# Patient Record
Sex: Female | Born: 1960
Health system: Southern US, Community
[De-identification: ages and names within clinical notes are randomized; demographics above are authoritative.]

## PROBLEM LIST (undated history)

## (undated) ENCOUNTER — Emergency Department (HOSPITAL_COMMUNITY): Payer: Self-pay

## (undated) DIAGNOSIS — G8929 Other chronic pain: Secondary | ICD-10-CM

## (undated) DIAGNOSIS — F319 Bipolar disorder, unspecified: Secondary | ICD-10-CM

## (undated) DIAGNOSIS — J449 Chronic obstructive pulmonary disease, unspecified: Secondary | ICD-10-CM

## (undated) DIAGNOSIS — G4730 Sleep apnea, unspecified: Secondary | ICD-10-CM

## (undated) DIAGNOSIS — E039 Hypothyroidism, unspecified: Secondary | ICD-10-CM

## (undated) DIAGNOSIS — M1990 Unspecified osteoarthritis, unspecified site: Secondary | ICD-10-CM

## (undated) DIAGNOSIS — F32A Depression, unspecified: Secondary | ICD-10-CM

## (undated) DIAGNOSIS — Z973 Presence of spectacles and contact lenses: Secondary | ICD-10-CM

## (undated) DIAGNOSIS — N644 Mastodynia: Secondary | ICD-10-CM

## (undated) DIAGNOSIS — A498 Other bacterial infections of unspecified site: Secondary | ICD-10-CM

## (undated) DIAGNOSIS — E079 Disorder of thyroid, unspecified: Secondary | ICD-10-CM

## (undated) DIAGNOSIS — G629 Polyneuropathy, unspecified: Secondary | ICD-10-CM

## (undated) DIAGNOSIS — M5430 Sciatica, unspecified side: Secondary | ICD-10-CM

## (undated) DIAGNOSIS — Z96659 Presence of unspecified artificial knee joint: Secondary | ICD-10-CM

## (undated) DIAGNOSIS — F419 Anxiety disorder, unspecified: Secondary | ICD-10-CM

## (undated) DIAGNOSIS — F329 Major depressive disorder, single episode, unspecified: Secondary | ICD-10-CM

## (undated) DIAGNOSIS — R112 Nausea with vomiting, unspecified: Secondary | ICD-10-CM

## (undated) DIAGNOSIS — N6452 Nipple discharge: Secondary | ICD-10-CM

## (undated) DIAGNOSIS — R509 Fever, unspecified: Secondary | ICD-10-CM

## (undated) DIAGNOSIS — K219 Gastro-esophageal reflux disease without esophagitis: Secondary | ICD-10-CM

## (undated) DIAGNOSIS — I499 Cardiac arrhythmia, unspecified: Secondary | ICD-10-CM

## (undated) DIAGNOSIS — Z87442 Personal history of urinary calculi: Secondary | ICD-10-CM

## (undated) DIAGNOSIS — J189 Pneumonia, unspecified organism: Secondary | ICD-10-CM

## (undated) DIAGNOSIS — I10 Essential (primary) hypertension: Secondary | ICD-10-CM

## (undated) DIAGNOSIS — N63 Unspecified lump in unspecified breast: Secondary | ICD-10-CM

## (undated) DIAGNOSIS — Z87898 Personal history of other specified conditions: Secondary | ICD-10-CM

## (undated) HISTORY — DX: Depression, unspecified: F32.A

## (undated) HISTORY — DX: Disorder of thyroid, unspecified: E07.9

## (undated) HISTORY — DX: Essential (primary) hypertension: I10

## (undated) HISTORY — PX: APPENDECTOMY: SHX54

## (undated) HISTORY — DX: Bipolar disorder, unspecified: F31.9

## (undated) HISTORY — PX: ABDOMINAL HYSTERECTOMY: SHX81

## (undated) HISTORY — PX: BREAST EXCISIONAL BIOPSY: SUR124

## (undated) HISTORY — DX: Unspecified lump in unspecified breast: N63.0

## (undated) HISTORY — PX: JOINT REPLACEMENT: SHX530

## (undated) HISTORY — DX: Fever, unspecified: R50.9

## (undated) HISTORY — PX: KNEE SURGERY: SHX244

## (undated) HISTORY — DX: Nipple discharge: N64.52

## (undated) HISTORY — DX: Major depressive disorder, single episode, unspecified: F32.9

## (undated) HISTORY — PX: CARDIAC CATHETERIZATION: SHX172

## (undated) HISTORY — DX: Mastodynia: N64.4

## (undated) HISTORY — DX: Sleep apnea, unspecified: G47.30

## (undated) HISTORY — DX: Unspecified osteoarthritis, unspecified site: M19.90

## (undated) HISTORY — PX: BREAST LUMPECTOMY: SHX2

## (undated) HISTORY — PX: MOUTH SURGERY: SHX715

## (undated) HISTORY — PX: HERNIA REPAIR: SHX51

---

## 1987-06-15 HISTORY — PX: MYOMECTOMY ABDOMINAL APPROACH: SUR870

## 2005-04-12 ENCOUNTER — Ambulatory Visit: Payer: Self-pay | Admitting: Family Medicine

## 2006-01-24 ENCOUNTER — Encounter: Admission: RE | Admit: 2006-01-24 | Discharge: 2006-01-24 | Payer: Self-pay | Admitting: Obstetrics & Gynecology

## 2006-02-18 ENCOUNTER — Ambulatory Visit: Payer: Self-pay | Admitting: Family Medicine

## 2006-04-10 ENCOUNTER — Emergency Department (HOSPITAL_COMMUNITY): Admission: EM | Admit: 2006-04-10 | Discharge: 2006-04-10 | Payer: Self-pay | Admitting: Emergency Medicine

## 2006-04-15 ENCOUNTER — Ambulatory Visit: Payer: Self-pay | Admitting: Family Medicine

## 2006-05-17 ENCOUNTER — Ambulatory Visit: Payer: Self-pay | Admitting: Family Medicine

## 2006-06-18 ENCOUNTER — Emergency Department (HOSPITAL_COMMUNITY): Admission: EM | Admit: 2006-06-18 | Discharge: 2006-06-19 | Payer: Self-pay | Admitting: Emergency Medicine

## 2006-07-07 ENCOUNTER — Emergency Department (HOSPITAL_COMMUNITY): Admission: EM | Admit: 2006-07-07 | Discharge: 2006-07-07 | Payer: Self-pay | Admitting: Emergency Medicine

## 2006-10-17 ENCOUNTER — Other Ambulatory Visit: Admission: RE | Admit: 2006-10-17 | Discharge: 2006-10-17 | Payer: Self-pay | Admitting: *Deleted

## 2006-11-07 ENCOUNTER — Inpatient Hospital Stay (HOSPITAL_COMMUNITY): Admission: EM | Admit: 2006-11-07 | Discharge: 2006-11-08 | Payer: Self-pay | Admitting: Emergency Medicine

## 2007-09-07 ENCOUNTER — Encounter: Admission: RE | Admit: 2007-09-07 | Discharge: 2007-09-07 | Payer: Self-pay | Admitting: Family Medicine

## 2007-10-31 ENCOUNTER — Emergency Department (HOSPITAL_COMMUNITY): Admission: EM | Admit: 2007-10-31 | Discharge: 2007-10-31 | Payer: Self-pay | Admitting: Emergency Medicine

## 2008-04-28 ENCOUNTER — Inpatient Hospital Stay (HOSPITAL_COMMUNITY): Admission: EM | Admit: 2008-04-28 | Discharge: 2008-04-28 | Payer: Self-pay | Admitting: *Deleted

## 2008-04-28 ENCOUNTER — Ambulatory Visit: Payer: Self-pay | Admitting: Internal Medicine

## 2008-07-04 ENCOUNTER — Emergency Department (HOSPITAL_COMMUNITY): Admission: EM | Admit: 2008-07-04 | Discharge: 2008-07-05 | Payer: Self-pay | Admitting: Emergency Medicine

## 2008-08-13 ENCOUNTER — Ambulatory Visit (HOSPITAL_COMMUNITY): Admission: RE | Admit: 2008-08-13 | Discharge: 2008-08-13 | Payer: Self-pay | Admitting: Orthopedic Surgery

## 2008-09-03 ENCOUNTER — Emergency Department (HOSPITAL_COMMUNITY): Admission: EM | Admit: 2008-09-03 | Discharge: 2008-09-03 | Payer: Self-pay | Admitting: Emergency Medicine

## 2008-09-21 ENCOUNTER — Emergency Department (HOSPITAL_COMMUNITY): Admission: EM | Admit: 2008-09-21 | Discharge: 2008-09-21 | Payer: Self-pay | Admitting: Internal Medicine

## 2008-10-08 ENCOUNTER — Inpatient Hospital Stay (HOSPITAL_COMMUNITY): Admission: RE | Admit: 2008-10-08 | Discharge: 2008-10-11 | Payer: Self-pay | Admitting: Orthopedic Surgery

## 2008-12-05 ENCOUNTER — Encounter: Admission: RE | Admit: 2008-12-05 | Discharge: 2009-02-27 | Payer: Self-pay | Admitting: Orthopedic Surgery

## 2009-08-18 ENCOUNTER — Emergency Department (HOSPITAL_COMMUNITY): Admission: EM | Admit: 2009-08-18 | Discharge: 2009-08-18 | Payer: Self-pay | Admitting: Family Medicine

## 2009-10-30 ENCOUNTER — Ambulatory Visit (HOSPITAL_COMMUNITY)
Admission: RE | Admit: 2009-10-30 | Discharge: 2009-10-30 | Payer: Self-pay | Source: Home / Self Care | Admitting: Gastroenterology

## 2009-11-18 ENCOUNTER — Encounter: Admission: RE | Admit: 2009-11-18 | Discharge: 2009-11-18 | Payer: Self-pay | Admitting: Family Medicine

## 2009-12-16 ENCOUNTER — Emergency Department (HOSPITAL_COMMUNITY): Admission: EM | Admit: 2009-12-16 | Discharge: 2009-12-16 | Payer: Self-pay | Admitting: Family Medicine

## 2010-03-19 ENCOUNTER — Encounter
Admission: RE | Admit: 2010-03-19 | Discharge: 2010-05-27 | Payer: Self-pay | Source: Home / Self Care | Attending: Podiatry | Admitting: Podiatry

## 2010-05-21 ENCOUNTER — Emergency Department (HOSPITAL_COMMUNITY)
Admission: EM | Admit: 2010-05-21 | Discharge: 2010-05-21 | Payer: Self-pay | Source: Home / Self Care | Admitting: Emergency Medicine

## 2010-08-04 ENCOUNTER — Other Ambulatory Visit (HOSPITAL_COMMUNITY): Payer: Self-pay | Admitting: Orthopedic Surgery

## 2010-08-04 DIAGNOSIS — M25561 Pain in right knee: Secondary | ICD-10-CM

## 2010-08-10 ENCOUNTER — Ambulatory Visit (HOSPITAL_COMMUNITY)
Admission: RE | Admit: 2010-08-10 | Discharge: 2010-08-10 | Disposition: A | Discharge status: Home / Self Care | Payer: MEDICARE | Source: Ambulatory Visit | Attending: Orthopedic Surgery | Admitting: Orthopedic Surgery

## 2010-08-10 ENCOUNTER — Encounter (HOSPITAL_COMMUNITY)
Admission: RE | Admit: 2010-08-10 | Discharge: 2010-08-10 | Disposition: A | Discharge status: Home / Self Care | Payer: MEDICARE | Source: Ambulatory Visit | Attending: Orthopedic Surgery | Admitting: Orthopedic Surgery

## 2010-08-10 ENCOUNTER — Encounter (HOSPITAL_COMMUNITY): Payer: Self-pay

## 2010-08-10 DIAGNOSIS — M25561 Pain in right knee: Secondary | ICD-10-CM

## 2010-08-10 DIAGNOSIS — Z96659 Presence of unspecified artificial knee joint: Secondary | ICD-10-CM | POA: Insufficient documentation

## 2010-08-10 HISTORY — DX: Presence of unspecified artificial knee joint: Z96.659

## 2010-08-10 MED ORDER — TECHNETIUM TC 99M MEDRONATE IV KIT
24.0000 | PACK | Freq: Once | INTRAVENOUS | Status: AC | PRN
  Administered 2010-08-10: 24 via INTRAVENOUS

## 2010-08-25 LAB — GLUCOSE, CAPILLARY: Glucose-Capillary: 128 mg/dL — ABNORMAL HIGH (ref 70–99)

## 2010-08-31 LAB — GLUCOSE, CAPILLARY: Glucose-Capillary: 82 mg/dL (ref 70–99)

## 2010-09-23 LAB — CULTURE, ROUTINE-ABSCESS

## 2010-09-23 LAB — GLUCOSE, CAPILLARY
Glucose-Capillary: 113 mg/dL — ABNORMAL HIGH (ref 70–99)
Glucose-Capillary: 121 mg/dL — ABNORMAL HIGH (ref 70–99)
Glucose-Capillary: 127 mg/dL — ABNORMAL HIGH (ref 70–99)
Glucose-Capillary: 133 mg/dL — ABNORMAL HIGH (ref 70–99)
Glucose-Capillary: 134 mg/dL — ABNORMAL HIGH (ref 70–99)
Glucose-Capillary: 134 mg/dL — ABNORMAL HIGH (ref 70–99)
Glucose-Capillary: 145 mg/dL — ABNORMAL HIGH (ref 70–99)

## 2010-09-23 LAB — URINALYSIS, ROUTINE W REFLEX MICROSCOPIC
Bilirubin Urine: NEGATIVE
Glucose, UA: NEGATIVE mg/dL
Hgb urine dipstick: NEGATIVE
Ketones, ur: 15 mg/dL — AB
Nitrite: NEGATIVE
Protein, ur: NEGATIVE mg/dL
Specific Gravity, Urine: 1.028 (ref 1.005–1.030)
Urobilinogen, UA: 1 mg/dL (ref 0.0–1.0)
pH: 5.5 (ref 5.0–8.0)

## 2010-09-23 LAB — BASIC METABOLIC PANEL
BUN: 4 mg/dL — ABNORMAL LOW (ref 6–23)
BUN: 6 mg/dL (ref 6–23)
BUN: 7 mg/dL (ref 6–23)
BUN: 8 mg/dL (ref 6–23)
CO2: 21 mEq/L (ref 19–32)
CO2: 23 mEq/L (ref 19–32)
CO2: 26 mEq/L (ref 19–32)
CO2: 29 mEq/L (ref 19–32)
Calcium: 8 mg/dL — ABNORMAL LOW (ref 8.4–10.5)
Calcium: 8.4 mg/dL (ref 8.4–10.5)
Calcium: 8.6 mg/dL (ref 8.4–10.5)
Calcium: 9.2 mg/dL (ref 8.4–10.5)
Chloride: 100 mEq/L (ref 96–112)
Chloride: 102 mEq/L (ref 96–112)
Chloride: 104 mEq/L (ref 96–112)
Chloride: 106 mEq/L (ref 96–112)
Creatinine, Ser: 0.89 mg/dL (ref 0.4–1.2)
Creatinine, Ser: 1.02 mg/dL (ref 0.4–1.2)
Creatinine, Ser: 1.03 mg/dL (ref 0.4–1.2)
Creatinine, Ser: 1.22 mg/dL — ABNORMAL HIGH (ref 0.4–1.2)
GFR calc Af Amer: 57 mL/min — ABNORMAL LOW (ref >60)
GFR calc Af Amer: >60 mL/min (ref >60)
GFR calc Af Amer: >60 mL/min (ref >60)
GFR calc Af Amer: >60 mL/min (ref >60)
GFR calc non Af Amer: 47 mL/min — ABNORMAL LOW (ref >60)
GFR calc non Af Amer: 57 mL/min — ABNORMAL LOW (ref >60)
GFR calc non Af Amer: 58 mL/min — ABNORMAL LOW (ref >60)
GFR calc non Af Amer: >60 mL/min (ref >60)
Glucose, Bld: 115 mg/dL — ABNORMAL HIGH (ref 70–99)
Glucose, Bld: 123 mg/dL — ABNORMAL HIGH (ref 70–99)
Glucose, Bld: 123 mg/dL — ABNORMAL HIGH (ref 70–99)
Glucose, Bld: 89 mg/dL (ref 70–99)
Potassium: 3.2 mEq/L — ABNORMAL LOW (ref 3.5–5.1)
Potassium: 3.2 mEq/L — ABNORMAL LOW (ref 3.5–5.1)
Potassium: 3.3 mEq/L — ABNORMAL LOW (ref 3.5–5.1)
Potassium: 4.4 mEq/L (ref 3.5–5.1)
Sodium: 132 mEq/L — ABNORMAL LOW (ref 135–145)
Sodium: 135 mEq/L (ref 135–145)
Sodium: 137 mEq/L (ref 135–145)
Sodium: 139 mEq/L (ref 135–145)

## 2010-09-23 LAB — URINE MICROSCOPIC-ADD ON

## 2010-09-23 LAB — CBC
HCT: 30.3 % — ABNORMAL LOW (ref 36.0–46.0)
HCT: 31.9 % — ABNORMAL LOW (ref 36.0–46.0)
HCT: 34.9 % — ABNORMAL LOW (ref 36.0–46.0)
HCT: 41.1 % (ref 36.0–46.0)
Hemoglobin: 10.1 g/dL — ABNORMAL LOW (ref 12.0–15.0)
Hemoglobin: 10.7 g/dL — ABNORMAL LOW (ref 12.0–15.0)
Hemoglobin: 11.3 g/dL — ABNORMAL LOW (ref 12.0–15.0)
Hemoglobin: 13.7 g/dL (ref 12.0–15.0)
MCHC: 32.3 g/dL (ref 30.0–36.0)
MCHC: 33.3 g/dL (ref 30.0–36.0)
MCHC: 33.5 g/dL (ref 30.0–36.0)
MCHC: 33.7 g/dL (ref 30.0–36.0)
MCV: 81.3 fL (ref 78.0–100.0)
MCV: 81.4 fL (ref 78.0–100.0)
MCV: 81.8 fL (ref 78.0–100.0)
MCV: 82.1 fL (ref 78.0–100.0)
Platelets: 250 10*3/uL (ref 150–400)
Platelets: 251 10*3/uL (ref 150–400)
Platelets: 288 10*3/uL (ref 150–400)
Platelets: 329 10*3/uL (ref 150–400)
RBC: 3.72 MIL/uL — ABNORMAL LOW (ref 3.87–5.11)
RBC: 3.92 MIL/uL (ref 3.87–5.11)
RBC: 4.25 MIL/uL (ref 3.87–5.11)
RBC: 5.02 MIL/uL (ref 3.87–5.11)
RDW: 16.4 % — ABNORMAL HIGH (ref 11.5–15.5)
RDW: 16.6 % — ABNORMAL HIGH (ref 11.5–15.5)
RDW: 16.6 % — ABNORMAL HIGH (ref 11.5–15.5)
RDW: 16.9 % — ABNORMAL HIGH (ref 11.5–15.5)
WBC: 6 10*3/uL (ref 4.0–10.5)
WBC: 6.1 10*3/uL (ref 4.0–10.5)
WBC: 6.5 10*3/uL (ref 4.0–10.5)
WBC: 7.1 10*3/uL (ref 4.0–10.5)

## 2010-09-23 LAB — PROTIME-INR
INR: 1 (ref 0.00–1.49)
INR: 1.1 (ref 0.00–1.49)
INR: 1.6 — ABNORMAL HIGH (ref 0.00–1.49)
INR: 1.9 — ABNORMAL HIGH (ref 0.00–1.49)
Prothrombin Time: 13.1 seconds (ref 11.6–15.2)
Prothrombin Time: 14.6 seconds (ref 11.6–15.2)
Prothrombin Time: 19.3 seconds — ABNORMAL HIGH (ref 11.6–15.2)
Prothrombin Time: 23.1 seconds — ABNORMAL HIGH (ref 11.6–15.2)

## 2010-09-23 LAB — NO BLOOD PRODUCTS

## 2010-09-23 LAB — APTT: aPTT: 30 seconds (ref 24–37)

## 2010-09-24 LAB — CBC
HCT: 42 % (ref 36.0–46.0)
Hemoglobin: 14.2 g/dL (ref 12.0–15.0)
MCHC: 33.9 g/dL (ref 30.0–36.0)
MCV: 79.7 fL (ref 78.0–100.0)
Platelets: 344 10*3/uL (ref 150–400)
RBC: 5.26 MIL/uL — ABNORMAL HIGH (ref 3.87–5.11)
RDW: 16.8 % — ABNORMAL HIGH (ref 11.5–15.5)
WBC: 6.6 10*3/uL (ref 4.0–10.5)

## 2010-09-24 LAB — BASIC METABOLIC PANEL
BUN: 4 mg/dL — ABNORMAL LOW (ref 6–23)
CO2: 27 mEq/L (ref 19–32)
Calcium: 9.9 mg/dL (ref 8.4–10.5)
Chloride: 102 mEq/L (ref 96–112)
Creatinine, Ser: 0.94 mg/dL (ref 0.4–1.2)
GFR calc Af Amer: >60 mL/min (ref >60)
GFR calc non Af Amer: >60 mL/min (ref >60)
Glucose, Bld: 116 mg/dL — ABNORMAL HIGH (ref 70–99)
Potassium: 3.8 mEq/L (ref 3.5–5.1)
Sodium: 136 mEq/L (ref 135–145)

## 2010-09-24 LAB — NO BLOOD PRODUCTS

## 2010-09-28 LAB — GLUCOSE, CAPILLARY: Glucose-Capillary: 118 mg/dL — ABNORMAL HIGH (ref 70–99)

## 2010-10-27 NOTE — H&P (Signed)
Caroline Williams, Caroline Williams NO.:  [id number]   MEDICAL RECORD NO.:  [id number]          PATIENT TYPE:  EMS   LOCATION:  MAJO                         FACILITY:  [facility]   PHYSICIAN:  Georgina Quint. Plotnikov, MDDATE OF BIRTH:  06/07/1961   DATE OF ADMISSION:  04/27/2008  DATE OF DISCHARGE:                              HISTORY & PHYSICAL   CHIEF COMPLAINT:  Chest pain.   HISTORY OF PRESENT ILLNESS:  The patient is a 50 year old morbidly obese  female with multiple medical problems who presents with chest pain in  the middle of her chest, it started around 2 p.m. and was rated at 4/10,  pressure-like, later reduced to 1/10.  She took nitroglycerin three  times with some improvement.  She is comfortable in sleep and when I  approached to examine her.  She had a heart catheterization about a year  ago.  It was normal per patient.   PAST MEDICAL HISTORY:  Diabetes, asthma, dyslipidemia, hypertension,  hypothyroid, GERD, mental illness.   SOCIAL HISTORY:  She is a smoker.   FAMILY HISTORY:  Positive for diabetes.   CURRENT MEDICATIONS:  Ibuprofen, simvastatin, Lexapro, BuSpar, Invega,  lisinopril, benztropine, diazepam, Colace, meloxicam, atenolol,  carbidopa/levodopa, levothyroxine, Vicodin, Capadexm, Rozerem,  isosorbide, metformin, sublingual nitroglycerin.   ALLERGIES:  ASPIRIN.   REVIEW OF SYSTEMS:  No angina.  Occasional indigestion, occasional  arthritis.  Snoring, possible sleep apnea.  The rest of the 18-point  review of systems is as above or negative.   PHYSICAL EXAMINATION:  VITAL SIGNS:  Blood pressure 125/73, heart rate  67, respiration 16, temp 98.2, sats 99% on room air.  GENERAL:  She is morbidly obese.  HEENT:  With small oropharynx.  NECK:  Supple.  No thyromegaly.  LUNGS:  Decreased breath sounds.  HEART:  With distant tones.  ABDOMEN:  Obese, soft, nontender.  LOWER EXTREMITIES:  With trace edema.  Pulses nonpalpable.  NEUROLOGIC:  She is  somnolent, alert, cooperative, oriented.  Cranial  nerves II through XII grossly nonfocal.  DTRs and muscle strength within  normal limits.  SKIN:  Without rashes.   LABORATORY DATA:  White count 6.1, hemoglobin 12.4, MCV 82, platelets  304.  Myoglobin, CK-MB, and troponin all negative.  INR 0.9, sodium 134,  potassium 3.4, glucose 110, creatinine 0.89, BNP 47.  Chest x-ray with  cardiomegaly and vascular congestion.  EKG with sinus bradycardia, first-  degree AV block.   ASSESSMENT AND PLAN:  1. Chest pain atypical, rule out myocardial infarction.  ER physician      has discussed the case with Dr. Katrinka Blazing and was advised to admit to      rule out.  2. Possible coronary spasm versus nonobstructive disease.  She is      status post heart catheterization about a year ago.  She has been      on nitrates.  3. Possible sleep apnea, not on therapy.  May need a sleep study.  4. Morbid obesity.  5. Type 2 diabetes.  6. Hypertension.  7. Anxiety.  8. Hypokalemia.  We will replace.  9. Hypothyroidism,  on therapy.      Georgina Quint. Plotnikov, MD  Electronically Signed     AVP/MEDQ  D:  04/28/2008  T:  04/28/2008  Job:  [id number]   cc:   Dr. Katrinka Blazing  Ms. Clair Gulling

## 2010-10-27 NOTE — Discharge Summary (Signed)
[name], Caroline Williams NO.:  [id number]   MEDICAL RECORD NO.:  [id number]          PATIENT TYPE:  INP   LOCATION:  4705                         FACILITY:  [facility]   PHYSICIAN:  Lyn Records, M.D.   DATE OF BIRTH:  1960-12-10   DATE OF ADMISSION:  DATE OF DISCHARGE:  11/08/2006                               DISCHARGE SUMMARY   DISCHARGE DIAGNOSES:  1. Chest pain, felt to be noncardiac in nature.  2. Hypertension.  3. Asthma.  4. Hypothyroidism, treated.  5. Anxiety/depression.  6. Long-term medication use.   Caroline Williams is a 50 year old female who was admitted to [facility] on Nov 07, 2006 with acute onset of left-sided anterior chest  pain.  Pain relieved by sublingual nitroglycerin.  She was then set up  for cardiac catheterization on Nov 08, 2006, but the study revealed  normal coronaries, EF 60%.  We discharged her to home to follow up with  our Group unless she has any problems with her groin/cath site.  She is  to follow up with her primary care physician, Dr. Particia Jasper at [facility].  Our concerns now are focused more in the gastric area.   LABORATORY STUDIES:  Hemoglobin 12.4, hematocrit 37.9, white count 5.9,  platelets 309, sodium 137, potassium 3.6, BUN 12, creatinine 0.94, pro  time 13.0, INR 1.0.  CK-MB x2 negative.  Troponin x2 negative.  BMP less  than 30.   Patient was discharged to home on the same medications she was on when  she came to the hospital:  Invaga 6 mg a day, Synthroid 100 mcg a day,  Norvasc 10 mg a day, Wellbutrin 150 mg a day, Celebrex p.r.n. as prior  to admission, Xanax as needed prior to admission, hydroxyzine as needed  prior to admission.   She is to call her primary physician for an office visit in 1 week for  further followup.  No lifting over 10 pounds for 1 week.  No driving for  2 days.  Increase activity slowly.  Call if she has any concerns of the  cath/wound site.      Guy Franco,  P.A.      Lyn Records, M.D.  Electronically Signed    LB/MEDQ  D:  11/08/2006  T:  11/08/2006  Job:  [id number]   cc:   Dr. Particia Jasper

## 2010-10-27 NOTE — Op Note (Signed)
NAMECATERINA, Caroline Williams NO.:  [id number]   MEDICAL RECORD NO.:  [id number]          PATIENT TYPE:  INP   LOCATION:  5034                         FACILITY:  [facility]   PHYSICIAN:  Eulas Post, MD    DATE OF BIRTH:  1960-11-11   DATE OF PROCEDURE:  10/08/2008  DATE OF DISCHARGE:  09/21/2008                               OPERATIVE REPORT   FIRST ASSISTANT:  Skip Mayer, PA-C   PREOPERATIVE DIAGNOSIS:  Right knee osteoarthritis.   POSTOPERATIVE DIAGNOSIS:  Right knee osteoarthritis.   OPERATIVE PROCEDURE:  Right total knee arthroplasty.   ANESTHESIA:  General with a femoral nerve block.   TOURNIQUET TIME:  2 hours at 350 mmHg.   ANTIBIOTICS:  2 g of intravenous Ancef given preoperatively.   OPERATIVE IMPLANTS:  DePuy PFC Sigma posterior stabilized size 2.5 right  femur with a size 2.5 cemented tibial tray with a size 2.5 x 10 mm  rotating platform polyethylene insert and SmartSet HV bone cement x2  bags with a 38-mm oval dome 3 peg patellar button.   PREOPERATIVE INDICATIONS:  [name] is a 50 year old woman, who  has morbid obesity and right end-stage degenerative knee pain.  She  elected to undergo the above-named procedures.  She had failed  conservative management including bracing, therapy, and injections.  The  risks, benefits, and alternatives were discussed with her preoperatively  including but not limited to risks of infection, bleeding, nerve injury,  mal alignment, the need for revision total knee arthroplasty, blood  clots, stiffness, cardiopulmonary complications, among others, and she  is willing to proceed.   OPERATIVE FINDINGS:  There was advanced osteophyte formation around the  patella.  There was also grade 3 changes on the patella.  There was a  small pitted lesion on the lateral femoral condyle, although the lateral  femoral condyle was reasonably well maintained.  The medial femoral  condyle and the trochlea had grade 4  changes.  These were fairly  extensive.  The tibial plateau on the medial side also had grade 4  changes.  The ACL and PCL were intact.   OPERATIVE PROCEDURE:  The patient was brought to the operating room and  placed in supine position.  Femoral nerve block had been administered in  preop.  Intravenous Ancef 2 g was given.  The patient underwent general  anesthesia.  The right lower extremity was prepped and draped in the  usual sterile fashion.  Anterior incision was made and a mid vastus  muscle splitting approach was performed.  All osteophytes were removed.  The menisci were excised.  We removed the fat pad superior to the  articular cartilage in the knee.  We then opened the distal femur and  placed our jig resecting 11 mm with 5 degrees right.  We then performed  our tibial cut.  Extramedullary tibial cutting guide was utilized.  Care  was taken to protect the medial and lateral collateral ligaments.  We  resected approximately 4 mm off the medial side.  The menisci were  resected posteriorly.  The ACL and PCL were  sacrificed.  The femoral  sizing jig was then applied and the femur sized to size 2.5.  The jig  was applied in the appropriate external rotation.  A 4-in-1 cutting  block was also applied and we resected anteroposteriorly our chamfer  cuts.  We then placed our box cutting jig and centered it on the femur.  We then turned our attention to the patella.   The patella measured 24 mm, and we resected to 15 mm.  The jig was  utilized.  We then drilled our lug holes.  It was sized to a 38.  We  then placed all of our trial components and prepared our proximal tibia.  The components had excellent fit and our flexion and extension gaps were  equal at 10 mm.  We then irrigated the knee copiously and prepared our  cement.   We cemented our tibia and femur and patella into place and placed our  polyethylene.  All excess cement was removed.  The tourniquet was  maintained  through the entirety of the case given her wishes as a  Jehovah's Witness, and we minimized blood loss.  After the cement had  cured, we irrigated the knee once more and closed the parapatellar  tissue with #1 Vicryl followed by 2-0 and 3-0 for subcutaneous tissue,  and a 4-0 Monocryl with Steri-Strips and sterile gauze for the skin.  The patient was awakened and returned to PACU in stable and satisfactory  condition.  She had excellent stability and range of motion was from 0  degrees to 115 degrees on the table.  The patient tolerated the  procedure well.  There were no complications.      Eulas Post, MD  Electronically Signed     JPL/MEDQ  D:  10/08/2008  T:  10/08/2008  Job:  [id number]

## 2010-10-27 NOTE — Discharge Summary (Signed)
Caroline Williams, Caroline Williams NO.:  [id number]   MEDICAL RECORD NO.:  [id number]          PATIENT TYPE:  INP   LOCATION:  5034                         FACILITY:  [facility]   PHYSICIAN:  Eulas Post, MD    DATE OF BIRTH:  11/27/1960   DATE OF ADMISSION:  10/08/2008  DATE OF DISCHARGE:  10/11/2008                               DISCHARGE SUMMARY   ADMISSION DIAGNOSIS:  Right knee osteoarthritis.   DISCHARGE DIAGNOSIS:  Right knee osteoarthritis.   OPERATIVE PROCEDURE:  Right total knee arthroplasty.   DISCHARGE MEDICATIONS:  Coumadin, Phenergan, Valium, Dilaudid, Percocet.   HOSPITAL COURSE:  [name] is a 50 year old woman who is  morbidly obese and had a varus knee with osteoarthritis and elected to  undergo the above-named procedures.  She tolerated the procedure well  and postoperatively did not have any complications.  She is Jehovah's  Witness and did not receive any blood products.  Her  hemoglobin/hematocrit remained stable through her hospital stay.  She  was given a PCA morphine for pain control early and then subsequently  switched to p.o. and IV analgesics.  She was on Percocet for an extended  period of time prior to the surgery, and therefore I am providing her  with Dilaudid just in case.  Her potassium was slightly low at 3.2, but  was stabilizing.  She was given Coumadin as well as sequential  compression devices for DVT prophylaxis.  She was given perioperative  Ancef for antimicrobial prophylaxis.  She was doing well at the time of  discharge and passed physical therapy and discharged home with followup  with me in 2 weeks.  The dressings were changed on postoperative day #3  and her wounds were clean.  There were no complications.  The patient  tolerated procedure well.      Eulas Post, MD  Electronically Signed     JPL/MEDQ  D:  10/11/2008  T:  10/11/2008  Job:  [id number]

## 2010-10-27 NOTE — Assessment & Plan Note (Signed)
[facility]   NAME:  Caroline Williams, Caroline Williams               ACCOUNT NO.:  [id number]   MEDICAL RECORD NO.:  [id number]      DATE OF BIRTH:  11/28/1960   PHYSICIAN:  Corinna L. Lendell Caprice, MD      VISIT DATE:                                   OFFICE VISIT   DISCHARGE DIAGNOSES:  1. Chest pain, suspect gastrointestinal in origin.  2. Morbid obesity.  3. Suspect obstructive sleep apnea, needs outpatient polysomnogram.  4. Diabetes.  5. Dyslipidemia.  6. Hypertension.  7. Hypothyroidism.  8. Reported gastroesophageal reflux disease.   DISCHARGE MEDICATIONS:  I have started her on Prilosec 20 mg a day.  She  may need EGD or other workup if she continues to have pain.  She may  continue the rest of her outpatient medications.  Please see H and P for  details, medicine reconciliation form has not been filled by nursing  staff.  Follow up Christiana Fuchs in 2 weeks.   CONDITION:  Stable.   ACTIVITY:  Ad lib.  She also should have an outpatient polysomnogram as  previously stated.   DIET:  Should be diabetic, low calorie, low salt.  She should be  encouraged to start an exercise program for her morbid obesity and  resulting multiple medical problems.   CONSULTATIONS:  None.   PROCEDURES:  None.   LABS:  Serial cardiac enzymes negative.  Basic metabolic panel  unremarkable.  PT/PTT normal.  CBC unremarkable.  BNP 47.   SPECIAL STUDIES/RADIOLOGY:  EKG is not currently on the chart but  supposedly should sinus bradycardia with 1st-degree AV block according  to H and P.  Chest x-ray showed cardiomegaly and vascular congestion.   HISTORY AND HOSPITAL COURSE:  Briefly, [name] is a 50 year old black  female with multiple medical problems who had a negative cardiac  catheterization last year and presented with sudden onset chest pain at  2 in the morning.  She ruled out for MI.  She had no further chest pain.  I noted that she was on nonsteroidal antiinflammatories and  COX-2  inhibitors per the H and P and has a reported history of  gastroesophageal reflux disease but I do not see that she is on a proton  pump inhibitor.  I have therefore started her on Prilosec and asked her  to follow up with her primary care nurse practitioner/physician for  further workup if she continues to have pain.  I do not think this is  cardiac.   She was noted to be very somnolent, sleepy, difficult to arouse and I  suspect due to her body habitus and these other factors I suspect she  has sleep apnea and will need outpatient followup.      Corinna L. Lendell Caprice, MD  Electronically Signed     CLS/MEDQ  D:  04/28/2008  T:  04/28/2008  Job:  [id number]   cc:   Lyn Records, M.D.  Lavonda Jumbo, M.D.  Christiana Fuchs, N.P.

## 2010-10-27 NOTE — H&P (Signed)
NAMEJERNEE, Caroline Williams NO.:  [id number]   MEDICAL RECORD NO.:  [id number]          PATIENT TYPE:  INP   LOCATION:  1843                         FACILITY:  [facility]   PHYSICIAN:  Georga Hacking, M.D.DATE OF BIRTH:  06-Sep-1960   DATE OF ADMISSION:  11/07/2006  DATE OF DISCHARGE:                              HISTORY & PHYSICAL   REASON FOR ADMISSION:  Chest pain.   HISTORY:  The patient is a 50 year old black female with a history of  schizophrenic disorder, hypertension, obesity, asthma and cigarette  abuse, as well as hyperlipidemia.  She has been somewhat inactive.  She  moved here 1-1/2 years ago with her roommate from the [place], [place]  area and formerly was a native of [place].  She has been on disability  for schizophrenia and depression for 9 years.  She developed some chest  discomfort several weeks ago with left arm numbness and saw Dr. Katrinka Blazing  and had a Cardiolite stress test done with adenosine last week.  She  received a phone call from Dr. Katrinka Blazing on Friday stating that it was  abnormal and that she would need to have a catheterization.  She had the  onset of chest discomfort, described as heaviness or pressure, while  waiting at a bus stop and developed some left arm numbness and lasted  around 40 minutes.  She became somewhat weak and was brought to the  emergency room and was given nitroglycerin with diminution of the pain.  She currently complains of a minimal amount of pressure pain.  She says  that she quit smoking about 4 days ago.  She is sedentary and inactive  due to some plantar fasciitis and has been quite obese.   PAST HISTORY:  Remarkable for hypertension, hypothyroidism, asthma.  She  says she is a borderline diabetic and is obese.  She has known  schizophrenia.   PREVIOUS SURGERY:  Appendectomy, C-section, hernia repair, hysterectomy  and laparoscopic surgery.   ALLERGIES:  ASPIRIN causes difficulty breathing.   CURRENT  MEDICATIONS:  1. Hydroxyzine 25 mg q.i.d.  2. Celebrex 200 mg daily.  3. Wellbutrin XL 150 mg daily.  4. Invega 6 mg daily.  5. Norvasc 10 mg daily.  6. Levothyroxine 100 mcg daily.  7. Meloxicam 7.5 mg b.i.d.   FAMILY HISTORY:  Her parents are living.  Father is 35 with a history of  colon cancer, myocardial infarction, CVAs.  Mother is 36 with a history  of cardiomegaly.  She has 4 sisters and 2 brothers with a history of  hypertension and strokes in them.   SOCIAL HISTORY:  She is currently disabled for the past 9 years due to  schizoaffective disorder.  She has smoked 1/2 pack of cigarettes per  day, but says she has quite about 4 days ago.  She currently lives  alone, but does have a boyfriend.  Does not use alcohol to excess.  She  has 4 children that live in [place].   REVIEW OF SYSTEMS:  She has been obese for many years.  She has no skin  problems.  She  has no eye, ear nose or throat problems.  She has no  difficulty swallowing.  There is no history of ulcers or hematochezia.  She has had some urinary frequency and hesitancy.  She has no history of  stroke or TIA.  She has been treated recently for bone spurs and had  some nausea earlier today.   PHYSICAL EXAMINATION:  GENERAL:  She is a pleasant, obese, black female  weighing 273 pounds.  VITAL SIGNS:  Blood pressure is 140/80, pulse is currently 70.  SKIN:  Warm and dry.  ENT:  EOMI, PERLA.  CNS clear.  Fundi not examined.  Pharynx negative.  NECK:  Supple without masses, JVD, thyromegaly or bruits.  LUNGS:  Clear to A&P.  CARDIAC:  Normal S1 and S2, no S3.  ABDOMEN:  Soft, obese and nontender.  Femoral distal pulses are 2+.   Twelve-lead EKG is normal.   LABORATORY DATA:  Normal PT and PTT.  Chemistry panel showed a BUN of  13, creatinine of 1.1, potassium of 3.6.   IMPRESSION:  1. Chest pain consistent with unstable angina with previous abnormal      Cardiolite study.  2. Obesity.  3. Hypertension.  4.  History of schizophrenia.  5. Hypothyroidism under treatment.  6. Recent history of bone spurs with treatment with Celebrex.   RECOMMENDATIONS:  Admit to hospital, begin IV heparin and nitroglycerin,  check serial enzymes, keep n.p.o. after midnight.  Catheterization  tomorrow per Dr. Katrinka Blazing.      Georga Hacking, M.D.  Electronically Signed     WST/MEDQ  D:  11/07/2006  T:  11/07/2006  Job:  [id number]   cc:   Lyn Records, M.D.  Dr. Merri Brunette

## 2010-10-27 NOTE — Cardiovascular Report (Signed)
[name], Caroline Williams NO.:  [id number]   MEDICAL RECORD NO.:  [id number]          PATIENT TYPE:  INP   LOCATION:  4705                         FACILITY:  [facility]   PHYSICIAN:  Lyn Records, M.D.   DATE OF BIRTH:  13-May-1961   DATE OF PROCEDURE:  11/08/2006  DATE OF DISCHARGE:                            CARDIAC CATHETERIZATION   INDICATIONS FOR PROCEDURE:  Recent episodes of chest discomfort atypical  in that there are nonexertional.  Recent Cardiolite study demonstrated  an inferior reversible defect possibly due to ischemia versus diaphragm  attenuation.  She was admitted to the hospital last evening with  prolonged chest pain relieved by nitro.  The Cardiolite study and this  presentation has prompted coronary angiography to define anatomy and  help guide therapy.   PROCEDURE PERFORMED:  1. Left heart cath.  2. Selective coronary angio.  3. Left ventriculography.  4. Angio-Seal.   DESCRIPTION:  After informed consent a 6-French sheath was placed in the  right femoral artery using modified Seldinger technique.  A 6-French A2  multipurpose catheter was then used for hemodynamic recordings, left  ventriculography by hand injection, and selective right coronary  angiography.  We also attempted left coronary angiography.  This was  unsuccessful.  We performed left coronary angiography using a 6-French  #4 left Judkins catheter.  The patient tolerated the procedure.  The  sheathogram was performed and Angio-Seal was used for closure with good  hemostasis.   RESULTS:  1. Hemodynamic data:      a.     Aortic pressure 120/78.      b.     Left ventricular pressure 118/19.  2. Left ventriculography:  The left ventricle is normal in size and      demonstrates normal contractility.  EF is 60%.  3. Coronary angiography.      a.     Left main coronary:  The left main coronary artery contains       irregularities.  No high-grade obstruction is noted.      b.      Left anterior descending coronary:  The LAD wraps around the       left ventricular apex, gives origin to a large first diagonal.  No       significant obstruction is since.      c.     Circumflex artery:  The circumflex coronary artery is large.       It gives origin to four obtuse marginal branches.  The first and       third obtuse marginals are dominant.  Irregularities are noted in       the first obtuse marginal.      d.     Right coronary:  The right coronary is dominant.  No       obstruction is noted.   CONCLUSION:  1. Widely patent coronary arteries without evidence of significant      atherosclerosis.  2. Normal LV function.  3. Suspected false positive Cardiolite study.   RECOMMENDATIONS:  Further evaluation to include GI workup as an  outpatient  should be considered.  No further cardiac evaluation is felt  indicated.      Lyn Records, M.D.  Electronically Signed     HWS/MEDQ  D:  11/08/2006  T:  11/08/2006  Job:  [id number]   cc:   Elwyn Lade, M.D.

## 2010-11-06 ENCOUNTER — Inpatient Hospital Stay (INDEPENDENT_AMBULATORY_CARE_PROVIDER_SITE_OTHER)
Admission: RE | Admit: 2010-11-06 | Discharge: 2010-11-06 | Disposition: A | Discharge status: Home / Self Care | Payer: Medicaid Other | Source: Ambulatory Visit | Attending: Family Medicine | Admitting: Family Medicine

## 2010-11-06 DIAGNOSIS — S9030XA Contusion of unspecified foot, initial encounter: Secondary | ICD-10-CM

## 2010-12-25 ENCOUNTER — Other Ambulatory Visit: Payer: Self-pay | Admitting: Family Medicine

## 2010-12-28 NOTE — Telephone Encounter (Signed)
Dr. Lynelle Doctor, This was a refill request in the pool. Patient never seen here and no appt scheduled. Appears as if this patient saw you one time 11/18/2009.

## 2011-01-13 ENCOUNTER — Ambulatory Visit: Payer: Medicaid Other | Attending: Specialist | Admitting: Physical Therapy

## 2011-01-13 DIAGNOSIS — M25673 Stiffness of unspecified ankle, not elsewhere classified: Secondary | ICD-10-CM | POA: Insufficient documentation

## 2011-01-13 DIAGNOSIS — M25579 Pain in unspecified ankle and joints of unspecified foot: Secondary | ICD-10-CM | POA: Insufficient documentation

## 2011-01-13 DIAGNOSIS — M25676 Stiffness of unspecified foot, not elsewhere classified: Secondary | ICD-10-CM | POA: Insufficient documentation

## 2011-01-13 DIAGNOSIS — R262 Difficulty in walking, not elsewhere classified: Secondary | ICD-10-CM | POA: Insufficient documentation

## 2011-01-13 DIAGNOSIS — IMO0001 Reserved for inherently not codable concepts without codable children: Secondary | ICD-10-CM | POA: Insufficient documentation

## 2011-01-13 DIAGNOSIS — R5381 Other malaise: Secondary | ICD-10-CM | POA: Insufficient documentation

## 2011-01-18 ENCOUNTER — Ambulatory Visit (INDEPENDENT_AMBULATORY_CARE_PROVIDER_SITE_OTHER): Payer: PRIVATE HEALTH INSURANCE | Admitting: General Surgery

## 2011-01-18 ENCOUNTER — Encounter (INDEPENDENT_AMBULATORY_CARE_PROVIDER_SITE_OTHER): Payer: Self-pay | Admitting: General Surgery

## 2011-01-18 ENCOUNTER — Other Ambulatory Visit (INDEPENDENT_AMBULATORY_CARE_PROVIDER_SITE_OTHER): Payer: Self-pay | Admitting: General Surgery

## 2011-01-18 VITALS — BP 150/96 | HR 70 | Temp 97.8°F | Ht 65.0 in | Wt 306.0 lb

## 2011-01-18 DIAGNOSIS — N61 Mastitis without abscess: Secondary | ICD-10-CM

## 2011-01-18 DIAGNOSIS — N611 Abscess of the breast and nipple: Secondary | ICD-10-CM

## 2011-01-18 NOTE — Progress Notes (Signed)
Subjective:     Patient ID: Caroline Williams, female   DOB: December 30, 1960, 50 y.o.   MRN: [id number]  HPIPatient not previously seen by Dr. Freida Busman in October 2 00 11 for a right breast abscess fluid collection and what comes in today complaining of increasing pain in the right breast to the last week center previous breast biopsy in 25 in that area the [facility] reports that she aspirated a large amount of fluid the culture did not show any bacteria but she had a mammogram and ultrasound and this showed a thickened with what appeared to be inflammatory area. The patient refused to consider surgery at that time and the patient returns nail and on exam she's got a can of an inverted nipple on the right the right breast is tender locally slightly up towards the 3 to 4:00 position looks like as a little small incision in she's got the prominent little subareolar glans around the breast I discussed with her about aspirate and or draining the area she said to drain it and with a Betadine prep 2 cc of Xylocaine I then made a little incision about 2 cm from the central nipple area and was surprised that there is a large amount of purulent appeared drainage which we are sending for culture I will put her on Keflex I think she needs that he had excision of the central duct structure area a later time and she says she'll will get around do it sometime in the near future. I'll see what the culture shows she's got the Keflex and she was continue all her regular medications. She is allergic to Septra since would not on Septra but placed her on Keflex.  Review of Systems     Objective:   Physical ExamC. noted above drainage of subareolar abscess with about a centimeter and half incision culture sent a quarter-inch iodoform gauze placed    Assessment:    Soak in the shower starting tomorrow removing the packing the first second washed the area thoroughly see if any other drainage will come out and let me recheck her in 48 hours   Plan:

## 2011-01-18 NOTE — Patient Instructions (Signed)
Start soaking in the shower tomorrow removing the drain with the bursae washings soak the area to 3 times a day incapable Colles with in her bra to collect the drainage start the Keflex a day and you have Vicodin for pain

## 2011-01-20 ENCOUNTER — Encounter (INDEPENDENT_AMBULATORY_CARE_PROVIDER_SITE_OTHER): Payer: Self-pay | Admitting: General Surgery

## 2011-01-20 ENCOUNTER — Ambulatory Visit (INDEPENDENT_AMBULATORY_CARE_PROVIDER_SITE_OTHER): Payer: PRIVATE HEALTH INSURANCE | Admitting: General Surgery

## 2011-01-20 ENCOUNTER — Other Ambulatory Visit (INDEPENDENT_AMBULATORY_CARE_PROVIDER_SITE_OTHER): Payer: Self-pay | Admitting: General Surgery

## 2011-01-20 VITALS — BP 130/86 | HR 68 | Temp 96.6°F | Wt 303.8 lb

## 2011-01-20 DIAGNOSIS — N61 Mastitis without abscess: Secondary | ICD-10-CM

## 2011-01-20 DIAGNOSIS — N611 Abscess of the breast and nipple: Secondary | ICD-10-CM

## 2011-01-20 NOTE — Progress Notes (Signed)
Subjective:     Patient ID: Caroline Williams, female   DOB: 12-19-1960, 50 y.o.   MRN: [id number]  HPI She's 5 days following drainage of a subareolar abscess right breast drainage look frankly purulent but the cultures so far is not showing any organisms. The patient had had a previous aspiration of this area by Dr. Hessie Diener approximately 6 or 7 months ago and also grew no bacteria and it is questionable whether this is a cyst and not actually subareolar breast abscess. Her last mammogram was approximately a year ago and she will like to proceed with surgery if that will be needed so we don't allow this to recur has since done now on 2 occasions. The patient has had a previous surgery in this area for benign process done years ago. I will plan on seeing her in 2 weeks and at that time we will schedule her for mammogram and then decide whether it's necessary for surgery in the near future or not   Review of Systems     Objective:   Physical ExamOn breast exam today the little abdomen and the areola was is not having any purulent drainage and is no real mass in the subareolar area. She is chronically inverted nipples on the right and osteophyte this is probably a recurrent subareolar abscess.     Assessment:    Clinically patient is much better he will continue with his smoking and complete to Keflex I'll see her in 2 weeks.     Plan:    I will see her in 2 weeks and reexamine her and then schedule her for a mammogram. She is off the first 2 weeks in September and if surgery is needed would like to have it performed in the early days of September .

## 2011-01-20 NOTE — Patient Instructions (Signed)
Continue antibiotics until completed and continued to rewarm soaks twice a day until this no further drainage I will reexamine U. in 2 weeks and we will obtain a mammogram of left breast after that visit we will decide on whether to schedule surgery at the time of her next visit

## 2011-01-22 LAB — WOUND CULTURE
Gram Stain: NONE SEEN
Organism ID, Bacteria: NO GROWTH

## 2011-01-26 ENCOUNTER — Ambulatory Visit: Payer: Medicaid Other | Admitting: Physical Therapy

## 2011-02-02 ENCOUNTER — Encounter: Payer: Medicaid Other | Admitting: Physical Therapy

## 2011-02-09 ENCOUNTER — Encounter: Payer: Medicaid Other | Admitting: Physical Therapy

## 2011-02-11 ENCOUNTER — Ambulatory Visit: Payer: Medicaid Other | Admitting: Physical Therapy

## 2011-02-16 ENCOUNTER — Ambulatory Visit: Payer: Medicaid Other | Admitting: Physical Therapy

## 2011-02-18 ENCOUNTER — Ambulatory Visit: Payer: Medicaid Other | Attending: Specialist | Admitting: Physical Therapy

## 2011-02-18 DIAGNOSIS — M25676 Stiffness of unspecified foot, not elsewhere classified: Secondary | ICD-10-CM | POA: Insufficient documentation

## 2011-02-18 DIAGNOSIS — R262 Difficulty in walking, not elsewhere classified: Secondary | ICD-10-CM | POA: Insufficient documentation

## 2011-02-18 DIAGNOSIS — R5381 Other malaise: Secondary | ICD-10-CM | POA: Insufficient documentation

## 2011-02-18 DIAGNOSIS — M25673 Stiffness of unspecified ankle, not elsewhere classified: Secondary | ICD-10-CM | POA: Insufficient documentation

## 2011-02-18 DIAGNOSIS — M25579 Pain in unspecified ankle and joints of unspecified foot: Secondary | ICD-10-CM | POA: Insufficient documentation

## 2011-02-18 DIAGNOSIS — IMO0001 Reserved for inherently not codable concepts without codable children: Secondary | ICD-10-CM | POA: Insufficient documentation

## 2011-02-22 ENCOUNTER — Encounter (INDEPENDENT_AMBULATORY_CARE_PROVIDER_SITE_OTHER): Payer: Self-pay | Admitting: General Surgery

## 2011-02-23 ENCOUNTER — Ambulatory Visit: Payer: Medicaid Other | Admitting: Rehabilitative and Restorative Service Providers"

## 2011-02-23 ENCOUNTER — Ambulatory Visit (INDEPENDENT_AMBULATORY_CARE_PROVIDER_SITE_OTHER): Payer: PRIVATE HEALTH INSURANCE | Admitting: General Surgery

## 2011-02-23 ENCOUNTER — Encounter (INDEPENDENT_AMBULATORY_CARE_PROVIDER_SITE_OTHER): Payer: Self-pay | Admitting: General Surgery

## 2011-02-23 VITALS — BP 124/88 | HR 92 | Temp 98.8°F | Ht 65.0 in | Wt 297.6 lb

## 2011-02-23 DIAGNOSIS — N6011 Diffuse cystic mastopathy of right breast: Secondary | ICD-10-CM

## 2011-02-23 DIAGNOSIS — N6019 Diffuse cystic mastopathy of unspecified breast: Secondary | ICD-10-CM

## 2011-02-23 NOTE — Patient Instructions (Signed)
Same eighth for repeat exam in 2 months after this visit after you have had your mammogram

## 2011-02-23 NOTE — Progress Notes (Signed)
Subjective:     Patient ID: Caroline Williams, female   DOB: Dec 08, 1960, 50 y.o.   MRN: [id number]  HPIThe patient returns now approximately one month after I last saw her she had a problem of fluid accumulate in the right breast subareolar area that originally thought to be a subareolar abscess urine however there was a large amount of fluid with cultures did not show any bacteria and she's had previous cyst aspirated in that area on 2 previous occasions. One by Dr. Hessie Diener approximate one year ago and also the mammogram people prior to that she's not had a recent mammogram and I have scheduled her for a mammogram this months but the breast center his post the time until October since she's less than a year since her last procedure. She's had a partial hysterectomy and is not get in the breast tenderness off and on like to usually has previously occurred and she is 52 she is beginning to have hot flashes and is probably going and menopause   Review of Systems Current Outpatient Prescriptions  Medication Sig Dispense Refill  . ARIPiprazole (ABILIFY) 5 MG tablet Take 5 mg by mouth daily.        . BuPROPion HCl (WELLBUTRIN PO) Take 450 mg by mouth daily.        . BusPIRone HCl (BUSPAR PO) Take by mouth daily.        . Diazepam (VALIUM PO) Take by mouth daily.        Marland Kitchen levothyroxine (SYNTHROID, LEVOTHROID) 125 MCG tablet Take 125 mcg by mouth daily.        Marland Kitchen LISINOPRIL PO Take by mouth daily.        . metFORMIN (GLUMETZA) 1000 MG (MOD) 24 hr tablet Take 1,000 mg by mouth 2 (two) times daily with a meal.        . SIMVASTATIN PO Take by mouth daily.         Past Surgical History  Procedure Date  . Cesarean section   . Breast lumpectomy     right  . Hernia repair   . Appendectomy   . Abdominal hysterectomy     partial  . Knee surgery     right        Objective:   Physical ExamBP 124/88  Pulse 92  Temp(Src) 98.8 F (37.1 C) (Temporal)  Ht 5\' 5"  (1.651 m)  Wt 297 lb 9.6 oz (134.99 kg)  BMI  49.52 kg/m2  On physical examination today but limited to her breast there is no evidence of any tenderness hot warmth and a little area where I aspirated the cyst or mass previously nothing is palpable. I used the ultrasound and was surprised that there is only about a half centimeter cyst in this area at this time even though I got probably 8 or 9 cc of fluid originally in I do not find any masses or tenderness in the left breast and nothing found on ultrasound of the left breast by me   Assessment:     I think that this is probably recurrent fibrocystic changes in her right breast and not a subareolar abscess. He is scheduled for a mammogram in October and I would like to see her afterwards. She said the last time her breasts were slightly tendon was approximately 1-2 weeks ago but that the tenderness is decreasing in frequency and duration implanted 2 years earlier    Plan:    Return to see me in 2 months after her  mammogram and I doubt that surgery of any kind in the right breast will be needed with what miminal finding on examination today.

## 2011-02-25 ENCOUNTER — Encounter: Payer: Medicaid Other | Admitting: Physical Therapy

## 2011-02-26 ENCOUNTER — Inpatient Hospital Stay (INDEPENDENT_AMBULATORY_CARE_PROVIDER_SITE_OTHER)
Admission: RE | Admit: 2011-02-26 | Discharge: 2011-02-26 | Disposition: A | Discharge status: Home / Self Care | Payer: Medicaid Other | Source: Ambulatory Visit | Attending: Family Medicine | Admitting: Family Medicine

## 2011-02-26 DIAGNOSIS — K089 Disorder of teeth and supporting structures, unspecified: Secondary | ICD-10-CM

## 2011-03-16 ENCOUNTER — Encounter: Payer: Medicaid Other | Admitting: Rehabilitative and Restorative Service Providers"

## 2011-03-16 LAB — PROTIME-INR
INR: 0.9
Prothrombin Time: 12.1

## 2011-03-16 LAB — CBC
HCT: 38.5
Hemoglobin: 12.4
MCHC: 32.2
MCV: 82.9
Platelets: 304
RBC: 4.64
RDW: 16.4 — ABNORMAL HIGH
WBC: 6.1

## 2011-03-16 LAB — BASIC METABOLIC PANEL
BUN: 7
CO2: 27
Calcium: 9
Chloride: 103
Creatinine, Ser: 0.89
GFR calc Af Amer: >60
GFR calc non Af Amer: >60
Glucose, Bld: 110 — ABNORMAL HIGH
Potassium: 3.4 — ABNORMAL LOW
Sodium: 134 — ABNORMAL LOW

## 2011-03-16 LAB — GLUCOSE, CAPILLARY
Glucose-Capillary: 118 — ABNORMAL HIGH
Glucose-Capillary: 142 — ABNORMAL HIGH

## 2011-03-16 LAB — POCT I-STAT, CHEM 8
BUN: 7
Calcium, Ion: 1.25
Chloride: 103
Creatinine, Ser: 1.1
Glucose, Bld: 107 — ABNORMAL HIGH
HCT: 39
Hemoglobin: 13.3
Potassium: 3.7
Sodium: 138
TCO2: 31

## 2011-03-16 LAB — POCT CARDIAC MARKERS
CKMB, poc: <1 — ABNORMAL LOW
Myoglobin, poc: 47.7
Troponin i, poc: <0.05

## 2011-03-16 LAB — MAGNESIUM: Magnesium: 2

## 2011-03-16 LAB — CARDIAC PANEL(CRET KIN+CKTOT+MB+TROPI)
CK, MB: 1.5
Relative Index: INVALID
Total CK: 91
Troponin I: 0.02

## 2011-03-16 LAB — CK TOTAL AND CKMB (NOT AT ARMC)
CK, MB: 1.7
Relative Index: 1.4
Total CK: 121

## 2011-03-16 LAB — TSH: TSH: 4.279

## 2011-03-16 LAB — B-NATRIURETIC PEPTIDE (CONVERTED LAB): Pro B Natriuretic peptide (BNP): 47

## 2011-03-16 LAB — APTT: aPTT: 28

## 2011-03-16 LAB — TROPONIN I: Troponin I: 0.01

## 2011-03-18 ENCOUNTER — Ambulatory Visit: Payer: PRIVATE HEALTH INSURANCE | Attending: Specialist | Admitting: Rehabilitative and Restorative Service Providers"

## 2011-03-18 DIAGNOSIS — R262 Difficulty in walking, not elsewhere classified: Secondary | ICD-10-CM | POA: Insufficient documentation

## 2011-03-18 DIAGNOSIS — M25579 Pain in unspecified ankle and joints of unspecified foot: Secondary | ICD-10-CM | POA: Insufficient documentation

## 2011-03-18 DIAGNOSIS — R5381 Other malaise: Secondary | ICD-10-CM | POA: Insufficient documentation

## 2011-03-18 DIAGNOSIS — M25673 Stiffness of unspecified ankle, not elsewhere classified: Secondary | ICD-10-CM | POA: Insufficient documentation

## 2011-03-18 DIAGNOSIS — IMO0001 Reserved for inherently not codable concepts without codable children: Secondary | ICD-10-CM | POA: Insufficient documentation

## 2011-03-18 DIAGNOSIS — M25676 Stiffness of unspecified foot, not elsewhere classified: Secondary | ICD-10-CM | POA: Insufficient documentation

## 2011-03-22 ENCOUNTER — Ambulatory Visit: Payer: PRIVATE HEALTH INSURANCE

## 2011-03-25 ENCOUNTER — Ambulatory Visit: Payer: PRIVATE HEALTH INSURANCE | Admitting: Rehabilitative and Restorative Service Providers"

## 2011-03-29 ENCOUNTER — Other Ambulatory Visit: Payer: Self-pay

## 2011-03-29 ENCOUNTER — Other Ambulatory Visit (HOSPITAL_COMMUNITY)
Admission: RE | Admit: 2011-03-29 | Discharge: 2011-03-29 | Disposition: A | Discharge status: Home / Self Care | Payer: PRIVATE HEALTH INSURANCE | Source: Ambulatory Visit | Attending: Family Medicine | Admitting: Family Medicine

## 2011-03-29 ENCOUNTER — Encounter: Payer: Medicaid Other | Admitting: Rehabilitative and Restorative Service Providers"

## 2011-03-29 DIAGNOSIS — Z01419 Encounter for gynecological examination (general) (routine) without abnormal findings: Secondary | ICD-10-CM | POA: Insufficient documentation

## 2011-04-01 ENCOUNTER — Encounter: Payer: Medicaid Other | Admitting: Rehabilitative and Restorative Service Providers"

## 2011-05-02 ENCOUNTER — Encounter (HOSPITAL_COMMUNITY): Payer: Self-pay

## 2011-05-02 ENCOUNTER — Emergency Department (INDEPENDENT_AMBULATORY_CARE_PROVIDER_SITE_OTHER)
Admission: EM | Admit: 2011-05-02 | Discharge: 2011-05-02 | Disposition: A | Discharge status: Home / Self Care | Payer: Medicaid Other | Source: Home / Self Care

## 2011-05-02 DIAGNOSIS — G8929 Other chronic pain: Secondary | ICD-10-CM

## 2011-05-02 DIAGNOSIS — M25569 Pain in unspecified knee: Secondary | ICD-10-CM

## 2011-05-02 MED ORDER — HYDROCODONE-ACETAMINOPHEN 5-500 MG PO TABS: 1.0000 | ORAL_TABLET | Freq: Four times a day (QID) | ORAL | Status: AC | PRN

## 2011-05-02 NOTE — ED Provider Notes (Signed)
History     CSN: [id number] Arrival date & time: 05/02/2011  9:14 AM   First MD Initiated Contact with Patient 05/02/11 [phone]      Chief Complaint  Patient presents with  . Knee Pain    rt knee pain, had knee replacement and has pain with cold weather    (Consider location/radiation/quality/duration/timing/severity/associated sxs/prior treatment) Patient is a 50 y.o. female presenting with knee pain. The history is provided by the patient.  Knee Pain This is a chronic (h/o total knee replacement stillpresents with recurrent right knee pain has been referred to pain management clinic only medication that work is narcotics as per pt report) problem. The problem occurs constantly. The problem has not changed since onset.The symptoms are aggravated by walking and standing. The symptoms are relieved by narcotics. 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 The treatment provided significant relief.    Past Medical History  Diagnosis Date  . History of knee replacement, total   . Fever   . Breast lump   . Breast discharge   . Breast pain   . Diabetes mellitus   . Thyroid disease   . Asthma   . Hiatal hernia   . Arthritis   . Depression   . Hypertension   . Grave's disease   . Bipolar 1 disorder     Past Surgical History  Procedure Date  . Cesarean section   . Breast lumpectomy     right  . Hernia repair   . Appendectomy   . Abdominal hysterectomy     partial  . Knee surgery     right    Family History  Problem Relation Age of Onset  . Cancer Father     colon  . Stroke Father   . Heart disease Father   . Depression Sister     History  Substance Use Topics  . Smoking status: Current Everyday Smoker -- 0.5 packs/day for 4 years    Types: Cigarettes  . Smokeless tobacco: Not on file  . Alcohol Use: Yes    OB History    Grav Para Term Preterm Abortions TAB SAB Ect Mult Living                  Review of Systems  Constitutional: Negative.     Musculoskeletal: Positive for arthralgias. Negative for back pain and joint swelling.    Allergies  Aspirin and Sulphur  Home Medications   Current Outpatient Rx  Name Route Sig Dispense Refill  . ARIPIPRAZOLE 5 MG PO TABS Oral Take 5 mg by mouth daily.      . WELLBUTRIN PO Oral Take 450 mg by mouth daily.      . BUSPAR PO Oral Take by mouth daily.      Marland Kitchen VALIUM PO Oral Take by mouth daily.      Marland Kitchen HYDROCODONE-ACETAMINOPHEN 5-500 MG PO TABS Oral Take 1-2 tablets by mouth every 6 (six) hours as needed for pain. 8 tablet 0  . LEVOTHYROXINE SODIUM 125 MCG PO TABS Oral Take 125 mcg by mouth daily.      Marland Kitchen LISINOPRIL PO Oral Take by mouth daily.      Marland Kitchen METFORMIN HCL ER (MOD) 1000 MG PO TB24 Oral Take 1,000 mg by mouth 2 (two) times daily with a meal.      . SIMVASTATIN PO Oral Take by mouth daily.        BP 149/94  Pulse 83  Temp(Src) 98.6 F (37  C) (Oral)  Resp 16  SpO2 99%  Physical Exam  Nursing note and vitals reviewed. Constitutional: She appears well-developed and well-nourished. No distress.       obese  Cardiovascular: Normal rate, regular rhythm and normal heart sounds.   Pulmonary/Chest: Breath sounds normal.  Musculoskeletal:       Right knee: She exhibits decreased range of motion. She exhibits no swelling, no effusion, no ecchymosis, no deformity, no erythema, normal alignment, no LCL laxity and normal patellar mobility.       Legs:   ED Course  Procedures (including critical care time)  Labs Reviewed - No data to display No results found.   1. Chronic knee pain       MDM  Pt. With chronic pain in right knee no new injury or acute findings on exam. Pt. requesting narcotics refill.  Refilled 8 pills of Vicodin to breach until weekday when pt has been instructed to contact primary care provider to discuss chronic pain management options.        Sharin Grave, MD 05/04/11 1250

## 2011-07-01 ENCOUNTER — Emergency Department (INDEPENDENT_AMBULATORY_CARE_PROVIDER_SITE_OTHER)
Admission: EM | Admit: 2011-07-01 | Discharge: 2011-07-01 | Disposition: A | Discharge status: Home / Self Care | Payer: Medicaid Other | Source: Home / Self Care | Attending: Family Medicine | Admitting: Family Medicine

## 2011-07-01 DIAGNOSIS — S86912A Strain of unspecified muscle(s) and tendon(s) at lower leg level, left leg, initial encounter: Secondary | ICD-10-CM

## 2011-07-01 DIAGNOSIS — IMO0002 Reserved for concepts with insufficient information to code with codable children: Secondary | ICD-10-CM

## 2011-07-01 MED ORDER — CYCLOBENZAPRINE HCL 10 MG PO TABS: 10.0000 mg | ORAL_TABLET | Freq: Two times a day (BID) | ORAL | Status: AC | PRN

## 2011-07-01 MED ORDER — IBUPROFEN 400 MG PO TABS: 400.0000 mg | ORAL_TABLET | Freq: Three times a day (TID) | ORAL | Status: AC | PRN

## 2011-07-03 NOTE — ED Provider Notes (Signed)
History     CSN: [id number]  Arrival date & time 07/01/11  4098   First MD Initiated Contact with Patient 07/01/11 [phone]      No chief complaint on file.   (Consider location/radiation/quality/duration/timing/severity/associated sxs/prior treatment) HPI Comments: 51 y/o smoker, obese female with h/o chronic right knee pain for what she is followed at a pain management clinic. Today she comes c/o left knee pain after overstretching in bend position her knee while getting out of the public bus. Able to bear weight denies fall or hitting any of her knees with the floor or any object. Denies swelling. States she "took her Vicodin pill with gabapentin today and will only like a pain killer shot to help her to go back to school" her blood pressure was read elevated on arrival but was improved on second take. Pt. Denies headache, chest pain, dizziness, blurry vision or any other cardiovascular symptoms. States she took her blood pressure medication before leaving her home today.    Past Medical History  Diagnosis Date  . History of knee replacement, total   . Fever   . Breast lump   . Breast discharge   . Breast pain   . Diabetes mellitus   . Thyroid disease   . Asthma   . Hiatal hernia   . Arthritis   . Depression   . Hypertension   . Grave's disease   . Bipolar 1 disorder     Past Surgical History  Procedure Date  . Cesarean section   . Breast lumpectomy     right  . Hernia repair   . Appendectomy   . Abdominal hysterectomy     partial  . Knee surgery     right    Family History  Problem Relation Age of Onset  . Cancer Father     colon  . Stroke Father   . Heart disease Father   . Depression Sister     History  Substance Use Topics  . Smoking status: Current Everyday Smoker -- 0.5 packs/day for 4 years    Types: Cigarettes  . Smokeless tobacco: Not on file  . Alcohol Use: Yes    OB History    Grav Para Term Preterm Abortions TAB SAB Ect Mult Living           Review of Systems  Respiratory: Negative for shortness of breath.   Cardiovascular: Negative for chest pain and palpitations.  Musculoskeletal:       Left knee pain as per HPI.  Neurological: Negative for dizziness, weakness, numbness and headaches.    Allergies  Aspirin and Sulphur  Home Medications   Current Outpatient Rx  Name Route Sig Dispense Refill  . ARIPIPRAZOLE 5 MG PO TABS Oral Take 5 mg by mouth daily.      . WELLBUTRIN PO Oral Take 450 mg by mouth daily.      . BUSPAR PO Oral Take by mouth daily.      . CYCLOBENZAPRINE HCL 10 MG PO TABS Oral Take 1 tablet (10 mg total) by mouth 2 (two) times daily as needed for muscle spasms. 20 tablet 0  . VALIUM PO Oral Take by mouth daily.      . IBUPROFEN 400 MG PO TABS Oral Take 1 tablet (400 mg total) by mouth every 8 (eight) hours as needed for pain. 15 tablet 0  . LEVOTHYROXINE SODIUM 125 MCG PO TABS Oral Take 125 mcg by mouth daily.      Marland Kitchen LISINOPRIL  PO Oral Take by mouth daily.      Marland Kitchen METFORMIN HCL ER (MOD) 1000 MG PO TB24 Oral Take 1,000 mg by mouth 2 (two) times daily with a meal.      . SIMVASTATIN PO Oral Take by mouth daily.        BP 178/98  Pulse 71  Temp(Src) 97.5 F (36.4 C) (Oral)  Resp 20  SpO2 100%  Physical Exam  Nursing note and vitals reviewed. Constitutional: She is oriented to person, place, and time. She appears well-developed and well-nourished. No distress.       Obese. Was sitting in wheelchair able to get up and a get her tight jeans down before examination with no obvious difficulty or discomfort.  HENT:  Head: Normocephalic and atraumatic.  Eyes: EOM are normal. Pupils are equal, round, and reactive to light.  Neck: No JVD present.  Cardiovascular: Normal rate, regular rhythm and normal heart sounds.   Pulmonary/Chest: Breath sounds normal.  Musculoskeletal:       Left knee: no erythema, hematoma, swelling or obvious deformity. Weight bearing. No effusion. No crepitus. FROM.  Limited exam due to body habitus. Reported diffused tenderness.  Neurological: She is alert and oriented to person, place, and time. She has normal reflexes. No cranial nerve deficit. She exhibits normal muscle tone. Coordination normal.  Skin: No rash noted.       No bruising, abrasions or cuts.    ED Course  Procedures (including critical care time)  Labs Reviewed - No data to display No results found.   1. Strain of left knee and leg       MDM  Provided knee sleeve. Added ibuprofen prn  to current regime. Asked to follow up with her PCP.        Sharin Grave, MD 07/03/11 2008

## 2011-09-02 ENCOUNTER — Emergency Department (INDEPENDENT_AMBULATORY_CARE_PROVIDER_SITE_OTHER)
Admission: EM | Admit: 2011-09-02 | Discharge: 2011-09-02 | Disposition: A | Discharge status: Home / Self Care | Payer: PRIVATE HEALTH INSURANCE | Source: Home / Self Care | Attending: Family Medicine | Admitting: Family Medicine

## 2011-09-02 ENCOUNTER — Encounter (HOSPITAL_COMMUNITY): Payer: Self-pay | Admitting: Emergency Medicine

## 2011-09-02 DIAGNOSIS — M25569 Pain in unspecified knee: Secondary | ICD-10-CM

## 2011-09-02 MED ORDER — KETOROLAC TROMETHAMINE 60 MG/2ML IM SOLN
INTRAMUSCULAR | Status: AC
  Filled 2011-09-02: qty 2

## 2011-09-02 MED ORDER — KETOROLAC TROMETHAMINE 60 MG/2ML IM SOLN
60.0000 mg | Freq: Once | INTRAMUSCULAR | Status: AC
  Administered 2011-09-02: 60 mg via INTRAMUSCULAR

## 2011-09-02 NOTE — ED Notes (Signed)
Pt has osteoarthritis. Both knees are hurting since 08/31/2011. She had a right knee replacement 3 years ago and currently wears a brace. Pt has diabetic peripheral neuropathy. No noted wounds on feet bilaterally.

## 2011-09-02 NOTE — Discharge Instructions (Signed)
Continue your chronic medications. Follow up with your orthopedist

## 2011-09-02 NOTE — ED Provider Notes (Signed)
History     CSN: [id number]  Arrival date & time 09/02/11  1221   First MD Initiated Contact with Patient 09/02/11 1304      Chief Complaint  Patient presents with  . Knee Pain    (Consider location/radiation/quality/duration/timing/severity/associated sxs/prior treatment) HPI Comments: The patient reports have had right knee replacement 3 yrs ago. States she has had problems since. She is in pain management. She is requesting and injection for pain that will not void her contract with pain management. She requests a non narcotic. She states she has pain meds and has an appt with her ortho nest wed. She denies new injury.   The history is provided by the patient.    Past Medical History  Diagnosis Date  . History of knee replacement, total   . Fever   . Breast lump   . Breast discharge   . Breast pain   . Diabetes mellitus   . Thyroid disease   . Asthma   . Hiatal hernia   . Arthritis   . Depression   . Hypertension   . Grave's disease   . Bipolar 1 disorder     Past Surgical History  Procedure Date  . Cesarean section   . Breast lumpectomy     right  . Hernia repair   . Appendectomy   . Abdominal hysterectomy     partial  . Knee surgery     right  . Myomectomy abdominal approach 1989  . Myomectomy abdominal approach 1989    Family History  Problem Relation Age of Onset  . Cancer Father     colon  . Stroke Father   . Heart disease Father   . Depression Sister     History  Substance Use Topics  . Smoking status: Former Smoker -- 0.5 packs/day for 4 years    Types: Cigarettes    Quit date: 11/13/2010  . Smokeless tobacco: Never Used  . Alcohol Use: No    OB History    Grav Para Term Preterm Abortions TAB SAB Ect Mult Living                  Review of Systems  Constitutional: Negative.   HENT: Negative.   Respiratory: Negative.   Cardiovascular: Negative.   Gastrointestinal: Negative.   Genitourinary: Negative.     Allergies  Aspirin  and Sulphur  Home Medications   Current Outpatient Rx  Name Route Sig Dispense Refill  . ARIPIPRAZOLE 5 MG PO TABS Oral Take 5 mg by mouth daily.      . WELLBUTRIN PO Oral Take 450 mg by mouth daily.      . BUSPAR PO Oral Take by mouth daily.      Marland Kitchen VALIUM PO Oral Take by mouth daily.      Marland Kitchen GABAPENTIN 300 MG PO CAPS Oral Take 300 mg by mouth 3 (three) times daily - between meals and at bedtime.    Marland Kitchen HYDROCODONE-ACETAMINOPHEN 5-325 MG PO TABS Oral Take 1 tablet by mouth every 8 (eight) hours as needed.    Marland Kitchen LEVOTHYROXINE SODIUM 125 MCG PO TABS Oral Take 125 mcg by mouth daily.      Marland Kitchen LISINOPRIL PO Oral Take by mouth daily.      Marland Kitchen METFORMIN HCL ER (MOD) 1000 MG PO TB24 Oral Take 1,000 mg by mouth 2 (two) times daily with a meal.      . SIMVASTATIN PO Oral Take by mouth daily.      Marland Kitchen  TRAMADOL HCL 50 MG PO TABS Oral Take by mouth every 6 (six) hours as needed.      BP 130/93  Pulse 83  Temp(Src) 97.6 F (36.4 C) (Oral)  Resp 18  SpO2 97%  Physical Exam  Nursing note and vitals reviewed. Constitutional: She appears well-developed and well-nourished. No distress.  Cardiovascular: Normal rate and regular rhythm.   Pulmonary/Chest: Effort normal and breath sounds normal.  Musculoskeletal:       Evaluation of the right knee reveals no swelling or deformity. She is wearing a brace. No increased warmth. She is able to bear weight.   Skin: Skin is warm and dry.    ED Course  Procedures (including critical care time)  Labs Reviewed - No data to display No results found.   1. Knee pain       MDM          Randa Spike, MD 09/02/11 1515

## 2011-09-06 ENCOUNTER — Emergency Department (HOSPITAL_COMMUNITY): Payer: PRIVATE HEALTH INSURANCE

## 2011-09-06 ENCOUNTER — Emergency Department (HOSPITAL_COMMUNITY)
Admission: EM | Admit: 2011-09-06 | Discharge: 2011-09-06 | Disposition: A | Discharge status: Home / Self Care | Payer: PRIVATE HEALTH INSURANCE | Attending: Emergency Medicine | Admitting: Emergency Medicine

## 2011-09-06 ENCOUNTER — Encounter (HOSPITAL_COMMUNITY): Payer: Self-pay | Admitting: Emergency Medicine

## 2011-09-06 DIAGNOSIS — E079 Disorder of thyroid, unspecified: Secondary | ICD-10-CM | POA: Insufficient documentation

## 2011-09-06 DIAGNOSIS — R0602 Shortness of breath: Secondary | ICD-10-CM

## 2011-09-06 DIAGNOSIS — J069 Acute upper respiratory infection, unspecified: Secondary | ICD-10-CM

## 2011-09-06 DIAGNOSIS — I10 Essential (primary) hypertension: Secondary | ICD-10-CM | POA: Insufficient documentation

## 2011-09-06 DIAGNOSIS — E119 Type 2 diabetes mellitus without complications: Secondary | ICD-10-CM | POA: Insufficient documentation

## 2011-09-06 DIAGNOSIS — J45909 Unspecified asthma, uncomplicated: Secondary | ICD-10-CM | POA: Insufficient documentation

## 2011-09-06 DIAGNOSIS — F319 Bipolar disorder, unspecified: Secondary | ICD-10-CM | POA: Insufficient documentation

## 2011-09-06 DIAGNOSIS — Z8739 Personal history of other diseases of the musculoskeletal system and connective tissue: Secondary | ICD-10-CM | POA: Insufficient documentation

## 2011-09-06 DIAGNOSIS — Z79899 Other long term (current) drug therapy: Secondary | ICD-10-CM | POA: Insufficient documentation

## 2011-09-06 DIAGNOSIS — R079 Chest pain, unspecified: Secondary | ICD-10-CM | POA: Insufficient documentation

## 2011-09-06 LAB — COMPREHENSIVE METABOLIC PANEL
ALT: 14 U/L (ref 0–35)
AST: 17 U/L (ref 0–37)
Albumin: 3.7 g/dL (ref 3.5–5.2)
Alkaline Phosphatase: 91 U/L (ref 39–117)
BUN: 8 mg/dL (ref 6–23)
CO2: 25 mEq/L (ref 19–32)
Calcium: 9.1 mg/dL (ref 8.4–10.5)
Chloride: 104 mEq/L (ref 96–112)
Creatinine, Ser: 0.8 mg/dL (ref 0.50–1.10)
GFR calc Af Amer: >90 mL/min (ref >90)
GFR calc non Af Amer: 84 mL/min — ABNORMAL LOW (ref >90)
Glucose, Bld: 105 mg/dL — ABNORMAL HIGH (ref 70–99)
Potassium: 3.4 mEq/L — ABNORMAL LOW (ref 3.5–5.1)
Sodium: 138 mEq/L (ref 135–145)
Total Bilirubin: 0.3 mg/dL (ref 0.3–1.2)
Total Protein: 7.4 g/dL (ref 6.0–8.3)

## 2011-09-06 LAB — CBC
HCT: 40.3 % (ref 36.0–46.0)
Hemoglobin: 13.2 g/dL (ref 12.0–15.0)
MCH: 26 pg (ref 26.0–34.0)
MCHC: 32.8 g/dL (ref 30.0–36.0)
MCV: 79.5 fL (ref 78.0–100.0)
Platelets: 240 10*3/uL (ref 150–400)
RBC: 5.07 MIL/uL (ref 3.87–5.11)
RDW: 17.5 % — ABNORMAL HIGH (ref 11.5–15.5)
WBC: 5.1 10*3/uL (ref 4.0–10.5)

## 2011-09-06 LAB — TROPONIN I
Troponin I: <0.3 ng/mL (ref <0.30)
Troponin I: <0.3 ng/mL (ref <0.30)

## 2011-09-06 MED ORDER — ALBUTEROL SULFATE (5 MG/ML) 0.5% IN NEBU
5.0000 mg | INHALATION_SOLUTION | Freq: Once | RESPIRATORY_TRACT | Status: AC
  Administered 2011-09-06: 5 mg via RESPIRATORY_TRACT
  Filled 2011-09-06: qty 1

## 2011-09-06 MED ORDER — ALBUTEROL SULFATE HFA 108 (90 BASE) MCG/ACT IN AERS
2.0000 | INHALATION_SPRAY | RESPIRATORY_TRACT | Status: DC | PRN
Start: 2011-09-06 — End: 2011-09-06
  Filled 2011-09-06: qty 6.7

## 2011-09-06 MED ORDER — POTASSIUM CHLORIDE CRYS ER 20 MEQ PO TBCR
20.0000 meq | EXTENDED_RELEASE_TABLET | Freq: Once | ORAL | Status: AC
  Administered 2011-09-06: 20 meq via ORAL
  Filled 2011-09-06: qty 1

## 2011-09-06 MED ORDER — AZITHROMYCIN 250 MG PO TABS: 250.0000 mg | ORAL_TABLET | Freq: Every day | ORAL | Status: AC

## 2011-09-06 MED ORDER — PREDNISONE 20 MG PO TABS
60.0000 mg | ORAL_TABLET | Freq: Once | ORAL | Status: AC
  Administered 2011-09-06: 60 mg via ORAL
  Filled 2011-09-06: qty 3

## 2011-09-06 MED ORDER — IPRATROPIUM BROMIDE 0.02 % IN SOLN
RESPIRATORY_TRACT | Status: AC
  Filled 2011-09-06: qty 2.5

## 2011-09-06 MED ORDER — IPRATROPIUM BROMIDE 0.02 % IN SOLN
0.5000 mg | Freq: Once | RESPIRATORY_TRACT | Status: AC
Start: 2011-09-06 — End: 2011-09-06
  Administered 2011-09-06: 0.5 mg via RESPIRATORY_TRACT
  Filled 2011-09-06: qty 2.5

## 2011-09-06 MED ORDER — ALBUTEROL SULFATE (5 MG/ML) 0.5% IN NEBU
INHALATION_SOLUTION | RESPIRATORY_TRACT | Status: AC
  Filled 2011-09-06: qty 2

## 2011-09-06 MED ORDER — ONDANSETRON HCL 4 MG/2ML IJ SOLN
INTRAMUSCULAR | Status: AC
  Filled 2011-09-06: qty 2

## 2011-09-06 NOTE — ED Provider Notes (Signed)
Report received from Dr. Denton Lank.  Patient in CDU awaiting one more troponin at 12:45 today for chest pain.  Scattered wheezing upper lobes.  Albuterol neb ordered.  Diet ordered.   1400 Patient feels better.  Troponin negative x 2. Will get rx for z-pack and inhaler.  Discharge instructions discussed.  Patient is ready for discharge.   Jethro Bastos, NP 09/07/11 1133

## 2011-09-06 NOTE — Discharge Instructions (Signed)
Caroline Williams you have an upper respiratory infection today. The EKG and heart labs today were normal.  Start the antibiotics today.  Use the inhaler no more than 4 times a day.  Follow up with your PCP this week.  Return for severe SOB or chest pain.

## 2011-09-06 NOTE — ED Provider Notes (Signed)
History     CSN: [id number]  Arrival date & time 09/06/11  2130   First MD Initiated Contact with Patient 09/06/11 [phone]      Chief Complaint  Patient presents with  . Chest Pain  . Shortness of Breath    (Consider location/radiation/quality/duration/timing/severity/associated sxs/prior treatment) Patient is a 51 y.o. female presenting with chest pain and shortness of breath. The history is provided by the patient.  Chest Pain Primary symptoms include shortness of breath and wheezing. Pertinent negatives for primary symptoms include no fever and no abdominal pain.    Shortness of Breath  Associated symptoms include chest pain, shortness of breath and wheezing. Pertinent negatives include no fever.  pt states in past couple days had developed non productive cough, wheezing. Denies sore throat, or other uri c/o. No fever or chills. Hx asthma. Uses mdi at home prn. Denies any recent admission or steroid rx. States w coughing spells, midline, mid to lower sternal cp. Sharp. Worse w cough and palpation. No leg pain or swelling. No dvt or pe hx. No hx cad, had normal cath a few years ago. Denies fam hx premature cad. No other recent exertional cp or discomfort. No unusual doe/fatigue.   Past Medical History  Diagnosis Date  . History of knee replacement, total   . Fever   . Breast lump   . Breast discharge   . Breast pain   . Diabetes mellitus   . Thyroid disease   . Asthma   . Hiatal hernia   . Arthritis   . Depression   . Hypertension   . Grave's disease   . Bipolar 1 disorder     Past Surgical History  Procedure Date  . Cesarean section   . Breast lumpectomy     right  . Hernia repair   . Appendectomy   . Abdominal hysterectomy     partial  . Knee surgery     right  . Myomectomy abdominal approach 1989  . Myomectomy abdominal approach 1989    Family History  Problem Relation Age of Onset  . Cancer Father     colon  . Stroke Father   . Heart disease Father     . Depression Sister     History  Substance Use Topics  . Smoking status: Former Smoker -- 0.5 packs/day for 4 years    Types: Cigarettes    Quit date: 11/13/2010  . Smokeless tobacco: Never Used  . Alcohol Use: No    OB History    Grav Para Term Preterm Abortions TAB SAB Ect Mult Living                  Review of Systems  Constitutional: Negative for fever and chills.  HENT: Negative for neck pain and neck stiffness.   Eyes: Negative for redness.  Respiratory: Positive for shortness of breath and wheezing.   Cardiovascular: Positive for chest pain. Negative for leg swelling.  Gastrointestinal: Negative for abdominal pain.  Genitourinary: Negative for flank pain.  Musculoskeletal: Negative for back pain.  Skin: Negative for rash.  Neurological: Negative for headaches.  Hematological: Does not bruise/bleed easily.  Psychiatric/Behavioral: Negative for confusion.    Allergies  Aspirin and Sulphur  Home Medications   Current Outpatient Rx  Name Route Sig Dispense Refill  . ARIPIPRAZOLE 5 MG PO TABS Oral Take 5 mg by mouth daily.      . WELLBUTRIN PO Oral Take 450 mg by mouth daily.      Marland Kitchen  BUSPAR PO Oral Take by mouth daily.      Marland Kitchen VALIUM PO Oral Take by mouth daily.      Marland Kitchen GABAPENTIN 300 MG PO CAPS Oral Take 300 mg by mouth 3 (three) times daily - between meals and at bedtime.    Marland Kitchen HYDROCODONE-ACETAMINOPHEN 5-325 MG PO TABS Oral Take 1 tablet by mouth every 8 (eight) hours as needed.    Marland Kitchen LEVOTHYROXINE SODIUM 125 MCG PO TABS Oral Take 125 mcg by mouth daily.      Marland Kitchen LISINOPRIL PO Oral Take by mouth daily.      Marland Kitchen METFORMIN HCL ER (MOD) 1000 MG PO TB24 Oral Take 1,000 mg by mouth 2 (two) times daily with a meal.      . SIMVASTATIN PO Oral Take by mouth daily.      . TRAMADOL HCL 50 MG PO TABS Oral Take by mouth every 6 (six) hours as needed.      BP 147/78  Pulse 98  Temp 99.2 F (37.3 C)  Resp 22  Ht 5\' 5"  (1.651 m)  Wt 300 lb (136.079 kg)  BMI 49.92 kg/m2   SpO2 100%  Physical Exam  Nursing note and vitals reviewed. Constitutional: She is oriented to person, place, and time. She appears well-developed and well-nourished. No distress.  Eyes: Conjunctivae are normal. No scleral icterus.  Neck: Neck supple. No tracheal deviation present.  Cardiovascular: Normal rate, regular rhythm, normal heart sounds and intact distal pulses.  Exam reveals no gallop and no friction rub.   No murmur heard. Pulmonary/Chest: Effort normal. No respiratory distress. She has wheezes. She exhibits tenderness.  Abdominal: Soft. Normal appearance and bowel sounds are normal. She exhibits no distension. There is no tenderness.  Musculoskeletal: She exhibits no edema and no tenderness.  Neurological: She is alert and oriented to person, place, and time.  Skin: Skin is warm and dry. No rash noted.  Psychiatric: She has a normal mood and affect.    ED Course  Procedures (including critical care time)   Labs Reviewed  TROPONIN I  CBC  COMPREHENSIVE METABOLIC PANEL   Results for orders placed during the hospital encounter of 09/06/11  TROPONIN I      Component Value Range   Troponin I <0.30  <0.30 (ng/mL)  CBC      Component Value Range   WBC 5.1  4.0 - 10.5 (K/uL)   RBC 5.07  3.87 - 5.11 (MIL/uL)   Hemoglobin 13.2  12.0 - 15.0 (g/dL)   HCT 16.1  09.6 - 04.5 (%)   MCV 79.5  78.0 - 100.0 (fL)   MCH 26.0  26.0 - 34.0 (pg)   MCHC 32.8  30.0 - 36.0 (g/dL)   RDW 40.9 (*) 81.1 - 15.5 (%)   Platelets 240  150 - 400 (K/uL)  COMPREHENSIVE METABOLIC PANEL      Component Value Range   Sodium 138  135 - 145 (mEq/L)   Potassium 3.4 (*) 3.5 - 5.1 (mEq/L)   Chloride 104  96 - 112 (mEq/L)   CO2 25  19 - 32 (mEq/L)   Glucose, Bld 105 (*) 70 - 99 (mg/dL)   BUN 8  6 - 23 (mg/dL)   Creatinine, Ser 9.14  0.50 - 1.10 (mg/dL)   Calcium 9.1  8.4 - 78.2 (mg/dL)   Total Protein 7.4  6.0 - 8.3 (g/dL)   Albumin 3.7  3.5 - 5.2 (g/dL)   AST 17  0 - 37 (U/L)   ALT  14  0 - 35  (U/L)   Alkaline Phosphatase 91  39 - 117 (U/L)   Total Bilirubin 0.3  0.3 - 1.2 (mg/dL)   GFR calc non Af Amer 84 (*) >90 (mL/min)   GFR calc Af Amer >90  >90 (mL/min)   Dg Chest 2 View  09/06/2011  *RADIOLOGY REPORT*  Clinical Data: Chest pain.  CHEST - 2 VIEW  Comparison: 08/18/2009.  Findings: The cardiac silhouette, mediastinal and hilar contours are within normal limits and stable.  The lungs are clear.  No pleural effusion.  The bony thorax is intact.  IMPRESSION: No acute cardiopulmonary findings.  No change since prior study.  Original Report Authenticated By: P. Loralie Champagne, M.D.      MDM  Iv ns. Labs. Cxr.    Date: 09/06/2011  Rate: 94  Rhythm: normal sinus rhythm  QRS Axis: normal  Intervals: normal  ST/T Wave abnormalities: normal  Conduction Disutrbances:none  Narrative Interpretation:   Old EKG Reviewed: unchanged   Recheck mild wheezing bil. pred po. Albuterol and atrovent neb.  Given cp, will get 3 hr trop, if neg, and no wheezing/dyspnea anticipate stable for d/c.      Suzi Roots, MD 09/09/11 1721

## 2011-09-06 NOTE — ED Notes (Signed)
Per EMS- pt coming from school where she states she was having trouble breathing and chest pain.  Pt states she began feeling sick last night with flu like symptoms.  Ems gave two breathing treatments and 2 nitro sl, and 4mg  of zofran.   Pt states nitro did help the pain went from an 8/10 to 5/10.

## 2011-09-09 NOTE — ED Provider Notes (Signed)
Medical screening examination/treatment/procedure(s) were conducted as a shared visit with non-physician practitioner(s) and myself.  I personally evaluated the patient during the encounter   Suzi Roots, MD 09/09/11 8119

## 2011-10-07 ENCOUNTER — Ambulatory Visit: Payer: PRIVATE HEALTH INSURANCE | Attending: Orthopedic Surgery | Admitting: Physical Therapy

## 2011-10-07 DIAGNOSIS — M25569 Pain in unspecified knee: Secondary | ICD-10-CM | POA: Insufficient documentation

## 2011-10-07 DIAGNOSIS — R262 Difficulty in walking, not elsewhere classified: Secondary | ICD-10-CM | POA: Insufficient documentation

## 2011-10-07 DIAGNOSIS — IMO0001 Reserved for inherently not codable concepts without codable children: Secondary | ICD-10-CM | POA: Insufficient documentation

## 2011-10-07 DIAGNOSIS — M6281 Muscle weakness (generalized): Secondary | ICD-10-CM | POA: Insufficient documentation

## 2011-10-13 ENCOUNTER — Ambulatory Visit: Payer: PRIVATE HEALTH INSURANCE | Attending: Orthopedic Surgery | Admitting: Physical Therapy

## 2011-10-13 DIAGNOSIS — R5381 Other malaise: Secondary | ICD-10-CM | POA: Insufficient documentation

## 2011-10-13 DIAGNOSIS — M25579 Pain in unspecified ankle and joints of unspecified foot: Secondary | ICD-10-CM | POA: Insufficient documentation

## 2011-10-13 DIAGNOSIS — M25673 Stiffness of unspecified ankle, not elsewhere classified: Secondary | ICD-10-CM | POA: Insufficient documentation

## 2011-10-13 DIAGNOSIS — IMO0001 Reserved for inherently not codable concepts without codable children: Secondary | ICD-10-CM | POA: Insufficient documentation

## 2011-10-13 DIAGNOSIS — R262 Difficulty in walking, not elsewhere classified: Secondary | ICD-10-CM | POA: Insufficient documentation

## 2011-10-13 DIAGNOSIS — M25676 Stiffness of unspecified foot, not elsewhere classified: Secondary | ICD-10-CM | POA: Insufficient documentation

## 2011-10-26 ENCOUNTER — Encounter: Payer: PRIVATE HEALTH INSURANCE | Admitting: Physical Therapy

## 2011-10-27 ENCOUNTER — Ambulatory Visit: Payer: PRIVATE HEALTH INSURANCE | Admitting: Physical Therapy

## 2011-10-28 ENCOUNTER — Ambulatory Visit: Payer: PRIVATE HEALTH INSURANCE | Admitting: Physical Therapy

## 2011-11-02 ENCOUNTER — Ambulatory Visit: Payer: PRIVATE HEALTH INSURANCE | Admitting: Physical Therapy

## 2011-11-02 ENCOUNTER — Encounter: Payer: PRIVATE HEALTH INSURANCE | Admitting: Physical Therapy

## 2011-11-03 ENCOUNTER — Other Ambulatory Visit: Payer: Self-pay | Admitting: Family Medicine

## 2011-11-03 DIAGNOSIS — Z1231 Encounter for screening mammogram for malignant neoplasm of breast: Secondary | ICD-10-CM

## 2011-11-04 ENCOUNTER — Encounter: Payer: PRIVATE HEALTH INSURANCE | Admitting: Physical Therapy

## 2011-11-04 ENCOUNTER — Ambulatory Visit: Payer: PRIVATE HEALTH INSURANCE | Admitting: Physical Therapy

## 2011-11-12 ENCOUNTER — Other Ambulatory Visit (HOSPITAL_COMMUNITY): Payer: Self-pay | Admitting: Orthopedic Surgery

## 2011-11-12 DIAGNOSIS — M25561 Pain in right knee: Secondary | ICD-10-CM

## 2011-11-16 ENCOUNTER — Encounter (HOSPITAL_COMMUNITY): Payer: PRIVATE HEALTH INSURANCE

## 2011-11-16 ENCOUNTER — Other Ambulatory Visit (HOSPITAL_COMMUNITY): Payer: PRIVATE HEALTH INSURANCE

## 2011-11-17 ENCOUNTER — Encounter: Payer: PRIVATE HEALTH INSURANCE | Admitting: Physical Therapy

## 2011-11-18 ENCOUNTER — Encounter: Payer: PRIVATE HEALTH INSURANCE | Admitting: Physical Therapy

## 2011-11-23 ENCOUNTER — Other Ambulatory Visit: Payer: Self-pay | Admitting: Family Medicine

## 2011-11-23 DIAGNOSIS — N61 Mastitis without abscess: Secondary | ICD-10-CM

## 2011-11-23 DIAGNOSIS — N644 Mastodynia: Secondary | ICD-10-CM

## 2011-11-23 DIAGNOSIS — N6459 Other signs and symptoms in breast: Secondary | ICD-10-CM

## 2011-11-29 ENCOUNTER — Other Ambulatory Visit: Payer: Self-pay | Admitting: Family Medicine

## 2011-11-29 ENCOUNTER — Ambulatory Visit
Admission: RE | Admit: 2011-11-29 | Discharge: 2011-11-29 | Disposition: A | Discharge status: Home / Self Care | Payer: PRIVATE HEALTH INSURANCE | Source: Ambulatory Visit | Attending: Family Medicine | Admitting: Family Medicine

## 2011-11-29 DIAGNOSIS — N644 Mastodynia: Secondary | ICD-10-CM

## 2011-11-29 DIAGNOSIS — N61 Mastitis without abscess: Secondary | ICD-10-CM

## 2011-12-10 ENCOUNTER — Other Ambulatory Visit (HOSPITAL_COMMUNITY): Payer: PRIVATE HEALTH INSURANCE

## 2011-12-10 ENCOUNTER — Encounter (HOSPITAL_COMMUNITY): Payer: PRIVATE HEALTH INSURANCE

## 2011-12-24 ENCOUNTER — Other Ambulatory Visit (HOSPITAL_COMMUNITY): Payer: PRIVATE HEALTH INSURANCE

## 2011-12-24 ENCOUNTER — Encounter (HOSPITAL_COMMUNITY): Payer: PRIVATE HEALTH INSURANCE

## 2011-12-24 ENCOUNTER — Other Ambulatory Visit (HOSPITAL_COMMUNITY): Payer: Self-pay | Admitting: Orthopedic Surgery

## 2011-12-24 ENCOUNTER — Ambulatory Visit (HOSPITAL_COMMUNITY)
Admission: RE | Admit: 2011-12-24 | Discharge: 2011-12-24 | Disposition: A | Discharge status: Home / Self Care | Payer: PRIVATE HEALTH INSURANCE | Source: Ambulatory Visit | Attending: Orthopedic Surgery | Admitting: Orthopedic Surgery

## 2011-12-24 DIAGNOSIS — M25561 Pain in right knee: Secondary | ICD-10-CM

## 2011-12-24 DIAGNOSIS — Z96659 Presence of unspecified artificial knee joint: Secondary | ICD-10-CM | POA: Insufficient documentation

## 2011-12-24 DIAGNOSIS — M25569 Pain in unspecified knee: Secondary | ICD-10-CM | POA: Insufficient documentation

## 2011-12-24 MED ORDER — TECHNETIUM TC 99M MEDRONATE IV KIT
24.8000 | PACK | Freq: Once | INTRAVENOUS | Status: AC | PRN
  Administered 2011-12-24: 24.8 via INTRAVENOUS

## 2012-02-15 ENCOUNTER — Emergency Department (HOSPITAL_COMMUNITY): Payer: PRIVATE HEALTH INSURANCE

## 2012-02-15 ENCOUNTER — Encounter (HOSPITAL_COMMUNITY): Payer: Self-pay | Admitting: Emergency Medicine

## 2012-02-15 ENCOUNTER — Emergency Department (HOSPITAL_COMMUNITY)
Admission: EM | Admit: 2012-02-15 | Discharge: 2012-02-15 | Disposition: A | Discharge status: Home Health | Payer: PRIVATE HEALTH INSURANCE | Attending: Emergency Medicine | Admitting: Emergency Medicine

## 2012-02-15 DIAGNOSIS — E079 Disorder of thyroid, unspecified: Secondary | ICD-10-CM | POA: Insufficient documentation

## 2012-02-15 DIAGNOSIS — I10 Essential (primary) hypertension: Secondary | ICD-10-CM | POA: Insufficient documentation

## 2012-02-15 DIAGNOSIS — J189 Pneumonia, unspecified organism: Secondary | ICD-10-CM | POA: Insufficient documentation

## 2012-02-15 DIAGNOSIS — F319 Bipolar disorder, unspecified: Secondary | ICD-10-CM | POA: Insufficient documentation

## 2012-02-15 DIAGNOSIS — E119 Type 2 diabetes mellitus without complications: Secondary | ICD-10-CM | POA: Insufficient documentation

## 2012-02-15 DIAGNOSIS — M129 Arthropathy, unspecified: Secondary | ICD-10-CM | POA: Insufficient documentation

## 2012-02-15 DIAGNOSIS — Z87891 Personal history of nicotine dependence: Secondary | ICD-10-CM | POA: Insufficient documentation

## 2012-02-15 DIAGNOSIS — J45909 Unspecified asthma, uncomplicated: Secondary | ICD-10-CM | POA: Insufficient documentation

## 2012-02-15 DIAGNOSIS — R319 Hematuria, unspecified: Secondary | ICD-10-CM

## 2012-02-15 LAB — CBC WITH DIFFERENTIAL/PLATELET
Basophils Absolute: 0 10*3/uL (ref 0.0–0.1)
Basophils Relative: 1 % (ref 0–1)
Eosinophils Absolute: 0.1 10*3/uL (ref 0.0–0.7)
Eosinophils Relative: 2 % (ref 0–5)
HCT: 42.5 % (ref 36.0–46.0)
Hemoglobin: 14.1 g/dL (ref 12.0–15.0)
Lymphocytes Relative: 25 % (ref 12–46)
Lymphs Abs: 1.5 10*3/uL (ref 0.7–4.0)
MCH: 26.2 pg (ref 26.0–34.0)
MCHC: 33.2 g/dL (ref 30.0–36.0)
MCV: 78.8 fL (ref 78.0–100.0)
Monocytes Absolute: 0.2 10*3/uL (ref 0.1–1.0)
Monocytes Relative: 4 % (ref 3–12)
Neutro Abs: 4.2 10*3/uL (ref 1.7–7.7)
Neutrophils Relative %: 69 % (ref 43–77)
Platelets: 278 10*3/uL (ref 150–400)
RBC: 5.39 MIL/uL — ABNORMAL HIGH (ref 3.87–5.11)
RDW: 17 % — ABNORMAL HIGH (ref 11.5–15.5)
WBC: 6.1 10*3/uL (ref 4.0–10.5)

## 2012-02-15 LAB — COMPREHENSIVE METABOLIC PANEL
ALT: 5 U/L (ref 0–35)
AST: 11 U/L (ref 0–37)
Albumin: 4.1 g/dL (ref 3.5–5.2)
Alkaline Phosphatase: 98 U/L (ref 39–117)
BUN: 10 mg/dL (ref 6–23)
CO2: 26 mEq/L (ref 19–32)
Calcium: 9.9 mg/dL (ref 8.4–10.5)
Chloride: 106 mEq/L (ref 96–112)
Creatinine, Ser: 0.99 mg/dL (ref 0.50–1.10)
GFR calc Af Amer: 75 mL/min — ABNORMAL LOW (ref >90)
GFR calc non Af Amer: 65 mL/min — ABNORMAL LOW (ref >90)
Glucose, Bld: 101 mg/dL — ABNORMAL HIGH (ref 70–99)
Potassium: 3.7 mEq/L (ref 3.5–5.1)
Sodium: 142 mEq/L (ref 135–145)
Total Bilirubin: 0.3 mg/dL (ref 0.3–1.2)
Total Protein: 8 g/dL (ref 6.0–8.3)

## 2012-02-15 LAB — URINALYSIS, ROUTINE W REFLEX MICROSCOPIC
Bilirubin Urine: NEGATIVE
Glucose, UA: NEGATIVE mg/dL
Ketones, ur: 15 mg/dL — AB
Leukocytes, UA: NEGATIVE
Nitrite: NEGATIVE
Protein, ur: NEGATIVE mg/dL
Specific Gravity, Urine: 1.024 (ref 1.005–1.030)
Urobilinogen, UA: 1 mg/dL (ref 0.0–1.0)
pH: 5.5 (ref 5.0–8.0)

## 2012-02-15 LAB — URINE MICROSCOPIC-ADD ON

## 2012-02-15 LAB — GLUCOSE, CAPILLARY
Glucose-Capillary: 103 mg/dL — ABNORMAL HIGH (ref 70–99)
Glucose-Capillary: 94 mg/dL (ref 70–99)

## 2012-02-15 MED ORDER — DEXTROSE 5 % IV SOLN
500.0000 mg | Freq: Once | INTRAVENOUS | Status: AC
  Administered 2012-02-15: 500 mg via INTRAVENOUS
  Filled 2012-02-15: qty 500

## 2012-02-15 MED ORDER — ALBUTEROL SULFATE (5 MG/ML) 0.5% IN NEBU
2.5000 mg | INHALATION_SOLUTION | Freq: Once | RESPIRATORY_TRACT | Status: AC
  Administered 2012-02-15: 2.5 mg via RESPIRATORY_TRACT
  Filled 2012-02-15: qty 0.5

## 2012-02-15 MED ORDER — AZITHROMYCIN 250 MG PO TABS: 250.0000 mg | ORAL_TABLET | Freq: Every day | ORAL | Status: AC

## 2012-02-15 MED ORDER — SODIUM CHLORIDE 0.9 % IV SOLN
Freq: Once | INTRAVENOUS | Status: AC
  Administered 2012-02-15: 08:00:00 via INTRAVENOUS

## 2012-02-15 MED ORDER — IPRATROPIUM BROMIDE 0.02 % IN SOLN
0.5000 mg | Freq: Once | RESPIRATORY_TRACT | Status: AC
  Administered 2012-02-15: 0.5 mg via RESPIRATORY_TRACT
  Filled 2012-02-15: qty 2.5

## 2012-02-15 MED ORDER — FLUCONAZOLE 150 MG PO TABS: 150.0000 mg | ORAL_TABLET | Freq: Once | ORAL | Status: AC

## 2012-02-15 NOTE — ED Notes (Signed)
No blood cx needed per MD prior to abx administration

## 2012-02-15 NOTE — ED Provider Notes (Signed)
History     CSN: [id number]  Arrival date & time 02/15/12  4098   First MD Initiated Contact with Patient 02/15/12 0703      No chief complaint on file.   (Consider location/radiation/quality/duration/timing/severity/associated sxs/prior treatment) HPI Comments: Caroline Williams 51 y.o. female   The chief complaint is: No chief complaint on file.   The patient has medical history significant for:   Past Medical History:   History of knee replacement, total                           Fever                                                        Breast lump                                                  Breast discharge                                             Breast pain                                                  Diabetes mellitus                                            Thyroid disease                                              Asthma                                                       Hiatal hernia                                                Arthritis                                                    Depression  Hypertension                                                 Grave's disease                                              Bipolar 1 disorder                                          Patient presents with a 2 week history of fatigue. She also states that for the past two days her blood glucose has been in the 400-500's. She takes Metformin only and normally has well controlled sugars. Patient also reports a dry cough for a month and a problem with black mold in her house. Denies fever, or diaphoresis, but reports chills. Denies NVD or abdominal pain. Denies CP, SOB or wheezing.      The history is provided by the patient.    Past Medical History  Diagnosis Date  . History of knee replacement, total   . Fever   . Breast lump   . Breast discharge   . Breast pain   . Diabetes mellitus   .  Thyroid disease   . Asthma   . Hiatal hernia   . Arthritis   . Depression   . Hypertension   . Grave's disease   . Bipolar 1 disorder     Past Surgical History  Procedure Date  . Cesarean section   . Breast lumpectomy     right  . Hernia repair   . Appendectomy   . Abdominal hysterectomy     partial  . Knee surgery     right  . Myomectomy abdominal approach 1989  . Myomectomy abdominal approach 1989    Family History  Problem Relation Age of Onset  . Cancer Father     colon  . Stroke Father   . Heart disease Father   . Depression Sister     History  Substance Use Topics  . Smoking status: Former Smoker -- 0.5 packs/day for 4 years    Types: Cigarettes    Quit date: 11/13/2010  . Smokeless tobacco: Never Used  . Alcohol Use: No    OB History    Grav Para Term Preterm Abortions TAB SAB Ect Mult Living                  Review of Systems  Constitutional: Positive for chills. Negative for fever and diaphoresis.  Respiratory: Positive for cough. Negative for shortness of breath and wheezing.   Cardiovascular: Negative for chest pain.  Gastrointestinal: Negative for nausea, vomiting, abdominal pain and diarrhea.    Allergies  Aspirin and Sulphur  Home Medications   Current Outpatient Rx  Name Route Sig Dispense Refill  . ARIPIPRAZOLE 5 MG PO TABS Oral Take 5 mg by mouth daily.      . BUPROPION HCL ER (XL) 300 MG PO TB24 Oral Take 450 mg by mouth daily.     . BUSPIRONE HCL 10 MG PO TABS Oral Take 5-10 mg by mouth 4 (four) times daily.    Marland Kitchen  DIAZEPAM 10 MG PO TABS Oral Take 10 mg by mouth 3 (three) times daily.    Marland Kitchen GABAPENTIN 300 MG PO CAPS Oral Take 300 mg by mouth 3 (three) times daily.     Marland Kitchen HYDROCODONE-ACETAMINOPHEN 5-325 MG PO TABS Oral Take 1 tablet by mouth every 8 (eight) hours as needed. For pain    . LEVOTHYROXINE SODIUM 125 MCG PO TABS Oral Take 125 mcg by mouth daily.      Marland Kitchen LISINOPRIL-HYDROCHLOROTHIAZIDE 20-12.5 MG PO TABS Oral Take 1 tablet  by mouth daily.    Marland Kitchen METFORMIN HCL 1000 MG PO TABS Oral Take 1,000 mg by mouth 2 (two) times daily with a meal.    . SIMVASTATIN 80 MG PO TABS Oral Take 80 mg by mouth at bedtime.    . TRAMADOL HCL 50 MG PO TABS Oral Take 50 mg by mouth every 6 (six) hours as needed. For pain      BP 153/92  Pulse 80  Temp 97.9 F (36.6 C) (Oral)  Resp 20  SpO2 93%  Physical Exam  Nursing note and vitals reviewed. Constitutional: She appears well-developed and well-nourished.  HENT:  Head: Normocephalic and atraumatic.  Mouth/Throat: Oropharynx is clear and moist.  Eyes: Conjunctivae and EOM are normal. No scleral icterus.  Cardiovascular: Normal rate, regular rhythm and normal heart sounds.   Pulmonary/Chest: Effort normal and breath sounds normal.  Abdominal: Soft. Bowel sounds are normal. There is no tenderness.  Neurological: She is alert.  Skin: Skin is dry.    ED Course  Procedures (including critical care time)  Labs Reviewed  GLUCOSE, CAPILLARY - Abnormal; Notable for the following:    Glucose-Capillary 103 (*)     All other components within normal limits  CBC WITH DIFFERENTIAL  COMPREHENSIVE METABOLIC PANEL  URINALYSIS, ROUTINE W REFLEX MICROSCOPIC   Results for orders placed during the hospital encounter of 02/15/12  GLUCOSE, CAPILLARY      Component Value Range   Glucose-Capillary 103 (*) 70 - 99 mg/dL  CBC WITH DIFFERENTIAL      Component Value Range   WBC 6.1  4.0 - 10.5 K/uL   RBC 5.39 (*) 3.87 - 5.11 MIL/uL   Hemoglobin 14.1  12.0 - 15.0 g/dL   HCT 46.9  62.9 - 52.8 %   MCV 78.8  78.0 - 100.0 fL   MCH 26.2  26.0 - 34.0 pg   MCHC 33.2  30.0 - 36.0 g/dL   RDW 41.3 (*) 24.4 - 01.0 %   Platelets 278  150 - 400 K/uL   Neutrophils Relative 69  43 - 77 %   Neutro Abs 4.2  1.7 - 7.7 K/uL   Lymphocytes Relative 25  12 - 46 %   Lymphs Abs 1.5  0.7 - 4.0 K/uL   Monocytes Relative 4  3 - 12 %   Monocytes Absolute 0.2  0.1 - 1.0 K/uL   Eosinophils Relative 2  0 - 5 %    Eosinophils Absolute 0.1  0.0 - 0.7 K/uL   Basophils Relative 1  0 - 1 %   Basophils Absolute 0.0  0.0 - 0.1 K/uL  COMPREHENSIVE METABOLIC PANEL      Component Value Range   Sodium 142  135 - 145 mEq/L   Potassium 3.7  3.5 - 5.1 mEq/L   Chloride 106  96 - 112 mEq/L   CO2 26  19 - 32 mEq/L   Glucose, Bld 101 (*) 70 - 99 mg/dL   BUN  10  6 - 23 mg/dL   Creatinine, Ser 1.61  0.50 - 1.10 mg/dL   Calcium 9.9  8.4 - 09.6 mg/dL   Total Protein 8.0  6.0 - 8.3 g/dL   Albumin 4.1  3.5 - 5.2 g/dL   AST 11  0 - 37 U/L   ALT 5  0 - 35 U/L   Alkaline Phosphatase 98  39 - 117 U/L   Total Bilirubin 0.3  0.3 - 1.2 mg/dL   GFR calc non Af Amer 65 (*) >90 mL/min   GFR calc Af Amer 75 (*) >90 mL/min  URINALYSIS, ROUTINE W REFLEX MICROSCOPIC      Component Value Range   Color, Urine YELLOW  YELLOW   APPearance CLEAR  CLEAR   Specific Gravity, Urine 1.024  1.005 - 1.030   pH 5.5  5.0 - 8.0   Glucose, UA NEGATIVE  NEGATIVE mg/dL   Hgb urine dipstick MODERATE (*) NEGATIVE   Bilirubin Urine NEGATIVE  NEGATIVE   Ketones, ur 15 (*) NEGATIVE mg/dL   Protein, ur NEGATIVE  NEGATIVE mg/dL   Urobilinogen, UA 1.0  0.0 - 1.0 mg/dL   Nitrite NEGATIVE  NEGATIVE   Leukocytes, UA NEGATIVE  NEGATIVE  URINE MICROSCOPIC-ADD ON      Component Value Range   Squamous Epithelial / LPF RARE  RARE   WBC, UA 3-6  <3 WBC/hpf   RBC / HPF 3-6  <3 RBC/hpf   Bacteria, UA FEW (*) RARE   Casts HYALINE CASTS (*) NEGATIVE    Dg Chest 2 View  02/15/2012  *RADIOLOGY REPORT*  Clinical Data: Cough.  Short of breath.  High blood sugar.  CHEST - 2 VIEW  Comparison: 09/06/2011.  08/18/2009.  Findings: Small area of patchy density is present in the right costophrenic angle on the lateral view.  This is anterior to the lower thoracic spine on the lateral view, suspicious for small focus of pneumonia.  Atelectasis is considered less likely.  Negative for effusion.  Cardiopericardial silhouette and mediastinal contours appear within normal  limits.  IMPRESSION: Small area of patchy right lower lobe airspace opacity suspicious for pneumonia.   Original Report Authenticated By: Andreas Newport, M.D.      1. Asthma   2. Diabetes mellitus   3. Community acquired pneumonia   4. Hematuria       MDM  Patient presented with fatigue and cough and blood sugars in the ED. Blood sugars stable after triage. Patient has asthma and states that she has had problems with water damage and black mold in her home. Patient reported 5 episodes of nausea, fluids given. Patient given breathing treatment in ED. CXR: remarkable for pneumonia, first dose of azithromycin given in ED. CBC & CMP: unremarkable. UA: positive for moderate blood. Patient advised to follow-up with primary care for further evaluation. Patient discharged on Azithromycin. Glucose stable and patient able to ambulate without desaturation. Return precautions given. No red flags for bronchospasm or pneumothorax.       Pixie Casino, PA-C 02/15/12 1026

## 2012-02-15 NOTE — ED Notes (Signed)
PT. REPORTS ELEVATED BLOOD SUGAR AT HOME THIS MORNING = 517 , STATES ELEVATED BLOOD SUGAR FOR THE PAST 2 DAYS , TOOK HER METFORMIN 1000 MG THIS PRIOR TO ARRIVAL , SLIGHT HEADACHE WITH GENERALIZED WEAKNESS AND VOMITTING.  ,

## 2012-02-15 NOTE — ED Notes (Signed)
Pt sleeping. Awakens easily. abx infusion competed

## 2012-02-15 NOTE — ED Notes (Signed)
o2 sat at 100% with ambulation

## 2012-02-15 NOTE — ED Notes (Signed)
Meal tray given per pt and MD request. Pt states she is feeling better

## 2012-02-15 NOTE — ED Notes (Signed)
HHN administered. Pt denies pain at present, is worried because her apt has mold in it.

## 2012-02-15 NOTE — ED Notes (Signed)
Patient transported to X-ray 

## 2012-02-15 NOTE — ED Notes (Signed)
CBG CHECKED 103

## 2012-02-15 NOTE — ED Provider Notes (Signed)
Medical screening examination/treatment/procedure(s) were conducted as a shared visit with non-physician practitioner(s) and myself.  I personally evaluated the patient during the encounter  Diabetic on metformin only presenting with hyperglycemia. States compliance with medications. Glucose within normal range here. No chest pain, shortness of breath, fever, nausea or vomiting. Possible CAP on CXR.  No SOB, fever, leukocytosis  Glynn Octave, MD 02/15/12 1724

## 2012-04-03 ENCOUNTER — Emergency Department (INDEPENDENT_AMBULATORY_CARE_PROVIDER_SITE_OTHER)
Admission: EM | Admit: 2012-04-03 | Discharge: 2012-04-03 | Disposition: A | Discharge status: Home / Self Care | Payer: PRIVATE HEALTH INSURANCE | Source: Home / Self Care | Attending: Emergency Medicine | Admitting: Emergency Medicine

## 2012-04-03 ENCOUNTER — Encounter (HOSPITAL_COMMUNITY): Payer: Self-pay | Admitting: *Deleted

## 2012-04-03 DIAGNOSIS — M25569 Pain in unspecified knee: Secondary | ICD-10-CM

## 2012-04-03 DIAGNOSIS — G8929 Other chronic pain: Secondary | ICD-10-CM

## 2012-04-03 MED ORDER — KETOROLAC TROMETHAMINE 30 MG/ML IJ SOLN
60.0000 mg | Freq: Once | INTRAMUSCULAR | Status: AC
  Administered 2012-04-03: 60 mg via INTRAMUSCULAR

## 2012-04-03 MED ORDER — KETOROLAC TROMETHAMINE 60 MG/2ML IM SOLN
INTRAMUSCULAR | Status: AC
  Filled 2012-04-03: qty 2

## 2012-04-03 NOTE — ED Notes (Signed)
Pt  Has  Chronic  Knee  Pain  She  Reports  She  Is  Seeing  An orthopedist     She reports   Pain  X  3 days    She  denys  Any  Recent injury   She  Is  ambulatory     With a  Slow  Steady  Gait

## 2012-04-03 NOTE — ED Provider Notes (Addendum)
History     CSN: [id number]  Arrival date & time 04/03/12  0804   First MD Initiated Contact with Patient 04/03/12 0815      Chief Complaint  Patient presents with  . Knee Pain    (Consider location/radiation/quality/duration/timing/severity/associated sxs/prior treatment) HPI Comments: Patient presents urgent care describing right medial pain exacerbation she is in discussions with her orthopedic provider for surgical intervention, she's been very busy at school and described that over the weekend she was very active physically and has been unable to control her discomfort and pain at home. She presented this morning to urgent care requesting a shot of Toradol and clarified that she does not want any prescriptions. She also described that she needed a doctor's note and she was supposed to appear in court today, given to her pain and discomfort she was unable to.  Patient is a 51 y.o. female presenting with knee pain. The history is provided by the patient.  Knee Pain This is a recurrent problem. The current episode started more than 1 week ago. The problem occurs constantly. The problem has not changed since onset.Pertinent negatives include no chest pain. The symptoms are aggravated by twisting, standing and walking. Nothing relieves the symptoms. Treatments tried: On oxycodone chronically and had pedal for breakthrough. The treatment provided no relief.    Past Medical History  Diagnosis Date  . History of knee replacement, total   . Fever   . Breast lump   . Breast discharge   . Breast pain   . Diabetes mellitus   . Thyroid disease   . Asthma   . Hiatal hernia   . Arthritis   . Depression   . Hypertension   . Grave's disease   . Bipolar 1 disorder     Past Surgical History  Procedure Date  . Cesarean section   . Breast lumpectomy     right  . Hernia repair   . Appendectomy   . Abdominal hysterectomy     partial  . Knee surgery     right  . Myomectomy abdominal  approach 1989  . Myomectomy abdominal approach 1989    Family History  Problem Relation Age of Onset  . Cancer Father     colon  . Stroke Father   . Heart disease Father   . Depression Sister     History  Substance Use Topics  . Smoking status: Former Smoker -- 0.5 packs/day for 4 years    Types: Cigarettes    Quit date: 11/13/2010  . Smokeless tobacco: Never Used  . Alcohol Use: No    OB History    Grav Para Term Preterm Abortions TAB SAB Ect Mult Living                  Review of Systems  Constitutional: Negative for fever.  Cardiovascular: Negative for chest pain.  Musculoskeletal: Positive for joint swelling.  Skin: Negative for color change, pallor, rash and wound.  Neurological: Negative for weakness and numbness.    Allergies  Aspirin and Sulphur  Home Medications   Current Outpatient Rx  Name Route Sig Dispense Refill  . ALBUTEROL SULFATE HFA 108 (90 BASE) MCG/ACT IN AERS Inhalation Inhale 2 puffs into the lungs every 6 (six) hours as needed. Shortness of breath    . ARIPIPRAZOLE 5 MG PO TABS Oral Take 5 mg by mouth at bedtime.     Marland Kitchen SAPHRIS SL Sublingual Place 1 tablet under the tongue at bedtime.    Marland Kitchen  BUPROPION HCL ER (XL) 300 MG PO TB24 Oral Take 450 mg by mouth daily.     . BUSPIRONE HCL 10 MG PO TABS Oral Take 5-10 mg by mouth 4 (four) times daily.    Marland Kitchen DIAZEPAM 10 MG PO TABS Oral Take 10 mg by mouth 3 (three) times daily.    Marland Kitchen DICLOFENAC SODIUM 1 % TD GEL Topical Apply 2 g topically 4 (four) times daily. Right knee    . IBUPROFEN 200 MG PO TABS Oral Take 800 mg by mouth every 6 (six) hours as needed. For pain    . LEVOTHYROXINE SODIUM 125 MCG PO TABS Oral Take 125 mcg by mouth daily.      Marland Kitchen LIDOCAINE 0.5 % EX GEL Apply externally Apply 1 application topically 2 (two) times daily. Right knee    . LISINOPRIL-HYDROCHLOROTHIAZIDE 20-12.5 MG PO TABS Oral Take 1 tablet by mouth 2 (two) times daily.     Marland Kitchen METFORMIN HCL 1000 MG PO TABS Oral Take 1,000 mg by  mouth 2 (two) times daily with a meal.    . NIACIN 500 MG PO TABS Oral Take 500 mg by mouth daily with breakfast.    . OMEPRAZOLE 40 MG PO CPDR Oral Take 40 mg by mouth daily.    . OXYCODONE HCL ER 10 MG PO TB12 Oral Take 10 mg by mouth 3 (three) times daily.    Marland Kitchen ROSUVASTATIN CALCIUM 20 MG PO TABS Oral Take 20 mg by mouth daily.      BP 158/98  Pulse 72  Temp 98.6 F (37 C)  Resp 18  SpO2 100%  Physical Exam  Nursing note and vitals reviewed. Constitutional: Vital signs are normal.  Non-toxic appearance. She does not have a sickly appearance. She does not appear ill. No distress.  Musculoskeletal: She exhibits tenderness.       Legs: Neurological: She is alert.  Skin: No rash noted. She is not diaphoretic. No erythema.    ED Course  Procedures (including critical care time)  Labs Reviewed - No data to display No results found.   No diagnosis found.    MDM   Right knee pain or exacerbation. Patient sees Dr. due to a chronic knee problems post knee replacement. He exam patient has a stable knee, reporting exacerbated physical activity that is causing an exacerbation of her right medial knee pain. Patient requested a Toradol shot as that tends to help her the most when she's going through a period like this. She is currently taking oxycodone and Motrin as well plus a diclofenac gel. I have reviewed with her given history of aspirin allergies but there is always a possibility of cross sensitivity with Toradol which she described that she has had this shot before with no problems.       Jimmie Molly, MD 04/03/12 1610  Jimmie Molly, MD 04/03/12 [phone]

## 2012-06-05 ENCOUNTER — Other Ambulatory Visit (HOSPITAL_COMMUNITY): Payer: Self-pay | Admitting: Orthopedic Surgery

## 2012-06-13 ENCOUNTER — Encounter (HOSPITAL_COMMUNITY): Payer: Self-pay | Admitting: Respiratory Therapy

## 2012-06-20 ENCOUNTER — Encounter (HOSPITAL_COMMUNITY): Payer: Self-pay | Admitting: Vascular Surgery

## 2012-06-20 ENCOUNTER — Encounter (HOSPITAL_COMMUNITY): Payer: Self-pay

## 2012-06-20 ENCOUNTER — Encounter (HOSPITAL_COMMUNITY)
Admission: RE | Admit: 2012-06-20 | Discharge: 2012-06-20 | Disposition: A | Discharge status: Home / Self Care | Payer: PRIVATE HEALTH INSURANCE | Source: Ambulatory Visit | Attending: Orthopedic Surgery | Admitting: Orthopedic Surgery

## 2012-06-20 HISTORY — DX: Hypothyroidism, unspecified: E03.9

## 2012-06-20 LAB — COMPREHENSIVE METABOLIC PANEL
ALT: 13 U/L (ref 0–35)
AST: 13 U/L (ref 0–37)
Albumin: 3.9 g/dL (ref 3.5–5.2)
Alkaline Phosphatase: 97 U/L (ref 39–117)
BUN: 19 mg/dL (ref 6–23)
CO2: 28 mEq/L (ref 19–32)
Calcium: 9.8 mg/dL (ref 8.4–10.5)
Chloride: 101 mEq/L (ref 96–112)
Creatinine, Ser: 0.91 mg/dL (ref 0.50–1.10)
GFR calc Af Amer: 83 mL/min — ABNORMAL LOW (ref >90)
GFR calc non Af Amer: 71 mL/min — ABNORMAL LOW (ref >90)
Glucose, Bld: 88 mg/dL (ref 70–99)
Potassium: 3.1 mEq/L — ABNORMAL LOW (ref 3.5–5.1)
Sodium: 139 mEq/L (ref 135–145)
Total Bilirubin: 0.2 mg/dL — ABNORMAL LOW (ref 0.3–1.2)
Total Protein: 7.9 g/dL (ref 6.0–8.3)

## 2012-06-20 LAB — APTT: aPTT: 33 seconds (ref 24–37)

## 2012-06-20 LAB — CBC
HCT: 39.3 % (ref 36.0–46.0)
Hemoglobin: 12.9 g/dL (ref 12.0–15.0)
MCH: 26.2 pg (ref 26.0–34.0)
MCHC: 32.8 g/dL (ref 30.0–36.0)
MCV: 79.7 fL (ref 78.0–100.0)
Platelets: 240 10*3/uL (ref 150–400)
RBC: 4.93 MIL/uL (ref 3.87–5.11)
RDW: 16.2 % — ABNORMAL HIGH (ref 11.5–15.5)
WBC: 6.8 10*3/uL (ref 4.0–10.5)

## 2012-06-20 LAB — PROTIME-INR
INR: 0.91 (ref 0.00–1.49)
Prothrombin Time: 12.2 seconds (ref 11.6–15.2)

## 2012-06-20 LAB — NO BLOOD PRODUCTS

## 2012-06-20 LAB — SURGICAL PCR SCREEN
MRSA, PCR: NEGATIVE
Staphylococcus aureus: NEGATIVE

## 2012-06-20 MED ORDER — DEXTROSE 5 % IV SOLN
3.0000 g | INTRAVENOUS | Status: AC
  Administered 2012-06-21: 3 g via INTRAVENOUS
  Filled 2012-06-20: qty 3000

## 2012-06-20 NOTE — Progress Notes (Addendum)
Pt here for PAT appt. Family MD:  Cammie Fulp.  Cardiologist: Dr. Verdis Prime w/ [facility].  Reports having stress test over 1 year ago and heart cath over 3 years ago.  Faxed req  Of copies of EKG, Stress test, Heart cath, & last OV note from [facility].  Pt refuses to accept blood due to religious beliefs.  Pt signed refusal of blood consent which was then faxed to blood bank.  Autumn, Dr. Audrie Lia nurse, told that pt refuses blood.  Autumn stated she would let Dr. Lajoyce Corners know.  Pt also req seeing social worker before leaving today.  Social Work and Case Management(Dora) notified.  They stated that pt has to wait till admitted(doctor's orders) until they could see her.  Pt notfied and told to let nursing staff notify case management once she is admitted. Pt stated she would and this was acceptable to her. X-large knee highs ordered.

## 2012-06-20 NOTE — Progress Notes (Signed)
Received copies of last OV note, stress test, EKG, and heart catherization.  Pt had abnormal EKG. Chart given to A. Zelenak, PA, to review.

## 2012-06-20 NOTE — Pre-Procedure Instructions (Signed)
20 Caroline Williams  06/20/2012   Your procedure is scheduled on:  Wednesday, January 8th.  Report to [facility] at 6:30 AM.  Call this number if you have problems the morning of surgery: [phone]   Remember:Nothing to eat or drink after Midnight.     Take these medicines the morning of surgery with A SIP OF WATER: Bupropion (Wellbutrin), Buspirone (Buspar), Diazepam (Valium), Levothyroxine (Synthyroid), Omeprazole (Prilosec).  May use  Albuterol Inhaler and bring to there hospital the day of surgery.  May take Oxycodone if needed.    Do not wear jewelry, make-up or nail polish.  Do not wear lotions, powders, or perfumes. You may wear deodorant.  Do not shave 48 hours prior to surgery. Men may shave face and neck.  Do not bring valuables to the hospital.  Contacts, dentures or bridgework may not be worn into surgery.  Leave suitcase in the car. After surgery it may be brought to your room.  For patients admitted to the hospital, checkout time is 11:00 AM the day of discharge.   Patients discharged the day of surgery will not be allowed to drive home.  Name and phone number of your driver: NA    Special Instructions: Shower with CHG wash (Bactoshield) tonight and again in the am prior to arriving to hospital.    Please read over the following fact sheets that you were given: Pain Booklet, Coughing and Deep Breathing, Blood Transfusion Information and Surgical Site Infection Prevention

## 2012-06-20 NOTE — Consult Note (Signed)
Anesthesia Chart Review:  Patient is a 52 year old female scheduled for revision of right TKA by Dr. Lajoyce Corners on 06/21/12.  PAT visit was on 06/20/12.    History includes morbid obesity (BMI 47.9), former smoker, HTN, DM type 2, asthma, hiatal hernia, depression, Bipolar type 1, hypothyroidism, OSA, right breast lumpectomy.  PCP is Dr. Raford Pitcher.  She has been evaluated by Cardiologist Dr. Verdis Prime [facility]), last in 02/2008.  She had widely patent coronaries without evidence of significant atherosclerosis, normal LV function by cath on 11/08/06 (cath done due to abnormal stress test on 12/25/07).  EKG on 06/20/12 showed SR with first degree AVB, cannot rule out anterior infarct (age undetermined).  Borderline LAD.  When compared to her EKG at the time of her stress test on 12/25/07, the first degree AVB and borderline LAD are new.   CXR on 06/20/12 showed no edema or consolidation.  Mild scarring in the bases bilaterally.  Preoperative labs noted.  H/H 12.9/39.3.  SHE HAS REFUSED ALL BLOOD PRODUCTS.  She will be evaluated by her assigned anesthesiologist on the day of surgery.  If not new/acute cardiopulmonary symptoms then would anticipate she could proceed as planned.  Shonna Chock, PA-C 06/20/12 1438

## 2012-06-21 ENCOUNTER — Ambulatory Visit (HOSPITAL_COMMUNITY): Payer: PRIVATE HEALTH INSURANCE | Admitting: Vascular Surgery

## 2012-06-21 ENCOUNTER — Encounter (HOSPITAL_COMMUNITY): Payer: Self-pay | Admitting: Vascular Surgery

## 2012-06-21 ENCOUNTER — Encounter (HOSPITAL_COMMUNITY)
Admission: RE | Disposition: A | Discharge status: Home Health | Payer: Self-pay | Source: Ambulatory Visit | Attending: Orthopedic Surgery

## 2012-06-21 ENCOUNTER — Inpatient Hospital Stay (HOSPITAL_COMMUNITY)
Admission: RE | Admit: 2012-06-21 | Discharge: 2012-06-23 | DRG: 468 | Disposition: A | Discharge status: Home Health | Payer: PRIVATE HEALTH INSURANCE | Source: Ambulatory Visit | Attending: Orthopedic Surgery | Admitting: Orthopedic Surgery

## 2012-06-21 ENCOUNTER — Encounter (HOSPITAL_COMMUNITY): Payer: Self-pay | Admitting: *Deleted

## 2012-06-21 ENCOUNTER — Ambulatory Visit (HOSPITAL_COMMUNITY): Payer: PRIVATE HEALTH INSURANCE

## 2012-06-21 DIAGNOSIS — Z7901 Long term (current) use of anticoagulants: Secondary | ICD-10-CM

## 2012-06-21 DIAGNOSIS — Z87891 Personal history of nicotine dependence: Secondary | ICD-10-CM

## 2012-06-21 DIAGNOSIS — E039 Hypothyroidism, unspecified: Secondary | ICD-10-CM | POA: Diagnosis present

## 2012-06-21 DIAGNOSIS — Z79899 Other long term (current) drug therapy: Secondary | ICD-10-CM

## 2012-06-21 DIAGNOSIS — Z8249 Family history of ischemic heart disease and other diseases of the circulatory system: Secondary | ICD-10-CM

## 2012-06-21 DIAGNOSIS — Y831 Surgical operation with implant of artificial internal device as the cause of abnormal reaction of the patient, or of later complication, without mention of misadventure at the time of the procedure: Secondary | ICD-10-CM | POA: Diagnosis present

## 2012-06-21 DIAGNOSIS — Z823 Family history of stroke: Secondary | ICD-10-CM

## 2012-06-21 DIAGNOSIS — Z01812 Encounter for preprocedural laboratory examination: Secondary | ICD-10-CM

## 2012-06-21 DIAGNOSIS — E119 Type 2 diabetes mellitus without complications: Secondary | ICD-10-CM | POA: Diagnosis present

## 2012-06-21 DIAGNOSIS — Z96659 Presence of unspecified artificial knee joint: Secondary | ICD-10-CM

## 2012-06-21 DIAGNOSIS — J45909 Unspecified asthma, uncomplicated: Secondary | ICD-10-CM | POA: Diagnosis present

## 2012-06-21 DIAGNOSIS — T84099A Other mechanical complication of unspecified internal joint prosthesis, initial encounter: Principal | ICD-10-CM | POA: Diagnosis present

## 2012-06-21 DIAGNOSIS — T84018A Broken internal joint prosthesis, other site, initial encounter: Secondary | ICD-10-CM

## 2012-06-21 DIAGNOSIS — Y92009 Unspecified place in unspecified non-institutional (private) residence as the place of occurrence of the external cause: Secondary | ICD-10-CM

## 2012-06-21 DIAGNOSIS — F319 Bipolar disorder, unspecified: Secondary | ICD-10-CM | POA: Diagnosis present

## 2012-06-21 DIAGNOSIS — I10 Essential (primary) hypertension: Secondary | ICD-10-CM | POA: Diagnosis present

## 2012-06-21 HISTORY — PX: TOTAL KNEE REVISION: SHX996

## 2012-06-21 LAB — GLUCOSE, CAPILLARY
Glucose-Capillary: 113 mg/dL — ABNORMAL HIGH (ref 70–99)
Glucose-Capillary: 122 mg/dL — ABNORMAL HIGH (ref 70–99)
Glucose-Capillary: 177 mg/dL — ABNORMAL HIGH (ref 70–99)
Glucose-Capillary: 148 mg/dL — ABNORMAL HIGH (ref 70–99)

## 2012-06-21 SURGERY — TOTAL KNEE REVISION
Anesthesia: General | Site: Knee | Laterality: Right | Wound class: Clean

## 2012-06-21 MED ORDER — ONDANSETRON HCL 4 MG PO TABS: 4.0000 mg | ORAL_TABLET | Freq: Four times a day (QID) | ORAL | Status: DC | PRN

## 2012-06-21 MED ORDER — OXYCODONE HCL 10 MG PO TB12: 10.0000 mg | ORAL_TABLET | Freq: Three times a day (TID) | ORAL | Status: DC

## 2012-06-21 MED ORDER — ARIPIPRAZOLE 5 MG PO TABS
5.0000 mg | ORAL_TABLET | Freq: Every day | ORAL | Status: DC
  Administered 2012-06-21 – 2012-06-23 (×2): 5 mg via ORAL
  Filled 2012-06-21 (×4): qty 1

## 2012-06-21 MED ORDER — OXYCODONE HCL 5 MG PO TABS: 5.0000 mg | ORAL_TABLET | Freq: Once | ORAL | Status: DC | PRN

## 2012-06-21 MED ORDER — LIDOCAINE HCL (CARDIAC) 20 MG/ML IV SOLN
INTRAVENOUS | Status: DC | PRN
  Administered 2012-06-21: 40 mg via INTRAVENOUS

## 2012-06-21 MED ORDER — OXYCODONE HCL 5 MG/5ML PO SOLN: 5.0000 mg | Freq: Once | ORAL | Status: DC | PRN

## 2012-06-21 MED ORDER — ONDANSETRON HCL 4 MG/2ML IJ SOLN
INTRAMUSCULAR | Status: DC | PRN
  Administered 2012-06-21: 4 mg via INTRAVENOUS

## 2012-06-21 MED ORDER — METOCLOPRAMIDE HCL 10 MG PO TABS: 5.0000 mg | ORAL_TABLET | Freq: Three times a day (TID) | ORAL | Status: DC | PRN

## 2012-06-21 MED ORDER — WARFARIN - PHARMACIST DOSING INPATIENT: Freq: Every day | Status: DC

## 2012-06-21 MED ORDER — INSULIN ASPART 100 UNIT/ML ~~LOC~~ SOLN
0.0000 [IU] | Freq: Three times a day (TID) | SUBCUTANEOUS | Status: DC
  Administered 2012-06-21: 3 [IU] via SUBCUTANEOUS
  Administered 2012-06-22: 2 [IU] via SUBCUTANEOUS
  Administered 2012-06-22: 3 [IU] via SUBCUTANEOUS

## 2012-06-21 MED ORDER — FENTANYL CITRATE 0.05 MG/ML IJ SOLN
INTRAMUSCULAR | Status: DC | PRN
  Administered 2012-06-21 (×2): 50 ug via INTRAVENOUS
  Administered 2012-06-21 (×3): 100 ug via INTRAVENOUS
  Administered 2012-06-21 (×2): 50 ug via INTRAVENOUS

## 2012-06-21 MED ORDER — ATORVASTATIN CALCIUM 10 MG PO TABS
10.0000 mg | ORAL_TABLET | Freq: Every day | ORAL | Status: DC
  Administered 2012-06-21 – 2012-06-22 (×2): 10 mg via ORAL
  Filled 2012-06-21 (×3): qty 1

## 2012-06-21 MED ORDER — CEFAZOLIN SODIUM-DEXTROSE 2-3 GM-% IV SOLR
2.0000 g | Freq: Four times a day (QID) | INTRAVENOUS | Status: AC
  Administered 2012-06-21 – 2012-06-22 (×3): 2 g via INTRAVENOUS
  Filled 2012-06-21 (×3): qty 50

## 2012-06-21 MED ORDER — OXYCODONE-ACETAMINOPHEN 5-325 MG PO TABS
1.0000 | ORAL_TABLET | ORAL | Status: DC | PRN
  Administered 2012-06-21 – 2012-06-23 (×5): 2 via ORAL
  Filled 2012-06-21 (×6): qty 2

## 2012-06-21 MED ORDER — PROMETHAZINE HCL 25 MG/ML IJ SOLN: 6.2500 mg | INTRAMUSCULAR | Status: DC | PRN

## 2012-06-21 MED ORDER — LISINOPRIL-HYDROCHLOROTHIAZIDE 20-12.5 MG PO TABS: 1.0000 | ORAL_TABLET | Freq: Two times a day (BID) | ORAL | Status: DC

## 2012-06-21 MED ORDER — ALBUTEROL SULFATE HFA 108 (90 BASE) MCG/ACT IN AERS: 2.0000 | INHALATION_SPRAY | Freq: Four times a day (QID) | RESPIRATORY_TRACT | Status: DC | PRN

## 2012-06-21 MED ORDER — MIDAZOLAM HCL 5 MG/5ML IJ SOLN
INTRAMUSCULAR | Status: DC | PRN
  Administered 2012-06-21: 2 mg via INTRAVENOUS

## 2012-06-21 MED ORDER — OXYCODONE HCL ER 10 MG PO T12A
10.0000 mg | EXTENDED_RELEASE_TABLET | Freq: Three times a day (TID) | ORAL | Status: DC
  Administered 2012-06-21 – 2012-06-23 (×7): 10 mg via ORAL
  Filled 2012-06-21 (×9): qty 1

## 2012-06-21 MED ORDER — PANTOPRAZOLE SODIUM 40 MG PO TBEC
40.0000 mg | DELAYED_RELEASE_TABLET | Freq: Every day | ORAL | Status: DC
  Administered 2012-06-21 – 2012-06-23 (×3): 40 mg via ORAL
  Filled 2012-06-21 (×3): qty 1

## 2012-06-21 MED ORDER — HYDROMORPHONE HCL PF 1 MG/ML IJ SOLN
0.5000 mg | INTRAMUSCULAR | Status: DC | PRN
Start: 2012-06-21 — End: 2012-06-23
  Administered 2012-06-21 – 2012-06-23 (×4): 1 mg via INTRAVENOUS
  Filled 2012-06-21 (×4): qty 1

## 2012-06-21 MED ORDER — COUMADIN BOOK
Freq: Once | Status: AC
  Administered 2012-06-21: 17:00:00
  Filled 2012-06-21: qty 1

## 2012-06-21 MED ORDER — HYDROMORPHONE HCL PF 1 MG/ML IJ SOLN
INTRAMUSCULAR | Status: AC
  Filled 2012-06-21: qty 1

## 2012-06-21 MED ORDER — METOCLOPRAMIDE HCL 5 MG/ML IJ SOLN: 5.0000 mg | Freq: Three times a day (TID) | INTRAMUSCULAR | Status: DC | PRN

## 2012-06-21 MED ORDER — SODIUM CHLORIDE 0.9 % IR SOLN
Status: DC | PRN
  Administered 2012-06-21: 3000 mL
  Administered 2012-06-21: 1000 mL

## 2012-06-21 MED ORDER — HYDROCHLOROTHIAZIDE 12.5 MG PO CAPS
12.5000 mg | ORAL_CAPSULE | Freq: Every day | ORAL | Status: DC
  Administered 2012-06-21 – 2012-06-23 (×3): 12.5 mg via ORAL
  Filled 2012-06-21 (×3): qty 1

## 2012-06-21 MED ORDER — SODIUM CHLORIDE 0.9 % IV SOLN
INTRAVENOUS | Status: DC
  Administered 2012-06-21: 20 mL/h via INTRAVENOUS

## 2012-06-21 MED ORDER — DEXAMETHASONE SODIUM PHOSPHATE 4 MG/ML IJ SOLN
INTRAMUSCULAR | Status: DC | PRN
  Administered 2012-06-21: 10 mg

## 2012-06-21 MED ORDER — LABETALOL HCL 5 MG/ML IV SOLN
INTRAVENOUS | Status: DC | PRN
  Administered 2012-06-21 (×2): 2.5 mg via INTRAVENOUS
  Administered 2012-06-21 (×2): 5 mg via INTRAVENOUS

## 2012-06-21 MED ORDER — HYDROMORPHONE HCL PF 1 MG/ML IJ SOLN
0.2500 mg | INTRAMUSCULAR | Status: DC | PRN
  Administered 2012-06-21 (×2): 0.5 mg via INTRAVENOUS

## 2012-06-21 MED ORDER — BUSPIRONE HCL 5 MG PO TABS
5.0000 mg | ORAL_TABLET | Freq: Four times a day (QID) | ORAL | Status: DC
  Administered 2012-06-21 – 2012-06-22 (×4): 10 mg via ORAL
  Administered 2012-06-22: 5 mg via ORAL
  Administered 2012-06-22: 10 mg via ORAL
  Administered 2012-06-23 (×2): 5 mg via ORAL
  Filled 2012-06-21 (×10): qty 2

## 2012-06-21 MED ORDER — ONDANSETRON HCL 4 MG/2ML IJ SOLN
4.0000 mg | Freq: Four times a day (QID) | INTRAMUSCULAR | Status: DC | PRN
  Administered 2012-06-23: 4 mg via INTRAVENOUS
  Filled 2012-06-21: qty 2

## 2012-06-21 MED ORDER — METFORMIN HCL 500 MG PO TABS
1000.0000 mg | ORAL_TABLET | Freq: Two times a day (BID) | ORAL | Status: DC
  Administered 2012-06-21 – 2012-06-23 (×4): 1000 mg via ORAL
  Filled 2012-06-21 (×6): qty 2

## 2012-06-21 MED ORDER — TRAZODONE HCL 100 MG PO TABS
100.0000 mg | ORAL_TABLET | Freq: Every day | ORAL | Status: DC
  Administered 2012-06-21 – 2012-06-22 (×2): 100 mg via ORAL
  Filled 2012-06-21 (×3): qty 1

## 2012-06-21 MED ORDER — ARTIFICIAL TEARS OP OINT
TOPICAL_OINTMENT | OPHTHALMIC | Status: DC | PRN
  Administered 2012-06-21: 1 via OPHTHALMIC

## 2012-06-21 MED ORDER — LACTATED RINGERS IV SOLN
INTRAVENOUS | Status: DC | PRN
  Administered 2012-06-21 (×2): via INTRAVENOUS

## 2012-06-21 MED ORDER — LISINOPRIL 20 MG PO TABS
20.0000 mg | ORAL_TABLET | Freq: Every day | ORAL | Status: DC
  Administered 2012-06-21 – 2012-06-23 (×3): 20 mg via ORAL
  Filled 2012-06-21 (×3): qty 1

## 2012-06-21 MED ORDER — BUPROPION HCL ER (XL) 300 MG PO TB24
450.0000 mg | ORAL_TABLET | Freq: Every day | ORAL | Status: DC
  Administered 2012-06-21 – 2012-06-23 (×3): 450 mg via ORAL
  Filled 2012-06-21 (×3): qty 1

## 2012-06-21 MED ORDER — PROPOFOL 10 MG/ML IV BOLUS
INTRAVENOUS | Status: DC | PRN
  Administered 2012-06-21: 200 mg via INTRAVENOUS

## 2012-06-21 MED ORDER — WARFARIN SODIUM 7.5 MG PO TABS
7.5000 mg | ORAL_TABLET | Freq: Once | ORAL | Status: AC
  Administered 2012-06-21: 7.5 mg via ORAL
  Filled 2012-06-21: qty 1

## 2012-06-21 MED ORDER — NIACIN 500 MG PO TABS
500.0000 mg | ORAL_TABLET | Freq: Every day | ORAL | Status: DC
  Administered 2012-06-22 – 2012-06-23 (×2): 500 mg via ORAL
  Filled 2012-06-21 (×3): qty 1

## 2012-06-21 MED ORDER — ASENAPINE MALEATE 5 MG SL SUBL
5.0000 mg | SUBLINGUAL_TABLET | Freq: Two times a day (BID) | SUBLINGUAL | Status: DC
  Administered 2012-06-22 – 2012-06-23 (×3): 5 mg via SUBLINGUAL
  Filled 2012-06-21 (×5): qty 1

## 2012-06-21 MED ORDER — WARFARIN VIDEO: Freq: Once | Status: DC

## 2012-06-21 MED ORDER — LEVOTHYROXINE SODIUM 125 MCG PO TABS
125.0000 ug | ORAL_TABLET | Freq: Every day | ORAL | Status: DC
  Administered 2012-06-22 – 2012-06-23 (×2): 125 ug via ORAL
  Filled 2012-06-21 (×3): qty 1

## 2012-06-21 MED ORDER — DIAZEPAM 5 MG PO TABS
10.0000 mg | ORAL_TABLET | Freq: Three times a day (TID) | ORAL | Status: DC
  Administered 2012-06-21 – 2012-06-23 (×7): 10 mg via ORAL
  Filled 2012-06-21 (×7): qty 2

## 2012-06-21 MED ORDER — BUPIVACAINE-EPINEPHRINE PF 0.5-1:200000 % IJ SOLN
INTRAMUSCULAR | Status: DC | PRN
  Administered 2012-06-21: 150 mg

## 2012-06-21 SURGICAL SUPPLY — 68 items
ARTI SURFACE ZIMMER (Orthopedic Implant) ×1 IMPLANT
BLADE SAW SAG 90X13X1.27 (BLADE) ×2 IMPLANT
BLADE SAW SGTL 13.0X1.19X90.0M (BLADE) ×2 IMPLANT
BLADE SURG 10 STRL SS (BLADE) ×1 IMPLANT
BLOCK TIBIAL FULL AUG SZ 3 10M (Orthopedic Implant) ×1 IMPLANT
BNDG COHESIVE 6X5 TAN STRL LF (GAUZE/BANDAGES/DRESSINGS) ×4 IMPLANT
BONE CEMENT PALACOSE (Orthopedic Implant) ×4 IMPLANT
BOWL SMART MIX CTS (DISPOSABLE) ×1 IMPLANT
CEMENT BONE PALACOSE (Orthopedic Implant) ×2 IMPLANT
CLOTH BEACON ORANGE TIMEOUT ST (SAFETY) ×2 IMPLANT
CO AXIAL FAN SPRAY TIP SOFT SH (MISCELLANEOUS) ×1 IMPLANT
COVER BACK TABLE 24X17X13 BIG (DRAPES) IMPLANT
COVER SURGICAL LIGHT HANDLE (MISCELLANEOUS) ×2 IMPLANT
CUFF TOURNIQUET SINGLE 34IN LL (TOURNIQUET CUFF) IMPLANT
CUFF TOURNIQUET SINGLE 44IN (TOURNIQUET CUFF) IMPLANT
DISTAL AUG ZIMMER (Orthopedic Implant) ×1 IMPLANT
DRAPE EXTREMITY T 121X128X90 (DRAPE) ×2 IMPLANT
DRAPE PROXIMA HALF (DRAPES) ×2 IMPLANT
DRAPE U-SHAPE 47X51 STRL (DRAPES) ×2 IMPLANT
DRSG ADAPTIC 3X8 NADH LF (GAUZE/BANDAGES/DRESSINGS) ×2 IMPLANT
DRSG PAD ABDOMINAL 8X10 ST (GAUZE/BANDAGES/DRESSINGS) ×2 IMPLANT
DURAPREP 26ML APPLICATOR (WOUND CARE) ×2 IMPLANT
ELECT REM PT RETURN 9FT ADLT (ELECTROSURGICAL) ×2
ELECTRODE REM PT RTRN 9FT ADLT (ELECTROSURGICAL) ×1 IMPLANT
FACESHIELD LNG OPTICON STERILE (SAFETY) ×4 IMPLANT
FEMORAL COMP ZIMMER KNEE (Orthopedic Implant) ×1 IMPLANT
GLOVE BIO SURGEON STRL SZ8.5 (GLOVE) ×2 IMPLANT
GLOVE BIOGEL PI IND STRL 7.5 (GLOVE) IMPLANT
GLOVE BIOGEL PI IND STRL 9 (GLOVE) ×1 IMPLANT
GLOVE BIOGEL PI INDICATOR 7.5 (GLOVE) ×1
GLOVE BIOGEL PI INDICATOR 9 (GLOVE) ×1
GLOVE SURG ORTHO 9.0 STRL STRW (GLOVE) ×3 IMPLANT
GLOVE SURG SS PI 6.5 STRL IVOR (GLOVE) ×1 IMPLANT
GLOVE SURG SS PI 8.5 STRL IVOR (GLOVE) ×2
GLOVE SURG SS PI 8.5 STRL STRW (GLOVE) IMPLANT
GOWN PREVENTION PLUS XLARGE (GOWN DISPOSABLE) ×2 IMPLANT
GOWN PREVENTION PLUS XXLARGE (GOWN DISPOSABLE) ×2 IMPLANT
GOWN SRG XL XLNG 56XLVL 4 (GOWN DISPOSABLE) ×2 IMPLANT
GOWN STRL NON-REIN XL XLG LVL4 (GOWN DISPOSABLE) ×4
GOWN STRL REIN XL XLG (GOWN DISPOSABLE) ×1 IMPLANT
HANDPIECE INTERPULSE COAX TIP (DISPOSABLE) ×2
KIT BASIN OR (CUSTOM PROCEDURE TRAY) ×2 IMPLANT
KIT ROOM TURNOVER OR (KITS) ×2 IMPLANT
MANIFOLD NEPTUNE II (INSTRUMENTS) ×2 IMPLANT
NDL SPNL 18GX3.5 QUINCKE PK (NEEDLE) ×1 IMPLANT
NEEDLE SPNL 18GX3.5 QUINCKE PK (NEEDLE) IMPLANT
NS IRRIG 1000ML POUR BTL (IV SOLUTION) ×2 IMPLANT
PACK TOTAL JOINT (CUSTOM PROCEDURE TRAY) ×2 IMPLANT
PAD ARMBOARD 7.5X6 YLW CONV (MISCELLANEOUS) ×4 IMPLANT
PADDING CAST COTTON 6X4 STRL (CAST SUPPLIES) ×2 IMPLANT
PLATE TIB 3 66X42NT ZIER (Orthopedic Implant) IMPLANT
SET HNDPC FAN SPRY TIP SCT (DISPOSABLE) IMPLANT
SPONGE GAUZE 4X4 12PLY (GAUZE/BANDAGES/DRESSINGS) ×2 IMPLANT
STAPLER VISISTAT 35W (STAPLE) ×2 IMPLANT
STEM EXTENSION 13MMX145MM STRL (Stem) ×1 IMPLANT
STEM ST EXT ZIMMER (Stem) ×1 IMPLANT
SUCTION FRAZIER TIP 10 FR DISP (SUCTIONS) IMPLANT
SUT VIC AB 0 CTB1 27 (SUTURE) ×2 IMPLANT
SUT VIC AB 1 CTX 36 (SUTURE) ×4
SUT VIC AB 1 CTX36XBRD ANBCTR (SUTURE) IMPLANT
SUT VIC AB 2-0 CTB1 (SUTURE) ×2 IMPLANT
TIBIA ZIMMER (Orthopedic Implant) ×2 IMPLANT
TOWEL OR 17X24 6PK STRL BLUE (TOWEL DISPOSABLE) ×2 IMPLANT
TOWEL OR 17X26 10 PK STRL BLUE (TOWEL DISPOSABLE) ×2 IMPLANT
TRAY FOLEY CATH 14FR (SET/KITS/TRAYS/PACK) ×1 IMPLANT
TUBE ANAEROBIC SPECIMEN COL (MISCELLANEOUS) ×1 IMPLANT
WATER STERILE IRR 1000ML POUR (IV SOLUTION) ×2 IMPLANT
WRAP KNEE MAXI GEL POST OP (GAUZE/BANDAGES/DRESSINGS) ×2 IMPLANT

## 2012-06-21 NOTE — Anesthesia Postprocedure Evaluation (Signed)
Anesthesia Post Note  Patient: Caroline Williams  Procedure(s) Performed: Procedure(s) (LRB): TOTAL KNEE REVISION (Right)  Anesthesia type: general  Patient location: PACU  Post pain: Pain level controlled  Post assessment: Patient's Cardiovascular Status Stable  Last Vitals:  Filed Vitals:   06/21/12 1201  BP: 134/86  Pulse: 68  Temp:   Resp:     Post vital signs: Reviewed and stable  Level of consciousness: sedated  Complications: No apparent anesthesia complications

## 2012-06-21 NOTE — Transfer of Care (Signed)
Immediate Anesthesia Transfer of Care Note  Patient: Caroline Williams  Procedure(s) Performed: Procedure(s) (LRB) with comments: TOTAL KNEE REVISION (Right) - Revision Right Total Knee Arthroplasty  Patient Location: PACU  Anesthesia Type:General  Level of Consciousness: awake, alert  and oriented  Airway & Oxygen Therapy: Patient Spontanous Breathing and Patient connected to nasal cannula oxygen  Post-op Assessment: Report given to PACU RN  Post vital signs: Reviewed and stable  Complications: No apparent anesthesia complications

## 2012-06-21 NOTE — Progress Notes (Signed)
ANTICOAGULATION CONSULT NOTE - Initial Consult  Pharmacy Consult for Coumadin Indication: VTE prophylaxis  Allergies  Allergen Reactions  . Aspirin Anaphylaxis  . Sulfa Antibiotics Anaphylaxis    Patient Measurements:   Weight ~ 130 kg Height ~ 65"  Vital Signs: Temp: 97.7 F (36.5 C) (01/08 1452) Temp src: Oral (01/08 0621) BP: 145/90 mmHg (01/08 1452) Pulse Rate: 82  (01/08 1452)  Labs:  Basename 06/20/12 0946  HGB 12.9  HCT 39.3  PLT 240  APTT 33  LABPROT 12.2  INR 0.91  HEPARINUNFRC --  CREATININE 0.91  CKTOTAL --  CKMB --  TROPONINI --    The CrCl is unknown because both a height and weight (above a minimum accepted value) are required for this calculation.   Medical History: Past Medical History  Diagnosis Date  . History of knee replacement, total   . Fever   . Breast lump   . Breast discharge   . Breast pain   . Diabetes mellitus   . Thyroid disease   . Asthma   . Hiatal hernia   . Arthritis   . Depression   . Hypertension   . Bipolar 1 disorder   . Hypothyroidism   . Sleep apnea     Medications:  Prescriptions prior to admission  Medication Sig Dispense Refill  . albuterol (PROVENTIL HFA;VENTOLIN HFA) 108 (90 BASE) MCG/ACT inhaler Inhale 2 puffs into the lungs every 6 (six) hours as needed. Shortness of breath      . ARIPiprazole (ABILIFY) 5 MG tablet Take 5 mg by mouth at bedtime.       . Asenapine Maleate (SAPHRIS SL) Place 2 tablets under the tongue at bedtime.       Marland Kitchen buPROPion (WELLBUTRIN XL) 300 MG 24 hr tablet Take 450 mg by mouth daily.       . busPIRone (BUSPAR) 10 MG tablet Take 5-10 mg by mouth 4 (four) times daily.      . diazepam (VALIUM) 10 MG tablet Take 10 mg by mouth 3 (three) times daily.      . diclofenac sodium (VOLTAREN) 1 % GEL Apply 2 g topically 4 (four) times daily. Right knee      . ibuprofen (ADVIL,MOTRIN) 200 MG tablet Take 800 mg by mouth every 6 (six) hours as needed. For pain      . levothyroxine  (SYNTHROID, LEVOTHROID) 125 MCG tablet Take 125 mcg by mouth daily.        . Lidocaine 0.5 % GEL Apply 1 application topically 2 (two) times daily. Right knee      . lisinopril-hydrochlorothiazide (PRINZIDE,ZESTORETIC) 20-12.5 MG per tablet Take 1 tablet by mouth 2 (two) times daily.       . metFORMIN (GLUCOPHAGE) 1000 MG tablet Take 1,000 mg by mouth 2 (two) times daily with a meal.      . niacin 500 MG tablet Take 500 mg by mouth daily with breakfast.      . omeprazole (PRILOSEC) 40 MG capsule Take 40 mg by mouth daily.      Marland Kitchen oxyCODONE (OXYCONTIN) 10 MG 12 hr tablet Take 10 mg by mouth 3 (three) times daily.      . rosuvastatin (CRESTOR) 20 MG tablet Take 20 mg by mouth daily.      . traZODone (DESYREL) 100 MG tablet Take 100 mg by mouth at bedtime.        Assessment: 52 yo F admitted 06/21/2012 for revision of R TKA.  To begin Coumadin for post-op  VTE prophylaxis.  Baseline INR 0.91.  Goal of Therapy:  INR 2-3 Monitor platelets by anticoagulation protocol: Yes   Plan:  Coumadin 7.5mg  PO x 1 tonight. Daily INR. Coumadin book and video.   [employer], Pharm.D., BCPS Clinical Pharmacist Pager [phone] 06/21/2012 4:29 PM

## 2012-06-21 NOTE — Anesthesia Procedure Notes (Signed)
Anesthesia Regional Block:  Femoral nerve block  Pre-Anesthetic Checklist: ,, timeout performed, Correct Patient, Correct Site, Correct Laterality, Correct Procedure, Correct Position, site marked, Risks and benefits discussed,  Surgical consent,  Pre-op evaluation,  At surgeon's request and post-op pain management  Laterality: Right  Prep: chloraprep       Needles:  Injection technique: Single-shot  Needle Type: Echogenic Stimulator Needle     Needle Length: 5cm 5 cm Needle Gauge: 22 and 22 G    Additional Needles:  Procedures: ultrasound guided (picture in chart) and nerve stimulator Femoral nerve block  Nerve Stimulator or Paresthesia:  Response: quadraceps contraction, 0.45 mA,   Additional Responses:   Narrative:  Start time: 06/21/2012 8:03 AM End time: 06/21/2012 8:13 AM Injection made incrementally with aspirations every 5 mL.  Performed by: Personally  Anesthesiologist: Halford Decamp, MD  Additional Notes: Functioning IV was confirmed and monitors were applied.  A 50mm 22ga Arrow echogenic stimulator needle was used. Sterile prep and drape,hand hygiene and sterile gloves were used. Ultrasound guidance: relevant anatomy identified, needle position confirmed, local anesthetic spread visualized around nerve(s)., vascular puncture avoided.  Image printed for medical record. Negative aspiration and negative test dose prior to incremental administration of local anesthetic. The patient tolerated the procedure well.    Femoral nerve block

## 2012-06-21 NOTE — Preoperative (Signed)
Beta Blockers   Reason not to administer Beta Blockers:Not Applicable 

## 2012-06-21 NOTE — Progress Notes (Signed)
Utilization review completed. Esperanza Madrazo, RN, BSN. 

## 2012-06-21 NOTE — H&P (Signed)
TOTAL KNEE REVISION ADMISSION H&P  Patient is being admitted for right revision total knee arthroplasty.  Subjective:  Chief Complaint:right knee pain.  HPI: Caroline Williams, 52 y.o. female, has a history of pain and functional disability in the right knee(s) due to failed previous arthroplasty and patient has failed non-surgical conservative treatments for greater than 12 weeks to include NSAID's and/or analgesics, use of assistive devices and activity modification. The indications for the revision of the total knee arthroplasty are loosening of one or more components. Onset of symptoms was gradual starting 6 years ago with gradually worsening course since that time.  Prior procedures on the right knee(s) include arthroplasty.  Patient currently rates pain in the right knee(s) at 8 out of 10 with activity. There is worsening of pain with activity and weight bearing, pain that interferes with activities of daily living and pain with passive range of motion.  Patient has evidence of subchondral cysts, joint subluxation and prosthetic loosening by imaging studies. This condition presents safety issues increasing the risk of falls. This patient has had Failure total knee arthroplasty.  There is no current active infection.  There are no active problems to display for this patient.  Past Medical History  Diagnosis Date  . History of knee replacement, total   . Fever   . Breast lump   . Breast discharge   . Breast pain   . Diabetes mellitus   . Thyroid disease   . Asthma   . Hiatal hernia   . Arthritis   . Depression   . Hypertension   . Bipolar 1 disorder   . Hypothyroidism   . Sleep apnea     Past Surgical History  Procedure Date  . Cesarean section   . Breast lumpectomy     right  . Hernia repair   . Appendectomy   . Abdominal hysterectomy     partial  . Knee surgery     right  . Myomectomy abdominal approach 1989  . Myomectomy abdominal approach 1989  . Cardiac catheterization      no significant CAD, nl LV function by 11/08/06 cath    Prescriptions prior to admission  Medication Sig Dispense Refill  . albuterol (PROVENTIL HFA;VENTOLIN HFA) 108 (90 BASE) MCG/ACT inhaler Inhale 2 puffs into the lungs every 6 (six) hours as needed. Shortness of breath      . ARIPiprazole (ABILIFY) 5 MG tablet Take 5 mg by mouth at bedtime.       . Asenapine Maleate (SAPHRIS SL) Place 2 tablets under the tongue at bedtime.       Marland Kitchen buPROPion (WELLBUTRIN XL) 300 MG 24 hr tablet Take 450 mg by mouth daily.       . busPIRone (BUSPAR) 10 MG tablet Take 5-10 mg by mouth 4 (four) times daily.      . diazepam (VALIUM) 10 MG tablet Take 10 mg by mouth 3 (three) times daily.      . diclofenac sodium (VOLTAREN) 1 % GEL Apply 2 g topically 4 (four) times daily. Right knee      . ibuprofen (ADVIL,MOTRIN) 200 MG tablet Take 800 mg by mouth every 6 (six) hours as needed. For pain      . levothyroxine (SYNTHROID, LEVOTHROID) 125 MCG tablet Take 125 mcg by mouth daily.        . Lidocaine 0.5 % GEL Apply 1 application topically 2 (two) times daily. Right knee      . lisinopril-hydrochlorothiazide (PRINZIDE,ZESTORETIC) 20-12.5 MG per tablet  Take 1 tablet by mouth 2 (two) times daily.       . metFORMIN (GLUCOPHAGE) 1000 MG tablet Take 1,000 mg by mouth 2 (two) times daily with a meal.      . niacin 500 MG tablet Take 500 mg by mouth daily with breakfast.      . omeprazole (PRILOSEC) 40 MG capsule Take 40 mg by mouth daily.      Marland Kitchen oxyCODONE (OXYCONTIN) 10 MG 12 hr tablet Take 10 mg by mouth 3 (three) times daily.      . rosuvastatin (CRESTOR) 20 MG tablet Take 20 mg by mouth daily.      . traZODone (DESYREL) 100 MG tablet Take 100 mg by mouth at bedtime.       Allergies  Allergen Reactions  . Aspirin Anaphylaxis  . Sulphur (Sulfur Sublimed) Anaphylaxis    History  Substance Use Topics  . Smoking status: Former Smoker -- 0.5 packs/day for 4 years    Types: Cigarettes    Quit date: 11/13/2010  .  Smokeless tobacco: Never Used  . Alcohol Use: No    Family History  Problem Relation Age of Onset  . Cancer Father     colon  . Stroke Father   . Heart disease Father   . Depression Sister       Review of Systems  All other systems reviewed and are negative.     Objective:  Physical Exam  Vital signs in last 24 hours: Temp:  [97.8 F (36.6 C)-98.1 F (36.7 C)] 98.1 F (36.7 C) (01/08 0621) Pulse Rate:  [69-81] 81  (01/08 0621) Resp:  [20] 20  (01/08 0621) BP: (125-146)/(81-86) 146/86 mmHg (01/08 0621) SpO2:  [97 %-98 %] 98 % (01/08 0621) Weight:  [130.636 kg (288 lb)] 130.636 kg (288 lb) (01/07 0845)  Labs:  Estimated Body mass index is 49.92 kg/(m^2) as calculated from the following:   Height as of 09/06/11: 5\' 5" (1.651 m).   Weight as of 09/06/11: 300 lb(136.079 kg).  Imaging Review Plain radiographs demonstrate severe degenerative joint disease of the right knee(s). The overall alignment is mild varus.There is evidence of loosening of the femoral and tibial components. The bone quality appears to be adequate for age and reported activity level. There is a good chance that revision total knee arthroplasty should restore function..  Assessment/Plan:  End stage arthritis, right knee(s) with failed previous arthroplasty.   The patient history, physical examination, clinical judgment of the provider and imaging studies are consistent with end stage degenerative joint disease of the right knee(s), previous total knee arthroplasty. Revision total knee arthroplasty is deemed medically necessary. The treatment options including medical management, injection therapy, arthroscopy and revision arthroplasty were discussed at length. The risks and benefits of revision total knee arthroplasty were presented and reviewed. The risks due to aseptic loosening, infection, stiffness, patella tracking problems, thromboembolic complications and other imponderables were discussed. The patient  acknowledged the explanation, agreed to proceed with the plan and consent was signed. Patient is being admitted for inpatient treatment for surgery, pain control, PT, OT, prophylactic antibiotics, VTE prophylaxis, progressive ambulation and ADL's and discharge planning.The patient is planning to be discharged home with home health services

## 2012-06-21 NOTE — Anesthesia Preprocedure Evaluation (Addendum)
Anesthesia Evaluation    Reviewed: Allergy & Precautions, H&P , NPO status , Patient's Chart, lab work & pertinent test results  History of Anesthesia Complications Negative for: history of anesthetic complications  Airway Mallampati: III TM Distance: >3 FB Neck ROM: Full    Dental  (+) Teeth Intact and Dental Advisory Given   Pulmonary asthma , sleep apnea ,          Cardiovascular hypertension,     Neuro/Psych PSYCHIATRIC DISORDERS Depression Bipolar Disorder negative neurological ROS     GI/Hepatic Neg liver ROS, hiatal hernia,   Endo/Other  diabetes, Oral Hypoglycemic AgentsHypothyroidism   Renal/GU negative Renal ROS     Musculoskeletal   Abdominal   Peds  Hematology   Anesthesia Other Findings   Reproductive/Obstetrics                          Anesthesia Physical Anesthesia Plan  ASA: III  Anesthesia Plan: General   Post-op Pain Management:    Induction: Intravenous  Airway Management Planned: LMA  Additional Equipment:   Intra-op Plan:   Post-operative Plan: Extubation in OR  Informed Consent:   Plan Discussed with:   Anesthesia Plan Comments:         Anesthesia Quick Evaluation

## 2012-06-21 NOTE — Evaluation (Signed)
Physical Therapy Evaluation Patient Details Name: Caroline Williams MRN: [id number] DOB: December 07, 1960 Today's Date: 06/21/2012 Time: 1732-1800 PT Time Calculation (min): 28 min  PT Assessment / Plan / Recommendation Clinical Impression  Pt is a 52 y/o female s/p R TKA.  Pt limited by pain and dizziness this session.  Acute PT willl follow pt to promote functional mobility for safe d/c to home.      PT Assessment  Patient needs continued PT services    Follow Up Recommendations  Home health PT;Supervision/Assistance - 24 hour    Does the patient have the potential to tolerate intense rehabilitation      Barriers to Discharge        Equipment Recommendations  Rolling walker with 5" wheels (3 in 1, )    Recommendations for Other Services     Frequency 7X/week    Precautions / Restrictions Precautions Precautions: Knee Required Braces or Orthoses: Knee Immobilizer - Right Knee Immobilizer - Right: On when out of bed or walking Restrictions Weight Bearing Restrictions: Yes RLE Weight Bearing: Weight bearing as tolerated   Pertinent Vitals/Pain 3-4/10 pain in knee.  Pt medicated prior to session.         Mobility  Bed Mobility Bed Mobility: Supine to Sit Supine to Sit: 3: Mod assist;HOB flat Details for Bed Mobility Assistance: Assist for R LE, cueing for technique.   Transfers Transfers: Sit to Stand;Stand to [employer] Transfers Sit to Stand: 3: Mod assist;From bed;With upper extremity assist Stand to Sit: 3: Mod assist;To chair/3-in-1;With upper extremity assist Stand Pivot Transfers: 3: Mod assist Details for Transfer Assistance: Verbal cues for technique, Assist to steady pt secondary to R LE weakness and No KI available.  Ambulation/Gait Ambulation/Gait Assistance: Not tested (comment)    Shoulder Instructions     Exercises     PT Diagnosis: Difficulty walking;Generalized weakness;Acute pain  PT Problem List: Decreased strength;Decreased range of  motion;Decreased activity tolerance;Decreased balance;Decreased coordination;Pain;Decreased knowledge of use of DME PT Treatment Interventions: Gait training;DME instruction;Stair training;Functional mobility training;Therapeutic activities;Therapeutic exercise;Patient/family education   PT Goals Acute Rehab PT Goals PT Goal Formulation: With patient Time For Goal Achievement: 06/28/12 Potential to Achieve Goals: Good Pt will go Supine/Side to Sit: with supervision PT Goal: Supine/Side to Sit - Progress: Goal set today Pt will go Sit to Supine/Side: with supervision PT Goal: Sit to Supine/Side - Progress: Goal set today Pt will go Sit to Stand: with supervision PT Goal: Sit to Stand - Progress: Goal set today Pt will go Stand to Sit: with supervision PT Goal: Stand to Sit - Progress: Goal set today Pt will Ambulate: 51 - 150 feet;with supervision;with rolling walker PT Goal: Ambulate - Progress: Goal set today Pt will Perform Home Exercise Program: with supervision, verbal cues required/provided PT Goal: Perform Home Exercise Program - Progress: Goal set today  Visit Information  Last PT Received On: 06/21/12    Subjective Data  Subjective: agree to PT eval. Patient Stated Goal: walk for weight loss.    Prior Functioning  Home Living Lives With: Spouse Available Help at Discharge: Personal care attendant;Family Type of Home: Apartment Home Access: Level entry Home Layout: One level Home Adaptive Equipment: None Prior Function Level of Independence: Independent with assistive device(s) Able to Take Stairs?: Yes Driving: Yes Vocation: On disability Communication Communication: No difficulties Dominant Hand: Right    Cognition  Overall Cognitive Status: Appears within functional limits for tasks assessed/performed Arousal/Alertness: Awake/alert Orientation Level: Appears intact for tasks assessed Behavior During Session:  WFL for tasks performed    Extremity/Trunk  Assessment Right Upper Extremity Assessment RUE ROM/Strength/Tone: Within functional levels Left Upper Extremity Assessment LUE ROM/Strength/Tone: Within functional levels Right Lower Extremity Assessment RLE ROM/Strength/Tone: Unable to fully assess Left Lower Extremity Assessment LLE ROM/Strength/Tone: Within functional levels   Balance Balance Balance Assessed: No  End of Session PT - End of Session Equipment Utilized During Treatment: Gait belt Activity Tolerance: Patient limited by pain;Patient limited by fatigue Patient left: in chair;with call bell/phone within reach;with family/visitor present Nurse Communication: Mobility status;Other (comment) (need for KI)  GP     Caroline Williams 06/21/2012, 7:02 PM  [name] L. Tanyika Barros DPT [phone]

## 2012-06-21 NOTE — Progress Notes (Signed)
Physical Therapy Treatment Patient Details Name: Caroline Williams MRN: [id number] DOB: [birth year] Today's Date: 06/21/2012 Time: 4098-1191 PT Time Calculation (min): 13 min  PT Assessment / Plan / Recommendation Comments on Treatment Session  Pt has KI on.  Pt c/o dizziness in standing at end of session.      Follow Up Recommendations  Home health PT;Supervision/Assistance - 24 hour     Does the patient have the potential to tolerate intense rehabilitation     Barriers to Discharge        Equipment Recommendations  Rolling walker with 5" wheels 3 in1   Recommendations for Other Services  OT  Frequency 7X/week   Plan Discharge plan remains appropriate;Frequency remains appropriate    Precautions / Restrictions Precautions Precautions: Knee Required Braces or Orthoses: Knee Immobilizer - Right Knee Immobilizer - Right: On when out of bed or walking Restrictions Weight Bearing Restrictions: Yes RLE Weight Bearing: Weight bearing as tolerated   Pertinent Vitals/Pain 3/10 pain in knee. No intervention required.     Mobility  Bed Mobility Bed Mobility: Sit to Supine Supine to Sit: 3: Mod assist;HOB flat Sit to Supine: 3: Mod assist Details for Bed Mobility Assistance: Assist for R LE, cueing for technique.   Transfers Transfers: Sit to Stand;Stand to [employer] Transfers Sit to Stand: 3: Mod assist;From bed;With upper extremity assist Stand to Sit: 3: Mod assist;To chair/3-in-1;With upper extremity assist Stand Pivot Transfers: 3: Mod assist Details for Transfer Assistance: Verbal cues for technique, Assist to steady pt secondary to R LE weakness and No KI available.  Ambulation/Gait Ambulation/Gait Assistance: Not tested (comment)    Exercises     PT Diagnosis: Difficulty walking;Generalized weakness;Acute pain  PT Problem List: Decreased strength;Decreased range of motion;Decreased activity tolerance;Decreased balance;Decreased coordination;Pain;Decreased knowledge  of use of DME PT Treatment Interventions: Gait training;DME instruction;Stair training;Functional mobility training;Therapeutic activities;Therapeutic exercise;Patient/family education   PT Goals Acute Rehab PT Goals PT Goal Formulation: With patient Time For Goal Achievement: 06/28/12 Potential to Achieve Goals: Good Pt will go Supine/Side to Sit: with supervision PT Goal: Supine/Side to Sit - Progress: Goal set today Pt will go Sit to Supine/Side: with supervision PT Goal: Sit to Supine/Side - Progress: Goal set today Pt will go Sit to Stand: with supervision PT Goal: Sit to Stand - Progress: Goal set today Pt will go Stand to Sit: with supervision PT Goal: Stand to Sit - Progress: Goal set today Pt will Ambulate: 51 - 150 feet;with supervision;with rolling walker PT Goal: Ambulate - Progress: Goal set today Pt will Perform Home Exercise Program: with supervision, verbal cues required/provided PT Goal: Perform Home Exercise Program - Progress: Goal set today  Visit Information  Last PT Received On: 06/21/12    Subjective Data  Subjective: agree to PT eval. Patient Stated Goal: walk for weight loss.    Cognition  Overall Cognitive Status: Appears within functional limits for tasks assessed/performed Arousal/Alertness: Awake/alert Orientation Level: Appears intact for tasks assessed Behavior During Session: [facility] for tasks performed    Balance  Balance Balance Assessed: No  End of Session PT - End of Session Equipment Utilized During Treatment: Gait belt Activity Tolerance: Patient limited by pain;Patient limited by fatigue Patient left: in bed;with call bell/phone within reach Nurse Communication: Mobility status;Other (comment)   GP     Kaity Pitstick 06/21/2012, 7:14 PM Toula Miyasaki L. Judah Chevere DPT [phone]

## 2012-06-21 NOTE — Op Note (Signed)
OPERATIVE REPORT  DATE OF SURGERY: 06/21/2012  PATIENT:  Caroline Williams,  52 y.o. female  PRE-OPERATIVE DIAGNOSIS:  Failed right Total Knee Arthroplasty  POST-OPERATIVE DIAGNOSIS:  Failed right Total Knee Arthroplasty  PROCEDURE:  Procedure(s): TOTAL KNEE REVISION Zimmer components Size 3 femur with stem and medial 5 mm augment. Size 3 tibia with 10 mm augment and 20 mm poly-tray.  SURGEON:  Surgeon(s): Nadara Mustard, MD  ANESTHESIA:   regional and general  EBL:  Minimal ML  SPECIMEN:  No Specimen  TOURNIQUET:  * No tourniquets in log *  PROCEDURE DETAILS: Patient is a 52 year old woman with a BMI of proximally 52 has had subsidence of her tibial tray from her total knee arthroplasty the right knee. Do to collapse of the components instability pain with activities daily living failure conservative treatment patient presents at this time for revision surgery. Risks and benefits were discussed including infection neurovascular injury rupture the patella tendon failure of the components DVT pulmonary embolus need for additional surgery. Patient states she understands and wished to proceed at this time. Description of procedure patient was brought to the operating room and underwent a general anesthetic. After adequate levels of anesthesia were obtained patient's right lower extremity was prepped using DuraPrep draped into a sterile field an [place] was used to cover all exposed skin. A midline incision was made carried down to a medial parapatellar retinacular incision. A synovectomy is performed. Examination of patella the patella was stable and intact and this was left in place. Using a oscillating saw a saw was used to loosen the cement mantle from the femoral and tibial components. Both components were removed the cement mantle was removed and the tibial and femoral canals was sequentially reamed up to 11 mm for the tibia 13 mm for the femur. A. IM alignment guide was used to resect the  proximal tibia. The bone was debrided of the femur and this had a good fit with the stemmed size 3 femur. A augment was placed on the medial aspect of the femoral component. Trial components were then performed and the knee had good stability with 10 mm augmentation the tibia plus a 20 mm polyethylene liner. The wound was irrigated with normal saline throughout the case. The tibial and femoral components were cemented in place the knee was left in extension until the cement hardened. The screw was used to lock the tibial tray. Patient has stable varus and valgus had full extension and flexion to 120. The patella tracked midline. Loose cement was removed. After the cement hardened the retinaculum was closed using #1 Vicryl subcutaneous is closed using 0 Vicryl the skin was closed using staples. The wound was covered with Adaptic orthopedic sponges ABDs dressing Kerlix and Coban. Patient was extubated taken to the PACU in stable condition.  PLAN OF CARE: Admit to inpatient   PATIENT DISPOSITION:  PACU - hemodynamically stable.   Nadara Mustard, MD 06/21/2012 11:01 AM

## 2012-06-22 LAB — GLUCOSE, CAPILLARY
Glucose-Capillary: 155 mg/dL — ABNORMAL HIGH (ref 70–99)
Glucose-Capillary: 110 mg/dL — ABNORMAL HIGH (ref 70–99)
Glucose-Capillary: 125 mg/dL — ABNORMAL HIGH (ref 70–99)
Glucose-Capillary: 108 mg/dL — ABNORMAL HIGH (ref 70–99)

## 2012-06-22 LAB — BASIC METABOLIC PANEL
BUN: 12 mg/dL (ref 6–23)
CO2: 27 mEq/L (ref 19–32)
Calcium: 9 mg/dL (ref 8.4–10.5)
Chloride: 96 mEq/L (ref 96–112)
Creatinine, Ser: 0.76 mg/dL (ref 0.50–1.10)
GFR calc Af Amer: >90 mL/min (ref >90)
GFR calc non Af Amer: >90 mL/min (ref >90)
Glucose, Bld: 164 mg/dL — ABNORMAL HIGH (ref 70–99)
Potassium: 3.3 mEq/L — ABNORMAL LOW (ref 3.5–5.1)
Sodium: 137 mEq/L (ref 135–145)

## 2012-06-22 LAB — CBC
HCT: 32 % — ABNORMAL LOW (ref 36.0–46.0)
Hemoglobin: 10.4 g/dL — ABNORMAL LOW (ref 12.0–15.0)
MCH: 26 pg (ref 26.0–34.0)
MCHC: 32.5 g/dL (ref 30.0–36.0)
MCV: 80 fL (ref 78.0–100.0)
Platelets: 246 10*3/uL (ref 150–400)
RBC: 4 MIL/uL (ref 3.87–5.11)
RDW: 16.1 % — ABNORMAL HIGH (ref 11.5–15.5)
WBC: 7.4 10*3/uL (ref 4.0–10.5)

## 2012-06-22 LAB — PROTIME-INR
INR: 1.09 (ref 0.00–1.49)
Prothrombin Time: 14 seconds (ref 11.6–15.2)

## 2012-06-22 MED ORDER — NICOTINE 21 MG/24HR TD PT24
21.0000 mg | MEDICATED_PATCH | Freq: Every day | TRANSDERMAL | Status: DC
  Administered 2012-06-22 – 2012-06-23 (×2): 21 mg via TRANSDERMAL
  Filled 2012-06-22 (×2): qty 1

## 2012-06-22 MED ORDER — WARFARIN SODIUM 7.5 MG PO TABS
7.5000 mg | ORAL_TABLET | Freq: Once | ORAL | Status: AC
  Administered 2012-06-22: 7.5 mg via ORAL
  Filled 2012-06-22: qty 1

## 2012-06-22 NOTE — Progress Notes (Signed)
Orthopedic Tech Progress Note Patient Details:  Caroline Williams November 14, 1960 [id number] Knee Immobilizer has already been applied.  Patient ID: Caroline Williams, female   DOB: 05-Jul-1960, 52 y.o.   MRN: [id number]   Caroline Williams 06/22/2012, 2:26 PM

## 2012-06-22 NOTE — Progress Notes (Signed)
Patient ID: Caroline Williams, female   DOB: 1960-10-28, 52 y.o.   MRN: [id number] Addendum to operative note:  Patient's BMI is 52.

## 2012-06-22 NOTE — Progress Notes (Signed)
Physical Therapy Treatment Patient Details Name: Caroline Williams MRN: [id number] DOB: 1961-01-30 Today's Date: 06/22/2012 Time: 1536-1600 PT Time Calculation (min): 24 min  PT Assessment / Plan / Recommendation Comments on Treatment Session  Pt mobility improving.      Follow Up Recommendations  Home health PT;Supervision/Assistance - 24 hour     Does the patient have the potential to tolerate intense rehabilitation     Barriers to Discharge        Equipment Recommendations  Rolling walker with 5" wheels    Recommendations for Other Services    Frequency 7X/week   Plan Discharge plan remains appropriate;Frequency remains appropriate    Precautions / Restrictions Precautions Precautions: Knee Required Braces or Orthoses: Knee Immobilizer - Right Knee Immobilizer - Right: On when out of bed or walking Restrictions Weight Bearing Restrictions: Yes RLE Weight Bearing: Weight bearing as tolerated   Pertinent Vitals/Pain 8/10 pain in knee.  Pt medicated prior to session.      Mobility  Bed Mobility Bed Mobility: Not assessed Transfers Transfers: Sit to Stand;Stand to Sit;Stand Pivot Transfers Sit to Stand: 5: Supervision;From chair/3-in-1;With upper extremity assist Stand to Sit: 5: Supervision;To chair/3-in-1;With upper extremity assist Details for Transfer Assistance: supervision for safety.  Ambulation/Gait Ambulation/Gait Assistance: 5: Supervision Ambulation Distance (Feet): 100 Feet Assistive device: Rolling walker Ambulation/Gait Assistance Details: Cues to increase gait speed and maintain safe distance from walker as pt places walker too far away.  Gait Pattern: Step-to pattern Gait velocity: excessively slow.  56ft/64seconds.    Stairs: No    Exercises     PT Diagnosis:    PT Problem List:   PT Treatment Interventions:     PT Goals Acute Rehab PT Goals PT Goal Formulation: With patient Time For Goal Achievement: 06/28/12 Potential to Achieve Goals:  Good Pt will go Supine/Side to Sit: with supervision Pt will go Sit to Stand: with supervision PT Goal: Sit to Stand - Progress: Progressing toward goal Pt will go Stand to Sit: with supervision PT Goal: Stand to Sit - Progress: Met Pt will Ambulate: 51 - 150 feet;with supervision;with rolling walker PT Goal: Ambulate - Progress: Partly met Pt will Perform Home Exercise Program: with supervision, verbal cues required/provided PT Goal: Perform Home Exercise Program - Progress: Progressing toward goal  Visit Information  Last PT Received On: 06/22/12    Subjective Data  Subjective: no new c/o  Patient Stated Goal: walk for weight loss.    Cognition  Overall Cognitive Status: Appears within functional limits for tasks assessed/performed Arousal/Alertness: Awake/alert Orientation Level: Appears intact for tasks assessed Behavior During Session: [facility] for tasks performed    Balance     End of Session PT - End of Session Equipment Utilized During Treatment: Gait belt Activity Tolerance: Patient tolerated treatment well Patient left: in chair;with call bell/phone within reach Nurse Communication: Mobility status;Other (comment)   GP     Caroline Williams 06/22/2012, 4:50 PM Caroline Williams DPT [phone]

## 2012-06-22 NOTE — Progress Notes (Signed)
Physical Therapy Treatment Patient Details Name: Caroline Williams MRN: [id number] DOB: [birth year] Today's Date: 06/22/2012 Time: 0830-0900 PT Time Calculation (min): 30 min  PT Assessment / Plan / Recommendation Comments on Treatment Session  Pt continues to have bleeding from the knee. All dressings saturated upon PT arrival.  Notified RN Pt's gait is slow and labor intensive.  Pt requesting peanut butter and graham crackers. Reminded pt of her diabetic dietary restictions.   Pt may benefit from a nutitionist consult.      Follow Up Recommendations  Home health PT;Supervision/Assistance - 24 hour     Does the patient have the potential to tolerate intense rehabilitation     Barriers to Discharge        Equipment Recommendations  Rolling walker with 5" wheels    Recommendations for Other Services    Frequency 7X/week   Plan Discharge plan remains appropriate;Frequency remains appropriate    Precautions / Restrictions Precautions Precautions: Knee Restrictions Weight Bearing Restrictions: Yes RLE Weight Bearing: Weight bearing as tolerated   Pertinent Vitals/Pain 7/10 pain in knee.  RN gave pt IV Dilaudid during session.       Mobility  Bed Mobility Bed Mobility: Supine to Sit Supine to Sit: 4: Min guard Transfers Transfers: Sit to Stand;Stand to [employer] Transfers Sit to Stand: 4: Min guard;From bed Stand to Sit: 4: Min guard;To chair/3-in-1 Ambulation/Gait Ambulation/Gait Assistance: 4: Min assist Ambulation Distance (Feet): 50 Feet Assistive device: Rolling walker Ambulation/Gait Assistance Details: cues for gait sequencing and proper distancing from walkeer.  Gait Pattern: Step-to pattern Stairs: No    Exercises     PT Diagnosis:    PT Problem List:   PT Treatment Interventions:     PT Goals Acute Rehab PT Goals PT Goal Formulation: With patient Time For Goal Achievement: 06/28/12 Potential to Achieve Goals: Good Pt will go Supine/Side to Sit: with  supervision PT Goal: Supine/Side to Sit - Progress: Progressing toward goal Pt will go Sit to Supine/Side: with supervision PT Goal: Sit to Supine/Side - Progress: Progressing toward goal Pt will go Sit to Stand: with supervision PT Goal: Sit to Stand - Progress: Progressing toward goal Pt will go Stand to Sit: with supervision PT Goal: Stand to Sit - Progress: Progressing toward goal Pt will Ambulate: 51 - 150 feet;with supervision;with rolling walker PT Goal: Ambulate - Progress: Progressing toward goal Pt will Perform Home Exercise Program: with supervision, verbal cues required/provided PT Goal: Perform Home Exercise Program - Progress: Progressing toward goal  Visit Information  Last PT Received On: 06/22/12 Assistance Needed: +1    Subjective Data  Subjective: no new c/o    Cognition  Overall Cognitive Status: Appears within functional limits for tasks assessed/performed Arousal/Alertness: Awake/alert Orientation Level: Appears intact for tasks assessed Behavior During Session: [facility] for tasks performed    Balance  Balance Balance Assessed: No  End of Session PT - End of Session Equipment Utilized During Treatment: Gait belt Activity Tolerance: Patient tolerated treatment well Patient left: in chair;with call bell/phone within reach Nurse Communication: Mobility status;Other (comment)   GP     Sierra Bissonette 06/22/2012, 10:43 AM Theron Arista L. Benaiah Behan DPT [phone]

## 2012-06-22 NOTE — Progress Notes (Signed)
CARE MANAGEMENT NOTE 06/22/2012  Patient:  Caroline Williams, Caroline Williams   Account Number:  [id number]  Date Initiated:  06/22/2012  Documentation initiated by:  Vance Peper  Subjective/Objective Assessment:   52 yr old female s/p revision of right total knee arthroplasty     Action/Plan:   CM spoke with patient concerning home health and DME needs at discharge. Choice offered.   Anticipated DC Date:  06/24/2012   Anticipated DC Plan:  HOME W HOME HEALTH SERVICES      DC Planning Services  CM consult      [facility] Choice  HOME HEALTH  DURABLE MEDICAL EQUIPMENT   Choice offered to / List presented to:  C-1 Patient   DME arranged  [facility]  3-N-1      DME agency  [facility]     HH arranged  HH-2 PT      [facility] agency  [facility]   Status of service:  Completed, signed off Medicare Important Message given?   (If response is "NO", the following Medicare IM given date fields will be blank) Date Medicare IM given:   Date Additional Medicare IM given:    Discharge Disposition:  HOME W HOME HEALTH SERVICES  Per UR Regulation:    If discussed at Long Length of Stay Meetings, dates discussed:    Comments:

## 2012-06-22 NOTE — Progress Notes (Signed)
Patient ID: Caroline Williams, female   DOB: 08/27/1960, 52 y.o.   MRN: [id number] Postoperative day 1 right total knee arthroplasty revision. Patient is comfortable this morning. Plan for physical therapy progressive ambulation weightbearing as tolerated.  Patient is to use the knee immobilizer for ambulation minimize her risk of knee instability.  Plan for discharge to home once safe with ambulation.  Patient requests a nicotine patch.

## 2012-06-22 NOTE — Progress Notes (Signed)
ANTICOAGULATION CONSULT NOTE - Follow Up Consult  Pharmacy Consult for Coumadin Indication: VTE prophylaxis  Allergies  Allergen Reactions  . Aspirin Anaphylaxis  . Sulfa Antibiotics Anaphylaxis    Patient Measurements: Height: 5\' 5"  (165.1 cm) Weight: 288 lb (130.636 kg) IBW/kg (Calculated) : 57   Vital Signs: Temp: 97.9 F (36.6 C) (01/09 0717) BP: 115/64 mmHg (01/09 0717) Pulse Rate: 102  (01/09 0717)  Labs:  Basename 06/22/12 0610 06/20/12 0946  HGB 10.4* 12.9  HCT 32.0* 39.3  PLT 246 240  APTT -- 33  LABPROT 14.0 12.2  INR 1.09 0.91  HEPARINUNFRC -- --  CREATININE 0.76 0.91  CKTOTAL -- --  CKMB -- --  TROPONINI -- --    Estimated Creatinine Clearance: 112.2 ml/min (by C-G formula based on Cr of 0.76).   Medications:  Scheduled:    . ARIPiprazole  5 mg Oral QHS  . asenapine  5 mg Sublingual BID  . atorvastatin  10 mg Oral q1800  . buPROPion  450 mg Oral Daily  . busPIRone  5-10 mg Oral QID  . [COMPLETED]  ceFAZolin (ANCEF) IV  2 g Intravenous Q6H  . [COMPLETED] coumadin book   Does not apply Once  . diazepam  10 mg Oral TID  . lisinopril  20 mg Oral Daily   And  . hydrochlorothiazide  12.5 mg Oral Daily  . [EXPIRED] HYDROmorphone      . insulin aspart  0-15 Units Subcutaneous TID WC  . levothyroxine  125 mcg Oral QAC breakfast  . metFORMIN  1,000 mg Oral BID WC  . niacin  500 mg Oral Q breakfast  . nicotine  21 mg Transdermal Daily  . OxyCODONE  10 mg Oral TID  . pantoprazole  40 mg Oral Daily  . traZODone  100 mg Oral QHS  . [COMPLETED] warfarin  7.5 mg Oral ONCE-1800  . warfarin   Does not apply Once  . Warfarin - Pharmacist Dosing Inpatient   Does not apply q1800  . [DISCONTINUED] lisinopril-hydrochlorothiazide  1 tablet Oral BID  . [DISCONTINUED] oxyCODONE  10 mg Oral TID    Assessment: 52 yo F admitted 06/21/2012 for revision of R TKA on Coumadin for post-op VTE prophylaxis.  INR trending up as expected.  Goal of Therapy:  INR 2-3     Plan:  Repeat Coumadin 7.5 mg PO x 1 tonight. Continue Daily INR.  [employer], Pharm.D., BCPS Clinical Pharmacist Pager [phone] 06/22/2012 11:45 AM

## 2012-06-22 NOTE — Clinical Documentation Improvement (Signed)
BMI DOCUMENTATION CLARIFICATION QUERY  THIS DOCUMENT IS NOT A PERMANENT PART OF THE MEDICAL RECORD  TO RESPOND TO THE THIS QUERY, FOLLOW THE INSTRUCTIONS BELOW:  1. If needed, update documentation for the patient's encounter via the notes activity.  2. Access this query again and click edit on the In [employer].  3. After updating, or not, click F2 to complete all highlighted (required) fields concerning your review. Select "additional documentation in the medical record" OR "no additional documentation provided".  4. Click Sign note button.  5. The deficiency will fall out of your In Basket *Please let us know if you are not able to complete this workflow by phone or e-mail (listed below).         06/22/12  Dear Dr. Jonathon Bellows Marton Redwood  In an effort to better capture your patient's severity of illness, reflect appropriate length of stay and utilization of resources, a review of the patient medical record has revealed the following indicators.    Based on your clinical judgment, please clarify and document in a progress note and/or discharge summary the clinical condition associated with the following supporting information:  In responding to this query please exercise your independent judgment.  The fact that a query is asked, does not imply that any particular answer is desired or expected.  Documented in operative note BMI 52, in order to capture the co morbid condition a diagnosis must also be documented .   Please document the appropriate diagnosis if known.  Thank you   Possible Clinical conditions  Morbid Obesity   Obesity  Other condition  Cannot Clinically determine    Risk Factors: s/p knee revision, htn, diabetes, sleep apnea   Weight:  288 lbs Height : 5\' 5"  BMI = 48     Reviewed: additional documentation in the medical record  Thank You,  Leonette Most Addison  Clinical Documentation Specialist, BSN: Pager [phone]  Health Information  Management [place]

## 2012-06-23 ENCOUNTER — Encounter (HOSPITAL_COMMUNITY): Payer: Self-pay | Admitting: Orthopedic Surgery

## 2012-06-23 LAB — GLUCOSE, CAPILLARY
Glucose-Capillary: 108 mg/dL — ABNORMAL HIGH (ref 70–99)
Glucose-Capillary: 98 mg/dL (ref 70–99)

## 2012-06-23 LAB — BASIC METABOLIC PANEL
BUN: 9 mg/dL (ref 6–23)
CO2: 30 mEq/L (ref 19–32)
Calcium: 9.3 mg/dL (ref 8.4–10.5)
Chloride: 96 mEq/L (ref 96–112)
Creatinine, Ser: 0.82 mg/dL (ref 0.50–1.10)
GFR calc Af Amer: >90 mL/min (ref >90)
GFR calc non Af Amer: 81 mL/min — ABNORMAL LOW (ref >90)
Glucose, Bld: 114 mg/dL — ABNORMAL HIGH (ref 70–99)
Potassium: 3.1 mEq/L — ABNORMAL LOW (ref 3.5–5.1)
Sodium: 136 mEq/L (ref 135–145)

## 2012-06-23 LAB — CBC
HCT: 30.2 % — ABNORMAL LOW (ref 36.0–46.0)
Hemoglobin: 9.6 g/dL — ABNORMAL LOW (ref 12.0–15.0)
MCH: 25.7 pg — ABNORMAL LOW (ref 26.0–34.0)
MCHC: 31.8 g/dL (ref 30.0–36.0)
MCV: 80.7 fL (ref 78.0–100.0)
Platelets: 225 10*3/uL (ref 150–400)
RBC: 3.74 MIL/uL — ABNORMAL LOW (ref 3.87–5.11)
RDW: 16.4 % — ABNORMAL HIGH (ref 11.5–15.5)
WBC: 8.2 10*3/uL (ref 4.0–10.5)

## 2012-06-23 LAB — PROTIME-INR
INR: 1.16 (ref 0.00–1.49)
Prothrombin Time: 14.6 seconds (ref 11.6–15.2)

## 2012-06-23 MED ORDER — WARFARIN SODIUM 1 MG PO TABS: 1.0000 mg | ORAL_TABLET | Freq: Every day | ORAL | Status: DC

## 2012-06-23 MED ORDER — OXYCODONE-ACETAMINOPHEN 5-325 MG PO TABS: 1.0000 | ORAL_TABLET | ORAL | Status: DC | PRN

## 2012-06-23 NOTE — Progress Notes (Signed)
ANTICOAGULATION CONSULT NOTE - Follow Up Consult  Pharmacy Consult for coumadin Indication: VTE prophylaxis  Allergies  Allergen Reactions  . Aspirin Anaphylaxis  . Sulfa Antibiotics Anaphylaxis    Patient Measurements: Height: 5\' 5"  (165.1 cm) Weight: 288 lb (130.636 kg) IBW/kg (Calculated) : 57  Heparin Dosing Weight:   Vital Signs: Temp: 97.9 F (36.6 C) (01/10 0629) BP: 145/76 mmHg (01/10 0629) Pulse Rate: 82  (01/10 0629)  Labs:  Basename 06/23/12 0625 06/22/12 0610 06/20/12 0946  HGB 9.6* 10.4* --  HCT 30.2* 32.0* 39.3  PLT 225 246 240  APTT -- -- 33  LABPROT 14.6 14.0 12.2  INR 1.16 1.09 0.91  HEPARINUNFRC -- -- --  CREATININE 0.82 0.76 0.91  CKTOTAL -- -- --  CKMB -- -- --  TROPONINI -- -- --    Estimated Creatinine Clearance: 109.5 ml/min (by C-G formula based on Cr of 0.82).   Medications:  Scheduled:    . ARIPiprazole  5 mg Oral QHS  . asenapine  5 mg Sublingual BID  . atorvastatin  10 mg Oral q1800  . buPROPion  450 mg Oral Daily  . busPIRone  5-10 mg Oral QID  . diazepam  10 mg Oral TID  . lisinopril  20 mg Oral Daily   And  . hydrochlorothiazide  12.5 mg Oral Daily  . insulin aspart  0-15 Units Subcutaneous TID WC  . levothyroxine  125 mcg Oral QAC breakfast  . metFORMIN  1,000 mg Oral BID WC  . niacin  500 mg Oral Q breakfast  . nicotine  21 mg Transdermal Daily  . OxyCODONE  10 mg Oral TID  . pantoprazole  40 mg Oral Daily  . traZODone  100 mg Oral QHS  . [COMPLETED] warfarin  7.5 mg Oral ONCE-1800  . warfarin   Does not apply Once  . Warfarin - Pharmacist Dosing Inpatient   Does not apply q1800   Infusions:    . sodium chloride 20 mL/hr at 06/22/12 1900    Assessment: 52 yo female s/p R TKA is currently on subtherapeutic coumadin. Plan to be discharged today. Goal of Therapy:      Plan:  1) Noted MD's decision to discharge patient on coumadin 1mg  po qday. Will sign off  Zeanna Sunde, Tsz-Yin 06/23/2012,8:52 AM

## 2012-06-23 NOTE — Progress Notes (Signed)
Physical Therapy Treatment Patient Details Name: Caroline Williams MRN: [id number] DOB: 1961/02/14 Today's Date: 06/23/2012 Time: 5784-6962 PT Time Calculation (min): 30 min  PT Assessment / Plan / Recommendation Comments on Treatment Session  Pt mobility improving.      Follow Up Recommendations  Home health PT;Supervision/Assistance - 24 hour     Does the patient have the potential to tolerate intense rehabilitation     Barriers to Discharge        Equipment Recommendations  Rolling walker with 5" wheels    Recommendations for Other Services    Frequency 7X/week   Plan Discharge plan remains appropriate;Frequency remains appropriate    Precautions / Restrictions Precautions Precautions: Knee Required Braces or Orthoses: Knee Immobilizer - Right Knee Immobilizer - Right: On when out of bed or walking Restrictions Weight Bearing Restrictions: Yes RLE Weight Bearing: Weight bearing as tolerated   Pertinent Vitals/Pain 5/10 pain in knee. Pt medicated prior to session.      Mobility  Bed Mobility Bed Mobility: Supine to Sit Supine to Sit: 6: Modified independent (Device/Increase time) Transfers Transfers: Sit to Stand;Stand to Sit Sit to Stand: 5: Supervision Stand to Sit: 5: Supervision Stand Pivot Transfers: 5: Supervision Ambulation/Gait Ambulation/Gait Assistance: 5: Supervision Ambulation Distance (Feet): 100 Feet Assistive device: Rolling walker Ambulation/Gait Assistance Details: Cues to increase gait speed and maintain safe distance from walker as pt places walker too far away Gait Pattern: Step-to pattern Stairs: No    Exercises     PT Diagnosis:    PT Problem List:   PT Treatment Interventions:     PT Goals Acute Rehab PT Goals PT Goal Formulation: With patient Time For Goal Achievement: 06/28/12 Potential to Achieve Goals: Good Pt will go Supine/Side to Sit: with supervision PT Goal: Supine/Side to Sit - Progress: Met Pt will go Sit to Supine/Side:  with supervision PT Goal: Sit to Supine/Side - Progress: Met Pt will go Sit to Stand: with supervision PT Goal: Sit to Stand - Progress: Met Pt will go Stand to Sit: with supervision PT Goal: Stand to Sit - Progress: Met Pt will Ambulate: 51 - 150 feet;with supervision;with rolling walker PT Goal: Ambulate - Progress: Met Pt will Perform Home Exercise Program: with supervision, verbal cues required/provided  Visit Information  Last PT Received On: 06/23/12    Subjective Data  Subjective: Pain is pretty rough today.   Patient Stated Goal: walk for weight loss.    Cognition  Overall Cognitive Status: Appears within functional limits for tasks assessed/performed Arousal/Alertness: Awake/alert Orientation Level: Appears intact for tasks assessed Behavior During Session: [facility] for tasks performed    Balance  Balance Balance Assessed: No  End of Session PT - End of Session Equipment Utilized During Treatment: Gait belt Activity Tolerance: Patient tolerated treatment well Patient left: in chair;with call bell/phone within reach Nurse Communication: Mobility status;Other (comment)   GP     Caroline Williams 06/23/2012, 3:21 PM Caroline Williams L. Amayra Kiedrowski DPT [phone]

## 2012-06-23 NOTE — Progress Notes (Signed)
PHYSICAL THERAPY PROGRESS NOTE.    06/23/12 1500  PT Visit Information  Last PT Received On 06/23/12  PT Time Calculation  PT Start Time 1530  PT Stop Time 1546  PT Time Calculation (min) 16 min  Subjective Data  Subjective I am tired  Patient Stated Goal walk for weight loss.   Precautions  Precautions Knee  Required Braces or Orthoses Knee Immobilizer - Right  Knee Immobilizer - Right On when out of bed or walking  Restrictions  Weight Bearing Restrictions Yes  RLE Weight Bearing WBAT  Cognition  Overall Cognitive Status Appears within functional limits for tasks assessed/performed  Arousal/Alertness Awake/alert  Orientation Level Appears intact for tasks assessed  Behavior During Session [facility] for tasks performed  Bed Mobility  Bed Mobility Not assessed  Transfers  Transfers Not assessed  Ambulation/Gait  Ambulation/Gait Assistance Not tested (comment)  Total Joint Exercises  Ankle Circles/Pumps 10 reps;Both  Quad Sets Right;10 reps  Short Arc Quad 10 reps;Right  Heel Slides 10 reps;Right  PT - End of Session  Activity Tolerance Patient tolerated treatment well  Patient left in bed;with call bell/phone within reach  Nurse Communication Mobility status;Other (comment)  PT - Assessment/Plan  PT Plan Discharge plan remains appropriate;Frequency remains appropriate  PT Frequency 7X/week  Follow Up Recommendations Home health PT;Supervision/Assistance - 24 hour  PT equipment Rolling walker with 5" wheels  Acute Rehab PT Goals  PT Goal Formulation With patient  Time For Goal Achievement 06/28/12  Potential to Achieve Goals Good  Pt will go Supine/Side to Sit with supervision  Pt will go Sit to Supine/Side with supervision  Pt will go Sit to Stand with supervision  Pt will Perform Home Exercise Program with supervision, verbal cues required/provided  PT Goal: Perform Home Exercise Program - Progress Met  PT General Charges  $$ ACUTE PT VISIT 1 Procedure  PT Treatments    $Therapeutic Exercise 8-22 mins   Paysen Goza L. Veronia Laprise DPT [phone]

## 2012-06-23 NOTE — Discharge Summary (Signed)
Physician Discharge Summary  Patient ID: Santos Hardwick MRN: [id number] DOB/AGE: Jan 13, 1961 52 y.o.  Admit date: 06/21/2012 Discharge date: 06/23/2012  Admission Diagnoses: Failed right knee total knee arthroplasty.  Discharge Diagnoses: Same Active Problems:  * No active hospital problems. *    Discharged Condition: stable  Hospital Course: Patient's hospital course was essentially unremarkable. She underwent revision right total knee arthroplasty. Postoperatively patient progressed well with therapy and was discharged to home in stable condition with home health physical therapy and home health aid.  Consults: None  Significant Diagnostic Studies: labs: Routine labs  Treatments: surgery: See operative note  Discharge Exam: Blood pressure 145/76, pulse 82, temperature 97.9 F (36.6 C), temperature source Oral, resp. rate 20, height 5\' 5"  (1.651 m), weight 130.636 kg (288 lb), SpO2 99.00%. Incision/Wound: dressing clean dry and intact at time of discharge  Disposition: 01-Home or Self Care  Discharge Orders    Future Orders Please Complete By Expires   Diet - low sodium heart healthy      Call MD / Call 911      Comments:   If you experience chest pain or shortness of breath, CALL 911 and be transported to the hospital emergency room.  If you develope a fever above 101 F, pus (white drainage) or increased drainage or redness at the wound, or calf pain, call your surgeon's office.   Constipation Prevention      Comments:   Drink plenty of fluids.  Prune juice may be helpful.  You may use a stool softener, such as Colace (over the counter) 100 mg twice a day.  Use MiraLax (over the counter) for constipation as needed.   Increase activity slowly as tolerated          Medication List     As of 06/23/2012  7:05 AM    TAKE these medications         albuterol 108 (90 BASE) MCG/ACT inhaler   Commonly known as: PROVENTIL HFA;VENTOLIN HFA   Inhale 2 puffs into the lungs every 6  (six) hours as needed. Shortness of breath      ARIPiprazole 5 MG tablet   Commonly known as: ABILIFY   Take 5 mg by mouth at bedtime.      buPROPion 300 MG 24 hr tablet   Commonly known as: WELLBUTRIN XL   Take 450 mg by mouth daily.      busPIRone 10 MG tablet   Commonly known as: BUSPAR   Take 5-10 mg by mouth 4 (four) times daily.      diazepam 10 MG tablet   Commonly known as: VALIUM   Take 10 mg by mouth 3 (three) times daily.      diclofenac sodium 1 % Gel   Commonly known as: VOLTAREN   Apply 2 g topically 4 (four) times daily. Right knee      ibuprofen 200 MG tablet   Commonly known as: ADVIL,MOTRIN   Take 800 mg by mouth every 6 (six) hours as needed. For pain      levothyroxine 125 MCG tablet   Commonly known as: SYNTHROID, LEVOTHROID   Take 125 mcg by mouth daily.      Lidocaine 0.5 % Gel   Apply 1 application topically 2 (two) times daily. Right knee      lisinopril-hydrochlorothiazide 20-12.5 MG per tablet   Commonly known as: PRINZIDE,ZESTORETIC   Take 1 tablet by mouth 2 (two) times daily.      metFORMIN 1000 MG tablet  Commonly known as: GLUCOPHAGE   Take 1,000 mg by mouth 2 (two) times daily with a meal.      niacin 500 MG tablet   Take 500 mg by mouth daily with breakfast.      omeprazole 40 MG capsule   Commonly known as: PRILOSEC   Take 40 mg by mouth daily.      oxyCODONE 10 MG 12 hr tablet   Commonly known as: OXYCONTIN   Take 10 mg by mouth 3 (three) times daily.      oxyCODONE-acetaminophen 5-325 MG per tablet   Commonly known as: PERCOCET/ROXICET   Take 1 tablet by mouth every 4 (four) hours as needed for pain.      rosuvastatin 20 MG tablet   Commonly known as: CRESTOR   Take 20 mg by mouth daily.      SAPHRIS SL   Place 2 tablets under the tongue at bedtime.      traZODone 100 MG tablet   Commonly known as: DESYREL   Take 100 mg by mouth at bedtime.      warfarin 1 MG tablet   Commonly known as: COUMADIN   Take 1 tablet  (1 mg total) by mouth daily.           Follow-up Information    Follow up with DUDA,MARCUS V, MD. In 1 week.   Contact information:   [address] [place] [address] [phone]          Signed: Nadara Mustard 06/23/2012, 7:05 AM

## 2012-06-30 DIAGNOSIS — E669 Obesity, unspecified: Secondary | ICD-10-CM | POA: Insufficient documentation

## 2012-08-09 ENCOUNTER — Encounter (HOSPITAL_COMMUNITY): Payer: Self-pay | Admitting: Cardiology

## 2012-08-09 ENCOUNTER — Emergency Department (HOSPITAL_COMMUNITY)
Admission: EM | Admit: 2012-08-09 | Discharge: 2012-08-09 | Disposition: A | Discharge status: Home / Self Care | Payer: PRIVATE HEALTH INSURANCE | Attending: Emergency Medicine | Admitting: Emergency Medicine

## 2012-08-09 DIAGNOSIS — J45909 Unspecified asthma, uncomplicated: Secondary | ICD-10-CM | POA: Insufficient documentation

## 2012-08-09 DIAGNOSIS — Y929 Unspecified place or not applicable: Secondary | ICD-10-CM | POA: Insufficient documentation

## 2012-08-09 DIAGNOSIS — Z87898 Personal history of other specified conditions: Secondary | ICD-10-CM | POA: Insufficient documentation

## 2012-08-09 DIAGNOSIS — Z87891 Personal history of nicotine dependence: Secondary | ICD-10-CM | POA: Insufficient documentation

## 2012-08-09 DIAGNOSIS — Z8739 Personal history of other diseases of the musculoskeletal system and connective tissue: Secondary | ICD-10-CM | POA: Insufficient documentation

## 2012-08-09 DIAGNOSIS — R209 Unspecified disturbances of skin sensation: Secondary | ICD-10-CM | POA: Insufficient documentation

## 2012-08-09 DIAGNOSIS — Z8719 Personal history of other diseases of the digestive system: Secondary | ICD-10-CM | POA: Insufficient documentation

## 2012-08-09 DIAGNOSIS — F319 Bipolar disorder, unspecified: Secondary | ICD-10-CM | POA: Insufficient documentation

## 2012-08-09 DIAGNOSIS — E079 Disorder of thyroid, unspecified: Secondary | ICD-10-CM | POA: Insufficient documentation

## 2012-08-09 DIAGNOSIS — E119 Type 2 diabetes mellitus without complications: Secondary | ICD-10-CM | POA: Insufficient documentation

## 2012-08-09 DIAGNOSIS — Z872 Personal history of diseases of the skin and subcutaneous tissue: Secondary | ICD-10-CM | POA: Insufficient documentation

## 2012-08-09 DIAGNOSIS — I10 Essential (primary) hypertension: Secondary | ICD-10-CM | POA: Insufficient documentation

## 2012-08-09 DIAGNOSIS — Y9389 Activity, other specified: Secondary | ICD-10-CM | POA: Insufficient documentation

## 2012-08-09 DIAGNOSIS — E039 Hypothyroidism, unspecified: Secondary | ICD-10-CM | POA: Insufficient documentation

## 2012-08-09 DIAGNOSIS — R202 Paresthesia of skin: Secondary | ICD-10-CM

## 2012-08-09 DIAGNOSIS — Y831 Surgical operation with implant of artificial internal device as the cause of abnormal reaction of the patient, or of later complication, without mention of misadventure at the time of the procedure: Secondary | ICD-10-CM | POA: Insufficient documentation

## 2012-08-09 DIAGNOSIS — G4730 Sleep apnea, unspecified: Secondary | ICD-10-CM | POA: Insufficient documentation

## 2012-08-09 DIAGNOSIS — Z79899 Other long term (current) drug therapy: Secondary | ICD-10-CM | POA: Insufficient documentation

## 2012-08-09 DIAGNOSIS — Z96659 Presence of unspecified artificial knee joint: Secondary | ICD-10-CM | POA: Insufficient documentation

## 2012-08-09 DIAGNOSIS — IMO0002 Reserved for concepts with insufficient information to code with codable children: Secondary | ICD-10-CM | POA: Insufficient documentation

## 2012-08-09 DIAGNOSIS — W19XXXA Unspecified fall, initial encounter: Secondary | ICD-10-CM

## 2012-08-09 DIAGNOSIS — R296 Repeated falls: Secondary | ICD-10-CM | POA: Insufficient documentation

## 2012-08-09 MED ORDER — ONDANSETRON 4 MG PO TBDP
8.0000 mg | ORAL_TABLET | Freq: Once | ORAL | Status: AC
  Administered 2012-08-09: 8 mg via ORAL
  Filled 2012-08-09: qty 2

## 2012-08-09 NOTE — ED Notes (Signed)
Pt reports she had recent knee surgery on the right knee. States she was getting out of bed and slipped. Denies any injury. States she has had decreased sensation on that side and tingling. Cap refill good on that side. CBG-90 Bp-148/98 Hr-94 RR-16

## 2012-08-09 NOTE — ED Provider Notes (Signed)
History     CSN: [id number]  Arrival date & time 08/09/12  1012   First MD Initiated Contact with Patient 08/09/12 1022      Chief Complaint  Patient presents with  . Fall    (Consider location/radiation/quality/duration/timing/severity/associated sxs/prior treatment) HPI Caroline Williams is a 52 y.o. female who presents to ED with complaint of a fall. Pt states she had a right knee replacement a month ago. Since then has had persistent constant numbness in right foot. States numbness is ankle down, both over plantar and dorsal foot. No pain. States it has been persistent and now noticed some weakness in the foot. States has followed up with her surgeon who started her on neurontin. States yesterday called on call doctor who told her to come see them in the office today. States today, however, when was getting ready, fell down due to right foot weakness. States no injuries during the fall. States just had EMS bring her here.    Past Medical History  Diagnosis Date  . History of knee replacement, total   . Fever   . Breast lump   . Breast discharge   . Breast pain   . Diabetes mellitus   . Thyroid disease   . Asthma   . Hiatal hernia   . Arthritis   . Depression   . Hypertension   . Bipolar 1 disorder   . Hypothyroidism   . Sleep apnea     Past Surgical History  Procedure Laterality Date  . Cesarean section    . Breast lumpectomy      right  . Hernia repair    . Appendectomy    . Abdominal hysterectomy      partial  . Knee surgery      right  . Myomectomy abdominal approach  1989  . Myomectomy abdominal approach  1989  . Cardiac catheterization      no significant CAD, nl LV function by 11/08/06 cath  . Total knee revision  06/21/2012    Procedure: TOTAL KNEE REVISION;  Surgeon: Nadara Mustard, MD;  Location: [facility] OR;  Service: Orthopedics;  Laterality: Right;  Revision Right Total Knee Arthroplasty    Family History  Problem Relation Age of Onset  . Cancer Father     colon  . Stroke Father   . Heart disease Father   . Depression Sister     History  Substance Use Topics  . Smoking status: Former Smoker -- 0.50 packs/day for 4 years    Types: Cigarettes    Quit date: 11/13/2010  . Smokeless tobacco: Never Used  . Alcohol Use: No    OB History   Grav Para Term Preterm Abortions TAB SAB Ect Mult Living                  Review of Systems  Constitutional: Negative for fever and chills.  HENT: Negative for neck pain and neck stiffness.   Respiratory: Negative.   Cardiovascular: Negative.   Musculoskeletal: Positive for joint swelling and arthralgias.  Neurological: Positive for weakness and numbness.  All other systems reviewed and are negative.    Allergies  Aspirin and Sulfa antibiotics  Home Medications   Current Outpatient Rx  Name  Route  Sig  Dispense  Refill  . albuterol (PROVENTIL HFA;VENTOLIN HFA) 108 (90 BASE) MCG/ACT inhaler   Inhalation   Inhale 2 puffs into the lungs every 6 (six) hours as needed. Shortness of breath         .  ARIPiprazole (ABILIFY) 5 MG tablet   Oral   Take 5 mg by mouth at bedtime.          . Asenapine Maleate (SAPHRIS SL)   Sublingual   Place 2 tablets under the tongue at bedtime.          Marland Kitchen atenolol (TENORMIN) 25 MG tablet   Oral   Take 12.5 mg by mouth daily.         Marland Kitchen buPROPion (WELLBUTRIN XL) 300 MG 24 hr tablet   Oral   Take 450 mg by mouth daily.          . busPIRone (BUSPAR) 10 MG tablet   Oral   Take 5-10 mg by mouth 4 (four) times daily.         . diazepam (VALIUM) 10 MG tablet   Oral   Take 10 mg by mouth 3 (three) times daily.         . diclofenac sodium (VOLTAREN) 1 % GEL   Topical   Apply 2 g topically 4 (four) times daily. Right knee         . gabapentin (NEURONTIN) 100 MG capsule   Oral   Take 100 mg by mouth 3 (three) times daily.         Marland Kitchen ibuprofen (ADVIL,MOTRIN) 200 MG tablet   Oral   Take 800 mg by mouth every 6 (six) hours as needed. For  pain         . levocetirizine (XYZAL) 5 MG tablet   Oral   Take 5 mg by mouth every evening.         Marland Kitchen levothyroxine (SYNTHROID, LEVOTHROID) 125 MCG tablet   Oral   Take 125 mcg by mouth daily.           . Lidocaine 0.5 % GEL   Apply externally   Apply 1 application topically 2 (two) times daily. Right knee         . lisinopril-hydrochlorothiazide (PRINZIDE,ZESTORETIC) 20-12.5 MG per tablet   Oral   Take 1 tablet by mouth 2 (two) times daily.          . metFORMIN (GLUCOPHAGE) 1000 MG tablet   Oral   Take 1,000 mg by mouth 2 (two) times daily with a meal.         . omeprazole (PRILOSEC) 40 MG capsule   Oral   Take 40 mg by mouth daily.         . Oxycodone HCl 10 MG TABS   Oral   Take 10 mg by mouth every 4 (four) hours as needed (for pain).         . rosuvastatin (CRESTOR) 20 MG tablet   Oral   Take 20 mg by mouth daily.         . Tapentadol HCl (NUCYNTA ER) 200 MG TB12   Oral   Take 200 mg by mouth every 12 (twelve) hours.         . traZODone (DESYREL) 100 MG tablet   Oral   Take 100 mg by mouth at bedtime.           BP 142/84  Pulse 70  Temp(Src) 97.7 F (36.5 C) (Oral)  Resp 18  SpO2 100%  Physical Exam  Nursing note and vitals reviewed. Constitutional: She is oriented to person, place, and time. She appears well-developed and well-nourished. No distress.  Eyes: Conjunctivae are normal.  Neck: Neck supple.  Cardiovascular: Normal rate, regular rhythm and normal heart sounds.  Pulmonary/Chest: Effort normal and breath sounds normal. No respiratory distress. She has no wheezes. She has no rales.  Musculoskeletal:  Right knee with vertical healed incision. Joint appears normal. Stable. Full ROM. No swelling or edema. Dorsal pedal pulses normal and equal bilaterally. Good cap refill in all toes.   Neurological: She is alert and oriented to person, place, and time.  Decreased sensation from proximal dorsal medial foot through entire  dorsal foot and 1 through 3rd metatarsals and phalanges, and over both planter heel and entire foot surfaces. Pt has weakness with right foot dorsiflexion, however, 5/5 strength with holding it in the dorsiflexed position against resistance. 5/5 strength with plantar flexion.     Skin: Skin is warm and dry.  Psychiatric: She has a normal mood and affect.    ED Course  Procedures (including critical care time)  Labs Reviewed - No data to display No results found.   1. Paresthesia of right foot   2. Fall, initial encounter       MDM  Pt post knee replacement last month, pain improved. Here with weakness to right foot that has began after the replacement. No injury during the fall. Pt has no signs of cauda equina. Wonder if numbness is from DDD vs knee replacement. Numbness is not dermatomal. Some weakness present. Pt appears to be in no distress. Dr. Rubin Payor spoke with Dr. Lajoyce Corners, will follow up closely in the office. Return if worsening.           Lottie Mussel, PA 08/09/12 1537  Lottie Mussel, [place] 08/09/12 1538

## 2012-08-09 NOTE — ED Provider Notes (Signed)
Medical screening examination/treatment/procedure(s) were performed by non-physician practitioner and as supervising physician I was immediately available for consultation/collaboration.  Juliet Rude. Rubin Payor, MD 08/09/12 [phone]

## 2012-11-21 ENCOUNTER — Other Ambulatory Visit: Payer: Self-pay | Admitting: Family Medicine

## 2012-11-21 DIAGNOSIS — E1010 Type 1 diabetes mellitus with ketoacidosis without coma: Secondary | ICD-10-CM

## 2012-11-21 DIAGNOSIS — S81809A Unspecified open wound, unspecified lower leg, initial encounter: Secondary | ICD-10-CM

## 2012-12-08 ENCOUNTER — Other Ambulatory Visit: Payer: PRIVATE HEALTH INSURANCE

## 2012-12-26 ENCOUNTER — Other Ambulatory Visit: Payer: Self-pay | Admitting: Family Medicine

## 2012-12-26 DIAGNOSIS — Z1231 Encounter for screening mammogram for malignant neoplasm of breast: Secondary | ICD-10-CM

## 2013-01-17 ENCOUNTER — Emergency Department (HOSPITAL_COMMUNITY): Payer: Medicare HMO

## 2013-01-17 ENCOUNTER — Emergency Department (HOSPITAL_COMMUNITY)
Admission: EM | Admit: 2013-01-17 | Discharge: 2013-01-17 | Disposition: A | Discharge status: Home / Self Care | Payer: Medicare HMO | Attending: Emergency Medicine | Admitting: Emergency Medicine

## 2013-01-17 ENCOUNTER — Encounter (HOSPITAL_COMMUNITY): Payer: Self-pay | Admitting: Emergency Medicine

## 2013-01-17 DIAGNOSIS — Z8719 Personal history of other diseases of the digestive system: Secondary | ICD-10-CM | POA: Insufficient documentation

## 2013-01-17 DIAGNOSIS — Z79899 Other long term (current) drug therapy: Secondary | ICD-10-CM | POA: Insufficient documentation

## 2013-01-17 DIAGNOSIS — I10 Essential (primary) hypertension: Secondary | ICD-10-CM | POA: Insufficient documentation

## 2013-01-17 DIAGNOSIS — J45901 Unspecified asthma with (acute) exacerbation: Secondary | ICD-10-CM | POA: Insufficient documentation

## 2013-01-17 DIAGNOSIS — R509 Fever, unspecified: Secondary | ICD-10-CM | POA: Insufficient documentation

## 2013-01-17 DIAGNOSIS — R0789 Other chest pain: Secondary | ICD-10-CM | POA: Insufficient documentation

## 2013-01-17 DIAGNOSIS — Z8739 Personal history of other diseases of the musculoskeletal system and connective tissue: Secondary | ICD-10-CM | POA: Insufficient documentation

## 2013-01-17 DIAGNOSIS — E039 Hypothyroidism, unspecified: Secondary | ICD-10-CM | POA: Insufficient documentation

## 2013-01-17 DIAGNOSIS — G4730 Sleep apnea, unspecified: Secondary | ICD-10-CM | POA: Insufficient documentation

## 2013-01-17 DIAGNOSIS — M25469 Effusion, unspecified knee: Secondary | ICD-10-CM | POA: Insufficient documentation

## 2013-01-17 DIAGNOSIS — F319 Bipolar disorder, unspecified: Secondary | ICD-10-CM | POA: Insufficient documentation

## 2013-01-17 DIAGNOSIS — Z8742 Personal history of other diseases of the female genital tract: Secondary | ICD-10-CM | POA: Insufficient documentation

## 2013-01-17 DIAGNOSIS — Z9861 Coronary angioplasty status: Secondary | ICD-10-CM | POA: Insufficient documentation

## 2013-01-17 DIAGNOSIS — J069 Acute upper respiratory infection, unspecified: Secondary | ICD-10-CM | POA: Insufficient documentation

## 2013-01-17 DIAGNOSIS — R05 Cough: Secondary | ICD-10-CM | POA: Insufficient documentation

## 2013-01-17 DIAGNOSIS — E119 Type 2 diabetes mellitus without complications: Secondary | ICD-10-CM | POA: Insufficient documentation

## 2013-01-17 DIAGNOSIS — M25569 Pain in unspecified knee: Secondary | ICD-10-CM | POA: Insufficient documentation

## 2013-01-17 DIAGNOSIS — E079 Disorder of thyroid, unspecified: Secondary | ICD-10-CM | POA: Insufficient documentation

## 2013-01-17 DIAGNOSIS — Z9889 Other specified postprocedural states: Secondary | ICD-10-CM | POA: Insufficient documentation

## 2013-01-17 DIAGNOSIS — G8929 Other chronic pain: Secondary | ICD-10-CM | POA: Insufficient documentation

## 2013-01-17 DIAGNOSIS — Z87891 Personal history of nicotine dependence: Secondary | ICD-10-CM | POA: Insufficient documentation

## 2013-01-17 DIAGNOSIS — R059 Cough, unspecified: Secondary | ICD-10-CM | POA: Insufficient documentation

## 2013-01-17 LAB — RAPID STREP SCREEN (MED CTR MEBANE ONLY): Streptococcus, Group A Screen (Direct): NEGATIVE

## 2013-01-17 MED ORDER — GUAIFENESIN 100 MG/5ML PO LIQD: 100.0000 mg | ORAL | Status: DC | PRN

## 2013-01-17 MED ORDER — CYCLOBENZAPRINE HCL 10 MG PO TABS
5.0000 mg | ORAL_TABLET | Freq: Once | ORAL | Status: AC
  Administered 2013-01-17: 5 mg via ORAL
  Filled 2013-01-17: qty 1

## 2013-01-17 NOTE — ED Notes (Signed)
PT ambulated with baseline gait; VSS; A&Ox3; no signs of distress; respirations even and unlabored; skin warm and dry; no questions upon discharge.  

## 2013-01-17 NOTE — ED Notes (Signed)
Pt c/o sore throat and right knee pain; pt sts fatigue

## 2013-01-17 NOTE — ED Provider Notes (Signed)
  Medical screening examination/treatment/procedure(s) were performed by non-physician practitioner and as supervising physician I was immediately available for consultation/collaboration.    Gerhard Munch, MD 01/17/13 [phone]

## 2013-01-17 NOTE — ED Provider Notes (Signed)
CSN: [id number]     Arrival date & time 01/17/13  4098 History     First MD Initiated Contact with Patient 01/17/13 1018     Chief Complaint  Patient presents with  . Sore Throat  . Knee Pain   (Consider location/radiation/quality/duration/timing/severity/associated sxs/prior Treatment) HPI Comments: Patient reports she has had one day of fever, sore throat, cough, mild SOB, chest soreness with coughing.  Has had recent sick contacts.  Also c/o right knee pain.  H/o knee replacement by Dr Lajoyce Corners January of this year.  Reports this is a typical flare up.  No injury.  Ongiong for several days since excessive activity, helping people move, on her feet 12 hours/day.  Has not contacted Dr Lajoyce Corners.  NO weakness or numbness of the leg.   Patient is a 52 y.o. female presenting with pharyngitis and knee pain. The history is provided by the patient.  Sore Throat Associated symptoms include chills, coughing, a fever, joint swelling and a sore throat. Pertinent negatives include no numbness or weakness.  Knee Pain Associated symptoms: fever     Past Medical History  Diagnosis Date  . History of knee replacement, total   . Fever   . Breast lump   . Breast discharge   . Breast pain   . Diabetes mellitus   . Thyroid disease   . Asthma   . Hiatal hernia   . Arthritis   . Depression   . Hypertension   . Bipolar 1 disorder   . Hypothyroidism   . Sleep apnea    Past Surgical History  Procedure Laterality Date  . Cesarean section    . Breast lumpectomy      right  . Hernia repair    . Appendectomy    . Abdominal hysterectomy      partial  . Knee surgery      right  . Myomectomy abdominal approach  1989  . Myomectomy abdominal approach  1989  . Cardiac catheterization      no significant CAD, nl LV function by 11/08/06 cath  . Total knee revision  06/21/2012    Procedure: TOTAL KNEE REVISION;  Surgeon: Nadara Mustard, MD;  Location: [facility] OR;  Service: Orthopedics;  Laterality: Right;   Revision Right Total Knee Arthroplasty   Family History  Problem Relation Age of Onset  . Cancer Father     colon  . Stroke Father   . Heart disease Father   . Depression Sister    History  Substance Use Topics  . Smoking status: Former Smoker -- 0.50 packs/day for 4 years    Types: Cigarettes    Quit date: 11/13/2010  . Smokeless tobacco: Never Used  . Alcohol Use: No   OB History   Grav Para Term Preterm Abortions TAB SAB Ect Mult Living                 Review of Systems  Constitutional: Positive for fever and chills.  HENT: Positive for sore throat. Negative for trouble swallowing, neck stiffness and voice change.   Respiratory: Positive for cough, chest tightness and shortness of breath.   Musculoskeletal: Positive for joint swelling. Negative for gait problem.  Neurological: Negative for weakness and numbness.  Psychiatric/Behavioral: Negative for confusion.    Allergies  Aspirin and Sulfa antibiotics  Home Medications   Current Outpatient Rx  Name  Route  Sig  Dispense  Refill  . albuterol (PROVENTIL HFA;VENTOLIN HFA) 108 (90 BASE) MCG/ACT inhaler  Inhalation   Inhale 2 puffs into the lungs every 6 (six) hours as needed for wheezing or shortness of breath. Shortness of breath         . ARIPiprazole (ABILIFY) 5 MG tablet   Oral   Take 5 mg by mouth at bedtime.          . Asenapine Maleate (SAPHRIS SL)   Sublingual   Place 2 tablets under the tongue at bedtime.          Marland Kitchen atenolol (TENORMIN) 25 MG tablet   Oral   Take 12.5 mg by mouth daily.         . diazepam (VALIUM) 10 MG tablet   Oral   Take 10 mg by mouth 3 (three) times daily.         . diclofenac sodium (VOLTAREN) 1 % GEL   Topical   Apply 2 g topically 3 (three) times daily. Right knee         . fluticasone (FLONASE) 50 MCG/ACT nasal spray   Nasal   Place 2 sprays into the nose daily as needed (congestion).         . gabapentin (NEURONTIN) 100 MG capsule   Oral   Take 100  mg by mouth 2 (two) times daily.          Marland Kitchen ibuprofen (ADVIL,MOTRIN) 200 MG tablet   Oral   Take 400 mg by mouth every 6 (six) hours as needed for pain. For pain         . levothyroxine (SYNTHROID, LEVOTHROID) 125 MCG tablet   Oral   Take 125 mcg by mouth daily.          . Lidocaine 0.5 % GEL   Apply externally   Apply 1 application topically 2 (two) times daily. Right knee         . metFORMIN (GLUCOPHAGE) 1000 MG tablet   Oral   Take 1,000 mg by mouth 2 (two) times daily with a meal.         . omeprazole (PRILOSEC) 40 MG capsule   Oral   Take 40 mg by mouth daily as needed (acid reflex).          . Oxycodone HCl 10 MG TABS   Oral   Take 10 mg by mouth every 4 (four) hours as needed (for pain).         . rosuvastatin (CRESTOR) 20 MG tablet   Oral   Take 20 mg by mouth daily.         . Tapentadol HCl (NUCYNTA ER) 200 MG TB12   Oral   Take 200 mg by mouth every 12 (twelve) hours as needed (pain).          . traZODone (DESYREL) 100 MG tablet   Oral   Take 100 mg by mouth at bedtime.          BP 167/109  Pulse 103  Temp(Src) 98.1 F (36.7 C) (Oral)  Resp 18  SpO2 97% Physical Exam  Nursing note and vitals reviewed. Constitutional: She appears well-developed and well-nourished. No distress.  HENT:  Head: Normocephalic and atraumatic.  Mouth/Throat: Uvula is midline. No edematous. Posterior oropharyngeal erythema present. No oropharyngeal exudate or tonsillar abscesses.  Eyes: Conjunctivae are normal.  Neck: Neck supple.  Cardiovascular: Normal rate and regular rhythm.   Pulmonary/Chest: Effort normal and breath sounds normal. No stridor. No respiratory distress. She has no wheezes. She has no rales.  Musculoskeletal:  Legs: Right knee without erythema.  Small area of tenderness, pt states is chronic.  Area is warm from hot pack patient has ACE bandaged in place.  Area away from heat pack is not warm.  Distal sensation and pulses intact.     Lymphadenopathy:    She has cervical adenopathy.  Neurological: She is alert.  Skin: She is not diaphoretic.    ED Course   Procedures (including critical care time)  Labs Reviewed  RAPID STREP SCREEN  CULTURE, GROUP A STREP   Dg Chest 2 View  01/17/2013   *RADIOLOGY REPORT*  Clinical Data: Cough and congestion; sore throat  CHEST - 2 VIEW  Comparison: June 20, 2012  Findings: There is mild bibasilar atelectatic change.  Lungs are otherwise clear.  Heart size and pulmonary vascularity normal.  No adenopathy.  No bone lesions.  There are staples either in or overlying the lower neck region in the midline.  IMPRESSION: Staples either in or overlying the lower cervical region in the midline.  No edema or consolidation.  Mild bibasilar subsegmental atelectasis.   Original Report Authenticated By: Bretta Bang, M.D.   1. URI (upper respiratory infection)   2. Chronic knee pain, right     MDM  Pt with chronic right knee pain, unchanged, no new injury.  No need for radiographs at this time.  Pt requested flexeril for pain.    One day of sore throat and cough with hx sick contacts.  Pt with hx strep and pneumonia, worried about those things.  Strep screen and CXR negative.  Likely viral illness.  D/C home with symptomatic treatment.  Discussed all results with patient.  Pt given return precautions.  Pt verbalizes understanding and agrees with plan.     I doubt any other EMC precluding discharge at this time including, but not necessarily limited to the following: septic knee  Trixie Dredge, PA-C 01/17/13 1552

## 2013-01-19 LAB — CULTURE, GROUP A STREP

## 2013-02-16 ENCOUNTER — Emergency Department (HOSPITAL_COMMUNITY)
Admission: EM | Admit: 2013-02-16 | Discharge: 2013-02-16 | Disposition: A | Discharge status: Home / Self Care | Payer: Medicare HMO | Attending: Emergency Medicine | Admitting: Emergency Medicine

## 2013-02-16 ENCOUNTER — Emergency Department (HOSPITAL_COMMUNITY): Payer: Medicare HMO

## 2013-02-16 ENCOUNTER — Encounter (HOSPITAL_COMMUNITY): Payer: Self-pay | Admitting: Emergency Medicine

## 2013-02-16 DIAGNOSIS — Z79899 Other long term (current) drug therapy: Secondary | ICD-10-CM | POA: Insufficient documentation

## 2013-02-16 DIAGNOSIS — Z87891 Personal history of nicotine dependence: Secondary | ICD-10-CM | POA: Insufficient documentation

## 2013-02-16 DIAGNOSIS — M25561 Pain in right knee: Secondary | ICD-10-CM

## 2013-02-16 DIAGNOSIS — Z9889 Other specified postprocedural states: Secondary | ICD-10-CM | POA: Insufficient documentation

## 2013-02-16 DIAGNOSIS — S8990XA Unspecified injury of unspecified lower leg, initial encounter: Secondary | ICD-10-CM | POA: Insufficient documentation

## 2013-02-16 DIAGNOSIS — J45909 Unspecified asthma, uncomplicated: Secondary | ICD-10-CM | POA: Insufficient documentation

## 2013-02-16 DIAGNOSIS — E119 Type 2 diabetes mellitus without complications: Secondary | ICD-10-CM | POA: Insufficient documentation

## 2013-02-16 DIAGNOSIS — Z791 Long term (current) use of non-steroidal anti-inflammatories (NSAID): Secondary | ICD-10-CM | POA: Insufficient documentation

## 2013-02-16 DIAGNOSIS — I10 Essential (primary) hypertension: Secondary | ICD-10-CM | POA: Insufficient documentation

## 2013-02-16 DIAGNOSIS — Y9289 Other specified places as the place of occurrence of the external cause: Secondary | ICD-10-CM | POA: Insufficient documentation

## 2013-02-16 DIAGNOSIS — G8929 Other chronic pain: Secondary | ICD-10-CM | POA: Insufficient documentation

## 2013-02-16 DIAGNOSIS — W19XXXA Unspecified fall, initial encounter: Secondary | ICD-10-CM | POA: Insufficient documentation

## 2013-02-16 DIAGNOSIS — Z8742 Personal history of other diseases of the female genital tract: Secondary | ICD-10-CM | POA: Insufficient documentation

## 2013-02-16 DIAGNOSIS — E039 Hypothyroidism, unspecified: Secondary | ICD-10-CM | POA: Insufficient documentation

## 2013-02-16 DIAGNOSIS — Z8719 Personal history of other diseases of the digestive system: Secondary | ICD-10-CM | POA: Insufficient documentation

## 2013-02-16 DIAGNOSIS — M129 Arthropathy, unspecified: Secondary | ICD-10-CM | POA: Insufficient documentation

## 2013-02-16 DIAGNOSIS — Z96659 Presence of unspecified artificial knee joint: Secondary | ICD-10-CM | POA: Insufficient documentation

## 2013-02-16 DIAGNOSIS — F319 Bipolar disorder, unspecified: Secondary | ICD-10-CM | POA: Insufficient documentation

## 2013-02-16 DIAGNOSIS — Y9301 Activity, walking, marching and hiking: Secondary | ICD-10-CM | POA: Insufficient documentation

## 2013-02-16 LAB — GLUCOSE, CAPILLARY: Glucose-Capillary: 88 mg/dL (ref 70–99)

## 2013-02-16 MED ORDER — HYDROMORPHONE HCL PF 1 MG/ML IJ SOLN: 1.0000 mg | Freq: Once | INTRAMUSCULAR | Status: DC

## 2013-02-16 MED ORDER — KETOROLAC TROMETHAMINE 60 MG/2ML IM SOLN
60.0000 mg | Freq: Once | INTRAMUSCULAR | Status: AC
  Administered 2013-02-16: 60 mg via INTRAMUSCULAR
  Filled 2013-02-16: qty 2

## 2013-02-16 NOTE — ED Notes (Signed)
CBG 88 

## 2013-02-16 NOTE — ED Provider Notes (Signed)
CSN: [id number]     Arrival date & time 02/16/13  4098 History   First MD Initiated Contact with Patient 02/16/13 [phone]     Chief Complaint  Patient presents with  . Leg Pain   (Consider location/radiation/quality/duration/timing/severity/associated sxs/prior Treatment) HPI Comments: Patient is a 52 year old female past medical history significant for total right knee replacement with total revision, DM, thyroid disease, depression, HTN, Bipolar 1 disease, osteoarthritis presenting to the emergency department for acute on chronic right knee pain. Patient states she is having increased trouble with her right knee giving out over the last few months since knee replacement at the beginning of the year. Patient states yesterday she was walking and her knee buckled and gave out. Patient denies hitting her head or LOC. She has complained of increased throbbing and intermittent sharp pain to R knee w/o radiation since then. Patient states she has noticed some minimal swelling that has been alleviated by icing it and home pain medications. Patient has not followed up with Dr. Lajoyce Corners since then. Patient denies any new or changing numbness in her right leg since surgery. She states this feels like a typical knee flareup. Patient is requesting Toradol for her pain.   Past Medical History  Diagnosis Date  . History of knee replacement, total   . Fever   . Breast lump   . Breast discharge   . Breast pain   . Diabetes mellitus   . Thyroid disease   . Asthma   . Hiatal hernia   . Arthritis   . Depression   . Hypertension   . Bipolar 1 disorder   . Hypothyroidism   . Sleep apnea    Past Surgical History  Procedure Laterality Date  . Cesarean section    . Breast lumpectomy      right  . Hernia repair    . Appendectomy    . Abdominal hysterectomy      partial  . Knee surgery      right  . Myomectomy abdominal approach  1989  . Myomectomy abdominal approach  1989  . Cardiac catheterization     no significant CAD, nl LV function by 11/08/06 cath  . Total knee revision  06/21/2012    Procedure: TOTAL KNEE REVISION;  Surgeon: Nadara Mustard, MD;  Location: [facility] OR;  Service: Orthopedics;  Laterality: Right;  Revision Right Total Knee Arthroplasty   Family History  Problem Relation Age of Onset  . Cancer Father     colon  . Stroke Father   . Heart disease Father   . Depression Sister    History  Substance Use Topics  . Smoking status: Former Smoker -- 0.50 packs/day for 4 years    Types: Cigarettes    Quit date: 11/13/2010  . Smokeless tobacco: Never Used  . Alcohol Use: No   OB History   Grav Para Term Preterm Abortions TAB SAB Ect Mult Living                 Review of Systems  Constitutional: Negative for fever and chills.  Musculoskeletal: Positive for myalgias, joint swelling and arthralgias.  Neurological: Negative for syncope and headaches.  All other systems reviewed and are negative.    Allergies  Aspirin and Sulfa antibiotics  Home Medications   Current Outpatient Rx  Name  Route  Sig  Dispense  Refill  . albuterol (PROVENTIL HFA;VENTOLIN HFA) 108 (90 BASE) MCG/ACT inhaler   Inhalation   Inhale 2 puffs into the  lungs every 6 (six) hours as needed for wheezing or shortness of breath. Shortness of breath         . ARIPiprazole (ABILIFY) 5 MG tablet   Oral   Take 5 mg by mouth at bedtime.          . Asenapine Maleate (SAPHRIS SL)   Sublingual   Place 2 tablets under the tongue at bedtime.          Marland Kitchen atenolol (TENORMIN) 25 MG tablet   Oral   Take 12.5 mg by mouth daily.         . diazepam (VALIUM) 10 MG tablet   Oral   Take 10 mg by mouth 3 (three) times daily.         . diclofenac sodium (VOLTAREN) 1 % GEL   Topical   Apply 2 g topically 3 (three) times daily. Right knee         . fluticasone (FLONASE) 50 MCG/ACT nasal spray   Nasal   Place 2 sprays into the nose daily as needed (congestion).         . gabapentin (NEURONTIN) 100  MG capsule   Oral   Take 100 mg by mouth 2 (two) times daily.          Marland Kitchen guaiFENesin (ROBITUSSIN) 100 MG/5ML liquid   Oral   Take 5-10 mLs (100-200 mg total) by mouth every 4 (four) hours as needed for cough.   60 mL   0   . ibuprofen (ADVIL,MOTRIN) 200 MG tablet   Oral   Take 400 mg by mouth every 6 (six) hours as needed for pain. For pain         . levothyroxine (SYNTHROID, LEVOTHROID) 125 MCG tablet   Oral   Take 125 mcg by mouth daily.          . Lidocaine 0.5 % GEL   Apply externally   Apply 1 application topically 2 (two) times daily. Right knee         . metFORMIN (GLUCOPHAGE) 1000 MG tablet   Oral   Take 1,000 mg by mouth 2 (two) times daily with a meal.         . omeprazole (PRILOSEC) 40 MG capsule   Oral   Take 40 mg by mouth daily as needed (acid reflex).          . Oxycodone HCl 10 MG TABS   Oral   Take 10 mg by mouth every 4 (four) hours as needed (for pain).         . rosuvastatin (CRESTOR) 20 MG tablet   Oral   Take 20 mg by mouth daily.         . Tapentadol HCl (NUCYNTA ER) 200 MG TB12   Oral   Take 200 mg by mouth every 12 (twelve) hours as needed (pain).          . traZODone (DESYREL) 100 MG tablet   Oral   Take 100 mg by mouth at bedtime.          BP 124/74  Pulse 73  Temp(Src) 97.9 F (36.6 C) (Oral)  Resp 16  SpO2 99% Physical Exam  Constitutional: She is oriented to person, place, and time. She appears well-developed and well-nourished. No distress.  HENT:  Head: Normocephalic and atraumatic.  Right Ear: External ear normal.  Left Ear: External ear normal.  Nose: Nose normal.  Eyes: Conjunctivae are normal.  Neck: Neck supple.  Cardiovascular: Normal rate, regular  rhythm, normal heart sounds and intact distal pulses.   Pulmonary/Chest: Effort normal and breath sounds normal.  Abdominal: Soft.  Musculoskeletal:       Right knee: She exhibits decreased range of motion. She exhibits no swelling, no effusion, no  ecchymosis, no deformity, no laceration and no erythema. Tenderness found.  Neurological: She is alert and oriented to person, place, and time. No sensory deficit.  Able to ambulate in ED.   Skin: Skin is warm and dry. She is not diaphoretic.    ED Course  Procedures (including critical care time)  Medications  ketorolac (TORADOL) injection 60 mg (60 mg Intramuscular Given 02/16/13 0801)    Labs Review Labs Reviewed  GLUCOSE, CAPILLARY   Imaging Review Dg Knee Complete 4 Views Right  02/16/2013   *RADIOLOGY REPORT*  Clinical Data: Fall, knee pain  RIGHT KNEE - COMPLETE 4+ VIEW  Comparison: Prior radiograph from 06/21/2012  Findings: Skin staples are no longer visualized.  The right knee prosthesis is stable in position and alignment.  The hardware appears intact.  No periprosthetic lucency to suggest loosening or infection identified.  There is no fracture.  No joint effusion. No soft tissue abnormality identified.  IMPRESSION: Right total knee arthroplasty in normal anatomic alignment without evidence of hardware complication.  No acute fracture identified about the knee.   Original Report Authenticated By: Rise Mu, M.D.    MDM   1. Right knee pain    Afebrile, NAD, non-toxic appearing, AAOx4. Right knee TTP w/o erythema, warmth, drainage, swelling, obvious deformity, decreased sensation, laceration. Some decreased ROM d/t pain. Pt able to ambulate in ED w/o difficulty. X-ray results were personally reviewed by myself. Pain managed in ED. No concern for acute emergent cause of knee pain including but not limited to septic joint based on history, physical exam, and previous ED visits for similar complaints. Pt again advised to f/u with Dr. Lajoyce Corners regarding continued knee difficulties. Advised to take at home chronic pain medications, ice, rest, elevate, and use knee sleeve provided in ED. Patient is agreeable to plan. Patient d/w with Dr. Elesa Massed, agrees with plan. Patient is stable at  time of discharge        Jeannetta Ellis, PA-C 02/16/13 1445

## 2013-02-16 NOTE — ED Notes (Signed)
EMS called out to house Pt. Was out of town and fell yesterday injuring her right knee causing minor swelling to her knee. She was planning on getting it evaluated to today when she fell again on it and call EMS and had them trans port her to hospital.

## 2013-02-19 NOTE — ED Provider Notes (Signed)
Medical screening examination/treatment/procedure(s) were performed by non-physician practitioner and as supervising physician I was immediately available for consultation/collaboration.  Juan Kissoon N Katrisha Segall, DO 02/19/13 2019 

## 2013-03-26 ENCOUNTER — Emergency Department (HOSPITAL_COMMUNITY): Payer: Medicare HMO

## 2013-03-26 ENCOUNTER — Emergency Department (HOSPITAL_COMMUNITY)
Admission: EM | Admit: 2013-03-26 | Discharge: 2013-03-26 | Disposition: A | Discharge status: Home / Self Care | Payer: Medicare HMO | Attending: Emergency Medicine | Admitting: Emergency Medicine

## 2013-03-26 ENCOUNTER — Encounter: Payer: Self-pay | Admitting: Physical Medicine & Rehabilitation

## 2013-03-26 ENCOUNTER — Encounter (HOSPITAL_COMMUNITY): Payer: Self-pay | Admitting: Emergency Medicine

## 2013-03-26 DIAGNOSIS — S0990XA Unspecified injury of head, initial encounter: Secondary | ICD-10-CM

## 2013-03-26 DIAGNOSIS — S8990XA Unspecified injury of unspecified lower leg, initial encounter: Secondary | ICD-10-CM | POA: Insufficient documentation

## 2013-03-26 DIAGNOSIS — S39012A Strain of muscle, fascia and tendon of lower back, initial encounter: Secondary | ICD-10-CM

## 2013-03-26 DIAGNOSIS — Z87891 Personal history of nicotine dependence: Secondary | ICD-10-CM | POA: Insufficient documentation

## 2013-03-26 DIAGNOSIS — J45909 Unspecified asthma, uncomplicated: Secondary | ICD-10-CM | POA: Insufficient documentation

## 2013-03-26 DIAGNOSIS — S0993XA Unspecified injury of face, initial encounter: Secondary | ICD-10-CM | POA: Insufficient documentation

## 2013-03-26 DIAGNOSIS — F3130 Bipolar disorder, current episode depressed, mild or moderate severity, unspecified: Secondary | ICD-10-CM | POA: Insufficient documentation

## 2013-03-26 DIAGNOSIS — Z8742 Personal history of other diseases of the female genital tract: Secondary | ICD-10-CM | POA: Insufficient documentation

## 2013-03-26 DIAGNOSIS — G8929 Other chronic pain: Secondary | ICD-10-CM

## 2013-03-26 DIAGNOSIS — Y9289 Other specified places as the place of occurrence of the external cause: Secondary | ICD-10-CM | POA: Insufficient documentation

## 2013-03-26 DIAGNOSIS — E119 Type 2 diabetes mellitus without complications: Secondary | ICD-10-CM | POA: Insufficient documentation

## 2013-03-26 DIAGNOSIS — I10 Essential (primary) hypertension: Secondary | ICD-10-CM | POA: Insufficient documentation

## 2013-03-26 DIAGNOSIS — Z96659 Presence of unspecified artificial knee joint: Secondary | ICD-10-CM | POA: Insufficient documentation

## 2013-03-26 DIAGNOSIS — E039 Hypothyroidism, unspecified: Secondary | ICD-10-CM | POA: Insufficient documentation

## 2013-03-26 DIAGNOSIS — Z9889 Other specified postprocedural states: Secondary | ICD-10-CM | POA: Insufficient documentation

## 2013-03-26 DIAGNOSIS — W1809XA Striking against other object with subsequent fall, initial encounter: Secondary | ICD-10-CM | POA: Insufficient documentation

## 2013-03-26 DIAGNOSIS — Z79899 Other long term (current) drug therapy: Secondary | ICD-10-CM | POA: Insufficient documentation

## 2013-03-26 DIAGNOSIS — Y9301 Activity, walking, marching and hiking: Secondary | ICD-10-CM | POA: Insufficient documentation

## 2013-03-26 DIAGNOSIS — S335XXA Sprain of ligaments of lumbar spine, initial encounter: Secondary | ICD-10-CM | POA: Insufficient documentation

## 2013-03-26 DIAGNOSIS — W19XXXA Unspecified fall, initial encounter: Secondary | ICD-10-CM

## 2013-03-26 MED ORDER — TRAMADOL HCL 50 MG PO TABS: 50.0000 mg | ORAL_TABLET | Freq: Four times a day (QID) | ORAL | Status: DC | PRN

## 2013-03-26 MED ORDER — KETOROLAC TROMETHAMINE 60 MG/2ML IM SOLN
60.0000 mg | Freq: Once | INTRAMUSCULAR | Status: AC
  Administered 2013-03-26: 60 mg via INTRAMUSCULAR
  Filled 2013-03-26: qty 2

## 2013-03-26 MED ORDER — CYCLOBENZAPRINE HCL 10 MG PO TABS: 10.0000 mg | ORAL_TABLET | Freq: Two times a day (BID) | ORAL | Status: DC | PRN
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
## 2013-03-26 NOTE — ED Provider Notes (Signed)
CSN: [id number]     Arrival date & time 03/26/13  0719 History   First MD Initiated Contact with Patient 03/26/13 [phone]     Chief Complaint  Patient presents with  . Neck Pain  . Back Pain   (Consider location/radiation/quality/duration/timing/severity/associated sxs/prior Treatment) Patient is a 52 y.o. female presenting with neck pain and back pain. The history is provided by the patient and the EMS personnel.  Neck Pain Associated symptoms: headaches   Associated symptoms: no chest pain and no fever   Back Pain Associated symptoms: headaches   Associated symptoms: no abdominal pain, no chest pain, no dysuria and no fever    Patient status post fall yesterday on wet grass fell backwards striking the back of her head and hitting her tailbone. Patient today with complaint of back of head pain and some neck pain and tailbone pain. Also patient feels that she has reinjured her left knee has a total knee psychotic pain in that area. Patient also has chronic back pain is followed by pain management in the past but is in between pain management providers. Patient's primary care doctor is Dr. Jillyn Williams from [facility]. Patient states that the tailbone pain and knee pain are heat out of 10 back of the head pain is 6/10. Neck pain is 2/10. Denies any chest pain shortness of breath or abdominal pain or nausea or vomiting. Past Medical History  Diagnosis Date  . History of knee replacement, total   . Fever   . Breast lump   . Breast discharge   . Breast pain   . Diabetes mellitus   . Thyroid disease   . Asthma   . Hiatal hernia   . Arthritis   . Depression   . Hypertension   . Bipolar 1 disorder   . Hypothyroidism   . Sleep apnea    Past Surgical History  Procedure Laterality Date  . Cesarean section    . Breast lumpectomy      right  . Hernia repair    . Appendectomy    . Abdominal hysterectomy      partial  . Knee surgery      right  . Myomectomy abdominal  approach  1989  . Myomectomy abdominal approach  1989  . Cardiac catheterization      no significant CAD, nl LV function by 11/08/06 cath  . Total knee revision  06/21/2012    Procedure: TOTAL KNEE REVISION;  Surgeon: Nadara Mustard, MD;  Location: [facility] OR;  Service: Orthopedics;  Laterality: Right;  Revision Right Total Knee Arthroplasty   Family History  Problem Relation Age of Onset  . Cancer Father     colon  . Stroke Father   . Heart disease Father   . Depression Sister    History  Substance Use Topics  . Smoking status: Former Smoker -- 0.50 packs/day for 4 years    Types: Cigarettes    Quit date: 11/13/2010  . Smokeless tobacco: Never Used  . Alcohol Use: No   OB History   Grav Para Term Preterm Abortions TAB SAB Ect Mult Living                 Review of Systems  Constitutional: Negative for fever.  HENT: Negative for congestion.   Eyes: Negative for redness.  Respiratory: Negative for shortness of breath.   Cardiovascular: Negative for chest pain.  Gastrointestinal: Negative for abdominal pain.  Genitourinary: Negative for dysuria.  Musculoskeletal: Positive for  back pain and neck pain.  Skin: Negative for rash and wound.  Neurological: Positive for headaches.  Hematological: Does not bruise/bleed easily.  Psychiatric/Behavioral: Negative for confusion.    Allergies  Aspirin and Sulfa antibiotics  Home Medications   Current Outpatient Rx  Name  Route  Sig  Dispense  Refill  . albuterol (PROVENTIL HFA;VENTOLIN HFA) 108 (90 BASE) MCG/ACT inhaler   Inhalation   Inhale 2 puffs into the lungs every 6 (six) hours as needed for wheezing or shortness of breath. Shortness of breath         . ARIPiprazole (ABILIFY) 5 MG tablet   Oral   Take 5 mg by mouth at bedtime.          . Asenapine Maleate (SAPHRIS SL)   Sublingual   Place 2 tablets under the tongue at bedtime.          Marland Kitchen atenolol (TENORMIN) 25 MG tablet   Oral   Take 12.5 mg by mouth daily.          . diazepam (VALIUM) 10 MG tablet   Oral   Take 10 mg by mouth 3 (three) times daily.         . diclofenac sodium (VOLTAREN) 1 % GEL   Topical   Apply 2 g topically 3 (three) times daily. Right knee         . fluticasone (FLONASE) 50 MCG/ACT nasal spray   Nasal   Place 2 sprays into the nose daily as needed (congestion).         . gabapentin (NEURONTIN) 100 MG capsule   Oral   Take 100 mg by mouth 2 (two) times daily.          Marland Kitchen ibuprofen (ADVIL,MOTRIN) 200 MG tablet   Oral   Take 400 mg by mouth every 6 (six) hours as needed for pain. For pain         . levothyroxine (SYNTHROID, LEVOTHROID) 125 MCG tablet   Oral   Take 125 mcg by mouth daily.          . Lidocaine 0.5 % GEL   Apply externally   Apply 1 application topically 2 (two) times daily. Right knee         . metFORMIN (GLUCOPHAGE) 1000 MG tablet   Oral   Take 1,000 mg by mouth 2 (two) times daily with a meal.         . omeprazole (PRILOSEC) 40 MG capsule   Oral   Take 40 mg by mouth daily as needed (acid reflex).          . Oxycodone HCl 10 MG TABS   Oral   Take 10 mg by mouth every 4 (four) hours as needed (for pain).         . rosuvastatin (CRESTOR) 20 MG tablet   Oral   Take 20 mg by mouth daily.         . cyclobenzaprine (FLEXERIL) 10 MG tablet   Oral   Take 1 tablet (10 mg total) by mouth 2 (two) times daily as needed for muscle spasms.   20 tablet   0   . traMADol (ULTRAM) 50 MG tablet   Oral   Take 1 tablet (50 mg total) by mouth every 6 (six) hours as needed for pain.   15 tablet   0    BP 144/92  Pulse 75  Temp(Src) 97.7 F (36.5 C) (Oral)  Resp 18  SpO2 99% Physical Exam  Nursing note and vitals reviewed. Constitutional: She is oriented to person, place, and time. She appears well-developed and well-nourished. No distress.  HENT:  Head: Normocephalic and atraumatic.  Mouth/Throat: Oropharynx is clear and moist.  Eyes: Conjunctivae and EOM are normal.  Pupils are equal, round, and reactive to light.  Neck: Normal range of motion. Neck supple.  Pulmonary/Chest: Effort normal and breath sounds normal.  Abdominal: Soft. Bowel sounds are normal.  Musculoskeletal: She exhibits tenderness. She exhibits no edema.  Patient with mild tenderness to palpation to the lumbar spine increased tenderness over the tailbone area. Left knee diffuse tenderness to palpation no significant swelling or deformity. Patient with slight redness and swelling to the right forearm no deformity.  Neurological: She is alert and oriented to person, place, and time. No cranial nerve deficit. She exhibits normal muscle tone. Coordination normal.  Skin: Skin is warm. No rash noted.    ED Course  Procedures (including critical care time) Labs Review Labs Reviewed - No data to display Imaging Review Dg Lumbar Spine Complete  03/26/2013   CLINICAL DATA:  Fall, low back pain.  EXAM: LUMBAR SPINE - COMPLETE 4+ VIEW  COMPARISON:  None.  FINDINGS: Normal alignment. No fracture. Degenerative facet disease in the lower lumbar spine. Degenerative spurring scratch head degenerative disc disease changes in the lower thoracic spine. Mild sclerosis and irregularity of the SI joints suggesting sacroiliitis bilaterally.  IMPRESSION: No acute bony abnormality.   Electronically Signed   By: Charlett Nose M.D.   On: 03/26/2013 09:03   Ct Head Wo Contrast  03/26/2013   CLINICAL DATA:  Altercation. Fall, headache, posterior neck pain.  EXAM: CT HEAD WITHOUT CONTRAST  TECHNIQUE: Contiguous axial images were obtained from the base of the skull through the vertex without intravenous contrast.  COMPARISON:  07/07/2006  FINDINGS: No acute intracranial abnormality. Specifically, no hemorrhage, hydrocephalus, mass lesion, acute infarction, or significant intracranial injury. No acute calvarial abnormality. Visualized paranasal sinuses and mastoids clear. Orbital soft tissues unremarkable.  IMPRESSION: No  intracranial abnormality.   Electronically Signed   By: Charlett Nose M.D.   On: 03/26/2013 08:43   Ct Cervical Spine Wo Contrast  03/26/2013   CLINICAL DATA:  Fall a, posterior neck pain.  EXAM: CT CERVICAL SPINE WITHOUT CONTRAST  TECHNIQUE: Multidetector CT imaging of the cervical spine was performed without intravenous contrast. Multiplanar CT image reconstructions were also generated.  COMPARISON:  None.  FINDINGS: Normal alignment. Degenerative disc and facet disease throughout the cervical spine. Prevertebral soft tissues are normal. No epidural or paraspinal hematoma. No fracture.  Incidentally noted is probable dislocation of both temporomandibular joints. Recommend clinical correlation.  IMPRESSION: No acute bony abnormality. Degenerative changes.  Probable dislocation of both temporomandibular joints. Recommend clinical correlation.   Electronically Signed   By: Charlett Nose M.D.   On: 03/26/2013 08:48   Dg Knee Complete 4 Views Right  03/26/2013   CLINICAL DATA:  Fall, right knee pain.  EXAM: RIGHT KNEE - COMPLETE 4+ VIEW  COMPARISON:  02/16/2013  FINDINGS: Changes of right knee replacement, stable. No hardware or bony complicating feature. No fracture, subluxation or dislocation. No joint effusion.  IMPRESSION: Right knee replacement. No complicating or acute findings.   Electronically Signed   By: Charlett Nose M.D.   On: 03/26/2013 09:04    EKG Interpretation   None       MDM   1. Fall, initial encounter   2. Chronic knee pain, right   3. Lumbar strain,  initial encounter   4. Head injury, initial encounter   5. Chronic back pain    X-ray workup without the any evidence of acute injuries. No evidence of coccyx fracture. Will recommend doughnut pillow for patient followup with her primary care Dr. Stann Mainland continue treatment for the pain with Flexeril and tramadol.    Shelda Jakes, MD 03/26/13 [phone]

## 2013-04-10 ENCOUNTER — Encounter: Payer: Medicare HMO | Admitting: Physical Medicine & Rehabilitation

## 2013-04-13 ENCOUNTER — Emergency Department (HOSPITAL_COMMUNITY): Payer: Medicare HMO

## 2013-04-13 ENCOUNTER — Emergency Department (HOSPITAL_COMMUNITY)
Admission: EM | Admit: 2013-04-13 | Discharge: 2013-04-13 | Disposition: A | Discharge status: Home / Self Care | Payer: Medicare HMO | Attending: Emergency Medicine | Admitting: Emergency Medicine

## 2013-04-13 ENCOUNTER — Encounter (HOSPITAL_COMMUNITY): Payer: Self-pay | Admitting: Emergency Medicine

## 2013-04-13 DIAGNOSIS — M1990 Unspecified osteoarthritis, unspecified site: Secondary | ICD-10-CM | POA: Insufficient documentation

## 2013-04-13 DIAGNOSIS — Z8742 Personal history of other diseases of the female genital tract: Secondary | ICD-10-CM | POA: Insufficient documentation

## 2013-04-13 DIAGNOSIS — E039 Hypothyroidism, unspecified: Secondary | ICD-10-CM | POA: Insufficient documentation

## 2013-04-13 DIAGNOSIS — W19XXXA Unspecified fall, initial encounter: Secondary | ICD-10-CM

## 2013-04-13 DIAGNOSIS — M25561 Pain in right knee: Secondary | ICD-10-CM

## 2013-04-13 DIAGNOSIS — Z79899 Other long term (current) drug therapy: Secondary | ICD-10-CM | POA: Insufficient documentation

## 2013-04-13 DIAGNOSIS — J45909 Unspecified asthma, uncomplicated: Secondary | ICD-10-CM | POA: Insufficient documentation

## 2013-04-13 DIAGNOSIS — Z87891 Personal history of nicotine dependence: Secondary | ICD-10-CM | POA: Insufficient documentation

## 2013-04-13 DIAGNOSIS — E119 Type 2 diabetes mellitus without complications: Secondary | ICD-10-CM | POA: Insufficient documentation

## 2013-04-13 DIAGNOSIS — S8990XA Unspecified injury of unspecified lower leg, initial encounter: Secondary | ICD-10-CM | POA: Insufficient documentation

## 2013-04-13 DIAGNOSIS — I10 Essential (primary) hypertension: Secondary | ICD-10-CM | POA: Insufficient documentation

## 2013-04-13 DIAGNOSIS — Y9389 Activity, other specified: Secondary | ICD-10-CM | POA: Insufficient documentation

## 2013-04-13 DIAGNOSIS — W1809XA Striking against other object with subsequent fall, initial encounter: Secondary | ICD-10-CM | POA: Insufficient documentation

## 2013-04-13 DIAGNOSIS — Z8719 Personal history of other diseases of the digestive system: Secondary | ICD-10-CM | POA: Insufficient documentation

## 2013-04-13 DIAGNOSIS — F319 Bipolar disorder, unspecified: Secondary | ICD-10-CM | POA: Insufficient documentation

## 2013-04-13 DIAGNOSIS — Y929 Unspecified place or not applicable: Secondary | ICD-10-CM | POA: Insufficient documentation

## 2013-04-13 MED ORDER — KETOROLAC TROMETHAMINE 30 MG/ML IJ SOLN: 30.0000 mg | Freq: Once | INTRAMUSCULAR | Status: DC

## 2013-04-13 MED ORDER — OXYCODONE-ACETAMINOPHEN 5-325 MG PO TABS
2.0000 | ORAL_TABLET | Freq: Once | ORAL | Status: AC
  Administered 2013-04-13: 2 via ORAL
  Filled 2013-04-13: qty 2

## 2013-04-13 MED ORDER — HYDROMORPHONE HCL PF 1 MG/ML IJ SOLN
1.0000 mg | Freq: Once | INTRAMUSCULAR | Status: AC
  Administered 2013-04-13: 1 mg via INTRAMUSCULAR
  Filled 2013-04-13: qty 1

## 2013-04-13 MED ORDER — KETOROLAC TROMETHAMINE 60 MG/2ML IM SOLN
60.0000 mg | Freq: Once | INTRAMUSCULAR | Status: AC
  Administered 2013-04-13: 60 mg via INTRAMUSCULAR
  Filled 2013-04-13: qty 2

## 2013-04-13 NOTE — ED Provider Notes (Signed)
CSN: [id number]     Arrival date & time 04/13/13  1718 History   First MD Initiated Contact with Patient 04/13/13 1722     Chief Complaint  Patient presents with  . Knee Pain   (Consider location/radiation/quality/duration/timing/severity/associated sxs/prior Treatment) The history is provided by the patient. No language interpreter was used.   Patient is a 52 year old American female with past medical history of a joint replacement to the right knee. She has had complications with the replacement as a result it was replaced a second time several years ago. Since then she's continued to have the right knee give out on her whenever she bears weight. She has to use a cane to walk.  She was boarding a bus today when her cane was broken. When she got off the bus her leg gave way and she fell to the ground impacting her right knee. Since then she has had pain to the knee which he rates at a 9/10. Results comes emergency Department. She denies any other injuries.  Past Medical History  Diagnosis Date  . History of knee replacement, total   . Fever   . Breast lump   . Breast discharge   . Breast pain   . Diabetes mellitus   . Thyroid disease   . Asthma   . Hiatal hernia   . Arthritis   . Depression   . Hypertension   . Bipolar 1 disorder   . Hypothyroidism   . Sleep apnea    Past Surgical History  Procedure Laterality Date  . Cesarean section    . Breast lumpectomy      right  . Hernia repair    . Appendectomy    . Abdominal hysterectomy      partial  . Knee surgery      right  . Myomectomy abdominal approach  1989  . Myomectomy abdominal approach  1989  . Cardiac catheterization      no significant CAD, nl LV function by 11/08/06 cath  . Total knee revision  06/21/2012    Procedure: TOTAL KNEE REVISION;  Surgeon: Nadara Mustard, MD;  Location: [facility] OR;  Service: Orthopedics;  Laterality: Right;  Revision Right Total Knee Arthroplasty   Family History  Problem Relation Age of  Onset  . Cancer Father     colon  . Stroke Father   . Heart disease Father   . Depression Sister    History  Substance Use Topics  . Smoking status: Former Smoker -- 0.50 packs/day for 4 years    Types: Cigarettes    Quit date: 11/13/2010  . Smokeless tobacco: Never Used  . Alcohol Use: No   OB History   Grav Para Term Preterm Abortions TAB SAB Ect Mult Living                 Review of Systems  Constitutional: Negative for fever and chills.  Respiratory: Negative for cough and shortness of breath.   Gastrointestinal: Negative for nausea, vomiting, diarrhea and constipation.  Genitourinary: Negative for dysuria, urgency and frequency.  Musculoskeletal: Positive for arthralgias (right knee).  Skin: Negative for color change and wound.  All other systems reviewed and are negative.    Allergies  Aspirin and Sulfa antibiotics  Home Medications   Current Outpatient Rx  Name  Route  Sig  Dispense  Refill  . albuterol (PROVENTIL HFA;VENTOLIN HFA) 108 (90 BASE) MCG/ACT inhaler   Inhalation   Inhale 2 puffs into the lungs every 6 (  six) hours as needed for wheezing or shortness of breath. Shortness of breath         . ARIPiprazole (ABILIFY) 5 MG tablet   Oral   Take 5 mg by mouth at bedtime.          . Asenapine Maleate (SAPHRIS SL)   Sublingual   Place 2 tablets under the tongue at bedtime.          Marland Kitchen atenolol (TENORMIN) 25 MG tablet   Oral   Take 12.5 mg by mouth daily.         . diazepam (VALIUM) 10 MG tablet   Oral   Take 10 mg by mouth 3 (three) times daily.         . diclofenac sodium (VOLTAREN) 1 % GEL   Topical   Apply 2 g topically 3 (three) times daily. Right knee         . fluticasone (FLONASE) 50 MCG/ACT nasal spray   Nasal   Place 2 sprays into the nose daily as needed (congestion).         . gabapentin (NEURONTIN) 600 MG tablet   Oral   Take 600 mg by mouth 2 (two) times daily.         Marland Kitchen ibuprofen (ADVIL,MOTRIN) 200 MG tablet    Oral   Take 400 mg by mouth every 6 (six) hours as needed for pain. For pain         . levothyroxine (SYNTHROID, LEVOTHROID) 125 MCG tablet   Oral   Take 125 mcg by mouth daily.          . Lidocaine 0.5 % GEL   Apply externally   Apply 1 application topically 2 (two) times daily. Right knee         . metFORMIN (GLUCOPHAGE) 1000 MG tablet   Oral   Take 1,000 mg by mouth 2 (two) times daily with a meal.         . omeprazole (PRILOSEC) 40 MG capsule   Oral   Take 40 mg by mouth daily as needed (acid reflex).          . Oxycodone HCl 10 MG TABS   Oral   Take 10 mg by mouth every 4 (four) hours as needed (for pain).         . rosuvastatin (CRESTOR) 20 MG tablet   Oral   Take 20 mg by mouth daily.         . traMADol (ULTRAM) 50 MG tablet   Oral   Take 1 tablet (50 mg total) by mouth every 6 (six) hours as needed for pain.   15 tablet   0    BP 124/99  Pulse 83  Temp(Src) 98 F (36.7 C) (Oral)  Resp 20  SpO2 98% Physical Exam  Nursing note and vitals reviewed. Constitutional: She is oriented to person, place, and time. She appears well-developed and well-nourished. No distress.  HENT:  Head: Normocephalic and atraumatic.  Eyes: Pupils are equal, round, and reactive to light.  Neck: Normal range of motion.  Cardiovascular: Normal rate, regular rhythm, normal heart sounds and intact distal pulses.   Pulmonary/Chest: Effort normal. No respiratory distress. She has no wheezes. She exhibits no tenderness.  Abdominal: Soft. Bowel sounds are normal. She exhibits no distension. There is no tenderness. There is no rebound and no guarding.  Musculoskeletal:       Right knee: She exhibits no deformity, no laceration, normal alignment, no LCL laxity, no  bony tenderness, normal meniscus and no MCL laxity. Tenderness found.  Scar to the anterior aspect of the right knee.   Neurological: She is alert and oriented to person, place, and time. She has normal strength. No  cranial nerve deficit or sensory deficit. She exhibits normal muscle tone. Coordination and gait normal.  Skin: Skin is warm and dry.    ED Course  Procedures (including critical care time) Labs Review Labs Reviewed - No data to display Imaging Review Dg Knee Complete 4 Views Right  04/13/2013   CLINICAL DATA:  Right knee pain and swelling.  EXAM: RIGHT KNEE - COMPLETE 4+ VIEW  COMPARISON:  03/26/2013  FINDINGS: Right total knee arthroplasty is reidentified. No evidence for hardware failure. No fracture line is visualized. No radiopaque foreign body. No soft tissue abnormality.  IMPRESSION: No change in appearance of right total knee arthroplasty, no acute finding.   Electronically Signed   By: Christiana Pellant M.D.   On: 04/13/2013 19:26    EKG Interpretation   None       MDM  Patient is a 52 year old African American female with past medical history of joint replacement to the right knee and comes emergency department today with pain to the right knee after a fall. Physical exam as above. Initial workup included x-ray of the right knee. X-ray demonstrated no fracture and no change in the appearance of the arthroplasty. Pain was controlled with Toradol, Dilaudid, and Percocet. Doubt septic arthritis with no fevers, and good range of motion on exam.  With no other injury she was felt to require further evaluation at this time. She was felt to be stable for discharge. She was instructed to followup with her primary care physician and orthopedics for her knee. She was instructed to return to the emergency department with any other falls or concerns. Imaging was reviewed by myself and considered and medical decision-making. Imaging was interpreted by radiology. Care was discussed my attending Dr. Romeo Apple.    1. Knee pain, right   2. Fall, initial encounter       Bethann Berkshire, MD 04/13/13 2013

## 2013-04-13 NOTE — ED Notes (Signed)
Caroline Williams, NAD, calm, interactive, talking on phone, lying flat on R side, c/o R knee pain, denies other pains or sx. CBIR. Pain unchanged after toradol.

## 2013-04-13 NOTE — ED Notes (Signed)
Per [facility], pt was getting off the [employer] bus lost her balance fell on her knee onto a metal grate. Pt has a hx of bilateral knee replacement and broke her cane while getting on the [employer] bus. BP 158/84, HR 88, CBG 81, hx of diabetes, pt denies eating all day today

## 2013-04-13 NOTE — ED Notes (Signed)
Pt not in room, pt in xray.  

## 2013-04-13 NOTE — ED Notes (Signed)
Dr. Noreene Filbert into see pt at time of d/c, d/c plan explained, RN present, (team d/c).

## 2013-04-14 NOTE — ED Provider Notes (Signed)
Medical screening examination/treatment/procedure(s) were conducted as a shared visit with resident physician and myself.  I personally evaluated the patient during the encounter.  I interviewed and examined the patient. Lungs are CTAB. Cardiac exam wnl. Abdomen soft.  Imaging neg, pain control given. Will rec f/u w/ pcp/ortho.   Junius Argyle, MD 04/14/13 1106

## 2013-05-15 ENCOUNTER — Encounter: Payer: Medicare HMO | Admitting: Physical Medicine & Rehabilitation

## 2013-06-20 ENCOUNTER — Encounter: Payer: Medicare HMO | Attending: Physical Medicine & Rehabilitation | Admitting: Physical Medicine & Rehabilitation

## 2013-06-20 ENCOUNTER — Encounter: Payer: Self-pay | Admitting: Physical Medicine & Rehabilitation

## 2013-06-20 ENCOUNTER — Encounter (INDEPENDENT_AMBULATORY_CARE_PROVIDER_SITE_OTHER): Payer: Self-pay

## 2013-06-20 VITALS — BP 122/65 | HR 93 | Resp 16 | Ht 65.0 in | Wt 300.0 lb

## 2013-06-20 DIAGNOSIS — E1142 Type 2 diabetes mellitus with diabetic polyneuropathy: Secondary | ICD-10-CM

## 2013-06-20 DIAGNOSIS — M25561 Pain in right knee: Secondary | ICD-10-CM

## 2013-06-20 DIAGNOSIS — J45909 Unspecified asthma, uncomplicated: Secondary | ICD-10-CM | POA: Insufficient documentation

## 2013-06-20 DIAGNOSIS — M25569 Pain in unspecified knee: Secondary | ICD-10-CM

## 2013-06-20 DIAGNOSIS — Z5181 Encounter for therapeutic drug level monitoring: Secondary | ICD-10-CM

## 2013-06-20 DIAGNOSIS — F17200 Nicotine dependence, unspecified, uncomplicated: Secondary | ICD-10-CM | POA: Insufficient documentation

## 2013-06-20 DIAGNOSIS — G8929 Other chronic pain: Secondary | ICD-10-CM

## 2013-06-20 DIAGNOSIS — Z79899 Other long term (current) drug therapy: Secondary | ICD-10-CM

## 2013-06-20 DIAGNOSIS — E1149 Type 2 diabetes mellitus with other diabetic neurological complication: Secondary | ICD-10-CM

## 2013-06-20 DIAGNOSIS — Z96659 Presence of unspecified artificial knee joint: Secondary | ICD-10-CM | POA: Insufficient documentation

## 2013-06-20 DIAGNOSIS — E1140 Type 2 diabetes mellitus with diabetic neuropathy, unspecified: Secondary | ICD-10-CM

## 2013-06-20 DIAGNOSIS — E039 Hypothyroidism, unspecified: Secondary | ICD-10-CM | POA: Insufficient documentation

## 2013-06-20 DIAGNOSIS — M1710 Unilateral primary osteoarthritis, unspecified knee: Secondary | ICD-10-CM | POA: Insufficient documentation

## 2013-06-20 DIAGNOSIS — I10 Essential (primary) hypertension: Secondary | ICD-10-CM | POA: Insufficient documentation

## 2013-06-20 MED ORDER — MELOXICAM 15 MG PO TABS: 15.0000 mg | ORAL_TABLET | Freq: Every day | ORAL | Status: DC

## 2013-06-20 NOTE — Progress Notes (Signed)
Subjective:    Patient ID: Caroline Williams, female    DOB: Jul 11, 1960, 53 y.o.   MRN: [id number]  HPI  This is an initial evaluation for Caroline Williams for chronic right knee pain. She has had a right knee replacement and a revision of the same knee in January of 2014. She fell on a metal grate in October of last year which worsened her problems. She felt that she was doing fairly well after the most recent revision until she fell. She had been exercising, going to school, and had been trying to stay active. She feels now at this point that she's back to square one. She has not followed up with ortho to this point.   She has been in aquatic therapy for about 3 weeks now but hasn't experienced any lasting benefit to this point.   She has been struggling with her weight and diabetes for sometime. She admits to poor habits and combined with her lack of activity, she has continued to gain weight. She has looked into bariatric surgery as potential solution.   She has been used to being independent and the increasing pain and problems have presented quite a struggle from an emotional standpoint.    Pain Inventory Average Pain 9 Pain Right Now 7 My pain is stabbing and aching  In the last 24 hours, has pain interfered with the following? General activity 9 Relation with others 10 Enjoyment of life 8 What TIME of day is your pain at its worst? morning and evening Sleep (in general) Fair  Pain is worse with: walking, standing and some activites Pain improves with: rest, heat/ice, therapy/exercise, medication and TENS Relief from Meds: 8  Mobility use a cane do you drive?  no use a wheelchair Do you have any goals in this area?  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 Cancer Father     colon  . Stroke Father   . Heart disease Father   . Depression Sister    History   Social History  . Marital Status: Single    Spouse Name: N/A    Number of Children: N/A  . Years of Education: N/A   Social History Main Topics  . Smoking status: Current Some Day Smoker -- 0.50 packs/day for 4 years    Types: Cigarettes    Last Attempt to Quit: 11/13/2010  . Smokeless tobacco: Never Used  . Alcohol Use: No  . Drug Use: No  . Sexual Activity: Yes    Birth Control/ Protection: None   Other Topics Concern  . None   Social History Narrative  . None   Past Surgical History  Procedure Laterality Date  . Cesarean section    . Breast lumpectomy      right  . Hernia repair    . Appendectomy    . Abdominal hysterectomy      partial  . Knee surgery      right  . Myomectomy abdominal approach  1989  . Myomectomy abdominal approach  1989  . Cardiac catheterization      no significant CAD, nl LV function by 11/08/06 cath  . Total knee revision  06/21/2012  Procedure: TOTAL KNEE REVISION;  Surgeon: Newt Minion, MD;  Location: [place];  Service: Orthopedics;  Laterality: Right;  Revision Right Total Knee Arthroplasty   Past Medical History  Diagnosis Date  . History of knee replacement, total   . Fever   . Breast lump   . Breast discharge   . Breast pain   . Diabetes mellitus   . Thyroid disease   . Asthma   . Hiatal hernia   . Arthritis   . Depression   . Hypertension   . Bipolar 1 disorder   . Hypothyroidism   . Sleep apnea    BP 122/65  Pulse 93  Resp 16  Ht 5\' 5"  (1.651 m)  Wt 300 lb (136.079 kg)  BMI 49.92 kg/m2  SpO2 99%     Review of Systems  Constitutional: Positive for unexpected weight change.  Respiratory: Positive for apnea.   Gastrointestinal: Positive for constipation.   Genitourinary: Positive for difficulty urinating.  Musculoskeletal: Positive for back pain and gait problem.  Neurological: Positive for weakness and numbness.  Psychiatric/Behavioral: Positive for dysphoric mood. The patient is nervous/anxious.   All other systems reviewed and are negative.       Objective:   Physical Exam   General: Alert and oriented x 3, No apparent distress. Morbidly obese HEENT: Head is normocephalic, atraumatic, PERRLA, EOMI, sclera anicteric, oral mucosa pink and moist, dentition intact, ext ear canals clear,  Neck: Supple without JVD or lymphadenopathy Heart: Reg rate and rhythm. No murmurs rubs or gallops Chest: CTA bilaterally without wheezes, rales, or rhonchi; no distress Abdomen: Soft, non-tender, non-distended, bowel sounds positive. Extremities: No clubbing, cyanosis, or edema. Pulses are 2+ Skin: Clean and intact without signs of breakdown. tka scar noted Neuro: Pt is cognitively appropriate with normal insight, memory, and awareness. Cranial nerves 2-12 are intact. Sensory exam is normal except for impaired light touch over distal legs. Reflexes are 2+ in all 4's. Fine motor coordination is intact. No tremors. Motor function is grossly 5/5.  Musculoskeletal: has 90 degrees flexion right knee but not beyond. Extension is almost full but has difficulty extending against gravity. There is no frank knee instability nor crepitus. She has pain with palpation over the medial tibial component of the prosthesis. Minimal pain over the pain over the patella, lateral knee and femur. She has severe antalgia with weight bearing.  Psych: Pt's affect is appropriate. Pt is cooperative. She is quite pleasant.         Assessment & Plan:  1. Right knee pain, hx of OA and hx of knee replacement with revision 2. Morbid obesity 3. DM2 with neuropathy    Plan: 1. I am willing to introduce a long acting agent for pain control such as fentanyl patch in addition to her  oxycodone.. I reviewed with the patient today. We will first need have a clean,consistent UDS.  2. I recommend that she see her orthopedic surgeon for his opinion as well. There has obvioulsy been a change since her fall in October. 3. Meloxicam 15mg  po daily was added 4. Continue with topical treatments which are appropriate. 5. Continue with aquatic therapy as she is 6. Will make a referral to nutrition as well as she is whole heartedly interested in weight loss and knows that it's a big problem for her. 7. Follow up in 1 month. 45 minutes of face to face patient care time were spent during this visit. All questions were encouraged and answered.

## 2013-06-20 NOTE — Patient Instructions (Signed)
CONTACT DR. DUDA FOR FOLLOW UP OF YOUR RIGHT KNEE.   I WILL BE WILLING TO PRESCRIBE YOUR PAIN MEDICATIONS IF YOUR URINE SAMPLE IS CONSISTENT.

## 2013-06-25 ENCOUNTER — Telehealth: Payer: Self-pay | Admitting: Physical Medicine & Rehabilitation

## 2013-06-25 NOTE — Telephone Encounter (Signed)
UDS + for THC.  Non-narcotic mgt at this time. We can re-check next month---if clean, would consider assuming her narcotic pain rx's. Any further positive testing would mean IMMEDIATE discharge from our clinic.  zts

## 2013-06-26 NOTE — Telephone Encounter (Signed)
FYI placed in patient chart regarding inconsistent UDS.

## 2013-07-24 ENCOUNTER — Ambulatory Visit: Payer: Medicare HMO | Admitting: Physical Medicine & Rehabilitation

## 2013-08-22 ENCOUNTER — Emergency Department (HOSPITAL_COMMUNITY): Payer: Medicare HMO

## 2013-08-22 ENCOUNTER — Emergency Department (HOSPITAL_COMMUNITY)
Admission: EM | Admit: 2013-08-22 | Discharge: 2013-08-22 | Disposition: A | Discharge status: Home / Self Care | Payer: Medicare HMO | Attending: Emergency Medicine | Admitting: Emergency Medicine

## 2013-08-22 ENCOUNTER — Encounter (HOSPITAL_COMMUNITY): Payer: Self-pay | Admitting: Emergency Medicine

## 2013-08-22 DIAGNOSIS — F17200 Nicotine dependence, unspecified, uncomplicated: Secondary | ICD-10-CM | POA: Diagnosis not present

## 2013-08-22 DIAGNOSIS — IMO0002 Reserved for concepts with insufficient information to code with codable children: Secondary | ICD-10-CM | POA: Insufficient documentation

## 2013-08-22 DIAGNOSIS — M25561 Pain in right knee: Secondary | ICD-10-CM

## 2013-08-22 DIAGNOSIS — Z791 Long term (current) use of non-steroidal anti-inflammatories (NSAID): Secondary | ICD-10-CM | POA: Insufficient documentation

## 2013-08-22 DIAGNOSIS — Z79899 Other long term (current) drug therapy: Secondary | ICD-10-CM | POA: Diagnosis not present

## 2013-08-22 DIAGNOSIS — G8929 Other chronic pain: Secondary | ICD-10-CM | POA: Insufficient documentation

## 2013-08-22 DIAGNOSIS — Z9889 Other specified postprocedural states: Secondary | ICD-10-CM | POA: Diagnosis not present

## 2013-08-22 DIAGNOSIS — S8990XA Unspecified injury of unspecified lower leg, initial encounter: Secondary | ICD-10-CM | POA: Diagnosis present

## 2013-08-22 DIAGNOSIS — G4730 Sleep apnea, unspecified: Secondary | ICD-10-CM | POA: Diagnosis not present

## 2013-08-22 DIAGNOSIS — Y929 Unspecified place or not applicable: Secondary | ICD-10-CM | POA: Diagnosis not present

## 2013-08-22 DIAGNOSIS — E119 Type 2 diabetes mellitus without complications: Secondary | ICD-10-CM | POA: Insufficient documentation

## 2013-08-22 DIAGNOSIS — W010XXA Fall on same level from slipping, tripping and stumbling without subsequent striking against object, initial encounter: Secondary | ICD-10-CM | POA: Diagnosis not present

## 2013-08-22 DIAGNOSIS — F319 Bipolar disorder, unspecified: Secondary | ICD-10-CM | POA: Diagnosis not present

## 2013-08-22 DIAGNOSIS — M129 Arthropathy, unspecified: Secondary | ICD-10-CM | POA: Diagnosis not present

## 2013-08-22 DIAGNOSIS — I10 Essential (primary) hypertension: Secondary | ICD-10-CM | POA: Insufficient documentation

## 2013-08-22 DIAGNOSIS — S99929A Unspecified injury of unspecified foot, initial encounter: Principal | ICD-10-CM

## 2013-08-22 DIAGNOSIS — Y939 Activity, unspecified: Secondary | ICD-10-CM | POA: Diagnosis not present

## 2013-08-22 DIAGNOSIS — E669 Obesity, unspecified: Secondary | ICD-10-CM | POA: Insufficient documentation

## 2013-08-22 DIAGNOSIS — E039 Hypothyroidism, unspecified: Secondary | ICD-10-CM | POA: Insufficient documentation

## 2013-08-22 DIAGNOSIS — Z96659 Presence of unspecified artificial knee joint: Secondary | ICD-10-CM | POA: Insufficient documentation

## 2013-08-22 DIAGNOSIS — J45909 Unspecified asthma, uncomplicated: Secondary | ICD-10-CM | POA: Diagnosis not present

## 2013-08-22 DIAGNOSIS — S99919A Unspecified injury of unspecified ankle, initial encounter: Principal | ICD-10-CM

## 2013-08-22 DIAGNOSIS — Z8742 Personal history of other diseases of the female genital tract: Secondary | ICD-10-CM | POA: Diagnosis not present

## 2013-08-22 MED ORDER — OXYCODONE-ACETAMINOPHEN 5-325 MG PO TABS
1.0000 | ORAL_TABLET | Freq: Once | ORAL | Status: AC
  Administered 2013-08-22: 1 via ORAL
  Filled 2013-08-22: qty 1

## 2013-08-22 MED ORDER — OXYCODONE-ACETAMINOPHEN 5-325 MG PO TABS: 2.0000 | ORAL_TABLET | Freq: Once | ORAL | Status: DC

## 2013-08-22 NOTE — ED Provider Notes (Signed)
CSN: [id number]     Arrival date & time 08/22/13  [id number] History  This chart was scribed for non-physician practitioner, Jamse Mead, PA-C,working with Richarda Blade, MD, by Marlowe Kays, ED Scribe.  This patient was seen in room TR09C/TR09C and the patient's care was started at 11:22 AM.  Chief Complaint  Patient presents with  . Knee Pain   The history is provided by the patient. No language interpreter was used.   HPI Comments:  Caroline Williams is a 53 y.o. obese female, brought in by EMS, with h/o chronic right knee pain and right knee replacement who presents to the Emergency Department complaining of severe throbbing right knee pain without radiation that started approximately five months ago secondary to a fall. She states she fell again directly on her right knee one week ago secondary to slipping on ice. Pt reports taking Ibuprofen 800 mg with no relief. She states she took Oxycodone last night with moderate relief. She reports taking Dilaudid normally but is now out. She states touching the knee makes the pain worse. She states she is unable to bear weight on the knee. She denies LOC, head injury, right ankle pain or thigh pain.Pt states she sees Dr. Sharol Given.   Past Medical History  Diagnosis Date  . History of knee replacement, total   . Fever   . Breast lump   . Breast discharge   . Breast pain   . Diabetes mellitus   . Thyroid disease   . Asthma   . Hiatal hernia   . Arthritis   . Depression   . Hypertension   . Bipolar 1 disorder   . Hypothyroidism   . Sleep apnea    Past Surgical History  Procedure Laterality Date  . Cesarean section    . Breast lumpectomy      right  . Hernia repair    . Appendectomy    . Abdominal hysterectomy      partial  . Knee surgery      right  . Myomectomy abdominal approach  1989  . Myomectomy abdominal approach  1989  . Cardiac catheterization      no significant CAD, nl LV function by 11/08/06 cath  . Total knee revision   06/21/2012    Procedure: TOTAL KNEE REVISION;  Surgeon: Newt Minion, MD;  Location: [place];  Service: Orthopedics;  Laterality: Right;  Revision Right Total Knee Arthroplasty   Family History  Problem Relation Age of Onset  . Cancer Father     colon  . Stroke Father   . Heart disease Father   . Depression Sister    History  Substance Use Topics  . Smoking status: Current Some Day Smoker -- 0.50 packs/day for 4 years    Types: Cigarettes    Last Attempt to Quit: 11/13/2010  . Smokeless tobacco: Never Used  . Alcohol Use: No   OB History   Grav Para Term Preterm Abortions TAB SAB Ect Mult Living                 Review of Systems  Musculoskeletal: Positive for arthralgias (right knee pain).  Neurological: Negative for syncope.  All other systems reviewed and are negative.    Allergies  Aspirin and Sulfa antibiotics  Home Medications   Current Outpatient Rx  Name  Route  Sig  Dispense  Refill  . albuterol (PROVENTIL HFA;VENTOLIN HFA) 108 (90 BASE) MCG/ACT inhaler   Inhalation   Inhale 2 puffs into  the lungs every 6 (six) hours as needed for wheezing or shortness of breath. Shortness of breath         . ARIPiprazole (ABILIFY) 5 MG tablet   Oral   Take 5 mg by mouth at bedtime.          . Asenapine Maleate (SAPHRIS SL)   Sublingual   Place 2 tablets under the tongue at bedtime.          Marland Kitchen atenolol (TENORMIN) 25 MG tablet   Oral   Take 12.5 mg by mouth daily.         Marland Kitchen buPROPion (WELLBUTRIN XL) 300 MG 24 hr tablet   Oral   Take 300 mg by mouth daily.          . Cyanocobalamin (B-12 PO)   Oral   Take 1 tablet by mouth daily.          . diazepam (VALIUM) 10 MG tablet   Oral   Take 10 mg by mouth 3 (three) times daily.         . diclofenac sodium (VOLTAREN) 1 % GEL   Topical   Apply 2 g topically 3 (three) times daily. Right knee         . fluticasone (FLONASE) 50 MCG/ACT nasal spray   Nasal   Place 2 sprays into the nose daily as needed  (congestion).         . gabapentin (NEURONTIN) 600 MG tablet   Oral   Take 600 mg by mouth 2 (two) times daily.         Marland Kitchen HYDROmorphone (DILAUDID) 2 MG tablet   Oral   Take 2 mg by mouth every 4 (four) hours as needed for moderate pain.          Marland Kitchen ibuprofen (ADVIL,MOTRIN) 800 MG tablet   Oral   Take 800 mg by mouth every 8 (eight) hours as needed.         Marland Kitchen levothyroxine (SYNTHROID, LEVOTHROID) 125 MCG tablet   Oral   Take 125 mcg by mouth daily.          . Lidocaine 0.5 % GEL   Apply externally   Apply 1 application topically 2 (two) times daily. Right knee         . losartan-hydrochlorothiazide (HYZAAR) 100-25 MG per tablet   Oral   Take 1 tablet by mouth daily.          . meloxicam (MOBIC) 15 MG tablet   Oral   Take 1 tablet (15 mg total) by mouth daily.   30 tablet   3   . metFORMIN (GLUCOPHAGE) 1000 MG tablet   Oral   Take 1,000 mg by mouth 2 (two) times daily with a meal.         . methylphenidate (RITALIN) 20 MG tablet   Oral   Take 20 mg by mouth 2 (two) times daily with breakfast and lunch.          . Multiple Vitamin (MULTIVITAMIN) tablet   Oral   Take 1 tablet by mouth daily.         Marland Kitchen omeprazole (PRILOSEC) 40 MG capsule   Oral   Take 40 mg by mouth daily as needed (acid reflex).          . Oxycodone HCl 10 MG TABS   Oral   Take 10 mg by mouth every 4 (four) hours as needed (for pain).         Marland Kitchen  rosuvastatin (CRESTOR) 20 MG tablet   Oral   Take 20 mg by mouth daily.         Marland Kitchen ZETIA 10 MG tablet   Oral   Take 10 mg by mouth daily.           Triage Vitals: BP 144/71  Pulse 79  Temp(Src) 98.4 F (36.9 C) (Oral)  Resp 18  Ht 5\' 5"  (1.651 m)  Wt 300 lb (136.079 kg)  BMI 49.92 kg/m2  SpO2 98% Physical Exam  Nursing note and vitals reviewed. Constitutional: She is oriented to person, place, and time. She appears well-developed and well-nourished.  HENT:  Head: Normocephalic and atraumatic.  Eyes: Conjunctivae  and EOM are normal. Right eye exhibits no discharge. Left eye exhibits no discharge.  Neck: Normal range of motion. Neck supple.  Cardiovascular: Normal rate, regular rhythm and normal heart sounds.  Exam reveals no friction rub.   No murmur heard. Pulses:      Radial pulses are 2+ on the right side, and 2+ on the left side.       Dorsalis pedis pulses are 2+ on the right side, and 2+ on the left side.  Pulmonary/Chest: Effort normal and breath sounds normal. No respiratory distress. She has no wheezes. She has no rales.  Abdominal:  Obese   Musculoskeletal: She exhibits tenderness.  Swelling identified to the right knee circumferentially. Healed over scars from previous surgery identified. Discomfort upon palpation to the anterior and medial aspect of the right knee. Decreased range of motion to flexion and extension secondary to pain. Full range of motion to right lower extremity.  Neurological: She is alert and oriented to person, place, and time. No cranial nerve deficit. She exhibits normal muscle tone. Coordination normal.  Cranial nerves III-XII grossly intact Strength 5+/5+ to lower extremities bilaterally with resistance applied, equal distribution noted Strength intact to the digits of the feet bilaterally Sensation intact with differentiation to sharp and dull touch to right lower extremity  Skin: Skin is warm and dry. No rash noted. No erythema.  Psychiatric: She has a normal mood and affect. Her behavior is normal.    ED Course  Procedures (including critical care time) DIAGNOSTIC STUDIES: Oxygen Saturation is 98% on RA, normal by my interpretation.   COORDINATION OF CARE: 11:30 AM- Informed pt of the results of her X-Ray. Will administer Oxycodone for pain. Pt verbalizes understanding and agrees to plan.  11:47 AM This provider spoke with Dr. Erlinda Hong, Orthopedic. Discussed case, history, imaging in great detail. As per physician recommended patient to be treated as an acute  fracture and to followup as outpatient with Dr. Sharol Given.  Dg Knee Complete 4 Views Right  08/22/2013   CLINICAL DATA Right knee pain.  EXAM RIGHT KNEE - COMPLETE 4+ VIEW  COMPARISON 04/13/2013  FINDINGS Total knee replacement with revision. There is cement around the tibial stem. There is some cortical lucency in the medial aspect of the proximal tibia below the prosthesis which is similar to the prior study and may be due to healing fracture or postsurgical change. No definite acute fracture. No loosening or displacement of the prosthesis. There is a joint effusion.  IMPRESSION No change from prior studies.  SIGNATURE  Electronically Signed   By: Franchot Gallo M.D.   On: 08/22/2013 10:40    Medications  oxyCODONE-acetaminophen (PERCOCET/ROXICET) 5-325 MG per tablet 1 tablet (1 tablet Oral Given 08/22/13 1150)     Labs Review Labs Reviewed - No data to  display Imaging Review Dg Knee Complete 4 Views Right  08/22/2013   CLINICAL DATA Right knee pain.  EXAM RIGHT KNEE - COMPLETE 4+ VIEW  COMPARISON 04/13/2013  FINDINGS Total knee replacement with revision. There is cement around the tibial stem. There is some cortical lucency in the medial aspect of the proximal tibia below the prosthesis which is similar to the prior study and may be due to healing fracture or postsurgical change. No definite acute fracture. No loosening or displacement of the prosthesis. There is a joint effusion.  IMPRESSION No change from prior studies.  SIGNATURE  Electronically Signed   By: Franchot Gallo M.D.   On: 08/22/2013 10:40     EKG Interpretation None      MDM   Final diagnoses:  Right knee pain   Medications  oxyCODONE-acetaminophen (PERCOCET/ROXICET) 5-325 MG per tablet 1 tablet (1 tablet Oral Given 08/22/13 1150)   Filed Vitals:   08/22/13 0953 08/22/13 1322  BP: 144/71 142/80  Pulse: 79 88  Temp: 98.4 F (36.9 C)   TempSrc: Oral   Resp: 18 18  Height: 5\' 5"  (1.651 m)   Weight: 300 lb (136.079 kg)    SpO2: 98% 98%    I personally performed the services described in this documentation, which was scribed in my presence. The recorded information has been reviewed and is accurate.  Patient presenting to the ED with right knee pain-patient has history of 2 knee replacements in the right knee one in 2010 and the other in January 2014. Patient reported that she fell on ice and landed directly on her right knee last week. Reported that she's been having swelling, throbbing sensations are constant with in the right knee. Stated that she normally takes oxycodone, dilaudid, ibuprofen. Stated that she took ibuprofen this morning with minimal relief-denied taking oxycodone or dilaudid.  This provider reviewed the patient's chart. Patient was seen here in the ED setting on 03/26/2013 regarding right knee pain - patient was given Dilaudid IM and percocets PO and discharged home.  Alert and oriented. GCS 15. Heart rate and rhythm normal. Lungs clear to auscultation to upper lower lobes bilaterally. Radial and DP pulses 2+ bilaterally. Cap refill less than 3 seconds. Swelling identified to the right knee circumferentially with discomfort upon palpation to the anterior and medial aspect. Decreased range of motion to the right knee secondary to pain. Strength intact to lower extremities with equal distribution. Sensation intact with differentiation to sharp and dull touch. Plain film of right knee cannot exclude a possible acute fracture. This provider reviewed previous imaging from October 2014 with negative finding of possible acute fracture. This provider spoke with on-call orthopedic physician - reviewed over plain films were performed today-  who recommended to treat patient as a acute fracture. Discussed to followup with Dr. Sharol Given as outpatient. Cannot exclude a possible acute fracture-patient placed in knee immobilizer. Pain controlled in ED setting. Patient neurovascularly intact. Patient stable, afebrile.  Discharged patient. Referred patient to orthopedics - Dr. Sharol Given. Patient reported that she has pain medications for home. Discussed with patient to rest, ice, elevate. Discussed with patient to keep knee immobilizer on at all times. Discussed with patient to avoid any weightbearing exercises. Discussed with patient to continue to take medications at home as prescribed for pain control. Discussed with patient to closely monitor symptoms and if symptoms are to worsen or change to report back to the ED - strict return instructions given.  Patient agreed to plan of care, understood,  all questions answered.   Jamse Mead, PA-C 08/22/13 1734

## 2013-08-22 NOTE — Discharge Instructions (Signed)
Please call your doctor for a followup appointment within 24-48 hours. When you talk to your doctor please let them know that you were seen in the emergency department and have them acquire all of your records so that they can discuss the findings with you and formulate a treatment plan to fully care for your new and ongoing problems. Please call Dr. Sharol Given and set-up an appointment to be reassessed Please keep knee immobilizer on at all times Please avoid applying weight to the right leg Please rest and stay hydrated Please rest ice and elevate Please continue to monitor symptoms closely and if symptoms are to worsen or change (fever greater than 101, chills, sweating, numbness, tingling, fall, injury, weakness) please report back to the ED immediately    Arthralgia Arthralgia is joint pain. A joint is a place where two bones meet. Joint pain can happen for many reasons. The joint can be bruised, stiff, infected, or weak from aging. Pain usually goes away after resting and taking medicine for soreness.  HOME CARE  Rest the joint as told by your doctor.  Keep the sore joint raised (elevated) for the first 24 hours.  Put ice on the joint area.  Put ice in a plastic bag.  Place a towel between your skin and the bag.  Leave the ice on for 15-20 minutes, 03-04 times a day.  Wear your splint, casting, elastic bandage, or sling as told by your doctor.  Only take medicine as told by your doctor. Do not take aspirin.  Use crutches as told by your doctor. Do not put weight on the joint until told to by your doctor. GET HELP RIGHT AWAY IF:   You have bruising, puffiness (swelling), or more pain.  Your fingers or toes turn blue or start to lose feeling (numb).  Your medicine does not lessen the pain.  Your pain becomes severe.  You have a temperature by mouth above 102 F (38.9 C), not controlled by medicine.  You cannot move or use the joint. MAKE SURE YOU:   Understand these  instructions.  Will watch your condition.  Will get help right away if you are not doing well or get worse. Document Released: 05/19/2009 Document Revised: 08/23/2011 Document Reviewed: 05/19/2009 [facility] Patient Information 2014 [place], [place].   Emergency Department Resource Guide 1) Find a Doctor and Pay Out of Pocket Although you won't have to find out who is covered by your insurance plan, it is a good idea to ask around and get recommendations. You will then need to call the office and see if the doctor you have chosen will accept you as a new patient and what types of options they offer for patients who are self-pay. Some doctors offer discounts or will set up payment plans for their patients who do not have insurance, but you will need to ask so you aren't surprised when you get to your appointment.  2) Contact Your Local Health Department Not all health departments have doctors that can see patients for sick visits, but many do, so it is worth a call to see if yours does. If you don't know where your local health department is, you can check in your phone book. The CDC also has a tool to help you locate your state's health department, and many state websites also have listings of all of their local health departments.  3) Find a [facility] If your illness is not likely to be very severe or complicated, you may  want to try a walk in clinic. These are popping up all over the country in pharmacies, drugstores, and shopping centers. They're usually staffed by nurse practitioners or physician assistants that have been trained to treat common illnesses and complaints. They're usually fairly quick and inexpensive. However, if you have serious medical issues or chronic medical problems, these are probably not your best option.  No Primary Care Doctor: - Call Health Connect at  [phone] - they can help you locate a primary care doctor that  accepts your insurance, provides certain services,  etc. - Physician Referral Service- [phone]  Chronic Pain Problems: Organization         Address  Phone   Notes  [facility]  [phone] Patients need to be referred by their primary care doctor.   Medication Assistance: Organization         Address  Phone   Notes  [facility] Medication [facility] [place] [place], [place] [address] [phone] --Must be a resident of [facility] -- Must have NO insurance coverage whatsoever (no Medicaid/ Medicare, etc.) -- The pt. MUST have a primary care doctor that directs their care regularly and follows them in the community   MedAssist  [phone]   [employer]  [phone]    Agencies that provide inexpensive medical care: Organization         Address  Phone   Notes  [place]  [phone]   [name]    [phone]   [facility] [place], [place] [address] [phone]   [place] [address] [place] [phone]   Planned Parenthood    [phone]   [facility]    [phone]   [place] and [address], [place] Phone:  [phone], Fax:  [phone] Hours of Operation:  9 am - 6 pm, M-F.  Also accepts Medicaid/Medicare and self-pay.  [facility] for [place], [address], [place] Phone: [phone], Fax: [phone]. Hours of Operation:  8:30 am - 5:30 pm, M-F.  Also accepts Medicaid and self-pay.  [facility] [address], [place] Phone: [phone]   [place], [place], [place] [phone], Ext. [phone] Mondays & Thursdays: 7-9 AM.  First 15 patients are seen on a first come, first serve basis.    [place] Providers:  Organization         Address  Phone   Notes  [facility] [address],  [phone] Also accepts self-pay patients.  [facility] [address], [place]  [phone]   [place], [address], [place] [phone]   [facility] [address] [place] [phone]   Lucianne Lei [address], [place]   [phone] Only accepts [place] Access [place] patients after they have their name applied to their card.   Self-Pay (no insurance) in [facility]:  Organization         Address  Phone   Notes  Sickle Cell Patients, [facility] [place] [phone]   [facility] [place] [phone])  [facility]  [address] [place], [address], [place] [phone]   [facility]/Dr. Osei-Bonsu  [address] [place] or [address], [place] [phone] Phone number for both [place] and [place] locations is the same.  Urgent Medical and [facility] [address] [place] [phone]   [facility] [address], [place] or [address] [phone]   [facility] [address] [place] [phone], phone; [phone], fax Sees patients 1st and 3rd Saturday of every month.  Must not qualify for public or private insurance (i.e. Medicaid, Medicare, [place] Health Choice, Veterans' Benefits)  Household income should be no more than 200% of the poverty level The clinic cannot treat you if you are pregnant or think you are pregnant  Sexually transmitted diseases are not treated at the clinic.    Dental Care: Organization         Address  Phone  Notes  [facility] Department of [facility] [phone] Accepts children up to  age 42 who are enrolled in [place] or [place]; pregnant women with a Medicaid card; and children who have applied for Medicaid or [place] Health Choice, but were declined, whose parents can pay a reduced fee at time of service.  [facility] Department of [facility]  [address], [place] [phone] Accepts children up to age 60 who are enrolled in [place] or [place]; pregnant women with a Medicaid card; and children who have applied for Medicaid or  Health Choice, but were declined, whose parents can pay a reduced fee at time of service.  [facility] Access PROGRAM  [place] [phone] Patients are seen by appointment only. Walk-ins are not accepted. [place] will see patients 53 years of age and older. Monday - Tuesday (8am-5pm) Most Wednesdays (8:30-5pm) $30 per visit, cash only  [facility] Adult Dental Access PROGRAM  [address], [facility] [phone] Patients are seen by appointment only. Walk-ins are not accepted. [place] will see patients 43 years of age and older. One Wednesday Evening (Monthly: Volunteer Based).  $30 per visit, cash only  [place]  [phone] for adults; Children under age 80, call Graduate Pediatric Dentistry at [phone]. Children aged 11-14, please call [phone] to request a pediatric application.  Dental services are provided in all areas of dental care including fillings, crowns and bridges, complete and partial dentures, implants, gum treatment, root canals, and extractions. Preventive care is also provided. Treatment is provided to both adults and children. Patients are selected via a lottery and there is often a waiting list.   [facility] [address] [place]  [phone] [email or web address]   [facility] [address] [place], [place] [phone], Ext. [phone] Second and Fourth Thursday of  each month, opens at 6:30 AM; Clinic ends at 9 AM.  Patients are seen on a first-come first-served basis, and a limited number are seen during each clinic.   [facility]  [address] Hillard Danker [place], [place] [phone]   Eligibility Requirements You must have lived in [place], [place], or [place] counties for at least the last three months.   You cannot be eligible for state  or Museum/gallery conservator, including [employer], [place], or [employer].   You generally cannot be eligible for healthcare insurance through your employer.    How to apply: Eligibility screenings are held every Tuesday and Wednesday afternoon from 1:00 pm until 4:00 pm. You do not need an appointment for the interview!  [facility] [address] [place], [place]   [place]  [place] Department  [place]  [phone]    Behavioral Health Resources in the Community: Intensive Outpatient Programs Organization         Address  Phone  Notes  [place]. [address], [place], [place] [phone]   [facility] Outpatient [address], [place], [place]   ADS: Alcohol & Drug Svcs [address] [place], [place]   [place] [address]  [place], [place] or [phone]   Substance Abuse Resources Organization         Address  Phone  Notes  Al[facility] Drug Services  [phone]   [place]  [phone]   [facility] [place]   Chinita Pester  [phone]   Residential & Outpatient Substance Abuse Program  [phone]   Psychological Services Organization         Address  Phone  Notes  [facility] [place]  [place]  [phone]   [place] [address],  [place] or [phone]    Mobile Crisis Teams Organization         Address  Phone  Notes  [facility], Mobile Crisis Care Unit  [phone]   [facility] Psychotherapeutic Services  [address] [place], [place]   [place] [address] [place] [phone]    Self-Help/Support Groups Organization         Address  Phone             Notes  [place]. of [place] - variety of support groups  [place] Call for more information  Narcotics Anonymous (NA), Caring Services [address], [employer] [place]  2 meetings at this location   Special educational needs teacher         Address  Phone  Notes  [facility] [place],    [place]  [phone]   [facility]  [address] [place] [phone], [place], [place]   [place], [place] [phone] Admissions: 8am-3pm M-F  Incentives Substance [place] [address]    [place], [place] [phone]   [facility] [address] Jadene Pierini [place], [place]   [facility] [address]  [place], [place]   [facility] Programs - Intensive Outpatient [address], [place], [place]   [facility] [place].,  [place], [place] [phone] or [phone]   [facility]idential Treatment Services (RTS) [address], [place], [place] Accepts Medicaid  [facility] [place] [address],  [place] [phone] Substance Abuse/Addiction Treatment   [facility] Organization         Address  Phone  Notes  [facility]  [phone]   Domenic Schwab, PhD [place], [address], [place]   [phone]  [phone] or [phone]) [phone]   [facility]   [address] [place], [place] [phone]     [address], [place], [place] [phone] Insurance/Medicaid/sponsorship through [facility] and Families [address], [address] [place]                                    [place], [place] [phone] [place] [address]   [place], [place] [phone]    Dr. Adele Schilder  [phone]   [facility] [place] Dept. 1) [address], [place] 2) [place] 3)  [address], [place] [phone]  [phone]   [place] [phone] or [phone] (After Hours)

## 2013-08-22 NOTE — ED Notes (Signed)
Patient states in physical therapy after 2 x knee replacement.  Patient in physical therapy.   Patient has many different pain medications at home, but claims she only took ibuprofen this morning.   Patient states with no relief.   Patient fell [place] [employer] bus in October and has been doing therapy on knee since.   Patient did have mechanical fall at about a week ago, but claims the pain just recently got really bad and she had to sit down on steps this morning and could not put any weight on the leg.

## 2013-08-22 NOTE — Progress Notes (Signed)
Orthopedic Tech Progress Note Patient Details:  Caroline Williams January 17, 1961 [id number]  Ortho Devices Type of Ortho Device: Knee Immobilizer Ortho Device/Splint Interventions: Application   Cammer, Theodoro Parma 08/22/2013, 1:16 PM

## 2013-08-24 NOTE — ED Provider Notes (Signed)
Medical screening examination/treatment/procedure(s) were performed by non-physician practitioner and as supervising physician I was immediately available for consultation/collaboration.   EKG Interpretation None       Richarda Blade, MD 08/24/13 1059

## 2013-09-03 ENCOUNTER — Telehealth: Payer: Self-pay

## 2013-09-03 NOTE — Telephone Encounter (Signed)
Pt hasn't seen [place] since January. She should follow up with Dr. Sharol Given if she damaged "muscles or tendons"

## 2013-09-03 NOTE — Telephone Encounter (Signed)
Patient called to let us know that she had fallen.  Per Dr Sharol Given she thinks she has damaged a muscle or tendons.

## 2013-09-04 NOTE — Telephone Encounter (Signed)
Left patient a message informing her that Dr. Naaman Plummer recommends following up with Dr. Sharol Given. If she has any further questions to contact the clinic.

## 2013-09-05 ENCOUNTER — Encounter: Payer: Medicare HMO | Attending: Physical Medicine & Rehabilitation | Admitting: Physical Medicine & Rehabilitation

## 2013-09-05 ENCOUNTER — Encounter: Payer: Self-pay | Admitting: Physical Medicine & Rehabilitation

## 2013-09-05 VITALS — BP 176/110 | HR 91 | Resp 14 | Ht 65.0 in | Wt 310.0 lb

## 2013-09-05 DIAGNOSIS — M1711 Unilateral primary osteoarthritis, right knee: Secondary | ICD-10-CM | POA: Insufficient documentation

## 2013-09-05 DIAGNOSIS — IMO0002 Reserved for concepts with insufficient information to code with codable children: Secondary | ICD-10-CM | POA: Insufficient documentation

## 2013-09-05 DIAGNOSIS — M1710 Unilateral primary osteoarthritis, unspecified knee: Secondary | ICD-10-CM | POA: Insufficient documentation

## 2013-09-05 DIAGNOSIS — M25569 Pain in unspecified knee: Secondary | ICD-10-CM

## 2013-09-05 DIAGNOSIS — M25561 Pain in right knee: Secondary | ICD-10-CM

## 2013-09-05 DIAGNOSIS — G8929 Other chronic pain: Secondary | ICD-10-CM | POA: Insufficient documentation

## 2013-09-05 MED ORDER — MORPHINE SULFATE ER 15 MG PO TBCR: 15.0000 mg | EXTENDED_RELEASE_TABLET | Freq: Two times a day (BID) | ORAL | Status: DC

## 2013-09-05 MED ORDER — OXYCODONE HCL 10 MG PO TABS: 10.0000 mg | ORAL_TABLET | Freq: Four times a day (QID) | ORAL | Status: DC | PRN

## 2013-09-05 NOTE — Patient Instructions (Signed)
HOLD OFF ON PT FOR NOW UNTIL WE GET YOUR CT RESULTS

## 2013-09-05 NOTE — Progress Notes (Signed)
Subjective:    Patient ID: Caroline Williams, female    DOB: 1960-08-07, 53 y.o.   MRN: [id number]  HPI  Mrs. Westley is back regarding her chronic right knee pain. She saw dr. Sharol Given about the prosthesis who found nothing structurally wrong with the prosthesis. He said it might be "muscles and tendons". She fell last week after leg gave away. She will see Dr. Sharol Given back next week in follow up. There is a small linerar lucency along the medial tibia noted on xray.   She has seen a nutritionist to help with diet. She may be a candidate for gastric bypass. She will need to lose some weight first. She is in PT to work on leg strengthening and muscle imbalance.   She is still taking oxycodone for breakthrough. She was given some dilaudid for breakthrough pain by Dr. Chapman Fitch.   Pain Inventory Average Pain 7 Pain Right Now 7 My pain is stabbing and aching  In the last 24 hours, has pain interfered with the following? General activity 9 Relation with others 9 Enjoyment of life 9 What TIME of day is your pain at its worst? morning, daytime Sleep (in general) Fair  Pain is worse with: walking, sitting, inactivity and standing Pain improves with: rest, heat/ice, TENS and other Relief from Meds: 5  Mobility walk with assistance use a cane use a walker how many minutes can you walk? 5 ability to climb steps?  no do you drive?  yes use a wheelchair Do you have any goals in this area?  yes  Function disabled: date disabled 02/1995 I need assistance with the following:  dressing, bathing, meal prep, household duties and shopping Do you have any goals in this area?  yes  Neuro/Psych bladder control problems numbness tingling depression anxiety  Prior Studies Any changes since last visit?  yes x-rays  Physicians involved in your care Any changes since last visit?  yes Primary care Dr. Chapman Fitch Orthopedist Dr. Sharol Given Psychologist Dr. Hubbard Hartshorn   Family History  Problem Relation Age of Onset   . Cancer Father     colon  . Stroke Father   . Heart disease Father   . Depression Sister    History   Social History  . Marital Status: Single    Spouse Name: N/A    Number of Children: N/A  . Years of Education: N/A   Social History Main Topics  . Smoking status: Current Some Day Smoker -- 0.50 packs/day for 4 years    Types: Cigarettes    Last Attempt to Quit: 11/13/2010  . Smokeless tobacco: Never Used  . Alcohol Use: No  . Drug Use: No  . Sexual Activity: Yes    Birth Control/ Protection: None   Other Topics Concern  . None   Social History Narrative  . None   Past Surgical History  Procedure Laterality Date  . Cesarean section    . Breast lumpectomy      right  . Hernia repair    . Appendectomy    . Abdominal hysterectomy      partial  . Knee surgery      right  . Myomectomy abdominal approach  1989  . Myomectomy abdominal approach  1989  . Cardiac catheterization      no significant CAD, nl LV function by 11/08/06 cath  . Total knee revision  06/21/2012    Procedure: TOTAL KNEE REVISION;  Surgeon: Newt Minion, MD;  Location: [facility];  Service:  Orthopedics;  Laterality: Right;  Revision Right Total Knee Arthroplasty   Past Medical History  Diagnosis Date  . History of knee replacement, total   . Fever   . Breast lump   . Breast discharge   . Breast pain   . Diabetes mellitus   . Thyroid disease   . Asthma   . Hiatal hernia   . Arthritis   . Depression   . Hypertension   . Bipolar 1 disorder   . Hypothyroidism   . Sleep apnea    BP 176/110  Pulse 91  Resp 14  Ht 5\' 5"  (1.651 m)  Wt 310 lb (140.615 kg)  BMI 51.59 kg/m2  SpO2 96%  Opioid Risk Score:   Fall Risk Score: High Fall Risk (>13 points) (pt educated and given a brochure on fall risk)    Review of Systems  Constitutional: Positive for diaphoresis and unexpected weight change.  Respiratory: Positive for apnea, shortness of breath and wheezing.   Gastrointestinal: Positive for  nausea.  Genitourinary:       Bladder control problems  Musculoskeletal: Positive for back pain.  Neurological: Positive for numbness.       Tingling  Psychiatric/Behavioral: Positive for dysphoric mood. The patient is nervous/anxious.   All other systems reviewed and are negative.       Objective:   Physical Exam  General: Alert and oriented x 3, No apparent distress. Morbidly obese  HEENT: Head is normocephalic, atraumatic, PERRLA, EOMI, sclera anicteric, oral mucosa pink and moist, dentition intact, ext ear canals clear,  Neck: Supple without JVD or lymphadenopathy  Heart: Reg rate and rhythm. No murmurs rubs or gallops  Chest: CTA bilaterally without wheezes, rales, or rhonchi; no distress  Abdomen: Soft, non-tender, non-distended, bowel sounds positive.  Extremities: No clubbing, cyanosis, or edema. Pulses are 2+  Skin: Clean and intact without signs of breakdown. tka scar noted  Neuro: Pt is cognitively appropriate with normal insight, memory, and awareness. Cranial nerves 2-12 are intact. Sensory exam is normal except for impaired light touch over distal legs. Reflexes are 2+ in all 4's. Fine motor coordination is intact. No tremors. Motor function is grossly 5/5.  Musculoskeletal: has 90 degrees flexion right knee but not beyond. Extension is almost full but has difficulty extending against gravity. There is no frank knee instability nor crepitus. She has pain with palpation over the medial tibial component of the prosthesis persists. Minimal pain over the pain over the patella, lateral knee and femur. She has continued severe antalgia with weight bearing.  Psych: Pt's affect is appropriate. Pt is cooperative. She is quite pleasant.   Assessment & Plan:   1. Right knee pain, hx of OA and hx of knee replacement with revision --medial tibial lucency on recent xray 2. Morbid obesity  3. DM2 with neuropathy    Plan:  1. Begin long acting agent ms contin 40mc q12. Oxycodone 10mg   q6 prn for breakthrough pain. 2. Ordered CT of knee to assess lucency..  3. Meloxicam 15mg  po daily was added  4. Continue with topical treatments which are appropriate.  5. HEP for now until we see what CT reveals 6. Continue with nutritional efforts.  7. Follow up in 1 month. 30 minutes of face to face patient care time were spent during this visit. All questions were encouraged and answered.

## 2013-09-12 ENCOUNTER — Telehealth: Payer: Self-pay

## 2013-09-12 NOTE — Telephone Encounter (Signed)
Erroneous encouter

## 2013-09-13 ENCOUNTER — Ambulatory Visit: Payer: Medicare HMO | Admitting: Dietician

## 2013-09-18 ENCOUNTER — Ambulatory Visit (HOSPITAL_COMMUNITY): Payer: Medicare HMO

## 2013-09-21 ENCOUNTER — Telehealth: Payer: Self-pay

## 2013-09-21 NOTE — Telephone Encounter (Signed)
Patient called and said she was unable to have CT.  She is currently immoble.  She would like a call.  She is concerned that Dr Sharol Given and Dr Naaman Plummer notes are not being review by one another.

## 2013-09-21 NOTE — Telephone Encounter (Signed)
She does not have transportation and when they called to schedeule the CT scan they wanted her to come today and she has to have time to set up public transportation.  I have given her the number to [place] [facility] sop that she can address that with them.  She also is concerned bout the difference in Dr Naaman Plummer and Dr Jess Barters opinions about what is going on her.  I explained that Dr Sharol Given should have access to the EPIC since he does surgery and sees patients in the [facility] system but we do not have access to their computerized records.  I suggested she request Dr Jess Barters office notes be sent to Dr Naaman Plummer.  She will request that be done.  Dr Sharol Given has put her back in her wheelchair.

## 2013-09-26 ENCOUNTER — Encounter (HOSPITAL_COMMUNITY): Payer: Self-pay

## 2013-09-26 ENCOUNTER — Other Ambulatory Visit (HOSPITAL_COMMUNITY): Payer: Medicare HMO

## 2013-09-26 ENCOUNTER — Ambulatory Visit (HOSPITAL_COMMUNITY)
Admission: RE | Admit: 2013-09-26 | Discharge: 2013-09-26 | Disposition: A | Discharge status: Home / Self Care | Payer: Medicare HMO | Source: Ambulatory Visit | Attending: Physical Medicine & Rehabilitation | Admitting: Physical Medicine & Rehabilitation

## 2013-09-26 ENCOUNTER — Telehealth: Payer: Self-pay | Admitting: Physical Medicine & Rehabilitation

## 2013-09-26 DIAGNOSIS — M948X9 Other specified disorders of cartilage, unspecified sites: Secondary | ICD-10-CM | POA: Insufficient documentation

## 2013-09-26 DIAGNOSIS — Z96659 Presence of unspecified artificial knee joint: Secondary | ICD-10-CM | POA: Insufficient documentation

## 2013-09-26 DIAGNOSIS — M25569 Pain in unspecified knee: Secondary | ICD-10-CM | POA: Insufficient documentation

## 2013-09-26 DIAGNOSIS — M25561 Pain in right knee: Secondary | ICD-10-CM

## 2013-09-26 DIAGNOSIS — G8929 Other chronic pain: Secondary | ICD-10-CM

## 2013-09-26 DIAGNOSIS — M25469 Effusion, unspecified knee: Secondary | ICD-10-CM | POA: Diagnosis not present

## 2013-09-26 DIAGNOSIS — M1711 Unilateral primary osteoarthritis, right knee: Secondary | ICD-10-CM

## 2013-09-26 NOTE — Telephone Encounter (Signed)
CT of right knee reveals the following:    1. Prominent bone/cement interface surrounding the tibial prosthesis  consistent with chronic loosening. There is medial extension of this  interface into the medial tibial plateau accounting for the recent  radiographic findings. There is no evidence of cortical fracture in  this area.  2. Possible lesser loosening of the femoral prosthesis.  3. Of note, the peripheral aspects of the hardware are not imaged.  4. Moderate-sized joint effusion   I have reviewed the study as well.  She needs to go back to see Dr. Sharol Given to review

## 2013-09-27 NOTE — Telephone Encounter (Signed)
Left message for patient to call office regarding her CT and Dr Naaman Plummer recommendation that she go back to see Dr Sharol Given.

## 2013-10-01 NOTE — Telephone Encounter (Signed)
Left message advising patient to follow up CT with Dr Sharol Given.

## 2013-10-01 NOTE — Telephone Encounter (Signed)
Patient returned call to clinic. Would like someone to return her call.

## 2013-10-18 ENCOUNTER — Ambulatory Visit: Payer: Medicare HMO | Admitting: Registered Nurse

## 2013-10-24 ENCOUNTER — Encounter: Payer: Self-pay | Admitting: Registered Nurse

## 2013-10-24 ENCOUNTER — Encounter: Payer: Medicare HMO | Attending: Physical Medicine & Rehabilitation | Admitting: Registered Nurse

## 2013-10-24 VITALS — BP 141/65 | HR 81 | Resp 14 | Ht 65.0 in | Wt 315.8 lb

## 2013-10-24 DIAGNOSIS — M1710 Unilateral primary osteoarthritis, unspecified knee: Secondary | ICD-10-CM

## 2013-10-24 DIAGNOSIS — IMO0002 Reserved for concepts with insufficient information to code with codable children: Secondary | ICD-10-CM | POA: Diagnosis present

## 2013-10-24 DIAGNOSIS — M25569 Pain in unspecified knee: Secondary | ICD-10-CM | POA: Diagnosis not present

## 2013-10-24 DIAGNOSIS — G8929 Other chronic pain: Secondary | ICD-10-CM

## 2013-10-24 DIAGNOSIS — M25561 Pain in right knee: Secondary | ICD-10-CM

## 2013-10-24 DIAGNOSIS — M1711 Unilateral primary osteoarthritis, right knee: Secondary | ICD-10-CM

## 2013-10-24 DIAGNOSIS — Z79899 Other long term (current) drug therapy: Secondary | ICD-10-CM

## 2013-10-24 DIAGNOSIS — Z5181 Encounter for therapeutic drug level monitoring: Secondary | ICD-10-CM

## 2013-10-24 MED ORDER — MORPHINE SULFATE ER 15 MG PO TBCR: 15.0000 mg | EXTENDED_RELEASE_TABLET | Freq: Two times a day (BID) | ORAL | Status: DC

## 2013-10-24 MED ORDER — OXYCODONE HCL 10 MG PO TABS: 10.0000 mg | ORAL_TABLET | Freq: Four times a day (QID) | ORAL | Status: DC | PRN

## 2013-10-24 NOTE — Progress Notes (Signed)
Subjective:    Patient ID: Caroline Williams, female    DOB: April 28, 1961, 53 y.o.   MRN: [id number]  HPI: [name] is a 53 year old female who returns for follow up for chronic paina nd medication refill. She says her pain is located in her right knee. She rates her pain 6. Her current exercise regime is stretching and resistant exercises using the bands. She walks with a straight cane.  She had a CT scan of  Right knee/w/o contrast: 1. Prominent bone/cement interface surrounding the tibial prosthesis  consistent with chronic loosening. There is medial extension of this  interface into the medial tibial plateau accounting for the recent  radiographic findings. There is no evidence of cortical fracture in  this area.  2. Possible lesser loosening of the femoral prosthesis.  3. Of note, the peripheral aspects of the hardware are not imaged.  4. Moderate-sized joint effusion.   Patient instructed to F/U with Dr. Sharol Williams and verbalized understanding.  Pain Inventory Average Pain 7 Pain Right Now 6 My pain is aching and throbbing  In the last 24 hours, has pain interfered with the following? General activity 9 Relation with others 10 Enjoyment of life 10 What TIME of day is your pain at its worst? morning and daytime, night Sleep (in general) Poor  Pain is worse with: walking, sitting and standing Pain improves with: heat/ice, medication and TENS Relief from Meds: 8  Mobility use a cane use a walker how many minutes can you walk? 5 ability to climb steps?  no do you drive?  no  Function disabled: date disabled . I need assistance with the following:  dressing, bathing, meal prep, household duties and shopping  Neuro/Psych trouble walking depression anxiety  Prior Studies Any changes since last visit?  yes  CT  Physicians involved in your care Any changes since last visit?  no   Family History  Problem Relation Age of Onset  . Cancer Father     colon  .  Stroke Father   . Heart disease Father   . Depression Sister    History   Social History  . Marital Status: Single    Spouse Name: N/A    Number of Children: N/A  . Years of Education: N/A   Social History Main Topics  . Smoking status: Current Some Day Smoker -- 0.50 packs/day for 4 years    Types: Cigarettes    Last Attempt to Quit: 11/13/2010  . Smokeless tobacco: Never Used  . Alcohol Use: No  . Drug Use: No  . Sexual Activity: Yes    Birth Control/ Protection: None   Other Topics Concern  . None   Social History Narrative  . None   Past Surgical History  Procedure Laterality Date  . Cesarean section    . Breast lumpectomy      right  . Hernia repair    . Appendectomy    . Abdominal hysterectomy      partial  . Knee surgery      right  . Myomectomy abdominal approach  1989  . Myomectomy abdominal approach  1989  . Cardiac catheterization      no significant CAD, nl LV function by 11/08/06 cath  . Total knee revision  06/21/2012    Procedure: TOTAL KNEE REVISION;  Surgeon: Newt Minion, MD;  Location: [facility];  Service: Orthopedics;  Laterality: Right;  Revision Right Total Knee Arthroplasty   Past Medical History  Diagnosis  Date  . History of knee replacement, total   . Fever   . Breast lump   . Breast discharge   . Breast pain   . Diabetes mellitus   . Thyroid disease   . Asthma   . Hiatal hernia   . Arthritis   . Depression   . Hypertension   . Bipolar 1 disorder   . Hypothyroidism   . Sleep apnea    BP 141/65  Pulse 81  Resp 14  Ht 5\' 5"  (1.651 m)  Wt 315 lb 12.8 oz (143.246 kg)  BMI 52.55 kg/m2  SpO2 95%  Opioid Risk Score:   Fall Risk Score: Moderate Fall Risk (6-13 points) (educated and handout Williams at previous visit for fall prevention in the home)  Review of Systems  Constitutional: Positive for unexpected weight change.  Respiratory: Positive for apnea and wheezing.   Gastrointestinal: Positive for constipation.    Musculoskeletal: Positive for gait problem.  Psychiatric/Behavioral: Positive for dysphoric mood. The patient is nervous/anxious.   All other systems reviewed and are negative.      Objective:   Physical Exam  Nursing note and vitals reviewed. Constitutional: She is oriented to person, place, and time. She appears well-developed and well-nourished.  Morbid Obesity  HENT:  Head: Normocephalic and atraumatic.  Neck: Normal range of motion. Neck supple.  Cardiovascular: Normal rate, regular rhythm and normal heart sounds.   Pulmonary/Chest: Effort normal and breath sounds normal.  Musculoskeletal:  Normal Muscle Bulk: Muscle Testing Reveals. Upper Extremities: Full ROM and Muscle Strength 5/5. Back without spinal or paraspinal tenderness. Lower Extremities: Right Knee: Decreased ROM with Flexion and Produces Pain into patella./ Tenderness Noted. Knee Brace intact Left Knee Full ROM Arises from Chair with some difficulty Circumduction gait Noted/ Uses a Straight Cane  Neurological: She is alert and oriented to person, place, and time.  Skin: Skin is warm and dry.  Psychiatric: She has a normal mood and affect.          Assessment & Plan:  1. Right Knee Pain, History of OA and History of knee replacement with revision: S/P CT Scan Right Knee: F/U Dr. Sharol Williams. Refilled: MS CONTIN 15 mg one tablet every 12 hours #60 and Oxycodone 10 mg one tablet every 6 hours as needed for pain #120 2. Morbid Obesity:Going to [facility]al and Diabetic Classes: Continue to Work on [employer] and weight loss. 3. DM2 with neuropathy: Continue with Voltaren gel and gabapentin  20 minutes of face to face patient care time was spent during this visit. All questions were encouraged and answered.  F/U in 1 month

## 2013-11-06 ENCOUNTER — Encounter (HOSPITAL_COMMUNITY): Payer: Self-pay | Admitting: Emergency Medicine

## 2013-11-06 ENCOUNTER — Emergency Department (HOSPITAL_COMMUNITY)
Admission: EM | Admit: 2013-11-06 | Discharge: 2013-11-06 | Disposition: A | Discharge status: Home / Self Care | Payer: Medicare HMO | Attending: Emergency Medicine | Admitting: Emergency Medicine

## 2013-11-06 DIAGNOSIS — E039 Hypothyroidism, unspecified: Secondary | ICD-10-CM | POA: Insufficient documentation

## 2013-11-06 DIAGNOSIS — F319 Bipolar disorder, unspecified: Secondary | ICD-10-CM | POA: Insufficient documentation

## 2013-11-06 DIAGNOSIS — Z9889 Other specified postprocedural states: Secondary | ICD-10-CM | POA: Insufficient documentation

## 2013-11-06 DIAGNOSIS — Z8742 Personal history of other diseases of the female genital tract: Secondary | ICD-10-CM | POA: Insufficient documentation

## 2013-11-06 DIAGNOSIS — M25561 Pain in right knee: Secondary | ICD-10-CM

## 2013-11-06 DIAGNOSIS — Z791 Long term (current) use of non-steroidal anti-inflammatories (NSAID): Secondary | ICD-10-CM | POA: Insufficient documentation

## 2013-11-06 DIAGNOSIS — Z79899 Other long term (current) drug therapy: Secondary | ICD-10-CM | POA: Insufficient documentation

## 2013-11-06 DIAGNOSIS — Z96659 Presence of unspecified artificial knee joint: Secondary | ICD-10-CM | POA: Insufficient documentation

## 2013-11-06 DIAGNOSIS — G8929 Other chronic pain: Secondary | ICD-10-CM | POA: Insufficient documentation

## 2013-11-06 DIAGNOSIS — M549 Dorsalgia, unspecified: Secondary | ICD-10-CM | POA: Insufficient documentation

## 2013-11-06 DIAGNOSIS — M129 Arthropathy, unspecified: Secondary | ICD-10-CM | POA: Insufficient documentation

## 2013-11-06 DIAGNOSIS — E119 Type 2 diabetes mellitus without complications: Secondary | ICD-10-CM | POA: Insufficient documentation

## 2013-11-06 DIAGNOSIS — G4730 Sleep apnea, unspecified: Secondary | ICD-10-CM | POA: Insufficient documentation

## 2013-11-06 DIAGNOSIS — J45909 Unspecified asthma, uncomplicated: Secondary | ICD-10-CM | POA: Insufficient documentation

## 2013-11-06 DIAGNOSIS — R609 Edema, unspecified: Secondary | ICD-10-CM

## 2013-11-06 DIAGNOSIS — F17200 Nicotine dependence, unspecified, uncomplicated: Secondary | ICD-10-CM | POA: Insufficient documentation

## 2013-11-06 DIAGNOSIS — Z8719 Personal history of other diseases of the digestive system: Secondary | ICD-10-CM | POA: Insufficient documentation

## 2013-11-06 DIAGNOSIS — M25569 Pain in unspecified knee: Secondary | ICD-10-CM | POA: Insufficient documentation

## 2013-11-06 DIAGNOSIS — M79609 Pain in unspecified limb: Secondary | ICD-10-CM

## 2013-11-06 LAB — BASIC METABOLIC PANEL
BUN: 12 mg/dL (ref 6–23)
CO2: 27 mEq/L (ref 19–32)
Calcium: 9.8 mg/dL (ref 8.4–10.5)
Chloride: 103 mEq/L (ref 96–112)
Creatinine, Ser: 0.84 mg/dL (ref 0.50–1.10)
GFR calc Af Amer: >90 mL/min (ref >90)
GFR calc non Af Amer: 78 mL/min — ABNORMAL LOW (ref >90)
Glucose, Bld: 93 mg/dL (ref 70–99)
Potassium: 3.7 mEq/L (ref 3.7–5.3)
Sodium: 141 mEq/L (ref 137–147)

## 2013-11-06 LAB — CBC WITH DIFFERENTIAL/PLATELET
Basophils Absolute: 0 10*3/uL (ref 0.0–0.1)
Basophils Relative: 0 % (ref 0–1)
Eosinophils Absolute: 0.2 10*3/uL (ref 0.0–0.7)
Eosinophils Relative: 3 % (ref 0–5)
HCT: 37.9 % (ref 36.0–46.0)
Hemoglobin: 12.1 g/dL (ref 12.0–15.0)
Lymphocytes Relative: 28 % (ref 12–46)
Lymphs Abs: 1.8 10*3/uL (ref 0.7–4.0)
MCH: 25.6 pg — ABNORMAL LOW (ref 26.0–34.0)
MCHC: 31.9 g/dL (ref 30.0–36.0)
MCV: 80.3 fL (ref 78.0–100.0)
Monocytes Absolute: 0.3 10*3/uL (ref 0.1–1.0)
Monocytes Relative: 4 % (ref 3–12)
Neutro Abs: 4.1 10*3/uL (ref 1.7–7.7)
Neutrophils Relative %: 65 % (ref 43–77)
Platelets: 246 10*3/uL (ref 150–400)
RBC: 4.72 MIL/uL (ref 3.87–5.11)
RDW: 17.7 % — ABNORMAL HIGH (ref 11.5–15.5)
WBC: 6.4 10*3/uL (ref 4.0–10.5)

## 2013-11-06 MED ORDER — PREDNISONE 20 MG PO TABS: 40.0000 mg | ORAL_TABLET | Freq: Every day | ORAL | Status: DC

## 2013-11-06 MED ORDER — KETOROLAC TROMETHAMINE 60 MG/2ML IM SOLN
60.0000 mg | Freq: Once | INTRAMUSCULAR | Status: AC
  Administered 2013-11-06: 60 mg via INTRAMUSCULAR
  Filled 2013-11-06: qty 2

## 2013-11-06 MED ORDER — HYDROMORPHONE HCL PF 1 MG/ML IJ SOLN
1.0000 mg | Freq: Once | INTRAMUSCULAR | Status: AC
Start: 2013-11-06 — End: 2013-11-06
  Administered 2013-11-06: 1 mg via INTRAMUSCULAR
  Filled 2013-11-06: qty 1

## 2013-11-06 NOTE — ED Provider Notes (Signed)
Medical screening examination/treatment/procedure(s) were performed by non-physician practitioner and as supervising physician I was immediately available for consultation/collaboration.   EKG Interpretation None       Roger Fasnacht F Phil Corti, MD 11/06/13 1920 

## 2013-11-06 NOTE — Progress Notes (Signed)
*  Preliminary Results* Right lower extremity venous duplex completed. Right lower extremity is negative for deep vein thrombosis. There is no evidence of right Baker's cyst.  11/06/2013 3:12 PM  Maudry Mayhew, RVT, RDCS, RDMS

## 2013-11-06 NOTE — ED Notes (Addendum)
Called [phone], Caroline Williams, per patient request to advise patient here.   Voicemail answered, no message left.

## 2013-11-06 NOTE — Discharge Instructions (Signed)
Follow up with primary care doctor and your orthopedics spcialist. Keep your legs elevated. ACE wrap for swelling. Take prednisone for inflammation.   Chronic Pain Chronic pain can be defined as pain that is off and on and lasts for 3 6 months or longer. Many things cause chronic pain, which can make it difficult to make a diagnosis. There are many treatment options available for chronic pain. However, finding a treatment that works well for you may require trying various approaches until the right one is found. Many people benefit from a combination of two or more types of treatment to control their pain. SYMPTOMS  Chronic pain can occur anywhere in the body and can range from mild to very severe. Some types of chronic pain include:  Headache.  Low back pain.  Cancer pain.  Arthritis pain.  Neurogenic pain. This is pain resulting from damage to nerves. People with chronic pain may also have other symptoms such as:  Depression.  Anger.  Insomnia.  Anxiety. DIAGNOSIS  Your health care provider will help diagnose your condition over time. In many cases, the initial focus will be on excluding possible conditions that could be causing the pain. Depending on your symptoms, your health care provider may order tests to diagnose your condition. Some of these tests may include:   Blood tests.   CT scan.   MRI.   X-rays.   Ultrasounds.   Nerve conduction studies.  You may need to see a specialist.  TREATMENT  Finding treatment that works well may take time. You may be referred to a pain specialist. He or she may prescribe medicine or therapies, such as:   Mindful meditation or yoga.  Shots (injections) of numbing or pain-relieving medicines into the spine or area of pain.  Local electrical stimulation.  Acupuncture.   Massage therapy.   Aroma, color, light, or sound therapy.   Biofeedback.   Working with a physical therapist to keep from getting stiff.    Regular, gentle exercise.   Cognitive or behavioral therapy.   Group support.  Sometimes, surgery may be recommended.  HOME CARE INSTRUCTIONS   Take all medicines as directed by your health care provider.   Lessen stress in your life by relaxing and doing things such as listening to calming music.   Exercise or be active as directed by your health care provider.   Eat a healthy diet and include things such as vegetables, fruits, fish, and lean meats in your diet.   Keep all follow-up appointments with your health care provider.   Attend a support group with others suffering from chronic pain. SEEK MEDICAL CARE IF:   Your pain gets worse.   You develop a new pain that was not there before.   You cannot tolerate medicines given to you by your health care provider.   You have new symptoms since your last visit with your health care provider.  SEEK IMMEDIATE MEDICAL CARE IF:   You feel weak.   You have decreased sensation or numbness.   You lose control of bowel or bladder function.   Your pain suddenly gets much worse.   You develop shaking.  You develop chills.  You develop confusion.  You develop chest pain.  You develop shortness of breath.  MAKE SURE YOU:  Understand these instructions.  Will watch your condition.  Will get help right away if you are not doing well or get worse. Document Released: 02/20/2002 Document Revised: 01/31/2013 Document Reviewed: 11/24/2012 [employer] Patient  Information 2014 [place], [place].  Edema Edema is an abnormal build-up of fluids in tissues. Because this is partly dependent on gravity (water flows to the lowest place), it is more common in the legs and thighs (lower extremities). It is also common in the looser tissues, like around the eyes. Painless swelling of the feet and ankles is common and increases as a person ages. It may affect both legs and may include the calves or even thighs. When squeezed,  the fluid may move out of the affected area and may leave a dent for a few moments. CAUSES   Prolonged standing or sitting in one place for extended periods of time. Movement helps pump tissue fluid into the veins, and absence of movement prevents this, resulting in edema.  Varicose veins. The valves in the veins do not work as well as they should. This causes fluid to leak into the tissues.  Fluid and salt overload.  Injury, burn, or surgery to the leg, ankle, or foot, may damage veins and allow fluid to leak out.  Sunburn damages vessels. Leaky vessels allow fluid to go out into the sunburned tissues.  Allergies (from insect bites or stings, medications or chemicals) cause swelling by allowing vessels to become leaky.  Protein in the blood helps keep fluid in your vessels. Low protein, as in malnutrition, allows fluid to leak out.  Hormonal changes, including pregnancy and menstruation, cause fluid retention. This fluid may leak out of vessels and cause edema.  Medications that cause fluid retention. Examples are sex hormones, blood pressure medications, steroid treatment, or anti-depressants.  Some illnesses cause edema, especially heart failure, kidney disease, or liver disease.  Surgery that cuts veins or lymph nodes, such as surgery done for the heart or for breast cancer, may result in edema. DIAGNOSIS  Your caregiver is usually easily able to determine what is causing your swelling (edema) by simply asking what is wrong (getting a history) and examining you (doing a physical). Sometimes x-rays, EKG (electrocardiogram or heart tracing), and blood work may be done to evaluate for underlying medical illness. TREATMENT  General treatment includes:  Leg elevation (or elevation of the affected body part).  Restriction of fluid intake.  Prevention of fluid overload.  Compression of the affected body part. Compression with elastic bandages or support stockings squeezes the tissues,  preventing fluid from entering and forcing it back into the blood vessels.  Diuretics (also called water pills or fluid pills) pull fluid out of your body in the form of increased urination. These are effective in reducing the swelling, but can have side effects and must be used only under your caregiver's supervision. Diuretics are appropriate only for some types of edema. The specific treatment can be directed at any underlying causes discovered. Heart, liver, or kidney disease should be treated appropriately. HOME CARE INSTRUCTIONS   Elevate the legs (or affected body part) above the level of the heart, while lying down.  Avoid sitting or standing still for prolonged periods of time.  Avoid putting anything directly under the knees when lying down, and do not wear constricting clothing or garters on the upper legs.  Exercising the legs causes the fluid to work back into the veins and lymphatic channels. This may help the swelling go down.  The pressure applied by elastic bandages or support stockings can help reduce ankle swelling.  A low-salt diet may help reduce fluid retention and decrease the ankle swelling.  Take any medications exactly as prescribed. [place]  CARE IF:  Your edema is not responding to recommended treatments. SEEK IMMEDIATE MEDICAL CARE IF:   You develop shortness of breath or chest pain.  You cannot breathe when you lay down; or if, while lying down, you have to get up and go to the window to get your breath.  You are having increasing swelling without relief from treatment.  You develop a fever over 102 F (38.9 C).  You develop pain or redness in the areas that are swollen.  Tell your caregiver right away if you have gained 03 lb/1.4 kg in 1 day or 05 lb/2.3 kg in a week. MAKE SURE YOU:   Understand these instructions.  Will watch your condition.  Will get help right away if you are not doing well or get worse. Document Released: 05/31/2005  Document Revised: 11/30/2011 Document Reviewed: 01/17/2008 [facility] Patient Information 2014 [place].

## 2013-11-06 NOTE — ED Notes (Signed)
Per patient request, called [phone], Ladona Horns, to advise that patient wanted him to come to ED to be with her.   Also, [phone], Almyra Free and got no answer.

## 2013-11-06 NOTE — ED Provider Notes (Signed)
CSN: [id number]     Arrival date & time 11/06/13  1100 History  This chart was scribed for non-physician practitioner, Jeannett Senior, PA-C working with Merryl Hacker, MD by Frederich Balding, ED scribe. This patient was seen in room TR08C/TR08C and the patient's care was started at 12:35 PM.   Chief Complaint  Patient presents with  . Back Pain  . Knee Pain   The history is provided by the patient. No language interpreter was used.   HPI Comments: Caroline Williams is a 53 y.o. female with history of herniated disc, neuropathy and two right knee replacements who presents to the Emergency Department complaining of chronic right knee pain with associated leg swelling that worsened 3 days ago. States she retains fluid but her leg is never this swollen. Denies recent injury or fall. Bearing weight worsens the pain. She states she is being treated by Dr. Sharol Given and is also currently in pain management. Pt had a CT of her knee done 3 weeks ago due to possible fracture but states it only showed mild effusion. She has used ice and taken morphine and oxycodone with little relief. Denies history of blood clots.   Past Medical History  Diagnosis Date  . History of knee replacement, total   . Fever   . Breast lump   . Breast discharge   . Breast pain   . Diabetes mellitus   . Thyroid disease   . Asthma   . Hiatal hernia   . Arthritis   . Depression   . Hypertension   . Bipolar 1 disorder   . Hypothyroidism   . Sleep apnea    Past Surgical History  Procedure Laterality Date  . Cesarean section    . Breast lumpectomy      right  . Hernia repair    . Appendectomy    . Abdominal hysterectomy      partial  . Knee surgery      right  . Myomectomy abdominal approach  1989  . Myomectomy abdominal approach  1989  . Cardiac catheterization      no significant CAD, nl LV function by 11/08/06 cath  . Total knee revision  06/21/2012    Procedure: TOTAL KNEE REVISION;  Surgeon: Newt Minion, MD;   Location: [facility];  Service: Orthopedics;  Laterality: Right;  Revision Right Total Knee Arthroplasty   Family History  Problem Relation Age of Onset  . Cancer Father     colon  . Stroke Father   . Heart disease Father   . Depression Sister    History  Substance Use Topics  . Smoking status: Current Every Day Smoker -- 0.50 packs/day for 4 years    Types: Cigarettes  . Smokeless tobacco: Never Used  . Alcohol Use: No   OB History   Grav Para Term Preterm Abortions TAB SAB Ect Mult Living                 Review of Systems  Cardiovascular: Positive for leg swelling.  Musculoskeletal: Positive for arthralgias.  All other systems reviewed and are negative.  Allergies  Aspirin and Sulfa antibiotics  Home Medications   Prior to Admission medications   Medication Sig Start Date End Date Taking? Authorizing Provider  albuterol (PROVENTIL HFA;VENTOLIN HFA) 108 (90 BASE) MCG/ACT inhaler Inhale 2 puffs into the lungs every 6 (six) hours as needed for wheezing or shortness of breath. Shortness of breath    Historical Provider, MD  ARIPiprazole (ABILIFY)  5 MG tablet Take 5 mg by mouth at bedtime.     Historical Provider, MD  Asenapine Maleate (SAPHRIS SL) Place 2 tablets under the tongue at bedtime.     Historical Provider, MD  atenolol (TENORMIN) 25 MG tablet Take 12.5 mg by mouth daily.    Historical Provider, MD  buPROPion (WELLBUTRIN XL) 300 MG 24 hr tablet Take 300 mg by mouth daily.  06/14/13   Historical Provider, MD  Cyanocobalamin (B-12 PO) Take 1 tablet by mouth daily.     Historical Provider, MD  diazepam (VALIUM) 10 MG tablet Take 10 mg by mouth 3 (three) times daily.    Historical Provider, MD  diclofenac sodium (VOLTAREN) 1 % GEL Apply 2 g topically 3 (three) times daily. Right knee    Historical Provider, MD  fluticasone (FLONASE) 50 MCG/ACT nasal spray Place 2 sprays into the nose daily as needed (congestion).    Historical Provider, MD  gabapentin (NEURONTIN) 600 MG tablet  Take 600 mg by mouth 2 (two) times daily.    Historical Provider, MD  HYDROmorphone (DILAUDID) 2 MG tablet Take 2 mg by mouth every 4 (four) hours as needed for moderate pain.  06/01/13   Historical Provider, MD  ibuprofen (ADVIL,MOTRIN) 800 MG tablet Take 800 mg by mouth every 8 (eight) hours as needed.    Historical Provider, MD  levothyroxine (SYNTHROID, LEVOTHROID) 125 MCG tablet Take 125 mcg by mouth daily.     Historical Provider, MD  Lidocaine 0.5 % GEL Apply 1 application topically 2 (two) times daily. Right knee    Historical Provider, MD  losartan-hydrochlorothiazide (HYZAAR) 100-25 MG per tablet Take 1 tablet by mouth daily.  05/04/13   Historical Provider, MD  meloxicam (MOBIC) 15 MG tablet Take 1 tablet (15 mg total) by mouth daily. 06/20/13   Meredith Staggers, MD  metFORMIN (GLUCOPHAGE) 1000 MG tablet Take 1,000 mg by mouth 2 (two) times daily with a meal.    Historical Provider, MD  methylphenidate (RITALIN) 20 MG tablet Take 20 mg by mouth 2 (two) times daily with breakfast and lunch.  06/14/13   Historical Provider, MD  morphine (MS CONTIN) 15 MG 12 hr tablet Take 1 tablet (15 mg total) by mouth every 12 (twelve) hours. 10/24/13   Danella Sensing, NP  Multiple Vitamin (MULTIVITAMIN) tablet Take 1 tablet by mouth daily.    Historical Provider, MD  nabumetone (RELAFEN) 750 MG tablet  10/04/13   Historical Provider, MD  omeprazole (PRILOSEC) 40 MG capsule Take 40 mg by mouth daily as needed (acid reflex).     Historical Provider, MD  Oxycodone HCl 10 MG TABS Take 1 tablet (10 mg total) by mouth every 6 (six) hours as needed (for pain). 10/24/13   Danella Sensing, NP  rosuvastatin (CRESTOR) 20 MG tablet Take 20 mg by mouth daily.    Historical Provider, MD  ZETIA 10 MG tablet Take 10 mg by mouth daily.  05/03/13   Historical Provider, MD   BP 122/72  Pulse 88  Temp(Src) 98.3 F (36.8 C) (Oral)  SpO2 97%  Physical Exam  Nursing note and vitals reviewed. Constitutional: She is oriented to  person, place, and time. She appears well-developed and well-nourished. No distress.  HENT:  Head: Normocephalic and atraumatic.  Eyes: EOM are normal.  Neck: Neck supple. No tracheal deviation present.  Cardiovascular: Normal rate.   Dorsal pedal pulses are intact and equal bilaterally.  Pulmonary/Chest: Effort normal. No respiratory distress.  Musculoskeletal: Normal range of motion.  Normal appearing right knee with prior healed surgical incision. No erythema, no obvious swelling, however hard to determine given pt's body habitus. Bilateral non pitting edema. Tenderness to medial and lateral thigh, no signs of infection. Right calf tenderness. Positive homan's sign.   Neurological: She is alert and oriented to person, place, and time.  Skin: Skin is warm and dry.  Psychiatric: She has a normal mood and affect. Her behavior is normal.    ED Course  Procedures (including critical care time)  DIAGNOSTIC STUDIES: Oxygen Saturation is 97% on RA, normal by my interpretation.    COORDINATION OF CARE: 12:41 PM-Discussed treatment plan which includes blood work and an ultrasound with pt at bedside and pt agreed to plan.   Labs Review Labs Reviewed  CBC WITH DIFFERENTIAL - Abnormal; Notable for the following:    MCH 25.6 (*)    RDW 17.7 (*)    All other components within normal limits  BASIC METABOLIC PANEL - Abnormal; Notable for the following:    GFR calc non Af Amer 78 (*)    All other components within normal limits    Imaging Review No results found.   EKG Interpretation None      MDM   Final diagnoses:  Right knee pain  Peripheral edema    Pt with chronic right leg pain however she states the swelling is getting worse. No signs of infection in the knee. Patient does have tenderness of her thigh, knee, calf, suspicious for possible DVT. Will get venous Dopplers. We'll get labs to evaluate for edema. Patient is afebrile, signs of cauda equina, will treat her pain with  Dilaudid IM and Toradol IM.  Venous Dopplers are negative.  Labs unremarkable. Patient admits to a x-ray and CT of her right knee just 2 weeks ago. No injury since then. I suspect this is due to her chronic pain. Patient is on pain management, and able to give her prescriptions. She is asking for prednisone, will provide her a 5 day prednisone prescription. Instructed to followup with primary care Dr.  Danley Danker Vitals:   11/06/13 1113 11/06/13 1519  BP: 122/72 142/84  Pulse: 88 71  Temp: 98.3 F (36.8 C)   TempSrc: Oral   Resp:  18  SpO2: 97% 100%    I personally performed the services described in this documentation, which was scribed in my presence. The recorded information has been reviewed and is accurate.  Renold Genta, PA-C 11/06/13 1755  Renold Genta, PA-C 11/06/13 1756

## 2013-11-06 NOTE — ED Notes (Addendum)
Patient states chronic lower back problems with herniated discs that she is being treated by Dr. Sharol Given for.  Patient also complains of chronic R knee pain.   Patient advised she was in diabetes and nutritional class today and they brought her to ED. Patient is with pain management clinic.

## 2013-11-08 ENCOUNTER — Encounter: Payer: Self-pay | Admitting: Dietician

## 2013-11-08 ENCOUNTER — Encounter: Payer: Medicare HMO | Attending: Family Medicine | Admitting: Dietician

## 2013-11-08 DIAGNOSIS — Z713 Dietary counseling and surveillance: Secondary | ICD-10-CM | POA: Insufficient documentation

## 2013-11-08 DIAGNOSIS — Z6841 Body Mass Index (BMI) 40.0 and over, adult: Secondary | ICD-10-CM | POA: Diagnosis not present

## 2013-11-08 DIAGNOSIS — F411 Generalized anxiety disorder: Secondary | ICD-10-CM | POA: Insufficient documentation

## 2013-11-08 DIAGNOSIS — F3289 Other specified depressive episodes: Secondary | ICD-10-CM | POA: Diagnosis not present

## 2013-11-08 DIAGNOSIS — Z96659 Presence of unspecified artificial knee joint: Secondary | ICD-10-CM | POA: Diagnosis not present

## 2013-11-08 DIAGNOSIS — F329 Major depressive disorder, single episode, unspecified: Secondary | ICD-10-CM | POA: Diagnosis not present

## 2013-11-08 DIAGNOSIS — E669 Obesity, unspecified: Secondary | ICD-10-CM | POA: Diagnosis not present

## 2013-11-08 NOTE — Patient Instructions (Addendum)
-  Try cooking with extra virgin olive oil -Talk to doctor about getting a new meter -Try not to eat late at night -Start wearing partial dentures to help chew -Practice pre op goals (see handout) -Keep healthy snacks on hand to help avoid desserts (see snack list) -Focus on protein foods for snacks and meals -Fill up on non-starchy vegetables (any veggie except corn, peas, or potatoes) -Watch portions (practice reading food labels) -Try getting back into water aerobics  -Find ways to manage stress without eating

## 2013-11-08 NOTE — Progress Notes (Signed)
  Medical Nutrition Therapy:  Appt start time: 1100 end time:  1200.   Assessment:  Primary concerns today: Caroline Williams is here today reporting that her eating habits are out of control and she knows it is a danger to her health. She states that she is hungry all the time and eats the wrong things. She has [employer] protein powder with almond milk and fruits and vegetables every morning. She has a stressful job working with disabled veterans and suffers from anxiety, depression, and pre-menopause. She sees a psychiatrist regularly but recognizes, "I need to do my part." She is contemplating bariatric surgery, attended seminar and support group. Lost her meter, hasn't been taking blood glucose. Buys a sheet cake once a week and snacks on that in the evenings. Has severe bouts of depression, started after knee replacement. She recognizes that her depression and anxiety trigger emotional eating. 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  Preferred Learning Style:   No preference indicated   Learning Readiness:   Contemplating  Ready   MEDICATIONS: see list   DIETARY INTAKE:  24-hr recall:  B ( AM): GNC whey protein, celery, carrots, strawberries, unsweetened almond milk, spinach AND water  Snk ( AM): snacks at work (pie, cakes, cookies)  L ( PM): salad with fat free salad dressing and dessert Snk ( PM):  D ( PM): meat and potatoes, chef salad, fried fish and french fries  Snk ( PM): cake  Usual physical activity: membership to the [facility] but does not go  Estimated energy needs: 1600 calories 180 g carbohydrates 120 g protein 44 g fat  Progress Towards Goal(s):  No progress.   Nutritional Diagnosis:  [place]-3.3 Overweight/obesity As related to excessive energy intake and physical inactivity.  As evidenced by BMI 54.    Intervention:   Nutrition counseling provided. I answered the patient's questions regarding bariatric surgery. She expressed interest in RYGB  Teaching Method Utilized: Visual Auditory Hands on  Handouts given during visit include:  Pre op goals  15g CHO + protein snacks  Barriers to learning/adherence to lifestyle change: anxiety and depression  Demonstrated degree of understanding via:  Teach Back   Monitoring/Evaluation:  Dietary intake, exercise, and body weight in 1 month(s).

## 2013-11-09 ENCOUNTER — Encounter: Payer: Self-pay | Admitting: Dietician

## 2013-11-22 ENCOUNTER — Encounter: Payer: Medicare HMO | Attending: Physical Medicine & Rehabilitation | Admitting: Registered Nurse

## 2013-11-22 ENCOUNTER — Encounter: Payer: Self-pay | Admitting: Registered Nurse

## 2013-11-22 VITALS — BP 145/66 | HR 94 | Resp 16 | Ht 65.0 in | Wt 324.0 lb

## 2013-11-22 DIAGNOSIS — M1710 Unilateral primary osteoarthritis, unspecified knee: Secondary | ICD-10-CM | POA: Diagnosis present

## 2013-11-22 DIAGNOSIS — Z5181 Encounter for therapeutic drug level monitoring: Secondary | ICD-10-CM

## 2013-11-22 DIAGNOSIS — IMO0002 Reserved for concepts with insufficient information to code with codable children: Secondary | ICD-10-CM | POA: Diagnosis present

## 2013-11-22 DIAGNOSIS — M25569 Pain in unspecified knee: Secondary | ICD-10-CM | POA: Diagnosis not present

## 2013-11-22 DIAGNOSIS — Z79899 Other long term (current) drug therapy: Secondary | ICD-10-CM

## 2013-11-22 DIAGNOSIS — G8929 Other chronic pain: Secondary | ICD-10-CM | POA: Diagnosis present

## 2013-11-22 DIAGNOSIS — M1711 Unilateral primary osteoarthritis, right knee: Secondary | ICD-10-CM

## 2013-11-22 DIAGNOSIS — M25561 Pain in right knee: Secondary | ICD-10-CM

## 2013-11-22 MED ORDER — OXYCODONE HCL 10 MG PO TABS: 10.0000 mg | ORAL_TABLET | Freq: Four times a day (QID) | ORAL | Status: DC | PRN

## 2013-11-22 MED ORDER — MORPHINE SULFATE ER 15 MG PO TBCR: 15.0000 mg | EXTENDED_RELEASE_TABLET | Freq: Two times a day (BID) | ORAL | Status: DC

## 2013-11-22 NOTE — Progress Notes (Signed)
Subjective:    Patient ID: Caroline Williams, female    DOB: 1960-07-05, 53 y.o.   MRN: [id number]  HPI: [name] is a 53 year old female who returns for follow up for chronic pain and medication refill. She says her pain is located in her right knee. She rates her pain #6. Her current exercise regime is going to physical therapy three times a week, and she is attending aquatic aerobics twice a week. She says she has been experiencing excruciating pain in her right knee. She is asking about MRI, will speak to Dr. Naaman Plummer, she verbalizes understanding. She has spoke to Dr. Sharol Given and she feels as though she is not getting any answers. She is in the process of getting a second opinion, she is trying to find another orthopedist.Also states her lawyer is assisting her in this matter and also will be looking into [facility].   Pain Inventory Average Pain 7 Pain Right Now 6 My pain is throbbing  In the last 24 hours, has pain interfered with the following? General activity 9 Relation with others 9 Enjoyment of life 9 What TIME of day is your pain at its worst? morning and night Sleep (in general) Poor  Pain is worse with: walking, bending and standing Pain improves with: heat/ice, therapy/exercise, medication and TENS Relief from Meds: 7  Mobility walk with assistance use a cane use a walker how many minutes can you walk? 10 ability to climb steps?  no do you drive?  no  Function not employed: date last employed volunteer disabled: date disabled . I need assistance with the following:  dressing, bathing, meal prep, household duties and shopping  Neuro/Psych numbness trouble walking depression anxiety  Prior Studies Any changes since last visit?  no  Physicians involved in your care Any changes since last visit?  no   Family History  Problem Relation Age of Onset  . Cancer Father     colon  . Stroke Father   . Heart disease Father   . Depression Sister    History    Social History  . Marital Status: Single    Spouse Name: N/A    Number of Children: N/A  . Years of Education: N/A   Social History Main Topics  . Smoking status: Current Every Day Smoker -- 0.50 packs/day for 4 years    Types: Cigarettes  . Smokeless tobacco: Never Used  . Alcohol Use: No  . Drug Use: No  . Sexual Activity: Yes    Birth Control/ Protection: None   Other Topics Concern  . None   Social History Narrative  . None   Past Surgical History  Procedure Laterality Date  . Cesarean section    . Breast lumpectomy      right  . Hernia repair    . Appendectomy    . Abdominal hysterectomy      partial  . Knee surgery      right  . Myomectomy abdominal approach  1989  . Myomectomy abdominal approach  1989  . Cardiac catheterization      no significant CAD, nl LV function by 11/08/06 cath  . Total knee revision  06/21/2012    Procedure: TOTAL KNEE REVISION;  Surgeon: Newt Minion, MD;  Location: [facility];  Service: Orthopedics;  Laterality: Right;  Revision Right Total Knee Arthroplasty   Past Medical History  Diagnosis Date  . History of knee replacement, total   . Fever   . Breast  lump   . Breast discharge   . Breast pain   . Diabetes mellitus   . Thyroid disease   . Asthma   . Hiatal hernia   . Arthritis   . Depression   . Hypertension   . Bipolar 1 disorder   . Hypothyroidism   . Sleep apnea    BP 145/66  Pulse 94  Resp 16  Ht 5\' 5"  (1.651 m)  Wt 324 lb (146.965 kg)  BMI 53.92 kg/m2  SpO2 96%  Opioid Risk Score:   Fall Risk Score: Moderate Fall Risk (6-13 points) (educated and handout given for fall prevention in the home  at previous visit) Review of Systems  Musculoskeletal: Positive for gait problem.  Neurological: Positive for numbness.  Psychiatric/Behavioral: Positive for dysphoric mood. The patient is nervous/anxious.   All other systems reviewed and are negative.      Objective:   Physical Exam  Nursing note and vitals  reviewed. Constitutional: She is oriented to person, place, and time. She appears well-developed and well-nourished.  Obese  HENT:  Head: Normocephalic and atraumatic.  Neck: Normal range of motion. Neck supple.  Cardiovascular: Normal rate and regular rhythm.   Pulmonary/Chest: Effort normal. She has wheezes.  Musculoskeletal:  Normal Muscle Bulk and Muscle Testing Reveals: Upper Extremities: Full ROM and Muscle Strength 5/5. Lumbar Paraspinal Tenderness: L-3- L-5 Lower Extremities: Lefty Leg: Full ROM and Muscle Strength 5/5. Right Leg: Right Knee Brace On/ Right Knee Flexion Produces Pain into Patella and medial and lateral joint line. Tenderness with palpation with swelling noted. Arises from Chair with slight difficulty. Antalgic Gait Uses straight cane for support.  Neurological: She is alert and oriented to person, place, and time.  Skin: Skin is warm and dry.  Psychiatric: She has a normal mood and affect.          Assessment & Plan:  1. Right Knee Pain, History of OA and History of knee replacement with revision:  Refilled: MS CONTIN 15 mg one tablet every 12 hours #60 and Oxycodone 10 mg one tablet every 6 hours as needed for pain #120  2. Morbid Obesity:Going to [facility] for Nutritional and Diabetic Classes: Continue to Work on [employer] and weight loss.  3. DM2 with neuropathy: Continue with Voltaren gel and gabapentin   30 minutes of face to face patient care time was spent during this visit. All questions were encouraged and answered.   F/U in 1 month

## 2013-12-03 ENCOUNTER — Telehealth: Payer: Self-pay | Admitting: Registered Nurse

## 2013-12-03 NOTE — Telephone Encounter (Signed)
Return Ms. Pousson call. She was informed we won't be ordering a MRI. If her orthopedist feels its necessary they can order. She verbalizes understanding.

## 2013-12-19 ENCOUNTER — Encounter: Payer: Medicare HMO | Attending: Family Medicine | Admitting: Dietician

## 2013-12-19 DIAGNOSIS — Z6841 Body Mass Index (BMI) 40.0 and over, adult: Secondary | ICD-10-CM | POA: Insufficient documentation

## 2013-12-19 DIAGNOSIS — F411 Generalized anxiety disorder: Secondary | ICD-10-CM | POA: Diagnosis not present

## 2013-12-19 DIAGNOSIS — F3289 Other specified depressive episodes: Secondary | ICD-10-CM | POA: Insufficient documentation

## 2013-12-19 DIAGNOSIS — F329 Major depressive disorder, single episode, unspecified: Secondary | ICD-10-CM | POA: Insufficient documentation

## 2013-12-19 DIAGNOSIS — Z713 Dietary counseling and surveillance: Secondary | ICD-10-CM | POA: Diagnosis present

## 2013-12-19 DIAGNOSIS — E669 Obesity, unspecified: Secondary | ICD-10-CM | POA: Diagnosis not present

## 2013-12-19 DIAGNOSIS — Z96659 Presence of unspecified artificial knee joint: Secondary | ICD-10-CM | POA: Diagnosis not present

## 2013-12-19 NOTE — Progress Notes (Signed)
  Medical Nutrition Therapy:  Appt start time: 1200 end time:  1230.  Follow up: Caroline Williams returns today having lost almost 10 pounds in the last 6 weeks. She reports that she has been doing water aerobics and has been using Splenda instead of sugar. She has also been drinking GNC whey protein shakes and eating Dannon Light and Fit yogurt. She reports she has been trying to avoid cake. However, she still has sweets at work sometimes. Trying to manage stress without eating. She received a new meter but reports her HgbA1c has been high (about 7% per patient report).   Wt Readings from Last 3 Encounters:  12/19/13 317 lb 1.6 oz (143.836 kg)  11/22/13 324 lb (146.965 kg)  11/08/13 326 lb 8 oz (148.099 kg)   Ht Readings from Last 3 Encounters:  12/19/13 5\' 5"  (1.651 m)  11/22/13 5\' 5"  (1.651 m)  11/08/13 5\' 5"  (1.651 m)   Body mass index is 52.77 kg/(m^2). @BMIFA @ Normalized weight-for-age data available only for age 61 to 33 years. Normalized stature-for-age data available only for age 61 to 27 years.   Preferred Learning Style:   No preference indicated   Learning Readiness:   Ready   MEDICATIONS: see list   DIETARY INTAKE:  24-hr recall:  B ( AM): water and strawberries  Snk ( AM):  L ( PM): salad with fat free salad dressing and 4 crackers Snk ( PM):  D ( PM): sometimes fried chicken; grilled BBQ chicken  Snk ( PM): sugar free Klondike bars, sugar free popsicles  Beverages: water  Usual physical activity: water aerobics 2x a week; physical therapy 1x a week  Estimated energy needs: 1600 calories 180 g carbohydrates 120 g protein 44 g fat  Progress Towards Goal(s):  No progress.   Nutritional Diagnosis:  [place]-3.3 Overweight/obesity As related to excessive energy intake and physical inactivity.  As evidenced by BMI 54.    Intervention:  Nutrition counseling provided. I answered the patient's questions regarding bariatric surgery. She expressed interest in  RYGB  Teaching Method Utilized: Visual Auditory Hands on  Handouts given during visit include:  Low sodium flavoring tips  15 g CHO + protein snacks  Barriers to learning/adherence to lifestyle change: anxiety and depression  Demonstrated degree of understanding via:  Teach Back   Monitoring/Evaluation:  Dietary intake, exercise, and body weight in 1 month(s).

## 2013-12-19 NOTE — Patient Instructions (Addendum)
-  Try cooking with extra virgin olive oil -Talk to doctor about getting a new meter -Try not to eat late at night -Start wearing partial dentures to help chew -Practice pre op goals (see handout) -Keep healthy snacks on hand to help avoid desserts (see snack list) -Focus on protein foods for snacks and meals -Fill up on non-starchy vegetables (any veggie except corn, peas, or potatoes) -Watch portions (practice reading food labels) -Try getting back into water aerobics -Find ways to manage stress without eating   -Have something with protein at every meal and snack -Keep having broth-based soups for lunch several days a week -Continue doing water aerobics! -Try Smart Balance butter -Try seasoning without salt -Try [place] Kick products  **Goal: To be healthy!!!**

## 2013-12-24 ENCOUNTER — Emergency Department (HOSPITAL_COMMUNITY): Payer: Medicare HMO

## 2013-12-24 ENCOUNTER — Encounter: Payer: Medicare HMO | Attending: Physical Medicine & Rehabilitation | Admitting: Registered Nurse

## 2013-12-24 ENCOUNTER — Encounter (HOSPITAL_COMMUNITY): Payer: Self-pay | Admitting: Emergency Medicine

## 2013-12-24 ENCOUNTER — Emergency Department (HOSPITAL_COMMUNITY)
Admission: EM | Admit: 2013-12-24 | Discharge: 2013-12-24 | Disposition: A | Discharge status: Home / Self Care | Payer: Medicare HMO | Attending: Emergency Medicine | Admitting: Emergency Medicine

## 2013-12-24 ENCOUNTER — Encounter: Payer: Self-pay | Admitting: Registered Nurse

## 2013-12-24 VITALS — BP 133/73 | HR 92 | Resp 16 | Wt 315.0 lb

## 2013-12-24 DIAGNOSIS — M25561 Pain in right knee: Secondary | ICD-10-CM

## 2013-12-24 DIAGNOSIS — T84039A Mechanical loosening of unspecified internal prosthetic joint, initial encounter: Secondary | ICD-10-CM | POA: Insufficient documentation

## 2013-12-24 DIAGNOSIS — IMO0002 Reserved for concepts with insufficient information to code with codable children: Secondary | ICD-10-CM | POA: Insufficient documentation

## 2013-12-24 DIAGNOSIS — G8929 Other chronic pain: Secondary | ICD-10-CM | POA: Insufficient documentation

## 2013-12-24 DIAGNOSIS — M25569 Pain in unspecified knee: Secondary | ICD-10-CM | POA: Diagnosis not present

## 2013-12-24 DIAGNOSIS — F319 Bipolar disorder, unspecified: Secondary | ICD-10-CM | POA: Insufficient documentation

## 2013-12-24 DIAGNOSIS — T84038A Mechanical loosening of other internal prosthetic joint, initial encounter: Secondary | ICD-10-CM

## 2013-12-24 DIAGNOSIS — M1711 Unilateral primary osteoarthritis, right knee: Secondary | ICD-10-CM

## 2013-12-24 DIAGNOSIS — Z8742 Personal history of other diseases of the female genital tract: Secondary | ICD-10-CM | POA: Insufficient documentation

## 2013-12-24 DIAGNOSIS — W1809XA Striking against other object with subsequent fall, initial encounter: Secondary | ICD-10-CM | POA: Insufficient documentation

## 2013-12-24 DIAGNOSIS — Y929 Unspecified place or not applicable: Secondary | ICD-10-CM | POA: Insufficient documentation

## 2013-12-24 DIAGNOSIS — Z79899 Other long term (current) drug therapy: Secondary | ICD-10-CM

## 2013-12-24 DIAGNOSIS — M1710 Unilateral primary osteoarthritis, unspecified knee: Secondary | ICD-10-CM

## 2013-12-24 DIAGNOSIS — Y9389 Activity, other specified: Secondary | ICD-10-CM | POA: Insufficient documentation

## 2013-12-24 DIAGNOSIS — S060X0A Concussion without loss of consciousness, initial encounter: Secondary | ICD-10-CM | POA: Insufficient documentation

## 2013-12-24 DIAGNOSIS — F17200 Nicotine dependence, unspecified, uncomplicated: Secondary | ICD-10-CM | POA: Insufficient documentation

## 2013-12-24 DIAGNOSIS — I10 Essential (primary) hypertension: Secondary | ICD-10-CM | POA: Insufficient documentation

## 2013-12-24 DIAGNOSIS — J45909 Unspecified asthma, uncomplicated: Secondary | ICD-10-CM | POA: Insufficient documentation

## 2013-12-24 DIAGNOSIS — E039 Hypothyroidism, unspecified: Secondary | ICD-10-CM | POA: Insufficient documentation

## 2013-12-24 DIAGNOSIS — R110 Nausea: Secondary | ICD-10-CM | POA: Insufficient documentation

## 2013-12-24 DIAGNOSIS — Z5181 Encounter for therapeutic drug level monitoring: Secondary | ICD-10-CM

## 2013-12-24 DIAGNOSIS — M129 Arthropathy, unspecified: Secondary | ICD-10-CM | POA: Insufficient documentation

## 2013-12-24 DIAGNOSIS — Z96659 Presence of unspecified artificial knee joint: Secondary | ICD-10-CM

## 2013-12-24 DIAGNOSIS — E119 Type 2 diabetes mellitus without complications: Secondary | ICD-10-CM | POA: Insufficient documentation

## 2013-12-24 MED ORDER — HYDROMORPHONE HCL PF 2 MG/ML IJ SOLN
2.0000 mg | Freq: Once | INTRAMUSCULAR | Status: AC
  Administered 2013-12-24: 2 mg via INTRAVENOUS
  Filled 2013-12-24: qty 1

## 2013-12-24 MED ORDER — HYDROMORPHONE HCL PF 1 MG/ML IJ SOLN
1.0000 mg | Freq: Once | INTRAMUSCULAR | Status: AC
  Administered 2013-12-24: 1 mg via INTRAVENOUS
  Filled 2013-12-24: qty 1

## 2013-12-24 MED ORDER — OXYCODONE HCL 10 MG PO TABS: 10.0000 mg | ORAL_TABLET | Freq: Four times a day (QID) | ORAL | Status: DC | PRN

## 2013-12-24 MED ORDER — MORPHINE SULFATE ER 15 MG PO TBCR: 15.0000 mg | EXTENDED_RELEASE_TABLET | Freq: Two times a day (BID) | ORAL | Status: DC

## 2013-12-24 NOTE — ED Notes (Signed)
Bed: VE93 Expected date:  Expected time:  Means of arrival:  Comments: ems- fall, bumped her head, knee pain.

## 2013-12-24 NOTE — ED Provider Notes (Signed)
CSN: [id number]     Arrival date & time 12/24/13  4742 History   First MD Initiated Contact with Patient 12/24/13 1001     Chief Complaint  Patient presents with  . fall on Saturday   . head pain   . Knee Pain     (Consider location/radiation/quality/duration/timing/severity/associated sxs/prior Treatment) HPI 53 year old female presents with right knee pain and a posterior headache after falling 2 days ago. Patient has had chronic right knee problems and had 2 knee replacements. She's been having swelling in her right knee over last 8 months after a fall. The patient's knee worsened significantly over the last 2 days if she's having trouble walking on it. She states the initial fall happened because her right knee "gave out on her". She's been having swelling to the back of her head and having intermittent worsening headaches, photophobia, nausea, and speech difficulty during the headache. Her headache improved she has no speech difficulty or the other symptoms. As a focal weakness or numbness. No neck pain. Pain is currently an 8/10. She's been taking her oxycodone and morphine without any relief.  Past Medical History  Diagnosis Date  . History of knee replacement, total   . Fever   . Breast lump   . Breast discharge   . Breast pain   . Diabetes mellitus   . Thyroid disease   . Asthma   . Hiatal hernia   . Arthritis   . Depression   . Hypertension   . Bipolar 1 disorder   . Hypothyroidism   . Sleep apnea    Past Surgical History  Procedure Laterality Date  . Cesarean section    . Breast lumpectomy      right  . Hernia repair    . Appendectomy    . Abdominal hysterectomy      partial  . Knee surgery      right  . Myomectomy abdominal approach  1989  . Myomectomy abdominal approach  1989  . Cardiac catheterization      no significant CAD, nl LV function by 11/08/06 cath  . Total knee revision  06/21/2012    Procedure: TOTAL KNEE REVISION;  Surgeon: Newt Minion, MD;   Location: [facility];  Service: Orthopedics;  Laterality: Right;  Revision Right Total Knee Arthroplasty   Family History  Problem Relation Age of Onset  . Cancer Father     colon  . Stroke Father   . Heart disease Father   . Depression Sister    History  Substance Use Topics  . Smoking status: Current Every Day Smoker -- 0.50 packs/day for 4 years    Types: Cigarettes  . Smokeless tobacco: Never Used  . Alcohol Use: No   OB History   Grav Para Term Preterm Abortions TAB SAB Ect Mult Living                 Review of Systems  Gastrointestinal: Positive for nausea.  Musculoskeletal: Positive for arthralgias.  Neurological: Positive for speech difficulty and headaches. Negative for weakness and numbness.  All other systems reviewed and are negative.     Allergies  Aspirin and Sulfa antibiotics  Home Medications   Prior to Admission medications   Medication Sig Start Date End Date Taking? Authorizing Provider  ADVAIR DISKUS 500-50 MCG/DOSE AEPB Inhale 1 puff into the lungs 2 (two) times daily.  12/05/13  Yes Historical Provider, MD  albuterol (PROVENTIL HFA;VENTOLIN HFA) 108 (90 BASE) MCG/ACT inhaler Inhale 2 puffs  into the lungs every 6 (six) hours as needed for wheezing or shortness of breath. Shortness of breath   Yes Historical Provider, MD  ARIPiprazole (ABILIFY) 5 MG tablet Take 5 mg by mouth at bedtime.    Yes Historical Provider, MD  Asenapine Maleate (SAPHRIS SL) Place 2 tablets under the tongue at bedtime.    Yes Historical Provider, MD  buPROPion (WELLBUTRIN XL) 300 MG 24 hr tablet Take 300 mg by mouth daily.  06/14/13  Yes Historical Provider, MD  Cyanocobalamin (B-12 PO) Take 1 tablet by mouth daily.    Yes Historical Provider, MD  diazepam (VALIUM) 10 MG tablet Take 10 mg by mouth 3 (three) times daily.   Yes Historical Provider, MD  diclofenac sodium (VOLTAREN) 1 % GEL Apply 2 g topically 3 (three) times daily. Right knee   Yes Historical Provider, MD  fluticasone  (FLONASE) 50 MCG/ACT nasal spray Place 2 sprays into the nose daily as needed (congestion).   Yes Historical Provider, MD  gabapentin (NEURONTIN) 600 MG tablet Take 600 mg by mouth 2 (two) times daily.   Yes Historical Provider, MD  ibuprofen (ADVIL,MOTRIN) 800 MG tablet Take 800 mg by mouth every 8 (eight) hours as needed.   Yes Historical Provider, MD  levothyroxine (SYNTHROID, LEVOTHROID) 125 MCG tablet Take 125 mcg by mouth daily.    Yes Historical Provider, MD  Lidocaine 0.5 % GEL Apply 1 application topically 2 (two) times daily. Right knee   Yes Historical Provider, MD  losartan-hydrochlorothiazide (HYZAAR) 100-25 MG per tablet Take 1 tablet by mouth daily.  05/04/13  Yes Historical Provider, MD  metFORMIN (GLUCOPHAGE) 1000 MG tablet Take 1,000 mg by mouth 2 (two) times daily with a meal.   Yes Historical Provider, MD  methylphenidate (RITALIN) 20 MG tablet Take 20 mg by mouth 2 (two) times daily with breakfast and lunch.  06/14/13  Yes Historical Provider, MD  morphine (MS CONTIN) 15 MG 12 hr tablet Take 1 tablet (15 mg total) by mouth every 12 (twelve) hours. 12/24/13  Yes Danella Sensing, NP  Multiple Vitamin (MULTIVITAMIN) tablet Take 1 tablet by mouth daily.   Yes Historical Provider, MD  nabumetone (RELAFEN) 750 MG tablet Take 750 mg by mouth 2 (two) times daily as needed for mild pain.  10/04/13  Yes Historical Provider, MD  omeprazole (PRILOSEC) 40 MG capsule Take 40 mg by mouth daily as needed (acid reflex).    Yes Historical Provider, MD  Oxycodone HCl 10 MG TABS Take 1 tablet (10 mg total) by mouth every 6 (six) hours as needed (for pain). 12/24/13  Yes Danella Sensing, NP  rosuvastatin (CRESTOR) 20 MG tablet Take 20 mg by mouth daily.   Yes Historical Provider, MD  ZETIA 10 MG tablet Take 10 mg by mouth daily.  05/03/13  Yes Historical Provider, MD   BP 131/77  Pulse 74  Temp(Src) 97.7 F (36.5 C) (Oral)  Resp 18  SpO2 98% Physical Exam  Nursing note and vitals  reviewed. Constitutional: She is oriented to person, place, and time. She appears well-developed and well-nourished.  HENT:  Head: Normocephalic.    Right Ear: External ear normal.  Left Ear: External ear normal.  Nose: Nose normal.  Eyes: EOM are normal. Pupils are equal, round, and reactive to light. Right eye exhibits no discharge. Left eye exhibits no discharge.  Cardiovascular: Normal rate, regular rhythm and normal heart sounds.   Pulmonary/Chest: Effort normal and breath sounds normal.  Abdominal: Soft. She exhibits no distension.  Musculoskeletal:       Right knee: Tenderness found. Medial joint line tenderness noted.       Legs: Neurological: She is alert and oriented to person, place, and time.  CN 2-12 grossly intact. 5/5 strength in all 4 extremities  Skin: Skin is warm and dry.    ED Course  Procedures (including critical care time) Labs Review Labs Reviewed - No data to display  Imaging Review Ct Head Wo Contrast  12/24/2013   CLINICAL DATA:  Slurred speech band dizziness since a fall 2 days ago. Headache and scalp pain.  EXAM: CT HEAD WITHOUT CONTRAST  TECHNIQUE: Contiguous axial images were obtained from the base of the skull through the vertex without intravenous contrast.  COMPARISON:  CT scan dated 03/26/2013  FINDINGS: No mass lesion. No midline shift. No acute hemorrhage or hematoma. No extra-axial fluid collections. No evidence of acute infarction. There is slight cerebral cortical atrophy, more prominent in the right hemisphere than the left. No osseous abnormality. No visible scalp hematoma.  IMPRESSION: No acute intracranial abnormality. Stable appearance of the brain since 03/26/2013.   Electronically Signed   By: Rozetta Nunnery M.D.   On: 12/24/2013 10:54   Dg Knee Complete 4 Views Right  12/24/2013   CLINICAL DATA:  Two prior surgeries most recent 2014. Multiple falls.  EXAM: RIGHT KNEE - COMPLETE 4+ VIEW  COMPARISON:  09/26/2013 CT. 08/22/2013 and 04/13/2013  plain film exam.  FINDINGS: Post total right knee replacement. Lucencies surrounding portions of the prosthesis (femoral and tibial component), a majority of which appear similar to prior examination and may indicate loosening.  When compared to prior plain film examinations, the lucency surrounding the lateral aspect of the tibial component of the prosthesis and subsequent remodeling of the adjacent fibula head appears slightly more prominent which may indicate further loosening.  Given these findings, excluding subtle injury or infection is difficult although no obvious fracture or dislocation is noted.  Mild thickening mid to inferior patellar tendon and and suggestion tiny joint effusion.  IMPRESSION: Post total right knee replacement. Lucencies surrounding portions of the prosthesis (femoral and tibial component), a majority of which appear similar to prior examination and may indicate loosening.  When compared to prior plain film examinations, the lucency surrounding the lateral aspect of the tibial component of the prosthesis and subsequent remodeling of the adjacent fibula head appears slightly more prominent which may indicate further loosening.  Given these findings, excluding subtle injury or infection is difficult although no obvious fracture or dislocation is noted.  Mild thickening mid to inferior patellar tendon and and suggestion tiny joint effusion.   Electronically Signed   By: Chauncey Cruel M.D.   On: 12/24/2013 11:23     EKG Interpretation None      MDM   Final diagnoses:  Concussion, without loss of consciousness, initial encounter  Loosening of knee joint prosthesis, initial encounter    Patient's CT of her head is negative. I feel her symptoms are most likely related to a concussion. Her right knee shows possible early prosthesis loosening versus subtle fracture. I discussed this with her orthopedic surgeon, Dr. Sharol Given, who recommends knee immobilizer and followup in his office  this week. I discussed this with the patient and she feels pain is well controlled and is stable for discharge.    Ephraim Hamburger, MD 12/24/13 [phone]

## 2013-12-24 NOTE — ED Notes (Signed)
Pt escorted to discharge window. Pt verbalized understanding discharge instructions. In no acute distress.  

## 2013-12-24 NOTE — ED Notes (Signed)
Per ems pt reports she fell on Saturday, hit her head, but did not go to the doctor Saturday. Today pt went to pcp reports head is still tender, practitioner stated no mass or swelling noted. pcp thought it best for pt to come to ED to be evaluated. Pt also has right knee pain from the fall that she wants evaluated. Hx of right knee surgery

## 2013-12-24 NOTE — Discharge Instructions (Signed)
Concussion  A concussion, or closed-head injury, is a brain injury caused by a direct blow to the head or by a quick and sudden movement (jolt) of the head or neck. Concussions are usually not life-threatening. Even so, the effects of a concussion can be serious. If you have had a concussion before, you are more likely to experience concussion-like symptoms after a direct blow to the head.   CAUSES  · Direct blow to the head, such as from running into another player during a soccer game, being hit in a fight, or hitting your head on a hard surface.  · A jolt of the head or neck that causes the brain to move back and forth inside the skull, such as in a car crash.  SIGNS AND SYMPTOMS  The signs of a concussion can be hard to notice. Early on, they may be missed by you, family members, and health care providers. You may look fine but act or feel differently.  Symptoms are usually temporary, but they may last for days, weeks, or even longer. Some symptoms may appear right away while others may not show up for hours or days. Every head injury is different. Symptoms include:  · Mild to moderate headaches that will not go away.  · A feeling of pressure inside your head.  · Having more trouble than usual:  ¨ Learning or remembering things you have heard.  ¨ Answering questions.  ¨ Paying attention or concentrating.  ¨ Organizing daily tasks.  ¨ Making decisions and solving problems.  · Slowness in thinking, acting or reacting, speaking, or reading.  · Getting lost or being easily confused.  · Feeling tired all the time or lacking energy (fatigued).  · Feeling drowsy.  · Sleep disturbances.  ¨ Sleeping more than usual.  ¨ Sleeping less than usual.  ¨ Trouble falling asleep.  ¨ Trouble sleeping (insomnia).  · Loss of balance or feeling lightheaded or dizzy.  · Nausea or vomiting.  · Numbness or tingling.  · Increased sensitivity to:  ¨ Sounds.  ¨ Lights.  ¨ Distractions.  · Vision problems or eyes that tire  easily.  · Diminished sense of taste or smell.  · Ringing in the ears.  · Mood changes such as feeling sad or anxious.  · Becoming easily irritated or angry for little or no reason.  · Lack of motivation.  · Seeing or hearing things other people do not see or hear (hallucinations).  DIAGNOSIS  Your health care provider can usually diagnose a concussion based on a description of your injury and symptoms. He or she will ask whether you passed out (lost consciousness) and whether you are having trouble remembering events that happened right before and during your injury.  Your evaluation might include:  · A brain scan to look for signs of injury to the brain. Even if the test shows no injury, you may still have a concussion.  · Blood tests to be sure other problems are not present.  TREATMENT  · Concussions are usually treated in an emergency department, in urgent care, or at a clinic. You may need to stay in the hospital overnight for further treatment.  · Tell your health care provider if you are taking any medicines, including prescription medicines, over-the-counter medicines, and natural remedies. Some medicines, such as blood thinners (anticoagulants) and aspirin, may increase the chance of complications. Also tell your health care provider whether you have had alcohol or are taking illegal drugs. This information   may affect treatment.  · Your health care provider will send you home with important instructions to follow.  · How fast you will recover from a concussion depends on many factors. These factors include how severe your concussion is, what part of your brain was injured, your age, and how healthy you were before the concussion.  · Most people with mild injuries recover fully. Recovery can take time. In general, recovery is slower in older persons. Also, persons who have had a concussion in the past or have other medical problems may find that it takes longer to recover from their current injury.  HOME  CARE INSTRUCTIONS  General Instructions  · Carefully follow the directions your health care provider gave you.  · Only take over-the-counter or prescription medicines for pain, discomfort, or fever as directed by your health care provider.  · Take only those medicines that your health care provider has approved.  · Do not drink alcohol until your health care provider says you are well enough to do so. Alcohol and certain other drugs may slow your recovery and can put you at risk of further injury.  · If it is harder than usual to remember things, write them down.  · If you are easily distracted, try to do one thing at a time. For example, do not try to watch TV while fixing dinner.  · Talk with family members or close friends when making important decisions.  · Keep all follow-up appointments. Repeated evaluation of your symptoms is recommended for your recovery.  · Watch your symptoms and tell others to do the same. Complications sometimes occur after a concussion. Older adults with a brain injury may have a higher risk of serious complications, such as a blood clot on the brain.  · Tell your teachers, school nurse, school counselor, coach, athletic trainer, or work manager about your injury, symptoms, and restrictions. Tell them about what you can or cannot do. They should watch for:  ¨ Increased problems with attention or concentration.  ¨ Increased difficulty remembering or learning new information.  ¨ Increased time needed to complete tasks or assignments.  ¨ Increased irritability or decreased ability to cope with stress.  ¨ Increased symptoms.  · Rest. Rest helps the brain to heal. Make sure you:  ¨ Get plenty of sleep at night. Avoid staying up late at night.  ¨ Keep the same bedtime hours on weekends and weekdays.  ¨ Rest during the day. Take daytime naps or rest breaks when you feel tired.  · Limit activities that require a lot of thought or concentration. These include:  ¨ Doing homework or job-related  work.  ¨ Watching TV.  ¨ Working on the computer.  · Avoid any situation where there is potential for another head injury (football, hockey, soccer, basketball, martial arts, downhill snow sports and horseback riding). Your condition will get worse every time you experience a concussion. You should avoid these activities until you are evaluated by the appropriate follow-up health care providers.  Returning To Your Regular Activities  You will need to return to your normal activities slowly, not all at once. You must give your body and brain enough time for recovery.  · Do not return to sports or other athletic activities until your health care provider tells you it is safe to do so.  · Ask your health care provider when you can drive, ride a bicycle, or operate heavy machinery. Your ability to react may be slower after a   brain injury. Never do these activities if you are dizzy.  · Ask your health care provider about when you can return to work or school.  Preventing Another Concussion  It is very important to avoid another brain injury, especially before you have recovered. In rare cases, another injury can lead to permanent brain damage, brain swelling, or death. The risk of this is greatest during the first 7-10 days after a head injury. Avoid injuries by:  · Wearing a seat belt when riding in a car.  · Drinking alcohol only in moderation.  · Wearing a helmet when biking, skiing, skateboarding, skating, or doing similar activities.  · Avoiding activities that could lead to a second concussion, such as contact or recreational sports, until your health care provider says it is okay.  · Taking safety measures in your home.  ¨ Remove clutter and tripping hazards from floors and stairways.  ¨ Use grab bars in bathrooms and handrails by stairs.  ¨ Place non-slip mats on floors and in bathtubs.  ¨ Improve lighting in dim areas.  SEEK MEDICAL CARE IF:  · You have increased problems paying attention or  concentrating.  · You have increased difficulty remembering or learning new information.  · You need more time to complete tasks or assignments than before.  · You have increased irritability or decreased ability to cope with stress.  · You have more symptoms than before.  Seek medical care if you have any of the following symptoms for more than 2 weeks after your injury:  · Lasting (chronic) headaches.  · Dizziness or balance problems.  · Nausea.  · Vision problems.  · Increased sensitivity to noise or light.  · Depression or mood swings.  · Anxiety or irritability.  · Memory problems.  · Difficulty concentrating or paying attention.  · Sleep problems.  · Feeling tired all the time.  SEEK IMMEDIATE MEDICAL CARE IF:  · You have severe or worsening headaches. These may be a sign of a blood clot in the brain.  · You have weakness (even if only in one hand, leg, or part of the face).  · You have numbness.  · You have decreased coordination.  · You vomit repeatedly.  · You have increased sleepiness.  · One pupil is larger than the other.  · You have convulsions.  · You have slurred speech.  · You have increased confusion. This may be a sign of a blood clot in the brain.  · You have increased restlessness, agitation, or irritability.  · You are unable to recognize people or places.  · You have neck pain.  · It is difficult to wake you up.  · You have unusual behavior changes.  · You lose consciousness.  MAKE SURE YOU:  · Understand these instructions.  · Will watch your condition.  · Will get help right away if you are not doing well or get worse.  Document Released: 08/21/2003 Document Revised: 06/05/2013 Document Reviewed: 12/21/2012  ExitCare® Patient Information ©2015 [employer]. This information is not intended to replace advice given to you by your health care provider. Make sure you discuss any questions you have with your health care provider.

## 2013-12-24 NOTE — ED Notes (Signed)
Ortho still work on a conscious sedation and then will get to pt and knee immobilizer

## 2013-12-24 NOTE — Progress Notes (Signed)
Subjective:    Patient ID: Caroline Williams, female    DOB: 1961/05/05, 53 y.o.   MRN: [id number]  HPI: [name] is a 53 year old female who returns for follow up for chronic pain and medication refill. She says her pain is located in her head, lower back and bilateral knees. She rates her pain 10. Her usual exercise regime prior to her fall last Saturday on 12/22/13 was attending water aerobics twice a week. She goes to physical therapy three times a week. On December 22 2013 around 7:15 in am. She was headed out of her front dorr when her right knee gave out. She lost her balance and fell backwards. Her head hit the door. Her personal care aide was with her and the SCAT driver helped her up. She refused to go to the ED in spite of her aide persuasion. 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 When she came home, she had to have her boyfriend assist her out of the cab. She has stayed in the bed since Saturday evening. She was using ice therapy on her head and knee. Appetite was poor also, she admits she was checking on her blood sugars. Also states" when her head begins to hurt, her speech becomes slurred. With palpating the medial plane she says very painful. She has agreed to go to [facility] to be evaluated. EMS called. She also mention she called [facility] for second opinion regarding her right knee, she is awaiting a call back. Her PCP also assisting her with this matter.  EMS checked her blood sugar today BS 94. Transferred to [facility].   Pain Inventory Average Pain 10 Pain Right Now 10 My pain is constant, stabbing and aching  In the last 24 hours, has pain interfered with the following? General activity 10 Relation with others 10 Enjoyment of life 10 What TIME of day is your pain at its worst? all Sleep (in general) Poor  Pain is worse with: walking, bending, inactivity and standing Pain improves with: rest and medication Relief from Meds:  9  Mobility use a cane use a walker use a wheelchair  Function employed # of hrs/week . I need assistance with the following:  dressing, bathing, meal prep, household duties and shopping  Neuro/Psych weakness trouble walking dizziness  Prior Studies Any changes since last visit?  no  Physicians involved in your care Any changes since last visit?  no   Family History  Problem Relation Age of Onset  . Cancer Father     colon  . Stroke Father   . Heart disease Father   . Depression Sister    History   Social History  . Marital Status: Single    Spouse Name: N/A    Number of Children: N/A  . Years of Education: N/A   Social History Main Topics  . Smoking status: Current Every Day Smoker -- 0.50 packs/day for 4 years    Types: Cigarettes  . Smokeless tobacco: Never Used  . Alcohol Use: No  . Drug Use: No  . Sexual Activity: Yes    Birth Control/ Protection: None   Other Topics Concern  . None   Social History Narrative  . None   Past Surgical History  Procedure Laterality Date  . Cesarean section    . Breast lumpectomy      right  . Hernia repair    . Appendectomy    .  Abdominal hysterectomy      partial  . Knee surgery      right  . Myomectomy abdominal approach  1989  . Myomectomy abdominal approach  1989  . Cardiac catheterization      no significant CAD, nl LV function by 11/08/06 cath  . Total knee revision  06/21/2012    Procedure: TOTAL KNEE REVISION;  Surgeon: Newt Minion, MD;  Location: [place];  Service: Orthopedics;  Laterality: Right;  Revision Right Total Knee Arthroplasty   Past Medical History  Diagnosis Date  . History of knee replacement, total   . Fever   . Breast lump   . Breast discharge   . Breast pain   . Diabetes mellitus   . Thyroid disease   . Asthma   . Hiatal hernia   . Arthritis   . Depression   . Hypertension   . Bipolar 1 disorder   . Hypothyroidism   . Sleep apnea    BP 133/73  Pulse 92  Resp 16  Wt  315 lb (142.883 kg)  SpO2 98%  Opioid Risk Score:   Fall Risk Score: High Fall Risk (>13 points) (previously educated and given an handout on fall prevention in the home) Review of Systems  Musculoskeletal: Positive for gait problem.  Neurological: Positive for dizziness and weakness.  All other systems reviewed and are negative.      Objective:   Physical Exam  Nursing note and vitals reviewed. Constitutional: She is oriented to person, place, and time. She appears well-developed and well-nourished.  HENT:  Head: Normocephalic and atraumatic.  Neck: Normal range of motion. Neck supple.  Cardiovascular: Normal rate and regular rhythm.   Pulmonary/Chest: Effort normal and breath sounds normal.  Musculoskeletal:  Normal Muscle Bulk and Muscle Testing Reveals: Upper Extremities: Full ROM and Muscle Strength on the Right 4/5 and Left 5/5. Lumbar Paraspinal Tenderness: L-3- L-5 Lower Extremities: Left Full ROM and Muscle Strength 5/5. Flexion Produces Pain into Patella Right Leg Flexion Produces Pain into Patella. Leg wrapped via Therapist wearing her knee brace. Facial Grimacing Noted    Neurological: She is alert and oriented to person, place, and time.  Skin: Skin is warm and dry.  Psychiatric: She has a normal mood and affect.          Assessment & Plan:  1. Right Knee Pain, History of OA and History of knee replacement with revision:  Refilled: MS CONTIN 15 mg one tablet every 12 hours #60 and Oxycodone 10 mg one tablet every 6 hours as needed for pain #120  2. Morbid Obesity:Going to [facility] for Nutritional and Diabetic Classes: Continue to Work on [employer] and weight loss.  3. DM2 with neuropathy: Continue with Voltaren gel and gabapentin  4. ? Concussion: Sent to [facility] ED for evaluation.  30 minutes of face to face patient care time was spent during this visit. All questions were encouraged and answered.   F/U in 1 month

## 2014-01-22 ENCOUNTER — Other Ambulatory Visit: Payer: Self-pay

## 2014-01-23 ENCOUNTER — Encounter: Payer: Medicare HMO | Attending: Family Medicine | Admitting: Dietician

## 2014-01-23 ENCOUNTER — Encounter: Payer: Medicare HMO | Attending: Physical Medicine & Rehabilitation | Admitting: Registered Nurse

## 2014-01-23 ENCOUNTER — Encounter: Payer: Self-pay | Admitting: Registered Nurse

## 2014-01-23 VITALS — BP 133/52 | HR 88 | Resp 16 | Wt 309.0 lb

## 2014-01-23 DIAGNOSIS — M25569 Pain in unspecified knee: Secondary | ICD-10-CM | POA: Diagnosis not present

## 2014-01-23 DIAGNOSIS — F3289 Other specified depressive episodes: Secondary | ICD-10-CM | POA: Insufficient documentation

## 2014-01-23 DIAGNOSIS — M1710 Unilateral primary osteoarthritis, unspecified knee: Secondary | ICD-10-CM | POA: Diagnosis present

## 2014-01-23 DIAGNOSIS — Z713 Dietary counseling and surveillance: Secondary | ICD-10-CM | POA: Insufficient documentation

## 2014-01-23 DIAGNOSIS — Z96659 Presence of unspecified artificial knee joint: Secondary | ICD-10-CM | POA: Diagnosis not present

## 2014-01-23 DIAGNOSIS — E669 Obesity, unspecified: Secondary | ICD-10-CM | POA: Diagnosis not present

## 2014-01-23 DIAGNOSIS — G8929 Other chronic pain: Secondary | ICD-10-CM | POA: Insufficient documentation

## 2014-01-23 DIAGNOSIS — Z5181 Encounter for therapeutic drug level monitoring: Secondary | ICD-10-CM

## 2014-01-23 DIAGNOSIS — Z6841 Body Mass Index (BMI) 40.0 and over, adult: Secondary | ICD-10-CM | POA: Diagnosis not present

## 2014-01-23 DIAGNOSIS — F329 Major depressive disorder, single episode, unspecified: Secondary | ICD-10-CM | POA: Diagnosis not present

## 2014-01-23 DIAGNOSIS — M1711 Unilateral primary osteoarthritis, right knee: Secondary | ICD-10-CM

## 2014-01-23 DIAGNOSIS — F411 Generalized anxiety disorder: Secondary | ICD-10-CM | POA: Insufficient documentation

## 2014-01-23 DIAGNOSIS — Z79899 Other long term (current) drug therapy: Secondary | ICD-10-CM

## 2014-01-23 DIAGNOSIS — M25561 Pain in right knee: Secondary | ICD-10-CM

## 2014-01-23 MED ORDER — MORPHINE SULFATE ER 15 MG PO TBCR: 15.0000 mg | EXTENDED_RELEASE_TABLET | Freq: Two times a day (BID) | ORAL | Status: DC

## 2014-01-23 MED ORDER — OXYCODONE HCL 10 MG PO TABS: 10.0000 mg | ORAL_TABLET | Freq: Four times a day (QID) | ORAL | Status: DC | PRN

## 2014-01-23 NOTE — Progress Notes (Signed)
Subjective:    Patient ID: Caroline Williams, female    DOB: [birth year], 53 y.o.   MRN: [id number]  HPI: Caroline Williams is a 53 year old female who returns for follow up for chronic pain and medication refill. She says her pain is located in her right leg and lower back. She rates her pain 4. She hasn't followed a exercise routine lately. She states she will be going to water aerobics soon. She says her PCP Dr. Chapman Fitch told her she had hematuria and she will be setting up an appointment with a urologist. She is very happy about having an appointment with an orthopedist at [facility] Dr. Halina Maidens for a second opinion. She has been following a healthy diet and been working with a nutritionist she has lost 8 lbs in a month.   Pain Inventory Average Pain 5 Pain Right Now 4 My pain is aching and throbbing  In the last 24 hours, has pain interfered with the following? General activity 6 Relation with others 6 Enjoyment of life 6 What TIME of day is your pain at its worst? morning and night Sleep (in general) varies  Pain is worse with: walking and standing Pain improves with: rest, heat/ice and medication Relief from Meds: 7  Mobility use a cane ability to climb steps?  yes do you drive?  no  Function disabled: date disabled . I need assistance with the following:  dressing, meal prep, household duties and shopping  Neuro/Psych bladder control problems numbness trouble walking  Prior Studies Any changes since last visit?  yes CT/MRI  Physicians involved in your care Any changes since last visit?  no   Family History  Problem Relation Age of Onset  . Cancer Father     colon  . Stroke Father   . Heart disease Father   . Depression Sister    History   Social History  . Marital Status: Single    Spouse Name: N/A    Number of Children: N/A  . Years of Education: N/A   Social History Main Topics  . Smoking status: Current Every Day Smoker -- 0.50 packs/day for 4 years   Types: Cigarettes  . Smokeless tobacco: Never Used  . Alcohol Use: No  . Drug Use: No  . Sexual Activity: Yes    Birth Control/ Protection: None   Other Topics Concern  . None   Social History Narrative  . None   Past Surgical History  Procedure Laterality Date  . Cesarean section    . Breast lumpectomy      right  . Hernia repair    . Appendectomy    . Abdominal hysterectomy      partial  . Knee surgery      right  . Myomectomy abdominal approach  1989  . Myomectomy abdominal approach  1989  . Cardiac catheterization      no significant CAD, nl LV function by 11/08/06 cath  . Total knee revision  06/21/2012    Procedure: TOTAL KNEE REVISION;  Surgeon: Newt Minion, MD;  Location: [place];  Service: Orthopedics;  Laterality: Right;  Revision Right Total Knee Arthroplasty   Past Medical History  Diagnosis Date  . History of knee replacement, total   . Fever   . Breast lump   . Breast discharge   . Breast pain   . Diabetes mellitus   . Thyroid disease   . Asthma   . Hiatal hernia   . Arthritis   .  Depression   . Hypertension   . Bipolar 1 disorder   . Hypothyroidism   . Sleep apnea    BP 133/52  Pulse 88  Resp 16  Wt 309 lb (140.161 kg)  SpO2 99%  Opioid Risk Score:   Fall Risk Score: High Fall Risk (>13 points) (previiously educated and given handout)  Review of Systems  Genitourinary:       Bladder control problems  Musculoskeletal: Positive for gait problem.  Neurological: Positive for numbness.  All other systems reviewed and are negative.      Objective:   Physical Exam  Nursing note and vitals reviewed. Constitutional: She is oriented to person, place, and time. She appears well-developed and well-nourished.  HENT:  Head: Normocephalic and atraumatic.  Neck: Normal range of motion. Neck supple.  Cardiovascular: Normal rate and regular rhythm.   Pulmonary/Chest: Effort normal and breath sounds normal.  Musculoskeletal:  Normal Muscle Bulk  and Muscle testing Reveals: Upper Extremities: Full ROM and Muscle Strength 5/5 Back without Spinal or Paraspinal Tenderness Lower Extremities: Full ROM and Muscle strength 5/5 Right Leg Flexion Produces Pain into Patella/  Right Knee Brace Intact Arises from the Chair with ease Narrow Based Gait   Neurological: She is alert and oriented to person, place, and time.  Skin: Skin is warm and dry.  Psychiatric: She has a normal mood and affect.          Assessment & Plan:  1. Right Knee Pain, History of OA and History of knee replacement with revision:  Refilled: MS CONTIN 15 mg one tablet every 12 hours #60 and Oxycodone 10 mg one tablet every 6 hours as needed for pain #120  2. Morbid Obesity: Has lost 8llbs. Going to [facility] for Nutritional and Diabetic Classes: Continue to Work on [employer] and weight loss.  3. DM2 with neuropathy: Continue with Voltaren gel and gabapentin    20 minutes of face to face patient care time was spent during this visit. All questions were encouraged and answered.   F/U in 1 month

## 2014-01-23 NOTE — Patient Instructions (Addendum)
-  Try cooking with extra virgin olive oil -Try not to eat late at night -Start wearing partial dentures to help chew -Practice pre op goals (see handout) -Keep healthy snacks on hand to help avoid desserts (see snack list) -Focus on protein foods for snacks and meals -Fill up on non-starchy vegetables (any veggie except corn, peas, or potatoes) -Watch portions (practice reading food labels) -Try getting back into water aerobics -Find ways to manage stress without eating   -Have something with protein at every meal and snack -Keep having broth-based soups for lunch several days a week -Continue doing water aerobics! -Try Smart Balance butter -Try seasoning without salt -Try Ort[employer] Kick products  **Goal: To be healthy!!!**

## 2014-01-23 NOTE — Progress Notes (Signed)
  Medical Nutrition Therapy:  Appt start time: 1150 end time:  1220  Follow up:  Ms. Gautney returns today having lost 7 pounds since last visit. She is still considering bariatric surgery through [facility]. She reports that she is able to say no to sweets. Snacking on fruit. Working on portion control. Got a new meter but not checking blood sugars. Has stopped taking blood pressure medicine. Having baked fish and cooking with herbs; having less fried foods.   Wt Readings from Last 3 Encounters:  01/23/14 309 lb (140.161 kg)  12/24/13 315 lb (142.883 kg)  12/19/13 317 lb 1.6 oz (143.836 kg)   Ht Readings from Last 3 Encounters:  12/19/13 5\' 5"  (1.651 m)  11/22/13 5\' 5"  (1.651 m)  11/08/13 5\' 5"  (1.651 m)   There is no weight on file to calculate BMI. @BMIFA @ Normalized weight-for-age data available only for age 70 to 15 years. Normalized stature-for-age data available only for age 70 to 47 years.   Preferred Learning Style:   No preference indicated   Learning Readiness:   Ready   MEDICATIONS: see list (recommended that patient see PCP regarding medication management)   DIETARY INTAKE:  24-hr recall:  B ( AM): Premier protein shake and boiled egg Snk ( AM):  L ( PM): tuna on 6in sub (no chips) Snk ( PM):  D ( PM): sometimes fried chicken; grilled BBQ chicken  Snk ( PM): none  Beverages: water, unsweet tea  Usual physical activity: water aerobics 2x a week; physical therapy 1x a week  Estimated energy needs: 1600 calories 180 g carbohydrates 120 g protein 44 g fat  Progress Towards Goal(s):  No progress.   Nutritional Diagnosis:  [place]-3.3 Overweight/obesity As related to excessive energy intake and physical inactivity.  As evidenced by BMI 54.    Intervention:  Nutrition counseling provided. I answered the patient's questions regarding bariatric surgery. She expressed interest in [facility]  Teaching Method Utilized: Visual Auditory Hands on  Handouts given during  visit include:  Phase 3A lean protein foods  Barriers to learning/adherence to lifestyle change: anxiety and depression  Demonstrated degree of understanding via:  Teach Back   Monitoring/Evaluation:  Dietary intake, exercise, and body weight in 1 month(s).

## 2014-01-28 ENCOUNTER — Encounter (HOSPITAL_COMMUNITY): Payer: Self-pay | Admitting: Emergency Medicine

## 2014-01-28 ENCOUNTER — Emergency Department (HOSPITAL_COMMUNITY): Payer: Medicare HMO

## 2014-01-28 ENCOUNTER — Emergency Department (HOSPITAL_COMMUNITY)
Admission: EM | Admit: 2014-01-28 | Discharge: 2014-01-29 | Disposition: A | Discharge status: Home / Self Care | Payer: Medicare HMO | Attending: Emergency Medicine | Admitting: Emergency Medicine

## 2014-01-28 DIAGNOSIS — I10 Essential (primary) hypertension: Secondary | ICD-10-CM | POA: Insufficient documentation

## 2014-01-28 DIAGNOSIS — Z791 Long term (current) use of non-steroidal anti-inflammatories (NSAID): Secondary | ICD-10-CM | POA: Insufficient documentation

## 2014-01-28 DIAGNOSIS — Z8742 Personal history of other diseases of the female genital tract: Secondary | ICD-10-CM | POA: Diagnosis not present

## 2014-01-28 DIAGNOSIS — E119 Type 2 diabetes mellitus without complications: Secondary | ICD-10-CM | POA: Insufficient documentation

## 2014-01-28 DIAGNOSIS — Z79899 Other long term (current) drug therapy: Secondary | ICD-10-CM | POA: Insufficient documentation

## 2014-01-28 DIAGNOSIS — M129 Arthropathy, unspecified: Secondary | ICD-10-CM | POA: Insufficient documentation

## 2014-01-28 DIAGNOSIS — G8929 Other chronic pain: Secondary | ICD-10-CM | POA: Insufficient documentation

## 2014-01-28 DIAGNOSIS — M25569 Pain in unspecified knee: Secondary | ICD-10-CM | POA: Diagnosis not present

## 2014-01-28 DIAGNOSIS — F319 Bipolar disorder, unspecified: Secondary | ICD-10-CM | POA: Insufficient documentation

## 2014-01-28 DIAGNOSIS — J45909 Unspecified asthma, uncomplicated: Secondary | ICD-10-CM | POA: Diagnosis not present

## 2014-01-28 DIAGNOSIS — M25469 Effusion, unspecified knee: Secondary | ICD-10-CM | POA: Insufficient documentation

## 2014-01-28 DIAGNOSIS — E039 Hypothyroidism, unspecified: Secondary | ICD-10-CM | POA: Insufficient documentation

## 2014-01-28 DIAGNOSIS — F17200 Nicotine dependence, unspecified, uncomplicated: Secondary | ICD-10-CM | POA: Insufficient documentation

## 2014-01-28 DIAGNOSIS — M25561 Pain in right knee: Secondary | ICD-10-CM

## 2014-01-28 LAB — CBG MONITORING, ED: Glucose-Capillary: 87 mg/dL (ref 70–99)

## 2014-01-28 NOTE — ED Notes (Signed)
Per ems-- pt with hx 2 previous knee surgeries last one jan. 2014. On Thursday she started having R knee pain. Painful ROM and weight baring. On Saturday she noticed discoloration to knee. Denies fevers. cbg 119. bp-142/100 hr-86 r-16.

## 2014-01-28 NOTE — ED Notes (Signed)
Pt given ice pack and warm blanket.

## 2014-01-29 LAB — BASIC METABOLIC PANEL
Anion gap: 13 (ref 5–15)
BUN: 12 mg/dL (ref 6–23)
CO2: 26 mEq/L (ref 19–32)
Calcium: 9.4 mg/dL (ref 8.4–10.5)
Chloride: 102 mEq/L (ref 96–112)
Creatinine, Ser: 0.75 mg/dL (ref 0.50–1.10)
GFR calc Af Amer: >90 mL/min (ref >90)
GFR calc non Af Amer: >90 mL/min (ref >90)
Glucose, Bld: 124 mg/dL — ABNORMAL HIGH (ref 70–99)
Potassium: 3.1 mEq/L — ABNORMAL LOW (ref 3.7–5.3)
Sodium: 141 mEq/L (ref 137–147)

## 2014-01-29 LAB — CBC WITH DIFFERENTIAL/PLATELET
Basophils Absolute: 0 10*3/uL (ref 0.0–0.1)
Basophils Relative: 1 % (ref 0–1)
Eosinophils Absolute: 0.2 10*3/uL (ref 0.0–0.7)
Eosinophils Relative: 3 % (ref 0–5)
HCT: 39.8 % (ref 36.0–46.0)
Hemoglobin: 12.9 g/dL (ref 12.0–15.0)
Lymphocytes Relative: 31 % (ref 12–46)
Lymphs Abs: 1.7 10*3/uL (ref 0.7–4.0)
MCH: 25.2 pg — ABNORMAL LOW (ref 26.0–34.0)
MCHC: 32.4 g/dL (ref 30.0–36.0)
MCV: 77.7 fL — ABNORMAL LOW (ref 78.0–100.0)
Monocytes Absolute: 0.3 10*3/uL (ref 0.1–1.0)
Monocytes Relative: 4 % (ref 3–12)
Neutro Abs: 3.5 10*3/uL (ref 1.7–7.7)
Neutrophils Relative %: 61 % (ref 43–77)
Platelets: 302 10*3/uL (ref 150–400)
RBC: 5.12 MIL/uL — ABNORMAL HIGH (ref 3.87–5.11)
RDW: 18 % — ABNORMAL HIGH (ref 11.5–15.5)
WBC: 5.6 10*3/uL (ref 4.0–10.5)

## 2014-01-29 MED ORDER — HYDROMORPHONE HCL PF 1 MG/ML IJ SOLN
1.0000 mg | Freq: Once | INTRAMUSCULAR | Status: AC
  Administered 2014-01-29: 1 mg via INTRAMUSCULAR
  Filled 2014-01-29: qty 1

## 2014-01-29 MED ORDER — POTASSIUM CHLORIDE CRYS ER 20 MEQ PO TBCR
40.0000 meq | EXTENDED_RELEASE_TABLET | Freq: Once | ORAL | Status: AC
  Administered 2014-01-29: 40 meq via ORAL
  Filled 2014-01-29: qty 2

## 2014-01-29 MED ORDER — KETOROLAC TROMETHAMINE 60 MG/2ML IM SOLN
60.0000 mg | Freq: Once | INTRAMUSCULAR | Status: AC
  Administered 2014-01-29: 60 mg via INTRAMUSCULAR
  Filled 2014-01-29: qty 2

## 2014-01-29 NOTE — ED Provider Notes (Signed)
CSN: [id number]     Arrival date & time 01/28/14  1942 History   First MD Initiated Contact with Patient 01/28/14 2356     Chief Complaint  Patient presents with  . Joint Swelling     (Consider location/radiation/quality/duration/timing/severity/associated sxs/prior Treatment) HPI Comments: Patient reports worsening pain and swelling to her right knee. She has chronic right knee pain and takes morphine at home. As well as oxycodone. She says is been replaced twice by Dr. due to. Last surgery was in January 2014. She denies any falls or trauma. Pain is in the medial side of the right knee associated with some hyperpigmented discoloration. She denies any fevers, chills or vomiting. No focal weakness, numbness or tingling. Her pain is severe and uncontrolled by her home medications. She reports being unable to walk for the past 3 days. Denies any pain to her other joints.  The history is provided by the patient and the EMS personnel.    Past Medical History  Diagnosis Date  . History of knee replacement, total   . Fever   . Breast lump   . Breast discharge   . Breast pain   . Diabetes mellitus   . Thyroid disease   . Asthma   . Hiatal hernia   . Arthritis   . Depression   . Hypertension   . Bipolar 1 disorder   . Hypothyroidism   . Sleep apnea    Past Surgical History  Procedure Laterality Date  . Cesarean section    . Breast lumpectomy      right  . Hernia repair    . Appendectomy    . Abdominal hysterectomy      partial  . Knee surgery      right  . Myomectomy abdominal approach  1989  . Myomectomy abdominal approach  1989  . Cardiac catheterization      no significant CAD, nl LV function by 11/08/06 cath  . Total knee revision  06/21/2012    Procedure: TOTAL KNEE REVISION;  Surgeon: Newt Minion, MD;  Location: [place];  Service: Orthopedics;  Laterality: Right;  Revision Right Total Knee Arthroplasty   Family History  Problem Relation Age of Onset  . Cancer Father      colon  . Stroke Father   . Heart disease Father   . Depression Sister    History  Substance Use Topics  . Smoking status: Current Every Day Smoker -- 0.50 packs/day for 4 years    Types: Cigarettes  . Smokeless tobacco: Never Used  . Alcohol Use: No   OB History   Grav Para Term Preterm Abortions TAB SAB Ect Mult Living                 Review of Systems  Constitutional: Negative for fever and activity change.  Respiratory: Negative for cough, chest tightness and shortness of breath.   Cardiovascular: Negative for chest pain and leg swelling.  Gastrointestinal: Negative for nausea and abdominal pain.  Genitourinary: Negative for dysuria and hematuria.  Musculoskeletal: Positive for arthralgias, joint swelling and myalgias.  Skin: Negative for rash.  Neurological: Negative for dizziness, light-headedness and headaches.  A complete 10 system review of systems was obtained and all systems are negative except as noted in the HPI and PMH.      Allergies  Aspirin and Sulfa antibiotics  Home Medications   Prior to Admission medications   Medication Sig Start Date End Date Taking? Authorizing Provider  ADVAIR Randalyn Rhea  500-50 MCG/DOSE AEPB Inhale 1 puff into the lungs 2 (two) times daily.  12/05/13  Yes Historical Provider, MD  albuterol (PROVENTIL HFA;VENTOLIN HFA) 108 (90 BASE) MCG/ACT inhaler Inhale 2 puffs into the lungs every 6 (six) hours as needed for wheezing or shortness of breath. Shortness of breath   Yes Historical Provider, MD  ARIPiprazole (ABILIFY) 5 MG tablet Take 5 mg by mouth at bedtime.    Yes Historical Provider, MD  Asenapine Maleate (SAPHRIS SL) Place 2 tablets under the tongue at bedtime.    Yes Historical Provider, MD  buPROPion (WELLBUTRIN XL) 300 MG 24 hr tablet Take 300 mg by mouth daily.  06/14/13  Yes Historical Provider, MD  Cyanocobalamin (B-12 PO) Take 1 tablet by mouth daily.    Yes Historical Provider, MD  diazepam (VALIUM) 10 MG tablet Take 10 mg by  mouth 3 (three) times daily.   Yes Historical Provider, MD  diclofenac sodium (VOLTAREN) 1 % GEL Apply 2 g topically 3 (three) times daily. Right knee   Yes Historical Provider, MD  fluticasone (FLONASE) 50 MCG/ACT nasal spray Place 2 sprays into the nose daily as needed (congestion).   Yes Historical Provider, MD  gabapentin (NEURONTIN) 600 MG tablet Take 600 mg by mouth 2 (two) times daily.   Yes Historical Provider, MD  ibuprofen (ADVIL,MOTRIN) 800 MG tablet Take 800 mg by mouth every 8 (eight) hours as needed for mild pain.    Yes Historical Provider, MD  levothyroxine (SYNTHROID, LEVOTHROID) 125 MCG tablet Take 125 mcg by mouth daily.    Yes Historical Provider, MD  Lidocaine 0.5 % GEL Apply 1 application topically 2 (two) times daily. Right knee   Yes Historical Provider, MD  losartan-hydrochlorothiazide (HYZAAR) 100-25 MG per tablet Take 1 tablet by mouth daily.  05/04/13  Yes Historical Provider, MD  metFORMIN (GLUCOPHAGE) 1000 MG tablet Take 1,000 mg by mouth 2 (two) times daily with a meal.   Yes Historical Provider, MD  methylphenidate (RITALIN) 20 MG tablet Take 20 mg by mouth 2 (two) times daily with breakfast and lunch.  06/14/13  Yes Historical Provider, MD  morphine (MS CONTIN) 15 MG 12 hr tablet Take 1 tablet (15 mg total) by mouth every 12 (twelve) hours. 01/23/14  Yes Danella Sensing, NP  Multiple Vitamin (MULTIVITAMIN) tablet Take 1 tablet by mouth daily.   Yes Historical Provider, MD  nabumetone (RELAFEN) 750 MG tablet Take 750 mg by mouth 2 (two) times daily as needed for mild pain.  10/04/13  Yes Historical Provider, MD  omeprazole (PRILOSEC) 40 MG capsule Take 40 mg by mouth daily as needed (acid reflex).    Yes Historical Provider, MD  Oxycodone HCl 10 MG TABS Take 1 tablet (10 mg total) by mouth every 6 (six) hours as needed (for pain). 01/23/14  Yes Danella Sensing, NP  rosuvastatin (CRESTOR) 20 MG tablet Take 20 mg by mouth daily.   Yes Historical Provider, MD  ZETIA 10 MG tablet  Take 10 mg by mouth daily.  05/03/13  Yes Historical Provider, MD   BP 166/99  Pulse 66  Temp(Src) 98.9 F (37.2 C) (Oral)  Resp 18  Ht 5\' 5"  (1.651 m)  Wt 308 lb (139.708 kg)  BMI 51.25 kg/m2  SpO2 95% Physical Exam  Nursing note and vitals reviewed. Constitutional: She is oriented to person, place, and time. She appears well-developed and well-nourished. No distress.  HENT:  Head: Normocephalic and atraumatic.  Mouth/Throat: Oropharynx is clear and moist. No oropharyngeal exudate.  Eyes: Conjunctivae and EOM are normal. Pupils are equal, round, and reactive to light.  Neck: Normal range of motion. Neck supple.  No meningismus.  Cardiovascular: Normal rate, regular rhythm, normal heart sounds and intact distal pulses.   No murmur heard. Pulmonary/Chest: Effort normal and breath sounds normal. No respiratory distress.  Abdominal: Soft. There is no tenderness. There is no rebound and no guarding.  Musculoskeletal: Normal range of motion. She exhibits tenderness. She exhibits no edema.  Well healed incision.  TTP medial aspect with small effusion.  No warmth, or erythema.  FROM of flexion and extension, no ligament laxity. Hyperpigmented discoloration to medial side of right knee  In irregular distribution  Neurological: She is alert and oriented to person, place, and time. No cranial nerve deficit. She exhibits normal muscle tone. Coordination normal.  No ataxia on finger to nose bilaterally. No pronator drift. 5/5 strength throughout. CN 2-12 intact. Negative Romberg. Equal grip strength. Sensation intact. Gait is normal.   Skin: Skin is warm.  Psychiatric: She has a normal mood and affect. Her behavior is normal.    ED Course  Procedures (including critical care time) Labs Review Labs Reviewed  CBC WITH DIFFERENTIAL - Abnormal; Notable for the following:    RBC 5.12 (*)    MCV 77.7 (*)    MCH 25.2 (*)    RDW 18.0 (*)    All other components within normal limits  BASIC  METABOLIC PANEL - Abnormal; Notable for the following:    Potassium 3.1 (*)    Glucose, Bld 124 (*)    All other components within normal limits  CBG MONITORING, ED    Imaging Review Dg Knee Complete 4 Views Right  01/28/2014   CLINICAL DATA:  Soreness and discoloration of the knee 4 days ago. Pain 3 days ago. No known injury.  EXAM: RIGHT KNEE - COMPLETE 4+ VIEW  COMPARISON:  12/24/2013, 08/22/2013, 04/13/2013  FINDINGS: Patient has had prior total knee arthroplasty. At the interface of the tibia with the prosthesis, there continues to be some lucency, suggesting loosening or possible infection but the appearance is stable. Linear lucencies along the medial aspect of the proximal tibia may be postsurgical or indicate old fractures. Small effusion is present.  IMPRESSION: 1. Knee arthroplasty status post revision. 2. Joint effusion. 3. Stable appearance of the knee compare with multiple prior studies.   Electronically Signed   By: Shon Hale M.D.   On: 01/28/2014 20:48     EKG Interpretation None      MDM   Final diagnoses:  Knee pain, chronic, right   Acute on chronic knee pain without trauma. No fever. Full range of motion. No warmth or erythema. Doubt septic joint.  X-ray shows chronic changes. Stable lucency of prosthesis.  Pain controlled with IV Dilaudid. Patient able to ambulate with assistance which is her baseline. States she is feeling better. She has followup with orthopedics at [place] next week. Return precautions discussed.  Ezequiel Essex, MD 01/29/14 [phone]

## 2014-01-29 NOTE — Discharge Instructions (Signed)
Arthralgia Followup with your knee specialist at [facility] next week as scheduled. Return to the ED if you develop fever, redness to the knee or any other concerns. Your caregiver has diagnosed you as suffering from an arthralgia. Arthralgia means there is pain in a joint. This can come from many reasons including:  Bruising the joint which causes soreness (inflammation) in the joint.  Wear and tear on the joints which occur as we grow older (osteoarthritis).  Overusing the joint.  Various forms of arthritis.  Infections of the joint. Regardless of the cause of pain in your joint, most of these different pains respond to anti-inflammatory drugs and rest. The exception to this is when a joint is infected, and these cases are treated with antibiotics, if it is a bacterial infection. HOME CARE INSTRUCTIONS   Rest the injured area for as long as directed by your caregiver. Then slowly start using the joint as directed by your caregiver and as the pain allows. Crutches as directed may be useful if the ankles, knees or hips are involved. If the knee was splinted or casted, continue use and care as directed. If an stretchy or elastic wrapping bandage has been applied today, it should be removed and re-applied every 3 to 4 hours. It should not be applied tightly, but firmly enough to keep swelling down. Watch toes and feet for swelling, bluish discoloration, coldness, numbness or excessive pain. If any of these problems (symptoms) occur, remove the ace bandage and re-apply more loosely. If these symptoms persist, contact your caregiver or return to this location.  For the first 24 hours, keep the injured extremity elevated on pillows while lying down.  Apply ice for 15-20 minutes to the sore joint every couple hours while awake for the first half day. Then 03-04 times per day for the first 48 hours. Put the ice in a plastic bag and place a towel between the bag of ice and your skin.  Wear any  splinting, casting, elastic bandage applications, or slings as instructed.  Only take over-the-counter or prescription medicines for pain, discomfort, or fever as directed by your caregiver. Do not use aspirin immediately after the injury unless instructed by your physician. Aspirin can cause increased bleeding and bruising of the tissues.  If you were given crutches, continue to use them as instructed and do not resume weight bearing on the sore joint until instructed. Persistent pain and inability to use the sore joint as directed for more than 2 to 3 days are warning signs indicating that you should see a caregiver for a follow-up visit as soon as possible. Initially, a hairline fracture (break in bone) may not be evident on X-rays. Persistent pain and swelling indicate that further evaluation, non-weight bearing or use of the joint (use of crutches or slings as instructed), or further X-rays are indicated. X-rays may sometimes not show a small fracture until a week or 10 days later. Make a follow-up appointment with your own caregiver or one to whom we have referred you. A radiologist (specialist in reading X-rays) may read your X-rays. Make sure you know how you are to obtain your X-ray results. Do not assume everything is normal if you do not hear from [place]. SEEK MEDICAL CARE IF: Bruising, swelling, or pain increases. SEEK IMMEDIATE MEDICAL CARE IF:   Your fingers or toes are numb or blue.  The pain is not responding to medications and continues to stay the same or get worse.  The pain in  your joint becomes severe.  You develop a fever over 102 F (38.9 C).  It becomes impossible to move or use the joint. MAKE SURE YOU:   Understand these instructions.  Will watch your condition.  Will get help right away if you are not doing well or get worse. Document Released: 05/31/2005 Document Revised: 08/23/2011 Document Reviewed: 01/17/2008 [facility] Patient Information 2015 [place], [place].  This information is not intended to replace advice given to you by your health care provider. Make sure you discuss any questions you have with your health care provider.

## 2014-02-21 ENCOUNTER — Encounter: Payer: Medicare HMO | Attending: Family Medicine | Admitting: Dietician

## 2014-02-21 DIAGNOSIS — Z6841 Body Mass Index (BMI) 40.0 and over, adult: Secondary | ICD-10-CM | POA: Diagnosis not present

## 2014-02-21 DIAGNOSIS — F3289 Other specified depressive episodes: Secondary | ICD-10-CM | POA: Insufficient documentation

## 2014-02-21 DIAGNOSIS — E669 Obesity, unspecified: Secondary | ICD-10-CM | POA: Insufficient documentation

## 2014-02-21 DIAGNOSIS — Z713 Dietary counseling and surveillance: Secondary | ICD-10-CM | POA: Insufficient documentation

## 2014-02-21 DIAGNOSIS — F411 Generalized anxiety disorder: Secondary | ICD-10-CM | POA: Insufficient documentation

## 2014-02-21 DIAGNOSIS — F329 Major depressive disorder, single episode, unspecified: Secondary | ICD-10-CM | POA: Diagnosis not present

## 2014-02-21 DIAGNOSIS — Z96659 Presence of unspecified artificial knee joint: Secondary | ICD-10-CM | POA: Diagnosis not present

## 2014-02-21 NOTE — Patient Instructions (Addendum)
-  Try cooking with extra virgin olive oil -Try not to eat late at night -Start wearing partial dentures to help chew -Practice pre op goals (see handout) -Keep healthy snacks on hand to help avoid desserts (see snack list) -Focus on protein foods for snacks and meals -Fill up on non-starchy vegetables (any veggie except corn, peas, or potatoes) -Watch portions (practice reading food labels) -Try getting back into water aerobics -Find ways to manage stress without eating   -Have something with protein at every meal and snack -Keep having broth-based soups for lunch several days a week -Continue doing water aerobics! -Try Smart Balance butter -Try seasoning without salt -Try [employer] Kick products  **Goal: To be healthy!!!**  New goal: 290-295 lbs

## 2014-02-21 NOTE — Progress Notes (Signed)
  Medical Nutrition Therapy:  Appt start time: 410 end time: 425  Follow up: Caroline Williams has lost 10 pounds since her last visit 1 month ago. She is excited about her weight loss and motivated to continue. She states that she is still weighing herself every day. She is continuing to pursue gastric bypass. Drinking Premier protein shakes and practicing reading labels. She has seen a psychiatrist in preparation for surgery. Shandon reports that she is not skipping meals and drinking a lot of water. She has lost a total of 27 pounds since May 2015.    Wt Readings from Last 3 Encounters:  02/21/14 299 lb 9.6 oz (135.898 kg)  01/28/14 308 lb (139.708 kg)  01/23/14 308 lb 1.6 oz (139.753 kg)   Ht Readings from Last 3 Encounters:  01/28/14 5\' 5"  (1.651 m)  12/19/13 5\' 5"  (1.651 m)  11/22/13 5\' 5"  (1.651 m)   There is no weight on file to calculate BMI. @BMIFA @ Normalized weight-for-age data available only for age 76 to 17 years. Normalized stature-for-age data available only for age 76 to 7 years.   Preferred Learning Style:   No preference indicated   Learning Readiness:   Ready   MEDICATIONS: see list (recommended that patient see PCP regarding medication management)   DIETARY INTAKE:  24-hr recall:  B ( AM): Premier protein shake and boiled egg Snk ( AM):  L ( PM): tuna on 6in sub (no chips) Snk ( PM):  D ( PM): sometimes fried chicken; grilled BBQ chicken  Snk ( PM): none  Beverages: water, unsweet tea  Usual physical activity: water aerobics 2x a week; physical therapy 1x a week  Estimated energy needs: 1600 calories 180 g carbohydrates 120 g protein 44 g fat  Progress Towards Goal(s):  No progress.   Nutritional Diagnosis:  [place]-3.3 Overweight/obesity As related to excessive energy intake and physical inactivity.  As evidenced by BMI 54.    Intervention:  Nutrition counseling provided. I answered the patient's questions regarding bariatric surgery. She expressed  interest in RYGB  Teaching Method Utilized: Visual Auditory Hands on   Barriers to learning/adherence to lifestyle change: knee pain  Demonstrated degree of understanding via:  Teach Back   Monitoring/Evaluation:  Dietary intake, exercise, and body weight in 1 month(s).

## 2014-02-22 ENCOUNTER — Other Ambulatory Visit: Payer: Self-pay

## 2014-02-22 ENCOUNTER — Encounter: Payer: Medicare HMO | Attending: Physical Medicine & Rehabilitation | Admitting: Registered Nurse

## 2014-02-22 ENCOUNTER — Encounter: Payer: Self-pay | Admitting: Registered Nurse

## 2014-02-22 ENCOUNTER — Encounter: Payer: Medicare HMO | Admitting: Registered Nurse

## 2014-02-22 ENCOUNTER — Ambulatory Visit: Payer: Medicaid Other | Admitting: Dietician

## 2014-02-22 VITALS — BP 154/69 | HR 95 | Resp 16 | Ht 65.0 in | Wt 301.0 lb

## 2014-02-22 DIAGNOSIS — G8929 Other chronic pain: Secondary | ICD-10-CM | POA: Diagnosis present

## 2014-02-22 DIAGNOSIS — M1710 Unilateral primary osteoarthritis, unspecified knee: Secondary | ICD-10-CM | POA: Diagnosis present

## 2014-02-22 DIAGNOSIS — M1711 Unilateral primary osteoarthritis, right knee: Secondary | ICD-10-CM

## 2014-02-22 DIAGNOSIS — M25569 Pain in unspecified knee: Secondary | ICD-10-CM | POA: Diagnosis not present

## 2014-02-22 DIAGNOSIS — Z79899 Other long term (current) drug therapy: Secondary | ICD-10-CM

## 2014-02-22 DIAGNOSIS — Z5181 Encounter for therapeutic drug level monitoring: Secondary | ICD-10-CM

## 2014-02-22 DIAGNOSIS — M25561 Pain in right knee: Secondary | ICD-10-CM

## 2014-02-22 MED ORDER — MORPHINE SULFATE ER 15 MG PO TBCR: 15.0000 mg | EXTENDED_RELEASE_TABLET | Freq: Two times a day (BID) | ORAL | Status: DC

## 2014-02-22 MED ORDER — OXYCODONE HCL 10 MG PO TABS: 10.0000 mg | ORAL_TABLET | Freq: Four times a day (QID) | ORAL | Status: DC | PRN

## 2014-02-22 NOTE — Progress Notes (Signed)
Subjective:    Patient ID: Caroline Williams, female    DOB: 09/09/1960, 53 y.o.   MRN: [id number]  HPI: [name] is a 53 year old female who returns for follow up for chronic pain and medication refill. She says her pain is located in her right knee and lower back. She rates her pain 7. Her current exercise regime is using her stationary bicycle twice a week for 20 minutes. She went to [facility] on 01/28/14 for Pian in her Right Knee, she received dilaudid. She stayed overnight and was discharged home. On 02/28/2014 she will be going to [facility] for second  opinion about her knee and  on 03/05/14 for her back.  Pain Inventory Average Pain 7 Pain Right Now 7 My pain is na  In the last 24 hours, has pain interfered with the following? General activity 10 Relation with others 10 Enjoyment of life 10 What TIME of day is your pain at its worst? morning,datime, night Sleep (in general) Poor  Pain is worse with: walking, inactivity, standing and some activites Pain improves with: rest, heat/ice, therapy/exercise, medication and TENS Relief from Meds: 6  Mobility walk with assistance use a cane use a walker how many minutes can you walk? 5-10 ability to climb steps?  no do you drive?  no use a wheelchair Do you have any goals in this area?  yes  Function Do you have any goals in this area?  no  Neuro/Psych bladder control problems numbness trouble walking depression  Prior Studies Any changes since last visit?  no  Physicians involved in your care Any changes since last visit?  no   Family History  Problem Relation Age of Onset  . Cancer Father     colon  . Stroke Father   . Heart disease Father   . Depression Sister    History   Social History  . Marital Status: Single    Spouse Name: N/A    Number of Children: N/A  . Years of Education: N/A   Social History Main Topics  . Smoking status: Current Every Day Smoker -- 0.50 packs/day for 4 years   Types: Cigarettes  . Smokeless tobacco: Never Used  . Alcohol Use: No  . Drug Use: No  . Sexual Activity: Yes    Birth Control/ Protection: None   Other Topics Concern  . None   Social History Narrative  . None   Past Surgical History  Procedure Laterality Date  . Cesarean section    . Breast lumpectomy      right  . Hernia repair    . Appendectomy    . Abdominal hysterectomy      partial  . Knee surgery      right  . Myomectomy abdominal approach  1989  . Myomectomy abdominal approach  1989  . Cardiac catheterization      no significant CAD, nl LV function by 11/08/06 cath  . Total knee revision  06/21/2012    Procedure: TOTAL KNEE REVISION;  Surgeon: Newt Minion, MD;  Location: [place];  Service: Orthopedics;  Laterality: Right;  Revision Right Total Knee Arthroplasty   Past Medical History  Diagnosis Date  . History of knee replacement, total   . Fever   . Breast lump   . Breast discharge   . Breast pain   . Diabetes mellitus   . Thyroid disease   . Asthma   . Hiatal hernia   . Arthritis   .  Depression   . Hypertension   . Bipolar 1 disorder   . Hypothyroidism   . Sleep apnea    BP 154/69  Pulse 95  Resp 16  Ht 5\' 5"  (1.651 m)  Wt 301 lb (136.533 kg)  BMI 50.09 kg/m2  SpO2 97%  Opioid Risk Score:   Fall Risk Score:      Review of Systems  Constitutional: Positive for unexpected weight change.  Respiratory: Positive for apnea and wheezing.   Cardiovascular: Positive for leg swelling.  Endocrine:       High blood sugar  Genitourinary:       Bladder control problems  Musculoskeletal: Positive for gait problem.  Neurological: Positive for numbness.  Psychiatric/Behavioral:       Depression  All other systems reviewed and are negative.      Objective:   Physical Exam  Nursing note and vitals reviewed. Constitutional: She is oriented to person, place, and time. She appears well-developed and well-nourished.  HENT:  Head: Normocephalic and  atraumatic.  Neck: Normal range of motion. Neck supple.  Cardiovascular: Normal rate and regular rhythm.   Pulmonary/Chest: Effort normal and breath sounds normal.  Musculoskeletal:  Normal Muscle Bulk and Muscle Testing Reveals: Upper Extremities: Full ROM and Muscle Strength 5/5 Thoracic Paraspinal Tenderness: T-10- T-12 Lower Extremities: Full ROM and Muscle Strength 5/5 Right Knee Brace intact Arises from chair with ease/ Using straight cane/ Wide Based Gait    Neurological: She is alert and oriented to person, place, and time.  Skin: Skin is warm and dry.  Psychiatric: She has a normal mood and affect.          Assessment & Plan:  1. Right Knee Pain, History of OA and History of knee replacement with revision:  Refilled: MS CONTIN 15 mg one tablet every 12 hours #60 and Oxycodone 10 mg one tablet every 6 hours as needed for pain #120  2. Morbid Obesity: Has lost 30llbs. Going to [facility] for Nutritional and Diabetic Classes: Continue to Work on [employer] and weight loss.  3. DM2 with neuropathy: Continue with Voltaren gel and gabapentin   20 minutes of face to face patient care time was spent during this visit. All questions were encouraged and answered.   F/U in 1 month

## 2014-02-26 ENCOUNTER — Ambulatory Visit: Payer: Commercial Managed Care - HMO | Admitting: Dietician

## 2014-03-21 ENCOUNTER — Encounter: Payer: Commercial Managed Care - HMO | Attending: Family Medicine | Admitting: Dietician

## 2014-03-21 DIAGNOSIS — Z713 Dietary counseling and surveillance: Secondary | ICD-10-CM | POA: Diagnosis not present

## 2014-03-21 DIAGNOSIS — Z6841 Body Mass Index (BMI) 40.0 and over, adult: Secondary | ICD-10-CM | POA: Diagnosis not present

## 2014-03-21 NOTE — Progress Notes (Signed)
  Medical Nutrition Therapy:  Appt start time: 1000 end time: 1015  SWL: Caroline Williams returns for supervised weight loss having lost another 1.5 lbs. She states that her blood sugars have been good. She continues to be motivated to continue losing weight. When her boyfriend cooks fried chicken she will bake her own chicken or just have spinach.    Nutrition assessment letter was completed and faxed to bariatric coordinator.   Wt Readings from Last 3 Encounters:  03/21/14 298 lb 1.6 oz (135.217 kg)  02/22/14 301 lb (136.533 kg)  02/21/14 299 lb 9.6 oz (135.898 kg)   Ht Readings from Last 3 Encounters:  02/22/14 5\' 5"  (1.651 m)  01/28/14 5\' 5"  (1.651 m)  12/19/13 5\' 5"  (1.651 m)   There is no weight on file to calculate BMI. @BMIFA @ Normalized weight-for-age data available only for age 21 to 53 years. Normalized stature-for-age data available only for age 21 to 2 years.   Preferred Learning Style:   No preference indicated   Learning Readiness:   Ready   MEDICATIONS: see list (recommended that patient see PCP regarding medication management)   DIETARY INTAKE:  24-hr recall:  B ( AM): Premier protein shake and boiled egg Snk ( AM):  L ( PM): tuna on 6in sub (no chips) Snk ( PM):  D ( PM): sometimes fried chicken; grilled BBQ chicken  Snk ( PM): none  Beverages: water, unsweet tea  Usual physical activity: water aerobics 2x a week; physical therapy 1x a week  Estimated energy needs: 1600 calories 180 g carbohydrates 120 g protein 44 g fat  Progress Towards Goal(s):  In progress.   Nutritional Diagnosis:  [place]-3.3 Overweight/obesity As related to excessive energy intake and physical inactivity.  As evidenced by BMI 54.    Intervention:  Nutrition counseling provided. I answered the patient's questions regarding bariatric surgery. She expressed interest in RYGB  Handout provided: low sodium flavoring tips  Teaching Method Utilized: Visual Auditory  Barriers to  learning/adherence to lifestyle change: knee pain  Demonstrated degree of understanding via:  Teach Back   Monitoring/Evaluation:  Dietary intake, exercise, and body weight in 1 month(s).

## 2014-03-21 NOTE — Patient Instructions (Signed)
-  Try cooking with extra virgin olive oil -Try not to eat late at night -Start wearing partial dentures to help chew -Practice pre op goals (see handout) -Keep healthy snacks on hand to help avoid desserts (see snack list) -Focus on protein foods for snacks and meals -Fill up on non-starchy vegetables (any veggie except corn, peas, or potatoes) -Watch portions (practice reading food labels) -Try getting back into water aerobics -Find ways to manage stress without eating   -Have something with protein at every meal and snack -Keep having broth-based soups for lunch several days a week -Continue doing water aerobics! -Try Smart Balance butter -Try seasoning without salt -Try [employer] Kick products  **Goal: To be healthy!!!**  New goal: 290-295 lbs

## 2014-03-22 ENCOUNTER — Encounter: Payer: Self-pay | Admitting: Registered Nurse

## 2014-03-22 ENCOUNTER — Encounter: Payer: Medicare HMO | Attending: Physical Medicine & Rehabilitation | Admitting: Registered Nurse

## 2014-03-22 VITALS — BP 135/66 | HR 86 | Resp 14 | Ht 65.0 in | Wt 297.0 lb

## 2014-03-22 DIAGNOSIS — M1711 Unilateral primary osteoarthritis, right knee: Secondary | ICD-10-CM | POA: Insufficient documentation

## 2014-03-22 DIAGNOSIS — Z79899 Other long term (current) drug therapy: Secondary | ICD-10-CM

## 2014-03-22 DIAGNOSIS — M25561 Pain in right knee: Secondary | ICD-10-CM

## 2014-03-22 DIAGNOSIS — G8929 Other chronic pain: Secondary | ICD-10-CM | POA: Insufficient documentation

## 2014-03-22 DIAGNOSIS — Z5181 Encounter for therapeutic drug level monitoring: Secondary | ICD-10-CM

## 2014-03-22 MED ORDER — METHYLPREDNISOLONE 4 MG PO KIT: PACK | ORAL | Status: DC

## 2014-03-22 MED ORDER — OXYCODONE HCL 10 MG PO TABS: 10.0000 mg | ORAL_TABLET | Freq: Four times a day (QID) | ORAL | Status: DC | PRN

## 2014-03-22 MED ORDER — MORPHINE SULFATE ER 15 MG PO TBCR: 15.0000 mg | EXTENDED_RELEASE_TABLET | Freq: Two times a day (BID) | ORAL | Status: DC

## 2014-03-22 NOTE — Progress Notes (Signed)
Subjective:    Patient ID: Caroline Williams, female    DOB: 04-29-1961, 53 y.o.   MRN: [id number] HPI: [name] is a 53 year old female who returns for follow up for chronic pain and medication refill. She says her pain is located in her right knee and lower back. She rates her pain 6. Her current exercise regime is using her stationary bicycle twice a week for 20 minutes and walking.  She went to [facility] for second opinion about her knee and back. She will be following up with spine specialist on October 22nd.  Pain Inventory Average Pain 6 Pain Right Now 6 My pain is constant, dull and aching  In the last 24 hours, has pain interfered with the following? General activity 7 Relation with others 7 Enjoyment of life 9 What TIME of day is your pain at its worst? morning, daytime and evening Sleep (in general) Poor  Pain is worse with: walking, inactivity and standing Pain improves with: rest, heat/ice, medication and TENS Relief from Meds: 7  Mobility use a cane  Function disabled: date disabled .  Neuro/Psych numbness trouble walking  Prior Studies Any changes since last visit?  no  Physicians involved in your care Any changes since last visit?  no   Family History  Problem Relation Age of Onset  . Cancer Father     colon  . Stroke Father   . Heart disease Father   . Depression Sister    History   Social History  . Marital Status: Single    Spouse Name: N/A    Number of Children: N/A  . Years of Education: N/A   Social History Main Topics  . Smoking status: Current Every Day Smoker -- 0.50 packs/day for 4 years    Types: Cigarettes  . Smokeless tobacco: Never Used  . Alcohol Use: No  . Drug Use: No  . Sexual Activity: Yes    Birth Control/ Protection: None   Other Topics Concern  . None   Social History Narrative  . None   Past Surgical History  Procedure Laterality Date  . Cesarean section    . Breast lumpectomy      right  . Hernia  repair    . Appendectomy    . Abdominal hysterectomy      partial  . Knee surgery      right  . Myomectomy abdominal approach  1989  . Myomectomy abdominal approach  1989  . Cardiac catheterization      no significant CAD, nl LV function by 11/08/06 cath  . Total knee revision  06/21/2012    Procedure: TOTAL KNEE REVISION;  Surgeon: Newt Minion, MD;  Location: [place];  Service: Orthopedics;  Laterality: Right;  Revision Right Total Knee Arthroplasty   Past Medical History  Diagnosis Date  . History of knee replacement, total   . Fever   . Breast lump   . Breast discharge   . Breast pain   . Diabetes mellitus   . Thyroid disease   . Asthma   . Hiatal hernia   . Arthritis   . Depression   . Hypertension   . Bipolar 1 disorder   . Hypothyroidism   . Sleep apnea    BP 135/66  Pulse 86  Resp 14  Ht 5\' 5"  (1.651 m)  Wt 297 lb (134.718 kg)  BMI 49.42 kg/m2  SpO2 98%  Opioid Risk Score:   Fall Risk Score: Moderate Fall  Risk (6-13 points)   Review of Systems     Objective:   Physical Exam  Nursing note and vitals reviewed. Constitutional: She is oriented to person, place, and time. She appears well-developed and well-nourished.  HENT:  Head: Normocephalic and atraumatic.  Neck: Normal range of motion. Neck supple.  Cardiovascular: Normal rate and regular rhythm.   Pulmonary/Chest: Effort normal and breath sounds normal.  Musculoskeletal:  Normal Muscle Bulk and Muscle Testing Reveals: Upper Extremities: Full ROM and Muscle Strength 5/5 Lumbar Paraspinal Tenderness: L-3- L-5 Lower Extremities: Full ROM and Muscle Strength 5/5 Right Leg Flexion Produces Pain into Patella Knee Brace Intact Arises from Chair with Ease/ Using Straight Cane for Support Wide Based gait  Neurological: She is alert and oriented to person, place, and time.  Skin: Skin is warm and dry.  Psychiatric: She has a normal mood and affect.          Assessment & Plan:  1. Right Knee Pain,  History of OA and History of knee replacement with revision:  Refilled: MS CONTIN 15 mg one tablet every 12 hours #60 and Oxycodone 10 mg one tablet every 6 hours as needed for pain #120  2. Morbid Obesity: Has lost 34llbs. Going to [employer] for Nutritional and Diabetic Classes: Continue to Work on [employer] and weight loss.  3. DM2 with neuropathy: Continue with Voltaren gel and gabapentin  20 minutes of face to face patient care time was spent during this visit. All questions were encouraged and answered.  F/U in 1 month

## 2014-04-19 ENCOUNTER — Encounter: Payer: Commercial Managed Care - HMO | Attending: Physical Medicine & Rehabilitation | Admitting: Registered Nurse

## 2014-04-19 ENCOUNTER — Encounter: Payer: Self-pay | Admitting: Registered Nurse

## 2014-04-19 ENCOUNTER — Other Ambulatory Visit: Payer: Self-pay | Admitting: Physical Medicine & Rehabilitation

## 2014-04-19 VITALS — BP 135/59 | HR 89 | Resp 14 | Ht 65.0 in | Wt 297.0 lb

## 2014-04-19 DIAGNOSIS — G894 Chronic pain syndrome: Secondary | ICD-10-CM

## 2014-04-19 DIAGNOSIS — G8929 Other chronic pain: Secondary | ICD-10-CM | POA: Diagnosis present

## 2014-04-19 DIAGNOSIS — Z5181 Encounter for therapeutic drug level monitoring: Secondary | ICD-10-CM

## 2014-04-19 DIAGNOSIS — Z79899 Other long term (current) drug therapy: Secondary | ICD-10-CM

## 2014-04-19 DIAGNOSIS — M25561 Pain in right knee: Secondary | ICD-10-CM | POA: Insufficient documentation

## 2014-04-19 DIAGNOSIS — M1711 Unilateral primary osteoarthritis, right knee: Secondary | ICD-10-CM

## 2014-04-19 MED ORDER — OXYCODONE HCL 10 MG PO TABS: 10.0000 mg | ORAL_TABLET | Freq: Four times a day (QID) | ORAL | Status: DC | PRN

## 2014-04-19 MED ORDER — MORPHINE SULFATE ER 15 MG PO TBCR: 15.0000 mg | EXTENDED_RELEASE_TABLET | Freq: Two times a day (BID) | ORAL | Status: DC

## 2014-04-19 NOTE — Progress Notes (Signed)
Subjective:    Patient ID: Caroline Williams, female    DOB: 01/06/1961, 53 y.o.   MRN: [id number]  HPI: [name] is a 53 year old female who returns for follow up for chronic pain and medication refill. She says her pain is located in her right knee. She rates her pain 7. Her current exercise regime is using her stationary bicycle three times a week, walking and attending [employer] three times a week for an hour. She had  MRI on her Lumbar at [facility].  Pain Inventory Average Pain 7 Pain Right Now 7 My pain is aching  In the last 24 hours, has pain interfered with the following? General activity 7 Relation with others 7 Enjoyment of life 7 What TIME of day is your pain at its worst? morning,daytime, evening Sleep (in general) Fair  Pain is worse with: walking, inactivity and some activites Pain improves with: heat/ice, medication and TENS Relief from Meds: 8  Mobility use a cane use a walker how many minutes can you walk? 10 ability to climb steps?  no do you drive?  no Do you have any goals in this area?  yes  Function disabled: date disabled 71  Neuro/Psych numbness depression anxiety  Prior Studies Any changes since last visit?  no  Physicians involved in your care Any changes since last visit?  no   Family History  Problem Relation Age of Onset  . Cancer Father     colon  . Stroke Father   . Heart disease Father   . Depression Sister    History   Social History  . Marital Status: Single    Spouse Name: N/A    Number of Children: N/A  . Years of Education: N/A   Social History Main Topics  . Smoking status: Current Every Day Smoker -- 0.50 packs/day for 4 years    Types: Cigarettes  . Smokeless tobacco: Never Used  . Alcohol Use: No  . Drug Use: No  . Sexual Activity: Yes    Birth Control/ Protection: None   Other Topics Concern  . None   Social History Narrative   Past Surgical History  Procedure Laterality Date  . Cesarean section    .  Breast lumpectomy      right  . Hernia repair    . Appendectomy    . Abdominal hysterectomy      partial  . Knee surgery      right  . Myomectomy abdominal approach  1989  . Myomectomy abdominal approach  1989  . Cardiac catheterization      no significant CAD, nl LV function by 11/08/06 cath  . Total knee revision  06/21/2012    Procedure: TOTAL KNEE REVISION;  Surgeon: Newt Minion, MD;  Location: [place];  Service: Orthopedics;  Laterality: Right;  Revision Right Total Knee Arthroplasty   Past Medical History  Diagnosis Date  . History of knee replacement, total   . Fever   . Breast lump   . Breast discharge   . Breast pain   . Diabetes mellitus   . Thyroid disease   . Asthma   . Hiatal hernia   . Arthritis   . Depression   . Hypertension   . Bipolar 1 disorder   . Hypothyroidism   . Sleep apnea    BP 135/59 mmHg  Pulse 89  Resp 14  Ht 5\' 5"  (1.651 m)  Wt 297 lb (134.718 kg)  BMI 49.42 kg/m2  SpO2 100%  Opioid Risk Score:   Fall Risk Score: Moderate Fall Risk (6-13 points)  Review of Systems     Objective:   Physical Exam  Constitutional: She is oriented to person, place, and time. She appears well-developed and well-nourished.  HENT:  Head: Normocephalic and atraumatic.  Neck: Normal range of motion. Neck supple.  Cardiovascular: Normal rate and regular rhythm.   Pulmonary/Chest: Effort normal and breath sounds normal.  Musculoskeletal:  Normal Muscle Bulk and Muscle testing reveals:  Upper extremities: Full ROM and Muscle strength 5/5 Back without spinal or paraspinal tenderness Lower Extremities: Full ROM and Muscle Strength 5/5 Right Knee Flexion Produces Pain into Patella Right Knee sleeve intact Arises from chair with ease/ Using Straight cane for support  Neurological: She is alert and oriented to person, place, and time.  Skin: Skin is warm and dry.  Psychiatric: She has a normal mood and affect.  Nursing note and vitals reviewed.           Assessment & Plan:  1. Right Knee Pain, History of OA and History of knee replacement with revision:  Refilled: MS CONTIN 15 mg one tablet every 12 hours #60 and Oxycodone 10 mg one tablet every 6 hours as needed for pain #120  2. Morbid Obesity: Continue to Work on [employer] and weight loss.  3. DM2 with neuropathy: Continue with Voltaren gel and gabapentin   20 minutes of face to face patient care time was spent during this visit. All questions were encouraged and answered.  F/U in 1 month

## 2014-04-20 LAB — PMP ALCOHOL METABOLITE (ETG)

## 2014-04-23 LAB — BENZODIAZEPINES (GC/LC/MS), URINE
Alprazolam metabolite (GC/LC/MS), ur confirm: 151 ng/mL (ref <25)
Clonazepam metabolite (GC/LC/MS), ur confirm: NEGATIVE ng/mL (ref <25)
Flurazepam metabolite (GC/LC/MS), ur confirm: NEGATIVE ng/mL (ref <50)
Lorazepam (GC/LC/MS), ur confirm: NEGATIVE ng/mL (ref <50)
Midazolam (GC/LC/MS), ur confirm: NEGATIVE ng/mL (ref <50)
Nordiazepam (GC/LC/MS), ur confirm: 92 ng/mL (ref <50)
Oxazepam (GC/LC/MS), ur confirm: 831 ng/mL (ref <50)
Temazepam (GC/LC/MS), ur confirm: 1317 ng/mL (ref <50)
Triazolam metabolite (GC/LC/MS), ur confirm: NEGATIVE ng/mL (ref <50)

## 2014-04-23 LAB — ETHYL GLUCURONIDE, URINE
Ethyl Glucuronide (EtG): 607 ng/mL — ABNORMAL HIGH (ref <500)
Ethyl Sulfate (ETS): 208 ng/mL — ABNORMAL HIGH (ref <100)

## 2014-04-23 LAB — OPIATES/OPIOIDS (LC/MS-MS)
Codeine Urine: NEGATIVE ng/mL (ref <50)
Hydrocodone: NEGATIVE ng/mL (ref <50)
Hydromorphone: NEGATIVE ng/mL — AB (ref <50)
Morphine Urine: 21202 ng/mL (ref <50)
Norhydrocodone, Ur: NEGATIVE ng/mL (ref <50)
Noroxycodone, Ur: 9006 ng/mL (ref <50)
Oxycodone, ur: 4079 ng/mL (ref <50)
Oxymorphone: 894 ng/mL (ref <50)

## 2014-04-23 LAB — CANNABANOIDS (GC/LC/MS), URINE: THC-COOH (GC/LC/MS), ur confirm: 27 ng/mL — AB (ref <5)

## 2014-04-23 LAB — OXYCODONE, URINE (LC/MS-MS)
Noroxycodone, Ur: 9006 ng/mL (ref <50)
Oxycodone, ur: 4079 ng/mL (ref <50)
Oxymorphone: 894 ng/mL (ref <50)

## 2014-04-24 LAB — PRESCRIPTION MONITORING PROFILE (SOLSTAS)
Amphetamine/Meth: NEGATIVE ng/mL
Barbiturate Screen, Urine: NEGATIVE ng/mL
Buprenorphine, Urine: NEGATIVE ng/mL
Carisoprodol, Urine: NEGATIVE ng/mL
Cocaine Metabolites: NEGATIVE ng/mL
Creatinine, Urine: 251.21 mg/dL (ref >20.0)
Fentanyl, Ur: NEGATIVE ng/mL
MDMA URINE: NEGATIVE ng/mL
Meperidine, Ur: NEGATIVE ng/mL
Methadone Screen, Urine: NEGATIVE ng/mL
Nitrites, Initial: NEGATIVE ug/mL
Propoxyphene: NEGATIVE ng/mL
Tapentadol, urine: NEGATIVE ng/mL
Tramadol Scrn, Ur: NEGATIVE ng/mL
Zolpidem, Urine: NEGATIVE ng/mL
pH, Initial: 6 pH (ref 4.5–8.9)

## 2014-05-02 ENCOUNTER — Encounter: Payer: Medicare HMO | Attending: Family Medicine | Admitting: Dietician

## 2014-05-02 DIAGNOSIS — Z6841 Body Mass Index (BMI) 40.0 and over, adult: Secondary | ICD-10-CM | POA: Insufficient documentation

## 2014-05-02 DIAGNOSIS — Z713 Dietary counseling and surveillance: Secondary | ICD-10-CM | POA: Diagnosis not present

## 2014-05-02 NOTE — Progress Notes (Signed)
  Medical Nutrition Therapy:  Appt start time: 4332 end time: 1030  Caroline Williams: Ms. Forge returns for supervised weight loss having maintained her weight since last visit. She plans to have surgery at the end of January or early February. She reports she has been very mindful of what and when she eats. Has already started taking vitamins and still having protein drinks. She will eat Skinny popcorn at night if she wants a snack. She also reports that she feels prepared for holiday temptations.     Wt Readings from Last 3 Encounters:  05/02/14 298 lb 3.2 oz (135.263 kg)  04/19/14 297 lb (134.718 kg)  03/22/14 297 lb (134.718 kg)   Ht Readings from Last 3 Encounters:  04/19/14 5\' 5"  (1.651 m)  03/22/14 5\' 5"  (1.651 m)  03/21/14 5\' 5"  (1.651 m)   There is no weight on file to calculate BMI. @BMIFA @ Normalized weight-for-age data available only for age 41 to 54 years. Normalized stature-for-age data available only for age 41 to 9 years.   Preferred Learning Style:   No preference indicated   Learning Readiness:   Ready   MEDICATIONS: see list (recommended that patient see PCP regarding medication management)   DIETARY INTAKE:  24-hr recall:  B ( AM): Premier protein shake and boiled egg Snk ( AM):  L ( PM): tuna on 6in sub (no chips) Snk ( PM):  D ( PM): sometimes fried chicken; grilled BBQ chicken  Snk ( PM): none  Beverages: water, unsweet tea  Usual physical activity: water aerobics 2x a week; physical therapy 1x a week  Estimated energy needs: 1600 calories 180 g carbohydrates 120 g protein 44 g fat  Progress Towards Goal(s):  In progress.   Nutritional Diagnosis:  [place]-3.3 Overweight/obesity As related to excessive energy intake and physical inactivity.  As evidenced by BMI 54.    Intervention:  Nutrition counseling provided.  Samples provided and patient educated on proper use:  Unjury protein powder (chocolate - qty 1) Lot#: [id number] Exp: 03/2015  Unjury  protein powder (strawberry - qty 1) Lot#: [id number] Exp: 01/2015  Unjury protein powder (vanilla - qty 1) Lot#: [id number] Exp: 03/2015  Unjury protein powder (chicken soup - qty 1) Lot#: [id number] Exp: 08/2014  PB2 (qty 2) Lot#: [id number] Exp: 01/2015  Teaching Method Utilized: Visual Auditory  Barriers to learning/adherence to lifestyle change: knee pain  Demonstrated degree of understanding via:  Teach Back   Monitoring/Evaluation:  Dietary intake, exercise, and body weight in 1 month(s).

## 2014-05-02 NOTE — Patient Instructions (Signed)
-  Try cooking with extra virgin olive oil -Try not to eat late at night -Start wearing partial dentures to help chew -Practice pre op goals (see handout) -Keep healthy snacks on hand to help avoid desserts (see snack list) -Focus on protein foods for snacks and meals -Fill up on non-starchy vegetables (any veggie except corn, peas, or potatoes) -Watch portions (practice reading food labels) -Try getting back into water aerobics -Find ways to manage stress without eating   -Have something with protein at every meal and snack -Keep having broth-based soups for lunch several days a week -Continue doing water aerobics! -Try Smart Balance butter -Try seasoning without salt -Try [employer] Kick products  **Goal: To be healthy!!!**  New goal: 290-295 lbs

## 2014-05-17 ENCOUNTER — Encounter: Payer: Medicare HMO | Attending: Physical Medicine & Rehabilitation | Admitting: Registered Nurse

## 2014-05-17 ENCOUNTER — Encounter: Payer: Self-pay | Admitting: Registered Nurse

## 2014-05-17 VITALS — BP 126/71 | HR 111 | Resp 14 | Ht 65.0 in | Wt 305.0 lb

## 2014-05-17 DIAGNOSIS — M1711 Unilateral primary osteoarthritis, right knee: Secondary | ICD-10-CM | POA: Insufficient documentation

## 2014-05-17 DIAGNOSIS — Z5181 Encounter for therapeutic drug level monitoring: Secondary | ICD-10-CM

## 2014-05-17 DIAGNOSIS — M25561 Pain in right knee: Secondary | ICD-10-CM

## 2014-05-17 DIAGNOSIS — G8929 Other chronic pain: Secondary | ICD-10-CM | POA: Diagnosis present

## 2014-05-17 DIAGNOSIS — G894 Chronic pain syndrome: Secondary | ICD-10-CM

## 2014-05-17 MED ORDER — MORPHINE SULFATE ER 15 MG PO TBCR: 15.0000 mg | EXTENDED_RELEASE_TABLET | Freq: Two times a day (BID) | ORAL | Status: DC

## 2014-05-17 MED ORDER — OXYCODONE HCL 10 MG PO TABS: 10.0000 mg | ORAL_TABLET | Freq: Four times a day (QID) | ORAL | Status: DC | PRN

## 2014-05-17 NOTE — Progress Notes (Addendum)
Subjective:    Patient ID: Caroline Williams, female    DOB: 1960-11-13, 53 y.o.   MRN: [id number]  HPI: [name] is a 53 year old female who returns for follow up for chronic pain and medication refill. She says her pain is located in her right knee. She rates her pain 8. Her current exercise regime is performing stretching exercises, walking and attending physical therapy twice a week. She admits to feeling depressed regarding what happened to her daughter at [place]. Emotional support given, all questions answered. Arrived to office tachycardic 111 apical pulse checked 100. She missed her atenolol dose this morning educated on compliance she verbalizes understanding.   Pain Inventory Average Pain 8 Pain Right Now 8 My pain is aching  In the last 24 hours, has pain interfered with the following? General activity 9 Relation with others 10 Enjoyment of life 9 What TIME of day is your pain at its worst? morning, evening, night Sleep (in general) Poor  Pain is worse with: walking and standing Pain improves with: heat/ice, medication and TENS Relief from Meds: 7  Mobility use a cane how many minutes can you walk? 10 use a wheelchair  Function disabled: date disabled .  Neuro/Psych trouble walking depression anxiety  Prior Studies Any changes since last visit?  no  Physicians involved in your care Any changes since last visit?  no   Family History  Problem Relation Age of Onset  . Cancer Father     colon  . Stroke Father   . Heart disease Father   . Depression Sister    History   Social History  . Marital Status: Single    Spouse Name: N/A    Number of Children: N/A  . Years of Education: N/A   Social History Main Topics  . Smoking status: Current Every Day Smoker -- 0.50 packs/day for 4 years    Types: Cigarettes  . Smokeless tobacco: Never Used  . Alcohol Use: No  . Drug Use: No  . Sexual Activity: Yes    Birth Control/ Protection: None   Other  Topics Concern  . None   Social History Narrative   Past Surgical History  Procedure Laterality Date  . Cesarean section    . Breast lumpectomy      right  . Hernia repair    . Appendectomy    . Abdominal hysterectomy      partial  . Knee surgery      right  . Myomectomy abdominal approach  1989  . Myomectomy abdominal approach  1989  . Cardiac catheterization      no significant CAD, nl LV function by 11/08/06 cath  . Total knee revision  06/21/2012    Procedure: TOTAL KNEE REVISION;  Surgeon: Newt Minion, MD;  Location: [place];  Service: Orthopedics;  Laterality: Right;  Revision Right Total Knee Arthroplasty   Past Medical History  Diagnosis Date  . History of knee replacement, total   . Fever   . Breast lump   . Breast discharge   . Breast pain   . Diabetes mellitus   . Thyroid disease   . Asthma   . Hiatal hernia   . Arthritis   . Depression   . Hypertension   . Bipolar 1 disorder   . Hypothyroidism   . Sleep apnea    BP 126/71 mmHg  Pulse 111  Resp 14  Ht 5\' 5"  (1.651 m)  Wt 305 lb (138.347 kg)  BMI 50.75 kg/m2  SpO2 100%  Opioid Risk Score:   Fall Risk Score: Moderate Fall Risk (6-13 points) (pt given pamphlet today) Review of Systems  Musculoskeletal: Positive for gait problem.  Psychiatric/Behavioral: Positive for dysphoric mood. The patient is nervous/anxious.   All other systems reviewed and are negative.      Objective:   Physical Exam  Constitutional: She is oriented to person, place, and time. She appears well-developed and well-nourished.  HENT:  Head: Normocephalic and atraumatic.  Neck: Normal range of motion. Neck supple.  Cardiovascular: Normal rate and regular rhythm.   Pulmonary/Chest: Effort normal and breath sounds normal.  Musculoskeletal:  Normal Muscle Bulk and Muscle Testing Reveals: Upper Extremities: Full ROM and Muscle Strength 5/5 Back without spinal or paraspinal tenderness noted Lower extremities: Full ROM and Muscle  Strength 5/5 Right Flexion Produces pain into Patella Right Knee Brace on Arises from chair with ease Narrow Based gait  Neurological: She is alert and oriented to person, place, and time.  Skin: Skin is warm and dry.  Psychiatric: She has a normal mood and affect.  Nursing note and vitals reviewed.         Assessment & Plan:  1. Right Knee Pain, History of OA and History of knee replacement with revision:  Refilled: MS CONTIN 15 mg one tablet every 12 hours #60 and Oxycodone 10 mg one tablet every 6 hours as needed for pain #120  2. Morbid Obesity: Continue to Work on [employer] and weight loss.  3. DM2 with neuropathy: Continue with Voltaren gel and gabapentin   20 minutes of face to face patient care time was spent during this visit. All questions were encouraged and answered.  F/U in 1 month

## 2014-05-31 ENCOUNTER — Encounter: Payer: Medicare HMO | Attending: Family Medicine | Admitting: Dietician

## 2014-05-31 DIAGNOSIS — Z713 Dietary counseling and surveillance: Secondary | ICD-10-CM | POA: Insufficient documentation

## 2014-05-31 DIAGNOSIS — Z6841 Body Mass Index (BMI) 40.0 and over, adult: Secondary | ICD-10-CM | POA: Insufficient documentation

## 2014-05-31 NOTE — Progress Notes (Signed)
  Medical Nutrition Therapy:  Appt start time: 2549 end time: 1055  SWL: Caroline Williams is here for her final SWL visit. She has gained 6 pounds since last month, reports she is having a lot of swelling. Has an appointment with Dr. Hassell Done on 12/30. I answered the patient's questions regarding pending surgery. She reports that she feels prepared for bariatric surgery    Wt Readings from Last 3 Encounters:  05/17/14 305 lb (138.347 kg)  05/02/14 298 lb 3.2 oz (135.263 kg)  04/19/14 297 lb (134.718 kg)   Ht Readings from Last 3 Encounters:  05/17/14 5\' 5"  (1.651 m)  04/19/14 5\' 5"  (1.651 m)  03/22/14 5\' 5"  (1.651 m)   There is no weight on file to calculate BMI. @BMIFA @ Normalized weight-for-age data available only for age 10 to 47 years. Normalized stature-for-age data available only for age 10 to 40 years.   Preferred Learning Style:   No preference indicated   Learning Readiness:   Ready   MEDICATIONS: see list (recommended that patient see PCP regarding medication management)   DIETARY INTAKE:  24-hr recall:  B ( AM): Premier protein shake and boiled egg Snk ( AM):  L ( PM): tuna on 6in sub (no chips) Snk ( PM):  D ( PM): sometimes fried chicken; grilled BBQ chicken  Snk ( PM): none  Beverages: water, unsweet tea  Usual physical activity: water aerobics 2x a week; physical therapy 1x a week  Estimated energy needs: 1600 calories 180 g carbohydrates 120 g protein 44 g fat  Progress Towards Goal(s):  In progress.   Nutritional Diagnosis:  [place]-3.3 Overweight/obesity As related to excessive energy intake and physical inactivity.  As evidenced by BMI 54.    Intervention:  Nutrition counseling provided.  Samples provided and patient educated on proper use:   Samples provided and patient educated on proper use:  Unjury protein powder (chocolate - qty 2) Lot#: [id number] Exp: 05/2015  Unjury protein powder (unflavored - qty 2) Lot#: [id number] Exp: 04/2015  PB2 (qty  2) Lot#: [id number] Exp: 01/2015   Teaching Method Utilized: Visual Auditory  Barriers to learning/adherence to lifestyle change: knee pain  Demonstrated degree of understanding via:  Teach Back   Monitoring/Evaluation:  Dietary intake, exercise, and body weight in 1 month(s).

## 2014-05-31 NOTE — Patient Instructions (Signed)
2 doses of a "complete" multivitamin (Flintstones complete) - not gummy 1500 mg Calcium citrate (500 mg 3x a day) Vitamin B12 - sublingual

## 2014-06-17 ENCOUNTER — Encounter: Payer: Commercial Managed Care - HMO | Attending: Physical Medicine & Rehabilitation | Admitting: Registered Nurse

## 2014-06-17 ENCOUNTER — Encounter: Payer: Self-pay | Admitting: Registered Nurse

## 2014-06-17 VITALS — BP 145/65 | HR 88 | Resp 14

## 2014-06-17 DIAGNOSIS — M545 Low back pain, unspecified: Secondary | ICD-10-CM

## 2014-06-17 DIAGNOSIS — Z5181 Encounter for therapeutic drug level monitoring: Secondary | ICD-10-CM | POA: Diagnosis not present

## 2014-06-17 DIAGNOSIS — G8929 Other chronic pain: Secondary | ICD-10-CM | POA: Diagnosis not present

## 2014-06-17 DIAGNOSIS — Z79899 Other long term (current) drug therapy: Secondary | ICD-10-CM | POA: Diagnosis not present

## 2014-06-17 DIAGNOSIS — M25561 Pain in right knee: Secondary | ICD-10-CM | POA: Diagnosis not present

## 2014-06-17 DIAGNOSIS — M1711 Unilateral primary osteoarthritis, right knee: Secondary | ICD-10-CM | POA: Insufficient documentation

## 2014-06-17 DIAGNOSIS — G894 Chronic pain syndrome: Secondary | ICD-10-CM | POA: Diagnosis not present

## 2014-06-17 MED ORDER — OXYCODONE HCL 10 MG PO TABS: 10.0000 mg | ORAL_TABLET | Freq: Four times a day (QID) | ORAL | Status: DC | PRN

## 2014-06-17 MED ORDER — MORPHINE SULFATE ER 15 MG PO TBCR: 15.0000 mg | EXTENDED_RELEASE_TABLET | Freq: Two times a day (BID) | ORAL | Status: DC

## 2014-06-17 NOTE — Progress Notes (Signed)
Subjective:    Patient ID: Caroline Williams, female    DOB: [birth year], 54 y.o.   MRN: [id number]  HPI: Caroline Williams is a 54 year old female who returns for follow up for chronic pain and medication refill. She says her pain is located in her lower back and bilateral knee's. She rates her pain 7. Her current exercise regime is performing stretching exercises, walking and attending physical therapy twice a week. She says she has a boil located on her left inner thigh, she will be following with her PCP today. She called her PCP on Saturday they weren't able to see her. She will follow up this morning or go to urgent care.  Pain Inventory Average Pain 8 Pain Right Now 7 My pain is aching and other  In the last 24 hours, has pain interfered with the following? General activity 9 Relation with others 10 Enjoyment of life 10 What TIME of day is your pain at its worst? morning, evening, night Sleep (in general) Poor  Pain is worse with: inactivity Pain improves with: heat/ice, therapy/exercise, medication and TENS Relief from Meds: 8  Mobility walk with assistance use a cane use a walker how many minutes can you walk? 8-10 ability to climb steps?  yes do you drive?  yes Do you have any goals in this area?  yes  Function I need assistance with the following:  dressing, bathing, meal prep, household duties and shopping  Neuro/Psych bladder control problems weakness numbness tingling depression anxiety  Prior Studies Any changes since last visit?  no  Physicians involved in your care Any changes since last visit?  no   Family History  Problem Relation Age of Onset  . Cancer Father     colon  . Stroke Father   . Heart disease Father   . Depression Sister    History   Social History  . Marital Status: Single    Spouse Name: N/A    Number of Children: N/A  . Years of Education: N/A   Social History Main Topics  . Smoking status: Current Every Day Smoker -- 0.50  packs/day for 4 years    Types: Cigarettes  . Smokeless tobacco: Never Used  . Alcohol Use: No  . Drug Use: No  . Sexual Activity: Yes    Birth Control/ Protection: None   Other Topics Concern  . None   Social History Narrative   Past Surgical History  Procedure Laterality Date  . Cesarean section    . Breast lumpectomy      right  . Hernia repair    . Appendectomy    . Abdominal hysterectomy      partial  . Knee surgery      right  . Myomectomy abdominal approach  1989  . Myomectomy abdominal approach  1989  . Cardiac catheterization      no significant CAD, nl LV function by 11/08/06 cath  . Total knee revision  06/21/2012    Procedure: TOTAL KNEE REVISION;  Surgeon: Newt Minion, MD;  Location: [place];  Service: Orthopedics;  Laterality: Right;  Revision Right Total Knee Arthroplasty   Past Medical History  Diagnosis Date  . History of knee replacement, total   . Fever   . Breast lump   . Breast discharge   . Breast pain   . Diabetes mellitus   . Thyroid disease   . Asthma   . Hiatal hernia   . Arthritis   . Depression   .  Hypertension   . Bipolar 1 disorder   . Hypothyroidism   . Sleep apnea    BP 145/65 mmHg  Pulse 88  Resp 14  SpO2 96%  Opioid Risk Score:   Fall Risk Score: Moderate Fall Risk (6-13 points) Review of Systems  Genitourinary:       Incontinence  Neurological: Positive for weakness and numbness.       Tingling  Psychiatric/Behavioral: Positive for dysphoric mood. The patient is nervous/anxious.   All other systems reviewed and are negative.      Objective:   Physical Exam  Constitutional: She is oriented to person, place, and time. She appears well-developed and well-nourished.  HENT:  Head: Normocephalic and atraumatic.  Neck: Normal range of motion. Neck supple.  Cardiovascular: Normal rate and regular rhythm.   Pulmonary/Chest: Effort normal and breath sounds normal.  Musculoskeletal:  Normal Muscle Bulk and Muscle Testing  Reveals: Upper extremities: Full ROM and Muscle strength 5/5 Back without spinal or paraspinal tenderness Lower Extremities: Full ROM and Muscle strength 5/5 Right Lower extremity Flexion Produces Pain into Patella Arises from chair with slight difficulty Antalgic /Wide based gait Using straight cane for support Right Brace Intact  Neurological: She is alert and oriented to person, place, and time.  Skin: Skin is warm and dry.  Psychiatric: She has a normal mood and affect.  Nursing note and vitals reviewed.         Assessment & Plan:  1. Right Knee Pain, History of OA and History of knee replacement with revision: Has a F/U appointment with Dr. Sharol Given 06/20/14. Refilled: MS CONTIN 15 mg one tablet every 12 hours #60 and Oxycodone 10 mg one tablet every 6 hours as needed for pain #120  2. Morbid Obesity: Continue to Work on [employer] and weight loss.  3. DM2 with neuropathy: Continue with Voltaren gel and gabapentin   20 minutes of face to face patient care time was spent during this visit. All questions were encouraged and answered.

## 2014-06-18 DIAGNOSIS — Z23 Encounter for immunization: Secondary | ICD-10-CM | POA: Diagnosis not present

## 2014-06-18 DIAGNOSIS — I10 Essential (primary) hypertension: Secondary | ICD-10-CM | POA: Diagnosis not present

## 2014-06-18 DIAGNOSIS — E039 Hypothyroidism, unspecified: Secondary | ICD-10-CM | POA: Diagnosis not present

## 2014-06-18 DIAGNOSIS — L02439 Carbuncle of limb, unspecified: Secondary | ICD-10-CM | POA: Diagnosis not present

## 2014-06-18 DIAGNOSIS — Z79899 Other long term (current) drug therapy: Secondary | ICD-10-CM | POA: Diagnosis not present

## 2014-06-18 DIAGNOSIS — E782 Mixed hyperlipidemia: Secondary | ICD-10-CM | POA: Diagnosis not present

## 2014-06-18 DIAGNOSIS — E1140 Type 2 diabetes mellitus with diabetic neuropathy, unspecified: Secondary | ICD-10-CM | POA: Diagnosis not present

## 2014-06-20 DIAGNOSIS — M25561 Pain in right knee: Secondary | ICD-10-CM | POA: Diagnosis not present

## 2014-06-20 DIAGNOSIS — M170 Bilateral primary osteoarthritis of knee: Secondary | ICD-10-CM | POA: Diagnosis not present

## 2014-06-20 DIAGNOSIS — M25562 Pain in left knee: Secondary | ICD-10-CM | POA: Diagnosis not present

## 2014-06-20 DIAGNOSIS — M545 Low back pain: Secondary | ICD-10-CM | POA: Diagnosis not present

## 2014-06-24 DIAGNOSIS — W108XXD Fall (on) (from) other stairs and steps, subsequent encounter: Secondary | ICD-10-CM | POA: Diagnosis not present

## 2014-06-24 DIAGNOSIS — E671 Hypercarotinemia: Secondary | ICD-10-CM | POA: Diagnosis not present

## 2014-06-24 DIAGNOSIS — R262 Difficulty in walking, not elsewhere classified: Secondary | ICD-10-CM | POA: Diagnosis not present

## 2014-06-24 DIAGNOSIS — M25461 Effusion, right knee: Secondary | ICD-10-CM | POA: Diagnosis not present

## 2014-06-24 DIAGNOSIS — M25661 Stiffness of right knee, not elsewhere classified: Secondary | ICD-10-CM | POA: Diagnosis not present

## 2014-06-24 DIAGNOSIS — I10 Essential (primary) hypertension: Secondary | ICD-10-CM | POA: Diagnosis not present

## 2014-06-24 DIAGNOSIS — M25561 Pain in right knee: Secondary | ICD-10-CM | POA: Diagnosis not present

## 2014-06-25 DIAGNOSIS — E039 Hypothyroidism, unspecified: Secondary | ICD-10-CM | POA: Diagnosis not present

## 2014-06-25 DIAGNOSIS — E782 Mixed hyperlipidemia: Secondary | ICD-10-CM | POA: Diagnosis not present

## 2014-06-25 DIAGNOSIS — E1140 Type 2 diabetes mellitus with diabetic neuropathy, unspecified: Secondary | ICD-10-CM | POA: Diagnosis not present

## 2014-06-25 DIAGNOSIS — Z79899 Other long term (current) drug therapy: Secondary | ICD-10-CM | POA: Diagnosis not present

## 2014-06-25 DIAGNOSIS — I10 Essential (primary) hypertension: Secondary | ICD-10-CM | POA: Diagnosis not present

## 2014-06-28 DIAGNOSIS — M25461 Effusion, right knee: Secondary | ICD-10-CM | POA: Diagnosis not present

## 2014-06-28 DIAGNOSIS — M25661 Stiffness of right knee, not elsewhere classified: Secondary | ICD-10-CM | POA: Diagnosis not present

## 2014-06-28 DIAGNOSIS — M25561 Pain in right knee: Secondary | ICD-10-CM | POA: Diagnosis not present

## 2014-06-28 DIAGNOSIS — E671 Hypercarotinemia: Secondary | ICD-10-CM | POA: Diagnosis not present

## 2014-06-28 DIAGNOSIS — I10 Essential (primary) hypertension: Secondary | ICD-10-CM | POA: Diagnosis not present

## 2014-06-28 DIAGNOSIS — R262 Difficulty in walking, not elsewhere classified: Secondary | ICD-10-CM | POA: Diagnosis not present

## 2014-06-28 DIAGNOSIS — W108XXD Fall (on) (from) other stairs and steps, subsequent encounter: Secondary | ICD-10-CM | POA: Diagnosis not present

## 2014-07-03 DIAGNOSIS — R262 Difficulty in walking, not elsewhere classified: Secondary | ICD-10-CM | POA: Diagnosis not present

## 2014-07-03 DIAGNOSIS — M25561 Pain in right knee: Secondary | ICD-10-CM | POA: Diagnosis not present

## 2014-07-03 DIAGNOSIS — W108XXD Fall (on) (from) other stairs and steps, subsequent encounter: Secondary | ICD-10-CM | POA: Diagnosis not present

## 2014-07-03 DIAGNOSIS — M25461 Effusion, right knee: Secondary | ICD-10-CM | POA: Diagnosis not present

## 2014-07-03 DIAGNOSIS — I10 Essential (primary) hypertension: Secondary | ICD-10-CM | POA: Diagnosis not present

## 2014-07-03 DIAGNOSIS — E671 Hypercarotinemia: Secondary | ICD-10-CM | POA: Diagnosis not present

## 2014-07-03 DIAGNOSIS — M25661 Stiffness of right knee, not elsewhere classified: Secondary | ICD-10-CM | POA: Diagnosis not present

## 2014-07-04 ENCOUNTER — Other Ambulatory Visit (INDEPENDENT_AMBULATORY_CARE_PROVIDER_SITE_OTHER): Payer: Self-pay

## 2014-07-04 DIAGNOSIS — Z01818 Encounter for other preprocedural examination: Secondary | ICD-10-CM

## 2014-07-09 ENCOUNTER — Other Ambulatory Visit (INDEPENDENT_AMBULATORY_CARE_PROVIDER_SITE_OTHER): Payer: Self-pay

## 2014-07-09 NOTE — Addendum Note (Signed)
Addended by: Johnathan Hausen B on: 07/09/2014 07:42 PM   Modules accepted: Orders

## 2014-07-10 DIAGNOSIS — J22 Unspecified acute lower respiratory infection: Secondary | ICD-10-CM | POA: Diagnosis not present

## 2014-07-10 DIAGNOSIS — R350 Frequency of micturition: Secondary | ICD-10-CM | POA: Diagnosis not present

## 2014-07-10 DIAGNOSIS — J45901 Unspecified asthma with (acute) exacerbation: Secondary | ICD-10-CM | POA: Diagnosis not present

## 2014-07-10 DIAGNOSIS — R0689 Other abnormalities of breathing: Secondary | ICD-10-CM | POA: Diagnosis not present

## 2014-07-16 ENCOUNTER — Encounter: Payer: Self-pay | Admitting: Registered Nurse

## 2014-07-16 ENCOUNTER — Encounter: Payer: Commercial Managed Care - HMO | Attending: Physical Medicine & Rehabilitation | Admitting: Registered Nurse

## 2014-07-16 VITALS — BP 155/71 | HR 102 | Resp 14

## 2014-07-16 DIAGNOSIS — Z5181 Encounter for therapeutic drug level monitoring: Secondary | ICD-10-CM

## 2014-07-16 DIAGNOSIS — M1711 Unilateral primary osteoarthritis, right knee: Secondary | ICD-10-CM | POA: Insufficient documentation

## 2014-07-16 DIAGNOSIS — G8929 Other chronic pain: Secondary | ICD-10-CM | POA: Diagnosis not present

## 2014-07-16 DIAGNOSIS — G894 Chronic pain syndrome: Secondary | ICD-10-CM

## 2014-07-16 DIAGNOSIS — Z79899 Other long term (current) drug therapy: Secondary | ICD-10-CM | POA: Diagnosis not present

## 2014-07-16 DIAGNOSIS — M25561 Pain in right knee: Secondary | ICD-10-CM | POA: Diagnosis not present

## 2014-07-16 MED ORDER — MORPHINE SULFATE ER 15 MG PO TBCR: 15.0000 mg | EXTENDED_RELEASE_TABLET | Freq: Two times a day (BID) | ORAL | Status: DC

## 2014-07-16 MED ORDER — OXYCODONE HCL 10 MG PO TABS: 10.0000 mg | ORAL_TABLET | Freq: Four times a day (QID) | ORAL | Status: DC | PRN
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
## 2014-07-19 ENCOUNTER — Telehealth: Payer: Self-pay | Admitting: Physical Medicine & Rehabilitation

## 2014-07-19 ENCOUNTER — Ambulatory Visit
Admission: RE | Admit: 2014-07-19 | Discharge: 2014-07-19 | Disposition: A | Discharge status: Home / Self Care | Payer: Commercial Managed Care - HMO | Source: Ambulatory Visit | Attending: Family | Admitting: Family

## 2014-07-19 ENCOUNTER — Other Ambulatory Visit: Payer: Self-pay | Admitting: Family

## 2014-07-19 DIAGNOSIS — R0602 Shortness of breath: Secondary | ICD-10-CM | POA: Diagnosis not present

## 2014-07-19 DIAGNOSIS — R05 Cough: Secondary | ICD-10-CM | POA: Diagnosis not present

## 2014-07-19 DIAGNOSIS — J45909 Unspecified asthma, uncomplicated: Secondary | ICD-10-CM

## 2014-07-19 NOTE — Telephone Encounter (Signed)
Called Caroline Williams. She took RX to pharmacy and they told her they could process it. She does not need a replacement at this time.

## 2014-07-19 NOTE — Telephone Encounter (Signed)
Patient was just given a script and accidentally washed it.  Needs a new prescription.  Please call

## 2014-07-23 DIAGNOSIS — M25561 Pain in right knee: Secondary | ICD-10-CM | POA: Diagnosis not present

## 2014-07-23 DIAGNOSIS — R262 Difficulty in walking, not elsewhere classified: Secondary | ICD-10-CM | POA: Diagnosis not present

## 2014-07-23 DIAGNOSIS — M25461 Effusion, right knee: Secondary | ICD-10-CM | POA: Diagnosis not present

## 2014-07-23 DIAGNOSIS — M25661 Stiffness of right knee, not elsewhere classified: Secondary | ICD-10-CM | POA: Diagnosis not present

## 2014-07-26 ENCOUNTER — Telehealth: Payer: Self-pay | Admitting: *Deleted

## 2014-07-26 ENCOUNTER — Telehealth: Payer: Self-pay | Admitting: Registered Nurse

## 2014-07-26 DIAGNOSIS — M25561 Pain in right knee: Secondary | ICD-10-CM | POA: Diagnosis not present

## 2014-07-26 DIAGNOSIS — M25661 Stiffness of right knee, not elsewhere classified: Secondary | ICD-10-CM | POA: Diagnosis not present

## 2014-07-26 DIAGNOSIS — R262 Difficulty in walking, not elsewhere classified: Secondary | ICD-10-CM | POA: Diagnosis not present

## 2014-07-26 DIAGNOSIS — M25461 Effusion, right knee: Secondary | ICD-10-CM | POA: Diagnosis not present

## 2014-07-26 NOTE — Telephone Encounter (Signed)
Caroline Williams, Caroline Williams the office complaining of excruciating pain.  I spoke to Caroline Williams she states" she was walking in her home yesterday and her left leg gave out and she fell onto the floor.  She called me while at  physical therapy, I spoke to her therapist Caroline Williams. Caroline Williams states" her knee has inflammation and she feels it's soft tissue related. Also stated she has frequent fluctuations with inflammation.  Asked Caroline Williams to call Dr. Sharol Given her orthopedist and to return my call after speaking to Dr. Sharol Given, she verbalizes understanding. Will order X-Rays. She verbalizes understanding.

## 2014-07-26 NOTE — Telephone Encounter (Signed)
I called Caroline Williams after she left the message about her knees.  She says that [place] called her and told her to talk with Dr Sharol Given.  He has told her to stop the relafen and take the mobic.  She is going to try that.  If she worsens over the weekend she knows she is to go to the ED.

## 2014-07-30 ENCOUNTER — Other Ambulatory Visit (INDEPENDENT_AMBULATORY_CARE_PROVIDER_SITE_OTHER): Payer: Self-pay

## 2014-07-30 DIAGNOSIS — Z01818 Encounter for other preprocedural examination: Secondary | ICD-10-CM

## 2014-07-30 DIAGNOSIS — M25461 Effusion, right knee: Secondary | ICD-10-CM | POA: Diagnosis not present

## 2014-07-30 DIAGNOSIS — Z1231 Encounter for screening mammogram for malignant neoplasm of breast: Secondary | ICD-10-CM

## 2014-07-30 DIAGNOSIS — M25561 Pain in right knee: Secondary | ICD-10-CM | POA: Diagnosis not present

## 2014-07-30 DIAGNOSIS — F250 Schizoaffective disorder, bipolar type: Secondary | ICD-10-CM | POA: Diagnosis not present

## 2014-07-30 DIAGNOSIS — M25661 Stiffness of right knee, not elsewhere classified: Secondary | ICD-10-CM | POA: Diagnosis not present

## 2014-07-30 DIAGNOSIS — R262 Difficulty in walking, not elsewhere classified: Secondary | ICD-10-CM | POA: Diagnosis not present

## 2014-08-02 DIAGNOSIS — M25561 Pain in right knee: Secondary | ICD-10-CM | POA: Diagnosis not present

## 2014-08-02 DIAGNOSIS — R262 Difficulty in walking, not elsewhere classified: Secondary | ICD-10-CM | POA: Diagnosis not present

## 2014-08-02 DIAGNOSIS — M25661 Stiffness of right knee, not elsewhere classified: Secondary | ICD-10-CM | POA: Diagnosis not present

## 2014-08-02 DIAGNOSIS — M25461 Effusion, right knee: Secondary | ICD-10-CM | POA: Diagnosis not present

## 2014-08-08 DIAGNOSIS — R262 Difficulty in walking, not elsewhere classified: Secondary | ICD-10-CM | POA: Diagnosis not present

## 2014-08-08 DIAGNOSIS — M25461 Effusion, right knee: Secondary | ICD-10-CM | POA: Diagnosis not present

## 2014-08-08 DIAGNOSIS — M25661 Stiffness of right knee, not elsewhere classified: Secondary | ICD-10-CM | POA: Diagnosis not present

## 2014-08-08 DIAGNOSIS — M25561 Pain in right knee: Secondary | ICD-10-CM | POA: Diagnosis not present

## 2014-08-09 DIAGNOSIS — M25561 Pain in right knee: Secondary | ICD-10-CM | POA: Diagnosis not present

## 2014-08-09 DIAGNOSIS — M25661 Stiffness of right knee, not elsewhere classified: Secondary | ICD-10-CM | POA: Diagnosis not present

## 2014-08-09 DIAGNOSIS — R262 Difficulty in walking, not elsewhere classified: Secondary | ICD-10-CM | POA: Diagnosis not present

## 2014-08-09 DIAGNOSIS — M25461 Effusion, right knee: Secondary | ICD-10-CM | POA: Diagnosis not present

## 2014-08-12 DIAGNOSIS — M1712 Unilateral primary osteoarthritis, left knee: Secondary | ICD-10-CM | POA: Diagnosis not present

## 2014-08-12 DIAGNOSIS — M545 Low back pain: Secondary | ICD-10-CM | POA: Diagnosis not present

## 2014-08-13 DIAGNOSIS — R262 Difficulty in walking, not elsewhere classified: Secondary | ICD-10-CM | POA: Diagnosis not present

## 2014-08-13 DIAGNOSIS — M25461 Effusion, right knee: Secondary | ICD-10-CM | POA: Diagnosis not present

## 2014-08-13 DIAGNOSIS — M25561 Pain in right knee: Secondary | ICD-10-CM | POA: Diagnosis not present

## 2014-08-13 DIAGNOSIS — M25661 Stiffness of right knee, not elsewhere classified: Secondary | ICD-10-CM | POA: Diagnosis not present

## 2014-08-14 ENCOUNTER — Encounter: Payer: Self-pay | Admitting: Registered Nurse

## 2014-08-14 ENCOUNTER — Encounter: Payer: Commercial Managed Care - HMO | Attending: Physical Medicine & Rehabilitation | Admitting: Registered Nurse

## 2014-08-14 VITALS — BP 177/95 | HR 86 | Resp 14

## 2014-08-14 DIAGNOSIS — Z79899 Other long term (current) drug therapy: Secondary | ICD-10-CM | POA: Diagnosis not present

## 2014-08-14 DIAGNOSIS — M1711 Unilateral primary osteoarthritis, right knee: Secondary | ICD-10-CM | POA: Diagnosis not present

## 2014-08-14 DIAGNOSIS — M25561 Pain in right knee: Secondary | ICD-10-CM | POA: Insufficient documentation

## 2014-08-14 DIAGNOSIS — Z5181 Encounter for therapeutic drug level monitoring: Secondary | ICD-10-CM

## 2014-08-14 DIAGNOSIS — G894 Chronic pain syndrome: Secondary | ICD-10-CM

## 2014-08-14 DIAGNOSIS — I10 Essential (primary) hypertension: Secondary | ICD-10-CM | POA: Diagnosis not present

## 2014-08-14 DIAGNOSIS — G8929 Other chronic pain: Secondary | ICD-10-CM | POA: Diagnosis not present

## 2014-08-14 MED ORDER — OXYCODONE HCL 10 MG PO TABS: 10.0000 mg | ORAL_TABLET | Freq: Four times a day (QID) | ORAL | Status: DC | PRN

## 2014-08-14 MED ORDER — MORPHINE SULFATE ER 15 MG PO TBCR: 15.0000 mg | EXTENDED_RELEASE_TABLET | Freq: Two times a day (BID) | ORAL | Status: DC
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
## 2014-08-15 DIAGNOSIS — M25461 Effusion, right knee: Secondary | ICD-10-CM | POA: Diagnosis not present

## 2014-08-15 DIAGNOSIS — I10 Essential (primary) hypertension: Secondary | ICD-10-CM | POA: Diagnosis not present

## 2014-08-15 DIAGNOSIS — R262 Difficulty in walking, not elsewhere classified: Secondary | ICD-10-CM | POA: Diagnosis not present

## 2014-08-15 DIAGNOSIS — M25561 Pain in right knee: Secondary | ICD-10-CM | POA: Diagnosis not present

## 2014-08-15 DIAGNOSIS — M25661 Stiffness of right knee, not elsewhere classified: Secondary | ICD-10-CM | POA: Diagnosis not present

## 2014-08-16 DIAGNOSIS — I10 Essential (primary) hypertension: Secondary | ICD-10-CM | POA: Diagnosis not present

## 2014-08-17 DIAGNOSIS — I10 Essential (primary) hypertension: Secondary | ICD-10-CM | POA: Diagnosis not present

## 2014-08-18 DIAGNOSIS — I10 Essential (primary) hypertension: Secondary | ICD-10-CM | POA: Diagnosis not present

## 2014-08-20 ENCOUNTER — Ambulatory Visit (HOSPITAL_COMMUNITY)
Admission: RE | Admit: 2014-08-20 | Discharge: 2014-08-20 | Disposition: A | Discharge status: Home / Self Care | Payer: Commercial Managed Care - HMO | Source: Ambulatory Visit | Attending: Surgery | Admitting: Surgery

## 2014-08-20 ENCOUNTER — Other Ambulatory Visit: Payer: Self-pay

## 2014-08-20 DIAGNOSIS — F17210 Nicotine dependence, cigarettes, uncomplicated: Secondary | ICD-10-CM | POA: Insufficient documentation

## 2014-08-20 DIAGNOSIS — G894 Chronic pain syndrome: Secondary | ICD-10-CM | POA: Insufficient documentation

## 2014-08-20 DIAGNOSIS — Z6841 Body Mass Index (BMI) 40.0 and over, adult: Secondary | ICD-10-CM | POA: Insufficient documentation

## 2014-08-20 DIAGNOSIS — I10 Essential (primary) hypertension: Secondary | ICD-10-CM | POA: Diagnosis not present

## 2014-08-20 DIAGNOSIS — E119 Type 2 diabetes mellitus without complications: Secondary | ICD-10-CM | POA: Insufficient documentation

## 2014-08-20 DIAGNOSIS — G4730 Sleep apnea, unspecified: Secondary | ICD-10-CM | POA: Diagnosis not present

## 2014-08-20 DIAGNOSIS — K219 Gastro-esophageal reflux disease without esophagitis: Secondary | ICD-10-CM | POA: Diagnosis not present

## 2014-08-20 DIAGNOSIS — M545 Low back pain: Secondary | ICD-10-CM | POA: Diagnosis not present

## 2014-08-20 DIAGNOSIS — M1711 Unilateral primary osteoarthritis, right knee: Secondary | ICD-10-CM | POA: Diagnosis not present

## 2014-08-20 DIAGNOSIS — Z13820 Encounter for screening for osteoporosis: Secondary | ICD-10-CM | POA: Insufficient documentation

## 2014-08-20 DIAGNOSIS — Z1231 Encounter for screening mammogram for malignant neoplasm of breast: Secondary | ICD-10-CM | POA: Diagnosis not present

## 2014-08-22 DIAGNOSIS — N3281 Overactive bladder: Secondary | ICD-10-CM | POA: Diagnosis not present

## 2014-08-22 DIAGNOSIS — R312 Other microscopic hematuria: Secondary | ICD-10-CM | POA: Diagnosis not present

## 2014-08-26 DIAGNOSIS — I10 Essential (primary) hypertension: Secondary | ICD-10-CM | POA: Diagnosis not present

## 2014-08-27 DIAGNOSIS — I10 Essential (primary) hypertension: Secondary | ICD-10-CM | POA: Diagnosis not present

## 2014-08-28 DIAGNOSIS — I10 Essential (primary) hypertension: Secondary | ICD-10-CM | POA: Diagnosis not present

## 2014-08-29 DIAGNOSIS — R262 Difficulty in walking, not elsewhere classified: Secondary | ICD-10-CM | POA: Diagnosis not present

## 2014-08-29 DIAGNOSIS — M25461 Effusion, right knee: Secondary | ICD-10-CM | POA: Diagnosis not present

## 2014-08-29 DIAGNOSIS — M25561 Pain in right knee: Secondary | ICD-10-CM | POA: Diagnosis not present

## 2014-08-29 DIAGNOSIS — M25661 Stiffness of right knee, not elsewhere classified: Secondary | ICD-10-CM | POA: Diagnosis not present

## 2014-08-29 DIAGNOSIS — I10 Essential (primary) hypertension: Secondary | ICD-10-CM | POA: Diagnosis not present

## 2014-08-30 DIAGNOSIS — M25461 Effusion, right knee: Secondary | ICD-10-CM | POA: Diagnosis not present

## 2014-08-30 DIAGNOSIS — M25561 Pain in right knee: Secondary | ICD-10-CM | POA: Diagnosis not present

## 2014-08-30 DIAGNOSIS — I10 Essential (primary) hypertension: Secondary | ICD-10-CM | POA: Diagnosis not present

## 2014-08-30 DIAGNOSIS — R262 Difficulty in walking, not elsewhere classified: Secondary | ICD-10-CM | POA: Diagnosis not present

## 2014-08-30 DIAGNOSIS — M25661 Stiffness of right knee, not elsewhere classified: Secondary | ICD-10-CM | POA: Diagnosis not present

## 2014-08-31 DIAGNOSIS — I10 Essential (primary) hypertension: Secondary | ICD-10-CM | POA: Diagnosis not present

## 2014-09-01 DIAGNOSIS — I10 Essential (primary) hypertension: Secondary | ICD-10-CM | POA: Diagnosis not present

## 2014-09-02 DIAGNOSIS — I10 Essential (primary) hypertension: Secondary | ICD-10-CM | POA: Diagnosis not present

## 2014-09-03 DIAGNOSIS — F250 Schizoaffective disorder, bipolar type: Secondary | ICD-10-CM | POA: Diagnosis not present

## 2014-09-03 DIAGNOSIS — M25461 Effusion, right knee: Secondary | ICD-10-CM | POA: Diagnosis not present

## 2014-09-03 DIAGNOSIS — R262 Difficulty in walking, not elsewhere classified: Secondary | ICD-10-CM | POA: Diagnosis not present

## 2014-09-03 DIAGNOSIS — M25561 Pain in right knee: Secondary | ICD-10-CM | POA: Diagnosis not present

## 2014-09-03 DIAGNOSIS — I10 Essential (primary) hypertension: Secondary | ICD-10-CM | POA: Diagnosis not present

## 2014-09-03 DIAGNOSIS — M25661 Stiffness of right knee, not elsewhere classified: Secondary | ICD-10-CM | POA: Diagnosis not present

## 2014-09-04 DIAGNOSIS — I10 Essential (primary) hypertension: Secondary | ICD-10-CM | POA: Diagnosis not present

## 2014-09-05 DIAGNOSIS — I10 Essential (primary) hypertension: Secondary | ICD-10-CM | POA: Diagnosis not present

## 2014-09-06 DIAGNOSIS — I10 Essential (primary) hypertension: Secondary | ICD-10-CM | POA: Diagnosis not present

## 2014-09-07 DIAGNOSIS — I10 Essential (primary) hypertension: Secondary | ICD-10-CM | POA: Diagnosis not present

## 2014-09-07 DIAGNOSIS — M25569 Pain in unspecified knee: Secondary | ICD-10-CM | POA: Diagnosis not present

## 2014-09-08 DIAGNOSIS — I10 Essential (primary) hypertension: Secondary | ICD-10-CM | POA: Diagnosis not present

## 2014-09-09 DIAGNOSIS — I10 Essential (primary) hypertension: Secondary | ICD-10-CM | POA: Diagnosis not present

## 2014-09-10 DIAGNOSIS — M25661 Stiffness of right knee, not elsewhere classified: Secondary | ICD-10-CM | POA: Diagnosis not present

## 2014-09-10 DIAGNOSIS — M25561 Pain in right knee: Secondary | ICD-10-CM | POA: Diagnosis not present

## 2014-09-10 DIAGNOSIS — R262 Difficulty in walking, not elsewhere classified: Secondary | ICD-10-CM | POA: Diagnosis not present

## 2014-09-10 DIAGNOSIS — I10 Essential (primary) hypertension: Secondary | ICD-10-CM | POA: Diagnosis not present

## 2014-09-10 DIAGNOSIS — M25461 Effusion, right knee: Secondary | ICD-10-CM | POA: Diagnosis not present

## 2014-09-11 DIAGNOSIS — I10 Essential (primary) hypertension: Secondary | ICD-10-CM | POA: Diagnosis not present

## 2014-09-12 DIAGNOSIS — I10 Essential (primary) hypertension: Secondary | ICD-10-CM | POA: Diagnosis not present

## 2014-09-12 DIAGNOSIS — M25561 Pain in right knee: Secondary | ICD-10-CM | POA: Diagnosis not present

## 2014-09-12 DIAGNOSIS — M25461 Effusion, right knee: Secondary | ICD-10-CM | POA: Diagnosis not present

## 2014-09-12 DIAGNOSIS — R262 Difficulty in walking, not elsewhere classified: Secondary | ICD-10-CM | POA: Diagnosis not present

## 2014-09-12 DIAGNOSIS — M25661 Stiffness of right knee, not elsewhere classified: Secondary | ICD-10-CM | POA: Diagnosis not present

## 2014-09-13 ENCOUNTER — Other Ambulatory Visit: Payer: Self-pay | Admitting: Registered Nurse

## 2014-09-13 ENCOUNTER — Encounter: Payer: Medicare Other | Attending: Physical Medicine & Rehabilitation | Admitting: Registered Nurse

## 2014-09-13 ENCOUNTER — Encounter: Payer: Self-pay | Admitting: Registered Nurse

## 2014-09-13 VITALS — BP 152/88 | HR 99 | Resp 14

## 2014-09-13 DIAGNOSIS — Z79899 Other long term (current) drug therapy: Secondary | ICD-10-CM | POA: Diagnosis not present

## 2014-09-13 DIAGNOSIS — G8929 Other chronic pain: Secondary | ICD-10-CM

## 2014-09-13 DIAGNOSIS — M25561 Pain in right knee: Secondary | ICD-10-CM | POA: Insufficient documentation

## 2014-09-13 DIAGNOSIS — Z5181 Encounter for therapeutic drug level monitoring: Secondary | ICD-10-CM

## 2014-09-13 DIAGNOSIS — M1711 Unilateral primary osteoarthritis, right knee: Secondary | ICD-10-CM | POA: Diagnosis not present

## 2014-09-13 DIAGNOSIS — G894 Chronic pain syndrome: Secondary | ICD-10-CM

## 2014-09-13 MED ORDER — MORPHINE SULFATE ER 15 MG PO TBCR: 15.0000 mg | EXTENDED_RELEASE_TABLET | Freq: Two times a day (BID) | ORAL | Status: DC

## 2014-09-13 MED ORDER — OXYCODONE HCL 10 MG PO TABS
10.0000 mg | ORAL_TABLET | Freq: Four times a day (QID) | ORAL | Status: DC | PRN
Start: 2014-09-13 — End: 2014-10-14

## 2014-09-13 NOTE — Progress Notes (Signed)
Subjective:    Patient ID: Caroline Williams, female    DOB: Jul 14, 1960, 54 y.o.   MRN: [id number]  HPI: Caroline Williams is a 54 year old female who returns for follow up for chronic pain and medication refill. She says her pain is located in her bilateral knee's. She rates her pain 5. She hasn't been following her current exercise regime due to increase intensity of pain in her bilateral knees. Facial grimacing noted with flexion of bilateral knees.  She has noticed problems with  concentration she stopped taking her Ritalin. She was encouraged to get an appointment with her psychiatrist she verbalizes understanding.  She fell on 09/06/14 was walking in her home and lost her footing she landed on her buttocks. She was able to get herself up. [facility] and received a Toradol shot she sates. She's vert tearful today her best friend was found deceased in her home yesterday. Emotional support was given.  Pain Inventory Average Pain 8 Pain Right Now 5 My pain is aching  In the last 24 hours, has pain interfered with the following? General activity 10 Relation with others 10 Enjoyment of life 9 What TIME of day is your pain at its worst? all Sleep (in general) Fair  Pain is worse with: walking, sitting, inactivity and standing Pain improves with: rest, heat/ice, medication and TENS Relief from Meds: 7  Mobility walk with assistance use a cane use a walker how many minutes can you walk? 5-7 ability to climb steps?  no do you drive?  yes Do you have any goals in this area?  yes  Function disabled: date disabled .  Neuro/Psych bladder control problems numbness trouble walking  Prior Studies Any changes since last visit?  no  Physicians involved in your care Any changes since last visit?  no   Family History  Problem Relation Age of Onset  . Cancer Father     colon  . Stroke Father   . Heart disease Father   . Depression Sister    History    Social History  . Marital Status: Single    Spouse Name: N/A  . Number of Children: N/A  . Years of Education: N/A   Social History Main Topics  . Smoking status: Current Every Day Smoker -- 0.50 packs/day for 4 years    Types: Cigarettes  . Smokeless tobacco: Never Used  . Alcohol Use: No  . Drug Use: No  . Sexual Activity: Yes    Birth Control/ Protection: None   Other Topics Concern  . None   Social History Narrative   Past Surgical History  Procedure Laterality Date  . Cesarean section    . Breast lumpectomy      right  . Hernia repair    . Appendectomy    . Abdominal hysterectomy      partial  . Knee surgery      right  . Myomectomy abdominal approach  1989  . Myomectomy abdominal approach  1989  . Cardiac catheterization      no significant CAD, nl LV function by 11/08/06 cath  . Total knee revision  06/21/2012    Procedure: TOTAL KNEE REVISION;  Surgeon: Newt Minion, MD;  Location: [facility];  Service: Orthopedics;  Laterality: Right;  Revision Right Total Knee Arthroplasty   Past Medical History  Diagnosis Date  . History of knee replacement, total   . Fever   . Breast lump   .  Breast discharge   . Breast pain   . Diabetes mellitus   . Thyroid disease   . Asthma   . Hiatal hernia   . Arthritis   . Depression   . Hypertension   . Bipolar 1 disorder   . Hypothyroidism   . Sleep apnea    BP 152/88 mmHg  Pulse 99  Resp 14  SpO2 94%  Opioid Risk Score:   Fall Risk Score: High Fall Risk (>13 points) (patient declined pamphlet during todays visit)`1  Depression screen PHQ 2/9  Depression screen PHQ 2/9 09/13/2014  Decreased Interest 2  Down, Depressed, Hopeless 0  PHQ - 2 Score 2  Altered sleeping 2  Tired, decreased energy 2  Change in appetite 2  Feeling bad or failure about yourself  0  Trouble concentrating 3  Moving slowly or fidgety/restless 2  Suicidal thoughts 0  PHQ-9 Score 13     Review of Systems  Constitutional:       Night  sweats  Respiratory: Positive for apnea.   Cardiovascular: Positive for leg swelling.  Genitourinary: Positive for urgency and frequency.       Incontinence  Musculoskeletal: Positive for gait problem.  Neurological: Positive for numbness.  All other systems reviewed and are negative.      Objective:   Physical Exam  Constitutional: She is oriented to person, place, and time. She appears well-developed and well-nourished.  HENT:  Head: Normocephalic and atraumatic.  Neck: Normal range of motion. Neck supple.  Cardiovascular: Normal rate and regular rhythm.   Pulmonary/Chest: Effort normal and breath sounds normal.  Musculoskeletal:  Normal Muscle Bulk and Muscle Testing Reveals: Upper Extremities: Full ROM and Muscle strength 5/5 Lower Extremities: Full ROM and Muscle strength 5/5 Bilateral Lower Extremities Flexion Produces pain into Patella's. Bilateral Knee sleeves intact. Arises from chair with ease/ Using straight cane for support Antalgic gait  Neurological: She is alert and oriented to person, place, and time.  Skin: Skin is warm and dry.  Psychiatric: She has a normal mood and affect.  Nursing note and vitals reviewed.         Assessment & Plan:  1. Right Knee Pain, History of OA and History of knee replacement with revision:  Refilled: MS CONTIN 15 mg one tablet every 12 hours #60 and Oxycodone 10 mg one tablet every 6 hours as needed for pain #120  2. Morbid Obesity: Continue to Work on [employer] and weight loss.  3. DM2 with neuropathy: Continue with Voltaren gel and gabapentin   20 minutes of face to face patient care time was spent during this visit. All questions were encouraged and answered.

## 2014-09-14 LAB — PMP ALCOHOL METABOLITE (ETG): Ethyl Glucuronide (EtG): NEGATIVE ng/mL

## 2014-09-15 LAB — METHYLPHENIDATE METAB, QUANT, U: Ritalinic Acid: NEGATIVE ng/mL (ref <100)

## 2014-09-17 DIAGNOSIS — R262 Difficulty in walking, not elsewhere classified: Secondary | ICD-10-CM | POA: Diagnosis not present

## 2014-09-17 DIAGNOSIS — M25561 Pain in right knee: Secondary | ICD-10-CM | POA: Diagnosis not present

## 2014-09-17 DIAGNOSIS — M25461 Effusion, right knee: Secondary | ICD-10-CM | POA: Diagnosis not present

## 2014-09-17 DIAGNOSIS — M25661 Stiffness of right knee, not elsewhere classified: Secondary | ICD-10-CM | POA: Diagnosis not present

## 2014-09-19 DIAGNOSIS — R262 Difficulty in walking, not elsewhere classified: Secondary | ICD-10-CM | POA: Diagnosis not present

## 2014-09-19 DIAGNOSIS — M25561 Pain in right knee: Secondary | ICD-10-CM | POA: Diagnosis not present

## 2014-09-19 DIAGNOSIS — M25461 Effusion, right knee: Secondary | ICD-10-CM | POA: Diagnosis not present

## 2014-09-19 DIAGNOSIS — M25661 Stiffness of right knee, not elsewhere classified: Secondary | ICD-10-CM | POA: Diagnosis not present

## 2014-09-19 LAB — BENZODIAZEPINES (GC/LC/MS), URINE
Alprazolam metabolite (GC/LC/MS), ur confirm: NEGATIVE ng/mL — AB (ref <25)
Clonazepam metabolite (GC/LC/MS), ur confirm: NEGATIVE ng/mL (ref <25)
Flurazepam metabolite (GC/LC/MS), ur confirm: NEGATIVE ng/mL (ref <50)
Lorazepam (GC/LC/MS), ur confirm: NEGATIVE ng/mL (ref <50)
Midazolam (GC/LC/MS), ur confirm: NEGATIVE ng/mL (ref <50)
Nordiazepam (GC/LC/MS), ur confirm: NEGATIVE ng/mL (ref <50)
Oxazepam (GC/LC/MS), ur confirm: 99 ng/mL — AB (ref <50)
Temazepam (GC/LC/MS), ur confirm: NEGATIVE ng/mL (ref <50)
Triazolam metabolite (GC/LC/MS), ur confirm: NEGATIVE ng/mL (ref <50)

## 2014-09-19 LAB — OPIATES/OPIOIDS (LC/MS-MS)
Codeine Urine: NEGATIVE ng/mL (ref <50)
Hydrocodone: NEGATIVE ng/mL (ref <50)
Hydromorphone: NEGATIVE ng/mL — AB (ref <50)
Morphine Urine: 5262 ng/mL (ref <50)
Norhydrocodone, Ur: NEGATIVE ng/mL (ref <50)
Noroxycodone, Ur: 2066 ng/mL (ref <50)
Oxycodone, ur: 865 ng/mL (ref <50)
Oxymorphone: 122 ng/mL (ref <50)

## 2014-09-19 LAB — OXYCODONE, URINE (LC/MS-MS)
Noroxycodone, Ur: 2066 ng/mL (ref <50)
Oxycodone, ur: 865 ng/mL (ref <50)
Oxymorphone: 122 ng/mL (ref <50)

## 2014-09-19 LAB — CANNABANOIDS (GC/LC/MS), URINE: THC-COOH (GC/LC/MS), ur confirm: 45 ng/mL — AB (ref <5)

## 2014-09-19 LAB — FENTANYL (GC/LC/MS), URINE
Fentanyl, confirm: NEGATIVE ng/mL (ref <0.5)
Norfentanyl, confirm: NEGATIVE ng/mL (ref <0.5)

## 2014-09-20 LAB — PRESCRIPTION MONITORING PROFILE (SOLSTAS)
Amphetamine/Meth: NEGATIVE ng/mL
Barbiturate Screen, Urine: NEGATIVE ng/mL
Buprenorphine, Urine: NEGATIVE ng/mL
Carisoprodol, Urine: NEGATIVE ng/mL
Cocaine Metabolites: NEGATIVE ng/mL
Creatinine, Urine: 84.71 mg/dL (ref >20.0)
MDMA URINE: NEGATIVE ng/mL
Meperidine, Ur: NEGATIVE ng/mL
Methadone Screen, Urine: NEGATIVE ng/mL
Nitrites, Initial: NEGATIVE ug/mL
Propoxyphene: NEGATIVE ng/mL
Tapentadol, urine: NEGATIVE ng/mL
Tramadol Scrn, Ur: NEGATIVE ng/mL
Zolpidem, Urine: NEGATIVE ng/mL
pH, Initial: 5.7 pH (ref 4.5–8.9)

## 2014-09-24 DIAGNOSIS — M5440 Lumbago with sciatica, unspecified side: Secondary | ICD-10-CM | POA: Diagnosis not present

## 2014-09-26 DIAGNOSIS — M25561 Pain in right knee: Secondary | ICD-10-CM | POA: Diagnosis not present

## 2014-09-26 DIAGNOSIS — R262 Difficulty in walking, not elsewhere classified: Secondary | ICD-10-CM | POA: Diagnosis not present

## 2014-09-26 DIAGNOSIS — M25661 Stiffness of right knee, not elsewhere classified: Secondary | ICD-10-CM | POA: Diagnosis not present

## 2014-09-26 DIAGNOSIS — M25461 Effusion, right knee: Secondary | ICD-10-CM | POA: Diagnosis not present

## 2014-09-26 NOTE — Progress Notes (Signed)
Urine drug screen for this encounter is inconsistent . Positive for prescribed medication but also positive for THC and oxazepam which is metabolite of benzodiazepine she is not prescribed.

## 2014-10-07 ENCOUNTER — Encounter: Payer: Medicare Other | Attending: Surgery

## 2014-10-07 DIAGNOSIS — Z6841 Body Mass Index (BMI) 40.0 and over, adult: Secondary | ICD-10-CM | POA: Insufficient documentation

## 2014-10-07 DIAGNOSIS — Z01818 Encounter for other preprocedural examination: Secondary | ICD-10-CM | POA: Diagnosis not present

## 2014-10-07 DIAGNOSIS — E119 Type 2 diabetes mellitus without complications: Secondary | ICD-10-CM | POA: Diagnosis not present

## 2014-10-07 NOTE — Progress Notes (Signed)
Dr Hassell Done  NEED PRE OP ORDERS PLEASE  THANKS

## 2014-10-09 NOTE — Progress Notes (Signed)
  Pre-Operative Nutrition Class:  Appt start time: 7416   End time:  1830.  Patient was seen on 10/07/14 for Pre-Operative Bariatric Surgery Education at [facility].   Surgery date: 10/22/14 Surgery type: RYGB Start weight at [facility]: 326.5 on 11/08/13 Weight today: 325 lbs  TANITA  BODY COMP RESULTS  10/07/14   BMI (kg/m^2) 54.1   Fat Mass (lbs) 189   Fat Free Mass (lbs) 136   Total Body Water (lbs) 99.5   Samples given per MNT protocol. Patient educated on appropriate usage: Premier protein shake (strawberry - qty 1) Lot #: [id number] Exp: 01/2015  Unjury protein powder (chocolate - qty 1) Lot #: [id number] Exp: 03/2015  PB2 (chocolate - qty 1) Lot #: none Exp: 04/2015  Bariatric Advantage Calcium Citrate chew (caramel - qty 1) Lot #: [id number] Exp: 10/2014   The following the learning objectives were met by the patient during this course:  Identify Pre-Op Dietary Goals and will begin 2 weeks pre-operatively  Identify appropriate sources of fluids and proteins   State protein recommendations and appropriate sources pre and post-operatively  Identify Post-Operative Dietary Goals and will follow for 2 weeks post-operatively  Identify appropriate multivitamin and calcium sources  Describe the need for physical activity post-operatively and will follow MD recommendations  State when to call healthcare provider regarding medication questions or post-operative complications  Handouts given during class include:  Pre-Op Bariatric Surgery Diet Handout  Protein Shake Handout  Post-Op Bariatric Surgery Nutrition Handout  [facility] Program Information Flyer  Support Group Information Flyer  [facility] Bariatric Supplements Price List  Follow-Up Plan: Patient will follow-up at [facility] 2 weeks post operatively for diet advancement per MD.

## 2014-10-11 ENCOUNTER — Ambulatory Visit: Payer: Self-pay | Admitting: Surgery

## 2014-10-11 DIAGNOSIS — N3281 Overactive bladder: Secondary | ICD-10-CM | POA: Diagnosis not present

## 2014-10-11 NOTE — H&P (Signed)
Caroline Williams. Caroline Williams 06/12/2014 2:23 PM Location: [facility] Patient #: [id number] DOB: November 25, 1960 Single / Language: Caroline Williams / Race: Undefined Female  History of Present Illness Caroline Key B. Hassell Done MD; 06/12/2014 3:28 PM) Patient words: Interested in gastric bypass.  The patient is a 54 year old female who presents for a bariatric surgery evaluation. Has been one of our information seminars. She is followed by Dr. Chapman Fitch. She has been over the procedures and is interested in a Roux-en-Y gastric bypass. Her medical conditions include osteoarthritis, diabetes, GERD, high cholesterol, hypertension, thyroid problems, and obstructive sleep apnea for which she wears a mask. She has tried numerous weight loss attempts including Optifast, inpatient weight program, hCG injections, Slim fast, liquid protein and many others. She is been working with Ponciano Ort at [facility] with good results. She denies any history of DVT. She denies any problems with her heart or lungs. She has had multiple abdominal surgeries including 4 C-sections, an appendectomy and a hernia repair. I gave her the informed consent for Roux-en-Y gastric bypass and went over the procedure again in detail with her using the flip chart. We'll go ahead and begin the workup for Roux-en-Y gastric bypass.   Allergies Briant Cedar, CMA; 06/12/2014 3:03 PM) Sulfa Drugs Aspircaf *ANALGESICS - NonNarcotic*  Medication History Briant Cedar, CMA; 06/12/2014 3:04 PM) Methylphenidate HCl (20MG Tablet, Oral) Active. Morphine Sulfate ER (15MG Tablet ER, Oral) Active. OxyCODONE HCl (10MG Tablet, Oral) Active. ALPRAZolam (1MG Tablet, Oral) Active. Diazepam (10MG Tablet, Oral) Active. Accu-Chek Aviva Plus (w/Device Kit,) Active. Accu-Chek Aviva Plus (In Vitro) Active. Accu-Chek Aviva (In Vitro) Active. Albuterol Sulfate ((2.5 MG/3ML)0.083% Nebulized Soln, Inhalation) Active. Alcohol Prep (70% Pad,) Active. Assure  Comfort Lancets 30G Active. BuPROPion HCl ER (XL) (300MG Tablet ER 24HR, Oral) Active. Crestor (20MG Tablet, Oral) Active. EpiPen 2-Pak (0.3MG/0.3ML Soln Auto-inj, Injection) Active. Fluconazole (150MG Tablet, Oral) Active. Gabapentin (600MG Tablet, Oral) Active. Ibuprofen (800MG Tablet, Oral) Active. Adjustable Lancing Device Active. Levothyroxine Sodium (125MCG Tablet, Oral) Active. Lidocaine (5% Ointment, External) Active. Losartan Potassium-HCTZ (100-25MG Tablet, Oral) Active. MethylPREDNISolone (Pak) (4MG Tablet, Oral) Active. Nabumetone (750MG Tablet, Oral) Active. QUEtiapine Fumarate (100MG Tablet, Oral) Active. Voltaren (1% Gel, Transdermal) Active. Zetia (10MG Tablet, Oral) Active.  Vitals Briant Cedar CMA; 06/12/2014 3:05 PM) 06/12/2014 3:04 PM Weight: 306.25 lb Height: 65in Body Surface Area: 2.52 m Body Mass Index: 50.96 kg/m Temp.: 98.32F  Pulse: 90 (Regular)  BP: 126/90 (Sitting, Left Arm, Standard)    Physical Exam (Eileene Kisling B. Hassell Done MD; 06/12/2014 3:33 PM) General Note: Obese well kept AA female   Integumentary Note: no skin lesions   Head and Neck Note: normocephalic eyes -PERRL   Eye Note: PERRL   Chest and Lung Exam Chest and lung exam reveals -normal excursion with symmetric chest walls, quiet, even and easy respiratory effort with no use of accessory muscles, normal resonance, no flatness or dullness, non-tender and normal tactile fremitus and on auscultation, normal breath sounds, no adventitious sounds and normal vocal resonance.  Breast - Did not examine.  Cardiovascular Note: SR without murmurs   Abdomen Note: Obese and nontender   Neuropsychiatric Thought Processes/Cognitive Function-aware of current events, recalls past history and the patient demonstrates an appropriate vocabulary level.    Assessment & Plan Caroline Key B. Hassell Done MD; 06/12/2014 3:34 PM) MORBID OBESITY (278.01   E66.01)  Plan: Roux en Y gastric bypass on May 23rd.

## 2014-10-14 ENCOUNTER — Encounter: Payer: Medicare Other | Attending: Physical Medicine & Rehabilitation | Admitting: Physical Medicine & Rehabilitation

## 2014-10-14 ENCOUNTER — Encounter: Payer: Self-pay | Admitting: Physical Medicine & Rehabilitation

## 2014-10-14 VITALS — BP 163/97 | HR 87 | Resp 16

## 2014-10-14 DIAGNOSIS — M1711 Unilateral primary osteoarthritis, right knee: Secondary | ICD-10-CM | POA: Diagnosis present

## 2014-10-14 DIAGNOSIS — E1140 Type 2 diabetes mellitus with diabetic neuropathy, unspecified: Secondary | ICD-10-CM | POA: Diagnosis not present

## 2014-10-14 DIAGNOSIS — G8929 Other chronic pain: Secondary | ICD-10-CM | POA: Insufficient documentation

## 2014-10-14 DIAGNOSIS — M25561 Pain in right knee: Secondary | ICD-10-CM | POA: Diagnosis present

## 2014-10-14 MED ORDER — MORPHINE SULFATE ER 15 MG PO TBCR: 15.0000 mg | EXTENDED_RELEASE_TABLET | Freq: Two times a day (BID) | ORAL | Status: DC

## 2014-10-14 MED ORDER — OXYCODONE HCL 10 MG PO TABS: 10.0000 mg | ORAL_TABLET | Freq: Four times a day (QID) | ORAL | Status: DC | PRN

## 2014-10-14 NOTE — Patient Instructions (Signed)
LAYANI FORONDA  10/14/2014   Your procedure is scheduled on:    10/22/2014    Report to [facility] Main  Entrance and follow signs to               [place] at      0800 AM.  Call this number if you have problems the morning of surgery [phone]   Remember:  Do not eat food or drink liquids :After Midnight.     Take these medicines the morning of surgery with A SIP OF WATER:                                You may not have any metal on your body including hair pins and              piercings  Do not wear jewelry, make-up, lotions, powders or perfumes., deodorant.               Do not wear nail polish.  Do not shave  48 hours prior to surgery.     Do not bring valuables to the hospital. [facility].  Contacts, dentures or bridgework may not be worn into surgery.  Leave suitcase in the car. After surgery it may be brought to your room.         Special Instructions:coughing and deep breathing exercises, leg exercises               Please read over the following fact sheets you were given: _____________________________________________________________________             [facility] - Preparing for Surgery Before surgery, you can play an important role.  Because skin is not sterile, your skin needs to be as free of germs as possible.  You can reduce the number of germs on your skin by washing with CHG (chlorahexidine gluconate) soap before surgery.  CHG is an antiseptic cleaner which kills germs and bonds with the skin to continue killing germs even after washing. Please DO NOT use if you have an allergy to CHG or antibacterial soaps.  If your skin becomes reddened/irritated stop using the CHG and inform your nurse when you arrive at Short Stay. Do not shave (including legs and underarms) for at least 48 hours prior to the first CHG shower.  You may shave your face/neck. Please follow these instructions  carefully:  1.  Shower with CHG Soap the night before surgery and the  morning of Surgery.  2.  If you choose to wash your hair, wash your hair first as usual with your  normal  shampoo.  3.  After you shampoo, rinse your hair and body thoroughly to remove the  shampoo.                           4.  Use CHG as you would any other liquid soap.  You can apply chg directly  to the skin and wash                       Gently with a scrungie or clean washcloth.  5.  Apply the CHG Soap to your body ONLY  FROM THE NECK DOWN.   Do not use on face/ open                           Wound or open sores. Avoid contact with eyes, ears mouth and genitals (private parts).                       Wash face,  Genitals (private parts) with your normal soap.             6.  Wash thoroughly, paying special attention to the area where your surgery  will be performed.  7.  Thoroughly rinse your body with warm water from the neck down.  8.  DO NOT shower/wash with your normal soap after using and rinsing off  the CHG Soap.                9.  Pat yourself dry with a clean towel.            10.  Wear clean pajamas.            11.  Place clean sheets on your bed the night of your first shower and do not  sleep with pets. Day of Surgery : Do not apply any lotions/deodorants the morning of surgery.  Please wear clean clothes to the hospital/surgery center.  FAILURE TO FOLLOW THESE INSTRUCTIONS MAY RESULT IN THE CANCELLATION OF YOUR SURGERY PATIENT SIGNATURE_________________________________  NURSE SIGNATURE__________________________________  ________________________________________________________________________

## 2014-10-14 NOTE — Progress Notes (Signed)
Subjective:    Patient ID: Caroline Williams, female    DOB: Jan 09, 1961, 54 y.o.   MRN: [id number]  HPI  Caroline Williams is here in follow of her chronic knee pain. She has gastric bypass surgery planned at the end of the month by Dr. Hassell Done.   She has had some swelling along her medial right knee. She is wearing a right knee sleeve now for support  She is using the ms contin and oxycodone for pain control.     Pain Inventory Average Pain 9 Pain Right Now 6 My pain is aching  In the last 24 hours, has pain interfered with the following? General activity 8 Relation with others 8 Enjoyment of life 8 What TIME of day is your pain at its worst? all Sleep (in general) Poor  Pain is worse with: walking, inactivity and standing Pain improves with: rest, heat/ice, medication, TENS and injections Relief from Meds: no answer  Mobility use a cane use a walker how many minutes can you walk? 5 ability to climb steps?  no do you drive?  no  Function I need assistance with the following:  no answer  Neuro/Psych bladder control problems numbness  Prior Studies Any changes since last visit?  no  Physicians involved in your care Any changes since last visit?  no   Family History  Problem Relation Age of Onset  . Cancer Father     colon  . Stroke Father   . Heart disease Father   . Depression Sister    History   Social History  . Marital Status: Single    Spouse Name: N/A  . Number of Children: N/A  . Years of Education: N/A   Social History Main Topics  . Smoking status: Current Every Day Smoker -- 0.50 packs/day for 4 years    Types: Cigarettes  . Smokeless tobacco: Never Used  . Alcohol Use: No  . Drug Use: No  . Sexual Activity: Yes    Birth Control/ Protection: None   Other Topics Concern  . None   Social History Narrative   Past Surgical History  Procedure Laterality Date  . Cesarean section    . Breast lumpectomy      right  . Hernia repair    .  Appendectomy    . Abdominal hysterectomy      partial  . Knee surgery      right  . Myomectomy abdominal approach  1989  . Myomectomy abdominal approach  1989  . Cardiac catheterization      no significant CAD, nl LV function by 11/08/06 cath  . Total knee revision  06/21/2012    Procedure: TOTAL KNEE REVISION;  Surgeon: Newt Minion, MD;  Location: [facility];  Service: Orthopedics;  Laterality: Right;  Revision Right Total Knee Arthroplasty   Past Medical History  Diagnosis Date  . History of knee replacement, total   . Fever   . Breast lump   . Breast discharge   . Breast pain   . Diabetes mellitus   . Thyroid disease   . Asthma   . Hiatal hernia   . Arthritis   . Depression   . Hypertension   . Bipolar 1 disorder   . Hypothyroidism   . Sleep apnea    BP 163/97 mmHg  Pulse 87  Resp 16  SpO2 97%  Opioid Risk Score:   Fall Risk Score: High Fall Risk (>13 points) (reeducated and givne handout on fall  prevention since a fall has occurred)`1  Depression screen PHQ 2/9  Depression screen PHQ 2/9 09/13/2014  Decreased Interest 2  Down, Depressed, Hopeless 0  PHQ - 2 Score 2  Altered sleeping 2  Tired, decreased energy 2  Change in appetite 2  Feeling bad or failure about yourself  0  Trouble concentrating 3  Moving slowly or fidgety/restless 2  Suicidal thoughts 0  PHQ-9 Score 13     Review of Systems  Respiratory: Positive for apnea.   Genitourinary:       Bladder control problems  Neurological: Positive for numbness.  All other systems reviewed and are negative.      Objective:   Physical Exam  General: Alert and oriented x 3, No apparent distress. Morbidly obese  HEENT: Head is normocephalic, atraumatic, PERRLA, EOMI, sclera anicteric, oral mucosa pink and moist, dentition intact, ext ear canals clear,  Neck: Supple without JVD or lymphadenopathy  Heart: Reg rate and rhythm. No murmurs rubs or gallops  Chest: CTA bilaterally without wheezes, rales, or  rhonchi; no distress  Abdomen: Soft, non-tender, non-distended, bowel sounds positive.  Extremities: No clubbing, cyanosis, or edema. Pulses are 2+  Skin: Clean and intact without signs of breakdown. tka scar noted  Neuro: Pt is cognitively appropriate with normal insight, memory, and awareness. Cranial nerves 2-12 are intact. Sensory exam is normal except for impaired light touch over distal legs. Reflexes are 2+ in all 4's. Fine motor coordination is intact. No tremors. Motor function is grossly 5/5.  Musculoskeletal: has 90 degrees flexion right knee but not beyond. Extension is almost full but has difficulty extending against gravity. . She has pain with palpation over the medial tibial component of the prosthesis persists. Pes anserine tenderness/swelling? Minimal pain over the pain over the patella, lateral knee and femur. She has continued severe antalgia with weight bearing.  Psych: Pt's affect is appropriate. Pt is cooperative. She is quite pleasant.    Assessment & Plan:   1. Right knee pain, hx of OA and hx of knee replacement with revision --medial tibial lucency on recent xray. Potential bursitis as well 2. Morbid obesity  3. DM2 with neuropathy  4. Recent +THC on UDS   Plan:  1. Continue long acting agent ms contin 38mc q12 #60 and Oxycodone 10mg  q6 prn for breakthrough pain.  #120  -I reviewed her CSA. If there is another inconsistent results, it will mean discharge from our clinic. Pt understands and will comply going forward.  2. Ice to right knee/knee sleeve. 4. Continue with topical treatments which are appropriate.  5. HEP 6. Gastric bypass on 4/23  7. Follow up in 1 month---will tentatively schedule followup assuming she'll be out of the hospital. 30 minutes of face to face patient care time were spent during this visit. All questions were encouraged and answered.

## 2014-10-14 NOTE — Patient Instructions (Signed)
ICE TO YOUR AREAS AFFECTED WITH BURSITIS

## 2014-10-15 ENCOUNTER — Inpatient Hospital Stay (HOSPITAL_COMMUNITY)
Admission: RE | Admit: 2014-10-15 | Discharge: 2014-10-15 | Disposition: A | Discharge status: Home / Self Care | Payer: Medicare Other | Source: Ambulatory Visit

## 2014-10-17 DIAGNOSIS — Z6841 Body Mass Index (BMI) 40.0 and over, adult: Secondary | ICD-10-CM | POA: Diagnosis not present

## 2014-10-17 DIAGNOSIS — I10 Essential (primary) hypertension: Secondary | ICD-10-CM | POA: Diagnosis not present

## 2014-10-17 DIAGNOSIS — Z79899 Other long term (current) drug therapy: Secondary | ICD-10-CM | POA: Diagnosis not present

## 2014-10-17 DIAGNOSIS — G629 Polyneuropathy, unspecified: Secondary | ICD-10-CM | POA: Diagnosis not present

## 2014-10-17 DIAGNOSIS — E119 Type 2 diabetes mellitus without complications: Secondary | ICD-10-CM | POA: Diagnosis not present

## 2014-10-17 DIAGNOSIS — R269 Unspecified abnormalities of gait and mobility: Secondary | ICD-10-CM | POA: Diagnosis not present

## 2014-10-17 DIAGNOSIS — E039 Hypothyroidism, unspecified: Secondary | ICD-10-CM | POA: Diagnosis not present

## 2014-10-17 DIAGNOSIS — E785 Hyperlipidemia, unspecified: Secondary | ICD-10-CM | POA: Diagnosis not present

## 2014-10-31 ENCOUNTER — Inpatient Hospital Stay (HOSPITAL_COMMUNITY): Admission: RE | Admit: 2014-10-31 | Payer: Medicare Other | Source: Ambulatory Visit

## 2014-10-31 DIAGNOSIS — N3281 Overactive bladder: Secondary | ICD-10-CM | POA: Diagnosis not present

## 2014-11-01 DIAGNOSIS — R312 Other microscopic hematuria: Secondary | ICD-10-CM | POA: Diagnosis not present

## 2014-11-01 DIAGNOSIS — N3281 Overactive bladder: Secondary | ICD-10-CM | POA: Diagnosis not present

## 2014-11-01 DIAGNOSIS — N3644 Muscular disorders of urethra: Secondary | ICD-10-CM | POA: Diagnosis not present

## 2014-11-08 ENCOUNTER — Ambulatory Visit (INDEPENDENT_AMBULATORY_CARE_PROVIDER_SITE_OTHER): Payer: Medicare Other | Admitting: Podiatry

## 2014-11-08 ENCOUNTER — Telehealth: Payer: Self-pay | Admitting: *Deleted

## 2014-11-08 ENCOUNTER — Encounter: Payer: Self-pay | Admitting: Podiatry

## 2014-11-08 VITALS — BP 171/90 | HR 94 | Resp 16 | Ht 65.0 in | Wt 325.0 lb

## 2014-11-08 DIAGNOSIS — B351 Tinea unguium: Secondary | ICD-10-CM

## 2014-11-08 DIAGNOSIS — M79606 Pain in leg, unspecified: Secondary | ICD-10-CM

## 2014-11-08 DIAGNOSIS — L600 Ingrowing nail: Secondary | ICD-10-CM

## 2014-11-08 NOTE — Progress Notes (Signed)
   Subjective:    Patient ID: Caroline Williams, female    DOB: 1960/08/26, 54 y.o.   MRN: [id number]  HPI    Review of Systems  All other systems reviewed and are negative.      Objective:   Physical Exam        Assessment & Plan:

## 2014-11-08 NOTE — Telephone Encounter (Signed)
Pt states she sees Dr. Antony Blackbird for the management of her diabetes.  I told pt we would begin the process of ordering her diabetic shoes and call once confirmation had been received to schedule for her to be molded and measured for the shoes.  Pt states understanding.

## 2014-11-08 NOTE — Patient Instructions (Signed)

## 2014-11-08 NOTE — Telephone Encounter (Signed)
-----   Message from Clarene Reamer sent at 11/08/2014  1:11 PM EDT ----- Regarding: DIABETIC SHOES ORDERING PATIENT WOULD LIKE TO GET DIABETIC SHOES AND YOU PLEASE CONTACT HER

## 2014-11-14 ENCOUNTER — Encounter: Payer: Medicare Other | Attending: Physical Medicine & Rehabilitation | Admitting: Registered Nurse

## 2014-11-14 ENCOUNTER — Encounter: Payer: Self-pay | Admitting: Registered Nurse

## 2014-11-14 VITALS — BP 140/90 | HR 89 | Resp 14

## 2014-11-14 DIAGNOSIS — M545 Low back pain, unspecified: Secondary | ICD-10-CM

## 2014-11-14 DIAGNOSIS — M1711 Unilateral primary osteoarthritis, right knee: Secondary | ICD-10-CM | POA: Diagnosis present

## 2014-11-14 DIAGNOSIS — G8929 Other chronic pain: Secondary | ICD-10-CM | POA: Diagnosis not present

## 2014-11-14 DIAGNOSIS — M1712 Unilateral primary osteoarthritis, left knee: Secondary | ICD-10-CM | POA: Diagnosis not present

## 2014-11-14 DIAGNOSIS — M25561 Pain in right knee: Secondary | ICD-10-CM | POA: Diagnosis not present

## 2014-11-14 DIAGNOSIS — Z5181 Encounter for therapeutic drug level monitoring: Secondary | ICD-10-CM

## 2014-11-14 DIAGNOSIS — Z79899 Other long term (current) drug therapy: Secondary | ICD-10-CM

## 2014-11-14 MED ORDER — MORPHINE SULFATE ER 15 MG PO TBCR: 15.0000 mg | EXTENDED_RELEASE_TABLET | Freq: Two times a day (BID) | ORAL | Status: DC

## 2014-11-14 MED ORDER — OXYCODONE HCL 10 MG PO TABS: 10.0000 mg | ORAL_TABLET | Freq: Four times a day (QID) | ORAL | Status: DC | PRN

## 2014-11-14 NOTE — Progress Notes (Signed)
Subjective:    Patient ID: Caroline Williams, female    DOB: 05/09/1961, 54 y.o.   MRN: [id number]  HPI: Ms.Munyon is a 54 year old female who returns for follow up for chronic pain and medication refill. She says her pain is located in her lower back and bilateral knee's. She rates her pain 6. Her current exercise regime is walking.  She's attending [employer] full time majoring in [employer]. She's schedule for Gastric Bypass on 11/25/2014.  Pain Inventory Average Pain 7 Pain Right Now 6 My pain is stabbing and aching  In the last 24 hours, has pain interfered with the following? General activity 9 Relation with others 8 Enjoyment of life 9 What TIME of day is your pain at its worst? morning,daytime, evening Sleep (in general) Poor  Pain is worse with: walking, inactivity and standing Pain improves with: medication, TENS and injections Relief from Meds: 9  Mobility walk with assistance use a cane use a walker how many minutes can you walk? 5-10 use a wheelchair Do you have any goals in this area?  yes  Function disabled: date disabled .  Neuro/Psych bladder control problems trouble walking anxiety  Prior Studies Any changes since last visit?  no  Physicians involved in your care Any changes since last visit?  no   Family History  Problem Relation Age of Onset  . Cancer Father     colon  . Stroke Father   . Heart disease Father   . Depression Sister    History   Social History  . Marital Status: Single    Spouse Name: N/A  . Number of Children: N/A  . Years of Education: N/A   Social History Main Topics  . Smoking status: Current Every Day Smoker -- 0.50 packs/day for 4 years    Types: Cigarettes  . Smokeless tobacco: Never Used  . Alcohol Use: No  . Drug Use: No  . Sexual Activity: Yes    Birth Control/ Protection: None   Other Topics Concern  . None   Social History Narrative   Past Surgical History  Procedure Laterality Date  . Cesarean  section    . Breast lumpectomy      right  . Hernia repair    . Appendectomy    . Abdominal hysterectomy      partial  . Knee surgery      right  . Myomectomy abdominal approach  1989  . Myomectomy abdominal approach  1989  . Cardiac catheterization      no significant CAD, nl LV function by 11/08/06 cath  . Total knee revision  06/21/2012    Procedure: TOTAL KNEE REVISION;  Surgeon: Newt Minion, MD;  Location: [place];  Service: Orthopedics;  Laterality: Right;  Revision Right Total Knee Arthroplasty   Past Medical History  Diagnosis Date  . History of knee replacement, total   . Fever   . Breast lump   . Breast discharge   . Breast pain   . Diabetes mellitus   . Thyroid disease   . Asthma   . Hiatal hernia   . Arthritis   . Depression   . Hypertension   . Bipolar 1 disorder   . Hypothyroidism   . Sleep apnea   . Graves disease   . Graves disease    BP 140/90 mmHg  Pulse 89  Resp 14  SpO2 95%  Opioid Risk Score:   Fall Risk Score: High Fall Risk (>13  points)`1  Depression screen PHQ 2/9  Depression screen PHQ 2/9 09/13/2014  Decreased Interest 2  Down, Depressed, Hopeless 0  PHQ - 2 Score 2  Altered sleeping 2  Tired, decreased energy 2  Change in appetite 2  Feeling bad or failure about yourself  0  Trouble concentrating 3  Moving slowly or fidgety/restless 2  Suicidal thoughts 0  PHQ-9 Score 13     Review of Systems  Endocrine:       High blood sugar  Genitourinary:       Overactive bladder  Musculoskeletal: Positive for gait problem.  Psychiatric/Behavioral: The patient is nervous/anxious.   All other systems reviewed and are negative.      Objective:   Physical Exam  Constitutional: She is oriented to person, place, and time. She appears well-developed and well-nourished.  HENT:  Head: Normocephalic and atraumatic.  Neck: Normal range of motion. Neck supple.  Cardiovascular: Normal rate and regular rhythm.   Pulmonary/Chest: Effort normal  and breath sounds normal.  Musculoskeletal:  Normal Muscle Bulk and Muscle Testing Reveals: Upper Extremities: Decreased ROM 90 Degrees and Muscle strength 5/5 Bilateral AC Joint Tenderness Lumbar Paraspinal Tenderness: L-3- L-5 Lower Extremities: Full ROM and Muscle Strength 5/5 Bilateral Lower Extremities Flexion Produces pain into Patella's Bilateral Knee Sleeves Intact Arises from chair slowly/ using straight cane for support Antalgic Gait   Neurological: She is alert and oriented to person, place, and time.  Skin: Skin is warm and dry.  Psychiatric: She has a normal mood and affect.  Nursing note and vitals reviewed.         Assessment & Plan:  1. Right Knee Pain, History of OA and History of knee replacement with revision:  Refilled: MS CONTIN 15 mg one tablet every 12 hours #60 and Oxycodone 10 mg one tablet every 6 hours as needed for pain #120. Second script given to accommodate scheduled appointment. 2. Morbid Obesity: Continue to Work on [employer] and weight loss. Scheduled for Bariatric Surgery 11/25/14 3. DM2 with neuropathy: Continue with Voltaren gel and gabapentin   20 minutes of face to face patient care time was spent during this visit. All questions were encouraged and answered

## 2014-11-19 ENCOUNTER — Ambulatory Visit: Payer: Medicare Other

## 2014-11-19 NOTE — Patient Instructions (Addendum)
THI KLICH  11/19/2014   Your procedure is scheduled on:   11/25/2014    Report to [facility] Main  Entrance and follow signs to               [place] at    [place].  Call this number if you have problems the morning of surgery [phone]   Remember: ONLY 1 PERSON MAY GO WITH YOU TO SHORT STAY TO GET  READY MORNING OF [place].  Do not eat food or drink liquids :After Midnight.     Take these medicines the morning of surgery with A SIP OF WATER:    Advair diskus adn bring, Albuterol Inhaler and bring, Xanax, Flonase if needed, Xyzal if needed, Synthroid, Prilosec if needed,                                You may not have any metal on your body including hair pins and              piercings  Do not wear jewelry, make-up, lotions, powders or perfumes, deodorant             Do not wear nail polish.  Do not shave  48 hours prior to surgery.                Do not bring valuables to the hospital. [place].  Contacts, dentures or bridgework may not be worn into surgery.  Leave suitcase in the car. After surgery it may be brought to your room.     Bring C-Pap mask and tubing    Special Instructions: coughing and deep breathing exercises, leg exercises               Please read over the following fact sheets you were given: _____________________________________________________________________             [facility] - Preparing for Surgery Before surgery, you can play an important role.  Because skin is not sterile, your skin needs to be as free of germs as possible.  You can reduce the number of germs on your skin by washing with CHG (chlorahexidine gluconate) soap before surgery.  CHG is an antiseptic cleaner which kills germs and bonds with the skin to continue killing germs even after washing. Please DO NOT use if you have an allergy to CHG or antibacterial soaps.  If your skin becomes  reddened/irritated stop using the CHG and inform your nurse when you arrive at Short Stay. Do not shave (including legs and underarms) for at least 48 hours prior to the first CHG shower.  You may shave your face/neck. Please follow these instructions carefully:  1.  Shower with CHG Soap the night before surgery and the  morning of Surgery.  2.  If you choose to wash your hair, wash your hair first as usual with your  normal  shampoo.  3.  After you shampoo, rinse your hair and body thoroughly to remove the  shampoo.                           4.  Use CHG as you would any other liquid soap.  You can apply chg directly  to the skin and wash                       Gently with a scrungie or clean washcloth.  5.  Apply the CHG Soap to your body ONLY FROM THE NECK DOWN.   Do not use on face/ open                           Wound or open sores. Avoid contact with eyes, ears mouth and genitals (private parts).                       Wash face,  Genitals (private parts) with your normal soap.             6.  Wash thoroughly, paying special attention to the area where your surgery  will be performed.  7.  Thoroughly rinse your body with warm water from the neck down.  8.  DO NOT shower/wash with your normal soap after using and rinsing off  the CHG Soap.                9.  Pat yourself dry with a clean towel.            10.  Wear clean pajamas.            11.  Place clean sheets on your bed the night of your first shower and do not  sleep with pets. Day of Surgery : Do not apply any lotions/deodorants the morning of surgery.  Please wear clean clothes to the hospital/surgery center.  FAILURE TO FOLLOW THESE INSTRUCTIONS MAY RESULT IN THE CANCELLATION OF YOUR SURGERY PATIENT SIGNATURE_________________________________  NURSE SIGNATURE__________________________________  ________________________________________________________________________

## 2014-11-21 ENCOUNTER — Encounter (HOSPITAL_COMMUNITY)
Admission: RE | Admit: 2014-11-21 | Discharge: 2014-11-21 | Disposition: A | Discharge status: Home / Self Care | Payer: Medicare Other | Source: Ambulatory Visit | Attending: Surgery | Admitting: Surgery

## 2014-11-21 ENCOUNTER — Encounter (HOSPITAL_COMMUNITY): Payer: Self-pay

## 2014-11-21 DIAGNOSIS — Z01818 Encounter for other preprocedural examination: Secondary | ICD-10-CM | POA: Insufficient documentation

## 2014-11-21 HISTORY — DX: Anxiety disorder, unspecified: F41.9

## 2014-11-21 HISTORY — DX: Pneumonia, unspecified organism: J18.9

## 2014-11-21 HISTORY — DX: Gastro-esophageal reflux disease without esophagitis: K21.9

## 2014-11-21 LAB — CBC WITH DIFFERENTIAL/PLATELET
Basophils Absolute: 0 10*3/uL (ref 0.0–0.1)
Basophils Relative: 1 % (ref 0–1)
Eosinophils Absolute: 0.3 10*3/uL (ref 0.0–0.7)
Eosinophils Relative: 4 % (ref 0–5)
HCT: 40.8 % (ref 36.0–46.0)
Hemoglobin: 12.9 g/dL (ref 12.0–15.0)
Lymphocytes Relative: 31 % (ref 12–46)
Lymphs Abs: 2.3 10*3/uL (ref 0.7–4.0)
MCH: 25.1 pg — ABNORMAL LOW (ref 26.0–34.0)
MCHC: 31.6 g/dL (ref 30.0–36.0)
MCV: 79.5 fL (ref 78.0–100.0)
Monocytes Absolute: 0.4 10*3/uL (ref 0.1–1.0)
Monocytes Relative: 6 % (ref 3–12)
Neutro Abs: 4.3 10*3/uL (ref 1.7–7.7)
Neutrophils Relative %: 58 % (ref 43–77)
Platelets: 306 10*3/uL (ref 150–400)
RBC: 5.13 MIL/uL — ABNORMAL HIGH (ref 3.87–5.11)
RDW: 17.5 % — ABNORMAL HIGH (ref 11.5–15.5)
WBC: 7.3 10*3/uL (ref 4.0–10.5)

## 2014-11-21 LAB — COMPREHENSIVE METABOLIC PANEL
ALT: 17 U/L (ref 14–54)
AST: 20 U/L (ref 15–41)
Albumin: 3.8 g/dL (ref 3.5–5.0)
Alkaline Phosphatase: 97 U/L (ref 38–126)
Anion gap: 10 (ref 5–15)
BUN: 15 mg/dL (ref 6–20)
CO2: 30 mmol/L (ref 22–32)
Calcium: 9.5 mg/dL (ref 8.9–10.3)
Chloride: 98 mmol/L — ABNORMAL LOW (ref 101–111)
Creatinine, Ser: 0.8 mg/dL (ref 0.44–1.00)
GFR calc Af Amer: >60 mL/min (ref >60)
GFR calc non Af Amer: >60 mL/min (ref >60)
Glucose, Bld: 83 mg/dL (ref 65–99)
Potassium: 3.5 mmol/L (ref 3.5–5.1)
Sodium: 138 mmol/L (ref 135–145)
Total Bilirubin: 0.2 mg/dL — ABNORMAL LOW (ref 0.3–1.2)
Total Protein: 7.5 g/dL (ref 6.5–8.1)

## 2014-11-21 NOTE — Progress Notes (Addendum)
11-21-14 preop appointment, pt. Advised she identifies with Jehovah's Witness and refuses blood transfusion.  Refusal of all blood and/or blood products consent signed.  Called Dr. Rodman Key Martin's office and talked to Conni Slipper to inform Dr. Hassell Done about patients blood refusal.  Patients surgery is on 11-25-14. 11-21-14 Blood Refusal Consent faxed to [place]. Documented in MD Orders and under FYI 11-21-14 Will need new blood refusal consent form signed because  Need to RN'S to witness patients signature.  In chart for Pt. To sign on 11-25-14 morning of surgery.  All other tasks have been completed.

## 2014-11-21 NOTE — Progress Notes (Addendum)
LOV 10-14-14 Dr. Naaman Plummer (chronic pain) New pain management physician - Dr. Maryjean Ka - to be seen on 12-11-14 PCP - Tacey Ruiz, MD

## 2014-11-21 NOTE — Progress Notes (Signed)
EKG 08-20-14 EPIC CXR 07-19-14 EPIC C-Pap - advised pt. To bring mask and tubing

## 2014-11-22 DIAGNOSIS — F250 Schizoaffective disorder, bipolar type: Secondary | ICD-10-CM | POA: Diagnosis not present

## 2014-11-22 LAB — NO BLOOD PRODUCTS

## 2014-11-24 NOTE — H&P (Signed)
Caroline Williams. Eden Lathe Location: [facility] Patient #: [id number] DOB: Apr 16, 1961 Single / Language: Cleophus Molt / Race: Undefined Female  History of Present Illness  Patient words: Interested in gastric bypass.  The patient is a 54 year old female who presents for a bariatric surgery evaluation. Has been one of our information seminars. She is followed by Dr. Chapman Fitch. She has been over the procedures and is interested in a Roux-en-Y gastric bypass. Her medical conditions include osteoarthritis, diabetes, GERD, high cholesterol, hypertension, thyroid problems, and obstructive sleep apnea for which she wears a mask. She has tried numerous weight loss attempts including Optifast, inpatient weight program, hCG injections, Slim fast, liquid protein and many others. She is been working with Ponciano Ort at [facility] with good results. She denies any history of DVT. She denies any problems with her heart or lungs. She has had multiple abdominal surgeries including 4 C-sections, an appendectomy and a hernia repair. I gave her the informed consent for Roux-en-Y gastric bypass and went over the procedure again in detail with her using the flip chart. We'll go ahead and begin the workup for Roux-en-Y gastric bypass.  She was seen back in the office on April 29th and questions regarding procedure explained.  She is about to go on her first cruise.    She presents today for roux en Y gastric bypass.     Allergies Briant Cedar, CMA; 06/12/2014 3:03 PM) Sulfa Drugs Aspircaf *ANALGESICS - NonNarcotic*  Medication History Briant Cedar, CMA; 06/12/2014 3:04 PM) Methylphenidate HCl (20MG Tablet, Oral) Active. Morphine Sulfate ER (15MG Tablet ER, Oral) Active. OxyCODONE HCl (10MG Tablet, Oral) Active. ALPRAZolam (1MG Tablet, Oral) Active. Diazepam (10MG Tablet, Oral) Active. Accu-Chek Aviva Plus (w/Device Kit,) Active. Accu-Chek Aviva Plus (In Vitro) Active. Accu-Chek Aviva (In  Vitro) Active. Albuterol Sulfate ((2.5 MG/3ML)0.083% Nebulized Soln, Inhalation) Active. Alcohol Prep (70% Pad,) Active. Assure Comfort Lancets 30G Active. BuPROPion HCl ER (XL) (300MG Tablet ER 24HR, Oral) Active. Crestor (20MG Tablet, Oral) Active. EpiPen 2-Pak (0.3MG/0.3ML Soln Auto-inj, Injection) Active. Fluconazole (150MG Tablet, Oral) Active. Gabapentin (600MG Tablet, Oral) Active. Ibuprofen (800MG Tablet, Oral) Active. Adjustable Lancing Device Active. Levothyroxine Sodium (125MCG Tablet, Oral) Active. Lidocaine (5% Ointment, External) Active. Losartan Potassium-HCTZ (100-25MG Tablet, Oral) Active. MethylPREDNISolone (Pak) (4MG Tablet, Oral) Active. Nabumetone (750MG Tablet, Oral) Active. QUEtiapine Fumarate (100MG Tablet, Oral) Active. Voltaren (1% Gel, Transdermal) Active. Zetia (10MG Tablet, Oral) Active.  Vitals Briant Cedar CMA; 06/12/2014 3:05 PM) 06/12/2014 3:04 PM Weight: 306.25 lb Height: 65in Body Surface Area: 2.52 m Body Mass Index: 50.96 kg/m Temp.: 98.51F  Pulse: 90 (Regular)  BP: 126/90 (Sitting, Left Arm, Standard)    Physical Exam (Americus Perkey B. Hassell Done MD;  General Note: Obese well kept AA female   Integumentary Note: no skin lesions   Head and Neck Note: normocephalic eyes -PERRL   Eye Note: PERRL   Chest and Lung Exam Chest and lung exam reveals -normal excursion with symmetric chest walls, quiet, even and easy respiratory effort with no use of accessory muscles, normal resonance, no flatness or dullness, non-tender and normal tactile fremitus and on auscultation, normal breath sounds, no adventitious sounds and normal vocal resonance.  Breast - Did not examine.  Cardiovascular Note: SR without murmurs   Abdomen Note: Obese and nontender   Neuropsychiatric Thought Processes/Cognitive Function-aware of current events, recalls past history and the patient demonstrates an appropriate  vocabulary level.    Assessment & Plan  MORBID OBESITY (278.01  E66.01) Plan:  Roux en Y gastric bypass  Matt B. Hassell Done, MD, [facility] [phone] beeper [phone]  11/24/2014 9:38 AM

## 2014-11-25 ENCOUNTER — Encounter (HOSPITAL_COMMUNITY)
Admission: RE | Disposition: A | Discharge status: Home / Self Care | Payer: Self-pay | Source: Ambulatory Visit | Attending: Surgery

## 2014-11-25 ENCOUNTER — Inpatient Hospital Stay (HOSPITAL_COMMUNITY): Payer: Medicare Other | Admitting: Certified Registered Nurse Anesthetist

## 2014-11-25 ENCOUNTER — Encounter (HOSPITAL_COMMUNITY): Payer: Self-pay | Admitting: *Deleted

## 2014-11-25 ENCOUNTER — Inpatient Hospital Stay (HOSPITAL_COMMUNITY)
Admission: RE | Admit: 2014-11-25 | Discharge: 2014-11-28 | DRG: 621 | Disposition: A | Discharge status: Home / Self Care | Payer: Medicare Other | Source: Ambulatory Visit | Attending: Surgery | Admitting: Surgery

## 2014-11-25 ENCOUNTER — Inpatient Hospital Stay (HOSPITAL_COMMUNITY): Payer: Medicare Other

## 2014-11-25 DIAGNOSIS — G4733 Obstructive sleep apnea (adult) (pediatric): Secondary | ICD-10-CM | POA: Diagnosis present

## 2014-11-25 DIAGNOSIS — Z6841 Body Mass Index (BMI) 40.0 and over, adult: Secondary | ICD-10-CM

## 2014-11-25 DIAGNOSIS — Z9884 Bariatric surgery status: Secondary | ICD-10-CM | POA: Diagnosis not present

## 2014-11-25 DIAGNOSIS — E780 Pure hypercholesterolemia: Secondary | ICD-10-CM | POA: Diagnosis present

## 2014-11-25 DIAGNOSIS — I10 Essential (primary) hypertension: Secondary | ICD-10-CM | POA: Diagnosis present

## 2014-11-25 DIAGNOSIS — F319 Bipolar disorder, unspecified: Secondary | ICD-10-CM | POA: Diagnosis present

## 2014-11-25 DIAGNOSIS — Z01812 Encounter for preprocedural laboratory examination: Secondary | ICD-10-CM

## 2014-11-25 DIAGNOSIS — J9811 Atelectasis: Secondary | ICD-10-CM | POA: Diagnosis not present

## 2014-11-25 DIAGNOSIS — M1990 Unspecified osteoarthritis, unspecified site: Secondary | ICD-10-CM | POA: Diagnosis present

## 2014-11-25 DIAGNOSIS — K219 Gastro-esophageal reflux disease without esophagitis: Secondary | ICD-10-CM | POA: Diagnosis present

## 2014-11-25 DIAGNOSIS — Z87891 Personal history of nicotine dependence: Secondary | ICD-10-CM | POA: Diagnosis not present

## 2014-11-25 DIAGNOSIS — R0989 Other specified symptoms and signs involving the circulatory and respiratory systems: Secondary | ICD-10-CM

## 2014-11-25 DIAGNOSIS — J45909 Unspecified asthma, uncomplicated: Secondary | ICD-10-CM | POA: Diagnosis present

## 2014-11-25 DIAGNOSIS — Z48815 Encounter for surgical aftercare following surgery on the digestive system: Secondary | ICD-10-CM | POA: Diagnosis not present

## 2014-11-25 DIAGNOSIS — E119 Type 2 diabetes mellitus without complications: Secondary | ICD-10-CM | POA: Diagnosis present

## 2014-11-25 HISTORY — PX: GASTRIC ROUX-EN-Y: SHX5262

## 2014-11-25 LAB — CBC
HCT: 41.8 % (ref 36.0–46.0)
Hemoglobin: 13.6 g/dL (ref 12.0–15.0)
MCH: 25.8 pg — ABNORMAL LOW (ref 26.0–34.0)
MCHC: 32.5 g/dL (ref 30.0–36.0)
MCV: 79.2 fL (ref 78.0–100.0)
Platelets: 267 10*3/uL (ref 150–400)
RBC: 5.28 MIL/uL — ABNORMAL HIGH (ref 3.87–5.11)
RDW: 17.7 % — ABNORMAL HIGH (ref 11.5–15.5)
WBC: 13.4 10*3/uL — ABNORMAL HIGH (ref 4.0–10.5)

## 2014-11-25 LAB — HEMOGLOBIN AND HEMATOCRIT, BLOOD
HCT: 46.3 % — ABNORMAL HIGH (ref 36.0–46.0)
Hemoglobin: 14.9 g/dL (ref 12.0–15.0)

## 2014-11-25 LAB — GLUCOSE, CAPILLARY
Glucose-Capillary: 109 mg/dL — ABNORMAL HIGH (ref 65–99)
Glucose-Capillary: 164 mg/dL — ABNORMAL HIGH (ref 65–99)
Glucose-Capillary: 152 mg/dL — ABNORMAL HIGH (ref 65–99)
Glucose-Capillary: 174 mg/dL — ABNORMAL HIGH (ref 65–99)

## 2014-11-25 LAB — MRSA PCR SCREENING: MRSA by PCR: NEGATIVE

## 2014-11-25 LAB — NO BLOOD PRODUCTS

## 2014-11-25 SURGERY — LAPAROSCOPIC ROUX-EN-Y GASTRIC BYPASS WITH UPPER ENDOSCOPY
Anesthesia: General

## 2014-11-25 MED ORDER — HYDROMORPHONE HCL 1 MG/ML IJ SOLN
0.2500 mg | INTRAMUSCULAR | Status: DC | PRN
  Administered 2014-11-25 (×4): 0.25 mg via INTRAVENOUS

## 2014-11-25 MED ORDER — ONDANSETRON HCL 4 MG/2ML IJ SOLN
INTRAMUSCULAR | Status: AC
  Filled 2014-11-25: qty 2

## 2014-11-25 MED ORDER — LACTATED RINGERS IV SOLN
INTRAVENOUS | Status: DC | PRN
  Administered 2014-11-25 (×2): via INTRAVENOUS

## 2014-11-25 MED ORDER — ONDANSETRON HCL 4 MG/2ML IJ SOLN
INTRAMUSCULAR | Status: DC | PRN
  Administered 2014-11-25: 4 mg via INTRAVENOUS

## 2014-11-25 MED ORDER — ACETAMINOPHEN 160 MG/5ML PO SOLN: 325.0000 mg | ORAL | Status: DC | PRN

## 2014-11-25 MED ORDER — MORPHINE SULFATE 2 MG/ML IJ SOLN
INTRAMUSCULAR | Status: AC
  Administered 2014-11-25: 2 mg via INTRAVENOUS
  Filled 2014-11-25: qty 1

## 2014-11-25 MED ORDER — ALBUTEROL SULFATE (2.5 MG/3ML) 0.083% IN NEBU: 3.0000 mL | INHALATION_SOLUTION | Freq: Four times a day (QID) | RESPIRATORY_TRACT | Status: DC | PRN

## 2014-11-25 MED ORDER — UNJURY CHOCOLATE CLASSIC POWDER
2.0000 [oz_av] | Freq: Four times a day (QID) | ORAL | Status: DC
  Administered 2014-11-27 – 2014-11-28 (×3): 2 [oz_av] via ORAL

## 2014-11-25 MED ORDER — TISSEEL VH 10 ML EX KIT
PACK | CUTANEOUS | Status: DC | PRN
  Administered 2014-11-25: 10 mL

## 2014-11-25 MED ORDER — HYDRALAZINE HCL 20 MG/ML IJ SOLN
2.5000 mg | INTRAMUSCULAR | Status: AC | PRN
  Administered 2014-11-25 (×2): 3 mg via INTRAVENOUS

## 2014-11-25 MED ORDER — CHLORHEXIDINE GLUCONATE CLOTH 2 % EX PADS: 6.0000 | MEDICATED_PAD | Freq: Once | CUTANEOUS | Status: DC

## 2014-11-25 MED ORDER — PROPOFOL 10 MG/ML IV BOLUS
INTRAVENOUS | Status: DC | PRN
  Administered 2014-11-25: 20 mg via INTRAVENOUS
  Administered 2014-11-25: 200 mg via INTRAVENOUS

## 2014-11-25 MED ORDER — DEXTROSE 5 % IV SOLN
2.0000 g | INTRAVENOUS | Status: AC
  Administered 2014-11-25 (×2): 2 g via INTRAVENOUS

## 2014-11-25 MED ORDER — 0.9 % SODIUM CHLORIDE (POUR BTL) OPTIME
TOPICAL | Status: DC | PRN
  Administered 2014-11-25: 1000 mL

## 2014-11-25 MED ORDER — ENALAPRILAT 1.25 MG/ML IV SOLN
1.2500 mg | Freq: Four times a day (QID) | INTRAVENOUS | Status: DC | PRN
  Filled 2014-11-25: qty 1

## 2014-11-25 MED ORDER — PANTOPRAZOLE SODIUM 40 MG IV SOLR
40.0000 mg | Freq: Every day | INTRAVENOUS | Status: DC
  Administered 2014-11-25 – 2014-11-27 (×3): 40 mg via INTRAVENOUS
  Filled 2014-11-25 (×4): qty 40

## 2014-11-25 MED ORDER — DEXAMETHASONE SODIUM PHOSPHATE 10 MG/ML IJ SOLN
INTRAMUSCULAR | Status: AC
  Filled 2014-11-25: qty 1

## 2014-11-25 MED ORDER — FUROSEMIDE 10 MG/ML IJ SOLN
10.0000 mg | INTRAMUSCULAR | Status: AC | PRN
  Administered 2014-11-25 (×2): 10 mg via INTRAVENOUS

## 2014-11-25 MED ORDER — DEXAMETHASONE SODIUM PHOSPHATE 10 MG/ML IJ SOLN
INTRAMUSCULAR | Status: DC | PRN
  Administered 2014-11-25: 10 mg via INTRAVENOUS

## 2014-11-25 MED ORDER — SODIUM CHLORIDE 0.9 % IJ SOLN
INTRAMUSCULAR | Status: DC | PRN
  Administered 2014-11-25: 10 mL

## 2014-11-25 MED ORDER — METOPROLOL TARTRATE 1 MG/ML IV SOLN
10.0000 mg | Freq: Four times a day (QID) | INTRAVENOUS | Status: DC | PRN
  Administered 2014-11-25 – 2014-11-27 (×2): 10 mg via INTRAVENOUS
  Filled 2014-11-25 (×4): qty 10

## 2014-11-25 MED ORDER — UNJURY CHICKEN SOUP POWDER
2.0000 [oz_av] | Freq: Four times a day (QID) | ORAL | Status: DC
  Administered 2014-11-27 (×2): 2 [oz_av] via ORAL

## 2014-11-25 MED ORDER — ACETAMINOPHEN 160 MG/5ML PO SOLN: 650.0000 mg | ORAL | Status: DC | PRN

## 2014-11-25 MED ORDER — ONDANSETRON HCL 4 MG/2ML IJ SOLN
4.0000 mg | INTRAMUSCULAR | Status: DC | PRN
  Administered 2014-11-27 (×2): 4 mg via INTRAVENOUS
  Filled 2014-11-25 (×3): qty 2

## 2014-11-25 MED ORDER — LEVALBUTEROL HCL 0.63 MG/3ML IN NEBU
INHALATION_SOLUTION | RESPIRATORY_TRACT | Status: AC
  Filled 2014-11-25: qty 3

## 2014-11-25 MED ORDER — HEPARIN SODIUM (PORCINE) 5000 UNIT/ML IJ SOLN
5000.0000 [IU] | Freq: Three times a day (TID) | INTRAMUSCULAR | Status: DC
  Administered 2014-11-25 – 2014-11-28 (×8): 5000 [IU] via SUBCUTANEOUS
  Filled 2014-11-25 (×11): qty 1

## 2014-11-25 MED ORDER — PROMETHAZINE HCL 25 MG/ML IJ SOLN: 6.2500 mg | INTRAMUSCULAR | Status: DC | PRN

## 2014-11-25 MED ORDER — LIDOCAINE HCL (CARDIAC) 20 MG/ML IV SOLN
INTRAVENOUS | Status: AC
  Filled 2014-11-25: qty 5

## 2014-11-25 MED ORDER — MIDAZOLAM HCL 2 MG/2ML IJ SOLN
INTRAMUSCULAR | Status: AC
  Filled 2014-11-25: qty 2

## 2014-11-25 MED ORDER — KCL IN DEXTROSE-NACL 20-5-0.45 MEQ/L-%-% IV SOLN
INTRAVENOUS | Status: DC
  Administered 2014-11-25 – 2014-11-26 (×2): via INTRAVENOUS
  Administered 2014-11-27: 100 mL/h via INTRAVENOUS
  Administered 2014-11-27: 20:00:00 via INTRAVENOUS
  Filled 2014-11-25 (×9): qty 1000

## 2014-11-25 MED ORDER — NEOSTIGMINE METHYLSULFATE 10 MG/10ML IV SOLN
INTRAVENOUS | Status: DC | PRN
Start: 2014-11-25 — End: 2014-11-25
  Administered 2014-11-25: 5 mg via INTRAVENOUS

## 2014-11-25 MED ORDER — MIDAZOLAM HCL 5 MG/5ML IJ SOLN
INTRAMUSCULAR | Status: DC | PRN
  Administered 2014-11-25: 2 mg via INTRAVENOUS

## 2014-11-25 MED ORDER — ALBUTEROL SULFATE (2.5 MG/3ML) 0.083% IN NEBU: 2.5000 mg | INHALATION_SOLUTION | Freq: Four times a day (QID) | RESPIRATORY_TRACT | Status: DC | PRN

## 2014-11-25 MED ORDER — SODIUM CHLORIDE 3 % IN NEBU
INHALATION_SOLUTION | RESPIRATORY_TRACT | Status: AC
  Filled 2014-11-25: qty 15

## 2014-11-25 MED ORDER — ROCURONIUM BROMIDE 100 MG/10ML IV SOLN
INTRAVENOUS | Status: AC
  Filled 2014-11-25: qty 1

## 2014-11-25 MED ORDER — FENTANYL CITRATE (PF) 100 MCG/2ML IJ SOLN
INTRAMUSCULAR | Status: DC | PRN
  Administered 2014-11-25 (×3): 50 ug via INTRAVENOUS
  Administered 2014-11-25 (×2): 100 ug via INTRAVENOUS
  Administered 2014-11-25: 50 ug via INTRAVENOUS
  Administered 2014-11-25: 100 ug via INTRAVENOUS

## 2014-11-25 MED ORDER — ESMOLOL HCL 10 MG/ML IV SOLN
INTRAVENOUS | Status: AC
  Filled 2014-11-25: qty 10

## 2014-11-25 MED ORDER — LEVALBUTEROL HCL 0.63 MG/3ML IN NEBU
0.6300 mg | INHALATION_SOLUTION | Freq: Three times a day (TID) | RESPIRATORY_TRACT | Status: DC
  Administered 2014-11-25: 0.63 mg via RESPIRATORY_TRACT

## 2014-11-25 MED ORDER — METOPROLOL TARTRATE 1 MG/ML IV SOLN
5.0000 mg | Freq: Four times a day (QID) | INTRAVENOUS | Status: DC | PRN
  Administered 2014-11-25: 5 mg via INTRAVENOUS
  Filled 2014-11-25: qty 5

## 2014-11-25 MED ORDER — DEXTROSE 5 % IV SOLN
INTRAVENOUS | Status: AC
  Filled 2014-11-25: qty 2

## 2014-11-25 MED ORDER — GLYCOPYRROLATE 0.2 MG/ML IJ SOLN
INTRAMUSCULAR | Status: DC | PRN
  Administered 2014-11-25: 0.6 mg via INTRAVENOUS

## 2014-11-25 MED ORDER — PROPOFOL 10 MG/ML IV BOLUS
INTRAVENOUS | Status: AC
  Filled 2014-11-25: qty 20

## 2014-11-25 MED ORDER — MORPHINE SULFATE 2 MG/ML IJ SOLN
2.0000 mg | INTRAMUSCULAR | Status: DC | PRN
  Administered 2014-11-25 (×2): 2 mg via INTRAVENOUS
  Filled 2014-11-25: qty 1

## 2014-11-25 MED ORDER — ROCURONIUM BROMIDE 100 MG/10ML IV SOLN
INTRAVENOUS | Status: DC | PRN
  Administered 2014-11-25: 50 mg via INTRAVENOUS
  Administered 2014-11-25 (×2): 10 mg via INTRAVENOUS

## 2014-11-25 MED ORDER — BUPIVACAINE LIPOSOME 1.3 % IJ SUSP
20.0000 mL | Freq: Once | INTRAMUSCULAR | Status: AC
  Administered 2014-11-25: 20 mL
  Filled 2014-11-25: qty 20

## 2014-11-25 MED ORDER — HYDROMORPHONE HCL 1 MG/ML IJ SOLN
INTRAMUSCULAR | Status: AC
  Filled 2014-11-25: qty 1

## 2014-11-25 MED ORDER — ESMOLOL HCL 10 MG/ML IV SOLN
INTRAVENOUS | Status: DC | PRN
  Administered 2014-11-25 (×4): 20 mg via INTRAVENOUS

## 2014-11-25 MED ORDER — HYDRALAZINE HCL 20 MG/ML IJ SOLN
INTRAMUSCULAR | Status: AC
  Administered 2014-11-25: 3 mg via INTRAVENOUS
  Filled 2014-11-25: qty 1

## 2014-11-25 MED ORDER — OXYCODONE HCL 5 MG/5ML PO SOLN
5.0000 mg | ORAL | Status: DC | PRN
  Administered 2014-11-26: 10 mg via ORAL
  Administered 2014-11-26: 5 mg via ORAL
  Administered 2014-11-27 – 2014-11-28 (×4): 10 mg via ORAL
  Filled 2014-11-25: qty 10
  Filled 2014-11-25: qty 5
  Filled 2014-11-25 (×4): qty 10
  Filled 2014-11-25: qty 50
  Filled 2014-11-25: qty 5

## 2014-11-25 MED ORDER — LACTATED RINGERS IR SOLN
Status: DC | PRN
  Administered 2014-11-25: 3000 mL

## 2014-11-25 MED ORDER — ALBUTEROL SULFATE (2.5 MG/3ML) 0.083% IN NEBU
INHALATION_SOLUTION | RESPIRATORY_TRACT | Status: AC
  Filled 2014-11-25: qty 3

## 2014-11-25 MED ORDER — LACTATED RINGERS IV SOLN
INTRAVENOUS | Status: DC | PRN
  Administered 2014-11-25 (×2): via INTRAVENOUS

## 2014-11-25 MED ORDER — SODIUM CHLORIDE 0.9 % IJ SOLN
INTRAMUSCULAR | Status: AC
  Filled 2014-11-25: qty 10

## 2014-11-25 MED ORDER — FENTANYL CITRATE (PF) 250 MCG/5ML IJ SOLN
INTRAMUSCULAR | Status: AC
  Filled 2014-11-25: qty 5

## 2014-11-25 MED ORDER — HYDRALAZINE HCL 20 MG/ML IJ SOLN
4.0000 mg | Freq: Once | INTRAMUSCULAR | Status: AC
Start: 2014-11-25 — End: 2014-11-25
  Administered 2014-11-25: 4 mg via INTRAVENOUS

## 2014-11-25 MED ORDER — TISSEEL VH 10 ML EX KIT
PACK | CUTANEOUS | Status: AC
  Filled 2014-11-25: qty 1

## 2014-11-25 MED ORDER — GLYCOPYRROLATE 0.2 MG/ML IJ SOLN
INTRAMUSCULAR | Status: AC
  Filled 2014-11-25: qty 3

## 2014-11-25 MED ORDER — INSULIN ASPART 100 UNIT/ML ~~LOC~~ SOLN
0.0000 [IU] | SUBCUTANEOUS | Status: DC
  Administered 2014-11-25 – 2014-11-26 (×3): 4 [IU] via SUBCUTANEOUS
  Administered 2014-11-26 – 2014-11-27 (×5): 3 [IU] via SUBCUTANEOUS

## 2014-11-25 MED ORDER — SUCCINYLCHOLINE CHLORIDE 20 MG/ML IJ SOLN
INTRAMUSCULAR | Status: DC | PRN
  Administered 2014-11-25: 100 mg via INTRAVENOUS
  Administered 2014-11-25: 20 mg via INTRAVENOUS
  Administered 2014-11-25: 100 mg via INTRAVENOUS

## 2014-11-25 MED ORDER — FUROSEMIDE 10 MG/ML IJ SOLN
INTRAMUSCULAR | Status: AC
  Administered 2014-11-25: 10 mg via INTRAVENOUS
  Filled 2014-11-25: qty 2

## 2014-11-25 MED ORDER — HYDROMORPHONE HCL 1 MG/ML IJ SOLN
1.0000 mg | INTRAMUSCULAR | Status: DC | PRN
  Administered 2014-11-25 (×3): 1 mg via INTRAVENOUS
  Administered 2014-11-26 – 2014-11-27 (×5): 2 mg via INTRAVENOUS
  Filled 2014-11-25 (×4): qty 2
  Filled 2014-11-25 (×2): qty 1
  Filled 2014-11-25 (×3): qty 2

## 2014-11-25 MED ORDER — LIDOCAINE HCL (CARDIAC) 20 MG/ML IV SOLN
INTRAVENOUS | Status: DC | PRN
  Administered 2014-11-25: 50 mg via INTRAVENOUS

## 2014-11-25 MED ORDER — LABETALOL HCL 5 MG/ML IV SOLN
INTRAVENOUS | Status: DC | PRN
  Administered 2014-11-25 (×6): 2.5 mg via INTRAVENOUS

## 2014-11-25 MED ORDER — LABETALOL HCL 5 MG/ML IV SOLN
INTRAVENOUS | Status: AC
  Filled 2014-11-25: qty 4

## 2014-11-25 MED ORDER — HEPARIN SODIUM (PORCINE) 5000 UNIT/ML IJ SOLN
5000.0000 [IU] | INTRAMUSCULAR | Status: AC
  Administered 2014-11-25: 5000 [IU] via SUBCUTANEOUS
  Filled 2014-11-25: qty 1

## 2014-11-25 MED ORDER — UNJURY VANILLA POWDER: 2.0000 [oz_av] | Freq: Four times a day (QID) | ORAL | Status: DC

## 2014-11-25 MED ORDER — NEOSTIGMINE METHYLSULFATE 10 MG/10ML IV SOLN
INTRAVENOUS | Status: AC
  Filled 2014-11-25: qty 1

## 2014-11-25 SURGICAL SUPPLY — 70 items
APL SKNCLS STERI-STRIP NONHPOA (GAUZE/BANDAGES/DRESSINGS)
APL SRG 32X5 SNPLK LF DISP (MISCELLANEOUS) ×1
APPLICATOR COTTON TIP 6IN STRL (MISCELLANEOUS) ×6 IMPLANT
BENZOIN TINCTURE PRP APPL 2/3 (GAUZE/BANDAGES/DRESSINGS) IMPLANT
BLADE SURG 15 STRL LF DISP TIS (BLADE) ×1 IMPLANT
BLADE SURG 15 STRL SS (BLADE) ×3
CABLE HIGH FREQUENCY MONO STRZ (ELECTRODE) IMPLANT
CLIP SUT LAPRA TY ABSORB (SUTURE) ×7 IMPLANT
CLOSURE WOUND 1/2 X4 (GAUZE/BANDAGES/DRESSINGS)
DEVICE SUT QUICK LOAD TK 5 (STAPLE) IMPLANT
DEVICE SUT TI-KNOT TK 5X26 (MISCELLANEOUS) IMPLANT
DEVICE SUTURE ENDOST 10MM (ENDOMECHANICALS) ×3 IMPLANT
DEVICE TI KNOT TK5 (MISCELLANEOUS)
DISSECTOR BLUNT TIP ENDO 5MM (MISCELLANEOUS) IMPLANT
DRAIN PENROSE 18X1/4 LTX STRL (WOUND CARE) ×3 IMPLANT
DRAPE CAMERA CLOSED 9X96 (DRAPES) ×3 IMPLANT
GAUZE SPONGE 4X4 12PLY STRL (GAUZE/BANDAGES/DRESSINGS) ×3 IMPLANT
GAUZE SPONGE 4X4 16PLY XRAY LF (GAUZE/BANDAGES/DRESSINGS) ×3 IMPLANT
GLOVE BIOGEL M 8.0 STRL (GLOVE) ×3 IMPLANT
GOWN STRL REUS W/TWL XL LVL3 (GOWN DISPOSABLE) ×12 IMPLANT
HANDLE STAPLE EGIA 4 XL (STAPLE) ×3 IMPLANT
HOVERMATT SINGLE USE (MISCELLANEOUS) ×3 IMPLANT
KIT BASIN OR (CUSTOM PROCEDURE TRAY) ×3 IMPLANT
KIT GASTRIC LAVAGE 34FR ADT (SET/KITS/TRAYS/PACK) ×3 IMPLANT
NDL SPNL 22GX3.5 QUINCKE BK (NEEDLE) ×1 IMPLANT
NEEDLE SPNL 22GX3.5 QUINCKE BK (NEEDLE) ×3 IMPLANT
PACK CARDIOVASCULAR III (CUSTOM PROCEDURE TRAY) ×3 IMPLANT
PEN SKIN MARKING BROAD (MISCELLANEOUS) ×3 IMPLANT
QUICK LOAD TK 5 (STAPLE)
RELOAD EGIA 45 MED/THCK PURPLE (STAPLE) ×8 IMPLANT
RELOAD EGIA 45 TAN VASC (STAPLE) ×6 IMPLANT
RELOAD EGIA 60 MED/THCK PURPLE (STAPLE) ×6 IMPLANT
RELOAD EGIA 60 TAN VASC (STAPLE) ×6 IMPLANT
RELOAD ENDO STITCH 2.0 (ENDOMECHANICALS) ×27
RELOAD STAPLE 45 PURP MED/THCK (STAPLE) IMPLANT
RELOAD STAPLE 60 MED/THCK ART (STAPLE) ×2 IMPLANT
RELOAD SUT SNGL STCH ABSRB 2-0 (ENDOMECHANICALS) ×5 IMPLANT
RELOAD SUT SNGL STCH BLK 2-0 (ENDOMECHANICALS) ×4 IMPLANT
RELOAD TRI 45 ART MED THCK PUR (STAPLE) IMPLANT
RELOAD TRI 60 ART MED THCK PUR (STAPLE) ×4 IMPLANT
SCISSORS LAP 5X45 EPIX DISP (ENDOMECHANICALS) ×3 IMPLANT
SEALANT SURGICAL APPL DUAL CAN (MISCELLANEOUS) ×3 IMPLANT
SET IRRIG TUBING LAPAROSCOPIC (IRRIGATION / IRRIGATOR) ×3 IMPLANT
SHEARS CURVED HARMONIC AC 45CM (MISCELLANEOUS) ×3 IMPLANT
SLEEVE ADV FIXATION 12X100MM (TROCAR) ×6 IMPLANT
SLEEVE ADV FIXATION 5X100MM (TROCAR) IMPLANT
SOLUTION ANTI FOG 6CC (MISCELLANEOUS) ×3 IMPLANT
STAPLER VISISTAT 35W (STAPLE) ×3 IMPLANT
STRIP CLOSURE SKIN 1/2X4 (GAUZE/BANDAGES/DRESSINGS) IMPLANT
SUT RELOAD ENDO STITCH 2 48X1 (ENDOMECHANICALS) ×5
SUT RELOAD ENDO STITCH 2.0 (ENDOMECHANICALS) ×4
SUT SURGIDAC NAB ES-9 0 48 120 (SUTURE) IMPLANT
SUT VIC AB 2-0 SH 27 (SUTURE) ×3
SUT VIC AB 2-0 SH 27X BRD (SUTURE) ×1 IMPLANT
SUT VIC AB 4-0 SH 18 (SUTURE) ×3 IMPLANT
SUTURE RELOAD END STTCH 2 48X1 (ENDOMECHANICALS) ×5 IMPLANT
SUTURE RELOAD ENDO STITCH 2.0 (ENDOMECHANICALS) ×4 IMPLANT
SYR 20CC LL (SYRINGE) ×6 IMPLANT
SYR 50ML LL SCALE MARK (SYRINGE) ×3 IMPLANT
TOWEL OR 17X26 10 PK STRL BLUE (TOWEL DISPOSABLE) ×6 IMPLANT
TOWEL OR NON WOVEN STRL DISP B (DISPOSABLE) ×3 IMPLANT
TRAY FOLEY W/METER SILVER 14FR (SET/KITS/TRAYS/PACK) ×3 IMPLANT
TROCAR ADV FIXATION 12X100MM (TROCAR) ×3 IMPLANT
TROCAR ADV FIXATION 5X100MM (TROCAR) ×3 IMPLANT
TROCAR BLADELESS OPT 5 100 (ENDOMECHANICALS) ×3 IMPLANT
TROCAR XCEL 12X100 BLDLESS (ENDOMECHANICALS) ×3 IMPLANT
TUBING CONNECTING 10 (TUBING) ×2 IMPLANT
TUBING CONNECTING 10' (TUBING) ×1
TUBING ENDO SMARTCAP PENTAX (MISCELLANEOUS) ×3 IMPLANT
TUBING FILTER THERMOFLATOR (ELECTROSURGICAL) ×3 IMPLANT

## 2014-11-25 NOTE — Anesthesia Postprocedure Evaluation (Signed)
  Anesthesia Post-op Note  Patient: Caroline Williams  Procedure(s) Performed: Procedure(s): LAPAROSCOPIC ROUX-EN-Y GASTRIC BYPASS WITH UPPER ENDOSCOPY (N/A)  Patient Location: PACU  Anesthesia Type:General  Level of Consciousness: awake  Airway and Oxygen Therapy: Patient Spontanous Breathing  Post-op Pain: mild  Post-op Assessment: Post-op Vital signs reviewed              Post-op Vital Signs: Reviewed  Last Vitals:  Filed Vitals:   11/25/14 1720  BP: 195/90  Pulse: 92  Temp:   Resp: 19    Complications: No apparent anesthesia complications

## 2014-11-25 NOTE — Anesthesia Procedure Notes (Signed)
Procedure Name: Intubation Date/Time: 11/25/2014 7:55 AM Performed by: Dimas Millin, Roselle Norton F Pre-anesthesia Checklist: Patient identified, Emergency Drugs available, Suction available, Patient being monitored and Timeout performed Patient Re-evaluated:Patient Re-evaluated prior to inductionOxygen Delivery Method: Circle system utilized Preoxygenation: Pre-oxygenation with 100% oxygen Intubation Type: IV induction Ventilation: Mask ventilation without difficulty Laryngoscope Size: Glidescope and 4 Grade View: Grade I Tube type: Oral Tube size: 7.0 mm Number of attempts: 2 Airway Equipment and Method: Patient positioned with wedge pillow and Stylet

## 2014-11-25 NOTE — Transfer of Care (Signed)
Immediate Anesthesia Transfer of Care Note  Patient: Caroline Williams  Procedure(s) Performed: Procedure(s): LAPAROSCOPIC ROUX-EN-Y GASTRIC BYPASS WITH UPPER ENDOSCOPY (N/A)  Patient Location: PACU  Anesthesia Type:General  Level of Consciousness:  sedated, patient cooperative and responds to stimulation  Airway & Oxygen Therapy:Patient Spontanous Breathing and Patient connected to face mask oxgen  Post-op Assessment:  Report given to PACU RN and Post -op Vital signs reviewed and stable  Post vital signs:  Reviewed and stable  Last Vitals:  Filed Vitals:   11/25/14 0529  BP: 145/87  Pulse: 88  Temp: 36.5 C  Resp: 16    Complications: No apparent anesthesia complications

## 2014-11-25 NOTE — Op Note (Signed)
Name:  Caroline Williams MRN: [id number] Date of Surgery: 11/25/2014  Preop Diagnosis:  Morbid Obesity, S/P RYGB  Postop Diagnosis:  Morbid Obesity (Weight - 306, BMI - 50.96), S/P RYGB  Procedure:  Upper endoscopy  (Intraoperative)  Surgeon:  Alphonsa Overall, M.D.  Anesthesia:  GET  Indications for procedure: Caroline Williams is a 54 y.o. female whose primary care physician is FULP, CAMMIE, MD and has completed a Roux-en-Y gastric bypass today by Dr. Hassell Done.  I am doing an intraoperative upper endoscopy to evaluate the gastric pouch and the gastro-jejunal anastomosis.  Operative Note: The patient is under general anesthesia.  Dr. Hassell Done is laparoscoping the patient while I do an upper endoscopy to evaluate the stomach pouch and gastrojejunal anastomosis.  With the patient intubated, I passed the Pentax endoscope without difficulty down the esophagus.  The esophago-gastric junction was at 39 cm.  The gastro-jejunal anastomosis was at 44 cm.  The mucosa of the stomach looked viable and the staple line was intact without bleeding.  The gastro-jejunal anastomosis looked okay.  While I insufflated the stomach pouch with air, Dr. Hassell Done clamped off the efferent limb of the jejunum.  He then flooded the upper abdomen with saline to put the gastric pouch and gastro-jejunal anastomosis under saline.  There was no bubbling or evidence of a leak.  Photos were taken of the anastomosis.  The scope was then withdrawn.  The esophagus was unremarkable and the patient tolerated the endoscopy without difficulty.  Alphonsa Overall, MD, [facility] Pager: [phone] Office phone:  [phone]

## 2014-11-25 NOTE — Progress Notes (Signed)
Performed Arterial Blood draw from Right Radial artery for labs per Lab request & MD order. Pt tolerated well.

## 2014-11-25 NOTE — Progress Notes (Signed)
Placed pt on CPAP. Auto-titrate, MIN..5, MAX.Marland Kitchen15 with 3L bled in via FFM per home regimen. Tolerating well with no complications.

## 2014-11-25 NOTE — Anesthesia Preprocedure Evaluation (Addendum)
Anesthesia Evaluation  Patient identified by MRN, date of birth, ID band Patient awake    Reviewed: Allergy & Precautions, NPO status , Patient's Chart, lab work & pertinent test results  Airway Mallampati: II  TM Distance: >3 FB Neck ROM: Full    Dental   Pulmonary asthma , sleep apnea , former smoker,  breath sounds clear to auscultation        Cardiovascular hypertension, Rhythm:Regular Rate:Normal     Neuro/Psych Anxiety Depression Bipolar Disorder    GI/Hepatic GERD-  ,  Endo/Other  diabetesHypothyroidism Hyperthyroidism   Renal/GU      Musculoskeletal   Abdominal   Peds  Hematology   Anesthesia Other Findings   Reproductive/Obstetrics                            Anesthesia Physical Anesthesia Plan  ASA: III  Anesthesia Plan: General   Post-op Pain Management:    Induction: Intravenous  Airway Management Planned: Oral ETT  Additional Equipment:   Intra-op Plan:   Post-operative Plan: Possible Post-op intubation/ventilation  Informed Consent: I have reviewed the patients History and Physical, chart, labs and discussed the procedure including the risks, benefits and alternatives for the proposed anesthesia with the patient or authorized representative who has indicated his/her understanding and acceptance.   Dental advisory given  Plan Discussed with: CRNA and Anesthesiologist  Anesthesia Plan Comments:         Anesthesia Quick Evaluation

## 2014-11-25 NOTE — Interval H&P Note (Signed)
History and Physical Interval Note:  11/25/2014 7:06 AM  Caroline Williams  has presented today for surgery, with the diagnosis of MORBID OBESITY  The various methods of treatment have been discussed with the patient and family. After consideration of risks, benefits and other options for treatment, the patient has consented to  Procedure(s): LAPAROSCOPIC ROUX-EN-Y GASTRIC BYPASS WITH UPPER ENDOSCOPY (N/A) as a surgical intervention .  The patient's history has been reviewed, patient examined, no change in status, stable for surgery.  I have reviewed the patient's chart and labs.  Questions were answered to the patient's satisfaction.     [name]

## 2014-11-25 NOTE — Op Note (Signed)
Surgeon: Althea Grimmer. Hassell Done, MD, FACS Asst:  Alphonsa Overall, MD, FACS Anesthesia: General endotracheal Drains: None  Procedure: Laparoscopic Roux en Y gastric bypass with 40 cm BP limb and 100 cm Roux limb, antecolic, antegastric, candy cane to the left.  Closure of Peterson's defect. Upper endoscopy.   Description of Procedure:  The patient was taken to OR 1 at [facility] and given general anesthesia.  The abdomen was prepped with PCMX and draped sterilely.  A time out was performed.    The operation began by identifying the ligament of Treitz. I measured 40 cm downstream and divided the bowel with a 6 cm Covidian stapler.  I sutured a Penrose drain along the Roux limb end.  I measured a 1 meter (100 cm) Roux limb and then placed the distal bowels to the BP limb side by side and performed a stapled jejunojejunostomy. The common defect was closed from either end with 4-0 Vicryl using the Endo Stitch. The mesenteric defect was closed with a running 2-0 silk using the Endo Stitch. Tisseel was applied to the suture line.  The omentum was divided with the harmonic scalpel.  The Nathanson retractor was inserted in the left lateral segment of liver was retracted. The foregut dissection ensued.  A 5 cm pouch was created first measuring down along the lessor curvature and then dissecting into the lessor sac.  The pouch was created using multiple firings of the Coviden tristaple with the first two purple cartridges plain and the remaining with TRS.    The Roux limb was then brought up with the candycane pointed left and a back row of sutures of 2-0 Vicryl were placed. I opened along the right side of each structure and inserted the 4.5 cm stapler to create the gastrojejunostomy. The common defect was closed from either end with 2-0 Vicryl and a second row was placed anterior to that the Ewald tube acting as a stent across the anastomosis. The Penrose drain was removed. Peterson's defect was closed with 2-0 silk.    Endoscopy was performed by Dr. Lucia Gaskins.    The incisions were injected with Exparel and were closed with 4-0 Vicryl and Liquiban  The patient was taken to the recovery room in satisfactory condition.  Matt B. Hassell Done, MD, FACS

## 2014-11-26 ENCOUNTER — Inpatient Hospital Stay (HOSPITAL_COMMUNITY): Payer: Medicare Other

## 2014-11-26 ENCOUNTER — Encounter (HOSPITAL_COMMUNITY): Payer: Self-pay | Admitting: Surgery

## 2014-11-26 LAB — HEMOGLOBIN AND HEMATOCRIT, BLOOD
HCT: 37.6 % (ref 36.0–46.0)
Hemoglobin: 11.9 g/dL — ABNORMAL LOW (ref 12.0–15.0)

## 2014-11-26 LAB — CBC WITH DIFFERENTIAL/PLATELET
Basophils Absolute: 0 10*3/uL (ref 0.0–0.1)
Basophils Relative: 0 % (ref 0–1)
Eosinophils Absolute: 0 10*3/uL (ref 0.0–0.7)
Eosinophils Relative: 0 % (ref 0–5)
HCT: 38.7 % (ref 36.0–46.0)
Hemoglobin: 12.2 g/dL (ref 12.0–15.0)
Lymphocytes Relative: 10 % — ABNORMAL LOW (ref 12–46)
Lymphs Abs: 1.1 10*3/uL (ref 0.7–4.0)
MCH: 25.2 pg — ABNORMAL LOW (ref 26.0–34.0)
MCHC: 31.5 g/dL (ref 30.0–36.0)
MCV: 79.8 fL (ref 78.0–100.0)
Monocytes Absolute: 0.7 10*3/uL (ref 0.1–1.0)
Monocytes Relative: 6 % (ref 3–12)
Neutro Abs: 9.1 10*3/uL — ABNORMAL HIGH (ref 1.7–7.7)
Neutrophils Relative %: 84 % — ABNORMAL HIGH (ref 43–77)
Platelets: 286 10*3/uL (ref 150–400)
RBC: 4.85 MIL/uL (ref 3.87–5.11)
RDW: 18 % — ABNORMAL HIGH (ref 11.5–15.5)
WBC: 10.9 10*3/uL — ABNORMAL HIGH (ref 4.0–10.5)

## 2014-11-26 LAB — GLUCOSE, CAPILLARY
Glucose-Capillary: 160 mg/dL — ABNORMAL HIGH (ref 65–99)
Glucose-Capillary: 143 mg/dL — ABNORMAL HIGH (ref 65–99)
Glucose-Capillary: 109 mg/dL — ABNORMAL HIGH (ref 65–99)
Glucose-Capillary: 110 mg/dL — ABNORMAL HIGH (ref 65–99)
Glucose-Capillary: 111 mg/dL — ABNORMAL HIGH (ref 65–99)

## 2014-11-26 MED ORDER — IOHEXOL 300 MG/ML  SOLN
50.0000 mL | Freq: Once | INTRAMUSCULAR | Status: AC | PRN
  Administered 2014-11-26: 50 mL via ORAL

## 2014-11-26 MED ORDER — FLUTICASONE PROPIONATE 50 MCG/ACT NA SUSP
2.0000 | Freq: Every day | NASAL | Status: DC
  Administered 2014-11-26 – 2014-11-28 (×3): 2 via NASAL
  Filled 2014-11-26 (×2): qty 16

## 2014-11-26 MED ORDER — ONDANSETRON HCL 4 MG PO TABS
4.0000 mg | ORAL_TABLET | Freq: Once | ORAL | Status: AC
  Administered 2014-11-26: 4 mg via ORAL
  Filled 2014-11-26: qty 1

## 2014-11-26 NOTE — Progress Notes (Signed)
Patient ID: Caroline Williams, female   DOB: 21-Jun-1960, 54 y.o.   MRN: [id number] [facility] Progress Note:   1 Day Post-Op  Subjective: Mental status is alert and clear Objective: Vital signs in last 24 hours: Temp:  [98.1 F (36.7 C)-100.2 F (37.9 C)] 98.3 F (36.8 C) (06/14 0800) Pulse Rate:  [57-105] 79 (06/14 0821) Resp:  [10-25] 17 (06/14 0600) BP: (128-220)/(10-126) 136/90 mmHg (06/14 0821) SpO2:  [91 %-100 %] 94 % (06/14 0821)  Intake/Output from previous day: 06/13 0701 - 06/14 0700 In: 3993.3 [I.V.:3863.3; IV Piggyback:100] Out: 2605 [Urine:2580; Blood:25] Intake/Output this shift: Total I/O In: 400 [I.V.:400] Out: -   Physical Exam: Work of breathing is normal.  No complaints.    Lab Results:  Results for orders placed or performed during the hospital encounter of 11/25/14 (from the past 48 hour(s))  Glucose, capillary     Status: Abnormal   Collection Time: 11/25/14  5:36 AM  Result Value Ref Range   Glucose-Capillary 109 (H) 65 - 99 mg/dL   Comment 1 Notify RN   No blood products     Status: None   Collection Time: 11/25/14  6:30 AM  Result Value Ref Range   Transfuse no blood products      TRANSFUSE NO BLOOD PRODUCTS, VERIFIED BY PAM GORDON,RN  Glucose, capillary     Status: Abnormal   Collection Time: 11/25/14 12:26 PM  Result Value Ref Range   Glucose-Capillary 164 (H) 65 - 99 mg/dL  Hemoglobin and hematocrit, blood     Status: Abnormal   Collection Time: 11/25/14  1:25 PM  Result Value Ref Range   Hemoglobin 14.9 12.0 - 15.0 g/dL   HCT 46.3 (H) 36.0 - 46.0 %  Glucose, capillary     Status: Abnormal   Collection Time: 11/25/14  5:41 PM  Result Value Ref Range   Glucose-Capillary 152 (H) 65 - 99 mg/dL   Comment 1 Notify RN    Comment 2 Document in Chart   CBC     Status: Abnormal   Collection Time: 11/25/14  6:30 PM  Result Value Ref Range   WBC 13.4 (H) 4.0 - 10.5 K/uL   RBC 5.28 (H) 3.87 - 5.11 MIL/uL   Hemoglobin 13.6 12.0 - 15.0  g/dL   HCT 41.8 36.0 - 46.0 %   MCV 79.2 78.0 - 100.0 fL   MCH 25.8 (L) 26.0 - 34.0 pg   MCHC 32.5 30.0 - 36.0 g/dL   RDW 17.7 (H) 11.5 - 15.5 %   Platelets 267 150 - 400 K/uL  Glucose, capillary     Status: Abnormal   Collection Time: 11/25/14  7:12 PM  Result Value Ref Range   Glucose-Capillary 174 (H) 65 - 99 mg/dL  MRSA PCR Screening     Status: None   Collection Time: 11/25/14  8:47 PM  Result Value Ref Range   MRSA by PCR NEGATIVE NEGATIVE    Comment:        The GeneXpert MRSA Assay (FDA approved for NASAL specimens only), is one component of a comprehensive MRSA colonization surveillance program. It is not intended to diagnose MRSA infection nor to guide or monitor treatment for MRSA infections.   Glucose, capillary     Status: Abnormal   Collection Time: 11/26/14 12:25 AM  Result Value Ref Range   Glucose-Capillary 160 (H) 65 - 99 mg/dL  CBC WITH DIFFERENTIAL     Status: Abnormal   Collection Time: 11/26/14  3:45  AM  Result Value Ref Range   WBC 10.9 (H) 4.0 - 10.5 K/uL   RBC 4.85 3.87 - 5.11 MIL/uL   Hemoglobin 12.2 12.0 - 15.0 g/dL   HCT 38.7 36.0 - 46.0 %   MCV 79.8 78.0 - 100.0 fL   MCH 25.2 (L) 26.0 - 34.0 pg   MCHC 31.5 30.0 - 36.0 g/dL   RDW 18.0 (H) 11.5 - 15.5 %   Platelets 286 150 - 400 K/uL   Neutrophils Relative % 84 (H) 43 - 77 %   Neutro Abs 9.1 (H) 1.7 - 7.7 K/uL   Lymphocytes Relative 10 (L) 12 - 46 %   Lymphs Abs 1.1 0.7 - 4.0 K/uL   Monocytes Relative 6 3 - 12 %   Monocytes Absolute 0.7 0.1 - 1.0 K/uL   Eosinophils Relative 0 0 - 5 %   Eosinophils Absolute 0.0 0.0 - 0.7 K/uL   Basophils Relative 0 0 - 1 %   Basophils Absolute 0.0 0.0 - 0.1 K/uL  Glucose, capillary     Status: Abnormal   Collection Time: 11/26/14  5:11 AM  Result Value Ref Range   Glucose-Capillary 143 (H) 65 - 99 mg/dL    Radiology/Results: Dg Chest Portable 1 View  11/25/2014   CLINICAL DATA:  Aspiration versus pulmonary edema. Post intubation/preop.  EXAM:  PORTABLE CHEST - 1 VIEW  COMPARISON:  07/19/2014  FINDINGS: The endotracheal tube tip partly overlaps the OG tube, but appears near the clavicular heads. The orogastric tube side port is 9 cm above the GE junction, tip near the junction.  Cardiopericardial enlargement, accentuated by hypoventilation and portable technique.  There is cephalized blood flow, likely from supine state. There is no overt negative pressure pulmonary edema. Bibasilar atelectasis, most notable in the retrocardiac lung where it is likely segmental. Case discussed with Dr. Oletta Lamas via telephone. Due to secretions, the main concern was for white out from lung collapse or definite large volume aspiration -both absent.  IMPRESSION: 1. Low volume chest with atelectasis, greatest in the left lower lobe. 2. Orogastric tube tip is at the GE junction.   Electronically Signed   By: Monte Fantasia M.D.   On: 11/25/2014 08:31   Dg Duanne Limerick W/water Sol Cm  11/26/2014   CLINICAL DATA:  Gastric bypass, postop day 1  EXAM: WATER SOLUBLE UPPER GI SERIES  TECHNIQUE: Single-column upper GI series was performed using water soluble contrast.  CONTRAST:  50 cc Omnipaque 300  COMPARISON:  08/20/2014  FLUOROSCOPY TIME:  Radiation Exposure Index (as provided by the fluoroscopic device):  If the device does not provide the exposure index:  Fluoroscopy Time (in minutes and seconds):  1 minutes, 59 seconds  Number of Acquired Images:  8  FINDINGS: Contrast filled the gastric pouch but with slow to empty through the gastrojejunostomy. This slow emptying caused initially indistinct appearance of the jejunum which improved using angulated views and delayed imaging.  No leak or complicating feature of gastric bypass is identified. Jejunal loops distal to the gastrojejunostomy patent and unremarkable.  IMPRESSION: 1. No leak or complicating feature related to gastrojejunostomy identified.   Electronically Signed   By: Van Clines M.D.   On: 11/26/2014 10:03     Anti-infectives: Anti-infectives    Start     Dose/Rate Route Frequency Ordered Stop   11/25/14 0606  cefOXitin (MEFOXIN) 2 g in dextrose 5 % 50 mL IVPB     2 g 100 mL/hr over 30 Minutes Intravenous On call  to O.R. 11/25/14 0606 11/25/14 1000      Assessment/Plan: Problem List: Patient Active Problem List   Diagnosis Date Noted  . S/P gastric bypass 11/25/2014  . Osteoarthritis of right knee 09/05/2013  . Diabetes mellitus with neuropathy 06/20/2013  . Chronic pain of right knee 06/20/2013  . Morbid obesity 07/05/2012    PD 1 swallow OK.  Will advance to PD 1 bariatric diet.  Transfer to [place]    LOS: 1 day   Matt B. Hassell Done, MD, [facility] [phone] beeper [phone]  11/26/2014 10:45 AM

## 2014-11-26 NOTE — Plan of Care (Signed)
Problem: Phase II Progression Outcomes Goal: Progress activity as tolerated unless otherwise ordered Outcome: Completed/Met Date Met:  11/26/14 Walked in room with only walker assistance VSS throughout. Pt stating no pain, just soreness while ambulating.

## 2014-11-26 NOTE — Plan of Care (Signed)
Problem: Food- and Nutrition-Related Knowledge Deficit (NB-1.1) Goal: Nutrition education Formal process to instruct or train a patient/client in a skill or to impart knowledge to help patients/clients voluntarily manage or modify food choices and eating behavior to maintain or improve health. Outcome: Completed/Met Date Met:  11/26/14 Nutrition Education Note  Received consult for diet education per DROP protocol.   Discussed 2 week post op diet with pt. Emphasized that liquids must be non carbonated, non caffeinated, and sugar free. Fluid goals discussed. Pt to follow up with outpatient bariatric RD for further diet progression after 2 weeks. Multivitamins and minerals also reviewed. Teach back method used, pt expressed understanding, expect good compliance.  Diet: First 2 Weeks  You will see the nutritionist about two (2) weeks after your surgery. The nutritionist will increase the types of foods you can eat if you are handling liquids well:  If you have severe vomiting or nausea and cannot handle clear liquids lasting longer than 1 day, call your surgeon  Protein Shake  Drink at least 2 ounces of shake 5-6 times per day  Each serving of protein shakes (usually 8 - 12 ounces) should have a minimum of:  15 grams of protein  And no more than 5 grams of carbohydrate  Goal for protein each day:  Men = 80 grams per day  Women = 60 grams per day  Protein powder may be added to fluids such as non-fat milk or Lactaid milk or Soy milk (limit to 35 grams added protein powder per serving)   Hydration  Slowly increase the amount of water and other clear liquids as tolerated (See Acceptable Fluids)  Slowly increase the amount of protein shake as tolerated  Sip fluids slowly and throughout the day  May use sugar substitutes in small amounts (no more than 6 - 8 packets per day; i.e. Splenda)   Fluid Goal  The first goal is to drink at least 8 ounces of protein shake/drink per day (or as directed by  the nutritionist); some examples of protein shakes are [employer], [employer], EAS Edge HP, and Unjury. See handout from pre-op Bariatric Education Class:  Slowly increase the amount of protein shake you drink as tolerated  You may find it easier to slowly sip shakes throughout the day  It is important to get your proteins in first  Your fluid goal is to drink 64 - 100 ounces of fluid daily  It may take a few weeks to build up to this  32 oz (or more) should be clear liquids  And  32 oz (or more) should be full liquids (see below for examples)  Liquids should not contain sugar, caffeine, or carbonation   Clear Liquids:  Water or Sugar-free flavored water (i.e. Fruit H2O, Propel)  Decaffeinated coffee or tea (sugar-free)  Crystal Lite, Wyler's Lite, Minute Maid Lite  Sugar-free Jell-O  Bouillon or broth  Sugar-free Popsicle: *Less than 20 calories each; Limit 1 per day   Full Liquids:  Protein Shakes/Drinks + 2 choices per day of other full liquids  Full liquids must be:  No More Than 12 grams of Carbs per serving  No More Than 3 grams of Fat per serving  Strained low-fat cream soup  Non-Fat milk  Fat-free Lactaid Milk  Sugar-free yogurt (Dannon Lite & Fit, [place] yogurt)   [place], [place], [place]

## 2014-11-26 NOTE — Progress Notes (Signed)
Date:  November 26, 2014 U.R. performed for needs and level of care. Will continue to follow for Case Management needs.  Velva Harman, RN, BSN, [place]   [phone]

## 2014-11-26 NOTE — Care Management Note (Signed)
Case Management Note  Patient Details  Name: Caroline Williams MRN: [id number] Date of Birth: [birth year]  Subjective/Objective:                 Gastric bypass and risk risk with cardiac involovement   Action/Plan: home   Expected Discharge Date:       [id number]           Expected Discharge Plan:  Home/Self Care  In-House Referral:  NA  Discharge planning Services  CM Consult  Post Acute Care Choice:  NA Choice offered to:  NA  DME Arranged:  N/A DME Agency:  NA  HH Arranged:  NA HH Agency:  NA  Status of Service:  In process, will continue to follow  Medicare Important Message Given:    Date Medicare IM Given:    Medicare IM give by:    Date Additional Medicare IM Given:    Additional Medicare Important Message give by:     If discussed at [place] of Stay Meetings, dates discussed:    Additional Comments:  Leeroy Cha, RN 11/26/2014, 1:36 PM

## 2014-11-27 LAB — HEMOGLOBIN A1C
Hgb A1c MFr Bld: 6.9 % — ABNORMAL HIGH (ref 4.8–5.6)
Mean Plasma Glucose: 151 mg/dL

## 2014-11-27 LAB — CBC WITH DIFFERENTIAL/PLATELET
Basophils Absolute: 0 10*3/uL (ref 0.0–0.1)
Basophils Relative: 0 % (ref 0–1)
Eosinophils Absolute: 0 10*3/uL (ref 0.0–0.7)
Eosinophils Relative: 0 % (ref 0–5)
HCT: 39.3 % (ref 36.0–46.0)
Hemoglobin: 12.6 g/dL (ref 12.0–15.0)
Lymphocytes Relative: 16 % (ref 12–46)
Lymphs Abs: 1.4 10*3/uL (ref 0.7–4.0)
MCH: 25.3 pg — ABNORMAL LOW (ref 26.0–34.0)
MCHC: 32.1 g/dL (ref 30.0–36.0)
MCV: 78.8 fL (ref 78.0–100.0)
Monocytes Absolute: 0.5 10*3/uL (ref 0.1–1.0)
Monocytes Relative: 6 % (ref 3–12)
Neutro Abs: 7 10*3/uL (ref 1.7–7.7)
Neutrophils Relative %: 78 % — ABNORMAL HIGH (ref 43–77)
Platelets: 258 10*3/uL (ref 150–400)
RBC: 4.99 MIL/uL (ref 3.87–5.11)
RDW: 18 % — ABNORMAL HIGH (ref 11.5–15.5)
WBC: 9 10*3/uL (ref 4.0–10.5)

## 2014-11-27 LAB — GLUCOSE, CAPILLARY
Glucose-Capillary: 112 mg/dL — ABNORMAL HIGH (ref 65–99)
Glucose-Capillary: 116 mg/dL — ABNORMAL HIGH (ref 65–99)
Glucose-Capillary: 118 mg/dL — ABNORMAL HIGH (ref 65–99)
Glucose-Capillary: 131 mg/dL — ABNORMAL HIGH (ref 65–99)
Glucose-Capillary: 131 mg/dL — ABNORMAL HIGH (ref 65–99)
Glucose-Capillary: 134 mg/dL — ABNORMAL HIGH (ref 65–99)
Glucose-Capillary: 145 mg/dL — ABNORMAL HIGH (ref 65–99)

## 2014-11-27 MED ORDER — LOSARTAN POTASSIUM 50 MG PO TABS
100.0000 mg | ORAL_TABLET | Freq: Every morning | ORAL | Status: DC
  Administered 2014-11-27 – 2014-11-28 (×2): 100 mg via ORAL
  Filled 2014-11-27 (×2): qty 2

## 2014-11-27 MED ORDER — HYDROCHLOROTHIAZIDE 25 MG PO TABS
25.0000 mg | ORAL_TABLET | Freq: Every day | ORAL | Status: DC
  Administered 2014-11-27 – 2014-11-28 (×2): 25 mg via ORAL
  Filled 2014-11-27 (×2): qty 1

## 2014-11-27 NOTE — Progress Notes (Signed)
Patient ID: Caroline Williams, female   DOB: 1961-06-10, 54 y.o.   MRN: [id number] [facility] Surgery Progress Note:   2 Days Post-Op  Subjective: Mental status is clear.  Nauseated this morning Objective: Vital signs in last 24 hours: Temp:  [97.9 F (36.6 C)-99.3 F (37.4 C)] 98.9 F (37.2 C) (06/15 1845) Pulse Rate:  [78-93] 89 (06/15 1845) Resp:  [12-18] 18 (06/15 1845) BP: (156-205)/(80-105) 156/81 mmHg (06/15 1717) SpO2:  [93 %-99 %] 96 % (06/15 1845)  Intake/Output from previous day: 06/14 0701 - 06/15 0700 In: 2693.3 [P.O.:200; I.V.:2493.3] Out: 2200 [Urine:2200] Intake/Output this shift:    Physical Exam: Work of breathing is not labored.  More upper abdominal incisional pain  Lab Results:  Results for orders placed or performed during the hospital encounter of 11/25/14 (from the past 48 hour(s))  Glucose, capillary     Status: Abnormal   Collection Time: 11/26/14 12:25 AM  Result Value Ref Range   Glucose-Capillary 160 (H) 65 - 99 mg/dL  CBC WITH DIFFERENTIAL     Status: Abnormal   Collection Time: 11/26/14  3:45 AM  Result Value Ref Range   WBC 10.9 (H) 4.0 - 10.5 K/uL   RBC 4.85 3.87 - 5.11 MIL/uL   Hemoglobin 12.2 12.0 - 15.0 g/dL   HCT 38.7 36.0 - 46.0 %   MCV 79.8 78.0 - 100.0 fL   MCH 25.2 (L) 26.0 - 34.0 pg   MCHC 31.5 30.0 - 36.0 g/dL   RDW 18.0 (H) 11.5 - 15.5 %   Platelets 286 150 - 400 K/uL   Neutrophils Relative % 84 (H) 43 - 77 %   Neutro Abs 9.1 (H) 1.7 - 7.7 K/uL   Lymphocytes Relative 10 (L) 12 - 46 %   Lymphs Abs 1.1 0.7 - 4.0 K/uL   Monocytes Relative 6 3 - 12 %   Monocytes Absolute 0.7 0.1 - 1.0 K/uL   Eosinophils Relative 0 0 - 5 %   Eosinophils Absolute 0.0 0.0 - 0.7 K/uL   Basophils Relative 0 0 - 1 %   Basophils Absolute 0.0 0.0 - 0.1 K/uL  Glucose, capillary     Status: Abnormal   Collection Time: 11/26/14  5:11 AM  Result Value Ref Range   Glucose-Capillary 143 (H) 65 - 99 mg/dL  Glucose, capillary     Status: Abnormal    Collection Time: 11/26/14  7:50 AM  Result Value Ref Range   Glucose-Capillary 109 (H) 65 - 99 mg/dL  Glucose, capillary     Status: Abnormal   Collection Time: 11/26/14  4:23 PM  Result Value Ref Range   Glucose-Capillary 110 (H) 65 - 99 mg/dL  Hemoglobin and hematocrit, blood     Status: Abnormal   Collection Time: 11/26/14  5:00 PM  Result Value Ref Range   Hemoglobin 11.9 (L) 12.0 - 15.0 g/dL   HCT 37.6 36.0 - 46.0 %  Glucose, capillary     Status: Abnormal   Collection Time: 11/26/14  7:51 PM  Result Value Ref Range   Glucose-Capillary 111 (H) 65 - 99 mg/dL  Glucose, capillary     Status: Abnormal   Collection Time: 11/27/14 12:16 AM  Result Value Ref Range   Glucose-Capillary 131 (H) 65 - 99 mg/dL  Glucose, capillary     Status: Abnormal   Collection Time: 11/27/14  4:10 AM  Result Value Ref Range   Glucose-Capillary 134 (H) 65 - 99 mg/dL  CBC with Differential  Status: Abnormal   Collection Time: 11/27/14  4:39 AM  Result Value Ref Range   WBC 9.0 4.0 - 10.5 K/uL   RBC 4.99 3.87 - 5.11 MIL/uL   Hemoglobin 12.6 12.0 - 15.0 g/dL   HCT 39.3 36.0 - 46.0 %   MCV 78.8 78.0 - 100.0 fL   MCH 25.3 (L) 26.0 - 34.0 pg   MCHC 32.1 30.0 - 36.0 g/dL   RDW 18.0 (H) 11.5 - 15.5 %   Platelets 258 150 - 400 K/uL   Neutrophils Relative % 78 (H) 43 - 77 %   Neutro Abs 7.0 1.7 - 7.7 K/uL   Lymphocytes Relative 16 12 - 46 %   Lymphs Abs 1.4 0.7 - 4.0 K/uL   Monocytes Relative 6 3 - 12 %   Monocytes Absolute 0.5 0.1 - 1.0 K/uL   Eosinophils Relative 0 0 - 5 %   Eosinophils Absolute 0.0 0.0 - 0.7 K/uL   Basophils Relative 0 0 - 1 %   Basophils Absolute 0.0 0.0 - 0.1 K/uL  Glucose, capillary     Status: Abnormal   Collection Time: 11/27/14  8:26 AM  Result Value Ref Range   Glucose-Capillary 131 (H) 65 - 99 mg/dL  Glucose, capillary     Status: Abnormal   Collection Time: 11/27/14 11:52 AM  Result Value Ref Range   Glucose-Capillary 112 (H) 65 - 99 mg/dL  Glucose, capillary      Status: Abnormal   Collection Time: 11/27/14  3:56 PM  Result Value Ref Range   Glucose-Capillary 118 (H) 65 - 99 mg/dL  Glucose, capillary     Status: Abnormal   Collection Time: 11/27/14  7:28 PM  Result Value Ref Range   Glucose-Capillary 116 (H) 65 - 99 mg/dL    Radiology/Results: Dg Ugi W/water Sol Cm  11/26/2014   CLINICAL DATA:  Gastric bypass, postop day 1  EXAM: WATER SOLUBLE UPPER GI SERIES  TECHNIQUE: Single-column upper GI series was performed using water soluble contrast.  CONTRAST:  50 cc Omnipaque 300  COMPARISON:  08/20/2014  FLUOROSCOPY TIME:  Radiation Exposure Index (as provided by the fluoroscopic device):  If the device does not provide the exposure index:  Fluoroscopy Time (in minutes and seconds):  1 minutes, 59 seconds  Number of Acquired Images:  8  FINDINGS: Contrast filled the gastric pouch but with slow to empty through the gastrojejunostomy. This slow emptying caused initially indistinct appearance of the jejunum which improved using angulated views and delayed imaging.  No leak or complicating feature of gastric bypass is identified. Jejunal loops distal to the gastrojejunostomy patent and unremarkable.  IMPRESSION: 1. No leak or complicating feature related to gastrojejunostomy identified.   Electronically Signed   By: Van Clines M.D.   On: 11/26/2014 10:03    Anti-infectives: Anti-infectives    Start     Dose/Rate Route Frequency Ordered Stop   11/25/14 0606  cefOXitin (MEFOXIN) 2 g in dextrose 5 % 50 mL IVPB     2 g 100 mL/hr over 30 Minutes Intravenous On call to O.R. 11/25/14 0606 11/25/14 1000      Assessment/Plan: Problem List: Patient Active Problem List   Diagnosis Date Noted  . S/P gastric bypass 11/25/2014  . Osteoarthritis of right knee 09/05/2013  . Diabetes mellitus with neuropathy 06/20/2013  . Chronic pain of right knee 06/20/2013  . Morbid obesity 07/05/2012    Not ready for discharge.   2 Days Post-Op    LOS: 2  days    Matt B. Hassell Done, MD, [facility] [phone] beeper [phone]  11/27/2014 10:02 PM

## 2014-11-27 NOTE — Progress Notes (Signed)
Patient alert and oriented, Post op day 2.  Provided support and encouragement.  Encouraged pulmonary toilet, ambulation and small sips of liquids.  Attempted to start protein shake but patient had nausea and was unable to tolerate, at this point not trying ice due to nausea. All questions answered.  Will continue to monitor.

## 2014-11-27 NOTE — Progress Notes (Signed)
Spoke with Dr. Hassell Done re: patient's reluctance to ambulate, as well as poor PO intake.  Also discussed V/S -- BP 160s-170s/89-95 and Temp 99.5.  Orders received to restart home BP medications, will encourage ambulation and pulmonary toilet.  Will continue to monitor

## 2014-11-28 LAB — CBC WITH DIFFERENTIAL/PLATELET
Basophils Absolute: 0 10*3/uL (ref 0.0–0.1)
Basophils Relative: 0 % (ref 0–1)
Eosinophils Absolute: 0.1 10*3/uL (ref 0.0–0.7)
Eosinophils Relative: 1 % (ref 0–5)
HCT: 43.1 % (ref 36.0–46.0)
Hemoglobin: 13.8 g/dL (ref 12.0–15.0)
Lymphocytes Relative: 17 % (ref 12–46)
Lymphs Abs: 1.5 10*3/uL (ref 0.7–4.0)
MCH: 25.4 pg — ABNORMAL LOW (ref 26.0–34.0)
MCHC: 32 g/dL (ref 30.0–36.0)
MCV: 79.4 fL (ref 78.0–100.0)
Monocytes Absolute: 0.5 10*3/uL (ref 0.1–1.0)
Monocytes Relative: 6 % (ref 3–12)
Neutro Abs: 6.6 10*3/uL (ref 1.7–7.7)
Neutrophils Relative %: 76 % (ref 43–77)
Platelets: 300 10*3/uL (ref 150–400)
RBC: 5.43 MIL/uL — ABNORMAL HIGH (ref 3.87–5.11)
RDW: 17.9 % — ABNORMAL HIGH (ref 11.5–15.5)
WBC: 8.7 10*3/uL (ref 4.0–10.5)

## 2014-11-28 LAB — GLUCOSE, CAPILLARY
Glucose-Capillary: 103 mg/dL — ABNORMAL HIGH (ref 65–99)
Glucose-Capillary: 107 mg/dL — ABNORMAL HIGH (ref 65–99)
Glucose-Capillary: 113 mg/dL — ABNORMAL HIGH (ref 65–99)

## 2014-11-28 NOTE — Discharge Summary (Signed)
Physician Discharge Summary  Patient ID: Caroline Williams MRN: [id number] DOB/AGE: 1961-02-22 54 y.o.  Admit date: 11/25/2014 Discharge date: 11/28/2014  Admission Diagnoses:  Morbid obesity  Discharge Diagnoses:  same  Principal Problem:   Lap gastric bypass June 2016   Surgery:  Laparoscopic roux en Y gastric bypass  Discharged Condition: improved  Hospital Course:   Had bypass and endoscopy;  PD 1 swallow looked ok.  Begun on water but nausea precluded advancement until PD 2.  Ready for discharge on PD 3-"I'm ready to go home-I had a bowel movement"    Consults: none  Significant Diagnostic Studies: UGI    Discharge Exam: Blood pressure 140/85, pulse 79, temperature 98.8 F (37.1 C), temperature source Oral, resp. rate 18, height 5\' 5"  (1.651 m), weight 149.687 kg (330 lb), SpO2 97 %. Incisions OK  Disposition: 01-Home or Self Care  Discharge Instructions    Ambulate hourly while awake    Complete by:  As directed      Call MD for:  difficulty breathing, headache or visual disturbances    Complete by:  As directed      Call MD for:  persistant dizziness or light-headedness    Complete by:  As directed      Call MD for:  persistant nausea and vomiting    Complete by:  As directed      Call MD for:  redness, tenderness, or signs of infection (pain, swelling, redness, odor or green/yellow discharge around incision site)    Complete by:  As directed      Call MD for:  severe uncontrolled pain    Complete by:  As directed      Call MD for:  temperature >101 F    Complete by:  As directed      Diet bariatric full liquid    Complete by:  As directed      Discharge instructions    Complete by:  As directed   Follow bariatric dietary guidelines Avoid aspirin and NSAID products Followup with your primary care doctor about your blood pressure management     Discharge wound care:    Complete by:  As directed   May shower     Incentive spirometry    Complete by:  As  directed   Perform hourly while awake            Medication List    STOP taking these medications        diclofenac sodium 1 % Gel  Commonly known as:  VOLTAREN      TAKE these medications        ADVAIR DISKUS 500-50 MCG/DOSE Aepb  Generic drug:  Fluticasone-Salmeterol  Inhale 1 puff into the lungs 2 (two) times daily.     albuterol 108 (90 BASE) MCG/ACT inhaler  Commonly known as:  PROVENTIL HFA;VENTOLIN HFA  Inhale 2 puffs into the lungs every 6 (six) hours as needed for wheezing or shortness of breath. Shortness of breath     ALPRAZolam 1 MG tablet  Commonly known as:  XANAX  Take 1 mg by mouth 4 (four) times daily.     B-12 PO  Take 1 tablet by mouth every morning.     EPINEPHrine 0.3 mg/0.3 mL Soaj injection  Commonly known as:  EPI-PEN     fluticasone 50 MCG/ACT nasal spray  Commonly known as:  FLONASE  Place 2 sprays into the nose daily as needed (congestion).     gabapentin 600  MG tablet  Commonly known as:  NEURONTIN  Take 600 mg by mouth 2 (two) times daily.     levocetirizine 5 MG tablet  Commonly known as:  XYZAL  Take 5 mg by mouth daily as needed for allergies.     levothyroxine 125 MCG tablet  Commonly known as:  SYNTHROID, LEVOTHROID  Take 125 mcg by mouth every morning.     Lidocaine 0.5 % Gel  Apply 1 application topically 2 (two) times daily as needed (knee pain.).     Lidocaine-Aloe Vera 0.5 % Gel  Apply 1 application topically daily as needed (chornic pain in legs.).     losartan-hydrochlorothiazide 100-25 MG per tablet  Commonly known as:  HYZAAR  Take 1 tablet by mouth every morning.     magnesium hydroxide 400 MG/5ML suspension  Commonly known as:  MILK OF MAGNESIA  Take 15 mLs by mouth daily as needed for mild constipation.     metFORMIN 1000 MG tablet  Commonly known as:  GLUCOPHAGE  Take 1,000 mg by mouth 2 (two) times daily with a meal.     methylphenidate 20 MG tablet  Commonly known as:  RITALIN  Take 20 mg by mouth  2 (two) times daily with breakfast and lunch.     morphine 15 MG 12 hr tablet  Commonly known as:  MS CONTIN  Take 1 tablet (15 mg total) by mouth every 12 (twelve) hours.     multivitamin tablet  Take 1 tablet by mouth every morning.     nabumetone 750 MG tablet  Commonly known as:  RELAFEN  Take 750 mg by mouth 2 (two) times daily as needed for mild pain.     omeprazole 40 MG capsule  Commonly known as:  PRILOSEC  Take 40 mg by mouth daily as needed (acid reflex).     Oxycodone HCl 10 MG Tabs  Take 1 tablet (10 mg total) by mouth every 6 (six) hours as needed (for pain).     QUEtiapine 100 MG tablet  Commonly known as:  SEROQUEL  Take 100 mg by mouth at bedtime.     REXULTI 2 MG Tabs  Generic drug:  Brexpiprazole  Take 1 tablet by mouth at bedtime.     ZETIA 10 MG tablet  Generic drug:  ezetimibe  Take 10 mg by mouth every morning.     zolpidem 10 MG tablet  Commonly known as:  AMBIEN  Take 1 tablet by mouth at bedtime.           Follow-up Information    Follow up with Pedro Earls, MD.   Specialty:  General Surgery   Contact information:   [place] [address] [phone]       Signed: Pedro Earls 11/28/2014, 7:57 AM

## 2014-11-28 NOTE — Care Management Note (Signed)
Case Management Note  Patient Details  Name: Caroline Williams MRN: [id number] Date of Birth: 14-Aug-1960  Subjective/Objective:        S/p lap gastric bypass            Action/Plan: Discharge planning, spoke with patient at bedside. Feels good about d/c today. Has Aide at home 80h/wk 7d/wk. No d/c needs identified.   Expected Discharge Date:                  Expected Discharge Plan:  Home/Self Care  In-House Referral:  NA  Discharge planning Services  CM Consult  Post Acute Care Choice:  NA Choice offered to:  NA  DME Arranged:  N/A DME Agency:  NA  HH Arranged:  NA HH Agency:  NA  Status of Service:  Completed, signed off  Medicare Important Message Given:  Yes Date Medicare IM Given:  11/28/14 Medicare IM give by:  Sunday Spillers RN CM  Date Additional Medicare IM Given:    Additional Medicare Important Message give by:     If discussed at [place] of Stay Meetings, dates discussed:    Additional Comments:  Guadalupe Maple, RN 11/28/2014, 11:14 AM

## 2014-11-28 NOTE — Discharge Instructions (Signed)

## 2014-11-28 NOTE — Progress Notes (Signed)
Patient alert and oriented, pain is controlled. Patient is tolerating fluids,  advance to protein shake yesterday, patient didn't tolerated, but was able to tolerate today. Reviewed Gastric Bypass discharge instructions with patient and patient is able to articulate understanding. Provided information on BELT program, Support Group and [facility] outpatient pharmacy. All questions answered, will continue to monitor.

## 2014-12-02 ENCOUNTER — Telehealth (HOSPITAL_COMMUNITY): Payer: Self-pay

## 2014-12-02 NOTE — Telephone Encounter (Signed)
Made discharge phone call to patient per DROP protocol. Asking the following questions.    1. Do you have someone to care for you now that you are home?  yes 2. Are you having pain now that is not relieved by your pain medication?  no 3. Are you able to drink the recommended daily amount of fluids (48 ounces minimum/day) and protein (60-80 grams/day) as prescribed by the dietitian or nutritional counselor?  yes 4. Are you taking the vitamins and minerals as prescribed?  yes 5. Do you have the "on call" number to contact your surgeon if you have a problem or question?  yes 6. Are your incisions free of redness, swelling or drainage? (If steri strips, address that these can fall off, shower as tolerated) yes 7. Have your bowels moved since your surgery?  If not, are you passing gas?  Yes, yes 8. Are you up and walking 3-4 times per day?  yes    1. Do you have an appointment made to see your surgeon in the next month?  yes 2. Were you provided your discharge medications before your surgery or before you were discharged from the hospital and are you taking them without problem?  yes 3. Were you provided phone numbers to the clinic/surgeon's office?  yes 4. Did you watch the patient education video module in the (clinic, surgeon's office, etc.) before your surgery? yes 5. Do you have a discharge checklist that was provided to you in the hospital to reference with instructions on how to take care of yourself after surgery?  yes 6. Did you see a dietitian or nutritional counselor while you were in the hospital?  yes 7. Do you have an appointment to see a dietitian or nutritional counselor in the next month?  yes

## 2014-12-07 DIAGNOSIS — F250 Schizoaffective disorder, bipolar type: Secondary | ICD-10-CM | POA: Diagnosis not present

## 2014-12-10 ENCOUNTER — Ambulatory Visit: Payer: Medicare Other

## 2014-12-19 ENCOUNTER — Telehealth: Payer: Self-pay | Admitting: *Deleted

## 2014-12-19 NOTE — Telephone Encounter (Signed)
She is discharged from this practice given that she was given a warning in May and obviously knew what she was doing when she filled both RX's on the same date.

## 2014-12-19 NOTE — Telephone Encounter (Signed)
Received a request from pharmacy for prior auth for Oxycodone 10 mg IR tablets.Will initiate. In checking when last fill was received on [place] I found that on 10/14/14 she filled #120 pills given by Dr Naaman Plummer at her usual pharmacy, but received 233ml oxycodone 5mg /ml from a separate pharmacy on the same date. (red flag) This Rx was given to her by Dr Kaylyn Lim presumably in preparation for her surgery which ultimately was done 11/25/14 ,though no mention in his note the Rx was given. We do allow for surgeons to prescribe in post op period but expect this to be revealed to [place] by patient and no medication from this office to be taken in conjunction with the surgical Rx.  She received Rx from our office 11/14/14 by Danella Sensing NP and was given additional Rx for meds to cover July and her return appt is 01/13/15.  She was covered by our office for all months but got the additional liquid from Dr Hassell Done.  Therefore she should have medication (pills) when she returns.  The problem is that she did not reveal this medication was given to her and took the Rx from [place] for both months.  She will need to account for the extra medication upon her return to the office, especially in light of her warning she received 10/14/14 from Dr Naaman Plummer due to having [facility] in her last UDS and any further discrepancies would lead to discharge.

## 2014-12-19 NOTE — Telephone Encounter (Signed)
Discharge letter printed and will be mailed.

## 2014-12-20 DIAGNOSIS — F250 Schizoaffective disorder, bipolar type: Secondary | ICD-10-CM | POA: Diagnosis not present

## 2014-12-24 ENCOUNTER — Encounter: Payer: Medicare Other | Attending: Surgery

## 2014-12-24 DIAGNOSIS — E119 Type 2 diabetes mellitus without complications: Secondary | ICD-10-CM | POA: Insufficient documentation

## 2014-12-24 DIAGNOSIS — Z6841 Body Mass Index (BMI) 40.0 and over, adult: Secondary | ICD-10-CM | POA: Insufficient documentation

## 2014-12-24 DIAGNOSIS — Z01818 Encounter for other preprocedural examination: Secondary | ICD-10-CM | POA: Diagnosis not present

## 2014-12-24 NOTE — Progress Notes (Signed)
Bariatric Class:  Appt start time: 1530 end time:  1630.  2 Week Post-Operative Nutrition Class  Patient was seen on 12/24/2014 for Post-[facility]ative [facility]   Surgery date: 11/25/2014 Surgery type: RYGB Start weight at [facility]: 326.5 on 11/08/13 Weight today: 307 lbs  Weight change: 18 lbs  TANITA  BODY COMP RESULTS  10/07/14 12/24/14   BMI (kg/m^2) 54.1 51.1   Fat Mass (lbs) 189 171.5   Fat Free Mass (lbs) 136 135.5   Total Body Water (lbs) 99.5 99.0     The following the learning objectives were met by the patient during this course:  Identifies Phase 3A (Soft, High Proteins) Dietary Goals and will begin from 2 weeks post-operatively to 2 months post-operatively  Identifies appropriate sources of fluids and proteins   States protein recommendations and appropriate sources post-operatively  Identifies the need for appropriate texture modifications, mastication, and bite sizes when consuming solids  Identifies appropriate multivitamin and calcium sources post-operatively  Describes the need for physical activity post-operatively and will follow MD recommendations  States when to call healthcare provider regarding medication questions or post-operative complications  Handouts given during class include:  Phase 3A: Soft, High Protein Diet Handout  Follow-Up Plan: Patient will follow-up at [facility] in 6 weeks for 2 month post-op nutrition visit for diet advancement per MD.

## 2014-12-26 ENCOUNTER — Ambulatory Visit: Payer: Medicare Other | Admitting: Registered Nurse

## 2015-01-03 DIAGNOSIS — F250 Schizoaffective disorder, bipolar type: Secondary | ICD-10-CM | POA: Diagnosis not present

## 2015-01-13 ENCOUNTER — Ambulatory Visit: Payer: Medicare Other | Admitting: Registered Nurse

## 2015-01-21 DIAGNOSIS — F4321 Adjustment disorder with depressed mood: Secondary | ICD-10-CM | POA: Diagnosis not present

## 2015-01-21 DIAGNOSIS — F250 Schizoaffective disorder, bipolar type: Secondary | ICD-10-CM | POA: Diagnosis not present

## 2015-01-29 DIAGNOSIS — G8929 Other chronic pain: Secondary | ICD-10-CM | POA: Insufficient documentation

## 2015-01-29 DIAGNOSIS — M5442 Lumbago with sciatica, left side: Secondary | ICD-10-CM | POA: Diagnosis not present

## 2015-01-29 DIAGNOSIS — M4726 Other spondylosis with radiculopathy, lumbar region: Secondary | ICD-10-CM | POA: Diagnosis not present

## 2015-01-29 DIAGNOSIS — M5441 Lumbago with sciatica, right side: Secondary | ICD-10-CM | POA: Diagnosis not present

## 2015-01-29 DIAGNOSIS — M47816 Spondylosis without myelopathy or radiculopathy, lumbar region: Secondary | ICD-10-CM | POA: Insufficient documentation

## 2015-01-29 DIAGNOSIS — Z6841 Body Mass Index (BMI) 40.0 and over, adult: Secondary | ICD-10-CM | POA: Diagnosis not present

## 2015-02-03 ENCOUNTER — Encounter: Payer: Medicare Other | Attending: Surgery | Admitting: Dietician

## 2015-02-03 DIAGNOSIS — E119 Type 2 diabetes mellitus without complications: Secondary | ICD-10-CM | POA: Insufficient documentation

## 2015-02-03 DIAGNOSIS — Z6841 Body Mass Index (BMI) 40.0 and over, adult: Secondary | ICD-10-CM | POA: Insufficient documentation

## 2015-02-03 DIAGNOSIS — Z01818 Encounter for other preprocedural examination: Secondary | ICD-10-CM | POA: Diagnosis not present

## 2015-02-03 NOTE — Patient Instructions (Addendum)
Goals:  Follow Phase 3B: High Protein + Non-Starchy Vegetables  Eat 3-6 small meals/snacks, every 3-5 hrs  Increase lean protein foods to meet 60g goal  Increase fluid intake to 64oz +  Avoid drinking 15 minutes before, during and 30 minutes after eating  Aim for >30 min of physical activity daily  Remember that you need to eat to lose weight  Start phasing out protein shakes and add real food  Surgery date: 11/25/2014 Surgery type: RYGB Start weight at [facility]: 326.5 on 11/08/13 Weight today: 292 lbs Weight change: 15 lbs Total weight lost: 34.5 lbs  TANITA  BODY COMP RESULTS  10/07/14 12/24/14 02/03/15   BMI (kg/m^2) 54.1 51.1 48.6   Fat Mass (lbs) 189 171.5 154.5   Fat Free Mass (lbs) 136 135.5 137.5   Total Body Water (lbs) 99.5 99.0 100.5

## 2015-02-03 NOTE — Progress Notes (Signed)
  Follow-up visit:  8 Weeks Post-Operative RYGB Surgery  Medical Nutrition Therapy:  Appt start time: 415 end time:  500.  Primary concerns today: Post-operative Bariatric Surgery Nutrition Management. Caroline Williams returns having lost another 15 pounds since post op class. She reports not feeling hungry and drinking 1-2 protein shakes per day. Having some fruit and crackers with chicken salad.  Surgery date: 11/25/2014 Surgery type: RYGB Start weight at [facility]: 326.5 on 11/08/13 Weight today: 292 lbs Weight change: 15 lbs Total weight lost: 34.5 lbs  TANITA  BODY COMP RESULTS  10/07/14 12/24/14 02/03/15   BMI (kg/m^2) 54.1 51.1 48.6   Fat Mass (lbs) 189 171.5 154.5   Fat Free Mass (lbs) 136 135.5 137.5   Total Body Water (lbs) 99.5 99.0 100.5    Preferred Learning Style:   No preference indicated   Learning Readiness:   Ready  24-hr recall: B (AM): Premier protein shake (30g) Snk (AM):   L (PM): chicken salad with sliced tomato (39Q) Snk (PM):   D (PM): Premier protein shake OR 2 oz grilled pork chop, green beans and squash (14-30g) Snk (PM):   Fluid intake: 48 oz water, unsweet tea with Stevia, 11-22 oz protein shake (about 64 oz) Estimated total protein intake: 58-74g  Medications: no longer taking Metformin, blood pressure, or cholesterol meds Supplementation: taking  CBG monitoring: morning and evening Average CBG per patient: 119-130 mg/dL Last patient reported A1c: none since surgery  Using straws: no Drinking while eating: yes, patient states she knows this is not recommended Hair loss: none Carbonated beverages: none N/V/D/C: constipation, "nothing out of the ordinary" Dumping syndrome: none  Recent physical activity:  Physical therapy; getting ready to start at [facility]):  In progress.  Handouts given during visit include:  Phase 3B lean protein + non starchy vegetables   Nutritional Diagnosis:  [place]-3.3 Overweight/obesity  related to past poor dietary habits and physical inactivity as evidenced by patient w/ recent RYGB surgery following dietary guidelines for continued weight loss.     Intervention:  Nutrition counseling provided.  Teaching Method Utilized:  Visual Auditory Hands on  Barriers to learning/adherence to lifestyle change: none  Demonstrated degree of understanding via:  Teach Back   Monitoring/Evaluation:  Dietary intake, exercise, and body weight. Follow up in 2 months for 4 month post-op visit.

## 2015-02-04 ENCOUNTER — Encounter: Payer: Self-pay | Admitting: Dietician

## 2015-02-12 ENCOUNTER — Emergency Department (HOSPITAL_COMMUNITY): Payer: Medicare Other

## 2015-02-12 ENCOUNTER — Encounter (HOSPITAL_COMMUNITY): Payer: Self-pay

## 2015-02-12 ENCOUNTER — Emergency Department (HOSPITAL_COMMUNITY)
Admission: EM | Admit: 2015-02-12 | Discharge: 2015-02-12 | Disposition: A | Discharge status: Home / Self Care | Payer: Medicare Other | Attending: Emergency Medicine | Admitting: Emergency Medicine

## 2015-02-12 DIAGNOSIS — Z8701 Personal history of pneumonia (recurrent): Secondary | ICD-10-CM | POA: Diagnosis not present

## 2015-02-12 DIAGNOSIS — E039 Hypothyroidism, unspecified: Secondary | ICD-10-CM | POA: Insufficient documentation

## 2015-02-12 DIAGNOSIS — E119 Type 2 diabetes mellitus without complications: Secondary | ICD-10-CM | POA: Insufficient documentation

## 2015-02-12 DIAGNOSIS — Y998 Other external cause status: Secondary | ICD-10-CM | POA: Insufficient documentation

## 2015-02-12 DIAGNOSIS — J45909 Unspecified asthma, uncomplicated: Secondary | ICD-10-CM | POA: Insufficient documentation

## 2015-02-12 DIAGNOSIS — Z87891 Personal history of nicotine dependence: Secondary | ICD-10-CM | POA: Insufficient documentation

## 2015-02-12 DIAGNOSIS — Z7951 Long term (current) use of inhaled steroids: Secondary | ICD-10-CM | POA: Diagnosis not present

## 2015-02-12 DIAGNOSIS — S8001XA Contusion of right knee, initial encounter: Secondary | ICD-10-CM | POA: Diagnosis not present

## 2015-02-12 DIAGNOSIS — Y9289 Other specified places as the place of occurrence of the external cause: Secondary | ICD-10-CM | POA: Insufficient documentation

## 2015-02-12 DIAGNOSIS — Z8719 Personal history of other diseases of the digestive system: Secondary | ICD-10-CM | POA: Insufficient documentation

## 2015-02-12 DIAGNOSIS — M1990 Unspecified osteoarthritis, unspecified site: Secondary | ICD-10-CM | POA: Insufficient documentation

## 2015-02-12 DIAGNOSIS — Z9889 Other specified postprocedural states: Secondary | ICD-10-CM | POA: Insufficient documentation

## 2015-02-12 DIAGNOSIS — S8991XA Unspecified injury of right lower leg, initial encounter: Secondary | ICD-10-CM | POA: Diagnosis present

## 2015-02-12 DIAGNOSIS — F319 Bipolar disorder, unspecified: Secondary | ICD-10-CM | POA: Insufficient documentation

## 2015-02-12 DIAGNOSIS — I10 Essential (primary) hypertension: Secondary | ICD-10-CM | POA: Insufficient documentation

## 2015-02-12 DIAGNOSIS — F419 Anxiety disorder, unspecified: Secondary | ICD-10-CM | POA: Insufficient documentation

## 2015-02-12 DIAGNOSIS — Z79899 Other long term (current) drug therapy: Secondary | ICD-10-CM | POA: Diagnosis not present

## 2015-02-12 DIAGNOSIS — M25561 Pain in right knee: Secondary | ICD-10-CM | POA: Diagnosis not present

## 2015-02-12 DIAGNOSIS — W500XXA Accidental hit or strike by another person, initial encounter: Secondary | ICD-10-CM | POA: Insufficient documentation

## 2015-02-12 DIAGNOSIS — Z8669 Personal history of other diseases of the nervous system and sense organs: Secondary | ICD-10-CM | POA: Diagnosis not present

## 2015-02-12 DIAGNOSIS — Y9389 Activity, other specified: Secondary | ICD-10-CM | POA: Insufficient documentation

## 2015-02-12 DIAGNOSIS — E079 Disorder of thyroid, unspecified: Secondary | ICD-10-CM | POA: Insufficient documentation

## 2015-02-12 MED ORDER — OXYCODONE-ACETAMINOPHEN 5-325 MG PO TABS
2.0000 | ORAL_TABLET | Freq: Once | ORAL | Status: AC
  Administered 2015-02-12: 2 via ORAL
  Filled 2015-02-12: qty 2

## 2015-02-12 NOTE — Discharge Instructions (Signed)
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
## 2015-02-12 NOTE — ED Notes (Signed)
Will ambulate pt when she gets off the phone

## 2015-02-12 NOTE — ED Notes (Signed)
MD at bedside. 

## 2015-02-12 NOTE — ED Notes (Signed)
Pt ambulated to restroom with walker without assistance

## 2015-02-12 NOTE — ED Notes (Signed)
Pt discharged to home.  Pt verbalized understanding of discharge instructions and follow up care.  All belongings sent home with pt.
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
## 2015-02-12 NOTE — ED Notes (Signed)
Patient transported to X-ray 

## 2015-02-20 ENCOUNTER — Encounter: Payer: Self-pay | Admitting: Podiatry

## 2015-02-20 ENCOUNTER — Ambulatory Visit (INDEPENDENT_AMBULATORY_CARE_PROVIDER_SITE_OTHER): Payer: Medicare Other | Admitting: Podiatry

## 2015-02-20 VITALS — BP 138/91 | HR 85 | Resp 16

## 2015-02-20 DIAGNOSIS — E119 Type 2 diabetes mellitus without complications: Secondary | ICD-10-CM

## 2015-02-20 DIAGNOSIS — M79606 Pain in leg, unspecified: Secondary | ICD-10-CM | POA: Diagnosis not present

## 2015-02-20 DIAGNOSIS — B351 Tinea unguium: Secondary | ICD-10-CM | POA: Diagnosis not present

## 2015-02-20 DIAGNOSIS — L600 Ingrowing nail: Secondary | ICD-10-CM | POA: Diagnosis not present

## 2015-02-20 NOTE — Patient Instructions (Signed)

## 2015-02-21 ENCOUNTER — Telehealth: Payer: Self-pay | Admitting: *Deleted

## 2015-02-21 NOTE — Telephone Encounter (Signed)
Called patient at [phone] (Home #) to check to see how they were feeling from their ingrown toenail procedure that was performed on Thursday, February 20, 2015. Pt stated, "Toe is still very sore". Pt is not taking an ibuprofen for pain. Pt has soaked toe with some relief.

## 2015-02-21 NOTE — Progress Notes (Signed)
Subjective:     Patient ID: [name], female   DOB: Nov 17, 1960, 54 y.o.   MRN: [id number]  HPI patient states that I have a painful ingrown toenail my left big toe and my nails in general I cannot cut myself they're thick and they can become painful and I'm a diabetic. States her control has been good recently   Review of Systems     Objective:   Physical Exam Neurovascular status found to be intact muscle strength was noted to be adequate with range of motion moderately reduced subtalar joint. Patient's found to have an incurvated left hallux medial border that's painful when pressed and makes shoe gear difficult and does have thickness of nailbeds 1-5 both feet that are painful with dorsal direction and she cannot cut herself with long-term diabetes as risk factor and obesity    Assessment:     Toenail deformity left hallux medial border with pain and mycotic painful nailbeds 1-5 both feet with diabetes and obesity is complicating factors    Plan:     H&P and conditions reviewed viewed. I've recommended removal of the corner the nail and I explained the risk of procedure and she wants to have this performed. I infiltrated 60 mg I can Marcaine mixture remove the medial border exposed matrix and applied phenol 3 applications 30 seconds followed by alcohol lavage and sterile dressing. Gave instructions on soaks and reappoint and also debrided nailbeds 1-5 both feet with no iatrogenic bleeding noted

## 2015-02-26 DIAGNOSIS — M4726 Other spondylosis with radiculopathy, lumbar region: Secondary | ICD-10-CM | POA: Diagnosis not present

## 2015-02-26 DIAGNOSIS — M5441 Lumbago with sciatica, right side: Secondary | ICD-10-CM | POA: Diagnosis not present

## 2015-02-26 DIAGNOSIS — M5442 Lumbago with sciatica, left side: Secondary | ICD-10-CM | POA: Diagnosis not present

## 2015-02-26 DIAGNOSIS — G8929 Other chronic pain: Secondary | ICD-10-CM | POA: Diagnosis not present

## 2015-03-10 ENCOUNTER — Emergency Department (HOSPITAL_COMMUNITY)
Admission: EM | Admit: 2015-03-10 | Discharge: 2015-03-10 | Disposition: A | Discharge status: Home / Self Care | Payer: Medicare Other | Source: Home / Self Care | Attending: Emergency Medicine | Admitting: Emergency Medicine

## 2015-03-10 ENCOUNTER — Emergency Department (HOSPITAL_COMMUNITY): Payer: Medicare Other

## 2015-03-10 ENCOUNTER — Encounter (HOSPITAL_COMMUNITY): Payer: Self-pay | Admitting: Emergency Medicine

## 2015-03-10 ENCOUNTER — Emergency Department: Payer: Medicare Other

## 2015-03-10 DIAGNOSIS — R109 Unspecified abdominal pain: Secondary | ICD-10-CM | POA: Diagnosis not present

## 2015-03-10 DIAGNOSIS — R410 Disorientation, unspecified: Secondary | ICD-10-CM

## 2015-03-10 DIAGNOSIS — R7881 Bacteremia: Secondary | ICD-10-CM | POA: Diagnosis not present

## 2015-03-10 DIAGNOSIS — I10 Essential (primary) hypertension: Secondary | ICD-10-CM | POA: Diagnosis not present

## 2015-03-10 DIAGNOSIS — R509 Fever, unspecified: Secondary | ICD-10-CM | POA: Diagnosis not present

## 2015-03-10 DIAGNOSIS — R51 Headache: Secondary | ICD-10-CM | POA: Diagnosis not present

## 2015-03-10 DIAGNOSIS — A419 Sepsis, unspecified organism: Secondary | ICD-10-CM | POA: Diagnosis not present

## 2015-03-10 DIAGNOSIS — A4151 Sepsis due to Escherichia coli [E. coli]: Secondary | ICD-10-CM | POA: Diagnosis not present

## 2015-03-10 DIAGNOSIS — R52 Pain, unspecified: Secondary | ICD-10-CM

## 2015-03-10 DIAGNOSIS — N12 Tubulo-interstitial nephritis, not specified as acute or chronic: Secondary | ICD-10-CM

## 2015-03-10 DIAGNOSIS — R112 Nausea with vomiting, unspecified: Secondary | ICD-10-CM | POA: Diagnosis not present

## 2015-03-10 LAB — CBC
HCT: 43.3 % (ref 36.0–46.0)
Hemoglobin: 14.4 g/dL (ref 12.0–15.0)
MCH: 26.1 pg (ref 26.0–34.0)
MCHC: 33.3 g/dL (ref 30.0–36.0)
MCV: 78.6 fL (ref 78.0–100.0)
Platelets: 216 10*3/uL (ref 150–400)
RBC: 5.51 MIL/uL — ABNORMAL HIGH (ref 3.87–5.11)
RDW: 17.7 % — ABNORMAL HIGH (ref 11.5–15.5)
WBC: 16 10*3/uL — ABNORMAL HIGH (ref 4.0–10.5)

## 2015-03-10 LAB — URINALYSIS, ROUTINE W REFLEX MICROSCOPIC
Glucose, UA: NEGATIVE mg/dL
Ketones, ur: 15 mg/dL — AB
Nitrite: NEGATIVE
Protein, ur: >300 mg/dL — AB
Specific Gravity, Urine: 1.027 (ref 1.005–1.030)
Urobilinogen, UA: 4 mg/dL — ABNORMAL HIGH (ref 0.0–1.0)
pH: 6 (ref 5.0–8.0)

## 2015-03-10 LAB — URINE MICROSCOPIC-ADD ON

## 2015-03-10 LAB — COMPREHENSIVE METABOLIC PANEL
ALT: 28 U/L (ref 14–54)
AST: 29 U/L (ref 15–41)
Albumin: 4 g/dL (ref 3.5–5.0)
Alkaline Phosphatase: 102 U/L (ref 38–126)
Anion gap: 9 (ref 5–15)
BUN: 7 mg/dL (ref 6–20)
CO2: 24 mmol/L (ref 22–32)
Calcium: 9.4 mg/dL (ref 8.9–10.3)
Chloride: 104 mmol/L (ref 101–111)
Creatinine, Ser: 0.84 mg/dL (ref 0.44–1.00)
GFR calc Af Amer: >60 mL/min (ref >60)
GFR calc non Af Amer: >60 mL/min (ref >60)
Glucose, Bld: 142 mg/dL — ABNORMAL HIGH (ref 65–99)
Potassium: 2.8 mmol/L — ABNORMAL LOW (ref 3.5–5.1)
Sodium: 137 mmol/L (ref 135–145)
Total Bilirubin: 1.1 mg/dL (ref 0.3–1.2)
Total Protein: 8 g/dL (ref 6.5–8.1)

## 2015-03-10 LAB — I-STAT TROPONIN, ED: Troponin i, poc: 0.01 ng/mL (ref 0.00–0.08)

## 2015-03-10 LAB — I-STAT CG4 LACTIC ACID, ED
Lactic Acid, Venous: 1.51 mmol/L (ref 0.5–2.0)
Lactic Acid, Venous: 0.65 mmol/L (ref 0.5–2.0)

## 2015-03-10 LAB — LIPASE, BLOOD: Lipase: 11 U/L — ABNORMAL LOW (ref 22–51)

## 2015-03-10 LAB — CBG MONITORING, ED: Glucose-Capillary: 141 mg/dL — ABNORMAL HIGH (ref 65–99)

## 2015-03-10 MED ORDER — ONDANSETRON 4 MG PO TBDP: 4.0000 mg | ORAL_TABLET | Freq: Three times a day (TID) | ORAL | Status: DC | PRN

## 2015-03-10 MED ORDER — DEXTROSE 5 % IV SOLN
2.0000 g | Freq: Once | INTRAVENOUS | Status: AC
  Administered 2015-03-10: 2 g via INTRAVENOUS
  Filled 2015-03-10: qty 2

## 2015-03-10 MED ORDER — ONDANSETRON HCL 4 MG/2ML IJ SOLN
4.0000 mg | INTRAMUSCULAR | Status: AC
  Administered 2015-03-10: 4 mg via INTRAVENOUS
  Filled 2015-03-10: qty 2

## 2015-03-10 MED ORDER — CEPHALEXIN 500 MG PO CAPS: 500.0000 mg | ORAL_CAPSULE | Freq: Two times a day (BID) | ORAL | Status: DC

## 2015-03-10 MED ORDER — ACETAMINOPHEN 325 MG PO TABS
650.0000 mg | ORAL_TABLET | Freq: Once | ORAL | Status: AC
  Administered 2015-03-10: 650 mg via ORAL
  Filled 2015-03-10: qty 2

## 2015-03-10 MED ORDER — POTASSIUM CHLORIDE 10 MEQ/100ML IV SOLN
10.0000 meq | Freq: Once | INTRAVENOUS | Status: AC
  Administered 2015-03-10: 10 meq via INTRAVENOUS
  Filled 2015-03-10: qty 100

## 2015-03-10 MED ORDER — POTASSIUM CHLORIDE CRYS ER 20 MEQ PO TBCR
40.0000 meq | EXTENDED_RELEASE_TABLET | Freq: Once | ORAL | Status: AC
  Administered 2015-03-10: 40 meq via ORAL
  Filled 2015-03-10: qty 2

## 2015-03-10 MED ORDER — SODIUM CHLORIDE 0.9 % IV BOLUS (SEPSIS)
1000.0000 mL | Freq: Once | INTRAVENOUS | Status: AC
  Administered 2015-03-10: 1000 mL via INTRAVENOUS
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
## 2015-03-10 NOTE — Discharge Instructions (Signed)
1. Medications: keflex, usual home medications 2. Treatment: rest, drink plenty of fluids 3. Follow Up: please followup with your primary doctor for discussion of your diagnoses and further evaluation after today's visit; please return to the ER for high fever, loss of consciousness, severe abdominal pain, persistent vomiting   Abdominal Pain Many things can cause abdominal pain. Usually, abdominal pain is not caused by a disease and will improve without treatment. It can often be observed and treated at home. Your health care provider will do a physical exam and possibly order blood tests and X-rays to help determine the seriousness of your pain. However, in many cases, more time must pass before a clear cause of the pain can be found. Before that point, your health care provider may not know if you need more testing or further treatment. HOME CARE INSTRUCTIONS  Monitor your abdominal pain for any changes. The following actions may help to alleviate any discomfort you are experiencing:  Only take over-the-counter or prescription medicines as directed by your health care provider.  Do not take laxatives unless directed to do so by your health care provider.  Try a clear liquid diet (broth, tea, or water) as directed by your health care provider. Slowly move to a bland diet as tolerated. SEEK MEDICAL CARE IF:  You have unexplained abdominal pain.  You have abdominal pain associated with nausea or diarrhea.  You have pain when you urinate or have a bowel movement.  You experience abdominal pain that wakes you in the night.  You have abdominal pain that is worsened or improved by eating food.  You have abdominal pain that is worsened with eating fatty foods.  You have a fever. SEEK IMMEDIATE MEDICAL CARE IF:   Your pain does not go away within 2 hours.  You keep throwing up (vomiting).  Your pain is felt only in portions of the abdomen, such as the right side or the left lower  portion of the abdomen.  You pass bloody or black tarry stools. MAKE SURE YOU:  Understand these instructions.   Will watch your condition.   Will get help right away if you are not doing well or get worse.  Document Released: 03/10/2005 Document Revised: 06/05/2013 Document Reviewed: 02/07/2013 [facility] Patient Information 2015 [place], [place]. This information is not intended to replace advice given to you by your health care provider. Make sure you discuss any questions you have with your health care provider.  Pyelonephritis, Adult Pyelonephritis is a kidney infection. A kidney infection can happen quickly, or it can last for a long time. HOME CARE   Take your medicine (antibiotics) as told. Finish it even if you start to feel better.  Keep all doctor visits as told.  Drink enough fluids to keep your pee (urine) clear or pale yellow.  Only take medicine as told by your doctor. GET HELP RIGHT AWAY IF:   You have a fever or lasting symptoms for more than 2-3 days.  You have a fever and your symptoms suddenly get worse.  You cannot take your medicine or drink fluids as told.  You have chills and shaking.  You feel very weak or pass out (faint).  You do not feel better after 2 days. MAKE SURE YOU:  Understand these instructions.  Will watch your condition.  Will get help right away if you are not doing well or get worse. Document Released: 07/08/2004 Document Revised: 11/30/2011 Document Reviewed: 11/18/2010 [facility] Patient Information 2015 [employer], [place]. This information  is not intended to replace advice given to you by your health care provider. Make sure you discuss any questions you have with your health care provider.   Emergency Department Resource Guide 1) Find a Doctor and Pay Out of Pocket Although you won't have to find out who is covered by your insurance plan, it is a good idea to ask around and get recommendations. You will then need to call the  office and see if the doctor you have chosen will accept you as a new patient and what types of options they offer for patients who are self-pay. Some doctors offer discounts or will set up payment plans for their patients who do not have insurance, but you will need to ask so you aren't surprised when you get to your appointment.  2) Contact Your Local Health Department Not all health departments have doctors that can see patients for sick visits, but many do, so it is worth a call to see if yours does. If you don't know where your local health department is, you can check in your phone book. The CDC also has a tool to help you locate your state's health department, and many state websites also have listings of all of their local health departments.  3) Find a [facility] If your illness is not likely to be very severe or complicated, you may want to try a walk in clinic. These are popping up all over the country in pharmacies, drugstores, and shopping centers. They're usually staffed by nurse practitioners or physician assistants that have been trained to treat common illnesses and complaints. They're usually fairly quick and inexpensive. However, if you have serious medical issues or chronic medical problems, these are probably not your best option.  No Primary Care Doctor: - Call Health Connect at  [phone] - they can help you locate a primary care doctor that  accepts your insurance, provides certain services, etc. - Physician Referral Service- [phone]  Chronic Pain Problems: Organization         Address  Phone   Notes  [facility]  [phone] Patients need to be referred by their primary care doctor.   Medication Assistance: Organization         Address  Phone   Notes  [facility] Medication [facility] [address] [place], [place] [address] [phone] --Must be a resident of [facility] -- Must have NO insurance coverage  whatsoever (no Medicaid/ Medicare, etc.) -- The pt. MUST have a primary care doctor that directs their care regularly and follows them in the community   [facility]ist  [phone]   [employer]  [phone]    Agencies that provide inexpensive medical care: Organization         Address  Phone   Notes  [place]  [phone]   [name]    [phone]   [facility] [place], [place] [address] [phone]   [place] [address], [place] [phone]   Planned Parenthood    [phone]   [facility]    [phone]   [place] and [place] [address], [place] Phone:  [phone], Fax:  [phone] Hours of Operation:  9 am - 6 pm, M-F.  Also accepts Medicaid/Medicare and self-pay.  [facility] for [place] [address] [place],  [address], [place] Phone: [phone], Fax: [phone]. Hours of Operation:  8:30 am - 5:30 pm, M-F.  Also accepts Medicaid and self-pay.  [facility] [place] [address], [place] Phone: [phone]   [place], [place], [place] [phone], Ext. [phone] Mondays & Thursdays: 7-9 AM.  First 15 patients are seen on a first come, first serve basis.    [place] Providers:  Organization         Address  Phone   Notes  [facility] [address], [place] [phone] Also accepts self-pay patients.  [facility] [address], [place]  [phone]   [place], [address], [place] [phone]   [facility] [address], [place] [phone]   Lucianne Lei [address], [place]   [phone] Only accepts [place] Access [place] patients after they have their name applied  to their card.   Self-Pay (no insurance) in [facility]:  Organization         Address  Phone   Notes  Sickle Cell Patients, [facility] Internal Medicine [place] [phone]   [facility] Urgent Care [place] [phone]   [name] [place]  [place], [place], [place] [phone]   [facility]/Dr. Osei-Bonsu  [address] [place] or [address], [place] [phone] Phone number for both [employer] and [place] locations is the same.  Urgent Medical and [facility] [address] [place] [phone]   [facility] [address] [place] or [address] [phone]   [facility] [address], [place] [phone], phone; [phone], fax Sees patients 1st and 3rd Saturday of every month.  Must not qualify for public or private insurance (i.e. Medicaid, Medicare, [place] Health Choice, Veterans' Benefits)  Household income should be no more than 200% of the poverty level The clinic cannot treat you if you are pregnant or think you are pregnant  Sexually transmitted diseases are not treated at the clinic.    Dental Care: Organization         Address  Phone  Notes  [facility] Department of [facility] [place] [phone] Accepts children up to age 50 who are enrolled in [place] or [place]; pregnant women with a Medicaid card; and children who have applied for Medicaid or [place] Health Choice, but were declined, whose parents can pay a reduced fee at time of service.  [facility] Department of [facility]  [address], [place] [phone] Accepts children up to age 62 who are enrolled in [place] or [place]; pregnant women with a Medicaid card; and children who have applied for Medicaid or   Health Choice, but were declined, whose parents can pay a reduced fee at time of service.  [place] Adult Dental Access PROGRAM  [facility] [phone] Patients are seen by appointment only. Walk-ins are not accepted. [place] will see patients 51 years of age and older. Monday - Tuesday (8am-5pm) Most Wednesdays (8:30-5pm) $30 per visit, cash only  [place]  [address], [place] [phone] Patients are seen by appointment only. Walk-ins are not accepted. [place] will see patients 3 years of age and older. One Wednesday Evening (Monthly: Volunteer Based).  $30 per visit, cash only  [place]  [phone] for adults; Children under age 8, call Graduate Pediatric Dentistry at [phone]. Children aged 18-14, please call [phone] to request a pediatric application.  Dental services are provided in all areas of dental care including fillings, crowns and bridges, complete and partial dentures, implants, gum treatment, root canals, and extractions. Preventive care is also provided. Treatment is provided to both adults and children. Patients are selected via a lottery and there is often a waiting list.   [facility] [address], [place]  [phone] [email or web address]   [facility] [address] [place], [place] [phone], Ext. [phone] Second and Fourth Thursday of each month, opens at 6:30 AM; Clinic ends at 9 AM.  Patients are seen on a first-come first-served basis, and a limited number are seen during each clinic.   [facility]  [address] [place] D[place]er [place], [place] [phone]   Eligibility Requirements You must have lived in [place], [place], or [place] counties for at least the last three months.   You cannot be eligible for state or federal sponsored [employer], including [employer], [place], or [employer].    You generally cannot be eligible for healthcare insurance through your employer.    How to apply: Eligibility screenings are held every Tuesday and Wednesday afternoon from 1:00 pm until 4:00 pm. You do not need an appointment for the interview!  [facility] [address], [place], [place]   [place]  [place]lias[place]Fela Solis Department  [place]  [phone]    Behavioral Health Resources in the Community: Intensive Outpatient Programs Organization         Address  Phone  Notes  [place]. [address] [place], [place] [phone]   [facility] Outpatient [address], [place], [employer]   ADS: Alcohol & Drug Svcs [address], [place], [place]   [place] [address],  [place], [place] or [phone]   Substance Abuse Resources Organization         Address  Phone  Notes  Alcohol and Drug Services  5867999529   [place]  [phone]   [facility] [place]   Chinita Pester  [phone]   Residential & Outpatient Substance Abuse Program  [phone]   Psychological Services Organization         Address  Phone  Notes  [facility]  [place]  [phone]   [place] [address], [place] or [phone]    Mobile Crisis Teams Organization         Address  Phone  Notes  [facility], Mobile Crisis Care Unit  [phone]   [facility]  [address] [place], [place]   [place] [address] [place] [phone]    Self-Help/Support Groups Organization         Address  Phone             Notes  [place]. of [place] -  variety of support groups  [phone] Call  for more information  Narcotics Anonymous (NA), [facility] [address], [employer] [place]  2 meetings at this location   Residential Facilities manager         Address  Phone  Notes  [facility] [place],    [place]  [phone]   [facility]  [address] [place] [phone], [place], [place]   [place], [place] [phone] Admissions: 8am-3pm M-F  Incentives Substance [place] [address]    [place], [place] [phone]   [facility] [address] [place], [place], [place]   [facility] [address],  [place], [place]   Insight Programs - Intensive Outpatient [address], [place], [place]   [facility] ([place].,  [place], [place] [phone] or [phone]   [facility] Services (RTS) [address] [place], [place] Accepts Medicaid  [facility] [address]  [place] [phone] Substance Abuse/Addiction Treatment   [facility] [address] Organization         Address  Phone  Notes  [facility]  [phone]   Domenic Schwab, PhD [address] [place], [place]   [phone] or [phone]   [place], [place] [phone]   [facility] [address] [place], [place] [phone] Insurance/Medicaid/sponsorship through [facility] and Families [address], [address] [place]                                    [place], [place] [phone] [place] [address], [place] [phone]    Dr. Adele Schilder  [phone]   [facility] [place] Dept. 1) [address] [place] 2) [place] 3)  [place] [address], [place]  [phone]  [phone]   [place] [phone] or [phone] (After Hours)

## 2015-03-10 NOTE — ED Provider Notes (Signed)
CSN: [id number]     Arrival date & time 03/10/15  1411 History   First MD Initiated Contact with Patient 03/10/15 1656     Chief Complaint  Patient presents with  . Back Pain  . Emesis    HPI   Caroline Williams is a 54 y.o. female with a PMH of DM, HTN, depression who presents to the ED with back pain, lower extremity pain, abdominal pain, N/V, headache, and confusion. She reports her symptoms started yesterday and have been constant since that time. She denies exacerbating or alleviating factors. She reports low back pain with radiation to her bilateral lower extremities. She denies bowel or bladder incontinence, though states she has had episodes where she had to use the bathroom but could not make it in time. She denies saddle anesthesia, IVDU, anticoagulant use, history of malignancy. She reports left sided abdominal pain and N/V. She denies hematemesis. She reports intermittent constipation and diarrhea. She denies hematochezia or melena. She reports headache over her forehead with associated lightheadedness and dizziness. She denies vision changes. She states she has had difficulty with speech since yesterday and feels more confused than usual. She states this has never happened to her before. She reports subjective fever and chills. She denies chest pain, shortness of breath, dysuria, urgency, frequency.   Past Medical History  Diagnosis Date  . History of knee replacement, total   . Fever   . Breast lump   . Breast discharge   . Breast pain   . Diabetes mellitus   . Thyroid disease   . Asthma   . Arthritis   . Depression   . Hypertension   . Bipolar 1 disorder   . Hypothyroidism   . Sleep apnea   . Graves disease   . Graves disease   . Pneumonia     2015,2014  . Anxiety   . GERD (gastroesophageal reflux disease)    Past Surgical History  Procedure Laterality Date  . Cesarean section    . Breast lumpectomy      right  . Hernia repair    . Appendectomy    . Abdominal  hysterectomy      partial  . Knee surgery      right  . Myomectomy abdominal approach  1989  . Myomectomy abdominal approach  1989  . Cardiac catheterization      no significant CAD, nl LV function by 11/08/06 cath  . Total knee revision  06/21/2012    Procedure: TOTAL KNEE REVISION;  Surgeon: Newt Minion, MD;  Location: [facility];  Service: Orthopedics;  Laterality: Right;  Revision Right Total Knee Arthroplasty  . Gastric roux-en-y N/A 11/25/2014    Procedure: LAPAROSCOPIC ROUX-EN-Y GASTRIC BYPASS WITH UPPER ENDOSCOPY;  Surgeon: Johnathan Hausen, MD;  Location: [facility];  Service: General;  Laterality: N/A;   Family History  Problem Relation Age of Onset  . Cancer Father     colon  . Stroke Father   . Heart disease Father   . Depression Sister    Social History  Substance Use Topics  . Smoking status: Former Smoker -- 0.50 packs/day for 4 years    Types: Cigarettes    Quit date: 09/13/2014  . Smokeless tobacco: Never Used  . Alcohol Use: No   OB History    No data available     Review of Systems  Constitutional: Positive for fever, chills and fatigue.  Eyes: Negative for visual disturbance.  Respiratory: Negative for shortness of  breath.   Cardiovascular: Negative for chest pain.  Gastrointestinal: Positive for nausea, vomiting, abdominal pain, diarrhea and constipation.  Genitourinary: Negative for dysuria, urgency and frequency.  Musculoskeletal: Positive for back pain.  Neurological: Positive for dizziness, speech difficulty, light-headedness and headaches. Negative for syncope, facial asymmetry, weakness and numbness.  Psychiatric/Behavioral: Positive for confusion.  All other systems reviewed and are negative.     Allergies  Aspirin; Sulfa antibiotics; and Other  Home Medications   Prior to Admission medications   Medication Sig Start Date End Date Taking? Authorizing Provider  ADVAIR DISKUS 500-50 MCG/DOSE AEPB Inhale 1 puff into the lungs 2 (two) times daily as  needed (shortness of breath, wheezing).  12/05/13   Historical Provider, MD  albuterol (PROVENTIL HFA;VENTOLIN HFA) 108 (90 BASE) MCG/ACT inhaler Inhale 2 puffs into the lungs every 6 (six) hours as needed for wheezing or shortness of breath. Shortness of breath    Historical Provider, MD  ALPRAZolam Duanne Moron) 1 MG tablet Take 1 mg by mouth 4 (four) times daily.  07/02/14   Historical Provider, MD  citalopram (CELEXA) 20 MG tablet Take 20 mg by mouth daily. 01/21/15   Historical Provider, MD  Cyanocobalamin (B-12 PO) Take 1 tablet by mouth every morning.     Historical Provider, MD  EPINEPHrine 0.3 mg/0.3 mL IJ SOAJ injection Inject 0.3 mg as directed as needed (severe allergic reaction).  01/11/14   Historical Provider, MD  fluticasone (FLONASE) 50 MCG/ACT nasal spray Place 2 sprays into the nose daily as needed (congestion).    Historical Provider, MD  gabapentin (NEURONTIN) 600 MG tablet Take 600 mg by mouth 2 (two) times daily.    Historical Provider, MD  levocetirizine (XYZAL) 5 MG tablet Take 5 mg by mouth daily as needed for allergies.  06/29/14   Historical Provider, MD  levothyroxine (SYNTHROID, LEVOTHROID) 125 MCG tablet Take 125 mcg by mouth every morning.     Historical Provider, MD  Lidocaine 0.5 % GEL Apply 1 application topically 2 (two) times daily as needed (knee pain.).     Historical Provider, MD  magnesium hydroxide (MILK OF MAGNESIA) 400 MG/5ML suspension Take 15 mLs by mouth daily as needed for mild constipation.    Historical Provider, MD  methylphenidate (RITALIN) 20 MG tablet Take 20 mg by mouth 2 (two) times daily with breakfast and lunch.  06/14/13   Historical Provider, MD  morphine (MS CONTIN) 15 MG 12 hr tablet Take 1 tablet (15 mg total) by mouth every 12 (twelve) hours. 11/14/14   Bayard Hugger, NP  Multiple Vitamin (MULTIVITAMIN) tablet Take 1 tablet by mouth every morning.     Historical Provider, MD  nabumetone (RELAFEN) 750 MG tablet Take 750 mg by mouth 2 (two) times daily  as needed for mild pain.  10/04/13   Historical Provider, MD  Oxycodone HCl 10 MG TABS Take 1 tablet (10 mg total) by mouth every 6 (six) hours as needed (for pain). 11/14/14   Bayard Hugger, NP  QUEtiapine (SEROQUEL) 100 MG tablet Take 100 mg by mouth at bedtime.  04/09/14   Historical Provider, MD  REXULTI 2 MG TABS Take 2 mg by mouth at bedtime.  06/18/14   Historical Provider, MD  VOLTAREN 1 % GEL Apply 1 application topically 4 (four) times daily as needed (pain).  01/23/15   Historical Provider, MD  zolpidem (AMBIEN) 10 MG tablet Take 10 mg by mouth at bedtime.  09/07/14   Historical Provider, MD    BP 164/92  mmHg  Pulse 120  Temp(Src) 101.6 F (38.7 C) (Oral)  Resp 18  SpO2 99% Physical Exam  Constitutional: No distress.  Obese female in no acute distress.  HENT:  Head: Normocephalic and atraumatic.  Right Ear: External ear normal.  Left Ear: External ear normal.  Nose: Nose normal.  Mouth/Throat: Oropharynx is clear and moist.  Eyes: Conjunctivae and EOM are normal. Pupils are equal, round, and reactive to light. Right eye exhibits no discharge. Left eye exhibits no discharge. No scleral icterus.  Neck: Normal range of motion. Neck supple. No spinous process tenderness and no muscular tenderness present.  Cardiovascular: Regular rhythm.  Tachycardia present.   Pulmonary/Chest: Effort normal and breath sounds normal. No respiratory distress. She has no wheezes. She has no rales.  Abdominal: Soft. Bowel sounds are normal. She exhibits no distension and no mass. There is tenderness. There is no rebound and no guarding.  Diffuse TTP. No rebound, guarding, or masses.  Musculoskeletal:  Diffuse TTP of thoracic and lumbar spine and paraspinal muscles. No palpable step-off or deformity.  Neurological: She is alert. She has normal strength. No cranial nerve deficit or sensory deficit.  Oriented to person and place. Patient with intermittent speech difficulty.  Skin: Skin is warm and dry.  She is not diaphoretic.  Vitals reviewed.   ED Course  Procedures (including critical care time)  Labs Review Labs Reviewed  LIPASE, BLOOD - Abnormal; Notable for the following:    Lipase 11 (*)    All other components within normal limits  COMPREHENSIVE METABOLIC PANEL - Abnormal; Notable for the following:    Potassium 2.8 (*)    Glucose, Bld 142 (*)    All other components within normal limits  CBC - Abnormal; Notable for the following:    WBC 16.0 (*)    RBC 5.51 (*)    RDW 17.7 (*)    All other components within normal limits  URINALYSIS, ROUTINE W REFLEX MICROSCOPIC (NOT AT [facility]) - Abnormal; Notable for the following:    Color, Urine AMBER (*)    APPearance TURBID (*)    Hgb urine dipstick LARGE (*)    Bilirubin Urine SMALL (*)    Ketones, ur 15 (*)    Protein, ur >300 (*)    Urobilinogen, UA 4.0 (*)    Leukocytes, UA SMALL (*)    All other components within normal limits  URINE MICROSCOPIC-ADD ON - Abnormal; Notable for the following:    Bacteria, UA MANY (*)    All other components within normal limits  CBG MONITORING, ED - Abnormal; Notable for the following:    Glucose-Capillary 141 (*)    All other components within normal limits  CULTURE, BLOOD (ROUTINE X 2)  CULTURE, BLOOD (ROUTINE X 2)  URINE CULTURE  I-STAT TROPOININ, ED  I-STAT CG4 LACTIC ACID, ED  I-STAT CG4 LACTIC ACID, ED    Imaging Review Dg Chest 2 View  03/10/2015   CLINICAL DATA:  Fever today.  EXAM: CHEST  2 VIEW  COMPARISON:  11/25/2014, T5 16  FINDINGS: The cardiomediastinal contours are normal. Pulmonary vasculature is normal. No consolidation, pleural effusion, or pneumothorax. No acute osseous abnormalities are seen.  IMPRESSION: No acute pulmonary process.   Electronically Signed   By: Jeb Levering M.D.   On: 03/10/2015 19:12   Ct Head Wo Contrast  03/10/2015   CLINICAL DATA:  Headache, vomiting, and confusion.  EXAM: CT HEAD WITHOUT CONTRAST  TECHNIQUE: Contiguous axial images were  obtained from the  base of the skull through the vertex without intravenous contrast.  COMPARISON:  12/24/2013  FINDINGS: No mass lesion. No midline shift. No acute hemorrhage or hematoma. No extra-axial fluid collections. No evidence of acute infarction. Brain parenchyma is normal. No significant osseous abnormality.  IMPRESSION: Normal exam.   Electronically Signed   By: Lorriane Shire M.D.   On: 03/10/2015 17:38   US Abdomen Limited  03/10/2015   CLINICAL DATA:  Back pain. Abdominal pain. Onset of symptoms 02/12/2015.  EXAM: US ABDOMEN LIMITED - RIGHT UPPER QUADRANT  COMPARISON:  None.  FINDINGS: Gallbladder:  No gallstones or wall thickening visualized. No sonographic Murphy sign noted.  Common bile duct:  Diameter: 2 mm.  Liver:  No focal lesion identified. Heterogeneous echotexture. Liver is isoechoic when compared to the RIGHT kidney.  IMPRESSION: Normal RIGHT upper quadrant ultrasound.   Electronically Signed   By: Dereck Ligas M.D.   On: 03/10/2015 20:01     I have personally reviewed and evaluated these images and lab results as part of my medical decision-making.   EKG Interpretation   Date/Time:  Monday March 10 2015 17:53:29 EDT Ventricular Rate:  119 PR Interval:  166 QRS Duration: 83 QT Interval:  325 QTC Calculation: 457 R Axis:   -2 Text Interpretation:  Sinus tachycardia Biatrial enlargement Low voltage,  precordial leads Confirmed by [facility] MD, Corene Cornea [phone]) on 03/10/2015  6:30:26 PM      MDM   Final diagnoses:  Confusion  Pyelonephritis    54 year old female presents with back pain, lower extremity pain, abdominal pain, N/V, headache, and confusion, which started yesterday. Denies bowel or bladder incontinence, saddle anesthesia, IVDU, anticoagulant use, history of malignancy. Reports intermittent constipation and diarrhea, denies hematochezia or melena. Reports headache over her forehead with associated lightheadedness and dizziness. Denies vision changes.  States she has had difficulty with speech since yesterday and feels more confused than usual. Reports subjective fever and chills. Denies chest pain, shortness of breath, dysuria, urgency, frequency.  Patient febrile to 101.6. Given tylenol with subsequent improvement. Patient tachycardic in the ED. Given 1L NS bolus x 2. No hypotension. Heart regular rhythm. Lungs clear to auscultation bilaterally. Abdomen with diffuse TTP. No rebound, guarding, or masses. Mild TTP of thoracic and lumbar spine and paraspinal muscles. No palpable step-off or deformity. No lower extremity edema. Patient appears to have intermittent difficulty with speech, and while speaking with her, she is occasionally unable to produce certain words. Strength and sensation intact. CNs III-XII grossly intact. No nuchal rigidity.  CBC with leukocytosis of 16, no anemia. CMP with potassium 2.8, repleted in the ED with IV and oral potassium. Lipase unremarkable. Lactic acid within normal limits. EKG no acute ischemia. Troponin negative x 1.   Head CT obtained given patient's N/V, headache, and confusion. Demonstrates no mass lesion, no midline shift, no hemorrhage or hematoma, no extra-axial fluid collection, no acute infarction, normal brain parenchyma. Chest x-ray negative for acute pulmonary process. RUQ  [place] with no gallstones or wall thickening, no focal lesion.  UA with leukocytes, 11-20 WBC and many bacteria on microscopic, most likely consistent with pyelonephritis. Urine culture obtained. Patient given ceftriaxone in the ED.  On repeat exam, patient is well appearing and non-toxic. She reports significant improvement in back pain, lower extremity pain, abdominal pain, and headache. She states she no longer feels confused, and seems to be back to her baseline. She is alert and oriented x 4 and has a normal neuro exam with  no focal deficit. No nuchal rigidity. Speech difficulty resolved, speech fluent. Feel patient is stable for  discharge at this time with close PCP follow-up. Will give keflex for pyelonephritis and zofran for nausea. Return precautions discussed.  Patient discussed with and seen by Dr. Dayna Barker.    Marella Chimes, PA-C 03/11/15 1218  Merrily Pew, MD 03/13/15 [phone]

## 2015-03-10 NOTE — ED Notes (Signed)
Pt currently in a hall bed, will have to wait for private room to complete EKG

## 2015-03-10 NOTE — ED Notes (Signed)
Pt reminded of the need for urine, however pt states she is still unable to provide one at this time.

## 2015-03-11 ENCOUNTER — Encounter (HOSPITAL_COMMUNITY): Payer: Self-pay | Admitting: Emergency Medicine

## 2015-03-11 ENCOUNTER — Telehealth (HOSPITAL_BASED_OUTPATIENT_CLINIC_OR_DEPARTMENT_OTHER): Payer: Self-pay | Admitting: Emergency Medicine

## 2015-03-11 ENCOUNTER — Inpatient Hospital Stay (HOSPITAL_COMMUNITY)
Admission: EM | Admit: 2015-03-11 | Discharge: 2015-03-13 | DRG: 872 | Disposition: A | Discharge status: Home / Self Care | Payer: Medicare Other | Attending: Internal Medicine | Admitting: Internal Medicine

## 2015-03-11 DIAGNOSIS — G4730 Sleep apnea, unspecified: Secondary | ICD-10-CM | POA: Diagnosis present

## 2015-03-11 DIAGNOSIS — M25561 Pain in right knee: Secondary | ICD-10-CM | POA: Diagnosis present

## 2015-03-11 DIAGNOSIS — E871 Hypo-osmolality and hyponatremia: Secondary | ICD-10-CM | POA: Diagnosis present

## 2015-03-11 DIAGNOSIS — F17210 Nicotine dependence, cigarettes, uncomplicated: Secondary | ICD-10-CM | POA: Diagnosis present

## 2015-03-11 DIAGNOSIS — Z6841 Body Mass Index (BMI) 40.0 and over, adult: Secondary | ICD-10-CM

## 2015-03-11 DIAGNOSIS — F319 Bipolar disorder, unspecified: Secondary | ICD-10-CM | POA: Diagnosis present

## 2015-03-11 DIAGNOSIS — A4150 Gram-negative sepsis, unspecified: Secondary | ICD-10-CM | POA: Diagnosis present

## 2015-03-11 DIAGNOSIS — K219 Gastro-esophageal reflux disease without esophagitis: Secondary | ICD-10-CM | POA: Diagnosis present

## 2015-03-11 DIAGNOSIS — E861 Hypovolemia: Secondary | ICD-10-CM | POA: Diagnosis present

## 2015-03-11 DIAGNOSIS — D72829 Elevated white blood cell count, unspecified: Secondary | ICD-10-CM | POA: Diagnosis present

## 2015-03-11 DIAGNOSIS — G8929 Other chronic pain: Secondary | ICD-10-CM | POA: Diagnosis present

## 2015-03-11 DIAGNOSIS — N390 Urinary tract infection, site not specified: Secondary | ICD-10-CM | POA: Diagnosis present

## 2015-03-11 DIAGNOSIS — Z888 Allergy status to other drugs, medicaments and biological substances status: Secondary | ICD-10-CM | POA: Diagnosis not present

## 2015-03-11 DIAGNOSIS — Z9884 Bariatric surgery status: Secondary | ICD-10-CM

## 2015-03-11 DIAGNOSIS — R441 Visual hallucinations: Secondary | ICD-10-CM | POA: Diagnosis present

## 2015-03-11 DIAGNOSIS — Z79899 Other long term (current) drug therapy: Secondary | ICD-10-CM

## 2015-03-11 DIAGNOSIS — N12 Tubulo-interstitial nephritis, not specified as acute or chronic: Secondary | ICD-10-CM | POA: Diagnosis present

## 2015-03-11 DIAGNOSIS — Z886 Allergy status to analgesic agent status: Secondary | ICD-10-CM | POA: Diagnosis not present

## 2015-03-11 DIAGNOSIS — R7881 Bacteremia: Secondary | ICD-10-CM | POA: Diagnosis not present

## 2015-03-11 DIAGNOSIS — E876 Hypokalemia: Secondary | ICD-10-CM | POA: Diagnosis present

## 2015-03-11 DIAGNOSIS — J45909 Unspecified asthma, uncomplicated: Secondary | ICD-10-CM | POA: Diagnosis present

## 2015-03-11 DIAGNOSIS — E1140 Type 2 diabetes mellitus with diabetic neuropathy, unspecified: Secondary | ICD-10-CM | POA: Diagnosis present

## 2015-03-11 DIAGNOSIS — F410 Panic disorder [episodic paroxysmal anxiety] without agoraphobia: Secondary | ICD-10-CM | POA: Diagnosis present

## 2015-03-11 DIAGNOSIS — M545 Low back pain: Secondary | ICD-10-CM | POA: Diagnosis present

## 2015-03-11 DIAGNOSIS — D696 Thrombocytopenia, unspecified: Secondary | ICD-10-CM | POA: Diagnosis present

## 2015-03-11 DIAGNOSIS — B9620 Unspecified Escherichia coli [E. coli] as the cause of diseases classified elsewhere: Secondary | ICD-10-CM | POA: Insufficient documentation

## 2015-03-11 DIAGNOSIS — Z96651 Presence of right artificial knee joint: Secondary | ICD-10-CM | POA: Diagnosis present

## 2015-03-11 DIAGNOSIS — F259 Schizoaffective disorder, unspecified: Secondary | ICD-10-CM | POA: Diagnosis present

## 2015-03-11 DIAGNOSIS — A4151 Sepsis due to Escherichia coli [E. coli]: Secondary | ICD-10-CM | POA: Diagnosis present

## 2015-03-11 DIAGNOSIS — E039 Hypothyroidism, unspecified: Secondary | ICD-10-CM | POA: Diagnosis present

## 2015-03-11 DIAGNOSIS — M1990 Unspecified osteoarthritis, unspecified site: Secondary | ICD-10-CM | POA: Diagnosis present

## 2015-03-11 DIAGNOSIS — A419 Sepsis, unspecified organism: Secondary | ICD-10-CM | POA: Diagnosis not present

## 2015-03-11 DIAGNOSIS — Z882 Allergy status to sulfonamides status: Secondary | ICD-10-CM

## 2015-03-11 DIAGNOSIS — I10 Essential (primary) hypertension: Secondary | ICD-10-CM | POA: Diagnosis present

## 2015-03-11 DIAGNOSIS — F19951 Other psychoactive substance use, unspecified with psychoactive substance-induced psychotic disorder with hallucinations: Secondary | ICD-10-CM | POA: Diagnosis present

## 2015-03-11 DIAGNOSIS — F909 Attention-deficit hyperactivity disorder, unspecified type: Secondary | ICD-10-CM | POA: Diagnosis present

## 2015-03-11 DIAGNOSIS — Z79891 Long term (current) use of opiate analgesic: Secondary | ICD-10-CM

## 2015-03-11 DIAGNOSIS — J449 Chronic obstructive pulmonary disease, unspecified: Secondary | ICD-10-CM | POA: Diagnosis present

## 2015-03-11 LAB — CBC WITH DIFFERENTIAL/PLATELET
Basophils Absolute: 0 10*3/uL (ref 0.0–0.1)
Basophils Relative: 0 %
Eosinophils Absolute: 0 10*3/uL (ref 0.0–0.7)
Eosinophils Relative: 0 %
HCT: 39.9 % (ref 36.0–46.0)
Hemoglobin: 13.2 g/dL (ref 12.0–15.0)
Lymphocytes Relative: 11 %
Lymphs Abs: 0.9 10*3/uL (ref 0.7–4.0)
MCH: 26.1 pg (ref 26.0–34.0)
MCHC: 33.1 g/dL (ref 30.0–36.0)
MCV: 78.9 fL (ref 78.0–100.0)
Monocytes Absolute: 0.5 10*3/uL (ref 0.1–1.0)
Monocytes Relative: 6 %
Neutro Abs: 6.9 10*3/uL (ref 1.7–7.7)
Neutrophils Relative %: 83 %
Platelets: 140 10*3/uL — ABNORMAL LOW (ref 150–400)
RBC: 5.06 MIL/uL (ref 3.87–5.11)
RDW: 18.1 % — ABNORMAL HIGH (ref 11.5–15.5)
WBC: 8.3 10*3/uL (ref 4.0–10.5)

## 2015-03-11 LAB — BASIC METABOLIC PANEL
Anion gap: 6 (ref 5–15)
BUN: 9 mg/dL (ref 6–20)
CO2: 24 mmol/L (ref 22–32)
Calcium: 8.6 mg/dL — ABNORMAL LOW (ref 8.9–10.3)
Chloride: 104 mmol/L (ref 101–111)
Creatinine, Ser: 0.97 mg/dL (ref 0.44–1.00)
GFR calc Af Amer: >60 mL/min (ref >60)
GFR calc non Af Amer: >60 mL/min (ref >60)
Glucose, Bld: 108 mg/dL — ABNORMAL HIGH (ref 65–99)
Potassium: 3.6 mmol/L (ref 3.5–5.1)
Sodium: 134 mmol/L — ABNORMAL LOW (ref 135–145)

## 2015-03-11 MED ORDER — SODIUM CHLORIDE 0.9 % IV BOLUS (SEPSIS)
1000.0000 mL | Freq: Once | INTRAVENOUS | Status: AC
  Administered 2015-03-11: 1000 mL via INTRAVENOUS

## 2015-03-11 MED ORDER — DEXTROSE 5 % IV SOLN
1.0000 g | Freq: Once | INTRAVENOUS | Status: AC
  Administered 2015-03-11: 1 g via INTRAVENOUS
  Filled 2015-03-11: qty 10

## 2015-03-11 MED ORDER — ACETAMINOPHEN 325 MG PO TABS
650.0000 mg | ORAL_TABLET | Freq: Four times a day (QID) | ORAL | Status: DC | PRN
  Administered 2015-03-11 – 2015-03-12 (×2): 650 mg via ORAL
  Filled 2015-03-11 (×2): qty 2

## 2015-03-11 NOTE — ED Notes (Signed)
Food given to pt.  

## 2015-03-11 NOTE — Telephone Encounter (Signed)
Post ED Visit - Positive Culture Follow-up: Successful Patient Follow-Up  Culture assessed and recommendations reviewed by: []  Heide Guile, Pharm.D., BCPS-AQ ID []  Alycia Rossetti, Pharm.D., BCPS []  [place], Pharm.D., BCPS, AAHIVP []  Legrand Como, Pharm.D., BCPS, AAHIVP []  [place], Pharm.D. []  Cassie Nicole Kindred, [place].D.  Positive blood culture gram negative rods anaerobic bottle x 1, Irena Cords PA notified and gave T. O. To call patient to return to ED for reeval and possible admission  []  Patient discharged without antimicrobial prescription and treatment is now indicated []  Organism is resistant to prescribed ED discharge antimicrobial [x]  Patient with positive blood cultures  Changes discussed with ED provider: Dalia Heading PA New antibiotic prescription n/a Patient needs to return to ED for reeval and possible admit for IV antibiotics, expressed to pt possible severity of not returning  Contacted patient,03/11/15 [place], [place] 03/11/2015, 1:07 PM

## 2015-03-11 NOTE — ED Notes (Signed)
Pt states she was here yesterday and dx with a kidney infection but was called back today because she had positive blood cultures. States she has started taking the oral antibiotics. Febrile. Alert and oriented.

## 2015-03-11 NOTE — H&P (Signed)
History and Physical  Caroline Williams  KJZ:[id number]  DOB: 07/24/1960  DOA: 03/11/2015  Referring physician: Leo Grosser, MD PCP: Antony Blackbird, MD   Chief Complaint: "I got called back because my blood culture was positive."  HPI: Caroline Williams is a 53 y.o. female with a past medical history significant for hypothyroidism, morbid obesity s/p gastric bypass, chronic pain from osteoarthritis on opioids, resolved who presents with positive blood culture and UTI.  The patient was in her usual state of health until 4 days ago when she started to develop vomiting, lower back pain, and urinary frequency. She felt so bad that she presented to the emergency room last nightwhen she was tachycardic, febrile and had a leukocytosis 16K/ul and evidence of urinary tract infection. Blood cultures were drawn, a lactic acid level was normal, and the patient was discharged home with Keflex.  Today she was called because gram-negative rods were grown and one of 2 blood cultures. Since yesterday she reports that her emesis is slightly better, but her malaise and pain are the same.  In the ED blood cultures were redrawn, and she was started on intravenous ceftriaxone and admitted to [facility].     Review of Systems:  Patient seen 11:24 PM on 03/11/2015. Pt complains of urinary frequency, nausea, lower back pain, headache, rib pain from vomiting. She notes she had some confusion yesterday but that this is resolved. Pt denies any neck pain, chest pain, flank pain, dysuria.  Twelve systems were reviewed and were negative except as noted above in the history of present illness.   Past medical history: 1. Type 2 diabetes, resolved after gastric bypass  2. Hypertension, resolved after gastric bypass 3. History of bipolar disorder on Seroquel 4. History of gastric bypass 5. Hypothyroidism status post ablation for Graves' 6. Chronic pain from osteoarthritis, on chronic opioids  Past Surgical History  Procedure  Laterality Date  . Cesarean section    . Breast lumpectomy      right  . Hernia repair    . Appendectomy    . Abdominal hysterectomy      partial  . Knee surgery      right  . Myomectomy abdominal approach  1989  . Myomectomy abdominal approach  1989  . Cardiac catheterization      no significant CAD, nl LV function by 11/08/06 cath  . Total knee revision  06/21/2012    Procedure: TOTAL KNEE REVISION;  Surgeon: Newt Minion, MD;  Location: [place];  Service: Orthopedics;  Laterality: Right;  Revision Right Total Knee Arthroplasty  . Gastric roux-en-y N/A 11/25/2014    Procedure: LAPAROSCOPIC ROUX-EN-Y GASTRIC BYPASS WITH UPPER ENDOSCOPY;  Surgeon: Johnathan Hausen, MD;  Location: [facility];  Service: General;  Laterality: N/A;  The above surgical history was reviewed.  Social History: Patient lives with her boyfriend in [place]. 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 She is an active smoker.   Allergies  Allergen Reactions  . Aspirin Anaphylaxis  . Sulfa Antibiotics Anaphylaxis  . Other     No blood products.  Patient did  Request that only albumin or albumin-containing products may be administered    Family History  Problem Relation Age of Onset  . Cancer Father     colon  . Stroke Father   . Heart disease Father   . Diabetes Father   . Hypertension Father   . Depression Sister   . Hypertension Mother     Prior  to Admission medications   Medication Sig Start Date End Date Taking? Authorizing Provider  ADVAIR DISKUS 500-50 MCG/DOSE AEPB Inhale 1 puff into the lungs 2 (two) times daily as needed (shortness of breath, wheezing).  12/05/13  Yes Historical Provider, MD  albuterol (PROVENTIL HFA;VENTOLIN HFA) 108 (90 BASE) MCG/ACT inhaler Inhale 2 puffs into the lungs every 6 (six) hours as needed for wheezing or shortness of breath. Shortness of breath   Yes Historical Provider, MD  ALPRAZolam (XANAX) 1 MG tablet Take 1 mg by mouth 4 (four) times daily.  07/02/14  Yes  Historical Provider, MD  citalopram (CELEXA) 20 MG tablet Take 20 mg by mouth daily. 01/21/15  Yes Historical Provider, MD  Cyanocobalamin (B-12 PO) Take 1 tablet by mouth every morning.    Yes Historical Provider, MD  EPINEPHrine 0.3 mg/0.3 mL IJ SOAJ injection Inject 0.3 mg as directed as needed (severe allergic reaction).  01/11/14  Yes Historical Provider, MD  fluticasone (FLONASE) 50 MCG/ACT nasal spray Place 2 sprays into the nose daily as needed (congestion).   Yes Historical Provider, MD  levocetirizine (XYZAL) 5 MG tablet Take 5 mg by mouth daily as needed for allergies.  06/29/14  Yes Historical Provider, MD  levothyroxine (SYNTHROID, LEVOTHROID) 125 MCG tablet Take 125 mcg by mouth every morning.    Yes Historical Provider, MD  Lidocaine 0.5 % GEL Apply 1 application topically 2 (two) times daily as needed (knee pain.).    Yes Historical Provider, MD  magnesium hydroxide (MILK OF MAGNESIA) 400 MG/5ML suspension Take 15 mLs by mouth daily as needed for mild constipation.   Yes Historical Provider, MD  methylphenidate (RITALIN) 20 MG tablet Take 20 mg by mouth 2 (two) times daily with breakfast and lunch.  06/14/13  Yes Historical Provider, MD  morphine (MS CONTIN) 15 MG 12 hr tablet Take 1 tablet (15 mg total) by mouth every 12 (twelve) hours. 11/14/14  Yes Bayard Hugger, NP  Multiple Vitamin (MULTIVITAMIN) tablet Take 1 tablet by mouth every morning.    Yes Historical Provider, MD  nabumetone (RELAFEN) 750 MG tablet Take 750 mg by mouth 2 (two) times daily as needed for mild pain.  10/04/13  Yes Historical Provider, MD  Oxycodone HCl 10 MG TABS Take 1 tablet (10 mg total) by mouth every 6 (six) hours as needed (for pain). 11/14/14  Yes Bayard Hugger, NP  QUEtiapine (SEROQUEL) 100 MG tablet Take 100 mg by mouth at bedtime.  04/09/14  Yes Historical Provider, MD  REXULTI 2 MG TABS Take 2 mg by mouth at bedtime.  06/18/14  Yes Historical Provider, MD  VOLTAREN 1 % GEL Apply 1 application topically 4  (four) times daily as needed (pain).  01/23/15  Yes Historical Provider, MD  zolpidem (AMBIEN) 10 MG tablet Take 10 mg by mouth at bedtime.  09/07/14  Yes Historical Provider, MD  gabapentin (NEURONTIN) 600 MG tablet Take 600 mg by mouth 2 (two) times daily.    Historical Provider, MD    Physical Exam: BP 131/73 mmHg  Pulse 89  Temp(Src) 100.1 F (37.8 C) (Oral)  Resp 20  Ht 5\' 5"  (1.651 m)  Wt 123.832 kg (273 lb)  BMI 45.43 kg/m2  SpO2 97% General: Adult female, alert and in no obvious distress.  Responds appropriately to questions.  Eye contact, dress and hygiene appropriate. HEENT: Head normal.  Corneas clear, conjunctivae and sclerae normal without injection or icterus, lids and lashes normal.  Pupils miotic but ERRL and EOMI.  Nose normal.  OP moist without erythema, exudates, cobblestoning, or ulcers.  No airway deformities.  Neck supple. No thyromegaly.  Lymph: No cervical lymphadenopathy. Skin: Warm and moist.   Cardiac: tachycardic, regular, nl S1-S2, no murmurs appreciated.  Capillary refill is less than 2 seconds.  JVP not appreciated.  No LE edema.  Radial and DP pulses 3+ and symmetric. Respiratory: Normal respiratory rate and rhythm.  Diffuse bilateral inspiratory and expiratory wheezes. Abdomen: BS present.  tenderness to palpation bilaterally along the costal muscles.  No guarding. No rebound. Back:No vertebral tenderness, no CVA tenderness. Neuro: Sensorium intact.  Cranial nerves grossly intact.  Speech is fluent.  Naming is grossly intact, and the patient's recall, recent and remote, as well as general fund of knowledge seem within normal limits.  Muscle bulk normal.    Moves all extremities equally and with normal coordination.    Psych: Appropriate affect.  Normal rate and rhythm of speech.  Thought content appropriate, and thought process linear.  No evidence of aural or visual hallucinations or delusions. Attention and concentration are normal.           Labs on  Admission:  The metabolic panel is notable for mild hyponatremia, normal potassium, normal bicarbonate, normal serum sodium. Urinalysis shows proteinuria, moderate white blood cells and red blood cells. The complete blood count is notable for resolution of leukocytosis with a white blood cell count [email or web address] thrombocytopenia. No evidence of anemia.  Blood cultures 03/10/2015:1 of 2 blood cultures positive with gram-negative rods Urine culture 03/10/2015: Pending  Blood cultures 03/11/2015: Pending Urine culture 03/11/2015: Pending   Radiological Exams on Admission: Personally reviewed: Dg Chest 2 View 03/10/2015  No parenchymal opacities.    Ct Head Wo Contrast 03/10/2015   CLINICAL DATA:  Headache, vomiting, and confusion.  IMPRESSION: Normal exam.    US Abdomen Limited 03/10/2015    FINDINGS: Gallbladder:  No gallstones or wall thickening visualized. No sonographic Murphy sign noted.  Common bile duct:  Diameter: 2 mm.  Liver:  No focal lesion identified. Heterogeneous echotexture. Liver is isoechoic when compared to the RIGHT kidney.   IMPRESSION: Normal RIGHT upper quadrant ultrasound.       EKG: Independently reviewed. From yesterday, sinus tachycardia, rate 119 bpm.  Assessment/Plan Principal Problem:   Sepsis secondary to UTI Active Problems:   Morbid obesity   Diabetes mellitus with neuropathy   Chronic pain of right knee   Gram-negative bacteremia    1. Sepsis secondary to UTI The patient was septic yesterday when she presented, and blood cultures have subsequently grown a gram-negative rod in 1 out of 2 anaerobic bottles. Repeat blood cultures have been drawn. She has been on one day of oral cephalexin therapy, and reports that she is somewhat better. Since she seems to be improving I will not give a full 30 ml/kg bolus. -Additional 1L bolus now, then normal saline at 100 cc per hour -Ceftriaxone 2 g daily -Follow-up blood cultures from yesterday,  -Follow  up urine cultures -Check lactic acid once and follow-up per protocol  2. Hyponatremia: Likely hypovolemic, in setting of sepsis. -Rehydration, and repeat BMP  3. Thrombocytopenia: Mild.  New.  Suspect related to sepsis.  4. Chronic pain:  Stable. -Continue home gabapentin -Continue home citalopram -Continue home diclofenac gel -Continue home morphine 15 mg twice daily -Continue home oxycodone when necessary  5. Other chronic medical conditions:  -hypothyroidism: Stable. Continue home levothyroxine -COPD: Stable. Continue home inhaler -ADD and bipolar disorder: Stable. Continue home Ritalin  and quetiapine and alprazolam.   DVT PPx: Lovenox Diet: regular Code Status: Full  Disposition Plan:  The appropriate admission status for this patient is INPATIENT. Inpatient status is judged to be reasonable and necessary in order to provide the required intensity of service to ensure the patient's safety. The patient's presenting symptoms, physical exam findings, and initial radiographic and laboratory data in the context of their chronic comorbidities is felt to place them at high risk for further clinical deterioration. Furthermore, it is not anticipated that the patient will be medically stable for discharge from the hospital within 2 midnights of admission. The following factors support the admission status of inpatient.   A. The patient's presenting symptoms include fever, tachycardia, confusion yesterday, weakness. B. The worrisome physical exam findings include tachycardia, fever, back pain. C. The initial radiographic and laboratory data are worrisome because of thrombocytopenia, hyponatremia, bacteremia. D. The chronic co-morbidities include history of type 2 diabetes. E. Patient requires inpatient status due to high intensity of service, high risk for further deterioration and high frequency of surveillance required. F. I certify that at the point of admission it is my clinical  judgment that the patient will require inpatient hospital care spanning beyond 2 midnights from the point of admission.     Edwin Dada [facility] Pager [phone]

## 2015-03-11 NOTE — ED Provider Notes (Signed)
CSN: [id number]     Arrival date & time 03/11/15  1837 History   First MD Initiated Contact with Patient 03/11/15 2047     Chief Complaint  Patient presents with  . Abnormal Lab     (Consider location/radiation/quality/duration/timing/severity/associated sxs/prior Treatment) Patient is a 54 y.o. female presenting with flank pain. The history is provided by the patient.  Flank Pain This is a new problem. The current episode started 2 days ago. The problem occurs constantly. The problem has not changed since onset.Pertinent negatives include no chest pain. Nothing aggravates the symptoms. Nothing relieves the symptoms. Treatments tried: keflex. The treatment provided no relief.    Past Medical History  Diagnosis Date  . History of knee replacement, total   . Fever   . Breast lump   . Breast discharge   . Breast pain   . Diabetes mellitus   . Thyroid disease   . Asthma   . Arthritis   . Depression   . Hypertension   . Bipolar 1 disorder   . Hypothyroidism   . Sleep apnea   . Graves disease   . Graves disease   . Pneumonia     2015,2014  . Anxiety   . GERD (gastroesophageal reflux disease)    Past Surgical History  Procedure Laterality Date  . Cesarean section    . Breast lumpectomy      right  . Hernia repair    . Appendectomy    . Abdominal hysterectomy      partial  . Knee surgery      right  . Myomectomy abdominal approach  1989  . Myomectomy abdominal approach  1989  . Cardiac catheterization      no significant CAD, nl LV function by 11/08/06 cath  . Total knee revision  06/21/2012    Procedure: TOTAL KNEE REVISION;  Surgeon: Newt Minion, MD;  Location: [place];  Service: Orthopedics;  Laterality: Right;  Revision Right Total Knee Arthroplasty  . Gastric roux-en-y N/A 11/25/2014    Procedure: LAPAROSCOPIC ROUX-EN-Y GASTRIC BYPASS WITH UPPER ENDOSCOPY;  Surgeon: Johnathan Hausen, MD;  Location: [facility];  Service: General;  Laterality: N/A;   Family History   Problem Relation Age of Onset  . Cancer Father     colon  . Stroke Father   . Heart disease Father   . Diabetes Father   . Hypertension Father   . Depression Sister   . Hypertension Mother    Social History  Substance Use Topics  . Smoking status: Former Smoker -- 0.50 packs/day for 4 years    Types: Cigarettes    Quit date: 09/13/2014  . Smokeless tobacco: Never Used  . Alcohol Use: No   OB History    No data available     Review of Systems  Constitutional: Positive for fever and fatigue.  Cardiovascular: Negative for chest pain.  Gastrointestinal: Positive for vomiting.  Genitourinary: Positive for flank pain.  All other systems reviewed and are negative.     Allergies  Aspirin; Sulfa antibiotics; and Other  Home Medications   Prior to Admission medications   Medication Sig Start Date End Date Taking? Authorizing Provider  ADVAIR DISKUS 500-50 MCG/DOSE AEPB Inhale 1 puff into the lungs 2 (two) times daily as needed (shortness of breath, wheezing).  12/05/13  Yes Historical Provider, MD  albuterol (PROVENTIL HFA;VENTOLIN HFA) 108 (90 BASE) MCG/ACT inhaler Inhale 2 puffs into the lungs every 6 (six) hours as needed for wheezing or shortness  of breath. Shortness of breath   Yes Historical Provider, MD  ALPRAZolam (XANAX) 1 MG tablet Take 1 mg by mouth 4 (four) times daily.  07/02/14  Yes Historical Provider, MD  citalopram (CELEXA) 20 MG tablet Take 20 mg by mouth daily. 01/21/15  Yes Historical Provider, MD  Cyanocobalamin (B-12 PO) Take 1 tablet by mouth every morning.    Yes Historical Provider, MD  EPINEPHrine 0.3 mg/0.3 mL IJ SOAJ injection Inject 0.3 mg as directed as needed (severe allergic reaction).  01/11/14  Yes Historical Provider, MD  fluticasone (FLONASE) 50 MCG/ACT nasal spray Place 2 sprays into the nose daily as needed (congestion).   Yes Historical Provider, MD  levocetirizine (XYZAL) 5 MG tablet Take 5 mg by mouth daily as needed for allergies.  06/29/14   Yes Historical Provider, MD  levothyroxine (SYNTHROID, LEVOTHROID) 125 MCG tablet Take 125 mcg by mouth every morning.    Yes Historical Provider, MD  Lidocaine 0.5 % GEL Apply 1 application topically 2 (two) times daily as needed (knee pain.).    Yes Historical Provider, MD  magnesium hydroxide (MILK OF MAGNESIA) 400 MG/5ML suspension Take 15 mLs by mouth daily as needed for mild constipation.   Yes Historical Provider, MD  methylphenidate (RITALIN) 20 MG tablet Take 20 mg by mouth 2 (two) times daily with breakfast and lunch.  06/14/13  Yes Historical Provider, MD  morphine (MS CONTIN) 15 MG 12 hr tablet Take 1 tablet (15 mg total) by mouth every 12 (twelve) hours. 11/14/14  Yes Bayard Hugger, NP  Multiple Vitamin (MULTIVITAMIN) tablet Take 1 tablet by mouth every morning.    Yes Historical Provider, MD  nabumetone (RELAFEN) 750 MG tablet Take 750 mg by mouth 2 (two) times daily as needed for mild pain.  10/04/13  Yes Historical Provider, MD  Oxycodone HCl 10 MG TABS Take 1 tablet (10 mg total) by mouth every 6 (six) hours as needed (for pain). 11/14/14  Yes Bayard Hugger, NP  QUEtiapine (SEROQUEL) 100 MG tablet Take 100 mg by mouth at bedtime.  04/09/14  Yes Historical Provider, MD  REXULTI 2 MG TABS Take 2 mg by mouth at bedtime.  06/18/14  Yes Historical Provider, MD  VOLTAREN 1 % GEL Apply 1 application topically 4 (four) times daily as needed (pain).  01/23/15  Yes Historical Provider, MD  zolpidem (AMBIEN) 10 MG tablet Take 10 mg by mouth at bedtime.  09/07/14  Yes Historical Provider, MD  gabapentin (NEURONTIN) 600 MG tablet Take 600 mg by mouth 2 (two) times daily.    Historical Provider, MD   BP 140/86 mmHg  Pulse 99  Temp(Src) 98.7 F (37.1 C) (Oral)  Resp 20  Ht 5\' 5"  (1.651 m)  Wt 273 lb (123.832 kg)  BMI 45.43 kg/m2  SpO2 99% Physical Exam  Constitutional: She is oriented to person, place, and time. She appears well-developed and well-nourished. No distress.  HENT:  Head:  Normocephalic.  Eyes: Conjunctivae are normal.  Neck: Neck supple. No tracheal deviation present.  Cardiovascular: Normal rate, regular rhythm and normal heart sounds.   Pulmonary/Chest: Effort normal and breath sounds normal. No respiratory distress.  Abdominal: Soft. She exhibits no distension. There is no tenderness. There is CVA tenderness (right).  Neurological: She is alert and oriented to person, place, and time.  Skin: Skin is warm and dry.  Psychiatric: She has a normal mood and affect.    ED Course  Procedures (including critical care time) Labs Review Labs Reviewed  CBC WITH DIFFERENTIAL/PLATELET - Abnormal; Notable for the following:    RDW 18.1 (*)    Platelets 140 (*)    All other components within normal limits  BASIC METABOLIC PANEL - Abnormal; Notable for the following:    Sodium 134 (*)    Glucose, Bld 108 (*)    Calcium 8.6 (*)    All other components within normal limits  URINALYSIS, ROUTINE W REFLEX MICROSCOPIC (NOT AT [facility]) - Abnormal; Notable for the following:    Color, Urine ORANGE (*)    APPearance CLOUDY (*)    Hgb urine dipstick LARGE (*)    Bilirubin Urine MODERATE (*)    Ketones, ur 15 (*)    Protein, ur 100 (*)    Urobilinogen, UA 4.0 (*)    Leukocytes, UA SMALL (*)    All other components within normal limits  BASIC METABOLIC PANEL - Abnormal; Notable for the following:    Potassium 2.4 (*)    Glucose, Bld 158 (*)    Calcium 8.1 (*)    All other components within normal limits  CBC - Abnormal; Notable for the following:    Hemoglobin 11.3 (*)    HCT 34.3 (*)    RDW 18.1 (*)    Platelets 143 (*)    All other components within normal limits  URINE MICROSCOPIC-ADD ON - Abnormal; Notable for the following:    Squamous Epithelial / LPF MANY (*)    Bacteria, UA FEW (*)    Casts WBC CAST (*)    All other components within normal limits  BASIC METABOLIC PANEL - Abnormal; Notable for the following:    Potassium 2.9 (*)    Glucose, Bld 116 (*)     Calcium 8.3 (*)    All other components within normal limits  CULTURE, BLOOD (ROUTINE X 2)  CULTURE, BLOOD (ROUTINE X 2)  URINE CULTURE  LACTIC ACID, PLASMA  CREATININE, SERUM  MAGNESIUM  HEMOGLOBIN A1C    Imaging Review Dg Chest 2 View  03/10/2015   CLINICAL DATA:  Fever today.  EXAM: CHEST  2 VIEW  COMPARISON:  11/25/2014, T5 16  FINDINGS: The cardiomediastinal contours are normal. Pulmonary vasculature is normal. No consolidation, pleural effusion, or pneumothorax. No acute osseous abnormalities are seen.  IMPRESSION: No acute pulmonary process.   Electronically Signed   By: Jeb Levering M.D.   On: 03/10/2015 19:12   Ct Head Wo Contrast  03/10/2015   CLINICAL DATA:  Headache, vomiting, and confusion.  EXAM: CT HEAD WITHOUT CONTRAST  TECHNIQUE: Contiguous axial images were obtained from the base of the skull through the vertex without intravenous contrast.  COMPARISON:  12/24/2013  FINDINGS: No mass lesion. No midline shift. No acute hemorrhage or hematoma. No extra-axial fluid collections. No evidence of acute infarction. Brain parenchyma is normal. No significant osseous abnormality.  IMPRESSION: Normal exam.   Electronically Signed   By: Lorriane Shire M.D.   On: 03/10/2015 17:38   US Abdomen Limited  03/10/2015   CLINICAL DATA:  Back pain. Abdominal pain. Onset of symptoms 02/12/2015.  EXAM: US ABDOMEN LIMITED - RIGHT UPPER QUADRANT  COMPARISON:  None.  FINDINGS: Gallbladder:  No gallstones or wall thickening visualized. No sonographic Murphy sign noted.  Common bile duct:  Diameter: 2 mm.  Liver:  No focal lesion identified. Heterogeneous echotexture. Liver is isoechoic when compared to the RIGHT kidney.  IMPRESSION: Normal RIGHT upper quadrant ultrasound.   Electronically Signed   By: Arvilla Market.D.  On: 03/10/2015 20:01   I have personally reviewed and evaluated these images and lab results as part of my medical decision-making.   EKG Interpretation None      MDM    Final diagnoses:  Bacteremia due to Gram-negative bacteria  Pyelonephritis  Sepsis, due to unspecified organism    54 y.o. female presents with positive blood culture and GNR bacteremia in setting of pyelonephritis diagnosed yesterday. Persistently tachycardic and low grade fever. WBC improving. rocepin administered, will admit for IV antibiotics and monitoring.    Leo Grosser, MD 03/12/15 [phone]

## 2015-03-12 DIAGNOSIS — F259 Schizoaffective disorder, unspecified: Secondary | ICD-10-CM

## 2015-03-12 DIAGNOSIS — E039 Hypothyroidism, unspecified: Secondary | ICD-10-CM | POA: Diagnosis present

## 2015-03-12 DIAGNOSIS — N390 Urinary tract infection, site not specified: Secondary | ICD-10-CM | POA: Diagnosis present

## 2015-03-12 DIAGNOSIS — A4150 Gram-negative sepsis, unspecified: Secondary | ICD-10-CM | POA: Diagnosis present

## 2015-03-12 DIAGNOSIS — G8929 Other chronic pain: Secondary | ICD-10-CM

## 2015-03-12 DIAGNOSIS — R7881 Bacteremia: Secondary | ICD-10-CM | POA: Diagnosis present

## 2015-03-12 DIAGNOSIS — F19951 Other psychoactive substance use, unspecified with psychoactive substance-induced psychotic disorder with hallucinations: Secondary | ICD-10-CM | POA: Diagnosis present

## 2015-03-12 DIAGNOSIS — M25561 Pain in right knee: Secondary | ICD-10-CM

## 2015-03-12 DIAGNOSIS — D72829 Elevated white blood cell count, unspecified: Secondary | ICD-10-CM | POA: Diagnosis present

## 2015-03-12 DIAGNOSIS — E876 Hypokalemia: Secondary | ICD-10-CM | POA: Diagnosis present

## 2015-03-12 DIAGNOSIS — E038 Other specified hypothyroidism: Secondary | ICD-10-CM

## 2015-03-12 DIAGNOSIS — E1140 Type 2 diabetes mellitus with diabetic neuropathy, unspecified: Secondary | ICD-10-CM

## 2015-03-12 DIAGNOSIS — Z9884 Bariatric surgery status: Secondary | ICD-10-CM

## 2015-03-12 LAB — BASIC METABOLIC PANEL
Anion gap: 8 (ref 5–15)
Anion gap: 8 (ref 5–15)
BUN: 10 mg/dL (ref 6–20)
BUN: 9 mg/dL (ref 6–20)
CO2: 24 mmol/L (ref 22–32)
CO2: 23 mmol/L (ref 22–32)
Calcium: 8.1 mg/dL — ABNORMAL LOW (ref 8.9–10.3)
Calcium: 8.3 mg/dL — ABNORMAL LOW (ref 8.9–10.3)
Chloride: 104 mmol/L (ref 101–111)
Chloride: 108 mmol/L (ref 101–111)
Creatinine, Ser: 0.88 mg/dL (ref 0.44–1.00)
Creatinine, Ser: 0.71 mg/dL (ref 0.44–1.00)
GFR calc Af Amer: >60 mL/min (ref >60)
GFR calc Af Amer: >60 mL/min (ref >60)
GFR calc non Af Amer: >60 mL/min (ref >60)
GFR calc non Af Amer: >60 mL/min (ref >60)
Glucose, Bld: 158 mg/dL — ABNORMAL HIGH (ref 65–99)
Glucose, Bld: 116 mg/dL — ABNORMAL HIGH (ref 65–99)
Potassium: 2.4 mmol/L — CL (ref 3.5–5.1)
Potassium: 2.9 mmol/L — ABNORMAL LOW (ref 3.5–5.1)
Sodium: 136 mmol/L (ref 135–145)
Sodium: 139 mmol/L (ref 135–145)

## 2015-03-12 LAB — CBC
HCT: 34.3 % — ABNORMAL LOW (ref 36.0–46.0)
Hemoglobin: 11.3 g/dL — ABNORMAL LOW (ref 12.0–15.0)
MCH: 26.1 pg (ref 26.0–34.0)
MCHC: 32.9 g/dL (ref 30.0–36.0)
MCV: 79.2 fL (ref 78.0–100.0)
Platelets: 143 10*3/uL — ABNORMAL LOW (ref 150–400)
RBC: 4.33 MIL/uL (ref 3.87–5.11)
RDW: 18.1 % — ABNORMAL HIGH (ref 11.5–15.5)
WBC: 6.9 10*3/uL (ref 4.0–10.5)

## 2015-03-12 LAB — URINALYSIS, ROUTINE W REFLEX MICROSCOPIC
Glucose, UA: NEGATIVE mg/dL
Ketones, ur: 15 mg/dL — AB
Nitrite: NEGATIVE
Protein, ur: 100 mg/dL — AB
Specific Gravity, Urine: 1.024 (ref 1.005–1.030)
Urobilinogen, UA: 4 mg/dL — ABNORMAL HIGH (ref 0.0–1.0)
pH: 6 (ref 5.0–8.0)

## 2015-03-12 LAB — CREATININE, SERUM
Creatinine, Ser: 0.65 mg/dL (ref 0.44–1.00)
GFR calc Af Amer: >60 mL/min (ref >60)
GFR calc non Af Amer: >60 mL/min (ref >60)

## 2015-03-12 LAB — MAGNESIUM: Magnesium: 1.9 mg/dL (ref 1.7–2.4)

## 2015-03-12 LAB — URINE MICROSCOPIC-ADD ON

## 2015-03-12 LAB — LACTIC ACID, PLASMA: Lactic Acid, Venous: 1.1 mmol/L (ref 0.5–2.0)

## 2015-03-12 MED ORDER — FLUTICASONE PROPIONATE 50 MCG/ACT NA SUSP
2.0000 | Freq: Every day | NASAL | Status: DC | PRN
  Administered 2015-03-12: 2 via NASAL
  Filled 2015-03-12 (×2): qty 16

## 2015-03-12 MED ORDER — ALPRAZOLAM 1 MG PO TABS
1.0000 mg | ORAL_TABLET | Freq: Three times a day (TID) | ORAL | Status: DC | PRN
  Administered 2015-03-13: 1 mg via ORAL
  Filled 2015-03-12: qty 1

## 2015-03-12 MED ORDER — CITALOPRAM HYDROBROMIDE 20 MG PO TABS
20.0000 mg | ORAL_TABLET | Freq: Every day | ORAL | Status: DC
Start: 2015-03-12 — End: 2015-03-12
  Filled 2015-03-12: qty 1

## 2015-03-12 MED ORDER — ALPRAZOLAM 1 MG PO TABS
1.0000 mg | ORAL_TABLET | Freq: Four times a day (QID) | ORAL | Status: DC
  Administered 2015-03-12: 1 mg via ORAL
  Filled 2015-03-12 (×2): qty 1

## 2015-03-12 MED ORDER — SODIUM CHLORIDE 0.9 % IV BOLUS (SEPSIS)
1000.0000 mL | Freq: Once | INTRAVENOUS | Status: AC
  Administered 2015-03-12: 1000 mL via INTRAVENOUS

## 2015-03-12 MED ORDER — INFLUENZA VAC SPLIT QUAD 0.5 ML IM SUSY
0.5000 mL | PREFILLED_SYRINGE | INTRAMUSCULAR | Status: AC
  Administered 2015-03-13: 0.5 mL via INTRAMUSCULAR
  Filled 2015-03-12 (×2): qty 0.5

## 2015-03-12 MED ORDER — VITAMIN B-12 1000 MCG PO TABS
1000.0000 ug | ORAL_TABLET | Freq: Every morning | ORAL | Status: DC
  Administered 2015-03-12 – 2015-03-13 (×2): 1000 ug via ORAL
  Filled 2015-03-12 (×2): qty 1

## 2015-03-12 MED ORDER — QUETIAPINE FUMARATE 100 MG PO TABS
100.0000 mg | ORAL_TABLET | Freq: Every day | ORAL | Status: DC
  Administered 2015-03-12 (×2): 100 mg via ORAL
  Filled 2015-03-12 (×3): qty 1

## 2015-03-12 MED ORDER — POTASSIUM CHLORIDE CRYS ER 20 MEQ PO TBCR
40.0000 meq | EXTENDED_RELEASE_TABLET | Freq: Three times a day (TID) | ORAL | Status: AC
  Administered 2015-03-12 (×4): 40 meq via ORAL
  Filled 2015-03-12 (×4): qty 2

## 2015-03-12 MED ORDER — ONDANSETRON HCL 4 MG PO TABS: 4.0000 mg | ORAL_TABLET | Freq: Four times a day (QID) | ORAL | Status: DC | PRN

## 2015-03-12 MED ORDER — ZOLPIDEM TARTRATE 5 MG PO TABS
5.0000 mg | ORAL_TABLET | Freq: Every day | ORAL | Status: DC
  Administered 2015-03-12 (×2): 5 mg via ORAL
  Filled 2015-03-12 (×2): qty 1

## 2015-03-12 MED ORDER — DICLOFENAC SODIUM 1 % TD GEL
1.0000 "application " | Freq: Four times a day (QID) | TRANSDERMAL | Status: DC | PRN
  Filled 2015-03-12: qty 100

## 2015-03-12 MED ORDER — MOMETASONE FURO-FORMOTEROL FUM 200-5 MCG/ACT IN AERO
2.0000 | INHALATION_SPRAY | Freq: Two times a day (BID) | RESPIRATORY_TRACT | Status: DC
  Administered 2015-03-12 – 2015-03-13 (×3): 2 via RESPIRATORY_TRACT
  Filled 2015-03-12: qty 8.8

## 2015-03-12 MED ORDER — ENOXAPARIN SODIUM 60 MG/0.6ML ~~LOC~~ SOLN
60.0000 mg | SUBCUTANEOUS | Status: DC
  Administered 2015-03-12 – 2015-03-13 (×2): 60 mg via SUBCUTANEOUS
  Filled 2015-03-12 (×2): qty 0.6

## 2015-03-12 MED ORDER — POTASSIUM CHLORIDE IN NACL 40-0.9 MEQ/L-% IV SOLN
INTRAVENOUS | Status: DC
Start: 2015-03-12 — End: 2015-03-13
  Administered 2015-03-12: 75 mL/h via INTRAVENOUS
  Administered 2015-03-12: 125 mL/h via INTRAVENOUS
  Filled 2015-03-12 (×4): qty 1000

## 2015-03-12 MED ORDER — POTASSIUM CHLORIDE CRYS ER 20 MEQ PO TBCR: 40.0000 meq | EXTENDED_RELEASE_TABLET | Freq: Three times a day (TID) | ORAL | Status: DC

## 2015-03-12 MED ORDER — MORPHINE SULFATE ER 15 MG PO TBCR
15.0000 mg | EXTENDED_RELEASE_TABLET | Freq: Two times a day (BID) | ORAL | Status: DC
  Administered 2015-03-12: 15 mg via ORAL
  Filled 2015-03-12 (×2): qty 1

## 2015-03-12 MED ORDER — DEXTROSE 5 % IV SOLN
2.0000 g | Freq: Every day | INTRAVENOUS | Status: DC
  Administered 2015-03-12 – 2015-03-13 (×2): 2 g via INTRAVENOUS
  Filled 2015-03-12 (×2): qty 2

## 2015-03-12 MED ORDER — SODIUM CHLORIDE 0.9 % IV BOLUS (SEPSIS): 1000.0000 mL | Freq: Once | INTRAVENOUS | Status: DC

## 2015-03-12 MED ORDER — OXYCODONE HCL 5 MG PO TABS: 10.0000 mg | ORAL_TABLET | Freq: Four times a day (QID) | ORAL | Status: DC | PRN

## 2015-03-12 MED ORDER — METHYLPHENIDATE HCL 5 MG PO TABS
20.0000 mg | ORAL_TABLET | Freq: Two times a day (BID) | ORAL | Status: DC
  Administered 2015-03-12: 20 mg via ORAL
  Filled 2015-03-12: qty 4

## 2015-03-12 MED ORDER — ALPRAZOLAM 1 MG PO TABS
1.0000 mg | ORAL_TABLET | Freq: Once | ORAL | Status: AC
  Administered 2015-03-12: 1 mg via ORAL
  Filled 2015-03-12: qty 1

## 2015-03-12 MED ORDER — ONDANSETRON HCL 4 MG/2ML IJ SOLN
4.0000 mg | Freq: Four times a day (QID) | INTRAMUSCULAR | Status: DC | PRN
  Administered 2015-03-12 (×2): 4 mg via INTRAVENOUS
  Filled 2015-03-12 (×2): qty 2

## 2015-03-12 MED ORDER — SODIUM CHLORIDE 0.9 % IV SOLN
INTRAVENOUS | Status: DC
  Administered 2015-03-12: 02:00:00 via INTRAVENOUS

## 2015-03-12 MED ORDER — LEVOTHYROXINE SODIUM 125 MCG PO TABS
125.0000 ug | ORAL_TABLET | Freq: Every day | ORAL | Status: DC
  Administered 2015-03-12 – 2015-03-13 (×2): 125 ug via ORAL
  Filled 2015-03-12 (×3): qty 1

## 2015-03-12 MED ORDER — GABAPENTIN 300 MG PO CAPS
600.0000 mg | ORAL_CAPSULE | Freq: Two times a day (BID) | ORAL | Status: DC
  Administered 2015-03-12 – 2015-03-13 (×4): 600 mg via ORAL
  Filled 2015-03-12 (×5): qty 2

## 2015-03-12 NOTE — Progress Notes (Signed)
ANTIBIOTIC CONSULT NOTE - INITIAL  Pharmacy Consult for Ceftriaxone Indication: UTI  Allergies  Allergen Reactions  . Aspirin Anaphylaxis  . Sulfa Antibiotics Anaphylaxis  . Other     No blood products.  Patient did  Request that only albumin or albumin-containing products may be administered    Patient Measurements: Height: 5\' 5"  (165.1 cm) Weight: 273 lb (123.832 kg) IBW/kg (Calculated) : 57 Adjusted Body Weight:   Vital Signs: Temp: 98.5 F (36.9 C) (09/28 0200) Temp Source: Axillary (09/28 0200) BP: 108/58 mmHg (09/28 0200) Pulse Rate: 87 (09/28 0200) Intake/Output from previous day: 09/27 0701 - 09/28 0700 In: 1290 [P.O.:240; IV Piggyback:1050] Out: -  Intake/Output from this shift: Total I/O In: 1290 [P.O.:240; IV Piggyback:1050] Out: -   Labs:  Recent Labs  03/10/15 1506 03/11/15 2127  WBC 16.0* 8.3  HGB 14.4 13.2  PLT 216 140*  CREATININE 0.84 0.97   Estimated Creatinine Clearance: 87.6 mL/min (by C-G formula based on Cr of 0.97). No results for input(s): VANCOTROUGH, VANCOPEAK, VANCORANDOM, GENTTROUGH, GENTPEAK, GENTRANDOM, TOBRATROUGH, TOBRAPEAK, TOBRARND, AMIKACINPEAK, AMIKACINTROU, AMIKACIN in the last 72 hours.   Microbiology: Recent Results (from the past 720 hour(s))  Blood culture (routine x 2)     Status: None (Preliminary result)   Collection Time: 03/10/15  6:05 PM  Result Value Ref Range Status   Specimen Description BLOOD RIGHT ANTECUBITAL  Final   Special Requests BOTTLES DRAWN AEROBIC AND ANAEROBIC 3ML  Final   Culture   Final    NO GROWTH < 24 HOURS Performed at [facility]    Report Status PENDING  Incomplete  Blood culture (routine x 2)     Status: None (Preliminary result)   Collection Time: 03/10/15  6:05 PM  Result Value Ref Range Status   Specimen Description BLOOD LEFT ANTECUBITAL  Final   Special Requests BOTTLES DRAWN AEROBIC AND ANAEROBIC 5ML  Final   Culture  Setup Time   Final    GRAM NEGATIVE  RODS ANAEROBIC BOTTLE ONLY CRITICAL RESULT CALLED TO, READ BACK BY AND VERIFIED WITH: L. MILLER,RN AT 1107 ON [id number] BY Rhea Bleacher    Culture   Final    NO GROWTH < 24 HOURS Performed at [facility]    Report Status PENDING  Incomplete    Medical History: Past Medical History  Diagnosis Date  . History of knee replacement, total   . Fever   . Breast lump   . Breast discharge   . Breast pain   . Diabetes mellitus   . Thyroid disease   . Asthma   . Arthritis   . Depression   . Hypertension   . Bipolar 1 disorder   . Hypothyroidism   . Sleep apnea   . Graves disease   . Graves disease   . Pneumonia     2015,2014  . Anxiety   . GERD (gastroesophageal reflux disease)     Medications:  Anti-infectives    Start     Dose/Rate Route Frequency Ordered Stop   03/12/15 1000  cefTRIAXone (ROCEPHIN) 2 g in dextrose 5 % 50 mL IVPB     2 g 100 mL/hr over 30 Minutes Intravenous Daily 03/12/15 0018     03/11/15 2130  cefTRIAXone (ROCEPHIN) 1 g in dextrose 5 % 50 mL IVPB     1 g 100 mL/hr over 30 Minutes Intravenous  Once 03/11/15 2127 03/11/15 2228     Assessment: Patient with UTI and called back to hospital due  to GNR (+) cultures.  Goal of Therapy:  Rocephin based on manufacturer dosing recommendations.  Ceftriaxone 2gm iv q24hr    Plan: Ceftriaxone 2gm iv q24hr Follow up culture results  Nani Skillern Crowford 03/12/2015,2:05 AM

## 2015-03-12 NOTE — Progress Notes (Addendum)
Patient ID: Caroline Williams, female   DOB: October 02, 1960, 54 y.o.   MRN: [id number] [facility] PROGRESS NOTE  Caroline Williams CHE:[id number] DOB: 09-19-1960 DOA: 03/11/2015 PCP: Antony Blackbird, MD  Brief narrative:    54 y.o. female with a past medical history significant for hypothyroidism, morbid obesity s/p gastric bypass, chronic pain from osteoarthritis, anxiety, depression , ADHD. She was recently seen in emergency room because of reports of lower back pain and urinary frequency. Her blood cultures were taken at that time but patient was subsequently discharged home with Keflex. She was called back because blood cultures during that ED visit demonstrated gram-negative rods.  Anticipated discharge: Blood cultures repeated to ensure clearance of bacteremia. Because of reports of hallucinations we consulted psych today.  Assessment/Plan:    Principal Problem:   Sepsis secondary to Gram-negative bacteremia / Leukocytosis / UTI - Sepsis criteria met on the admission with fever, tachycardia, slight hypotension and source of infection gram negative rod bacteremia based on blood cultures obtained during recent ED visit. She did have small leukocytes on UA on admission so possible UTI source as well.  - Patient started on IV Rocephin - Blood cultures repeated. All blood cultures are pending at this time. Urine culture is pending as well. - Leukocytosis has since resolved. - Patient is hemodynamically stable and has not required pressor support.  Active Problems:   Morbid obesity - Counseled on nutrition    Diabetes mellitus with neuropathy - Check A1c. - On sliding-scale insulin. - Continue gabapentin for neuropathy.    Chronic pain of right knee - Would minimize pain medications because of reports of hallucinations. We will leave only oxycodone as needed for pain control and we will stop long-acting pain meds    Lap gastric bypass - In June 2016, stable.    Hallucination,  drug-induced - Psychiatry consulted - Minimize opioids  - Continue Zoloft 100 mg at bedtime    Hypokalemia - Likely from sepsis, bacteremia - Supplemented    Hypothyroidism - Synthroid 125 g daily   DVT Prophylaxis  -Lovenox subQ in hospital    Code Status: Full.  Family Communication:  plan of care discussed with the patient Disposition Plan: needs psych eval today for hallucinations, also needs repeat blood cultures to make sure bacteremia cleared up   IV access:  Peripheral IV  Procedures and diagnostic studies:    Dg Chest 2 View 03/10/2015  No acute pulmonary process.      Ct Head Wo Contrast 03/10/2015   Normal exam.     US Abdomen Limited 03/10/2015   Normal RIGHT upper quadrant ultrasound.     Medical Consultants:  Psychiatry   Other Consultants:  None   IAnti-Infectives:   Rocephin 03/11/2015 -->    Leisa Lenz, MD  [facility] Pager [phone]  Time spent in minutes: 25 minutes  If 7PM-7AM, please contact night-coverage [email or web address] Password [facility] 03/12/2015, 11:39 AM   LOS: 1 day    HPI/Subjective: No acute overnight events. Patient was ok this am but later reported by RN pt has hallucinations.   Objective: Filed Vitals:   03/12/15 0025 03/12/15 0200 03/12/15 0500 03/12/15 0750  BP: 126/70 108/58 124/70   Pulse: 90 87 80 93  Temp: 99.3 F (37.4 C) 98.5 F (36.9 C) 97.8 F (36.6 C)   TempSrc: Oral Axillary Axillary   Resp: 18 18 18 16   Height:      Weight:      SpO2: 100% 92% 94% 93%  Intake/Output Summary (Last 24 hours) at 03/12/15 1139 Last data filed at 03/12/15 0600  Gross per 24 hour  Intake 2729.16 ml  Output      0 ml  Net 2729.16 ml    Exam:   General:  Pt is alert, not in acute distress  Cardiovascular: Regular rate and rhythm, S1/S2, no murmurs  Respiratory: Clear to auscultation bilaterally, no wheezing, no crackles, no rhonchi  Abdomen: Soft, non tender, non distended, bowel sounds  present  Extremities: No edema, pulses DP and PT palpable bilaterally  Neuro: Grossly nonfocal  Data Reviewed: Basic Metabolic Panel:  Recent Labs Lab 03/10/15 1506 03/11/15 2127 03/12/15 0115 03/12/15 0500 03/12/15 0520  NA 137 134* 136  --  139  K 2.8* 3.6 2.4*  --  2.9*  CL 104 104 104  --  108  CO2 24 24 24   --  23  GLUCOSE 142* 108* 158*  --  116*  BUN 7 9 10   --  9  CREATININE 0.84 0.97 0.88 0.65 0.71  CALCIUM 9.4 8.6* 8.1*  --  8.3*  MG  --   --   --   --  1.9   Liver Function Tests:  Recent Labs Lab 03/10/15 1506  AST 29  ALT 28  ALKPHOS 102  BILITOT 1.1  PROT 8.0  ALBUMIN 4.0    Recent Labs Lab 03/10/15 1506  LIPASE 11*   No results for input(s): AMMONIA in the last 168 hours. CBC:  Recent Labs Lab 03/10/15 1506 03/11/15 2127 03/12/15 0115  WBC 16.0* 8.3 6.9  NEUTROABS  --  6.9  --   HGB 14.4 13.2 11.3*  HCT 43.3 39.9 34.3*  MCV 78.6 78.9 79.2  PLT 216 140* 143*   Cardiac Enzymes: No results for input(s): CKTOTAL, CKMB, CKMBINDEX, TROPONINI in the last 168 hours. BNP: Invalid input(s): POCBNP CBG:  Recent Labs Lab 03/10/15 1810  GLUCAP 141*    Recent Results (from the past 240 hour(s))  Blood culture (routine x 2)     Status: None (Preliminary result)   Collection Time: 03/10/15  6:05 PM  Result Value Ref Range Status   Specimen Description BLOOD RIGHT ANTECUBITAL  Final   Special Requests BOTTLES DRAWN AEROBIC AND ANAEROBIC 3ML  Final   Culture   Final    NO GROWTH < 24 HOURS Performed at [facility]    Report Status PENDING  Incomplete  Blood culture (routine x 2)     Status: None (Preliminary result)   Collection Time: 03/10/15  6:05 PM  Result Value Ref Range Status   Specimen Description BLOOD LEFT ANTECUBITAL  Final   Special Requests BOTTLES DRAWN AEROBIC AND ANAEROBIC 5ML  Final   Culture  Setup Time   Final    GRAM NEGATIVE RODS ANAEROBIC BOTTLE ONLY CRITICAL RESULT CALLED TO, READ BACK BY AND VERIFIED  WITH: L. MILLER,RN AT 1107 ON [id number] BY Rhea Bleacher    Culture   Final    GRAM NEGATIVE RODS Performed at [facility]    Report Status PENDING  Incomplete     Scheduled Meds: . cefTRIAXone (ROCEPHIN)  IV  2 g Intravenous Daily  . enoxaparin (LOVENOX) injection  60 mg Subcutaneous Q24H  . gabapentin  600 mg Oral BID  . levothyroxine  125 mcg Oral QAC breakfast  . mometasone-formoterol  2 puff Inhalation BID  . potassium chloride  40 mEq Oral TID  . QUEtiapine  100 mg Oral QHS  . vitamin  B-12  1,000 mcg Oral q morning - 10a  . zolpidem  5 mg Oral QHS   Continuous Infusions: . 0.9 % NaCl with KCl 40 mEq / L 75 mL/hr (03/12/15 1127)

## 2015-03-12 NOTE — Progress Notes (Signed)
Patient having hallucinations, of children playing in hall.  Acknowledges that hallucinations are not real.  Also, having short term memory problems.  Dr. Charlies Silvers notified, changed medicines.

## 2015-03-12 NOTE — Progress Notes (Signed)
CRITICAL VALUE ALERT  Critical value received:  K+ 2.4  Date of notification:  03/12/15  Time of notification:  8003  Critical value read back:Yes.    Nurse who received alert:  Cindi Carbon  MD notified (1st page):  [place]   Time of first page:  0255  MD notified (2nd page):  Time of second page:  Responding MD:  Loleta Books  Time MD responded:  [phone]

## 2015-03-12 NOTE — Progress Notes (Signed)
Lovenox per Pharmacy for DVT Prophylaxis    Pharmacy has been consulted from dosing enoxaparin (lovenox) in this patient for DVT prophylaxis.  The pharmacist has reviewed pertinent labs (Hgb _13.2__; PLT_140__), patient weight (_123.8__kg) and renal function (CrCl_87__mL/min) and decided that enoxaparin _60_mg SQ Q_24_Hrs is appropriate for this patient.  The pharmacy department will sign off at this time.  Please reconsult pharmacy if status changes or for further issues.  Thank you  Cyndia Diver PharmD, BCPS  03/12/2015, 2:13 AM

## 2015-03-12 NOTE — Consult Note (Signed)
[facility] Face-to-Face Psychiatry Consult   Reason for Consult:  Visual hallucinations and history of schizoaffective disorder Referring Physician:  Dr. Charlies Silvers Patient Identification: Caroline Williams MRN:  [id number] Principal Diagnosis: Sepsis due to Gram negative bacteria Diagnosis:   Patient Active Problem List   Diagnosis Date Noted  . Hallucination, drug-induced [F19.951] 03/12/2015  . Leukocytosis [D72.829] 03/12/2015  . Hypokalemia [E87.6] 03/12/2015  . Hypothyroidism [E03.9] 03/12/2015  . UTI (urinary tract infection) [N39.0] 03/12/2015  . Sepsis due to Gram negative bacteria [A41.50]   . Gram-negative bacteremia [R78.81, A41.50] 03/11/2015  . Lap gastric bypass June 2016 [Z98.84] 11/25/2014  . Diabetes mellitus with neuropathy [E11.40] 06/20/2013  . Chronic pain of right knee [M25.561, G89.29] 06/20/2013  . Morbid obesity [E66.01] 07/05/2012    Total Time spent with patient: 45 minutes  Subjective:   Caroline Williams is a 54 y.o. female patient admitted with sepsis and UTI and now visual hallucinations.  HPI:  Caroline Williams is a 54 y.o. female seen, chart reviewed for psychiatric consultation and evaluation of recent onset visual hallucinations. Patient was admitted to [facility] for urinary tract infections, nausea, vomiting, low back pain and urinary frequency. Patient was initially prescribed antiemetics and sent her home and then called back to the hospital when she had urine cultures came back with the gram-negative rods.  with a past medical history significant for hypothyroidism, morbid obesity s/p gastric bypass, chronic pain from osteoarthritis on opioids, resolved who presents with positive blood culture and UTI. Patient reported visual hallucinations of seeing children  running in the hallway at least 3 times since admission and at the same time she knows that part of her psychosis and trying to ignore. Patient reported the past she was stressed out she has seen  things running in the corner of her eyes and also reportedly talks herself. Patient denied, and auditory hallucinations. Patient denied current suicidal/homicidal ideation, intention or plans. Patient does not appear to be responding to internal stimuli during my evaluation. Patient requested to keep her medication without any changes. Patient denied distance of sleep and appetite. Patient has been compliant with her medication management. Patient has intact cognitions, orientation, memory, language functions and has no signs and symptoms of delirium.   Past Psychiatric History: Patient has multiple acute psychiatric hospitalization while living in [place] both in [place] for schizoaffective disorder. Patient reportedly suffering with schizoaffective disorder, Maj. depression, panic disorder, ADHD and has been receiving outpatient medication management from head psychiatry.  Risk to Self: Is patient at risk for suicide?: No Risk to Others:   Prior Inpatient Therapy:   Prior Outpatient Therapy:    Past Medical History:  Past Medical History  Diagnosis Date  . History of knee replacement, total   . Fever   . Breast lump   . Breast discharge   . Breast pain   . Diabetes mellitus   . Thyroid disease   . Asthma   . Arthritis   . Depression   . Hypertension   . Bipolar 1 disorder   . Hypothyroidism   . Sleep apnea   . Graves disease   . Graves disease   . Pneumonia     2015,2014  . Anxiety   . GERD (gastroesophageal reflux disease)     Past Surgical History  Procedure Laterality Date  . Cesarean section    . Breast lumpectomy      right  . Hernia repair    . Appendectomy    .  Abdominal hysterectomy      partial  . Knee surgery      right  . Myomectomy abdominal approach  1989  . Myomectomy abdominal approach  1989  . Cardiac catheterization      no significant CAD, nl LV function by 11/08/06 cath  . Total knee revision  06/21/2012    Procedure: TOTAL KNEE  REVISION;  Surgeon: Newt Minion, MD;  Location: [facility];  Service: Orthopedics;  Laterality: Right;  Revision Right Total Knee Arthroplasty  . Gastric roux-en-y N/A 11/25/2014    Procedure: LAPAROSCOPIC ROUX-EN-Y GASTRIC BYPASS WITH UPPER ENDOSCOPY;  Surgeon: Johnathan Hausen, MD;  Location: [facility];  Service: General;  Laterality: N/A;   Family History:  Family History  Problem Relation Age of Onset  . Cancer Father     colon  . Stroke Father   . Heart disease Father   . Diabetes Father   . Hypertension Father   . Depression Sister   . Hypertension Mother    Family Psychiatric  History: Patient mother has unknown mental health history but noncompliant with the medication and her daughter diagnosed with ADHD and bipolar disorder but refusing medication management. Social History:  History  Alcohol Use No     History  Drug Use No    Social History   Social History  . Marital Status: Single    Spouse Name: N/A  . Number of Children: N/A  . Years of Education: N/A   Social History Main Topics  . Smoking status: Former Smoker -- 0.50 packs/day for 4 years    Types: Cigarettes    Quit date: 09/13/2014  . Smokeless tobacco: Never Used  . Alcohol Use: No  . Drug Use: No  . Sexual Activity: Yes    Birth Control/ Protection: None   Other Topics Concern  . None   Social History Narrative   Additional Social History: Patient lives with her boyfriend and a history also relationship problems.      seen, chart reviewed for psychiatric consultation and evaluation  Allergies:   Allergies  Allergen Reactions  . Aspirin Anaphylaxis  . Sulfa Antibiotics Anaphylaxis  . Other     No blood products.  Patient did  Request that only albumin or albumin-containing products may be administered    Labs:  Results for orders placed or performed during the hospital encounter of 03/11/15 (from the past 48 hour(s))  CBC with Differential/Platelet     Status: Abnormal   Collection Time:  03/11/15  9:27 PM  Result Value Ref Range   WBC 8.3 4.0 - 10.5 K/uL   RBC 5.06 3.87 - 5.11 MIL/uL   Hemoglobin 13.2 12.0 - 15.0 g/dL   HCT 39.9 36.0 - 46.0 %   MCV 78.9 78.0 - 100.0 fL   MCH 26.1 26.0 - 34.0 pg   MCHC 33.1 30.0 - 36.0 g/dL   RDW 18.1 (H) 11.5 - 15.5 %   Platelets 140 (L) 150 - 400 K/uL   Neutrophils Relative % 83 %   Neutro Abs 6.9 1.7 - 7.7 K/uL   Lymphocytes Relative 11 %   Lymphs Abs 0.9 0.7 - 4.0 K/uL   Monocytes Relative 6 %   Monocytes Absolute 0.5 0.1 - 1.0 K/uL   Eosinophils Relative 0 %   Eosinophils Absolute 0.0 0.0 - 0.7 K/uL   Basophils Relative 0 %   Basophils Absolute 0.0 0.0 - 0.1 K/uL  Basic metabolic panel     Status: Abnormal  Collection Time: 03/11/15  9:27 PM  Result Value Ref Range   Sodium 134 (L) 135 - 145 mmol/L   Potassium 3.6 3.5 - 5.1 mmol/L    Comment: DELTA CHECK NOTED SLIGHT HEMOLYSIS    Chloride 104 101 - 111 mmol/L   CO2 24 22 - 32 mmol/L   Glucose, Bld 108 (H) 65 - 99 mg/dL   BUN 9 6 - 20 mg/dL   Creatinine, Ser 0.97 0.44 - 1.00 mg/dL   Calcium 8.6 (L) 8.9 - 10.3 mg/dL   GFR calc non Af Amer >60 >60 mL/min   GFR calc Af Amer >60 >60 mL/min    Comment: (NOTE) The eGFR has been calculated using the CKD EPI equation. This calculation has not been validated in all clinical situations. eGFR's persistently <60 mL/min signify possible Chronic Kidney Disease.    Anion gap 6 5 - 15  Urinalysis, Routine w reflex microscopic (not at [facility])     Status: Abnormal   Collection Time: 03/11/15 11:46 PM  Result Value Ref Range   Color, Urine ORANGE (A) YELLOW    Comment: BIOCHEMICALS MAY BE AFFECTED BY COLOR   APPearance CLOUDY (A) CLEAR   Specific Gravity, Urine 1.024 1.005 - 1.030   pH 6.0 5.0 - 8.0   Glucose, UA NEGATIVE NEGATIVE mg/dL   Hgb urine dipstick LARGE (A) NEGATIVE   Bilirubin Urine MODERATE (A) NEGATIVE   Ketones, ur 15 (A) NEGATIVE mg/dL   Protein, ur 100 (A) NEGATIVE mg/dL   Urobilinogen, UA 4.0 (H) 0.0 - 1.0  mg/dL   Nitrite NEGATIVE NEGATIVE   Leukocytes, UA SMALL (A) NEGATIVE  Urine microscopic-add on     Status: Abnormal   Collection Time: 03/11/15 11:46 PM  Result Value Ref Range   Squamous Epithelial / LPF MANY (A) RARE   WBC, UA 11-20 <3 WBC/hpf   RBC / HPF 7-10 <3 RBC/hpf   Bacteria, UA FEW (A) RARE   Casts WBC CAST (A) NEGATIVE  Lactic acid, plasma     Status: None   Collection Time: 03/12/15  1:15 AM  Result Value Ref Range   Lactic Acid, Venous 1.1 0.5 - 2.0 mmol/L  Basic metabolic panel     Status: Abnormal   Collection Time: 03/12/15  1:15 AM  Result Value Ref Range   Sodium 136 135 - 145 mmol/L   Potassium 2.4 (LL) 3.5 - 5.1 mmol/L    Comment: DELTA CHECK NOTED REPEATED TO VERIFY CRITICAL RESULT CALLED TO, READ BACK BY AND VERIFIED WITH: REID,R RN [phone] [id number] [place],N    Chloride 104 101 - 111 mmol/L   CO2 24 22 - 32 mmol/L   Glucose, Bld 158 (H) 65 - 99 mg/dL   BUN 10 6 - 20 mg/dL   Creatinine, Ser 0.88 0.44 - 1.00 mg/dL   Calcium 8.1 (L) 8.9 - 10.3 mg/dL   GFR calc non Af Amer >60 >60 mL/min   GFR calc Af Amer >60 >60 mL/min    Comment: (NOTE) The eGFR has been calculated using the CKD EPI equation. This calculation has not been validated in all clinical situations. eGFR's persistently <60 mL/min signify possible Chronic Kidney Disease.    Anion gap 8 5 - 15  CBC     Status: Abnormal   Collection Time: 03/12/15  1:15 AM  Result Value Ref Range   WBC 6.9 4.0 - 10.5 K/uL   RBC 4.33 3.87 - 5.11 MIL/uL   Hemoglobin 11.3 (L) 12.0 - 15.0 g/dL  HCT 34.3 (L) 36.0 - 46.0 %   MCV 79.2 78.0 - 100.0 fL   MCH 26.1 26.0 - 34.0 pg   MCHC 32.9 30.0 - 36.0 g/dL   RDW 18.1 (H) 11.5 - 15.5 %   Platelets 143 (L) 150 - 400 K/uL  Creatinine, serum     Status: None   Collection Time: 03/12/15  5:00 AM  Result Value Ref Range   Creatinine, Ser 0.65 0.44 - 1.00 mg/dL   GFR calc non Af Amer >60 >60 mL/min   GFR calc Af Amer >60 >60 mL/min    Comment: (NOTE) The eGFR has  been calculated using the CKD EPI equation. This calculation has not been validated in all clinical situations. eGFR's persistently <60 mL/min signify possible Chronic Kidney Disease.   Basic metabolic panel     Status: Abnormal   Collection Time: 03/12/15  5:20 AM  Result Value Ref Range   Sodium 139 135 - 145 mmol/L   Potassium 2.9 (L) 3.5 - 5.1 mmol/L    Comment: REPEATED TO VERIFY DELTA CHECK NOTED NO VISIBLE HEMOLYSIS    Chloride 108 101 - 111 mmol/L   CO2 23 22 - 32 mmol/L   Glucose, Bld 116 (H) 65 - 99 mg/dL   BUN 9 6 - 20 mg/dL   Creatinine, Ser 0.71 0.44 - 1.00 mg/dL   Calcium 8.3 (L) 8.9 - 10.3 mg/dL   GFR calc non Af Amer >60 >60 mL/min   GFR calc Af Amer >60 >60 mL/min    Comment: (NOTE) The eGFR has been calculated using the CKD EPI equation. This calculation has not been validated in all clinical situations. eGFR's persistently <60 mL/min signify possible Chronic Kidney Disease.    Anion gap 8 5 - 15  Magnesium     Status: None   Collection Time: 03/12/15  5:20 AM  Result Value Ref Range   Magnesium 1.9 1.7 - 2.4 mg/dL    Current Facility-Administered Medications  Medication Dose Route Frequency Provider Last Rate Last Dose  . 0.9 % NaCl with KCl 40 mEq / L  infusion   Intravenous Continuous Robbie Lis, MD 75 mL/hr at 03/12/15 1127 75 mL/hr at 03/12/15 1127  . acetaminophen (TYLENOL) tablet 650 mg  650 mg Oral Q6H PRN Edwin Dada, MD   650 mg at 03/11/15 2347  . cefTRIAXone (ROCEPHIN) 2 g in dextrose 5 % 50 mL IVPB  2 g Intravenous Daily Edwin Dada, MD   2 g at 03/12/15 1106  . diclofenac sodium (VOLTAREN) 1 % transdermal gel 1 application  1 application Topical QID PRN Edwin Dada, MD      . enoxaparin (LOVENOX) injection 60 mg  60 mg Subcutaneous Q24H Edwin Dada, MD   60 mg at 03/12/15 1105  . fluticasone (FLONASE) 50 MCG/ACT nasal spray 2 spray  2 spray Each Nare Daily PRN Edwin Dada, MD      .  gabapentin (NEURONTIN) capsule 600 mg  600 mg Oral BID Edwin Dada, MD   600 mg at 03/12/15 1104  . [START ON 03/13/2015] Influenza vac split quadrivalent PF (FLUARIX) injection 0.5 mL  0.5 mL Intramuscular Tomorrow-1000 Robbie Lis, MD      . levothyroxine (SYNTHROID, LEVOTHROID) tablet 125 mcg  125 mcg Oral QAC breakfast Edwin Dada, MD   125 mcg at 03/12/15 [phone]  . mometasone-formoterol (DULERA) 200-5 MCG/ACT inhaler 2 puff  2 puff Inhalation BID Edwin Dada, MD  2 puff at 03/12/15 0749  . ondansetron (ZOFRAN) tablet 4 mg  4 mg Oral Q6H PRN Edwin Dada, MD       Or  . ondansetron (ZOFRAN) injection 4 mg  4 mg Intravenous Q6H PRN Edwin Dada, MD   4 mg at 03/12/15 0932  . oxyCODONE (Oxy IR/ROXICODONE) immediate release tablet 10 mg  10 mg Oral Q6H PRN Edwin Dada, MD      . potassium chloride SA (K-DUR,KLOR-CON) CR tablet 40 mEq  40 mEq Oral TID Edwin Dada, MD   40 mEq at 03/12/15 1103  . QUEtiapine (SEROQUEL) tablet 100 mg  100 mg Oral QHS Edwin Dada, MD   100 mg at 03/12/15 0035  . vitamin B-12 (CYANOCOBALAMIN) tablet 1,000 mcg  1,000 mcg Oral q morning - 10a Edwin Dada, MD   1,000 mcg at 03/12/15 1104  . zolpidem (AMBIEN) tablet 5 mg  5 mg Oral QHS Edwin Dada, MD   5 mg at 03/12/15 0035    Musculoskeletal: Strength & Muscle Tone: within normal limits Gait & Station: unable to stand Patient leans: N/A  Psychiatric Specialty Exam: ROS  No Fever-chills, No Headache, No changes with Vision or hearing, reports vertigo No problems swallowing food or Liquids, No Chest pain, Cough or Shortness of Breath, No Abdominal pain, No Nausea or Vommitting, Bowel movements are regular, No Blood in stool or Urine, No new skin rashes or bruises, No new joints pains-aches,  No new weakness, tingling, numbness in any extremity, No recent weight gain or loss, No polyuria, polydypsia or  polyphagia,  A full 10 point Review of Systems was done, except as stated above, all other Review of Systems were negative.  Blood pressure 124/70, pulse 93, temperature 97.8 F (36.6 C), temperature source Axillary, resp. rate 16, height 5' 5"  (1.651 m), weight 123.832 kg (273 lb), SpO2 93 %.Body mass index is 45.43 kg/(m^2).  General Appearance: Casual  Eye Contact::  Good  Speech:  Clear and Coherent  Volume:  Normal  Mood:  Anxious and Depressed  Affect:  Appropriate and Congruent  Thought Process:  Coherent and Goal Directed  Orientation:  Full (Time, Place, and Person)  Thought Content:  NA, WDL and Hallucinations: Visual  Suicidal Thoughts:  No  Homicidal Thoughts:  No  Memory:  Immediate;   Good Recent;   Good Remote;   Good  Judgement:  Intact  Insight:  Good  Psychomotor Activity:  Normal  Concentration:  Good  Recall:  Good  Fund of Knowledge:Good  Language: Good  Akathisia:  Negative  Handed:  Right  AIMS (if indicated):     Assets:  Communication Skills Desire for Improvement Financial Resources/Insurance Housing Intimacy Leisure Time Physical Health Resilience Social Support Talents/Skills Transportation Vocational/Educational  ADL's:  Intact  Cognition: WNL  Sleep:      Treatment Plan Summary: Daily contact with patient to assess and evaluate symptoms and progress in treatment and Medication management  Disposition: Continue Seroquel 100 mg at bedtime for psychosis, Ambien 5 mg at bedtime for insomnia, gabapentin 600 mg 2 times daily for anxiety Continue to hold methylphenidate secondary to visual hallucinations at this time Monitor for the benzodiazepine withdrawal or seizures Patient does not meet criteria for psychiatric inpatient admission. Supportive therapy provided about ongoing stressors.  Appreciate psychiatric consultation and will sign off Please contact [phone] or [phone] if needs further assistance  Patient will be referred to the  outpatient medication management and  medically stable.   Myah Guynes,JANARDHAHA R. 03/12/2015 12:29 PM

## 2015-03-13 DIAGNOSIS — A4151 Sepsis due to Escherichia coli [E. coli]: Secondary | ICD-10-CM | POA: Insufficient documentation

## 2015-03-13 DIAGNOSIS — N390 Urinary tract infection, site not specified: Secondary | ICD-10-CM

## 2015-03-13 DIAGNOSIS — R7881 Bacteremia: Secondary | ICD-10-CM | POA: Insufficient documentation

## 2015-03-13 DIAGNOSIS — B9620 Unspecified Escherichia coli [E. coli] as the cause of diseases classified elsewhere: Secondary | ICD-10-CM | POA: Insufficient documentation

## 2015-03-13 DIAGNOSIS — D72829 Elevated white blood cell count, unspecified: Secondary | ICD-10-CM

## 2015-03-13 LAB — BASIC METABOLIC PANEL
Anion gap: 8 (ref 5–15)
BUN: <5 mg/dL — ABNORMAL LOW (ref 6–20)
CO2: 21 mmol/L — ABNORMAL LOW (ref 22–32)
Calcium: 8.5 mg/dL — ABNORMAL LOW (ref 8.9–10.3)
Chloride: 111 mmol/L (ref 101–111)
Creatinine, Ser: 0.72 mg/dL (ref 0.44–1.00)
GFR calc Af Amer: >60 mL/min (ref >60)
GFR calc non Af Amer: >60 mL/min (ref >60)
Glucose, Bld: 170 mg/dL — ABNORMAL HIGH (ref 65–99)
Potassium: 3.2 mmol/L — ABNORMAL LOW (ref 3.5–5.1)
Sodium: 140 mmol/L (ref 135–145)

## 2015-03-13 LAB — URINE CULTURE
Culture: >=100000
Culture: NO GROWTH

## 2015-03-13 LAB — CULTURE, BLOOD (ROUTINE X 2)

## 2015-03-13 LAB — HEMOGLOBIN A1C
Hgb A1c MFr Bld: 6.4 % — ABNORMAL HIGH (ref 4.8–5.6)
Mean Plasma Glucose: 137 mg/dL

## 2015-03-13 MED ORDER — POTASSIUM CHLORIDE 10 MEQ/100ML IV SOLN
10.0000 meq | INTRAVENOUS | Status: DC
  Filled 2015-03-13 (×3): qty 100

## 2015-03-13 MED ORDER — POTASSIUM CHLORIDE 20 MEQ/15ML (10%) PO SOLN: 10.0000 meq | Freq: Every day | ORAL | Status: DC

## 2015-03-13 MED ORDER — CIPROFLOXACIN HCL 500 MG PO TABS: 500.0000 mg | ORAL_TABLET | Freq: Two times a day (BID) | ORAL | Status: DC

## 2015-03-13 MED ORDER — PROMETHAZINE HCL 25 MG PO TABS: 25.0000 mg | ORAL_TABLET | Freq: Four times a day (QID) | ORAL | Status: DC | PRN

## 2015-03-13 MED ORDER — POTASSIUM CHLORIDE 20 MEQ/15ML (10%) PO SOLN
40.0000 meq | Freq: Every day | ORAL | Status: DC
  Administered 2015-03-13: 40 meq via ORAL
  Filled 2015-03-13: qty 30

## 2015-03-13 MED ORDER — LEVALBUTEROL HCL 1.25 MG/0.5ML IN NEBU
1.2500 mg | INHALATION_SOLUTION | Freq: Once | RESPIRATORY_TRACT | Status: AC
  Administered 2015-03-13: 1.25 mg via RESPIRATORY_TRACT
  Filled 2015-03-13: qty 0.5

## 2015-03-13 MED ORDER — MORPHINE SULFATE ER 15 MG PO TBCR
15.0000 mg | EXTENDED_RELEASE_TABLET | Freq: Two times a day (BID) | ORAL | Status: DC
  Administered 2015-03-13: 15 mg via ORAL
  Filled 2015-03-13: qty 1

## 2015-03-13 MED ORDER — ALPRAZOLAM 1 MG PO TABS: 1.0000 mg | ORAL_TABLET | Freq: Three times a day (TID) | ORAL | Status: DC

## 2015-03-13 NOTE — Care Management Important Message (Signed)
Important Message  Patient Details  Name: Caroline Williams MRN: [id number] Date of Birth: [birth year]   Medicare Important Message Given:  Yes-second notification given    Shelda Altes 03/13/2015, [place] Message  Patient Details  Name: Caroline Williams MRN: [id number] Date of Birth: 1961-02-21   Medicare Important Message Given:  Yes-second notification given    Shelda Altes 03/13/2015, 3:23 PM

## 2015-03-13 NOTE — Care Management Note (Signed)
Case Management Note  Patient Details  Name: Caroline Williams MRN: [id number] Date of Birth: [birth year]  Subjective/Objective:                    Action/Plan:d/c home no needs or orders.   Expected Discharge Date:                  Expected Discharge Plan:  Home/Self Care  In-House Referral:     Discharge planning Services  CM Consult  Post Acute Care Choice:    Choice offered to:     DME Arranged:    DME Agency:     HH Arranged:    [place] Agency:     Status of Service:  Completed, signed off  Medicare Important Message Given:    Date Medicare IM Given:    Medicare IM give by:    Date Additional Medicare IM Given:    Additional Medicare Important Message give by:     If discussed at [place] of Stay Meetings, dates discussed:    Additional Comments:  Dessa Phi, RN 03/13/2015, 12:31 PM
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
## 2015-03-13 NOTE — Discharge Instructions (Signed)
Bacteremia Bacteremia occurs when bacteria get in your blood. Normal blood does not usually have bacteria. Bacteremia is one way infections can spread from one part of the body to another. CAUSES   Causes may include anything that allows bacteria to get into the body. Examples are:  Catheters.  Intravenous (IV) access tubes.  Cuts or scrapes of the skin.  Temporary bacteremia may occur during dental procedures, while brushing your teeth, or during a bowel movement. This rarely causes any symptoms or medical problems.  Bacteria may also get in the bloodstream as a complication of a bacterial infection elsewhere. This includes infected wounds and bacterial infections of the:  Lungs (pneumonia).  Kidneys (pyelonephritis).  Intestines (enteritis, colitis).  Organs in the abdomen (appendicitis, cholecystitis, diverticulitis). SYMPTOMS  The body is usually able to clear small numbers of bacteria out of the blood quickly. Brief bacteremia usually does not cause problems.   Problems can occur if the bacteria start to grow in number or spread to other parts of the body. If the bacteria start growing, you may develop:  Chills.  Fever.  Nausea.  Vomiting.  Sweating.  Lightheadedness and low blood pressure.  Pain.  If bacteria start to grow in the linings around the brain, it is called meningitis. This can cause severe headaches, many other problems, and even death.  If bacteria start to grow in a joint, it causes arthritis with painful joints. If bacteria start to grow in a bone, it is called osteomyelitis.  Bacteria from the blood can also cause sores (abscesses) in many organs, such as the muscle, liver, spleen, lungs, brain, and kidneys. DIAGNOSIS   This condition is diagnosed by cultures of the blood.  Cultures may also be taken from other parts of the body that are thought to be causing the bacteremia. A small piece of tissue, fluid, or other product of the body is  sampled. The sample is then put on a growth plate to see if any bacteria grows.  Other lab tests may be done and the results may be abnormal. TREATMENT  Treatment requires a stay in the hospital. You will be given antibiotic medicine through an IV access tube. PREVENTION  People with an increased risk of developing bacteremia or complications may be given antibiotics before certain procedures. Examples are:  A person with a heart murmur or artificial heart valve, before having his or her teeth cleaned.  Before having a surgical or other invasive procedure.  Before having a bowel procedure. Document Released: 03/14/2006 Document Revised: 08/23/2011 Document Reviewed: 12/24/2010 [facility] Patient Information 2015 [place], [place]. This information is not intended to replace advice given to you by your health care provider. Make sure you discuss any questions you have with your health care provider. Urinary Tract Infection Urinary tract infections (UTIs) can develop anywhere along your urinary tract. Your urinary tract is your body's drainage system for removing wastes and extra water. Your urinary tract includes two kidneys, two ureters, a bladder, and a urethra. Your kidneys are a pair of bean-shaped organs. Each kidney is about the size of your fist. They are located below your ribs, one on each side of your spine. CAUSES Infections are caused by microbes, which are microscopic organisms, including fungi, viruses, and bacteria. These organisms are so small that they can only be seen through a microscope. Bacteria are the microbes that most commonly cause UTIs. SYMPTOMS  Symptoms of UTIs may vary by age and gender of the patient and by the location of  the infection. Symptoms in young women typically include a frequent and intense urge to urinate and a painful, burning feeling in the bladder or urethra during urination. Older women and men are more likely to be tired, shaky, and weak and have muscle  aches and abdominal pain. A fever may mean the infection is in your kidneys. Other symptoms of a kidney infection include pain in your back or sides below the ribs, nausea, and vomiting. DIAGNOSIS To diagnose a UTI, your caregiver will ask you about your symptoms. Your caregiver also will ask to provide a urine sample. The urine sample will be tested for bacteria and white blood cells. White blood cells are made by your body to help fight infection. TREATMENT  Typically, UTIs can be treated with medication. Because most UTIs are caused by a bacterial infection, they usually can be treated with the use of antibiotics. The choice of antibiotic and length of treatment depend on your symptoms and the type of bacteria causing your infection. HOME CARE INSTRUCTIONS  If you were prescribed antibiotics, take them exactly as your caregiver instructs you. Finish the medication even if you feel better after you have only taken some of the medication.  Drink enough water and fluids to keep your urine clear or pale yellow.  Avoid caffeine, tea, and carbonated beverages. They tend to irritate your bladder.  Empty your bladder often. Avoid holding urine for long periods of time.  Empty your bladder before and after sexual intercourse.  After a bowel movement, women should cleanse from front to back. Use each tissue only once. SEEK MEDICAL CARE IF:   You have back pain.  You develop a fever.  Your symptoms do not begin to resolve within 3 days. SEEK IMMEDIATE MEDICAL CARE IF:   You have severe back pain or lower abdominal pain.  You develop chills.  You have nausea or vomiting.  You have continued burning or discomfort with urination. MAKE SURE YOU:   Understand these instructions.  Will watch your condition.  Will get help right away if you are not doing well or get worse. Document Released: 03/10/2005 Document Revised: 11/30/2011 Document Reviewed: 07/09/2011 [facility] Patient Information  2015 [place], [place]. This information is not intended to replace advice given to you by your health care provider. Make sure you discuss any questions you have with your health care provider. Ciprofloxacin tablets What is this medicine? CIPROFLOXACIN (sip roe FLOX a sin) is a quinolone antibiotic. It is used to treat certain kinds of bacterial infections. It will not work for colds, flu, or other viral infections. This medicine may be used for other purposes; ask your health care provider or pharmacist if you have questions. COMMON BRAND NAME(S): Cipro What should I tell my health care provider before I take this medicine? They need to know if you have any of these conditions: -bone problems -cerebral disease -joint problems -irregular heartbeat -kidney disease -liver disease -myasthenia gravis -seizure disorder -tendon problems -an unusual or allergic reaction to ciprofloxacin, other antibiotics or medicines, foods, dyes, or preservatives -pregnant or trying to get pregnant -breast-feeding How should I use this medicine? Take this medicine by mouth with a glass of water. Follow the directions on the prescription label. Take your medicine at regular intervals. Do not take your medicine more often than directed. Take all of your medicine as directed even if you think your are better. Do not skip doses or stop your medicine early. You can take this medicine with food or on  an empty stomach. It can be taken with a meal that contains dairy or calcium, but do not take it alone with a dairy product, like milk or yogurt or calcium-fortified juice. A special MedGuide will be given to you by the pharmacist with each prescription and refill. Be sure to read this information carefully each time. Talk to your pediatrician regarding the use of this medicine in children. Special care may be needed. Overdosage: If you think you have taken too much of this medicine contact a poison control center or  emergency room at once. NOTE: This medicine is only for you. Do not share this medicine with others. What if I miss a dose? If you miss a dose, take it as soon as you can. If it is almost time for your next dose, take only that dose. Do not take double or extra doses. What may interact with this medicine? Do not take this medicine with any of the following medications: -cisapride -droperidol -terfenadine -tizanidine This medicine may also interact with the following medications: -antacids -birth control pills -caffeine -cyclosporin -didanosine (ddI) buffered tablets or powder -medicines for diabetes -medicines for inflammation like ibuprofen, naproxen -methotrexate -multivitamins -omeprazole -phenytoin -probenecid -sucralfate -theophylline -warfarin This list may not describe all possible interactions. Give your health care provider a list of all the medicines, herbs, non-prescription drugs, or dietary supplements you use. Also tell them if you smoke, drink alcohol, or use illegal drugs. Some items may interact with your medicine. What should I watch for while using this medicine? Tell your doctor or health care professional if your symptoms do not improve. Do not treat diarrhea with over the counter products. Contact your doctor if you have diarrhea that lasts more than 2 days or if it is severe and watery. You may get drowsy or dizzy. Do not drive, use machinery, or do anything that needs mental alertness until you know how this medicine affects you. Do not stand or sit up quickly, especially if you are an older patient. This reduces the risk of dizzy or fainting spells. This medicine can make you more sensitive to the sun. Keep out of the sun. If you cannot avoid being in the sun, wear protective clothing and use sunscreen. Do not use sun lamps or tanning beds/booths. Avoid antacids, aluminum, calcium, iron, magnesium, and zinc products for 6 hours before and 2 hours after taking a  dose of this medicine. What side effects may I notice from receiving this medicine? Side effects that you should report to your doctor or health care professional as soon as possible: - allergic reactions like skin rash, itching or hives, swelling of the face, lips, or tongue - breathing problems - confusion, nightmares or hallucinations - feeling faint or lightheaded, falls - irregular heartbeat - joint, muscle or tendon pain or swelling - pain or trouble passing urine -persistent headache with or without blurred vision - redness, blistering, peeling or loosening of the skin, including inside the mouth - seizure - unusual pain, numbness, tingling, or weakness Side effects that usually do not require medical attention (report to your doctor or health care professional if they continue or are bothersome): - diarrhea - nausea or stomach upset - white patches or sores in the mouth This list may not describe all possible side effects. Call your doctor for medical advice about side effects. You may report side effects to FDA at 1-800-FDA-1088. Where should I keep my medicine? Keep out of the reach of children. Store at room temperature  below 30 degrees C (86 degrees F). Keep container tightly closed. Throw away any unused medicine after the expiration date. NOTE: This sheet is a summary. It may not cover all possible information. If you have questions about this medicine, talk to your doctor, pharmacist, or health care provider.  2015, [employer]. (2013-01-04 16:10:46)

## 2015-03-13 NOTE — Progress Notes (Signed)
PHARMACY NOTE -  Ceftriaxone   Pharmacy has been assisting with dosing of ceftriaxone for UTI.  Dosage remains stable at 2 g IV q24 hr and need for further dosage adjustment appears unlikely at present.    Will sign off at this time.  Please reconsult if a change in clinical status warrants re-evaluation of dosage.  Pharmacy receives notification for all new positive blood cultures.  Reuel Boom, PharmD, BCPS Pager: [phone] 03/13/2015, 1:11 PM

## 2015-03-13 NOTE — Progress Notes (Addendum)
CSW attempted to complete psych assessment. CSW knocked on the door, pt akcnowledged csw presence. CSW entered the room and asked if this was a good time to speak with patient. Patient had phone to her ear and proceeded with her phone conversation and did not acknowledge csw any further. CSW excused self from room.   CSW will attempt to complete assessment at later time. Per psychiatrist notes, pt is psychiatrically stable for discharge with follow up at established provider, [facility]  Belia Heman, [place] Work  [facility] [phone]

## 2015-03-14 ENCOUNTER — Telehealth (HOSPITAL_COMMUNITY): Payer: Self-pay

## 2015-03-14 NOTE — Telephone Encounter (Signed)
Post ED Visit - Positive Culture Follow-up  Culture report reviewed by antimicrobial stewardship pharmacist:  []  Heide Guile, Pharm.D., BCPS []  Alycia Rossetti, Pharm.D., BCPS []  Froid, Pharm.D., BCPS, AAHIVP []  Legrand Como, Pharm.D., BCPS, AAHIVP [x]  [employer], Pharm.D. []  Cassie Nicole Kindred, Pharm.D.  Positive Urine culture, >/= 100,000 colonies -> E Coli & Bld Cx -> E Coli Pt was called 9/27 and asked to return was admitted 9/27 and dcd 9/29 on ciprofloxacin -> organism sensitive to the same and no further patient follow-up is required at this time.  Dortha Kern 03/14/2015, 11:16 AM

## 2015-03-15 LAB — CULTURE, BLOOD (ROUTINE X 2): Culture: NO GROWTH

## 2015-03-17 DIAGNOSIS — E876 Hypokalemia: Secondary | ICD-10-CM | POA: Diagnosis not present

## 2015-03-17 DIAGNOSIS — E119 Type 2 diabetes mellitus without complications: Secondary | ICD-10-CM | POA: Diagnosis not present

## 2015-03-17 DIAGNOSIS — N390 Urinary tract infection, site not specified: Secondary | ICD-10-CM | POA: Diagnosis not present

## 2015-03-17 DIAGNOSIS — D473 Essential (hemorrhagic) thrombocythemia: Secondary | ICD-10-CM | POA: Diagnosis not present

## 2015-03-17 DIAGNOSIS — R110 Nausea: Secondary | ICD-10-CM | POA: Diagnosis not present

## 2015-03-17 DIAGNOSIS — R7881 Bacteremia: Secondary | ICD-10-CM | POA: Diagnosis not present

## 2015-03-17 LAB — CULTURE, BLOOD (ROUTINE X 2)
Culture: NO GROWTH
Culture: NO GROWTH

## 2015-03-19 DIAGNOSIS — F250 Schizoaffective disorder, bipolar type: Secondary | ICD-10-CM | POA: Diagnosis not present

## 2015-03-19 DIAGNOSIS — F4321 Adjustment disorder with depressed mood: Secondary | ICD-10-CM | POA: Diagnosis not present

## 2015-03-31 ENCOUNTER — Encounter: Payer: Self-pay | Admitting: Dietician

## 2015-03-31 ENCOUNTER — Encounter: Payer: Medicare Other | Attending: Surgery | Admitting: Dietician

## 2015-03-31 DIAGNOSIS — E119 Type 2 diabetes mellitus without complications: Secondary | ICD-10-CM | POA: Diagnosis not present

## 2015-03-31 DIAGNOSIS — Z6841 Body Mass Index (BMI) 40.0 and over, adult: Secondary | ICD-10-CM | POA: Diagnosis not present

## 2015-03-31 DIAGNOSIS — Z01818 Encounter for other preprocedural examination: Secondary | ICD-10-CM | POA: Diagnosis not present

## 2015-03-31 NOTE — Patient Instructions (Addendum)
Goals:  Follow Phase 3B: High Protein + Non-Starchy Vegetables  Eat 3-6 small meals/snacks, every 3-5 hrs  Increase lean protein foods to meet 60g goal  Increase fluid intake to 64oz +  Avoid drinking 15 minutes before, during and 30 minutes after eating  Aim for >30 min of physical activity daily  Remember that you need to eat to lose weight  Start phasing out protein shakes and add real food  Try raw veggies with hummus instead of pita chips   Try cheese chips: Pour a heaping tablespoon of Parmesan onto a silicone or parchment lined baking sheet and lightly pat down. A silicone baking sheet is highly recommended. Repeat with the remaining cheese, spacing the spoonfuls about a 1/2 inch apart. Bake for 3 to 5 minutes or until golden and crisp.  Try [employer] protein chips for a crunch  Try mixing fat free Greek yogurt with [employer]  Try egg/chicken salad with plain greek yogurt, mustard, and pickle relish   Surgery date: 11/25/2014 Surgery type: RYGB Start weight at [facility]: 326.5 on 11/08/13 Weight today: 269 lbs Weight change: 23 lbs Total weight lost: 57.5 lbs  TANITA  BODY COMP RESULTS  10/07/14 12/24/14 02/03/15 03/31/15   BMI (kg/m^2) 54.1 51.1 48.6 44.8   Fat Mass (lbs) 189 171.5 154.5 137.5   Fat Free Mass (lbs) 136 135.5 137.5 131.5   Total Body Water (lbs) 99.5 99.0 100.5 96.5

## 2015-03-31 NOTE — Progress Notes (Signed)
  Follow-up visit:  4 months Post-Operative RYGB Surgery  Medical Nutrition Therapy:  Appt start time: 200 end time: 245  Primary concerns today: Post-operative Bariatric Surgery Nutrition Management. Shavelle returns having lost another 23 pounds. She has been in the hospital for E. Coli and sepsis due to a kidney infection. Feeling much better since discharge. She feels like she goes overboard with her foods during lunch.   Surgery date: 11/25/2014 Surgery type: RYGB Start weight at [facility]: 326.5 on 11/08/13 Weight today: 269 lbs Weight change: 23 lbs Total weight lost: 57.5 lbs  TANITA  BODY COMP RESULTS  10/07/14 12/24/14 02/03/15 03/31/15   BMI (kg/m^2) 54.1 51.1 48.6 44.8   Fat Mass (lbs) 189 171.5 154.5 137.5   Fat Free Mass (lbs) 136 135.5 137.5 131.5   Total Body Water (lbs) 99.5 99.0 100.5 96.5    Preferred Learning Style:   No preference indicated   Learning Readiness:   Ready  24-hr recall: B (AM): Premier protein shake (30g) Snk (AM): yogurt and water (12g)  L (PM): Premier protein shake (30g) Snk (PM):  Babybel cheese with 4-5 pita chips or wheat thins, sometimes hummus D (PM): 2 oz meat and a vegetable (14g) Snk (PM):   Fluid intake: 48 oz water, unsweet tea with Stevia, 11-22 oz protein shake (about 64 oz) Estimated total protein intake: 86g  Medications: no longer taking Metformin, blood pressure, or cholesterol meds Supplementation: taking  CBG monitoring: not testing Average CBG per patient:  Last patient reported A1c: 6.4%  Using straws: no Drinking while eating: yes, patient states she knows this is not recommended Hair loss: none Carbonated beverages: none N/V/D/C: some constipation, using glycerin suppositories about 1x every 3 weeks Dumping syndrome: 1x at a [employer] when she ate too much (vomiting and diarrhea)  Recent physical activity:  Physical therapy; getting ready to start at [facility]):  In  progress.  Handouts given during visit include:  Bariatric snack ideas   Nutritional Diagnosis:  [place]-3.3 Overweight/obesity related to past poor dietary habits and physical inactivity as evidenced by patient w/ recent RYGB surgery following dietary guidelines for continued weight loss.     Intervention:  Nutrition counseling provided.  Teaching Method Utilized:  Visual Auditory Hands on  Barriers to learning/adherence to lifestyle change: none  Demonstrated degree of understanding via:  Teach Back   Monitoring/Evaluation:  Dietary intake, exercise, and body weight. Follow up in 2 months for 6 month post-op visit.

## 2015-05-27 ENCOUNTER — Ambulatory Visit: Payer: Medicare Other | Admitting: Podiatry

## 2015-05-30 DIAGNOSIS — Z6841 Body Mass Index (BMI) 40.0 and over, adult: Secondary | ICD-10-CM | POA: Diagnosis not present

## 2015-05-30 DIAGNOSIS — M5442 Lumbago with sciatica, left side: Secondary | ICD-10-CM | POA: Diagnosis not present

## 2015-05-30 DIAGNOSIS — M5441 Lumbago with sciatica, right side: Secondary | ICD-10-CM | POA: Diagnosis not present

## 2015-05-30 DIAGNOSIS — I10 Essential (primary) hypertension: Secondary | ICD-10-CM | POA: Diagnosis not present

## 2015-06-02 ENCOUNTER — Ambulatory Visit: Payer: Self-pay | Admitting: Dietician

## 2015-06-11 DIAGNOSIS — M5126 Other intervertebral disc displacement, lumbar region: Secondary | ICD-10-CM | POA: Diagnosis not present

## 2015-06-11 DIAGNOSIS — M5441 Lumbago with sciatica, right side: Secondary | ICD-10-CM | POA: Diagnosis not present

## 2015-06-17 DIAGNOSIS — F250 Schizoaffective disorder, bipolar type: Secondary | ICD-10-CM | POA: Diagnosis not present

## 2015-07-10 ENCOUNTER — Encounter (HOSPITAL_COMMUNITY): Payer: Self-pay | Admitting: *Deleted

## 2015-07-10 ENCOUNTER — Emergency Department (HOSPITAL_COMMUNITY)
Admission: EM | Admit: 2015-07-10 | Discharge: 2015-07-10 | Disposition: A | Discharge status: Home / Self Care | Payer: Medicare Other | Attending: Emergency Medicine | Admitting: Emergency Medicine

## 2015-07-10 DIAGNOSIS — Z96651 Presence of right artificial knee joint: Secondary | ICD-10-CM | POA: Insufficient documentation

## 2015-07-10 DIAGNOSIS — N390 Urinary tract infection, site not specified: Secondary | ICD-10-CM | POA: Diagnosis not present

## 2015-07-10 DIAGNOSIS — E119 Type 2 diabetes mellitus without complications: Secondary | ICD-10-CM | POA: Insufficient documentation

## 2015-07-10 DIAGNOSIS — Z8669 Personal history of other diseases of the nervous system and sense organs: Secondary | ICD-10-CM | POA: Diagnosis not present

## 2015-07-10 DIAGNOSIS — Z8719 Personal history of other diseases of the digestive system: Secondary | ICD-10-CM | POA: Insufficient documentation

## 2015-07-10 DIAGNOSIS — J45909 Unspecified asthma, uncomplicated: Secondary | ICD-10-CM | POA: Insufficient documentation

## 2015-07-10 DIAGNOSIS — Z79899 Other long term (current) drug therapy: Secondary | ICD-10-CM | POA: Diagnosis not present

## 2015-07-10 DIAGNOSIS — E039 Hypothyroidism, unspecified: Secondary | ICD-10-CM | POA: Diagnosis not present

## 2015-07-10 DIAGNOSIS — I10 Essential (primary) hypertension: Secondary | ICD-10-CM | POA: Diagnosis not present

## 2015-07-10 DIAGNOSIS — M1990 Unspecified osteoarthritis, unspecified site: Secondary | ICD-10-CM | POA: Insufficient documentation

## 2015-07-10 DIAGNOSIS — Z8701 Personal history of pneumonia (recurrent): Secondary | ICD-10-CM | POA: Insufficient documentation

## 2015-07-10 DIAGNOSIS — Z87891 Personal history of nicotine dependence: Secondary | ICD-10-CM | POA: Insufficient documentation

## 2015-07-10 DIAGNOSIS — F419 Anxiety disorder, unspecified: Secondary | ICD-10-CM | POA: Insufficient documentation

## 2015-07-10 DIAGNOSIS — F319 Bipolar disorder, unspecified: Secondary | ICD-10-CM | POA: Insufficient documentation

## 2015-07-10 DIAGNOSIS — R350 Frequency of micturition: Secondary | ICD-10-CM | POA: Diagnosis present

## 2015-07-10 LAB — URINALYSIS, ROUTINE W REFLEX MICROSCOPIC
Bilirubin Urine: NEGATIVE
Glucose, UA: NEGATIVE mg/dL
Ketones, ur: 15 mg/dL — AB
Leukocytes, UA: NEGATIVE
Nitrite: NEGATIVE
Protein, ur: NEGATIVE mg/dL
Specific Gravity, Urine: 1.014 (ref 1.005–1.030)
pH: 8.5 — ABNORMAL HIGH (ref 5.0–8.0)

## 2015-07-10 LAB — URINE MICROSCOPIC-ADD ON

## 2015-07-10 MED ORDER — PHENAZOPYRIDINE HCL 100 MG PO TABS
100.0000 mg | ORAL_TABLET | Freq: Once | ORAL | Status: AC
  Administered 2015-07-10: 100 mg via ORAL
  Filled 2015-07-10: qty 1

## 2015-07-10 MED ORDER — ONDANSETRON HCL 4 MG/2ML IJ SOLN
4.0000 mg | Freq: Once | INTRAMUSCULAR | Status: AC
  Administered 2015-07-10: 4 mg via INTRAVENOUS
  Filled 2015-07-10: qty 2

## 2015-07-10 MED ORDER — CIPROFLOXACIN HCL 500 MG PO TABS
500.0000 mg | ORAL_TABLET | Freq: Two times a day (BID) | ORAL | Status: DC
Start: 2015-07-10 — End: 2016-01-26

## 2015-07-10 MED ORDER — ACETAMINOPHEN 325 MG PO TABS
650.0000 mg | ORAL_TABLET | Freq: Once | ORAL | Status: AC
Start: 2015-07-10 — End: 2015-07-10
  Administered 2015-07-10: 650 mg via ORAL
  Filled 2015-07-10: qty 2

## 2015-07-10 MED ORDER — CIPROFLOXACIN HCL 500 MG PO TABS
500.0000 mg | ORAL_TABLET | Freq: Once | ORAL | Status: AC
  Administered 2015-07-10: 500 mg via ORAL
  Filled 2015-07-10: qty 1

## 2015-07-10 MED ORDER — PHENAZOPYRIDINE HCL 200 MG PO TABS: 200.0000 mg | ORAL_TABLET | Freq: Three times a day (TID) | ORAL | Status: DC

## 2015-07-10 NOTE — ED Provider Notes (Signed)
CSN: [id number]     Arrival date & time 07/10/15  0705 History   First MD Initiated Contact with Patient 07/10/15 [phone]     Chief Complaint  Patient presents with  . Urinary Frequency      HPI  She presents for evaluation of dysuria. Urinary frequency or gross hematuria and suprapubic pain for the last 24 hours. Had UTIs in September of last year. Patient does report some back pain and nausea. No fever shakes chills.  Symptoms onset yesterday.  Past Medical History  Diagnosis Date  . History of knee replacement, total   . Fever   . Breast lump   . Breast discharge   . Breast pain   . Diabetes mellitus   . Thyroid disease   . Asthma   . Arthritis   . Depression   . Hypertension   . Bipolar 1 disorder ([place])   . Hypothyroidism   . Sleep apnea   . Graves disease   . Graves disease   . Pneumonia     2015,2014  . Anxiety   . GERD (gastroesophageal reflux disease)    Past Surgical History  Procedure Laterality Date  . Cesarean section    . Breast lumpectomy      right  . Hernia repair    . Appendectomy    . Abdominal hysterectomy      partial  . Knee surgery      right  . Myomectomy abdominal approach  1989  . Myomectomy abdominal approach  1989  . Cardiac catheterization      no significant CAD, nl LV function by 11/08/06 cath  . Total knee revision  06/21/2012    Procedure: TOTAL KNEE REVISION;  Surgeon: Newt Minion, MD;  Location: [facility];  Service: Orthopedics;  Laterality: Right;  Revision Right Total Knee Arthroplasty  . Gastric roux-en-y N/A 11/25/2014    Procedure: LAPAROSCOPIC ROUX-EN-Y GASTRIC BYPASS WITH UPPER ENDOSCOPY;  Surgeon: Johnathan Hausen, MD;  Location: [facility];  Service: General;  Laterality: N/A;   Family History  Problem Relation Age of Onset  . Cancer Father     colon  . Stroke Father   . Heart disease Father   . Diabetes Father   . Hypertension Father   . Depression Sister   . Hypertension Mother    Social History  Substance Use Topics    . Smoking status: Former Smoker -- 0.50 packs/day for 4 years    Types: Cigarettes    Quit date: 09/13/2014  . Smokeless tobacco: Never Used  . Alcohol Use: No   OB History    No data available     Review of Systems  Constitutional: Negative for fever, chills, diaphoresis, appetite change and fatigue.  HENT: Negative for mouth sores, sore throat and trouble swallowing.   Eyes: Negative for visual disturbance.  Respiratory: Negative for cough, chest tightness, shortness of breath and wheezing.   Cardiovascular: Negative for chest pain.  Gastrointestinal: Negative for nausea, vomiting, abdominal pain, diarrhea and abdominal distention.  Endocrine: Negative for polydipsia, polyphagia and polyuria.  Genitourinary: Positive for dysuria, frequency and hematuria.  Musculoskeletal: Negative for gait problem.  Skin: Negative for color change, pallor and rash.  Neurological: Negative for dizziness, syncope, light-headedness and headaches.  Hematological: Does not bruise/bleed easily.  Psychiatric/Behavioral: Negative for behavioral problems and confusion.      Allergies  Aspirin; Sulfa antibiotics; and Other  Home Medications   Prior to Admission medications   Medication Sig Start Date  End Date Taking? Authorizing Provider  ADVAIR DISKUS 500-50 MCG/DOSE AEPB Inhale 1 puff into the lungs 2 (two) times daily as needed (shortness of breath, wheezing).  12/05/13   Historical Provider, MD  albuterol (PROVENTIL HFA;VENTOLIN HFA) 108 (90 BASE) MCG/ACT inhaler Inhale 2 puffs into the lungs every 6 (six) hours as needed for wheezing or shortness of breath. Shortness of breath    Historical Provider, MD  ALPRAZolam Duanne Moron) 1 MG tablet Take 1 mg by mouth 4 (four) times daily.  07/02/14   Historical Provider, MD  ciprofloxacin (CIPRO) 500 MG tablet Take 1 tablet (500 mg total) by mouth every 12 (twelve) hours. 07/10/15   Tanna Furry, MD  citalopram (CELEXA) 20 MG tablet Take 20 mg by mouth daily.  01/21/15   Historical Provider, MD  Cyanocobalamin (B-12 PO) Take 1 tablet by mouth every morning.     Historical Provider, MD  EPINEPHrine 0.3 mg/0.3 mL IJ SOAJ injection Inject 0.3 mg as directed as needed (severe allergic reaction).  01/11/14   Historical Provider, MD  fluticasone (FLONASE) 50 MCG/ACT nasal spray Place 2 sprays into the nose daily as needed (congestion).    Historical Provider, MD  gabapentin (NEURONTIN) 600 MG tablet Take 600 mg by mouth 2 (two) times daily.    Historical Provider, MD  levocetirizine (XYZAL) 5 MG tablet Take 5 mg by mouth daily as needed for allergies.  06/29/14   Historical Provider, MD  levothyroxine (SYNTHROID, LEVOTHROID) 125 MCG tablet Take 125 mcg by mouth every morning.     Historical Provider, MD  Lidocaine 0.5 % GEL Apply 1 application topically 2 (two) times daily as needed (knee pain.).     Historical Provider, MD  magnesium hydroxide (MILK OF MAGNESIA) 400 MG/5ML suspension Take 15 mLs by mouth daily as needed for mild constipation.    Historical Provider, MD  methylphenidate (RITALIN) 20 MG tablet Take 20 mg by mouth 2 (two) times daily with breakfast and lunch.  06/14/13   Historical Provider, MD  morphine (MS CONTIN) 15 MG 12 hr tablet Take 1 tablet (15 mg total) by mouth every 12 (twelve) hours. 11/14/14   Bayard Hugger, NP  Multiple Vitamin (MULTIVITAMIN) tablet Take 1 tablet by mouth every morning.     Historical Provider, MD  nabumetone (RELAFEN) 750 MG tablet Take 750 mg by mouth 2 (two) times daily as needed for mild pain.  10/04/13   Historical Provider, MD  Oxycodone HCl 10 MG TABS Take 1 tablet (10 mg total) by mouth every 6 (six) hours as needed (for pain). 11/14/14   Bayard Hugger, NP  phenazopyridine (PYRIDIUM) 200 MG tablet Take 1 tablet (200 mg total) by mouth 3 (three) times daily. 07/10/15   Tanna Furry, MD  potassium chloride 20 MEQ/15ML (10%) SOLN Take 7.5 mLs (10 mEq total) by mouth daily. 03/13/15 03/17/15  Robbie Lis, MD  promethazine  (PHENERGAN) 25 MG tablet Take 1 tablet (25 mg total) by mouth every 6 (six) hours as needed for nausea or vomiting. 03/13/15   Robbie Lis, MD  QUEtiapine (SEROQUEL) 100 MG tablet Take 100 mg by mouth at bedtime.  04/09/14   Historical Provider, MD  REXULTI 2 MG TABS Take 2 mg by mouth at bedtime.  06/18/14   Historical Provider, MD  VOLTAREN 1 % GEL Apply 1 application topically 4 (four) times daily as needed (pain).  01/23/15   Historical Provider, MD  zolpidem (AMBIEN) 10 MG tablet Take 10 mg by mouth at bedtime.  09/07/14   Historical Provider, MD   BP 168/105 mmHg  Pulse 63  Temp(Src) 97.8 F (36.6 C) (Oral)  Resp 18  SpO2 98% Physical Exam  Constitutional: She is oriented to person, place, and time. She appears well-developed and well-nourished. No distress.  HENT:  Head: Normocephalic.  Eyes: Conjunctivae are normal. Pupils are equal, round, and reactive to light. No scleral icterus.  Neck: Normal range of motion. Neck supple. No thyromegaly present.  Cardiovascular: Normal rate and regular rhythm.  Exam reveals no gallop and no friction rub.   No murmur heard. Pulmonary/Chest: Effort normal and breath sounds normal. No respiratory distress. She has no wheezes. She has no rales.  Abdominal: Soft. Bowel sounds are normal. She exhibits no distension. There is no tenderness. There is no rebound.    Musculoskeletal: Normal range of motion.       Back:  Neurological: She is alert and oriented to person, place, and time.  Skin: Skin is warm and dry. No rash noted.  Psychiatric: She has a normal mood and affect. Her behavior is normal.    ED Course  Procedures (including critical care time) Labs Review Labs Reviewed  URINALYSIS, ROUTINE W REFLEX MICROSCOPIC (NOT AT [facility]) - Abnormal; Notable for the following:    APPearance TURBID (*)    pH 8.5 (*)    Hgb urine dipstick SMALL (*)    Ketones, ur 15 (*)    All other components within normal limits  URINE MICROSCOPIC-ADD ON -  Abnormal; Notable for the following:    Squamous Epithelial / LPF 6-30 (*)    Bacteria, UA MANY (*)    All other components within normal limits    Imaging Review No results found. I have personally reviewed and evaluated these images and lab results as part of my medical decision-making.   EKG Interpretation None      MDM   Final diagnoses:  UTI (lower urinary tract infection)   Patient with uncomplicated UTI. Although, does report back pain. No fever. 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 Definitely UTI. Given Cipro. Review of her most recent UTIs September 2016 showed pan sensitive Escherichia coli. Plan Cipro, Pyridium. Primary care follow-up.  Tanna Furry, MD 07/10/15 [phone]

## 2015-07-10 NOTE — ED Notes (Signed)
Pt here with c/o frequency and burning with urination.  Pt also having some low back pain that started yesterday.

## 2015-07-10 NOTE — Discharge Instructions (Signed)

## 2015-07-10 NOTE — ED Notes (Signed)
Pt reports bladder, back pain with urinary frequency and burning since yesterday. Pt also reports some blood in her urine as well.

## 2015-07-21 DIAGNOSIS — Z6841 Body Mass Index (BMI) 40.0 and over, adult: Secondary | ICD-10-CM | POA: Diagnosis not present

## 2015-07-21 DIAGNOSIS — M5441 Lumbago with sciatica, right side: Secondary | ICD-10-CM | POA: Diagnosis not present

## 2015-07-21 DIAGNOSIS — I10 Essential (primary) hypertension: Secondary | ICD-10-CM | POA: Diagnosis not present

## 2015-07-21 DIAGNOSIS — G8929 Other chronic pain: Secondary | ICD-10-CM | POA: Diagnosis not present

## 2015-07-21 DIAGNOSIS — M5442 Lumbago with sciatica, left side: Secondary | ICD-10-CM | POA: Diagnosis not present

## 2015-08-25 DIAGNOSIS — M25561 Pain in right knee: Secondary | ICD-10-CM | POA: Diagnosis not present

## 2015-08-25 DIAGNOSIS — M5442 Lumbago with sciatica, left side: Secondary | ICD-10-CM | POA: Diagnosis not present

## 2015-08-25 DIAGNOSIS — Z6839 Body mass index (BMI) 39.0-39.9, adult: Secondary | ICD-10-CM | POA: Diagnosis not present

## 2015-08-25 DIAGNOSIS — G8929 Other chronic pain: Secondary | ICD-10-CM | POA: Diagnosis not present

## 2015-08-25 DIAGNOSIS — I10 Essential (primary) hypertension: Secondary | ICD-10-CM | POA: Diagnosis not present

## 2015-08-25 DIAGNOSIS — M5441 Lumbago with sciatica, right side: Secondary | ICD-10-CM | POA: Diagnosis not present

## 2015-09-01 DIAGNOSIS — F250 Schizoaffective disorder, bipolar type: Secondary | ICD-10-CM | POA: Diagnosis not present

## 2015-09-02 DIAGNOSIS — E039 Hypothyroidism, unspecified: Secondary | ICD-10-CM | POA: Diagnosis not present

## 2015-09-02 DIAGNOSIS — E785 Hyperlipidemia, unspecified: Secondary | ICD-10-CM | POA: Diagnosis not present

## 2015-09-02 DIAGNOSIS — Z79899 Other long term (current) drug therapy: Secondary | ICD-10-CM | POA: Diagnosis not present

## 2015-09-02 DIAGNOSIS — I10 Essential (primary) hypertension: Secondary | ICD-10-CM | POA: Diagnosis not present

## 2015-09-02 DIAGNOSIS — D473 Essential (hemorrhagic) thrombocythemia: Secondary | ICD-10-CM | POA: Diagnosis not present

## 2015-09-02 DIAGNOSIS — E119 Type 2 diabetes mellitus without complications: Secondary | ICD-10-CM | POA: Diagnosis not present

## 2015-09-05 DIAGNOSIS — B354 Tinea corporis: Secondary | ICD-10-CM | POA: Diagnosis not present

## 2015-09-07 ENCOUNTER — Encounter (HOSPITAL_COMMUNITY): Payer: Self-pay | Admitting: Emergency Medicine

## 2015-09-07 ENCOUNTER — Emergency Department (INDEPENDENT_AMBULATORY_CARE_PROVIDER_SITE_OTHER)
Admission: EM | Admit: 2015-09-07 | Discharge: 2015-09-07 | Disposition: A | Discharge status: Home / Self Care | Payer: Medicare Other | Source: Home / Self Care | Attending: Emergency Medicine | Admitting: Emergency Medicine

## 2015-09-07 DIAGNOSIS — M25561 Pain in right knee: Secondary | ICD-10-CM | POA: Diagnosis not present

## 2015-09-07 MED ORDER — LIDOCAINE 5 % EX PTCH: 1.0000 | MEDICATED_PATCH | CUTANEOUS | Status: DC

## 2015-09-07 MED ORDER — KETOROLAC TROMETHAMINE 60 MG/2ML IM SOLN
INTRAMUSCULAR | Status: AC
  Filled 2015-09-07: qty 2

## 2015-09-07 MED ORDER — KETOROLAC TROMETHAMINE 60 MG/2ML IM SOLN
60.0000 mg | Freq: Once | INTRAMUSCULAR | Status: AC
  Administered 2015-09-07: 60 mg via INTRAMUSCULAR

## 2015-09-07 NOTE — ED Notes (Signed)
The patient presented to the [facility] with a complaint of right knee pain secondary to a fall that occurred 2 years prior. The patient has a hx of a knee replacement and 1 revision on her right knee.
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
## 2015-09-07 NOTE — Discharge Instructions (Signed)
I'm sorry you've been having so much trouble with your knee. Hopefully, Dr. Alvan Dame will have some new ideas. We gave you a Toradol shot today to help. I also sent a prescription for Lidoderm patches to use daily on your knee. Follow-up as needed.

## 2015-09-09 ENCOUNTER — Encounter: Payer: Self-pay | Admitting: Dietician

## 2015-09-09 ENCOUNTER — Encounter: Payer: Medicare Other | Attending: Surgery | Admitting: Dietician

## 2015-09-09 DIAGNOSIS — Z029 Encounter for administrative examinations, unspecified: Secondary | ICD-10-CM | POA: Diagnosis present

## 2015-09-09 NOTE — Patient Instructions (Addendum)
Goals:  Follow Phase 3B: High Protein + Non-Starchy Vegetables  Eat 3-6 small meals/snacks, every 3-5 hrs  Increase lean protein foods to meet 60g goal  Increase fluid intake to 64oz +  Avoid drinking 15 minutes before, during and 30 minutes after eating  Aim for >30 min of physical activity daily  Remember that you need to eat to lose weight  Start phasing out protein shakes and add real food  Try raw veggies with hummus instead of pita chips   Try cheese chips: Pour a heaping tablespoon of Parmesan onto a silicone or parchment lined baking sheet and lightly pat down. A silicone baking sheet is highly recommended. Repeat with the remaining cheese, spacing the spoonfuls about a 1/2 inch apart. Bake for 3 to 5 minutes or until golden and crisp.  Try [employer] protein chips for a crunch  Try mixing fat free Greek yogurt with [employer]  Try egg/chicken salad with plain greek yogurt, mustard, and pickle relish  -Continue to maintain a BALANCE! Most of the time, eat lean protein and then vegetables and then starch if you have room -Always have a protein food every time you eat!   Surgery date: 11/25/2014 Surgery type: RYGB Start weight at [facility]: 326.5 on 11/08/13 (330 lbs on day of surgery per patient) Weight today: 241.5 lbs Weight change: 27.5 lbs Total weight lost: 88.5 lbs  TANITA  BODY COMP RESULTS  10/07/14 12/24/14 02/03/15 03/31/15 09/09/15   BMI (kg/m^2) 54.1 51.1 48.6 44.8 40.2   Fat Mass (lbs) 189 171.5 154.5 137.5 115   Fat Free Mass (lbs) 136 135.5 137.5 131.5 126.5   Total Body Water (lbs) 99.5 99.0 100.5 96.5 92.5

## 2015-09-09 NOTE — Progress Notes (Signed)
  Follow-up visit:  9 months Post-Operative RYGB Surgery  Medical Nutrition Therapy:  Appt start time: 1010 end time: 1050  Primary concerns today: Post-operative Bariatric Surgery Nutrition Management. Caroline Williams returns having lost another 27.5 pounds. Still sees psychiatrist as needed. Notices that she is more compliant with medications overall. "My body is changing so fast that it's hard to keep up mentally." However, she notes that her self esteem has improved and she thinks more positively. Having yeast infections under breasts and still having a lot of knee pain. Sometimes has "fallen off the wagon" nutritionally but "always gets back on." Notices that she is less tired and more active overall. Sleeping better and does not snore anymore; has not had another sleep study. Having some fruit and tolerates but has dumping with candy. Does not tolerate beef. Still has high blood pressure and high cholesterol. Still using protein shakes to supplement protein intake.   Surgery date: 11/25/2014 Surgery type: RYGB Start weight at [facility]: 326.5 on 11/08/13 (330 lbs on day of surgery per patient) Weight today: 241.5 lbs Weight change: 27.5 lbs Total weight lost: 88.5 lbs  Goal weight: 190 lbs   TANITA  BODY COMP RESULTS  10/07/14 12/24/14 02/03/15 03/31/15 09/09/15   BMI (kg/m^2) 54.1 51.1 48.6 44.8 40.2   Fat Mass (lbs) 189 171.5 154.5 137.5 115   Fat Free Mass (lbs) 136 135.5 137.5 131.5 126.5   Total Body Water (lbs) 99.5 99.0 100.5 96.5 92.5    Preferred Learning Style:   No preference indicated   Learning Readiness:   Ready  24-hr recall:  Still has 1-2 protein shakes per day  B (6 AM): banana or Light and Fit [place] yogurt (0-12g) Snk (10:30 AM): 3-4 oz baked chicken or pork chop with vegetables  L (1-2 PM): Laughing Cow cheese with 4 triscuits Snk (PM):   D (6PM): 3-4 oz chicken or fish with vegetables Snk (PM):   Fluid intake: 48 oz water, unsweet tea with Stevia, 11-22 oz protein  shake (about 64 oz) Estimated total protein intake: 86g  Medications: no longer taking Metformin, blood pressure, or cholesterol meds Supplementation: taking  CBG monitoring: not testing Average CBG per patient:  Last patient reported A1c: 6.1%  Using straws: no Drinking while eating: yes, patient states she knows this is not recommended Hair loss: none Carbonated beverages: none N/V/D/C: some constipation, using glycerin suppositories about 1x every 3 weeks Dumping syndrome: yes if she eats something "that does not belong in my body"  Recent physical activity:  [employer], inconsistent; 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  Handouts given during visit include:  none   Nutritional Diagnosis:  Central-3.3 Overweight/obesity related to past poor dietary habits and physical inactivity as evidenced by patient w/ recent RYGB surgery following dietary guidelines for continued weight loss.     Intervention:  Nutrition counseling provided.  Teaching Method Utilized:  Visual Auditory Hands on  Barriers to learning/adherence to lifestyle change: none  Demonstrated degree of understanding via:  Teach Back   Monitoring/Evaluation:  Dietary intake, exercise, and body weight. Follow up in 3 months for 12 month post-op visit.

## 2015-09-17 DIAGNOSIS — F250 Schizoaffective disorder, bipolar type: Secondary | ICD-10-CM | POA: Diagnosis not present

## 2015-09-25 DIAGNOSIS — M25561 Pain in right knee: Secondary | ICD-10-CM | POA: Diagnosis not present

## 2015-09-25 DIAGNOSIS — I10 Essential (primary) hypertension: Secondary | ICD-10-CM | POA: Diagnosis not present

## 2015-09-25 DIAGNOSIS — M5441 Lumbago with sciatica, right side: Secondary | ICD-10-CM | POA: Diagnosis not present

## 2015-09-25 DIAGNOSIS — Z6841 Body Mass Index (BMI) 40.0 and over, adult: Secondary | ICD-10-CM | POA: Diagnosis not present

## 2015-09-25 DIAGNOSIS — M5442 Lumbago with sciatica, left side: Secondary | ICD-10-CM | POA: Diagnosis not present

## 2015-09-25 DIAGNOSIS — G8929 Other chronic pain: Secondary | ICD-10-CM | POA: Diagnosis not present

## 2015-09-29 DIAGNOSIS — F250 Schizoaffective disorder, bipolar type: Secondary | ICD-10-CM | POA: Diagnosis not present

## 2015-10-08 DIAGNOSIS — F250 Schizoaffective disorder, bipolar type: Secondary | ICD-10-CM | POA: Diagnosis not present

## 2015-11-01 ENCOUNTER — Ambulatory Visit (HOSPITAL_COMMUNITY)
Admission: EM | Admit: 2015-11-01 | Discharge: 2015-11-01 | Disposition: A | Discharge status: Home / Self Care | Payer: Medicare Other | Attending: Emergency Medicine | Admitting: Emergency Medicine

## 2015-11-01 ENCOUNTER — Encounter (HOSPITAL_COMMUNITY): Payer: Self-pay | Admitting: Emergency Medicine

## 2015-11-01 DIAGNOSIS — H539 Unspecified visual disturbance: Secondary | ICD-10-CM

## 2015-11-01 DIAGNOSIS — R51 Headache: Secondary | ICD-10-CM

## 2015-11-01 DIAGNOSIS — E669 Obesity, unspecified: Secondary | ICD-10-CM | POA: Diagnosis not present

## 2015-11-01 DIAGNOSIS — Z8639 Personal history of other endocrine, nutritional and metabolic disease: Secondary | ICD-10-CM

## 2015-11-01 DIAGNOSIS — R519 Headache, unspecified: Secondary | ICD-10-CM

## 2015-11-01 NOTE — Discharge Instructions (Signed)
General Headache Without Cause A headache is pain or discomfort felt around the head or neck area. There are many causes and types of headaches. In some cases, the cause may not be found.  HOME CARE  Managing Pain  Take over-the-counter and prescription medicines only as told by your doctor.  Lie down in a dark, quiet room when you have a headache.  If directed, apply ice to the head and neck area:  Put ice in a plastic bag.  Place a towel between your skin and the bag.  Leave the ice on for 20 minutes, 2-3 times per day.  Use a heating pad or hot shower to apply heat to the head and neck area as told by your doctor.  Keep lights dim if bright lights bother you or make your headaches worse. Eating and Drinking  Eat meals on a regular schedule.  Lessen how much alcohol you drink.  Lessen how much caffeine you drink, or stop drinking caffeine. General Instructions  Keep all follow-up visits as told by your doctor. This is important.  Keep a journal to find out if certain things bring on headaches. For example, write down:  What you eat and drink.  How much sleep you get.  Any change to your diet or medicines.  Relax by getting a massage or doing other relaxing activities.  Lessen stress.  Sit up straight. Do not tighten (tense) your muscles.  Do not use tobacco products. This includes cigarettes, chewing tobacco, or e-cigarettes. If you need help quitting, ask your doctor.  Exercise regularly as told by your doctor.  Get enough sleep. This often means 7-9 hours of sleep. GET HELP IF: Your headaches get worse, you have problems with vision, speech, hearing, swallowing, additional weakness or numbness, vomiting, unusually sleepy, lethargic, loss of function of any part of your body, urinary incontinence or loss of control of your bowel or bladder you should call 911.  Your symptoms are not helped by medicine.  You have a headache that feels different than the other  headaches.  You feel sick to your stomach (nauseous) or you throw up (vomit).  You have a fever. GET HELP RIGHT AWAY IF:   Your headache becomes really bad.  You keep throwing up.  You have a stiff neck.  You have trouble seeing.  You have trouble speaking.  You have pain in the eye or ear.  Your muscles are weak or you lose muscle control.  You lose your balance or have trouble walking.  You feel like you will pass out (faint) or you pass out.  You have confusion.   This information is not intended to replace advice given to you by your health care provider. Make sure you discuss any questions you have with your health care provider.   Document Released: 03/09/2008 Document Revised: 02/19/2015 Document Reviewed: 09/23/2014 Elsevier Interactive Patient Education [employer].

## 2015-11-01 NOTE — ED Notes (Addendum)
Patient reports headache started approx 4:00 pm- "something upset her".  Approximately 15 minutes ago, patient said she had vision changes.  Patient reports black spots interfere with vision, continues to have spots in field of vision.  Patient will say she is weak on right side, but this has been for several months.  Patient continues to complain about headache, but not the worst headache she has had.

## 2015-11-01 NOTE — ED Provider Notes (Signed)
CSN: [id number]     Arrival date & time 11/01/15  1742 History   First MD Initiated Contact with Patient 11/01/15 1755     Chief Complaint  Patient presents with  . Headache  . Visual Field Change   (Consider location/radiation/quality/duration/timing/severity/associated sxs/prior Treatment) HPI Comments: PT 55-year-old female states that around 4:00 this afternoon she had a very sudden attack of a frontal headache. She does not have a history of headaches and this is new for her. She rates the intensity of pain a 7 out of 10 in the urgent care. Several minutes prior to arrival she developed what she describes as black spots and a partial visual field decrease and/or walls from the right eye. She continues to have difficulty in focusing her vision. She also endorses right upper extremity weakness that has been occurring and getting a little worse over the past 2 weeks. Denies problems with speech, hearing or swallowing. Denies focal weakness. Denies chest pain or shortness of breath. Denies photophobia, nausea or vomiting. She walks with a cane primarily due to history of right knee surgery and subsequent pain and leg weakness.  She has a history of morbid obesity and gastric bypass surgery. Prior to the surgery she had a long history of diabetes mellitus, hypertension, bipolar 1 disorder and thyroid disease. She had a right total knee revision and 2014.   Past Medical History  Diagnosis Date  . History of knee replacement, total   . Fever   . Breast lump   . Breast discharge   . Breast pain   . Diabetes mellitus   . Thyroid disease   . Asthma   . Arthritis   . Depression   . Hypertension   . Bipolar 1 disorder ([place])   . Hypothyroidism   . Sleep apnea   . Graves disease   . Graves disease   . Pneumonia     2015,2014  . Anxiety   . GERD (gastroesophageal reflux disease)    Past Surgical History  Procedure Laterality Date  . Cesarean section    . Breast lumpectomy      right   . Hernia repair    . Appendectomy    . Abdominal hysterectomy      partial  . Knee surgery      right  . Myomectomy abdominal approach  1989  . Myomectomy abdominal approach  1989  . Cardiac catheterization      no significant CAD, nl LV function by 11/08/06 cath  . Total knee revision  06/21/2012    Procedure: TOTAL KNEE REVISION;  Surgeon: Newt Minion, MD;  Location: [facility];  Service: Orthopedics;  Laterality: Right;  Revision Right Total Knee Arthroplasty  . Gastric roux-en-y N/A 11/25/2014    Procedure: LAPAROSCOPIC ROUX-EN-Y GASTRIC BYPASS WITH UPPER ENDOSCOPY;  Surgeon: Johnathan Hausen, MD;  Location: [facility];  Service: General;  Laterality: N/A;   Family History  Problem Relation Age of Onset  . Cancer Father     colon  . Stroke Father   . Heart disease Father   . Diabetes Father   . Hypertension Father   . Depression Sister   . Hypertension Mother    Social History  Substance Use Topics  . Smoking status: Former Smoker -- 0.50 packs/day for 4 years    Types: Cigarettes    Quit date: 09/13/2014  . Smokeless tobacco: Never Used  . Alcohol Use: No   OB History    No data available  Review of Systems  Constitutional: Negative for fever, diaphoresis, activity change and fatigue.  HENT: Negative for congestion, ear pain, hearing loss, nosebleeds, sinus pressure, sore throat and trouble swallowing.   Eyes: Positive for visual disturbance. Negative for photophobia, pain and redness.       Peripheral vision in both eyes appear to be normal. She still is complaining of black spots of the right in the right peripheral field of vision.  Respiratory: Negative for cough, chest tightness and shortness of breath.   Cardiovascular: Negative for chest pain and leg swelling.  Gastrointestinal: Negative.  Negative for nausea and vomiting.  Genitourinary: Negative.   Musculoskeletal:       Right upper extremity strength 5 over 5 in the left upper extremity 4/5 on the right upper  extremity. Lower extremity strength is left is 4 out of 5 right difficult to test due to previous knee surgery and pre-existing pain and weakness.  Skin: Negative.   Neurological: Positive for weakness and headaches. Negative for dizziness, syncope, facial asymmetry and speech difficulty.    Allergies  Aspirin; Sulfa antibiotics; and Other  Home Medications   Prior to Admission medications   Medication Sig Start Date End Date Taking? Authorizing Provider  ADVAIR DISKUS 500-50 MCG/DOSE AEPB Inhale 1 puff into the lungs 2 (two) times daily as needed (shortness of breath, wheezing).  12/05/13   Historical Provider, MD  albuterol (PROVENTIL HFA;VENTOLIN HFA) 108 (90 BASE) MCG/ACT inhaler Inhale 2 puffs into the lungs every 6 (six) hours as needed for wheezing or shortness of breath. Shortness of breath    Historical Provider, MD  ALPRAZolam Duanne Moron) 1 MG tablet Take 1 mg by mouth 4 (four) times daily.  07/02/14   Historical Provider, MD  ciprofloxacin (CIPRO) 500 MG tablet Take 1 tablet (500 mg total) by mouth every 12 (twelve) hours. Patient not taking: Reported on 11/01/2015 07/10/15   Tanna Furry, MD  citalopram (CELEXA) 20 MG tablet Take 20 mg by mouth daily. 01/21/15   Historical Provider, MD  Cyanocobalamin (B-12 PO) Take 1 tablet by mouth every morning.     Historical Provider, MD  EPINEPHrine 0.3 mg/0.3 mL IJ SOAJ injection Inject 0.3 mg as directed as needed (severe allergic reaction).  01/11/14   Historical Provider, MD  fluticasone (FLONASE) 50 MCG/ACT nasal spray Place 2 sprays into the nose daily as needed (congestion).    Historical Provider, MD  gabapentin (NEURONTIN) 600 MG tablet Take 600 mg by mouth 2 (two) times daily.    Historical Provider, MD  levocetirizine (XYZAL) 5 MG tablet Take 5 mg by mouth daily as needed for allergies.  06/29/14   Historical Provider, MD  levothyroxine (SYNTHROID, LEVOTHROID) 125 MCG tablet Take 125 mcg by mouth every morning.     Historical Provider, MD   lidocaine (LIDODERM) 5 % Place 1 patch onto the skin daily. Remove & Discard patch within 12 hours or as directed by MD 09/07/15   Melony Overly, MD  magnesium hydroxide (MILK OF MAGNESIA) 400 MG/5ML suspension Take 15 mLs by mouth daily as needed for mild constipation.    Historical Provider, MD  methylphenidate (RITALIN) 20 MG tablet Take 20 mg by mouth 2 (two) times daily with breakfast and lunch.  06/14/13   Historical Provider, MD  morphine (MS CONTIN) 15 MG 12 hr tablet Take 1 tablet (15 mg total) by mouth every 12 (twelve) hours. 11/14/14   Bayard Hugger, NP  Multiple Vitamin (MULTIVITAMIN) tablet Take 1 tablet by mouth every  morning.     Historical Provider, MD  nabumetone (RELAFEN) 750 MG tablet Take 750 mg by mouth 2 (two) times daily as needed for mild pain.  10/04/13   Historical Provider, MD  Oxycodone HCl 10 MG TABS Take 1 tablet (10 mg total) by mouth every 6 (six) hours as needed (for pain). 11/14/14   Bayard Hugger, NP  phenazopyridine (PYRIDIUM) 200 MG tablet Take 1 tablet (200 mg total) by mouth 3 (three) times daily. 07/10/15   Tanna Furry, MD  potassium chloride 20 MEQ/15ML (10%) SOLN Take 7.5 mLs (10 mEq total) by mouth daily. 03/13/15 03/17/15  Robbie Lis, MD  promethazine (PHENERGAN) 25 MG tablet Take 1 tablet (25 mg total) by mouth every 6 (six) hours as needed for nausea or vomiting. 03/13/15   Robbie Lis, MD  QUEtiapine (SEROQUEL) 100 MG tablet Take 100 mg by mouth at bedtime.  04/09/14   Historical Provider, MD  REXULTI 2 MG TABS Take 2 mg by mouth at bedtime.  06/18/14   Historical Provider, MD  VOLTAREN 1 % GEL Apply 1 application topically 4 (four) times daily as needed (pain).  01/23/15   Historical Provider, MD  zolpidem (AMBIEN) 10 MG tablet Take 10 mg by mouth at bedtime.  09/07/14   Historical Provider, MD   Meds Ordered and Administered this Visit  Medications - No data to display  BP 175/103 mmHg  Pulse 96  Temp(Src) 98.1 F (36.7 C) (Oral)  Resp 24  SpO2  97% No data found.   Physical Exam  Constitutional: She is oriented to person, place, and time. She appears well-developed and well-nourished. No distress.  Obese  HENT:  Head: Normocephalic and atraumatic.  Mouth/Throat: No oropharyngeal exudate.  Bilateral TMs are normal. Soft palate rises symmetrically. Tongue and uvula midline. Swallowing reflex normal. Normal motor function and control of the tongue  Eyes: Conjunctivae and EOM are normal. Pupils are equal, round, and reactive to light.  Peripheral vision in both eyes appear to be normal. She still is complaining of black spots of the right in the right peripheral field of vision.   Neck: Normal range of motion. Neck supple.  Cardiovascular: Normal rate, regular rhythm and normal heart sounds.   Pulmonary/Chest: Effort normal and breath sounds normal. No respiratory distress. She has no wheezes.  Abdominal: Soft. There is no tenderness.  Musculoskeletal: She exhibits no edema or tenderness.  Right upper extremity strength 5 over 5 in the left upper extremity 4/5 on the right upper extremity. Lower extremity strength  left is 4 out of 5; right difficult to test due to previous knee surgery and pre-existing pain and weakness.   Lymphadenopathy:    She has no cervical adenopathy.  Neurological: She is alert and oriented to person, place, and time. She exhibits normal muscle tone.  Skin: Skin is warm and dry.  Psychiatric: She has a normal mood and affect.  Nursing note and vitals reviewed.   ED Course  Procedures (including critical care time)  Labs Review Labs Reviewed - No data to display  Imaging Review No results found.   Visual Acuity Review  Right Eye Distance:   Left Eye Distance:   Bilateral Distance:    Right Eye Near:   Left Eye Near:    Bilateral Near:         MDM   1. Acute nonintractable headache, unspecified headache type   2. Visual changes   3. Obesity   4. History of diabetes  mellitus,  type II   Due to patient's sudden onset of headaches without a past history of headaches associated with visual changes and accommodation with risk factors for vascular disease it is recommended that she be evaluated in the emergency department with possible CT based on the examining providers findings an opinion. The patient states that she does not want to go to the ED and also to go home. She states that if she feels any worse she will call 911 or go to the emergency department. She is advised that during that time it is possible that her headache may get worse, she may have an intracranial lesion that could be serious such as a blockage, of bleeding or a tumor.    GET HELP IF: Your headaches get worse, you have problems with vision, speech, hearing, swallowing, additional weakness or numbness, vomiting, unusually sleepy, lethargic, loss of function of any part of your body, urinary incontinence or loss of control of your bowel or bladder you should call 911.  Your symptoms are not helped by medicine.  You have a headache that feels different than the other headaches.  You feel sick to your stomach (nauseous) or you throw up (vomit).  You have a fever. GET HELP RIGHT AWAY IF:   Your headache becomes really bad.  You keep throwing up.  You have a stiff neck.  You have trouble seeing.  You have trouble speaking.  You have pain in the eye or ear.  Your muscles are weak or you lose muscle control.  You lose your balance or have trouble walking.  You feel like you will pass out (faint) or you pass out.  You have confusion.       Janne Napoleon, NP 11/01/15 [phone]

## 2015-11-08 ENCOUNTER — Telehealth (HOSPITAL_COMMUNITY): Payer: Self-pay | Admitting: *Deleted

## 2015-11-08 ENCOUNTER — Ambulatory Visit (INDEPENDENT_AMBULATORY_CARE_PROVIDER_SITE_OTHER): Payer: Medicare Other

## 2015-11-08 ENCOUNTER — Encounter (HOSPITAL_COMMUNITY): Payer: Self-pay | Admitting: *Deleted

## 2015-11-08 ENCOUNTER — Ambulatory Visit (HOSPITAL_COMMUNITY)
Admission: EM | Admit: 2015-11-08 | Discharge: 2015-11-08 | Disposition: A | Discharge status: Home / Self Care | Payer: Medicare Other | Attending: Emergency Medicine | Admitting: Emergency Medicine

## 2015-11-08 DIAGNOSIS — M25562 Pain in left knee: Secondary | ICD-10-CM

## 2015-11-08 DIAGNOSIS — M25462 Effusion, left knee: Secondary | ICD-10-CM | POA: Diagnosis not present

## 2015-11-08 MED ORDER — KETOROLAC TROMETHAMINE 60 MG/2ML IM SOLN
60.0000 mg | Freq: Once | INTRAMUSCULAR | Status: AC
  Administered 2015-11-08: 60 mg via INTRAMUSCULAR

## 2015-11-08 MED ORDER — KETOROLAC TROMETHAMINE 60 MG/2ML IM SOLN
INTRAMUSCULAR | Status: AC
  Filled 2015-11-08: qty 2

## 2015-11-08 NOTE — ED Notes (Signed)
Pt states she slipped on water yesterday, landing on her buttocks while her LLE "twisted".  C/O severe pain in anterior and posterior left knee.  Pt normally ambulates with cane due to right knee problems.  States has been taking her normal morphine and oxycodone without relief of left leg pain.  "Normally they give me something and a shot of Toradol, but Toradol is not going to work".  Has been wearing knee sleeve, applying ice & heat.

## 2015-11-08 NOTE — ED Notes (Signed)
Updated on wait; comfort measures offered.

## 2015-11-08 NOTE — ED Provider Notes (Signed)
CSN: [id number]     Arrival date & time 11/08/15  1306 History   None    Chief Complaint  Patient presents with  . Fall   (Consider location/radiation/quality/duration/timing/severity/associated sxs/prior Treatment)  HPI   She is a 55 year old female presenting today with complaints of left knee pain where she "twisted the knee" yesterday when she slipped and fell on some water. The states she has tried ice, heat, and some old-fashioned peppermint oil.  Patient states she had a right knee replacement 2011 which "never took"; she is on chronic pain management for right knee pain for which she takes oxycodone and morphine daily. Denies significant medical history otherwise. States she is allergic to sulfa.  Past Medical History  Diagnosis Date  . History of knee replacement, total   . Fever   . Breast lump   . Breast discharge   . Breast pain   . Diabetes mellitus   . Thyroid disease   . Asthma   . Arthritis   . Depression   . Hypertension   . Bipolar 1 disorder ([place])   . Hypothyroidism   . Sleep apnea   . Graves disease   . Graves disease   . Pneumonia     2015,2014  . Anxiety   . GERD (gastroesophageal reflux disease)    Past Surgical History  Procedure Laterality Date  . Cesarean section    . Breast lumpectomy      right  . Hernia repair    . Appendectomy    . Abdominal hysterectomy      partial  . Knee surgery      right  . Myomectomy abdominal approach  1989  . Myomectomy abdominal approach  1989  . Cardiac catheterization      no significant CAD, nl LV function by 11/08/06 cath  . Total knee revision  06/21/2012    Procedure: TOTAL KNEE REVISION;  Surgeon: Newt Minion, MD;  Location: [facility];  Service: Orthopedics;  Laterality: Right;  Revision Right Total Knee Arthroplasty  . Gastric roux-en-y N/A 11/25/2014    Procedure: LAPAROSCOPIC ROUX-EN-Y GASTRIC BYPASS WITH UPPER ENDOSCOPY;  Surgeon: Johnathan Hausen, MD;  Location: [facility];  Service: General;  Laterality:  N/A;  . Joint replacement     Family History  Problem Relation Age of Onset  . Cancer Father     colon  . Stroke Father   . Heart disease Father   . Diabetes Father   . Hypertension Father   . Depression Sister   . Hypertension Mother    Social History  Substance Use Topics  . Smoking status: Former Smoker -- 0.50 packs/day for 4 years    Types: Cigarettes    Quit date: 09/13/2014  . Smokeless tobacco: Never Used  . Alcohol Use: No   OB History    No data available     Review of Systems  Constitutional: Negative.  Negative for fever and fatigue.  HENT: Negative.   Eyes: Negative.  Negative for visual disturbance.  Respiratory: Negative.  Negative for cough and shortness of breath.   Cardiovascular: Negative.  Negative for chest pain and leg swelling.  Gastrointestinal: Negative.   Endocrine: Negative.   Genitourinary: Negative.   Musculoskeletal: Positive for gait problem. Negative for neck stiffness.       R knee pain. The patient states she can only walk by "dragging her leg".   Skin: Negative.  Negative for wound.  Allergic/Immunologic: Negative.   Neurological: Negative.  Negative  for dizziness and headaches.  Hematological: Negative.   Psychiatric/Behavioral: Negative.     Allergies  Aspirin; Sulfa antibiotics; and Other  Home Medications   Prior to Admission medications   Medication Sig Start Date End Date Taking? Authorizing Provider  ADVAIR DISKUS 500-50 MCG/DOSE AEPB Inhale 1 puff into the lungs 2 (two) times daily as needed (shortness of breath, wheezing).  12/05/13  Yes Historical Provider, MD  albuterol (PROVENTIL HFA;VENTOLIN HFA) 108 (90 BASE) MCG/ACT inhaler Inhale 2 puffs into the lungs every 6 (six) hours as needed for wheezing or shortness of breath. Shortness of breath   Yes Historical Provider, MD  ALPRAZolam (XANAX) 1 MG tablet Take 1 mg by mouth 4 (four) times daily.  07/02/14  Yes Historical Provider, MD  citalopram (CELEXA) 20 MG tablet Take  20 mg by mouth daily. 01/21/15  Yes Historical Provider, MD  Cyanocobalamin (B-12 PO) Take 1 tablet by mouth every morning.    Yes Historical Provider, MD  fluticasone (FLONASE) 50 MCG/ACT nasal spray Place 2 sprays into the nose daily as needed (congestion).   Yes Historical Provider, MD  gabapentin (NEURONTIN) 600 MG tablet Take 600 mg by mouth 2 (two) times daily.   Yes Historical Provider, MD  levocetirizine (XYZAL) 5 MG tablet Take 5 mg by mouth daily as needed for allergies.  06/29/14  Yes Historical Provider, MD  levothyroxine (SYNTHROID, LEVOTHROID) 125 MCG tablet Take 125 mcg by mouth every morning.    Yes Historical Provider, MD  lidocaine (LIDODERM) 5 % Place 1 patch onto the skin daily. Remove & Discard patch within 12 hours or as directed by MD 09/07/15  Yes Melony Overly, MD  magnesium hydroxide (MILK OF MAGNESIA) 400 MG/5ML suspension Take 15 mLs by mouth daily as needed for mild constipation.   Yes Historical Provider, MD  methylphenidate (RITALIN) 20 MG tablet Take 20 mg by mouth 2 (two) times daily with breakfast and lunch.  06/14/13  Yes Historical Provider, MD  morphine (MS CONTIN) 15 MG 12 hr tablet Take 1 tablet (15 mg total) by mouth every 12 (twelve) hours. 11/14/14  Yes Bayard Hugger, NP  Multiple Vitamin (MULTIVITAMIN) tablet Take 1 tablet by mouth every morning.    Yes Historical Provider, MD  nabumetone (RELAFEN) 750 MG tablet Take 750 mg by mouth 2 (two) times daily as needed for mild pain.  10/04/13  Yes Historical Provider, MD  Oxycodone HCl 10 MG TABS Take 1 tablet (10 mg total) by mouth every 6 (six) hours as needed (for pain). 11/14/14  Yes Bayard Hugger, NP  REXULTI 2 MG TABS Take 2 mg by mouth at bedtime.  06/18/14  Yes Historical Provider, MD  VOLTAREN 1 % GEL Apply 1 application topically 4 (four) times daily as needed (pain).  01/23/15  Yes Historical Provider, MD  zolpidem (AMBIEN) 10 MG tablet Take 10 mg by mouth at bedtime.  09/07/14  Yes Historical Provider, MD   ciprofloxacin (CIPRO) 500 MG tablet Take 1 tablet (500 mg total) by mouth every 12 (twelve) hours. Patient not taking: Reported on 11/01/2015 07/10/15   Tanna Furry, MD  EPINEPHrine 0.3 mg/0.3 mL IJ SOAJ injection Inject 0.3 mg as directed as needed (severe allergic reaction).  01/11/14   Historical Provider, MD  phenazopyridine (PYRIDIUM) 200 MG tablet Take 1 tablet (200 mg total) by mouth 3 (three) times daily. 07/10/15   Tanna Furry, MD  potassium chloride 20 MEQ/15ML (10%) SOLN Take 7.5 mLs (10 mEq total) by mouth daily. 03/13/15  03/17/15  Robbie Lis, MD  promethazine (PHENERGAN) 25 MG tablet Take 1 tablet (25 mg total) by mouth every 6 (six) hours as needed for nausea or vomiting. 03/13/15   Robbie Lis, MD  QUEtiapine (SEROQUEL) 100 MG tablet Take 100 mg by mouth at bedtime.  04/09/14   Historical Provider, MD   Meds Ordered and Administered this Visit   Medications  ketorolac (TORADOL) injection 60 mg (60 mg Intramuscular Given 11/08/15 1503)    BP 119/76 mmHg  Pulse 65  Temp(Src) 98.7 F (37.1 C) (Oral)  Resp 18  SpO2 98% No data found.   Physical Exam  Constitutional: She is oriented to person, place, and time. She appears well-developed and well-nourished. No distress.  Neck: No thyromegaly present.  Cardiovascular: Normal rate, regular rhythm, normal heart sounds and intact distal pulses.  Exam reveals no gallop and no friction rub.   No murmur heard. Pulmonary/Chest: Effort normal and breath sounds normal. No respiratory distress. She has no wheezes. She has no rales. She exhibits no tenderness.  Musculoskeletal: She exhibits tenderness.       Left knee: She exhibits bony tenderness. She exhibits no ecchymosis, no deformity, no laceration, no erythema, normal alignment, no LCL laxity and normal patellar mobility. Tenderness found.  Patient will not allow me to passively extend knee due to discomfort.   Neurological: She is alert and oriented to person, place, and time.   Skin: Skin is warm and dry. No rash noted. She is not diaphoretic.  No breaks in skin or wounds noted.   Nursing note and vitals reviewed.   ED Course  Procedures (including critical care time)  Labs Review Labs Reviewed - No data to display  Imaging Review No results found.  Difficulty with getting results and xrays read.  The patient does not wish to wait any longer for xray results.  Plan of care discussed with Dr. Bridgett Larsson and patient given a knee immobilizer and told we will call her with results.   MDM   1. Knee pain, acute, left    The patient to follow up with PCP or orthopedist of her choice if symptoms worsen or fail to improve. The patient verbalizes understanding and agrees to plan of care.       Nehemiah Settle, NP 11/08/15 1723

## 2015-11-08 NOTE — Discharge Instructions (Signed)
Cryotherapy °Cryotherapy means treatment with cold. Ice or gel packs can be used to reduce both pain and swelling. Ice is the most helpful within the first 24 to 48 hours after an injury or flare-up from overusing a muscle or joint. Sprains, strains, spasms, burning pain, shooting pain, and aches can all be eased with ice. Ice can also be used when recovering from surgery. Ice is effective, has very few side effects, and is safe for most people to use. °PRECAUTIONS  °Ice is not a safe treatment option for people with: °· Raynaud phenomenon. This is a condition affecting small blood vessels in the extremities. Exposure to cold may cause your problems to return. °· Cold hypersensitivity. There are many forms of cold hypersensitivity, including: °· Cold urticaria. Red, itchy hives appear on the skin when the tissues begin to warm after being iced. °· Cold erythema. This is a red, itchy rash caused by exposure to cold. °· Cold hemoglobinuria. Red blood cells break down when the tissues begin to warm after being iced. The hemoglobin that carry oxygen are passed into the urine because they cannot combine with blood proteins fast enough. °· Numbness or altered sensitivity in the area being iced. °If you have any of the following conditions, do not use ice until you have discussed cryotherapy with your caregiver: °· Heart conditions, such as arrhythmia, angina, or chronic heart disease. °· High blood pressure. °· Healing wounds or open skin in the area being iced. °· Current infections. °· Rheumatoid arthritis. °· Poor circulation. °· Diabetes. °Ice slows the blood flow in the region it is applied. This is beneficial when trying to stop inflamed tissues from spreading irritating chemicals to surrounding tissues. However, if you expose your skin to cold temperatures for too long or without the proper protection, you can damage your skin or nerves. Watch for signs of skin damage due to cold. °HOME CARE INSTRUCTIONS °Follow  these tips to use ice and cold packs safely. °· Place a dry or damp towel between the ice and skin. A damp towel will cool the skin more quickly, so you may need to shorten the time that the ice is used. °· For a more rapid response, add gentle compression to the ice. °· Ice for no more than 10 to 20 minutes at a time. The bonier the area you are icing, the less time it will take to get the benefits of ice. °· Check your skin after 5 minutes to make sure there are no signs of a poor response to cold or skin damage. °· Rest 20 minutes or more between uses. °· Once your skin is numb, you can end your treatment. You can test numbness by very lightly touching your skin. The touch should be so light that you do not see the skin dimple from the pressure of your fingertip. When using ice, most people will feel these normal sensations in this order: cold, burning, aching, and numbness. °· Do not use ice on someone who cannot communicate their responses to pain, such as small children or people with dementia. °HOW TO MAKE AN ICE PACK °Ice packs are the most common way to use ice therapy. Other methods include ice massage, ice baths, and cryosprays. Muscle creams that cause a cold, tingly feeling do not offer the same benefits that ice offers and should not be used as a substitute unless recommended by your caregiver. °To make an ice pack, do one of the following: °· Place crushed ice or a   bag of frozen vegetables in a sealable plastic bag. Squeeze out the excess air. Place this bag inside another plastic bag. Slide the bag into a pillowcase or place a damp towel between your skin and the bag.  Mix 3 parts water with 1 part rubbing alcohol. Freeze the mixture in a sealable plastic bag. When you remove the mixture from the freezer, it will be slushy. Squeeze out the excess air. Place this bag inside another plastic bag. Slide the bag into a pillowcase or place a damp towel between your skin and the bag. SEEK MEDICAL CARE  IF:  You develop white spots on your skin. This may give the skin a blotchy (mottled) appearance.  Your skin turns blue or pale.  Your skin becomes waxy or hard.  Your swelling gets worse. MAKE SURE YOU:   Understand these instructions.  Will watch your condition.  Will get help right away if you are not doing well or get worse.   This information is not intended to replace advice given to you by your health care provider. Make sure you discuss any questions you have with your health care provider.   Document Released: 01/25/2011 Document Revised: 06/21/2014 Document Reviewed: 01/25/2011 Elsevier Interactive Patient Education 2016 [employer].  How to Use a Knee Brace A knee brace is a device that you wear to support your knee, especially if the knee is healing after an injury or surgery. There are several types of knee braces. Some are designed to prevent an injury (prophylactic brace). These are often worn during sports. Others support an injured knee (functional brace) or keep it still while it heals (rehabilitative brace). People with severe arthritis of the knee may benefit from a brace that takes some pressure off the knee (unloader brace). Most knee braces are made from a combination of cloth and metal or plastic.  You may need to wear a knee brace to:  Relieve knee pain.  Help your knee support your weight (improve stability).  Help you walk farther (improve mobility).  Prevent injury.  Support your knee while it heals from surgery or from an injury. RISKS AND COMPLICATIONS Generally, knee braces are very safe to wear. However, problems may occur, including:  Skin irritation that may lead to infection.  Making your condition worse if you wear the brace in the wrong way. HOW TO USE A KNEE BRACE Different braces will have different instructions for use. Your health care provider will tell you or show you:  How to put on your brace.  How to adjust the  brace.  When and how often to wear the brace.  How to remove the brace.  If you will need any assistive devices in addition to the brace, such as crutches or a cane. In general, your brace should:  Have the hinge of the brace line up with the bend of your knee.  Have straps, hooks, or tapes that fasten snugly around your leg.  Not feel too tight or too loose. HOW TO CARE FOR A KNEE BRACE  Check your brace often for signs of damage, such as loose connections or attachments. Your knee brace may get damaged or wear out during normal use.  Wash the fabric parts of your brace with soap and water.  Read the insert that comes with your brace for other specific care instructions. SEEK MEDICAL CARE IF:  Your knee brace is too loose or too tight and you cannot adjust it.  Your knee brace causes skin  redness, swelling, bruising, or irritation.  Your knee brace is not helping.  Your knee brace is making your knee pain worse.   This information is not intended to replace advice given to you by your health care provider. Make sure you discuss any questions you have with your health care provider.   Document Released: 08/21/2003 Document Revised: 02/19/2015 Document Reviewed: 09/23/2014 Elsevier Interactive Patient Education [employer].

## 2015-11-12 DIAGNOSIS — M7051 Other bursitis of knee, right knee: Secondary | ICD-10-CM | POA: Diagnosis not present

## 2015-11-12 DIAGNOSIS — Z96651 Presence of right artificial knee joint: Secondary | ICD-10-CM | POA: Diagnosis not present

## 2015-11-12 DIAGNOSIS — M1712 Unilateral primary osteoarthritis, left knee: Secondary | ICD-10-CM | POA: Diagnosis not present

## 2015-11-13 DIAGNOSIS — Z79899 Other long term (current) drug therapy: Secondary | ICD-10-CM | POA: Diagnosis not present

## 2015-11-13 DIAGNOSIS — M179 Osteoarthritis of knee, unspecified: Secondary | ICD-10-CM | POA: Diagnosis not present

## 2015-11-13 DIAGNOSIS — E119 Type 2 diabetes mellitus without complications: Secondary | ICD-10-CM | POA: Diagnosis not present

## 2015-11-13 DIAGNOSIS — R202 Paresthesia of skin: Secondary | ICD-10-CM | POA: Diagnosis not present

## 2015-11-13 DIAGNOSIS — Z9884 Bariatric surgery status: Secondary | ICD-10-CM | POA: Diagnosis not present

## 2015-11-13 DIAGNOSIS — R1013 Epigastric pain: Secondary | ICD-10-CM | POA: Diagnosis not present

## 2015-11-13 DIAGNOSIS — E039 Hypothyroidism, unspecified: Secondary | ICD-10-CM | POA: Diagnosis not present

## 2015-11-13 DIAGNOSIS — G4730 Sleep apnea, unspecified: Secondary | ICD-10-CM | POA: Diagnosis not present

## 2015-11-13 DIAGNOSIS — I10 Essential (primary) hypertension: Secondary | ICD-10-CM | POA: Diagnosis not present

## 2015-11-13 DIAGNOSIS — K219 Gastro-esophageal reflux disease without esophagitis: Secondary | ICD-10-CM | POA: Diagnosis not present

## 2015-11-13 DIAGNOSIS — E782 Mixed hyperlipidemia: Secondary | ICD-10-CM | POA: Diagnosis not present

## 2015-11-13 DIAGNOSIS — G8929 Other chronic pain: Secondary | ICD-10-CM | POA: Diagnosis not present

## 2015-11-26 DIAGNOSIS — Z111 Encounter for screening for respiratory tuberculosis: Secondary | ICD-10-CM | POA: Diagnosis not present

## 2015-12-10 ENCOUNTER — Ambulatory Visit: Payer: Self-pay | Admitting: Dietician

## 2015-12-17 DIAGNOSIS — M5442 Lumbago with sciatica, left side: Secondary | ICD-10-CM | POA: Diagnosis not present

## 2015-12-17 DIAGNOSIS — M25561 Pain in right knee: Secondary | ICD-10-CM | POA: Diagnosis not present

## 2015-12-17 DIAGNOSIS — Z6841 Body Mass Index (BMI) 40.0 and over, adult: Secondary | ICD-10-CM | POA: Diagnosis not present

## 2015-12-17 DIAGNOSIS — M5441 Lumbago with sciatica, right side: Secondary | ICD-10-CM | POA: Diagnosis not present

## 2015-12-17 DIAGNOSIS — I10 Essential (primary) hypertension: Secondary | ICD-10-CM | POA: Diagnosis not present

## 2015-12-21 ENCOUNTER — Ambulatory Visit (HOSPITAL_COMMUNITY): Payer: Medicare Other

## 2015-12-21 ENCOUNTER — Ambulatory Visit (HOSPITAL_COMMUNITY)
Admission: EM | Admit: 2015-12-21 | Discharge: 2015-12-21 | Disposition: A | Discharge status: Home / Self Care | Payer: Medicare Other | Attending: Family Medicine | Admitting: Family Medicine

## 2015-12-21 ENCOUNTER — Encounter (HOSPITAL_COMMUNITY): Payer: Self-pay | Admitting: *Deleted

## 2015-12-21 DIAGNOSIS — K219 Gastro-esophageal reflux disease without esophagitis: Secondary | ICD-10-CM | POA: Insufficient documentation

## 2015-12-21 DIAGNOSIS — W19XXXA Unspecified fall, initial encounter: Secondary | ICD-10-CM | POA: Insufficient documentation

## 2015-12-21 DIAGNOSIS — Z882 Allergy status to sulfonamides status: Secondary | ICD-10-CM | POA: Insufficient documentation

## 2015-12-21 DIAGNOSIS — Z87891 Personal history of nicotine dependence: Secondary | ICD-10-CM | POA: Insufficient documentation

## 2015-12-21 DIAGNOSIS — E039 Hypothyroidism, unspecified: Secondary | ICD-10-CM | POA: Insufficient documentation

## 2015-12-21 DIAGNOSIS — Y92009 Unspecified place in unspecified non-institutional (private) residence as the place of occurrence of the external cause: Secondary | ICD-10-CM

## 2015-12-21 DIAGNOSIS — Y92099 Unspecified place in other non-institutional residence as the place of occurrence of the external cause: Secondary | ICD-10-CM

## 2015-12-21 DIAGNOSIS — E119 Type 2 diabetes mellitus without complications: Secondary | ICD-10-CM | POA: Insufficient documentation

## 2015-12-21 DIAGNOSIS — S39012A Strain of muscle, fascia and tendon of lower back, initial encounter: Secondary | ICD-10-CM

## 2015-12-21 DIAGNOSIS — M1990 Unspecified osteoarthritis, unspecified site: Secondary | ICD-10-CM | POA: Diagnosis not present

## 2015-12-21 DIAGNOSIS — M25561 Pain in right knee: Secondary | ICD-10-CM | POA: Diagnosis not present

## 2015-12-21 DIAGNOSIS — Z888 Allergy status to other drugs, medicaments and biological substances status: Secondary | ICD-10-CM | POA: Diagnosis not present

## 2015-12-21 DIAGNOSIS — G4730 Sleep apnea, unspecified: Secondary | ICD-10-CM | POA: Diagnosis not present

## 2015-12-21 DIAGNOSIS — F319 Bipolar disorder, unspecified: Secondary | ICD-10-CM | POA: Diagnosis not present

## 2015-12-21 DIAGNOSIS — M25461 Effusion, right knee: Secondary | ICD-10-CM

## 2015-12-21 DIAGNOSIS — I10 Essential (primary) hypertension: Secondary | ICD-10-CM | POA: Diagnosis not present

## 2015-12-21 DIAGNOSIS — Z79899 Other long term (current) drug therapy: Secondary | ICD-10-CM | POA: Insufficient documentation

## 2015-12-21 DIAGNOSIS — F419 Anxiety disorder, unspecified: Secondary | ICD-10-CM | POA: Diagnosis not present

## 2015-12-21 DIAGNOSIS — J45909 Unspecified asthma, uncomplicated: Secondary | ICD-10-CM | POA: Diagnosis not present

## 2015-12-21 DIAGNOSIS — S3992XA Unspecified injury of lower back, initial encounter: Secondary | ICD-10-CM | POA: Diagnosis not present

## 2015-12-21 DIAGNOSIS — S8991XA Unspecified injury of right lower leg, initial encounter: Secondary | ICD-10-CM | POA: Diagnosis not present

## 2015-12-21 DIAGNOSIS — M545 Low back pain: Secondary | ICD-10-CM | POA: Diagnosis not present

## 2015-12-21 MED ORDER — KETOROLAC TROMETHAMINE 30 MG/ML IJ SOLN
30.0000 mg | Freq: Once | INTRAMUSCULAR | Status: AC
  Administered 2015-12-21: 30 mg via INTRAMUSCULAR

## 2015-12-21 MED ORDER — KETOROLAC TROMETHAMINE 30 MG/ML IJ SOLN
INTRAMUSCULAR | Status: AC
  Filled 2015-12-21: qty 1

## 2015-12-21 MED ORDER — CYCLOBENZAPRINE HCL 10 MG PO TABS: 10.0000 mg | ORAL_TABLET | Freq: Two times a day (BID) | ORAL | Status: DC | PRN

## 2015-12-21 NOTE — Discharge Instructions (Signed)
Back Injury Prevention Back injuries can be very painful. They can also be difficult to heal. After having one back injury, you are more likely to injure your back again. It is important to learn how to avoid injuring or re-injuring your back. The following tips can help you to prevent a back injury. WHAT SHOULD I KNOW ABOUT PHYSICAL FITNESS?  Exercise for 30 minutes per day on most days of the week or as told by your doctor. Make sure to:  Do aerobic exercises, such as walking, jogging, biking, or swimming.  Do exercises that increase balance and strength, such as tai chi and yoga.  Do stretching exercises. This helps with flexibility.  Try to develop strong belly (abdominal) muscles. Your belly muscles help to support your back.  Stay at a healthy weight. This helps to decrease your risk of a back injury. WHAT SHOULD I KNOW ABOUT MY DIET?  Talk with your doctor about your overall diet. Take supplements and vitamins only as told by your doctor.  Talk with your doctor about how much calcium and vitamin D you need each day. These nutrients help to prevent weakening of the bones (osteoporosis).  Include good sources of calcium in your diet, such as:  Dairy products.  Green leafy vegetables.  Products that have had calcium added to them (fortified).  Include good sources of vitamin D in your diet, such as:  Milk.  Foods that have had vitamin D added to them. WHAT SHOULD I KNOW ABOUT MY POSTURE?  Sit up straight and stand up straight. Avoid leaning forward when you sit or hunching over when you stand.  Choose chairs that have good low-back (lumbar) support.  If you work at a desk, sit close to it so you do not need to lean over. Keep your chin tucked in. Keep your neck drawn back. Keep your elbows bent so your arms look like the letter "L" (right angle).  Sit high and close to the steering wheel when you drive. Add a low-back support to your car seat, if needed.  Avoid sitting  or standing in one position for very long. Take breaks to get up, stretch, and walk around at least one time every hour. Take breaks every hour if you are driving for long periods of time.  Sleep on your side with your knees slightly bent, or sleep on your back with a pillow under your knees. Do not lie on the front of your body to sleep. WHAT SHOULD I KNOW ABOUT LIFTING, TWISTING, AND REACHING Lifting and Heavy Lifting  Avoid heavy lifting, especially lifting over and over again. If you must do heavy lifting:  Stretch before lifting.  Work slowly.  Rest between lifts.  Use a tool such as a cart or a dolly to move objects if one is available.  Make several small trips instead of carrying one heavy load.  Ask for help when you need it, especially when moving big objects.  Follow these steps when lifting:  Stand with your feet shoulder-width apart.  Get as close to the object as you can. Do not pick up a heavy object that is far from your body.  Use handles or lifting straps if they are available.  Bend at your knees. Squat down, but keep your heels off the floor.  Keep your shoulders back. Keep your chin tucked in. Keep your back straight.  Lift the object slowly while you tighten the muscles in your legs, belly, and butt. Keep the object  as close to the center of your body as possible.  Follow these steps when putting down a heavy load:  Stand with your feet shoulder-width apart.  Lower the object slowly while you tighten the muscles in your legs, belly, and butt. Keep the object as close to the center of your body as possible.  Keep your shoulders back. Keep your chin tucked in. Keep your back straight.  Bend at your knees. Squat down, but keep your heels off the floor.  Use handles or lifting straps if they are available. Twisting and Reaching  Avoid lifting heavy objects above your waist.  Do not twist at your waist while you are lifting or carrying a load. If  you need to turn, move your feet.  Do not bend over without bending at your knees.  Avoid reaching over your head, across a table, or for an object on a high surface.  WHAT ARE SOME OTHER TIPS?  Avoid wet floors and icy ground. Keep sidewalks clear of ice to prevent falls.   Do not sleep on a mattress that is too soft or too hard.   Keep items that you use often within easy reach.   Put heavier objects on shelves at waist level, and put lighter objects on lower or higher shelves.  Find ways to lower your stress, such as:  Exercise.  Massage.  Relaxation techniques.  Talk with your doctor if you feel anxious or depressed. These conditions can make back pain worse.  Wear flat heel shoes with cushioned soles.  Avoid making quick (sudden) movements.  Use both shoulder straps when carrying a backpack.  Do not use any tobacco products, including cigarettes, chewing tobacco, or electronic cigarettes. If you need help quitting, ask your doctor.   This information is not intended to replace advice given to you by your health care provider. Make sure you discuss any questions you have with your health care provider.   Document Released: 11/17/2007 Document Revised: 10/15/2014 Document Reviewed: 06/04/2014 Elsevier Interactive Patient Education 2016 [place] [facility], Adult As a hospital patient, your condition and the treatments you receive can increase your risk for falls. Some additional risk factors for falls in a hospital include:  Being in an unfamiliar environment.  Being on bed rest.  Your surgery.  Taking certain medicines.  Your tubing requirements, such as intravenous (IV) therapy or catheters. It is important that you learn how to decrease fall risks while at the hospital. Below are important tips that can help prevent falls. SAFETY TIPS FOR PREVENTING FALLS Talk about your risk of falling.  Ask your health care provider why you  are at risk for falling. Is it your medicine, illness, tubing placement, or something else?  Make a plan with your health care provider to keep you safe from falls.  Ask your health care provider or pharmacist about side effects of your medicines. Some medicines can make you dizzy or affect your coordination. Ask for help.  Ask for help before getting out of bed. You may need to press your call button.  Ask for assistance in getting safely to the toilet.  Ask for a walker or cane to be put at your bedside. Ask that most of the side rails on your bed be placed up before your health care provider leaves the room.  Ask family or friends to sit with you.  Ask for things that are out of your reach, such as your glasses, hearing aids, telephone,  bedside table, or call button. Follow these tips to avoid falling:  Stay lying or seated, rather than standing, while waiting for help.  Wear rubber-soled slippers or shoes whenever you walk in the hospital.  Avoid quick, sudden movements.  Change positions slowly.  Sit on the side of your bed before standing.  Stand up slowly and wait before you start to walk.  Let your health care provider know if there is a spill on the floor.  Pay careful attention to the medical equipment, electrical cords, and tubes around you.  When you need help, use your call button by your bed or in the bathroom. Wait for one of your health care providers to help you.  If you feel dizzy or unsure of your footing, return to bed and wait for assistance.  Avoid being distracted by the TV, telephone, or another person in your room.  Do not lean or support yourself on rolling objects, such as IV poles or bedside tables.   This information is not intended to replace advice given to you by your health care provider. Make sure you discuss any questions you have with your health care provider.   Document Released: 05/28/2000 Document Revised: 06/21/2014 Document  Reviewed: 02/06/2012 Elsevier Interactive Patient Education 2016 [place] therapy can help ease sore, stiff, injured, and tight muscles and joints. Heat relaxes your muscles, which may help ease your pain. Heat therapy should only be used on old, pre-existing, or long-lasting (chronic) injuries. Do not use heat therapy unless told by your doctor. HOW TO USE HEAT THERAPY There are several different kinds of heat therapy, including:  Moist heat pack.  Warm water bath.  Hot water bottle.  Electric heating pad.  Heated gel pack.  Heated wrap.  Electric heating pad. GENERAL HEAT THERAPY RECOMMENDATIONS   Do not sleep while using heat therapy. Only use heat therapy while you are awake.  Your skin may turn pink while using heat therapy. Do not use heat therapy if your skin turns red.  Do not use heat therapy if you have new pain.  High heat or long exposure to heat can cause burns. Be careful when using heat therapy to avoid burning your skin.  Do not use heat therapy on areas of your skin that are already irritated, such as with a rash or sunburn. GET HELP IF:   You have blisters, redness, swelling (puffiness), or numbness.  You have new pain.  Your pain is worse. MAKE SURE YOU:  Understand these instructions.  Will watch your condition.  Will get help right away if you are not doing well or get worse.   This information is not intended to replace advice given to you by your health care provider. Make sure you discuss any questions you have with your health care provider.   Document Released: 08/23/2011 Document Revised: 06/21/2014 Document Reviewed: 07/24/2013 Elsevier Interactive Patient Education 2016 [employer]  Knee Effusion Knee effusion means that you have extra fluid in your knee. This can cause pain. Your knee may be more difficult to bend and move. HOME CARE  Use crutches as told by your doctor.  Wear a knee brace as told by  your doctor.  Apply ice to the swollen area:  Put ice in a plastic bag.  Place a towel between your skin and the bag.  Leave the ice on for 20 minutes, 2-3 times per day.  Keep your knee raised (elevated) when you are sitting or  lying down.  Take medicines only as told by your doctor.  Do any rehabilitation or strengthening exercises as told by your doctor.  Rest your knee as told by your doctor. You may start doing your normal activities again when your doctor says it is okay.  Keep all follow-up visits as told by your doctor. This is important. GET HELP IF:   You continue to have pain in your knee. GET HELP RIGHT AWAY IF:  You have increased swelling or redness of your knee.  You have severe pain in your knee.  You have a fever.   This information is not intended to replace advice given to you by your health care provider. Make sure you discuss any questions you have with your health care provider.   Document Released: 07/03/2010 Document Revised: 06/21/2014 Document Reviewed: 01/14/2014 Elsevier Interactive Patient Education [employer].

## 2015-12-21 NOTE — ED Notes (Signed)
Patient was returned from xray

## 2015-12-21 NOTE — ED Provider Notes (Signed)
CSN: [id number]     Arrival date & time 12/21/15  1417 History   First MD Initiated Contact with Patient 12/21/15 1621     Chief Complaint  Patient presents with  . Fall  . Back Pain  . Knee Pain   Patient is a 55 y.o. female presenting with fall, back pain, and knee pain. The history is provided by the patient.  Fall This is a new problem. The current episode started more than 2 days ago. The problem occurs constantly. The problem has not changed since onset.Pertinent negatives include no chest pain, no abdominal pain, no headaches and no shortness of breath. The symptoms are aggravated by standing. The symptoms are relieved by rest and ice. She has tried a cold compress for the symptoms. The treatment provided mild relief.  Back Pain Associated symptoms: no abdominal pain, no chest pain and no headaches   Knee Pain Associated symptoms: back pain     Past Medical History  Diagnosis Date  . History of knee replacement, total   . Fever   . Breast lump   . Breast discharge   . Breast pain   . Diabetes mellitus   . Thyroid disease   . Asthma   . Arthritis   . Depression   . Hypertension   . Bipolar 1 disorder ([place])   . Hypothyroidism   . Sleep apnea   . Graves disease   . Graves disease   . Pneumonia     2015,2014  . Anxiety   . GERD (gastroesophageal reflux disease)    Past Surgical History  Procedure Laterality Date  . Cesarean section    . Breast lumpectomy      right  . Hernia repair    . Appendectomy    . Abdominal hysterectomy      partial  . Knee surgery      right  . Myomectomy abdominal approach  1989  . Myomectomy abdominal approach  1989  . Cardiac catheterization      no significant CAD, nl LV function by 11/08/06 cath  . Total knee revision  06/21/2012    Procedure: TOTAL KNEE REVISION;  Surgeon: Newt Minion, MD;  Location: [facility];  Service: Orthopedics;  Laterality: Right;  Revision Right Total Knee Arthroplasty  . Gastric roux-en-y N/A 11/25/2014   Procedure: LAPAROSCOPIC ROUX-EN-Y GASTRIC BYPASS WITH UPPER ENDOSCOPY;  Surgeon: Johnathan Hausen, MD;  Location: [facility];  Service: General;  Laterality: N/A;  . Joint replacement     Family History  Problem Relation Age of Onset  . Cancer Father     colon  . Stroke Father   . Heart disease Father   . Diabetes Father   . Hypertension Father   . Depression Sister   . Hypertension Mother    Social History  Substance Use Topics  . Smoking status: Former Smoker -- 0.50 packs/day for 4 years    Types: Cigarettes    Quit date: 09/13/2014  . Smokeless tobacco: Never Used  . Alcohol Use: No   OB History    No data available     Review of Systems  Respiratory: Negative for shortness of breath.   Cardiovascular: Negative for chest pain.  Gastrointestinal: Negative for abdominal pain.  Musculoskeletal: Positive for back pain.  Neurological: Negative for headaches.  All other systems reviewed and are negative.   Allergies  Aspirin; Sulfa antibiotics; and Other  Home Medications   Prior to Admission medications   Medication Sig Start  Date End Date Taking? Authorizing Provider  ADVAIR DISKUS 500-50 MCG/DOSE AEPB Inhale 1 puff into the lungs 2 (two) times daily as needed (shortness of breath, wheezing).  12/05/13   Historical Provider, MD  albuterol (PROVENTIL HFA;VENTOLIN HFA) 108 (90 BASE) MCG/ACT inhaler Inhale 2 puffs into the lungs every 6 (six) hours as needed for wheezing or shortness of breath. Shortness of breath    Historical Provider, MD  ALPRAZolam Duanne Moron) 1 MG tablet Take 1 mg by mouth 4 (four) times daily.  07/02/14   Historical Provider, MD  ciprofloxacin (CIPRO) 500 MG tablet Take 1 tablet (500 mg total) by mouth every 12 (twelve) hours. Patient not taking: Reported on 11/01/2015 07/10/15   Tanna Furry, MD  citalopram (CELEXA) 20 MG tablet Take 20 mg by mouth daily. 01/21/15   Historical Provider, MD  Cyanocobalamin (B-12 PO) Take 1 tablet by mouth every morning.      Historical Provider, MD  cyclobenzaprine (FLEXERIL) 10 MG tablet Take 1 tablet (10 mg total) by mouth 2 (two) times daily as needed for muscle spasms (back pain). 12/21/15   Rhetta Mura Kaylean Tupou, NP  EPINEPHrine 0.3 mg/0.3 mL IJ SOAJ injection Inject 0.3 mg as directed as needed (severe allergic reaction).  01/11/14   Historical Provider, MD  fluticasone (FLONASE) 50 MCG/ACT nasal spray Place 2 sprays into the nose daily as needed (congestion).    Historical Provider, MD  gabapentin (NEURONTIN) 600 MG tablet Take 600 mg by mouth 2 (two) times daily.    Historical Provider, MD  levocetirizine (XYZAL) 5 MG tablet Take 5 mg by mouth daily as needed for allergies.  06/29/14   Historical Provider, MD  levothyroxine (SYNTHROID, LEVOTHROID) 125 MCG tablet Take 125 mcg by mouth every morning.     Historical Provider, MD  lidocaine (LIDODERM) 5 % Place 1 patch onto the skin daily. Remove & Discard patch within 12 hours or as directed by MD 09/07/15   Melony Overly, MD  magnesium hydroxide (MILK OF MAGNESIA) 400 MG/5ML suspension Take 15 mLs by mouth daily as needed for mild constipation.    Historical Provider, MD  methylphenidate (RITALIN) 20 MG tablet Take 20 mg by mouth 2 (two) times daily with breakfast and lunch.  06/14/13   Historical Provider, MD  morphine (MS CONTIN) 15 MG 12 hr tablet Take 1 tablet (15 mg total) by mouth every 12 (twelve) hours. 11/14/14   Bayard Hugger, NP  Multiple Vitamin (MULTIVITAMIN) tablet Take 1 tablet by mouth every morning.     Historical Provider, MD  nabumetone (RELAFEN) 750 MG tablet Take 750 mg by mouth 2 (two) times daily as needed for mild pain.  10/04/13   Historical Provider, MD  Oxycodone HCl 10 MG TABS Take 1 tablet (10 mg total) by mouth every 6 (six) hours as needed (for pain). 11/14/14   Bayard Hugger, NP  phenazopyridine (PYRIDIUM) 200 MG tablet Take 1 tablet (200 mg total) by mouth 3 (three) times daily. 07/10/15   Tanna Furry, MD  potassium chloride 20 MEQ/15ML (10%) SOLN  Take 7.5 mLs (10 mEq total) by mouth daily. 03/13/15 03/17/15  Robbie Lis, MD  promethazine (PHENERGAN) 25 MG tablet Take 1 tablet (25 mg total) by mouth every 6 (six) hours as needed for nausea or vomiting. 03/13/15   Robbie Lis, MD  QUEtiapine (SEROQUEL) 100 MG tablet Take 100 mg by mouth at bedtime.  04/09/14   Historical Provider, MD  REXULTI 2 MG TABS Take 2 mg by  mouth at bedtime.  06/18/14   Historical Provider, MD  VOLTAREN 1 % GEL Apply 1 application topically 4 (four) times daily as needed (pain).  01/23/15   Historical Provider, MD  zolpidem (AMBIEN) 10 MG tablet Take 10 mg by mouth at bedtime.  09/07/14   Historical Provider, MD   Meds Ordered and Administered this Visit   Medications  ketorolac (TORADOL) 30 MG/ML injection 30 mg (30 mg Intramuscular Given 12/21/15 1552)    BP 130/75 mmHg  Pulse 62  Temp(Src) 98 F (36.7 C) (Oral)  Resp 12  SpO2 97% No data found.   Physical Exam  Constitutional: She appears well-developed and well-nourished.  HENT:  Head: Normocephalic and atraumatic.  Eyes: Conjunctivae are normal.  Neck: Normal range of motion. Neck supple. Spinous process tenderness and muscular tenderness present.  LS-spine bony TTP. No neck or t-spine TTP.  Cardiovascular: Normal rate.   Pulmonary/Chest: Effort normal.  Musculoskeletal:       Right knee: She exhibits effusion.       Lumbar back: She exhibits bony tenderness.    ED Course  Procedures (including critical care time)  Labs Review Labs Reviewed - No data to display  Imaging Review Dg Lumbar Spine 2-3 Views  12/21/2015  CLINICAL DATA:  55 year old female who fell 2 days ago at home. Continued lumbar back pain. Initial encounter. EXAM: LUMBAR SPINE - 2-3 VIEW COMPARISON:  Lumbar MRI 06/11/2015.  Lumbar radiographs 05/30/2015. FINDINGS: Normal lumbar segmentation again noted. Bone mineralization remains normal. Lumbar vertebral height and alignment appears stable. Visible lower thoracic levels appear  stable. Sacral ala and SI joints appear stable. Stable disc spaces. IMPRESSION: No acute fracture or listhesis identified in the lumbar spine. Electronically Signed   By: Genevie Ann M.D.   On: 12/21/2015 16:24   Dg Knee Complete 4 Views Right  12/21/2015  CLINICAL DATA:  Patient reports falling Friday night after slipping on some water at the house, states fell backwards hitting head on ground, no LOC, and injuring lower back and right knee. Patient has tried rest at home but has had continued discomfort. EXAM: RIGHT KNEE - COMPLETE 4+ VIEW COMPARISON:  02/12/15 FINDINGS: RIGHT total knee arthroplasty. No evidence of fracture or dislocation associated with the prosthetic. Small joint effusion is present superior to the patella. There is a lucency outlining the cement seating the tibial prosthetic. This finding is similar to comparison exam IMPRESSION: 1. No evidence of fracture or dislocation. Electronically Signed   By: Suzy Bouchard M.D.   On: 12/21/2015 16:27     Visual Acuity Review  Right Eye Distance:   Left Eye Distance:   Bilateral Distance:    Right Eye Near:   Left Eye Near:    Bilateral Near:         MDM   1. Fall at home, initial encounter   2. Lumbar strain, initial encounter   3. Knee effusion, right   Rx: Flexeril Continue home pain med regimen as previously directed. Follow up as needed w/ PCP.    Jeryl Columbia, NP 12/21/15 [id number]

## 2015-12-21 NOTE — ED Notes (Signed)
Patient reports falling Friday night after slipping on some water at the house, states fell backwards hitting head on ground, no LOC, and injuring lower back and right knee. Patient has tried rest at home but has had continued discomfort. Patient purchased knee sleeve but reports no relief.

## 2016-01-02 DIAGNOSIS — L309 Dermatitis, unspecified: Secondary | ICD-10-CM | POA: Diagnosis not present

## 2016-01-02 DIAGNOSIS — M25561 Pain in right knee: Secondary | ICD-10-CM | POA: Diagnosis not present

## 2016-01-02 DIAGNOSIS — R296 Repeated falls: Secondary | ICD-10-CM | POA: Diagnosis not present

## 2016-01-06 ENCOUNTER — Encounter: Payer: Self-pay | Admitting: Interventional Cardiology

## 2016-01-15 DIAGNOSIS — Z471 Aftercare following joint replacement surgery: Secondary | ICD-10-CM | POA: Diagnosis not present

## 2016-01-15 DIAGNOSIS — M1712 Unilateral primary osteoarthritis, left knee: Secondary | ICD-10-CM | POA: Diagnosis not present

## 2016-01-15 DIAGNOSIS — Z96651 Presence of right artificial knee joint: Secondary | ICD-10-CM | POA: Diagnosis not present

## 2016-01-19 DIAGNOSIS — F250 Schizoaffective disorder, bipolar type: Secondary | ICD-10-CM | POA: Diagnosis not present

## 2016-01-21 ENCOUNTER — Other Ambulatory Visit: Payer: Self-pay | Admitting: Orthopedic Surgery

## 2016-01-21 ENCOUNTER — Other Ambulatory Visit (HOSPITAL_COMMUNITY): Payer: Self-pay | Admitting: Orthopedic Surgery

## 2016-01-21 DIAGNOSIS — Z96651 Presence of right artificial knee joint: Secondary | ICD-10-CM

## 2016-01-26 ENCOUNTER — Encounter (INDEPENDENT_AMBULATORY_CARE_PROVIDER_SITE_OTHER): Payer: Self-pay

## 2016-01-26 ENCOUNTER — Other Ambulatory Visit: Payer: Self-pay | Admitting: Interventional Cardiology

## 2016-01-26 ENCOUNTER — Ambulatory Visit (INDEPENDENT_AMBULATORY_CARE_PROVIDER_SITE_OTHER): Payer: Medicare Other | Admitting: Interventional Cardiology

## 2016-01-26 ENCOUNTER — Encounter: Payer: Self-pay | Admitting: Interventional Cardiology

## 2016-01-26 VITALS — BP 120/76 | HR 71 | Ht 65.0 in | Wt 242.1 lb

## 2016-01-26 DIAGNOSIS — E785 Hyperlipidemia, unspecified: Secondary | ICD-10-CM | POA: Diagnosis not present

## 2016-01-26 DIAGNOSIS — G4733 Obstructive sleep apnea (adult) (pediatric): Secondary | ICD-10-CM

## 2016-01-26 DIAGNOSIS — E118 Type 2 diabetes mellitus with unspecified complications: Secondary | ICD-10-CM

## 2016-01-26 DIAGNOSIS — I10 Essential (primary) hypertension: Secondary | ICD-10-CM | POA: Diagnosis not present

## 2016-01-26 DIAGNOSIS — R079 Chest pain, unspecified: Secondary | ICD-10-CM | POA: Diagnosis not present

## 2016-01-26 MED ORDER — NITROGLYCERIN 0.4 MG SL SUBL: 0.4000 mg | SUBLINGUAL_TABLET | SUBLINGUAL | 3 refills | Status: DC | PRN

## 2016-01-26 MED ORDER — HYDROCHLOROTHIAZIDE 12.5 MG PO CAPS: 12.5000 mg | ORAL_CAPSULE | Freq: Every day | ORAL | 11 refills | Status: DC

## 2016-01-26 NOTE — Patient Instructions (Addendum)
Medication Instructions:  Your physician has recommended you make the following change in your medication:  1)START HCTZ 12.5mg  daily 2)START Nitro-glycerin as needed as directed for chest pain lasting >5 minutes   Labwork: None ordered  Testing/Procedures: Your physician has requested that you have a lexiscan myoview. For further information please visit [email or web address]. Please follow instruction sheet, as given.   Follow-Up: You have a follow up appointment on 03/10/16 @ 10:45am  Any Other Special Instructions Will Be Listed Below (If Applicable).     If you need a refill on your cardiac medications before your next appointment, please call your pharmacy.

## 2016-01-26 NOTE — Progress Notes (Signed)
Cardiology Office Note    Date:  01/26/2016   ID:  Caroline Williams, DOB 1961-04-20, MRN [id number]  PCP:  Antony Blackbird, MD  Cardiologist: Sinclair Grooms, MD   Chief Complaint  Patient presents with  . Follow-up    New  . Chest Pain    History of Present Illness:  Caroline Williams is a 55 y.o. female who presents for consultation concerning chest discomfort. Her background medical history includes multiple risk factors for vascular disease including morbid obesity (recent loss of 100 pounds), tobacco use, hypertension, hyperlipidemia, obstructive sleep apnea, and family history (father) of vascular disease. Her biggest problem is chronic pain syndrome related to osteoarthritis in her right knee and degenerative disc disease.  Caroline Williams is 55 years of age and has above clinical profile. She is referred by Dr. Chapman Fitch for cardiac evaluation due to multiple risk factors for vascular disease. This also been some recent difficulty with blood pressure control. Valsartan and an additional medication has been started for blood pressure control. Simvastatin has been started for lipid management.  The patient expresses episodes over the past 4 months of a pressure-like sensation in the midsternal area associated with radiation of discomfort to the neck and into the left shoulder. Episodes last up to 10-15 minutes and are associated with diaphoresis and dyspnea. Episodes occur spontaneously. They are not reproducible. She has never sought medical attention for that episodes. She characterizes the episodes as "scary".  Past Medical History:  Diagnosis Date  . Anxiety   . Arthritis   . Asthma   . Bipolar 1 disorder ([place])   . Breast discharge   . Breast lump   . Breast pain   . Depression   . Diabetes mellitus   . Fever   . GERD (gastroesophageal reflux disease)   . Graves disease   . Graves disease   . History of knee replacement, total   . Hypertension   . Hypothyroidism   . Pneumonia     2015,2014  . Sleep apnea   . Thyroid disease     Past Surgical History:  Procedure Laterality Date  . ABDOMINAL HYSTERECTOMY     partial  . APPENDECTOMY    . BREAST LUMPECTOMY     right  . CARDIAC CATHETERIZATION     no significant CAD, nl LV function by 11/08/06 cath  . CESAREAN SECTION    . GASTRIC ROUX-EN-Y N/A 11/25/2014   Procedure: LAPAROSCOPIC ROUX-EN-Y GASTRIC BYPASS WITH UPPER ENDOSCOPY;  Surgeon: Johnathan Hausen, MD;  Location: [facility];  Service: General;  Laterality: N/A;  . HERNIA REPAIR    . JOINT REPLACEMENT    . KNEE SURGERY     right  . [place]  . [place]  . TOTAL KNEE REVISION  06/21/2012   Procedure: TOTAL KNEE REVISION;  Surgeon: Newt Minion, MD;  Location: [facility];  Service: Orthopedics;  Laterality: Right;  Revision Right Total Knee Arthroplasty    Current Medications: Outpatient Medications Prior to Visit  Medication Sig Dispense Refill  . ADVAIR DISKUS 500-50 MCG/DOSE AEPB Inhale 1 puff into the lungs 2 (two) times daily as needed (shortness of breath, wheezing).     Marland Kitchen albuterol (PROVENTIL HFA;VENTOLIN HFA) 108 (90 BASE) MCG/ACT inhaler Inhale 2 puffs into the lungs every 6 (six) hours as needed for wheezing or shortness of breath. Shortness of breath    . ALPRAZolam (XANAX) 1 MG tablet Take 1 mg by  mouth 4 (four) times daily.   0  . citalopram (CELEXA) 20 MG tablet Take 20 mg by mouth daily.  1  . Cyanocobalamin (B-12 PO) Take 1 tablet by mouth every morning.     . cyclobenzaprine (FLEXERIL) 10 MG tablet Take 1 tablet (10 mg total) by mouth 2 (two) times daily as needed for muscle spasms (back pain). 20 tablet 0  . EPINEPHrine 0.3 mg/0.3 mL IJ SOAJ injection Inject 0.3 mg as directed as needed (severe allergic reaction).     . fluticasone (FLONASE) 50 MCG/ACT nasal spray Place 2 sprays into the nose daily as needed (congestion).    . gabapentin (NEURONTIN) 600 MG tablet Take 600 mg by mouth 2 (two)  times daily.    Marland Kitchen levocetirizine (XYZAL) 5 MG tablet Take 5 mg by mouth daily as needed for allergies.   2  . levothyroxine (SYNTHROID, LEVOTHROID) 125 MCG tablet Take 125 mcg by mouth every morning.     . lidocaine (LIDODERM) 5 % Place 1 patch onto the skin daily. Remove & Discard patch within 12 hours or as directed by MD 30 patch 0  . magnesium hydroxide (MILK OF MAGNESIA) 400 MG/5ML suspension Take 15 mLs by mouth daily as needed for mild constipation.    . methylphenidate (RITALIN) 20 MG tablet Take 20 mg by mouth 2 (two) times daily with breakfast and lunch.     . morphine (MS CONTIN) 15 MG 12 hr tablet Take 1 tablet (15 mg total) by mouth every 12 (twelve) hours. 60 tablet 0  . Multiple Vitamin (MULTIVITAMIN) tablet Take 1 tablet by mouth every morning.     . nabumetone (RELAFEN) 750 MG tablet Take 750 mg by mouth 2 (two) times daily as needed for mild pain.     . Oxycodone HCl 10 MG TABS Take 1 tablet (10 mg total) by mouth every 6 (six) hours as needed (for pain). 120 tablet 0  . promethazine (PHENERGAN) 25 MG tablet Take 1 tablet (25 mg total) by mouth every 6 (six) hours as needed for nausea or vomiting. 30 tablet 0  . REXULTI 2 MG TABS Take 2 mg by mouth at bedtime.   2  . VOLTAREN 1 % GEL Apply 1 application topically 4 (four) times daily as needed (pain).   11  . zolpidem (AMBIEN) 10 MG tablet Take 10 mg by mouth at bedtime.   5  . ciprofloxacin (CIPRO) 500 MG tablet Take 1 tablet (500 mg total) by mouth every 12 (twelve) hours. (Patient not taking: Reported on 11/01/2015) 14 tablet 0  . phenazopyridine (PYRIDIUM) 200 MG tablet Take 1 tablet (200 mg total) by mouth 3 (three) times daily. (Patient not taking: Reported on 01/26/2016) 6 tablet 0  . potassium chloride 20 MEQ/15ML (10%) SOLN Take 7.5 mLs (10 mEq total) by mouth daily. 40 mL 0  . QUEtiapine (SEROQUEL) 100 MG tablet Take 100 mg by mouth at bedtime.   2   No facility-administered medications prior to visit.      Allergies:    Aspirin; Sulfa antibiotics; and Other   Social History   Social History  . Marital status: Single    Spouse name: N/A  . Number of children: N/A  . Years of education: N/A   Social History Main Topics  . Smoking status: Current Every Day Smoker    Packs/day: 0.50    Years: 4.00    Types: Cigarettes    Last attempt to quit: 09/13/2014  . Smokeless tobacco: Never  Used  . Alcohol use No  . Drug use: No  . Sexual activity: Not Asked   Other Topics Concern  . None   Social History Narrative  . None     Family History:  The patient's family history includes Cancer in her father; Depression in her sister; Diabetes in her father; Heart disease in her father; Hypertension in her father and mother; Stroke in her father.   ROS:   Please see the history of present illness.    Occasional chest pains. Bilateral lower extremity swelling. Depression, back pain, muscle pain, dizziness, joint swelling, difficulty with balance and walking, headaches, irregular heartbeat, leg pain, excessive fatigue.  All other systems reviewed and are negative.   PHYSICAL EXAM:   VS:  BP 120/76   Pulse 71   Ht 5\' 5"  (1.651 m)   Wt 242 lb 1.9 oz (109.8 kg)   BMI 40.29 kg/m   marked obesity. GEN: Well nourished, well developed, in no acute distress  HEENT: normal  Neck: no JVD, carotid bruits, or masses Cardiac: RRR; no murmurs, rubs, or gallops,no edema  Respiratory:  clear to auscultation bilaterally, normal work of breathing GI: soft, nontender, nondistended, + BS MS: no deformity or atrophy  Skin: warm and dry, no rash Neuro:  Alert and Oriented x 3, Strength and sensation are intact Psych: euthymic mood, full affect  Wt Readings from Last 3 Encounters:  01/26/16 242 lb 1.9 oz (109.8 kg)  09/09/15 241 lb 8 oz (109.5 kg)  03/31/15 269 lb (122 kg)      Studies/Labs Reviewed:   EKG:  EKG  Normal sinus rhythm, left atrial abnormality, poor R-wave progression, leftward axis, relatively low  voltage.  Recent Labs: 03/10/2015: ALT 28 03/12/2015: Hemoglobin 11.3; Magnesium 1.9; Platelets 143 03/13/2015: BUN <5; Creatinine, Ser 0.72; Potassium 3.2; Sodium 140   Lipid Panel No results found for: CHOL, TRIG, HDL, CHOLHDL, VLDL, LDLCALC, LDLDIRECT  Additional studies/ records that were reviewed today include:  Recent laboratory data at [facility] reveal a normal hemoglobin of 13.4 hemoglobin A1c 5.7 LDL cholesterol 127 4 which simvastatin was started.    ASSESSMENT:    1. Chest pain, unspecified chest pain type   2. Essential hypertension   3. Morbid obesity due to excess calories ([place])   4. Hyperlipidemia   5. Controlled type 2 diabetes mellitus with complication, without long-term current use of insulin ([place])   6. Obstructive sleep apnea      PLAN:  In order of problems listed above:  1. Lexiscan myocardial perfusion imaging to rule out high risk subset/myocardial ischemia back head account for the patient's somewhat atypical but recurrent episodes of chest pressure. 2. Current regimen has not been fully delineated. She is on valsartan 160 mg daily. Low salt diet and add hydrochlorothiazide 12.5 mg per day. 3. Decrease calories and diet have 5 someway to get in aerobic activity to help with continued weight loss following LAP-BAND surgery. 4. Continue statin therapy with LDL target of 70 mg/dL. 5. Diet and weight loss interspersed with aerobic activity 6. Discussed the importance of management of sleep apnea to decrease cardiovascular risk.    Medication Adjustments/Labs and Tests Ordered: Current medicines are reviewed at length with the patient today.  Concerns regarding medicines are outlined above.  Medication changes, Labs and Tests ordered today are listed in the Patient Instructions below. Patient Instructions  Medication Instructions:  Your physician has recommended you make the following change in your medication:  1)START HCTZ 12.5mg  daily  2)START Nitro-glycerin  as needed as directed for chest pain lasting >5 minutes   Labwork: None ordered  Testing/Procedures: Your physician has requested that you have a lexiscan myoview. For further information please visit [email or web address]. Please follow instruction sheet, as given.   Follow-Up: You have a follow up appointment on 03/10/16 @ 10:45am  Any Other Special Instructions Will Be Listed Below (If Applicable).     If you need a refill on your cardiac medications before your next appointment, please call your pharmacy.      Signed, Sinclair Grooms, MD  01/26/2016 10:09 AM    [facility] [address], [place], [place]  [address] Phone: [phone]; Fax: [phone]

## 2016-01-28 DIAGNOSIS — M25461 Effusion, right knee: Secondary | ICD-10-CM | POA: Diagnosis not present

## 2016-01-28 DIAGNOSIS — M25561 Pain in right knee: Secondary | ICD-10-CM | POA: Diagnosis not present

## 2016-01-28 DIAGNOSIS — R262 Difficulty in walking, not elsewhere classified: Secondary | ICD-10-CM | POA: Diagnosis not present

## 2016-01-28 DIAGNOSIS — M62451 Contracture of muscle, right thigh: Secondary | ICD-10-CM | POA: Diagnosis not present

## 2016-01-30 DIAGNOSIS — M25561 Pain in right knee: Secondary | ICD-10-CM | POA: Diagnosis not present

## 2016-01-30 DIAGNOSIS — R262 Difficulty in walking, not elsewhere classified: Secondary | ICD-10-CM | POA: Diagnosis not present

## 2016-01-30 DIAGNOSIS — M25461 Effusion, right knee: Secondary | ICD-10-CM | POA: Diagnosis not present

## 2016-01-30 DIAGNOSIS — M62451 Contracture of muscle, right thigh: Secondary | ICD-10-CM | POA: Diagnosis not present

## 2016-02-02 DIAGNOSIS — F250 Schizoaffective disorder, bipolar type: Secondary | ICD-10-CM | POA: Diagnosis not present

## 2016-02-04 DIAGNOSIS — M25561 Pain in right knee: Secondary | ICD-10-CM | POA: Diagnosis not present

## 2016-02-04 DIAGNOSIS — R262 Difficulty in walking, not elsewhere classified: Secondary | ICD-10-CM | POA: Diagnosis not present

## 2016-02-04 DIAGNOSIS — M62451 Contracture of muscle, right thigh: Secondary | ICD-10-CM | POA: Diagnosis not present

## 2016-02-04 DIAGNOSIS — M25461 Effusion, right knee: Secondary | ICD-10-CM | POA: Diagnosis not present

## 2016-02-06 DIAGNOSIS — M1712 Unilateral primary osteoarthritis, left knee: Secondary | ICD-10-CM | POA: Diagnosis not present

## 2016-02-06 DIAGNOSIS — Z471 Aftercare following joint replacement surgery: Secondary | ICD-10-CM | POA: Diagnosis not present

## 2016-02-06 DIAGNOSIS — M7051 Other bursitis of knee, right knee: Secondary | ICD-10-CM | POA: Diagnosis not present

## 2016-02-06 DIAGNOSIS — Z96651 Presence of right artificial knee joint: Secondary | ICD-10-CM | POA: Diagnosis not present

## 2016-02-11 DIAGNOSIS — R262 Difficulty in walking, not elsewhere classified: Secondary | ICD-10-CM | POA: Diagnosis not present

## 2016-02-11 DIAGNOSIS — M25561 Pain in right knee: Secondary | ICD-10-CM | POA: Diagnosis not present

## 2016-02-11 DIAGNOSIS — M25461 Effusion, right knee: Secondary | ICD-10-CM | POA: Diagnosis not present

## 2016-02-11 DIAGNOSIS — M62451 Contracture of muscle, right thigh: Secondary | ICD-10-CM | POA: Diagnosis not present

## 2016-02-12 DIAGNOSIS — R262 Difficulty in walking, not elsewhere classified: Secondary | ICD-10-CM | POA: Diagnosis not present

## 2016-02-12 DIAGNOSIS — M25561 Pain in right knee: Secondary | ICD-10-CM | POA: Diagnosis not present

## 2016-02-12 DIAGNOSIS — M25461 Effusion, right knee: Secondary | ICD-10-CM | POA: Diagnosis not present

## 2016-02-12 DIAGNOSIS — M62451 Contracture of muscle, right thigh: Secondary | ICD-10-CM | POA: Diagnosis not present

## 2016-02-17 ENCOUNTER — Encounter (HOSPITAL_COMMUNITY): Admission: RE | Admit: 2016-02-17 | Payer: Medicare Other | Source: Ambulatory Visit

## 2016-02-17 ENCOUNTER — Encounter (HOSPITAL_COMMUNITY)
Admission: RE | Admit: 2016-02-17 | Discharge: 2016-02-17 | Disposition: A | Discharge status: Home / Self Care | Payer: Medicare Other | Source: Ambulatory Visit | Attending: Orthopedic Surgery | Admitting: Orthopedic Surgery

## 2016-02-17 DIAGNOSIS — M25561 Pain in right knee: Secondary | ICD-10-CM | POA: Diagnosis not present

## 2016-02-17 DIAGNOSIS — Z96651 Presence of right artificial knee joint: Secondary | ICD-10-CM | POA: Diagnosis not present

## 2016-02-17 DIAGNOSIS — M25562 Pain in left knee: Secondary | ICD-10-CM | POA: Diagnosis not present

## 2016-02-18 DIAGNOSIS — M25561 Pain in right knee: Secondary | ICD-10-CM | POA: Diagnosis not present

## 2016-02-18 DIAGNOSIS — M62451 Contracture of muscle, right thigh: Secondary | ICD-10-CM | POA: Diagnosis not present

## 2016-02-18 DIAGNOSIS — M25461 Effusion, right knee: Secondary | ICD-10-CM | POA: Diagnosis not present

## 2016-02-18 DIAGNOSIS — R262 Difficulty in walking, not elsewhere classified: Secondary | ICD-10-CM | POA: Diagnosis not present

## 2016-02-20 DIAGNOSIS — M25561 Pain in right knee: Secondary | ICD-10-CM | POA: Diagnosis not present

## 2016-02-20 DIAGNOSIS — R262 Difficulty in walking, not elsewhere classified: Secondary | ICD-10-CM | POA: Diagnosis not present

## 2016-02-20 DIAGNOSIS — M62451 Contracture of muscle, right thigh: Secondary | ICD-10-CM | POA: Diagnosis not present

## 2016-02-20 DIAGNOSIS — M25461 Effusion, right knee: Secondary | ICD-10-CM | POA: Diagnosis not present

## 2016-02-23 DIAGNOSIS — F250 Schizoaffective disorder, bipolar type: Secondary | ICD-10-CM | POA: Diagnosis not present

## 2016-02-25 DIAGNOSIS — M62451 Contracture of muscle, right thigh: Secondary | ICD-10-CM | POA: Diagnosis not present

## 2016-02-25 DIAGNOSIS — M25461 Effusion, right knee: Secondary | ICD-10-CM | POA: Diagnosis not present

## 2016-02-25 DIAGNOSIS — R262 Difficulty in walking, not elsewhere classified: Secondary | ICD-10-CM | POA: Diagnosis not present

## 2016-02-25 DIAGNOSIS — M25561 Pain in right knee: Secondary | ICD-10-CM | POA: Diagnosis not present

## 2016-02-27 DIAGNOSIS — R262 Difficulty in walking, not elsewhere classified: Secondary | ICD-10-CM | POA: Diagnosis not present

## 2016-02-27 DIAGNOSIS — M25461 Effusion, right knee: Secondary | ICD-10-CM | POA: Diagnosis not present

## 2016-02-27 DIAGNOSIS — M25561 Pain in right knee: Secondary | ICD-10-CM | POA: Diagnosis not present

## 2016-02-27 DIAGNOSIS — M62451 Contracture of muscle, right thigh: Secondary | ICD-10-CM | POA: Diagnosis not present

## 2016-03-01 ENCOUNTER — Ambulatory Visit (HOSPITAL_COMMUNITY): Payer: Self-pay

## 2016-03-02 ENCOUNTER — Ambulatory Visit (HOSPITAL_COMMUNITY): Payer: Self-pay

## 2016-03-03 DIAGNOSIS — M25461 Effusion, right knee: Secondary | ICD-10-CM | POA: Diagnosis not present

## 2016-03-03 DIAGNOSIS — R262 Difficulty in walking, not elsewhere classified: Secondary | ICD-10-CM | POA: Diagnosis not present

## 2016-03-03 DIAGNOSIS — M62451 Contracture of muscle, right thigh: Secondary | ICD-10-CM | POA: Diagnosis not present

## 2016-03-03 DIAGNOSIS — M25561 Pain in right knee: Secondary | ICD-10-CM | POA: Diagnosis not present

## 2016-03-05 DIAGNOSIS — M25561 Pain in right knee: Secondary | ICD-10-CM | POA: Diagnosis not present

## 2016-03-05 DIAGNOSIS — M62451 Contracture of muscle, right thigh: Secondary | ICD-10-CM | POA: Diagnosis not present

## 2016-03-05 DIAGNOSIS — R262 Difficulty in walking, not elsewhere classified: Secondary | ICD-10-CM | POA: Diagnosis not present

## 2016-03-05 DIAGNOSIS — M25461 Effusion, right knee: Secondary | ICD-10-CM | POA: Diagnosis not present

## 2016-03-10 ENCOUNTER — Ambulatory Visit: Payer: Self-pay | Admitting: Interventional Cardiology

## 2016-03-10 DIAGNOSIS — M25461 Effusion, right knee: Secondary | ICD-10-CM | POA: Diagnosis not present

## 2016-03-10 DIAGNOSIS — R262 Difficulty in walking, not elsewhere classified: Secondary | ICD-10-CM | POA: Diagnosis not present

## 2016-03-10 DIAGNOSIS — M62451 Contracture of muscle, right thigh: Secondary | ICD-10-CM | POA: Diagnosis not present

## 2016-03-10 DIAGNOSIS — M25561 Pain in right knee: Secondary | ICD-10-CM | POA: Diagnosis not present

## 2016-03-15 DIAGNOSIS — Z6841 Body Mass Index (BMI) 40.0 and over, adult: Secondary | ICD-10-CM | POA: Diagnosis not present

## 2016-03-15 DIAGNOSIS — I10 Essential (primary) hypertension: Secondary | ICD-10-CM | POA: Diagnosis not present

## 2016-03-15 DIAGNOSIS — M25561 Pain in right knee: Secondary | ICD-10-CM | POA: Diagnosis not present

## 2016-03-15 DIAGNOSIS — M5442 Lumbago with sciatica, left side: Secondary | ICD-10-CM | POA: Diagnosis not present

## 2016-03-15 DIAGNOSIS — M5441 Lumbago with sciatica, right side: Secondary | ICD-10-CM | POA: Diagnosis not present

## 2016-03-17 DIAGNOSIS — Z96651 Presence of right artificial knee joint: Secondary | ICD-10-CM | POA: Diagnosis not present

## 2016-03-17 DIAGNOSIS — M1712 Unilateral primary osteoarthritis, left knee: Secondary | ICD-10-CM | POA: Diagnosis not present

## 2016-03-17 DIAGNOSIS — M7051 Other bursitis of knee, right knee: Secondary | ICD-10-CM | POA: Diagnosis not present

## 2016-03-19 DIAGNOSIS — R262 Difficulty in walking, not elsewhere classified: Secondary | ICD-10-CM | POA: Diagnosis not present

## 2016-03-19 DIAGNOSIS — M25561 Pain in right knee: Secondary | ICD-10-CM | POA: Diagnosis not present

## 2016-03-19 DIAGNOSIS — M62451 Contracture of muscle, right thigh: Secondary | ICD-10-CM | POA: Diagnosis not present

## 2016-03-19 DIAGNOSIS — M25461 Effusion, right knee: Secondary | ICD-10-CM | POA: Diagnosis not present

## 2016-03-24 DIAGNOSIS — M25461 Effusion, right knee: Secondary | ICD-10-CM | POA: Diagnosis not present

## 2016-03-24 DIAGNOSIS — M25561 Pain in right knee: Secondary | ICD-10-CM | POA: Diagnosis not present

## 2016-03-24 DIAGNOSIS — R262 Difficulty in walking, not elsewhere classified: Secondary | ICD-10-CM | POA: Diagnosis not present

## 2016-03-24 DIAGNOSIS — M62451 Contracture of muscle, right thigh: Secondary | ICD-10-CM | POA: Diagnosis not present

## 2016-03-25 DIAGNOSIS — F250 Schizoaffective disorder, bipolar type: Secondary | ICD-10-CM | POA: Diagnosis not present

## 2016-03-25 DIAGNOSIS — F423 Hoarding disorder: Secondary | ICD-10-CM | POA: Diagnosis not present

## 2016-03-29 ENCOUNTER — Ambulatory Visit (HOSPITAL_COMMUNITY)
Admission: EM | Admit: 2016-03-29 | Discharge: 2016-03-29 | Disposition: A | Discharge status: Home / Self Care | Payer: Medicare Other | Attending: Emergency Medicine | Admitting: Emergency Medicine

## 2016-03-29 ENCOUNTER — Encounter (HOSPITAL_COMMUNITY): Payer: Self-pay | Admitting: *Deleted

## 2016-03-29 DIAGNOSIS — G8929 Other chronic pain: Secondary | ICD-10-CM | POA: Diagnosis not present

## 2016-03-29 DIAGNOSIS — M25562 Pain in left knee: Secondary | ICD-10-CM

## 2016-03-29 DIAGNOSIS — M25561 Pain in right knee: Secondary | ICD-10-CM

## 2016-03-29 MED ORDER — KETOROLAC TROMETHAMINE 60 MG/2ML IM SOLN
60.0000 mg | Freq: Once | INTRAMUSCULAR | Status: AC
  Administered 2016-03-29: 60 mg via INTRAMUSCULAR

## 2016-03-29 MED ORDER — KETOROLAC TROMETHAMINE 60 MG/2ML IM SOLN
INTRAMUSCULAR | Status: AC
  Filled 2016-03-29: qty 2

## 2016-03-29 MED ORDER — PREDNISONE 10 MG (21) PO TBPK: ORAL_TABLET | ORAL | 0 refills | Status: DC

## 2016-03-29 NOTE — ED Provider Notes (Signed)
HPI  SUBJECTIVE:  Caroline Williams is a 55 y.o. female who presents with an acute flare of her chronic bilateral knee pain that she has had "for years". This is lasted for 3 days. Reports the pain as dull, achy, constant, and identical to previous knee pain. She states that this normally happens when the "weather changes". Symptoms are worse with movement, no alleviating factors. She has been to PT, tried ice, heat, OxyContin, Neurontin, morphine, nabumetone and Voltaren gel as prescribed to her by her orthopedic surgeon. She denies trauma, change in physical activity, swelling, redness, fevers, bruising, numbness, tingling, weakness in her legs. She states that usually Toradol works well for this pain. She has a past medical history including right total knee replacement status post revision in 2014, arthritis, hypothyroidism, diabetes, states that she does not need any medications anymore. It is not controlled through weight loss diet. However still checks her glucose on a regular basis. She has a roux en Y  Gastric bypass and is unable to take oral NSAIDs. No history of kidney disease. Orthopedics: Dr. Paralee Cancel.     Past Medical History:  Diagnosis Date  . Anxiety   . Arthritis   . Asthma   . Bipolar 1 disorder ([place])   . Breast discharge   . Breast lump   . Breast pain   . Depression   . Diabetes mellitus   . Fever   . GERD (gastroesophageal reflux disease)   . Graves disease   . Graves disease   . History of knee replacement, total   . Hypertension   . Hypothyroidism   . Pneumonia    2015,2014  . Sleep apnea   . Thyroid disease     Past Surgical History:  Procedure Laterality Date  . ABDOMINAL HYSTERECTOMY     partial  . APPENDECTOMY    . BREAST LUMPECTOMY     right  . CARDIAC CATHETERIZATION     no significant CAD, nl LV function by 11/08/06 cath  . CESAREAN SECTION    . GASTRIC ROUX-EN-Y N/A 11/25/2014   Procedure: LAPAROSCOPIC ROUX-EN-Y GASTRIC BYPASS WITH UPPER  ENDOSCOPY;  Surgeon: Johnathan Hausen, MD;  Location: [facility];  Service: General;  Laterality: N/A;  . HERNIA REPAIR    . JOINT REPLACEMENT    . KNEE SURGERY     right  . [place]  . [place]  . TOTAL KNEE REVISION  06/21/2012   Procedure: TOTAL KNEE REVISION;  Surgeon: Newt Minion, MD;  Location: [facility];  Service: Orthopedics;  Laterality: Right;  Revision Right Total Knee Arthroplasty    Family History  Problem Relation Age of Onset  . Cancer Father     colon  . Stroke Father   . Heart disease Father   . Diabetes Father   . Hypertension Father   . Depression Sister   . Hypertension Mother     Social History  Substance Use Topics  . Smoking status: Current Every Day Smoker    Packs/day: 0.50    Years: 4.00    Types: Cigarettes    Last attempt to quit: 09/13/2014  . Smokeless tobacco: Never Used  . Alcohol use No    No current facility-administered medications for this encounter.   Current Outpatient Prescriptions:  .  ADVAIR DISKUS 500-50 MCG/DOSE AEPB, Inhale 1 puff into the lungs 2 (two) times daily as needed (shortness of breath, wheezing). , Disp: , Rfl:  .  albuterol (PROVENTIL HFA;VENTOLIN HFA) 108 (90 BASE) MCG/ACT inhaler, Inhale 2 puffs into the lungs every 6 (six) hours as needed for wheezing or shortness of breath. Shortness of breath, Disp: , Rfl:  .  ALPRAZolam (XANAX) 1 MG tablet, Take 1 mg by mouth 4 (four) times daily. , Disp: , Rfl: 0 .  citalopram (CELEXA) 20 MG tablet, Take 20 mg by mouth daily., Disp: , Rfl: 1 .  Cyanocobalamin (B-12 PO), Take 1 tablet by mouth every morning. , Disp: , Rfl:  .  cyclobenzaprine (FLEXERIL) 10 MG tablet, Take 1 tablet (10 mg total) by mouth 2 (two) times daily as needed for muscle spasms (back pain)., Disp: 20 tablet, Rfl: 0 .  EPINEPHrine 0.3 mg/0.3 mL IJ SOAJ injection, Inject 0.3 mg as directed as needed (severe allergic reaction). , Disp: , Rfl:  .  fluticasone (FLONASE)  50 MCG/ACT nasal spray, Place 2 sprays into the nose daily as needed (congestion)., Disp: , Rfl:  .  gabapentin (NEURONTIN) 600 MG tablet, Take 600 mg by mouth 2 (two) times daily., Disp: , Rfl:  .  hydrochlorothiazide (MICROZIDE) 12.5 MG capsule, Take 1 capsule (12.5 mg total) by mouth daily., Disp: 30 capsule, Rfl: 11 .  levocetirizine (XYZAL) 5 MG tablet, Take 5 mg by mouth daily as needed for allergies. , Disp: , Rfl: 2 .  levothyroxine (SYNTHROID, LEVOTHROID) 125 MCG tablet, Take 125 mcg by mouth every morning. , Disp: , Rfl:  .  lidocaine (LIDODERM) 5 %, Place 1 patch onto the skin daily. Remove & Discard patch within 12 hours or as directed by MD, Disp: 30 patch, Rfl: 0 .  magnesium hydroxide (MILK OF MAGNESIA) 400 MG/5ML suspension, Take 15 mLs by mouth daily as needed for mild constipation., Disp: , Rfl:  .  methylphenidate (RITALIN) 20 MG tablet, Take 20 mg by mouth 2 (two) times daily with breakfast and lunch. , Disp: , Rfl:  .  morphine (MS CONTIN) 15 MG 12 hr tablet, Take 1 tablet (15 mg total) by mouth every 12 (twelve) hours., Disp: 60 tablet, Rfl: 0 .  Multiple Vitamin (MULTIVITAMIN) tablet, Take 1 tablet by mouth every morning. , Disp: , Rfl:  .  nabumetone (RELAFEN) 750 MG tablet, Take 750 mg by mouth 2 (two) times daily as needed for mild pain. , Disp: , Rfl:  .  nitroGLYCERIN (NITROSTAT) 0.4 MG SL tablet, DISSOLVE 1 TABLET UNDER THE TONGUE EVERY 5 MINUTES AS NEEDED, Disp: 300 tablet, Rfl: 3 .  Oxycodone HCl 10 MG TABS, Take 1 tablet (10 mg total) by mouth every 6 (six) hours as needed (for pain)., Disp: 120 tablet, Rfl: 0 .  predniSONE (STERAPRED UNI-PAK 21 TAB) 10 MG (21) TBPK tablet, Dispense one 6 day pack. Take as directed with food., Disp: 21 tablet, Rfl: 0 .  promethazine (PHENERGAN) 25 MG tablet, Take 1 tablet (25 mg total) by mouth every 6 (six) hours as needed for nausea or vomiting., Disp: 30 tablet, Rfl: 0 .  REXULTI 2 MG TABS, Take 2 mg by mouth at bedtime. , Disp: ,  Rfl: 2 .  VOLTAREN 1 % GEL, Apply 1 application topically 4 (four) times daily as needed (pain). , Disp: , Rfl: 11 .  zolpidem (AMBIEN) 10 MG tablet, Take 10 mg by mouth at bedtime. , Disp: , Rfl: 5  Allergies  Allergen Reactions  . Aspirin Anaphylaxis    Pt states she has had Toradol several times without any reactions  . Sulfa Antibiotics Anaphylaxis  .  Other     No blood products.  Patient did  Request that only albumin or albumin-containing products may be administered     ROS  As noted in HPI.   Physical Exam  BP 172/83 (BP Location: Right Arm)   Pulse 73   Temp 98.3 F (36.8 C) (Oral)   Resp 14   SpO2 100%   Constitutional: Well developed, well nourished, no acute distress Eyes:  EOMI, conjunctiva normal bilaterally HENT: Normocephalic, atraumatic,mucus membranes moist Respiratory: Normal inspiratory effort Cardiovascular: Normal rate GI: nondistended skin: No rash, skin intact Musculoskeletal:  L Knee ROM baseline for PT, Flexion/extension  intact, tenderness along the medial joint and along the patellar retinaculum,  Patella NT, Patellar tendon NT,  Lateral joint NT, Popliteal region NT, Lachman's stable, Varus LCL stress testing stable, Valgus MCL stress testing stable but painful, McMurray's testing normal, distal NVI with intact baseline sensation / motor / pulse distal to knee affected extremity. No effusion. No increased temperature.   R Knee ROM baseline for PT, Flexion/extension  intact , Tenderness medial joint and along the patellar retinaculum, Patella NT, Patellar tendon NT, Lateral joint NT, Popliteal region NT, Lachman's stable, Varus LCL stress testing stable but painful, Valgus MCL stress testing stable but painful, McMurray's testing abnormal, distal NVI with intact baseline sensation / motor / pulse distal to knee affected extremity. No effusion. No increased temperature.   Neurologic: Alert & oriented x 3, no focal neuro deficits Psychiatric: Speech  and behavior appropriate   ED Course   Medications  ketorolac (TORADOL) injection 60 mg (60 mg Intramuscular Given 03/29/16 2104)    No orders of the defined types were placed in this encounter.   No results found for this or any previous visit (from the past 24 hour(s)). No results found.  ED Clinical Impression  Bilateral chronic knee pain   ED Assessment/Plan  In the absence of any trauma, do not think that repeat imaging is needed today. Patient denies any signs of infection. Doubt gout she has no history of this and she states this pain is identical to previous pain that she has had in her knees. Gave Toradol here without improvement. Patient states that this normally works for her. She also states that prednisone sometimes helps. She is diabetic, but states that it is now controlled with diet and through weight loss. States that she does check her sugars on ongoing basis. Discussed with her that the prednisone will elevate her sugars while she is taking it. She is already on multiple opiates, Neurontin and Voltaren gel, discussed with her that I do not have anything else to offer her other than the steroids.  She will follow up with Dr. Adriana Mccallum if no better in 6 days. She agrees to go to the ER for any signs of infection, pain not controlled with medications, fevers above 100.4, redness or swelling of her joints, or other concerns.   Meds ordered this encounter  Medications  . ketorolac (TORADOL) injection 60 mg  . predniSONE (STERAPRED UNI-PAK 21 TAB) 10 MG (21) TBPK tablet    Sig: Dispense one 6 day pack. Take as directed with food.    Dispense:  21 tablet    Refill:  0    *This clinic note was created using [employer]. Therefore, there may be occasional mistakes despite careful proofreading.  ?   Melynda Ripple, MD 03/30/16 [phone]

## 2016-03-29 NOTE — ED Triage Notes (Signed)
Pt has   Arthritis    She  Reports  Pains   In   Both knee        She   denys  Any injury     She  Reports  Bone  On  Bone  Pain in her joints           She  Ambulated  Slowly to  The  Exam  Room       With  The  Aid  Of a  [place]

## 2016-04-07 DIAGNOSIS — N390 Urinary tract infection, site not specified: Secondary | ICD-10-CM | POA: Diagnosis not present

## 2016-04-07 DIAGNOSIS — M170 Bilateral primary osteoarthritis of knee: Secondary | ICD-10-CM | POA: Diagnosis not present

## 2016-04-07 DIAGNOSIS — Z23 Encounter for immunization: Secondary | ICD-10-CM | POA: Diagnosis not present

## 2016-04-08 DIAGNOSIS — M62451 Contracture of muscle, right thigh: Secondary | ICD-10-CM | POA: Diagnosis not present

## 2016-04-08 DIAGNOSIS — R262 Difficulty in walking, not elsewhere classified: Secondary | ICD-10-CM | POA: Diagnosis not present

## 2016-04-08 DIAGNOSIS — M25461 Effusion, right knee: Secondary | ICD-10-CM | POA: Diagnosis not present

## 2016-04-08 DIAGNOSIS — M25561 Pain in right knee: Secondary | ICD-10-CM | POA: Diagnosis not present

## 2016-04-12 ENCOUNTER — Ambulatory Visit (HOSPITAL_COMMUNITY): Payer: Self-pay

## 2016-04-13 DIAGNOSIS — M25461 Effusion, right knee: Secondary | ICD-10-CM | POA: Diagnosis not present

## 2016-04-13 DIAGNOSIS — M62451 Contracture of muscle, right thigh: Secondary | ICD-10-CM | POA: Diagnosis not present

## 2016-04-13 DIAGNOSIS — M25561 Pain in right knee: Secondary | ICD-10-CM | POA: Diagnosis not present

## 2016-04-13 DIAGNOSIS — R262 Difficulty in walking, not elsewhere classified: Secondary | ICD-10-CM | POA: Diagnosis not present

## 2016-04-14 ENCOUNTER — Ambulatory Visit (HOSPITAL_COMMUNITY): Payer: Self-pay

## 2016-04-22 DIAGNOSIS — M25561 Pain in right knee: Secondary | ICD-10-CM | POA: Diagnosis not present

## 2016-04-22 DIAGNOSIS — M62451 Contracture of muscle, right thigh: Secondary | ICD-10-CM | POA: Diagnosis not present

## 2016-04-22 DIAGNOSIS — M25461 Effusion, right knee: Secondary | ICD-10-CM | POA: Diagnosis not present

## 2016-04-22 DIAGNOSIS — R262 Difficulty in walking, not elsewhere classified: Secondary | ICD-10-CM | POA: Diagnosis not present

## 2016-04-23 DIAGNOSIS — M25561 Pain in right knee: Secondary | ICD-10-CM | POA: Diagnosis not present

## 2016-04-23 DIAGNOSIS — M25461 Effusion, right knee: Secondary | ICD-10-CM | POA: Diagnosis not present

## 2016-04-23 DIAGNOSIS — M62451 Contracture of muscle, right thigh: Secondary | ICD-10-CM | POA: Diagnosis not present

## 2016-04-23 DIAGNOSIS — R262 Difficulty in walking, not elsewhere classified: Secondary | ICD-10-CM | POA: Diagnosis not present

## 2016-04-27 DIAGNOSIS — M25561 Pain in right knee: Secondary | ICD-10-CM | POA: Diagnosis not present

## 2016-04-27 DIAGNOSIS — R262 Difficulty in walking, not elsewhere classified: Secondary | ICD-10-CM | POA: Diagnosis not present

## 2016-04-27 DIAGNOSIS — M62451 Contracture of muscle, right thigh: Secondary | ICD-10-CM | POA: Diagnosis not present

## 2016-04-27 DIAGNOSIS — M25461 Effusion, right knee: Secondary | ICD-10-CM | POA: Diagnosis not present

## 2016-04-30 DIAGNOSIS — M25561 Pain in right knee: Secondary | ICD-10-CM | POA: Diagnosis not present

## 2016-04-30 DIAGNOSIS — M62451 Contracture of muscle, right thigh: Secondary | ICD-10-CM | POA: Diagnosis not present

## 2016-04-30 DIAGNOSIS — M25461 Effusion, right knee: Secondary | ICD-10-CM | POA: Diagnosis not present

## 2016-04-30 DIAGNOSIS — R262 Difficulty in walking, not elsewhere classified: Secondary | ICD-10-CM | POA: Diagnosis not present

## 2016-05-11 DIAGNOSIS — E119 Type 2 diabetes mellitus without complications: Secondary | ICD-10-CM | POA: Diagnosis not present

## 2016-05-11 DIAGNOSIS — I10 Essential (primary) hypertension: Secondary | ICD-10-CM | POA: Diagnosis not present

## 2016-05-11 DIAGNOSIS — F3170 Bipolar disorder, currently in remission, most recent episode unspecified: Secondary | ICD-10-CM | POA: Diagnosis not present

## 2016-05-11 DIAGNOSIS — N63 Unspecified lump in unspecified breast: Secondary | ICD-10-CM | POA: Diagnosis not present

## 2016-05-11 DIAGNOSIS — G4730 Sleep apnea, unspecified: Secondary | ICD-10-CM | POA: Diagnosis not present

## 2016-05-11 DIAGNOSIS — N3090 Cystitis, unspecified without hematuria: Secondary | ICD-10-CM | POA: Diagnosis not present

## 2016-05-11 DIAGNOSIS — M150 Primary generalized (osteo)arthritis: Secondary | ICD-10-CM | POA: Diagnosis not present

## 2016-05-13 ENCOUNTER — Ambulatory Visit: Payer: Self-pay | Admitting: Interventional Cardiology

## 2016-05-14 DIAGNOSIS — M179 Osteoarthritis of knee, unspecified: Secondary | ICD-10-CM | POA: Diagnosis not present

## 2016-05-14 DIAGNOSIS — G894 Chronic pain syndrome: Secondary | ICD-10-CM | POA: Diagnosis not present

## 2016-05-15 DIAGNOSIS — M179 Osteoarthritis of knee, unspecified: Secondary | ICD-10-CM | POA: Diagnosis not present

## 2016-05-15 DIAGNOSIS — G894 Chronic pain syndrome: Secondary | ICD-10-CM | POA: Diagnosis not present

## 2016-05-16 DIAGNOSIS — M179 Osteoarthritis of knee, unspecified: Secondary | ICD-10-CM | POA: Diagnosis not present

## 2016-05-16 DIAGNOSIS — G894 Chronic pain syndrome: Secondary | ICD-10-CM | POA: Diagnosis not present

## 2016-05-17 DIAGNOSIS — M179 Osteoarthritis of knee, unspecified: Secondary | ICD-10-CM | POA: Diagnosis not present

## 2016-05-17 DIAGNOSIS — G894 Chronic pain syndrome: Secondary | ICD-10-CM | POA: Diagnosis not present

## 2016-05-18 DIAGNOSIS — M179 Osteoarthritis of knee, unspecified: Secondary | ICD-10-CM | POA: Diagnosis not present

## 2016-05-18 DIAGNOSIS — G894 Chronic pain syndrome: Secondary | ICD-10-CM | POA: Diagnosis not present

## 2016-05-19 DIAGNOSIS — M179 Osteoarthritis of knee, unspecified: Secondary | ICD-10-CM | POA: Diagnosis not present

## 2016-05-19 DIAGNOSIS — G894 Chronic pain syndrome: Secondary | ICD-10-CM | POA: Diagnosis not present

## 2016-05-20 DIAGNOSIS — M179 Osteoarthritis of knee, unspecified: Secondary | ICD-10-CM | POA: Diagnosis not present

## 2016-05-20 DIAGNOSIS — G894 Chronic pain syndrome: Secondary | ICD-10-CM | POA: Diagnosis not present

## 2016-05-21 DIAGNOSIS — M25461 Effusion, right knee: Secondary | ICD-10-CM | POA: Diagnosis not present

## 2016-05-21 DIAGNOSIS — G894 Chronic pain syndrome: Secondary | ICD-10-CM | POA: Diagnosis not present

## 2016-05-21 DIAGNOSIS — M62451 Contracture of muscle, right thigh: Secondary | ICD-10-CM | POA: Diagnosis not present

## 2016-05-21 DIAGNOSIS — M179 Osteoarthritis of knee, unspecified: Secondary | ICD-10-CM | POA: Diagnosis not present

## 2016-05-21 DIAGNOSIS — R262 Difficulty in walking, not elsewhere classified: Secondary | ICD-10-CM | POA: Diagnosis not present

## 2016-05-21 DIAGNOSIS — M25561 Pain in right knee: Secondary | ICD-10-CM | POA: Diagnosis not present

## 2016-05-24 DIAGNOSIS — M179 Osteoarthritis of knee, unspecified: Secondary | ICD-10-CM | POA: Diagnosis not present

## 2016-05-24 DIAGNOSIS — G894 Chronic pain syndrome: Secondary | ICD-10-CM | POA: Diagnosis not present

## 2016-05-25 DIAGNOSIS — M179 Osteoarthritis of knee, unspecified: Secondary | ICD-10-CM | POA: Diagnosis not present

## 2016-05-25 DIAGNOSIS — G894 Chronic pain syndrome: Secondary | ICD-10-CM | POA: Diagnosis not present

## 2016-05-26 DIAGNOSIS — G894 Chronic pain syndrome: Secondary | ICD-10-CM | POA: Diagnosis not present

## 2016-05-26 DIAGNOSIS — M179 Osteoarthritis of knee, unspecified: Secondary | ICD-10-CM | POA: Diagnosis not present

## 2016-05-27 DIAGNOSIS — G894 Chronic pain syndrome: Secondary | ICD-10-CM | POA: Diagnosis not present

## 2016-05-27 DIAGNOSIS — M1712 Unilateral primary osteoarthritis, left knee: Secondary | ICD-10-CM | POA: Diagnosis not present

## 2016-05-27 DIAGNOSIS — G8929 Other chronic pain: Secondary | ICD-10-CM | POA: Diagnosis not present

## 2016-05-27 DIAGNOSIS — M7051 Other bursitis of knee, right knee: Secondary | ICD-10-CM | POA: Diagnosis not present

## 2016-05-27 DIAGNOSIS — M179 Osteoarthritis of knee, unspecified: Secondary | ICD-10-CM | POA: Diagnosis not present

## 2016-05-27 DIAGNOSIS — M25562 Pain in left knee: Secondary | ICD-10-CM | POA: Diagnosis not present

## 2016-05-28 DIAGNOSIS — M179 Osteoarthritis of knee, unspecified: Secondary | ICD-10-CM | POA: Diagnosis not present

## 2016-05-28 DIAGNOSIS — G894 Chronic pain syndrome: Secondary | ICD-10-CM | POA: Diagnosis not present

## 2016-05-31 DIAGNOSIS — M179 Osteoarthritis of knee, unspecified: Secondary | ICD-10-CM | POA: Diagnosis not present

## 2016-05-31 DIAGNOSIS — G894 Chronic pain syndrome: Secondary | ICD-10-CM | POA: Diagnosis not present

## 2016-06-01 ENCOUNTER — Other Ambulatory Visit: Payer: Self-pay | Admitting: Family Medicine

## 2016-06-01 DIAGNOSIS — G894 Chronic pain syndrome: Secondary | ICD-10-CM | POA: Diagnosis not present

## 2016-06-01 DIAGNOSIS — M25461 Effusion, right knee: Secondary | ICD-10-CM | POA: Diagnosis not present

## 2016-06-01 DIAGNOSIS — M179 Osteoarthritis of knee, unspecified: Secondary | ICD-10-CM | POA: Diagnosis not present

## 2016-06-01 DIAGNOSIS — N63 Unspecified lump in unspecified breast: Secondary | ICD-10-CM

## 2016-06-01 DIAGNOSIS — R262 Difficulty in walking, not elsewhere classified: Secondary | ICD-10-CM | POA: Diagnosis not present

## 2016-06-01 DIAGNOSIS — M62451 Contracture of muscle, right thigh: Secondary | ICD-10-CM | POA: Diagnosis not present

## 2016-06-01 DIAGNOSIS — M25561 Pain in right knee: Secondary | ICD-10-CM | POA: Diagnosis not present

## 2016-06-02 DIAGNOSIS — M179 Osteoarthritis of knee, unspecified: Secondary | ICD-10-CM | POA: Diagnosis not present

## 2016-06-02 DIAGNOSIS — G894 Chronic pain syndrome: Secondary | ICD-10-CM | POA: Diagnosis not present

## 2016-06-03 DIAGNOSIS — G894 Chronic pain syndrome: Secondary | ICD-10-CM | POA: Diagnosis not present

## 2016-06-03 DIAGNOSIS — M179 Osteoarthritis of knee, unspecified: Secondary | ICD-10-CM | POA: Diagnosis not present

## 2016-06-03 DIAGNOSIS — F250 Schizoaffective disorder, bipolar type: Secondary | ICD-10-CM | POA: Diagnosis not present

## 2016-06-03 DIAGNOSIS — F900 Attention-deficit hyperactivity disorder, predominantly inattentive type: Secondary | ICD-10-CM | POA: Diagnosis not present

## 2016-06-03 DIAGNOSIS — F423 Hoarding disorder: Secondary | ICD-10-CM | POA: Diagnosis not present

## 2016-06-04 DIAGNOSIS — M62451 Contracture of muscle, right thigh: Secondary | ICD-10-CM | POA: Diagnosis not present

## 2016-06-04 DIAGNOSIS — M25561 Pain in right knee: Secondary | ICD-10-CM | POA: Diagnosis not present

## 2016-06-04 DIAGNOSIS — R262 Difficulty in walking, not elsewhere classified: Secondary | ICD-10-CM | POA: Diagnosis not present

## 2016-06-04 DIAGNOSIS — M179 Osteoarthritis of knee, unspecified: Secondary | ICD-10-CM | POA: Diagnosis not present

## 2016-06-04 DIAGNOSIS — G894 Chronic pain syndrome: Secondary | ICD-10-CM | POA: Diagnosis not present

## 2016-06-04 DIAGNOSIS — M25461 Effusion, right knee: Secondary | ICD-10-CM | POA: Diagnosis not present

## 2016-06-07 DIAGNOSIS — M179 Osteoarthritis of knee, unspecified: Secondary | ICD-10-CM | POA: Diagnosis not present

## 2016-06-07 DIAGNOSIS — G894 Chronic pain syndrome: Secondary | ICD-10-CM | POA: Diagnosis not present

## 2016-06-08 DIAGNOSIS — M179 Osteoarthritis of knee, unspecified: Secondary | ICD-10-CM | POA: Diagnosis not present

## 2016-06-08 DIAGNOSIS — G894 Chronic pain syndrome: Secondary | ICD-10-CM | POA: Diagnosis not present

## 2016-06-09 DIAGNOSIS — G894 Chronic pain syndrome: Secondary | ICD-10-CM | POA: Diagnosis not present

## 2016-06-09 DIAGNOSIS — M179 Osteoarthritis of knee, unspecified: Secondary | ICD-10-CM | POA: Diagnosis not present

## 2016-06-10 DIAGNOSIS — G894 Chronic pain syndrome: Secondary | ICD-10-CM | POA: Diagnosis not present

## 2016-06-10 DIAGNOSIS — M179 Osteoarthritis of knee, unspecified: Secondary | ICD-10-CM | POA: Diagnosis not present

## 2016-06-11 DIAGNOSIS — M25561 Pain in right knee: Secondary | ICD-10-CM | POA: Diagnosis not present

## 2016-06-11 DIAGNOSIS — M25461 Effusion, right knee: Secondary | ICD-10-CM | POA: Diagnosis not present

## 2016-06-11 DIAGNOSIS — R262 Difficulty in walking, not elsewhere classified: Secondary | ICD-10-CM | POA: Diagnosis not present

## 2016-06-11 DIAGNOSIS — M62451 Contracture of muscle, right thigh: Secondary | ICD-10-CM | POA: Diagnosis not present

## 2016-06-11 DIAGNOSIS — G894 Chronic pain syndrome: Secondary | ICD-10-CM | POA: Diagnosis not present

## 2016-06-11 DIAGNOSIS — M179 Osteoarthritis of knee, unspecified: Secondary | ICD-10-CM | POA: Diagnosis not present

## 2016-06-14 DIAGNOSIS — M179 Osteoarthritis of knee, unspecified: Secondary | ICD-10-CM | POA: Diagnosis not present

## 2016-06-14 DIAGNOSIS — G894 Chronic pain syndrome: Secondary | ICD-10-CM | POA: Diagnosis not present

## 2016-06-15 DIAGNOSIS — G894 Chronic pain syndrome: Secondary | ICD-10-CM | POA: Diagnosis not present

## 2016-06-15 DIAGNOSIS — M179 Osteoarthritis of knee, unspecified: Secondary | ICD-10-CM | POA: Diagnosis not present

## 2016-06-16 DIAGNOSIS — G894 Chronic pain syndrome: Secondary | ICD-10-CM | POA: Diagnosis not present

## 2016-06-16 DIAGNOSIS — M179 Osteoarthritis of knee, unspecified: Secondary | ICD-10-CM | POA: Diagnosis not present

## 2016-06-17 DIAGNOSIS — R262 Difficulty in walking, not elsewhere classified: Secondary | ICD-10-CM | POA: Diagnosis not present

## 2016-06-17 DIAGNOSIS — M25461 Effusion, right knee: Secondary | ICD-10-CM | POA: Diagnosis not present

## 2016-06-17 DIAGNOSIS — G894 Chronic pain syndrome: Secondary | ICD-10-CM | POA: Diagnosis not present

## 2016-06-17 DIAGNOSIS — I10 Essential (primary) hypertension: Secondary | ICD-10-CM | POA: Diagnosis not present

## 2016-06-17 DIAGNOSIS — M62451 Contracture of muscle, right thigh: Secondary | ICD-10-CM | POA: Diagnosis not present

## 2016-06-17 DIAGNOSIS — M5442 Lumbago with sciatica, left side: Secondary | ICD-10-CM | POA: Diagnosis not present

## 2016-06-17 DIAGNOSIS — M25561 Pain in right knee: Secondary | ICD-10-CM | POA: Diagnosis not present

## 2016-06-17 DIAGNOSIS — M179 Osteoarthritis of knee, unspecified: Secondary | ICD-10-CM | POA: Diagnosis not present

## 2016-06-17 DIAGNOSIS — Z6841 Body Mass Index (BMI) 40.0 and over, adult: Secondary | ICD-10-CM | POA: Diagnosis not present

## 2016-06-18 DIAGNOSIS — M179 Osteoarthritis of knee, unspecified: Secondary | ICD-10-CM | POA: Diagnosis not present

## 2016-06-18 DIAGNOSIS — G894 Chronic pain syndrome: Secondary | ICD-10-CM | POA: Diagnosis not present

## 2016-06-19 DIAGNOSIS — G894 Chronic pain syndrome: Secondary | ICD-10-CM | POA: Diagnosis not present

## 2016-06-19 DIAGNOSIS — M179 Osteoarthritis of knee, unspecified: Secondary | ICD-10-CM | POA: Diagnosis not present

## 2016-06-20 DIAGNOSIS — G894 Chronic pain syndrome: Secondary | ICD-10-CM | POA: Diagnosis not present

## 2016-06-20 DIAGNOSIS — M179 Osteoarthritis of knee, unspecified: Secondary | ICD-10-CM | POA: Diagnosis not present

## 2016-06-21 DIAGNOSIS — G894 Chronic pain syndrome: Secondary | ICD-10-CM | POA: Diagnosis not present

## 2016-06-21 DIAGNOSIS — M179 Osteoarthritis of knee, unspecified: Secondary | ICD-10-CM | POA: Diagnosis not present

## 2016-06-22 DIAGNOSIS — M179 Osteoarthritis of knee, unspecified: Secondary | ICD-10-CM | POA: Diagnosis not present

## 2016-06-22 DIAGNOSIS — M25561 Pain in right knee: Secondary | ICD-10-CM | POA: Diagnosis not present

## 2016-06-22 DIAGNOSIS — M62451 Contracture of muscle, right thigh: Secondary | ICD-10-CM | POA: Diagnosis not present

## 2016-06-22 DIAGNOSIS — G894 Chronic pain syndrome: Secondary | ICD-10-CM | POA: Diagnosis not present

## 2016-06-22 DIAGNOSIS — R262 Difficulty in walking, not elsewhere classified: Secondary | ICD-10-CM | POA: Diagnosis not present

## 2016-06-22 DIAGNOSIS — M25461 Effusion, right knee: Secondary | ICD-10-CM | POA: Diagnosis not present

## 2016-06-23 DIAGNOSIS — M179 Osteoarthritis of knee, unspecified: Secondary | ICD-10-CM | POA: Diagnosis not present

## 2016-06-23 DIAGNOSIS — G894 Chronic pain syndrome: Secondary | ICD-10-CM | POA: Diagnosis not present

## 2016-06-24 DIAGNOSIS — G894 Chronic pain syndrome: Secondary | ICD-10-CM | POA: Diagnosis not present

## 2016-06-24 DIAGNOSIS — F250 Schizoaffective disorder, bipolar type: Secondary | ICD-10-CM | POA: Diagnosis not present

## 2016-06-24 DIAGNOSIS — F900 Attention-deficit hyperactivity disorder, predominantly inattentive type: Secondary | ICD-10-CM | POA: Diagnosis not present

## 2016-06-24 DIAGNOSIS — M179 Osteoarthritis of knee, unspecified: Secondary | ICD-10-CM | POA: Diagnosis not present

## 2016-06-25 DIAGNOSIS — M179 Osteoarthritis of knee, unspecified: Secondary | ICD-10-CM | POA: Diagnosis not present

## 2016-06-25 DIAGNOSIS — G894 Chronic pain syndrome: Secondary | ICD-10-CM | POA: Diagnosis not present

## 2016-06-28 DIAGNOSIS — M179 Osteoarthritis of knee, unspecified: Secondary | ICD-10-CM | POA: Diagnosis not present

## 2016-06-28 DIAGNOSIS — G894 Chronic pain syndrome: Secondary | ICD-10-CM | POA: Diagnosis not present

## 2016-06-29 ENCOUNTER — Encounter (HOSPITAL_COMMUNITY): Payer: Self-pay

## 2016-06-29 DIAGNOSIS — M179 Osteoarthritis of knee, unspecified: Secondary | ICD-10-CM | POA: Diagnosis not present

## 2016-06-29 DIAGNOSIS — G894 Chronic pain syndrome: Secondary | ICD-10-CM | POA: Diagnosis not present

## 2016-06-30 DIAGNOSIS — M179 Osteoarthritis of knee, unspecified: Secondary | ICD-10-CM | POA: Diagnosis not present

## 2016-06-30 DIAGNOSIS — G894 Chronic pain syndrome: Secondary | ICD-10-CM | POA: Diagnosis not present

## 2016-07-01 DIAGNOSIS — M179 Osteoarthritis of knee, unspecified: Secondary | ICD-10-CM | POA: Diagnosis not present

## 2016-07-01 DIAGNOSIS — G894 Chronic pain syndrome: Secondary | ICD-10-CM | POA: Diagnosis not present

## 2016-07-02 DIAGNOSIS — M179 Osteoarthritis of knee, unspecified: Secondary | ICD-10-CM | POA: Diagnosis not present

## 2016-07-02 DIAGNOSIS — G894 Chronic pain syndrome: Secondary | ICD-10-CM | POA: Diagnosis not present

## 2016-07-05 DIAGNOSIS — G894 Chronic pain syndrome: Secondary | ICD-10-CM | POA: Diagnosis not present

## 2016-07-05 DIAGNOSIS — M179 Osteoarthritis of knee, unspecified: Secondary | ICD-10-CM | POA: Diagnosis not present

## 2016-07-06 DIAGNOSIS — G894 Chronic pain syndrome: Secondary | ICD-10-CM | POA: Diagnosis not present

## 2016-07-06 DIAGNOSIS — M179 Osteoarthritis of knee, unspecified: Secondary | ICD-10-CM | POA: Diagnosis not present

## 2016-07-07 DIAGNOSIS — M179 Osteoarthritis of knee, unspecified: Secondary | ICD-10-CM | POA: Diagnosis not present

## 2016-07-07 DIAGNOSIS — G894 Chronic pain syndrome: Secondary | ICD-10-CM | POA: Diagnosis not present

## 2016-07-08 DIAGNOSIS — M179 Osteoarthritis of knee, unspecified: Secondary | ICD-10-CM | POA: Diagnosis not present

## 2016-07-08 DIAGNOSIS — M62451 Contracture of muscle, right thigh: Secondary | ICD-10-CM | POA: Diagnosis not present

## 2016-07-08 DIAGNOSIS — M25561 Pain in right knee: Secondary | ICD-10-CM | POA: Diagnosis not present

## 2016-07-08 DIAGNOSIS — R262 Difficulty in walking, not elsewhere classified: Secondary | ICD-10-CM | POA: Diagnosis not present

## 2016-07-08 DIAGNOSIS — G894 Chronic pain syndrome: Secondary | ICD-10-CM | POA: Diagnosis not present

## 2016-07-08 DIAGNOSIS — M25461 Effusion, right knee: Secondary | ICD-10-CM | POA: Diagnosis not present

## 2016-07-09 DIAGNOSIS — M179 Osteoarthritis of knee, unspecified: Secondary | ICD-10-CM | POA: Diagnosis not present

## 2016-07-09 DIAGNOSIS — M6258 Muscle wasting and atrophy, not elsewhere classified, other site: Secondary | ICD-10-CM | POA: Diagnosis not present

## 2016-07-09 DIAGNOSIS — G894 Chronic pain syndrome: Secondary | ICD-10-CM | POA: Diagnosis not present

## 2016-07-12 DIAGNOSIS — G894 Chronic pain syndrome: Secondary | ICD-10-CM | POA: Diagnosis not present

## 2016-07-12 DIAGNOSIS — M179 Osteoarthritis of knee, unspecified: Secondary | ICD-10-CM | POA: Diagnosis not present

## 2016-07-13 DIAGNOSIS — G894 Chronic pain syndrome: Secondary | ICD-10-CM | POA: Diagnosis not present

## 2016-07-13 DIAGNOSIS — M179 Osteoarthritis of knee, unspecified: Secondary | ICD-10-CM | POA: Diagnosis not present

## 2016-07-14 DIAGNOSIS — M179 Osteoarthritis of knee, unspecified: Secondary | ICD-10-CM | POA: Diagnosis not present

## 2016-07-14 DIAGNOSIS — G894 Chronic pain syndrome: Secondary | ICD-10-CM | POA: Diagnosis not present

## 2016-07-15 ENCOUNTER — Other Ambulatory Visit: Payer: Self-pay | Admitting: Family Medicine

## 2016-07-15 DIAGNOSIS — N63 Unspecified lump in unspecified breast: Secondary | ICD-10-CM

## 2016-07-15 DIAGNOSIS — M179 Osteoarthritis of knee, unspecified: Secondary | ICD-10-CM | POA: Diagnosis not present

## 2016-07-15 DIAGNOSIS — G894 Chronic pain syndrome: Secondary | ICD-10-CM | POA: Diagnosis not present

## 2016-07-16 DIAGNOSIS — M179 Osteoarthritis of knee, unspecified: Secondary | ICD-10-CM | POA: Diagnosis not present

## 2016-07-16 DIAGNOSIS — G894 Chronic pain syndrome: Secondary | ICD-10-CM | POA: Diagnosis not present

## 2016-07-17 DIAGNOSIS — G894 Chronic pain syndrome: Secondary | ICD-10-CM | POA: Diagnosis not present

## 2016-07-17 DIAGNOSIS — M179 Osteoarthritis of knee, unspecified: Secondary | ICD-10-CM | POA: Diagnosis not present

## 2016-07-18 DIAGNOSIS — M179 Osteoarthritis of knee, unspecified: Secondary | ICD-10-CM | POA: Diagnosis not present

## 2016-07-18 DIAGNOSIS — G894 Chronic pain syndrome: Secondary | ICD-10-CM | POA: Diagnosis not present

## 2016-07-19 ENCOUNTER — Encounter (HOSPITAL_COMMUNITY): Payer: Self-pay | Admitting: Emergency Medicine

## 2016-07-19 ENCOUNTER — Emergency Department (HOSPITAL_COMMUNITY)
Admission: EM | Admit: 2016-07-19 | Discharge: 2016-07-19 | Disposition: A | Discharge status: Home / Self Care | Payer: Medicare HMO | Attending: Emergency Medicine | Admitting: Emergency Medicine

## 2016-07-19 DIAGNOSIS — M179 Osteoarthritis of knee, unspecified: Secondary | ICD-10-CM | POA: Diagnosis not present

## 2016-07-19 DIAGNOSIS — M791 Myalgia: Secondary | ICD-10-CM | POA: Diagnosis not present

## 2016-07-19 DIAGNOSIS — E039 Hypothyroidism, unspecified: Secondary | ICD-10-CM | POA: Insufficient documentation

## 2016-07-19 DIAGNOSIS — R197 Diarrhea, unspecified: Secondary | ICD-10-CM | POA: Insufficient documentation

## 2016-07-19 DIAGNOSIS — K2970 Gastritis, unspecified, without bleeding: Secondary | ICD-10-CM | POA: Diagnosis not present

## 2016-07-19 DIAGNOSIS — B349 Viral infection, unspecified: Secondary | ICD-10-CM

## 2016-07-19 DIAGNOSIS — Z96651 Presence of right artificial knee joint: Secondary | ICD-10-CM | POA: Diagnosis not present

## 2016-07-19 DIAGNOSIS — R112 Nausea with vomiting, unspecified: Secondary | ICD-10-CM | POA: Insufficient documentation

## 2016-07-19 DIAGNOSIS — E119 Type 2 diabetes mellitus without complications: Secondary | ICD-10-CM | POA: Insufficient documentation

## 2016-07-19 DIAGNOSIS — I10 Essential (primary) hypertension: Secondary | ICD-10-CM | POA: Diagnosis not present

## 2016-07-19 DIAGNOSIS — F17210 Nicotine dependence, cigarettes, uncomplicated: Secondary | ICD-10-CM | POA: Diagnosis not present

## 2016-07-19 DIAGNOSIS — G894 Chronic pain syndrome: Secondary | ICD-10-CM | POA: Diagnosis not present

## 2016-07-19 DIAGNOSIS — R109 Unspecified abdominal pain: Secondary | ICD-10-CM | POA: Diagnosis present

## 2016-07-19 LAB — URINALYSIS, ROUTINE W REFLEX MICROSCOPIC
Bilirubin Urine: NEGATIVE
Glucose, UA: NEGATIVE mg/dL
Ketones, ur: 5 mg/dL — AB
Leukocytes, UA: NEGATIVE
Nitrite: NEGATIVE
Protein, ur: NEGATIVE mg/dL
Specific Gravity, Urine: 1.009 (ref 1.005–1.030)
pH: 7 (ref 5.0–8.0)

## 2016-07-19 LAB — INFLUENZA PANEL BY PCR (TYPE A & B)
Influenza A By PCR: NEGATIVE
Influenza B By PCR: NEGATIVE

## 2016-07-19 MED ORDER — ONDANSETRON HCL 4 MG/2ML IJ SOLN
4.0000 mg | Freq: Once | INTRAMUSCULAR | Status: AC
  Administered 2016-07-19: 4 mg via INTRAVENOUS
  Filled 2016-07-19: qty 2

## 2016-07-19 MED ORDER — BISMUTH SUBSALICYLATE 262 MG/15ML PO SUSP
30.0000 mL | Freq: Once | ORAL | Status: AC
  Administered 2016-07-19: 30 mL via ORAL
  Filled 2016-07-19: qty 118

## 2016-07-19 MED ORDER — ACETAMINOPHEN 500 MG PO TABS
1000.0000 mg | ORAL_TABLET | Freq: Once | ORAL | Status: AC
  Administered 2016-07-19: 1000 mg via ORAL
  Filled 2016-07-19: qty 2

## 2016-07-19 MED ORDER — IBUPROFEN 800 MG PO TABS: 800.0000 mg | ORAL_TABLET | Freq: Once | ORAL | Status: DC

## 2016-07-19 MED ORDER — SODIUM CHLORIDE 0.9 % IV BOLUS (SEPSIS)
1000.0000 mL | Freq: Once | INTRAVENOUS | Status: AC
  Administered 2016-07-19: 1000 mL via INTRAVENOUS

## 2016-07-19 NOTE — ED Triage Notes (Signed)
Per EMS, pt from home.  Pt with abdominal pain, n/v/d x 2 days.  Pt took 1500mg  tylenol at 2 am for fever of "103".  Vitals: 182/118 (has not had htn meds since Friday). Hr 82, resp 18, cbg 123

## 2016-07-19 NOTE — ED Notes (Addendum)
Pt was walking to restroom with RN. Tech met pt in bathroom to help assist. There were not hats in the restroom to catch the urine sample so I asked RN to assist in getting one for me while I watch pt so she would not fall. I asked pt to hold for just a second while RN went to retrieve hat. Pt stated that she could not hold it and pulled her pants down while door was still open so I shut door for pt's privacy. I did inform pt that the MD did request for a urine sample so that we could figure out what was making her sick. Pt was not receptive and decided to call me a Bitch and raise her voice. No longer allowed to assist pt.

## 2016-07-19 NOTE — ED Notes (Signed)
DELAY IN DISCHARGE. PT REQUESTED TO USE BATHROOM BEFORE BEING DISCHARGE.

## 2016-07-19 NOTE — ED Notes (Signed)
Pt would not wait for RN to get hat for toilet to get urine sample. Will try again after fluids.

## 2016-07-19 NOTE — ED Notes (Signed)
Pt reports she took 975mg  acetaminophen this am, not 1500mg .

## 2016-07-19 NOTE — ED Provider Notes (Signed)
[facility] DEPT Provider Note   CSN: [id number] Arrival date & time: 07/19/16  0819     History   Chief Complaint Chief Complaint  Patient presents with  . Abdominal Pain  . Emesis    HPI Caroline Williams is a 56 y.o. female.  The history is provided by the patient.  Emesis   This is a new problem. The current episode started 2 days ago. The problem occurs 2 to 4 times per day. The problem has not changed since onset.The emesis has an appearance of stomach contents. The maximum temperature recorded prior to her arrival was 103 to 49 F (earlier today). Fever duration: took tylenol at 2 AM without recurrence of fever. Associated symptoms include chills, cough, diarrhea (2 days) and myalgias.    Past Medical History:  Diagnosis Date  . Anxiety   . Arthritis   . Asthma   . Bipolar 1 disorder ([place])   . Breast discharge   . Breast lump   . Breast pain   . Depression   . Diabetes mellitus   . Fever   . GERD (gastroesophageal reflux disease)   . Graves disease   . Graves disease   . History of knee replacement, total   . Hypertension   . Hypothyroidism   . Pneumonia    2015,2014  . Sleep apnea   . Thyroid disease     Patient Active Problem List   Diagnosis Date Noted  . Chest pain 01/26/2016  . Sepsis due to Escherichia coli ([place])   . E coli bacteremia   . E. coli UTI   . Hallucination, drug-induced ([place]) 03/12/2015  . Leukocytosis 03/12/2015  . Hypokalemia 03/12/2015  . Hypothyroidism 03/12/2015  . UTI (urinary tract infection) 03/12/2015  . Sepsis due to Gram negative bacteria ([place])   . Gram-negative bacteremia 03/11/2015  . Lap gastric bypass June 2016 11/25/2014  . Diabetes mellitus with neuropathy ([place]) 06/20/2013  . Chronic pain of right knee 06/20/2013  . Morbid obesity ([place]) 07/05/2012    Past Surgical History:  Procedure Laterality Date  . ABDOMINAL HYSTERECTOMY     partial  . APPENDECTOMY    . BREAST LUMPECTOMY     right  . CARDIAC  CATHETERIZATION     no significant CAD, nl LV function by 11/08/06 cath  . CESAREAN SECTION    . GASTRIC ROUX-EN-Y N/A 11/25/2014   Procedure: LAPAROSCOPIC ROUX-EN-Y GASTRIC BYPASS WITH UPPER ENDOSCOPY;  Surgeon: Johnathan Hausen, MD;  Location: [facility];  Service: General;  Laterality: N/A;  . HERNIA REPAIR    . JOINT REPLACEMENT    . KNEE SURGERY     right  . [place]  . [place]  . TOTAL KNEE REVISION  06/21/2012   Procedure: TOTAL KNEE REVISION;  Surgeon: Newt Minion, MD;  Location: [facility];  Service: Orthopedics;  Laterality: Right;  Revision Right Total Knee Arthroplasty    OB History    No data available       Home Medications    Prior to Admission medications   Medication Sig Start Date End Date Taking? Authorizing Provider  ADVAIR DISKUS 500-50 MCG/DOSE AEPB Inhale 1 puff into the lungs 2 (two) times daily as needed (shortness of breath, wheezing).  12/05/13  Yes Historical Provider, MD  albuterol (PROVENTIL HFA;VENTOLIN HFA) 108 (90 BASE) MCG/ACT inhaler Inhale 2 puffs into the lungs every 6 (six) hours as needed for wheezing or shortness of breath. Shortness of  breath   Yes Historical Provider, MD  ALPRAZolam (XANAX) 1 MG tablet Take 1 mg by mouth 4 (four) times daily.  07/02/14  Yes Historical Provider, MD  amLODipine (NORVASC) 5 MG tablet Take 5 mg by mouth daily. 06/15/16  Yes Historical Provider, MD  amphetamine-dextroamphetamine (ADDERALL XR) 30 MG 24 hr capsule Take 30 mg by mouth daily. 06/25/16  Yes Historical Provider, MD  citalopram (CELEXA) 40 MG tablet Take 40 mg by mouth at bedtime. 06/24/16  Yes Historical Provider, MD  cyclobenzaprine (FLEXERIL) 10 MG tablet Take 1 tablet (10 mg total) by mouth 2 (two) times daily as needed for muscle spasms (back pain). 12/21/15  Yes Rhetta Mura Schorr, NP  EPINEPHrine 0.3 mg/0.3 mL IJ SOAJ injection Inject 0.3 mg as directed as needed (severe allergic reaction).  01/11/14  Yes Historical  Provider, MD  fluticasone (FLONASE) 50 MCG/ACT nasal spray Place 2 sprays into the nose daily as needed (congestion).   Yes Historical Provider, MD  gabapentin (NEURONTIN) 600 MG tablet Take 600 mg by mouth 2 (two) times daily.   Yes Historical Provider, MD  hydrochlorothiazide (MICROZIDE) 12.5 MG capsule Take 1 capsule (12.5 mg total) by mouth daily. 01/26/16 07/19/16 Yes Belva Crome, MD  levocetirizine (XYZAL) 5 MG tablet Take 5 mg by mouth daily as needed for allergies.  06/29/14  Yes Historical Provider, MD  levothyroxine (SYNTHROID, LEVOTHROID) 125 MCG tablet Take 125 mcg by mouth every morning.    Yes Historical Provider, MD  morphine (MS CONTIN) 15 MG 12 hr tablet Take 1 tablet (15 mg total) by mouth every 12 (twelve) hours. 11/14/14  Yes Bayard Hugger, NP  Multiple Vitamin (MULTIVITAMIN) tablet Take 1 tablet by mouth every morning.    Yes Historical Provider, MD  nabumetone (RELAFEN) 750 MG tablet Take 750 mg by mouth 2 (two) times daily as needed for mild pain.  10/04/13  Yes Historical Provider, MD  nitroGLYCERIN (NITROSTAT) 0.4 MG SL tablet DISSOLVE 1 TABLET UNDER THE TONGUE EVERY 5 MINUTES AS NEEDED Patient taking differently: DISSOLVE 1 TABLET UNDER THE TONGUE EVERY 5 MINUTES AS NEEDED FOR CHEST PAIN 01/27/16  Yes Belva Crome, MD  Oxycodone HCl 10 MG TABS Take 1 tablet (10 mg total) by mouth every 6 (six) hours as needed (for pain). 11/14/14  Yes Bayard Hugger, NP  REXULTI 2 MG TABS Take 2 mg by mouth at bedtime.  06/18/14  Yes Historical Provider, MD  simvastatin (ZOCOR) 40 MG tablet Take 40 mg by mouth every evening. 06/17/16  Yes Historical Provider, MD  valsartan (DIOVAN) 160 MG tablet Take 160 mg by mouth daily. 06/17/16  Yes Historical Provider, MD  VOLTAREN 1 % GEL Apply 1 application topically 4 (four) times daily as needed (pain).  01/23/15  Yes Historical Provider, MD  zolpidem (AMBIEN) 10 MG tablet Take 10 mg by mouth at bedtime.  09/07/14  Yes Historical Provider, MD  lidocaine  (LIDODERM) 5 % Place 1 patch onto the skin daily. Remove & Discard patch within 12 hours or as directed by MD Patient not taking: Reported on 07/19/2016 09/07/15   Melony Overly, MD    Family History Family History  Problem Relation Age of Onset  . Cancer Father     colon  . Stroke Father   . Heart disease Father   . Diabetes Father   . Hypertension Father   . Depression Sister   . Hypertension Mother     Social History Social History  Substance Use  Topics  . Smoking status: Current Every Day Smoker    Packs/day: 0.50    Years: 4.00    Types: Cigarettes    Last attempt to quit: 09/13/2014  . Smokeless tobacco: Never Used  . Alcohol use No     Allergies   Aspirin; Sulfa antibiotics; and Other   Review of Systems Review of Systems  Constitutional: Positive for chills.  Respiratory: Positive for cough.   Gastrointestinal: Positive for diarrhea (2 days) and vomiting.  Musculoskeletal: Positive for myalgias.  All other systems reviewed and are negative.    Physical Exam Updated Vital Signs BP 160/95   Pulse 67   Temp 98.8 F (37.1 C) (Oral)   Resp 15   SpO2 97%   Physical Exam  Constitutional: She is oriented to person, place, and time. She appears well-developed and well-nourished. No distress.  HENT:  Head: Normocephalic.  Nose: Nose normal.  Eyes: Conjunctivae are normal.  Neck: Neck supple. No tracheal deviation present.  Cardiovascular: Normal rate, regular rhythm and normal heart sounds.   Pulmonary/Chest: Effort normal and breath sounds normal. No respiratory distress. She has no wheezes. She has no rales.  Abdominal: Soft. She exhibits no distension. There is no tenderness. There is no rebound and no guarding.  Neurological: She is alert and oriented to person, place, and time.  Skin: Skin is warm and dry.  Psychiatric: She has a normal mood and affect.  Vitals reviewed.    ED Treatments / Results  Labs (all labs ordered are listed, but only  abnormal results are displayed) Labs Reviewed  URINALYSIS, ROUTINE W REFLEX MICROSCOPIC - Abnormal; Notable for the following:       Result Value   Hgb urine dipstick MODERATE (*)    Ketones, ur 5 (*)    Bacteria, UA FEW (*)    Squamous Epithelial / LPF 0-5 (*)    All other components within normal limits  INFLUENZA PANEL BY PCR (TYPE A & B)    EKG  EKG Interpretation None       Radiology No results found.  Procedures Procedures (including critical care time)  Medications Ordered in ED Medications  acetaminophen (TYLENOL) tablet 1,000 mg (not administered)  bismuth subsalicylate (PEPTO BISMOL) 262 MG/15ML suspension 30 mL (not administered)  sodium chloride 0.9 % bolus 1,000 mL (not administered)  ondansetron (ZOFRAN) injection 4 mg (not administered)     Initial Impression / Assessment and Plan / ED Course  I have reviewed the triage vital signs and the nursing notes.  Pertinent labs & imaging results that were available during my care of the patient were reviewed by me and considered in my medical decision making (see chart for details).     56 year old female presents with nausea, vomiting, diarrhea, cough and body aches over the last 48 hours. She states she had a fever overnight 8 hours ago that was responsive to Tylenol. She is afebrile with stable vital signs here. She is requesting IV fluids. I explained that this was not medically necessary but I was happy to do this if she thought it would make her feel better. Given Tylenol for supportive care. Discussed influenza testing given her presentation within the window for Tamiflu treatment. Suspect viral illness which will be self-limited in nature. Has history of urinary tract infection with Escherichia coli bacteremia sepsis so urinalysis ordered for further evaluation with tertiary complaint of urinary frequency.  Improved with supportive care, AFVSS throughout visit. Plan to follow up with PCP as needed  and return  precautions discussed for worsening or new concerning symptoms.   Final Clinical Impressions(s) / ED Diagnoses   Final diagnoses:  Nausea vomiting and diarrhea  Viral illness    New Prescriptions New Prescriptions   No medications on file     Leo Grosser, MD 07/19/16 2000

## 2016-07-19 NOTE — ED Notes (Signed)
Bed: [id number] Expected date:  Expected time:  Means of arrival:  Comments: EMS n/v

## 2016-07-20 DIAGNOSIS — G894 Chronic pain syndrome: Secondary | ICD-10-CM | POA: Diagnosis not present

## 2016-07-20 DIAGNOSIS — M179 Osteoarthritis of knee, unspecified: Secondary | ICD-10-CM | POA: Diagnosis not present

## 2016-07-21 DIAGNOSIS — M179 Osteoarthritis of knee, unspecified: Secondary | ICD-10-CM | POA: Diagnosis not present

## 2016-07-21 DIAGNOSIS — G894 Chronic pain syndrome: Secondary | ICD-10-CM | POA: Diagnosis not present

## 2016-07-22 DIAGNOSIS — M25561 Pain in right knee: Secondary | ICD-10-CM | POA: Diagnosis not present

## 2016-07-22 DIAGNOSIS — G894 Chronic pain syndrome: Secondary | ICD-10-CM | POA: Diagnosis not present

## 2016-07-22 DIAGNOSIS — M25461 Effusion, right knee: Secondary | ICD-10-CM | POA: Diagnosis not present

## 2016-07-22 DIAGNOSIS — M179 Osteoarthritis of knee, unspecified: Secondary | ICD-10-CM | POA: Diagnosis not present

## 2016-07-22 DIAGNOSIS — M62451 Contracture of muscle, right thigh: Secondary | ICD-10-CM | POA: Diagnosis not present

## 2016-07-22 DIAGNOSIS — R262 Difficulty in walking, not elsewhere classified: Secondary | ICD-10-CM | POA: Diagnosis not present

## 2016-07-23 DIAGNOSIS — G894 Chronic pain syndrome: Secondary | ICD-10-CM | POA: Diagnosis not present

## 2016-07-23 DIAGNOSIS — M179 Osteoarthritis of knee, unspecified: Secondary | ICD-10-CM | POA: Diagnosis not present

## 2016-07-24 DIAGNOSIS — M179 Osteoarthritis of knee, unspecified: Secondary | ICD-10-CM | POA: Diagnosis not present

## 2016-07-24 DIAGNOSIS — G894 Chronic pain syndrome: Secondary | ICD-10-CM | POA: Diagnosis not present

## 2016-07-25 DIAGNOSIS — G894 Chronic pain syndrome: Secondary | ICD-10-CM | POA: Diagnosis not present

## 2016-07-25 DIAGNOSIS — M179 Osteoarthritis of knee, unspecified: Secondary | ICD-10-CM | POA: Diagnosis not present

## 2016-07-26 DIAGNOSIS — M179 Osteoarthritis of knee, unspecified: Secondary | ICD-10-CM | POA: Diagnosis not present

## 2016-07-26 DIAGNOSIS — G894 Chronic pain syndrome: Secondary | ICD-10-CM | POA: Diagnosis not present

## 2016-07-27 DIAGNOSIS — G894 Chronic pain syndrome: Secondary | ICD-10-CM | POA: Diagnosis not present

## 2016-07-27 DIAGNOSIS — M179 Osteoarthritis of knee, unspecified: Secondary | ICD-10-CM | POA: Diagnosis not present

## 2016-07-28 DIAGNOSIS — M179 Osteoarthritis of knee, unspecified: Secondary | ICD-10-CM | POA: Diagnosis not present

## 2016-07-28 DIAGNOSIS — G894 Chronic pain syndrome: Secondary | ICD-10-CM | POA: Diagnosis not present

## 2016-07-29 DIAGNOSIS — M179 Osteoarthritis of knee, unspecified: Secondary | ICD-10-CM | POA: Diagnosis not present

## 2016-07-29 DIAGNOSIS — G894 Chronic pain syndrome: Secondary | ICD-10-CM | POA: Diagnosis not present

## 2016-07-30 DIAGNOSIS — G894 Chronic pain syndrome: Secondary | ICD-10-CM | POA: Diagnosis not present

## 2016-07-30 DIAGNOSIS — M179 Osteoarthritis of knee, unspecified: Secondary | ICD-10-CM | POA: Diagnosis not present

## 2016-07-31 DIAGNOSIS — G894 Chronic pain syndrome: Secondary | ICD-10-CM | POA: Diagnosis not present

## 2016-07-31 DIAGNOSIS — M179 Osteoarthritis of knee, unspecified: Secondary | ICD-10-CM | POA: Diagnosis not present

## 2016-08-01 DIAGNOSIS — M179 Osteoarthritis of knee, unspecified: Secondary | ICD-10-CM | POA: Diagnosis not present

## 2016-08-01 DIAGNOSIS — G894 Chronic pain syndrome: Secondary | ICD-10-CM | POA: Diagnosis not present

## 2016-08-02 DIAGNOSIS — M179 Osteoarthritis of knee, unspecified: Secondary | ICD-10-CM | POA: Diagnosis not present

## 2016-08-02 DIAGNOSIS — G894 Chronic pain syndrome: Secondary | ICD-10-CM | POA: Diagnosis not present

## 2016-08-03 DIAGNOSIS — I10 Essential (primary) hypertension: Secondary | ICD-10-CM | POA: Diagnosis not present

## 2016-08-03 DIAGNOSIS — E119 Type 2 diabetes mellitus without complications: Secondary | ICD-10-CM | POA: Diagnosis not present

## 2016-08-03 DIAGNOSIS — E785 Hyperlipidemia, unspecified: Secondary | ICD-10-CM | POA: Diagnosis not present

## 2016-08-03 DIAGNOSIS — G894 Chronic pain syndrome: Secondary | ICD-10-CM | POA: Diagnosis not present

## 2016-08-03 DIAGNOSIS — M179 Osteoarthritis of knee, unspecified: Secondary | ICD-10-CM | POA: Diagnosis not present

## 2016-08-03 DIAGNOSIS — G8929 Other chronic pain: Secondary | ICD-10-CM | POA: Diagnosis not present

## 2016-08-03 DIAGNOSIS — E039 Hypothyroidism, unspecified: Secondary | ICD-10-CM | POA: Diagnosis not present

## 2016-08-04 DIAGNOSIS — M179 Osteoarthritis of knee, unspecified: Secondary | ICD-10-CM | POA: Diagnosis not present

## 2016-08-04 DIAGNOSIS — G894 Chronic pain syndrome: Secondary | ICD-10-CM | POA: Diagnosis not present

## 2016-08-05 DIAGNOSIS — G894 Chronic pain syndrome: Secondary | ICD-10-CM | POA: Diagnosis not present

## 2016-08-05 DIAGNOSIS — M179 Osteoarthritis of knee, unspecified: Secondary | ICD-10-CM | POA: Diagnosis not present

## 2016-08-06 DIAGNOSIS — M179 Osteoarthritis of knee, unspecified: Secondary | ICD-10-CM | POA: Diagnosis not present

## 2016-08-06 DIAGNOSIS — G894 Chronic pain syndrome: Secondary | ICD-10-CM | POA: Diagnosis not present

## 2016-08-07 DIAGNOSIS — G894 Chronic pain syndrome: Secondary | ICD-10-CM | POA: Diagnosis not present

## 2016-08-07 DIAGNOSIS — M179 Osteoarthritis of knee, unspecified: Secondary | ICD-10-CM | POA: Diagnosis not present

## 2016-08-08 DIAGNOSIS — G894 Chronic pain syndrome: Secondary | ICD-10-CM | POA: Diagnosis not present

## 2016-08-08 DIAGNOSIS — M179 Osteoarthritis of knee, unspecified: Secondary | ICD-10-CM | POA: Diagnosis not present

## 2016-08-09 DIAGNOSIS — G894 Chronic pain syndrome: Secondary | ICD-10-CM | POA: Diagnosis not present

## 2016-08-09 DIAGNOSIS — M179 Osteoarthritis of knee, unspecified: Secondary | ICD-10-CM | POA: Diagnosis not present

## 2016-08-10 DIAGNOSIS — G894 Chronic pain syndrome: Secondary | ICD-10-CM | POA: Diagnosis not present

## 2016-08-10 DIAGNOSIS — M179 Osteoarthritis of knee, unspecified: Secondary | ICD-10-CM | POA: Diagnosis not present

## 2016-08-11 DIAGNOSIS — G894 Chronic pain syndrome: Secondary | ICD-10-CM | POA: Diagnosis not present

## 2016-08-11 DIAGNOSIS — M179 Osteoarthritis of knee, unspecified: Secondary | ICD-10-CM | POA: Diagnosis not present

## 2016-08-12 DIAGNOSIS — G894 Chronic pain syndrome: Secondary | ICD-10-CM | POA: Diagnosis not present

## 2016-08-12 DIAGNOSIS — M179 Osteoarthritis of knee, unspecified: Secondary | ICD-10-CM | POA: Diagnosis not present

## 2016-08-13 DIAGNOSIS — M179 Osteoarthritis of knee, unspecified: Secondary | ICD-10-CM | POA: Diagnosis not present

## 2016-08-13 DIAGNOSIS — G894 Chronic pain syndrome: Secondary | ICD-10-CM | POA: Diagnosis not present

## 2016-08-14 DIAGNOSIS — M179 Osteoarthritis of knee, unspecified: Secondary | ICD-10-CM | POA: Diagnosis not present

## 2016-08-14 DIAGNOSIS — G894 Chronic pain syndrome: Secondary | ICD-10-CM | POA: Diagnosis not present

## 2016-08-15 DIAGNOSIS — G894 Chronic pain syndrome: Secondary | ICD-10-CM | POA: Diagnosis not present

## 2016-08-15 DIAGNOSIS — M179 Osteoarthritis of knee, unspecified: Secondary | ICD-10-CM | POA: Diagnosis not present

## 2016-08-16 DIAGNOSIS — M179 Osteoarthritis of knee, unspecified: Secondary | ICD-10-CM | POA: Diagnosis not present

## 2016-08-16 DIAGNOSIS — G894 Chronic pain syndrome: Secondary | ICD-10-CM | POA: Diagnosis not present

## 2016-08-17 DIAGNOSIS — M179 Osteoarthritis of knee, unspecified: Secondary | ICD-10-CM | POA: Diagnosis not present

## 2016-08-17 DIAGNOSIS — G894 Chronic pain syndrome: Secondary | ICD-10-CM | POA: Diagnosis not present

## 2016-08-18 DIAGNOSIS — G894 Chronic pain syndrome: Secondary | ICD-10-CM | POA: Diagnosis not present

## 2016-08-18 DIAGNOSIS — M179 Osteoarthritis of knee, unspecified: Secondary | ICD-10-CM | POA: Diagnosis not present

## 2016-08-19 DIAGNOSIS — M179 Osteoarthritis of knee, unspecified: Secondary | ICD-10-CM | POA: Diagnosis not present

## 2016-08-19 DIAGNOSIS — G894 Chronic pain syndrome: Secondary | ICD-10-CM | POA: Diagnosis not present

## 2016-08-20 DIAGNOSIS — M179 Osteoarthritis of knee, unspecified: Secondary | ICD-10-CM | POA: Diagnosis not present

## 2016-08-20 DIAGNOSIS — G894 Chronic pain syndrome: Secondary | ICD-10-CM | POA: Diagnosis not present

## 2016-08-21 DIAGNOSIS — G894 Chronic pain syndrome: Secondary | ICD-10-CM | POA: Diagnosis not present

## 2016-08-21 DIAGNOSIS — M179 Osteoarthritis of knee, unspecified: Secondary | ICD-10-CM | POA: Diagnosis not present

## 2016-08-22 DIAGNOSIS — G894 Chronic pain syndrome: Secondary | ICD-10-CM | POA: Diagnosis not present

## 2016-08-22 DIAGNOSIS — M179 Osteoarthritis of knee, unspecified: Secondary | ICD-10-CM | POA: Diagnosis not present

## 2016-08-23 DIAGNOSIS — G894 Chronic pain syndrome: Secondary | ICD-10-CM | POA: Diagnosis not present

## 2016-08-23 DIAGNOSIS — M179 Osteoarthritis of knee, unspecified: Secondary | ICD-10-CM | POA: Diagnosis not present

## 2016-08-24 DIAGNOSIS — M179 Osteoarthritis of knee, unspecified: Secondary | ICD-10-CM | POA: Diagnosis not present

## 2016-08-24 DIAGNOSIS — G894 Chronic pain syndrome: Secondary | ICD-10-CM | POA: Diagnosis not present

## 2016-08-25 DIAGNOSIS — M179 Osteoarthritis of knee, unspecified: Secondary | ICD-10-CM | POA: Diagnosis not present

## 2016-08-25 DIAGNOSIS — G894 Chronic pain syndrome: Secondary | ICD-10-CM | POA: Diagnosis not present

## 2016-08-26 DIAGNOSIS — G894 Chronic pain syndrome: Secondary | ICD-10-CM | POA: Diagnosis not present

## 2016-08-26 DIAGNOSIS — M179 Osteoarthritis of knee, unspecified: Secondary | ICD-10-CM | POA: Diagnosis not present

## 2016-08-27 DIAGNOSIS — M179 Osteoarthritis of knee, unspecified: Secondary | ICD-10-CM | POA: Diagnosis not present

## 2016-08-27 DIAGNOSIS — G894 Chronic pain syndrome: Secondary | ICD-10-CM | POA: Diagnosis not present

## 2016-08-28 DIAGNOSIS — M179 Osteoarthritis of knee, unspecified: Secondary | ICD-10-CM | POA: Diagnosis not present

## 2016-08-28 DIAGNOSIS — G894 Chronic pain syndrome: Secondary | ICD-10-CM | POA: Diagnosis not present

## 2016-08-29 DIAGNOSIS — G894 Chronic pain syndrome: Secondary | ICD-10-CM | POA: Diagnosis not present

## 2016-08-29 DIAGNOSIS — M179 Osteoarthritis of knee, unspecified: Secondary | ICD-10-CM | POA: Diagnosis not present

## 2016-08-30 DIAGNOSIS — M179 Osteoarthritis of knee, unspecified: Secondary | ICD-10-CM | POA: Diagnosis not present

## 2016-08-30 DIAGNOSIS — G894 Chronic pain syndrome: Secondary | ICD-10-CM | POA: Diagnosis not present

## 2016-08-31 DIAGNOSIS — G894 Chronic pain syndrome: Secondary | ICD-10-CM | POA: Diagnosis not present

## 2016-08-31 DIAGNOSIS — M179 Osteoarthritis of knee, unspecified: Secondary | ICD-10-CM | POA: Diagnosis not present

## 2016-09-01 DIAGNOSIS — M179 Osteoarthritis of knee, unspecified: Secondary | ICD-10-CM | POA: Diagnosis not present

## 2016-09-01 DIAGNOSIS — G894 Chronic pain syndrome: Secondary | ICD-10-CM | POA: Diagnosis not present

## 2016-09-02 DIAGNOSIS — M179 Osteoarthritis of knee, unspecified: Secondary | ICD-10-CM | POA: Diagnosis not present

## 2016-09-02 DIAGNOSIS — G894 Chronic pain syndrome: Secondary | ICD-10-CM | POA: Diagnosis not present

## 2016-09-03 DIAGNOSIS — M179 Osteoarthritis of knee, unspecified: Secondary | ICD-10-CM | POA: Diagnosis not present

## 2016-09-03 DIAGNOSIS — G894 Chronic pain syndrome: Secondary | ICD-10-CM | POA: Diagnosis not present

## 2016-09-04 DIAGNOSIS — G894 Chronic pain syndrome: Secondary | ICD-10-CM | POA: Diagnosis not present

## 2016-09-04 DIAGNOSIS — M179 Osteoarthritis of knee, unspecified: Secondary | ICD-10-CM | POA: Diagnosis not present

## 2016-09-05 DIAGNOSIS — M179 Osteoarthritis of knee, unspecified: Secondary | ICD-10-CM | POA: Diagnosis not present

## 2016-09-05 DIAGNOSIS — G894 Chronic pain syndrome: Secondary | ICD-10-CM | POA: Diagnosis not present

## 2016-09-06 DIAGNOSIS — G894 Chronic pain syndrome: Secondary | ICD-10-CM | POA: Diagnosis not present

## 2016-09-06 DIAGNOSIS — M179 Osteoarthritis of knee, unspecified: Secondary | ICD-10-CM | POA: Diagnosis not present

## 2016-09-07 DIAGNOSIS — M179 Osteoarthritis of knee, unspecified: Secondary | ICD-10-CM | POA: Diagnosis not present

## 2016-09-07 DIAGNOSIS — G894 Chronic pain syndrome: Secondary | ICD-10-CM | POA: Diagnosis not present

## 2016-09-08 DIAGNOSIS — M5442 Lumbago with sciatica, left side: Secondary | ICD-10-CM | POA: Diagnosis not present

## 2016-09-08 DIAGNOSIS — G894 Chronic pain syndrome: Secondary | ICD-10-CM | POA: Diagnosis not present

## 2016-09-08 DIAGNOSIS — M25561 Pain in right knee: Secondary | ICD-10-CM | POA: Diagnosis not present

## 2016-09-08 DIAGNOSIS — M179 Osteoarthritis of knee, unspecified: Secondary | ICD-10-CM | POA: Diagnosis not present

## 2016-09-09 DIAGNOSIS — G894 Chronic pain syndrome: Secondary | ICD-10-CM | POA: Diagnosis not present

## 2016-09-09 DIAGNOSIS — M179 Osteoarthritis of knee, unspecified: Secondary | ICD-10-CM | POA: Diagnosis not present

## 2016-09-10 ENCOUNTER — Other Ambulatory Visit (INDEPENDENT_AMBULATORY_CARE_PROVIDER_SITE_OTHER): Payer: Self-pay | Admitting: Orthopedic Surgery

## 2016-09-10 DIAGNOSIS — M179 Osteoarthritis of knee, unspecified: Secondary | ICD-10-CM | POA: Diagnosis not present

## 2016-09-10 DIAGNOSIS — G894 Chronic pain syndrome: Secondary | ICD-10-CM | POA: Diagnosis not present

## 2016-09-20 DIAGNOSIS — G894 Chronic pain syndrome: Secondary | ICD-10-CM | POA: Diagnosis not present

## 2016-09-20 DIAGNOSIS — M179 Osteoarthritis of knee, unspecified: Secondary | ICD-10-CM | POA: Diagnosis not present

## 2016-09-21 ENCOUNTER — Encounter (HOSPITAL_COMMUNITY): Payer: Self-pay

## 2016-09-21 ENCOUNTER — Emergency Department (HOSPITAL_COMMUNITY)
Admission: EM | Admit: 2016-09-21 | Discharge: 2016-09-21 | Disposition: A | Discharge status: Home / Self Care | Payer: Medicare HMO | Attending: Emergency Medicine | Admitting: Emergency Medicine

## 2016-09-21 DIAGNOSIS — M79604 Pain in right leg: Secondary | ICD-10-CM | POA: Insufficient documentation

## 2016-09-21 DIAGNOSIS — M79661 Pain in right lower leg: Secondary | ICD-10-CM | POA: Diagnosis not present

## 2016-09-21 DIAGNOSIS — J45909 Unspecified asthma, uncomplicated: Secondary | ICD-10-CM | POA: Diagnosis not present

## 2016-09-21 DIAGNOSIS — G894 Chronic pain syndrome: Secondary | ICD-10-CM | POA: Diagnosis not present

## 2016-09-21 DIAGNOSIS — G8929 Other chronic pain: Secondary | ICD-10-CM | POA: Diagnosis not present

## 2016-09-21 DIAGNOSIS — J45901 Unspecified asthma with (acute) exacerbation: Secondary | ICD-10-CM | POA: Diagnosis not present

## 2016-09-21 DIAGNOSIS — F17210 Nicotine dependence, cigarettes, uncomplicated: Secondary | ICD-10-CM | POA: Diagnosis not present

## 2016-09-21 DIAGNOSIS — E039 Hypothyroidism, unspecified: Secondary | ICD-10-CM | POA: Diagnosis not present

## 2016-09-21 DIAGNOSIS — R062 Wheezing: Secondary | ICD-10-CM | POA: Diagnosis present

## 2016-09-21 DIAGNOSIS — I10 Essential (primary) hypertension: Secondary | ICD-10-CM | POA: Insufficient documentation

## 2016-09-21 DIAGNOSIS — M179 Osteoarthritis of knee, unspecified: Secondary | ICD-10-CM | POA: Diagnosis not present

## 2016-09-21 DIAGNOSIS — E1140 Type 2 diabetes mellitus with diabetic neuropathy, unspecified: Secondary | ICD-10-CM | POA: Insufficient documentation

## 2016-09-21 DIAGNOSIS — R069 Unspecified abnormalities of breathing: Secondary | ICD-10-CM | POA: Diagnosis not present

## 2016-09-21 MED ORDER — HYDROMORPHONE HCL 1 MG/ML IJ SOLN
1.0000 mg | Freq: Once | INTRAMUSCULAR | Status: AC
  Administered 2016-09-21: 1 mg via INTRAMUSCULAR
  Filled 2016-09-21: qty 1

## 2016-09-21 MED ORDER — KETOROLAC TROMETHAMINE 15 MG/ML IJ SOLN
30.0000 mg | Freq: Once | INTRAMUSCULAR | Status: AC
  Administered 2016-09-21: 30 mg via INTRAMUSCULAR
  Filled 2016-09-21: qty 2

## 2016-09-21 MED ORDER — LORAZEPAM 2 MG/ML IJ SOLN
1.0000 mg | Freq: Once | INTRAMUSCULAR | Status: AC
  Administered 2016-09-21: 1 mg via INTRAMUSCULAR
  Filled 2016-09-21: qty 1

## 2016-09-21 MED ORDER — PREDNISONE 20 MG PO TABS: 40.0000 mg | ORAL_TABLET | Freq: Every day | ORAL | 0 refills | Status: DC

## 2016-09-21 NOTE — ED Notes (Signed)
Pt calling for ride due to pain medication

## 2016-09-21 NOTE — ED Triage Notes (Signed)
Pt presents via [facility] for evaluation of chronic persistent R knee and leg pain. Pt reports takes multiple pain meds with no improvement. Pt also noted to have hx of asthma. Wheezing noted bilaterally per EMS. Given 5 mg albuterol and 0.5 mg atrovent PTA. Pt O2 sats 98% on RA. Speaking in full sentences in triage, no distress.

## 2016-09-21 NOTE — ED Provider Notes (Signed)
[facility] DEPT Provider Note   CSN: [id number] Arrival date & time: 09/21/16  1318  By signing my name below, I, Caroline Williams, attest that this documentation has been prepared under the direction and in the presence of [name], MD. Electronically Signed: Sonum Williams, Education administrator. 09/21/16. 1:41 PM.  History   Chief Complaint Chief Complaint  Patient presents with  . Wheezing  . Knee Pain    The history is provided by the patient. No language interpreter was used.    HPI Comments: Caroline Williams is a 56 y.o. female brought in by ambulance, who presents to the Emergency Department complaining of chronic right knee pain that worsened 1 week ago. She reports a history of 2 knee replacements to the affected knee. She is currently prescribed gabapentin, oxycodone, and morphine which sometimes provides relief. She notes having a fall in August 2017 and reports persistent lower back and worsened right knee pain since then. She is followed by [facility].   She also complains of an asthma exacerbation which she attributes to her anxiety. She was given a breathing treatment by EMS which has provided her relief.   Past Medical History:  Diagnosis Date  . Anxiety   . Arthritis   . Asthma   . Bipolar 1 disorder ([place])   . Breast discharge   . Breast lump   . Breast pain   . Depression   . Diabetes mellitus   . Fever   . GERD (gastroesophageal reflux disease)   . Graves disease   . Graves disease   . History of knee replacement, total   . Hypertension   . Hypothyroidism   . Pneumonia    2015,2014  . Sleep apnea   . Thyroid disease     Patient Active Problem List   Diagnosis Date Noted  . Chest pain 01/26/2016  . Sepsis due to Escherichia coli ([place])   . E coli bacteremia   . E. coli UTI   . Hallucination, drug-induced ([place]) 03/12/2015  . Leukocytosis 03/12/2015  . Hypokalemia 03/12/2015  . Hypothyroidism 03/12/2015  . UTI (urinary tract infection)  03/12/2015  . Sepsis due to Gram negative bacteria ([place])   . Gram-negative bacteremia 03/11/2015  . Lap gastric bypass June 2016 11/25/2014  . Diabetes mellitus with neuropathy ([place]) 06/20/2013  . Chronic pain of right knee 06/20/2013  . Morbid obesity ([place]) 07/05/2012    Past Surgical History:  Procedure Laterality Date  . ABDOMINAL HYSTERECTOMY     partial  . APPENDECTOMY    . BREAST LUMPECTOMY     right  . CARDIAC CATHETERIZATION     no significant CAD, nl LV function by 11/08/06 cath  . CESAREAN SECTION    . GASTRIC ROUX-EN-Y N/A 11/25/2014   Procedure: LAPAROSCOPIC ROUX-EN-Y GASTRIC BYPASS WITH UPPER ENDOSCOPY;  Surgeon: Johnathan Hausen, MD;  Location: [facility];  Service: General;  Laterality: N/A;  . HERNIA REPAIR    . JOINT REPLACEMENT    . KNEE SURGERY     right  . [place]  . [place]  . TOTAL KNEE REVISION  06/21/2012   Procedure: TOTAL KNEE REVISION;  Surgeon: Newt Minion, MD;  Location: [facility];  Service: Orthopedics;  Laterality: Right;  Revision Right Total Knee Arthroplasty    OB History    No data available       Home Medications    Prior to Admission medications   Medication Sig Start  Date End Date Taking? Authorizing Provider  ADVAIR DISKUS 500-50 MCG/DOSE AEPB Inhale 1 puff into the lungs 2 (two) times daily as needed (shortness of breath, wheezing).  12/05/13  Yes Historical Provider, MD  albuterol (PROVENTIL HFA;VENTOLIN HFA) 108 (90 BASE) MCG/ACT inhaler Inhale 2 puffs into the lungs every 6 (six) hours as needed for wheezing or shortness of breath. Shortness of breath   Yes Historical Provider, MD  ALPRAZolam (XANAX) 1 MG tablet Take 1 mg by mouth 4 (four) times daily.  07/02/14  Yes Historical Provider, MD  amLODipine (NORVASC) 5 MG tablet Take 5 mg by mouth daily. 06/15/16  Yes Historical Provider, MD  amphetamine-dextroamphetamine (ADDERALL XR) 30 MG 24 hr capsule Take 30 mg by mouth daily. 06/25/16  Yes  Historical Provider, MD  citalopram (CELEXA) 40 MG tablet Take 40 mg by mouth at bedtime. 06/24/16  Yes Historical Provider, MD  cyclobenzaprine (FLEXERIL) 10 MG tablet Take 1 tablet (10 mg total) by mouth 2 (two) times daily as needed for muscle spasms (back pain). 12/21/15  Yes Rhetta Mura Schorr, NP  fluticasone (FLONASE) 50 MCG/ACT nasal spray Place 2 sprays into the nose daily as needed (congestion).   Yes Historical Provider, MD  gabapentin (NEURONTIN) 600 MG tablet Take 600 mg by mouth 2 (two) times daily.   Yes Historical Provider, MD  levocetirizine (XYZAL) 5 MG tablet Take 5 mg by mouth daily as needed for allergies.  06/29/14  Yes Historical Provider, MD  levothyroxine (SYNTHROID, LEVOTHROID) 112 MCG tablet Take 112 mcg by mouth daily. 08/09/16  Yes Historical Provider, MD  morphine (MS CONTIN) 15 MG 12 hr tablet Take 1 tablet (15 mg total) by mouth every 12 (twelve) hours. 11/14/14  Yes Bayard Hugger, NP  Multiple Vitamin (MULTIVITAMIN) tablet Take 1 tablet by mouth every morning.    Yes Historical Provider, MD  nabumetone (RELAFEN) 750 MG tablet TAKE 1 TABLET BY MOUTH TWICE DAILY AS NEEDED FOR INFLAMMATION/PAIN 09/14/16  Yes Newt Minion, MD  Oxycodone HCl 10 MG TABS Take 1 tablet (10 mg total) by mouth every 6 (six) hours as needed (for pain). 11/14/14  Yes Bayard Hugger, NP  REXULTI 2 MG TABS Take 2 mg by mouth at bedtime.  06/18/14  Yes Historical Provider, MD  simvastatin (ZOCOR) 40 MG tablet Take 40 mg by mouth every evening. 06/17/16  Yes Historical Provider, MD  valsartan (DIOVAN) 160 MG tablet Take 160 mg by mouth daily. 06/17/16  Yes Historical Provider, MD  VOLTAREN 1 % GEL Apply 1 application topically 4 (four) times daily as needed (pain).  01/23/15  Yes Historical Provider, MD  zolpidem (AMBIEN) 10 MG tablet Take 10 mg by mouth at bedtime.  09/07/14  Yes Historical Provider, MD  amphetamine-dextroamphetamine (ADDERALL) 10 MG tablet Take 10 mg by mouth 2 (two) times daily. 06/25/16    Historical Provider, MD  EPINEPHrine 0.3 mg/0.3 mL IJ SOAJ injection Inject 0.3 mg as directed as needed (severe allergic reaction).  01/11/14   Historical Provider, MD  hydrochlorothiazide (MICROZIDE) 12.5 MG capsule Take 1 capsule (12.5 mg total) by mouth daily. 01/26/16 07/19/16  Belva Crome, MD  lidocaine (LIDODERM) 5 % Place 1 patch onto the skin daily. Remove & Discard patch within 12 hours or as directed by MD Patient not taking: Reported on 07/19/2016 09/07/15   Melony Overly, MD  nitroGLYCERIN (NITROSTAT) 0.4 MG SL tablet DISSOLVE 1 TABLET UNDER THE TONGUE EVERY 5 MINUTES AS NEEDED Patient taking differently: DISSOLVE 1 TABLET  UNDER THE TONGUE EVERY 5 MINUTES AS NEEDED FOR CHEST PAIN 01/27/16   Belva Crome, MD    Family History Family History  Problem Relation Age of Onset  . Cancer Father     colon  . Stroke Father   . Heart disease Father   . Diabetes Father   . Hypertension Father   . Depression Sister   . Hypertension Mother     Social History Social History  Substance Use Topics  . Smoking status: Current Every Day Smoker    Packs/day: 0.50    Years: 4.00    Types: Cigarettes    Last attempt to quit: 09/13/2014  . Smokeless tobacco: Never Used  . Alcohol use No     Allergies   Aspirin; Sulfa antibiotics; and Other   Review of Systems Review of Systems A complete review of systems was obtained and all systems are negative except as noted in the HPI and PMH.    Physical Exam Updated Vital Signs BP 131/79 (BP Location: Left Arm)   Pulse 65   Temp 97.9 F (36.6 C) (Oral)   Resp 18   SpO2 99%   Physical Exam  Constitutional: She is oriented to person, place, and time. She appears well-developed and well-nourished. No distress.  HENT:  Head: Normocephalic and atraumatic.  Eyes: EOM are normal.  Neck: Normal range of motion.  Cardiovascular: Normal rate, regular rhythm, normal heart sounds and intact distal pulses.   Palpable DP pulses   Pulmonary/Chest:  Effort normal. She has wheezes.  Speaks in complete sentences. Bilateral expiratory wheezes.   Abdominal: Soft. She exhibits no distension. There is no tenderness.  Musculoskeletal: Normal range of motion.  R knee symmetric as compared to L. No effusion. No point tenderness. Can actively range although with increased pain.   Neurological: She is alert and oriented to person, place, and time.  Strong and equal plantar flexion bilaterally   Skin: Skin is warm and dry.  Psychiatric: She has a normal mood and affect. Judgment normal.  Nursing note and vitals reviewed.    ED Treatments / Results  DIAGNOSTIC STUDIES: Oxygen Saturation is 98% on RA, normal by my interpretation.    COORDINATION OF CARE: 1:22 PM Discussed treatment plan with pt at bedside and pt agreed to plan.   Labs (all labs ordered are listed, but only abnormal results are displayed) Labs Reviewed - No data to display  EKG  EKG Interpretation  Date/Time:  Tuesday September 21 2016 13:33:17 EDT Ventricular Rate:  66 PR Interval:    QRS Duration: 100 QT Interval:  412 QTC Calculation: 432 R Axis:   0 Text Interpretation:  Sinus rhythm Confirmed by Wilson Singer  MD, Annie Main ([id number]) on 09/21/2016 2:49:00 PM       Radiology No results found.  Procedures Procedures (including critical care time)  Medications Ordered in ED Medications  HYDROmorphone (DILAUDID) injection 1 mg (1 mg Intramuscular Given 09/21/16 1350)  LORazepam (ATIVAN) injection 1 mg (1 mg Intramuscular Given 09/21/16 1349)  ketorolac (TORADOL) 15 MG/ML injection 30 mg (30 mg Intramuscular Given 09/21/16 1349)     Initial Impression / Assessment and Plan / ED Course  I have reviewed the triage vital signs and the nursing notes.  Pertinent labs & imaging results that were available during my care of the patient were reviewed by me and considered in my medical decision making (see chart for details).     Final Clinical Impressions(s) / ED Diagnoses    Final  diagnoses:  Chronic pain of right lower extremity  Mild asthma with exacerbation, unspecified whether persistent    New Prescriptions New Prescriptions   No medications on file   I personally preformed the services scribed in my presence. The recorded information has been reviewed is accurate. [name], MD.    [name], MD 09/30/16 [phone]

## 2016-09-22 DIAGNOSIS — M1712 Unilateral primary osteoarthritis, left knee: Secondary | ICD-10-CM | POA: Diagnosis not present

## 2016-09-22 DIAGNOSIS — G894 Chronic pain syndrome: Secondary | ICD-10-CM | POA: Diagnosis not present

## 2016-09-22 DIAGNOSIS — Z96651 Presence of right artificial knee joint: Secondary | ICD-10-CM | POA: Diagnosis not present

## 2016-09-22 DIAGNOSIS — M25562 Pain in left knee: Secondary | ICD-10-CM | POA: Diagnosis not present

## 2016-09-22 DIAGNOSIS — M179 Osteoarthritis of knee, unspecified: Secondary | ICD-10-CM | POA: Diagnosis not present

## 2016-09-22 DIAGNOSIS — G8929 Other chronic pain: Secondary | ICD-10-CM | POA: Diagnosis not present

## 2016-09-22 DIAGNOSIS — M7051 Other bursitis of knee, right knee: Secondary | ICD-10-CM | POA: Diagnosis not present

## 2016-09-23 DIAGNOSIS — M179 Osteoarthritis of knee, unspecified: Secondary | ICD-10-CM | POA: Diagnosis not present

## 2016-09-23 DIAGNOSIS — G894 Chronic pain syndrome: Secondary | ICD-10-CM | POA: Diagnosis not present

## 2016-09-24 DIAGNOSIS — G894 Chronic pain syndrome: Secondary | ICD-10-CM | POA: Diagnosis not present

## 2016-09-24 DIAGNOSIS — M179 Osteoarthritis of knee, unspecified: Secondary | ICD-10-CM | POA: Diagnosis not present

## 2016-09-25 DIAGNOSIS — M179 Osteoarthritis of knee, unspecified: Secondary | ICD-10-CM | POA: Diagnosis not present

## 2016-09-25 DIAGNOSIS — G894 Chronic pain syndrome: Secondary | ICD-10-CM | POA: Diagnosis not present

## 2016-09-26 DIAGNOSIS — G894 Chronic pain syndrome: Secondary | ICD-10-CM | POA: Diagnosis not present

## 2016-09-26 DIAGNOSIS — M179 Osteoarthritis of knee, unspecified: Secondary | ICD-10-CM | POA: Diagnosis not present

## 2016-09-27 DIAGNOSIS — G894 Chronic pain syndrome: Secondary | ICD-10-CM | POA: Diagnosis not present

## 2016-09-27 DIAGNOSIS — M179 Osteoarthritis of knee, unspecified: Secondary | ICD-10-CM | POA: Diagnosis not present

## 2016-09-28 DIAGNOSIS — G894 Chronic pain syndrome: Secondary | ICD-10-CM | POA: Diagnosis not present

## 2016-09-28 DIAGNOSIS — M179 Osteoarthritis of knee, unspecified: Secondary | ICD-10-CM | POA: Diagnosis not present

## 2016-09-29 DIAGNOSIS — G894 Chronic pain syndrome: Secondary | ICD-10-CM | POA: Diagnosis not present

## 2016-09-29 DIAGNOSIS — M179 Osteoarthritis of knee, unspecified: Secondary | ICD-10-CM | POA: Diagnosis not present

## 2016-09-30 DIAGNOSIS — M179 Osteoarthritis of knee, unspecified: Secondary | ICD-10-CM | POA: Diagnosis not present

## 2016-09-30 DIAGNOSIS — G894 Chronic pain syndrome: Secondary | ICD-10-CM | POA: Diagnosis not present

## 2016-10-01 DIAGNOSIS — G894 Chronic pain syndrome: Secondary | ICD-10-CM | POA: Diagnosis not present

## 2016-10-01 DIAGNOSIS — M179 Osteoarthritis of knee, unspecified: Secondary | ICD-10-CM | POA: Diagnosis not present

## 2016-10-02 DIAGNOSIS — M179 Osteoarthritis of knee, unspecified: Secondary | ICD-10-CM | POA: Diagnosis not present

## 2016-10-02 DIAGNOSIS — G894 Chronic pain syndrome: Secondary | ICD-10-CM | POA: Diagnosis not present

## 2016-10-03 DIAGNOSIS — G894 Chronic pain syndrome: Secondary | ICD-10-CM | POA: Diagnosis not present

## 2016-10-03 DIAGNOSIS — M179 Osteoarthritis of knee, unspecified: Secondary | ICD-10-CM | POA: Diagnosis not present

## 2016-10-04 ENCOUNTER — Telehealth: Payer: Self-pay | Admitting: Interventional Cardiology

## 2016-10-04 DIAGNOSIS — M179 Osteoarthritis of knee, unspecified: Secondary | ICD-10-CM | POA: Diagnosis not present

## 2016-10-04 DIAGNOSIS — G894 Chronic pain syndrome: Secondary | ICD-10-CM | POA: Diagnosis not present

## 2016-10-04 NOTE — Telephone Encounter (Signed)
Spoke with pt and informed her that order for Lexi is still in the system and to just call back and schedule. Pt verbalized understanding and was in agreement with this plan.

## 2016-10-04 NOTE — Telephone Encounter (Signed)
New message    Pt is calling about stress test appt she had scheduled in November 2017. She is calling to reschedule it. Does she need a new order for this?

## 2016-10-05 DIAGNOSIS — M179 Osteoarthritis of knee, unspecified: Secondary | ICD-10-CM | POA: Diagnosis not present

## 2016-10-05 DIAGNOSIS — G894 Chronic pain syndrome: Secondary | ICD-10-CM | POA: Diagnosis not present

## 2016-10-06 DIAGNOSIS — G894 Chronic pain syndrome: Secondary | ICD-10-CM | POA: Diagnosis not present

## 2016-10-06 DIAGNOSIS — M179 Osteoarthritis of knee, unspecified: Secondary | ICD-10-CM | POA: Diagnosis not present

## 2016-10-07 DIAGNOSIS — M179 Osteoarthritis of knee, unspecified: Secondary | ICD-10-CM | POA: Diagnosis not present

## 2016-10-07 DIAGNOSIS — G894 Chronic pain syndrome: Secondary | ICD-10-CM | POA: Diagnosis not present

## 2016-10-08 DIAGNOSIS — M179 Osteoarthritis of knee, unspecified: Secondary | ICD-10-CM | POA: Diagnosis not present

## 2016-10-08 DIAGNOSIS — G894 Chronic pain syndrome: Secondary | ICD-10-CM | POA: Diagnosis not present

## 2016-10-09 DIAGNOSIS — M179 Osteoarthritis of knee, unspecified: Secondary | ICD-10-CM | POA: Diagnosis not present

## 2016-10-09 DIAGNOSIS — G894 Chronic pain syndrome: Secondary | ICD-10-CM | POA: Diagnosis not present

## 2016-10-10 DIAGNOSIS — G894 Chronic pain syndrome: Secondary | ICD-10-CM | POA: Diagnosis not present

## 2016-10-10 DIAGNOSIS — M179 Osteoarthritis of knee, unspecified: Secondary | ICD-10-CM | POA: Diagnosis not present

## 2016-10-11 DIAGNOSIS — M179 Osteoarthritis of knee, unspecified: Secondary | ICD-10-CM | POA: Diagnosis not present

## 2016-10-11 DIAGNOSIS — G894 Chronic pain syndrome: Secondary | ICD-10-CM | POA: Diagnosis not present

## 2016-10-12 DIAGNOSIS — M179 Osteoarthritis of knee, unspecified: Secondary | ICD-10-CM | POA: Diagnosis not present

## 2016-10-12 DIAGNOSIS — G894 Chronic pain syndrome: Secondary | ICD-10-CM | POA: Diagnosis not present

## 2016-10-13 DIAGNOSIS — G894 Chronic pain syndrome: Secondary | ICD-10-CM | POA: Diagnosis not present

## 2016-10-13 DIAGNOSIS — M179 Osteoarthritis of knee, unspecified: Secondary | ICD-10-CM | POA: Diagnosis not present

## 2016-10-14 DIAGNOSIS — G894 Chronic pain syndrome: Secondary | ICD-10-CM | POA: Diagnosis not present

## 2016-10-14 DIAGNOSIS — M179 Osteoarthritis of knee, unspecified: Secondary | ICD-10-CM | POA: Diagnosis not present

## 2016-10-15 DIAGNOSIS — G894 Chronic pain syndrome: Secondary | ICD-10-CM | POA: Diagnosis not present

## 2016-10-15 DIAGNOSIS — M179 Osteoarthritis of knee, unspecified: Secondary | ICD-10-CM | POA: Diagnosis not present

## 2016-10-16 DIAGNOSIS — M179 Osteoarthritis of knee, unspecified: Secondary | ICD-10-CM | POA: Diagnosis not present

## 2016-10-16 DIAGNOSIS — G894 Chronic pain syndrome: Secondary | ICD-10-CM | POA: Diagnosis not present

## 2016-10-17 DIAGNOSIS — G894 Chronic pain syndrome: Secondary | ICD-10-CM | POA: Diagnosis not present

## 2016-10-17 DIAGNOSIS — M179 Osteoarthritis of knee, unspecified: Secondary | ICD-10-CM | POA: Diagnosis not present

## 2016-10-18 DIAGNOSIS — M179 Osteoarthritis of knee, unspecified: Secondary | ICD-10-CM | POA: Diagnosis not present

## 2016-10-18 DIAGNOSIS — G894 Chronic pain syndrome: Secondary | ICD-10-CM | POA: Diagnosis not present

## 2016-10-19 ENCOUNTER — Other Ambulatory Visit: Payer: Self-pay | Admitting: Interventional Cardiology

## 2016-10-19 DIAGNOSIS — G894 Chronic pain syndrome: Secondary | ICD-10-CM | POA: Diagnosis not present

## 2016-10-19 DIAGNOSIS — R079 Chest pain, unspecified: Secondary | ICD-10-CM

## 2016-10-19 DIAGNOSIS — M179 Osteoarthritis of knee, unspecified: Secondary | ICD-10-CM | POA: Diagnosis not present

## 2016-10-20 DIAGNOSIS — M179 Osteoarthritis of knee, unspecified: Secondary | ICD-10-CM | POA: Diagnosis not present

## 2016-10-20 DIAGNOSIS — G894 Chronic pain syndrome: Secondary | ICD-10-CM | POA: Diagnosis not present

## 2016-10-21 DIAGNOSIS — M179 Osteoarthritis of knee, unspecified: Secondary | ICD-10-CM | POA: Diagnosis not present

## 2016-10-21 DIAGNOSIS — G894 Chronic pain syndrome: Secondary | ICD-10-CM | POA: Diagnosis not present

## 2016-10-22 DIAGNOSIS — M179 Osteoarthritis of knee, unspecified: Secondary | ICD-10-CM | POA: Diagnosis not present

## 2016-10-22 DIAGNOSIS — G894 Chronic pain syndrome: Secondary | ICD-10-CM | POA: Diagnosis not present

## 2016-10-23 DIAGNOSIS — M179 Osteoarthritis of knee, unspecified: Secondary | ICD-10-CM | POA: Diagnosis not present

## 2016-10-23 DIAGNOSIS — G894 Chronic pain syndrome: Secondary | ICD-10-CM | POA: Diagnosis not present

## 2016-10-24 DIAGNOSIS — G894 Chronic pain syndrome: Secondary | ICD-10-CM | POA: Diagnosis not present

## 2016-10-24 DIAGNOSIS — M179 Osteoarthritis of knee, unspecified: Secondary | ICD-10-CM | POA: Diagnosis not present

## 2016-10-25 DIAGNOSIS — M179 Osteoarthritis of knee, unspecified: Secondary | ICD-10-CM | POA: Diagnosis not present

## 2016-10-25 DIAGNOSIS — G894 Chronic pain syndrome: Secondary | ICD-10-CM | POA: Diagnosis not present

## 2016-10-26 DIAGNOSIS — F423 Hoarding disorder: Secondary | ICD-10-CM | POA: Diagnosis not present

## 2016-10-26 DIAGNOSIS — F900 Attention-deficit hyperactivity disorder, predominantly inattentive type: Secondary | ICD-10-CM | POA: Diagnosis not present

## 2016-10-26 DIAGNOSIS — G894 Chronic pain syndrome: Secondary | ICD-10-CM | POA: Diagnosis not present

## 2016-10-26 DIAGNOSIS — F250 Schizoaffective disorder, bipolar type: Secondary | ICD-10-CM | POA: Diagnosis not present

## 2016-10-26 DIAGNOSIS — M179 Osteoarthritis of knee, unspecified: Secondary | ICD-10-CM | POA: Diagnosis not present

## 2016-10-27 DIAGNOSIS — G894 Chronic pain syndrome: Secondary | ICD-10-CM | POA: Diagnosis not present

## 2016-10-27 DIAGNOSIS — M179 Osteoarthritis of knee, unspecified: Secondary | ICD-10-CM | POA: Diagnosis not present

## 2016-10-28 DIAGNOSIS — M179 Osteoarthritis of knee, unspecified: Secondary | ICD-10-CM | POA: Diagnosis not present

## 2016-10-28 DIAGNOSIS — G894 Chronic pain syndrome: Secondary | ICD-10-CM | POA: Diagnosis not present

## 2016-10-29 DIAGNOSIS — M179 Osteoarthritis of knee, unspecified: Secondary | ICD-10-CM | POA: Diagnosis not present

## 2016-10-29 DIAGNOSIS — G894 Chronic pain syndrome: Secondary | ICD-10-CM | POA: Diagnosis not present

## 2016-10-30 DIAGNOSIS — M179 Osteoarthritis of knee, unspecified: Secondary | ICD-10-CM | POA: Diagnosis not present

## 2016-10-30 DIAGNOSIS — G894 Chronic pain syndrome: Secondary | ICD-10-CM | POA: Diagnosis not present

## 2016-10-31 DIAGNOSIS — M179 Osteoarthritis of knee, unspecified: Secondary | ICD-10-CM | POA: Diagnosis not present

## 2016-10-31 DIAGNOSIS — G894 Chronic pain syndrome: Secondary | ICD-10-CM | POA: Diagnosis not present

## 2016-11-01 DIAGNOSIS — M179 Osteoarthritis of knee, unspecified: Secondary | ICD-10-CM | POA: Diagnosis not present

## 2016-11-01 DIAGNOSIS — G894 Chronic pain syndrome: Secondary | ICD-10-CM | POA: Diagnosis not present

## 2016-11-02 DIAGNOSIS — M179 Osteoarthritis of knee, unspecified: Secondary | ICD-10-CM | POA: Diagnosis not present

## 2016-11-02 DIAGNOSIS — G894 Chronic pain syndrome: Secondary | ICD-10-CM | POA: Diagnosis not present

## 2016-11-03 DIAGNOSIS — M179 Osteoarthritis of knee, unspecified: Secondary | ICD-10-CM | POA: Diagnosis not present

## 2016-11-03 DIAGNOSIS — G894 Chronic pain syndrome: Secondary | ICD-10-CM | POA: Diagnosis not present

## 2016-11-04 DIAGNOSIS — G894 Chronic pain syndrome: Secondary | ICD-10-CM | POA: Diagnosis not present

## 2016-11-04 DIAGNOSIS — M179 Osteoarthritis of knee, unspecified: Secondary | ICD-10-CM | POA: Diagnosis not present

## 2016-11-05 DIAGNOSIS — E782 Mixed hyperlipidemia: Secondary | ICD-10-CM | POA: Diagnosis not present

## 2016-11-05 DIAGNOSIS — G8929 Other chronic pain: Secondary | ICD-10-CM | POA: Diagnosis not present

## 2016-11-05 DIAGNOSIS — M179 Osteoarthritis of knee, unspecified: Secondary | ICD-10-CM | POA: Diagnosis not present

## 2016-11-05 DIAGNOSIS — E039 Hypothyroidism, unspecified: Secondary | ICD-10-CM | POA: Diagnosis not present

## 2016-11-05 DIAGNOSIS — E119 Type 2 diabetes mellitus without complications: Secondary | ICD-10-CM | POA: Diagnosis not present

## 2016-11-05 DIAGNOSIS — R399 Unspecified symptoms and signs involving the genitourinary system: Secondary | ICD-10-CM | POA: Diagnosis not present

## 2016-11-05 DIAGNOSIS — Z79899 Other long term (current) drug therapy: Secondary | ICD-10-CM | POA: Diagnosis not present

## 2016-11-05 DIAGNOSIS — G894 Chronic pain syndrome: Secondary | ICD-10-CM | POA: Diagnosis not present

## 2016-11-05 DIAGNOSIS — I10 Essential (primary) hypertension: Secondary | ICD-10-CM | POA: Diagnosis not present

## 2016-11-06 DIAGNOSIS — M6258 Muscle wasting and atrophy, not elsewhere classified, other site: Secondary | ICD-10-CM | POA: Diagnosis not present

## 2016-11-06 DIAGNOSIS — M179 Osteoarthritis of knee, unspecified: Secondary | ICD-10-CM | POA: Diagnosis not present

## 2016-11-06 DIAGNOSIS — G894 Chronic pain syndrome: Secondary | ICD-10-CM | POA: Diagnosis not present

## 2016-11-07 DIAGNOSIS — M179 Osteoarthritis of knee, unspecified: Secondary | ICD-10-CM | POA: Diagnosis not present

## 2016-11-07 DIAGNOSIS — G894 Chronic pain syndrome: Secondary | ICD-10-CM | POA: Diagnosis not present

## 2016-11-08 DIAGNOSIS — G894 Chronic pain syndrome: Secondary | ICD-10-CM | POA: Diagnosis not present

## 2016-11-08 DIAGNOSIS — M179 Osteoarthritis of knee, unspecified: Secondary | ICD-10-CM | POA: Diagnosis not present

## 2016-11-09 DIAGNOSIS — G894 Chronic pain syndrome: Secondary | ICD-10-CM | POA: Diagnosis not present

## 2016-11-09 DIAGNOSIS — M179 Osteoarthritis of knee, unspecified: Secondary | ICD-10-CM | POA: Diagnosis not present

## 2016-11-10 DIAGNOSIS — M179 Osteoarthritis of knee, unspecified: Secondary | ICD-10-CM | POA: Diagnosis not present

## 2016-11-10 DIAGNOSIS — G894 Chronic pain syndrome: Secondary | ICD-10-CM | POA: Diagnosis not present

## 2016-11-11 ENCOUNTER — Other Ambulatory Visit: Payer: Self-pay | Admitting: Family Medicine

## 2016-11-11 DIAGNOSIS — Z1231 Encounter for screening mammogram for malignant neoplasm of breast: Secondary | ICD-10-CM

## 2016-11-11 DIAGNOSIS — G894 Chronic pain syndrome: Secondary | ICD-10-CM | POA: Diagnosis not present

## 2016-11-11 DIAGNOSIS — M179 Osteoarthritis of knee, unspecified: Secondary | ICD-10-CM | POA: Diagnosis not present

## 2016-11-12 DIAGNOSIS — G894 Chronic pain syndrome: Secondary | ICD-10-CM | POA: Diagnosis not present

## 2016-11-12 DIAGNOSIS — M179 Osteoarthritis of knee, unspecified: Secondary | ICD-10-CM | POA: Diagnosis not present

## 2016-11-13 DIAGNOSIS — G894 Chronic pain syndrome: Secondary | ICD-10-CM | POA: Diagnosis not present

## 2016-11-13 DIAGNOSIS — M179 Osteoarthritis of knee, unspecified: Secondary | ICD-10-CM | POA: Diagnosis not present

## 2016-11-14 DIAGNOSIS — M179 Osteoarthritis of knee, unspecified: Secondary | ICD-10-CM | POA: Diagnosis not present

## 2016-11-14 DIAGNOSIS — G894 Chronic pain syndrome: Secondary | ICD-10-CM | POA: Diagnosis not present

## 2016-11-15 DIAGNOSIS — G894 Chronic pain syndrome: Secondary | ICD-10-CM | POA: Diagnosis not present

## 2016-11-15 DIAGNOSIS — M179 Osteoarthritis of knee, unspecified: Secondary | ICD-10-CM | POA: Diagnosis not present

## 2016-11-16 DIAGNOSIS — G894 Chronic pain syndrome: Secondary | ICD-10-CM | POA: Diagnosis not present

## 2016-11-16 DIAGNOSIS — M179 Osteoarthritis of knee, unspecified: Secondary | ICD-10-CM | POA: Diagnosis not present

## 2016-11-17 DIAGNOSIS — M179 Osteoarthritis of knee, unspecified: Secondary | ICD-10-CM | POA: Diagnosis not present

## 2016-11-17 DIAGNOSIS — G894 Chronic pain syndrome: Secondary | ICD-10-CM | POA: Diagnosis not present

## 2016-11-18 DIAGNOSIS — G894 Chronic pain syndrome: Secondary | ICD-10-CM | POA: Diagnosis not present

## 2016-11-18 DIAGNOSIS — M179 Osteoarthritis of knee, unspecified: Secondary | ICD-10-CM | POA: Diagnosis not present

## 2016-11-19 DIAGNOSIS — G894 Chronic pain syndrome: Secondary | ICD-10-CM | POA: Diagnosis not present

## 2016-11-19 DIAGNOSIS — M179 Osteoarthritis of knee, unspecified: Secondary | ICD-10-CM | POA: Diagnosis not present

## 2016-11-20 DIAGNOSIS — M179 Osteoarthritis of knee, unspecified: Secondary | ICD-10-CM | POA: Diagnosis not present

## 2016-11-20 DIAGNOSIS — G894 Chronic pain syndrome: Secondary | ICD-10-CM | POA: Diagnosis not present

## 2016-11-21 DIAGNOSIS — M179 Osteoarthritis of knee, unspecified: Secondary | ICD-10-CM | POA: Diagnosis not present

## 2016-11-21 DIAGNOSIS — G894 Chronic pain syndrome: Secondary | ICD-10-CM | POA: Diagnosis not present

## 2016-11-22 DIAGNOSIS — G894 Chronic pain syndrome: Secondary | ICD-10-CM | POA: Diagnosis not present

## 2016-11-22 DIAGNOSIS — M179 Osteoarthritis of knee, unspecified: Secondary | ICD-10-CM | POA: Diagnosis not present

## 2016-11-23 DIAGNOSIS — G894 Chronic pain syndrome: Secondary | ICD-10-CM | POA: Diagnosis not present

## 2016-11-23 DIAGNOSIS — M179 Osteoarthritis of knee, unspecified: Secondary | ICD-10-CM | POA: Diagnosis not present

## 2016-11-24 DIAGNOSIS — F250 Schizoaffective disorder, bipolar type: Secondary | ICD-10-CM | POA: Diagnosis not present

## 2016-11-24 DIAGNOSIS — M179 Osteoarthritis of knee, unspecified: Secondary | ICD-10-CM | POA: Diagnosis not present

## 2016-11-24 DIAGNOSIS — G894 Chronic pain syndrome: Secondary | ICD-10-CM | POA: Diagnosis not present

## 2016-11-25 DIAGNOSIS — M179 Osteoarthritis of knee, unspecified: Secondary | ICD-10-CM | POA: Diagnosis not present

## 2016-11-25 DIAGNOSIS — G894 Chronic pain syndrome: Secondary | ICD-10-CM | POA: Diagnosis not present

## 2016-11-26 DIAGNOSIS — G894 Chronic pain syndrome: Secondary | ICD-10-CM | POA: Diagnosis not present

## 2016-11-26 DIAGNOSIS — M179 Osteoarthritis of knee, unspecified: Secondary | ICD-10-CM | POA: Diagnosis not present

## 2016-11-27 DIAGNOSIS — M179 Osteoarthritis of knee, unspecified: Secondary | ICD-10-CM | POA: Diagnosis not present

## 2016-11-27 DIAGNOSIS — G894 Chronic pain syndrome: Secondary | ICD-10-CM | POA: Diagnosis not present

## 2016-11-28 DIAGNOSIS — G894 Chronic pain syndrome: Secondary | ICD-10-CM | POA: Diagnosis not present

## 2016-11-28 DIAGNOSIS — M179 Osteoarthritis of knee, unspecified: Secondary | ICD-10-CM | POA: Diagnosis not present

## 2016-11-29 DIAGNOSIS — G894 Chronic pain syndrome: Secondary | ICD-10-CM | POA: Diagnosis not present

## 2016-11-29 DIAGNOSIS — M179 Osteoarthritis of knee, unspecified: Secondary | ICD-10-CM | POA: Diagnosis not present

## 2016-11-30 DIAGNOSIS — M179 Osteoarthritis of knee, unspecified: Secondary | ICD-10-CM | POA: Diagnosis not present

## 2016-11-30 DIAGNOSIS — G894 Chronic pain syndrome: Secondary | ICD-10-CM | POA: Diagnosis not present

## 2016-12-01 ENCOUNTER — Telehealth (HOSPITAL_COMMUNITY): Payer: Self-pay | Admitting: *Deleted

## 2016-12-01 DIAGNOSIS — G894 Chronic pain syndrome: Secondary | ICD-10-CM | POA: Diagnosis not present

## 2016-12-01 DIAGNOSIS — M179 Osteoarthritis of knee, unspecified: Secondary | ICD-10-CM | POA: Diagnosis not present

## 2016-12-01 NOTE — Telephone Encounter (Signed)
Patient given detailed instructions per Myocardial Perfusion Study Information Sheet for the test on 12/07/16 Patient notified to arrive 15 minutes early and that it is imperative to arrive on time for appointment to keep from having the test rescheduled.  If you need to cancel or reschedule your appointment, please call the office within 24 hours of your appointment. . Patient verbalized understanding. Kirstie Peri

## 2016-12-02 DIAGNOSIS — M179 Osteoarthritis of knee, unspecified: Secondary | ICD-10-CM | POA: Diagnosis not present

## 2016-12-02 DIAGNOSIS — G894 Chronic pain syndrome: Secondary | ICD-10-CM | POA: Diagnosis not present

## 2016-12-03 DIAGNOSIS — M179 Osteoarthritis of knee, unspecified: Secondary | ICD-10-CM | POA: Diagnosis not present

## 2016-12-03 DIAGNOSIS — G894 Chronic pain syndrome: Secondary | ICD-10-CM | POA: Diagnosis not present

## 2016-12-04 DIAGNOSIS — G894 Chronic pain syndrome: Secondary | ICD-10-CM | POA: Diagnosis not present

## 2016-12-04 DIAGNOSIS — M179 Osteoarthritis of knee, unspecified: Secondary | ICD-10-CM | POA: Diagnosis not present

## 2016-12-05 DIAGNOSIS — M179 Osteoarthritis of knee, unspecified: Secondary | ICD-10-CM | POA: Diagnosis not present

## 2016-12-05 DIAGNOSIS — G894 Chronic pain syndrome: Secondary | ICD-10-CM | POA: Diagnosis not present

## 2016-12-06 DIAGNOSIS — M179 Osteoarthritis of knee, unspecified: Secondary | ICD-10-CM | POA: Diagnosis not present

## 2016-12-06 DIAGNOSIS — G894 Chronic pain syndrome: Secondary | ICD-10-CM | POA: Diagnosis not present

## 2016-12-07 ENCOUNTER — Ambulatory Visit (HOSPITAL_COMMUNITY): Payer: Medicare HMO | Attending: Cardiology

## 2016-12-07 DIAGNOSIS — E119 Type 2 diabetes mellitus without complications: Secondary | ICD-10-CM | POA: Insufficient documentation

## 2016-12-07 DIAGNOSIS — R079 Chest pain, unspecified: Secondary | ICD-10-CM

## 2016-12-07 DIAGNOSIS — R0609 Other forms of dyspnea: Secondary | ICD-10-CM | POA: Diagnosis not present

## 2016-12-07 DIAGNOSIS — R0789 Other chest pain: Secondary | ICD-10-CM | POA: Diagnosis not present

## 2016-12-07 DIAGNOSIS — G894 Chronic pain syndrome: Secondary | ICD-10-CM | POA: Diagnosis not present

## 2016-12-07 DIAGNOSIS — F17200 Nicotine dependence, unspecified, uncomplicated: Secondary | ICD-10-CM | POA: Diagnosis not present

## 2016-12-07 DIAGNOSIS — M179 Osteoarthritis of knee, unspecified: Secondary | ICD-10-CM | POA: Diagnosis not present

## 2016-12-07 DIAGNOSIS — I10 Essential (primary) hypertension: Secondary | ICD-10-CM | POA: Insufficient documentation

## 2016-12-07 DIAGNOSIS — M6258 Muscle wasting and atrophy, not elsewhere classified, other site: Secondary | ICD-10-CM | POA: Diagnosis not present

## 2016-12-07 MED ORDER — REGADENOSON 0.4 MG/5ML IV SOLN
0.4000 mg | Freq: Once | INTRAVENOUS | Status: AC
  Administered 2016-12-07: 0.4 mg via INTRAVENOUS

## 2016-12-07 MED ORDER — TECHNETIUM TC 99M TETROFOSMIN IV KIT
32.9000 | PACK | Freq: Once | INTRAVENOUS | Status: AC | PRN
  Administered 2016-12-07: 32.9 via INTRAVENOUS
  Filled 2016-12-07: qty 33

## 2016-12-08 ENCOUNTER — Ambulatory Visit (HOSPITAL_COMMUNITY): Payer: Medicare HMO | Attending: Cardiovascular Disease

## 2016-12-08 DIAGNOSIS — M25561 Pain in right knee: Secondary | ICD-10-CM | POA: Diagnosis not present

## 2016-12-08 DIAGNOSIS — M179 Osteoarthritis of knee, unspecified: Secondary | ICD-10-CM | POA: Diagnosis not present

## 2016-12-08 DIAGNOSIS — M47816 Spondylosis without myelopathy or radiculopathy, lumbar region: Secondary | ICD-10-CM | POA: Diagnosis not present

## 2016-12-08 DIAGNOSIS — G894 Chronic pain syndrome: Secondary | ICD-10-CM | POA: Diagnosis not present

## 2016-12-08 LAB — MYOCARDIAL PERFUSION IMAGING
LV dias vol: 112 mL (ref 46–106)
LV sys vol: 45 mL
Peak HR: 87 {beats}/min
RATE: 0.21
Rest HR: 73 {beats}/min
SDS: 4
SRS: 0
SSS: 4
TID: 0.86

## 2016-12-08 MED ORDER — TECHNETIUM TC 99M TETROFOSMIN IV KIT
32.5000 | PACK | Freq: Once | INTRAVENOUS | Status: AC | PRN
  Administered 2016-12-08: 32.5 via INTRAVENOUS
  Filled 2016-12-08: qty 33

## 2016-12-09 ENCOUNTER — Telehealth: Payer: Self-pay | Admitting: Interventional Cardiology

## 2016-12-09 DIAGNOSIS — M179 Osteoarthritis of knee, unspecified: Secondary | ICD-10-CM | POA: Diagnosis not present

## 2016-12-09 DIAGNOSIS — G894 Chronic pain syndrome: Secondary | ICD-10-CM | POA: Diagnosis not present

## 2016-12-09 NOTE — Telephone Encounter (Signed)
New message     Pt is returning your call about test results leave complete message on vm if you do not get her she is in classes

## 2016-12-09 NOTE — Telephone Encounter (Signed)
I last saw her 10 months ago. I'm not inclined to do further testing without sitting with her and having further discussion. It will be okay to set her up for an office visit to follow-up and rediscuss her complaints in the next 2-4 weeks where there is an open slot

## 2016-12-09 NOTE — Telephone Encounter (Signed)
Spoke with pt and went over myoview results.  Pt verbalized understanding.  Pt states that she is still having intermittent CP.  States it radiates into her right arm and it tingles/goes numb.  Pt then takes a nitro and sx resolve.  Asked pt if she has seen PCP about this and she said yes and PCP sent her to [place] and said Cardiology would take over these issues.  Advised I would send message to Dr. Tamala Julian for review.

## 2016-12-10 DIAGNOSIS — G894 Chronic pain syndrome: Secondary | ICD-10-CM | POA: Diagnosis not present

## 2016-12-10 DIAGNOSIS — M179 Osteoarthritis of knee, unspecified: Secondary | ICD-10-CM | POA: Diagnosis not present

## 2016-12-10 NOTE — Telephone Encounter (Signed)
Spoke with pt and made her aware of recommendations per Dr. Tamala Julian.  Pt already had appt on 12/21/16 with Dr. Tamala Julian. Pt agreeable to this appt.  Pt appreciative for call.

## 2016-12-11 DIAGNOSIS — M179 Osteoarthritis of knee, unspecified: Secondary | ICD-10-CM | POA: Diagnosis not present

## 2016-12-11 DIAGNOSIS — G894 Chronic pain syndrome: Secondary | ICD-10-CM | POA: Diagnosis not present

## 2016-12-12 DIAGNOSIS — G894 Chronic pain syndrome: Secondary | ICD-10-CM | POA: Diagnosis not present

## 2016-12-12 DIAGNOSIS — M179 Osteoarthritis of knee, unspecified: Secondary | ICD-10-CM | POA: Diagnosis not present

## 2016-12-13 DIAGNOSIS — G894 Chronic pain syndrome: Secondary | ICD-10-CM | POA: Diagnosis not present

## 2016-12-13 DIAGNOSIS — M179 Osteoarthritis of knee, unspecified: Secondary | ICD-10-CM | POA: Diagnosis not present

## 2016-12-14 DIAGNOSIS — M179 Osteoarthritis of knee, unspecified: Secondary | ICD-10-CM | POA: Diagnosis not present

## 2016-12-14 DIAGNOSIS — G894 Chronic pain syndrome: Secondary | ICD-10-CM | POA: Diagnosis not present

## 2016-12-15 DIAGNOSIS — G894 Chronic pain syndrome: Secondary | ICD-10-CM | POA: Diagnosis not present

## 2016-12-15 DIAGNOSIS — M179 Osteoarthritis of knee, unspecified: Secondary | ICD-10-CM | POA: Diagnosis not present

## 2016-12-16 DIAGNOSIS — G894 Chronic pain syndrome: Secondary | ICD-10-CM | POA: Diagnosis not present

## 2016-12-16 DIAGNOSIS — M179 Osteoarthritis of knee, unspecified: Secondary | ICD-10-CM | POA: Diagnosis not present

## 2016-12-17 DIAGNOSIS — M179 Osteoarthritis of knee, unspecified: Secondary | ICD-10-CM | POA: Diagnosis not present

## 2016-12-17 DIAGNOSIS — G894 Chronic pain syndrome: Secondary | ICD-10-CM | POA: Diagnosis not present

## 2016-12-18 DIAGNOSIS — M179 Osteoarthritis of knee, unspecified: Secondary | ICD-10-CM | POA: Diagnosis not present

## 2016-12-18 DIAGNOSIS — G894 Chronic pain syndrome: Secondary | ICD-10-CM | POA: Diagnosis not present

## 2016-12-19 DIAGNOSIS — M179 Osteoarthritis of knee, unspecified: Secondary | ICD-10-CM | POA: Diagnosis not present

## 2016-12-19 DIAGNOSIS — G894 Chronic pain syndrome: Secondary | ICD-10-CM | POA: Diagnosis not present

## 2016-12-20 ENCOUNTER — Other Ambulatory Visit: Payer: Self-pay | Admitting: Family Medicine

## 2016-12-20 ENCOUNTER — Ambulatory Visit
Admission: RE | Admit: 2016-12-20 | Discharge: 2016-12-20 | Disposition: A | Discharge status: Home / Self Care | Payer: Medicare HMO | Source: Ambulatory Visit | Attending: Family Medicine | Admitting: Family Medicine

## 2016-12-20 DIAGNOSIS — N611 Abscess of the breast and nipple: Secondary | ICD-10-CM | POA: Diagnosis not present

## 2016-12-20 DIAGNOSIS — N63 Unspecified lump in unspecified breast: Secondary | ICD-10-CM

## 2016-12-20 DIAGNOSIS — M179 Osteoarthritis of knee, unspecified: Secondary | ICD-10-CM | POA: Diagnosis not present

## 2016-12-20 DIAGNOSIS — N6310 Unspecified lump in the right breast, unspecified quadrant: Secondary | ICD-10-CM | POA: Diagnosis not present

## 2016-12-20 DIAGNOSIS — G894 Chronic pain syndrome: Secondary | ICD-10-CM | POA: Diagnosis not present

## 2016-12-20 DIAGNOSIS — R928 Other abnormal and inconclusive findings on diagnostic imaging of breast: Secondary | ICD-10-CM | POA: Diagnosis not present

## 2016-12-21 ENCOUNTER — Ambulatory Visit: Payer: Self-pay | Admitting: Interventional Cardiology

## 2016-12-21 DIAGNOSIS — M179 Osteoarthritis of knee, unspecified: Secondary | ICD-10-CM | POA: Diagnosis not present

## 2016-12-21 DIAGNOSIS — G894 Chronic pain syndrome: Secondary | ICD-10-CM | POA: Diagnosis not present

## 2016-12-22 DIAGNOSIS — G894 Chronic pain syndrome: Secondary | ICD-10-CM | POA: Diagnosis not present

## 2016-12-22 DIAGNOSIS — M179 Osteoarthritis of knee, unspecified: Secondary | ICD-10-CM | POA: Diagnosis not present

## 2016-12-23 ENCOUNTER — Other Ambulatory Visit: Payer: Self-pay

## 2016-12-23 DIAGNOSIS — G894 Chronic pain syndrome: Secondary | ICD-10-CM | POA: Diagnosis not present

## 2016-12-23 DIAGNOSIS — M179 Osteoarthritis of knee, unspecified: Secondary | ICD-10-CM | POA: Diagnosis not present

## 2016-12-24 ENCOUNTER — Other Ambulatory Visit: Payer: Self-pay

## 2016-12-24 DIAGNOSIS — M179 Osteoarthritis of knee, unspecified: Secondary | ICD-10-CM | POA: Diagnosis not present

## 2016-12-24 DIAGNOSIS — G894 Chronic pain syndrome: Secondary | ICD-10-CM | POA: Diagnosis not present

## 2016-12-25 DIAGNOSIS — G894 Chronic pain syndrome: Secondary | ICD-10-CM | POA: Diagnosis not present

## 2016-12-25 DIAGNOSIS — M179 Osteoarthritis of knee, unspecified: Secondary | ICD-10-CM | POA: Diagnosis not present

## 2016-12-25 LAB — AEROBIC/ANAEROBIC CULTURE (SURGICAL/DEEP WOUND)

## 2016-12-25 LAB — AEROBIC/ANAEROBIC CULTURE W GRAM STAIN (SURGICAL/DEEP WOUND)

## 2016-12-26 DIAGNOSIS — G894 Chronic pain syndrome: Secondary | ICD-10-CM | POA: Diagnosis not present

## 2016-12-26 DIAGNOSIS — M179 Osteoarthritis of knee, unspecified: Secondary | ICD-10-CM | POA: Diagnosis not present

## 2016-12-27 DIAGNOSIS — G894 Chronic pain syndrome: Secondary | ICD-10-CM | POA: Diagnosis not present

## 2016-12-27 DIAGNOSIS — M1712 Unilateral primary osteoarthritis, left knee: Secondary | ICD-10-CM | POA: Diagnosis not present

## 2016-12-27 DIAGNOSIS — G8929 Other chronic pain: Secondary | ICD-10-CM | POA: Diagnosis not present

## 2016-12-27 DIAGNOSIS — M179 Osteoarthritis of knee, unspecified: Secondary | ICD-10-CM | POA: Diagnosis not present

## 2016-12-27 DIAGNOSIS — Z96651 Presence of right artificial knee joint: Secondary | ICD-10-CM | POA: Diagnosis not present

## 2016-12-27 DIAGNOSIS — M25561 Pain in right knee: Secondary | ICD-10-CM | POA: Diagnosis not present

## 2016-12-27 DIAGNOSIS — M7051 Other bursitis of knee, right knee: Secondary | ICD-10-CM | POA: Diagnosis not present

## 2016-12-27 DIAGNOSIS — M25562 Pain in left knee: Secondary | ICD-10-CM | POA: Diagnosis not present

## 2016-12-27 DIAGNOSIS — M47816 Spondylosis without myelopathy or radiculopathy, lumbar region: Secondary | ICD-10-CM | POA: Diagnosis not present

## 2016-12-28 DIAGNOSIS — G894 Chronic pain syndrome: Secondary | ICD-10-CM | POA: Diagnosis not present

## 2016-12-28 DIAGNOSIS — M179 Osteoarthritis of knee, unspecified: Secondary | ICD-10-CM | POA: Diagnosis not present

## 2016-12-29 ENCOUNTER — Other Ambulatory Visit: Payer: Self-pay | Admitting: Family Medicine

## 2016-12-29 ENCOUNTER — Ambulatory Visit
Admission: RE | Admit: 2016-12-29 | Discharge: 2016-12-29 | Disposition: A | Discharge status: Home / Self Care | Payer: Medicare HMO | Source: Ambulatory Visit | Attending: Family Medicine | Admitting: Family Medicine

## 2016-12-29 DIAGNOSIS — M179 Osteoarthritis of knee, unspecified: Secondary | ICD-10-CM | POA: Diagnosis not present

## 2016-12-29 DIAGNOSIS — N611 Abscess of the breast and nipple: Secondary | ICD-10-CM

## 2016-12-29 DIAGNOSIS — G894 Chronic pain syndrome: Secondary | ICD-10-CM | POA: Diagnosis not present

## 2016-12-30 DIAGNOSIS — M179 Osteoarthritis of knee, unspecified: Secondary | ICD-10-CM | POA: Diagnosis not present

## 2016-12-30 DIAGNOSIS — G894 Chronic pain syndrome: Secondary | ICD-10-CM | POA: Diagnosis not present

## 2016-12-31 ENCOUNTER — Ambulatory Visit
Admission: RE | Admit: 2016-12-31 | Discharge: 2016-12-31 | Disposition: A | Discharge status: Home / Self Care | Payer: Medicare HMO | Source: Ambulatory Visit | Attending: Family Medicine | Admitting: Family Medicine

## 2016-12-31 ENCOUNTER — Other Ambulatory Visit: Payer: Self-pay | Admitting: Family Medicine

## 2016-12-31 DIAGNOSIS — N611 Abscess of the breast and nipple: Secondary | ICD-10-CM

## 2016-12-31 DIAGNOSIS — M179 Osteoarthritis of knee, unspecified: Secondary | ICD-10-CM | POA: Diagnosis not present

## 2016-12-31 DIAGNOSIS — G894 Chronic pain syndrome: Secondary | ICD-10-CM | POA: Diagnosis not present

## 2017-01-01 DIAGNOSIS — G894 Chronic pain syndrome: Secondary | ICD-10-CM | POA: Diagnosis not present

## 2017-01-01 DIAGNOSIS — M179 Osteoarthritis of knee, unspecified: Secondary | ICD-10-CM | POA: Diagnosis not present

## 2017-01-02 DIAGNOSIS — G894 Chronic pain syndrome: Secondary | ICD-10-CM | POA: Diagnosis not present

## 2017-01-02 DIAGNOSIS — M179 Osteoarthritis of knee, unspecified: Secondary | ICD-10-CM | POA: Diagnosis not present

## 2017-01-03 DIAGNOSIS — M179 Osteoarthritis of knee, unspecified: Secondary | ICD-10-CM | POA: Diagnosis not present

## 2017-01-03 DIAGNOSIS — G894 Chronic pain syndrome: Secondary | ICD-10-CM | POA: Diagnosis not present

## 2017-01-04 DIAGNOSIS — G894 Chronic pain syndrome: Secondary | ICD-10-CM | POA: Diagnosis not present

## 2017-01-04 DIAGNOSIS — M179 Osteoarthritis of knee, unspecified: Secondary | ICD-10-CM | POA: Diagnosis not present

## 2017-01-05 DIAGNOSIS — M179 Osteoarthritis of knee, unspecified: Secondary | ICD-10-CM | POA: Diagnosis not present

## 2017-01-05 DIAGNOSIS — G894 Chronic pain syndrome: Secondary | ICD-10-CM | POA: Diagnosis not present

## 2017-01-06 DIAGNOSIS — G894 Chronic pain syndrome: Secondary | ICD-10-CM | POA: Diagnosis not present

## 2017-01-06 DIAGNOSIS — M179 Osteoarthritis of knee, unspecified: Secondary | ICD-10-CM | POA: Diagnosis not present

## 2017-01-06 DIAGNOSIS — M6258 Muscle wasting and atrophy, not elsewhere classified, other site: Secondary | ICD-10-CM | POA: Diagnosis not present

## 2017-01-07 DIAGNOSIS — G894 Chronic pain syndrome: Secondary | ICD-10-CM | POA: Diagnosis not present

## 2017-01-07 DIAGNOSIS — M179 Osteoarthritis of knee, unspecified: Secondary | ICD-10-CM | POA: Diagnosis not present

## 2017-01-08 DIAGNOSIS — M179 Osteoarthritis of knee, unspecified: Secondary | ICD-10-CM | POA: Diagnosis not present

## 2017-01-08 DIAGNOSIS — G894 Chronic pain syndrome: Secondary | ICD-10-CM | POA: Diagnosis not present

## 2017-01-09 DIAGNOSIS — M179 Osteoarthritis of knee, unspecified: Secondary | ICD-10-CM | POA: Diagnosis not present

## 2017-01-09 DIAGNOSIS — G894 Chronic pain syndrome: Secondary | ICD-10-CM | POA: Diagnosis not present

## 2017-01-10 ENCOUNTER — Inpatient Hospital Stay
Admission: RE | Admit: 2017-01-10 | Discharge: 2017-01-10 | Disposition: A | Discharge status: Home / Self Care | Payer: Medicare HMO | Source: Ambulatory Visit | Attending: Family Medicine | Admitting: Family Medicine

## 2017-01-10 DIAGNOSIS — M179 Osteoarthritis of knee, unspecified: Secondary | ICD-10-CM | POA: Diagnosis not present

## 2017-01-10 DIAGNOSIS — G894 Chronic pain syndrome: Secondary | ICD-10-CM | POA: Diagnosis not present

## 2017-01-11 DIAGNOSIS — M179 Osteoarthritis of knee, unspecified: Secondary | ICD-10-CM | POA: Diagnosis not present

## 2017-01-11 DIAGNOSIS — G894 Chronic pain syndrome: Secondary | ICD-10-CM | POA: Diagnosis not present

## 2017-01-12 DIAGNOSIS — F423 Hoarding disorder: Secondary | ICD-10-CM | POA: Diagnosis not present

## 2017-01-12 DIAGNOSIS — G894 Chronic pain syndrome: Secondary | ICD-10-CM | POA: Diagnosis not present

## 2017-01-12 DIAGNOSIS — M179 Osteoarthritis of knee, unspecified: Secondary | ICD-10-CM | POA: Diagnosis not present

## 2017-01-12 DIAGNOSIS — F250 Schizoaffective disorder, bipolar type: Secondary | ICD-10-CM | POA: Diagnosis not present

## 2017-01-13 DIAGNOSIS — M179 Osteoarthritis of knee, unspecified: Secondary | ICD-10-CM | POA: Diagnosis not present

## 2017-01-13 DIAGNOSIS — M47816 Spondylosis without myelopathy or radiculopathy, lumbar region: Secondary | ICD-10-CM | POA: Diagnosis not present

## 2017-01-13 DIAGNOSIS — G894 Chronic pain syndrome: Secondary | ICD-10-CM | POA: Diagnosis not present

## 2017-01-14 DIAGNOSIS — M179 Osteoarthritis of knee, unspecified: Secondary | ICD-10-CM | POA: Diagnosis not present

## 2017-01-14 DIAGNOSIS — G894 Chronic pain syndrome: Secondary | ICD-10-CM | POA: Diagnosis not present

## 2017-01-15 DIAGNOSIS — G894 Chronic pain syndrome: Secondary | ICD-10-CM | POA: Diagnosis not present

## 2017-01-15 DIAGNOSIS — M179 Osteoarthritis of knee, unspecified: Secondary | ICD-10-CM | POA: Diagnosis not present

## 2017-01-16 DIAGNOSIS — G894 Chronic pain syndrome: Secondary | ICD-10-CM | POA: Diagnosis not present

## 2017-01-16 DIAGNOSIS — M179 Osteoarthritis of knee, unspecified: Secondary | ICD-10-CM | POA: Diagnosis not present

## 2017-01-17 DIAGNOSIS — F250 Schizoaffective disorder, bipolar type: Secondary | ICD-10-CM | POA: Diagnosis not present

## 2017-01-17 DIAGNOSIS — G894 Chronic pain syndrome: Secondary | ICD-10-CM | POA: Diagnosis not present

## 2017-01-17 DIAGNOSIS — M179 Osteoarthritis of knee, unspecified: Secondary | ICD-10-CM | POA: Diagnosis not present

## 2017-01-18 DIAGNOSIS — G894 Chronic pain syndrome: Secondary | ICD-10-CM | POA: Diagnosis not present

## 2017-01-18 DIAGNOSIS — M179 Osteoarthritis of knee, unspecified: Secondary | ICD-10-CM | POA: Diagnosis not present

## 2017-01-19 DIAGNOSIS — M179 Osteoarthritis of knee, unspecified: Secondary | ICD-10-CM | POA: Diagnosis not present

## 2017-01-19 DIAGNOSIS — G894 Chronic pain syndrome: Secondary | ICD-10-CM | POA: Diagnosis not present

## 2017-01-20 DIAGNOSIS — M179 Osteoarthritis of knee, unspecified: Secondary | ICD-10-CM | POA: Diagnosis not present

## 2017-01-20 DIAGNOSIS — G894 Chronic pain syndrome: Secondary | ICD-10-CM | POA: Diagnosis not present

## 2017-01-21 DIAGNOSIS — G894 Chronic pain syndrome: Secondary | ICD-10-CM | POA: Diagnosis not present

## 2017-01-21 DIAGNOSIS — M179 Osteoarthritis of knee, unspecified: Secondary | ICD-10-CM | POA: Diagnosis not present

## 2017-01-22 DIAGNOSIS — G894 Chronic pain syndrome: Secondary | ICD-10-CM | POA: Diagnosis not present

## 2017-01-22 DIAGNOSIS — M179 Osteoarthritis of knee, unspecified: Secondary | ICD-10-CM | POA: Diagnosis not present

## 2017-01-23 DIAGNOSIS — M179 Osteoarthritis of knee, unspecified: Secondary | ICD-10-CM | POA: Diagnosis not present

## 2017-01-23 DIAGNOSIS — G894 Chronic pain syndrome: Secondary | ICD-10-CM | POA: Diagnosis not present

## 2017-01-24 DIAGNOSIS — M179 Osteoarthritis of knee, unspecified: Secondary | ICD-10-CM | POA: Diagnosis not present

## 2017-01-24 DIAGNOSIS — F250 Schizoaffective disorder, bipolar type: Secondary | ICD-10-CM | POA: Diagnosis not present

## 2017-01-24 DIAGNOSIS — G894 Chronic pain syndrome: Secondary | ICD-10-CM | POA: Diagnosis not present

## 2017-01-25 DIAGNOSIS — M179 Osteoarthritis of knee, unspecified: Secondary | ICD-10-CM | POA: Diagnosis not present

## 2017-01-25 DIAGNOSIS — G894 Chronic pain syndrome: Secondary | ICD-10-CM | POA: Diagnosis not present

## 2017-01-26 DIAGNOSIS — G894 Chronic pain syndrome: Secondary | ICD-10-CM | POA: Diagnosis not present

## 2017-01-26 DIAGNOSIS — M179 Osteoarthritis of knee, unspecified: Secondary | ICD-10-CM | POA: Diagnosis not present

## 2017-01-27 DIAGNOSIS — M179 Osteoarthritis of knee, unspecified: Secondary | ICD-10-CM | POA: Diagnosis not present

## 2017-01-27 DIAGNOSIS — G894 Chronic pain syndrome: Secondary | ICD-10-CM | POA: Diagnosis not present

## 2017-01-28 DIAGNOSIS — G894 Chronic pain syndrome: Secondary | ICD-10-CM | POA: Diagnosis not present

## 2017-01-28 DIAGNOSIS — M179 Osteoarthritis of knee, unspecified: Secondary | ICD-10-CM | POA: Diagnosis not present

## 2017-01-29 DIAGNOSIS — M179 Osteoarthritis of knee, unspecified: Secondary | ICD-10-CM | POA: Diagnosis not present

## 2017-01-29 DIAGNOSIS — G894 Chronic pain syndrome: Secondary | ICD-10-CM | POA: Diagnosis not present

## 2017-01-30 DIAGNOSIS — M179 Osteoarthritis of knee, unspecified: Secondary | ICD-10-CM | POA: Diagnosis not present

## 2017-01-30 DIAGNOSIS — G894 Chronic pain syndrome: Secondary | ICD-10-CM | POA: Diagnosis not present

## 2017-01-31 DIAGNOSIS — M179 Osteoarthritis of knee, unspecified: Secondary | ICD-10-CM | POA: Diagnosis not present

## 2017-01-31 DIAGNOSIS — G894 Chronic pain syndrome: Secondary | ICD-10-CM | POA: Diagnosis not present

## 2017-01-31 DIAGNOSIS — F250 Schizoaffective disorder, bipolar type: Secondary | ICD-10-CM | POA: Diagnosis not present

## 2017-02-01 DIAGNOSIS — G894 Chronic pain syndrome: Secondary | ICD-10-CM | POA: Diagnosis not present

## 2017-02-01 DIAGNOSIS — M179 Osteoarthritis of knee, unspecified: Secondary | ICD-10-CM | POA: Diagnosis not present

## 2017-02-02 DIAGNOSIS — M179 Osteoarthritis of knee, unspecified: Secondary | ICD-10-CM | POA: Diagnosis not present

## 2017-02-02 DIAGNOSIS — G894 Chronic pain syndrome: Secondary | ICD-10-CM | POA: Diagnosis not present

## 2017-02-03 DIAGNOSIS — G894 Chronic pain syndrome: Secondary | ICD-10-CM | POA: Diagnosis not present

## 2017-02-03 DIAGNOSIS — M47816 Spondylosis without myelopathy or radiculopathy, lumbar region: Secondary | ICD-10-CM | POA: Diagnosis not present

## 2017-02-03 DIAGNOSIS — M179 Osteoarthritis of knee, unspecified: Secondary | ICD-10-CM | POA: Diagnosis not present

## 2017-02-04 DIAGNOSIS — G894 Chronic pain syndrome: Secondary | ICD-10-CM | POA: Diagnosis not present

## 2017-02-04 DIAGNOSIS — M179 Osteoarthritis of knee, unspecified: Secondary | ICD-10-CM | POA: Diagnosis not present

## 2017-02-05 DIAGNOSIS — G894 Chronic pain syndrome: Secondary | ICD-10-CM | POA: Diagnosis not present

## 2017-02-05 DIAGNOSIS — M179 Osteoarthritis of knee, unspecified: Secondary | ICD-10-CM | POA: Diagnosis not present

## 2017-02-06 DIAGNOSIS — M6258 Muscle wasting and atrophy, not elsewhere classified, other site: Secondary | ICD-10-CM | POA: Diagnosis not present

## 2017-02-06 DIAGNOSIS — M179 Osteoarthritis of knee, unspecified: Secondary | ICD-10-CM | POA: Diagnosis not present

## 2017-02-06 DIAGNOSIS — G894 Chronic pain syndrome: Secondary | ICD-10-CM | POA: Diagnosis not present

## 2017-02-07 DIAGNOSIS — M179 Osteoarthritis of knee, unspecified: Secondary | ICD-10-CM | POA: Diagnosis not present

## 2017-02-07 DIAGNOSIS — G894 Chronic pain syndrome: Secondary | ICD-10-CM | POA: Diagnosis not present

## 2017-02-08 DIAGNOSIS — M179 Osteoarthritis of knee, unspecified: Secondary | ICD-10-CM | POA: Diagnosis not present

## 2017-02-08 DIAGNOSIS — G894 Chronic pain syndrome: Secondary | ICD-10-CM | POA: Diagnosis not present

## 2017-02-09 DIAGNOSIS — G894 Chronic pain syndrome: Secondary | ICD-10-CM | POA: Diagnosis not present

## 2017-02-09 DIAGNOSIS — M179 Osteoarthritis of knee, unspecified: Secondary | ICD-10-CM | POA: Diagnosis not present

## 2017-02-10 DIAGNOSIS — Z96651 Presence of right artificial knee joint: Secondary | ICD-10-CM | POA: Diagnosis not present

## 2017-02-10 DIAGNOSIS — M7051 Other bursitis of knee, right knee: Secondary | ICD-10-CM | POA: Diagnosis not present

## 2017-02-10 DIAGNOSIS — G8929 Other chronic pain: Secondary | ICD-10-CM | POA: Diagnosis not present

## 2017-02-10 DIAGNOSIS — G894 Chronic pain syndrome: Secondary | ICD-10-CM | POA: Diagnosis not present

## 2017-02-10 DIAGNOSIS — M25561 Pain in right knee: Secondary | ICD-10-CM | POA: Diagnosis not present

## 2017-02-10 DIAGNOSIS — M179 Osteoarthritis of knee, unspecified: Secondary | ICD-10-CM | POA: Diagnosis not present

## 2017-02-11 ENCOUNTER — Encounter (HOSPITAL_COMMUNITY): Payer: Self-pay | Admitting: *Deleted

## 2017-02-11 ENCOUNTER — Ambulatory Visit (HOSPITAL_COMMUNITY)
Admission: EM | Admit: 2017-02-11 | Discharge: 2017-02-11 | Disposition: A | Discharge status: Home / Self Care | Payer: Medicare HMO

## 2017-02-11 DIAGNOSIS — G8929 Other chronic pain: Secondary | ICD-10-CM

## 2017-02-11 DIAGNOSIS — G894 Chronic pain syndrome: Secondary | ICD-10-CM | POA: Diagnosis not present

## 2017-02-11 DIAGNOSIS — M5441 Lumbago with sciatica, right side: Secondary | ICD-10-CM

## 2017-02-11 DIAGNOSIS — M179 Osteoarthritis of knee, unspecified: Secondary | ICD-10-CM | POA: Diagnosis not present

## 2017-02-11 MED ORDER — KETOROLAC TROMETHAMINE 60 MG/2ML IM SOLN
60.0000 mg | Freq: Once | INTRAMUSCULAR | Status: AC
  Administered 2017-02-11: 60 mg via INTRAMUSCULAR

## 2017-02-11 MED ORDER — KETOROLAC TROMETHAMINE 60 MG/2ML IM SOLN
INTRAMUSCULAR | Status: AC
  Filled 2017-02-11: qty 2

## 2017-02-11 MED ORDER — PREDNISONE 10 MG PO TABS: ORAL_TABLET | ORAL | 0 refills | Status: DC

## 2017-02-11 NOTE — ED Provider Notes (Signed)
[employer] [facility]    CSN: [id number] Arrival date & time: 02/11/17  1704     History   Chief Complaint Chief Complaint  Patient presents with  . Fall  . Back Pain    HPI Caroline Williams is a 56 y.o. female.   The history is provided by the patient. No language interpreter was used.  Fall  This is a new problem. The current episode started more than 1 week ago. The problem occurs constantly. The problem has been gradually worsening. Nothing aggravates the symptoms. Nothing relieves the symptoms. She has tried nothing for the symptoms. The treatment provided no relief.  Back Pain   Pt is on morphine and oxycodone.  Pt is requesting a shot of torodol and prednisone.  Pt reports this helps when she has severe pain Past Medical History:  Diagnosis Date  . Anxiety   . Arthritis   . Asthma   . Bipolar 1 disorder ([place])   . Breast discharge   . Breast lump   . Breast pain   . Depression   . Diabetes mellitus   . Fever   . GERD (gastroesophageal reflux disease)   . Graves disease   . Graves disease   . History of knee replacement, total   . Hypertension   . Hypothyroidism   . Pneumonia    2015,2014  . Sleep apnea   . Thyroid disease     Patient Active Problem List   Diagnosis Date Noted  . Chest pain 01/26/2016  . Sepsis due to Escherichia coli ([place])   . E coli bacteremia   . E. coli UTI   . Hallucination, drug-induced ([place]) 03/12/2015  . Leukocytosis 03/12/2015  . Hypokalemia 03/12/2015  . Hypothyroidism 03/12/2015  . UTI (urinary tract infection) 03/12/2015  . Sepsis due to Gram negative bacteria ([place])   . Gram-negative bacteremia 03/11/2015  . Lap gastric bypass June 2016 11/25/2014  . Diabetes mellitus with neuropathy ([place]) 06/20/2013  . Chronic pain of right knee 06/20/2013  . Morbid obesity ([place]) 07/05/2012    Past Surgical History:  Procedure Laterality Date  . ABDOMINAL HYSTERECTOMY     partial  . APPENDECTOMY    . BREAST EXCISIONAL BIOPSY  Right   . BREAST LUMPECTOMY     right  . CARDIAC CATHETERIZATION     no significant CAD, nl LV function by 11/08/06 cath  . CESAREAN SECTION    . GASTRIC ROUX-EN-Y N/A 11/25/2014   Procedure: LAPAROSCOPIC ROUX-EN-Y GASTRIC BYPASS WITH UPPER ENDOSCOPY;  Surgeon: Johnathan Hausen, MD;  Location: [facility];  Service: General;  Laterality: N/A;  . HERNIA REPAIR    . JOINT REPLACEMENT    . KNEE SURGERY     right  . [place]  . [place]  . TOTAL KNEE REVISION  06/21/2012   Procedure: TOTAL KNEE REVISION;  Surgeon: Newt Minion, MD;  Location: [facility];  Service: Orthopedics;  Laterality: Right;  Revision Right Total Knee Arthroplasty    OB History    No data available       Home Medications    Prior to Admission medications   Medication Sig Start Date End Date Taking? Authorizing Provider  lurasidone (LATUDA) 40 MG TABS tablet Take 40 mg by mouth daily with breakfast.   Yes [provider]  ADVAIR DISKUS 500-50 MCG/DOSE AEPB Inhale 1 puff into the lungs 2 (two) times daily as needed (shortness of breath, wheezing).  12/05/13  [provider]  albuterol (PROVENTIL HFA;VENTOLIN HFA) 108 (90 BASE) MCG/ACT inhaler Inhale 2 puffs into the lungs every 6 (six) hours as needed for wheezing or shortness of breath. Shortness of breath    [provider]  ALPRAZolam (XANAX) 1 MG tablet Take 1 mg by mouth 4 (four) times daily.  07/02/14   [provider]  amLODipine (NORVASC) 5 MG tablet Take 5 mg by mouth daily. 06/15/16   [provider]  amphetamine-dextroamphetamine (ADDERALL XR) 30 MG 24 hr capsule Take 30 mg by mouth daily. 06/25/16   [provider]  amphetamine-dextroamphetamine (ADDERALL) 10 MG tablet Take 10 mg by mouth 2 (two) times daily. 06/25/16   [provider]  citalopram (CELEXA) 40 MG tablet Take 40 mg by mouth at bedtime. 06/24/16   [provider]  cyclobenzaprine  (FLEXERIL) 10 MG tablet Take 1 tablet (10 mg total) by mouth 2 (two) times daily as needed for muscle spasms (back pain). 12/21/15   Schorr, Rhetta Mura, NP  EPINEPHrine 0.3 mg/0.3 mL IJ SOAJ injection Inject 0.3 mg as directed as needed (severe allergic reaction).  01/11/14   [provider]  fluticasone (FLONASE) 50 MCG/ACT nasal spray Place 2 sprays into the nose daily as needed (congestion).    [provider]  gabapentin (NEURONTIN) 600 MG tablet Take 600 mg by mouth 2 (two) times daily.    [provider]  hydrochlorothiazide (MICROZIDE) 12.5 MG capsule Take 1 capsule (12.5 mg total) by mouth daily. 01/26/16 07/19/16  Belva Crome, MD  levocetirizine (XYZAL) 5 MG tablet Take 5 mg by mouth daily as needed for allergies.  06/29/14   [provider]  levothyroxine (SYNTHROID, LEVOTHROID) 112 MCG tablet Take 112 mcg by mouth daily. 08/09/16   [provider]  lidocaine (LIDODERM) 5 % Place 1 patch onto the skin daily. Remove & Discard patch within 12 hours or as directed by MD Patient not taking: Reported on 09/21/2016 09/07/15   Melony Overly, MD  morphine (MS CONTIN) 15 MG 12 hr tablet Take 1 tablet (15 mg total) by mouth every 12 (twelve) hours. 11/14/14   Bayard Hugger, NP  Multiple Vitamin (MULTIVITAMIN) tablet Take 1 tablet by mouth every morning.     [provider]  nabumetone (RELAFEN) 750 MG tablet TAKE 1 TABLET BY MOUTH TWICE DAILY AS NEEDED FOR INFLAMMATION/PAIN 09/14/16   Newt Minion, MD  nitroGLYCERIN (NITROSTAT) 0.4 MG SL tablet DISSOLVE 1 TABLET UNDER THE TONGUE EVERY 5 MINUTES AS NEEDED Patient taking differently: DISSOLVE 1 TABLET UNDER THE TONGUE EVERY 5 MINUTES AS NEEDED FOR CHEST PAIN 01/27/16   Belva Crome, MD  nitroGLYCERIN (NITROSTAT) 0.4 MG SL tablet Place 1 tablet (0.4 mg total) under the tongue every 5 (five) minutes as needed for chest pain. 10/21/16   Belva Crome, MD  Oxycodone HCl 10 MG TABS Take 1 tablet (10 mg total)  by mouth every 6 (six) hours as needed (for pain). 11/14/14   Bayard Hugger, NP  predniSONE (DELTASONE) 20 MG tablet Take 2 tablets (40 mg total) by mouth daily. 09/21/16   [name], MD  REXULTI 2 MG TABS Take 2 mg by mouth at bedtime.  06/18/14   [provider]  simvastatin (ZOCOR) 40 MG tablet Take 40 mg by mouth every evening. 06/17/16   [provider]  valsartan (DIOVAN) 160 MG tablet Take 160 mg by mouth daily. 06/17/16   [provider]  VOLTAREN 1 %  GEL Apply 1 application topically 4 (four) times daily as needed (pain).  01/23/15   [provider]  zolpidem (AMBIEN) 10 MG tablet Take 10 mg by mouth at bedtime.  09/07/14   [provider]    Family History Family History  Problem Relation Age of Onset  . Cancer Father        colon  . Stroke Father   . Heart disease Father   . Diabetes Father   . Hypertension Father   . Depression Sister   . Hypertension Mother     Social History Social History  Substance Use Topics  . Smoking status: Current Every Day Smoker    Packs/day: 0.50    Years: 4.00    Types: Cigarettes    Last attempt to quit: 09/13/2014  . Smokeless tobacco: Never Used  . Alcohol use No     Allergies   Aspirin; Sulfa antibiotics; and Other   Review of Systems Review of Systems  Musculoskeletal: Positive for back pain.  All other systems reviewed and are negative.    Physical Exam Triage Vital Signs ED Triage Vitals  Enc Vitals Group     BP 02/11/17 1734 (!) 148/74     Pulse Rate 02/11/17 1734 82     Resp 02/11/17 1734 19     Temp 02/11/17 1734 98.9 F (37.2 C)     Temp Source 02/11/17 1734 Oral     SpO2 02/11/17 1734 98 %     Weight --      Height --      Head Circumference --      Peak Flow --      Pain Score 02/11/17 1730 8     Pain Loc --      Pain Edu? --      Excl. in [place]? --    No data found.   Updated Vital Signs BP (!) 148/74 (BP Location: Left Arm)   Pulse 82   Temp 98.9 F  (37.2 C) (Oral)   Resp 19   SpO2 98%   Visual Acuity Right Eye Distance:   Left Eye Distance:   Bilateral Distance:    Right Eye Near:   Left Eye Near:    Bilateral Near:     Physical Exam  Constitutional: She appears well-developed and well-nourished.  HENT:  Head: Normocephalic.  Cardiovascular: Normal rate.   Pulmonary/Chest: Effort normal.  Abdominal: Soft.  Musculoskeletal: Normal range of motion.  Diffusely tender lower back  Neurological: She is alert.  Skin: Skin is warm.  Psychiatric: She has a normal mood and affect.  Nursing note and vitals reviewed.    UC Treatments / Results  Labs (all labs ordered are listed, but only abnormal results are displayed) Labs Reviewed - No data to display  EKG  EKG Interpretation None       Radiology No results found.  Procedures Procedures (including critical care time)  Medications Ordered in UC Medications  ketorolac (TORADOL) injection 60 mg (not administered)     Initial Impression / Assessment and Plan / UC Course  I have reviewed the triage vital signs and the nursing notes.  Pertinent labs & imaging results that were available during my care of the patient were reviewed by me and considered in my medical decision making (see chart for details).        Final Clinical Impressions(s) / UC Diagnoses   Final diagnoses:  Chronic right-sided low back pain with right-sided sciatica  New Prescriptions New Prescriptions   No medications on file    Meds ordered this encounter  Medications  . lurasidone (LATUDA) 40 MG TABS tablet    Sig: Take 40 mg by mouth daily with breakfast.  . ketorolac (TORADOL) injection 60 mg  . predniSONE (DELTASONE) 10 MG tablet    Sig: 6,5,4,3,2,1, taper    Dispense:  21 tablet    Refill:  0    Order Specific Question:   Supervising Provider    Answer:   Sherlene Shams [[id number]]   Controlled Substance Prescriptions [place] Controlled Substance Registry consulted? Not  Applicable   An After Visit Summary was printed and given to the patient.   Fransico Meadow, [place] 02/11/17 1759

## 2017-02-11 NOTE — ED Triage Notes (Signed)
Patient reports falling last august and had procedure last Thursday for back (states had nerves were burnt out). States that she is continuing to have pain despite the procedure and pain medication.

## 2017-02-14 DIAGNOSIS — G894 Chronic pain syndrome: Secondary | ICD-10-CM | POA: Diagnosis not present

## 2017-02-14 DIAGNOSIS — M179 Osteoarthritis of knee, unspecified: Secondary | ICD-10-CM | POA: Diagnosis not present

## 2017-02-15 DIAGNOSIS — G5791 Unspecified mononeuropathy of right lower limb: Secondary | ICD-10-CM | POA: Diagnosis not present

## 2017-02-15 DIAGNOSIS — Z720 Tobacco use: Secondary | ICD-10-CM | POA: Diagnosis not present

## 2017-02-15 DIAGNOSIS — Z23 Encounter for immunization: Secondary | ICD-10-CM | POA: Diagnosis not present

## 2017-02-15 DIAGNOSIS — M179 Osteoarthritis of knee, unspecified: Secondary | ICD-10-CM | POA: Diagnosis not present

## 2017-02-15 DIAGNOSIS — Z6839 Body mass index (BMI) 39.0-39.9, adult: Secondary | ICD-10-CM | POA: Diagnosis not present

## 2017-02-15 DIAGNOSIS — Z79899 Other long term (current) drug therapy: Secondary | ICD-10-CM | POA: Diagnosis not present

## 2017-02-15 DIAGNOSIS — Z5181 Encounter for therapeutic drug level monitoring: Secondary | ICD-10-CM | POA: Diagnosis not present

## 2017-02-15 DIAGNOSIS — E119 Type 2 diabetes mellitus without complications: Secondary | ICD-10-CM | POA: Diagnosis not present

## 2017-02-15 DIAGNOSIS — G894 Chronic pain syndrome: Secondary | ICD-10-CM | POA: Diagnosis not present

## 2017-02-15 DIAGNOSIS — E039 Hypothyroidism, unspecified: Secondary | ICD-10-CM | POA: Diagnosis not present

## 2017-02-15 DIAGNOSIS — Z9884 Bariatric surgery status: Secondary | ICD-10-CM | POA: Diagnosis not present

## 2017-02-15 DIAGNOSIS — Z8639 Personal history of other endocrine, nutritional and metabolic disease: Secondary | ICD-10-CM | POA: Diagnosis not present

## 2017-02-16 DIAGNOSIS — G894 Chronic pain syndrome: Secondary | ICD-10-CM | POA: Diagnosis not present

## 2017-02-16 DIAGNOSIS — M179 Osteoarthritis of knee, unspecified: Secondary | ICD-10-CM | POA: Diagnosis not present

## 2017-02-17 DIAGNOSIS — M179 Osteoarthritis of knee, unspecified: Secondary | ICD-10-CM | POA: Diagnosis not present

## 2017-02-17 DIAGNOSIS — G894 Chronic pain syndrome: Secondary | ICD-10-CM | POA: Diagnosis not present

## 2017-02-18 DIAGNOSIS — G894 Chronic pain syndrome: Secondary | ICD-10-CM | POA: Diagnosis not present

## 2017-02-18 DIAGNOSIS — M179 Osteoarthritis of knee, unspecified: Secondary | ICD-10-CM | POA: Diagnosis not present

## 2017-02-19 DIAGNOSIS — G894 Chronic pain syndrome: Secondary | ICD-10-CM | POA: Diagnosis not present

## 2017-02-19 DIAGNOSIS — M179 Osteoarthritis of knee, unspecified: Secondary | ICD-10-CM | POA: Diagnosis not present

## 2017-02-20 DIAGNOSIS — M179 Osteoarthritis of knee, unspecified: Secondary | ICD-10-CM | POA: Diagnosis not present

## 2017-02-20 DIAGNOSIS — G894 Chronic pain syndrome: Secondary | ICD-10-CM | POA: Diagnosis not present

## 2017-02-21 DIAGNOSIS — G894 Chronic pain syndrome: Secondary | ICD-10-CM | POA: Diagnosis not present

## 2017-02-21 DIAGNOSIS — M179 Osteoarthritis of knee, unspecified: Secondary | ICD-10-CM | POA: Diagnosis not present

## 2017-02-22 DIAGNOSIS — M179 Osteoarthritis of knee, unspecified: Secondary | ICD-10-CM | POA: Diagnosis not present

## 2017-02-22 DIAGNOSIS — G894 Chronic pain syndrome: Secondary | ICD-10-CM | POA: Diagnosis not present

## 2017-02-23 DIAGNOSIS — M179 Osteoarthritis of knee, unspecified: Secondary | ICD-10-CM | POA: Diagnosis not present

## 2017-02-23 DIAGNOSIS — F423 Hoarding disorder: Secondary | ICD-10-CM | POA: Diagnosis not present

## 2017-02-23 DIAGNOSIS — F250 Schizoaffective disorder, bipolar type: Secondary | ICD-10-CM | POA: Diagnosis not present

## 2017-02-23 DIAGNOSIS — G894 Chronic pain syndrome: Secondary | ICD-10-CM | POA: Diagnosis not present

## 2017-02-24 DIAGNOSIS — G894 Chronic pain syndrome: Secondary | ICD-10-CM | POA: Diagnosis not present

## 2017-02-24 DIAGNOSIS — M179 Osteoarthritis of knee, unspecified: Secondary | ICD-10-CM | POA: Diagnosis not present

## 2017-02-25 DIAGNOSIS — G894 Chronic pain syndrome: Secondary | ICD-10-CM | POA: Diagnosis not present

## 2017-02-25 DIAGNOSIS — M179 Osteoarthritis of knee, unspecified: Secondary | ICD-10-CM | POA: Diagnosis not present

## 2017-02-26 DIAGNOSIS — M179 Osteoarthritis of knee, unspecified: Secondary | ICD-10-CM | POA: Diagnosis not present

## 2017-02-26 DIAGNOSIS — G894 Chronic pain syndrome: Secondary | ICD-10-CM | POA: Diagnosis not present

## 2017-02-27 DIAGNOSIS — M179 Osteoarthritis of knee, unspecified: Secondary | ICD-10-CM | POA: Diagnosis not present

## 2017-02-27 DIAGNOSIS — G894 Chronic pain syndrome: Secondary | ICD-10-CM | POA: Diagnosis not present

## 2017-02-28 DIAGNOSIS — M179 Osteoarthritis of knee, unspecified: Secondary | ICD-10-CM | POA: Diagnosis not present

## 2017-02-28 DIAGNOSIS — G894 Chronic pain syndrome: Secondary | ICD-10-CM | POA: Diagnosis not present

## 2017-03-01 DIAGNOSIS — G894 Chronic pain syndrome: Secondary | ICD-10-CM | POA: Diagnosis not present

## 2017-03-01 DIAGNOSIS — M179 Osteoarthritis of knee, unspecified: Secondary | ICD-10-CM | POA: Diagnosis not present

## 2017-03-02 DIAGNOSIS — G894 Chronic pain syndrome: Secondary | ICD-10-CM | POA: Diagnosis not present

## 2017-03-02 DIAGNOSIS — M179 Osteoarthritis of knee, unspecified: Secondary | ICD-10-CM | POA: Diagnosis not present

## 2017-03-03 DIAGNOSIS — G894 Chronic pain syndrome: Secondary | ICD-10-CM | POA: Diagnosis not present

## 2017-03-03 DIAGNOSIS — M179 Osteoarthritis of knee, unspecified: Secondary | ICD-10-CM | POA: Diagnosis not present

## 2017-03-04 DIAGNOSIS — G894 Chronic pain syndrome: Secondary | ICD-10-CM | POA: Diagnosis not present

## 2017-03-04 DIAGNOSIS — M179 Osteoarthritis of knee, unspecified: Secondary | ICD-10-CM | POA: Diagnosis not present

## 2017-03-05 DIAGNOSIS — G894 Chronic pain syndrome: Secondary | ICD-10-CM | POA: Diagnosis not present

## 2017-03-05 DIAGNOSIS — M179 Osteoarthritis of knee, unspecified: Secondary | ICD-10-CM | POA: Diagnosis not present

## 2017-03-06 DIAGNOSIS — G894 Chronic pain syndrome: Secondary | ICD-10-CM | POA: Diagnosis not present

## 2017-03-06 DIAGNOSIS — M179 Osteoarthritis of knee, unspecified: Secondary | ICD-10-CM | POA: Diagnosis not present

## 2017-03-07 DIAGNOSIS — M179 Osteoarthritis of knee, unspecified: Secondary | ICD-10-CM | POA: Diagnosis not present

## 2017-03-07 DIAGNOSIS — G894 Chronic pain syndrome: Secondary | ICD-10-CM | POA: Diagnosis not present

## 2017-03-08 ENCOUNTER — Other Ambulatory Visit: Payer: Self-pay | Admitting: Interventional Cardiology

## 2017-03-08 DIAGNOSIS — I10 Essential (primary) hypertension: Secondary | ICD-10-CM

## 2017-03-08 DIAGNOSIS — M179 Osteoarthritis of knee, unspecified: Secondary | ICD-10-CM | POA: Diagnosis not present

## 2017-03-08 DIAGNOSIS — G894 Chronic pain syndrome: Secondary | ICD-10-CM | POA: Diagnosis not present

## 2017-03-09 ENCOUNTER — Other Ambulatory Visit: Payer: Self-pay | Admitting: Interventional Cardiology

## 2017-03-09 DIAGNOSIS — I10 Essential (primary) hypertension: Secondary | ICD-10-CM

## 2017-03-09 DIAGNOSIS — Z111 Encounter for screening for respiratory tuberculosis: Secondary | ICD-10-CM | POA: Diagnosis not present

## 2017-03-09 DIAGNOSIS — G4730 Sleep apnea, unspecified: Secondary | ICD-10-CM | POA: Diagnosis not present

## 2017-03-09 DIAGNOSIS — E785 Hyperlipidemia, unspecified: Secondary | ICD-10-CM | POA: Diagnosis not present

## 2017-03-09 DIAGNOSIS — E119 Type 2 diabetes mellitus without complications: Secondary | ICD-10-CM | POA: Diagnosis not present

## 2017-03-09 DIAGNOSIS — E039 Hypothyroidism, unspecified: Secondary | ICD-10-CM | POA: Diagnosis not present

## 2017-03-09 DIAGNOSIS — M179 Osteoarthritis of knee, unspecified: Secondary | ICD-10-CM | POA: Diagnosis not present

## 2017-03-09 DIAGNOSIS — Z23 Encounter for immunization: Secondary | ICD-10-CM | POA: Diagnosis not present

## 2017-03-09 DIAGNOSIS — G629 Polyneuropathy, unspecified: Secondary | ICD-10-CM | POA: Diagnosis not present

## 2017-03-09 DIAGNOSIS — Z0184 Encounter for antibody response examination: Secondary | ICD-10-CM | POA: Diagnosis not present

## 2017-03-09 DIAGNOSIS — F250 Schizoaffective disorder, bipolar type: Secondary | ICD-10-CM | POA: Diagnosis not present

## 2017-03-09 DIAGNOSIS — M6258 Muscle wasting and atrophy, not elsewhere classified, other site: Secondary | ICD-10-CM | POA: Diagnosis not present

## 2017-03-09 DIAGNOSIS — G894 Chronic pain syndrome: Secondary | ICD-10-CM | POA: Diagnosis not present

## 2017-03-09 NOTE — Telephone Encounter (Signed)
Please call office and schedule appointment for further refills. [phone] Thank you 2nd attempt

## 2017-03-10 DIAGNOSIS — G894 Chronic pain syndrome: Secondary | ICD-10-CM | POA: Diagnosis not present

## 2017-03-10 DIAGNOSIS — M179 Osteoarthritis of knee, unspecified: Secondary | ICD-10-CM | POA: Diagnosis not present

## 2017-03-11 DIAGNOSIS — M179 Osteoarthritis of knee, unspecified: Secondary | ICD-10-CM | POA: Diagnosis not present

## 2017-03-11 DIAGNOSIS — G894 Chronic pain syndrome: Secondary | ICD-10-CM | POA: Diagnosis not present

## 2017-03-12 DIAGNOSIS — G894 Chronic pain syndrome: Secondary | ICD-10-CM | POA: Diagnosis not present

## 2017-03-12 DIAGNOSIS — M179 Osteoarthritis of knee, unspecified: Secondary | ICD-10-CM | POA: Diagnosis not present

## 2017-03-13 DIAGNOSIS — M179 Osteoarthritis of knee, unspecified: Secondary | ICD-10-CM | POA: Diagnosis not present

## 2017-03-13 DIAGNOSIS — G894 Chronic pain syndrome: Secondary | ICD-10-CM | POA: Diagnosis not present

## 2017-03-14 DIAGNOSIS — M179 Osteoarthritis of knee, unspecified: Secondary | ICD-10-CM | POA: Diagnosis not present

## 2017-03-14 DIAGNOSIS — G894 Chronic pain syndrome: Secondary | ICD-10-CM | POA: Diagnosis not present

## 2017-03-14 DIAGNOSIS — F250 Schizoaffective disorder, bipolar type: Secondary | ICD-10-CM | POA: Diagnosis not present

## 2017-03-15 DIAGNOSIS — M25561 Pain in right knee: Secondary | ICD-10-CM | POA: Diagnosis not present

## 2017-03-15 DIAGNOSIS — G894 Chronic pain syndrome: Secondary | ICD-10-CM | POA: Diagnosis not present

## 2017-03-15 DIAGNOSIS — M179 Osteoarthritis of knee, unspecified: Secondary | ICD-10-CM | POA: Diagnosis not present

## 2017-03-16 DIAGNOSIS — G894 Chronic pain syndrome: Secondary | ICD-10-CM | POA: Diagnosis not present

## 2017-03-16 DIAGNOSIS — M179 Osteoarthritis of knee, unspecified: Secondary | ICD-10-CM | POA: Diagnosis not present

## 2017-03-17 DIAGNOSIS — E119 Type 2 diabetes mellitus without complications: Secondary | ICD-10-CM | POA: Diagnosis not present

## 2017-03-17 DIAGNOSIS — Z23 Encounter for immunization: Secondary | ICD-10-CM | POA: Diagnosis not present

## 2017-03-17 DIAGNOSIS — G894 Chronic pain syndrome: Secondary | ICD-10-CM | POA: Diagnosis not present

## 2017-03-17 DIAGNOSIS — M179 Osteoarthritis of knee, unspecified: Secondary | ICD-10-CM | POA: Diagnosis not present

## 2017-03-18 DIAGNOSIS — G894 Chronic pain syndrome: Secondary | ICD-10-CM | POA: Diagnosis not present

## 2017-03-18 DIAGNOSIS — M179 Osteoarthritis of knee, unspecified: Secondary | ICD-10-CM | POA: Diagnosis not present

## 2017-03-19 DIAGNOSIS — M179 Osteoarthritis of knee, unspecified: Secondary | ICD-10-CM | POA: Diagnosis not present

## 2017-03-19 DIAGNOSIS — G894 Chronic pain syndrome: Secondary | ICD-10-CM | POA: Diagnosis not present

## 2017-03-20 DIAGNOSIS — M179 Osteoarthritis of knee, unspecified: Secondary | ICD-10-CM | POA: Diagnosis not present

## 2017-03-20 DIAGNOSIS — G894 Chronic pain syndrome: Secondary | ICD-10-CM | POA: Diagnosis not present

## 2017-03-21 DIAGNOSIS — G894 Chronic pain syndrome: Secondary | ICD-10-CM | POA: Diagnosis not present

## 2017-03-21 DIAGNOSIS — M179 Osteoarthritis of knee, unspecified: Secondary | ICD-10-CM | POA: Diagnosis not present

## 2017-03-22 DIAGNOSIS — G894 Chronic pain syndrome: Secondary | ICD-10-CM | POA: Diagnosis not present

## 2017-03-22 DIAGNOSIS — M179 Osteoarthritis of knee, unspecified: Secondary | ICD-10-CM | POA: Diagnosis not present

## 2017-03-23 DIAGNOSIS — Z79899 Other long term (current) drug therapy: Secondary | ICD-10-CM | POA: Diagnosis not present

## 2017-03-23 DIAGNOSIS — G894 Chronic pain syndrome: Secondary | ICD-10-CM | POA: Diagnosis not present

## 2017-03-23 DIAGNOSIS — M179 Osteoarthritis of knee, unspecified: Secondary | ICD-10-CM | POA: Diagnosis not present

## 2017-03-24 DIAGNOSIS — G894 Chronic pain syndrome: Secondary | ICD-10-CM | POA: Diagnosis not present

## 2017-03-24 DIAGNOSIS — M179 Osteoarthritis of knee, unspecified: Secondary | ICD-10-CM | POA: Diagnosis not present

## 2017-03-25 DIAGNOSIS — G894 Chronic pain syndrome: Secondary | ICD-10-CM | POA: Diagnosis not present

## 2017-03-25 DIAGNOSIS — M179 Osteoarthritis of knee, unspecified: Secondary | ICD-10-CM | POA: Diagnosis not present

## 2017-03-26 DIAGNOSIS — G894 Chronic pain syndrome: Secondary | ICD-10-CM | POA: Diagnosis not present

## 2017-03-26 DIAGNOSIS — M179 Osteoarthritis of knee, unspecified: Secondary | ICD-10-CM | POA: Diagnosis not present

## 2017-03-27 DIAGNOSIS — G894 Chronic pain syndrome: Secondary | ICD-10-CM | POA: Diagnosis not present

## 2017-03-27 DIAGNOSIS — M179 Osteoarthritis of knee, unspecified: Secondary | ICD-10-CM | POA: Diagnosis not present

## 2017-03-28 DIAGNOSIS — G894 Chronic pain syndrome: Secondary | ICD-10-CM | POA: Diagnosis not present

## 2017-03-28 DIAGNOSIS — F250 Schizoaffective disorder, bipolar type: Secondary | ICD-10-CM | POA: Diagnosis not present

## 2017-03-28 DIAGNOSIS — F900 Attention-deficit hyperactivity disorder, predominantly inattentive type: Secondary | ICD-10-CM | POA: Diagnosis not present

## 2017-03-28 DIAGNOSIS — M179 Osteoarthritis of knee, unspecified: Secondary | ICD-10-CM | POA: Diagnosis not present

## 2017-03-28 DIAGNOSIS — F423 Hoarding disorder: Secondary | ICD-10-CM | POA: Diagnosis not present

## 2017-03-28 DIAGNOSIS — F4001 Agoraphobia with panic disorder: Secondary | ICD-10-CM | POA: Diagnosis not present

## 2017-03-29 DIAGNOSIS — M179 Osteoarthritis of knee, unspecified: Secondary | ICD-10-CM | POA: Diagnosis not present

## 2017-03-29 DIAGNOSIS — G894 Chronic pain syndrome: Secondary | ICD-10-CM | POA: Diagnosis not present

## 2017-03-30 DIAGNOSIS — M179 Osteoarthritis of knee, unspecified: Secondary | ICD-10-CM | POA: Diagnosis not present

## 2017-03-30 DIAGNOSIS — G894 Chronic pain syndrome: Secondary | ICD-10-CM | POA: Diagnosis not present

## 2017-03-31 DIAGNOSIS — M179 Osteoarthritis of knee, unspecified: Secondary | ICD-10-CM | POA: Diagnosis not present

## 2017-03-31 DIAGNOSIS — F250 Schizoaffective disorder, bipolar type: Secondary | ICD-10-CM | POA: Diagnosis not present

## 2017-03-31 DIAGNOSIS — G894 Chronic pain syndrome: Secondary | ICD-10-CM | POA: Diagnosis not present

## 2017-03-31 DIAGNOSIS — F4001 Agoraphobia with panic disorder: Secondary | ICD-10-CM | POA: Diagnosis not present

## 2017-04-01 DIAGNOSIS — M179 Osteoarthritis of knee, unspecified: Secondary | ICD-10-CM | POA: Diagnosis not present

## 2017-04-01 DIAGNOSIS — G894 Chronic pain syndrome: Secondary | ICD-10-CM | POA: Diagnosis not present

## 2017-04-02 DIAGNOSIS — M179 Osteoarthritis of knee, unspecified: Secondary | ICD-10-CM | POA: Diagnosis not present

## 2017-04-02 DIAGNOSIS — G894 Chronic pain syndrome: Secondary | ICD-10-CM | POA: Diagnosis not present

## 2017-04-03 DIAGNOSIS — G894 Chronic pain syndrome: Secondary | ICD-10-CM | POA: Diagnosis not present

## 2017-04-03 DIAGNOSIS — M179 Osteoarthritis of knee, unspecified: Secondary | ICD-10-CM | POA: Diagnosis not present

## 2017-04-04 DIAGNOSIS — M179 Osteoarthritis of knee, unspecified: Secondary | ICD-10-CM | POA: Diagnosis not present

## 2017-04-04 DIAGNOSIS — G894 Chronic pain syndrome: Secondary | ICD-10-CM | POA: Diagnosis not present

## 2017-04-05 DIAGNOSIS — M179 Osteoarthritis of knee, unspecified: Secondary | ICD-10-CM | POA: Diagnosis not present

## 2017-04-05 DIAGNOSIS — G894 Chronic pain syndrome: Secondary | ICD-10-CM | POA: Diagnosis not present

## 2017-04-06 DIAGNOSIS — F4001 Agoraphobia with panic disorder: Secondary | ICD-10-CM | POA: Diagnosis not present

## 2017-04-06 DIAGNOSIS — F250 Schizoaffective disorder, bipolar type: Secondary | ICD-10-CM | POA: Diagnosis not present

## 2017-04-06 DIAGNOSIS — G894 Chronic pain syndrome: Secondary | ICD-10-CM | POA: Diagnosis not present

## 2017-04-06 DIAGNOSIS — M179 Osteoarthritis of knee, unspecified: Secondary | ICD-10-CM | POA: Diagnosis not present

## 2017-04-07 DIAGNOSIS — G894 Chronic pain syndrome: Secondary | ICD-10-CM | POA: Diagnosis not present

## 2017-04-07 DIAGNOSIS — M179 Osteoarthritis of knee, unspecified: Secondary | ICD-10-CM | POA: Diagnosis not present

## 2017-04-08 DIAGNOSIS — M179 Osteoarthritis of knee, unspecified: Secondary | ICD-10-CM | POA: Diagnosis not present

## 2017-04-08 DIAGNOSIS — G894 Chronic pain syndrome: Secondary | ICD-10-CM | POA: Diagnosis not present

## 2017-04-09 DIAGNOSIS — M179 Osteoarthritis of knee, unspecified: Secondary | ICD-10-CM | POA: Diagnosis not present

## 2017-04-09 DIAGNOSIS — G894 Chronic pain syndrome: Secondary | ICD-10-CM | POA: Diagnosis not present

## 2017-04-10 DIAGNOSIS — M179 Osteoarthritis of knee, unspecified: Secondary | ICD-10-CM | POA: Diagnosis not present

## 2017-04-10 DIAGNOSIS — G894 Chronic pain syndrome: Secondary | ICD-10-CM | POA: Diagnosis not present

## 2017-04-12 DIAGNOSIS — F4001 Agoraphobia with panic disorder: Secondary | ICD-10-CM | POA: Diagnosis not present

## 2017-04-12 DIAGNOSIS — F250 Schizoaffective disorder, bipolar type: Secondary | ICD-10-CM | POA: Diagnosis not present

## 2017-04-14 DIAGNOSIS — G894 Chronic pain syndrome: Secondary | ICD-10-CM | POA: Diagnosis not present

## 2017-04-14 DIAGNOSIS — M25561 Pain in right knee: Secondary | ICD-10-CM | POA: Diagnosis not present

## 2017-04-14 DIAGNOSIS — M179 Osteoarthritis of knee, unspecified: Secondary | ICD-10-CM | POA: Diagnosis not present

## 2017-04-15 DIAGNOSIS — M179 Osteoarthritis of knee, unspecified: Secondary | ICD-10-CM | POA: Diagnosis not present

## 2017-04-15 DIAGNOSIS — G894 Chronic pain syndrome: Secondary | ICD-10-CM | POA: Diagnosis not present

## 2017-04-16 DIAGNOSIS — G894 Chronic pain syndrome: Secondary | ICD-10-CM | POA: Diagnosis not present

## 2017-04-16 DIAGNOSIS — M179 Osteoarthritis of knee, unspecified: Secondary | ICD-10-CM | POA: Diagnosis not present

## 2017-04-17 DIAGNOSIS — G894 Chronic pain syndrome: Secondary | ICD-10-CM | POA: Diagnosis not present

## 2017-04-17 DIAGNOSIS — M179 Osteoarthritis of knee, unspecified: Secondary | ICD-10-CM | POA: Diagnosis not present

## 2017-04-18 DIAGNOSIS — M179 Osteoarthritis of knee, unspecified: Secondary | ICD-10-CM | POA: Diagnosis not present

## 2017-04-18 DIAGNOSIS — G894 Chronic pain syndrome: Secondary | ICD-10-CM | POA: Diagnosis not present

## 2017-04-19 DIAGNOSIS — M179 Osteoarthritis of knee, unspecified: Secondary | ICD-10-CM | POA: Diagnosis not present

## 2017-04-19 DIAGNOSIS — G894 Chronic pain syndrome: Secondary | ICD-10-CM | POA: Diagnosis not present

## 2017-04-20 DIAGNOSIS — M179 Osteoarthritis of knee, unspecified: Secondary | ICD-10-CM | POA: Diagnosis not present

## 2017-04-20 DIAGNOSIS — G894 Chronic pain syndrome: Secondary | ICD-10-CM | POA: Diagnosis not present

## 2017-04-20 DIAGNOSIS — F4001 Agoraphobia with panic disorder: Secondary | ICD-10-CM | POA: Diagnosis not present

## 2017-04-20 DIAGNOSIS — F250 Schizoaffective disorder, bipolar type: Secondary | ICD-10-CM | POA: Diagnosis not present

## 2017-04-21 DIAGNOSIS — M179 Osteoarthritis of knee, unspecified: Secondary | ICD-10-CM | POA: Diagnosis not present

## 2017-04-21 DIAGNOSIS — G894 Chronic pain syndrome: Secondary | ICD-10-CM | POA: Diagnosis not present

## 2017-04-22 DIAGNOSIS — M179 Osteoarthritis of knee, unspecified: Secondary | ICD-10-CM | POA: Diagnosis not present

## 2017-04-22 DIAGNOSIS — G894 Chronic pain syndrome: Secondary | ICD-10-CM | POA: Diagnosis not present

## 2017-04-23 DIAGNOSIS — G894 Chronic pain syndrome: Secondary | ICD-10-CM | POA: Diagnosis not present

## 2017-04-23 DIAGNOSIS — M179 Osteoarthritis of knee, unspecified: Secondary | ICD-10-CM | POA: Diagnosis not present

## 2017-04-24 DIAGNOSIS — G894 Chronic pain syndrome: Secondary | ICD-10-CM | POA: Diagnosis not present

## 2017-04-24 DIAGNOSIS — M179 Osteoarthritis of knee, unspecified: Secondary | ICD-10-CM | POA: Diagnosis not present

## 2017-04-25 DIAGNOSIS — G894 Chronic pain syndrome: Secondary | ICD-10-CM | POA: Diagnosis not present

## 2017-04-25 DIAGNOSIS — M179 Osteoarthritis of knee, unspecified: Secondary | ICD-10-CM | POA: Diagnosis not present

## 2017-04-26 DIAGNOSIS — G894 Chronic pain syndrome: Secondary | ICD-10-CM | POA: Diagnosis not present

## 2017-04-26 DIAGNOSIS — M179 Osteoarthritis of knee, unspecified: Secondary | ICD-10-CM | POA: Diagnosis not present

## 2017-04-27 DIAGNOSIS — G894 Chronic pain syndrome: Secondary | ICD-10-CM | POA: Diagnosis not present

## 2017-04-27 DIAGNOSIS — M179 Osteoarthritis of knee, unspecified: Secondary | ICD-10-CM | POA: Diagnosis not present

## 2017-04-28 DIAGNOSIS — M179 Osteoarthritis of knee, unspecified: Secondary | ICD-10-CM | POA: Diagnosis not present

## 2017-04-28 DIAGNOSIS — G894 Chronic pain syndrome: Secondary | ICD-10-CM | POA: Diagnosis not present

## 2017-04-29 DIAGNOSIS — G894 Chronic pain syndrome: Secondary | ICD-10-CM | POA: Diagnosis not present

## 2017-04-29 DIAGNOSIS — M179 Osteoarthritis of knee, unspecified: Secondary | ICD-10-CM | POA: Diagnosis not present

## 2017-04-30 DIAGNOSIS — M179 Osteoarthritis of knee, unspecified: Secondary | ICD-10-CM | POA: Diagnosis not present

## 2017-04-30 DIAGNOSIS — G894 Chronic pain syndrome: Secondary | ICD-10-CM | POA: Diagnosis not present

## 2017-05-01 DIAGNOSIS — G894 Chronic pain syndrome: Secondary | ICD-10-CM | POA: Diagnosis not present

## 2017-05-01 DIAGNOSIS — M179 Osteoarthritis of knee, unspecified: Secondary | ICD-10-CM | POA: Diagnosis not present

## 2017-05-02 DIAGNOSIS — M179 Osteoarthritis of knee, unspecified: Secondary | ICD-10-CM | POA: Diagnosis not present

## 2017-05-02 DIAGNOSIS — G894 Chronic pain syndrome: Secondary | ICD-10-CM | POA: Diagnosis not present

## 2017-05-03 DIAGNOSIS — G894 Chronic pain syndrome: Secondary | ICD-10-CM | POA: Diagnosis not present

## 2017-05-03 DIAGNOSIS — M179 Osteoarthritis of knee, unspecified: Secondary | ICD-10-CM | POA: Diagnosis not present

## 2017-05-04 DIAGNOSIS — G894 Chronic pain syndrome: Secondary | ICD-10-CM | POA: Diagnosis not present

## 2017-05-04 DIAGNOSIS — M179 Osteoarthritis of knee, unspecified: Secondary | ICD-10-CM | POA: Diagnosis not present

## 2017-05-05 DIAGNOSIS — M179 Osteoarthritis of knee, unspecified: Secondary | ICD-10-CM | POA: Diagnosis not present

## 2017-05-05 DIAGNOSIS — G894 Chronic pain syndrome: Secondary | ICD-10-CM | POA: Diagnosis not present

## 2017-05-06 DIAGNOSIS — G894 Chronic pain syndrome: Secondary | ICD-10-CM | POA: Diagnosis not present

## 2017-05-06 DIAGNOSIS — M179 Osteoarthritis of knee, unspecified: Secondary | ICD-10-CM | POA: Diagnosis not present

## 2017-05-07 DIAGNOSIS — G894 Chronic pain syndrome: Secondary | ICD-10-CM | POA: Diagnosis not present

## 2017-05-07 DIAGNOSIS — M179 Osteoarthritis of knee, unspecified: Secondary | ICD-10-CM | POA: Diagnosis not present

## 2017-05-08 DIAGNOSIS — M179 Osteoarthritis of knee, unspecified: Secondary | ICD-10-CM | POA: Diagnosis not present

## 2017-05-08 DIAGNOSIS — G894 Chronic pain syndrome: Secondary | ICD-10-CM | POA: Diagnosis not present

## 2017-05-09 DIAGNOSIS — G894 Chronic pain syndrome: Secondary | ICD-10-CM | POA: Diagnosis not present

## 2017-05-09 DIAGNOSIS — M179 Osteoarthritis of knee, unspecified: Secondary | ICD-10-CM | POA: Diagnosis not present

## 2017-05-10 DIAGNOSIS — M179 Osteoarthritis of knee, unspecified: Secondary | ICD-10-CM | POA: Diagnosis not present

## 2017-05-10 DIAGNOSIS — G894 Chronic pain syndrome: Secondary | ICD-10-CM | POA: Diagnosis not present

## 2017-05-11 ENCOUNTER — Other Ambulatory Visit: Payer: Medicare HMO

## 2017-05-11 ENCOUNTER — Other Ambulatory Visit: Payer: Self-pay | Admitting: Diagnostic Radiology

## 2017-05-11 ENCOUNTER — Ambulatory Visit
Admission: RE | Admit: 2017-05-11 | Discharge: 2017-05-11 | Disposition: A | Discharge status: Home / Self Care | Payer: Medicare HMO | Source: Ambulatory Visit | Attending: Family Medicine | Admitting: Family Medicine

## 2017-05-11 ENCOUNTER — Other Ambulatory Visit: Payer: Self-pay | Admitting: Family Medicine

## 2017-05-11 DIAGNOSIS — N611 Abscess of the breast and nipple: Secondary | ICD-10-CM

## 2017-05-11 DIAGNOSIS — M179 Osteoarthritis of knee, unspecified: Secondary | ICD-10-CM | POA: Diagnosis not present

## 2017-05-11 DIAGNOSIS — G894 Chronic pain syndrome: Secondary | ICD-10-CM | POA: Diagnosis not present

## 2017-05-12 DIAGNOSIS — G894 Chronic pain syndrome: Secondary | ICD-10-CM | POA: Diagnosis not present

## 2017-05-12 DIAGNOSIS — M179 Osteoarthritis of knee, unspecified: Secondary | ICD-10-CM | POA: Diagnosis not present

## 2017-05-13 DIAGNOSIS — G894 Chronic pain syndrome: Secondary | ICD-10-CM | POA: Diagnosis not present

## 2017-05-13 DIAGNOSIS — M179 Osteoarthritis of knee, unspecified: Secondary | ICD-10-CM | POA: Diagnosis not present

## 2017-05-13 DIAGNOSIS — F250 Schizoaffective disorder, bipolar type: Secondary | ICD-10-CM | POA: Diagnosis not present

## 2017-05-13 DIAGNOSIS — F4001 Agoraphobia with panic disorder: Secondary | ICD-10-CM | POA: Diagnosis not present

## 2017-05-14 DIAGNOSIS — M179 Osteoarthritis of knee, unspecified: Secondary | ICD-10-CM | POA: Diagnosis not present

## 2017-05-14 DIAGNOSIS — G894 Chronic pain syndrome: Secondary | ICD-10-CM | POA: Diagnosis not present

## 2017-05-15 DIAGNOSIS — G894 Chronic pain syndrome: Secondary | ICD-10-CM | POA: Diagnosis not present

## 2017-05-15 DIAGNOSIS — M179 Osteoarthritis of knee, unspecified: Secondary | ICD-10-CM | POA: Diagnosis not present

## 2017-05-15 LAB — AEROBIC/ANAEROBIC CULTURE W GRAM STAIN (SURGICAL/DEEP WOUND)

## 2017-05-15 LAB — AEROBIC/ANAEROBIC CULTURE (SURGICAL/DEEP WOUND): Special Requests: NORMAL

## 2017-05-16 ENCOUNTER — Other Ambulatory Visit: Payer: Self-pay | Admitting: Nurse Practitioner

## 2017-05-16 DIAGNOSIS — M179 Osteoarthritis of knee, unspecified: Secondary | ICD-10-CM | POA: Diagnosis not present

## 2017-05-16 DIAGNOSIS — G894 Chronic pain syndrome: Secondary | ICD-10-CM | POA: Diagnosis not present

## 2017-05-16 DIAGNOSIS — N611 Abscess of the breast and nipple: Secondary | ICD-10-CM

## 2017-05-17 ENCOUNTER — Ambulatory Visit
Admission: RE | Admit: 2017-05-17 | Discharge: 2017-05-17 | Disposition: A | Discharge status: Home / Self Care | Payer: Medicare HMO | Source: Ambulatory Visit | Attending: Nurse Practitioner | Admitting: Nurse Practitioner

## 2017-05-17 ENCOUNTER — Other Ambulatory Visit: Payer: Medicare HMO

## 2017-05-17 DIAGNOSIS — F250 Schizoaffective disorder, bipolar type: Secondary | ICD-10-CM | POA: Diagnosis not present

## 2017-05-17 DIAGNOSIS — F4001 Agoraphobia with panic disorder: Secondary | ICD-10-CM | POA: Diagnosis not present

## 2017-05-17 DIAGNOSIS — N611 Abscess of the breast and nipple: Secondary | ICD-10-CM | POA: Diagnosis not present

## 2017-05-17 DIAGNOSIS — N6489 Other specified disorders of breast: Secondary | ICD-10-CM | POA: Diagnosis not present

## 2017-05-17 DIAGNOSIS — G894 Chronic pain syndrome: Secondary | ICD-10-CM | POA: Diagnosis not present

## 2017-05-17 DIAGNOSIS — M179 Osteoarthritis of knee, unspecified: Secondary | ICD-10-CM | POA: Diagnosis not present

## 2017-05-18 DIAGNOSIS — G894 Chronic pain syndrome: Secondary | ICD-10-CM | POA: Diagnosis not present

## 2017-05-18 DIAGNOSIS — M179 Osteoarthritis of knee, unspecified: Secondary | ICD-10-CM | POA: Diagnosis not present

## 2017-05-18 DIAGNOSIS — M7051 Other bursitis of knee, right knee: Secondary | ICD-10-CM | POA: Diagnosis not present

## 2017-05-18 DIAGNOSIS — Z96651 Presence of right artificial knee joint: Secondary | ICD-10-CM | POA: Diagnosis not present

## 2017-05-18 DIAGNOSIS — M1712 Unilateral primary osteoarthritis, left knee: Secondary | ICD-10-CM | POA: Diagnosis not present

## 2017-05-19 DIAGNOSIS — E119 Type 2 diabetes mellitus without complications: Secondary | ICD-10-CM | POA: Diagnosis not present

## 2017-05-19 DIAGNOSIS — Z9884 Bariatric surgery status: Secondary | ICD-10-CM | POA: Diagnosis not present

## 2017-05-19 DIAGNOSIS — G894 Chronic pain syndrome: Secondary | ICD-10-CM | POA: Diagnosis not present

## 2017-05-19 DIAGNOSIS — M179 Osteoarthritis of knee, unspecified: Secondary | ICD-10-CM | POA: Diagnosis not present

## 2017-05-19 DIAGNOSIS — Z6841 Body Mass Index (BMI) 40.0 and over, adult: Secondary | ICD-10-CM | POA: Diagnosis not present

## 2017-05-19 DIAGNOSIS — Z5181 Encounter for therapeutic drug level monitoring: Secondary | ICD-10-CM | POA: Diagnosis not present

## 2017-05-19 DIAGNOSIS — E039 Hypothyroidism, unspecified: Secondary | ICD-10-CM | POA: Diagnosis not present

## 2017-05-20 DIAGNOSIS — G894 Chronic pain syndrome: Secondary | ICD-10-CM | POA: Diagnosis not present

## 2017-05-20 DIAGNOSIS — M179 Osteoarthritis of knee, unspecified: Secondary | ICD-10-CM | POA: Diagnosis not present

## 2017-05-21 DIAGNOSIS — G894 Chronic pain syndrome: Secondary | ICD-10-CM | POA: Diagnosis not present

## 2017-05-21 DIAGNOSIS — M179 Osteoarthritis of knee, unspecified: Secondary | ICD-10-CM | POA: Diagnosis not present

## 2017-05-22 DIAGNOSIS — G894 Chronic pain syndrome: Secondary | ICD-10-CM | POA: Diagnosis not present

## 2017-05-22 DIAGNOSIS — M179 Osteoarthritis of knee, unspecified: Secondary | ICD-10-CM | POA: Diagnosis not present

## 2017-05-23 DIAGNOSIS — G894 Chronic pain syndrome: Secondary | ICD-10-CM | POA: Diagnosis not present

## 2017-05-23 DIAGNOSIS — M179 Osteoarthritis of knee, unspecified: Secondary | ICD-10-CM | POA: Diagnosis not present

## 2017-05-24 DIAGNOSIS — M179 Osteoarthritis of knee, unspecified: Secondary | ICD-10-CM | POA: Diagnosis not present

## 2017-05-24 DIAGNOSIS — G894 Chronic pain syndrome: Secondary | ICD-10-CM | POA: Diagnosis not present

## 2017-05-25 DIAGNOSIS — M179 Osteoarthritis of knee, unspecified: Secondary | ICD-10-CM | POA: Diagnosis not present

## 2017-05-25 DIAGNOSIS — M25561 Pain in right knee: Secondary | ICD-10-CM | POA: Diagnosis not present

## 2017-05-25 DIAGNOSIS — G894 Chronic pain syndrome: Secondary | ICD-10-CM | POA: Diagnosis not present

## 2017-05-26 ENCOUNTER — Other Ambulatory Visit (HOSPITAL_COMMUNITY): Payer: Self-pay | Admitting: Orthopedic Surgery

## 2017-05-26 DIAGNOSIS — M179 Osteoarthritis of knee, unspecified: Secondary | ICD-10-CM | POA: Diagnosis not present

## 2017-05-26 DIAGNOSIS — G894 Chronic pain syndrome: Secondary | ICD-10-CM | POA: Diagnosis not present

## 2017-05-26 DIAGNOSIS — M7051 Other bursitis of knee, right knee: Secondary | ICD-10-CM

## 2017-05-26 DIAGNOSIS — M7052 Other bursitis of knee, left knee: Secondary | ICD-10-CM

## 2017-05-27 DIAGNOSIS — G894 Chronic pain syndrome: Secondary | ICD-10-CM | POA: Diagnosis not present

## 2017-05-27 DIAGNOSIS — M179 Osteoarthritis of knee, unspecified: Secondary | ICD-10-CM | POA: Diagnosis not present

## 2017-05-28 DIAGNOSIS — M179 Osteoarthritis of knee, unspecified: Secondary | ICD-10-CM | POA: Diagnosis not present

## 2017-05-28 DIAGNOSIS — G894 Chronic pain syndrome: Secondary | ICD-10-CM | POA: Diagnosis not present

## 2017-05-29 DIAGNOSIS — G894 Chronic pain syndrome: Secondary | ICD-10-CM | POA: Diagnosis not present

## 2017-05-29 DIAGNOSIS — M179 Osteoarthritis of knee, unspecified: Secondary | ICD-10-CM | POA: Diagnosis not present

## 2017-05-30 DIAGNOSIS — G894 Chronic pain syndrome: Secondary | ICD-10-CM | POA: Diagnosis not present

## 2017-05-30 DIAGNOSIS — M179 Osteoarthritis of knee, unspecified: Secondary | ICD-10-CM | POA: Diagnosis not present

## 2017-05-31 DIAGNOSIS — M179 Osteoarthritis of knee, unspecified: Secondary | ICD-10-CM | POA: Diagnosis not present

## 2017-05-31 DIAGNOSIS — G894 Chronic pain syndrome: Secondary | ICD-10-CM | POA: Diagnosis not present

## 2017-06-01 DIAGNOSIS — G894 Chronic pain syndrome: Secondary | ICD-10-CM | POA: Diagnosis not present

## 2017-06-01 DIAGNOSIS — M179 Osteoarthritis of knee, unspecified: Secondary | ICD-10-CM | POA: Diagnosis not present

## 2017-06-02 DIAGNOSIS — F4001 Agoraphobia with panic disorder: Secondary | ICD-10-CM | POA: Diagnosis not present

## 2017-06-02 DIAGNOSIS — M179 Osteoarthritis of knee, unspecified: Secondary | ICD-10-CM | POA: Diagnosis not present

## 2017-06-02 DIAGNOSIS — F250 Schizoaffective disorder, bipolar type: Secondary | ICD-10-CM | POA: Diagnosis not present

## 2017-06-02 DIAGNOSIS — G894 Chronic pain syndrome: Secondary | ICD-10-CM | POA: Diagnosis not present

## 2017-06-03 ENCOUNTER — Encounter (HOSPITAL_COMMUNITY): Payer: Self-pay

## 2017-06-03 ENCOUNTER — Ambulatory Visit (HOSPITAL_COMMUNITY): Payer: Medicare HMO

## 2017-06-03 ENCOUNTER — Ambulatory Visit (HOSPITAL_COMMUNITY): Payer: Medicare HMO | Attending: Orthopedic Surgery

## 2017-06-03 DIAGNOSIS — M179 Osteoarthritis of knee, unspecified: Secondary | ICD-10-CM | POA: Diagnosis not present

## 2017-06-03 DIAGNOSIS — G894 Chronic pain syndrome: Secondary | ICD-10-CM | POA: Diagnosis not present

## 2017-06-04 DIAGNOSIS — M179 Osteoarthritis of knee, unspecified: Secondary | ICD-10-CM | POA: Diagnosis not present

## 2017-06-04 DIAGNOSIS — G894 Chronic pain syndrome: Secondary | ICD-10-CM | POA: Diagnosis not present

## 2017-06-05 DIAGNOSIS — G894 Chronic pain syndrome: Secondary | ICD-10-CM | POA: Diagnosis not present

## 2017-06-05 DIAGNOSIS — M179 Osteoarthritis of knee, unspecified: Secondary | ICD-10-CM | POA: Diagnosis not present

## 2017-06-06 DIAGNOSIS — M179 Osteoarthritis of knee, unspecified: Secondary | ICD-10-CM | POA: Diagnosis not present

## 2017-06-06 DIAGNOSIS — G894 Chronic pain syndrome: Secondary | ICD-10-CM | POA: Diagnosis not present

## 2017-06-07 DIAGNOSIS — M179 Osteoarthritis of knee, unspecified: Secondary | ICD-10-CM | POA: Diagnosis not present

## 2017-06-07 DIAGNOSIS — G894 Chronic pain syndrome: Secondary | ICD-10-CM | POA: Diagnosis not present

## 2017-06-08 ENCOUNTER — Other Ambulatory Visit: Payer: Self-pay | Admitting: Interventional Cardiology

## 2017-06-08 DIAGNOSIS — R079 Chest pain, unspecified: Secondary | ICD-10-CM

## 2017-06-08 DIAGNOSIS — I10 Essential (primary) hypertension: Secondary | ICD-10-CM

## 2017-06-08 DIAGNOSIS — G894 Chronic pain syndrome: Secondary | ICD-10-CM | POA: Diagnosis not present

## 2017-06-08 DIAGNOSIS — M179 Osteoarthritis of knee, unspecified: Secondary | ICD-10-CM | POA: Diagnosis not present

## 2017-06-09 DIAGNOSIS — M179 Osteoarthritis of knee, unspecified: Secondary | ICD-10-CM | POA: Diagnosis not present

## 2017-06-09 DIAGNOSIS — G894 Chronic pain syndrome: Secondary | ICD-10-CM | POA: Diagnosis not present

## 2017-06-10 DIAGNOSIS — G894 Chronic pain syndrome: Secondary | ICD-10-CM | POA: Diagnosis not present

## 2017-06-10 DIAGNOSIS — M179 Osteoarthritis of knee, unspecified: Secondary | ICD-10-CM | POA: Diagnosis not present

## 2017-06-11 DIAGNOSIS — G894 Chronic pain syndrome: Secondary | ICD-10-CM | POA: Diagnosis not present

## 2017-06-11 DIAGNOSIS — M179 Osteoarthritis of knee, unspecified: Secondary | ICD-10-CM | POA: Diagnosis not present

## 2017-06-12 DIAGNOSIS — G894 Chronic pain syndrome: Secondary | ICD-10-CM | POA: Diagnosis not present

## 2017-06-12 DIAGNOSIS — M179 Osteoarthritis of knee, unspecified: Secondary | ICD-10-CM | POA: Diagnosis not present

## 2017-06-13 DIAGNOSIS — M179 Osteoarthritis of knee, unspecified: Secondary | ICD-10-CM | POA: Diagnosis not present

## 2017-06-13 DIAGNOSIS — G894 Chronic pain syndrome: Secondary | ICD-10-CM | POA: Diagnosis not present

## 2017-06-14 DIAGNOSIS — G894 Chronic pain syndrome: Secondary | ICD-10-CM | POA: Diagnosis not present

## 2017-06-14 DIAGNOSIS — M179 Osteoarthritis of knee, unspecified: Secondary | ICD-10-CM | POA: Diagnosis not present

## 2017-06-15 DIAGNOSIS — G894 Chronic pain syndrome: Secondary | ICD-10-CM | POA: Diagnosis not present

## 2017-06-15 DIAGNOSIS — M179 Osteoarthritis of knee, unspecified: Secondary | ICD-10-CM | POA: Diagnosis not present

## 2017-06-15 DIAGNOSIS — F4001 Agoraphobia with panic disorder: Secondary | ICD-10-CM | POA: Diagnosis not present

## 2017-06-15 DIAGNOSIS — F250 Schizoaffective disorder, bipolar type: Secondary | ICD-10-CM | POA: Diagnosis not present

## 2017-06-16 DIAGNOSIS — M179 Osteoarthritis of knee, unspecified: Secondary | ICD-10-CM | POA: Diagnosis not present

## 2017-06-16 DIAGNOSIS — G894 Chronic pain syndrome: Secondary | ICD-10-CM | POA: Diagnosis not present

## 2017-06-17 DIAGNOSIS — G894 Chronic pain syndrome: Secondary | ICD-10-CM | POA: Diagnosis not present

## 2017-06-17 DIAGNOSIS — M179 Osteoarthritis of knee, unspecified: Secondary | ICD-10-CM | POA: Diagnosis not present

## 2017-06-18 DIAGNOSIS — M179 Osteoarthritis of knee, unspecified: Secondary | ICD-10-CM | POA: Diagnosis not present

## 2017-06-18 DIAGNOSIS — G894 Chronic pain syndrome: Secondary | ICD-10-CM | POA: Diagnosis not present

## 2017-06-19 DIAGNOSIS — G894 Chronic pain syndrome: Secondary | ICD-10-CM | POA: Diagnosis not present

## 2017-06-19 DIAGNOSIS — M179 Osteoarthritis of knee, unspecified: Secondary | ICD-10-CM | POA: Diagnosis not present

## 2017-06-20 DIAGNOSIS — G894 Chronic pain syndrome: Secondary | ICD-10-CM | POA: Diagnosis not present

## 2017-06-20 DIAGNOSIS — M179 Osteoarthritis of knee, unspecified: Secondary | ICD-10-CM | POA: Diagnosis not present

## 2017-06-21 DIAGNOSIS — M179 Osteoarthritis of knee, unspecified: Secondary | ICD-10-CM | POA: Diagnosis not present

## 2017-06-21 DIAGNOSIS — G894 Chronic pain syndrome: Secondary | ICD-10-CM | POA: Diagnosis not present

## 2017-06-22 DIAGNOSIS — G894 Chronic pain syndrome: Secondary | ICD-10-CM | POA: Diagnosis not present

## 2017-06-22 DIAGNOSIS — M179 Osteoarthritis of knee, unspecified: Secondary | ICD-10-CM | POA: Diagnosis not present

## 2017-06-23 DIAGNOSIS — G894 Chronic pain syndrome: Secondary | ICD-10-CM | POA: Diagnosis not present

## 2017-06-23 DIAGNOSIS — M179 Osteoarthritis of knee, unspecified: Secondary | ICD-10-CM | POA: Diagnosis not present

## 2017-06-24 DIAGNOSIS — G894 Chronic pain syndrome: Secondary | ICD-10-CM | POA: Diagnosis not present

## 2017-06-24 DIAGNOSIS — M179 Osteoarthritis of knee, unspecified: Secondary | ICD-10-CM | POA: Diagnosis not present

## 2017-06-25 DIAGNOSIS — M179 Osteoarthritis of knee, unspecified: Secondary | ICD-10-CM | POA: Diagnosis not present

## 2017-06-25 DIAGNOSIS — G894 Chronic pain syndrome: Secondary | ICD-10-CM | POA: Diagnosis not present

## 2017-06-26 DIAGNOSIS — M179 Osteoarthritis of knee, unspecified: Secondary | ICD-10-CM | POA: Diagnosis not present

## 2017-06-26 DIAGNOSIS — G894 Chronic pain syndrome: Secondary | ICD-10-CM | POA: Diagnosis not present

## 2017-06-27 DIAGNOSIS — M179 Osteoarthritis of knee, unspecified: Secondary | ICD-10-CM | POA: Diagnosis not present

## 2017-06-27 DIAGNOSIS — G894 Chronic pain syndrome: Secondary | ICD-10-CM | POA: Diagnosis not present

## 2017-06-28 ENCOUNTER — Encounter (HOSPITAL_COMMUNITY): Payer: Self-pay

## 2017-06-28 DIAGNOSIS — M179 Osteoarthritis of knee, unspecified: Secondary | ICD-10-CM | POA: Diagnosis not present

## 2017-06-28 DIAGNOSIS — M25561 Pain in right knee: Secondary | ICD-10-CM | POA: Diagnosis not present

## 2017-06-28 DIAGNOSIS — G894 Chronic pain syndrome: Secondary | ICD-10-CM | POA: Diagnosis not present

## 2017-06-29 DIAGNOSIS — G894 Chronic pain syndrome: Secondary | ICD-10-CM | POA: Diagnosis not present

## 2017-06-29 DIAGNOSIS — M179 Osteoarthritis of knee, unspecified: Secondary | ICD-10-CM | POA: Diagnosis not present

## 2017-06-30 DIAGNOSIS — G894 Chronic pain syndrome: Secondary | ICD-10-CM | POA: Diagnosis not present

## 2017-06-30 DIAGNOSIS — M179 Osteoarthritis of knee, unspecified: Secondary | ICD-10-CM | POA: Diagnosis not present

## 2017-07-01 DIAGNOSIS — G894 Chronic pain syndrome: Secondary | ICD-10-CM | POA: Diagnosis not present

## 2017-07-01 DIAGNOSIS — M179 Osteoarthritis of knee, unspecified: Secondary | ICD-10-CM | POA: Diagnosis not present

## 2017-07-02 DIAGNOSIS — M179 Osteoarthritis of knee, unspecified: Secondary | ICD-10-CM | POA: Diagnosis not present

## 2017-07-02 DIAGNOSIS — G894 Chronic pain syndrome: Secondary | ICD-10-CM | POA: Diagnosis not present

## 2017-07-03 DIAGNOSIS — M179 Osteoarthritis of knee, unspecified: Secondary | ICD-10-CM | POA: Diagnosis not present

## 2017-07-03 DIAGNOSIS — G894 Chronic pain syndrome: Secondary | ICD-10-CM | POA: Diagnosis not present

## 2017-07-04 DIAGNOSIS — M179 Osteoarthritis of knee, unspecified: Secondary | ICD-10-CM | POA: Diagnosis not present

## 2017-07-04 DIAGNOSIS — G894 Chronic pain syndrome: Secondary | ICD-10-CM | POA: Diagnosis not present

## 2017-07-05 DIAGNOSIS — M179 Osteoarthritis of knee, unspecified: Secondary | ICD-10-CM | POA: Diagnosis not present

## 2017-07-05 DIAGNOSIS — G894 Chronic pain syndrome: Secondary | ICD-10-CM | POA: Diagnosis not present

## 2017-07-06 DIAGNOSIS — F900 Attention-deficit hyperactivity disorder, predominantly inattentive type: Secondary | ICD-10-CM | POA: Diagnosis not present

## 2017-07-06 DIAGNOSIS — M179 Osteoarthritis of knee, unspecified: Secondary | ICD-10-CM | POA: Diagnosis not present

## 2017-07-06 DIAGNOSIS — F4001 Agoraphobia with panic disorder: Secondary | ICD-10-CM | POA: Diagnosis not present

## 2017-07-06 DIAGNOSIS — F250 Schizoaffective disorder, bipolar type: Secondary | ICD-10-CM | POA: Diagnosis not present

## 2017-07-06 DIAGNOSIS — G894 Chronic pain syndrome: Secondary | ICD-10-CM | POA: Diagnosis not present

## 2017-07-06 DIAGNOSIS — F423 Hoarding disorder: Secondary | ICD-10-CM | POA: Diagnosis not present

## 2017-07-07 DIAGNOSIS — M179 Osteoarthritis of knee, unspecified: Secondary | ICD-10-CM | POA: Diagnosis not present

## 2017-07-07 DIAGNOSIS — G894 Chronic pain syndrome: Secondary | ICD-10-CM | POA: Diagnosis not present

## 2017-07-08 DIAGNOSIS — G894 Chronic pain syndrome: Secondary | ICD-10-CM | POA: Diagnosis not present

## 2017-07-08 DIAGNOSIS — M179 Osteoarthritis of knee, unspecified: Secondary | ICD-10-CM | POA: Diagnosis not present

## 2017-07-09 DIAGNOSIS — M179 Osteoarthritis of knee, unspecified: Secondary | ICD-10-CM | POA: Diagnosis not present

## 2017-07-09 DIAGNOSIS — G894 Chronic pain syndrome: Secondary | ICD-10-CM | POA: Diagnosis not present

## 2017-07-10 DIAGNOSIS — M179 Osteoarthritis of knee, unspecified: Secondary | ICD-10-CM | POA: Diagnosis not present

## 2017-07-10 DIAGNOSIS — G894 Chronic pain syndrome: Secondary | ICD-10-CM | POA: Diagnosis not present

## 2017-07-11 DIAGNOSIS — M179 Osteoarthritis of knee, unspecified: Secondary | ICD-10-CM | POA: Diagnosis not present

## 2017-07-11 DIAGNOSIS — G894 Chronic pain syndrome: Secondary | ICD-10-CM | POA: Diagnosis not present

## 2017-07-12 DIAGNOSIS — M179 Osteoarthritis of knee, unspecified: Secondary | ICD-10-CM | POA: Diagnosis not present

## 2017-07-12 DIAGNOSIS — G894 Chronic pain syndrome: Secondary | ICD-10-CM | POA: Diagnosis not present

## 2017-07-13 DIAGNOSIS — M179 Osteoarthritis of knee, unspecified: Secondary | ICD-10-CM | POA: Diagnosis not present

## 2017-07-13 DIAGNOSIS — G894 Chronic pain syndrome: Secondary | ICD-10-CM | POA: Diagnosis not present

## 2017-07-14 DIAGNOSIS — G894 Chronic pain syndrome: Secondary | ICD-10-CM | POA: Diagnosis not present

## 2017-07-14 DIAGNOSIS — M179 Osteoarthritis of knee, unspecified: Secondary | ICD-10-CM | POA: Diagnosis not present

## 2017-07-15 DIAGNOSIS — G894 Chronic pain syndrome: Secondary | ICD-10-CM | POA: Diagnosis not present

## 2017-07-15 DIAGNOSIS — M179 Osteoarthritis of knee, unspecified: Secondary | ICD-10-CM | POA: Diagnosis not present

## 2017-07-16 DIAGNOSIS — M179 Osteoarthritis of knee, unspecified: Secondary | ICD-10-CM | POA: Diagnosis not present

## 2017-07-16 DIAGNOSIS — G894 Chronic pain syndrome: Secondary | ICD-10-CM | POA: Diagnosis not present

## 2017-07-17 DIAGNOSIS — M179 Osteoarthritis of knee, unspecified: Secondary | ICD-10-CM | POA: Diagnosis not present

## 2017-07-17 DIAGNOSIS — G894 Chronic pain syndrome: Secondary | ICD-10-CM | POA: Diagnosis not present

## 2017-07-18 DIAGNOSIS — M179 Osteoarthritis of knee, unspecified: Secondary | ICD-10-CM | POA: Diagnosis not present

## 2017-07-18 DIAGNOSIS — G894 Chronic pain syndrome: Secondary | ICD-10-CM | POA: Diagnosis not present

## 2017-07-19 DIAGNOSIS — G894 Chronic pain syndrome: Secondary | ICD-10-CM | POA: Diagnosis not present

## 2017-07-19 DIAGNOSIS — M179 Osteoarthritis of knee, unspecified: Secondary | ICD-10-CM | POA: Diagnosis not present

## 2017-07-20 DIAGNOSIS — G894 Chronic pain syndrome: Secondary | ICD-10-CM | POA: Diagnosis not present

## 2017-07-20 DIAGNOSIS — M179 Osteoarthritis of knee, unspecified: Secondary | ICD-10-CM | POA: Diagnosis not present

## 2017-07-21 DIAGNOSIS — G894 Chronic pain syndrome: Secondary | ICD-10-CM | POA: Diagnosis not present

## 2017-07-21 DIAGNOSIS — F250 Schizoaffective disorder, bipolar type: Secondary | ICD-10-CM | POA: Diagnosis not present

## 2017-07-21 DIAGNOSIS — M179 Osteoarthritis of knee, unspecified: Secondary | ICD-10-CM | POA: Diagnosis not present

## 2017-07-21 DIAGNOSIS — F4001 Agoraphobia with panic disorder: Secondary | ICD-10-CM | POA: Diagnosis not present

## 2017-07-21 DIAGNOSIS — F423 Hoarding disorder: Secondary | ICD-10-CM | POA: Diagnosis not present

## 2017-07-22 DIAGNOSIS — G894 Chronic pain syndrome: Secondary | ICD-10-CM | POA: Diagnosis not present

## 2017-07-22 DIAGNOSIS — M179 Osteoarthritis of knee, unspecified: Secondary | ICD-10-CM | POA: Diagnosis not present

## 2017-07-23 DIAGNOSIS — G894 Chronic pain syndrome: Secondary | ICD-10-CM | POA: Diagnosis not present

## 2017-07-23 DIAGNOSIS — M179 Osteoarthritis of knee, unspecified: Secondary | ICD-10-CM | POA: Diagnosis not present

## 2017-07-24 DIAGNOSIS — G894 Chronic pain syndrome: Secondary | ICD-10-CM | POA: Diagnosis not present

## 2017-07-24 DIAGNOSIS — M179 Osteoarthritis of knee, unspecified: Secondary | ICD-10-CM | POA: Diagnosis not present

## 2017-07-26 DIAGNOSIS — F250 Schizoaffective disorder, bipolar type: Secondary | ICD-10-CM | POA: Diagnosis not present

## 2017-07-26 DIAGNOSIS — F4001 Agoraphobia with panic disorder: Secondary | ICD-10-CM | POA: Diagnosis not present

## 2017-07-28 DIAGNOSIS — N611 Abscess of the breast and nipple: Secondary | ICD-10-CM | POA: Diagnosis not present

## 2017-07-29 ENCOUNTER — Other Ambulatory Visit: Payer: Self-pay | Admitting: Student

## 2017-07-29 DIAGNOSIS — N611 Abscess of the breast and nipple: Secondary | ICD-10-CM

## 2017-08-01 ENCOUNTER — Ambulatory Visit
Admission: RE | Admit: 2017-08-01 | Discharge: 2017-08-01 | Disposition: A | Discharge status: Home / Self Care | Payer: Medicare HMO | Source: Ambulatory Visit | Attending: Student | Admitting: Student

## 2017-08-01 ENCOUNTER — Ambulatory Visit: Payer: Self-pay | Admitting: Surgery

## 2017-08-01 DIAGNOSIS — N611 Abscess of the breast and nipple: Secondary | ICD-10-CM

## 2017-08-01 DIAGNOSIS — N6312 Unspecified lump in the right breast, upper inner quadrant: Secondary | ICD-10-CM | POA: Diagnosis not present

## 2017-08-01 NOTE — H&P (View-Only) (Signed)
Haileigh M Ziska Documented: 08/01/2017 8:53 AM Location: [facility] Patient #: [id number] DOB: 10/26/1960 Single / Language: English / Race: Black or African American Female  History of Present Illness (Ivi Griffith A. Jayten Gabbard MD; 08/01/2017 9:39 AM) Patient words: Patient returns to office after being seen last week for a recurrent right breast abscess. She was seen by the PA in the office and placed on antibiotics. Attempt was made to aspirate the area but there is no material within it. She was unable to be seen Friday at the [facility] [place] and is going to run this morning for ultrasound to reevaluate the area. She is at 7 breast abscesses last year. The locations of same under the right nipple. They been treated with aspiration antibiotics. She comes today for evaluation for possible more definitive treatment. She is a history of a gastric bypass 2 years ago but has not been consistent with her follow-up. She does have history of diabetes, obesity and smoking. She smokes and she was 13. Area is red and tender. She's been placed on doxycycline with some improvement.  The patient is a 57 year old female.   Allergies (Chemira Jones, CMA; 08/01/2017 8:54 AM) Sulfa Drugs Aspircaf *ANALGESICS - NonNarcotic*  Medication History (Chemira Jones, CMA; 08/01/2017 8:54 AM) Doxycycline Hyclate (100MG Tablet, 1 (one) Oral two times daily, Taken starting 07/28/2017) Active. Diflucan (150MG Tablet, one Oral ONE DAILY, Taken starting 07/28/2017) Active. Amphetamine-Dextroamphet ER (30MG Capsule ER 24HR, Oral) Active. Amphetamine-Dextroamphetamine (10MG Tablet, Oral) Active. Fluticasone Propionate (50MCG/ACT Suspension, Nasal) Active. Lyrica (75MG Capsule, Oral) Active. Latuda (60MG Tablet, Oral) Active. Doxepin HCl (75MG Capsule, Oral) Active. Ventolin HFA (108 (90 Base)MCG/ACT Aerosol Soln, Inhalation) Active. Methylphenidate HCl (20MG Tablet, Oral) Active. Morphine  Sulfate ER (15MG Tablet ER, Oral) Active. OxyCODONE HCl (10MG Tablet, Oral) Active. ALPRAZolam (1MG Tablet, Oral) Active. Diazepam (10MG Tablet, Oral) Active. Accu-Chek Aviva Plus (w/Device Kit,) Active. Accu-Chek Aviva Plus (In Vitro) Active. Accu-Chek Aviva (In Vitro) Active. Albuterol Sulfate ((2.5 MG/3ML)0.083% Nebulized Soln, Inhalation) Active. Alcohol Prep (70% Pad,) Active. Assure Comfort Lancets 30G Active. BuPROPion HCl ER (XL) (300MG Tablet ER 24HR, Oral) Active. Crestor (20MG Tablet, Oral) Active. EpiPen 2-Pak (0.3MG/0.3ML Soln Auto-inj, Injection) Active. Fluconazole (150MG Tablet, Oral) Active. Gabapentin (600MG Tablet, Oral) Active. Ibuprofen (800MG Tablet, Oral) Active. Adjustable Lancing Device Active. Levothyroxine Sodium (125MCG Tablet, Oral) Active. Lidocaine (5% Ointment, External) Active. Losartan Potassium-HCTZ (100-25MG Tablet, Oral) Active. MethylPREDNISolone (Pak) (4MG Tablet, Oral) Active. Nabumetone (750MG Tablet, Oral) Active. QUEtiapine Fumarate (100MG Tablet, Oral) Active. Voltaren (1% Gel, Transdermal) Active. Zetia (10MG Tablet, Oral) Active. Medications Reconciled    Vitals (Chemira Jones CMA; 08/01/2017 8:54 AM) 08/01/2017 8:53 AM Weight: 261.2 lb Height: 65in Body Surface Area: 2.22 m Body Mass Index: 43.47 kg/m  Temp.: 98.6F(Oral)  Pulse: 93 (Regular)  BP: 160/98 (Sitting, Left Arm, Standard)      Physical Exam (Darsh Vandevoort A. Barry Faircloth MD; 08/01/2017 9:40 AM)  General Mental Status-Alert. General Appearance-Consistent with stated age. Hydration-Well hydrated. Voice-Normal.  Head and Neck Head-normocephalic, atraumatic with no lesions or palpable masses. Trachea-midline. Thyroid Gland Characteristics - normal size and consistency.  Chest and Lung Exam Chest and lung exam reveals -quiet, even and easy respiratory effort with no use of accessory muscles and on auscultation, normal  breath sounds, no adventitious sounds and normal vocal resonance. Inspection Chest Wall - Normal. Back - normal.  Breast Note: Mild swelling of her right nipple. Soft. Bowel redness without significant fluctuance on exam. Remainder of right breast is normal. left breast   normal  Cardiovascular Cardiovascular examination reveals -normal heart sounds, regular rate and rhythm with no murmurs and normal pedal pulses bilaterally.  Neurologic Note: chronic pain  Musculoskeletal Normal Exam - Left-Upper Extremity Strength Normal and Lower Extremity Strength Normal. Normal Exam - Right-Upper Extremity Strength Normal and Lower Extremity Strength Normal.    Assessment & Plan (Jahking Lesser A. Zekiah Caruth MD; 08/01/2017 9:42 AM)  ABSCESS OF BREAST, RIGHT (N61.1) Impression: PMHx of diabetes and smoker hig risk of recurrence if she does not stop smoking need right central duct excision Risk of surgery include bleeding, infection, seroma, more surgery, use of seed/wire, wound care, cosmetic deformity and the need for other treatments, death , blood clots, death. Pt agrees to proceed.  Current Plans You are being scheduled for surgery- Our schedulers will call you.  You should hear from our office's scheduling department within 5 working days about the location, date, and time of surgery. We try to make accommodations for patient's preferences in scheduling surgery, but sometimes the OR schedule or the surgeon's schedule prevents [place] from making those accommodations.  If you have not heard from our office [phone]) in 5 working days, call the office and ask for your surgeon's nurse.  If you have other questions about your diagnosis, plan, or surgery, call the office and ask for your surgeon's nurse.  Pt Education - [facility] Breast Biopsy HCI: discussed with patient and provided information. Pt Education - [facility] Free Text Education/Instructions: discussed with patient and provided  information.  MORBID OBESITY (E66.01) Impression: needs to follow up with bariatric clinic  Current Plans Pt Education - [facility] STOP SMOKING!

## 2017-08-01 NOTE — H&P (Signed)
Caroline Williams Documented: 08/01/2017 8:53 AM Location: [facility] Patient #: [id number] DOB: 07/28/1960 Single / Language: Cleophus Molt / Race: Black or African American Female  History of Present Illness Caroline Moores A. Caroline Yarbough MD; 08/01/2017 9:39 AM) Patient words: Patient returns to office after being seen last week for a recurrent right breast abscess. She was seen by the PA in the office and placed on antibiotics. Attempt was made to aspirate the area but there is no material within it. She was unable to be seen Friday at the [facility] and is going to run this morning for ultrasound to reevaluate the area. She is at 7 breast abscesses last year. The locations of same under the right nipple. They been treated with aspiration antibiotics. She comes today for evaluation for possible more definitive treatment. She is a history of a gastric bypass 2 years ago but has not been consistent with her follow-up. She does have history of diabetes, obesity and smoking. She smokes and she was 1. Area is red and tender. She's been placed on doxycycline with some improvement.  The patient is a 57 year old female.   Allergies Malachi Bonds, CMA; 08/01/2017 8:54 AM) Sulfa Drugs Aspircaf *ANALGESICS - NonNarcotic*  Medication History (Chemira Jones, CMA; 08/01/2017 8:54 AM) Doxycycline Hyclate (100MG Tablet, 1 (one) Oral two times daily, Taken starting 07/28/2017) Active. Diflucan (150MG Tablet, one Oral ONE DAILY, Taken starting 07/28/2017) Active. Amphetamine-Dextroamphet ER (30MG Capsule ER 24HR, Oral) Active. Amphetamine-Dextroamphetamine (10MG Tablet, Oral) Active. Fluticasone Propionate (50MCG/ACT Suspension, Nasal) Active. Lyrica (75MG Capsule, Oral) Active. Latuda (60MG Tablet, Oral) Active. Doxepin HCl (75MG Capsule, Oral) Active. Ventolin HFA (108 (90 Base)MCG/ACT Aerosol Soln, Inhalation) Active. Methylphenidate HCl (20MG Tablet, Oral) Active. Morphine  Sulfate ER (15MG Tablet ER, Oral) Active. OxyCODONE HCl (10MG Tablet, Oral) Active. ALPRAZolam (1MG Tablet, Oral) Active. Diazepam (10MG Tablet, Oral) Active. Accu-Chek Aviva Plus (w/Device Kit,) Active. Accu-Chek Aviva Plus (In Vitro) Active. Accu-Chek Aviva (In Vitro) Active. Albuterol Sulfate ((2.5 MG/3ML)0.083% Nebulized Soln, Inhalation) Active. Alcohol Prep (70% Pad,) Active. Assure Comfort Lancets 30G Active. BuPROPion HCl ER (XL) (300MG Tablet ER 24HR, Oral) Active. Crestor (20MG Tablet, Oral) Active. EpiPen 2-Pak (0.3MG/0.3ML Soln Auto-inj, Injection) Active. Fluconazole (150MG Tablet, Oral) Active. Gabapentin (600MG Tablet, Oral) Active. Ibuprofen (800MG Tablet, Oral) Active. Adjustable Lancing Device Active. Levothyroxine Sodium (125MCG Tablet, Oral) Active. Lidocaine (5% Ointment, External) Active. Losartan Potassium-HCTZ (100-25MG Tablet, Oral) Active. MethylPREDNISolone (Pak) (4MG Tablet, Oral) Active. Nabumetone (750MG Tablet, Oral) Active. QUEtiapine Fumarate (100MG Tablet, Oral) Active. Voltaren (1% Gel, Transdermal) Active. Zetia (10MG Tablet, Oral) Active. Medications Reconciled    Vitals (Chemira Jones CMA; 08/01/2017 8:54 AM) 08/01/2017 8:53 AM Weight: 261.2 lb Height: 65in Body Surface Area: 2.22 m Body Mass Index: 43.47 kg/m  Temp.: 98.86F(Oral)  Pulse: 93 (Regular)  BP: 160/98 (Sitting, Left Arm, Standard)      Physical Exam (Marlee Trentman A. Eveleen Mcnear MD; 08/01/2017 9:40 AM)  General Mental Status-Alert. General Appearance-Consistent with stated age. Hydration-Well hydrated. Voice-Normal.  Head and Neck Head-normocephalic, atraumatic with no lesions or palpable masses. Trachea-midline. Thyroid Gland Characteristics - normal size and consistency.  Chest and Lung Exam Chest and lung exam reveals -quiet, even and easy respiratory effort with no use of accessory muscles and on auscultation, normal  breath sounds, no adventitious sounds and normal vocal resonance. Inspection Chest Wall - Normal. Back - normal.  Breast Note: Mild swelling of her right nipple. Soft. Bowel redness without significant fluctuance on exam. Remainder of right breast is normal. left breast  normal  Cardiovascular Cardiovascular examination reveals -normal heart sounds, regular rate and rhythm with no murmurs and normal pedal pulses bilaterally.  Neurologic Note: chronic pain  Musculoskeletal Normal Exam - Left-Upper Extremity Strength Normal and Lower Extremity Strength Normal. Normal Exam - Right-Upper Extremity Strength Normal and Lower Extremity Strength Normal.    Assessment & Plan (Maurene Hollin A. Zsofia Prout MD; 08/01/2017 9:42 AM)  ABSCESS OF BREAST, RIGHT (N61.1) Impression: PMHx of diabetes and smoker hig risk of recurrence if she does not stop smoking need right central duct excision Risk of surgery include bleeding, infection, seroma, more surgery, use of seed/wire, wound care, cosmetic deformity and the need for other treatments, death , blood clots, death. Pt agrees to proceed.  Current Plans You are being scheduled for surgery- Our schedulers will call you.  You should hear from our office's scheduling department within 5 working days about the location, date, and time of surgery. We try to make accommodations for patient's preferences in scheduling surgery, but sometimes the OR schedule or the surgeon's schedule prevents [place] from making those accommodations.  If you have not heard from our office [phone]) in 5 working days, call the office and ask for your surgeon's nurse.  If you have other questions about your diagnosis, plan, or surgery, call the office and ask for your surgeon's nurse.  Pt Education - [facility] Breast Biopsy HCI: discussed with patient and provided information. Pt Education - [facility] Free Text Education/Instructions: discussed with patient and provided  information.  MORBID OBESITY (E66.01) Impression: needs to follow up with bariatric clinic  Current Plans Pt Education - [facility] STOP SMOKING!

## 2017-08-02 DIAGNOSIS — M25561 Pain in right knee: Secondary | ICD-10-CM | POA: Diagnosis not present

## 2017-08-02 DIAGNOSIS — M47816 Spondylosis without myelopathy or radiculopathy, lumbar region: Secondary | ICD-10-CM | POA: Diagnosis not present

## 2017-08-07 LAB — AEROBIC/ANAEROBIC CULTURE (SURGICAL/DEEP WOUND)

## 2017-08-07 LAB — AEROBIC/ANAEROBIC CULTURE W GRAM STAIN (SURGICAL/DEEP WOUND): Special Requests: NORMAL

## 2017-08-29 ENCOUNTER — Other Ambulatory Visit: Payer: Self-pay

## 2017-08-29 ENCOUNTER — Encounter (HOSPITAL_BASED_OUTPATIENT_CLINIC_OR_DEPARTMENT_OTHER): Payer: Self-pay | Admitting: *Deleted

## 2017-08-29 NOTE — Progress Notes (Signed)
   08/29/17 1202  OBSTRUCTIVE SLEEP APNEA  Have you ever been diagnosed with sleep apnea through a sleep study? Yes  If yes, do you have and use a CPAP or BPAP machine every night? 0 (stes machine broke 3 yrs ago and did not replace)  Do you snore loudly (loud enough to be heard through closed doors)?  0  Do you often feel tired, fatigued, or sleepy during the daytime (such as falling asleep during driving or talking to someone)? 1  Has anyone observed you stop breathing during your sleep? 0  Do you have, or are you being treated for high blood pressure? 1  BMI more than 35 kg/m2? 1  Age > 50 (1-yes) 1  Neck circumference greater than:Female 16 inches or larger, Female 17inches or larger? 0  Female Gender (Yes=1) 0  Obstructive Sleep Apnea Score 4

## 2017-08-30 ENCOUNTER — Ambulatory Visit (HOSPITAL_BASED_OUTPATIENT_CLINIC_OR_DEPARTMENT_OTHER)
Admission: RE | Admit: 2017-08-30 | Discharge: 2017-08-31 | Disposition: A | Discharge status: Home / Self Care | Payer: Medicare HMO | Source: Ambulatory Visit | Attending: Surgery | Admitting: Surgery

## 2017-08-30 ENCOUNTER — Ambulatory Visit (HOSPITAL_BASED_OUTPATIENT_CLINIC_OR_DEPARTMENT_OTHER): Payer: Medicare HMO

## 2017-08-30 DIAGNOSIS — N611 Abscess of the breast and nipple: Secondary | ICD-10-CM | POA: Insufficient documentation

## 2017-08-30 DIAGNOSIS — F319 Bipolar disorder, unspecified: Secondary | ICD-10-CM | POA: Insufficient documentation

## 2017-08-30 DIAGNOSIS — Z79890 Hormone replacement therapy (postmenopausal): Secondary | ICD-10-CM | POA: Insufficient documentation

## 2017-08-30 DIAGNOSIS — Z7951 Long term (current) use of inhaled steroids: Secondary | ICD-10-CM | POA: Diagnosis not present

## 2017-08-30 DIAGNOSIS — G4730 Sleep apnea, unspecified: Secondary | ICD-10-CM | POA: Diagnosis not present

## 2017-08-30 DIAGNOSIS — E119 Type 2 diabetes mellitus without complications: Secondary | ICD-10-CM | POA: Diagnosis not present

## 2017-08-30 DIAGNOSIS — Z6841 Body Mass Index (BMI) 40.0 and over, adult: Secondary | ICD-10-CM | POA: Insufficient documentation

## 2017-08-30 DIAGNOSIS — Z79899 Other long term (current) drug therapy: Secondary | ICD-10-CM | POA: Diagnosis not present

## 2017-08-30 DIAGNOSIS — Z87891 Personal history of nicotine dependence: Secondary | ICD-10-CM | POA: Diagnosis not present

## 2017-08-30 DIAGNOSIS — J45909 Unspecified asthma, uncomplicated: Secondary | ICD-10-CM | POA: Insufficient documentation

## 2017-08-30 DIAGNOSIS — F209 Schizophrenia, unspecified: Secondary | ICD-10-CM | POA: Diagnosis not present

## 2017-08-30 DIAGNOSIS — I10 Essential (primary) hypertension: Secondary | ICD-10-CM | POA: Insufficient documentation

## 2017-08-30 DIAGNOSIS — Z9884 Bariatric surgery status: Secondary | ICD-10-CM | POA: Insufficient documentation

## 2017-08-30 DIAGNOSIS — N6452 Nipple discharge: Secondary | ICD-10-CM | POA: Insufficient documentation

## 2017-08-30 DIAGNOSIS — K219 Gastro-esophageal reflux disease without esophagitis: Secondary | ICD-10-CM | POA: Diagnosis not present

## 2017-08-30 HISTORY — DX: Other chronic pain: G89.29

## 2017-08-30 LAB — BASIC METABOLIC PANEL
Anion gap: 8 (ref 5–15)
BUN: 8 mg/dL (ref 6–20)
CO2: 26 mmol/L (ref 22–32)
Calcium: 9.1 mg/dL (ref 8.9–10.3)
Chloride: 108 mmol/L (ref 101–111)
Creatinine, Ser: 0.97 mg/dL (ref 0.44–1.00)
GFR calc Af Amer: >60 mL/min (ref >60)
GFR calc non Af Amer: >60 mL/min (ref >60)
Glucose, Bld: 103 mg/dL — ABNORMAL HIGH (ref 65–99)
Potassium: 3.6 mmol/L (ref 3.5–5.1)
Sodium: 142 mmol/L (ref 135–145)

## 2017-08-30 NOTE — Progress Notes (Signed)
Ensure pre surgery drink given with instructions to complete by 0515 dos, pt verbalized understanding. 

## 2017-08-31 ENCOUNTER — Encounter (HOSPITAL_BASED_OUTPATIENT_CLINIC_OR_DEPARTMENT_OTHER): Payer: Self-pay | Admitting: Certified Registered Nurse Anesthetist

## 2017-08-31 ENCOUNTER — Ambulatory Visit (HOSPITAL_BASED_OUTPATIENT_CLINIC_OR_DEPARTMENT_OTHER): Payer: Medicare HMO | Admitting: Anesthesiology

## 2017-08-31 ENCOUNTER — Other Ambulatory Visit: Payer: Self-pay

## 2017-08-31 ENCOUNTER — Encounter (HOSPITAL_BASED_OUTPATIENT_CLINIC_OR_DEPARTMENT_OTHER)
Admission: RE | Disposition: A | Discharge status: Home / Self Care | Payer: Self-pay | Source: Ambulatory Visit | Attending: Surgery

## 2017-08-31 DIAGNOSIS — N6081 Other benign mammary dysplasias of right breast: Secondary | ICD-10-CM | POA: Diagnosis not present

## 2017-08-31 DIAGNOSIS — G4730 Sleep apnea, unspecified: Secondary | ICD-10-CM | POA: Diagnosis not present

## 2017-08-31 DIAGNOSIS — K219 Gastro-esophageal reflux disease without esophagitis: Secondary | ICD-10-CM | POA: Diagnosis not present

## 2017-08-31 DIAGNOSIS — J45909 Unspecified asthma, uncomplicated: Secondary | ICD-10-CM | POA: Diagnosis not present

## 2017-08-31 DIAGNOSIS — N611 Abscess of the breast and nipple: Secondary | ICD-10-CM | POA: Diagnosis not present

## 2017-08-31 DIAGNOSIS — I10 Essential (primary) hypertension: Secondary | ICD-10-CM | POA: Diagnosis not present

## 2017-08-31 DIAGNOSIS — Z6841 Body Mass Index (BMI) 40.0 and over, adult: Secondary | ICD-10-CM | POA: Diagnosis not present

## 2017-08-31 DIAGNOSIS — E119 Type 2 diabetes mellitus without complications: Secondary | ICD-10-CM | POA: Diagnosis not present

## 2017-08-31 DIAGNOSIS — N6452 Nipple discharge: Secondary | ICD-10-CM | POA: Diagnosis not present

## 2017-08-31 DIAGNOSIS — N6011 Diffuse cystic mastopathy of right breast: Secondary | ICD-10-CM | POA: Diagnosis not present

## 2017-08-31 HISTORY — PX: BREAST DUCTAL SYSTEM EXCISION: SHX5242

## 2017-08-31 LAB — GLUCOSE, CAPILLARY: Glucose-Capillary: 93 mg/dL (ref 65–99)

## 2017-08-31 SURGERY — EXCISION DUCTAL SYSTEM BREAST
Anesthesia: Regional | Site: Breast | Laterality: Right

## 2017-08-31 MED ORDER — KETOROLAC TROMETHAMINE 30 MG/ML IJ SOLN
INTRAMUSCULAR | Status: DC | PRN
  Administered 2017-08-31: 30 mg via INTRAVENOUS

## 2017-08-31 MED ORDER — PROPOFOL 10 MG/ML IV BOLUS
INTRAVENOUS | Status: DC | PRN
  Administered 2017-08-31: 170 mg via INTRAVENOUS

## 2017-08-31 MED ORDER — ALBUTEROL SULFATE (2.5 MG/3ML) 0.083% IN NEBU
INHALATION_SOLUTION | RESPIRATORY_TRACT | Status: AC
  Filled 2017-08-31: qty 3

## 2017-08-31 MED ORDER — DEXAMETHASONE SODIUM PHOSPHATE 10 MG/ML IJ SOLN
INTRAMUSCULAR | Status: AC
  Filled 2017-08-31: qty 1

## 2017-08-31 MED ORDER — ONDANSETRON HCL 4 MG/2ML IJ SOLN
INTRAMUSCULAR | Status: DC | PRN
  Administered 2017-08-31: 4 mg via INTRAVENOUS

## 2017-08-31 MED ORDER — GLYCOPYRROLATE 0.2 MG/ML IJ SOLN
INTRAMUSCULAR | Status: DC | PRN
  Administered 2017-08-31 (×2): 0.1 mg via INTRAVENOUS

## 2017-08-31 MED ORDER — ACETAMINOPHEN 10 MG/ML IV SOLN: 1000.0000 mg | Freq: Once | INTRAVENOUS | Status: DC | PRN

## 2017-08-31 MED ORDER — DEXAMETHASONE SODIUM PHOSPHATE 10 MG/ML IJ SOLN
INTRAMUSCULAR | Status: DC | PRN
  Administered 2017-08-31: 10 mg via INTRAVENOUS

## 2017-08-31 MED ORDER — SCOPOLAMINE 1 MG/3DAYS TD PT72: 1.0000 | MEDICATED_PATCH | Freq: Once | TRANSDERMAL | Status: DC | PRN

## 2017-08-31 MED ORDER — HYDROMORPHONE HCL 1 MG/ML IJ SOLN
0.2500 mg | INTRAMUSCULAR | Status: DC | PRN
  Administered 2017-08-31: 0.25 mg via INTRAVENOUS

## 2017-08-31 MED ORDER — PROPOFOL 10 MG/ML IV BOLUS
INTRAVENOUS | Status: AC
  Filled 2017-08-31: qty 40

## 2017-08-31 MED ORDER — HYDROCODONE-ACETAMINOPHEN 5-325 MG PO TABS: 1.0000 | ORAL_TABLET | Freq: Four times a day (QID) | ORAL | 0 refills | Status: DC | PRN

## 2017-08-31 MED ORDER — HYDROCODONE-ACETAMINOPHEN 7.5-325 MG PO TABS: 1.0000 | ORAL_TABLET | Freq: Once | ORAL | Status: DC | PRN

## 2017-08-31 MED ORDER — ACETAMINOPHEN 500 MG PO TABS
ORAL_TABLET | ORAL | Status: AC
  Filled 2017-08-31: qty 2

## 2017-08-31 MED ORDER — CHLORHEXIDINE GLUCONATE CLOTH 2 % EX PADS: 6.0000 | MEDICATED_PAD | Freq: Once | CUTANEOUS | Status: DC

## 2017-08-31 MED ORDER — DEXTROSE 5 % IV SOLN
3.0000 g | INTRAVENOUS | Status: AC
  Administered 2017-08-31: 1 g via INTRAVENOUS
  Administered 2017-08-31: 2 g via INTRAVENOUS

## 2017-08-31 MED ORDER — KETOROLAC TROMETHAMINE 30 MG/ML IJ SOLN
INTRAMUSCULAR | Status: AC
  Filled 2017-08-31: qty 1

## 2017-08-31 MED ORDER — LIDOCAINE HCL (CARDIAC) 20 MG/ML IV SOLN
INTRAVENOUS | Status: DC | PRN
  Administered 2017-08-31: 80 mg via INTRAVENOUS

## 2017-08-31 MED ORDER — FENTANYL CITRATE (PF) 100 MCG/2ML IJ SOLN
INTRAMUSCULAR | Status: AC
  Filled 2017-08-31: qty 2

## 2017-08-31 MED ORDER — ACETAMINOPHEN 500 MG PO TABS
1000.0000 mg | ORAL_TABLET | ORAL | Status: AC
  Administered 2017-08-31: 1000 mg via ORAL

## 2017-08-31 MED ORDER — BUPIVACAINE-EPINEPHRINE (PF) 0.25% -1:200000 IJ SOLN
INTRAMUSCULAR | Status: AC
  Filled 2017-08-31: qty 90

## 2017-08-31 MED ORDER — HYDROMORPHONE HCL 1 MG/ML IJ SOLN
INTRAMUSCULAR | Status: AC
  Filled 2017-08-31: qty 0.5

## 2017-08-31 MED ORDER — FENTANYL CITRATE (PF) 100 MCG/2ML IJ SOLN
INTRAMUSCULAR | Status: DC | PRN
  Administered 2017-08-31 (×2): 25 ug via INTRAVENOUS

## 2017-08-31 MED ORDER — ALBUTEROL SULFATE (2.5 MG/3ML) 0.083% IN NEBU
2.5000 mg | INHALATION_SOLUTION | Freq: Four times a day (QID) | RESPIRATORY_TRACT | Status: DC | PRN
  Administered 2017-08-31: 2.5 mg via RESPIRATORY_TRACT

## 2017-08-31 MED ORDER — GABAPENTIN 300 MG PO CAPS: 300.0000 mg | ORAL_CAPSULE | ORAL | Status: DC

## 2017-08-31 MED ORDER — FENTANYL CITRATE (PF) 100 MCG/2ML IJ SOLN: 50.0000 ug | INTRAMUSCULAR | Status: DC | PRN

## 2017-08-31 MED ORDER — 0.9 % SODIUM CHLORIDE (POUR BTL) OPTIME
TOPICAL | Status: DC | PRN
  Administered 2017-08-31: 1000 mL

## 2017-08-31 MED ORDER — MIDAZOLAM HCL 2 MG/2ML IJ SOLN
INTRAMUSCULAR | Status: DC | PRN
  Administered 2017-08-31: 1 mg via INTRAVENOUS

## 2017-08-31 MED ORDER — MIDAZOLAM HCL 2 MG/2ML IJ SOLN
INTRAMUSCULAR | Status: AC
  Filled 2017-08-31: qty 2

## 2017-08-31 MED ORDER — MEPERIDINE HCL 25 MG/ML IJ SOLN: 6.2500 mg | INTRAMUSCULAR | Status: DC | PRN

## 2017-08-31 MED ORDER — PROMETHAZINE HCL 25 MG/ML IJ SOLN: 6.2500 mg | INTRAMUSCULAR | Status: DC | PRN

## 2017-08-31 MED ORDER — CEFAZOLIN SODIUM-DEXTROSE 2-4 GM/100ML-% IV SOLN
INTRAVENOUS | Status: AC
  Filled 2017-08-31: qty 100

## 2017-08-31 MED ORDER — ONDANSETRON HCL 4 MG/2ML IJ SOLN
INTRAMUSCULAR | Status: AC
  Filled 2017-08-31: qty 2

## 2017-08-31 MED ORDER — BUPIVACAINE-EPINEPHRINE 0.25% -1:200000 IJ SOLN
INTRAMUSCULAR | Status: DC | PRN
  Administered 2017-08-31: 20 mL

## 2017-08-31 MED ORDER — LACTATED RINGERS IV SOLN
INTRAVENOUS | Status: DC
  Administered 2017-08-31: 09:00:00 via INTRAVENOUS

## 2017-08-31 MED ORDER — LIDOCAINE HCL (CARDIAC) 20 MG/ML IV SOLN
INTRAVENOUS | Status: AC
  Filled 2017-08-31: qty 5

## 2017-08-31 MED ORDER — CEFAZOLIN SODIUM-DEXTROSE 1-4 GM/50ML-% IV SOLN
INTRAVENOUS | Status: AC
  Filled 2017-08-31: qty 50

## 2017-08-31 MED ORDER — MIDAZOLAM HCL 2 MG/2ML IJ SOLN: 1.0000 mg | INTRAMUSCULAR | Status: DC | PRN

## 2017-08-31 SURGICAL SUPPLY — 35 items
ADH SKN CLS APL DERMABOND .7 (GAUZE/BANDAGES/DRESSINGS) ×1
BINDER BREAST XXLRG (GAUZE/BANDAGES/DRESSINGS) ×1 IMPLANT
BLADE SURG 15 STRL LF DISP TIS (BLADE) ×1 IMPLANT
BLADE SURG 15 STRL SS (BLADE) ×2
CANISTER SUCT 1200ML W/VALVE (MISCELLANEOUS) ×2 IMPLANT
CHLORAPREP W/TINT 26ML (MISCELLANEOUS) ×2 IMPLANT
COVER BACK TABLE 60X90IN (DRAPES) ×2 IMPLANT
COVER MAYO STAND STRL (DRAPES) ×2 IMPLANT
DERMABOND ADVANCED (GAUZE/BANDAGES/DRESSINGS) ×1
DERMABOND ADVANCED .7 DNX12 (GAUZE/BANDAGES/DRESSINGS) ×1 IMPLANT
DRAPE LAPAROTOMY 100X72 PEDS (DRAPES) ×2 IMPLANT
DRAPE UTILITY XL STRL (DRAPES) ×2 IMPLANT
ELECT COATED BLADE 2.86 ST (ELECTRODE) ×2 IMPLANT
ELECT REM PT RETURN 9FT ADLT (ELECTROSURGICAL) ×2
ELECTRODE REM PT RTRN 9FT ADLT (ELECTROSURGICAL) ×1 IMPLANT
GLOVE BIO SURGEON STRL SZ7 (GLOVE) ×2 IMPLANT
GLOVE BIOGEL PI IND STRL 8 (GLOVE) ×1 IMPLANT
GLOVE BIOGEL PI INDICATOR 8 (GLOVE) ×1
GLOVE ECLIPSE 8.0 STRL XLNG CF (GLOVE) ×2 IMPLANT
GOWN STRL REUS W/ TWL LRG LVL3 (GOWN DISPOSABLE) ×2 IMPLANT
GOWN STRL REUS W/TWL LRG LVL3 (GOWN DISPOSABLE) ×4
NDL HYPO 25X1 1.5 SAFETY (NEEDLE) ×1 IMPLANT
NEEDLE HYPO 25X1 1.5 SAFETY (NEEDLE) ×2 IMPLANT
NS IRRIG 1000ML POUR BTL (IV SOLUTION) ×2 IMPLANT
PACK BASIN DAY SURGERY FS (CUSTOM PROCEDURE TRAY) ×2 IMPLANT
PENCIL BUTTON HOLSTER BLD 10FT (ELECTRODE) ×2 IMPLANT
SLEEVE SCD COMPRESS KNEE MED (MISCELLANEOUS) ×2 IMPLANT
SPONGE LAP 4X18 X RAY DECT (DISPOSABLE) ×2 IMPLANT
SUT MON AB 4-0 PC3 18 (SUTURE) ×2 IMPLANT
SUT SILK 2 0 SH (SUTURE) IMPLANT
SUT VICRYL 3-0 CR8 SH (SUTURE) ×2 IMPLANT
SYR CONTROL 10ML LL (SYRINGE) ×2 IMPLANT
TOWEL OR 17X24 6PK STRL BLUE (TOWEL DISPOSABLE) ×4 IMPLANT
TUBE CONNECTING 20X1/4 (TUBING) ×2 IMPLANT
YANKAUER SUCT BULB TIP NO VENT (SUCTIONS) ×2 IMPLANT

## 2017-08-31 NOTE — Op Note (Signed)
Preoperative diagnosis: Recurrent chronic right breast abscess with nipple discharge  Postoperative diagnosis: Same  Procedure: Right breast central duct excision  Surgeon: Erroll Luna MD  Anesthesia: General with local  EBL: 10 cc  IV fluids: Per anesthesia record  Specimens: Right breast tissue from subareolar abscess region  Drains: None  Indications for procedure: The patient is a 57 year old female with recurrent right breast abscesses.  She also has chronic nipple discharge in the right side.  She has had multiple aspirations and courses of antibiotics for a recurrent right breast abscess in the upper inner quadrant.  I discussed options of debridement of this area since these areas are quite recurrent.  They do get better with medical management but then recur she had multiple abscesses in the last few years.  She is a smoker and I cautioned her about surgery and smoking.  I also discussed recurrence rates are much higher in smokers.  She would like to proceed due to recurrent infections.  Currently, she has no active infection in the breast.The procedure has been discussed with the patient. Alternatives to surgery have been discussed with the patient.  Risks of surgery include bleeding,  Infection,  Seroma formation, death,  and the need for further surgery.   The patient understands and wishes to proceed.     Description of procedure: The patient was met in the holding area.  The right breast was marked as the correct side.  Questions were answered.  She was taken back to the operating room and placed upon the OR table.  After induction of general anesthesia, the right breast was prepped and draped in sterile fashion.  Timeout was done.  She received preoperative antibiotics.  Local anesthetic was infiltrated along the medial border of the nipple areolar complex.  A probe was used in a central duct was identified which is draining some clear fluid.  The probe then was placed into  the duct and this extended to the right upper inner quadrant approximately 2 cm from the edge of the nipple areolar complex consistent with her previous abscesses were.  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  After incision was made along the medial edge of the nipple areolar complex, the probe was identified.  The entire tract from the nipple to the area where the old abscess cavity was located was excised.  The tissue was healthy otherwise.  This was irrigated.  Cautery was used for hemostasis.  I saw no evidence of any other signs of infection.  The wound was then closed with 3-0 Vicryl and 4-0 Monocryl.  Tissue sent to pathology.  Dermabond applied.  All final counts were found to be correct.  Patient was awoke extubated taken to recovery in satisfactory condition.

## 2017-08-31 NOTE — Discharge Instructions (Signed)
[place] Office Phone Number [phone]  BREAST BIOPSY/ PARTIAL MASTECTOMY: POST OP INSTRUCTIONS  Always review your discharge instruction sheet given to you by the facility where your surgery was performed.  IF YOU HAVE DISABILITY OR FAMILY LEAVE FORMS, YOU MUST BRING THEM TO THE OFFICE FOR PROCESSING.  DO NOT GIVE THEM TO YOUR DOCTOR.  1. A prescription for pain medication may be given to you upon discharge.  Take your pain medication as prescribed, if needed.  If narcotic pain medicine is not needed, then you may take acetaminophen (Tylenol) or ibuprofen (Advil) as needed. No Tylenol until after 1:30pm if needed. No Ibuprofen until after 3:15pm if needed. 2. Take your usually prescribed medications unless otherwise directed 3. If you need a refill on your pain medication, please contact your pharmacy.  They will contact our office to request authorization.  Prescriptions will not be filled after 5pm or on week-ends. 4. You should eat very light the first 24 hours after surgery, such as soup, crackers, pudding, etc.  Resume your normal diet the day after surgery. 5. Most patients will experience some swelling and bruising in the breast.  Ice packs and a good support bra will help.  Swelling and bruising can take several days to resolve.  6. It is common to experience some constipation if taking pain medication after surgery.  Increasing fluid intake and taking a stool softener will usually help or prevent this problem from occurring.  A mild laxative (Milk of Magnesia or Miralax) should be taken according to package directions if there are no bowel movements after 48 hours. 7. Unless discharge instructions indicate otherwise, you may remove your bandages 24-48 hours after surgery, and you may shower at that time.  You may have steri-strips (small skin tapes) in place directly over the incision.  These strips should be left on the skin for 7-10 days.  If your surgeon used skin  glue on the incision, you may shower in 24 hours.  The glue will flake off over the next 2-3 weeks.  Any sutures or staples will be removed at the office during your follow-up visit. 8. ACTIVITIES:  You may resume regular daily activities (gradually increasing) beginning the next day.  Wearing a good support bra or sports bra minimizes pain and swelling.  You may have sexual intercourse when it is comfortable. a. You may drive when you no longer are taking prescription pain medication, you can comfortably wear a seatbelt, and you can safely maneuver your car and apply brakes. b. RETURN TO WORK:  ______________________________________________________________________________________ 9. You should see your doctor in the office for a follow-up appointment approximately two weeks after your surgery.  Your doctors nurse will typically make your follow-up appointment when she calls you with your pathology report.  Expect your pathology report 2-3 business days after your surgery.  You may call to check if you do not hear from [place] after three days. 10. OTHER INSTRUCTIONS: _______________________________________________________________________________________________ _____________________________________________________________________________________________________________________________________ _____________________________________________________________________________________________________________________________________ _____________________________________________________________________________________________________________________________________  WHEN TO CALL YOUR DOCTOR: 1. Fever over 101.0 2. Nausea and/or vomiting. 3. Extreme swelling or bruising. 4. Continued bleeding from incision. 5. Increased pain, redness, or drainage from the incision.  The clinic staff is available to answer your questions during regular business hours.  Please dont hesitate to call and ask to speak to one of the  nurses for clinical concerns.  If you have a medical emergency, go to the nearest emergency room or call 911.  A surgeon from [facility] is  always on call at the hospital.  For further questions, please visit [email or web address]    Post Anesthesia Home Care Instructions  Activity: Get plenty of rest for the remainder of the day. A responsible individual must stay with you for 24 hours following the procedure.  For the next 24 hours, DO NOT: -Drive a car -Paediatric nurse -Drink alcoholic beverages -Take any medication unless instructed by your physician -Make any legal decisions or sign important papers.  Meals: Start with liquid foods such as gelatin or soup. Progress to regular foods as tolerated. Avoid greasy, spicy, heavy foods. If nausea and/or vomiting occur, drink only clear liquids until the nausea and/or vomiting subsides. Call your physician if vomiting continues.  Special Instructions/Symptoms: Your throat may feel dry or sore from the anesthesia or the breathing tube placed in your throat during surgery. If this causes discomfort, gargle with warm salt water. The discomfort should disappear within 24 hours.  If you had a scopolamine patch placed behind your ear for the management of post- operative nausea and/or vomiting:  1. The medication in the patch is effective for 72 hours, after which it should be removed.  Wrap patch in a tissue and discard in the trash. Wash hands thoroughly with soap and water. 2. You may remove the patch earlier than 72 hours if you experience unpleasant side effects which may include dry mouth, dizziness or visual disturbances. 3. Avoid touching the patch. Wash your hands with soap and water after contact with the patch.

## 2017-08-31 NOTE — Interval H&P Note (Signed)
History and Physical Interval Note:  08/31/2017 8:21 AM  Caroline Williams  has presented today for surgery, with the diagnosis of RECURRENT RIGHT BREAST ABSCESS  The various methods of treatment have been discussed with the patient and family. After consideration of risks, benefits and other options for treatment, the patient has consented to  Procedure(s): RIGHT BREAST CENTRAL DUCT EXCISION (Right) as a surgical intervention .  The patient's history has been reviewed, patient examined, no change in status, stable for surgery.  I have reviewed the patient's chart and labs.  Questions were answered to the patient's satisfaction.     [place]

## 2017-08-31 NOTE — Anesthesia Preprocedure Evaluation (Addendum)
Anesthesia Evaluation  Patient identified by MRN, date of birth, ID band Patient awake    Reviewed: Allergy & Precautions, NPO status , Patient's Chart, lab work & pertinent test results  Airway Mallampati: II  TM Distance: >3 FB Neck ROM: Full    Dental no notable dental hx.    Pulmonary asthma , sleep apnea , former smoker,    Pulmonary exam normal breath sounds clear to auscultation       Cardiovascular hypertension, Normal cardiovascular exam Rhythm:Regular Rate:Normal     Neuro/Psych Bipolar Disorder Schizophrenia    GI/Hepatic GERD  ,  Endo/Other  diabetes  Renal/GU      Musculoskeletal   Abdominal (+) + obese,   Peds  Hematology   Anesthesia Other Findings   Reproductive/Obstetrics                            Lab Results  Component Value Date   CREATININE 0.97 08/30/2017   BUN 8 08/30/2017   NA 142 08/30/2017   K 3.6 08/30/2017   CL 108 08/30/2017   CO2 26 08/30/2017   Lab Results  Component Value Date   WBC 6.9 03/12/2015   HGB 11.3 (L) 03/12/2015   HCT 34.3 (L) 03/12/2015   MCV 79.2 03/12/2015   PLT 143 (L) 03/12/2015     Anesthesia Physical Anesthesia Plan  ASA: III  Anesthesia Plan: Regional and General   Post-op Pain Management:    Induction:   PONV Risk Score and Plan: 3 and Treatment may vary due to age or medical condition, Ondansetron, Midazolam and Dexamethasone  Airway Management Planned: LMA and Oral ETT  Additional Equipment:   Intra-op Plan:   Post-operative Plan:   Informed Consent: I have reviewed the patients History and Physical, chart, labs and discussed the procedure including the risks, benefits and alternatives for the proposed anesthesia with the patient or authorized representative who has indicated his/her understanding and acceptance.     Plan Discussed with: CRNA  Anesthesia Plan Comments:         Anesthesia Quick  Evaluation

## 2017-08-31 NOTE — Anesthesia Postprocedure Evaluation (Signed)
Anesthesia Post Note  Patient: Caroline Williams  Procedure(s) Performed: RIGHT BREAST CENTRAL DUCT EXCISION (Right Breast)     Patient location during evaluation: PACU Anesthesia Type: Regional Level of consciousness: awake and alert Pain management: pain level controlled Vital Signs Assessment: post-procedure vital signs reviewed and stable Respiratory status: spontaneous breathing, nonlabored ventilation, respiratory function stable and patient connected to nasal cannula oxygen Cardiovascular status: blood pressure returned to baseline and stable Postop Assessment: no apparent nausea or vomiting Anesthetic complications: no    Last Vitals:  Vitals:   08/31/17 1030 08/31/17 1047  BP: (!) 192/114 (!) 171/108  Pulse: 70 74  Resp: 10 12  Temp:    SpO2: 95% 94%    Last Pain:  Vitals:   08/31/17 1047  TempSrc:   PainSc: Harper A Houser

## 2017-08-31 NOTE — Anesthesia Procedure Notes (Signed)
Procedure Name: LMA Insertion Date/Time: 08/31/2017 8:57 AM Performed by: Raenette Rover, CRNA Pre-anesthesia Checklist: Patient identified, Emergency Drugs available, Suction available and Patient being monitored Patient Re-evaluated:Patient Re-evaluated prior to induction Oxygen Delivery Method: Circle system utilized Preoxygenation: Pre-oxygenation with 100% oxygen Induction Type: IV induction LMA: LMA inserted LMA Size: 4.0 Number of attempts: 1 Placement Confirmation: positive ETCO2,  CO2 detector and breath sounds checked- equal and bilateral Tube secured with: Tape Dental Injury: Teeth and Oropharynx as per pre-operative assessment

## 2017-08-31 NOTE — Transfer of Care (Signed)
Immediate Anesthesia Transfer of Care Note  Patient: Caroline Williams  Procedure(s) Performed: RIGHT BREAST CENTRAL DUCT EXCISION (Right Breast)  Patient Location: PACU  Anesthesia Type:General  Level of Consciousness: drowsy  Airway & Oxygen Therapy: Patient Spontanous Breathing and Patient connected to face mask oxygen  Post-op Assessment: Report given to RN and Post -op Vital signs reviewed and stable  Post vital signs: Reviewed and stable  Last Vitals:  Vitals:   08/31/17 0713 08/31/17 0941  BP: 134/74 (!) 185/108  Pulse: 72 99  Resp: 18 12  Temp: 36.4 C   SpO2: 97% 100%    Last Pain:  Vitals:   08/31/17 0713  TempSrc: Oral  PainSc: 7          Complications: No apparent anesthesia complications

## 2017-09-01 ENCOUNTER — Encounter (HOSPITAL_BASED_OUTPATIENT_CLINIC_OR_DEPARTMENT_OTHER): Payer: Self-pay | Admitting: Surgery

## 2017-09-06 ENCOUNTER — Ambulatory Visit: Payer: Medicare HMO | Admitting: *Deleted

## 2017-09-07 ENCOUNTER — Encounter: Payer: Medicare HMO | Attending: Internal Medicine | Admitting: Skilled Nursing Facility1

## 2017-09-07 ENCOUNTER — Encounter: Payer: Self-pay | Admitting: Skilled Nursing Facility1

## 2017-09-07 DIAGNOSIS — E119 Type 2 diabetes mellitus without complications: Secondary | ICD-10-CM

## 2017-09-07 DIAGNOSIS — E785 Hyperlipidemia, unspecified: Secondary | ICD-10-CM | POA: Insufficient documentation

## 2017-09-07 DIAGNOSIS — E669 Obesity, unspecified: Secondary | ICD-10-CM | POA: Insufficient documentation

## 2017-09-07 DIAGNOSIS — E1165 Type 2 diabetes mellitus with hyperglycemia: Secondary | ICD-10-CM | POA: Insufficient documentation

## 2017-09-07 DIAGNOSIS — Z713 Dietary counseling and surveillance: Secondary | ICD-10-CM | POA: Diagnosis not present
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
## 2017-09-07 NOTE — Patient Instructions (Addendum)
-  Take the appropriate multivitamin and calcium   -15 grams CHO and double digits of protein for yogurt  -Work on hunger verses appetite  -Eat within an hour to hour and a half of waking: eat every 3-5 hours after that  -Create complete meals  -Take your multivitamin 2 hours from the calcium and each calcium 2 hours apart

## 2017-09-13 ENCOUNTER — Ambulatory Visit (HOSPITAL_COMMUNITY)
Admission: EM | Admit: 2017-09-13 | Discharge: 2017-09-13 | Disposition: A | Discharge status: Home / Self Care | Payer: Medicare HMO | Attending: Family Medicine | Admitting: Family Medicine

## 2017-09-13 ENCOUNTER — Other Ambulatory Visit: Payer: Self-pay

## 2017-09-13 ENCOUNTER — Ambulatory Visit (INDEPENDENT_AMBULATORY_CARE_PROVIDER_SITE_OTHER): Payer: Medicare HMO

## 2017-09-13 ENCOUNTER — Encounter (HOSPITAL_COMMUNITY): Payer: Self-pay | Admitting: Emergency Medicine

## 2017-09-13 DIAGNOSIS — G8929 Other chronic pain: Secondary | ICD-10-CM | POA: Diagnosis not present

## 2017-09-13 DIAGNOSIS — M25561 Pain in right knee: Secondary | ICD-10-CM

## 2017-09-13 DIAGNOSIS — W108XXA Fall (on) (from) other stairs and steps, initial encounter: Secondary | ICD-10-CM

## 2017-09-13 MED ORDER — KETOROLAC TROMETHAMINE 60 MG/2ML IM SOLN
60.0000 mg | Freq: Once | INTRAMUSCULAR | Status: AC
  Administered 2017-09-13: 60 mg via INTRAMUSCULAR

## 2017-09-13 MED ORDER — PREDNISONE 10 MG (21) PO TBPK: ORAL_TABLET | ORAL | 0 refills | Status: DC

## 2017-09-13 MED ORDER — KETOROLAC TROMETHAMINE 60 MG/2ML IM SOLN
INTRAMUSCULAR | Status: AC
  Filled 2017-09-13: qty 2

## 2017-09-13 NOTE — Discharge Instructions (Addendum)
I have send the prednisone to your pharmacy.  Please refill your pain medicine tomorrow and take as directed.  Please follow up with your orthopedic specialist as scheduled for your knee.

## 2017-09-13 NOTE — ED Triage Notes (Signed)
Pt states she has had two right knee replacements and has chronic knee pain.  She states she gets an injection in the knee and it helps the pain.  She states she fell down some stairs on Friday and now has pain in the knee.

## 2017-09-13 NOTE — ED Provider Notes (Signed)
[email or web address]  [place]    CSN: [id number] Arrival date & time: 09/13/17  1305     History   Chief Complaint Chief Complaint  Patient presents with  . Knee Pain    right    HPI Caroline Williams is a 57 y.o. female.   Presents today for acute flare up of her chronic knee pain after she fell down 8 concrete steps 5 days ago. She has hx of osteoarthritis s/p total right knee replacement in 2011 and revision in 2011. She is current under pain management taking morphine and oxycodone. She is worry about her knee due to the fall and patient is out of her pain medicine but will be able to get refills tomorrow. She would like a shot of Toradol and some prednisone to help her today. Pain is 10/10. Endorses difficulty walking.      Past Medical History:  Diagnosis Date  . Anxiety   . Arthritis   . Asthma   . Bipolar 1 disorder ([place])   . Breast discharge   . Breast lump   . Breast pain   . Chronic pain    on MS Contin and oxycodone  . Depression   . Diabetes mellitus   . Fever   . GERD (gastroesophageal reflux disease)   . History of knee replacement, total   . Hypertension   . Hypothyroidism   . Pneumonia    2015,2014  . Sleep apnea    does not use CPAP  . Thyroid disease     Patient Active Problem List   Diagnosis Date Noted  . Chest pain 01/26/2016  . Sepsis due to Escherichia coli ([place])   . E coli bacteremia   . E. coli UTI   . Hallucination, drug-induced ([place]) 03/12/2015  . Leukocytosis 03/12/2015  . Hypokalemia 03/12/2015  . Hypothyroidism 03/12/2015  . UTI (urinary tract infection) 03/12/2015  . Sepsis due to Gram negative bacteria ([place])   . Gram-negative bacteremia 03/11/2015  . Lap gastric bypass June 2016 11/25/2014  . Diabetes mellitus with neuropathy ([place]) 06/20/2013  . Chronic pain of right knee 06/20/2013  . Morbid obesity ([place]) 07/05/2012    Past Surgical History:  Procedure Laterality Date  . ABDOMINAL HYSTERECTOMY     partial  .  APPENDECTOMY    . BREAST DUCTAL SYSTEM EXCISION Right 08/31/2017   Procedure: RIGHT BREAST CENTRAL DUCT EXCISION;  Surgeon: Erroll Luna, MD;  Location: [facility];  Service: General;  Laterality: Right;  . BREAST EXCISIONAL BIOPSY Right   . BREAST LUMPECTOMY     right  . CARDIAC CATHETERIZATION     no significant CAD, nl LV function by 11/08/06 cath  . CESAREAN SECTION    . GASTRIC ROUX-EN-Y N/A 11/25/2014   Procedure: LAPAROSCOPIC ROUX-EN-Y GASTRIC BYPASS WITH UPPER ENDOSCOPY;  Surgeon: Johnathan Hausen, MD;  Location: [facility];  Service: General;  Laterality: N/A;  . HERNIA REPAIR    . JOINT REPLACEMENT    . KNEE SURGERY     right  . [place]  . [place]  . TOTAL KNEE REVISION  06/21/2012   Procedure: TOTAL KNEE REVISION;  Surgeon: Newt Minion, MD;  Location: [facility];  Service: Orthopedics;  Laterality: Right;  Revision Right Total Knee Arthroplasty    OB History   None      Home Medications    Prior to Admission medications   Medication Sig Start Date End Date Taking?  Authorizing Provider  ADVAIR DISKUS 500-50 MCG/DOSE AEPB Inhale 1 puff into the lungs 2 (two) times daily as needed (shortness of breath, wheezing).  12/05/13  Yes [provider]  albuterol (PROVENTIL HFA;VENTOLIN HFA) 108 (90 BASE) MCG/ACT inhaler Inhale 2 puffs into the lungs every 6 (six) hours as needed for wheezing or shortness of breath. Shortness of breath   Yes [provider]  ALPRAZolam (XANAX) 1 MG tablet Take 1 mg by mouth 4 (four) times daily.  07/02/14  Yes [provider]  amLODipine (NORVASC) 5 MG tablet Take 5 mg by mouth daily. 06/15/16  Yes [provider]  amphetamine-dextroamphetamine (ADDERALL XR) 30 MG 24 hr capsule Take 30 mg by mouth daily. 06/25/16  Yes [provider]  amphetamine-dextroamphetamine (ADDERALL) 10 MG tablet Take 10 mg by mouth 2 (two) times daily. 06/25/16  Yes [provider]  cyclobenzaprine (FLEXERIL) 10 MG tablet Take 1 tablet (10 mg total) by mouth 2 (two) times daily as needed for muscle spasms (back pain). 12/21/15  Yes Schorr, Rhetta Mura, NP  esomeprazole (NEXIUM) 40 MG capsule Take 40 mg by mouth daily at 12 noon.   Yes [provider]  fluticasone (FLONASE) 50 MCG/ACT nasal spray Place 2 sprays into the nose daily as needed (congestion).   Yes [provider]  gabapentin (NEURONTIN) 600 MG tablet Take 600 mg by mouth 2 (two) times daily.   Yes [provider]  hydrochlorothiazide (MICROZIDE) 12.5 MG capsule TAKE ONE CAPSULE BY MOUTH DAILY 06/08/17  Yes Belva Crome, MD  HYDROcodone-acetaminophen (NORCO/VICODIN) 5-325 MG tablet Take 1 tablet by mouth every 6 (six) hours as needed for moderate pain. 08/31/17  Yes Cornett, Marcello Moores, MD  levocetirizine (XYZAL) 5 MG tablet Take 5 mg by mouth daily as needed for allergies.  06/29/14  Yes [provider]  levothyroxine (SYNTHROID, LEVOTHROID) 112 MCG tablet Take 112 mcg by mouth daily. 08/09/16  Yes [provider]  lurasidone (LATUDA) 40 MG TABS tablet Take 40 mg by mouth daily with breakfast.   Yes [provider]  morphine (MS CONTIN) 15 MG 12 hr tablet Take 1 tablet (15 mg total) by mouth every 12 (twelve) hours. 11/14/14  Yes Bayard Hugger, NP  Multiple Vitamin (MULTIVITAMIN) tablet Take 1 tablet by mouth every morning.    Yes [provider]  nabumetone (RELAFEN) 750 MG tablet TAKE 1 TABLET BY MOUTH TWICE DAILY AS NEEDED FOR INFLAMMATION/PAIN 09/14/16  Yes Newt Minion, MD  Oxycodone HCl 10 MG TABS Take 1 tablet (10 mg total) by mouth every 6 (six) hours as needed (for pain). 11/14/14  Yes Bayard Hugger, NP  VOLTAREN 1 % GEL Apply 1 application topically 4 (four) times daily as needed (pain).  01/23/15  Yes [provider]  EPINEPHrine 0.3 mg/0.3 mL IJ SOAJ injection Inject 0.3 mg as directed as needed (severe allergic reaction).   01/11/14   [provider]  nitroGLYCERIN (NITROSTAT) 0.4 MG SL tablet DISSOLVE 1 TABLET UNDER THE TONGUE EVERY 5 MINUTES AS NEEDED Patient taking differently: DISSOLVE 1 TABLET UNDER THE TONGUE EVERY 5 MINUTES AS NEEDED FOR CHEST PAIN 01/27/16   Belva Crome, MD  predniSONE (STERAPRED UNI-PAK 21 TAB) 10 MG (21) TBPK tablet Take as directed 09/13/17   Barry Dienes, NP  LISINOPRIL PO Take by mouth daily.    09/06/11  [provider]    Family History Family History  Problem Relation Age of Onset  . Cancer Father  colon  . Stroke Father   . Heart disease Father   . Diabetes Father   . Hypertension Father   . Depression Sister   . Hypertension Mother     Social History Social History   Tobacco Use  . Smoking status: Former Smoker    Packs/day: 0.50    Years: 4.00    Pack years: 2.00    Types: Cigarettes    Last attempt to quit: 08/15/2017    Years since quitting: 0.0  . Smokeless tobacco: Never Used  Substance Use Topics  . Alcohol use: Yes    Comment: social  . Drug use: No     Allergies   Aspirin; Sulfa antibiotics; and Other   Review of Systems Review of Systems  Constitutional:       See HPI     Physical Exam Triage Vital Signs ED Triage Vitals  Enc Vitals Group     BP 09/13/17 1321 (!) 152/87     Pulse Rate 09/13/17 1321 89     Resp --      Temp 09/13/17 1321 97.8 F (36.6 C)     Temp Source 09/13/17 1321 Oral     SpO2 09/13/17 1321 99 %     Weight --      Height --      Head Circumference --      Peak Flow --      Pain Score 09/13/17 1319 8     Pain Loc --      Pain Edu? --      Excl. in [place]? --    No data found.  Updated Vital Signs BP (!) 152/87 (BP Location: Right Arm)   Pulse 89   Temp 97.8 F (36.6 C) (Oral)   SpO2 99%     Physical Exam  Constitutional: She is oriented to person, place, and time. She appears well-developed and well-nourished.  Cardiovascular: Normal rate, regular rhythm and normal heart sounds.    Pulmonary/Chest: Effort normal and breath sounds normal. She has no wheezes.  Musculoskeletal:  Crepitus noted on left knee.  Patient refused exam on right knee due to pain  Neurological: She is alert and oriented to person, place, and time.  Nursing note and vitals reviewed.    UC Treatments / Results  Labs (all labs ordered are listed, but only abnormal results are displayed) Labs Reviewed - No data to display  EKG None Radiology Dg Knee Complete 4 Views Right  Result Date: 09/13/2017 CLINICAL DATA:  Recent fall several days ago with persistent right knee pain, initial encounter EXAM: RIGHT KNEE - COMPLETE 4+ VIEW COMPARISON:  12/21/15 FINDINGS: Right knee prosthesis is again identified. Some dystrophic bony changes are noted about the knee joint stable from the prior exam. No definitive acute abnormality is noted. Persistent lucency surrounding the fixation cement is noted. IMPRESSION: Stable appearance from 2017.  No acute abnormality noted. Electronically Signed   By: Inez Catalina M.D.   On: 09/13/2017 14:01    Procedures Procedures (including critical care time)  Medications Ordered in UC Medications  ketorolac (TORADOL) injection 60 mg (60 mg Intramuscular Given 09/13/17 1339)     Initial Impression / Assessment and Plan / UC Course  I have reviewed the triage vital signs and the nursing notes.  Pertinent labs & imaging results that were available during my care of the patient were reviewed by me and considered in my medical decision making (see chart for details).  Final Clinical Impressions(s) /  UC Diagnoses   Final diagnoses:  Chronic pain of right knee   I have send the prednisone to your pharmacy. Please take that as directed. Please refill your pain medicine tomorrow. Please follow up with your orthopedic specialist as scheduled for your knee.   ED Discharge Orders        Ordered    predniSONE (STERAPRED UNI-PAK 21 TAB) 10 MG (21) TBPK tablet     09/13/17  1357     Controlled Substance Prescriptions [place] Controlled Substance Registry consulted? Yes   Barry Dienes, NP 09/13/17 1408

## 2017-09-14 DIAGNOSIS — F4001 Agoraphobia with panic disorder: Secondary | ICD-10-CM | POA: Diagnosis not present

## 2017-09-14 DIAGNOSIS — F250 Schizoaffective disorder, bipolar type: Secondary | ICD-10-CM | POA: Diagnosis not present

## 2017-09-20 DIAGNOSIS — M47816 Spondylosis without myelopathy or radiculopathy, lumbar region: Secondary | ICD-10-CM | POA: Diagnosis not present

## 2017-09-20 DIAGNOSIS — M25561 Pain in right knee: Secondary | ICD-10-CM | POA: Diagnosis not present

## 2017-09-22 DIAGNOSIS — M25531 Pain in right wrist: Secondary | ICD-10-CM | POA: Diagnosis not present

## 2017-09-22 DIAGNOSIS — M1712 Unilateral primary osteoarthritis, left knee: Secondary | ICD-10-CM | POA: Diagnosis not present

## 2017-09-22 DIAGNOSIS — Z96651 Presence of right artificial knee joint: Secondary | ICD-10-CM | POA: Diagnosis not present

## 2017-10-10 DIAGNOSIS — M47816 Spondylosis without myelopathy or radiculopathy, lumbar region: Secondary | ICD-10-CM | POA: Diagnosis not present

## 2017-10-11 DIAGNOSIS — F900 Attention-deficit hyperactivity disorder, predominantly inattentive type: Secondary | ICD-10-CM | POA: Diagnosis not present

## 2017-10-11 DIAGNOSIS — F250 Schizoaffective disorder, bipolar type: Secondary | ICD-10-CM | POA: Diagnosis not present

## 2017-10-11 DIAGNOSIS — F4001 Agoraphobia with panic disorder: Secondary | ICD-10-CM | POA: Diagnosis not present

## 2017-10-13 ENCOUNTER — Ambulatory Visit: Payer: Medicare HMO | Admitting: Skilled Nursing Facility1

## 2017-10-13 DIAGNOSIS — Z23 Encounter for immunization: Secondary | ICD-10-CM | POA: Diagnosis not present

## 2017-10-13 DIAGNOSIS — E119 Type 2 diabetes mellitus without complications: Secondary | ICD-10-CM | POA: Diagnosis not present

## 2017-10-17 DIAGNOSIS — M47816 Spondylosis without myelopathy or radiculopathy, lumbar region: Secondary | ICD-10-CM | POA: Diagnosis not present

## 2017-10-27 DIAGNOSIS — F250 Schizoaffective disorder, bipolar type: Secondary | ICD-10-CM | POA: Diagnosis not present

## 2017-10-27 DIAGNOSIS — F423 Hoarding disorder: Secondary | ICD-10-CM | POA: Diagnosis not present

## 2017-11-03 ENCOUNTER — Ambulatory Visit: Payer: Medicare HMO | Admitting: Skilled Nursing Facility1

## 2017-11-04 DIAGNOSIS — I10 Essential (primary) hypertension: Secondary | ICD-10-CM | POA: Diagnosis not present

## 2017-11-04 DIAGNOSIS — R509 Fever, unspecified: Secondary | ICD-10-CM | POA: Diagnosis not present

## 2017-11-04 DIAGNOSIS — R112 Nausea with vomiting, unspecified: Secondary | ICD-10-CM | POA: Diagnosis not present

## 2017-11-04 DIAGNOSIS — R630 Anorexia: Secondary | ICD-10-CM | POA: Diagnosis not present

## 2017-11-08 ENCOUNTER — Encounter (HOSPITAL_COMMUNITY): Payer: Self-pay | Admitting: Emergency Medicine

## 2017-11-08 ENCOUNTER — Other Ambulatory Visit: Payer: Self-pay

## 2017-11-08 ENCOUNTER — Emergency Department (HOSPITAL_COMMUNITY)
Admission: EM | Admit: 2017-11-08 | Discharge: 2017-11-09 | Disposition: A | Discharge status: Home / Self Care | Payer: Medicare HMO | Attending: Emergency Medicine | Admitting: Emergency Medicine

## 2017-11-08 DIAGNOSIS — Z96651 Presence of right artificial knee joint: Secondary | ICD-10-CM | POA: Diagnosis not present

## 2017-11-08 DIAGNOSIS — Z79899 Other long term (current) drug therapy: Secondary | ICD-10-CM | POA: Insufficient documentation

## 2017-11-08 DIAGNOSIS — E039 Hypothyroidism, unspecified: Secondary | ICD-10-CM | POA: Diagnosis not present

## 2017-11-08 DIAGNOSIS — J45909 Unspecified asthma, uncomplicated: Secondary | ICD-10-CM | POA: Diagnosis not present

## 2017-11-08 DIAGNOSIS — E876 Hypokalemia: Secondary | ICD-10-CM | POA: Insufficient documentation

## 2017-11-08 DIAGNOSIS — R000 Tachycardia, unspecified: Secondary | ICD-10-CM | POA: Diagnosis not present

## 2017-11-08 DIAGNOSIS — R112 Nausea with vomiting, unspecified: Secondary | ICD-10-CM | POA: Diagnosis not present

## 2017-11-08 DIAGNOSIS — E119 Type 2 diabetes mellitus without complications: Secondary | ICD-10-CM | POA: Insufficient documentation

## 2017-11-08 DIAGNOSIS — R197 Diarrhea, unspecified: Secondary | ICD-10-CM | POA: Insufficient documentation

## 2017-11-08 DIAGNOSIS — R1110 Vomiting, unspecified: Secondary | ICD-10-CM | POA: Insufficient documentation

## 2017-11-08 DIAGNOSIS — Z87891 Personal history of nicotine dependence: Secondary | ICD-10-CM | POA: Diagnosis not present

## 2017-11-08 DIAGNOSIS — R1111 Vomiting without nausea: Secondary | ICD-10-CM | POA: Diagnosis not present

## 2017-11-08 DIAGNOSIS — E860 Dehydration: Secondary | ICD-10-CM

## 2017-11-08 DIAGNOSIS — I10 Essential (primary) hypertension: Secondary | ICD-10-CM | POA: Diagnosis not present

## 2017-11-08 LAB — CBC
HCT: 48.5 % — ABNORMAL HIGH (ref 36.0–46.0)
Hemoglobin: 16.4 g/dL — ABNORMAL HIGH (ref 12.0–15.0)
MCH: 28.2 pg (ref 26.0–34.0)
MCHC: 33.8 g/dL (ref 30.0–36.0)
MCV: 83.3 fL (ref 78.0–100.0)
Platelets: 248 10*3/uL (ref 150–400)
RBC: 5.82 MIL/uL — ABNORMAL HIGH (ref 3.87–5.11)
RDW: 16.2 % — ABNORMAL HIGH (ref 11.5–15.5)
WBC: 5.4 10*3/uL (ref 4.0–10.5)

## 2017-11-08 LAB — LIPASE, BLOOD: Lipase: 29 U/L (ref 11–51)

## 2017-11-08 LAB — COMPREHENSIVE METABOLIC PANEL
ALT: 22 U/L (ref 14–54)
AST: 20 U/L (ref 15–41)
Albumin: 4.7 g/dL (ref 3.5–5.0)
Alkaline Phosphatase: 137 U/L — ABNORMAL HIGH (ref 38–126)
Anion gap: 11 (ref 5–15)
BUN: 12 mg/dL (ref 6–20)
CO2: 24 mmol/L (ref 22–32)
Calcium: 9.6 mg/dL (ref 8.9–10.3)
Chloride: 104 mmol/L (ref 101–111)
Creatinine, Ser: 1.05 mg/dL — ABNORMAL HIGH (ref 0.44–1.00)
GFR calc Af Amer: >60 mL/min (ref >60)
GFR calc non Af Amer: 58 mL/min — ABNORMAL LOW (ref >60)
Glucose, Bld: 110 mg/dL — ABNORMAL HIGH (ref 65–99)
Potassium: 2.8 mmol/L — ABNORMAL LOW (ref 3.5–5.1)
Sodium: 139 mmol/L (ref 135–145)
Total Bilirubin: 0.7 mg/dL (ref 0.3–1.2)
Total Protein: 8.5 g/dL — ABNORMAL HIGH (ref 6.5–8.1)

## 2017-11-08 LAB — CBG MONITORING, ED: Glucose-Capillary: 105 mg/dL — ABNORMAL HIGH (ref 65–99)

## 2017-11-08 MED ORDER — POTASSIUM CHLORIDE CRYS ER 20 MEQ PO TBCR
40.0000 meq | EXTENDED_RELEASE_TABLET | Freq: Once | ORAL | Status: AC
  Administered 2017-11-08: 40 meq via ORAL
  Filled 2017-11-08: qty 2

## 2017-11-08 MED ORDER — POTASSIUM CHLORIDE 10 MEQ/100ML IV SOLN
10.0000 meq | INTRAVENOUS | Status: AC
  Administered 2017-11-08: 10 meq via INTRAVENOUS
  Filled 2017-11-08: qty 100

## 2017-11-08 MED ORDER — POTASSIUM CHLORIDE CRYS ER 20 MEQ PO TBCR: 20.0000 meq | EXTENDED_RELEASE_TABLET | Freq: Every day | ORAL | 0 refills | Status: DC

## 2017-11-08 MED ORDER — ONDANSETRON HCL 4 MG/2ML IJ SOLN
4.0000 mg | Freq: Once | INTRAMUSCULAR | Status: AC
  Administered 2017-11-08: 4 mg via INTRAVENOUS
  Filled 2017-11-08: qty 2

## 2017-11-08 MED ORDER — SODIUM CHLORIDE 0.9 % IV BOLUS
1000.0000 mL | Freq: Once | INTRAVENOUS | Status: AC
  Administered 2017-11-08: 1000 mL via INTRAVENOUS

## 2017-11-08 MED ORDER — ONDANSETRON 4 MG PO TBDP: ORAL_TABLET | ORAL | 0 refills | Status: DC

## 2017-11-08 NOTE — ED Notes (Signed)
Pt d/c home with family members, teach back completed I will continue to monitor.

## 2017-11-08 NOTE — ED Notes (Signed)
Lab notified new bld was sent down to run previous orders.

## 2017-11-08 NOTE — ED Notes (Signed)
Pt states she is still unable to provide urine specimen.

## 2017-11-08 NOTE — ED Notes (Signed)
Pt refuses to have vitals rechecked. Says she'll wait until she's in a room

## 2017-11-08 NOTE — ED Notes (Signed)
Patient denies pain and is resting comfortably.  

## 2017-11-08 NOTE — ED Notes (Addendum)
Came to pt's room to complete EKG and orthostatic vital signs; pt on phone and is not interested in ending phone call. Will complete once pt has finished phone call.

## 2017-11-08 NOTE — ED Notes (Signed)
Collected CBG, per RN.

## 2017-11-08 NOTE — ED Notes (Signed)
Dr. Lita Mains made aware of pt's orthostatic vital signs.

## 2017-11-08 NOTE — ED Triage Notes (Addendum)
Per EMS-states N/V/D for 5 days-states last time she vomited was at 1200-no abdominal pain

## 2017-11-08 NOTE — ED Provider Notes (Signed)
[facility] DEPT Provider Note   CSN: [id number] Arrival date & time: 11/08/17  1319     History   Chief Complaint Chief Complaint  Patient presents with  . Nausea  . Emesis    HPI Caroline Williams is a 57 y.o. female.  HPI Patient presents with vomiting and diarrhea for the past 6 days.  She had a hamburger the day before symptoms started thinks this may be the cause of her symptoms.  Was seen by her primary doctor 4 days ago and started on Cipro.  Also given prescription for Phenergan.  Patient states she is had persistent symptoms though they have improved.  One episode of vomiting today and 2 episodes of loose stool.  No blood in either.  No fever or chills.  Patient had near syncopal episode today while at work which prompted her visit to the emergency department.  She thinks she may be dehydrated.  Denies any pain. Past Medical History:  Diagnosis Date  . Anxiety   . Arthritis   . Asthma   . Bipolar 1 disorder ([place])   . Breast discharge   . Breast lump   . Breast pain   . Chronic pain    on MS Contin and oxycodone  . Depression   . Diabetes mellitus   . Fever   . GERD (gastroesophageal reflux disease)   . History of knee replacement, total   . Hypertension   . Hypothyroidism   . Pneumonia    2015,2014  . Sleep apnea    does not use CPAP  . Thyroid disease     Patient Active Problem List   Diagnosis Date Noted  . Chest pain 01/26/2016  . Sepsis due to Escherichia coli ([place])   . E coli bacteremia   . E. coli UTI   . Hallucination, drug-induced ([place]) 03/12/2015  . Leukocytosis 03/12/2015  . Hypokalemia 03/12/2015  . Hypothyroidism 03/12/2015  . UTI (urinary tract infection) 03/12/2015  . Sepsis due to Gram negative bacteria ([place])   . Gram-negative bacteremia 03/11/2015  . Lap gastric bypass June 2016 11/25/2014  . Diabetes mellitus with neuropathy ([place]) 06/20/2013  . Chronic pain of right knee 06/20/2013  . Morbid obesity  ([place]) 07/05/2012    Past Surgical History:  Procedure Laterality Date  . ABDOMINAL HYSTERECTOMY     partial  . APPENDECTOMY    . BREAST DUCTAL SYSTEM EXCISION Right 08/31/2017   Procedure: RIGHT BREAST CENTRAL DUCT EXCISION;  Surgeon: Erroll Luna, MD;  Location: [facility];  Service: General;  Laterality: Right;  . BREAST EXCISIONAL BIOPSY Right   . BREAST LUMPECTOMY     right  . CARDIAC CATHETERIZATION     no significant CAD, nl LV function by 11/08/06 cath  . CESAREAN SECTION    . GASTRIC ROUX-EN-Y N/A 11/25/2014   Procedure: LAPAROSCOPIC ROUX-EN-Y GASTRIC BYPASS WITH UPPER ENDOSCOPY;  Surgeon: Johnathan Hausen, MD;  Location[facility];  Service: General;  Laterality: N/A;  . HERNIA REPAIR    . JOINT REPLACEMENT    . KNEE SURGERY     right  . [place]  . [place]  . TOTAL KNEE REVISION  06/21/2012   Procedure: TOTAL KNEE REVISION;  Surgeon: Newt Minion, MD;  Location: [facility];  Service: Orthopedics;  Laterality: Right;  Revision Right Total Knee Arthroplasty     OB History   None      Home Medications  Prior to Admission medications   Medication Sig Start Date End Date Taking? Authorizing Provider  albuterol (PROVENTIL HFA;VENTOLIN HFA) 108 (90 BASE) MCG/ACT inhaler Inhale 2 puffs into the lungs every 6 (six) hours as needed for wheezing or shortness of breath. Shortness of breath   Yes [provider]  ALPRAZolam (XANAX) 1 MG tablet Take 1 mg by mouth 4 (four) times daily.  07/02/14  Yes [provider]  amLODipine (NORVASC) 5 MG tablet Take 5 mg by mouth daily. 06/15/16  Yes [provider]  amphetamine-dextroamphetamine (ADDERALL) 20 MG tablet Take 20 mg by mouth 2 (two) times daily.  10/13/17  Yes [provider]  buPROPion (WELLBUTRIN XL) 300 MG 24 hr tablet Take 300 mg by mouth daily.   Yes [provider]  ciprofloxacin (CIPRO) 500 MG tablet TK 1 T PO QD FOR 7  DAYS 11/04/17  Yes [provider]  fluticasone (FLONASE) 50 MCG/ACT nasal spray Place 2 sprays into the nose daily as needed (congestion).   Yes [provider]  levothyroxine (SYNTHROID, LEVOTHROID) 112 MCG tablet Take 112 mcg by mouth daily. 08/09/16  Yes [provider]  lurasidone (LATUDA) 40 MG TABS tablet Take 40 mg by mouth at bedtime.    Yes [provider]  LYRICA 75 MG capsule TK ONE C PO BID 08/18/17  Yes [provider]  morphine (MS CONTIN) 15 MG 12 hr tablet Take 1 tablet (15 mg total) by mouth every 12 (twelve) hours. 11/14/14  Yes Bayard Hugger, NP  Multiple Vitamin (MULTIVITAMIN) tablet Take 1 tablet by mouth every morning.    Yes [provider]  nabumetone (RELAFEN) 750 MG tablet TAKE 1 TABLET BY MOUTH TWICE DAILY AS NEEDED FOR INFLAMMATION/PAIN Patient taking differently: TAKE 1 TABLET BY MOUTH TWICE DAILY FOR PAIN AND INFLAMMATION 09/14/16  Yes Newt Minion, MD  omeprazole (PRILOSEC) 40 MG capsule TK ONE C PO  QD TO REDUCE STOMACH ACID 08/14/17  Yes [provider]  Oxycodone HCl 10 MG TABS Take 1 tablet (10 mg total) by mouth every 6 (six) hours as needed (for pain). 11/14/14  Yes Bayard Hugger, NP  promethazine (PHENERGAN) 12.5 MG tablet TK 1 TO 2 TS PO Q 6 H FOR 3 DAYS PRN FOR NAUSEA AND VOMITING 11/04/17  Yes [provider]  VOLTAREN 1 % GEL Apply 1 application topically 4 (four) times daily.  01/23/15  Yes [provider]  cyclobenzaprine (FLEXERIL) 10 MG tablet Take 1 tablet (10 mg total) by mouth 2 (two) times daily as needed for muscle spasms (back pain). Patient not taking: Reported on 11/08/2017 12/21/15   Schorr, Rhetta Mura, NP  EPINEPHrine 0.3 mg/0.3 mL IJ SOAJ injection Inject 0.3 mg as directed as needed (severe allergic reaction).  01/11/14   [provider]  hydrochlorothiazide (MICROZIDE) 12.5 MG capsule TAKE ONE CAPSULE BY MOUTH DAILY Patient not taking: Reported on 11/08/2017  06/08/17   Belva Crome, MD  HYDROcodone-acetaminophen (NORCO/VICODIN) 5-325 MG tablet Take 1 tablet by mouth every 6 (six) hours as needed for moderate pain. Patient not taking: Reported on 11/08/2017 08/31/17   Erroll Luna, MD  nitroGLYCERIN (NITROSTAT) 0.4 MG SL tablet DISSOLVE 1 TABLET UNDER THE TONGUE EVERY 5 MINUTES AS NEEDED Patient taking differently: DISSOLVE 1 TABLET UNDER THE TONGUE EVERY 5 MINUTES AS NEEDED FOR CHEST PAIN 01/27/16   Belva Crome, MD  ondansetron (ZOFRAN ODT) 4 MG disintegrating tablet 4mg  ODT q4 hours prn nausea/vomit 11/08/17  Julianne Rice, MD  potassium chloride SA (K-DUR,KLOR-CON) 20 MEQ tablet Take 1 tablet (20 mEq total) by mouth daily. 11/09/17   Julianne Rice, MD  predniSONE (STERAPRED UNI-PAK 21 TAB) 10 MG (21) TBPK tablet Take as directed Patient not taking: Reported on 11/08/2017 09/13/17   Barry Dienes, NP  LISINOPRIL PO Take by mouth daily.    09/06/11  [provider]    Family History Family History  Problem Relation Age of Onset  . Cancer Father        colon  . Stroke Father   . Heart disease Father   . Diabetes Father   . Hypertension Father   . Depression Sister   . Hypertension Mother     Social History Social History   Tobacco Use  . Smoking status: Former Smoker    Packs/day: 0.50    Years: 4.00    Pack years: 2.00    Types: Cigarettes    Last attempt to quit: 08/15/2017    Years since quitting: 0.2  . Smokeless tobacco: Never Used  Substance Use Topics  . Alcohol use: Yes    Comment: social  . Drug use: No     Allergies   Aspirin; Sulfa antibiotics; and Other   Review of Systems Review of Systems  Constitutional: Negative for chills, fatigue and fever.  HENT: Negative for sore throat and trouble swallowing.   Eyes: Negative for visual disturbance.  Respiratory: Negative for cough, shortness of breath and wheezing.   Cardiovascular: Negative for chest pain, palpitations and leg swelling.    Gastrointestinal: Positive for diarrhea, nausea and vomiting. Negative for abdominal distention, abdominal pain and constipation.  Genitourinary: Negative for dysuria, flank pain and frequency.  Musculoskeletal: Negative for arthralgias, back pain, joint swelling, myalgias and neck pain.  Skin: Negative for rash and wound.  Neurological: Positive for syncope and light-headedness. Negative for dizziness, weakness, numbness and headaches.  All other systems reviewed and are negative.    Physical Exam Updated Vital Signs BP 132/78 (BP Location: Right Arm)   Pulse 68   Temp 98 F (36.7 C) (Oral)   Resp 13   Ht 5\' 5"  (1.651 m)   Wt 107.1 kg (236 lb 1 oz)   SpO2 97%   BMI 39.28 kg/m   Physical Exam  Constitutional: She is oriented to person, place, and time. She appears well-developed and well-nourished. No distress.  HENT:  Head: Normocephalic and atraumatic.  Mouth/Throat: Oropharynx is clear and moist. No oropharyngeal exudate.  Eyes: Pupils are equal, round, and reactive to light. EOM are normal.  Neck: Normal range of motion. Neck supple. No JVD present.  Cardiovascular: Normal rate and regular rhythm. Exam reveals no gallop and no friction rub.  No murmur heard. Pulmonary/Chest: Effort normal and breath sounds normal. No stridor. No respiratory distress. She has no wheezes. She has no rales. She exhibits no tenderness.  Abdominal: Soft. Bowel sounds are normal. There is no tenderness. There is no rebound and no guarding.  Musculoskeletal: Normal range of motion. She exhibits no edema or tenderness.  Lymphadenopathy:    She has no cervical adenopathy.  Neurological: She is alert and oriented to person, place, and time.  Moves all extremities without focal deficit.  Sensation fully intact.  Skin: Skin is warm. Capillary refill takes less than 2 seconds. No rash noted. She is not diaphoretic. No erythema.  Psychiatric: She has a normal mood and affect. Her behavior is normal.   Nursing note and vitals reviewed.  ED Treatments / Results  Labs (all labs ordered are listed, but only abnormal results are displayed) Labs Reviewed  COMPREHENSIVE METABOLIC PANEL - Abnormal; Notable for the following components:      Result Value   Potassium 2.8 (*)    Glucose, Bld 110 (*)    Creatinine, Ser 1.05 (*)    Total Protein 8.5 (*)    Alkaline Phosphatase 137 (*)    GFR calc non Af Amer 58 (*)    All other components within normal limits  CBC - Abnormal; Notable for the following components:   RBC 5.82 (*)    Hemoglobin 16.4 (*)    HCT 48.5 (*)    RDW 16.2 (*)    All other components within normal limits  CBG MONITORING, ED - Abnormal; Notable for the following components:   Glucose-Capillary 105 (*)    All other components within normal limits  LIPASE, BLOOD    EKG EKG Interpretation  Date/Time:  Tuesday Nov 08 2017 19:31:05 EDT Ventricular Rate:  68 PR Interval:    QRS Duration: 102 QT Interval:  423 QTC Calculation: 450 R Axis:   -15 Text Interpretation:  Sinus rhythm Consider left atrial enlargement Borderline left axis deviation Low voltage, precordial leads Confirmed by Julianne Rice [phone]) on 11/08/2017 10:10:34 PM   Radiology No results found.  Procedures Procedures (including critical care time)  Medications Ordered in ED Medications  potassium chloride 10 mEq in 100 mL IVPB (0 mEq Intravenous Stopped 11/08/17 2150)  sodium chloride 0.9 % bolus 1,000 mL (0 mLs Intravenous Stopped 11/08/17 2014)  ondansetron (ZOFRAN) injection 4 mg (4 mg Intravenous Given 11/08/17 2006)  potassium chloride SA (K-DUR,KLOR-CON) CR tablet 40 mEq (40 mEq Oral Given 11/08/17 2006)     Initial Impression / Assessment and Plan / ED Course  I have reviewed the triage vital signs and the nursing notes.  Pertinent labs & imaging results that were available during my care of the patient were reviewed by me and considered in my medical decision making (see chart  for details).    Patient is feeling better after IV fluids and potassium replacement.  No vomiting in the emergency department.  Tolerating oral intake.  Abdominal exam is benign.  Likely foodborne illness versus viral gastroenteritis.  Return precautions given.   Final Clinical Impressions(s) / ED Diagnoses   Final diagnoses:  Vomiting and diarrhea  Hypokalemia  Dehydration    ED Discharge Orders        Ordered    potassium chloride SA (K-DUR,KLOR-CON) 20 MEQ tablet  Daily     11/08/17 2148    ondansetron (ZOFRAN ODT) 4 MG disintegrating tablet     11/08/17 2148       Julianne Rice, MD 11/08/17 2211

## 2017-11-08 NOTE — ED Notes (Signed)
Pt alert x 4, n/v/d x 1 week after eating a hamburger at home. Pt states she went to her PCP and was diagnosed with Ecoli and placed on po Cipro. Bolus started po/iv potassium given I will continue to monitor.

## 2017-11-08 NOTE — ED Triage Notes (Signed)
Pt c/o N/V/D since Wednesday after eating a hamburger Tuesday night. Denies abdominal pain and says that she had a 103.0 fever on Wednesday.

## 2017-11-09 NOTE — ED Notes (Signed)
Patient denies pain and is resting comfortably.  

## 2017-12-02 ENCOUNTER — Encounter (HOSPITAL_COMMUNITY): Payer: Self-pay | Admitting: *Deleted

## 2017-12-02 ENCOUNTER — Other Ambulatory Visit: Payer: Self-pay

## 2017-12-02 ENCOUNTER — Ambulatory Visit (HOSPITAL_COMMUNITY)
Admission: EM | Admit: 2017-12-02 | Discharge: 2017-12-02 | Disposition: A | Discharge status: Home / Self Care | Payer: Medicare HMO | Attending: Family Medicine | Admitting: Family Medicine

## 2017-12-02 DIAGNOSIS — R110 Nausea: Secondary | ICD-10-CM | POA: Diagnosis not present

## 2017-12-02 HISTORY — DX: Other bacterial infections of unspecified site: A49.8

## 2017-12-02 MED ORDER — ONDANSETRON HCL 8 MG PO TABS: 8.0000 mg | ORAL_TABLET | Freq: Three times a day (TID) | ORAL | 0 refills | Status: DC | PRN

## 2017-12-02 NOTE — ED Triage Notes (Addendum)
States was treated for E Coli approx 3 wks ago, but "I still cannot keep everything down and having nausea" ever since, therefore not eating.  Reports 22 lb weight loss.  No f/u since hospital visit.  Has hx gastric bypass.  Denies any diarrhea.

## 2017-12-02 NOTE — ED Provider Notes (Signed)
[place]    CSN: [id number] Arrival date & time: 12/02/17  1537     History   Chief Complaint Chief Complaint  Patient presents with  . Nausea    HPI Caroline Williams is a 57 y.o. female.   HPI  Patient developed nausea vomiting and diarrhea after eating a hamburger.  Her primary care doctor thought she might have a toxic E. coli.  He gave her a course of Cipro.  Because of persistent symptoms she went to the emergency department 3 weeks ago.  She was given IV fluids, potassium repletion, and a prescription for Zofran.  She felt better briefly but is here because she continues to have nausea and vomiting.  She is concerned because she is unable to keep down much solid food.  She can  drink liquids.  She feels like she is keeping down most of her medication.  She is feeling somewhat weak and lack of energy.  No fever or chills.  No more diarrhea.  No abdominal cramping or pain.  She is concerned because she had a gastric bypass years ago.  She thinks that the E. coli may have "damaged" her intestine so that she cannot eat anymore.  She wants to know whether additional testing is indicated.  She is scheduled to see her primary care doctor on Monday.  No dizziness.  No syncope.  Past Medical History:  Diagnosis Date  . Anxiety   . Arthritis   . Asthma   . Bipolar 1 disorder ([place])   . Breast discharge   . Breast lump   . Breast pain   . Chronic pain    on MS Contin and oxycodone  . Depression   . Diabetes mellitus   . Escherichia coli (E. coli) infection   . Fever   . GERD (gastroesophageal reflux disease)   . History of knee replacement, total   . Hypertension   . Hypothyroidism   . Pneumonia    2015,2014  . Sleep apnea    does not use CPAP  . Thyroid disease     Patient Active Problem List   Diagnosis Date Noted  . Chest pain 01/26/2016  . Sepsis due to Escherichia coli ([place])   . E coli bacteremia   . E. coli UTI   . Hallucination, drug-induced ([place])  03/12/2015  . Leukocytosis 03/12/2015  . Hypokalemia 03/12/2015  . Hypothyroidism 03/12/2015  . UTI (urinary tract infection) 03/12/2015  . Sepsis due to Gram negative bacteria ([place])   . Gram-negative bacteremia 03/11/2015  . Lap gastric bypass June 2016 11/25/2014  . Diabetes mellitus with neuropathy ([place]) 06/20/2013  . Chronic pain of right knee 06/20/2013  . Morbid obesity ([place]) 07/05/2012    Past Surgical History:  Procedure Laterality Date  . ABDOMINAL HYSTERECTOMY     partial  . APPENDECTOMY    . BREAST DUCTAL SYSTEM EXCISION Right 08/31/2017   Procedure: RIGHT BREAST CENTRAL DUCT EXCISION;  Surgeon: Erroll Luna, MD;  Location: [facility];  Service: General;  Laterality: Right;  . BREAST EXCISIONAL BIOPSY Right   . BREAST LUMPECTOMY     right  . CARDIAC CATHETERIZATION     no significant CAD, nl LV function by 11/08/06 cath  . CESAREAN SECTION    . GASTRIC ROUX-EN-Y N/A 11/25/2014   Procedure: LAPAROSCOPIC ROUX-EN-Y GASTRIC BYPASS WITH UPPER ENDOSCOPY;  Surgeon: Johnathan Hausen, MD;  [facility];  Service: General;  Laterality: N/A;  . HERNIA REPAIR    .  JOINT REPLACEMENT    . KNEE SURGERY     right  . [place]  . [place]  . TOTAL KNEE REVISION  06/21/2012   Procedure: TOTAL KNEE REVISION;  Surgeon: Newt Minion, MD;  Location: [facility];  Service: Orthopedics;  Laterality: Right;  Revision Right Total Knee Arthroplasty    OB History   None      Home Medications    Prior to Admission medications   Medication Sig Start Date End Date Taking? Authorizing Provider  ALPRAZolam Duanne Moron) 1 MG tablet Take 1 mg by mouth 4 (four) times daily.  07/02/14  Yes [provider]  amLODipine (NORVASC) 5 MG tablet Take 5 mg by mouth daily. 06/15/16  Yes [provider]  amphetamine-dextroamphetamine (ADDERALL) 20 MG tablet Take 20 mg by mouth 2 (two) times daily.  10/13/17  Yes [provider]  buPROPion (WELLBUTRIN XL) 300 MG 24 hr tablet Take 300 mg by mouth daily.   Yes [provider]  levothyroxine (SYNTHROID, LEVOTHROID) 112 MCG tablet Take 112 mcg by mouth daily. 08/09/16  Yes [provider]  lurasidone (LATUDA) 40 MG TABS tablet Take 40 mg by mouth at bedtime.    Yes [provider]  LYRICA 75 MG capsule TK ONE C PO BID 08/18/17  Yes [provider]  morphine (MS CONTIN) 15 MG 12 hr tablet Take 1 tablet (15 mg total) by mouth every 12 (twelve) hours. 11/14/14  Yes Bayard Hugger, NP  Multiple Vitamin (MULTIVITAMIN) tablet Take 1 tablet by mouth every morning.    Yes [provider]  nabumetone (RELAFEN) 750 MG tablet TAKE 1 TABLET BY MOUTH TWICE DAILY AS NEEDED FOR INFLAMMATION/PAIN Patient taking differently: TAKE 1 TABLET BY MOUTH TWICE DAILY FOR PAIN AND INFLAMMATION 09/14/16  Yes Newt Minion, MD  omeprazole (PRILOSEC) 40 MG capsule TK ONE C PO  QD TO REDUCE STOMACH ACID 08/14/17  Yes [provider]  Oxycodone HCl 10 MG TABS Take 1 tablet (10 mg total) by mouth every 6 (six) hours as needed (for pain). 11/14/14  Yes Bayard Hugger, NP  albuterol (PROVENTIL HFA;VENTOLIN HFA) 108 (90 BASE) MCG/ACT inhaler Inhale 2 puffs into the lungs every 6 (six) hours as needed for wheezing or shortness of breath. Shortness of breath    [provider]  EPINEPHrine 0.3 mg/0.3 mL IJ SOAJ injection Inject 0.3 mg as directed as needed (severe allergic reaction).  01/11/14   [provider]  nitroGLYCERIN (NITROSTAT) 0.4 MG SL tablet DISSOLVE 1 TABLET UNDER THE TONGUE EVERY 5 MINUTES AS NEEDED Patient taking differently: DISSOLVE 1 TABLET UNDER THE TONGUE EVERY 5 MINUTES AS NEEDED FOR CHEST PAIN 01/27/16   Belva Crome, MD  ondansetron (ZOFRAN) 8 MG tablet Take 1 tablet (8 mg total) by mouth every 8 (eight) hours as needed for nausea or vomiting. 12/02/17   Raylene Everts, MD  VOLTAREN 1 % GEL Apply 1  application topically 4 (four) times daily.  01/23/15   [provider]  LISINOPRIL PO Take by mouth daily.    09/06/11  [provider]    Family History Family History  Problem Relation Age of Onset  . Cancer Father        colon  . Stroke Father   . Heart disease Father   . Diabetes Father   . Hypertension Father   . Depression Sister   . Hypertension Mother  Social History Social History   Tobacco Use  . Smoking status: Current Every Day Smoker    Packs/day: 0.50    Years: 4.00    Pack years: 2.00    Types: Cigarettes    Last attempt to quit: 08/15/2017    Years since quitting: 0.2  . Smokeless tobacco: Never Used  Substance Use Topics  . Alcohol use: Yes    Comment: social  . Drug use: No     Allergies   Aspirin; Sulfa antibiotics; and Other   Review of Systems Review of Systems  Constitutional: Negative for chills and fever.  HENT: Negative for congestion, ear pain and sore throat.   Eyes: Negative for pain and visual disturbance.  Respiratory: Negative for cough and shortness of breath.   Cardiovascular: Negative for chest pain and palpitations.  Gastrointestinal: Positive for nausea and vomiting. Negative for abdominal pain, blood in stool, constipation and diarrhea.  Genitourinary: Negative for dysuria and hematuria.  Musculoskeletal: Negative for arthralgias and back pain.  Skin: Negative for color change and rash.  Neurological: Negative for dizziness, seizures, syncope and light-headedness.  Psychiatric/Behavioral: Negative for dysphoric mood. The patient is not nervous/anxious.   All other systems reviewed and are negative.    Physical Exam Triage Vital Signs ED Triage Vitals  Enc Vitals Group     BP 12/02/17 1559 (!) 154/93     Pulse Rate 12/02/17 1559 84     Resp 12/02/17 1559 18     Temp 12/02/17 1559 98.2 F (36.8 C)     Temp Source 12/02/17 1559 Oral     SpO2 12/02/17 1559 98 %     Weight --      Height --       Head Circumference --      Peak Flow --      Pain Score 12/02/17 1601 0     Pain Loc --      Pain Edu? --      Excl. in [place]? --    No data found.  Updated Vital Signs BP (!) 154/93   Pulse 84   Temp 98.2 F (36.8 C) (Oral)   Resp 18   SpO2 98%   Physical Exam  Constitutional: She appears well-developed and well-nourished. No distress.  Pleasant.  Conversant.  No acute distress  HENT:  Head: Normocephalic and atraumatic.  Right Ear: External ear normal.  Left Ear: External ear normal.  Mouth/Throat: Oropharynx is clear and moist.  Appears well-hydrated  Eyes: Pupils are equal, round, and reactive to light. Conjunctivae are normal.  Neck: Normal range of motion.  Cardiovascular: Normal rate, regular rhythm and normal heart sounds.  Pulmonary/Chest: Effort normal and breath sounds normal. No respiratory distress. She has no wheezes.  Abdominal: Soft. Bowel sounds are normal. She exhibits no distension. There is tenderness.  Moderate midepigastric tenderness.  No organomegaly.  No guarding or rebound.  Musculoskeletal: Normal range of motion. She exhibits no edema.  Lymphadenopathy:    She has no cervical adenopathy.  Neurological: She is alert.  Skin: Skin is warm and dry.  Psychiatric: She has a normal mood and affect. Her behavior is normal.     UC Treatments / Results  Labs (all labs ordered are listed, but only abnormal results are displayed) Labs Reviewed - No data to display  EKG None  Radiology No results found.  Procedures Procedures (including critical care time)  Medications Ordered in UC Medications - No data to display  Initial Impression /  Assessment and Plan / UC Course  I have reviewed the triage vital signs and the nursing notes.  Pertinent labs & imaging results that were available during my care of the patient were reviewed by me and considered in my medical decision making (see chart for details).     Discussed that symptoms from a  gastroenteritis, especially bacteria can be prolonged.  Since she is able to keep down food and fluids when she is taking Zofran, I think the likelihood she has any "damage" her intestine is low.  I discussed with her that her potassium was low at her last visit and this needs to be repeated.  She plans to see her primary care doctor on Monday.  She states that they do blood work with her visits. I discussed with her bland diet.  Taking Zofran.  Trial of probiotics.  I do want her to hold her NSAID medication for a week until her stomach feels better.  Continue omeprazole.  Return as needed. Final Clinical Impressions(s) / UC Diagnoses   Final diagnoses:  Nausea     Discharge Instructions     Hold the nabumetone for a week Take your usual medicines Take the zofran as needed [place] diet Take a probiotic for a week Follow up with your [facility] doctor On Monday    ED Prescriptions    Medication Sig Dispense Auth. Provider   ondansetron (ZOFRAN) 8 MG tablet Take 1 tablet (8 mg total) by mouth every 8 (eight) hours as needed for nausea or vomiting. 20 tablet Raylene Everts, MD     Controlled Substance Prescriptions [place] Controlled Substance Registry consulted? Not Applicable   Raylene Everts, MD 12/02/17 1714

## 2017-12-02 NOTE — Discharge Instructions (Signed)
Hold the nabumetone for a week Take your usual medicines Take the zofran as needed [place] diet Take a probiotic for a week Follow up with your [facility] doctor On Monday

## 2017-12-05 DIAGNOSIS — R112 Nausea with vomiting, unspecified: Secondary | ICD-10-CM | POA: Diagnosis not present

## 2017-12-05 DIAGNOSIS — E876 Hypokalemia: Secondary | ICD-10-CM | POA: Diagnosis not present

## 2017-12-05 DIAGNOSIS — E119 Type 2 diabetes mellitus without complications: Secondary | ICD-10-CM | POA: Diagnosis not present

## 2017-12-05 DIAGNOSIS — M179 Osteoarthritis of knee, unspecified: Secondary | ICD-10-CM | POA: Diagnosis not present

## 2017-12-08 DIAGNOSIS — F900 Attention-deficit hyperactivity disorder, predominantly inattentive type: Secondary | ICD-10-CM | POA: Diagnosis not present

## 2017-12-08 DIAGNOSIS — F4001 Agoraphobia with panic disorder: Secondary | ICD-10-CM | POA: Diagnosis not present

## 2017-12-08 DIAGNOSIS — F250 Schizoaffective disorder, bipolar type: Secondary | ICD-10-CM | POA: Diagnosis not present

## 2017-12-13 DIAGNOSIS — F4001 Agoraphobia with panic disorder: Secondary | ICD-10-CM | POA: Diagnosis not present

## 2017-12-13 DIAGNOSIS — F250 Schizoaffective disorder, bipolar type: Secondary | ICD-10-CM | POA: Diagnosis not present

## 2017-12-22 DIAGNOSIS — K5900 Constipation, unspecified: Secondary | ICD-10-CM | POA: Diagnosis not present

## 2017-12-22 DIAGNOSIS — R194 Change in bowel habit: Secondary | ICD-10-CM | POA: Diagnosis not present

## 2017-12-25 ENCOUNTER — Ambulatory Visit (HOSPITAL_COMMUNITY)
Admission: EM | Admit: 2017-12-25 | Discharge: 2017-12-25 | Disposition: A | Discharge status: Home / Self Care | Payer: Medicare HMO

## 2017-12-25 ENCOUNTER — Encounter (HOSPITAL_COMMUNITY): Payer: Self-pay | Admitting: *Deleted

## 2017-12-25 DIAGNOSIS — M25561 Pain in right knee: Secondary | ICD-10-CM | POA: Diagnosis not present

## 2017-12-25 DIAGNOSIS — M25562 Pain in left knee: Secondary | ICD-10-CM

## 2017-12-25 DIAGNOSIS — G8929 Other chronic pain: Secondary | ICD-10-CM | POA: Diagnosis not present

## 2017-12-25 MED ORDER — KETOROLAC TROMETHAMINE 60 MG/2ML IM SOLN
60.0000 mg | Freq: Once | INTRAMUSCULAR | Status: AC
  Administered 2017-12-25: 60 mg via INTRAMUSCULAR

## 2017-12-25 MED ORDER — KETOROLAC TROMETHAMINE 60 MG/2ML IM SOLN
INTRAMUSCULAR | Status: AC
  Filled 2017-12-25: qty 2

## 2017-12-25 MED ORDER — PREDNISONE 20 MG PO TABS: ORAL_TABLET | ORAL | 0 refills | Status: AC

## 2017-12-25 MED ORDER — VOLTAREN 1 % TD GEL: 4.0000 g | Freq: Four times a day (QID) | TRANSDERMAL | 11 refills | Status: DC

## 2017-12-25 NOTE — ED Provider Notes (Signed)
12/25/2017 2:01 PM   DOB: 1961/06/08 / MRN: [id number]  SUBJECTIVE:  Caroline Williams is a 57 y.o. female presenting for bilataral chronic knee pain worse over the last few days.  Under pain contract and tells me pain is pushing through narcotics.  Cant walk at this time despite narcotic.  Requesting a shot of toradol and refill of diclofenac topical.  Hx of well controlled diabetes.   She is allergic to aspirin; sulfa antibiotics; and other.   She  has a past medical history of Anxiety, Arthritis, Asthma, Bipolar 1 disorder ([place]), Breast discharge, Breast lump, Breast pain, Chronic pain, Depression, Diabetes mellitus, Escherichia coli (E. coli) infection, Fever, GERD (gastroesophageal reflux disease), History of knee replacement, total, Hypertension, Hypothyroidism, Pneumonia, Sleep apnea, and Thyroid disease.    She  reports that she has been smoking cigarettes.  She has a 2.00 pack-year smoking history. She has never used smokeless tobacco. She reports that she drinks alcohol. She reports that she does not use drugs. She  has no sexual activity history on file. The patient  has a past surgical history that includes Cesarean section; Breast lumpectomy; Hernia repair; Appendectomy; Abdominal hysterectomy; Knee surgery; Myomectomy abdominal approach (1989); Myomectomy abdominal approach (1989); Cardiac catheterization; Total knee revision (06/21/2012); Gastric Roux-En-Y (N/A, 11/25/2014); Joint replacement; Breast excisional biopsy (Right); and Breast ductal system excision (Right, 08/31/2017).  Her family history includes Cancer in her father; Depression in her sister; Diabetes in her father; Heart disease in her father; Hypertension in her father and mother; Stroke in her father.  Review of Systems  Respiratory: Negative for cough and shortness of breath.   Cardiovascular: Negative for chest pain.  Genitourinary: Negative for dysuria, frequency and urgency.  Musculoskeletal: Positive for joint pain.  Negative for back pain, falls, myalgias and neck pain.    OBJECTIVE:  BP 135/79   Pulse 80   Temp 98 F (36.7 C) (Oral)   Resp 16   SpO2 99%   Wt Readings from Last 3 Encounters:  11/08/17 236 lb 1 oz (107.1 kg)  09/07/17 254 lb 12.8 oz (115.6 kg)  08/31/17 264 lb (119.7 kg)   Temp Readings from Last 3 Encounters:  12/25/17 98 F (36.7 C) (Oral)  12/02/17 98.2 F (36.8 C) (Oral)  11/08/17 98 F (36.7 C) (Oral)   BP Readings from Last 3 Encounters:  12/25/17 135/79  12/02/17 (!) 154/93  11/08/17 132/78   Pulse Readings from Last 3 Encounters:  12/25/17 80  12/02/17 84  11/08/17 81    Physical Exam  Constitutional: She is oriented to person, place, and time. She appears well-nourished. No distress.  Eyes: Pupils are equal, round, and reactive to light. EOM are normal.  Cardiovascular: Normal rate, regular rhythm, S1 normal, S2 normal, normal heart sounds and intact distal pulses. Exam reveals no gallop, no friction rub and no decreased pulses.  No murmur heard. Pulmonary/Chest: Effort normal. No stridor. No respiratory distress. She has no wheezes. She has no rales.  Abdominal: She exhibits no distension.  Musculoskeletal: She exhibits no edema.  Neurological: She is alert and oriented to person, place, and time. No cranial nerve deficit. Gait normal.  Skin: Skin is dry. She is not diaphoretic.  Psychiatric: She has a normal mood and affect.  Vitals reviewed.   Lab Results  Component Value Date   HGBA1C 6.4 (H) 03/12/2015   Lab Results  Component Value Date   CREATININE 1.05 (H) 11/08/2017      No results found for this  or any previous visit (from the past 72 hour(s)).  No results found.  ASSESSMENT AND PLAN:   Chronic pain of both knees - Pain pushing through chronic pain meds.  Patient requested treatment for arthritis flare here.  Hx of well controlled diabetes and medication compliant. Toradol, diclofenac gel, PO pred.  Follow up with Dr.  Alvan Dame.  Discharge Instructions   None        The patient is advised to call or return to clinic if she does not see an improvement in symptoms, or to seek the care of the closest emergency department if she worsens with the above plan.   Philis Fendt, MHS, PA-C 12/25/2017 2:01 PM   Tereasa Coop, PA-C 12/25/17 1401

## 2017-12-25 NOTE — Discharge Instructions (Signed)
I hope you today's plan gets you some relief.

## 2017-12-25 NOTE — ED Triage Notes (Signed)
C/O bilat knee pain x 3 days without injury.

## 2017-12-27 DIAGNOSIS — E1140 Type 2 diabetes mellitus with diabetic neuropathy, unspecified: Secondary | ICD-10-CM | POA: Diagnosis not present

## 2017-12-27 DIAGNOSIS — G5791 Unspecified mononeuropathy of right lower limb: Secondary | ICD-10-CM | POA: Diagnosis not present

## 2017-12-27 DIAGNOSIS — I10 Essential (primary) hypertension: Secondary | ICD-10-CM | POA: Diagnosis not present

## 2017-12-27 DIAGNOSIS — Z9884 Bariatric surgery status: Secondary | ICD-10-CM | POA: Diagnosis not present

## 2017-12-27 DIAGNOSIS — M545 Low back pain: Secondary | ICD-10-CM | POA: Diagnosis not present

## 2017-12-27 DIAGNOSIS — N951 Menopausal and female climacteric states: Secondary | ICD-10-CM | POA: Diagnosis not present

## 2018-01-09 ENCOUNTER — Encounter (HOSPITAL_COMMUNITY): Payer: Self-pay | Admitting: Emergency Medicine

## 2018-01-09 ENCOUNTER — Ambulatory Visit (HOSPITAL_COMMUNITY)
Admission: EM | Admit: 2018-01-09 | Discharge: 2018-01-09 | Disposition: A | Discharge status: Home / Self Care | Payer: Medicare HMO | Attending: Family Medicine | Admitting: Family Medicine

## 2018-01-09 DIAGNOSIS — R350 Frequency of micturition: Secondary | ICD-10-CM

## 2018-01-09 DIAGNOSIS — M25561 Pain in right knee: Secondary | ICD-10-CM

## 2018-01-09 DIAGNOSIS — G8929 Other chronic pain: Secondary | ICD-10-CM

## 2018-01-09 DIAGNOSIS — R300 Dysuria: Secondary | ICD-10-CM | POA: Diagnosis not present

## 2018-01-09 DIAGNOSIS — N390 Urinary tract infection, site not specified: Secondary | ICD-10-CM | POA: Diagnosis not present

## 2018-01-09 DIAGNOSIS — M25562 Pain in left knee: Secondary | ICD-10-CM

## 2018-01-09 LAB — POCT URINALYSIS DIP (DEVICE)
Bilirubin Urine: NEGATIVE
Glucose, UA: NEGATIVE mg/dL
Nitrite: NEGATIVE
Protein, ur: NEGATIVE mg/dL
Specific Gravity, Urine: 1.02 (ref 1.005–1.030)
Urobilinogen, UA: 0.2 mg/dL (ref 0.0–1.0)
pH: 6.5 (ref 5.0–8.0)

## 2018-01-09 MED ORDER — KETOROLAC TROMETHAMINE 60 MG/2ML IM SOLN
60.0000 mg | Freq: Once | INTRAMUSCULAR | Status: AC
  Administered 2018-01-09: 60 mg via INTRAMUSCULAR

## 2018-01-09 MED ORDER — PREDNISONE 10 MG (21) PO TBPK: ORAL_TABLET | ORAL | 0 refills | Status: DC

## 2018-01-09 MED ORDER — CEPHALEXIN 500 MG PO CAPS: 500.0000 mg | ORAL_CAPSULE | Freq: Four times a day (QID) | ORAL | 0 refills | Status: DC

## 2018-01-09 MED ORDER — FLUCONAZOLE 150 MG PO TABS: 150.0000 mg | ORAL_TABLET | Freq: Every day | ORAL | 0 refills | Status: DC

## 2018-01-09 MED ORDER — KETOROLAC TROMETHAMINE 60 MG/2ML IM SOLN
INTRAMUSCULAR | Status: AC
  Filled 2018-01-09: qty 2

## 2018-01-09 NOTE — ED Provider Notes (Signed)
[facility]    CSN: [id number] Arrival date & time: 01/09/18  1230     History   Chief Complaint Chief Complaint  Patient presents with  . Knee Pain  . Urinary Tract Infection    HPI Caroline Williams is a 57 y.o. female.   Patient is a 57 year old female with past medical history of anxiety, arthritis, asthma, bipolar, chronic knee pain, diabetes, GERD, hypertension, hypothyroid.  She presents with chronic bilateral knee pain but worse on the right.  This is been ongoing for months.  She was seen here on 12/30/2017 and given a shot of Toradol which she reports helps her pain but it does not last very long.  She felt better and the pain returned.  She has had some tenderness, swelling to the right medial knee.  She has been taking her morphine and oxycodone with minimal relief of the pain.  She is here today requesting a Toradol shot.  She is unable to take NSAIDs due to history of gastric bypass and irritation to stomach. She reports that she had a steroid injection about 4 months ago.   Patient is also here with 4 days of foul-smelling urine, frequency, dysuria, suprapubic pressure with urination.  She denies any fever, chills.  She denies any abdominal pain, back pain, flank pain.  She denies any vaginal discharge, itching, rashes, irritation, bleeding.  ROS per HPI      Past Medical History:  Diagnosis Date  . Anxiety   . Arthritis   . Asthma   . Bipolar 1 disorder ([place])   . Breast discharge   . Breast lump   . Breast pain   . Chronic pain    on MS Contin and oxycodone  . Depression   . Diabetes mellitus   . Escherichia coli (E. coli) infection   . Fever   . GERD (gastroesophageal reflux disease)   . History of knee replacement, total   . Hypertension   . Hypothyroidism   . Pneumonia    2015,2014  . Sleep apnea    does not use CPAP  . Thyroid disease     Patient Active Problem List   Diagnosis Date Noted  . Chest pain 01/26/2016  . Sepsis due to  Escherichia coli ([place])   . E coli bacteremia   . E. coli UTI   . Hallucination, drug-induced ([place]) 03/12/2015  . Leukocytosis 03/12/2015  . Hypokalemia 03/12/2015  . Hypothyroidism 03/12/2015  . UTI (urinary tract infection) 03/12/2015  . Sepsis due to Gram negative bacteria ([place])   . Gram-negative bacteremia 03/11/2015  . Lap gastric bypass June 2016 11/25/2014  . Diabetes mellitus with neuropathy ([place]) 06/20/2013  . Chronic pain of right knee 06/20/2013  . Morbid obesity ([place]) 07/05/2012    Past Surgical History:  Procedure Laterality Date  . ABDOMINAL HYSTERECTOMY     partial  . APPENDECTOMY    . BREAST DUCTAL SYSTEM EXCISION Right 08/31/2017   Procedure: RIGHT BREAST CENTRAL DUCT EXCISION;  Surgeon: Erroll Luna, MD;  Location: [facility];  Service: General;  Laterality: Right;  . BREAST EXCISIONAL BIOPSY Right   . BREAST LUMPECTOMY     right  . CARDIAC CATHETERIZATION     no significant CAD, nl LV function by 11/08/06 cath  . CESAREAN SECTION    . GASTRIC ROUX-EN-Y N/A 11/25/2014   Procedure: LAPAROSCOPIC ROUX-EN-Y GASTRIC BYPASS WITH UPPER ENDOSCOPY;  Surgeon: Johnathan Hausen, MD;  Location: [facility];  Service: General;  Laterality:  N/A;  . HERNIA REPAIR    . JOINT REPLACEMENT    . KNEE SURGERY     right  . [place]  . [place]  . TOTAL KNEE REVISION  06/21/2012   Procedure: TOTAL KNEE REVISION;  Surgeon: Newt Minion, MD;  Location: [facility];  Service: Orthopedics;  Laterality: Right;  Revision Right Total Knee Arthroplasty    OB History   None      Home Medications    Prior to Admission medications   Medication Sig Start Date End Date Taking? Authorizing Provider  albuterol (PROVENTIL HFA;VENTOLIN HFA) 108 (90 BASE) MCG/ACT inhaler Inhale 2 puffs into the lungs every 6 (six) hours as needed for wheezing or shortness of breath. Shortness of breath    [provider]  ALPRAZolam (XANAX) 1  MG tablet Take 1 mg by mouth 4 (four) times daily.  07/02/14   [provider]  amLODipine (NORVASC) 5 MG tablet Take 5 mg by mouth daily. 06/15/16   [provider]  amphetamine-dextroamphetamine (ADDERALL) 20 MG tablet Take 20 mg by mouth 2 (two) times daily.  10/13/17   [provider]  buPROPion (WELLBUTRIN XL) 300 MG 24 hr tablet Take 300 mg by mouth daily.    [provider]  cephALEXin (KEFLEX) 500 MG capsule Take 1 capsule (500 mg total) by mouth 4 (four) times daily. 01/09/18   Loura Halt A, NP  EPINEPHrine 0.3 mg/0.3 mL IJ SOAJ injection Inject 0.3 mg as directed as needed (severe allergic reaction).  01/11/14   [provider]  levothyroxine (SYNTHROID, LEVOTHROID) 112 MCG tablet Take 112 mcg by mouth daily. 08/09/16   [provider]  linaCLOtide (LINZESS PO) Take by mouth.    [provider]  lurasidone (LATUDA) 40 MG TABS tablet Take 40 mg by mouth at bedtime.     [provider]  LYRICA 75 MG capsule TK ONE C PO BID 08/18/17   [provider]  morphine (MS CONTIN) 15 MG 12 hr tablet Take 1 tablet (15 mg total) by mouth every 12 (twelve) hours. 11/14/14   Bayard Hugger, NP  Multiple Vitamin (MULTIVITAMIN) tablet Take 1 tablet by mouth every morning.     [provider]  nabumetone (RELAFEN) 750 MG tablet TAKE 1 TABLET BY MOUTH TWICE DAILY AS NEEDED FOR INFLAMMATION/PAIN Patient taking differently: TAKE 1 TABLET BY MOUTH TWICE DAILY FOR PAIN AND INFLAMMATION 09/14/16   Newt Minion, MD  nitroGLYCERIN (NITROSTAT) 0.4 MG SL tablet DISSOLVE 1 TABLET UNDER THE TONGUE EVERY 5 MINUTES AS NEEDED Patient taking differently: DISSOLVE 1 TABLET UNDER THE TONGUE EVERY 5 MINUTES AS NEEDED FOR CHEST PAIN 01/27/16   Belva Crome, MD  omeprazole (PRILOSEC) 40 MG capsule TK ONE C PO  QD TO REDUCE STOMACH ACID 08/14/17   [provider]  ondansetron (ZOFRAN) 8 MG tablet Take 1 tablet (8 mg total) by mouth every 8  (eight) hours as needed for nausea or vomiting. 12/02/17   Raylene Everts, MD  Oxycodone HCl 10 MG TABS Take 1 tablet (10 mg total) by mouth every 6 (six) hours as needed (for pain). 11/14/14   Bayard Hugger, NP  predniSONE (STERAPRED UNI-PAK 21 TAB) 10 MG (21) TBPK tablet 6 tabs for 1 day, then 5 tabs for 1 das, then 4 tabs for 1 day, then 3 tabs for 1 day, 2 tabs for 1 day, then 1 tab for 1 day 01/09/18  Everleigh Colclasure A, NP  VOLTAREN 1 % GEL Apply 4 g topically 4 (four) times daily. 12/25/17   Tereasa Coop, PA-C  LISINOPRIL PO Take by mouth daily.    09/06/11  [provider]    Family History Family History  Problem Relation Age of Onset  . Cancer Father        colon  . Stroke Father   . Heart disease Father   . Diabetes Father   . Hypertension Father   . Depression Sister   . Hypertension Mother     Social History Social History   Tobacco Use  . Smoking status: Current Every Day Smoker    Packs/day: 0.50    Years: 4.00    Pack years: 2.00    Types: Cigarettes    Last attempt to quit: 08/15/2017    Years since quitting: 0.4  . Smokeless tobacco: Never Used  Substance Use Topics  . Alcohol use: Yes    Comment: social  . Drug use: No     Allergies   Aspirin; Sulfa antibiotics; and Other   Review of Systems Review of Systems   Physical Exam Triage Vital Signs ED Triage Vitals  Enc Vitals Group     BP 01/09/18 1249 (!) 123/93     Pulse Rate 01/09/18 1249 96     Resp 01/09/18 1249 16     Temp 01/09/18 1249 98.5 F (36.9 C)     Temp Source 01/09/18 1249 Oral     SpO2 01/09/18 1249 96 %     Weight 01/09/18 1250 230 lb (104.3 kg)     Height --      Head Circumference --      Peak Flow --      Pain Score 01/09/18 1249 7     Pain Loc --      Pain Edu? --      Excl. in [place]? --    No data found.  Updated Vital Signs BP (!) 123/93   Pulse 96   Temp 98.5 F (36.9 C) (Oral)   Resp 16   Wt 230 lb (104.3 kg)   SpO2 96%   BMI 38.27 kg/m    Visual Acuity Right Eye Distance:   Left Eye Distance:   Bilateral Distance:    Right Eye Near:   Left Eye Near:    Bilateral Near:     Physical Exam  Constitutional: She is oriented to person, place, and time. She appears well-developed and well-nourished.  HENT:  Head: Normocephalic and atraumatic.  Pulmonary/Chest: Effort normal.  Abdominal: Soft.  Negative CVA tenderness.  Mild suprapubic tenderness.  No rebound.   Musculoskeletal:  Tenderness to palpation of the right medial knee with swelling.  No erythema, ecchymosis, deformity.  Limited range of motion especially with flexion of the knee due to pain.   Left knee non tender. Good ROM.   Neurological: She is alert and oriented to person, place, and time.  Skin: Skin is warm and dry. Capillary refill takes less than 2 seconds.  Psychiatric: She has a normal mood and affect.  Nursing note and vitals reviewed.    UC Treatments / Results  Labs (all labs ordered are listed, but only abnormal results are displayed) Labs Reviewed  POCT URINALYSIS DIP (DEVICE) - Abnormal; Notable for the following components:      Result Value   Ketones, ur TRACE (*)    Hgb urine dipstick TRACE (*)    Leukocytes, UA LARGE (*)  All other components within normal limits    EKG None  Radiology No results found.  Procedures Procedures (including critical care time)  Medications Ordered in UC Medications  ketorolac (TORADOL) injection 60 mg (has no administration in time range)    Initial Impression / Assessment and Plan / UC Course  I have reviewed the triage vital signs and the nursing notes.  Pertinent labs & imaging results that were available during my care of the patient were reviewed by me and considered in my medical decision making (see chart for details).     Urine positive for infection will send for culture.  Will treat with Keflex.  Toradol injection in clinic for knee pain and short burst of steroids for  inflammation.  Instructed to rest, ice, elevate and use knee sleeve she has at home.  Patient stated that she has taken steroids in the past and did not have any issues with her stomach.  Instructed patient that she needs to follow-up with her orthopedic for further management and possible steroid injection of the knee.  Patient agreeable to plan.  Final Clinical Impressions(s) / UC Diagnoses   Final diagnoses:  Lower urinary tract infectious disease  Chronic pain of both knees     Discharge Instructions     It was nice meeting you!!  Your urine was positive for infection we will treat that with antibiotics. Please you continue to drink plenty of water and stay hydrated We gave you a shot of Toradol in clinic today as requested for your knee pain.  We will give you a short burst of steroids to hopefully help with inflammation in your right knee.  Please follow-up with orthopedic for further management of knee pain.  You may warrant a knee injection in the next few months. Rest, ice, elevate and use the knee sleeve when up and moving around.   ED Prescriptions    Medication Sig Dispense Auth. Provider   predniSONE (STERAPRED UNI-PAK 21 TAB) 10 MG (21) TBPK tablet 6 tabs for 1 day, then 5 tabs for 1 das, then 4 tabs for 1 day, then 3 tabs for 1 day, 2 tabs for 1 day, then 1 tab for 1 day 21 tablet Elleni Mozingo A, NP   cephALEXin (KEFLEX) 500 MG capsule Take 1 capsule (500 mg total) by mouth 4 (four) times daily. 28 capsule Loura Halt A, NP     Controlled Substance Prescriptions [place] Controlled Substance Registry consulted? Not Applicable   Orvan July, NP 01/09/18 1447

## 2018-01-09 NOTE — Discharge Instructions (Addendum)
It was nice meeting you!!  Your urine was positive for infection we will treat that with antibiotics. Please you continue to drink plenty of water and stay hydrated We gave you a shot of Toradol in clinic today as requested for your knee pain.  We will give you a short burst of steroids to hopefully help with inflammation in your right knee.  Please follow-up with orthopedic for further management of knee pain.  You may warrant a knee injection in the next few months. Rest, ice, elevate and use the knee sleeve when up and moving around.

## 2018-01-09 NOTE — ED Triage Notes (Signed)
Chronic right knee pain, 2 replacements  Frequency, burning, for 4 days

## 2018-01-13 ENCOUNTER — Other Ambulatory Visit: Payer: Self-pay | Admitting: Gastroenterology

## 2018-01-24 ENCOUNTER — Other Ambulatory Visit: Payer: Self-pay | Admitting: Family Medicine

## 2018-01-24 DIAGNOSIS — N6001 Solitary cyst of right breast: Secondary | ICD-10-CM

## 2018-01-24 DIAGNOSIS — N611 Abscess of the breast and nipple: Secondary | ICD-10-CM

## 2018-01-26 ENCOUNTER — Ambulatory Visit
Admission: RE | Admit: 2018-01-26 | Discharge: 2018-01-26 | Disposition: A | Discharge status: Home / Self Care | Payer: Medicare HMO | Source: Ambulatory Visit | Attending: Family Medicine | Admitting: Family Medicine

## 2018-01-26 ENCOUNTER — Other Ambulatory Visit: Payer: Self-pay | Admitting: Family Medicine

## 2018-01-26 DIAGNOSIS — N611 Abscess of the breast and nipple: Secondary | ICD-10-CM

## 2018-01-27 ENCOUNTER — Other Ambulatory Visit: Payer: Self-pay | Admitting: Student

## 2018-01-27 DIAGNOSIS — N611 Abscess of the breast and nipple: Secondary | ICD-10-CM

## 2018-01-30 ENCOUNTER — Other Ambulatory Visit: Payer: Self-pay | Admitting: Student

## 2018-01-30 ENCOUNTER — Ambulatory Visit
Admission: RE | Admit: 2018-01-30 | Discharge: 2018-01-30 | Disposition: A | Discharge status: Home / Self Care | Payer: Medicare HMO | Source: Ambulatory Visit | Attending: Family Medicine | Admitting: Family Medicine

## 2018-01-30 DIAGNOSIS — N611 Abscess of the breast and nipple: Secondary | ICD-10-CM

## 2018-01-31 ENCOUNTER — Encounter: Payer: Medicare HMO | Attending: Internal Medicine | Admitting: Skilled Nursing Facility1

## 2018-01-31 ENCOUNTER — Encounter: Payer: Self-pay | Admitting: Skilled Nursing Facility1

## 2018-01-31 DIAGNOSIS — Z713 Dietary counseling and surveillance: Secondary | ICD-10-CM | POA: Diagnosis present

## 2018-01-31 DIAGNOSIS — Z9884 Bariatric surgery status: Secondary | ICD-10-CM | POA: Diagnosis present

## 2018-01-31 DIAGNOSIS — E1140 Type 2 diabetes mellitus with diabetic neuropathy, unspecified: Secondary | ICD-10-CM

## 2018-01-31 NOTE — Progress Notes (Signed)
Post-Operative RYGB Surgery  Medical Nutrition Therapy:  Appt start time: 1010 end time: 1050  Primary concerns today: Post-operative Bariatric Surgery Nutrition Management.  Pt states he has chronic knee pain. Pt states she had ecoli. Pt states she has had ecoli 3 separate times. Pt states he found she was only eating to sustain herself;f not eating out of comfort. Fasting readings: 155. Pt states she is still on antibiotics. Pt states she does a weight loss blog and had the dietitian take a picture of her on the scale. Pt state she works 2 jobs. Pt states she cannot eat 3 meals a day because she works 2 jobs. 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     Surgery date: 11/25/2014 Surgery type: RYGB Start weight at [facility]: 326.5 on 11/08/13 (330 lbs on day of surgery per patient) Weight today: 226.6  Goal weight: 190 lbs   TANITA  BODY COMP RESULTS  09/07/2017 01/31/2018   BMI (kg/m^2) 42.4 37.7   Fat Mass (lbs) 129.2 107.2   Fat Free Mass (lbs) 125.6 119.4   Total Body Water (lbs) 92 86.4    24-hr recall: skipping breakfast or dinner  Still has 2 protein shakes per day  B (6-6:30AM):half bagel with reduced fat cream cheese and herbal tea or cereal Snk (10:30 AM): nabs L (12 PM): 2 chicken wings and hash brown patty Snk (PM):  Honey bun D (6PM): salad and 1 drum stick or  Snk (PM):   Fluid intake: 48 oz water with flavorings, unsweet tea with Stevia, 11-22 oz protein shake (about 67 oz) Estimated total protein intake: 86g  Medications: no longer taking Metformin, blood pressure, or cholesterol meds Supplementation: taking liquid multivitamin: Pt was educate don this and advised to take the appropriate multivitamin   CBG monitoring: 2-3 times a day Average CBG per patient: 120-180 Last patient reported A1c: 6.1%  Using straws: no Drinking while eating: yes, patient states she knows this is not recommended Hair loss: none Carbonated beverages: none N/V/D/C: using  linzess Dumping syndrome: yes if she eats something "that does not belong in my body" like sweets   Recent physical activity:  Water aerobics and riding the bike: inconsistent   Progress Towards Goal(s):  In progress.  Handouts given during visit include:  Meal ideas  Should I eat  Low sodium seasoning options    Nutritional Diagnosis:  [place]-3.3 Overweight/obesity related to past poor dietary habits and physical inactivity as evidenced by patient w/ recent RYGB surgery following dietary guidelines for continued weight loss.  Intervention:  Nutrition counseling provided. Goals: -Take the appropriate multivitamin and calcium  -15 grams CHO and double digits of protein for yogurt -Work on hunger verses appetite -Eat within an hour to hour and a half of waking: eat every 3-5 hours after that -Create complete meals -Take your multivitamin 2 hours from the calcium and each calcium 2 hours apart  -Make an appt with your surgeon  -Aim to eat at least 3 meals a day -Try kefir  Teaching Method Utilized:  Visual Auditory Hands on  Barriers to learning/adherence to lifestyle change: none  Demonstrated degree of understanding via:  Teach Back   Monitoring/Evaluation:  Dietary intake, exercise, and body weight.

## 2018-02-02 LAB — AEROBIC/ANAEROBIC CULTURE (SURGICAL/DEEP WOUND)

## 2018-02-02 LAB — AEROBIC/ANAEROBIC CULTURE W GRAM STAIN (SURGICAL/DEEP WOUND): Special Requests: NORMAL

## 2018-02-03 ENCOUNTER — Ambulatory Visit
Admission: RE | Admit: 2018-02-03 | Discharge: 2018-02-03 | Disposition: A | Discharge status: Home / Self Care | Payer: Medicare HMO | Source: Ambulatory Visit | Attending: Student | Admitting: Student

## 2018-02-03 DIAGNOSIS — N611 Abscess of the breast and nipple: Secondary | ICD-10-CM

## 2018-03-07 ENCOUNTER — Other Ambulatory Visit: Payer: Self-pay | Admitting: Gastroenterology

## 2018-03-09 ENCOUNTER — Other Ambulatory Visit: Payer: Self-pay

## 2018-03-09 ENCOUNTER — Encounter (HOSPITAL_COMMUNITY): Payer: Self-pay

## 2018-03-14 ENCOUNTER — Ambulatory Visit (HOSPITAL_COMMUNITY)
Admission: RE | Admit: 2018-03-14 | Discharge: 2018-03-14 | Disposition: A | Discharge status: Home / Self Care | Payer: Medicare HMO | Source: Ambulatory Visit | Attending: Gastroenterology | Admitting: Gastroenterology

## 2018-03-14 ENCOUNTER — Ambulatory Visit (HOSPITAL_COMMUNITY): Payer: Medicare HMO | Admitting: Anesthesiology

## 2018-03-14 ENCOUNTER — Other Ambulatory Visit: Payer: Self-pay

## 2018-03-14 ENCOUNTER — Encounter (HOSPITAL_COMMUNITY)
Admission: RE | Disposition: A | Discharge status: Home / Self Care | Payer: Self-pay | Source: Ambulatory Visit | Attending: Gastroenterology

## 2018-03-14 ENCOUNTER — Encounter (HOSPITAL_COMMUNITY): Payer: Self-pay | Admitting: Emergency Medicine

## 2018-03-14 DIAGNOSIS — K640 First degree hemorrhoids: Secondary | ICD-10-CM | POA: Insufficient documentation

## 2018-03-14 DIAGNOSIS — K219 Gastro-esophageal reflux disease without esophagitis: Secondary | ICD-10-CM | POA: Diagnosis not present

## 2018-03-14 DIAGNOSIS — G8929 Other chronic pain: Secondary | ICD-10-CM | POA: Insufficient documentation

## 2018-03-14 DIAGNOSIS — E119 Type 2 diabetes mellitus without complications: Secondary | ICD-10-CM | POA: Diagnosis not present

## 2018-03-14 DIAGNOSIS — D1779 Benign lipomatous neoplasm of other sites: Secondary | ICD-10-CM | POA: Insufficient documentation

## 2018-03-14 DIAGNOSIS — D122 Benign neoplasm of ascending colon: Secondary | ICD-10-CM | POA: Insufficient documentation

## 2018-03-14 DIAGNOSIS — Z6837 Body mass index (BMI) 37.0-37.9, adult: Secondary | ICD-10-CM | POA: Insufficient documentation

## 2018-03-14 DIAGNOSIS — D123 Benign neoplasm of transverse colon: Secondary | ICD-10-CM | POA: Insufficient documentation

## 2018-03-14 DIAGNOSIS — F319 Bipolar disorder, unspecified: Secondary | ICD-10-CM | POA: Diagnosis not present

## 2018-03-14 DIAGNOSIS — K5900 Constipation, unspecified: Secondary | ICD-10-CM | POA: Insufficient documentation

## 2018-03-14 DIAGNOSIS — I10 Essential (primary) hypertension: Secondary | ICD-10-CM | POA: Insufficient documentation

## 2018-03-14 DIAGNOSIS — M1990 Unspecified osteoarthritis, unspecified site: Secondary | ICD-10-CM | POA: Diagnosis not present

## 2018-03-14 DIAGNOSIS — R194 Change in bowel habit: Secondary | ICD-10-CM | POA: Diagnosis present

## 2018-03-14 DIAGNOSIS — F17210 Nicotine dependence, cigarettes, uncomplicated: Secondary | ICD-10-CM | POA: Diagnosis not present

## 2018-03-14 DIAGNOSIS — F209 Schizophrenia, unspecified: Secondary | ICD-10-CM | POA: Insufficient documentation

## 2018-03-14 DIAGNOSIS — J449 Chronic obstructive pulmonary disease, unspecified: Secondary | ICD-10-CM | POA: Insufficient documentation

## 2018-03-14 HISTORY — PX: BIOPSY: SHX5522

## 2018-03-14 HISTORY — PX: COLONOSCOPY WITH PROPOFOL: SHX5780

## 2018-03-14 HISTORY — DX: Polyneuropathy, unspecified: G62.9

## 2018-03-14 HISTORY — DX: Personal history of urinary calculi: Z87.442

## 2018-03-14 HISTORY — DX: Nausea with vomiting, unspecified: R11.2

## 2018-03-14 HISTORY — DX: Presence of spectacles and contact lenses: Z97.3

## 2018-03-14 HISTORY — DX: Personal history of other specified conditions: Z87.898

## 2018-03-14 HISTORY — PX: POLYPECTOMY: SHX5525

## 2018-03-14 LAB — GLUCOSE, CAPILLARY: Glucose-Capillary: 77 mg/dL (ref 70–99)

## 2018-03-14 SURGERY — COLONOSCOPY WITH PROPOFOL
Anesthesia: Monitor Anesthesia Care

## 2018-03-14 MED ORDER — ONDANSETRON HCL 4 MG/2ML IJ SOLN
INTRAMUSCULAR | Status: DC | PRN
  Administered 2018-03-14: 4 mg via INTRAVENOUS

## 2018-03-14 MED ORDER — SODIUM CHLORIDE 0.9 % IV SOLN: INTRAVENOUS | Status: DC

## 2018-03-14 MED ORDER — PROPOFOL 10 MG/ML IV BOLUS
INTRAVENOUS | Status: AC
  Filled 2018-03-14: qty 60

## 2018-03-14 MED ORDER — PROPOFOL 500 MG/50ML IV EMUL
INTRAVENOUS | Status: DC | PRN
  Administered 2018-03-14: 120 ug/kg/min via INTRAVENOUS

## 2018-03-14 MED ORDER — PROPOFOL 10 MG/ML IV BOLUS
INTRAVENOUS | Status: DC | PRN
  Administered 2018-03-14 (×5): 20 mg via INTRAVENOUS

## 2018-03-14 MED ORDER — LIDOCAINE 2% (20 MG/ML) 5 ML SYRINGE
INTRAMUSCULAR | Status: DC | PRN
  Administered 2018-03-14: 100 mg via INTRAVENOUS

## 2018-03-14 MED ORDER — LACTATED RINGERS IV SOLN
INTRAVENOUS | Status: DC
  Administered 2018-03-14: 08:00:00 via INTRAVENOUS

## 2018-03-14 SURGICAL SUPPLY — 22 items

## 2018-03-14 NOTE — Interval H&P Note (Signed)
History and Physical Interval Note:  03/14/2018 8:20 AM  Caroline Williams  has presented today for surgery, with the diagnosis of constipation/change in bowel habits  The various methods of treatment have been discussed with the patient and family. After consideration of risks, benefits and other options for treatment, the patient has consented to  Procedure(s): COLONOSCOPY WITH PROPOFOL (N/A) as a surgical intervention .  The patient's history has been reviewed, patient examined, no change in status, stable for surgery.  I have reviewed the patient's chart and labs.  Questions were answered to the patient's satisfaction.     [place] C.

## 2018-03-14 NOTE — Transfer of Care (Signed)
Immediate Anesthesia Transfer of Care Note  Patient: Caroline Williams  Procedure(s) Performed: COLONOSCOPY WITH PROPOFOL (N/A ) BIOPSY POLYPECTOMY  Patient Location: Endoscopy Unit  Anesthesia Type:MAC  Level of Consciousness: awake  Airway & Oxygen Therapy: Patient Spontanous Breathing  Post-op Assessment: Report given to RN and Post -op Vital signs reviewed and stable  Post vital signs: Reviewed and stable  Last Vitals:  Vitals Value Taken Time  BP    Temp    Pulse    Resp    SpO2      Last Pain:  Vitals:   03/14/18 0726  TempSrc: Oral  PainSc: 0-No pain         Complications: No apparent anesthesia complications

## 2018-03-14 NOTE — Anesthesia Preprocedure Evaluation (Addendum)
Anesthesia Evaluation  Patient identified by MRN, date of birth, ID band Patient awake    Reviewed: Allergy & Precautions, NPO status , Patient's Chart, lab work & pertinent test results  History of Anesthesia Complications Negative for: history of anesthetic complications  Airway Mallampati: II  TM Distance: >3 FB Neck ROM: Full    Dental  (+) Dental Advisory Given, Missing   Pulmonary COPD,  COPD inhaler, Current Smoker,    breath sounds clear to auscultation       Cardiovascular hypertension, Pt. on medications (-) angina Rhythm:Regular Rate:Normal  '18 STRESS: EF 60%, normal wall motion. No evidence for ischemia or infarction by perfusion images   Neuro/Psych Anxiety Depression Bipolar Disorder Schizophrenia Chronic pain: narcotics    GI/Hepatic Neg liver ROS, GERD  Medicated and Controlled,  Endo/Other  diabetes (diet controlled), Oral Hypoglycemic AgentsHypothyroidism Morbid obesity  Renal/GU negative Renal ROS     Musculoskeletal  (+) Arthritis ,   Abdominal (+) + obese,   Peds  Hematology negative hematology ROS (+)   Anesthesia Other Findings   Reproductive/Obstetrics                            Anesthesia Physical Anesthesia Plan  ASA: III  Anesthesia Plan: MAC   Post-op Pain Management:    Induction:   PONV Risk Score and Plan: 1 and Treatment may vary due to age or medical condition and Ondansetron  Airway Management Planned: Natural Airway and Nasal Cannula  Additional Equipment:   Intra-op Plan:   Post-operative Plan:   Informed Consent: I have reviewed the patients History and Physical, chart, labs and discussed the procedure including the risks, benefits and alternatives for the proposed anesthesia with the patient or authorized representative who has indicated his/her understanding and acceptance.   Dental advisory given  Plan Discussed with: CRNA and  Surgeon  Anesthesia Plan Comments: (Plan routine monitors, MAC)        Anesthesia Quick Evaluation

## 2018-03-14 NOTE — Discharge Instructions (Signed)

## 2018-03-14 NOTE — Op Note (Signed)
[facility] Patient Name: Caroline Williams Procedure Date: 03/14/2018 MRN: [id number] Attending MD: Lear Ng , MD Date of Birth: 02-11-1961 CSN: [id number] Age: 57 Admit Type: Outpatient Procedure:                Colonoscopy Indications:              Last colonoscopy: May 2011, Change in bowel habits,                            Constipation Providers:                Lear Ng, MD, Vista Lawman, RN, Charolette Child, Technician, Danley Danker, CRNA Referring MD:              Medicines:                Propofol per Anesthesia, Monitored Anesthesia Care Complications:            No immediate complications. Estimated Blood Loss:     Estimated blood loss was minimal. Procedure:                Pre-Anesthesia Assessment:                           - Prior to the procedure, a History and Physical                            was performed, and patient medications and                            allergies were reviewed. The patient's tolerance of                            previous anesthesia was also reviewed. The risks                            and benefits of the procedure and the sedation                            options and risks were discussed with the patient.                            All questions were answered, and informed consent                            was obtained. Prior Anticoagulants: The patient has                            taken no previous anticoagulant or antiplatelet                            agents. ASA Grade Assessment: III - A patient with  severe systemic disease. After reviewing the risks                            and benefits, the patient was deemed in                            satisfactory condition to undergo the procedure.                           After obtaining informed consent, the colonoscope                            was passed under direct vision. Throughout the                         procedure, the patient's blood pressure, pulse, and                            oxygen saturations were monitored continuously. The                            PCF-H190DL ([id number] Olympus peds colonoscope was                            introduced through the anus and advanced to the the                            cecum, identified by appendiceal orifice and                            ileocecal valve. The colonoscopy was performed with                            difficulty due to a tortuous colon and fair prep.                            Successful completion of the procedure was aided by                            straightening and shortening the scope to obtain                            bowel loop reduction and lavage. The patient                            tolerated the procedure well. The quality of the                            bowel preparation was fair. The terminal ileum,                            ileocecal valve, appendiceal orifice, and rectum  were photographed. Scope In: 8:34:27 AM Scope Out: 8:55:16 AM Scope Withdrawal Time: 0 hours 17 minutes 39 seconds  Total Procedure Duration: 0 hours 20 minutes 49 seconds  Findings:      The perianal and digital rectal examinations were normal.      A 10 mm polyp was found in the ascending colon. The polyp was       semi-pedunculated. The polyp was removed with a hot snare. Resection and       retrieval were complete. Estimated blood loss: none.      A 5 mm polyp was found in the ascending colon. The polyp was       semi-sessile. The polyp was removed with a hot snare. Resection and       retrieval were complete. Estimated blood loss: none.      Three flat and semi-sessile polyps were found in the transverse colon       and ascending colon. The polyps were 1 to 3 mm in size. These polyps       were removed with a cold biopsy forceps. Resection and retrieval were       complete. Estimated  blood loss was minimal.      There was a medium-sized lipoma, in the ascending colon.      Internal hemorrhoids were found during retroflexion. The hemorrhoids       were medium-sized and Grade I (internal hemorrhoids that do not       prolapse).      The terminal ileum appeared normal. Impression:               - Preparation of the colon was fair.                           - One 10 mm polyp in the ascending colon, removed                            with a hot snare. Resected and retrieved.                           - One 5 mm polyp in the ascending colon, removed                            with a hot snare. Resected and retrieved.                           - Three 1 to 3 mm polyps in the transverse colon                            and in the ascending colon, removed with a cold                            biopsy forceps. Resected and retrieved.                           - Medium-sized lipoma in the ascending colon.                           - Internal hemorrhoids.                           -  The examined portion of the ileum was normal. Moderate Sedation:      N/A- Per Anesthesia Care Recommendation:           - Patient has a contact number available for                            emergencies. The signs and symptoms of potential                            delayed complications were discussed with the                            patient. Return to normal activities tomorrow.                            Written discharge instructions were provided to the                            patient.                           - High fiber diet.                           - Await pathology results.                           - Repeat colonoscopy for surveillance based on                            pathology results.                           - No aspirin, ibuprofen, naproxen, or other                            non-steroidal anti-inflammatory drugs for 2 weeks. Procedure Code(s):        --- Professional  ---                           [phone], Colonoscopy, flexible; with removal of                            tumor(s), polyp(s), or other lesion(s) by snare                            technique                           45380, 59, Colonoscopy, flexible; with biopsy,                            single or multiple Diagnosis Code(s):        --- Professional ---                           R19.4, Change in bowel habit  K59.00, Constipation, unspecified                           D12.3, Benign neoplasm of transverse colon (hepatic                            flexure or splenic flexure)                           D12.2, Benign neoplasm of ascending colon                           K64.0, First degree hemorrhoids                           D17.5, Benign lipomatous neoplasm of                            intra-abdominal organs CPT copyright 2017 American [employer] Association. All rights reserved. The codes documented in this report are preliminary and upon coder review may  be revised to meet current compliance requirements. Lear Ng, MD 03/14/2018 9:09:18 AM This report has been signed electronically. Number of Addenda: 0

## 2018-03-14 NOTE — Anesthesia Postprocedure Evaluation (Signed)
Anesthesia Post Note  Patient: Caroline Williams  Procedure(s) Performed: COLONOSCOPY WITH PROPOFOL (N/A ) BIOPSY POLYPECTOMY     Patient location during evaluation: Endoscopy Anesthesia Type: MAC Level of consciousness: awake and alert, oriented and patient cooperative Pain management: pain level controlled Vital Signs Assessment: post-procedure vital signs reviewed and stable Respiratory status: spontaneous breathing, nonlabored ventilation and respiratory function stable Cardiovascular status: blood pressure returned to baseline and stable Postop Assessment: no apparent nausea or vomiting Anesthetic complications: no    Last Vitals:  Vitals:   03/14/18 0901 03/14/18 0910  BP: (!) 145/85 (!) 164/89  Pulse: 62 64  Resp: 15 11  Temp: 36.4 C   SpO2: 98% 98%    Last Pain:  Vitals:   03/14/18 0910  TempSrc:   PainSc: 0-No pain                 Alicja Everitt,E. Mateo Overbeck

## 2018-03-14 NOTE — H&P (Signed)
Date of Initial H&P: 03/07/18  History reviewed, patient examined, no change in status, stable for surgery.

## 2018-03-16 ENCOUNTER — Encounter (HOSPITAL_COMMUNITY): Payer: Self-pay | Admitting: Gastroenterology

## 2018-03-16 IMAGING — MG 2D DIGITAL DIAGNOSTIC BILATERAL MAMMOGRAM WITH CAD AND ADJUNCT T
8 of 15 series · 8 of 35 positions shown · non-contrast
Comparison: Previous exam(s).

CLINICAL DATA: 55-year-old female complaining of a painful
superficial right breast mass.

EXAM:
2D DIGITAL DIAGNOSTIC BILATERAL MAMMOGRAM WITH CAD AND ADJUNCT TOMO
ULTRASOUND RIGHT BREAST

[L MLO]
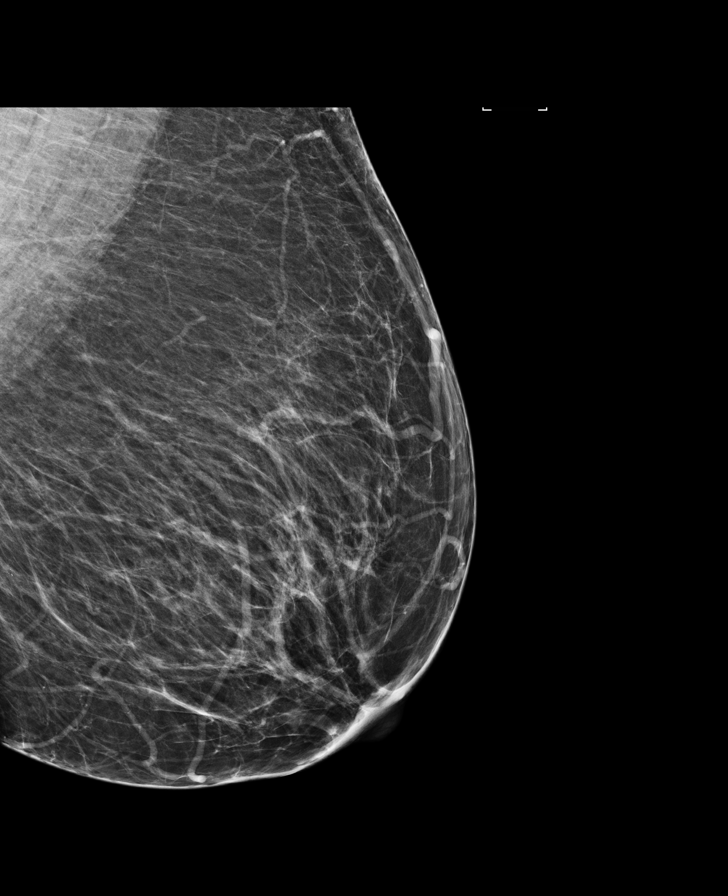

[L MLO synth-2D]
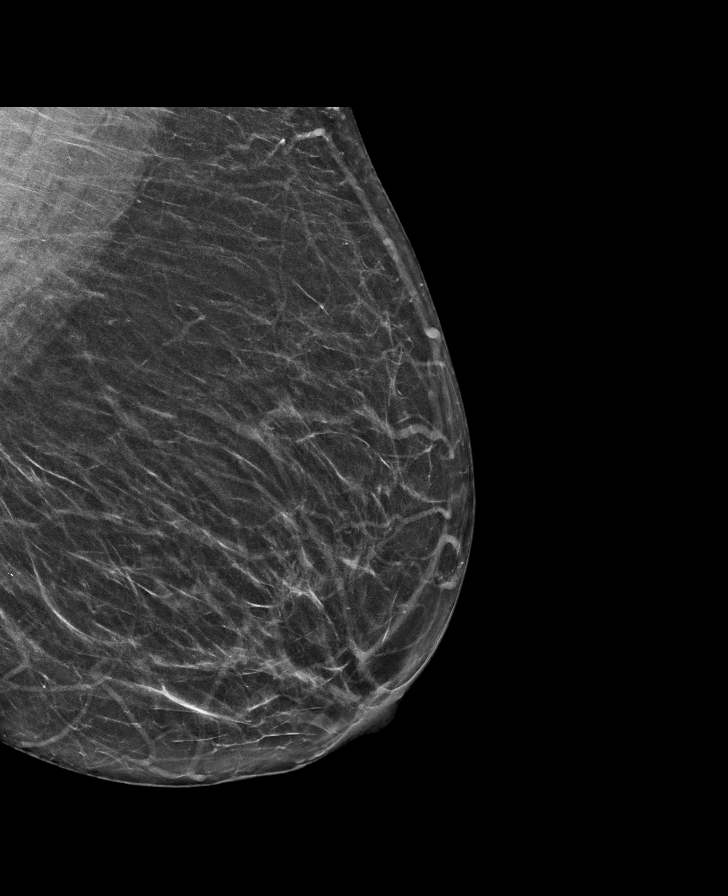

[L CC synth-2D]
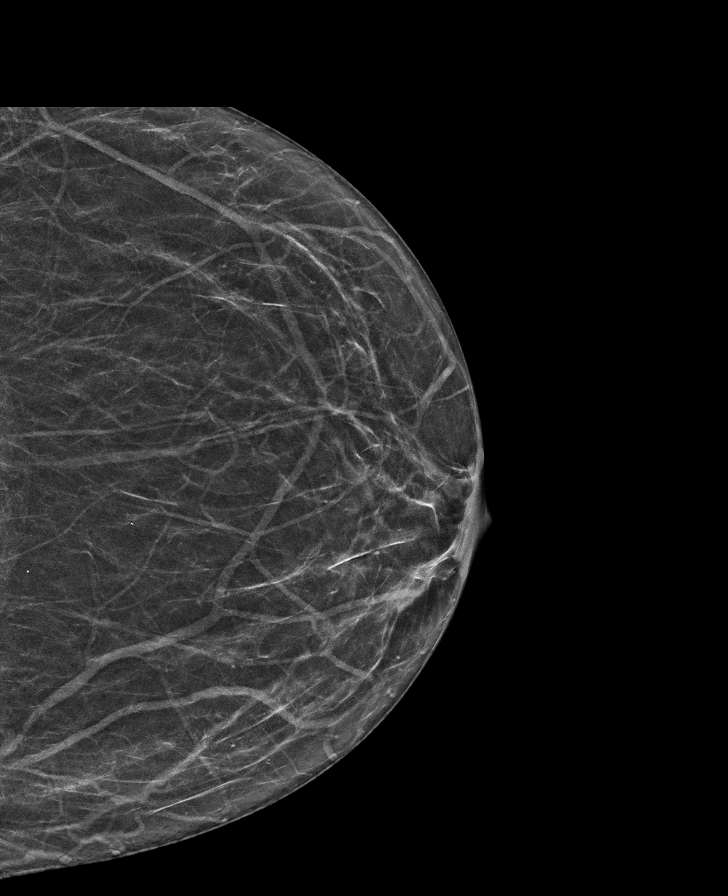

[R MLO]
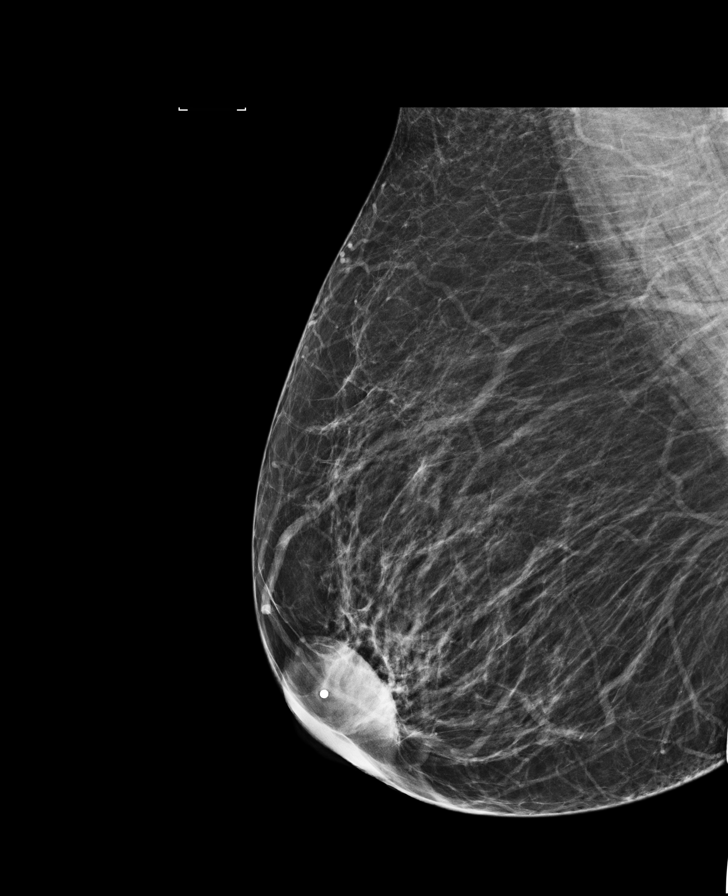

[R CC synth-2D]
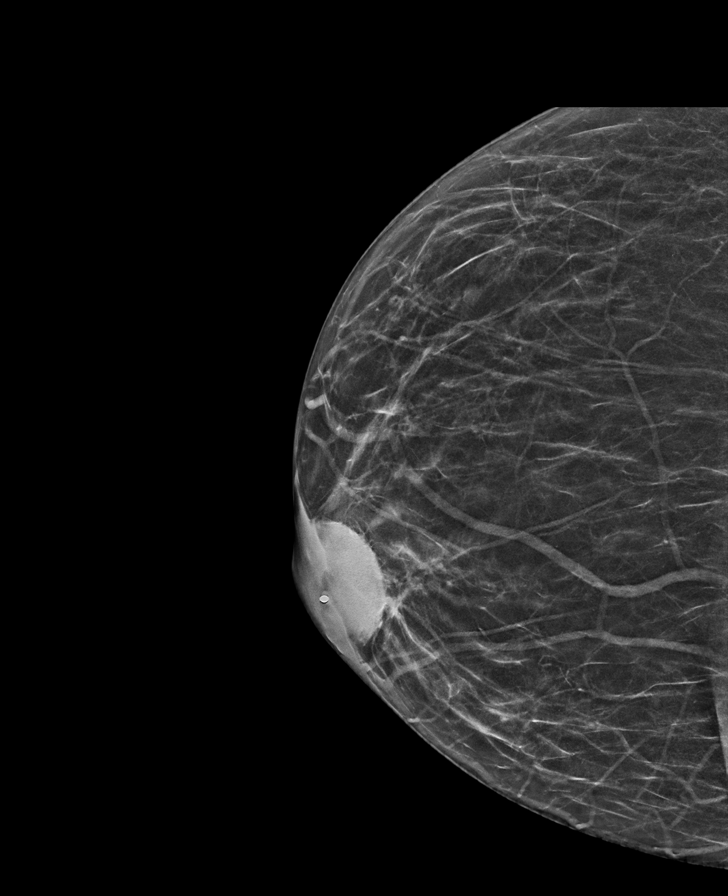

[R TAN]
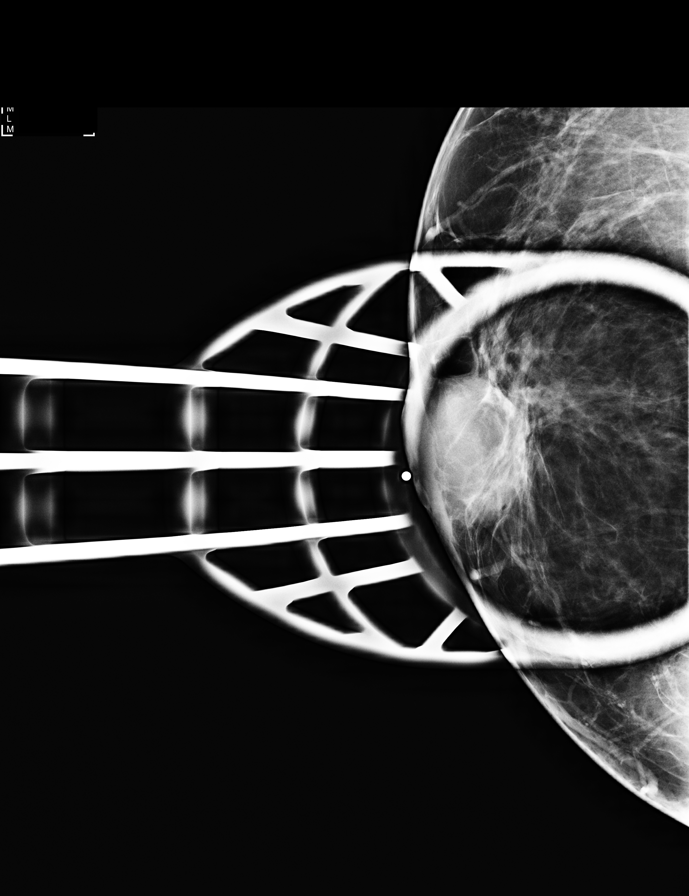

[R CC]
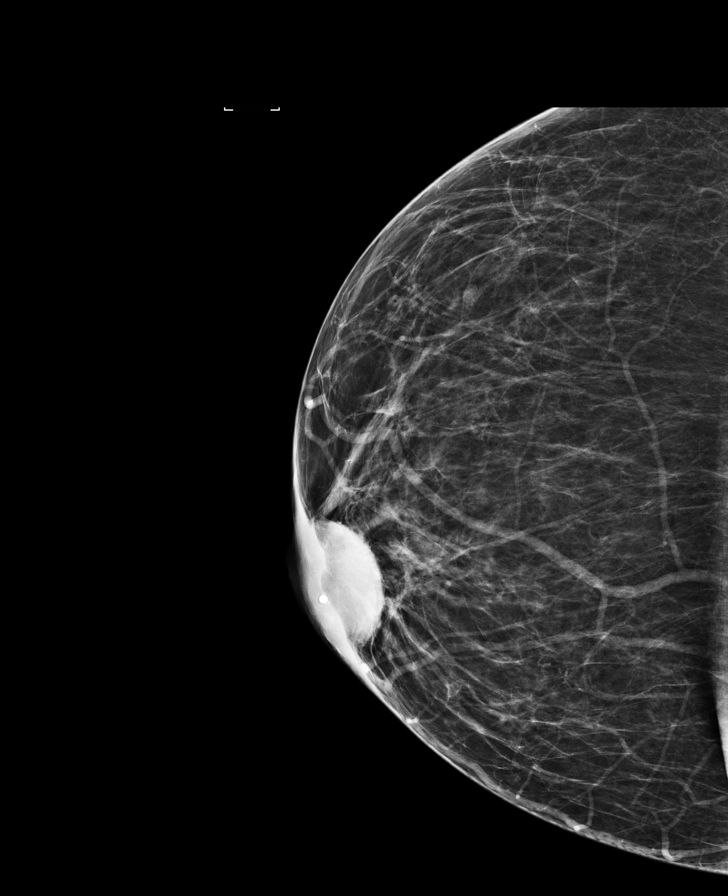

[L CC]
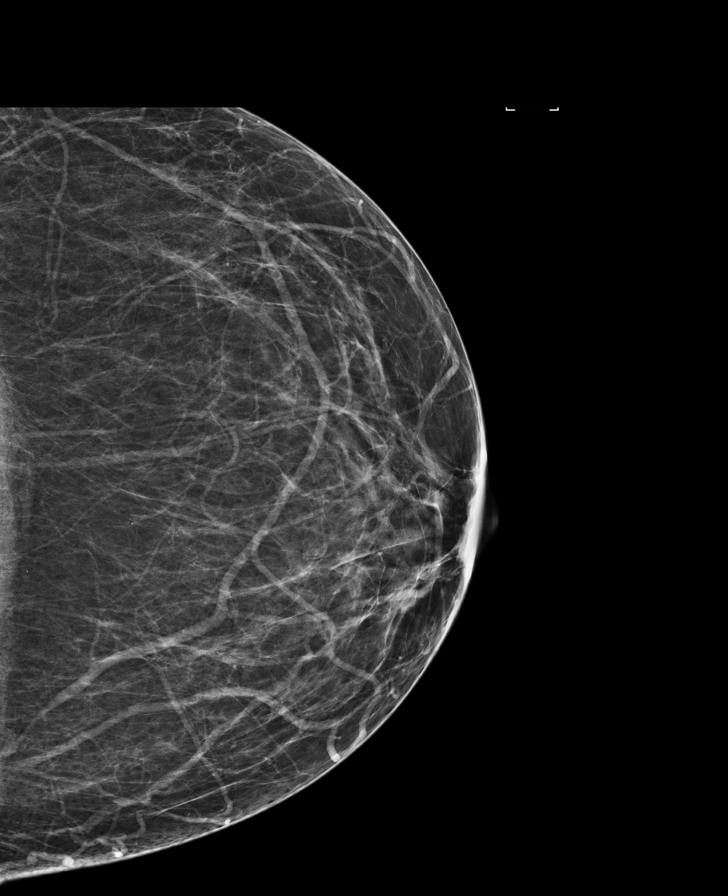

[8 of 35 positions shown; findings below may reference images not displayed]

ACR Breast Density Category b: There are scattered areas of
fibroglandular density.
FINDINGS: There is an ovoid 4.2 cm mass in the subareolar region of the right
breast associated with skin thickening. There are no malignant type
microcalcifications. No suspicious mass or malignant type
microcalcifications identified in the left breast.

Mammographic images were processed with CAD.

On physical exam, there is a superficial erythematous mass in the 12
o'clock subareolar region of the right breast.

Targeted ultrasound is performed, showing a superficial complex
fluid collection in the right breast at 12 o'clock 1 cm from the
nipple measuring 3.9 x 1.8 x 3.6 cm. There is overlying skin
thickening. Sonographic evaluation the right axilla did not show any
enlarged adenopathy.
IMPRESSION: Right breast abscess.

RECOMMENDATION:
Ultrasound-guided aspiration of the right breast abscess is
recommended. The aspiration will be performed and dictated
separately. The patient will be treated with doxycycline 100 mg
b.i.d. for 10 days. She will return for clinical and sonographic
follow-up in 3 days. Dr. Zeeman Johan was
called and made aware of the aspiration and antibiotic therapy.

I have discussed the findings and recommendations with the patient.
Results were also provided in writing at the conclusion of the
visit. If applicable, a reminder letter will be sent to the patient
regarding the next appointment.

BI-RADS CATEGORY  3: Probably benign.

## 2018-04-05 ENCOUNTER — Ambulatory Visit: Payer: Medicare HMO | Admitting: Skilled Nursing Facility1

## 2018-05-17 ENCOUNTER — Encounter (HOSPITAL_COMMUNITY): Payer: Self-pay

## 2018-05-17 ENCOUNTER — Ambulatory Visit (HOSPITAL_COMMUNITY)
Admission: EM | Admit: 2018-05-17 | Discharge: 2018-05-17 | Disposition: A | Discharge status: Home / Self Care | Payer: Medicare HMO | Attending: Family Medicine | Admitting: Family Medicine

## 2018-05-17 DIAGNOSIS — G8929 Other chronic pain: Secondary | ICD-10-CM | POA: Diagnosis not present

## 2018-05-17 DIAGNOSIS — M25561 Pain in right knee: Secondary | ICD-10-CM

## 2018-05-17 MED ORDER — CYCLOBENZAPRINE HCL 10 MG PO TABS: 10.0000 mg | ORAL_TABLET | Freq: Every day | ORAL | 0 refills | Status: DC

## 2018-05-17 MED ORDER — KETOROLAC TROMETHAMINE 60 MG/2ML IM SOLN
60.0000 mg | Freq: Once | INTRAMUSCULAR | Status: AC
  Administered 2018-05-17: 60 mg via INTRAMUSCULAR

## 2018-05-17 MED ORDER — PREDNISONE 50 MG PO TABS: 50.0000 mg | ORAL_TABLET | Freq: Every day | ORAL | 0 refills | Status: DC

## 2018-05-17 MED ORDER — KETOROLAC TROMETHAMINE 60 MG/2ML IM SOLN
INTRAMUSCULAR | Status: AC
  Filled 2018-05-17: qty 2

## 2018-05-17 NOTE — ED Provider Notes (Signed)
[facility]    CSN: [id number] Arrival date & time: 05/17/18  1452     History   Chief Complaint No chief complaint on file.   HPI Caroline Williams is a 57 y.o. female.   This is an established Caroline Williams urgent care patient who comes in today complaining about right knee.  She has been seen in the past for chronic right knee pain.  Patient had a total knee done by Dr. Mardelle Matte in 2010 and then a revision that was done 2014 Dr. Sharol Given.  She is currently under the care of Dr. Alvan Dame.  She has been in physical therapy and is on chronic narcotic pain medicine.  There is some consideration for doing a total knee on the other side, but patient can hardly walk and requires a cane because of the right knee pain.  The pain is located just below the joint line on the medial side.  It has been acting up now for about a week.     Past Medical History:  Diagnosis Date  . Anxiety   . Arthritis   . Asthma   . Bipolar 1 disorder ([place])   . Breast discharge   . Breast lump   . Breast pain   . Chronic pain    on MS Contin and oxycodone  . Depression   . Diabetes mellitus    diet and exercise controlled  . Escherichia coli (E. coli) infection   . Fever   . GERD (gastroesophageal reflux disease)   . History of chest pain   . History of kidney stones   . History of knee replacement, total   . Hypertension   . Hypothyroidism   . N&V (nausea and vomiting)   . Peripheral neuropathy    Right foot no sensation  . Pneumonia    2015,2014  . Sleep apnea    does not use CPAP  . Thyroid disease   . Wears glasses     Patient Active Problem List   Diagnosis Date Noted  . Change in bowel habits 03/14/2018  . Chest pain 01/26/2016  . Sepsis due to Escherichia coli ([place])   . E coli bacteremia   . E. coli UTI   . Hallucination, drug-induced ([place]) 03/12/2015  . Leukocytosis 03/12/2015  . Hypokalemia 03/12/2015  . Hypothyroidism 03/12/2015  . UTI (urinary tract infection) 03/12/2015    . Sepsis due to Gram negative bacteria ([place])   . Gram-negative bacteremia 03/11/2015  . Lap gastric bypass June 2016 11/25/2014  . Diabetes mellitus with neuropathy ([place]) 06/20/2013  . Chronic pain of right knee 06/20/2013  . Morbid obesity ([place]) 07/05/2012    Past Surgical History:  Procedure Laterality Date  . ABDOMINAL HYSTERECTOMY     partial  . APPENDECTOMY    . BIOPSY  03/14/2018   Procedure: BIOPSY;  Surgeon: Wilford Corner, MD;  Location: [facility];  Service: Endoscopy;;  . BREAST DUCTAL SYSTEM EXCISION Right 08/31/2017   Procedure: RIGHT BREAST CENTRAL DUCT EXCISION;  Surgeon: Erroll Luna, MD;  Location: [facility];  Service: General;  Laterality: Right;  . BREAST EXCISIONAL BIOPSY Right   . BREAST LUMPECTOMY     right  . CARDIAC CATHETERIZATION     no significant CAD, nl LV function by 11/08/06 cath  . CESAREAN SECTION     x4  . COLONOSCOPY WITH PROPOFOL N/A 03/14/2018   Procedure: COLONOSCOPY WITH PROPOFOL;  Surgeon: Wilford Corner, MD;  Location: [facility];  Service: Endoscopy;  Laterality: N/A;  . GASTRIC ROUX-EN-Y N/A 11/25/2014   Procedure: LAPAROSCOPIC ROUX-EN-Y GASTRIC BYPASS WITH UPPER ENDOSCOPY;  Surgeon: Johnathan Hausen, MD;  Location: [facility];  Service: General;  Laterality: N/A;  . HERNIA REPAIR    . JOINT REPLACEMENT    . KNEE SURGERY     right  . MOUTH SURGERY    . [place]  . POLYPECTOMY  03/14/2018   Procedure: POLYPECTOMY;  Surgeon: Wilford Corner, MD;  Location: [facility];  Service: Endoscopy;;  . TOTAL KNEE REVISION  06/21/2012   Procedure: TOTAL KNEE REVISION;  Surgeon: Newt Minion, MD;  Location: [facility];  Service: Orthopedics;  Laterality: Right;  Revision Right Total Knee Arthroplasty    OB History   None      Home Medications    Prior to Admission medications   Medication Sig Start Date End Date Taking? Authorizing Provider  albuterol (PROVENTIL HFA;VENTOLIN HFA) 108 (90 BASE)  MCG/ACT inhaler Inhale 2 puffs into the lungs every 6 (six) hours as needed for wheezing or shortness of breath. Shortness of breath    [provider]  ALPRAZolam (XANAX) 1 MG tablet Take 1 mg by mouth 4 (four) times daily.  07/02/14   [provider]  amLODipine (NORVASC) 5 MG tablet Take 5 mg by mouth daily. 06/15/16   [provider]  amphetamine-dextroamphetamine (ADDERALL XR) 30 MG 24 hr capsule Take 30 mg by mouth daily.  03/10/18   [provider]  amphetamine-dextroamphetamine (ADDERALL) 20 MG tablet Take 20 mg by mouth every evening.  10/13/17   [provider]  buPROPion (WELLBUTRIN XL) 300 MG 24 hr tablet Take 300 mg by mouth daily.    [provider]  Cyanocobalamin (VITAMIN B 12 PO) Take 1 tablet by mouth daily.     [provider]  cyclobenzaprine (FLEXERIL) 10 MG tablet Take 1 tablet (10 mg total) by mouth at bedtime. 05/17/18   Robyn Haber, MD  doxepin (SINEQUAN) 75 MG capsule Take 75 mg by mouth at bedtime. 03/02/18   [provider]  EPINEPHrine 0.3 mg/0.3 mL IJ SOAJ injection Inject 0.3 mg as directed as needed (severe allergic reaction).  01/11/14   [provider]  escitalopram (LEXAPRO) 20 MG tablet Take 20 mg by mouth daily.    [provider]  LATUDA 120 MG TABS Take 120 mg by mouth at bedtime.  03/04/18   [provider]  levothyroxine (SYNTHROID, LEVOTHROID) 112 MCG tablet Take 112 mcg by mouth daily. 08/09/16   [provider]  linaclotide (LINZESS) 290 MCG CAPS capsule Take 290 mcg by mouth at bedtime.    [provider]  LYRICA 75 MG capsule Take 75 mg by mouth 2 (two) times daily.  08/18/17   [provider]  morphine (MS CONTIN) 15 MG 12 hr tablet Take 1 tablet (15 mg total) by mouth every 12 (twelve) hours. 11/14/14   Bayard Hugger, NP  Multiple Vitamin (MULTIVITAMIN) tablet Take 1 tablet by mouth every morning.     [provider]    nitroGLYCERIN (NITROSTAT) 0.4 MG SL tablet DISSOLVE 1 TABLET UNDER THE TONGUE EVERY 5 MINUTES AS NEEDED Patient taking differently: Place 0.4 mg under the tongue every 5 (five) minutes as needed for chest pain.  01/27/16   Belva Crome, MD  olmesartan-hydrochlorothiazide (BENICAR HCT) 20-12.5 MG tablet Take 1 tablet by mouth daily.     [provider]  omeprazole (PRILOSEC) 40 MG capsule Take 40  mg by mouth daily.  08/14/17   [provider]  Oxycodone HCl 10 MG TABS Take 1 tablet (10 mg total) by mouth every 6 (six) hours as needed (for pain). 11/14/14   Bayard Hugger, NP  predniSONE (DELTASONE) 50 MG tablet Take 1 tablet (50 mg total) by mouth daily with breakfast. 05/17/18   Robyn Haber, MD  VOLTAREN 1 % GEL Apply 4 g topically 4 (four) times daily. 12/25/17   Tereasa Coop, PA-C  LISINOPRIL PO Take by mouth daily.    09/06/11  [provider]    Family History Family History  Problem Relation Age of Onset  . Cancer Father        colon  . Stroke Father   . Heart disease Father   . Diabetes Father   . Hypertension Father   . Depression Sister   . Hypertension Mother   . Breast cancer Neg Hx     Social History Social History   Tobacco Use  . Smoking status: Current Every Day Smoker    Packs/day: 0.50    Years: 43.00    Pack years: 21.50    Types: Cigarettes  . Smokeless tobacco: Never Used  Substance Use Topics  . Alcohol use: Yes    Comment: social  . Drug use: No     Allergies   Aspirin; Sulfa antibiotics; and Other   Review of Systems Review of Systems   Physical Exam Triage Vital Signs ED Triage Vitals  Enc Vitals Group     BP 05/17/18 1531 (!) 149/79     Pulse Rate 05/17/18 1531 73     Resp 05/17/18 1531 18     Temp 05/17/18 1531 97.8 F (36.6 C)     Temp Source 05/17/18 1531 Oral     SpO2 05/17/18 1531 99 %     Weight --      Height --      Head Circumference --      Peak Flow --      Pain Score 05/17/18 1532 7      Pain Loc --      Pain Edu? --      Excl. in [place]? --    No data found.  Updated Vital Signs BP (!) 149/79 (BP Location: Right Arm)   Pulse 73   Temp 97.8 F (36.6 C) (Oral)   Resp 18   SpO2 99%    Physical Exam  Constitutional: She is oriented to person, place, and time. She appears well-developed and well-nourished.  Morbidly obese  HENT:  Right Ear: External ear normal.  Left Ear: External ear normal.  Mouth/Throat: Oropharynx is clear and moist.  Eyes: Conjunctivae are normal.  Neck: Normal range of motion. Neck supple.  Pulmonary/Chest: Effort normal.  Musculoskeletal:  Tender medial aspect of right knee with mild swelling just below the joint line.  There is a well-healed surgical scar down the middle of the knee  Neurological: She is alert and oriented to person, place, and time.  Skin: Skin is warm and dry. No erythema.  Psychiatric: She has a normal mood and affect.  Nursing note and vitals reviewed.    UC Treatments / Results  Labs (all labs ordered are listed, but only abnormal results are displayed) Labs Reviewed - No data to display  EKG None  Radiology No results found.  Procedures Procedures (including critical care time)  Medications Ordered in UC Medications  ketorolac (TORADOL) injection 60 mg (has no administration  in time range)    Initial Impression / Assessment and Plan / UC Course  I have reviewed the triage vital signs and the nursing notes.  Pertinent labs & imaging results that were available during my care of the patient were reviewed by me and considered in my medical decision making (see chart for details).     Final Clinical Impressions(s) / UC Diagnoses   Final diagnoses:  Chronic pain of right knee     Discharge Instructions     Follow-up with Dr. Alvan Dame.    ED Prescriptions    Medication Sig Dispense Auth. Provider   cyclobenzaprine (FLEXERIL) 10 MG tablet Take 1 tablet (10 mg total) by mouth at bedtime. 20  tablet Robyn Haber, MD   predniSONE (DELTASONE) 50 MG tablet Take 1 tablet (50 mg total) by mouth daily with breakfast. 7 tablet Robyn Haber, MD     Controlled Substance Prescriptions [place] Controlled Substance Registry consulted? Not Applicable   Robyn Haber, MD 05/17/18 [phone]

## 2018-05-17 NOTE — Discharge Instructions (Signed)
Follow-up with Dr. Alvan Dame.

## 2018-06-29 ENCOUNTER — Ambulatory Visit (INDEPENDENT_AMBULATORY_CARE_PROVIDER_SITE_OTHER)
Admission: EM | Admit: 2018-06-29 | Discharge: 2018-06-29 | Disposition: A | Discharge status: Nursing Home (Includes ICF) | Payer: Medicare HMO | Source: Home / Self Care | Attending: Emergency Medicine | Admitting: Emergency Medicine

## 2018-06-29 ENCOUNTER — Encounter (HOSPITAL_COMMUNITY): Payer: Self-pay | Admitting: Emergency Medicine

## 2018-06-29 ENCOUNTER — Emergency Department (HOSPITAL_COMMUNITY)
Admission: EM | Admit: 2018-06-29 | Discharge: 2018-06-30 | Disposition: A | Discharge status: Home / Self Care | Payer: Medicare HMO | Attending: Emergency Medicine | Admitting: Emergency Medicine

## 2018-06-29 ENCOUNTER — Emergency Department (HOSPITAL_COMMUNITY): Payer: Medicare HMO

## 2018-06-29 DIAGNOSIS — R197 Diarrhea, unspecified: Secondary | ICD-10-CM

## 2018-06-29 DIAGNOSIS — R1011 Right upper quadrant pain: Secondary | ICD-10-CM | POA: Insufficient documentation

## 2018-06-29 DIAGNOSIS — F17210 Nicotine dependence, cigarettes, uncomplicated: Secondary | ICD-10-CM | POA: Diagnosis not present

## 2018-06-29 DIAGNOSIS — E039 Hypothyroidism, unspecified: Secondary | ICD-10-CM | POA: Diagnosis not present

## 2018-06-29 DIAGNOSIS — I10 Essential (primary) hypertension: Secondary | ICD-10-CM | POA: Diagnosis not present

## 2018-06-29 DIAGNOSIS — E119 Type 2 diabetes mellitus without complications: Secondary | ICD-10-CM | POA: Diagnosis not present

## 2018-06-29 DIAGNOSIS — R1084 Generalized abdominal pain: Secondary | ICD-10-CM

## 2018-06-29 DIAGNOSIS — R1110 Vomiting, unspecified: Secondary | ICD-10-CM | POA: Diagnosis present

## 2018-06-29 DIAGNOSIS — R112 Nausea with vomiting, unspecified: Secondary | ICD-10-CM

## 2018-06-29 LAB — COMPREHENSIVE METABOLIC PANEL
ALT: 22 U/L (ref 0–44)
AST: 21 U/L (ref 15–41)
Albumin: 4.3 g/dL (ref 3.5–5.0)
Alkaline Phosphatase: 88 U/L (ref 38–126)
Anion gap: 10 (ref 5–15)
BUN: 5 mg/dL — ABNORMAL LOW (ref 6–20)
CO2: 25 mmol/L (ref 22–32)
Calcium: 9.5 mg/dL (ref 8.9–10.3)
Chloride: 106 mmol/L (ref 98–111)
Creatinine, Ser: 1.11 mg/dL — ABNORMAL HIGH (ref 0.44–1.00)
GFR calc Af Amer: >60 mL/min (ref >60)
GFR calc non Af Amer: 55 mL/min — ABNORMAL LOW (ref >60)
Glucose, Bld: 99 mg/dL (ref 70–99)
Potassium: 3.4 mmol/L — ABNORMAL LOW (ref 3.5–5.1)
Sodium: 141 mmol/L (ref 135–145)
Total Bilirubin: 0.6 mg/dL (ref 0.3–1.2)
Total Protein: 7.4 g/dL (ref 6.5–8.1)

## 2018-06-29 LAB — CBC
HCT: 48.1 % — ABNORMAL HIGH (ref 36.0–46.0)
Hemoglobin: 15.2 g/dL — ABNORMAL HIGH (ref 12.0–15.0)
MCH: 26.8 pg (ref 26.0–34.0)
MCHC: 31.6 g/dL (ref 30.0–36.0)
MCV: 84.7 fL (ref 80.0–100.0)
Platelets: 277 10*3/uL (ref 150–400)
RBC: 5.68 MIL/uL — ABNORMAL HIGH (ref 3.87–5.11)
RDW: 16.3 % — ABNORMAL HIGH (ref 11.5–15.5)
WBC: 5 10*3/uL (ref 4.0–10.5)
nRBC: 0 % (ref 0.0–0.2)

## 2018-06-29 LAB — POCT I-STAT, CHEM 8
BUN: 5 mg/dL — ABNORMAL LOW (ref 6–20)
Calcium, Ion: 1.15 mmol/L (ref 1.15–1.40)
Chloride: 104 mmol/L (ref 98–111)
Creatinine, Ser: 0.9 mg/dL (ref 0.44–1.00)
Glucose, Bld: 105 mg/dL — ABNORMAL HIGH (ref 70–99)
HCT: 49 % — ABNORMAL HIGH (ref 36.0–46.0)
Hemoglobin: 16.7 g/dL — ABNORMAL HIGH (ref 12.0–15.0)
Potassium: 3.2 mmol/L — ABNORMAL LOW (ref 3.5–5.1)
Sodium: 140 mmol/L (ref 135–145)
TCO2: 26 mmol/L (ref 22–32)

## 2018-06-29 LAB — LIPASE, BLOOD: Lipase: 27 U/L (ref 11–51)

## 2018-06-29 LAB — I-STAT BETA HCG BLOOD, ED (MC, WL, AP ONLY): I-stat hCG, quantitative: <5 m[IU]/mL (ref <5)

## 2018-06-29 LAB — TROPONIN I: Troponin I: <0.03 ng/mL (ref <0.03)

## 2018-06-29 MED ORDER — ONDANSETRON 4 MG PO TBDP
8.0000 mg | ORAL_TABLET | Freq: Once | ORAL | Status: AC
  Administered 2018-06-29: 8 mg via ORAL

## 2018-06-29 MED ORDER — ONDANSETRON HCL 4 MG PO TABS
4.0000 mg | ORAL_TABLET | Freq: Once | ORAL | Status: AC
  Administered 2018-06-29: 4 mg via ORAL
  Filled 2018-06-29: qty 1

## 2018-06-29 MED ORDER — SODIUM CHLORIDE 0.9% FLUSH
3.0000 mL | Freq: Once | INTRAVENOUS | Status: AC
  Administered 2018-06-30: 3 mL via INTRAVENOUS

## 2018-06-29 MED ORDER — ONDANSETRON 4 MG PO TBDP
ORAL_TABLET | ORAL | Status: AC
  Filled 2018-06-29: qty 2

## 2018-06-29 NOTE — ED Provider Notes (Signed)
HPI  SUBJECTIVE:  Caroline Williams is a 58 y.o. female who presents with acute onset of multiple episodes of nonbilious, nonbloody vomiting, "countless" episodes of watery, nonbloody diarrhea starting at 0200 last night.  States it woke her up from sleep.  She reports upper/periumbilical pain described as stabbing, constant.  Is nonmigratory, nonradiating.  She states that she is unable to tolerate any p.o. whatsoever.   She tried small sips of water and taking her medications but she states that she vomited this up.  She states that her last urine output was at 0300 last night. She reports lightheadedness dizziness with standing or sitting up, anorexia.  She also reports right-sided back pain.  No chest pain, shortness of breath, abdominal distention.  No raw or undercooked foods, questionable leftovers, contacts with similar symptoms.  Car ride over here was not painful.  She tried Tylenol at 830 this morning, but states that she vomited up.  No alleviating factors.  Symptoms are worse with trying to eat or drink.  She has a past medical history of diabetes, E. coli UTI, sepsis.  States that this feels similar to the time that she was septic.  Also hypothyroidism, hypertension, hypocalcemia, nephrolithiasis.  She is status post gastric bypass, 4 C-sections, hysterectomy.  PNT:[email or web address], Eulas Post, MD   Past Medical History:  Diagnosis Date  . Anxiety   . Arthritis   . Asthma   . Bipolar 1 disorder ([place])   . Breast discharge   . Breast lump   . Breast pain   . Chronic pain    on MS Contin and oxycodone  . Depression   . Diabetes mellitus    diet and exercise controlled  . Escherichia coli (E. coli) infection   . Fever   . GERD (gastroesophageal reflux disease)   . History of chest pain   . History of kidney stones   . History of knee replacement, total   . Hypertension   . Hypothyroidism   . N&V (nausea and vomiting)   . Peripheral neuropathy    Right foot no sensation  . Pneumonia     2015,2014  . Sleep apnea    does not use CPAP  . Thyroid disease   . Wears glasses     Past Surgical History:  Procedure Laterality Date  . ABDOMINAL HYSTERECTOMY     partial  . APPENDECTOMY    . BIOPSY  03/14/2018   Procedure: BIOPSY;  Surgeon: Wilford Corner, MD;  [facility];  Service: Endoscopy;;  . BREAST DUCTAL SYSTEM EXCISION Right 08/31/2017   Procedure: RIGHT BREAST CENTRAL DUCT EXCISION;  Surgeon: Erroll Luna, MD;  Location: [place];  Service: General;  Laterality: Right;  . BREAST EXCISIONAL BIOPSY Right   . BREAST LUMPECTOMY     right  . CARDIAC CATHETERIZATION     no significant CAD, nl LV function by 11/08/06 cath  . CESAREAN SECTION     x4  . COLONOSCOPY WITH PROPOFOL N/A 03/14/2018   Procedure: COLONOSCOPY WITH PROPOFOL;  Surgeon: Wilford Corner, MD;  [facility];  Service: Endoscopy;  Laterality: N/A;  . GASTRIC ROUX-EN-Y N/A 11/25/2014   Procedure: LAPAROSCOPIC ROUX-EN-Y GASTRIC BYPASS WITH UPPER ENDOSCOPY;  Surgeon: Johnathan Hausen, MD;  Location: [facility];  Service: General;  Laterality: N/A;  . HERNIA REPAIR    . JOINT REPLACEMENT    . KNEE SURGERY     right  . MOUTH SURGERY    . [place]  .  POLYPECTOMY  03/14/2018   Procedure: POLYPECTOMY;  Surgeon: Wilford Corner, MD;  [facility];  Service: Endoscopy;;  . TOTAL KNEE REVISION  06/21/2012   Procedure: TOTAL KNEE REVISION;  Surgeon: Newt Minion, MD;  Location: [place];  Service: Orthopedics;  Laterality: Right;  Revision Right Total Knee Arthroplasty    Family History  Problem Relation Age of Onset  . Cancer Father        colon  . Stroke Father   . Heart disease Father   . Diabetes Father   . Hypertension Father   . Depression Sister   . Hypertension Mother   . Breast cancer Neg Hx     Social History   Tobacco Use  . Smoking status: Current Every Day Smoker    Packs/day: 0.50    Years: 43.00    Pack years:  21.50    Types: Cigarettes  . Smokeless tobacco: Never Used  Substance Use Topics  . Alcohol use: Yes    Comment: social  . Drug use: No    No current facility-administered medications for this encounter.   Current Outpatient Medications:  .  albuterol (PROVENTIL HFA;VENTOLIN HFA) 108 (90 BASE) MCG/ACT inhaler, Inhale 2 puffs into the lungs every 6 (six) hours as needed for wheezing or shortness of breath. Shortness of breath, Disp: , Rfl:  .  ALPRAZolam (XANAX) 1 MG tablet, Take 1 mg by mouth 4 (four) times daily. , Disp: , Rfl: 0 .  amLODipine (NORVASC) 5 MG tablet, Take 5 mg by mouth daily., Disp: , Rfl: 3 .  amphetamine-dextroamphetamine (ADDERALL XR) 30 MG 24 hr capsule, Take 30 mg by mouth daily. , Disp: , Rfl:  .  amphetamine-dextroamphetamine (ADDERALL) 20 MG tablet, Take 20 mg by mouth every evening. , Disp: , Rfl: 0 .  buPROPion (WELLBUTRIN XL) 300 MG 24 hr tablet, Take 300 mg by mouth daily., Disp: , Rfl:  .  Cyanocobalamin (VITAMIN B 12 PO), Take 1 tablet by mouth daily. , Disp: , Rfl:  .  cyclobenzaprine (FLEXERIL) 10 MG tablet, Take 1 tablet (10 mg total) by mouth at bedtime., Disp: 20 tablet, Rfl: 0 .  doxepin (SINEQUAN) 75 MG capsule, Take 75 mg by mouth at bedtime., Disp: , Rfl: 0 .  EPINEPHrine 0.3 mg/0.3 mL IJ SOAJ injection, Inject 0.3 mg as directed as needed (severe allergic reaction). , Disp: , Rfl:  .  escitalopram (LEXAPRO) 20 MG tablet, Take 20 mg by mouth daily., Disp: , Rfl:  .  LATUDA 120 MG TABS, Take 120 mg by mouth at bedtime. , Disp: , Rfl: 2 .  levothyroxine (SYNTHROID, LEVOTHROID) 112 MCG tablet, Take 112 mcg by mouth daily., Disp: , Rfl: 2 .  linaclotide (LINZESS) 290 MCG CAPS capsule, Take 290 mcg by mouth at bedtime., Disp: , Rfl:  .  LYRICA 75 MG capsule, Take 75 mg by mouth 2 (two) times daily. , Disp: , Rfl: 5 .  morphine (MS CONTIN) 15 MG 12 hr tablet, Take 1 tablet (15 mg total) by mouth every 12 (twelve) hours., Disp: 60 tablet, Rfl: 0 .   Multiple Vitamin (MULTIVITAMIN) tablet, Take 1 tablet by mouth every morning. , Disp: , Rfl:  .  nitroGLYCERIN (NITROSTAT) 0.4 MG SL tablet, DISSOLVE 1 TABLET UNDER THE TONGUE EVERY 5 MINUTES AS NEEDED (Patient taking differently: Place 0.4 mg under the tongue every 5 (five) minutes as needed for chest pain. ), Disp: 300 tablet, Rfl: 3 .  olmesartan-hydrochlorothiazide (BENICAR HCT) 20-12.5 MG tablet,  Take 1 tablet by mouth daily. , Disp: , Rfl:  .  omeprazole (PRILOSEC) 40 MG capsule, Take 40 mg by mouth daily. , Disp: , Rfl: 5 .  Oxycodone HCl 10 MG TABS, Take 1 tablet (10 mg total) by mouth every 6 (six) hours as needed (for pain)., Disp: 120 tablet, Rfl: 0 .  VOLTAREN 1 % GEL, Apply 4 g topically 4 (four) times daily., Disp: 100 g, Rfl: 11  Allergies  Allergen Reactions  . Aspirin Anaphylaxis and Other (See Comments)    Pt states she has had Toradol several times without any reactions  . Sulfa Antibiotics Anaphylaxis  . Other Other (See Comments)    No blood products.  Patient did  Request that only albumin or albumin-containing products may be administered     ROS  As noted in HPI.   Physical Exam  BP (!) 179/114 (BP Location: Right Arm)   Pulse 80   Temp 98.6 F (37 C) (Oral)   Resp 18   SpO2 96%   Constitutional: Well developed, well nourished.  Appears ill. Eyes:  EOMI, conjunctiva normal bilaterally HENT: Normocephalic, atraumatic,mucus membranes moist Respiratory: Normal inspiratory effort, lungs clear bilaterally. Cardiovascular: Normal rate, regular rhythm, no murmurs, rubs, gallops GI: + laparoscopic surgical scars.  Positive low midline horizontal surgical scar consistent with C-section.  Nondistended.  Positive periumbilical tenderness.  Positive right upper quadrant tenderness with voluntary guarding.  No rebound.  Positive Murphy.  Negative McBurney.  negative tap table test. Back: Bilateral CVAT worse on the right than the left skin: No rash, skin  intact Musculoskeletal: no deformities Neurologic: Alert & oriented x 3, no focal neuro deficits Psychiatric: Speech and behavior appropriate   ED Course   Medications  ondansetron (ZOFRAN-ODT) disintegrating tablet 8 mg (8 mg Oral Given 06/29/18 1756)    Orders Placed This Encounter  Procedures  . I-stat chem 8    Standing Status:   Standing    Number of Occurrences:   1  . I-STAT, chem 8    Standing Status:   Standing    Number of Occurrences:   1  . ED EKG    Standing Status:   Standing    Number of Occurrences:   1    Order Specific Question:   Reason for Exam    Answer:   Weakness    Results for orders placed or performed during the hospital encounter of 06/29/18 (from the past 24 hour(s))  I-STAT, chem 8     Status: Abnormal   Collection Time: 06/29/18  5:07 PM  Result Value Ref Range   Sodium 140 135 - 145 mmol/L   Potassium 3.2 (L) 3.5 - 5.1 mmol/L   Chloride 104 98 - 111 mmol/L   BUN 5 (L) 6 - 20 mg/dL   Creatinine, Ser 0.90 0.44 - 1.00 mg/dL   Glucose, Bld 105 (H) 70 - 99 mg/dL   Calcium, Ion 1.15 1.15 - 1.40 mmol/L   TCO2 26 22 - 32 mmol/L   Hemoglobin 16.7 (H) 12.0 - 15.0 g/dL   HCT 49.0 (H) 36.0 - 46.0 %   No results found.  ED Clinical Impression  Nausea vomiting and diarrhea  Right upper quadrant abdominal pain   ED Assessment/Plan  She is hypokalemic on i-STAT.  She appears to be hemoconcentrated.  Set otherwise unremarkable.  Concern for significant dehydration as she has not urinated in 20 hours.  She was unable to provide a urine specimen here in clinic.  Checking EKG because of the potassium of 3.2.  In the differential is cholecystitis, cholelithiasis, pancreatitis, obstructing nephrolithiasis, obstruction, perforation. Transferring to the ED for comprehensive evaluation.  Giving 8 mg of Zofran prior to discharge.  EKG: Normal sinus rhythm, rate 65.  Left axis deviation.  No hypertrophy.  No ischemic ST T wave changes.  Discussed labs,  medical decision making, rationale for transfer to the emergency department.  Patient agrees with plan.  Feel that she is stable to go by private vehicle.  Meds ordered this encounter  Medications  . ondansetron (ZOFRAN-ODT) disintegrating tablet 8 mg    *This clinic note was created using [employer]. Therefore, there may be occasional mistakes despite careful proofreading.   ?   Melynda Ripple, MD 06/29/18 [phone]

## 2018-06-29 NOTE — ED Triage Notes (Signed)
Pt presents to [facility] for assessment of nausea, emesis and diarrhea since 2pm this morning.  Patient states she has not urinated in a while.  States she feels weak, keeps eyes closed during triage.

## 2018-06-29 NOTE — ED Triage Notes (Signed)
Pt sent from [facility] for abd pain n/v/d and hypokalemia. Hypertensive and has hx of same.

## 2018-06-29 NOTE — ED Notes (Signed)
Brought patient back for re-evaluation to triage. She is still having nausea, "pressure" in her chest, "throbbing" pain in the right arm and tingling in her fingers (equal grip strength and symmetry), and c/o increasing abdominal pain. She is hypertensive, but says she took her HTN meds but vomited since then. Updated at this time

## 2018-06-29 NOTE — Discharge Instructions (Addendum)
Do not have anything to eat or drink until your ER evaluation is complete.  Let them know if your abdominal pain changes, gets worse or if you start having chest pain.

## 2018-06-30 ENCOUNTER — Emergency Department (HOSPITAL_COMMUNITY): Payer: Medicare HMO

## 2018-06-30 DIAGNOSIS — R1084 Generalized abdominal pain: Secondary | ICD-10-CM | POA: Diagnosis not present

## 2018-06-30 MED ORDER — IOHEXOL 300 MG/ML  SOLN
100.0000 mL | Freq: Once | INTRAMUSCULAR | Status: AC | PRN
  Administered 2018-06-30: 100 mL via INTRAVENOUS

## 2018-06-30 MED ORDER — METOCLOPRAMIDE HCL 10 MG PO TABS: 10.0000 mg | ORAL_TABLET | Freq: Three times a day (TID) | ORAL | 0 refills | Status: DC | PRN

## 2018-06-30 MED ORDER — METOCLOPRAMIDE HCL 5 MG/ML IJ SOLN
10.0000 mg | Freq: Once | INTRAMUSCULAR | Status: AC
  Administered 2018-06-30: 10 mg via INTRAVENOUS
  Filled 2018-06-30: qty 2

## 2018-06-30 MED ORDER — LACTATED RINGERS IV BOLUS
1000.0000 mL | Freq: Once | INTRAVENOUS | Status: AC
  Administered 2018-06-30: 1000 mL via INTRAVENOUS

## 2018-06-30 MED ORDER — FENTANYL CITRATE (PF) 100 MCG/2ML IJ SOLN
50.0000 ug | Freq: Once | INTRAMUSCULAR | Status: AC
  Administered 2018-06-30: 50 ug via INTRAVENOUS
  Filled 2018-06-30: qty 2

## 2018-06-30 NOTE — ED Provider Notes (Signed)
Emergency Department Provider Note   I have reviewed the triage vital signs and the nursing notes.   HISTORY  Chief Complaint Abdominal Pain   HPI Caroline Williams is a 58 y.o. female is here from urgent care for approximate 24 hours of vomiting and diarrhea.  Patient states that she started with some right upper quadrant epigastric pain initially started having nonbloody nonbilious vomiting afterwards.  This went on multiple times and then she started having diarrhea that was also nonbloody.  She states that she feels unwell and had decreased urine output and feels dehydrated.  She knows she has kidney stones but no known gallbladder problems.  She went to urgent care to be evaluated and sent her here for further evaluation.  Patient states mild improvement with Zofran but no other changes.  No fevers or cough.  No rashes that are new.  States no sick contacts or suspicious food intake.  No urinary symptoms besides decreased urine. No other associated or modifying symptoms.    Past Medical History:  Diagnosis Date  . Anxiety   . Arthritis   . Asthma   . Bipolar 1 disorder ([place])   . Breast discharge   . Breast lump   . Breast pain   . Chronic pain    on MS Contin and oxycodone  . Depression   . Diabetes mellitus    diet and exercise controlled  . Escherichia coli (E. coli) infection   . Fever   . GERD (gastroesophageal reflux disease)   . History of chest pain   . History of kidney stones   . History of knee replacement, total   . Hypertension   . Hypothyroidism   . N&V (nausea and vomiting)   . Peripheral neuropathy    Right foot no sensation  . Pneumonia    2015,2014  . Sleep apnea    does not use CPAP  . Thyroid disease   . Wears glasses     Patient Active Problem List   Diagnosis Date Noted  . Change in bowel habits 03/14/2018  . Chest pain 01/26/2016  . Sepsis due to Escherichia coli ([place])   . E coli bacteremia   . E. coli UTI   . Hallucination,  drug-induced ([place]) 03/12/2015  . Leukocytosis 03/12/2015  . Hypokalemia 03/12/2015  . Hypothyroidism 03/12/2015  . UTI (urinary tract infection) 03/12/2015  . Sepsis due to Gram negative bacteria ([place])   . Gram-negative bacteremia 03/11/2015  . Lap gastric bypass June 2016 11/25/2014  . Diabetes mellitus with neuropathy ([place]) 06/20/2013  . Chronic pain of right knee 06/20/2013  . Morbid obesity ([place]) 07/05/2012    Past Surgical History:  Procedure Laterality Date  . ABDOMINAL HYSTERECTOMY     partial  . APPENDECTOMY    . BIOPSY  03/14/2018   Procedure: BIOPSY;  Surgeon: Wilford Corner, MD;  [facility];  Service: Endoscopy;;  . BREAST DUCTAL SYSTEM EXCISION Right 08/31/2017   Procedure: RIGHT BREAST CENTRAL DUCT EXCISION;  Surgeon: Erroll Luna, MD;  Location: [place];  Service: General;  Laterality: Right;  . BREAST EXCISIONAL BIOPSY Right   . BREAST LUMPECTOMY     right  . CARDIAC CATHETERIZATION     no significant CAD, nl LV function by 11/08/06 cath  . CESAREAN SECTION     x4  . COLONOSCOPY WITH PROPOFOL N/A 03/14/2018   Procedure: COLONOSCOPY WITH PROPOFOL;  Surgeon: Wilford Corner, MD;  [facility];  Service: Endoscopy;  Laterality: N/A;  . GASTRIC ROUX-EN-Y N/A 11/25/2014   Procedure: LAPAROSCOPIC ROUX-EN-Y GASTRIC BYPASS WITH UPPER ENDOSCOPY;  Surgeon: Johnathan Hausen, MD;  [facility];  Service: General;  Laterality: N/A;  . HERNIA REPAIR    . JOINT REPLACEMENT    . KNEE SURGERY     right  . MOUTH SURGERY    . [place]  . POLYPECTOMY  03/14/2018   Procedure: POLYPECTOMY;  Surgeon: Wilford Corner, MD;  [facility];  Service: Endoscopy;;  . TOTAL KNEE REVISION  06/21/2012   Procedure: TOTAL KNEE REVISION;  Surgeon: Newt Minion, MD;  Location: [place];  Service: Orthopedics;  Laterality: Right;  Revision Right Total Knee Arthroplasty    Current Outpatient Rx  . Order #:  [id number] Class: Historical Med  . Order #: [id number] Class: Historical Med  . Order #: [id number] Class: Historical Med  . Order #: [id number] Class: Historical Med  . Order #: [id number] Class: Historical Med  . Order #: [id number] Class: Historical Med  . Order #: [id number] Class: Historical Med  . Order #: [id number] Class: Normal  . Order #: [id number] Class: Historical Med  . Order #: [id number] Class: Historical Med  . Order #: [id number] Class: Historical Med  . Order #: [id number] Class: Historical Med  . Order #: [id number] Class: Historical Med  . Order #: [id number] Class: Historical Med  . Order #: [id number] Class: Historical Med  . Order #: [id number] Class: Print  . Order #: [id number] Class: Print  . Order #: [id number] Class: Historical Med  . Order #: [id number] Class: Normal  . Order #: [id number] Class: Historical Med  . Order #: [id number] Class: Historical Med  . Order #: [id number] Class: Print  . Order #: [id number] Class: Normal    Allergies Aspirin; Sulfa antibiotics; and Other  Family History  Problem Relation Age of Onset  . Cancer Father        colon  . Stroke Father   . Heart disease Father   . Diabetes Father   . Hypertension Father   . Depression Sister   . Hypertension Mother   . Breast cancer Neg Hx     Social History Social History   Tobacco Use  . Smoking status: Current Every Day Smoker    Packs/day: 0.50    Years: 43.00    Pack years: 21.50    Types: Cigarettes  . Smokeless tobacco: Never Used  Substance Use Topics  . Alcohol use: Yes    Comment: social  . Drug use: No    Review of Systems  All other systems negative except as documented in the HPI. All pertinent positives and negatives as reviewed in the HPI. ____________________________________________   PHYSICAL EXAM:  VITAL SIGNS: ED Triage Vitals [06/29/18 1821]  Enc Vitals Group     BP (!) 163/104     Pulse Rate 82     Resp 16     Temp 98.3 F (36.8 C)     Temp Source Oral     SpO2 98 %     Constitutional: Alert and oriented. Well appearing and in no acute distress. Eyes: Conjunctivae are normal. PERRL. EOMI. Head: Atraumatic. Nose: No congestion/rhinnorhea. Mouth/Throat: Mucous membranes are moist.  Oropharynx non-erythematous. Neck: No stridor.  No meningeal signs.   Cardiovascular: Normal rate, regular rhythm. Good peripheral circulation. Grossly normal heart sounds.   Respiratory: Normal respiratory effort.  No retractions. Lungs CTAB. Gastrointestinal: Soft but ttp in RUQ. No distention.  Musculoskeletal: No lower extremity tenderness nor edema. No gross deformities of extremities. Neurologic:  Normal speech and language. No gross  focal neurologic deficits are appreciated.  Skin:  Skin is warm, dry and intact. No rash noted.  ____________________________________________   LABS (all labs ordered are listed, but only abnormal results are displayed)  Labs Reviewed  COMPREHENSIVE METABOLIC PANEL - Abnormal; Notable for the following components:      Result Value   Potassium 3.4 (*)    BUN 5 (*)    Creatinine, Ser 1.11 (*)    GFR calc non Af Amer 55 (*)    All other components within normal limits  CBC - Abnormal; Notable for the following components:   RBC 5.68 (*)    Hemoglobin 15.2 (*)    HCT 48.1 (*)    RDW 16.3 (*)    All other components within normal limits  LIPASE, BLOOD  TROPONIN I  I-STAT BETA HCG BLOOD, ED ([facility], [facility], [facility] ONLY)   ____________________________________________  EKG   EKG Interpretation  Date/Time:  Thursday June 29 2018 21:08:25 EST Ventricular Rate:  73 PR Interval:  166 QRS Duration: 88 QT Interval:  408 QTC Calculation: 449 R Axis:   -26 Text Interpretation:  Normal sinus rhythm Possible Left atrial enlargement Borderline ECG No significant change since last tracing Confirmed by Merrily Pew [phone]) on 06/30/2018 2:03:45 AM       ____________________________________________  RADIOLOGY  Ct Abdomen Pelvis W  Contrast  Result Date: 06/30/2018 CLINICAL DATA:  58 year old female with abdominal pain. Nausea vomiting and diarrhea. EXAM: CT ABDOMEN AND PELVIS WITH CONTRAST TECHNIQUE: Multidetector CT imaging of the abdomen and pelvis was performed using the standard protocol following bolus administration of intravenous contrast. CONTRAST:  158mL OMNIPAQUE IOHEXOL 300 MG/ML  SOLN COMPARISON:  CT of the abdomen pelvis dated 05/20/2014 FINDINGS: Lower chest: Left lung base linear atelectasis/scarring. The visualized lung bases are otherwise clear. No intra-abdominal free air or free fluid. Hepatobiliary: Interval increase in the size of the segment 2 hypodense lesion which demonstrates fluid attenuation most consistent with a cyst and measuring 2.3 cm in diameter. This can be further characterized with MRI on a nonemergent basis. Additional subcentimeter hypodense lesion in the dome of the liver is too small to characterize. No intrahepatic biliary ductal dilatation. The gallbladder is unremarkable. Pancreas: Unremarkable. No pancreatic ductal dilatation or surrounding inflammatory changes. Spleen: Normal in size without focal abnormality. Adrenals/Urinary Tract: There is a 12 mm indeterminate right adrenal nodule, likely an adenoma. The left adrenal gland is unremarkable. Mild bilateral renal parenchyma atrophy and cortical thinning. Subcentimeter bilateral renal hypodensities are too small to characterize. There is no hydronephrosis on either side. The visualized ureters and urinary bladder appear unremarkable. Stomach/Bowel: Postsurgical changes of gastric bypass. There is no bowel obstruction or active inflammation. The appendix is not visualized and may be surgically absent. Vascular/Lymphatic: No significant vascular findings are present. No enlarged abdominal or pelvic lymph nodes. Reproductive: Hysterectomy. No pelvic mass. Other: Anterior pelvic wall incisional scar. No fluid collection. Musculoskeletal: Degenerative  changes of the spine. Chronic bilateral sacroiliitis. No acute osseous pathology. IMPRESSION: 1. No acute intra-abdominal or pelvic pathology. 2. Postsurgical changes of gastric bypass. No bowel obstruction or active inflammation. Electronically Signed   By: Anner Crete M.D.   On: 06/30/2018 03:26    ____________________________________________   PROCEDURES  Procedure(s) performed:   Procedures   ____________________________________________   INITIAL IMPRESSION / ASSESSMENT AND PLAN / ED COURSE  Symptoms and tenderness concerning for possible gallbladder issues.  Also has history of kidney stones and would consider that.  Will treat symptomatically and get  CT scan at this point.  Ct negative. Labs not c/w GB pathology, no stones noted on CT scan   Po challenged, improved symptoms. Exam improved. Will dc to fu w/ pcp for further management.      Pertinent labs & imaging results that were available during my care of the patient were reviewed by me and considered in my medical decision making (see chart for details).  ____________________________________________  FINAL CLINICAL IMPRESSION(S) / ED DIAGNOSES  Final diagnoses:  Generalized abdominal pain  Hypertension, unspecified type     MEDICATIONS GIVEN DURING THIS VISIT:  Medications  sodium chloride flush (NS) 0.9 % injection 3 mL (3 mLs Intravenous Given 06/30/18 0230)  ondansetron (ZOFRAN) tablet 4 mg (4 mg Oral Given 06/29/18 2355)  fentaNYL (SUBLIMAZE) injection 50 mcg (50 mcg Intravenous Given 06/30/18 0229)  lactated ringers bolus 1,000 mL (0 mLs Intravenous Stopped 06/30/18 0330)  metoCLOPramide (REGLAN) injection 10 mg (10 mg Intravenous Given 06/30/18 0229)  iohexol (OMNIPAQUE) 300 MG/ML solution 100 mL (100 mLs Intravenous Contrast Given 06/30/18 0245)     NEW OUTPATIENT MEDICATIONS STARTED DURING THIS VISIT:  Discharge Medication List as of 06/30/2018  5:08 AM    START taking these medications    Details  metoCLOPramide (REGLAN) 10 MG tablet Take 1 tablet (10 mg total) by mouth every 8 (eight) hours as needed for nausea., Starting Fri 06/30/2018, Print        Note:  This note was prepared with assistance of Dragon voice recognition software. Occasional wrong-word or sound-a-like substitutions may have occurred due to the inherent limitations of voice recognition software.   Merrily Pew, MD 06/30/18 2245

## 2018-06-30 NOTE — ED Notes (Signed)
Patient transported to CT 

## 2018-06-30 NOTE — ED Notes (Signed)
Patient verbalizes understanding of discharge instructions. Opportunity for questioning and answers were provided. Armband removed by staff, pt discharged from ED ambulatory.   

## 2018-07-06 ENCOUNTER — Ambulatory Visit: Payer: Medicare HMO | Admitting: Skilled Nursing Facility1

## 2018-07-26 ENCOUNTER — Encounter (HOSPITAL_COMMUNITY): Payer: Self-pay | Admitting: Emergency Medicine

## 2018-07-26 ENCOUNTER — Other Ambulatory Visit: Payer: Self-pay

## 2018-07-26 ENCOUNTER — Ambulatory Visit (HOSPITAL_COMMUNITY)
Admission: EM | Admit: 2018-07-26 | Discharge: 2018-07-26 | Disposition: A | Discharge status: Home / Self Care | Payer: Medicare HMO | Attending: Internal Medicine | Admitting: Internal Medicine

## 2018-07-26 DIAGNOSIS — M545 Low back pain, unspecified: Secondary | ICD-10-CM

## 2018-07-26 DIAGNOSIS — M25461 Effusion, right knee: Secondary | ICD-10-CM

## 2018-07-26 DIAGNOSIS — M25561 Pain in right knee: Secondary | ICD-10-CM

## 2018-07-26 DIAGNOSIS — G8929 Other chronic pain: Secondary | ICD-10-CM

## 2018-07-26 MED ORDER — PREDNISONE 10 MG (21) PO TBPK: ORAL_TABLET | Freq: Every day | ORAL | 0 refills | Status: DC

## 2018-07-26 MED ORDER — KETOROLAC TROMETHAMINE 60 MG/2ML IM SOLN
60.0000 mg | Freq: Once | INTRAMUSCULAR | Status: AC
  Administered 2018-07-26: 60 mg via INTRAMUSCULAR

## 2018-07-26 MED ORDER — CYCLOBENZAPRINE HCL 10 MG PO TABS: ORAL_TABLET | ORAL | 0 refills | Status: DC

## 2018-07-26 MED ORDER — KETOROLAC TROMETHAMINE 60 MG/2ML IM SOLN
INTRAMUSCULAR | Status: AC
  Filled 2018-07-26: qty 2

## 2018-07-26 NOTE — ED Triage Notes (Signed)
Back and right leg pain started 2 days ago.  Having difficulty walking on right leg today.

## 2018-07-26 NOTE — Discharge Instructions (Addendum)
You were given a shot of Toradol today to help with pain and swelling. Start oral Prednisone 10mg  tablets- take 6 tablets today then decrease by 1 tablet each day until finished. May continue Flexeril 10mg  tablets- take 1/2 to 1 whole tablet every 8 hours as needed. Continue other medication for pain and symptoms. Continue Physical Therapy as planned. Follow-up with your PCP in 4 to 5 days if not improving or go to the ER if symptoms worsen.

## 2018-07-26 NOTE — ED Provider Notes (Signed)
[place]    CSN: [id number] Arrival date & time: 07/26/18  0854     History   Chief Complaint Chief Complaint  Patient presents with  . Back Pain    HPI Caroline Williams is a 58 y.o. female.   58 year old female presents with acute swelling and increased pain of her right knee that started about 2 days ago. Also has chronic lower back pain that has flared up as well in the past 2 days. She has history of right knee replacement in 2010 with a revision in 2014 and continues to have chronic pain. She attends physical therapy regularly and her Physical therapist noticed the swelling. She is on various chronic pain medications including Lyrica, Oxycodone and Morphine for pain, as well as Flexeril and Voltaren gel. She usually has a flare up of the pain in her knee and back every 2 to 3 months and usually gets a Toradol injection and takes oral Prednisone with some success. Denies any radiation of pain toward her foot. Other chronic health issues include HTN, thyroid disease, asthma, depression, bipolar disorder, anxiety, GERD, sleep apnea, arthritis, and type 2 DM (controlled mainly with diet). Currently on Norvasc, Benicar HCT, Synthroid, Prilosec, Latuda, Linzess, Lexapro, Wellbutrin, Adderall and Doxepin daily (in addition to other pain medications previously listed) as well as Xanax, Reglan, and Albuterol prn.   The history is provided by the patient.    Past Medical History:  Diagnosis Date  . Anxiety   . Arthritis   . Asthma   . Bipolar 1 disorder ([place])   . Breast discharge   . Breast lump   . Breast pain   . Chronic pain    on MS Contin and oxycodone  . Depression   . Diabetes mellitus    diet and exercise controlled  . Escherichia coli (E. coli) infection   . Fever   . GERD (gastroesophageal reflux disease)   . History of chest pain   . History of kidney stones   . History of knee replacement, total   . Hypertension   . Hypothyroidism   . N&V (nausea and  vomiting)   . Peripheral neuropathy    Right foot no sensation  . Pneumonia    2015,2014  . Sleep apnea    does not use CPAP  . Thyroid disease   . Wears glasses     Patient Active Problem List   Diagnosis Date Noted  . Change in bowel habits 03/14/2018  . Chest pain 01/26/2016  . Sepsis due to Escherichia coli ([place])   . E coli bacteremia   . E. coli UTI   . Hallucination, drug-induced ([place]) 03/12/2015  . Leukocytosis 03/12/2015  . Hypokalemia 03/12/2015  . Hypothyroidism 03/12/2015  . UTI (urinary tract infection) 03/12/2015  . Sepsis due to Gram negative bacteria ([place])   . Gram-negative bacteremia 03/11/2015  . Lap gastric bypass June 2016 11/25/2014  . Diabetes mellitus with neuropathy ([place]) 06/20/2013  . Chronic pain of right knee 06/20/2013  . Morbid obesity ([place]) 07/05/2012    Past Surgical History:  Procedure Laterality Date  . ABDOMINAL HYSTERECTOMY     partial  . APPENDECTOMY    . BIOPSY  03/14/2018   Procedure: BIOPSY;  Surgeon: Wilford Corner, MD;  Location: [facility];  Service: Endoscopy;;  . BREAST DUCTAL SYSTEM EXCISION Right 08/31/2017   Procedure: RIGHT BREAST CENTRAL DUCT EXCISION;  Surgeon: Erroll Luna, MD;  Location: [facility];  Service: General;  Laterality: Right;  . BREAST EXCISIONAL BIOPSY Right   . BREAST LUMPECTOMY     right  . CARDIAC CATHETERIZATION     no significant CAD, nl LV function by 11/08/06 cath  . CESAREAN SECTION     x4  . COLONOSCOPY WITH PROPOFOL N/A 03/14/2018   Procedure: COLONOSCOPY WITH PROPOFOL;  Surgeon: Wilford Corner, MD;  Location: [facility];  Service: Endoscopy;  Laterality: N/A;  . GASTRIC ROUX-EN-Y N/A 11/25/2014   Procedure: LAPAROSCOPIC ROUX-EN-Y GASTRIC BYPASS WITH UPPER ENDOSCOPY;  Surgeon: Johnathan Hausen, MD;  Location: [facility];  Service: General;  Laterality: N/A;  . HERNIA REPAIR    . JOINT REPLACEMENT    . KNEE SURGERY     right  . MOUTH SURGERY    . [place]  . POLYPECTOMY  03/14/2018   Procedure: POLYPECTOMY;  Surgeon: Wilford Corner, MD;  Location: [facility];  Service: Endoscopy;;  . TOTAL KNEE REVISION  06/21/2012   Procedure: TOTAL KNEE REVISION;  Surgeon: Newt Minion, MD;  Location: [facility];  Service: Orthopedics;  Laterality: Right;  Revision Right Total Knee Arthroplasty    OB History   No obstetric history on file.      Home Medications    Prior to Admission medications   Medication Sig Start Date End Date Taking? Authorizing Provider  albuterol (PROVENTIL HFA;VENTOLIN HFA) 108 (90 BASE) MCG/ACT inhaler Inhale 2 puffs into the lungs every 6 (six) hours as needed for wheezing or shortness of breath. Shortness of breath    [provider]  ALPRAZolam (XANAX) 1 MG tablet Take 1 mg by mouth 4 (four) times daily.  07/02/14   [provider]  amLODipine (NORVASC) 5 MG tablet Take 5 mg by mouth daily. 06/15/16   [provider]  amphetamine-dextroamphetamine (ADDERALL XR) 30 MG 24 hr capsule Take 30 mg by mouth daily.  03/10/18   [provider]  amphetamine-dextroamphetamine (ADDERALL) 20 MG tablet Take 20 mg by mouth every evening.  10/13/17   [provider]  buPROPion (WELLBUTRIN XL) 300 MG 24 hr tablet Take 300 mg by mouth daily.    [provider]  Cyanocobalamin (VITAMIN B 12 PO) Take 1 tablet by mouth daily.     [provider]  cyclobenzaprine (FLEXERIL) 10 MG tablet Take 1/2 to 1 whole tablet by mouth every 8 hours as needed for muscle pain/spasms. 07/26/18   Katy Apo, NP  doxepin (SINEQUAN) 75 MG capsule Take 75 mg by mouth at bedtime. 03/02/18   [provider]  EPINEPHrine 0.3 mg/0.3 mL IJ SOAJ injection Inject 0.3 mg as directed as needed (severe allergic reaction).  01/11/14   [provider]  escitalopram (LEXAPRO) 20 MG tablet Take 20 mg by mouth daily.    [provider]  LATUDA 120 MG TABS Take 120 mg by mouth at  bedtime.  03/04/18   [provider]  levothyroxine (SYNTHROID, LEVOTHROID) 112 MCG tablet Take 112 mcg by mouth daily. 08/09/16   [provider]  linaclotide (LINZESS) 290 MCG CAPS capsule Take 290 mcg by mouth at bedtime.    [provider]  LYRICA 75 MG capsule Take 75 mg by mouth 2 (two) times daily.  08/18/17   [provider]  metoCLOPramide (REGLAN) 10 MG tablet Take 1 tablet (10 mg total) by mouth every 8 (eight) hours as needed for nausea. 06/30/18   Mesner, Corene Cornea, MD  morphine (MS CONTIN) 15 MG 12 hr tablet Take 1  tablet (15 mg total) by mouth every 12 (twelve) hours. 11/14/14   Bayard Hugger, NP  Multiple Vitamin (MULTIVITAMIN) tablet Take 1 tablet by mouth every morning.     [provider]  nitroGLYCERIN (NITROSTAT) 0.4 MG SL tablet DISSOLVE 1 TABLET UNDER THE TONGUE EVERY 5 MINUTES AS NEEDED Patient taking differently: Place 0.4 mg under the tongue every 5 (five) minutes as needed for chest pain.  01/27/16   Belva Crome, MD  olmesartan-hydrochlorothiazide (BENICAR HCT) 20-12.5 MG tablet Take 1 tablet by mouth daily.     [provider]  omeprazole (PRILOSEC) 40 MG capsule Take 40 mg by mouth daily.  08/14/17   [provider]  Oxycodone HCl 10 MG TABS Take 1 tablet (10 mg total) by mouth every 6 (six) hours as needed (for pain). 11/14/14   Bayard Hugger, NP  predniSONE (STERAPRED UNI-PAK 21 TAB) 10 MG (21) TBPK tablet Take by mouth daily. Take 6 tabs by mouth on the first day then decrease by 1 tablet each day until finished. 07/26/18   Katy Apo, NP  VOLTAREN 1 % GEL Apply 4 g topically 4 (four) times daily. 12/25/17   Tereasa Coop, PA-C  LISINOPRIL PO Take by mouth daily.    09/06/11  [provider]    Family History Family History  Problem Relation Age of Onset  . Cancer Father        colon  . Stroke Father   . Heart disease Father   . Diabetes Father   . Hypertension Father   . Depression Sister    . Hypertension Mother   . Breast cancer Neg Hx     Social History Social History   Tobacco Use  . Smoking status: Current Every Day Smoker    Packs/day: 0.50    Years: 43.00    Pack years: 21.50    Types: Cigarettes  . Smokeless tobacco: Never Used  Substance Use Topics  . Alcohol use: Yes    Comment: social  . Drug use: No     Allergies   Aspirin; Sulfa antibiotics; and Other   Review of Systems Review of Systems  Constitutional: Positive for fatigue. Negative for activity change, appetite change, chills and fever.  HENT: Negative for facial swelling, sore throat and trouble swallowing.   Respiratory: Negative for chest tightness and shortness of breath.   Cardiovascular: Negative for chest pain and palpitations.  Gastrointestinal: Positive for abdominal pain and nausea. Negative for diarrhea and vomiting.  Genitourinary: Negative for difficulty urinating, frequency, hematuria and urgency.  Musculoskeletal: Positive for arthralgias, back pain, gait problem, joint swelling, myalgias and neck pain. Negative for neck stiffness.  Skin: Negative for color change, rash and wound.  Neurological: Positive for weakness, light-headedness, numbness (right foot- chronic) and headaches. Negative for dizziness, tremors, seizures, syncope and speech difficulty.  Hematological: Negative for adenopathy. Does not bruise/bleed easily.  Psychiatric/Behavioral: Positive for dysphoric mood and sleep disturbance.     Physical Exam Triage Vital Signs ED Triage Vitals  Enc Vitals Group     BP 07/26/18 0948 (!) 172/112     Pulse Rate 07/26/18 0948 73     Resp 07/26/18 0948 18     Temp 07/26/18 0948 97.9 F (36.6 C)     Temp Source 07/26/18 0948 Oral     SpO2 07/26/18 0948 100 %     Weight --      Height --      Head Circumference --  Peak Flow --      Pain Score 07/26/18 0957 9     Pain Loc --      Pain Edu? --      Excl. in [place]? --    No data found.  Updated Vital  Signs BP (!) 172/112 (BP Location: Right Arm)   Pulse 73   Temp 97.9 F (36.6 C) (Oral)   Resp 18   SpO2 100%   Visual Acuity Right Eye Distance:   Left Eye Distance:   Bilateral Distance:    Right Eye Near:   Left Eye Near:    Bilateral Near:     Physical Exam Vitals signs and nursing note reviewed.  Constitutional:      General: She is not in acute distress.    Appearance: She is well-developed and overweight. She is ill-appearing.     Comments: Patient unable to walk without assistance- needed wheelchair to gain access to exam room. Currently laying down on exam table in no acute distress  But appears in pain.   HENT:     Head: Normocephalic and atraumatic.     Right Ear: Hearing and external ear normal.     Left Ear: Hearing and external ear normal.     Nose: Nose normal.  Eyes:     Extraocular Movements: Extraocular movements intact.     Conjunctiva/sclera: Conjunctivae normal.  Neck:     Musculoskeletal: Neck supple. Muscular tenderness present.  Cardiovascular:     Rate and Rhythm: Normal rate and regular rhythm.  Pulmonary:     Effort: Pulmonary effort is normal. No respiratory distress.     Breath sounds: No wheezing.  Musculoskeletal:        General: Swelling and tenderness present.     Right knee: She exhibits decreased range of motion and swelling. She exhibits no ecchymosis and no erythema. Tenderness found. Medial joint line tenderness noted.     Lumbar back: She exhibits decreased range of motion, tenderness, pain and spasm. She exhibits no swelling and no edema.       Back:       Legs:     Comments: Has decreased ROM of both right knee and back. Very tender to palpation along lower lumbar area bilaterally. No distinct swelling or redness seen.  Decreased ROM of right knee. Swelling present medial and just distal to patella. (see photo- zoom out to appreciate overall swelling) Very tender. No redness or warmth present. No distinct signs of infection. Scar  present from previous surgery. No neuro deficits of right leg but right foot has decreased sensation. Normal distal pulses bilaterally.   Skin:    General: Skin is warm and dry.     Capillary Refill: Capillary refill takes less than 2 seconds.     Findings: No erythema or rash.  Neurological:     Mental Status: She is alert and oriented to person, place, and time.     Cranial Nerves: Cranial nerves are intact.     Sensory: Sensory deficit (right foot) present.     Motor: Motor function is intact.     Deep Tendon Reflexes: Reflexes are normal and symmetric.     Comments: Unable to put pressure on right knee.   Psychiatric:        Attention and Perception: Attention normal.        Mood and Affect: Affect is blunt.        Speech: Speech normal.        Behavior:  Behavior is cooperative.        Thought Content: Thought content normal.        UC Treatments / Results  Labs (all labs ordered are listed, but only abnormal results are displayed) Labs Reviewed - No data to display  EKG None  Radiology No results found.  Procedures Procedures (including critical care time)  Medications Ordered in UC Medications  ketorolac (TORADOL) injection 60 mg (60 mg Intramuscular Given 07/26/18 1054)    Initial Impression / Assessment and Plan / UC Course  I have reviewed the triage vital signs and the nursing notes.  Pertinent labs & imaging results that were available during my care of the patient were reviewed by me and considered in my medical decision making (see chart for details).    Discussed with patient no signs of infection present. Will treat for musculoskeletal inflammation. Gave Toradol 60mg  IM now. Recommend start Prednisone 10mg  6 day dose pack as directed. May continue Flexeril 10mg  1/2 to 1 tablet every 8 hours as needed for pain and muscle spasms. Continue physical therapy as directed. Continue other pain medications as directed. Patient requests knee immobilizer since this  has helped in the past with the pain and swelling. Note written for work. Follow-up with her PCP in 4 to 5 days if not improving or go to the ER if symptoms worsen.  Final Clinical Impressions(s) / UC Diagnoses   Final diagnoses:  Chronic pain of right knee  Chronic bilateral low back pain without sciatica  Pain and swelling of right knee     Discharge Instructions     You were given a shot of Toradol today to help with pain and swelling. Start oral Prednisone 10mg  tablets- take 6 tablets today then decrease by 1 tablet each day until finished. May continue Flexeril 10mg  tablets- take 1/2 to 1 whole tablet every 8 hours as needed. Continue other medication for pain and symptoms. Continue Physical Therapy as planned. Follow-up with your PCP in 4 to 5 days if not improving or go to the ER if symptoms worsen.     ED Prescriptions    Medication Sig Dispense Auth. Provider   cyclobenzaprine (FLEXERIL) 10 MG tablet Take 1/2 to 1 whole tablet by mouth every 8 hours as needed for muscle pain/spasms. 20 tablet Katy Apo, NP   predniSONE (STERAPRED UNI-PAK 21 TAB) 10 MG (21) TBPK tablet Take by mouth daily. Take 6 tabs by mouth on the first day then decrease by 1 tablet each day until finished. 21 tablet Katy Apo, NP     Controlled Substance Prescriptions [place] Controlled Substance Registry consulted? Yes. I have consulted the  Controlled Substance Registry to confirm the medications patient is on. No additional controlled substances prescribed today.    Katy Apo, NP 07/27/18 [phone]

## 2018-09-14 ENCOUNTER — Encounter (HOSPITAL_COMMUNITY): Payer: Self-pay

## 2018-09-14 ENCOUNTER — Other Ambulatory Visit: Payer: Self-pay

## 2018-09-14 ENCOUNTER — Ambulatory Visit (HOSPITAL_COMMUNITY)
Admission: EM | Admit: 2018-09-14 | Discharge: 2018-09-14 | Disposition: A | Discharge status: Home / Self Care | Payer: Medicare HMO | Attending: Family Medicine | Admitting: Family Medicine

## 2018-09-14 DIAGNOSIS — G8929 Other chronic pain: Secondary | ICD-10-CM | POA: Diagnosis not present

## 2018-09-14 DIAGNOSIS — M25561 Pain in right knee: Secondary | ICD-10-CM | POA: Diagnosis not present

## 2018-09-14 MED ORDER — PREDNISONE 10 MG (21) PO TBPK: ORAL_TABLET | Freq: Every day | ORAL | 0 refills | Status: DC

## 2018-09-14 MED ORDER — KETOROLAC TROMETHAMINE 60 MG/2ML IM SOLN
INTRAMUSCULAR | Status: AC
  Filled 2018-09-14: qty 2

## 2018-09-14 MED ORDER — KETOROLAC TROMETHAMINE 60 MG/2ML IM SOLN
60.0000 mg | Freq: Once | INTRAMUSCULAR | Status: AC
  Administered 2018-09-14: 60 mg via INTRAMUSCULAR

## 2018-09-14 NOTE — ED Triage Notes (Signed)
Pt states she has right knee pain x 3 days. Pt had a knee replacement in 2011.

## 2018-09-14 NOTE — Discharge Instructions (Signed)
Toradol injection given here for chronic knee pain Sending steroid pack to the pharmacy If your symptoms continue you need to follow-up with your orthopedic for further evaluation and management

## 2018-09-14 NOTE — ED Provider Notes (Signed)
[place]    CSN: [id number] Arrival date & time: 09/14/18  0854     History   Chief Complaint Chief Complaint  Patient presents with  . Knee Pain    HPI Caroline Williams is a 58 y.o. female.   Pt is a 58 year old female that presents with chronic right knee pain. She has had a knee replacement in that knee back in 2011. Reports that the knee flares from time to time. She has mild swelling. She was last seen here on 07/26/18 for same problem. She was then given a Toradol injection and steroid pack. She reports that this helped her tremendously. She denies any injury to the knee. No fever, chills. She takes chronic pain medication daily and this is not helping the pain.   ROS per HPI      Past Medical History:  Diagnosis Date  . Anxiety   . Arthritis   . Asthma   . Bipolar 1 disorder ([place])   . Breast discharge   . Breast lump   . Breast pain   . Chronic pain    on MS Contin and oxycodone  . Depression   . Diabetes mellitus    diet and exercise controlled  . Escherichia coli (E. coli) infection   . Fever   . GERD (gastroesophageal reflux disease)   . History of chest pain   . History of kidney stones   . History of knee replacement, total   . Hypertension   . Hypothyroidism   . N&V (nausea and vomiting)   . Peripheral neuropathy    Right foot no sensation  . Pneumonia    2015,2014  . Sleep apnea    does not use CPAP  . Thyroid disease   . Wears glasses     Patient Active Problem List   Diagnosis Date Noted  . Change in bowel habits 03/14/2018  . Chest pain 01/26/2016  . Sepsis due to Escherichia coli ([place])   . E coli bacteremia   . E. coli UTI   . Hallucination, drug-induced ([place]) 03/12/2015  . Leukocytosis 03/12/2015  . Hypokalemia 03/12/2015  . Hypothyroidism 03/12/2015  . UTI (urinary tract infection) 03/12/2015  . Sepsis due to Gram negative bacteria ([place])   . Gram-negative bacteremia 03/11/2015  . Lap gastric bypass June 2016  11/25/2014  . Diabetes mellitus with neuropathy ([place]) 06/20/2013  . Chronic pain of right knee 06/20/2013  . Morbid obesity ([place]) 07/05/2012    Past Surgical History:  Procedure Laterality Date  . ABDOMINAL HYSTERECTOMY     partial  . APPENDECTOMY    . BIOPSY  03/14/2018   Procedure: BIOPSY;  Surgeon: Wilford Corner, MD;  [facility];  Service: Endoscopy;;  . BREAST DUCTAL SYSTEM EXCISION Right 08/31/2017   Procedure: RIGHT BREAST CENTRAL DUCT EXCISION;  Surgeon: Erroll Luna, MD;  Location: [place];  Service: General;  Laterality: Right;  . BREAST EXCISIONAL BIOPSY Right   . BREAST LUMPECTOMY     right  . CARDIAC CATHETERIZATION     no significant CAD, nl LV function by 11/08/06 cath  . CESAREAN SECTION     x4  . COLONOSCOPY WITH PROPOFOL N/A 03/14/2018   Procedure: COLONOSCOPY WITH PROPOFOL;  Surgeon: Wilford Corner, MD;  [facility];  Service: Endoscopy;  Laterality: N/A;  . GASTRIC ROUX-EN-Y N/A 11/25/2014   Procedure: LAPAROSCOPIC ROUX-EN-Y GASTRIC BYPASS WITH UPPER ENDOSCOPY;  Surgeon: Johnathan Hausen, MD;  [facility];  Service:  General;  Laterality: N/A;  . HERNIA REPAIR    . JOINT REPLACEMENT    . KNEE SURGERY     right  . MOUTH SURGERY    . [place]  . POLYPECTOMY  03/14/2018   Procedure: POLYPECTOMY;  Surgeon: Wilford Corner, MD;  [facility];  Service: Endoscopy;;  . TOTAL KNEE REVISION  06/21/2012   Procedure: TOTAL KNEE REVISION;  Surgeon: Newt Minion, MD;  Location: [facility];  Service: Orthopedics;  Laterality: Right;  Revision Right Total Knee Arthroplasty    OB History   No obstetric history on file.      Home Medications    Prior to Admission medications   Medication Sig Start Date End Date Taking? Authorizing Provider  albuterol (PROVENTIL HFA;VENTOLIN HFA) 108 (90 BASE) MCG/ACT inhaler Inhale 2 puffs into the lungs every 6 (six) hours as needed for wheezing or  shortness of breath. Shortness of breath    [provider]  ALPRAZolam (XANAX) 1 MG tablet Take 1 mg by mouth 4 (four) times daily.  07/02/14   [provider]  amLODipine (NORVASC) 5 MG tablet Take 5 mg by mouth daily. 06/15/16   [provider]  amphetamine-dextroamphetamine (ADDERALL XR) 30 MG 24 hr capsule Take 30 mg by mouth daily.  03/10/18   [provider]  amphetamine-dextroamphetamine (ADDERALL) 20 MG tablet Take 20 mg by mouth every evening.  10/13/17   [provider]  buPROPion (WELLBUTRIN XL) 300 MG 24 hr tablet Take 300 mg by mouth daily.    [provider]  Cyanocobalamin (VITAMIN B 12 PO) Take 1 tablet by mouth daily.     [provider]  cyclobenzaprine (FLEXERIL) 10 MG tablet Take 1/2 to 1 whole tablet by mouth every 8 hours as needed for muscle pain/spasms. 07/26/18   Katy Apo, NP  doxepin (SINEQUAN) 75 MG capsule Take 75 mg by mouth at bedtime. 03/02/18   [provider]  EPINEPHrine 0.3 mg/0.3 mL IJ SOAJ injection Inject 0.3 mg as directed as needed (severe allergic reaction).  01/11/14   [provider]  escitalopram (LEXAPRO) 20 MG tablet Take 20 mg by mouth daily.    [provider]  LATUDA 120 MG TABS Take 120 mg by mouth at bedtime.  03/04/18   [provider]  levothyroxine (SYNTHROID, LEVOTHROID) 112 MCG tablet Take 112 mcg by mouth daily. 08/09/16   [provider]  linaclotide (LINZESS) 290 MCG CAPS capsule Take 290 mcg by mouth at bedtime.    [provider]  LYRICA 75 MG capsule Take 75 mg by mouth 2 (two) times daily.  08/18/17   [provider]  metoCLOPramide (REGLAN) 10 MG tablet Take 1 tablet (10 mg total) by mouth every 8 (eight) hours as needed for nausea. 06/30/18   Mesner, Corene Cornea, MD  morphine (MS CONTIN) 15 MG 12 hr tablet Take 1 tablet (15 mg total) by mouth every 12 (twelve) hours. 11/14/14   Bayard Hugger, NP  Multiple Vitamin  (MULTIVITAMIN) tablet Take 1 tablet by mouth every morning.     [provider]  nitroGLYCERIN (NITROSTAT) 0.4 MG SL tablet DISSOLVE 1 TABLET UNDER THE TONGUE EVERY 5 MINUTES AS NEEDED Patient taking differently: Place 0.4 mg under the tongue every 5 (five) minutes as needed for chest pain.  01/27/16   Belva Crome, MD  olmesartan-hydrochlorothiazide (BENICAR HCT) 20-12.5 MG tablet Take 1 tablet by mouth daily.     [provider]  omeprazole (PRILOSEC) 40 MG capsule Take 40 mg by mouth daily.  08/14/17   [provider]  Oxycodone HCl 10 MG TABS Take 1 tablet (10 mg total) by mouth every 6 (six) hours as needed (for pain). 11/14/14   Bayard Hugger, NP  predniSONE (STERAPRED UNI-PAK 21 TAB) 10 MG (21) TBPK tablet Take by mouth daily. Take 6 tabs by mouth on the first day then decrease by 1 tablet each day until finished. 09/14/18   Anterrio Mccleery, Tressia Miners A, NP  VOLTAREN 1 % GEL Apply 4 g topically 4 (four) times daily. 12/25/17   Tereasa Coop, PA-C  LISINOPRIL PO Take by mouth daily.    09/06/11  [provider]    Family History Family History  Problem Relation Age of Onset  . Cancer Father        colon  . Stroke Father   . Heart disease Father   . Diabetes Father   . Hypertension Father   . Depression Sister   . Hypertension Mother   . Breast cancer Neg Hx     Social History Social History   Tobacco Use  . Smoking status: Current Every Day Smoker    Packs/day: 0.50    Years: 43.00    Pack years: 21.50    Types: Cigarettes  . Smokeless tobacco: Never Used  Substance Use Topics  . Alcohol use: Yes    Comment: social  . Drug use: No     Allergies   Aspirin; Sulfa antibiotics; and Other   Review of Systems Review of Systems   Physical Exam Triage Vital Signs ED Triage Vitals  Enc Vitals Group     BP 09/14/18 0915 129/81     Pulse Rate 09/14/18 0915 89     Resp 09/14/18 0915 18     Temp 09/14/18 0915 98 F (36.7 C)     Temp Source  09/14/18 0915 Oral     SpO2 09/14/18 0915 99 %     Weight 09/14/18 0917 220 lb (99.8 kg)     Height --      Head Circumference --      Peak Flow --      Pain Score 09/14/18 0916 8     Pain Loc --      Pain Edu? --      Excl. in [place]? --    No data found.  Updated Vital Signs BP 129/81 (BP Location: Left Arm)   Pulse 89   Temp 98 F (36.7 C) (Oral)   Resp 18   Wt 220 lb (99.8 kg)   SpO2 99%   BMI 36.61 kg/m   Visual Acuity Right Eye Distance:   Left Eye Distance:   Bilateral Distance:    Right Eye Near:   Left Eye Near:    Bilateral Near:     Physical Exam Vitals signs and nursing note reviewed.  Constitutional:      General: She is not in acute distress.    Appearance: Normal appearance. She is not ill-appearing, toxic-appearing or diaphoretic.  HENT:     Head: Normocephalic and atraumatic.     Nose: Nose normal.  Eyes:     Conjunctiva/sclera: Conjunctivae normal.  Neck:     Musculoskeletal: Normal range of motion.  Cardiovascular:     Pulses: Normal pulses.  Musculoskeletal:        General: Swelling and tenderness present.     Comments: Swelling and tenderness mostly to the medial aspect  of the right knee.  No bruising, erythema or increased warmth. Limited range of motion due to pain  Skin:    General: Skin is warm and dry.  Neurological:     Mental Status: She is alert.  Psychiatric:        Mood and Affect: Mood normal.      UC Treatments / Results  Labs (all labs ordered are listed, but only abnormal results are displayed) Labs Reviewed - No data to display  EKG None  Radiology No results found.  Procedures Procedures (including critical care time)  Medications Ordered in UC Medications  ketorolac (TORADOL) injection 60 mg (60 mg Intramuscular Given 09/14/18 0936)    Initial Impression / Assessment and Plan / UC Course  I have reviewed the triage vital signs and the nursing notes.  Pertinent labs & imaging results that were available  during my care of the patient were reviewed by me and considered in my medical decision making (see chart for details).     Chronic knee pain Toradol injection given here in clinic.  Prednisone taper sent to pharmacy Instructed to follow-up with her orthopedic for continued or worsening problems Final Clinical Impressions(s) / UC Diagnoses   Final diagnoses:  Chronic pain of right knee     Discharge Instructions     Toradol injection given here for chronic knee pain Sending steroid pack to the pharmacy If your symptoms continue you need to follow-up with your orthopedic for further evaluation and management    ED Prescriptions    Medication Sig Dispense Auth. Provider   predniSONE (STERAPRED UNI-PAK 21 TAB) 10 MG (21) TBPK tablet Take by mouth daily. Take 6 tabs by mouth on the first day then decrease by 1 tablet each day until finished. 21 tablet Loura Halt A, NP     Controlled Substance Prescriptions [place] Controlled Substance Registry consulted? Not Applicable   Orvan July, NP 09/14/18 1008

## 2018-10-26 ENCOUNTER — Other Ambulatory Visit: Payer: Self-pay

## 2018-10-26 ENCOUNTER — Ambulatory Visit (INDEPENDENT_AMBULATORY_CARE_PROVIDER_SITE_OTHER): Payer: Medicare HMO

## 2018-10-26 ENCOUNTER — Encounter (HOSPITAL_COMMUNITY): Payer: Self-pay | Admitting: Emergency Medicine

## 2018-10-26 ENCOUNTER — Ambulatory Visit (HOSPITAL_COMMUNITY)
Admission: EM | Admit: 2018-10-26 | Discharge: 2018-10-26 | Disposition: A | Discharge status: Home / Self Care | Payer: Medicare HMO | Attending: Internal Medicine | Admitting: Internal Medicine

## 2018-10-26 ENCOUNTER — Ambulatory Visit (HOSPITAL_COMMUNITY): Payer: Medicare HMO

## 2018-10-26 DIAGNOSIS — W19XXXA Unspecified fall, initial encounter: Secondary | ICD-10-CM

## 2018-10-26 DIAGNOSIS — M25561 Pain in right knee: Secondary | ICD-10-CM

## 2018-10-26 DIAGNOSIS — M5489 Other dorsalgia: Secondary | ICD-10-CM | POA: Diagnosis not present

## 2018-10-26 DIAGNOSIS — M542 Cervicalgia: Secondary | ICD-10-CM

## 2018-10-26 MED ORDER — KETOROLAC TROMETHAMINE 30 MG/ML IJ SOLN
30.0000 mg | Freq: Once | INTRAMUSCULAR | Status: AC
  Administered 2018-10-26: 10:00:00 30 mg via INTRAMUSCULAR

## 2018-10-26 MED ORDER — KETOROLAC TROMETHAMINE 30 MG/ML IJ SOLN
INTRAMUSCULAR | Status: AC
  Filled 2018-10-26: qty 1

## 2018-10-26 NOTE — ED Notes (Signed)
Patient refused the cervical and lumbar spine x-ray exams

## 2018-10-26 NOTE — ED Provider Notes (Signed)
[place]    CSN: [id number] Arrival date & time: 10/26/18  0813     History   Chief Complaint Chief Complaint  Patient presents with  . Fall    HPI Caroline Williams is a 58 y.o. female with a history of asthma-controlled, bipolar disorder-controlled on Lat[place] comes to urgent care after she sustained a fall this morning.  Patient slipped and fell.  She landed on her right knee.  She is complaining about pain in the right knee mid back and also the neck.  Pain is constant.  Moderate severity of 7/10.  Aggravated by movement.  No known relieving factors.  She denies any numbness or tingling.  No shortness of breath.  Patient had right knee replacement in the past.   No dizziness.  No loss of consciousness.  Patient did not hit her head.  HPI  Past Medical History:  Diagnosis Date  . Anxiety   . Arthritis   . Asthma   . Bipolar 1 disorder ([place])   . Breast discharge   . Breast lump   . Breast pain   . Chronic pain    on MS Contin and oxycodone  . Depression   . Diabetes mellitus    diet and exercise controlled  . Escherichia coli (E. coli) infection   . Fever   . GERD (gastroesophageal reflux disease)   . History of chest pain   . History of kidney stones   . History of knee replacement, total   . Hypertension   . Hypothyroidism   . N&V (nausea and vomiting)   . Peripheral neuropathy    Right foot no sensation  . Pneumonia    2015,2014  . Sleep apnea    does not use CPAP  . Thyroid disease   . Wears glasses     Patient Active Problem List   Diagnosis Date Noted  . Change in bowel habits 03/14/2018  . Chest pain 01/26/2016  . Sepsis due to Escherichia coli ([place])   . E coli bacteremia   . E. coli UTI   . Hallucination, drug-induced ([place]) 03/12/2015  . Leukocytosis 03/12/2015  . Hypokalemia 03/12/2015  . Hypothyroidism 03/12/2015  . UTI (urinary tract infection) 03/12/2015  . Sepsis due to Gram negative bacteria ([place])   . Gram-negative  bacteremia 03/11/2015  . Lap gastric bypass June 2016 11/25/2014  . Diabetes mellitus with neuropathy ([place]) 06/20/2013  . Chronic pain of right knee 06/20/2013  . Morbid obesity ([place]) 07/05/2012    Past Surgical History:  Procedure Laterality Date  . ABDOMINAL HYSTERECTOMY     partial  . APPENDECTOMY    . BIOPSY  03/14/2018   Procedure: BIOPSY;  Surgeon: Wilford Corner, MD;  [facility];  Service: Endoscopy;;  . BREAST DUCTAL SYSTEM EXCISION Right 08/31/2017   Procedure: RIGHT BREAST CENTRAL DUCT EXCISION;  Surgeon: Erroll Luna, MD;  Location: [facility];  Service: General;  Laterality: Right;  . BREAST EXCISIONAL BIOPSY Right   . BREAST LUMPECTOMY     right  . CARDIAC CATHETERIZATION     no significant CAD, nl LV function by 11/08/06 cath  . CESAREAN SECTION     x4  . COLONOSCOPY WITH PROPOFOL N/A 03/14/2018   Procedure: COLONOSCOPY WITH PROPOFOL;  Surgeon: Wilford Corner, MD;  [facility];  Service: Endoscopy;  Laterality: N/A;  . GASTRIC ROUX-EN-Y N/A 11/25/2014   Procedure: LAPAROSCOPIC ROUX-EN-Y GASTRIC BYPASS WITH UPPER ENDOSCOPY;  Surgeon: Johnathan Hausen, MD;  [facility];  Service: General;  Laterality: N/A;  . HERNIA REPAIR    . JOINT REPLACEMENT    . KNEE SURGERY     right  . MOUTH SURGERY    . [place]  . POLYPECTOMY  03/14/2018   Procedure: POLYPECTOMY;  Surgeon: Wilford Corner, MD;  [facility];  Service: Endoscopy;;  . TOTAL KNEE REVISION  06/21/2012   Procedure: TOTAL KNEE REVISION;  Surgeon: Newt Minion, MD;  Location: [facility];  Service: Orthopedics;  Laterality: Right;  Revision Right Total Knee Arthroplasty    OB History   No obstetric history on file.      Home Medications    Prior to Admission medications   Medication Sig Start Date End Date Taking? Authorizing Provider  albuterol (PROVENTIL HFA;VENTOLIN HFA) 108 (90 BASE) MCG/ACT inhaler Inhale 2 puffs into the  lungs every 6 (six) hours as needed for wheezing or shortness of breath. Shortness of breath    [provider]  ALPRAZolam (XANAX) 1 MG tablet Take 1 mg by mouth 4 (four) times daily.  07/02/14   [provider]  amLODipine (NORVASC) 5 MG tablet Take 5 mg by mouth daily. 06/15/16   [provider]  amphetamine-dextroamphetamine (ADDERALL XR) 30 MG 24 hr capsule Take 30 mg by mouth daily.  03/10/18   [provider]  amphetamine-dextroamphetamine (ADDERALL) 20 MG tablet Take 20 mg by mouth every evening.  10/13/17   [provider]  buPROPion (WELLBUTRIN XL) 300 MG 24 hr tablet Take 300 mg by mouth daily.    [provider]  Cyanocobalamin (VITAMIN B 12 PO) Take 1 tablet by mouth daily.     [provider]  cyclobenzaprine (FLEXERIL) 10 MG tablet Take 1/2 to 1 whole tablet by mouth every 8 hours as needed for muscle pain/spasms. 07/26/18   Katy Apo, NP  doxepin (SINEQUAN) 75 MG capsule Take 75 mg by mouth at bedtime. 03/02/18   [provider]  EPINEPHrine 0.3 mg/0.3 mL IJ SOAJ injection Inject 0.3 mg as directed as needed (severe allergic reaction).  01/11/14   [provider]  escitalopram (LEXAPRO) 20 MG tablet Take 20 mg by mouth daily.    [provider]  LATUDA 120 MG TABS Take 120 mg by mouth at bedtime.  03/04/18   [provider]  levothyroxine (SYNTHROID, LEVOTHROID) 112 MCG tablet Take 112 mcg by mouth daily. 08/09/16   [provider]  linaclotide (LINZESS) 290 MCG CAPS capsule Take 290 mcg by mouth at bedtime.    [provider]  LYRICA 75 MG capsule Take 75 mg by mouth 2 (two) times daily.  08/18/17   [provider]  metoCLOPramide (REGLAN) 10 MG tablet Take 1 tablet (10 mg total) by mouth every 8 (eight) hours as needed for nausea. 06/30/18   Mesner, Corene Cornea, MD  morphine (MS CONTIN) 15 MG 12 hr tablet Take 1 tablet (15 mg total) by mouth every 12 (twelve) hours.  11/14/14   Bayard Hugger, NP  Multiple Vitamin (MULTIVITAMIN) tablet Take 1 tablet by mouth every morning.     [provider]  nitroGLYCERIN (NITROSTAT) 0.4 MG SL tablet DISSOLVE 1 TABLET UNDER THE TONGUE EVERY 5 MINUTES AS NEEDED Patient taking differently: Place 0.4 mg under the tongue every 5 (five) minutes as needed for chest pain.  01/27/16   Belva Crome, MD  olmesartan-hydrochlorothiazide (BENICAR HCT) 20-12.5 MG tablet Take 1 tablet by mouth daily.  [provider]  omeprazole (PRILOSEC) 40 MG capsule Take 40 mg by mouth daily.  08/14/17   [provider]  Oxycodone HCl 10 MG TABS Take 1 tablet (10 mg total) by mouth every 6 (six) hours as needed (for pain). 11/14/14   Bayard Hugger, NP  predniSONE (STERAPRED UNI-PAK 21 TAB) 10 MG (21) TBPK tablet Take by mouth daily. Take 6 tabs by mouth on the first day then decrease by 1 tablet each day until finished. 09/14/18   Bast, Tressia Miners A, NP  VOLTAREN 1 % GEL Apply 4 g topically 4 (four) times daily. 12/25/17   Tereasa Coop, PA-C  LISINOPRIL PO Take by mouth daily.    09/06/11  [provider]    Family History Family History  Problem Relation Age of Onset  . Cancer Father        colon  . Stroke Father   . Heart disease Father   . Diabetes Father   . Hypertension Father   . Depression Sister   . Hypertension Mother   . Breast cancer Neg Hx     Social History Social History   Tobacco Use  . Smoking status: Current Every Day Smoker    Packs/day: 0.50    Years: 43.00    Pack years: 21.50    Types: Cigarettes  . Smokeless tobacco: Never Used  Substance Use Topics  . Alcohol use: Yes    Comment: social  . Drug use: No     Allergies   Aspirin; Sulfa antibiotics; and Other   Review of Systems Review of Systems  Constitutional: Negative.   HENT: Negative.   Eyes: Negative.   Respiratory: Negative for cough, chest tightness, shortness of breath and wheezing.   Cardiovascular:  Negative.   Gastrointestinal: Negative for abdominal pain, diarrhea, nausea and rectal pain.  Genitourinary: Negative.   Musculoskeletal: Positive for arthralgias, back pain, joint swelling, myalgias and neck pain. Negative for gait problem and neck stiffness.  Neurological: Negative.   Psychiatric/Behavioral: Negative.   All other systems reviewed and are negative.    Physical Exam Triage Vital Signs ED Triage Vitals  Enc Vitals Group     BP 10/26/18 0845 (!) 171/93     Pulse Rate 10/26/18 0845 77     Resp 10/26/18 0845 16     Temp 10/26/18 0845 98.1 F (36.7 C)     Temp Source 10/26/18 0845 Oral     SpO2 10/26/18 0845 98 %     Weight --      Height --      Head Circumference --      Peak Flow --      Pain Score 10/26/18 0847 9     Pain Loc --      Pain Edu? --      Excl. in [place]? --    No data found.  Updated Vital Signs BP (!) 171/93 (BP Location: Left Arm) Comment: Reported BP to Nurse Kim Lapan-Hutchens  Pulse 77   Temp 98.1 F (36.7 C) (Oral)   Resp 16   SpO2 98%   Visual Acuity Right Eye Distance:   Left Eye Distance:   Bilateral Distance:    Right Eye Near:   Left Eye Near:    Bilateral Near:     Physical Exam Constitutional:      General: She is in acute distress.     Appearance: Normal appearance. She is not ill-appearing.  HENT:     Mouth/Throat:  Mouth: Mucous membranes are moist.  Cardiovascular:     Rate and Rhythm: Normal rate and regular rhythm.     Pulses: Normal pulses.     Heart sounds: Normal heart sounds.  Pulmonary:     Effort: Pulmonary effort is normal.     Breath sounds: Normal breath sounds.  Abdominal:     General: Bowel sounds are normal.     Palpations: Abdomen is soft.  Musculoskeletal:     Comments: Mild swelling on the medial aspect of the right knee patient has full range of motion around the right knee.  Point tenderness in the right paraspinal muscle area.  Skin:    General: Skin is warm.  Neurological:      Mental Status: She is alert.      UC Treatments / Results  Labs (all labs ordered are listed, but only abnormal results are displayed) Labs Reviewed - No data to display  EKG None  Radiology No results found.  Procedures Procedures (including critical care time)  Medications Ordered in UC Medications  ketorolac (TORADOL) 30 MG/ML injection 30 mg (30 mg Intramuscular Given 10/26/18 0940)    Initial Impression / Assessment and Plan / UC Course  I have reviewed the triage vital signs and the nursing notes.  Pertinent labs & imaging results that were available during my care of the patient were reviewed by me and considered in my medical decision making (see chart for details).     1.  Fall with right knee pain: Patient was offered x-ray of the right knee, lumbar spine and cervical spine. Patient refused x-rays of the lumbar spine and cervical spine. X-ray of the right knee was negative for any acute fracture.  Prosthetic joint was in place. Patient will continue current pain medication regimen. She was given a dose of Toradol IM. Final Clinical Impressions(s) / UC Diagnoses   Final diagnoses:  None   Discharge Instructions   None    ED Prescriptions    None     Controlled Substance Prescriptions [place] Controlled Substance Registry consulted? No   Chase Picket, MD 10/26/18 Einar Crow

## 2018-10-26 NOTE — ED Triage Notes (Signed)
Fell yesterday morning, slipped on wet steps.  Foot slipped and landed on right knee.  Neck and back sore today.

## 2018-11-23 ENCOUNTER — Institutional Professional Consult (permissible substitution): Payer: Medicare HMO | Admitting: Internal Medicine

## 2018-12-05 DIAGNOSIS — M6281 Muscle weakness (generalized): Secondary | ICD-10-CM | POA: Insufficient documentation

## 2018-12-08 IMAGING — DX DG KNEE COMPLETE 4+V*R*
4 series · 4 of 4 positions shown · non-contrast
Comparison: 12/21/15

CLINICAL DATA: Recent fall several days ago with persistent right
knee pain, initial encounter

EXAM:
RIGHT KNEE - COMPLETE 4+ VIEW

[knee ap]
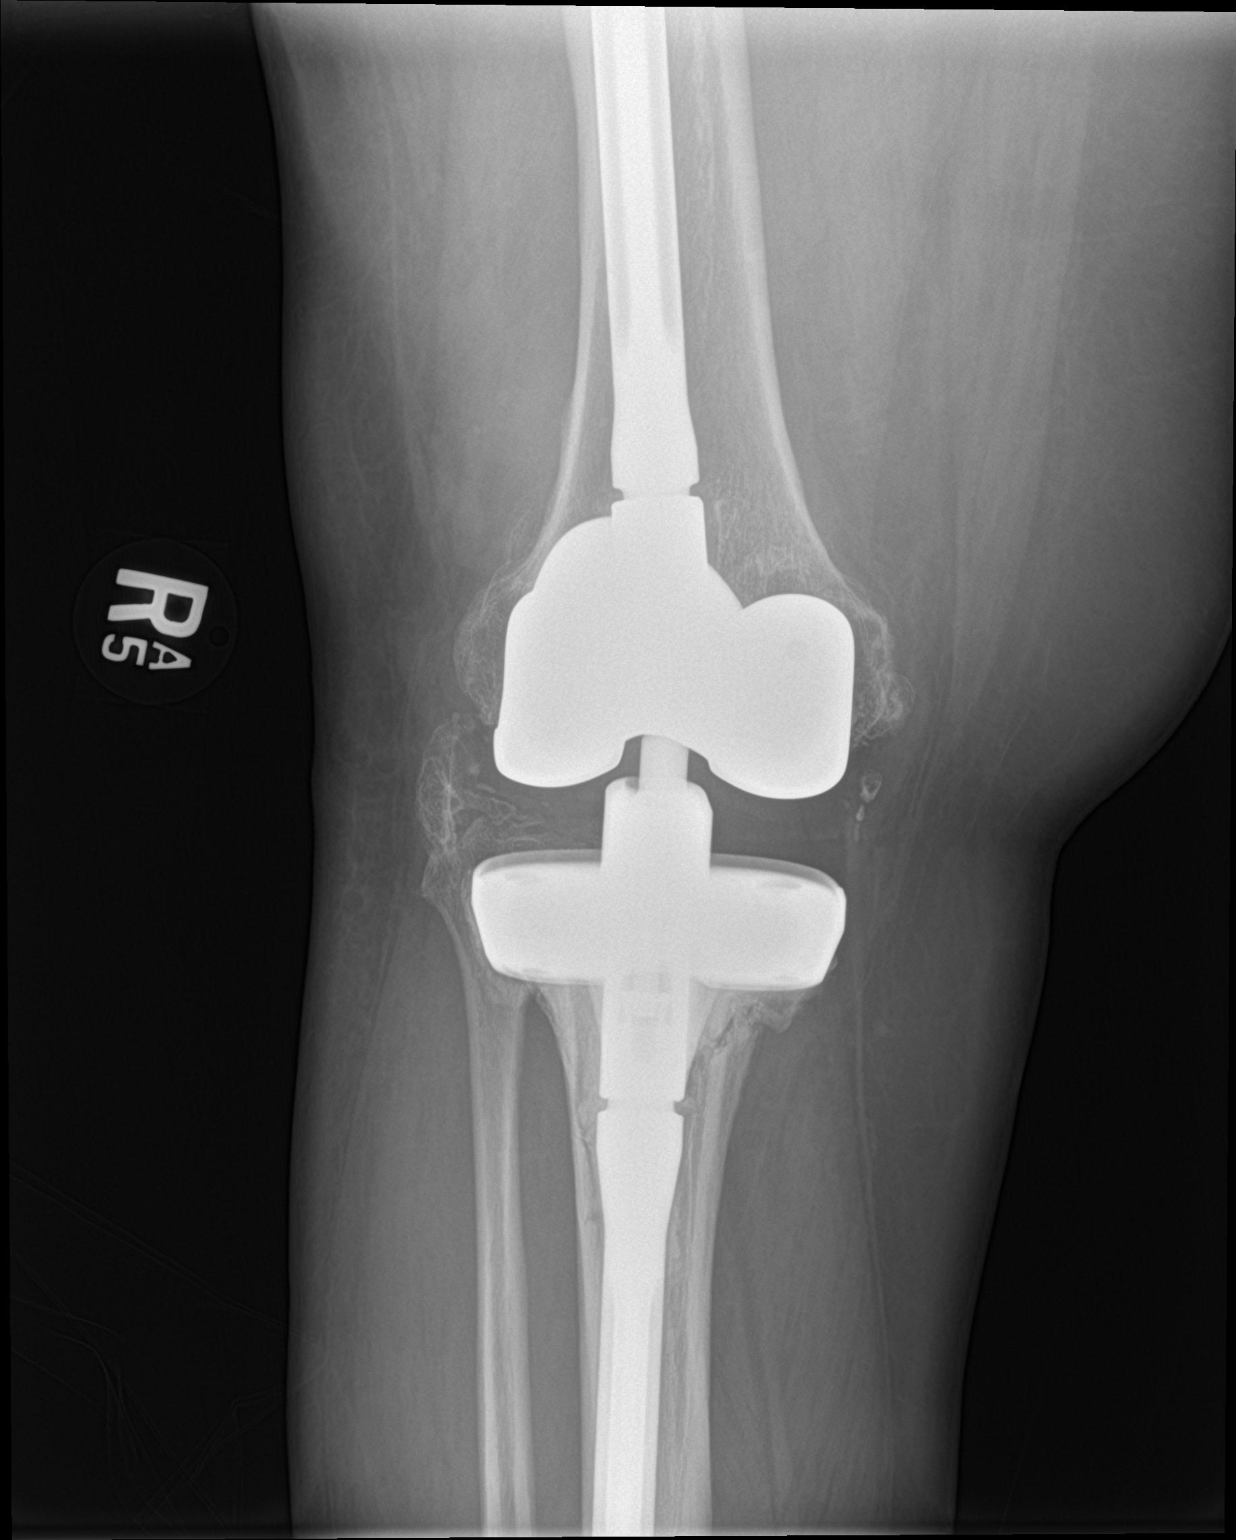

[knee obl (1 of 2)]
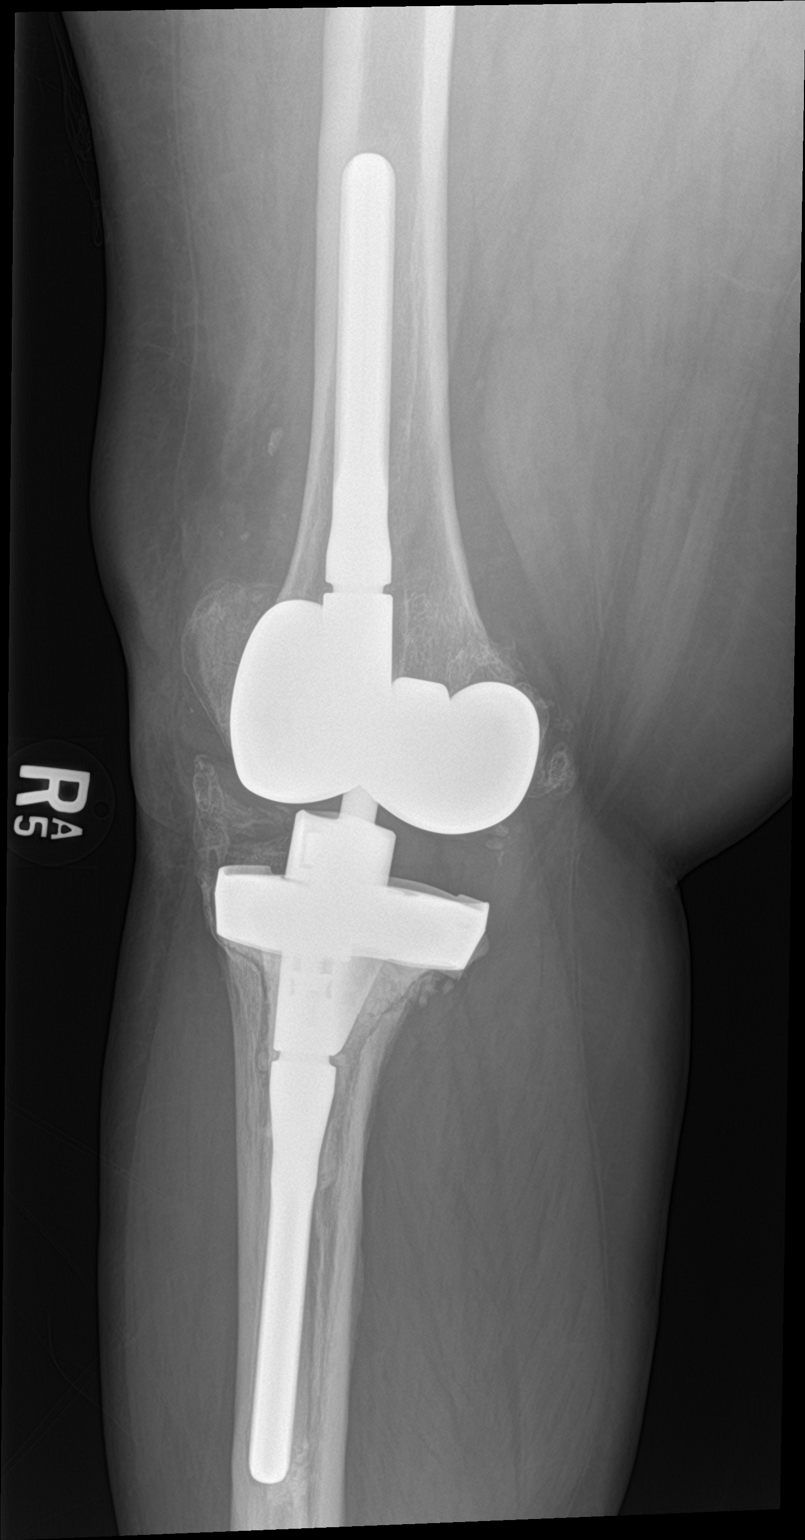

[knee obl (2 of 2)]
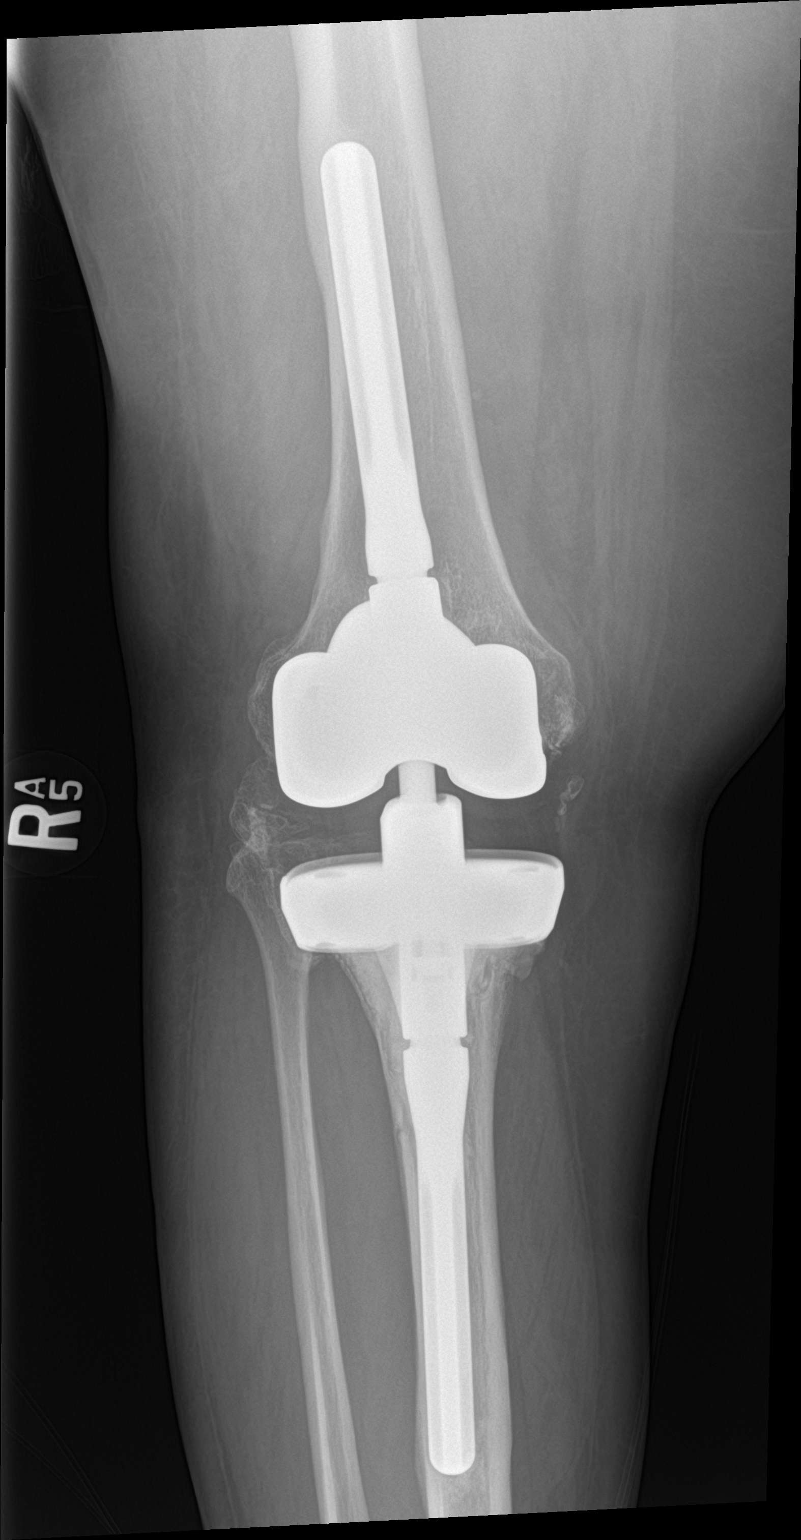

[knee lat]
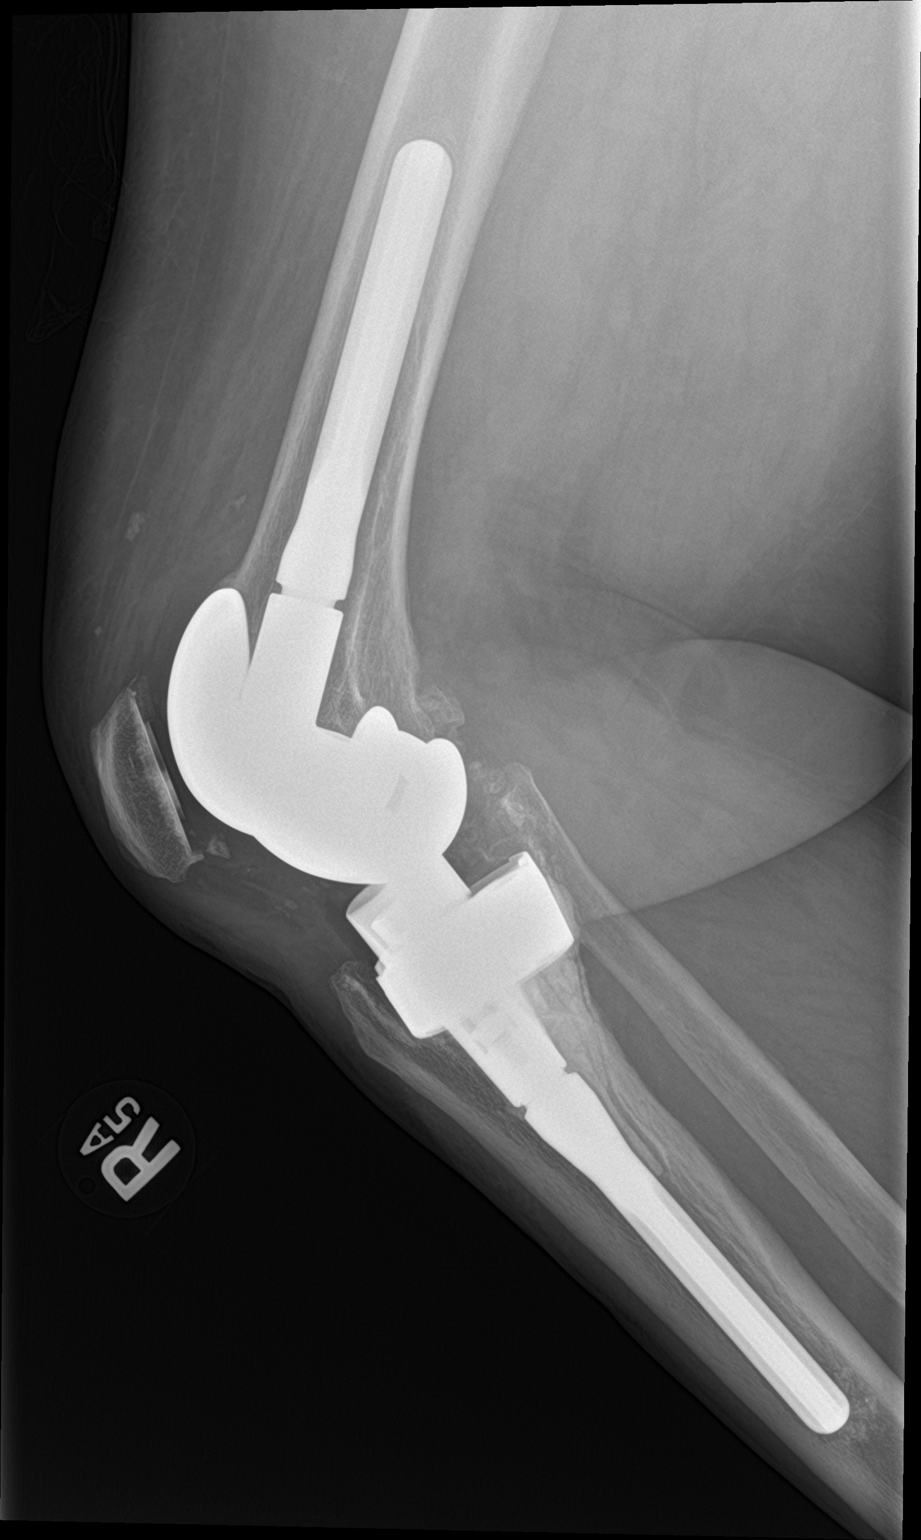

[4 of 4 positions shown; findings below may reference images not displayed]

FINDINGS: Right knee prosthesis is again identified. Some dystrophic bony
changes are noted about the knee joint stable from the prior exam.
No definitive acute abnormality is noted. Persistent lucency
surrounding the fixation cement is noted.
IMPRESSION: Stable appearance from 8981.  No acute abnormality noted.

## 2018-12-12 ENCOUNTER — Institutional Professional Consult (permissible substitution): Payer: Medicare HMO | Admitting: Internal Medicine

## 2018-12-14 ENCOUNTER — Ambulatory Visit (HOSPITAL_COMMUNITY)
Admission: EM | Admit: 2018-12-14 | Discharge: 2018-12-14 | Disposition: A | Discharge status: Home / Self Care | Payer: Medicare HMO | Attending: Family Medicine | Admitting: Family Medicine

## 2018-12-14 ENCOUNTER — Other Ambulatory Visit: Payer: Self-pay

## 2018-12-14 ENCOUNTER — Encounter (HOSPITAL_COMMUNITY): Payer: Self-pay | Admitting: Emergency Medicine

## 2018-12-14 DIAGNOSIS — Z96651 Presence of right artificial knee joint: Secondary | ICD-10-CM

## 2018-12-14 DIAGNOSIS — M25561 Pain in right knee: Secondary | ICD-10-CM

## 2018-12-14 MED ORDER — KETOROLAC TROMETHAMINE 60 MG/2ML IM SOLN
INTRAMUSCULAR | Status: AC
  Filled 2018-12-14: qty 2

## 2018-12-14 MED ORDER — METHYLPREDNISOLONE 4 MG PO TBPK: ORAL_TABLET | ORAL | 0 refills | Status: DC

## 2018-12-14 MED ORDER — KETOROLAC TROMETHAMINE 60 MG/2ML IM SOLN
60.0000 mg | Freq: Once | INTRAMUSCULAR | Status: AC
  Administered 2018-12-14: 60 mg via INTRAMUSCULAR

## 2018-12-14 NOTE — Discharge Instructions (Addendum)
Given you a prednisone pack.  Take as directed.  Take all of day 1 today I hope this helps with your pain

## 2018-12-14 NOTE — ED Triage Notes (Signed)
Fell on may 13.  Was seen at [facility] on may 14.  Patient was continuing to have pain.  Patient has seen ortho provider and is in physical therapy.    Pain is not controlled by current medicine

## 2018-12-14 NOTE — ED Provider Notes (Signed)
[place]    CSN: [id number] Arrival date & time: 12/14/18  0807      History   Chief Complaint Chief Complaint  Patient presents with  . Knee Pain    HPI Caroline Williams is a 58 y.o. female.   HPI  Patient is here for pain in her left knee.  She states that she had a knee replacement.  Her knee is been painful ever since the fall.  She saw her orthopedic provider.  X-rays have been done.  Physical therapy is ordered.  She is wearing a brace.  She states that she is already on chronic pain medication and is not requesting narcotics.  She states that when her knees hurt she sometimes benefits from a few days of steroids.  She also states that Toradol injections have given her a days worth of relief and she would like a injection today.  She cites an allergy to aspirin, but tolerates Toradol.  Past Medical History:  Diagnosis Date  . Anxiety   . Arthritis   . Asthma   . Bipolar 1 disorder ([place])   . Breast discharge   . Breast lump   . Breast pain   . Chronic pain    on MS Contin and oxycodone  . Depression   . Diabetes mellitus    diet and exercise controlled  . Escherichia coli (E. coli) infection   . Fever   . GERD (gastroesophageal reflux disease)   . History of chest pain   . History of kidney stones   . History of knee replacement, total   . Hypertension   . Hypothyroidism   . N&V (nausea and vomiting)   . Peripheral neuropathy    Right foot no sensation  . Pneumonia    2015,2014  . Sleep apnea    does not use CPAP  . Thyroid disease   . Wears glasses     Patient Active Problem List   Diagnosis Date Noted  . Change in bowel habits 03/14/2018  . Chest pain 01/26/2016  . Sepsis due to Escherichia coli ([place])   . E coli bacteremia   . E. coli UTI   . Hallucination, drug-induced ([place]) 03/12/2015  . Leukocytosis 03/12/2015  . Hypokalemia 03/12/2015  . Hypothyroidism 03/12/2015  . UTI (urinary tract infection) 03/12/2015  . Sepsis due to Gram  negative bacteria ([place])   . Gram-negative bacteremia 03/11/2015  . Lap gastric bypass June 2016 11/25/2014  . Diabetes mellitus with neuropathy ([place]) 06/20/2013  . Chronic pain of right knee 06/20/2013  . Morbid obesity ([place]) 07/05/2012    Past Surgical History:  Procedure Laterality Date  . ABDOMINAL HYSTERECTOMY     partial  . APPENDECTOMY    . BIOPSY  03/14/2018   Procedure: BIOPSY;  Surgeon: Wilford Corner, MD;  [facility];  Service: Endoscopy;;  . BREAST DUCTAL SYSTEM EXCISION Right 08/31/2017   Procedure: RIGHT BREAST CENTRAL DUCT EXCISION;  Surgeon: Erroll Luna, MD;  Location: [place];  Service: General;  Laterality: Right;  . BREAST EXCISIONAL BIOPSY Right   . BREAST LUMPECTOMY     right  . CARDIAC CATHETERIZATION     no significant CAD, nl LV function by 11/08/06 cath  . CESAREAN SECTION     x4  . COLONOSCOPY WITH PROPOFOL N/A 03/14/2018   Procedure: COLONOSCOPY WITH PROPOFOL;  Surgeon: Wilford Corner, MD;  [facility];  Service: Endoscopy;  Laterality: N/A;  . GASTRIC ROUX-EN-Y N/A 11/25/2014  Procedure: LAPAROSCOPIC ROUX-EN-Y GASTRIC BYPASS WITH UPPER ENDOSCOPY;  Surgeon: Johnathan Hausen, MD;  [facility];  Service: General;  Laterality: N/A;  . HERNIA REPAIR    . JOINT REPLACEMENT    . KNEE SURGERY     right  . MOUTH SURGERY    . [place]  . POLYPECTOMY  03/14/2018   Procedure: POLYPECTOMY;  Surgeon: Wilford Corner, MD;  [facility];  Service: Endoscopy;;  . TOTAL KNEE REVISION  06/21/2012   Procedure: TOTAL KNEE REVISION;  Surgeon: Newt Minion, MD;  Location: [facility];  Service: Orthopedics;  Laterality: Right;  Revision Right Total Knee Arthroplasty    OB History   No obstetric history on file.      Home Medications    Prior to Admission medications   Medication Sig Start Date End Date Taking? Authorizing Provider  amLODipine (NORVASC) 5 MG tablet Take 5 mg by mouth  daily. 06/15/16  Yes [provider]  amphetamine-dextroamphetamine (ADDERALL XR) 30 MG 24 hr capsule Take 30 mg by mouth daily.  03/10/18  Yes [provider]  amphetamine-dextroamphetamine (ADDERALL) 20 MG tablet Take 20 mg by mouth every evening.  10/13/17  Yes [provider]  Cyanocobalamin (VITAMIN B 12 PO) Take 1 tablet by mouth daily.    Yes [provider]  cyclobenzaprine (FLEXERIL) 10 MG tablet Take 1/2 to 1 whole tablet by mouth every 8 hours as needed for muscle pain/spasms. 07/26/18  Yes Amyot, Nicholes Stairs, NP  doxepin (SINEQUAN) 75 MG capsule Take 75 mg by mouth at bedtime. 03/02/18  Yes [provider]  levothyroxine (SYNTHROID, LEVOTHROID) 112 MCG tablet Take 112 mcg by mouth daily. 08/09/16  Yes [provider]  linaclotide (LINZESS) 290 MCG CAPS capsule Take 290 mcg by mouth at bedtime.   Yes [provider]  olmesartan-hydrochlorothiazide (BENICAR HCT) 20-12.5 MG tablet Take 1 tablet by mouth daily.    Yes [provider]  omeprazole (PRILOSEC) 40 MG capsule Take 40 mg by mouth daily.  08/14/17  Yes [provider]  Oxycodone HCl 10 MG TABS Take 1 tablet (10 mg total) by mouth every 6 (six) hours as needed (for pain). 11/14/14  Yes Danella Sensing L, NP  VOLTAREN 1 % GEL Apply 4 g topically 4 (four) times daily. 12/25/17  Yes Tereasa Coop, PA-C  albuterol (PROVENTIL HFA;VENTOLIN HFA) 108 (90 BASE) MCG/ACT inhaler Inhale 2 puffs into the lungs every 6 (six) hours as needed for wheezing or shortness of breath. Shortness of breath    [provider]  ALPRAZolam (XANAX) 1 MG tablet Take 1 mg by mouth 4 (four) times daily.  07/02/14   [provider]  EPINEPHrine 0.3 mg/0.3 mL IJ SOAJ injection Inject 0.3 mg as directed as needed (severe allergic reaction).  01/11/14   [provider]  LYRICA 75 MG capsule Take 75 mg by mouth 2 (two) times daily.  08/18/17   [provider]   methylPREDNISolone (MEDROL DOSEPAK) 4 MG TBPK tablet TAD 12/14/18   Raylene Everts, MD  metoCLOPramide (REGLAN) 10 MG tablet Take 1 tablet (10 mg total) by mouth every 8 (eight) hours as needed for nausea. 06/30/18   Mesner, Corene Cornea, MD  morphine (MS CONTIN) 15 MG 12 hr tablet Take 1 tablet (15 mg total) by mouth every 12 (twelve) hours. 11/14/14   Bayard Hugger, NP  Multiple Vitamin (MULTIVITAMIN) tablet Take 1 tablet by mouth every morning.     [provider]  nitroGLYCERIN (NITROSTAT) 0.4 MG SL tablet DISSOLVE 1 TABLET UNDER THE TONGUE EVERY 5 MINUTES AS NEEDED Patient taking differently: Place 0.4 mg under the tongue every 5 (five) minutes as needed for chest pain.  01/27/16   Belva Crome, MD  escitalopram (LEXAPRO) 20 MG tablet Take 20 mg by mouth daily.  12/14/18  [provider]  LATUDA 120 MG TABS Take 120 mg by mouth at bedtime.  03/04/18 12/14/18  [provider]  LISINOPRIL PO Take by mouth daily.    09/06/11  [provider]    Family History Family History  Problem Relation Age of Onset  . Cancer Father        colon  . Stroke Father   . Heart disease Father   . Diabetes Father   . Hypertension Father   . Depression Sister   . Hypertension Mother   . Breast cancer Neg Hx     Social History Social History   Tobacco Use  . Smoking status: Current Every Day Smoker    Packs/day: 0.50    Years: 43.00    Pack years: 21.50    Types: Cigarettes  . Smokeless tobacco: Never Used  Substance Use Topics  . Alcohol use: Yes    Comment: social  . Drug use: No     Allergies   Aspirin, Sulfa antibiotics, and Other   Review of Systems Review of Systems  Constitutional: Negative for chills and fever.  HENT: Negative for ear pain and sore throat.   Eyes: Negative for pain and visual disturbance.  Respiratory: Negative for cough and shortness of breath.   Cardiovascular: Negative for chest pain and palpitations.  Gastrointestinal: Negative  for abdominal pain and vomiting.  Genitourinary: Negative for dysuria and hematuria.  Musculoskeletal: Positive for arthralgias, back pain and gait problem.  Skin: Negative for color change and rash.  Neurological: Negative for seizures and syncope.  All other systems reviewed and are negative.    Physical Exam Triage Vital Signs ED Triage Vitals  Enc Vitals Group     BP 12/14/18 0829 107/64     Pulse Rate 12/14/18 0829 79     Resp 12/14/18 0829 16     Temp 12/14/18 0829 98.1 F (36.7 C)     Temp Source 12/14/18 0829 Oral     SpO2 12/14/18 0829 97 %     Weight --      Height --      Head Circumference --      Peak Flow --      Pain Score 12/14/18 0824 8     Pain Loc --      Pain Edu? --      Excl. in [place]? --    No data found.  Updated Vital Signs BP 107/64 (BP Location: Left Arm)   Pulse 79   Temp 98.1 F (36.7 C) (Oral)   Resp 16   SpO2 97%      Physical Exam Constitutional:      General: She is not in acute distress.    Appearance: She is well-developed.     Comments: Overweight.  Appears uncomfortable.  Right wrist and right leg are in brace.  HENT:     Head: Normocephalic and atraumatic.  Eyes:     Conjunctiva/sclera: Conjunctivae normal.     Pupils: Pupils are equal, round, and reactive to light.  Neck:     Musculoskeletal: Normal range of motion.  Cardiovascular:  Rate and Rhythm: Normal rate and regular rhythm.     Heart sounds: Normal heart sounds.  Pulmonary:     Effort: Pulmonary effort is normal. No respiratory distress.     Breath sounds: Normal breath sounds.  Abdominal:     General: There is no distension.     Palpations: Abdomen is soft.  Musculoskeletal: Normal range of motion.     Comments: Right knee has effusion.  Mild bilateral joint line tenderness.  Well-healed scar.  Lacks full extension and flexion.  No instability  Skin:    General: Skin is warm and dry.  Neurological:     Mental Status: She is alert.      UC Treatments  / Results  Labs (all labs ordered are listed, but only abnormal results are displayed) Labs Reviewed - No data to display  EKG   Radiology No results found.  Procedures Procedures (including critical care time)  Medications Ordered in UC Medications  ketorolac (TORADOL) injection 60 mg (60 mg Intramuscular Given 12/14/18 0916)  ketorolac (TORADOL) 60 MG/2ML injection (has no administration in time range)    Initial Impression / Assessment and Plan / UC Course  I have reviewed the triage vital signs and the nursing notes.  Pertinent labs & imaging results that were available during my care of the patient were reviewed by me and considered in my medical decision making (see chart for details).     Chronic pain.  Not currently managed.  We will give her the injection and steroids as she requests.  These have helped her in the past. Final Clinical Impressions(s) / UC Diagnoses   Final diagnoses:  Acute pain of right knee     Discharge Instructions     Given you a prednisone pack.  Take as directed.  Take all of day 1 today I hope this helps with your pain   ED Prescriptions    Medication Sig Dispense Auth. Provider   methylPREDNISolone (MEDROL DOSEPAK) 4 MG TBPK tablet TAD 21 tablet Raylene Everts, MD     Controlled Substance Prescriptions [place] Controlled Substance Registry consulted? Yes.   Raylene Everts, MD 12/14/18 [phone]

## 2018-12-20 ENCOUNTER — Encounter: Payer: Self-pay | Admitting: Internal Medicine

## 2018-12-20 ENCOUNTER — Other Ambulatory Visit: Payer: Self-pay

## 2018-12-20 ENCOUNTER — Ambulatory Visit (INDEPENDENT_AMBULATORY_CARE_PROVIDER_SITE_OTHER): Payer: Medicare HMO | Admitting: Internal Medicine

## 2018-12-20 VITALS — BP 122/58 | HR 79 | Temp 98.3°F | Ht 65.0 in | Wt 230.8 lb

## 2018-12-20 DIAGNOSIS — G4733 Obstructive sleep apnea (adult) (pediatric): Secondary | ICD-10-CM | POA: Diagnosis not present

## 2018-12-20 NOTE — Patient Instructions (Addendum)
Order- schedule unattended home sleep test, or [facility] N[place] Sleep Study at sleep center if required by insurance.     Please call us about 2 weeks after your sleep study to see if results and recommendations are ready. If appropriate, we may be able to start treatment before we see you next.      Be very careful not to drive unless you are awake and alert.

## 2018-12-20 NOTE — Progress Notes (Signed)
40 yoF current smoker for sleep evaluation. Medical problem list includes Hypothyroid, DM2 w/ neuropathy, Morbid Obesity/  Lap Gastric Bypass, Asthma,  Med list includes Adderall( 30 AM, 20 PM), Flexeril, Doxepin( 75 hs),  -----referred by PCP, pt reports sleep study approx. 8 years ago, been on CPAP in the past ~10 years, however, stopped using the machine 4 years ago when machine broke. [place] Kitchen Epworth score 14 Body weight today 230 lbs Aware now of loud snore and wakes gasping. Daytime fatigue. Significant weight loss after bariatric surgery.  Smoking 1/2 ppd. Uses rescue inhaler most days.  Prior to Admission medications   Medication Sig Start Date End Date Taking? Authorizing Provider  albuterol (PROVENTIL HFA;VENTOLIN HFA) 108 (90 BASE) MCG/ACT inhaler Inhale 2 puffs into the lungs every 6 (six) hours as needed for wheezing or shortness of breath. Shortness of breath   Yes [provider]  ALPRAZolam (XANAX) 1 MG tablet Take 1 mg by mouth 4 (four) times daily.  07/02/14  Yes [provider]  amLODipine (NORVASC) 5 MG tablet Take 5 mg by mouth daily. 06/15/16  Yes [provider]  amphetamine-dextroamphetamine (ADDERALL XR) 30 MG 24 hr capsule Take 30 mg by mouth daily.  03/10/18  Yes [provider]  amphetamine-dextroamphetamine (ADDERALL) 20 MG tablet Take 20 mg by mouth every evening.  10/13/17  Yes [provider]  cariprazine (VRAYLAR) capsule 3 mg daily.   Yes [provider]  Cyanocobalamin (VITAMIN B 12 PO) Take 1 tablet by mouth daily.    Yes [provider]  cyclobenzaprine (FLEXERIL) 10 MG tablet Take 1/2 to 1 whole tablet by mouth every 8 hours as needed for muscle pain/spasms. 07/26/18  Yes Amyot, Nicholes Stairs, NP  doxepin (SINEQUAN) 75 MG capsule Take 75 mg by mouth at bedtime. 03/02/18  Yes [provider]  levothyroxine (SYNTHROID, LEVOTHROID) 112 MCG tablet Take 112 mcg by mouth daily. 08/09/16  Yes [provider]  linaclotide (LINZESS) 290 MCG CAPS capsule Take 290 mcg by mouth at bedtime.   Yes [provider]  methylPREDNISolone (MEDROL DOSEPAK) 4 MG TBPK tablet TAD 12/14/18  Yes Raylene Everts, MD  morphine (MS CONTIN) 15 MG 12 hr tablet Take 1 tablet (15 mg total) by mouth every 12 (twelve) hours. 11/14/14  Yes Bayard Hugger, NP  Multiple Vitamin (MULTIVITAMIN) tablet Take 1 tablet by mouth every morning.    Yes [provider]  olmesartan-hydrochlorothiazide (BENICAR HCT) 20-12.5 MG tablet Take 1 tablet by mouth daily.    Yes [provider]  omeprazole (PRILOSEC) 40 MG capsule Take 40 mg by mouth daily.  08/14/17  Yes [provider]  Oxycodone HCl 10 MG TABS Take 1 tablet (10 mg total) by mouth every 6 (six) hours as needed (for pain). 11/14/14  Yes Danella Sensing L, NP  VOLTAREN 1 % GEL Apply 4 g topically 4 (four) times daily. 12/25/17  Yes Tereasa Coop, PA-C  vortioxetine HBr (TRINTELLIX) 10 MG TABS tablet 10 mg daily.   Yes [provider]  EPINEPHrine 0.3 mg/0.3 mL IJ SOAJ injection Inject 0.3 mg as directed as needed (severe allergic reaction).  01/11/14   [provider]  LYRICA 75 MG capsule Take 75 mg by mouth 2 (two) times daily.  08/18/17   [provider]  metoCLOPramide (REGLAN) 10 MG tablet Take 1 tablet (10 mg total) by mouth every 8 (eight) hours as needed for nausea. Patient not taking: Reported on 12/20/2018 06/30/18  Mesner, Corene Cornea, MD  nitroGLYCERIN (NITROSTAT) 0.4 MG SL tablet DISSOLVE 1 TABLET UNDER THE TONGUE EVERY 5 MINUTES AS NEEDED Patient not taking: No sig reported 01/27/16   Belva Crome, MD  escitalopram (LEXAPRO) 20 MG tablet Take 20 mg by mouth daily.  12/14/18  [provider]  LATUDA 120 MG TABS Take 120 mg by mouth at bedtime.  03/04/18 12/14/18  [provider]  LISINOPRIL PO Take by mouth daily.    09/06/11  [provider]   Past Medical History:  Diagnosis Date  . Anxiety    . Arthritis   . Asthma   . Bipolar 1 disorder ([place])   . Breast discharge   . Breast lump   . Breast pain   . Chronic pain    on MS Contin and oxycodone  . Depression   . Diabetes mellitus    diet and exercise controlled  . Escherichia coli (E. coli) infection   . Fever   . GERD (gastroesophageal reflux disease)   . History of chest pain   . History of kidney stones   . History of knee replacement, total   . Hypertension   . Hypothyroidism   . N&V (nausea and vomiting)   . Peripheral neuropathy    Right foot no sensation  . Pneumonia    2015,2014  . Sleep apnea    does not use CPAP  . Thyroid disease   . Wears glasses    Past Surgical History:  Procedure Laterality Date  . ABDOMINAL HYSTERECTOMY     partial  . APPENDECTOMY    . BIOPSY  03/14/2018   Procedure: BIOPSY;  Surgeon: Wilford Corner, MD;  [facility];  Service: Endoscopy;;  . BREAST DUCTAL SYSTEM EXCISION Right 08/31/2017   Procedure: RIGHT BREAST CENTRAL DUCT EXCISION;  Surgeon: Erroll Luna, MD;  Location: [facility];  Service: General;  Laterality: Right;  . BREAST EXCISIONAL BIOPSY Right   . BREAST LUMPECTOMY     right  . CARDIAC CATHETERIZATION     no significant CAD, nl LV function by 11/08/06 cath  . CESAREAN SECTION     x4  . COLONOSCOPY WITH PROPOFOL N/A 03/14/2018   Procedure: COLONOSCOPY WITH PROPOFOL;  Surgeon: Wilford Corner, MD;  [facility];  Service: Endoscopy;  Laterality: N/A;  . GASTRIC ROUX-EN-Y N/A 11/25/2014   Procedure: LAPAROSCOPIC ROUX-EN-Y GASTRIC BYPASS WITH UPPER ENDOSCOPY;  Surgeon: Johnathan Hausen, MD;  [facility];  Service: General;  Laterality: N/A;  . HERNIA REPAIR    . JOINT REPLACEMENT    . KNEE SURGERY     right  . MOUTH SURGERY    . [place]  . POLYPECTOMY  03/14/2018   Procedure: POLYPECTOMY;  Surgeon: Wilford Corner, MD;  [facility];  Service: Endoscopy;;  . TOTAL KNEE  REVISION  06/21/2012   Procedure: TOTAL KNEE REVISION;  Surgeon: Newt Minion, MD;  Location: [facility];  Service: Orthopedics;  Laterality: Right;  Revision Right Total Knee Arthroplasty   Family History  Problem Relation Age of Onset  . Cancer Father        colon  . Stroke Father   . Heart disease Father   . Diabetes Father   . Hypertension Father   . Depression Sister   . Hypertension Mother   . Breast cancer Neg Hx    Social History   Socioeconomic History  . Marital status: Single    Spouse name: Not on  file  . Number of children: Not on file  . Years of education: Not on file  . Highest education level: Not on file  Occupational History  . Not on file  Social Needs  . Financial resource strain: Not on file  . Food insecurity    Worry: Not on file    Inability: Not on file  . Transportation needs    Medical: Not on file    Non-medical: Not on file  Tobacco Use  . Smoking status: Current Every Day Smoker    Packs/day: 0.50    Years: 43.00    Pack years: 21.50    Types: Cigarettes  . Smokeless tobacco: Never Used  Substance and Sexual Activity  . Alcohol use: Yes    Comment: social  . Drug use: No  . Sexual activity: Not on file  Lifestyle  . Physical activity    Days per week: Not on file    Minutes per session: Not on file  . Stress: Not on file  Relationships  . Social Herbalist on phone: Not on file    Gets together: Not on file    Attends religious service: Not on file    Active member of club or organization: Not on file    Attends meetings of clubs or organizations: Not on file    Relationship status: Not on file  . Intimate partner violence    Fear of current or ex partner: Not on file    Emotionally abused: Not on file    Physically abused: Not on file    Forced sexual activity: Not on file  Other Topics Concern  . Not on file  Social History Narrative  . Not on file   ROS-see HPI   + = positive Constitutional:   +weight loss,  night sweats, fevers, chills, fatigue, lassitude. HEENT:    +headaches, difficulty swallowing, tooth/dental problems, sore throat,       sneezing, itching, ear ache, nasal congestion, post nasal drip, snoring CV:    chest pain, orthopnea, PND, swelling in lower extremities, anasarca,                                  dizziness, palpitations Resp:   shortness of breath with exertion or at rest.                productive cough,   non-productive cough, coughing up of blood.              change in color of mucus.  wheezing.   Skin:    rash or lesions. GI:  No-   heartburn, indigestion, abdominal pain, nausea, vomiting, diarrhea,                 change in bowel habits, loss of appetite GU: dysuria, change in color of urine, no urgency or frequency.   flank pain. MS:   joint pain, stiffness, decreased range of motion, back pain. Neuro-     nothing unusual Psych:  change in mood or affect.  depression or +anxiety.   memory loss.  OBJ- Physical Exam General- Alert, Oriented, Affect-appropriate, Distress- none acute, + obese Skin- rash-none, lesions- none, excoriation- none Lymphadenopathy- none Head- atraumatic            Eyes- Gross vision intact, PERRLA, conjunctivae and secretions clear            Ears- Hearing,  canals-normal            Nose- Clear, no-Septal dev, mucus, polyps, erosion, perforation             Throat- Mallampati IV , mucosa clear , drainage- none, tonsils- atrophic Neck- flexible , trachea midline, no stridor , thyroid nl, carotid no bruit Chest - symmetrical excursion , unlabored           Heart/CV- RRR , no murmur , no gallop  , no rub, nl s1 s2                           - JVD- none , edema- none, stasis changes- none, varices- none           Lung- clear to P&A, wheeze- none, cough- none , dullness-none, rub- none           Chest wall-  Abd-  Br/ Gen/ Rectal- Not done, not indicated Extrem- cyanosis- none, clubbing, none, atrophy- none, strength- nl Neuro- grossly  intact to observation

## 2018-12-25 DIAGNOSIS — G4734 Idiopathic sleep related nonobstructive alveolar hypoventilation: Secondary | ICD-10-CM | POA: Insufficient documentation

## 2018-12-25 NOTE — Assessment & Plan Note (Signed)
She lost weight with bariatric surgery but is still too heavy and is encouraged not to give up on diet and exercise.

## 2018-12-25 NOTE — Assessment & Plan Note (Signed)
She recognizes current symptoms from previously dx'd OSA, and says they were resolved when using CPAP. Appropriate discussion done, questions answered. Cautioned against driving unless alert. Plan- Sleep study, anticipating CPAP

## 2019-01-03 ENCOUNTER — Ambulatory Visit: Payer: Medicare HMO

## 2019-01-03 ENCOUNTER — Other Ambulatory Visit: Payer: Self-pay

## 2019-01-03 DIAGNOSIS — G4733 Obstructive sleep apnea (adult) (pediatric): Secondary | ICD-10-CM

## 2019-01-05 DIAGNOSIS — G4733 Obstructive sleep apnea (adult) (pediatric): Secondary | ICD-10-CM | POA: Diagnosis not present

## 2019-01-29 ENCOUNTER — Other Ambulatory Visit: Payer: Self-pay

## 2019-01-29 ENCOUNTER — Ambulatory Visit (HOSPITAL_COMMUNITY)
Admission: EM | Admit: 2019-01-29 | Discharge: 2019-01-29 | Disposition: A | Discharge status: Home / Self Care | Payer: Medicare HMO | Attending: Family Medicine | Admitting: Family Medicine

## 2019-01-29 ENCOUNTER — Encounter (HOSPITAL_COMMUNITY): Payer: Self-pay | Admitting: Emergency Medicine

## 2019-01-29 DIAGNOSIS — G8929 Other chronic pain: Secondary | ICD-10-CM | POA: Diagnosis not present

## 2019-01-29 DIAGNOSIS — I10 Essential (primary) hypertension: Secondary | ICD-10-CM | POA: Diagnosis not present

## 2019-01-29 DIAGNOSIS — M25561 Pain in right knee: Secondary | ICD-10-CM | POA: Diagnosis not present

## 2019-01-29 MED ORDER — LIDOCAINE-PRILOCAINE 2.5-2.5 % EX CREA: 1.0000 "application " | TOPICAL_CREAM | CUTANEOUS | 0 refills | Status: DC | PRN

## 2019-01-29 MED ORDER — PREDNISONE 10 MG (21) PO TBPK: ORAL_TABLET | Freq: Every day | ORAL | 0 refills | Status: DC

## 2019-01-29 MED ORDER — KETOROLAC TROMETHAMINE 30 MG/ML IJ SOLN
INTRAMUSCULAR | Status: AC
  Filled 2019-01-29: qty 1

## 2019-01-29 MED ORDER — KETOROLAC TROMETHAMINE 30 MG/ML IJ SOLN
30.0000 mg | Freq: Once | INTRAMUSCULAR | Status: AC
  Administered 2019-01-29: 30 mg via INTRAMUSCULAR

## 2019-01-29 NOTE — ED Provider Notes (Signed)
[place]   [id number] 01/29/19 Arrival Time: 7026  ASSESSMENT & PLAN:  1. Chronic pain of right knee   2. Essential hypertension     Acute on chronic knee pain. No indication for imaging of knee today. Discussed.  Meds ordered this encounter  Medications  . predniSONE (STERAPRED UNI-PAK 21 TAB) 10 MG (21) TBPK tablet    Sig: Take by mouth daily. Take as directed.    Dispense:  21 tablet    Refill:  0  . lidocaine-prilocaine (EMLA) cream (She requests a trial of this; questions using before with good results.)    Sig: Apply 1 application topically as needed.    Dispense:  30 g    Refill:  0  . ketorolac (TORADOL) 30 MG/ML injection 30 mg    Follow-up Information    Kendall Flack, MD.   Specialty: Family Medicine Why: Call to discuss elevated blood pressure. Contact information: [place] [id number] [phone]          Understands to monitor blood glucose closely while on prednisone. May f/u here as needed.  Reviewed expectations re: course of current medical issues. Questions answered. Outlined signs and symptoms indicating need for more acute intervention. Patient verbalized understanding. After Visit Summary given.  SUBJECTIVE: History from: patient. Caroline Williams is a 58 y.o. female who reports acute on chronic non-radiating right knee pain for the past several days; 'always hurts, just hurting more over the past few days'. No trauma. Pain is persistent and dull in nature. No specific aggravating or alleviating factors reported except for increased pain with prolonged standing. Has seen specialists for this in the past. Takes oxycodone daily as needed. Associated symptoms: none reported. Extremity sensation changes or weakness: none. Self treatment: tried OTCs In addition to pain medications mild and temporaray help. Able to bear weight here. Reports IM Toradol has helped in the past.  Past Surgical History:   Procedure Laterality Date  . ABDOMINAL HYSTERECTOMY     partial  . APPENDECTOMY    . BIOPSY  03/14/2018   Procedure: BIOPSY;  Surgeon: Wilford Corner, MD;  [facility];  Service: Endoscopy;;  . BREAST DUCTAL SYSTEM EXCISION Right 08/31/2017   Procedure: RIGHT BREAST CENTRAL DUCT EXCISION;  Surgeon: Erroll Luna, MD;  Location: [place];  Service: General;  Laterality: Right;  . BREAST EXCISIONAL BIOPSY Right   . BREAST LUMPECTOMY     right  . CARDIAC CATHETERIZATION     no significant CAD, nl LV function by 11/08/06 cath  . CESAREAN SECTION     x4  . COLONOSCOPY WITH PROPOFOL N/A 03/14/2018   Procedure: COLONOSCOPY WITH PROPOFOL;  Surgeon: Wilford Corner, MD;  [facility];  Service: Endoscopy;  Laterality: N/A;  . GASTRIC ROUX-EN-Y N/A 11/25/2014   Procedure: LAPAROSCOPIC ROUX-EN-Y GASTRIC BYPASS WITH UPPER ENDOSCOPY;  Surgeon: Johnathan Hausen, MD;  [facility];  Service: General;  Laterality: N/A;  . HERNIA REPAIR    . JOINT REPLACEMENT    . KNEE SURGERY     right  . MOUTH SURGERY    . [place]  . POLYPECTOMY  03/14/2018   Procedure: POLYPECTOMY;  Surgeon: Wilford Corner, MD;  [facility];  Service: Endoscopy;;  . TOTAL KNEE REVISION  06/21/2012   Procedure: TOTAL KNEE REVISION;  Surgeon: Newt Minion, MD;  Location: [place];  Service: Orthopedics;  Laterality: Right;  Revision Right Total Knee Arthroplasty  Increased blood pressure noted today. Unsure if she has taken BP medications today.  She reports no chest pain on exertion, no dyspnea on exertion, no LE edema, no orthostatic dizziness or lightheadedness, no orthopnea or paroxysmal nocturnal dyspnea, no palpitations and no intermittent claudication symptoms.  ROS: As per HPI. All other systems negative.    OBJECTIVE:  Vitals:   01/29/19 1131  BP: (!) 182/97  Pulse: 68  Resp: 17  Temp: 98.4 F (36.9 C)  TempSrc: Oral  SpO2: 100%     General appearance: alert; no distress HEENT: [place]; AT Neck: supple with FROM CV: regular Resp: unlabored respirations Extremities: . RLE: warm and well perfused; poorly localized moderate discomfort with exam of right knee; no specific bone tenderness; without gross deformities; with mild swelling compared to her left knee; with no bruising; ROM: normal with reported generalized discomfort CV: brisk extremity capillary refill of RLE; 2+ DP pulse of RLE. Skin: warm and dry; no visible rashes Neurologic: gait normal but slow and favors RLE; normal reflexes of RLE; normal sensation of RLE; normal strength of RLE Psychological: alert and cooperative; normal mood and affect    Allergies  Allergen Reactions  . Aspirin Anaphylaxis and Other (See Comments)    Pt states she has had Toradol several times without any reactions  . Bee Venom Anaphylaxis  . Sulfa Antibiotics Anaphylaxis  . Other Other (See Comments)    No blood products.  Patient did  Request that only albumin or albumin-containing products may be administered    Past Medical History:  Diagnosis Date  . Anxiety   . Arthritis   . Asthma   . Bipolar 1 disorder ([place])   . Breast discharge   . Breast lump   . Breast pain   . Chronic pain    on MS Contin and oxycodone  . Depression   . Diabetes mellitus    diet and exercise controlled  . Escherichia coli (E. coli) infection   . Fever   . GERD (gastroesophageal reflux disease)   . History of chest pain   . History of kidney stones   . History of knee replacement, total   . Hypertension   . Hypothyroidism   . N&V (nausea and vomiting)   . Peripheral neuropathy    Right foot no sensation  . Pneumonia    2015,2014  . Sleep apnea    does not use CPAP  . Thyroid disease   . Wears glasses    Social History   Socioeconomic History  . Marital status: Single    Spouse name: Not on file  . Number of children: Not on file  . Years of education: Not on file  .  Highest education level: Not on file  Occupational History  . Not on file  Social Needs  . Financial resource strain: Not on file  . Food insecurity    Worry: Not on file    Inability: Not on file  . Transportation needs    Medical: Not on file    Non-medical: Not on file  Tobacco Use  . Smoking status: Current Every Day Smoker    Packs/day: 0.50    Years: 43.00    Pack years: 21.50    Types: Cigarettes  . Smokeless tobacco: Never Used  Substance and Sexual Activity  . Alcohol use: Yes    Comment: social  . Drug use: No  . Sexual activity: Not on file  Lifestyle  . Physical activity  Days per week: Not on file    Minutes per session: Not on file  . Stress: Not on file  Relationships  . Social Herbalist on phone: Not on file    Gets together: Not on file    Attends religious service: Not on file    Active member of club or organization: Not on file    Attends meetings of clubs or organizations: Not on file    Relationship status: Not on file  Other Topics Concern  . Not on file  Social History Narrative  . Not on file   Family History  Problem Relation Age of Onset  . Cancer Father        colon  . Stroke Father   . Heart disease Father   . Diabetes Father   . Hypertension Father   . Depression Sister   . Hypertension Mother   . Breast cancer Neg Hx    Past Surgical History:  Procedure Laterality Date  . ABDOMINAL HYSTERECTOMY     partial  . APPENDECTOMY    . BIOPSY  03/14/2018   Procedure: BIOPSY;  Surgeon: Wilford Corner, MD;  [facility];  Service: Endoscopy;;  . BREAST DUCTAL SYSTEM EXCISION Right 08/31/2017   Procedure: RIGHT BREAST CENTRAL DUCT EXCISION;  Surgeon: Erroll Luna, MD;  Location: [place];  Service: General;  Laterality: Right;  . BREAST EXCISIONAL BIOPSY Right   . BREAST LUMPECTOMY     right  . CARDIAC CATHETERIZATION     no significant CAD, nl LV function by 11/08/06 cath  . CESAREAN  SECTION     x4  . COLONOSCOPY WITH PROPOFOL N/A 03/14/2018   Procedure: COLONOSCOPY WITH PROPOFOL;  Surgeon: Wilford Corner, MD;  [facility];  Service: Endoscopy;  Laterality: N/A;  . GASTRIC ROUX-EN-Y N/A 11/25/2014   Procedure: LAPAROSCOPIC ROUX-EN-Y GASTRIC BYPASS WITH UPPER ENDOSCOPY;  Surgeon: Johnathan Hausen, MD;  [facility];  Service: General;  Laterality: N/A;  . HERNIA REPAIR    . JOINT REPLACEMENT    . KNEE SURGERY     right  . MOUTH SURGERY    . [place]  . POLYPECTOMY  03/14/2018   Procedure: POLYPECTOMY;  Surgeon: Wilford Corner, MD;  [facility];  Service: Endoscopy;;  . TOTAL KNEE REVISION  06/21/2012   Procedure: TOTAL KNEE REVISION;  Surgeon: Newt Minion, MD;  Location: [place];  Service: Orthopedics;  Laterality: Right;  Revision Right Total Knee Arthroplasty      Vanessa Kick, MD 01/31/19 [phone]

## 2019-01-29 NOTE — Discharge Instructions (Signed)
Meds ordered this encounter  Medications   predniSONE (STERAPRED UNI-PAK 21 TAB) 10 MG (21) TBPK tablet    Sig: Take by mouth daily. Take as directed.    Dispense:  21 tablet    Refill:  0   lidocaine-prilocaine (EMLA) cream    Sig: Apply 1 application topically as needed.    Dispense:  30 g    Refill:  0   ketorolac (TORADOL) 30 MG/ML injection 30 mg

## 2019-01-29 NOTE — ED Triage Notes (Signed)
Pt presents to [facility] for assessment of right knee pain since a fall in May.  Patient states she normally comes in, gets a prednisone pack and a Toradol shot, and a refill on her morphine.

## 2019-01-30 ENCOUNTER — Other Ambulatory Visit: Payer: Self-pay

## 2019-01-30 DIAGNOSIS — Z20822 Contact with and (suspected) exposure to covid-19: Secondary | ICD-10-CM

## 2019-01-31 ENCOUNTER — Telehealth: Payer: Self-pay

## 2019-01-31 LAB — NOVEL CORONAVIRUS, NAA: SARS-CoV-2, NAA: NOT DETECTED

## 2019-01-31 NOTE — Telephone Encounter (Signed)
Patient returned call requesting COVID19 lab results - DOB veriied - advised of results, no further questions.

## 2019-02-10 ENCOUNTER — Encounter (HOSPITAL_COMMUNITY): Payer: Self-pay

## 2019-02-10 ENCOUNTER — Ambulatory Visit (HOSPITAL_COMMUNITY)
Admission: EM | Admit: 2019-02-10 | Discharge: 2019-02-10 | Disposition: A | Discharge status: Home / Self Care | Payer: Medicare HMO | Attending: Emergency Medicine | Admitting: Emergency Medicine

## 2019-02-10 ENCOUNTER — Other Ambulatory Visit: Payer: Self-pay

## 2019-02-10 DIAGNOSIS — G8929 Other chronic pain: Secondary | ICD-10-CM

## 2019-02-10 DIAGNOSIS — M545 Low back pain, unspecified: Secondary | ICD-10-CM

## 2019-02-10 DIAGNOSIS — M25561 Pain in right knee: Secondary | ICD-10-CM | POA: Diagnosis not present

## 2019-02-10 MED ORDER — KETOROLAC TROMETHAMINE 30 MG/ML IJ SOLN
INTRAMUSCULAR | Status: AC
  Filled 2019-02-10: qty 1

## 2019-02-10 MED ORDER — KETOROLAC TROMETHAMINE 30 MG/ML IJ SOLN
30.0000 mg | Freq: Once | INTRAMUSCULAR | Status: AC
  Administered 2019-02-10: 30 mg via INTRAMUSCULAR

## 2019-02-10 NOTE — ED Triage Notes (Signed)
Patient presents to Urgent Care with complaints of right knee pain and lower back pain that is chronic but started getting worse  yesterday. Patient reports she is out of her pain medicine, would like a toradol shot here today.

## 2019-02-10 NOTE — ED Provider Notes (Signed)
[facility]    CSN: [id number] Arrival date & time: 02/10/19  1001      History   Chief Complaint Chief Complaint  Patient presents with  . Knee Pain  . Back Pain    HPI Caroline Williams is a 58 y.o. female.   Patient presents with chronic low back pain and chronic right knee pain.  She is followed by a pain clinic.  She states her pain is worse today.  No falls or injury.  She denies associated symptoms, including weakness, paresthesias, numbness, or loss of bowel/bladder control.  She denies aggravating or alleviating factors.  She requests an injection of Toradol.  The history is provided by the patient.    Past Medical History:  Diagnosis Date  . Anxiety   . Arthritis   . Asthma   . Bipolar 1 disorder ([place])   . Breast discharge   . Breast lump   . Breast pain   . Chronic pain    on MS Contin and oxycodone  . Depression   . Diabetes mellitus    diet and exercise controlled  . Escherichia coli (E. coli) infection   . Fever   . GERD (gastroesophageal reflux disease)   . History of chest pain   . History of kidney stones   . History of knee replacement, total   . Hypertension   . Hypothyroidism   . N&V (nausea and vomiting)   . Peripheral neuropathy    Right foot no sensation  . Pneumonia    2015,2014  . Sleep apnea    does not use CPAP  . Thyroid disease   . Wears glasses     Patient Active Problem List   Diagnosis Date Noted  . Obstructive sleep apnea 12/25/2018  . Change in bowel habits 03/14/2018  . Chest pain 01/26/2016  . Sepsis due to Escherichia coli ([place])   . E coli bacteremia   . E. coli UTI   . Hallucination, drug-induced ([place]) 03/12/2015  . Leukocytosis 03/12/2015  . Hypokalemia 03/12/2015  . Hypothyroidism 03/12/2015  . UTI (urinary tract infection) 03/12/2015  . Sepsis due to Gram negative bacteria ([place])   . Gram-negative bacteremia 03/11/2015  . Lap gastric bypass June 2016 11/25/2014  . Diabetes mellitus with neuropathy  ([place]) 06/20/2013  . Chronic pain of right knee 06/20/2013  . Morbid obesity ([place]) 07/05/2012    Past Surgical History:  Procedure Laterality Date  . ABDOMINAL HYSTERECTOMY     partial  . APPENDECTOMY    . BIOPSY  03/14/2018   Procedure: BIOPSY;  Surgeon: Wilford Corner, MD;  [facility];  Service: Endoscopy;;  . BREAST DUCTAL SYSTEM EXCISION Right 08/31/2017   Procedure: RIGHT BREAST CENTRAL DUCT EXCISION;  Surgeon: Erroll Luna, MD;  Location: [place];  Service: General;  Laterality: Right;  . BREAST EXCISIONAL BIOPSY Right   . BREAST LUMPECTOMY     right  . CARDIAC CATHETERIZATION     no significant CAD, nl LV function by 11/08/06 cath  . CESAREAN SECTION     x4  . COLONOSCOPY WITH PROPOFOL N/A 03/14/2018   Procedure: COLONOSCOPY WITH PROPOFOL;  Surgeon: Wilford Corner, MD;  [facility];  Service: Endoscopy;  Laterality: N/A;  . GASTRIC ROUX-EN-Y N/A 11/25/2014   Procedure: LAPAROSCOPIC ROUX-EN-Y GASTRIC BYPASS WITH UPPER ENDOSCOPY;  Surgeon: Johnathan Hausen, MD;  [facility];  Service: General;  Laterality: N/A;  . HERNIA REPAIR    . JOINT REPLACEMENT    .  KNEE SURGERY     right  . MOUTH SURGERY    . [place]  . POLYPECTOMY  03/14/2018   Procedure: POLYPECTOMY;  Surgeon: Wilford Corner, MD;  [facility];  Service: Endoscopy;;  . TOTAL KNEE REVISION  06/21/2012   Procedure: TOTAL KNEE REVISION;  Surgeon: Newt Minion, MD;  Location: [place];  Service: Orthopedics;  Laterality: Right;  Revision Right Total Knee Arthroplasty    OB History   No obstetric history on file.      Home Medications    Prior to Admission medications   Medication Sig Start Date End Date Taking? Authorizing Provider  albuterol (PROVENTIL HFA;VENTOLIN HFA) 108 (90 BASE) MCG/ACT inhaler Inhale 2 puffs into the lungs every 6 (six) hours as needed for wheezing or shortness of breath. Shortness of breath   Yes  [provider]  ALPRAZolam (XANAX) 1 MG tablet Take 1 mg by mouth 4 (four) times daily.  07/02/14  Yes [provider]  amLODipine (NORVASC) 5 MG tablet Take 5 mg by mouth daily. 06/15/16  Yes [provider]  amphetamine-dextroamphetamine (ADDERALL XR) 30 MG 24 hr capsule Take 30 mg by mouth daily.  03/10/18  Yes [provider]  amphetamine-dextroamphetamine (ADDERALL) 20 MG tablet Take 20 mg by mouth every evening.  10/13/17  Yes [provider]  cariprazine (VRAYLAR) capsule 3 mg daily.   Yes [provider]  Cyanocobalamin (VITAMIN B 12 PO) Take 1 tablet by mouth daily.    Yes [provider]  doxepin (SINEQUAN) 75 MG capsule Take 75 mg by mouth at bedtime. 03/02/18  Yes [provider]  levothyroxine (SYNTHROID, LEVOTHROID) 112 MCG tablet Take 112 mcg by mouth daily. 08/09/16  Yes [provider]  lidocaine-prilocaine (EMLA) cream Apply 1 application topically as needed. 01/29/19  Yes Hagler, Aaron Edelman, MD  linaclotide Rolan Lipa) 290 MCG CAPS capsule Take 290 mcg by mouth at bedtime.   Yes [provider]  Multiple Vitamin (MULTIVITAMIN) tablet Take 1 tablet by mouth every morning.    Yes [provider]  olmesartan-hydrochlorothiazide (BENICAR HCT) 20-12.5 MG tablet Take 1 tablet by mouth daily.    Yes [provider]  omeprazole (PRILOSEC) 40 MG capsule Take 40 mg by mouth daily.  08/14/17  Yes [provider]  VOLTAREN 1 % GEL Apply 4 g topically 4 (four) times daily. 12/25/17  Yes Tereasa Coop, PA-C  vortioxetine HBr (TRINTELLIX) 10 MG TABS tablet 10 mg daily.   Yes [provider]  cyclobenzaprine (FLEXERIL) 10 MG tablet Take 1/2 to 1 whole tablet by mouth every 8 hours as needed for muscle pain/spasms. 07/26/18   Katy Apo, NP  EPINEPHrine 0.3 mg/0.3 mL IJ SOAJ injection Inject 0.3 mg as directed as needed (severe allergic reaction).  01/11/14   [provider]   LYRICA 75 MG capsule Take 75 mg by mouth 2 (two) times daily.  08/18/17   [provider]  metoCLOPramide (REGLAN) 10 MG tablet Take 1 tablet (10 mg total) by mouth every 8 (eight) hours as needed for nausea. Patient not taking: Reported on 12/20/2018 06/30/18   Mesner, Corene Cornea, MD  morphine (MS CONTIN) 15 MG 12 hr tablet Take 1 tablet (15 mg total) by mouth every 12 (twelve) hours. 11/14/14   Bayard Hugger, NP  nitroGLYCERIN (NITROSTAT) 0.4 MG SL tablet DISSOLVE 1 TABLET UNDER THE TONGUE EVERY 5 MINUTES AS NEEDED Patient not taking: No sig reported 01/27/16   Tamala Julian,  Lynnell Dike, MD  Oxycodone HCl 10 MG TABS Take 1 tablet (10 mg total) by mouth every 6 (six) hours as needed (for pain). 11/14/14   Bayard Hugger, NP  predniSONE (STERAPRED UNI-PAK 21 TAB) 10 MG (21) TBPK tablet Take by mouth daily. Take as directed. 01/29/19   Vanessa Kick, MD  escitalopram (LEXAPRO) 20 MG tablet Take 20 mg by mouth daily.  12/14/18  [provider]  LATUDA 120 MG TABS Take 120 mg by mouth at bedtime.  03/04/18 12/14/18  [provider]  LISINOPRIL PO Take by mouth daily.    09/06/11  [provider]    Family History Family History  Problem Relation Age of Onset  . Cancer Father        colon  . Stroke Father   . Heart disease Father   . Diabetes Father   . Hypertension Father   . Depression Sister   . Hypertension Mother   . Breast cancer Neg Hx     Social History Social History   Tobacco Use  . Smoking status: Current Every Day Smoker    Packs/day: 0.25    Years: 43.00    Pack years: 10.75    Types: Cigarettes  . Smokeless tobacco: Never Used  Substance Use Topics  . Alcohol use: Yes    Comment: social  . Drug use: No     Allergies   Aspirin, Bee venom, Sulfa antibiotics, and Other   Review of Systems Review of Systems  Constitutional: Negative for chills.  HENT: Negative for ear pain and sore throat.   Eyes: Negative for pain and visual disturbance.   Respiratory: Negative for cough and shortness of breath.   Cardiovascular: Negative for palpitations.  Gastrointestinal: Negative for vomiting.  Genitourinary: Negative for hematuria.  Musculoskeletal: Positive for arthralgias and back pain.  Skin: Negative for color change and rash.  Neurological: Negative for seizures and syncope.  All other systems reviewed and are negative.    Physical Exam Triage Vital Signs ED Triage Vitals  Enc Vitals Group     BP      Pulse      Resp      Temp      Temp src      SpO2      Weight      Height      Head Circumference      Peak Flow      Pain Score      Pain Loc      Pain Edu?      Excl. in [place]?    No data found.  Updated Vital Signs BP 125/83 (BP Location: Right Arm)   Pulse 76   Temp 98.6 F (37 C) (Oral)   Resp 17   SpO2 100%   Visual Acuity Right Eye Distance:   Left Eye Distance:   Bilateral Distance:    Right Eye Near:   Left Eye Near:    Bilateral Near:     Physical Exam Vitals signs and nursing note reviewed.  Constitutional:      General: She is not in acute distress.    Appearance: She is well-developed.  HENT:     Head: Normocephalic and atraumatic.  Eyes:     Conjunctiva/sclera: Conjunctivae normal.  Neck:     Musculoskeletal: Neck supple.  Cardiovascular:     Rate and Rhythm: Normal rate and regular rhythm.     Heart sounds: No murmur.  Pulmonary:  Effort: Pulmonary effort is normal. No respiratory distress.     Breath sounds: Normal breath sounds.  Abdominal:     Palpations: Abdomen is soft.     Tenderness: There is no abdominal tenderness. There is no guarding or rebound.  Musculoskeletal:        General: No swelling, tenderness or deformity.  Skin:    General: Skin is warm and dry.     Capillary Refill: Capillary refill takes less than 2 seconds.     Findings: No bruising, erythema, lesion or rash.  Neurological:     General: No focal deficit present.     Mental Status: She is alert  and oriented to person, place, and time.     Sensory: No sensory deficit.     Motor: No weakness.     Gait: Gait normal.      UC Treatments / Results  Labs (all labs ordered are listed, but only abnormal results are displayed) Labs Reviewed - No data to display  EKG   Radiology No results found.  Procedures Procedures (including critical care time)  Medications Ordered in UC Medications  ketorolac (TORADOL) 30 MG/ML injection 30 mg (30 mg Intramuscular Given 02/10/19 1043)  ketorolac (TORADOL) 30 MG/ML injection (has no administration in time range)    Initial Impression / Assessment and Plan / UC Course  I have reviewed the triage vital signs and the nursing notes.  Pertinent labs & imaging results that were available during my care of the patient were reviewed by me and considered in my medical decision making (see chart for details).    Chronic low back pain and chronic right knee pain.  Patient has a pain contract with the pain clinic.  Injection of Toradol given today.  Instructed patient to follow-up with her pain clinic on Monday as she was asking for a refill on her opioid prescription.  Instructed patient to follow-up with her PCP as needed.  Discussed with patient that she should go to the emergency department if she has acute worsening pain, numbness, paresthesias, weakness, bowel or bladder incontinence, or other concerning symptoms.  Patient agrees with plan of care.   Final Clinical Impressions(s) / UC Diagnoses   Final diagnoses:  Chronic bilateral low back pain without sciatica  Chronic pain of right knee     Discharge Instructions     You were given an injection of Toradol today.    Follow-up with your pain clinic on Monday for a refill on your prescription.  Follow-up with your primary care provider as needed.    Go to the emergency department if you develop acute worsening pain, numbness, weakness, tingling in your leg, loss of control of your  bowel/bladder, or other concerning symptoms.        ED Prescriptions    None     Controlled Substance Prescriptions [place] Controlled Substance Registry consulted? Yes, I have consulted the [place] Controlled Substances Registry for this patient, and feel the risk/benefit ratio today is favorable for proceeding with this prescription for a controlled substance.   Sharion Balloon, NP 02/10/19 1059

## 2019-02-10 NOTE — Discharge Instructions (Addendum)
You were given an injection of Toradol today.    Follow-up with your pain clinic on Monday for a refill on your prescription.  Follow-up with your primary care provider as needed.    Go to the emergency department if you develop acute worsening pain, numbness, weakness, tingling in your leg, loss of control of your bowel/bladder, or other concerning symptoms.

## 2019-04-17 ENCOUNTER — Ambulatory Visit (INDEPENDENT_AMBULATORY_CARE_PROVIDER_SITE_OTHER): Payer: Medicare HMO | Admitting: Internal Medicine

## 2019-04-17 ENCOUNTER — Telehealth: Payer: Self-pay | Admitting: Internal Medicine

## 2019-04-17 ENCOUNTER — Other Ambulatory Visit: Payer: Self-pay

## 2019-04-17 ENCOUNTER — Encounter: Payer: Self-pay | Admitting: Internal Medicine

## 2019-04-17 VITALS — BP 120/78 | HR 73 | Temp 97.9°F | Ht 65.0 in | Wt 243.2 lb

## 2019-04-17 DIAGNOSIS — G4734 Idiopathic sleep related nonobstructive alveolar hypoventilation: Secondary | ICD-10-CM

## 2019-04-17 DIAGNOSIS — Z720 Tobacco use: Secondary | ICD-10-CM | POA: Diagnosis not present

## 2019-04-17 DIAGNOSIS — J449 Chronic obstructive pulmonary disease, unspecified: Secondary | ICD-10-CM | POA: Diagnosis not present

## 2019-04-17 MED ORDER — ANORO ELLIPTA 62.5-25 MCG/INH IN AEPB: INHALATION_SPRAY | RESPIRATORY_TRACT | 12 refills | Status: DC

## 2019-04-17 MED ORDER — ANORO ELLIPTA 62.5-25 MCG/INH IN AEPB: 1.0000 | INHALATION_SPRAY | Freq: Every day | RESPIRATORY_TRACT | 0 refills | Status: DC

## 2019-04-17 NOTE — Assessment & Plan Note (Signed)
Chronic smoker, wheezing with desat in sleep. Plan- Try Anoro, order PFT. Verifying flu and PVax status.

## 2019-04-17 NOTE — Assessment & Plan Note (Signed)
Using chantix. Long term smoker. Strongly support cessation efforts

## 2019-04-17 NOTE — Telephone Encounter (Signed)
Caroline Williams, Caroline Williams, Caroline Williams, Caroline Williams, Caroline Williams   Cc: Valerie Salts, [place]        I am forwarding this to the nurse that is working with Dr Annamaria Boots today.   Judeen Hammans   Previous Messages  ----- Message -----  From: Mariann Barter  Sent: 04/17/2019 11:16 AM EST  To: Darlina Guys, Elon Alas, *  Subject: RE: Joselyn Arrow                      got it. Please note since she has dx of OSA, she will need titrated sleep study to qualify for nocturnal o2.   ----- Message -----  From: Ilona Sorrel  Sent: 04/17/2019 10:55 AM EST  To: Darlina Guys, Elon Alas, *  Subject: Crista Luria 05/05/1961  Order has been put in by Dr Annamaria Boots.   Please advise.    [place] the following community message from [place] and [place]. Per Lenna Sciara, since the patient has a diagnosis for OSA, an ONO can not be performed. Patient will need a titrated sleep study.   Dr. Annamaria Boots, please advise. Thanks!

## 2019-04-17 NOTE — Assessment & Plan Note (Signed)
Encouraging ongoing weight loss efforts

## 2019-04-17 NOTE — Progress Notes (Signed)
57 yoF current smoker for sleep evaluation. Medical problem list includes Hypothyroid, DM2 w/ neuropathy, Morbid Obesity/  Lap Gastric Bypass, Asthma,  Med list includes Adderall( 30 AM, 20 PM), Flexeril, Doxepin( 75 hs),  -----referred by PCP, pt reports sleep study approx. 8 years ago, been on CPAP in the past ~10 years, however, stopped using the machine 4 years ago when machine broke. Marland Kitchen Epworth score 14 Body weight today 230 lbs Aware now of loud snore and wakes gasping. Daytime fatigue. Significant weight loss after bariatric surgery.  Smoking 1/2 ppd. Uses rescue inhaler most days.  04/17/2019- 58 yoF smoker followed for Nocturnal Hypoxemia, tobacco user,  complicated by Hypothyroid, DM2 w/ neuropathy, Morbid Obesity/  Lap Gastric Bypass, Asthma,  Med list includes Adderall( 30 AM, 20 PM), Flexeril, Doxepin( 75 hs), HST 01/03/2019- AHI 2.5/ hr, desaturation to 83% with time sat 89% or less = 22.7 -----minutes, body weight 230 lbs 3 month f/u HST/OSA Body weight today 243 lbs    Had flu vax Now on Chantix, tryng to stop smoking-encouraged Albuterol hfa being used 4x daily for frequent wheeze, light cough, scant clear mucus. Flu vax 1 month ago.  CXR 06/29/2018- IMPRESSION: Minimal patchy atelectasis or pneumonia at the left lateral lung base  ROS-see HPI   + = positive Constitutional:   +weight loss, night sweats, fevers, chills, fatigue, lassitude. HEENT:    +headaches, difficulty swallowing, tooth/dental problems, sore throat,       sneezing, itching, ear ache, nasal congestion, post nasal drip, snoring CV:    chest pain, orthopnea, PND, swelling in lower extremities, anasarca,                                  dizziness, palpitations Resp:   shortness of breath with exertion or at rest.                +productive cough,   non-productive cough, coughing up of blood.              change in color of mucus.  +wheezing.   Skin:    rash or lesions. GI:  No-   heartburn, indigestion,  abdominal pain, nausea, vomiting, diarrhea,                 change in bowel habits, loss of appetite GU: dysuria, change in color of urine, no urgency or frequency.   flank pain. MS:   joint pain, stiffness, decreased range of motion, back pain. Neuro-     nothing unusual Psych:  change in mood or affect.  depression or +anxiety.   memory loss.  OBJ- Physical Exam General- Alert, Oriented, Affect-appropriate, Distress- none acute, + obese Skin- rash-none, lesions- none, excoriation- none Lymphadenopathy- none Head- atraumatic            Eyes- Gross vision intact, PERRLA, conjunctivae and secretions clear            Ears- Hearing, canals-normal            Nose- Clear, no-Septal dev, mucus, polyps, erosion, perforation             Throat- Mallampati IV , mucosa clear , drainage- none, tonsils- atrophic Neck- flexible , trachea midline, no stridor , thyroid nl, carotid no bruit Chest - symmetrical excursion , unlabored           Heart/CV- RRR , no murmur , no gallop  , no rub, nl  s1 s2                           - JVD- none , edema- none, stasis changes- none, varices- none           Lung- clear to P&A, wheeze+, cough- none , dullness-none, rub- none           Chest wall-  Abd-  Br/ Gen/ Rectal- Not done, not indicated Extrem- cyanosis- none, clubbing, none, atrophy- none, strength- nl Neuro- grossly intact to observation

## 2019-04-17 NOTE — Assessment & Plan Note (Signed)
Sleep study rules out OSA at this time, but she does desaturate in sleep, probably combination of OHS and COPD. If ONOX confirms we will order sleep O2.

## 2019-04-17 NOTE — Patient Instructions (Addendum)
Order- schedule overnight oximetry   Dx nocturnal hypoxemia  Order- schedule PFT dx COPD mixed type  Order- My staff will log in the standard flu shot you got 1 month ago at [facility], and will call [facility] to find when and which pneumonia vaccine you got for our records.  Sample and printed script for Anoro maintenance inhaler    Inhale 1 puff daily.

## 2019-04-18 ENCOUNTER — Telehealth: Payer: Self-pay | Admitting: Internal Medicine

## 2019-04-18 NOTE — Telephone Encounter (Signed)
Will route to [facility] pool.

## 2019-04-18 NOTE — Telephone Encounter (Signed)
I forwarded Dr Janee Morn reply to Angela/Melissa/Jenny at [place].  Nothing further needed.

## 2019-04-18 NOTE — Telephone Encounter (Signed)
She does NOT have a dx of sleep apnea. Her sleep study AHI was 2.5/ hr.

## 2019-04-18 NOTE — Telephone Encounter (Signed)
"  Sleep study rules out OSA at this time, but she does desaturate in sleep, probably combination of OHS and COPD. If ONOX confirms we will order sleep O2. "  Patient's last office note clearly states that the patient does NOT have OSA. This was explained in a staff message to [employer].   Dr. Annamaria Boots, are you ok with me writing a letter to [employer] stating that the patient does not have OSA? Please advise. Thanks!

## 2019-04-19 NOTE — Telephone Encounter (Signed)
Yes thanks 

## 2019-04-19 NOTE — Telephone Encounter (Signed)
Letter will be typed and faxed to [employer] today address to [facility].

## 2019-06-26 ENCOUNTER — Ambulatory Visit (HOSPITAL_COMMUNITY)
Admission: EM | Admit: 2019-06-26 | Discharge: 2019-06-26 | Disposition: A | Discharge status: Home / Self Care | Payer: Medicare HMO | Attending: Family Medicine | Admitting: Family Medicine

## 2019-06-26 ENCOUNTER — Encounter (HOSPITAL_COMMUNITY): Payer: Self-pay | Admitting: Emergency Medicine

## 2019-06-26 ENCOUNTER — Other Ambulatory Visit: Payer: Self-pay

## 2019-06-26 DIAGNOSIS — I10 Essential (primary) hypertension: Secondary | ICD-10-CM | POA: Insufficient documentation

## 2019-06-26 DIAGNOSIS — M25562 Pain in left knee: Secondary | ICD-10-CM | POA: Diagnosis present

## 2019-06-26 DIAGNOSIS — N3090 Cystitis, unspecified without hematuria: Secondary | ICD-10-CM | POA: Diagnosis present

## 2019-06-26 DIAGNOSIS — M1712 Unilateral primary osteoarthritis, left knee: Secondary | ICD-10-CM | POA: Diagnosis present

## 2019-06-26 LAB — POCT URINALYSIS DIP (DEVICE)
Bilirubin Urine: NEGATIVE
Glucose, UA: NEGATIVE mg/dL
Ketones, ur: NEGATIVE mg/dL
Leukocytes,Ua: NEGATIVE
Nitrite: POSITIVE — AB
Protein, ur: NEGATIVE mg/dL
Specific Gravity, Urine: 1.025 (ref 1.005–1.030)
Urobilinogen, UA: 0.2 mg/dL (ref 0.0–1.0)
pH: 7 (ref 5.0–8.0)

## 2019-06-26 MED ORDER — CEPHALEXIN 500 MG PO CAPS: 500.0000 mg | ORAL_CAPSULE | Freq: Two times a day (BID) | ORAL | 0 refills | Status: DC

## 2019-06-26 MED ORDER — PREDNISONE 20 MG PO TABS: ORAL_TABLET | ORAL | 0 refills | Status: DC

## 2019-06-26 MED ORDER — LIDOCAINE (ANORECTAL) 5 % EX GEL: 2.0000 g | Freq: Three times a day (TID) | CUTANEOUS | 0 refills | Status: DC | PRN

## 2019-06-26 MED ORDER — FLUCONAZOLE 200 MG PO TABS: 200.0000 mg | ORAL_TABLET | ORAL | 0 refills | Status: DC

## 2019-06-26 NOTE — ED Triage Notes (Signed)
Pt c/o bilateral leg pain, pain in her knees. States shes had her R knee replaced twice, and needs her L knee replaced. States her knees have been hurting and she comes in for a shot of "toradol". Pt also c/o UTI, c/o painful urination, also urgency where she cant make it to the restroom in time and urinates on herself.

## 2019-06-26 NOTE — ED Provider Notes (Signed)
[place]   MRN: [id number] DOB: 07-01-1960  Subjective:   Caroline Williams is a 59 y.o. female presenting for 1 week history of recurrent severe left knee pain.  Patient has a history of significant arthritis, has had knee replacement in her right knee and is supposed to have it for her left knee as well.  She does use opioid medication but is not helping her current flare.  She has also had several day history of urinary frequency, urinary urgency, moderate dysuria.  States that she has had some episodes of urinary incontinence.  Has had UTIs in the past.  Regarding her blood pressure, states that she is very compliant with her blood pressure medications.  No current facility-administered medications for this encounter.  Current Outpatient Medications:    albuterol (PROVENTIL HFA;VENTOLIN HFA) 108 (90 BASE) MCG/ACT inhaler, Inhale 2 puffs into the lungs every 6 (six) hours as needed for wheezing or shortness of breath. Shortness of breath, Disp: , Rfl:    ALPRAZolam (XANAX) 1 MG tablet, Take 1 mg by mouth 4 (four) times daily. , Disp: , Rfl: 0   amLODipine (NORVASC) 5 MG tablet, Take 5 mg by mouth daily., Disp: , Rfl: 3   amphetamine-dextroamphetamine (ADDERALL XR) 30 MG 24 hr capsule, Take 30 mg by mouth daily. , Disp: , Rfl:    amphetamine-dextroamphetamine (ADDERALL) 20 MG tablet, Take 20 mg by mouth every evening. , Disp: , Rfl: 0   cariprazine (VRAYLAR) capsule, 3 mg daily., Disp: , Rfl:    CHANTIX STARTING MONTH PAK 0.5 MG X 11 & 1 MG X 42 tablet, , Disp: , Rfl:    Cyanocobalamin (VITAMIN B 12 PO), Take 1 tablet by mouth daily. , Disp: , Rfl:    cyclobenzaprine (FLEXERIL) 10 MG tablet, Take 1/2 to 1 whole tablet by mouth every 8 hours as needed for muscle pain/spasms., Disp: 20 tablet, Rfl: 0   doxepin (SINEQUAN) 75 MG capsule, Take 75 mg by mouth at bedtime., Disp: , Rfl: 0   EPINEPHrine 0.3 mg/0.3 mL IJ SOAJ injection, Inject 0.3 mg as directed as needed (severe  allergic reaction). , Disp: , Rfl:    levothyroxine (SYNTHROID, LEVOTHROID) 112 MCG tablet, Take 112 mcg by mouth daily., Disp: , Rfl: 2   lidocaine-prilocaine (EMLA) cream, Apply 1 application topically as needed., Disp: 30 g, Rfl: 0   linaclotide (LINZESS) 290 MCG CAPS capsule, Take 290 mcg by mouth at bedtime., Disp: , Rfl:    LYRICA 75 MG capsule, Take 75 mg by mouth 2 (two) times daily. , Disp: , Rfl: 5   metoCLOPramide (REGLAN) 10 MG tablet, Take 1 tablet (10 mg total) by mouth every 8 (eight) hours as needed for nausea., Disp: 30 tablet, Rfl: 0   morphine (MS CONTIN) 15 MG 12 hr tablet, Take 1 tablet (15 mg total) by mouth every 12 (twelve) hours., Disp: 60 tablet, Rfl: 0   Multiple Vitamin (MULTIVITAMIN) tablet, Take 1 tablet by mouth every morning. , Disp: , Rfl:    nitroGLYCERIN (NITROSTAT) 0.4 MG SL tablet, DISSOLVE 1 TABLET UNDER THE TONGUE EVERY 5 MINUTES AS NEEDED, Disp: 300 tablet, Rfl: 3   olmesartan-hydrochlorothiazide (BENICAR HCT) 20-12.5 MG tablet, Take 1 tablet by mouth daily. , Disp: , Rfl:    omeprazole (PRILOSEC) 40 MG capsule, Take 40 mg by mouth daily. , Disp: , Rfl: 5   Oxycodone HCl 10 MG TABS, Take 1 tablet (10 mg total) by mouth every 6 (six) hours as needed (for  pain)., Disp: 120 tablet, Rfl: 0   predniSONE (STERAPRED UNI-PAK 21 TAB) 10 MG (21) TBPK tablet, Take by mouth daily. Take as directed., Disp: 21 tablet, Rfl: 0   umeclidinium-vilanterol (ANORO ELLIPTA) 62.5-25 MCG/INH AEPB, Inhale 1 puff, once daily- maintenance, Disp: 60 each, Rfl: 12   umeclidinium-vilanterol (ANORO ELLIPTA) 62.5-25 MCG/INH AEPB, Inhale 1 puff into the lungs daily., Disp: 1 each, Rfl: 0   VOLTAREN 1 % GEL, Apply 4 g topically 4 (four) times daily., Disp: 100 g, Rfl: 11   vortioxetine HBr (TRINTELLIX) 10 MG TABS tablet, 10 mg daily., Disp: , Rfl:    Zoster Vaccine Adjuvanted (SHINGRIX) injection, Shingrix (PF) 50 mcg/0.5 mL intramuscular suspension, kit, Disp: , Rfl:     Allergies  Allergen Reactions   Aspirin Anaphylaxis and Other (See Comments)    Pt states she has had Toradol several times without any reactions   Bee Venom Anaphylaxis   Sulfa Antibiotics Anaphylaxis   Other Other (See Comments)    No blood products.  Patient did  Request that only albumin or albumin-containing products may be administered    Past Medical History:  Diagnosis Date   Anxiety    Arthritis    Asthma    Bipolar 1 disorder ([place])    Breast discharge    Breast lump    Breast pain    Chronic pain    on MS Contin and oxycodone   Depression    Diabetes mellitus    diet and exercise controlled   Escherichia coli (E. coli) infection    Fever    GERD (gastroesophageal reflux disease)    History of chest pain    History of kidney stones    History of knee replacement, total    Hypertension    Hypothyroidism    N&V (nausea and vomiting)    Peripheral neuropathy    Right foot no sensation   Pneumonia    2015,2014   Sleep apnea    does not use CPAP   Thyroid disease    Wears glasses      Past Surgical History:  Procedure Laterality Date   ABDOMINAL HYSTERECTOMY     partial   APPENDECTOMY     BIOPSY  03/14/2018   Procedure: BIOPSY;  Surgeon: Wilford Corner, MD;  Location: [facility];  Service: Endoscopy;;   BREAST DUCTAL SYSTEM EXCISION Right 08/31/2017   Procedure: RIGHT BREAST CENTRAL DUCT EXCISION;  Surgeon: Erroll Luna, MD;  Location: [facility];  Service: General;  Laterality: Right;   BREAST EXCISIONAL BIOPSY Right    BREAST LUMPECTOMY     right   CARDIAC CATHETERIZATION     no significant CAD, nl LV function by 11/08/06 cath   CESAREAN SECTION     x4   COLONOSCOPY WITH PROPOFOL N/A 03/14/2018   Procedure: COLONOSCOPY WITH PROPOFOL;  Surgeon: Wilford Corner, MD;  Location: [facility];  Service: Endoscopy;  Laterality: N/A;   GASTRIC ROUX-EN-Y N/A 11/25/2014   Procedure: LAPAROSCOPIC  ROUX-EN-Y GASTRIC BYPASS WITH UPPER ENDOSCOPY;  Surgeon: Johnathan Hausen, MD;  Location: [facility];  Service: General;  Laterality: N/A;   HERNIA REPAIR     JOINT REPLACEMENT     KNEE SURGERY     right   [facility]   POLYPECTOMY  03/14/2018   Procedure: POLYPECTOMY;  Surgeon: Wilford Corner, MD;  Location: [facility];  Service: Endoscopy;;   TOTAL KNEE REVISION  06/21/2012   Procedure: TOTAL  KNEE REVISION;  Surgeon: Newt Minion, MD;  Location: [facility];  Service: Orthopedics;  Laterality: Right;  Revision Right Total Knee Arthroplasty    Family History  Problem Relation Age of Onset   Cancer Father        colon   Stroke Father    Heart disease Father    Diabetes Father    Hypertension Father    Depression Sister    Hypertension Mother    Breast cancer Neg Hx     Social History   Tobacco Use   Smoking status: Current Every Day Smoker    Packs/day: 0.25    Years: 43.00    Pack years: 10.75    Types: Cigarettes   Smokeless tobacco: Never Used  Substance Use Topics   Alcohol use: Yes    Comment: social   Drug use: No    ROS   Objective:   Vitals: BP (!) 180/95    Pulse 65    Temp 98.1 F (36.7 C)    Resp 18    SpO2 98%   BP 162/106 on recheck by PA-Destiney Sanabia.   Physical Exam Constitutional:      General: She is not in acute distress.    Appearance: Normal appearance. She is well-developed. She is obese. She is not ill-appearing, toxic-appearing or diaphoretic.  HENT:     Head: Normocephalic and atraumatic.     Nose: Nose normal.     Mouth/Throat:     Mouth: Mucous membranes are moist.  Eyes:     Extraocular Movements: Extraocular movements intact.     Pupils: Pupils are equal, round, and reactive to light.  Cardiovascular:     Rate and Rhythm: Normal rate and regular rhythm.     Pulses: Normal pulses.     Heart sounds: Normal heart sounds. No murmur. No friction rub. No gallop.   Pulmonary:      Effort: Pulmonary effort is normal. No respiratory distress.     Breath sounds: Normal breath sounds. No stridor. No wheezing, rhonchi or rales.  Abdominal:     Tenderness: There is no right CVA tenderness or left CVA tenderness.  Skin:    General: Skin is warm and dry.     Findings: No rash.  Neurological:     Mental Status: She is alert and oriented to person, place, and time.     Coordination: Coordination abnormal (Ambulates using a cane).  Psychiatric:        Mood and Affect: Mood normal.        Behavior: Behavior normal.        Thought Content: Thought content normal.        Judgment: Judgment normal.     Results for orders placed or performed during the hospital encounter of 06/26/19 (from the past 24 hour(s))  POCT urinalysis dip (device)     Status: Abnormal   Collection Time: 06/26/19 11:02 AM  Result Value Ref Range   Glucose, UA NEGATIVE NEGATIVE mg/dL   Bilirubin Urine NEGATIVE NEGATIVE   Ketones, ur NEGATIVE NEGATIVE mg/dL   Specific Gravity, Urine 1.025 1.005 - 1.030   Hgb urine dipstick TRACE (A) NEGATIVE   pH 7.0 5.0 - 8.0   Protein, ur NEGATIVE NEGATIVE mg/dL   Urobilinogen, UA 0.2 0.0 - 1.0 mg/dL   Nitrite POSITIVE (A) NEGATIVE   Leukocytes,Ua NEGATIVE NEGATIVE    Assessment and Plan :   1. Cystitis   2. Acute pain of left knee   3. Arthritis  of left knee   4. Morbid obesity ([place])   5. Uncontrolled hypertension     Start Keflex, urine culture pending.  Recommended patient hydrate aggressively.  Will use a prednisone course for her left knee pain, maintain follow-up with orthopedist. Keep close f/u with PCP for recheck on HTN. Counseled patient on potential for adverse effects with medications prescribed/recommended today, ER and return-to-clinic precautions discussed, patient verbalized understanding.    Jaynee Eagles, PA-C 06/26/19 1125

## 2019-06-29 LAB — URINE CULTURE: Culture: >=100000 — AB

## 2019-07-05 ENCOUNTER — Other Ambulatory Visit: Payer: Self-pay

## 2019-07-05 ENCOUNTER — Ambulatory Visit: Payer: Medicare HMO | Attending: Family Medicine | Admitting: Family Medicine

## 2019-07-05 ENCOUNTER — Encounter: Payer: Self-pay | Admitting: Family Medicine

## 2019-07-05 DIAGNOSIS — R1030 Lower abdominal pain, unspecified: Secondary | ICD-10-CM | POA: Diagnosis not present

## 2019-07-05 DIAGNOSIS — K219 Gastro-esophageal reflux disease without esophagitis: Secondary | ICD-10-CM

## 2019-07-05 DIAGNOSIS — I10 Essential (primary) hypertension: Secondary | ICD-10-CM | POA: Diagnosis not present

## 2019-07-05 DIAGNOSIS — M792 Neuralgia and neuritis, unspecified: Secondary | ICD-10-CM

## 2019-07-05 DIAGNOSIS — Z79899 Other long term (current) drug therapy: Secondary | ICD-10-CM

## 2019-07-05 DIAGNOSIS — E1140 Type 2 diabetes mellitus with diabetic neuropathy, unspecified: Secondary | ICD-10-CM | POA: Diagnosis not present

## 2019-07-05 DIAGNOSIS — E039 Hypothyroidism, unspecified: Secondary | ICD-10-CM

## 2019-07-05 MED ORDER — OLMESARTAN MEDOXOMIL-HCTZ 20-12.5 MG PO TABS: 1.0000 | ORAL_TABLET | Freq: Every day | ORAL | 1 refills | Status: DC

## 2019-07-05 MED ORDER — AMLODIPINE BESYLATE 5 MG PO TABS: 5.0000 mg | ORAL_TABLET | Freq: Every day | ORAL | 1 refills | Status: DC

## 2019-07-05 MED ORDER — OMEPRAZOLE 40 MG PO CPDR: 40.0000 mg | DELAYED_RELEASE_CAPSULE | Freq: Every day | ORAL | 1 refills | Status: DC

## 2019-07-05 MED ORDER — ACCU-CHEK GUIDE VI STRP: ORAL_STRIP | 12 refills | Status: DC

## 2019-07-05 MED ORDER — LEVOTHYROXINE SODIUM 112 MCG PO TABS: 112.0000 ug | ORAL_TABLET | Freq: Every day | ORAL | 1 refills | Status: DC

## 2019-07-05 MED ORDER — LYRICA 75 MG PO CAPS: 75.0000 mg | ORAL_CAPSULE | Freq: Two times a day (BID) | ORAL | 0 refills | Status: DC

## 2019-07-05 MED ORDER — ACCU-CHEK GUIDE W/DEVICE KIT: 1.0000 | PACK | Freq: Three times a day (TID) | 0 refills | Status: DC

## 2019-07-05 NOTE — Progress Notes (Signed)
Virtual Visit via Telephone Note  I connected with Caroline Williams on 07/05/19 at  9:30 AM EST by telephone and verified that I am speaking with the correct person using two identifiers.   I discussed the limitations, risks, security and privacy concerns of performing an evaluation and management service by telephone and the availability of in person appointments. I also discussed with the patient that there may be a patient responsible charge related to this service. The patient expressed understanding and agreed to proceed.  Patient Location: Home Provider Location: [facility] Others participating in call: call initiated and transferred to me by Francisco Capuchin, CMA   History of Present Illness:        59 year old female new to the practice who reports that she has diabetes with painful neuropathy, hypertension, COPD and hypothyroidism.  She states that she has been unable to obtain refills of some of her medications.  She reports that she was most recently established at [facility] but unfortunately had difficulty obtaining a follow-up appointment.  At today's visit, she reports that she has not had any recent follow-up of her chronic medical issues.  She feels that her blood sugars are likely elevated as she has had increased thirst and urinary frequency.  She also believes that she may have a urinary tract infection as she has had some lower abdominal discomfort but denies dysuria.  She has noticed increased pain with sexual intercourse.  She is unsure of her blood sugar levels that she states that her glucometer is not working after an incident in which the glucometer was "rolled over" about 4 months ago.  She also needs refills of her thyroid medication.  She denies any current issues with excessive fatigue or constipation.  No increased peripheral edema.  She does report some chronic issues with fatigue.  She feels that her COPD is currently stable and she follows up with her pulmonologist.  She  reports that she needs a prescription for Lyrica for her pain associated with peripheral neuropathy.  She denies any current issues with chest pain or palpitations, no shortness of breath or cough, no fever or chills, no nausea/vomiting/diarrhea or constipation.  Reflux symptoms are controlled with the use of omeprazole for which she also needs refill.  Past Medical History:  Diagnosis Date  . Anxiety   . Arthritis   . Asthma   . Bipolar 1 disorder ([place])   . Breast discharge   . Breast lump   . Breast pain   . Chronic pain    on MS Contin and oxycodone  . Depression   . Diabetes mellitus    diet and exercise controlled  . Escherichia coli (E. coli) infection   . Fever   . GERD (gastroesophageal reflux disease)   . History of chest pain   . History of kidney stones   . History of knee replacement, total   . Hypertension   . Hypothyroidism   . N&V (nausea and vomiting)   . Peripheral neuropathy    Right foot no sensation  . Pneumonia    2015,2014  . Sleep apnea    does not use CPAP  . Thyroid disease   . Wears glasses     Past Surgical History:  Procedure Laterality Date  . ABDOMINAL HYSTERECTOMY     partial  . APPENDECTOMY    . BIOPSY  03/14/2018   Procedure: BIOPSY;  Surgeon: Wilford Corner, MD;  Location: [facility];  Service: Endoscopy;;  . BREAST  DUCTAL SYSTEM EXCISION Right 08/31/2017   Procedure: RIGHT BREAST CENTRAL DUCT EXCISION;  Surgeon: Erroll Luna, MD;  Location: [facility];  Service: General;  Laterality: Right;  . BREAST EXCISIONAL BIOPSY Right   . BREAST LUMPECTOMY     right  . CARDIAC CATHETERIZATION     no significant CAD, nl LV function by 11/08/06 cath  . CESAREAN SECTION     x4  . COLONOSCOPY WITH PROPOFOL N/A 03/14/2018   Procedure: COLONOSCOPY WITH PROPOFOL;  Surgeon: Wilford Corner, MD;  Location: [facility];  Service: Endoscopy;  Laterality: N/A;  . GASTRIC ROUX-EN-Y N/A 11/25/2014   Procedure: LAPAROSCOPIC ROUX-EN-Y  GASTRIC BYPASS WITH UPPER ENDOSCOPY;  Surgeon: Johnathan Hausen, MD;  Location: [facility];  Service: General;  Laterality: N/A;  . HERNIA REPAIR    . JOINT REPLACEMENT    . KNEE SURGERY     right  . MOUTH SURGERY    . [place]  . POLYPECTOMY  03/14/2018   Procedure: POLYPECTOMY;  Surgeon: Wilford Corner, MD;  Location: [facility];  Service: Endoscopy;;  . TOTAL KNEE REVISION  06/21/2012   Procedure: TOTAL KNEE REVISION;  Surgeon: Newt Minion, MD;  Location: [facility];  Service: Orthopedics;  Laterality: Right;  Revision Right Total Knee Arthroplasty    Family History  Problem Relation Age of Onset  . Cancer Father        colon  . Stroke Father   . Heart disease Father   . Diabetes Father   . Hypertension Father   . Depression Sister   . Hypertension Mother   . Breast cancer Neg Hx     Social History   Tobacco Use  . Smoking status: Former Smoker    Packs/day: 0.25    Years: 43.00    Pack years: 10.75    Types: Cigarettes  . Smokeless tobacco: Never Used  Substance Use Topics  . Alcohol use: Yes    Comment: social  . Drug use: No     Allergies  Allergen Reactions  . Aspirin Anaphylaxis and Other (See Comments)    Pt states she has had Toradol several times without any reactions  . Bee Venom Anaphylaxis  . Sulfa Antibiotics Anaphylaxis  . Other Other (See Comments)    No blood products.  Patient did  Request that only albumin or albumin-containing products may be administered       Observations/Objective: No vital signs or physical exam conducted as visit was done via telephone  Assessment and Plan: 1. Type 2 diabetes mellitus with chronic painful diabetic neuropathy ([place]) She reports that she has had no recent blood work and has been unable to check her blood sugars for the past 4 months.  Prescription sent to her pharmacy for Accu-Chek guide kit and strips for 3 times daily blood sugar testing.  She will have blood work done tomorrow  morning including hemoglobin A1c, comprehensive metabolic panel, microalbumin creatinine ratio and lipid panel.  She has been asked to make a follow-up appointment in approximately 4 weeks and in the interim, once lab results are known, we will contact the patient regarding medical management as it does not appear that she is currently on any medications for the treatment of her diabetes.  She reports that she was previously checking her blood sugars 3 times daily.  She believes that her sugars are currently high due to her symptoms of increased thirst and urinary frequency.  She will also need diabetic foot exam  and ophthalmology referral which will be discussed at her upcoming visit.  On review of chart, her last hemoglobin A1c was done 03/12/2015 and was 6.4.  She of course may have had blood work done at other places that may not be included in labs for this computer system. - Blood Glucose Monitoring Suppl (ACCU-CHEK GUIDE) w/Device KIT; 1 kit by Does not apply route 3 (three) times daily. To check blood sugars  Dispense: 1 kit; Refill: 0 - glucose blood (ACCU-CHEK GUIDE) test strip; Use as instructed  Dispense: 100 each; Refill: 12 - Comprehensive metabolic panel; Future - Hemoglobin A1c; Future - Microalbumin/Creatinine Ratio, Urine; Future - Lipid panel; Future  2. Essential hypertension She believes that her blood pressure is controlled on her current medication and refills provided of Benicar HCTZ and amlodipine.  She will have lab visit tomorrow for microalbumin creatinine ratio and lipid panel as well as renal function as part of CMP. - olmesartan-hydrochlorothiazide (BENICAR HCT) 20-12.5 MG tablet; Take 1 tablet by mouth daily.  Dispense: 90 tablet; Refill: 1 - amLODipine (NORVASC) 5 MG tablet; Take 1 tablet (5 mg total) by mouth daily.  Dispense: 90 tablet; Refill: 1 - Microalbumin/Creatinine Ratio, Urine; Future - Lipid panel; Future  3. Neuropathic pain Patient request prescription for  Lyrica for treatment of chronic neuropathic pain.  Per her medication list it also appears that she is chronically on opioid therapy for chronic pain.  Prescription provided for Lyrica for 29-monthsupply with no refills.  Will discuss with patient at her follow-up visit if this medication in the future needs to be prescribed by her pain management physician.  She will have comprehensive metabolic panel in follow-up of chronic medications for the treatment of neuropathic pain. - LYRICA 75 MG capsule; Take 1 capsule (75 mg total) by mouth 2 (two) times daily.  Dispense: 180 capsule; Refill: 0 - Comprehensive metabolic panel; Future  4. Lower abdominal pain Patient with complaint of lower abdominal pain which she believes is related to and feels like similar urinary tract infections in the past.  She will have CBC and urinalysis with reflex culture when she comes into the office tomorrow for blood work. - CBC; Future - UA/M w/rflx Culture, Routine; Future  5. Gastroesophageal reflux disease, unspecified whether esophagitis present She reports stable reflux symptoms with the use of omeprazole and refill provided at today's visit. - omeprazole (PRILOSEC) 40 MG capsule; Take 1 capsule (40 mg total) by mouth daily.  Dispense: 90 capsule; Refill: 1  6. Hypothyroidism, unspecified type She reports that she needs refill of her thyroid medication levothyroxine 112 mcg.  On review of chart, there are no recent labs in follow-up of her hypothyroidism.  She will have T4 and TSH at her lab visit tomorrow and she will be notified if her dose of medication requires adjustment based on the lab results - levothyroxine (SYNTHROID) 112 MCG tablet; Take 1 tablet (112 mcg total) by mouth daily.  Dispense: 90 tablet; Refill: 1 - T4 AND TSH; Future  7. Encounter for long-term (current) use of high-risk medication Patient will come into the office tomorrow for lab work and have comprehensive metabolic panel in follow-up of  use of high-risk medications including psychiatric medications, blood pressure medications, opioid therapy, and Lyrica. - Comprehensive metabolic panel; Future  Follow Up Instructions: Return in about 4 weeks (around 08/02/2019) for Chronic issues; lab visit tomorrow.    I discussed the assessment and treatment plan with the patient. The patient was provided an opportunity  to ask questions and all were answered. The patient agreed with the plan and demonstrated an understanding of the instructions.   The patient was advised to call back or seek an in-person evaluation if the symptoms worsen or if the condition fails to improve as anticipated.  I provided 16 minutes of non-face-to-face time during this encounter.  An additional 12 minutes was spent on review of chart, note completion, medication refills and placement of orders for upcoming labs  Antony Blackbird, MD

## 2019-07-05 NOTE — Progress Notes (Signed)
Pt. Is here to establish care.

## 2019-07-06 ENCOUNTER — Ambulatory Visit: Payer: Medicare HMO | Attending: Family Medicine

## 2019-07-06 ENCOUNTER — Other Ambulatory Visit: Payer: Self-pay

## 2019-07-06 DIAGNOSIS — Z79899 Other long term (current) drug therapy: Secondary | ICD-10-CM

## 2019-07-06 DIAGNOSIS — M792 Neuralgia and neuritis, unspecified: Secondary | ICD-10-CM

## 2019-07-06 DIAGNOSIS — I10 Essential (primary) hypertension: Secondary | ICD-10-CM

## 2019-07-06 DIAGNOSIS — E039 Hypothyroidism, unspecified: Secondary | ICD-10-CM

## 2019-07-06 DIAGNOSIS — R1030 Lower abdominal pain, unspecified: Secondary | ICD-10-CM

## 2019-07-06 DIAGNOSIS — E1140 Type 2 diabetes mellitus with diabetic neuropathy, unspecified: Secondary | ICD-10-CM

## 2019-07-07 LAB — CBC
Hematocrit: 42.9 % (ref 34.0–46.6)
Hemoglobin: 14.2 g/dL (ref 11.1–15.9)
MCH: 28.1 pg (ref 26.6–33.0)
MCHC: 33.1 g/dL (ref 31.5–35.7)
MCV: 85 fL (ref 79–97)
Platelets: 277 x10E3/uL (ref 150–450)
RBC: 5.06 x10E6/uL (ref 3.77–5.28)
RDW: 14 % (ref 11.7–15.4)
WBC: 5.5 x10E3/uL (ref 3.4–10.8)

## 2019-07-07 LAB — MICROALBUMIN / CREATININE URINE RATIO
Creatinine, Urine: 176.5 mg/dL
Microalb/Creat Ratio: 14 mg/g{creat} (ref 0–29)
Microalbumin, Urine: 25 ug/mL

## 2019-07-07 LAB — UA/M W/RFLX CULTURE, ROUTINE
Bilirubin, UA: NEGATIVE
Glucose, UA: NEGATIVE
Ketones, UA: NEGATIVE
Leukocytes,UA: NEGATIVE
Nitrite, UA: NEGATIVE
Protein,UA: NEGATIVE
RBC, UA: NEGATIVE
Specific Gravity, UA: 1.023 (ref 1.005–1.030)
Urobilinogen, Ur: 0.2 mg/dL (ref 0.2–1.0)
pH, UA: 6 (ref 5.0–7.5)

## 2019-07-07 LAB — LIPID PANEL
Chol/HDL Ratio: 2.9 ratio (ref 0.0–4.4)
Cholesterol, Total: 213 mg/dL — ABNORMAL HIGH (ref 100–199)
HDL: 74 mg/dL
LDL Chol Calc (NIH): 121 mg/dL — ABNORMAL HIGH (ref 0–99)
Triglycerides: 100 mg/dL (ref 0–149)
VLDL Cholesterol Cal: 18 mg/dL (ref 5–40)

## 2019-07-07 LAB — T4 AND TSH
T4, Total: 7 ug/dL (ref 4.5–12.0)
TSH: 14.6 u[IU]/mL — ABNORMAL HIGH (ref 0.450–4.500)

## 2019-07-07 LAB — COMPREHENSIVE METABOLIC PANEL WITH GFR
ALT: 16 IU/L (ref 0–32)
AST: 18 IU/L (ref 0–40)
Albumin/Globulin Ratio: 1.8 (ref 1.2–2.2)
Albumin: 4.6 g/dL (ref 3.8–4.9)
Alkaline Phosphatase: 116 IU/L (ref 39–117)
BUN/Creatinine Ratio: 9 (ref 9–23)
BUN: 9 mg/dL (ref 6–24)
Bilirubin Total: 0.2 mg/dL (ref 0.0–1.2)
CO2: 29 mmol/L (ref 20–29)
Calcium: 9.8 mg/dL (ref 8.7–10.2)
Chloride: 102 mmol/L (ref 96–106)
Creatinine, Ser: 1.05 mg/dL — ABNORMAL HIGH (ref 0.57–1.00)
GFR calc Af Amer: 67 mL/min/1.73
GFR calc non Af Amer: 58 mL/min/1.73 — ABNORMAL LOW
Globulin, Total: 2.5 g/dL (ref 1.5–4.5)
Glucose: 97 mg/dL (ref 65–99)
Potassium: 4 mmol/L (ref 3.5–5.2)
Sodium: 143 mmol/L (ref 134–144)
Total Protein: 7.1 g/dL (ref 6.0–8.5)

## 2019-07-07 LAB — MICROSCOPIC EXAMINATION
Casts: NONE SEEN /LPF
Epithelial Cells (non renal): >10 /HPF — AB (ref 0–10)

## 2019-07-07 LAB — HEMOGLOBIN A1C
Est. average glucose Bld gHb Est-mCnc: 123 mg/dL
Hgb A1c MFr Bld: 5.9 % — ABNORMAL HIGH (ref 4.8–5.6)

## 2019-07-12 ENCOUNTER — Ambulatory Visit: Payer: Medicare HMO | Attending: Internal Medicine

## 2019-07-12 DIAGNOSIS — Z20822 Contact with and (suspected) exposure to covid-19: Secondary | ICD-10-CM

## 2019-07-13 LAB — NOVEL CORONAVIRUS, NAA: SARS-CoV-2, NAA: NOT DETECTED

## 2019-07-17 ENCOUNTER — Other Ambulatory Visit: Payer: Self-pay | Admitting: Family Medicine

## 2019-07-17 DIAGNOSIS — E1140 Type 2 diabetes mellitus with diabetic neuropathy, unspecified: Secondary | ICD-10-CM

## 2019-07-17 DIAGNOSIS — E7800 Pure hypercholesterolemia, unspecified: Secondary | ICD-10-CM

## 2019-07-17 NOTE — Progress Notes (Signed)
Patient ID: Caroline Williams, female   DOB: [birth year], 60 y.o.   MRN: [id number]   Patient was recently notified of her lab results and wishes to be on [place] which she states was prescribed for her in the past.  Patient will be referred to cardiology to see if this medication is warranted.  Her most recent lipid panel on 07/06/2019 showed a total cholesterol of 213 and LDL of 121 with triglycerides of 100 and HDL of 74.  She does have increased cardiovascular risk due to her diabetes and other chronic medical issues.

## 2019-07-24 ENCOUNTER — Telehealth: Payer: Self-pay | Admitting: Family Medicine

## 2019-07-24 NOTE — Telephone Encounter (Signed)
Patient requesting a referral for [facility]. She is Dr. Siri Cole patient. Patient expressed she is displeased with her current pain management physician.  Patient wanted to let know Dr. Chapman Fitch know she has an appointment with Dr.Robert Smith @[facility].   Patient reported she is experiencing frequent urination during the day ( a previous issue). Patient requested if possible to be placed back on her previous antibiotic.

## 2019-07-24 NOTE — Telephone Encounter (Signed)
I will place the referral to [facility].  I will place a new referral for pain management, is there a certain physician/group that she would like to see?  She should not stop seeing her current pain medication provider because it may take some time to get her set up with a new provider and she should also be aware that the new pain management provider may change her medication regimen.  She may wish to go to urgent care for treatment of her urinary symptoms if there is not an appointment available with a provider through [facility] for her to be seen today or tomorrow

## 2019-07-24 NOTE — Telephone Encounter (Signed)
Pt requested referral to [facility] to loose weight. New referral for pain management / requested to change PM provider. Stated she is not able to hold urine and waking up 5 times last night to go pee. Denies any burning on urination. Cannot control her bladder. Stated some urinary discomfort X 3 days. Have tried OTC remedies, no help.

## 2019-07-25 ENCOUNTER — Other Ambulatory Visit: Payer: Self-pay | Admitting: Family Medicine

## 2019-07-25 DIAGNOSIS — Z9884 Bariatric surgery status: Secondary | ICD-10-CM

## 2019-07-25 DIAGNOSIS — E1140 Type 2 diabetes mellitus with diabetic neuropathy, unspecified: Secondary | ICD-10-CM

## 2019-07-25 DIAGNOSIS — M792 Neuralgia and neuritis, unspecified: Secondary | ICD-10-CM

## 2019-07-25 NOTE — Progress Notes (Signed)
Patient ID: Caroline Williams, female   DOB: 08-21-1960, 59 y.o.   MRN: [id number]   Patient left phone message requesting referral to [facility] as well as referral to a new pain management practice.

## 2019-07-26 NOTE — Telephone Encounter (Signed)
Reached patient / made her aware of Md instructions. Verbalized understating

## 2019-07-31 ENCOUNTER — Encounter (HOSPITAL_COMMUNITY): Payer: Self-pay

## 2019-07-31 ENCOUNTER — Other Ambulatory Visit: Payer: Self-pay

## 2019-07-31 ENCOUNTER — Ambulatory Visit (HOSPITAL_COMMUNITY)
Admission: EM | Admit: 2019-07-31 | Discharge: 2019-07-31 | Disposition: A | Discharge status: Home / Self Care | Payer: Medicare HMO | Attending: Family Medicine | Admitting: Family Medicine

## 2019-07-31 DIAGNOSIS — M25562 Pain in left knee: Secondary | ICD-10-CM | POA: Diagnosis not present

## 2019-07-31 DIAGNOSIS — G8929 Other chronic pain: Secondary | ICD-10-CM | POA: Diagnosis not present

## 2019-07-31 DIAGNOSIS — I10 Essential (primary) hypertension: Secondary | ICD-10-CM | POA: Diagnosis not present

## 2019-07-31 MED ORDER — PREDNISONE 10 MG (21) PO TBPK: ORAL_TABLET | ORAL | 0 refills | Status: DC

## 2019-07-31 MED ORDER — KETOROLAC TROMETHAMINE 30 MG/ML IJ SOLN
INTRAMUSCULAR | Status: AC
  Filled 2019-07-31: qty 1

## 2019-07-31 MED ORDER — KETOROLAC TROMETHAMINE 30 MG/ML IJ SOLN
30.0000 mg | Freq: Once | INTRAMUSCULAR | Status: AC
  Administered 2019-07-31: 30 mg via INTRAMUSCULAR

## 2019-07-31 NOTE — Discharge Instructions (Signed)
Toradol given here for pain. Prednisone sent to the pharmacy.  Take this medication with food.  Follow-up with Dr. Alvan Dame as planned

## 2019-07-31 NOTE — ED Triage Notes (Signed)
Pt state she has left knee pain. Pt state she usually comes and get a shot for the pain. Pt states then it gets better.

## 2019-08-01 NOTE — ED Provider Notes (Signed)
[place]    CSN: [id number] Arrival date & time: 07/31/19  1310      History   Chief Complaint Chief Complaint  Patient presents with  . Knee Pain    HPI Caroline Williams is a 59 y.o. female.   Patient is a 59 year old female with past medical history of anxiety, arthritis, asthma, bipolar, chronic pain on MS Contin and oxycodone, GERD, peripheral neuropathy, pneumonia, sleep apnea, hypertension, hypothyroidism.  She presents today for her chronic left knee pain.  Has been following with her orthopedic and needs knee replacement after she loses weight.  The pain is used to control with her pain medication but sometimes the pain gets worsening.  This episode has been for the past couple days.  She has been resting the knee, icing the knee.  She walks with a cane.  Denies any numbness, tingling or weakness.  Sometimes the pain radiates up the leg.  No fever, chills or body aches.  She is requesting a Toradol shot.  ROS per HPI      Past Medical History:  Diagnosis Date  . Anxiety   . Arthritis   . Asthma   . Bipolar 1 disorder ([place])   . Breast discharge   . Breast lump   . Breast pain   . Chronic pain    on MS Contin and oxycodone  . Depression   . Diabetes mellitus    diet and exercise controlled  . Escherichia coli (E. coli) infection   . Fever   . GERD (gastroesophageal reflux disease)   . History of chest pain   . History of kidney stones   . History of knee replacement, total   . Hypertension   . Hypothyroidism   . N&V (nausea and vomiting)   . Peripheral neuropathy    Right foot no sensation  . Pneumonia    2015,2014  . Sleep apnea    does not use CPAP  . Thyroid disease   . Wears glasses     Patient Active Problem List   Diagnosis Date Noted  . COPD mixed type ([place]) 04/17/2019  . Tobacco user 04/17/2019  . Nocturnal hypoxemia 12/25/2018  . Change in bowel habits 03/14/2018  . Chest pain 01/26/2016  . Sepsis due to Escherichia coli  ([place])   . E coli bacteremia   . E. coli UTI   . Hallucination, drug-induced ([place]) 03/12/2015  . Leukocytosis 03/12/2015  . Hypokalemia 03/12/2015  . Hypothyroidism 03/12/2015  . UTI (urinary tract infection) 03/12/2015  . Sepsis due to Gram negative bacteria ([place])   . Gram-negative bacteremia 03/11/2015  . Lap gastric bypass June 2016 11/25/2014  . Diabetes mellitus with neuropathy ([place]) 06/20/2013  . Chronic pain of right knee 06/20/2013  . Morbid obesity ([place]) 07/05/2012    Past Surgical History:  Procedure Laterality Date  . ABDOMINAL HYSTERECTOMY     partial  . APPENDECTOMY    . BIOPSY  03/14/2018   Procedure: BIOPSY;  Surgeon: Wilford Corner, MD;  Location: [facility];  Service: Endoscopy;;  . BREAST DUCTAL SYSTEM EXCISION Right 08/31/2017   Procedure: RIGHT BREAST CENTRAL DUCT EXCISION;  Surgeon: Erroll Luna, MD;  Location: [facility];  Service: General;  Laterality: Right;  . BREAST EXCISIONAL BIOPSY Right   . BREAST LUMPECTOMY     right  . CARDIAC CATHETERIZATION     no significant CAD, nl LV function by 11/08/06 cath  . CESAREAN SECTION     x4  .  COLONOSCOPY WITH PROPOFOL N/A 03/14/2018   Procedure: COLONOSCOPY WITH PROPOFOL;  Surgeon: Wilford Corner, MD;  Location: [facility];  Service: Endoscopy;  Laterality: N/A;  . GASTRIC ROUX-EN-Y N/A 11/25/2014   Procedure: LAPAROSCOPIC ROUX-EN-Y GASTRIC BYPASS WITH UPPER ENDOSCOPY;  Surgeon: Johnathan Hausen, MD;  Location: [facility];  Service: General;  Laterality: N/A;  . HERNIA REPAIR    . JOINT REPLACEMENT    . KNEE SURGERY     right  . MOUTH SURGERY    . [place]  . POLYPECTOMY  03/14/2018   Procedure: POLYPECTOMY;  Surgeon: Wilford Corner, MD;  Location: [facility];  Service: Endoscopy;;  . TOTAL KNEE REVISION  06/21/2012   Procedure: TOTAL KNEE REVISION;  Surgeon: Newt Minion, MD;  Location: [facility];  Service: Orthopedics;  Laterality: Right;  Revision Right Total  Knee Arthroplasty    OB History   No obstetric history on file.      Home Medications    Prior to Admission medications   Medication Sig Start Date End Date Taking? Authorizing Provider  albuterol (PROVENTIL HFA;VENTOLIN HFA) 108 (90 BASE) MCG/ACT inhaler Inhale 2 puffs into the lungs every 6 (six) hours as needed for wheezing or shortness of breath. Shortness of breath    [provider]  ALPRAZolam (XANAX) 1 MG tablet Take 1 mg by mouth 4 (four) times daily.  07/02/14   [provider]  amLODipine (NORVASC) 5 MG tablet Take 1 tablet (5 mg total) by mouth daily. 07/05/19   Fulp, Cammie, MD  amphetamine-dextroamphetamine (ADDERALL XR) 30 MG 24 hr capsule Take 30 mg by mouth daily.  03/10/18   [provider]  amphetamine-dextroamphetamine (ADDERALL) 20 MG tablet Take 10 mg by mouth every evening. Pt. Is taking 4m in the evening. 10/13/17   [provider]  Blood Glucose Monitoring Suppl (ACCU-CHEK GUIDE) w/Device KIT 1 kit by Does not apply route 3 (three) times daily. To check blood sugars 07/05/19   Fulp, Cammie, MD  cariprazine (VRAYLAR) capsule 3 mg daily. 4.5 mg daily    [provider]  cephALEXin (KEFLEX) 500 MG capsule Take 1 capsule (500 mg total) by mouth 2 (two) times daily. 06/26/19   MJaynee Eagles PA-C  CHANTIX STARTING MONTH PAK 0.5 MG X 11 & 1 MG X 42 tablet  04/03/19   [provider]  Cyanocobalamin (VITAMIN B 12 PO) Take 1 tablet by mouth daily.     [provider]  cyclobenzaprine (FLEXERIL) 10 MG tablet Take 1/2 to 1 whole tablet by mouth every 8 hours as needed for muscle pain/spasms. 07/26/18   AKaty Apo NP  doxepin (SINEQUAN) 75 MG capsule Take 75 mg by mouth at bedtime. 03/02/18   [provider]  EPINEPHrine 0.3 mg/0.3 mL IJ SOAJ injection Inject 0.3 mg as directed as needed (severe allergic reaction).  01/11/14   [provider]  fluconazole (DIFLUCAN) 200 MG tablet Take 1 tablet (200  mg total) by mouth once a week. 06/26/19   MJaynee Eagles PA-C  glucose blood (ACCU-CHEK GUIDE) test strip Use as instructed 07/05/19   Fulp, Cammie, MD  levothyroxine (SYNTHROID) 112 MCG tablet Take 1 tablet (112 mcg total) by mouth daily. 07/05/19   Fulp, Cammie, MD  Lidocaine, Anorectal, 5 % GEL Apply 2 g topically 3 (three) times daily as needed. Apply a pea-sized amount to the affected area 3 times daily. 06/26/19   MJaynee Eagles PA-C  lidocaine-prilocaine (EMLA) cream Apply 1 application topically as  needed. 01/29/19   Vanessa Kick, MD  linaclotide (LINZESS) 290 MCG CAPS capsule Take 290 mcg by mouth at bedtime.    [provider]  LYRICA 75 MG capsule Take 1 capsule (75 mg total) by mouth 2 (two) times daily. 07/05/19   Fulp, Cammie, MD  metoCLOPramide (REGLAN) 10 MG tablet Take 1 tablet (10 mg total) by mouth every 8 (eight) hours as needed for nausea. Patient not taking: Reported on 07/05/2019 06/30/18   Mesner, Corene Cornea, MD  morphine (MS CONTIN) 15 MG 12 hr tablet Take 1 tablet (15 mg total) by mouth every 12 (twelve) hours. 11/14/14   Bayard Hugger, NP  Multiple Vitamin (MULTIVITAMIN) tablet Take 1 tablet by mouth every morning.     [provider]  nitroGLYCERIN (NITROSTAT) 0.4 MG SL tablet DISSOLVE 1 TABLET UNDER THE TONGUE EVERY 5 MINUTES AS NEEDED Patient not taking: Reported on 07/05/2019 01/27/16   Belva Crome, MD  olmesartan-hydrochlorothiazide (BENICAR HCT) 20-12.5 MG tablet Take 1 tablet by mouth daily. 07/05/19   Fulp, Cammie, MD  omeprazole (PRILOSEC) 40 MG capsule Take 1 capsule (40 mg total) by mouth daily. 07/05/19   Fulp, Cammie, MD  Oxycodone HCl 10 MG TABS Take 1 tablet (10 mg total) by mouth every 6 (six) hours as needed (for pain). 11/14/14   Bayard Hugger, NP  predniSONE (STERAPRED UNI-PAK 21 TAB) 10 MG (21) TBPK tablet 6 tabs for 1 day, then 5 tabs for 1 das, then 4 tabs for 1 day, then 3 tabs for 1 day, 2 tabs for 1 day, then 1 tab for 1 day 07/31/19   Loura Halt A, NP  umeclidinium-vilanterol (ANORO ELLIPTA) 62.5-25 MCG/INH AEPB Inhale 1 puff, once daily- maintenance Patient not taking: Reported on 07/05/2019 04/17/19   Deneise Lever, MD  umeclidinium-vilanterol (ANORO ELLIPTA) 62.5-25 MCG/INH AEPB Inhale 1 puff into the lungs daily. 04/17/19   Baird Lyons D, MD  VOLTAREN 1 % GEL Apply 4 g topically 4 (four) times daily. 12/25/17   Tereasa Coop, PA-C  vortioxetine HBr (TRINTELLIX) 10 MG TABS tablet 10 mg daily.    [provider]  Zoster Vaccine Adjuvanted [facility]) injection Shingrix (PF) 50 mcg/0.5 mL intramuscular suspension, kit    [provider]  escitalopram (LEXAPRO) 20 MG tablet Take 20 mg by mouth daily.  12/14/18  [provider]  LATUDA 120 MG TABS Take 120 mg by mouth at bedtime.  03/04/18 12/14/18  [provider]  LISINOPRIL PO Take by mouth daily.    09/06/11  [provider]    Family History Family History  Problem Relation Age of Onset  . Cancer Father        colon  . Stroke Father   . Heart disease Father   . Diabetes Father   . Hypertension Father   . Depression Sister   . Hypertension Mother   . Breast cancer Neg Hx     Social History Social History   Tobacco Use  . Smoking status: Former Smoker    Packs/day: 0.25    Years: 43.00    Pack years: 10.75    Types: Cigarettes  . Smokeless tobacco: Never Used  Substance Use Topics  . Alcohol use: Yes    Comment: social  . Drug use: No     Allergies   Aspirin, Bee venom, Sulfa antibiotics, and Other   Review of Systems Review of Systems   Physical Exam Triage Vital Signs ED Triage Vitals  Enc  Vitals Group     BP 07/31/19 1359 (!) 145/109     Pulse Rate 07/31/19 1359 82     Resp 07/31/19 1359 18     Temp 07/31/19 1359 98.7 F (37.1 C)     Temp Source 07/31/19 1359 Oral     SpO2 07/31/19 1359 98 %     Weight 07/31/19 1356 240 lb (108.9 kg)     Height --      Head Circumference --      Peak Flow  --      Pain Score 07/31/19 1356 10     Pain Loc --      Pain Edu? --      Excl. in [place]? --    No data found.  Updated Vital Signs BP (!) 145/109 (BP Location: Right Arm)   Pulse 82   Temp 98.7 F (37.1 C) (Oral)   Resp 18   Wt 240 lb (108.9 kg)   SpO2 98%   BMI 39.94 kg/m   Visual Acuity Right Eye Distance:   Left Eye Distance:   Bilateral Distance:    Right Eye Near:   Left Eye Near:    Bilateral Near:     Physical Exam Vitals and nursing note reviewed.  Constitutional:      General: She is not in acute distress.    Appearance: Normal appearance. She is not ill-appearing, toxic-appearing or diaphoretic.  HENT:     Head: Normocephalic.     Nose: Nose normal.  Eyes:     Conjunctiva/sclera: Conjunctivae normal.  Pulmonary:     Effort: Pulmonary effort is normal.  Musculoskeletal:        General: Normal range of motion.     Cervical back: Normal range of motion.  Skin:    General: Skin is warm and dry.     Findings: No rash.  Neurological:     Mental Status: She is alert.  Psychiatric:        Mood and Affect: Mood normal.      UC Treatments / Results  Labs (all labs ordered are listed, but only abnormal results are displayed) Labs Reviewed - No data to display  EKG   Radiology No results found.  Procedures Procedures (including critical care time)  Medications Ordered in UC Medications  ketorolac (TORADOL) 30 MG/ML injection 30 mg (30 mg Intramuscular Given 07/31/19 1439)    Initial Impression / Assessment and Plan / UC Course  I have reviewed the triage vital signs and the nursing notes.  Pertinent labs & imaging results that were available during my care of the patient were reviewed by me and considered in my medical decision making (see chart for details).     Chronic knee pain due to osteoarthritis and needs knee replacement. Toradol injection given here for pain as requested We will do steroid taper over the next 6 days to help with  pain, inflammation and swelling Follow-up with orthopedic specialist as planned Final Clinical Impressions(s) / UC Diagnoses   Final diagnoses:  Chronic pain of left knee     Discharge Instructions     Toradol given here for pain. Prednisone sent to the pharmacy.  Take this medication with food.  Follow-up with Dr. Alvan Dame as planned    ED Prescriptions    Medication Sig Dispense Auth. Provider   predniSONE (STERAPRED UNI-PAK 21 TAB) 10 MG (21) TBPK tablet 6 tabs for 1 day, then 5 tabs for 1 das, then 4 tabs for 1  day, then 3 tabs for 1 day, 2 tabs for 1 day, then 1 tab for 1 day 21 tablet Krystel Fletchall A, NP     PDMP not reviewed this encounter.   Loura Halt A, NP 08/01/19 1031

## 2019-08-06 ENCOUNTER — Encounter (HOSPITAL_COMMUNITY): Payer: Self-pay

## 2019-08-06 ENCOUNTER — Ambulatory Visit: Payer: Medicare HMO | Admitting: Family Medicine

## 2019-08-06 ENCOUNTER — Ambulatory Visit (HOSPITAL_COMMUNITY)
Admission: EM | Admit: 2019-08-06 | Discharge: 2019-08-06 | Disposition: A | Discharge status: Home / Self Care | Payer: Medicare HMO | Attending: Physician Assistant | Admitting: Physician Assistant

## 2019-08-06 ENCOUNTER — Other Ambulatory Visit: Payer: Self-pay

## 2019-08-06 DIAGNOSIS — G8929 Other chronic pain: Secondary | ICD-10-CM | POA: Diagnosis not present

## 2019-08-06 DIAGNOSIS — M25562 Pain in left knee: Secondary | ICD-10-CM

## 2019-08-06 MED ORDER — KETOROLAC TROMETHAMINE 30 MG/ML IJ SOLN
INTRAMUSCULAR | Status: AC
  Filled 2019-08-06: qty 1

## 2019-08-06 MED ORDER — KETOROLAC TROMETHAMINE 30 MG/ML IJ SOLN
30.0000 mg | Freq: Once | INTRAMUSCULAR | Status: AC
  Administered 2019-08-06: 30 mg via INTRAMUSCULAR

## 2019-08-06 NOTE — ED Provider Notes (Signed)
[place]    CSN: [id number] Arrival date & time: 08/06/19  [place]      History   Chief Complaint Chief Complaint  Patient presents with  . Knee Pain    HPI Caroline Williams is a 59 y.o. female.   Presented today with complaint of acute on chronic left knee and leg pain.  She reports a long history of neuropathic-like pain in her left leg.  She reports" bone-on-bone" intact in her left knee that has long given her much pain.  She reports the pain starts in her ankle and travels up her leg.  She reports it is sharp and shooting.  She also reports pain specifically in her left knee.  She reports using a cane to ambulate.  She is on a large amount of pain medications to include morphine, oxycodone, Lyrica.  She has a pain management specialist and an orthopedic provider that she sees.  She was most recently in urgent care for the same complaint on 07/31/2019.  She reports that she often needs to come in for a Toradol injection to help with this pain.  At the 07/31/2019 visit she was placed on a prednisone taper following a Toradol injection.  She had relief from these treatments however the pain has returned.     Past Medical History:  Diagnosis Date  . Anxiety   . Arthritis   . Asthma   . Bipolar 1 disorder ([place])   . Breast discharge   . Breast lump   . Breast pain   . Chronic pain    on MS Contin and oxycodone  . Depression   . Diabetes mellitus    diet and exercise controlled  . Escherichia coli (E. coli) infection   . Fever   . GERD (gastroesophageal reflux disease)   . History of chest pain   . History of kidney stones   . History of knee replacement, total   . Hypertension   . Hypothyroidism   . N&V (nausea and vomiting)   . Peripheral neuropathy    Right foot no sensation  . Pneumonia    2015,2014  . Sleep apnea    does not use CPAP  . Thyroid disease   . Wears glasses     Patient Active Problem List   Diagnosis Date Noted  . COPD mixed type  ([place]) 04/17/2019  . Tobacco user 04/17/2019  . Nocturnal hypoxemia 12/25/2018  . Change in bowel habits 03/14/2018  . Chest pain 01/26/2016  . Sepsis due to Escherichia coli ([place])   . E coli bacteremia   . E. coli UTI   . Hallucination, drug-induced ([place]) 03/12/2015  . Leukocytosis 03/12/2015  . Hypokalemia 03/12/2015  . Hypothyroidism 03/12/2015  . UTI (urinary tract infection) 03/12/2015  . Sepsis due to Gram negative bacteria ([place])   . Gram-negative bacteremia 03/11/2015  . Lap gastric bypass June 2016 11/25/2014  . Diabetes mellitus with neuropathy ([place]) 06/20/2013  . Chronic pain of right knee 06/20/2013  . Morbid obesity ([place]) 07/05/2012    Past Surgical History:  Procedure Laterality Date  . ABDOMINAL HYSTERECTOMY     partial  . APPENDECTOMY    . BIOPSY  03/14/2018   Procedure: BIOPSY;  Surgeon: Wilford Corner, MD;  Location: [facility];  Service: Endoscopy;;  . BREAST DUCTAL SYSTEM EXCISION Right 08/31/2017   Procedure: RIGHT BREAST CENTRAL DUCT EXCISION;  Surgeon: Erroll Luna, MD;  Location: [facility];  Service: General;  Laterality: Right;  .  BREAST EXCISIONAL BIOPSY Right   . BREAST LUMPECTOMY     right  . CARDIAC CATHETERIZATION     no significant CAD, nl LV function by 11/08/06 cath  . CESAREAN SECTION     x4  . COLONOSCOPY WITH PROPOFOL N/A 03/14/2018   Procedure: COLONOSCOPY WITH PROPOFOL;  Surgeon: Wilford Corner, MD;  Location: [facility];  Service: Endoscopy;  Laterality: N/A;  . GASTRIC ROUX-EN-Y N/A 11/25/2014   Procedure: LAPAROSCOPIC ROUX-EN-Y GASTRIC BYPASS WITH UPPER ENDOSCOPY;  Surgeon: Johnathan Hausen, MD;  Location: [facility];  Service: General;  Laterality: N/A;  . HERNIA REPAIR    . JOINT REPLACEMENT    . KNEE SURGERY     right  . MOUTH SURGERY    . [place]  . POLYPECTOMY  03/14/2018   Procedure: POLYPECTOMY;  Surgeon: Wilford Corner, MD;  Location: [facility];  Service: Endoscopy;;  .  TOTAL KNEE REVISION  06/21/2012   Procedure: TOTAL KNEE REVISION;  Surgeon: Newt Minion, MD;  Location: [facility];  Service: Orthopedics;  Laterality: Right;  Revision Right Total Knee Arthroplasty    OB History   No obstetric history on file.      Home Medications    Prior to Admission medications   Medication Sig Start Date End Date Taking? Authorizing Provider  morphine (MS CONTIN) 15 MG 12 hr tablet Take 1 tablet (15 mg total) by mouth every 12 (twelve) hours. 11/14/14  Yes Bayard Hugger, NP  albuterol (PROVENTIL HFA;VENTOLIN HFA) 108 (90 BASE) MCG/ACT inhaler Inhale 2 puffs into the lungs every 6 (six) hours as needed for wheezing or shortness of breath. Shortness of breath    [provider]  ALPRAZolam (XANAX) 1 MG tablet Take 1 mg by mouth 4 (four) times daily.  07/02/14   [provider]  amLODipine (NORVASC) 5 MG tablet Take 1 tablet (5 mg total) by mouth daily. 07/05/19   Fulp, Cammie, MD  amphetamine-dextroamphetamine (ADDERALL XR) 30 MG 24 hr capsule Take 30 mg by mouth daily.  03/10/18   [provider]  amphetamine-dextroamphetamine (ADDERALL) 20 MG tablet Take 10 mg by mouth every evening. Pt. Is taking 46m in the evening. 10/13/17   [provider]  Blood Glucose Monitoring Suppl (ACCU-CHEK GUIDE) w/Device KIT 1 kit by Does not apply route 3 (three) times daily. To check blood sugars 07/05/19   Fulp, Cammie, MD  cariprazine (VRAYLAR) capsule 3 mg daily. 4.5 mg daily    [provider]  cephALEXin (KEFLEX) 500 MG capsule Take 1 capsule (500 mg total) by mouth 2 (two) times daily. 06/26/19   MJaynee Eagles PA-C  CHANTIX STARTING MONTH PAK 0.5 MG X 11 & 1 MG X 42 tablet  04/03/19   [provider]  Cyanocobalamin (VITAMIN B 12 PO) Take 1 tablet by mouth daily.     [provider]  cyclobenzaprine (FLEXERIL) 10 MG tablet Take 1/2 to 1 whole tablet by mouth every 8 hours as needed for muscle pain/spasms. 07/26/18   AKaty Apo NP  doxepin (SINEQUAN) 75 MG capsule Take 75 mg by mouth at bedtime. 03/02/18   [provider]  EPINEPHrine 0.3 mg/0.3 mL IJ SOAJ injection Inject 0.3 mg as directed as needed (severe allergic reaction).  01/11/14   [provider]  fluconazole (DIFLUCAN) 200 MG tablet Take 1 tablet (200 mg total) by mouth once a week. 06/26/19   MJaynee Eagles PA-C  glucose blood (ACCU-CHEK GUIDE) test strip Use as  instructed 07/05/19   Fulp, Cammie, MD  levothyroxine (SYNTHROID) 112 MCG tablet Take 1 tablet (112 mcg total) by mouth daily. 07/05/19   Fulp, Cammie, MD  Lidocaine, Anorectal, 5 % GEL Apply 2 g topically 3 (three) times daily as needed. Apply a pea-sized amount to the affected area 3 times daily. 06/26/19   Jaynee Eagles, PA-C  lidocaine-prilocaine (EMLA) cream Apply 1 application topically as needed. 01/29/19   Vanessa Kick, MD  linaclotide (LINZESS) 290 MCG CAPS capsule Take 290 mcg by mouth at bedtime.    [provider]  LYRICA 75 MG capsule Take 1 capsule (75 mg total) by mouth 2 (two) times daily. 07/05/19   Fulp, Cammie, MD  metoCLOPramide (REGLAN) 10 MG tablet Take 1 tablet (10 mg total) by mouth every 8 (eight) hours as needed for nausea. Patient not taking: Reported on 07/05/2019 06/30/18   Mesner, Corene Cornea, MD  Multiple Vitamin (MULTIVITAMIN) tablet Take 1 tablet by mouth every morning.     [provider]  nitroGLYCERIN (NITROSTAT) 0.4 MG SL tablet DISSOLVE 1 TABLET UNDER THE TONGUE EVERY 5 MINUTES AS NEEDED Patient not taking: Reported on 07/05/2019 01/27/16   Belva Crome, MD  olmesartan-hydrochlorothiazide (BENICAR HCT) 20-12.5 MG tablet Take 1 tablet by mouth daily. 07/05/19   Fulp, Cammie, MD  omeprazole (PRILOSEC) 40 MG capsule Take 1 capsule (40 mg total) by mouth daily. 07/05/19   Fulp, Cammie, MD  Oxycodone HCl 10 MG TABS Take 1 tablet (10 mg total) by mouth every 6 (six) hours as needed (for pain). 11/14/14   Bayard Hugger, NP  predniSONE (STERAPRED  UNI-PAK 21 TAB) 10 MG (21) TBPK tablet 6 tabs for 1 day, then 5 tabs for 1 das, then 4 tabs for 1 day, then 3 tabs for 1 day, 2 tabs for 1 day, then 1 tab for 1 day 07/31/19   Loura Halt A, NP  umeclidinium-vilanterol (ANORO ELLIPTA) 62.5-25 MCG/INH AEPB Inhale 1 puff, once daily- maintenance Patient not taking: Reported on 07/05/2019 04/17/19   Deneise Lever, MD  umeclidinium-vilanterol (ANORO ELLIPTA) 62.5-25 MCG/INH AEPB Inhale 1 puff into the lungs daily. 04/17/19   Baird Lyons D, MD  VOLTAREN 1 % GEL Apply 4 g topically 4 (four) times daily. 12/25/17   Tereasa Coop, PA-C  vortioxetine HBr (TRINTELLIX) 10 MG TABS tablet 10 mg daily.    [provider]  Zoster Vaccine Adjuvanted [facility]) injection Shingrix (PF) 50 mcg/0.5 mL intramuscular suspension, kit    [provider]  escitalopram (LEXAPRO) 20 MG tablet Take 20 mg by mouth daily.  12/14/18  [provider]  LATUDA 120 MG TABS Take 120 mg by mouth at bedtime.  03/04/18 12/14/18  [provider]  LISINOPRIL PO Take by mouth daily.    09/06/11  [provider]    Family History Family History  Problem Relation Age of Onset  . Cancer Father        colon  . Stroke Father   . Heart disease Father   . Diabetes Father   . Hypertension Father   . Depression Sister   . Hypertension Mother   . Breast cancer Neg Hx     Social History Social History   Tobacco Use  . Smoking status: Former Smoker    Packs/day: 0.25    Years: 43.00    Pack years: 10.75    Types: Cigarettes  . Smokeless tobacco: Never Used  Substance Use Topics  . Alcohol use: Yes  Comment: social  . Drug use: No     Allergies   Aspirin, Bee venom, Sulfa antibiotics, and Other   Review of Systems Review of Systems  Musculoskeletal: Positive for arthralgias and gait problem. Negative for joint swelling.       Positive for knee and left leg pain  Skin: Negative for color change and wound.  Neurological:  Positive for numbness. Negative for weakness.  All other systems reviewed and are negative.    Physical Exam Triage Vital Signs ED Triage Vitals  Enc Vitals Group     BP 08/06/19 1920 122/83     Pulse Rate 08/06/19 1920 78     Resp 08/06/19 1920 14     Temp 08/06/19 1920 98.4 F (36.9 C)     Temp Source 08/06/19 1920 Oral     SpO2 08/06/19 1920 96 %     Weight --      Height --      Head Circumference --      Peak Flow --      Pain Score 08/06/19 1917 8     Pain Loc --      Pain Edu? --      Excl. in [place]? --    No data found.  Updated Vital Signs BP 122/83 (BP Location: Left Arm)   Pulse 78   Temp 98.4 F (36.9 C) (Oral)   Resp 14   SpO2 96%   Visual Acuity Right Eye Distance:   Left Eye Distance:   Bilateral Distance:    Right Eye Near:   Left Eye Near:    Bilateral Near:     Physical Exam Vitals and nursing note reviewed.  Constitutional:      General: She is not in acute distress.    Appearance: Normal appearance. She is well-developed. She is not ill-appearing.  HENT:     Head: Normocephalic and atraumatic.  Eyes:     Conjunctiva/sclera: Conjunctivae normal.  Cardiovascular:     Rate and Rhythm: Normal rate.  Pulmonary:     Effort: Pulmonary effort is normal. No respiratory distress.  Musculoskeletal:     Cervical back: Neck supple.     Right lower leg: No edema.     Left lower leg: No edema.     Comments: Positive straight leg raise in left leg.  Tenderness to palpation at knee joint.  No effusion noted.  No discoloration or erythema.  Patient able ambulate with cane.  Skin:    General: Skin is warm and dry.     Findings: No bruising, erythema or rash.  Neurological:     General: No focal deficit present.     Mental Status: She is alert and oriented to person, place, and time.  Psychiatric:        Mood and Affect: Mood normal.        Behavior: Behavior normal.        Thought Content: Thought content normal.        Judgment: Judgment normal.        UC Treatments / Results  Labs (all labs ordered are listed, but only abnormal results are displayed) Labs Reviewed - No data to display  EKG   Radiology No results found.  Procedures Procedures (including critical care time)  Medications Ordered in UC Medications  ketorolac (TORADOL) 30 MG/ML injection 30 mg (30 mg Intramuscular Given 08/06/19 2013)    Initial Impression / Assessment and Plan / UC Course  I have reviewed the  triage vital signs and the nursing notes.  Pertinent labs & imaging results that were available during my care of the patient were reviewed by me and considered in my medical decision making (see chart for details).     #Chronic knee pain Patient is a 59 year old patient with chronic pain in numerous locations specifically with complaint today of acute on chronic left knee and leg pain.  Given that she reports other symptoms are at baseline and that Toradol is one of the best treatment she can receive for this, will elect for a 30 mg Toradol injection today.  We will avoid prednisone at this time as she is a patient with diabetes as well.  Discussed that she needs to follow-up with her pain management specialist and her orthopedic provider for a better pain management solution.  Discussed that she should limit the amount of sedating medication she takes at one time. -Tylenol 2 times regular strength every 8 hours for additional pain relief.   Final Clinical Impressions(s) / UC Diagnoses   Final diagnoses:  Chronic pain of left knee     Discharge Instructions     We have given you the Toradol shot today.  I would like for you to follow-up with your pain management orthopedic doctors to discuss changes in your pain management care.  We do not believe it is good for you to continue to have regular steroid medications due to your diabetes.  I would like you to change the way you are taking Tylenol.  You should be taking 2 extra strength Tylenol  every 8 hours.   ED Prescriptions    None     PDMP not reviewed this encounter.   Purnell Shoemaker, PA-C 08/06/19 2102

## 2019-08-06 NOTE — Discharge Instructions (Signed)
We have given you the Toradol shot today.  I would like for you to follow-up with your pain management orthopedic doctors to discuss changes in your pain management care.  We do not believe it is good for you to continue to have regular steroid medications due to your diabetes.  I would like you to change the way you are taking Tylenol.  You should be taking 2 extra strength Tylenol every 8 hours.

## 2019-08-06 NOTE — ED Triage Notes (Signed)
Patient presents to Urgent Care with complaints of acute on chronic exacerbation of left knee/ leg pain since 4 days ago. Patient reports she usually gets a shot of toradol and it gets better.

## 2019-08-08 ENCOUNTER — Other Ambulatory Visit: Payer: Self-pay

## 2019-08-08 ENCOUNTER — Ambulatory Visit: Payer: Medicare HMO | Attending: Family Medicine | Admitting: Physician Assistant

## 2019-08-08 DIAGNOSIS — Z20822 Contact with and (suspected) exposure to covid-19: Secondary | ICD-10-CM | POA: Diagnosis not present

## 2019-08-08 DIAGNOSIS — Z09 Encounter for follow-up examination after completed treatment for conditions other than malignant neoplasm: Secondary | ICD-10-CM

## 2019-08-08 DIAGNOSIS — M792 Neuralgia and neuritis, unspecified: Secondary | ICD-10-CM

## 2019-08-08 MED ORDER — PREGABALIN 100 MG PO CAPS: 100.0000 mg | ORAL_CAPSULE | Freq: Three times a day (TID) | ORAL | 1 refills | Status: DC

## 2019-08-08 NOTE — Progress Notes (Signed)
Need meds refill Lyrica 150mg   Radiating pain from the toe to th buttock on the left site. 8/10 Member in the family tested positive/ denies Symptoms only a runny nose

## 2019-08-08 NOTE — Progress Notes (Signed)
Patient ID: Caroline Williams, female   DOB: 10-22-1960, 59 y.o.   MRN: [id number] Virtual Visit via Telephone Note  I connected with [name] on 08/08/19 at  8:50 AM EST by telephone and verified that I am speaking with the correct person using two identifiers.   I discussed the limitations, risks, security and privacy concerns of performing an evaluation and management service by telephone and the availability of in person appointments. I also discussed with the patient that there may be a patient responsible charge related to this service. The patient expressed understanding and agreed to proceed.  PATIENT visit by telephone virtually in the context of Covid-19 pandemic. Patient location:  home My Location:  [facility] office Persons on the call:  Me and the patient  History of Present Illness: After ED/UC visits on 2/16 and 08/06/2019 for diabetic neuropathy;  L leg.  She was given toradol and prednisone which have not helped.  Has taken lyrica 75 mg bid in the past but does not feel the dose is high enough to help.  Her blood sugars are 90-120.  She also has family members in the house with Covid right now and is quarantining.  She has a runny nose but no other symptoms such as fever/cough/GI s/sx.     Observations/Objective:  NAD.  A&Ox3   Assessment and Plan: 1. Neuropathic pain Increase dose. - pregabalin (LYRICA) 100 MG capsule; Take 1 capsule (100 mg total) by mouth 3 (three) times daily.  Dispense: 90 capsule; Refill: 1   2. Exposure to COVID-19 virus OTC for any s/sx.  To ED if s/sx become severe or develops resp distress.  Quarantine as directed  3. Encounter for examination following treatment at hospital     Follow Up Instructions: See Dr Chapman Fitch in 6 weeks   I discussed the assessment and treatment plan with the patient. The patient was provided an opportunity to ask questions and all were answered. The patient agreed with the plan and demonstrated an understanding of the  instructions.   The patient was advised to call back or seek an in-person evaluation if the symptoms worsen or if the condition fails to improve as anticipated.  I provided 12 minutes of non-face-to-face time during this encounter.   Freeman Caldron, PA-C

## 2019-08-11 ENCOUNTER — Other Ambulatory Visit (HOSPITAL_COMMUNITY): Payer: Medicare HMO

## 2019-08-11 ENCOUNTER — Inpatient Hospital Stay (HOSPITAL_COMMUNITY): Admission: RE | Admit: 2019-08-11 | Payer: Medicare HMO | Source: Ambulatory Visit

## 2019-08-15 ENCOUNTER — Ambulatory Visit: Payer: Medicare HMO | Admitting: Internal Medicine

## 2019-08-25 NOTE — Progress Notes (Signed)
Cardiology Office Note:    Date:  08/27/2019   ID:  Caroline Williams, DOB Jun 28, 1960, MRN [id number]  PCP:  Antony Blackbird, MD  Cardiologist:  No primary care provider on file.   Referring MD: Antony Blackbird, MD   Chief Complaint  Patient presents with  . Chest Pain  . Hyperlipidemia  . Hypertension  . Advice Only    Diabetes mellitus    History of Present Illness:    Caroline Williams is a 59 y.o. female with a hx of DM II, hypertension, COPD, tobacco, and hyperlipidemia. Here for cardiology consultation cardiology risk prevention and chest pain..   Over the past 4 to 6 weeks the patient has experienced recurring episodes of sternal (lower) discomfort that radiates into the left arm.  She describes it as a tingling sensation that spreads from her chest into the left arm.  Episodes last 5 to 10 minutes.  Episodes are gradual in onset and resolve gradually.  There is some diaphoresis and mild dyspnea.  There is a family history of CAD, both mother and father had stents.  Father died of an MI.  Patient is a prior smoker but discontinued 8 months ago.  Smoked greater than 30 years.  She is nondiabetic, has history of hypertension and hyperlipidemia.  The patient has a history of hyperlipidemia.  Has apparently been on statin therapy in the past but was unable to tolerate.  Under the supervision of Dr. Rickey Barbara, she was placed on Repatha.  More recently she switched from Dr. Rozann Lesches to Dr. Chapman Fitch who referred her to cardiology for continuation of Repatha therapy.  Past Medical History:  Diagnosis Date  . Anxiety   . Arthritis   . Asthma   . Bipolar 1 disorder ([place])   . Breast discharge   . Breast lump   . Breast pain   . Chronic pain    on MS Contin and oxycodone  . Depression   . Diabetes mellitus    diet and exercise controlled  . Escherichia coli (E. coli) infection   . Fever   . GERD (gastroesophageal reflux disease)   . History of chest pain   . History of kidney stones   .  History of knee replacement, total   . Hypertension   . Hypothyroidism   . N&V (nausea and vomiting)   . Peripheral neuropathy    Right foot no sensation  . Pneumonia    2015,2014  . Sleep apnea    does not use CPAP  . Thyroid disease   . Wears glasses     Past Surgical History:  Procedure Laterality Date  . ABDOMINAL HYSTERECTOMY     partial  . APPENDECTOMY    . BIOPSY  03/14/2018   Procedure: BIOPSY;  Surgeon: Wilford Corner, MD;  Location: [facility];  Service: Endoscopy;;  . BREAST DUCTAL SYSTEM EXCISION Right 08/31/2017   Procedure: RIGHT BREAST CENTRAL DUCT EXCISION;  Surgeon: Erroll Luna, MD;  Location: [facility];  Service: General;  Laterality: Right;  . BREAST EXCISIONAL BIOPSY Right   . BREAST LUMPECTOMY     right  . CARDIAC CATHETERIZATION     no significant CAD, nl LV function by 11/08/06 cath  . CESAREAN SECTION     x4  . COLONOSCOPY WITH PROPOFOL N/A 03/14/2018   Procedure: COLONOSCOPY WITH PROPOFOL;  Surgeon: Wilford Corner, MD;  Location: [facility];  Service: Endoscopy;  Laterality: N/A;  . GASTRIC ROUX-EN-Y N/A 11/25/2014   Procedure: LAPAROSCOPIC ROUX-EN-Y  GASTRIC BYPASS WITH UPPER ENDOSCOPY;  Surgeon: Johnathan Hausen, MD;  Location: [facility];  Service: General;  Laterality: N/A;  . HERNIA REPAIR    . JOINT REPLACEMENT    . KNEE SURGERY     right  . MOUTH SURGERY    . [place]  . POLYPECTOMY  03/14/2018   Procedure: POLYPECTOMY;  Surgeon: Wilford Corner, MD;  Location: [facility];  Service: Endoscopy;;  . TOTAL KNEE REVISION  06/21/2012   Procedure: TOTAL KNEE REVISION;  Surgeon: Newt Minion, MD;  Location: [facility];  Service: Orthopedics;  Laterality: Right;  Revision Right Total Knee Arthroplasty    Current Medications: Current Meds  Medication Sig  . albuterol (PROVENTIL HFA;VENTOLIN HFA) 108 (90 BASE) MCG/ACT inhaler Inhale 2 puffs into the lungs every 6 (six) hours as needed for wheezing or  shortness of breath. Shortness of breath  . ALPRAZolam (XANAX) 1 MG tablet Take 1 mg by mouth 4 (four) times daily.   Marland Kitchen amLODipine (NORVASC) 5 MG tablet Take 1 tablet (5 mg total) by mouth daily.  Marland Kitchen amphetamine-dextroamphetamine (ADDERALL XR) 30 MG 24 hr capsule Take 30 mg by mouth daily.   Marland Kitchen amphetamine-dextroamphetamine (ADDERALL) 20 MG tablet Take 10 mg by mouth every evening. Pt. Is taking 30m in the evening.  . Blood Glucose Monitoring Suppl (ACCU-CHEK GUIDE) w/Device KIT 1 kit by Does not apply route 3 (three) times daily. To check blood sugars  . cariprazine (VRAYLAR) capsule 3 mg daily. 4.5 mg daily  . CHANTIX STARTING MONTH PAK 0.5 MG X 11 & 1 MG X 42 tablet   . Cyanocobalamin (VITAMIN B 12 PO) Take 1 tablet by mouth daily.   . cyclobenzaprine (FLEXERIL) 10 MG tablet Take 1/2 to 1 whole tablet by mouth every 8 hours as needed for muscle pain/spasms.  .Marland Kitchendoxepin (SINEQUAN) 75 MG capsule Take 75 mg by mouth at bedtime.  .Marland KitchenEPINEPHrine 0.3 mg/0.3 mL IJ SOAJ injection Inject 0.3 mg as directed as needed (severe allergic reaction).   .Marland Kitchenglucose blood (ACCU-CHEK GUIDE) test strip Use as instructed  . levothyroxine (SYNTHROID) 112 MCG tablet Take 1 tablet (112 mcg total) by mouth daily.  .Marland Kitchenlidocaine-prilocaine (EMLA) cream Apply 1 application topically as needed.  . linaclotide (LINZESS) 290 MCG CAPS capsule Take 290 mcg by mouth at bedtime.  . metoCLOPramide (REGLAN) 10 MG tablet Take 1 tablet (10 mg total) by mouth every 8 (eight) hours as needed for nausea.  .Marland Kitchenmorphine (MS CONTIN) 15 MG 12 hr tablet Take 1 tablet (15 mg total) by mouth every 12 (twelve) hours.  . Multiple Vitamin (MULTIVITAMIN) tablet Take 1 tablet by mouth every morning.   . nabumetone (RELAFEN) 750 MG tablet Take 750 mg by mouth 2 (two) times daily.  . nitroGLYCERIN (NITROSTAT) 0.4 MG SL tablet DISSOLVE 1 TABLET UNDER THE TONGUE EVERY 5 MINUTES AS NEEDED  . olmesartan-hydrochlorothiazide (BENICAR HCT) 20-12.5 MG tablet Take  1 tablet by mouth daily.  .Marland Kitchenomeprazole (PRILOSEC) 40 MG capsule Take 1 capsule (40 mg total) by mouth daily.  . Oxycodone HCl 10 MG TABS Take 1 tablet (10 mg total) by mouth every 6 (six) hours as needed (for pain).  . pregabalin (LYRICA) 100 MG capsule Take 1 capsule (100 mg total) by mouth 3 (three) times daily.  .Marland KitchentiZANidine (ZANAFLEX) 2 MG tablet Take 2 mg by mouth every 8 (eight) hours as needed.  . umeclidinium-vilanterol (ANORO ELLIPTA) 62.5-25 MCG/INH AEPB Inhale 1 puff into the lungs  daily.  . VOLTAREN 1 % GEL Apply 4 g topically 4 (four) times daily.  Marland Kitchen vortioxetine HBr (TRINTELLIX) 10 MG TABS tablet 10 mg daily.     Allergies:   Aspirin, Bee venom, Sulfa antibiotics, and Other   Social History   Socioeconomic History  . Marital status: Single    Spouse name: Not on file  . Number of children: Not on file  . Years of education: Not on file  . Highest education level: Not on file  Occupational History  . Not on file  Tobacco Use  . Smoking status: Former Smoker    Packs/day: 0.25    Years: 43.00    Pack years: 10.75    Types: Cigarettes    Quit date: 2020    Years since quitting: 1.2  . Smokeless tobacco: Never Used  Substance and Sexual Activity  . Alcohol use: Yes    Comment: social  . Drug use: No  . Sexual activity: Yes    Birth control/protection: Surgical  Other Topics Concern  . Not on file  Social History Narrative  . Not on file   Social Determinants of Health   Financial Resource Strain:   . Difficulty of Paying Living Expenses:   Food Insecurity:   . Worried About Charity fundraiser in the Last Year:   . Arboriculturist in the Last Year:   Transportation Needs:   . Film/video editor (Medical):   Marland Kitchen Lack of Transportation (Non-Medical):   Physical Activity:   . Days of Exercise per Week:   . Minutes of Exercise per Session:   Stress:   . Feeling of Stress :   Social Connections:   . Frequency of Communication with Friends and Family:    . Frequency of Social Gatherings with Friends and Family:   . Attends Religious Services:   . Active Member of Clubs or Organizations:   . Attends Archivist Meetings:   Marland Kitchen Marital Status:      Family History: The patient's family history includes Cancer in her father; Depression in her sister; Diabetes in her father; Heart disease in her father; Hypertension in her father and mother; Stroke in her father. There is no history of Breast cancer.  ROS:   Please see the history of present illness.    She has insomnia.  She had a previous diagnosis of sleep apnea but states she was told by Dr. Baird Lyons that she no longer has it.  She has anxiety.  She has arthritis.  She has difficulty ambulating and exercising.  She snores when she sleeps.  All other systems reviewed and are negative.  EKGs/Labs/Other Studies Reviewed:    The following studies were reviewed today: Nuclear myocardial perfusion study 12/08/2016: Study Highlights    Nuclear stress EF: 60%.  There was no ST segment deviation noted during stress.  The study is normal.  The left ventricular ejection fraction is normal (55-65%).   1. EF 60%, normal wall motion.  2. No evidence for ischemia or infarction by perfusion images.  3. There is a left breast nodule noted.  Patient should have this evaluated by mammogram.      EKG:  EKG poor R wave progression V1 through V4.  Normal sinus rhythm.  Since the last tracing the axis is now normal rather than leftward.  Recent Labs: 07/06/2019: ALT 16; BUN 9; Creatinine, Ser 1.05; Hemoglobin 14.2; Platelets 277; Potassium 4.0; Sodium 143; TSH 14.600  Recent Lipid  Panel    Component Value Date/Time   CHOL 213 (H) 07/06/2019 0850   TRIG 100 07/06/2019 0850   HDL 74 07/06/2019 0850   CHOLHDL 2.9 07/06/2019 0850   LDLCALC 121 (H) 07/06/2019 0850    Physical Exam:    VS:  BP 132/72   Pulse 84   Ht 5' 5" (1.651 m)   Wt 252 lb (114.3 kg)   SpO2 98%   BMI 41.93  kg/m     Wt Readings from Last 3 Encounters:  08/27/19 252 lb (114.3 kg)  07/31/19 240 lb (108.9 kg)  04/17/19 243 lb 3.2 oz (110.3 kg)     GEN: Morbid obesity. No acute distress HEENT: Normal NECK: No JVD. LYMPHATICS: No lymphadenopathy CARDIAC:  RRR without murmur, gallop, or edema. VASCULAR:  Normal Pulses. No bruits. RESPIRATORY:  Clear to auscultation without rales, wheezing or rhonchi  ABDOMEN: Soft, non-tender, non-distended, No pulsatile mass, MUSCULOSKELETAL: No deformity  SKIN: Warm and dry NEUROLOGIC:  Alert and oriented x 3 PSYCHIATRIC:  Normal affect   ASSESSMENT:    1. Chest pain of uncertain etiology   2. Hyperlipidemia LDL goal <70   3. Type 2 diabetes mellitus with diabetic neuropathy, unspecified whether long term insulin use ([place])   4. Morbid obesity ([place])   5. COPD mixed type ([place])   6. Tobacco user   7. Educated about COVID-19 virus infection   8. Precordial pain    PLAN:    In order of problems listed above:  1. Patient has multiple risk factors and a family history of premature atherosclerosis.  Current chest pain has atypical features but does occur predominantly with exercise and radiates into the left arm.  She had a negative nuclear study 3 years ago.  It would be prudent for her to undergo coronary CTA with morphology and physiology if indicated.  This will give a better understanding of her burden of atherosclerosis given the family history. 2. LDL target less than 70.  Statin intolerant according to history.  Resume Repatha via the lipid clinic. 3. Hemoglobin A1c most recently was 5.9 in January.  Continue current therapy. 4. Decrease caloric intake and exercise 5. Not discussed 6. Tobacco use has been discontinued  Overall education and awareness concerning primary risk prevention was discussed in detail: LDL less than 70, hemoglobin A1c less than 7, blood pressure target less than 130/80 mmHg, >150 minutes of moderate aerobic activity per  week, avoidance of smoking, weight control (via diet and exercise), and continued surveillance/management of/for obstructive sleep apnea.    Medication Adjustments/Labs and Tests Ordered: Current medicines are reviewed at length with the patient today.  Concerns regarding medicines are outlined above.  Orders Placed This Encounter  Procedures  . CT CORONARY MORPH W/CTA COR W/SCORE W/CA W/CM &/OR WO/CM  . CT CORONARY FRACTIONAL FLOW RESERVE DATA PREP  . CT CORONARY FRACTIONAL FLOW RESERVE FLUID ANALYSIS  . Basic metabolic panel  . AMB Referral to [facility]  . EKG 12-Lead   Meds ordered this encounter  Medications  . metoprolol tartrate (LOPRESSOR) 100 MG tablet    Sig: Take one tablet by mouth 2 hours prior to CT    Dispense:  1 tablet    Refill:  0    Patient Instructions  Medication Instructions:  Your physician recommends that you continue on your current medications as directed. Please refer to the Current Medication list given to you today.  *If you need a refill on your cardiac medications  before your next appointment, please call your pharmacy*   Lab Work: None If you have labs (blood work) drawn today and your tests are completely normal, you will receive your results only by: Marland Kitchen MyChart Message (if you have MyChart) OR . A paper copy in the mail If you have any lab test that is abnormal or we need to change your treatment, we will call you to review the results.   Testing/Procedures: Your physician recommends that you have a Coronary CT.   Follow-Up: At [facility], you and your health needs are our priority.  As part of our continuing mission to provide you with exceptional heart care, we have created designated Provider Care Teams.  These Care Teams include your primary Cardiologist (physician) and Advanced Practice Providers (APPs -  Physician Assistants and Nurse Practitioners) who all work together to provide you with the care you need,  when you need it.  We recommend signing up for the patient portal called "MyChart".  Sign up information is provided on this After Visit Summary.  MyChart is used to connect with patients for Virtual Visits (Telemedicine).  Patients are able to view lab/test results, encounter notes, upcoming appointments, etc.  Non-urgent messages can be sent to your provider as well.   To learn more about what you can do with MyChart, go to [email or web address].    Your next appointment:   6 month(s)  The format for your next appointment:   In Person  Provider:   You may see Dr. Daneen Schick or one of the following Advanced Practice Providers on your designated Care Team:    Truitt Merle, NP  Cecilie Kicks, NP  Kathyrn Drown, NP    Other Instructions  Your cardiac CT will be scheduled at one of the below locations:   [facility] [address] [place], [place] [address] [phone]  [facility] [address] [place], [place] [address] [phone]  If scheduled at [facility], please arrive at the [facility] main entrance of [facility] 30 minutes prior to test start time. Proceed to the [facility] Radiology Department (first floor) to check-in and test prep.  If scheduled at [facility], please arrive 15 mins early for check-in and test prep.  Please follow these instructions carefully (unless otherwise directed):  Hold all erectile dysfunction medications at least 3 days (72 hrs) prior to test.  On the Night Before the Test: . Be sure to Drink plenty of water. . Do not consume any caffeinated/decaffeinated beverages or chocolate 12 hours prior to your test. . Do not take any antihistamines 12 hours prior to your test.  On the Day of the Test: . Drink plenty of water. Do not drink any water within one hour of the test. . Do not eat any food 4 hours prior to the  test. . You may take your regular medications prior to the test.  . Take metoprolol (Lopressor) two hours prior to test. . HOLD Olmesartan-Hydrochlorothiazide morning of the test. . FEMALES- please wear underwire-free bra if available       After the Test: . Drink plenty of water. . After receiving IV contrast, you may experience a mild flushed feeling. This is normal. . On occasion, you may experience a mild rash up to 24 hours after the test. This is not dangerous. If this occurs, you can take Benadryl 25 mg and increase your fluid intake. . If  you experience trouble breathing, this can be serious. If it is severe call 911 IMMEDIATELY. If it is mild, please call our office. . If you take any of these medications: Glipizide/Metformin, Avandament, Glucavance, please do not take 48 hours after completing test unless otherwise instructed.   Once we have confirmed authorization from your insurance company, we will call you to set up a date and time for your test.   For non-scheduling related questions, please contact the cardiac imaging nurse navigator should you have any questions/concerns: Marchia Bond, RN Navigator Cardiac Imaging [name] [phone] office  For scheduling needs, including cancellations and rescheduling, please call [phone].        Signed, Sinclair Grooms, MD  08/27/2019 3:01 PM    [place]

## 2019-08-26 ENCOUNTER — Other Ambulatory Visit: Payer: Self-pay

## 2019-08-26 ENCOUNTER — Encounter (HOSPITAL_COMMUNITY): Payer: Self-pay

## 2019-08-26 ENCOUNTER — Ambulatory Visit (HOSPITAL_COMMUNITY)
Admission: EM | Admit: 2019-08-26 | Discharge: 2019-08-26 | Disposition: A | Discharge status: Home / Self Care | Payer: Medicare HMO | Attending: Family Medicine | Admitting: Family Medicine

## 2019-08-26 DIAGNOSIS — G8929 Other chronic pain: Secondary | ICD-10-CM

## 2019-08-26 DIAGNOSIS — M25561 Pain in right knee: Secondary | ICD-10-CM | POA: Diagnosis not present

## 2019-08-26 MED ORDER — KETOROLAC TROMETHAMINE 30 MG/ML IJ SOLN
30.0000 mg | Freq: Once | INTRAMUSCULAR | Status: AC
  Administered 2019-08-26: 30 mg via INTRAMUSCULAR

## 2019-08-26 MED ORDER — KETOROLAC TROMETHAMINE 30 MG/ML IJ SOLN
INTRAMUSCULAR | Status: AC
  Filled 2019-08-26: qty 1

## 2019-08-26 NOTE — Discharge Instructions (Addendum)
Call your pain doctor about breakthrough pain management

## 2019-08-26 NOTE — ED Triage Notes (Signed)
Pt present both knees and joint pain, symptoms started on Friday. Pt is having difficulty walking because of the pain

## 2019-08-26 NOTE — ED Provider Notes (Signed)
[place]    CSN: [id number] Arrival date & time: 08/26/19  1021      History   Chief Complaint Chief Complaint  Patient presents with  . Knee Pain  . Joint Pain    HPI Caroline Williams is a 59 y.o. female.   HPI  Patient is here for bilateral knee pain.  She states her knee pain is "severe".  She states she is unable to walk comfortably because of the pain.  She did drive here to the office. She is under the care of pain specialty.  She is on MS Contin, oxycodone for breakthrough pain She has a history of diabetes and diabetes neuropathy.  For this she takes Lyrica She has mental health disorder and takes amphetamine, alprazolam, Trintellix, and Sinequan She has been here 2 times in the last month and a half.  Each time she receives Toradol injection and/or prednisone.  She states the prednisone did not help her.  She feels the Toradol does  I explained to the patient with chronic pain, she needs to have pain medication for her daily pain as well as pain management for breakthrough pain.  This is an issue she needs to take up with her pain provider.  She should not need to come to the urgent care center twice a month for Toradol injections She does have a history of aspirin allergy, s states anaphylaxis.  Has tolerated Toradol many times in the past without difficulty   Past Medical History:  Diagnosis Date  . Anxiety   . Arthritis   . Asthma   . Bipolar 1 disorder ([place])   . Breast discharge   . Breast lump   . Breast pain   . Chronic pain    on MS Contin and oxycodone  . Depression   . Diabetes mellitus    diet and exercise controlled  . Escherichia coli (E. coli) infection   . Fever   . GERD (gastroesophageal reflux disease)   . History of chest pain   . History of kidney stones   . History of knee replacement, total   . Hypertension   . Hypothyroidism   . N&V (nausea and vomiting)   . Peripheral neuropathy    Right foot no sensation  . Pneumonia     2015,2014  . Sleep apnea    does not use CPAP  . Thyroid disease   . Wears glasses     Patient Active Problem List   Diagnosis Date Noted  . COPD mixed type ([place]) 04/17/2019  . Tobacco user 04/17/2019  . Nocturnal hypoxemia 12/25/2018  . Change in bowel habits 03/14/2018  . Chest pain 01/26/2016  . Hallucination, drug-induced ([place]) 03/12/2015  . Hypothyroidism 03/12/2015  . Lap gastric bypass June 2016 11/25/2014  . Diabetes mellitus with neuropathy ([place]) 06/20/2013  . Chronic pain of right knee 06/20/2013  . Morbid obesity ([place]) 07/05/2012    Past Surgical History:  Procedure Laterality Date  . ABDOMINAL HYSTERECTOMY     partial  . APPENDECTOMY    . BIOPSY  03/14/2018   Procedure: BIOPSY;  Surgeon: Wilford Corner, MD;  Location: [facility];  Service: Endoscopy;;  . BREAST DUCTAL SYSTEM EXCISION Right 08/31/2017   Procedure: RIGHT BREAST CENTRAL DUCT EXCISION;  Surgeon: Erroll Luna, MD;  Location: [facility];  Service: General;  Laterality: Right;  . BREAST EXCISIONAL BIOPSY Right   . BREAST LUMPECTOMY     right  . CARDIAC CATHETERIZATION  no significant CAD, nl LV function by 11/08/06 cath  . CESAREAN SECTION     x4  . COLONOSCOPY WITH PROPOFOL N/A 03/14/2018   Procedure: COLONOSCOPY WITH PROPOFOL;  Surgeon: Wilford Corner, MD;  Location: [facility];  Service: Endoscopy;  Laterality: N/A;  . GASTRIC ROUX-EN-Y N/A 11/25/2014   Procedure: LAPAROSCOPIC ROUX-EN-Y GASTRIC BYPASS WITH UPPER ENDOSCOPY;  Surgeon: Johnathan Hausen, MD;  Location: [facility];  Service: General;  Laterality: N/A;  . HERNIA REPAIR    . JOINT REPLACEMENT    . KNEE SURGERY     right  . MOUTH SURGERY    . [place]  . POLYPECTOMY  03/14/2018   Procedure: POLYPECTOMY;  Surgeon: Wilford Corner, MD;  Location: [facility];  Service: Endoscopy;;  . TOTAL KNEE REVISION  06/21/2012   Procedure: TOTAL KNEE REVISION;  Surgeon: Newt Minion, MD;   Location: [facility];  Service: Orthopedics;  Laterality: Right;  Revision Right Total Knee Arthroplasty    OB History   No obstetric history on file.      Home Medications    Prior to Admission medications   Medication Sig Start Date End Date Taking? Authorizing Provider  albuterol (PROVENTIL HFA;VENTOLIN HFA) 108 (90 BASE) MCG/ACT inhaler Inhale 2 puffs into the lungs every 6 (six) hours as needed for wheezing or shortness of breath. Shortness of breath    [provider]  ALPRAZolam (XANAX) 1 MG tablet Take 1 mg by mouth 4 (four) times daily.  07/02/14   [provider]  amLODipine (NORVASC) 5 MG tablet Take 1 tablet (5 mg total) by mouth daily. 07/05/19   Fulp, Cammie, MD  amphetamine-dextroamphetamine (ADDERALL XR) 30 MG 24 hr capsule Take 30 mg by mouth daily.  03/10/18   [provider]  amphetamine-dextroamphetamine (ADDERALL) 20 MG tablet Take 10 mg by mouth every evening. Pt. Is taking '10mg'$  in the evening. 10/13/17   [provider]  Blood Glucose Monitoring Suppl (ACCU-CHEK GUIDE) w/Device KIT 1 kit by Does not apply route 3 (three) times daily. To check blood sugars 07/05/19   Fulp, Cammie, MD  cariprazine (VRAYLAR) capsule 3 mg daily. 4.5 mg daily    [provider]  cephALEXin (KEFLEX) 500 MG capsule Take 1 capsule (500 mg total) by mouth 2 (two) times daily. 06/26/19   Jaynee Eagles, PA-C  CHANTIX STARTING MONTH PAK 0.5 MG X 11 & 1 MG X 42 tablet  04/03/19   [provider]  Cyanocobalamin (VITAMIN B 12 PO) Take 1 tablet by mouth daily.     [provider]  cyclobenzaprine (FLEXERIL) 10 MG tablet Take 1/2 to 1 whole tablet by mouth every 8 hours as needed for muscle pain/spasms. 07/26/18   Katy Apo, NP  doxepin (SINEQUAN) 75 MG capsule Take 75 mg by mouth at bedtime. 03/02/18   [provider]  EPINEPHrine 0.3 mg/0.3 mL IJ SOAJ injection Inject 0.3 mg as directed as needed (severe allergic reaction).  01/11/14    [provider]  fluconazole (DIFLUCAN) 200 MG tablet Take 1 tablet (200 mg total) by mouth once a week. 06/26/19   Jaynee Eagles, PA-C  glucose blood (ACCU-CHEK GUIDE) test strip Use as instructed 07/05/19   Fulp, Cammie, MD  levothyroxine (SYNTHROID) 112 MCG tablet Take 1 tablet (112 mcg total) by mouth daily. 07/05/19   Fulp, Cammie, MD  Lidocaine, Anorectal, 5 % GEL Apply 2 g topically 3 (three) times daily as needed. Apply a pea-sized amount to the  affected area 3 times daily. 06/26/19   Jaynee Eagles, PA-C  lidocaine-prilocaine (EMLA) cream Apply 1 application topically as needed. 01/29/19   Vanessa Kick, MD  linaclotide (LINZESS) 290 MCG CAPS capsule Take 290 mcg by mouth at bedtime.    [provider]  metoCLOPramide (REGLAN) 10 MG tablet Take 1 tablet (10 mg total) by mouth every 8 (eight) hours as needed for nausea. Patient not taking: Reported on 07/05/2019 06/30/18   Mesner, Corene Cornea, MD  morphine (MS CONTIN) 15 MG 12 hr tablet Take 1 tablet (15 mg total) by mouth every 12 (twelve) hours. 11/14/14   Bayard Hugger, NP  Multiple Vitamin (MULTIVITAMIN) tablet Take 1 tablet by mouth every morning.     [provider]  nitroGLYCERIN (NITROSTAT) 0.4 MG SL tablet DISSOLVE 1 TABLET UNDER THE TONGUE EVERY 5 MINUTES AS NEEDED Patient not taking: Reported on 07/05/2019 01/27/16   Belva Crome, MD  olmesartan-hydrochlorothiazide (BENICAR HCT) 20-12.5 MG tablet Take 1 tablet by mouth daily. 07/05/19   Fulp, Cammie, MD  omeprazole (PRILOSEC) 40 MG capsule Take 1 capsule (40 mg total) by mouth daily. 07/05/19   Fulp, Cammie, MD  Oxycodone HCl 10 MG TABS Take 1 tablet (10 mg total) by mouth every 6 (six) hours as needed (for pain). 11/14/14   Bayard Hugger, NP  pregabalin (LYRICA) 100 MG capsule Take 1 capsule (100 mg total) by mouth 3 (three) times daily. 08/08/19   Argentina Donovan, PA-C  umeclidinium-vilanterol (ANORO ELLIPTA) 62.5-25 MCG/INH AEPB Inhale 1 puff into the lungs daily.  04/17/19   Baird Lyons D, MD  VOLTAREN 1 % GEL Apply 4 g topically 4 (four) times daily. 12/25/17   Tereasa Coop, PA-C  vortioxetine HBr (TRINTELLIX) 10 MG TABS tablet 10 mg daily.    [provider]  escitalopram (LEXAPRO) 20 MG tablet Take 20 mg by mouth daily.  12/14/18  [provider]  LATUDA 120 MG TABS Take 120 mg by mouth at bedtime.  03/04/18 12/14/18  [provider]  LISINOPRIL PO Take by mouth daily.    09/06/11  [provider]    Family History Family History  Problem Relation Age of Onset  . Cancer Father        colon  . Stroke Father   . Heart disease Father   . Diabetes Father   . Hypertension Father   . Depression Sister   . Hypertension Mother   . Breast cancer Neg Hx     Social History Social History   Tobacco Use  . Smoking status: Former Smoker    Packs/day: 0.25    Years: 43.00    Pack years: 10.75    Types: Cigarettes  . Smokeless tobacco: Never Used  Substance Use Topics  . Alcohol use: Yes    Comment: social  . Drug use: No     Allergies   Aspirin, Bee venom, Sulfa antibiotics, and Other   Review of Systems Review of Systems  Musculoskeletal: Positive for arthralgias and joint swelling.     Physical Exam Triage Vital Signs ED Triage Vitals  Enc Vitals Group     BP 08/26/19 1134 (!) 118/59     Pulse Rate 08/26/19 1134 74     Resp 08/26/19 1134 16     Temp 08/26/19 1134 98.4 F (36.9 C)     Temp Source 08/26/19 1134 Oral     SpO2 08/26/19 1134 100 %     Weight --  Height --      Head Circumference --      Peak Flow --      Pain Score 08/26/19 1132 9     Pain Loc --      Pain Edu? --      Excl. in [place]? --    No data found.  Updated Vital Signs BP (!) 118/59 (BP Location: Right Arm)   Pulse 74   Temp 98.4 F (36.9 C) (Oral)   Resp 16   SpO2 100%     Physical Exam Constitutional:      General: She is not in acute distress.    Appearance: She is well-developed. She is obese.      Comments: Appears slightly sedated  HENT:     Head: Normocephalic and atraumatic.  Eyes:     Conjunctiva/sclera: Conjunctivae normal.     Pupils: Pupils are equal, round, and reactive to light.  Cardiovascular:     Rate and Rhythm: Normal rate.  Pulmonary:     Effort: Pulmonary effort is normal. No respiratory distress.  Abdominal:     General: There is no distension.     Palpations: Abdomen is soft.  Musculoskeletal:        General: Normal range of motion.     Cervical back: Normal range of motion.     Comments: Knees have warmth and crepitus with no appreciable effusion  Skin:    General: Skin is warm and dry.  Neurological:     Mental Status: She is alert.  Psychiatric:     Comments: Mild hesitancy/slurring of speech      UC Treatments / Results  Labs (all labs ordered are listed, but only abnormal results are displayed) Labs Reviewed - No data to display  EKG   Radiology No results found.  Procedures Procedures (including critical care time)  Medications Ordered in UC Medications  ketorolac (TORADOL) 30 MG/ML injection 30 mg (has no administration in time range)    Initial Impression / Assessment and Plan / UC Course  I have reviewed the triage vital signs and the nursing notes.  Pertinent labs & imaging results that were available during my care of the patient were reviewed by me and considered in my medical decision making (see chart for details).     The patient clearly has been taking her Ativan/opiates/sedating medications.  I did tell her I am concerned about her driving.  She states that she drives while on her medication out of necessity.  She lives close by.  She will go straight home after her Toradol injection. Final Clinical Impressions(s) / UC Diagnoses   Final diagnoses:  Chronic pain of right knee  Morbid obesity [facility])     Discharge Instructions     Call your pain doctor about breakthrough pain management   ED Prescriptions     None     I have reviewed the PDMP during this encounter.   Raylene Everts, MD 08/26/19 1214

## 2019-08-27 ENCOUNTER — Ambulatory Visit (INDEPENDENT_AMBULATORY_CARE_PROVIDER_SITE_OTHER): Payer: Medicare HMO | Admitting: Interventional Cardiology

## 2019-08-27 ENCOUNTER — Encounter: Payer: Self-pay | Admitting: Interventional Cardiology

## 2019-08-27 VITALS — BP 132/72 | HR 84 | Ht 65.0 in | Wt 252.0 lb

## 2019-08-27 DIAGNOSIS — R079 Chest pain, unspecified: Secondary | ICD-10-CM | POA: Diagnosis not present

## 2019-08-27 DIAGNOSIS — E1140 Type 2 diabetes mellitus with diabetic neuropathy, unspecified: Secondary | ICD-10-CM

## 2019-08-27 DIAGNOSIS — Z7189 Other specified counseling: Secondary | ICD-10-CM

## 2019-08-27 DIAGNOSIS — R072 Precordial pain: Secondary | ICD-10-CM

## 2019-08-27 DIAGNOSIS — E785 Hyperlipidemia, unspecified: Secondary | ICD-10-CM | POA: Diagnosis not present

## 2019-08-27 DIAGNOSIS — J449 Chronic obstructive pulmonary disease, unspecified: Secondary | ICD-10-CM

## 2019-08-27 DIAGNOSIS — Z720 Tobacco use: Secondary | ICD-10-CM

## 2019-08-27 MED ORDER — METOPROLOL TARTRATE 100 MG PO TABS: ORAL_TABLET | ORAL | 0 refills | Status: DC

## 2019-08-27 NOTE — Patient Instructions (Addendum)
Medication Instructions:  Your physician recommends that you continue on your current medications as directed. Please refer to the Current Medication list given to you today.  *If you need a refill on your cardiac medications before your next appointment, please call your pharmacy*   Lab Work: None If you have labs (blood work) drawn today and your tests are completely normal, you will receive your results only by: Marland Kitchen MyChart Message (if you have MyChart) OR . A paper copy in the mail If you have any lab test that is abnormal or we need to change your treatment, we will call you to review the results.   Testing/Procedures: Your physician recommends that you have a Coronary CT.   Follow-Up: At [facility], you and your health needs are our priority.  As part of our continuing mission to provide you with exceptional heart care, we have created designated Provider Care Teams.  These Care Teams include your primary Cardiologist (physician) and Advanced Practice Providers (APPs -  Physician Assistants and Nurse Practitioners) who all work together to provide you with the care you need, when you need it.  We recommend signing up for the patient portal called "MyChart".  Sign up information is provided on this After Visit Summary.  MyChart is used to connect with patients for Virtual Visits (Telemedicine).  Patients are able to view lab/test results, encounter notes, upcoming appointments, etc.  Non-urgent messages can be sent to your provider as well.   To learn more about what you can do with MyChart, go to [email or web address].    Your next appointment:   6 month(s)  The format for your next appointment:   In Person  Provider:   You may see Dr. Daneen Schick or one of the following Advanced Practice Providers on your designated Care Team:    Truitt Merle, NP  Cecilie Kicks, NP  Kathyrn Drown, NP    Other Instructions  Your cardiac CT will be scheduled at one of the below  locations:   [facility] [address] [place], [place] [address] [phone]  [place] [address] [place], [place] [address] [phone]  If scheduled at [facility], please arrive at the [facility] main entrance of [facility] 30 minutes prior to test start time. Proceed to the [facility] Radiology Department (first floor) to check-in and test prep.  If scheduled at [facility], please arrive 15 mins early for check-in and test prep.  Please follow these instructions carefully (unless otherwise directed):  Hold all erectile dysfunction medications at least 3 days (72 hrs) prior to test.  On the Night Before the Test: . Be sure to Drink plenty of water. . Do not consume any caffeinated/decaffeinated beverages or chocolate 12 hours prior to your test. . Do not take any antihistamines 12 hours prior to your test.  On the Day of the Test: . Drink plenty of water. Do not drink any water within one hour of the test. . Do not eat any food 4 hours prior to the test. . You may take your regular medications prior to the test.  . Take metoprolol (Lopressor) two hours prior to test. . HOLD Olmesartan-Hydrochlorothiazide morning of the test. . FEMALES- please wear underwire-free bra if available       After the Test: . Drink plenty of water. . After receiving IV contrast, you may experience a mild flushed feeling. This is normal. .  On occasion, you may experience a mild rash up to 24 hours after the test. This is not dangerous. If this occurs, you can take Benadryl 25 mg and increase your fluid intake. . If you experience trouble breathing, this can be serious. If it is severe call 911 IMMEDIATELY. If it is mild, please call our office. . If you take any of these medications: Glipizide/Metformin, Avandament, Glucavance, please do not take 48 hours after completing test  unless otherwise instructed.   Once we have confirmed authorization from your insurance company, we will call you to set up a date and time for your test.   For non-scheduling related questions, please contact the cardiac imaging nurse navigator should you have any questions/concerns: Marchia Bond, RN Navigator Cardiac Imaging [name] [phone] office  For scheduling needs, including cancellations and rescheduling, please call [phone].

## 2019-09-12 ENCOUNTER — Telehealth: Payer: Self-pay | Admitting: Family Medicine

## 2019-09-12 NOTE — Telephone Encounter (Signed)
Please send pain referral to preferred pain management

## 2019-09-13 ENCOUNTER — Ambulatory Visit: Payer: Medicare HMO

## 2019-09-13 ENCOUNTER — Other Ambulatory Visit: Payer: Medicare HMO | Admitting: *Deleted

## 2019-09-13 ENCOUNTER — Other Ambulatory Visit: Payer: Self-pay

## 2019-09-13 DIAGNOSIS — R072 Precordial pain: Secondary | ICD-10-CM

## 2019-09-13 LAB — BASIC METABOLIC PANEL
BUN/Creatinine Ratio: 14 (ref 9–23)
BUN: 16 mg/dL (ref 6–24)
CO2: 23 mmol/L (ref 20–29)
Calcium: 9.6 mg/dL (ref 8.7–10.2)
Chloride: 100 mmol/L (ref 96–106)
Creatinine, Ser: 1.17 mg/dL — ABNORMAL HIGH (ref 0.57–1.00)
GFR calc Af Amer: 59 mL/min/{1.73_m2} — ABNORMAL LOW (ref >59)
GFR calc non Af Amer: 51 mL/min/{1.73_m2} — ABNORMAL LOW (ref >59)
Glucose: 91 mg/dL (ref 65–99)
Potassium: 3.7 mmol/L (ref 3.5–5.2)
Sodium: 140 mmol/L (ref 134–144)

## 2019-09-13 NOTE — Progress Notes (Deleted)
Patient ID: Caroline Williams                 DOB: Aug 17, 1960                    MRN: [id number]     HPI: Caroline Williams is a 59 y.o. female patient referred to lipid clinic by Dr Tamala Julian. PMH is significant for DM2, HTN, HLD, bipolar 1 disorder, COPD, tobacco abuse, and OSA. She was referred to cardiology last month due to chest pain and for continuation of Repatha therapy which her PCP had previously started her on. She is pending CT FFR and calcium score.  Off of repatha? When did she stop? What statins is she intolerant to? Need new insurance card  Current Medications: none Intolerances: rosuvastatin 16m daily, simvastatin 426mand 8037maily, Zetia 46m31mily Risk Factors: former tobacco abuse, DM, HTN, obesity, family history LDL goal: <70mg47m Diet:   Exercise:   Family History: Mother and father with CAD s/p stenting. DM, HTN and stroke in her father. HTN in her mother.  Social History: Former smoker - quit in 2020. Smoked for > 30 years. Social alcohol use.  Labs: 07/06/19: TC 213, TG 100, HDL 74, LDL 121  Past Medical History:  Diagnosis Date  . Anxiety   . Arthritis   . Asthma   . Bipolar 1 disorder ([facility]) [place] Breast discharge   . Breast lump   . Breast pain   . Chronic pain    on MS Contin and oxycodone  . Depression   . Diabetes mellitus    diet and exercise controlled  . Escherichia coli (E. coli) infection   . Fever   . GERD (gastroesophageal reflux disease)   . History of chest pain   . History of kidney stones   . History of knee replacement, total   . Hypertension   . Hypothyroidism   . N&V (nausea and vomiting)   . Peripheral neuropathy    Right foot no sensation  . Pneumonia    2015,2014  . Sleep apnea    does not use CPAP  . Thyroid disease   . Wears glasses     Current Outpatient Medications on File Prior to Visit  Medication Sig Dispense Refill  . albuterol (PROVENTIL HFA;VENTOLIN HFA) 108 (90 BASE) MCG/ACT inhaler Inhale 2 puffs into  the lungs every 6 (six) hours as needed for wheezing or shortness of breath. Shortness of breath    . ALPRAZolam (XANAX) 1 MG tablet Take 1 mg by mouth 4 (four) times daily.   0  . amLODipine (NORVASC) 5 MG tablet Take 1 tablet (5 mg total) by mouth daily. 90 tablet 1  . amphetamine-dextroamphetamine (ADDERALL XR) 30 MG 24 hr capsule Take 30 mg by mouth daily.     . ampMarland Kitchenetamine-dextroamphetamine (ADDERALL) 20 MG tablet Take 10 mg by mouth every evening. Pt. Is taking 46mg 53mhe evening.  0  . Blood Glucose Monitoring Suppl (ACCU-CHEK GUIDE) w/Device KIT 1 kit by Does not apply route 3 (three) times daily. To check blood sugars 1 kit 0  . cariprazine (VRAYLAR) capsule 3 mg daily. 4.5 mg daily    . CHANTIX STARTING MONTH PAK 0.5 MG X 11 & 1 MG X 42 tablet     . Cyanocobalamin (VITAMIN B 12 PO) Take 1 tablet by mouth daily.     . cyclobenzaprine (FLEXERIL) 10 MG tablet Take 1/2 to 1 whole tablet by mouth  every 8 hours as needed for muscle pain/spasms. 20 tablet 0  . doxepin (SINEQUAN) 75 MG capsule Take 75 mg by mouth at bedtime.  0  . EPINEPHrine 0.3 mg/0.3 mL IJ SOAJ injection Inject 0.3 mg as directed as needed (severe allergic reaction).     Marland Kitchen glucose blood (ACCU-CHEK GUIDE) test strip Use as instructed 100 each 12  . levothyroxine (SYNTHROID) 112 MCG tablet Take 1 tablet (112 mcg total) by mouth daily. 90 tablet 1  . lidocaine-prilocaine (EMLA) cream Apply 1 application topically as needed. 30 g 0  . linaclotide (LINZESS) 290 MCG CAPS capsule Take 290 mcg by mouth at bedtime.    . metoCLOPramide (REGLAN) 10 MG tablet Take 1 tablet (10 mg total) by mouth every 8 (eight) hours as needed for nausea. 30 tablet 0  . metoprolol tartrate (LOPRESSOR) 100 MG tablet Take one tablet by mouth 2 hours prior to CT 1 tablet 0  . morphine (MS CONTIN) 15 MG 12 hr tablet Take 1 tablet (15 mg total) by mouth every 12 (twelve) hours. 60 tablet 0  . Multiple Vitamin (MULTIVITAMIN) tablet Take 1 tablet by mouth  every morning.     . nabumetone (RELAFEN) 750 MG tablet Take 750 mg by mouth 2 (two) times daily.    . nitroGLYCERIN (NITROSTAT) 0.4 MG SL tablet DISSOLVE 1 TABLET UNDER THE TONGUE EVERY 5 MINUTES AS NEEDED 300 tablet 3  . olmesartan-hydrochlorothiazide (BENICAR HCT) 20-12.5 MG tablet Take 1 tablet by mouth daily. 90 tablet 1  . omeprazole (PRILOSEC) 40 MG capsule Take 1 capsule (40 mg total) by mouth daily. 90 capsule 1  . Oxycodone HCl 10 MG TABS Take 1 tablet (10 mg total) by mouth every 6 (six) hours as needed (for pain). 120 tablet 0  . pregabalin (LYRICA) 100 MG capsule Take 1 capsule (100 mg total) by mouth 3 (three) times daily. 90 capsule 1  . tiZANidine (ZANAFLEX) 2 MG tablet Take 2 mg by mouth every 8 (eight) hours as needed.    . umeclidinium-vilanterol (ANORO ELLIPTA) 62.5-25 MCG/INH AEPB Inhale 1 puff into the lungs daily. 1 each 0  . VOLTAREN 1 % GEL Apply 4 g topically 4 (four) times daily. 100 g 11  . vortioxetine HBr (TRINTELLIX) 10 MG TABS tablet 10 mg daily.    . [DISCONTINUED] escitalopram (LEXAPRO) 20 MG tablet Take 20 mg by mouth daily.    . [DISCONTINUED] LATUDA 120 MG TABS Take 120 mg by mouth at bedtime.   2  . [DISCONTINUED] LISINOPRIL PO Take by mouth daily.       No current facility-administered medications on file prior to visit.    Allergies  Allergen Reactions  . Aspirin Anaphylaxis and Other (See Comments)    Pt states she has had Toradol several times without any reactions  . Bee Venom Anaphylaxis  . Sulfa Antibiotics Anaphylaxis  . Other Other (See Comments)    No blood products.  Patient did  Request that only albumin or albumin-containing products may be administered    Assessment/Plan:  1. Hyperlipidemia -

## 2019-09-13 NOTE — Telephone Encounter (Signed)
Per patient request   [facility] Referral to [facility] Ph.# [phone] address [address] waiting for an appointment

## 2019-09-14 NOTE — Progress Notes (Signed)
Patient ID: Caroline Williams                 DOB: 09-30-1960                    MRN: [id number]     HPI: Caroline Williams is a 59 y.o. female patient referred to lipid clinic by Dr. Tamala Williams. PMH is significant for DM II, hypertension, COPD, tobacco, and hyperlipidemia. Patient was tried on a statin in the past but unable to tolerate. She was placed on Repatha by PCP but has since switched PCP's and now has been referred to cardiology for continuation of PCSK9i. Patient is to undergo Coronary CT with FFR.   Patient presents today to the lipid clinic. She states she has been off of Repatha for probably 8 months. She has dual eligible for medicare/medicaid. She has tried at least 3 statins. All of them causes her shortness of breath.   Current Medications: none Intolerances: lipitor, zocor, crestor (breathing problems- shortness of breath) Risk Factors: family history, DM, HTN LDL goal: <70 per Dr. Tamala Williams   Diet: breakfast: bacon, toast, grapefruit Lunch: may skip Dinner: vegetable, chicken, fish, pork chop (baked) fried food once or twice a week Snack- cookies, chips,yogurt Drink- soda (regular)  Exercise: somewhat limited by cane  Family History: both mother and father had stents.  Father died of an MI. The patient's family history includes Cancer in her father; Depression in her sister; Diabetes in her father; Heart disease in her father; Hypertension in her father and mother; Stroke in her father. There is no history of Breast cancer.  Social History: prior smoker  Labs:07/06/19 TC 213, TG 100, HDL 74, LDL 121 (no medications)  Past Medical History:  Diagnosis Date  . Anxiety   . Arthritis   . Asthma   . Bipolar 1 disorder ([place])   . Breast discharge   . Breast lump   . Breast pain   . Chronic pain    on MS Contin and oxycodone  . Depression   . Diabetes mellitus    diet and exercise controlled  . Escherichia coli (E. coli) infection   . Fever   . GERD (gastroesophageal reflux  disease)   . History of chest pain   . History of kidney stones   . History of knee replacement, total   . Hypertension   . Hypothyroidism   . N&V (nausea and vomiting)   . Peripheral neuropathy    Right foot no sensation  . Pneumonia    2015,2014  . Sleep apnea    does not use CPAP  . Thyroid disease   . Wears glasses     Current Outpatient Medications on File Prior to Visit  Medication Sig Dispense Refill  . albuterol (PROVENTIL HFA;VENTOLIN HFA) 108 (90 BASE) MCG/ACT inhaler Inhale 2 puffs into the lungs every 6 (six) hours as needed for wheezing or shortness of breath. Shortness of breath    . ALPRAZolam (XANAX) 1 MG tablet Take 1 mg by mouth 4 (four) times daily.   0  . amLODipine (NORVASC) 5 MG tablet Take 1 tablet (5 mg total) by mouth daily. 90 tablet 1  . amphetamine-dextroamphetamine (ADDERALL XR) 30 MG 24 hr capsule Take 30 mg by mouth daily.     Marland Kitchen amphetamine-dextroamphetamine (ADDERALL) 20 MG tablet Take 10 mg by mouth every evening. Pt. Is taking '10mg'$  in the evening.  0  . Blood Glucose Monitoring Suppl (ACCU-CHEK GUIDE) w/Device KIT 1  kit by Does not apply route 3 (three) times daily. To check blood sugars 1 kit 0  . cariprazine (VRAYLAR) capsule 3 mg daily. 4.5 mg daily    . CHANTIX STARTING MONTH PAK 0.5 MG X 11 & 1 MG X 42 tablet     . Cyanocobalamin (VITAMIN B 12 PO) Take 1 tablet by mouth daily.     . cyclobenzaprine (FLEXERIL) 10 MG tablet Take 1/2 to 1 whole tablet by mouth every 8 hours as needed for muscle pain/spasms. 20 tablet 0  . doxepin (SINEQUAN) 75 MG capsule Take 75 mg by mouth at bedtime.  0  . EPINEPHrine 0.3 mg/0.3 mL IJ SOAJ injection Inject 0.3 mg as directed as needed (severe allergic reaction).     Marland Kitchen glucose blood (ACCU-CHEK GUIDE) test strip Use as instructed 100 each 12  . levothyroxine (SYNTHROID) 112 MCG tablet Take 1 tablet (112 mcg total) by mouth daily. 90 tablet 1  . lidocaine-prilocaine (EMLA) cream Apply 1 application topically as  needed. 30 g 0  . linaclotide (LINZESS) 290 MCG CAPS capsule Take 290 mcg by mouth at bedtime.    . metoCLOPramide (REGLAN) 10 MG tablet Take 1 tablet (10 mg total) by mouth every 8 (eight) hours as needed for nausea. 30 tablet 0  . metoprolol tartrate (LOPRESSOR) 100 MG tablet Take one tablet by mouth 2 hours prior to CT 1 tablet 0  . morphine (MS CONTIN) 15 MG 12 hr tablet Take 1 tablet (15 mg total) by mouth every 12 (twelve) hours. 60 tablet 0  . Multiple Vitamin (MULTIVITAMIN) tablet Take 1 tablet by mouth every morning.     . nabumetone (RELAFEN) 750 MG tablet Take 750 mg by mouth 2 (two) times daily.    . nitroGLYCERIN (NITROSTAT) 0.4 MG SL tablet DISSOLVE 1 TABLET UNDER THE TONGUE EVERY 5 MINUTES AS NEEDED 300 tablet 3  . olmesartan-hydrochlorothiazide (BENICAR HCT) 20-12.5 MG tablet Take 1 tablet by mouth daily. 90 tablet 1  . omeprazole (PRILOSEC) 40 MG capsule Take 1 capsule (40 mg total) by mouth daily. 90 capsule 1  . Oxycodone HCl 10 MG TABS Take 1 tablet (10 mg total) by mouth every 6 (six) hours as needed (for pain). 120 tablet 0  . pregabalin (LYRICA) 100 MG capsule Take 1 capsule (100 mg total) by mouth 3 (three) times daily. 90 capsule 1  . tiZANidine (ZANAFLEX) 2 MG tablet Take 2 mg by mouth every 8 (eight) hours as needed.    . umeclidinium-vilanterol (ANORO ELLIPTA) 62.5-25 MCG/INH AEPB Inhale 1 puff into the lungs daily. 1 each 0  . VOLTAREN 1 % GEL Apply 4 g topically 4 (four) times daily. 100 g 11  . vortioxetine HBr (TRINTELLIX) 10 MG TABS tablet 10 mg daily.    . [DISCONTINUED] escitalopram (LEXAPRO) 20 MG tablet Take 20 mg by mouth daily.    . [DISCONTINUED] LATUDA 120 MG TABS Take 120 mg by mouth at bedtime.   2  . [DISCONTINUED] LISINOPRIL PO Take by mouth daily.       No current facility-administered medications on file prior to visit.    Allergies  Allergen Reactions  . Aspirin Anaphylaxis and Other (See Comments)    Pt states she has had Toradol several  times without any reactions  . Bee Venom Anaphylaxis  . Sulfa Antibiotics Anaphylaxis  . Other Other (See Comments)    No blood products.  Patient did  Request that only albumin or albumin-containing products may be administered  Assessment/Plan:  1. Hyperlipidemia - LDL is above goal if <70 off of lipid medications. I will submit prior authorization to [employer] to resume Repatha. Patient in agreement. She did not have her insurance card on her. I found one from 2019 on file. I will try using that information and if it doesn't work, will call patient for information on new card. Educated patient on limiting fried food and processed meats. Also encouraged her to stop drinking soda. Will call patient once Repatha approved.   Thank you,  Ramond Dial, Pharm.D, BCPS, CPP [facility]  [address], [place], [place] [address]  Phone: [phone]; Fax: [phone]

## 2019-09-17 ENCOUNTER — Telehealth: Payer: Self-pay | Admitting: Pharmacist

## 2019-09-17 ENCOUNTER — Other Ambulatory Visit: Payer: Self-pay

## 2019-09-17 ENCOUNTER — Ambulatory Visit (INDEPENDENT_AMBULATORY_CARE_PROVIDER_SITE_OTHER): Payer: Medicare HMO | Admitting: Pharmacist

## 2019-09-17 DIAGNOSIS — E785 Hyperlipidemia, unspecified: Secondary | ICD-10-CM

## 2019-09-17 MED ORDER — REPATHA SURECLICK 140 MG/ML ~~LOC~~ SOAJ: 1.0000 "pen " | SUBCUTANEOUS | 11 refills | Status: DC

## 2019-09-17 NOTE — Telephone Encounter (Signed)
Repatha prior auth submitted. Per covermymeds Josem Kaufmann is already on file through 05/03/2020. Rx for Repatha sent to pharmacy Called pharmacy -cost is $4. Had to order- hoping it comes in today. Called pt and left VM to return call Will also need to set pt up for labs in 2 months

## 2019-09-17 NOTE — Patient Instructions (Signed)
It was a pleasure to meet you today!  I will submit a prior authorization to your insurance for [facility]. Once approved I will give you a call.  Try to avoid fried foods, processed meats (bacon) and limit/eliminate soda.  Please feel free to call us at [phone] with any questions or concers

## 2019-09-21 NOTE — Telephone Encounter (Signed)
Called patient again. Made aware that Repatha was approved. She was already able to pick up at pharmacy. Did not wish to schedule labs at this time. I will call patient in 2 months to set up

## 2019-10-04 ENCOUNTER — Telehealth (HOSPITAL_COMMUNITY): Payer: Self-pay | Admitting: Emergency Medicine

## 2019-10-04 NOTE — Telephone Encounter (Signed)
Attempted to call patient regarding upcoming cardiac CT appointment. °Left message on voicemail with name and callback number °Breklyn Fabrizio RN Navigator Cardiac Imaging °[facility] °[phone] Office °[phone] Cell ° °

## 2019-10-05 ENCOUNTER — Encounter (HOSPITAL_COMMUNITY): Payer: Self-pay

## 2019-10-05 ENCOUNTER — Other Ambulatory Visit: Payer: Self-pay

## 2019-10-05 ENCOUNTER — Ambulatory Visit (HOSPITAL_COMMUNITY)
Admission: RE | Admit: 2019-10-05 | Discharge: 2019-10-05 | Disposition: A | Discharge status: Home / Self Care | Payer: Medicare HMO | Source: Ambulatory Visit | Attending: Interventional Cardiology | Admitting: Interventional Cardiology

## 2019-10-05 DIAGNOSIS — R072 Precordial pain: Secondary | ICD-10-CM | POA: Diagnosis not present

## 2019-10-05 MED ORDER — NITROGLYCERIN 0.4 MG SL SUBL
SUBLINGUAL_TABLET | SUBLINGUAL | Status: AC
  Filled 2019-10-05: qty 2

## 2019-10-05 MED ORDER — NITROGLYCERIN 0.4 MG SL SUBL
0.8000 mg | SUBLINGUAL_TABLET | Freq: Once | SUBLINGUAL | Status: AC
  Administered 2019-10-05: 0.8 mg via SUBLINGUAL

## 2019-10-05 MED ORDER — METOPROLOL TARTRATE 5 MG/5ML IV SOLN: 5.0000 mg | INTRAVENOUS | Status: DC | PRN

## 2019-10-05 MED ORDER — IOHEXOL 350 MG/ML SOLN
80.0000 mL | Freq: Once | INTRAVENOUS | Status: AC | PRN
  Administered 2019-10-05: 80 mL via INTRAVENOUS

## 2019-10-08 ENCOUNTER — Other Ambulatory Visit: Payer: Self-pay | Admitting: *Deleted

## 2019-10-08 DIAGNOSIS — R079 Chest pain, unspecified: Secondary | ICD-10-CM

## 2019-10-08 MED ORDER — NITROGLYCERIN 0.4 MG SL SUBL: SUBLINGUAL_TABLET | SUBLINGUAL | 3 refills | Status: DC

## 2019-11-12 ENCOUNTER — Encounter (HOSPITAL_COMMUNITY): Payer: Self-pay

## 2019-11-12 ENCOUNTER — Ambulatory Visit (HOSPITAL_COMMUNITY)
Admission: EM | Admit: 2019-11-12 | Discharge: 2019-11-12 | Disposition: A | Discharge status: Home / Self Care | Payer: Medicare HMO | Attending: Physician Assistant | Admitting: Physician Assistant

## 2019-11-12 ENCOUNTER — Other Ambulatory Visit: Payer: Self-pay

## 2019-11-12 DIAGNOSIS — M25561 Pain in right knee: Secondary | ICD-10-CM | POA: Diagnosis not present

## 2019-11-12 DIAGNOSIS — G8929 Other chronic pain: Secondary | ICD-10-CM | POA: Diagnosis not present

## 2019-11-12 DIAGNOSIS — M25562 Pain in left knee: Secondary | ICD-10-CM | POA: Diagnosis not present

## 2019-11-12 DIAGNOSIS — M25531 Pain in right wrist: Secondary | ICD-10-CM

## 2019-11-12 MED ORDER — PREDNISONE 10 MG PO TABS: 30.0000 mg | ORAL_TABLET | Freq: Every day | ORAL | 0 refills | Status: AC

## 2019-11-12 MED ORDER — KETOROLAC TROMETHAMINE 30 MG/ML IJ SOLN
30.0000 mg | Freq: Once | INTRAMUSCULAR | Status: AC
  Administered 2019-11-12: 30 mg via INTRAMUSCULAR

## 2019-11-12 MED ORDER — KETOROLAC TROMETHAMINE 30 MG/ML IJ SOLN
INTRAMUSCULAR | Status: AC
  Filled 2019-11-12: qty 1

## 2019-11-12 NOTE — ED Provider Notes (Signed)
[facility]    CSN: [id number] Arrival date & time: 11/12/19  1117      History   Chief Complaint Chief Complaint  Patient presents with  . leg, arm pain    HPI Caroline Williams is a 59 y.o. female.   Patient with history of osteoarthritis and chronic knee pains presents for 1 day history of acute on chronic right knee pain.  She reports she had this knee replaced and then resurfaced again several years ago.  She reports she was followed by pain specialist and orthopedic surgeon however they were not open today and this is why she presents urgent care.  She reports for the last day the right knee has been very painful.  Denies redness, swelling or fever or chills.  She reports the pain is on the inside of her knee.  She also reports her left knee is bothering her but is basically at baseline for her.  She also reports right wrist pain.  She reports wrist pain has been there for a few days.  She reports she wears a brace for this.  She denies lower extremity swelling.  Denies rashes     Past Medical History:  Diagnosis Date  . Anxiety   . Arthritis   . Asthma   . Bipolar 1 disorder ([place])   . Breast discharge   . Breast lump   . Breast pain   . Chronic pain    on MS Contin and oxycodone  . Depression   . Diabetes mellitus    diet and exercise controlled  . Escherichia coli (E. coli) infection   . Fever   . GERD (gastroesophageal reflux disease)   . History of chest pain   . History of kidney stones   . History of knee replacement, total   . Hypertension   . Hypothyroidism   . N&V (nausea and vomiting)   . Peripheral neuropathy    Right foot no sensation  . Pneumonia    2015,2014  . Sleep apnea    does not use CPAP  . Thyroid disease   . Wears glasses     Patient Active Problem List   Diagnosis Date Noted  . Hyperlipidemia 09/17/2019  . COPD mixed type ([place]) 04/17/2019  . Tobacco user 04/17/2019  . Nocturnal hypoxemia 12/25/2018  . Change in bowel  habits 03/14/2018  . Chest pain 01/26/2016  . Hallucination, drug-induced ([place]) 03/12/2015  . Hypothyroidism 03/12/2015  . Lap gastric bypass June 2016 11/25/2014  . Diabetes mellitus with neuropathy ([place]) 06/20/2013  . Chronic pain of right knee 06/20/2013  . Morbid obesity ([place]) 07/05/2012    Past Surgical History:  Procedure Laterality Date  . ABDOMINAL HYSTERECTOMY     partial  . APPENDECTOMY    . BIOPSY  03/14/2018   Procedure: BIOPSY;  Surgeon: Wilford Corner, MD;  Location: [facility];  Service: Endoscopy;;  . BREAST DUCTAL SYSTEM EXCISION Right 08/31/2017   Procedure: RIGHT BREAST CENTRAL DUCT EXCISION;  Surgeon: Erroll Luna, MD;  Location: [facility];  Service: General;  Laterality: Right;  . BREAST EXCISIONAL BIOPSY Right   . BREAST LUMPECTOMY     right  . CARDIAC CATHETERIZATION     no significant CAD, nl LV function by 11/08/06 cath  . CESAREAN SECTION     x4  . COLONOSCOPY WITH PROPOFOL N/A 03/14/2018   Procedure: COLONOSCOPY WITH PROPOFOL;  Surgeon: Wilford Corner, MD;  Location: [facility];  Service: Endoscopy;  Laterality: N/A;  .  GASTRIC ROUX-EN-Y N/A 11/25/2014   Procedure: LAPAROSCOPIC ROUX-EN-Y GASTRIC BYPASS WITH UPPER ENDOSCOPY;  Surgeon: Johnathan Hausen, MD;  Location: [facility];  Service: General;  Laterality: N/A;  . HERNIA REPAIR    . JOINT REPLACEMENT    . KNEE SURGERY     right  . MOUTH SURGERY    . [place]  . POLYPECTOMY  03/14/2018   Procedure: POLYPECTOMY;  Surgeon: Wilford Corner, MD;  Location: [facility];  Service: Endoscopy;;  . TOTAL KNEE REVISION  06/21/2012   Procedure: TOTAL KNEE REVISION;  Surgeon: Newt Minion, MD;  Location: [facility];  Service: Orthopedics;  Laterality: Right;  Revision Right Total Knee Arthroplasty    OB History   No obstetric history on file.      Home Medications    Prior to Admission medications   Medication Sig Start Date End Date Taking? Authorizing  Provider  albuterol (PROVENTIL HFA;VENTOLIN HFA) 108 (90 BASE) MCG/ACT inhaler Inhale 2 puffs into the lungs every 6 (six) hours as needed for wheezing or shortness of breath. Shortness of breath    [provider]  ALPRAZolam (XANAX) 1 MG tablet Take 1 mg by mouth 4 (four) times daily.  07/02/14   [provider]  amLODipine (NORVASC) 5 MG tablet Take 1 tablet (5 mg total) by mouth daily. 07/05/19   Fulp, Cammie, MD  amphetamine-dextroamphetamine (ADDERALL XR) 30 MG 24 hr capsule Take 30 mg by mouth daily.  03/10/18   [provider]  amphetamine-dextroamphetamine (ADDERALL) 20 MG tablet Take 10 mg by mouth every evening. Pt. Is taking 67m in the evening. 10/13/17   [provider]  Blood Glucose Monitoring Suppl (ACCU-CHEK GUIDE) w/Device KIT 1 kit by Does not apply route 3 (three) times daily. To check blood sugars 07/05/19   Fulp, Cammie, MD  cariprazine (VRAYLAR) capsule 3 mg daily. 4.5 mg daily    [provider]  CHANTIX STARTING MONTH PAK 0.5 MG X 11 & 1 MG X 42 tablet  04/03/19   [provider]  Cyanocobalamin (VITAMIN B 12 PO) Take 1 tablet by mouth daily.     [provider]  cyclobenzaprine (FLEXERIL) 10 MG tablet Take 1/2 to 1 whole tablet by mouth every 8 hours as needed for muscle pain/spasms. 07/26/18   AKaty Apo NP  doxepin (SINEQUAN) 75 MG capsule Take 75 mg by mouth at bedtime. 03/02/18   [provider]  EPINEPHrine 0.3 mg/0.3 mL IJ SOAJ injection Inject 0.3 mg as directed as needed (severe allergic reaction).  01/11/14   [provider]  Evolocumab (REPATHA SURECLICK) 1373MG/ML SOAJ Inject 1 pen into the skin every 14 (fourteen) days. 09/17/19   SBelva Crome MD  glucose blood (ACCU-CHEK GUIDE) test strip Use as instructed 07/05/19   Fulp, Cammie, MD  levothyroxine (SYNTHROID) 112 MCG tablet Take 1 tablet (112 mcg total) by mouth daily. 07/05/19   Fulp, Cammie, MD  lidocaine-prilocaine (EMLA) cream  Apply 1 application topically as needed. 01/29/19   HVanessa Kick MD  linaclotide (LINZESS) 290 MCG CAPS capsule Take 290 mcg by mouth at bedtime.    [provider]  metoCLOPramide (REGLAN) 10 MG tablet Take 1 tablet (10 mg total) by mouth every 8 (eight) hours as needed for nausea. 06/30/18   Mesner, JCorene Cornea MD  metoprolol tartrate (LOPRESSOR) 100 MG tablet Take one tablet by mouth 2 hours prior to CT 08/27/19   SBelva Crome MD  morphine (MS CONTIN) 15  MG 12 hr tablet Take 1 tablet (15 mg total) by mouth every 12 (twelve) hours. 11/14/14   Bayard Hugger, NP  Multiple Vitamin (MULTIVITAMIN) tablet Take 1 tablet by mouth every morning.     [provider]  nabumetone (RELAFEN) 750 MG tablet Take 750 mg by mouth 2 (two) times daily. 08/08/19   [provider]  nitroGLYCERIN (NITROSTAT) 0.4 MG SL tablet DISSOLVE 1 TABLET UNDER THE TONGUE EVERY 5 MINUTES AS NEEDED 10/08/19   Belva Crome, MD  olmesartan-hydrochlorothiazide (BENICAR HCT) 20-12.5 MG tablet Take 1 tablet by mouth daily. 07/05/19   Fulp, Cammie, MD  omeprazole (PRILOSEC) 40 MG capsule Take 1 capsule (40 mg total) by mouth daily. 07/05/19   Fulp, Cammie, MD  Oxycodone HCl 10 MG TABS Take 1 tablet (10 mg total) by mouth every 6 (six) hours as needed (for pain). 11/14/14   Bayard Hugger, NP  predniSONE (DELTASONE) 10 MG tablet Take 3 tablets (30 mg total) by mouth daily for 5 days. 11/12/19 11/17/19  Sutton Hirsch, Marguerita Beards, PA-C  pregabalin (LYRICA) 100 MG capsule Take 1 capsule (100 mg total) by mouth 3 (three) times daily. 08/08/19   Argentina Donovan, PA-C  tiZANidine (ZANAFLEX) 2 MG tablet Take 2 mg by mouth every 8 (eight) hours as needed. 03/12/19   [provider]  umeclidinium-vilanterol (ANORO ELLIPTA) 62.5-25 MCG/INH AEPB Inhale 1 puff into the lungs daily. 04/17/19   Baird Lyons D, MD  VOLTAREN 1 % GEL Apply 4 g topically 4 (four) times daily. 12/25/17   Tereasa Coop, PA-C  vortioxetine HBr (TRINTELLIX) 10  MG TABS tablet 10 mg daily.    [provider]  escitalopram (LEXAPRO) 20 MG tablet Take 20 mg by mouth daily.  12/14/18  [provider]  LATUDA 120 MG TABS Take 120 mg by mouth at bedtime.  03/04/18 12/14/18  [provider]  LISINOPRIL PO Take by mouth daily.    09/06/11  [provider]    Family History Family History  Problem Relation Age of Onset  . Cancer Father        colon  . Stroke Father   . Heart disease Father   . Diabetes Father   . Hypertension Father   . Depression Sister   . Hypertension Mother   . Breast cancer Neg Hx     Social History Social History   Tobacco Use  . Smoking status: Former Smoker    Packs/day: 0.25    Years: 43.00    Pack years: 10.75    Types: Cigarettes    Quit date: 2020    Years since quitting: 1.4  . Smokeless tobacco: Never Used  Substance Use Topics  . Alcohol use: Yes    Comment: social  . Drug use: No     Allergies   Aspirin, Bee venom, Sulfa antibiotics, and Other   Review of Systems Review of Systems   Physical Exam Triage Vital Signs ED Triage Vitals  Enc Vitals Group     BP 11/12/19 1148 (!) 169/80     Pulse Rate 11/12/19 1148 83     Resp 11/12/19 1148 18     Temp 11/12/19 1148 97.9 F (36.6 C)     Temp Source 11/12/19 1148 Oral     SpO2 11/12/19 1148 100 %     Weight 11/12/19 1153 221 lb (100.2 kg)     Height 11/12/19 1153 _0  (1.651 m)     Head Circumference --  Peak Flow --      Pain Score 11/12/19 1153 9     Pain Loc --      Pain Edu? --      Excl. in [place]? --    No data found.  Updated Vital Signs BP (!) 169/80   Pulse 83   Temp 97.9 F (36.6 C) (Oral)   Resp 18   Ht _0  (1.651 m)   Wt 221 lb (100.2 kg)   SpO2 100%   BMI 36.78 kg/m   Visual Acuity Right Eye Distance:   Left Eye Distance:   Bilateral Distance:    Right Eye Near:   Left Eye Near:    Bilateral Near:     Physical Exam Vitals and nursing note reviewed.  Constitutional:       General: She is not in acute distress.    Appearance: She is well-developed. She is not ill-appearing.  HENT:     Head: Normocephalic and atraumatic.  Eyes:     Conjunctiva/sclera: Conjunctivae normal.  Cardiovascular:     Rate and Rhythm: Normal rate.  Pulmonary:     Effort: Pulmonary effort is normal. No respiratory distress.  Musculoskeletal:     Cervical back: Neck supple.     Comments: Right knee with vertical healed surgical scar.  There is no erythema, redness or warmth of the joint.  There is some mild swelling over the pes anserinus bursa, with tenderness to palpation over this area.  Patient has full range of motion without issue.  There is no lower extremity swelling in the right leg.  Left knee without swelling, erythema.  Full range of motion.  Nontender.  Right wrist with tenderness to palpation over the medial radius.  Some pain with ulnar deviation.  Finkelstein's mildly positive.  Skin:    General: Skin is warm and dry.  Neurological:     Mental Status: She is alert.      UC Treatments / Results  Labs (all labs ordered are listed, but only abnormal results are displayed) Labs Reviewed - No data to display  EKG   Radiology No results found.  Procedures Procedures (including critical care time)  Medications Ordered in UC Medications  ketorolac (TORADOL) 30 MG/ML injection 30 mg (30 mg Intramuscular Given 11/12/19 1256)    Initial Impression / Assessment and Plan / UC Course  I have reviewed the triage vital signs and the nursing notes.  Pertinent labs & imaging results that were available during my care of the patient were reviewed by me and considered in my medical decision making (see chart for details).     #Chronic knee pain #Right wrist pain Patient is a 59 year old presenting with acute on chronic right knee pain.  She also has right wrist pain that appears to be consistent with a tenosynovitis.  Doubt septic joint..  Patient reports Toradol  and prednisone has worked for her in the past.  She is on a pain contract currently and is prescribed numerous opiate pain medicines and Lyrica.  We discussed we can do Toradol here and a short burst of steroid prednisone until she can be seen by her pain management and orthopedics.  Discussed continued use of the wrist brace.  Strict emergency department precautions for signs of septic joint, she verbalized understanding this. Final Clinical Impressions(s) / UC Diagnoses   Final diagnoses:  Chronic pain of both knees  Right wrist pain     Discharge Instructions     Take the prednisone  for the next 5 days Schedule follow-up with your pain specialist and your orthopedic to further discuss your chronic pains  Continue your pain management at home as already prescribed  If you notice significant swelling, redness in the right knee or develop a fever please report to the emergency department for evaluation.      ED Prescriptions    Medication Sig Dispense Auth. Provider   predniSONE (DELTASONE) 10 MG tablet Take 3 tablets (30 mg total) by mouth daily for 5 days. 15 tablet Micai Apolinar, Marguerita Beards, PA-C     PDMP not reviewed this encounter.   Purnell Shoemaker, PA-C 11/12/19 1330

## 2019-11-12 NOTE — ED Triage Notes (Signed)
Pt c/o 9/10 stabbing dull ache pain in right leg and arm. Pt states the pain "flares up time to time." Pt states has osteoarthritis in both legs. Pt walked to triage room with cane.

## 2019-11-12 NOTE — Discharge Instructions (Signed)
Take the prednisone for the next 5 days Schedule follow-up with your pain specialist and your orthopedic to further discuss your chronic pains  Continue your pain management at home as already prescribed  If you notice significant swelling, redness in the right knee or develop a fever please report to the emergency department for evaluation.

## 2019-11-21 ENCOUNTER — Telehealth: Payer: Self-pay | Admitting: Pharmacist

## 2019-11-21 NOTE — Telephone Encounter (Signed)
Called patient to set up lab appointment for lipids. She stated she was napping and would like to call me back in the AM.

## 2019-11-23 ENCOUNTER — Telehealth: Payer: Self-pay | Admitting: Family Medicine

## 2019-11-23 NOTE — Telephone Encounter (Signed)
[facility] called and requested to inform pcp that the patients TSH labs were elevated and wanted the patient to retest. Rep was informed of note placed by [facility] regarding labs. Rep stated she would call the patient and request for her to do lab work.

## 2019-12-05 ENCOUNTER — Telehealth: Payer: Self-pay

## 2019-12-05 ENCOUNTER — Telehealth: Payer: Self-pay | Admitting: *Deleted

## 2019-12-05 DIAGNOSIS — E785 Hyperlipidemia, unspecified: Secondary | ICD-10-CM

## 2019-12-05 NOTE — Telephone Encounter (Signed)
lmomed for lipid labs to schedule

## 2019-12-05 NOTE — Telephone Encounter (Signed)
Patient was called to regarding paperwork faxed from [employer] regarding her incontinence supplies.  Name and DOB verfied. Patient has h/o DM.  Patient c/o frequent urination, burning when she voids, and a  strong odor. Patient denies fever, back pain , or discharge.  States her blood sugars have been elevated as well. Informed patient that this too could be causing urinary frequency. Patient unable to provide a range of blood sugars but states she has had a some lower than 130.   Scheduled an appt with patient for 12/06/2019 at 11 a.m.  Patient verbalized understanding.

## 2019-12-05 NOTE — Telephone Encounter (Signed)
-----   Message from Ramond Dial, [place] sent at 12/05/2019 11:55 AM EDT -----  ----- Message ----- From: Ramond Dial, RPH-CPP Sent: 11/16/2019 To: Ramond Dial, RPH  Set up lipid labs- started repatha

## 2019-12-06 ENCOUNTER — Encounter: Payer: Self-pay | Admitting: Family

## 2019-12-06 ENCOUNTER — Ambulatory Visit: Payer: Medicare HMO | Attending: Family | Admitting: Family

## 2019-12-06 ENCOUNTER — Other Ambulatory Visit: Payer: Self-pay

## 2019-12-06 VITALS — BP 125/86 | HR 64 | Temp 98.3°F | Resp 16 | Ht 65.0 in | Wt 227.0 lb

## 2019-12-06 DIAGNOSIS — E7800 Pure hypercholesterolemia, unspecified: Secondary | ICD-10-CM | POA: Diagnosis not present

## 2019-12-06 DIAGNOSIS — R32 Unspecified urinary incontinence: Secondary | ICD-10-CM

## 2019-12-06 DIAGNOSIS — E1140 Type 2 diabetes mellitus with diabetic neuropathy, unspecified: Secondary | ICD-10-CM

## 2019-12-06 DIAGNOSIS — E039 Hypothyroidism, unspecified: Secondary | ICD-10-CM

## 2019-12-06 DIAGNOSIS — N898 Other specified noninflammatory disorders of vagina: Secondary | ICD-10-CM

## 2019-12-06 DIAGNOSIS — R350 Frequency of micturition: Secondary | ICD-10-CM

## 2019-12-06 LAB — POCT URINALYSIS DIP (CLINITEK)
Bilirubin, UA: NEGATIVE
Glucose, UA: NEGATIVE mg/dL
Ketones, POC UA: NEGATIVE mg/dL
Nitrite, UA: NEGATIVE
POC PROTEIN,UA: NEGATIVE
Spec Grav, UA: 1.025 (ref 1.010–1.025)
Urobilinogen, UA: 0.2 E.U./dL
pH, UA: 6 (ref 5.0–8.0)

## 2019-12-06 LAB — GLUCOSE, POCT (MANUAL RESULT ENTRY): POC Glucose: 101 mg/dl — AB (ref 70–99)

## 2019-12-06 LAB — POCT GLYCOSYLATED HEMOGLOBIN (HGB A1C): Hemoglobin A1C: 6.1 % — AB (ref 4.0–5.6)

## 2019-12-06 NOTE — Patient Instructions (Addendum)
Macrobid for urinary symptoms. Urinalysis to check for UTI. Lipid panel to check cholesterol. Schedule follow-up with primary physician in 1 - 2 months for management of chronic conditions. Urinary Tract Infection, Adult A urinary tract infection (UTI) is an infection of any part of the urinary tract. The urinary tract includes:  The kidneys.  The ureters.  The bladder.  The urethra. These organs make, store, and get rid of pee (urine) in the body. What are the causes? This is caused by germs (bacteria) in your genital area. These germs grow and cause swelling (inflammation) of your urinary tract. What increases the risk? You are more likely to develop this condition if:  You have a small, thin tube (catheter) to drain pee.  You cannot control when you pee or poop (incontinence).  You are female, and: ? You use these methods to prevent pregnancy:  A medicine that kills sperm (spermicide).  A device that blocks sperm (diaphragm). ? You have low levels of a female hormone (estrogen). ? You are pregnant.  You have genes that add to your risk.  You are sexually active.  You take antibiotic medicines.  You have trouble peeing because of: ? A prostate that is bigger than normal, if you are female. ? A blockage in the part of your body that drains pee from the bladder (urethra). ? A kidney stone. ? A nerve condition that affects your bladder (neurogenic bladder). ? Not getting enough to drink. ? Not peeing often enough.  You have other conditions, such as: ? Diabetes. ? A weak disease-fighting system (immune system). ? Sickle cell disease. ? Gout. ? Injury of the spine. What are the signs or symptoms? Symptoms of this condition include:  Needing to pee right away (urgently).  Peeing often.  Peeing small amounts often.  Pain or burning when peeing.  Blood in the pee.  Pee that smells bad or not like normal.  Trouble peeing.  Pee that is cloudy.  Fluid coming  from the vagina, if you are female.  Pain in the belly or lower back. Other symptoms include:  Throwing up (vomiting).  No urge to eat.  Feeling mixed up (confused).  Being tired and grouchy (irritable).  A fever.  Watery poop (diarrhea). How is this treated? This condition may be treated with:  Antibiotic medicine.  Other medicines.  Drinking enough water. Follow these instructions at home:  Medicines  Take over-the-counter and prescription medicines only as told by your doctor.  If you were prescribed an antibiotic medicine, take it as told by your doctor. Do not stop taking it even if you start to feel better. General instructions  Make sure you: ? Pee until your bladder is empty. ? Do not hold pee for a long time. ? Empty your bladder after sex. ? Wipe from front to back after pooping if you are a female. Use each tissue one time when you wipe.  Drink enough fluid to keep your pee pale yellow.  Keep all follow-up visits as told by your doctor. This is important. Contact a doctor if:  You do not get better after 1-2 days.  Your symptoms go away and then come back. Get help right away if:  You have very bad back pain.  You have very bad pain in your lower belly.  You have a fever.  You are sick to your stomach (nauseous).  You are throwing up. Summary  A urinary tract infection (UTI) is an infection of any part of  the urinary tract.  This condition is caused by germs in your genital area.  There are many risk factors for a UTI. These include having a small, thin tube to drain pee and not being able to control when you pee or poop.  Treatment includes antibiotic medicines for germs.  Drink enough fluid to keep your pee pale yellow. This information is not intended to replace advice given to you by your health care provider. Make sure you discuss any questions you have with your health care provider. Document Revised: 05/18/2018 Document Reviewed:  12/08/2017 Elsevier Patient Education  2020 [employer].

## 2019-12-06 NOTE — Progress Notes (Addendum)
Patient ID: Caroline Williams, female    DOB: [birth year]  MRN: [id number]  CC: Frequent Urination   Subjective: Caroline Williams is a 59 y.o. female with history of nocturnal hypoxemia, COPD mixed type, hypothyroidism, diabetes mellitus with neuropathy, morbid obesity, chronic pain of right knee, hallucination drug-induced, chest pain, tobacco user, and hyperlipidemia who presents for frequent urination.   1. FREQUENT URINATION:  Dysuria: no Urinary frequency: yes, 5 times in 1 hour Urgency: yes Small volume voids: yes Symptom severity: yes Urinary incontinence: yes, wears incontinence pads Foul odor: yes Hematuria: no Abdominal pain: no Back pain: no Suprapubic pain/pressure: yes, especially during intercourse Flank pain: no Fever:  no Vomiting: no Relief with cranberry juice: no Previous urinary tract infection: yes Recurrent urinary tract infection: yes Sexual activity: sexually active without using condoms Treatments attempted: cranberry  Vaginal discharge: denies  Reports she has been having these symptoms for the past 1 year and has not received any treatments for this from other providers.   2. DIABETES TYPE 2 FOLLOW-UP: Last visit 07/05/2019 with Dr. Chapman Fitch. During that encounter patient instructed to check blood sugars three times daily CMP, hemoglobin A1C, microalbumin / creatinine ratio, and lipid panel orderd for future. Hemoglobin A1C 5.9 on 07/06/2019. Patient was instructed to follow-up in 4 weeks.  Today A1C: 6.1. Patient reports she is not prescribed any diabetic medications. Reports she has concerns that diabetes was worsening because of frequent urination.  Patient Active Problem List   Diagnosis Date Noted  . Hyperlipidemia 09/17/2019  . COPD mixed type ([place]) 04/17/2019  . Tobacco user 04/17/2019  . Nocturnal hypoxemia 12/25/2018  . Change in bowel habits 03/14/2018  . Chest pain 01/26/2016  . Hallucination, drug-induced ([place]) 03/12/2015  . Hypothyroidism  03/12/2015  . Lap gastric bypass June 2016 11/25/2014  . Diabetes mellitus with neuropathy ([place]) 06/20/2013  . Chronic pain of right knee 06/20/2013  . Morbid obesity ([place]) 07/05/2012     Current Outpatient Medications on File Prior to Visit  Medication Sig Dispense Refill  . albuterol (PROVENTIL HFA;VENTOLIN HFA) 108 (90 BASE) MCG/ACT inhaler Inhale 2 puffs into the lungs every 6 (six) hours as needed for wheezing or shortness of breath. Shortness of breath    . ALPRAZolam (XANAX) 1 MG tablet Take 1 mg by mouth 4 (four) times daily.   0  . amLODipine (NORVASC) 5 MG tablet Take 1 tablet (5 mg total) by mouth daily. 90 tablet 1  . amphetamine-dextroamphetamine (ADDERALL XR) 30 MG 24 hr capsule Take 30 mg by mouth daily.     Marland Kitchen amphetamine-dextroamphetamine (ADDERALL) 20 MG tablet Take 10 mg by mouth every evening. Pt. Is taking 63m in the evening.  0  . Blood Glucose Monitoring Suppl (ACCU-CHEK GUIDE) w/Device KIT 1 kit by Does not apply route 3 (three) times daily. To check blood sugars 1 kit 0  . cariprazine (VRAYLAR) capsule 3 mg daily. 4.5 mg daily    . CHANTIX STARTING MONTH PAK 0.5 MG X 11 & 1 MG X 42 tablet     . Cyanocobalamin (VITAMIN B 12 PO) Take 1 tablet by mouth daily.     . cyclobenzaprine (FLEXERIL) 10 MG tablet Take 1/2 to 1 whole tablet by mouth every 8 hours as needed for muscle pain/spasms. 20 tablet 0  . doxepin (SINEQUAN) 75 MG capsule Take 75 mg by mouth at bedtime.  0  . EPINEPHrine 0.3 mg/0.3 mL IJ SOAJ injection Inject 0.3 mg as directed as needed (severe allergic reaction).     .Marland Kitchen  Evolocumab (REPATHA SURECLICK) 591 MG/ML SOAJ Inject 1 pen into the skin every 14 (fourteen) days. 2 pen 11  . glucose blood (ACCU-CHEK GUIDE) test strip Use as instructed 100 each 12  . levothyroxine (SYNTHROID) 112 MCG tablet Take 1 tablet (112 mcg total) by mouth daily. 90 tablet 1  . lidocaine-prilocaine (EMLA) cream Apply 1 application topically as needed. 30 g 0  . linaclotide  (LINZESS) 290 MCG CAPS capsule Take 290 mcg by mouth at bedtime.    . metoCLOPramide (REGLAN) 10 MG tablet Take 1 tablet (10 mg total) by mouth every 8 (eight) hours as needed for nausea. 30 tablet 0  . metoprolol tartrate (LOPRESSOR) 100 MG tablet Take one tablet by mouth 2 hours prior to CT 1 tablet 0  . morphine (MS CONTIN) 15 MG 12 hr tablet Take 1 tablet (15 mg total) by mouth every 12 (twelve) hours. 60 tablet 0  . Multiple Vitamin (MULTIVITAMIN) tablet Take 1 tablet by mouth every morning.     . nabumetone (RELAFEN) 750 MG tablet Take 750 mg by mouth 2 (two) times daily.    . nitroGLYCERIN (NITROSTAT) 0.4 MG SL tablet DISSOLVE 1 TABLET UNDER THE TONGUE EVERY 5 MINUTES AS NEEDED 25 tablet 3  . olmesartan-hydrochlorothiazide (BENICAR HCT) 20-12.5 MG tablet Take 1 tablet by mouth daily. 90 tablet 1  . omeprazole (PRILOSEC) 40 MG capsule Take 1 capsule (40 mg total) by mouth daily. 90 capsule 1  . Oxycodone HCl 10 MG TABS Take 1 tablet (10 mg total) by mouth every 6 (six) hours as needed (for pain). 120 tablet 0  . pregabalin (LYRICA) 100 MG capsule Take 1 capsule (100 mg total) by mouth 3 (three) times daily. 90 capsule 1  . tiZANidine (ZANAFLEX) 2 MG tablet Take 2 mg by mouth every 8 (eight) hours as needed.    . umeclidinium-vilanterol (ANORO ELLIPTA) 62.5-25 MCG/INH AEPB Inhale 1 puff into the lungs daily. 1 each 0  . VOLTAREN 1 % GEL Apply 4 g topically 4 (four) times daily. 100 g 11  . vortioxetine HBr (TRINTELLIX) 10 MG TABS tablet 10 mg daily.    . [DISCONTINUED] escitalopram (LEXAPRO) 20 MG tablet Take 20 mg by mouth daily.    . [DISCONTINUED] LATUDA 120 MG TABS Take 120 mg by mouth at bedtime.   2  . [DISCONTINUED] LISINOPRIL PO Take by mouth daily.       No current facility-administered medications on file prior to visit.    Allergies  Allergen Reactions  . Aspirin Anaphylaxis and Other (See Comments)    Pt states she has had Toradol several times without any reactions  . Bee  Venom Anaphylaxis  . Sulfa Antibiotics Anaphylaxis  . Other Other (See Comments)    No blood products.  Patient did  Request that only albumin or albumin-containing products may be administered    Social History   Socioeconomic History  . Marital status: Single    Spouse name: Not on file  . Number of children: Not on file  . Years of education: Not on file  . Highest education level: Not on file  Occupational History  . Not on file  Tobacco Use  . Smoking status: Former Smoker    Packs/day: 0.25    Years: 43.00    Pack years: 10.75    Types: Cigarettes    Quit date: 2020    Years since quitting: 1.4  . Smokeless tobacco: Never Used  Vaping Use  . Vaping Use: Never used  Substance and Sexual Activity  . Alcohol use: Yes    Comment: social  . Drug use: No  . Sexual activity: Yes    Birth control/protection: Surgical  Other Topics Concern  . Not on file  Social History Narrative  . Not on file   Social Determinants of Health   Financial Resource Strain:   . Difficulty of Paying Living Expenses:   Food Insecurity:   . Worried About Charity fundraiser in the Last Year:   . Arboriculturist in the Last Year:   Transportation Needs:   . Film/video editor (Medical):   Marland Kitchen Lack of Transportation (Non-Medical):   Physical Activity:   . Days of Exercise per Week:   . Minutes of Exercise per Session:   Stress:   . Feeling of Stress :   Social Connections:   . Frequency of Communication with Friends and Family:   . Frequency of Social Gatherings with Friends and Family:   . Attends Religious Services:   . Active Member of Clubs or Organizations:   . Attends Archivist Meetings:   Marland Kitchen Marital Status:   Intimate Partner Violence:   . Fear of Current or Ex-Partner:   . Emotionally Abused:   Marland Kitchen Physically Abused:   . Sexually Abused:     Family History  Problem Relation Age of Onset  . Cancer Father        colon  . Stroke Father   . Heart disease  Father   . Diabetes Father   . Hypertension Father   . Depression Sister   . Hypertension Mother   . Breast cancer Neg Hx     Past Surgical History:  Procedure Laterality Date  . ABDOMINAL HYSTERECTOMY     partial  . APPENDECTOMY    . BIOPSY  03/14/2018   Procedure: BIOPSY;  Surgeon: Wilford Corner, MD;  Location: [facility];  Service: Endoscopy;;  . BREAST DUCTAL SYSTEM EXCISION Right 08/31/2017   Procedure: RIGHT BREAST CENTRAL DUCT EXCISION;  Surgeon: Erroll Luna, MD;  Location: [facility];  Service: General;  Laterality: Right;  . BREAST EXCISIONAL BIOPSY Right   . BREAST LUMPECTOMY     right  . CARDIAC CATHETERIZATION     no significant CAD, nl LV function by 11/08/06 cath  . CESAREAN SECTION     x4  . COLONOSCOPY WITH PROPOFOL N/A 03/14/2018   Procedure: COLONOSCOPY WITH PROPOFOL;  Surgeon: Wilford Corner, MD;  Location: [facility];  Service: Endoscopy;  Laterality: N/A;  . GASTRIC ROUX-EN-Y N/A 11/25/2014   Procedure: LAPAROSCOPIC ROUX-EN-Y GASTRIC BYPASS WITH UPPER ENDOSCOPY;  Surgeon: Johnathan Hausen, MD;  Location: [facility];  Service: General;  Laterality: N/A;  . HERNIA REPAIR    . JOINT REPLACEMENT    . KNEE SURGERY     right  . MOUTH SURGERY    . [place]  . POLYPECTOMY  03/14/2018   Procedure: POLYPECTOMY;  Surgeon: Wilford Corner, MD;  Location: [facility];  Service: Endoscopy;;  . TOTAL KNEE REVISION  06/21/2012   Procedure: TOTAL KNEE REVISION;  Surgeon: Newt Minion, MD;  Location: [facility];  Service: Orthopedics;  Laterality: Right;  Revision Right Total Knee Arthroplasty    ROS: Review of Systems Negative except as stated above  PHYSICAL EXAM: Vitals with BMI 12/06/2019 11/12/2019 10/05/2019  Height 5' 5"  5' 5"  -  Weight 227 lbs 221 lbs -  BMI 53.00 51.10 -  Systolic 211 173  496  Diastolic 86 80 86  Pulse 64 83 -  SpO2- 98%, room air  Temperature- 98.15F, oral  Physical Exam General appearance -  alert, well appearing, and in no distress and oriented to person, place, and time Mental status - alert, oriented to person, place, and time, normal mood, behavior, speech, dress, motor activity, and thought processes Neck - supple, no significant adenopathy Lymphatics - no palpable lymphadenopathy, no hepatosplenomegaly Chest - clear to auscultation, no wheezes, rales or rhonchi, symmetric air entry, no tachypnea, retractions or cyanosis Heart - normal rate, regular rhythm, normal S1, S2, no murmurs, rubs, clicks or gallops Pelvic - exam declined by the patient  Results for orders placed or performed in visit on 12/06/19  POCT URINALYSIS DIP (CLINITEK)  Result Value Ref Range   Color, UA yellow yellow   Clarity, UA cloudy (A) clear   Glucose, UA negative negative mg/dL   Bilirubin, UA negative negative   Ketones, POC UA negative negative mg/dL   Spec Grav, UA 1.025 1.010 - 1.025   Blood, UA trace-intact (A) negative   pH, UA 6.0 5.0 - 8.0   POC PROTEIN,UA negative negative, trace   Urobilinogen, UA 0.2 0.2 or 1.0 E.U./dL   Nitrite, UA Negative Negative   Leukocytes, UA Trace (A) Negative  Glucose (CBG)  Result Value Ref Range   POC Glucose 101 (A) 70 - 99 mg/dl  POCT glycosylated hemoglobin (Hb A1C)  Result Value Ref Range   Hemoglobin A1C 6.1 (A) 4.0 - 5.6 %   HbA1c POC (<> result, manual entry)     HbA1c, POC (prediabetic range)     HbA1c, POC (controlled diabetic range)      ASSESSMENT AND PLAN: 1. Urinary incontinence, unspecified: -Today will complete a urinalysis dip to screen for UTI. Patient with trace red blood cells on urinalysis dip and will send remaining urine out for urinalysis with reflex. -Patient declines testing for sexually transmitted infections. -Patient requesting incontinence supplies. - POCT URINALYSIS DIP (CLINITEK) - Urinalysis, Routine w reflex microscopic  2. Type 2 diabetes mellitus with chronic painful diabetic neuropathy [facility]): -Patient had  concerns about frequent urination being the result of poorly controlled diabetes.  -Today A1C 6.1 and CBG 101. It is unlikely that diabetes is the cause of frequent urination as A1C and CBG are at goal. Patient is not prescribed anti-diabetic medications.  -Counseled patient to continue to monitor blood sugars at home.  -To achieve an A1C goal of less than or equal to 7.0 percent, a fasting blood sugar of 80 to 130 mg/dL and a postprandial glucose (90 to 120 minutes after a meal) less than 180 mg/dL. In the event of sugars less than 60 mg/dl or greater than 400 mg/dl please notify the clinic ASAP. It is recommended that you undergo annual eye exams and annual foot exams. -Discussed the importance of healthy eating habits, low-carbohydrate diet, low-sugar diet, regular aerobic exercise (at least 150 minutes a week as tolerated) and medication compliance to achieve or maintain control of diabetes. -Diabetes may be a contributing factor to urinary incontinence. Patient requesting incontinence supplies. -Follow-up with primary physician in 3 months or sooner if needed. - Glucose (CBG) - POCT glycosylated hemoglobin (Hb A1C)  3. Pure hypercholesterolemia: -Patients last update on 07/17/2019 by primary physician noted patient with pure hypercholesterolemia. At that time patient was notified of her lab results and requested to be on [place]. Patient was then referred to Cardiology to determine if medication is warranted.  -On 11/21/2019  patient was contacted by the office of [facility] in attempts to set an appointment for lipids without success.  -Today will collect lipid panel and direct LDL cholesterol to evaluate cholesterol per request of Dr. Prentiss Bells with Cardiology. -Counseled patient to keep appointments with Cardiology. -Follow-up with primary physician as needed. - Lipid Panel - LDL Cholesterol, Direct   Patient was given the opportunity to ask questions.  Patient verbalized understanding of the  plan and was able to repeat key elements of the plan. Patient was given clear instructions to go to Emergency Department or return to medical center if symptoms don't improve, worsen, or new problems develop.The patient verbalized understanding.   Camillia Herter, NP

## 2019-12-07 ENCOUNTER — Other Ambulatory Visit: Payer: Self-pay | Admitting: Internal Medicine

## 2019-12-07 ENCOUNTER — Telehealth: Payer: Self-pay | Admitting: Family Medicine

## 2019-12-07 LAB — LIPID PANEL
Chol/HDL Ratio: 2.5 ratio (ref 0.0–4.4)
Cholesterol, Total: 215 mg/dL — ABNORMAL HIGH (ref 100–199)
HDL: 87 mg/dL (ref >39)
LDL Chol Calc (NIH): 110 mg/dL — ABNORMAL HIGH (ref 0–99)
Triglycerides: 107 mg/dL (ref 0–149)
VLDL Cholesterol Cal: 18 mg/dL (ref 5–40)

## 2019-12-07 LAB — MICROSCOPIC EXAMINATION
Casts: NONE SEEN /lpf
RBC, Urine: NONE SEEN /hpf (ref 0–2)

## 2019-12-07 LAB — URINALYSIS, ROUTINE W REFLEX MICROSCOPIC
Bilirubin, UA: NEGATIVE
Glucose, UA: NEGATIVE
Ketones, UA: NEGATIVE
Nitrite, UA: POSITIVE — AB
Protein,UA: NEGATIVE
RBC, UA: NEGATIVE
Specific Gravity, UA: 1.016 (ref 1.005–1.030)
Urobilinogen, Ur: 0.2 mg/dL (ref 0.2–1.0)
pH, UA: 6 (ref 5.0–7.5)

## 2019-12-07 MED ORDER — CIPROFLOXACIN HCL 500 MG PO TABS
500.0000 mg | ORAL_TABLET | Freq: Two times a day (BID) | ORAL | 0 refills | Status: DC
Start: 2019-12-07 — End: 2020-12-11

## 2019-12-07 MED ORDER — FLUCONAZOLE 150 MG PO TABS: 150.0000 mg | ORAL_TABLET | Freq: Once | ORAL | 0 refills | Status: AC

## 2019-12-07 NOTE — Progress Notes (Unsigned)
cipro

## 2019-12-07 NOTE — Progress Notes (Signed)
Patient has UTI. Macrobid has been prescribed for this while in clinic.  CBG and A1C discussed in clinic.   Cholesterol high. Recommendations to make lifestyle changes. Your LDL is above normal. The LDL is the bad cholesterol. Over time and in combination with inflammation and other factors, this contributes to plaque which in turn may lead to stroke and/or heart attack down the road. Consider room for improvement in diet and daily exercise as tolerated can bring this number down and potentially reduce one's risk of heart attack and/or stroke.   To reduce your LDL, Remember - more fruits and vegetables, more fish, and limit red meat and dairy products. More soy, nuts, beans, barley, lentils, oats and plant sterol ester enriched margarine instead of butter. I also encourage eliminating sugar and processed food.   Continue Repatha for cholesterol management and follow-up with Cardiology as scheduled.  Diflucan has been sent to pharmacy on file for vaginal itching.

## 2019-12-07 NOTE — Addendum Note (Signed)
Addended by: Camillia Herter on: 12/07/2019 12:52 PM   Modules accepted: Orders

## 2019-12-07 NOTE — Telephone Encounter (Signed)
Patient called in and stated that at yesterdays visit she was supposed to recieve a medication for her yeast infection. Please follow up at your earliest convenience.

## 2019-12-12 ENCOUNTER — Telehealth: Payer: Self-pay | Admitting: Pharmacist

## 2019-12-12 NOTE — Telephone Encounter (Signed)
Called patient to review lipid labs. LDL only dropped 10 points since starting Repatha. Fill hx shows patient is taking. Will need to confirm patient is taking.  Consider adding zetia

## 2019-12-19 ENCOUNTER — Other Ambulatory Visit: Payer: Self-pay | Admitting: Family Medicine

## 2019-12-19 DIAGNOSIS — E039 Hypothyroidism, unspecified: Secondary | ICD-10-CM

## 2019-12-20 NOTE — Telephone Encounter (Signed)
Please schedule patient for a lab appointment.

## 2019-12-29 ENCOUNTER — Other Ambulatory Visit: Payer: Self-pay | Admitting: Family Medicine

## 2019-12-29 DIAGNOSIS — I10 Essential (primary) hypertension: Secondary | ICD-10-CM

## 2019-12-29 NOTE — Telephone Encounter (Signed)
Requested Prescriptions  Pending Prescriptions Disp Refills  . olmesartan-hydrochlorothiazide (BENICAR HCT) 20-12.5 MG tablet [Pharmacy Med Name: OLMESARTAN MEDOX/HCTZ 20-12.5MG  TAB] 90 tablet 1    Sig: TAKE 1 TABLET BY MOUTH DAILY     Cardiovascular: ARB + Diuretic Combos Failed - 12/29/2019 11:53 AM      Failed - Cr in normal range and within 180 days    Creatinine, Ser  Date Value Ref Range Status  09/13/2019 1.17 (H) 0.57 - 1.00 mg/dL Final   Creatinine, Urine  Date Value Ref Range Status  09/13/2014 84.71 >20.0 mg/dL Final         Passed - K in normal range and within 180 days    Potassium  Date Value Ref Range Status  09/13/2019 3.7 3.5 - 5.2 mmol/L Final         Passed - Na in normal range and within 180 days    Sodium  Date Value Ref Range Status  09/13/2019 140 134 - 144 mmol/L Final         Passed - Ca in normal range and within 180 days    Calcium  Date Value Ref Range Status  09/13/2019 9.6 8.7 - 10.2 mg/dL Final   Calcium, Ion  Date Value Ref Range Status  06/29/2018 1.15 1.15 - 1.40 mmol/L Final         Passed - Patient is not pregnant      Passed - Last BP in normal range    BP Readings from Last 1 Encounters:  12/06/19 125/86         Passed - Valid encounter within last 6 months    Recent Outpatient Visits          3 weeks ago Urinary incontinence, unspecified type   [place], [place], NP   4 months ago Neuropathic pain   [place], [place], [place]   5 months ago Type 2 diabetes mellitus with chronic painful diabetic neuropathy [facility])   [facility] Antony Blackbird, MD

## 2020-01-01 MED ORDER — EZETIMIBE 10 MG PO TABS
10.0000 mg | ORAL_TABLET | Freq: Every day | ORAL | 3 refills | Status: DC
Start: 2020-01-01 — End: 2020-04-16

## 2020-01-01 NOTE — Telephone Encounter (Signed)
Spoke with patient about her labs. Did not see LDL reduction we had expected. She had given 4 repatha injections prior to labs. Will continue repatha and start zetia 10mg  daily. Recheck labs in 2-3 months.

## 2020-01-01 NOTE — Addendum Note (Signed)
Addended by: Marcelle Overlie D on: 01/01/2020 03:53 PM   Modules accepted: Orders

## 2020-01-02 ENCOUNTER — Other Ambulatory Visit: Payer: Self-pay | Admitting: Family Medicine

## 2020-01-02 DIAGNOSIS — I10 Essential (primary) hypertension: Secondary | ICD-10-CM

## 2020-01-02 NOTE — Telephone Encounter (Signed)
Requested Prescriptions  Pending Prescriptions Disp Refills  . amLODipine (NORVASC) 5 MG tablet [Pharmacy Med Name: AMLODIPINE BESYLATE 5MG  TABLETS] 90 tablet 0    Sig: TAKE 1 TABLET(5 MG) BY MOUTH DAILY     Cardiovascular:  Calcium Channel Blockers Passed - 01/02/2020 11:45 AM      Passed - Last BP in normal range    BP Readings from Last 1 Encounters:  12/06/19 125/86         Passed - Valid encounter within last 6 months    Recent Outpatient Visits          3 weeks ago Urinary incontinence, unspecified type   [place], [place], NP   4 months ago Neuropathic pain   [place], [place], [place]   6 months ago Type 2 diabetes mellitus with chronic painful diabetic neuropathy [facility])   [facility] Antony Blackbird, MD

## 2020-01-04 NOTE — Telephone Encounter (Signed)
Patient's appt has been scheduled.

## 2020-01-11 ENCOUNTER — Other Ambulatory Visit: Payer: Self-pay

## 2020-01-11 ENCOUNTER — Ambulatory Visit: Payer: Medicare HMO | Attending: Family Medicine

## 2020-01-11 NOTE — Addendum Note (Signed)
Addended by: Camillia Herter on: 01/11/2020 09:26 AM   Modules accepted: Orders

## 2020-01-12 LAB — T4 AND TSH
T4, Total: 5.2 ug/dL (ref 4.5–12.0)
TSH: 18.1 u[IU]/mL — ABNORMAL HIGH (ref 0.450–4.500)

## 2020-01-22 ENCOUNTER — Other Ambulatory Visit: Payer: Self-pay | Admitting: Family Medicine

## 2020-01-22 DIAGNOSIS — K219 Gastro-esophageal reflux disease without esophagitis: Secondary | ICD-10-CM

## 2020-01-29 ENCOUNTER — Telehealth: Payer: Self-pay | Admitting: Family Medicine

## 2020-01-29 NOTE — Telephone Encounter (Signed)
Pl note  That an incoming fax with [facility] is being resent from July when they never received feedback from fax for pt. Resending today 10:05 am 8/17

## 2020-01-29 NOTE — Telephone Encounter (Signed)
Please follow up per request [facility].

## 2020-02-06 ENCOUNTER — Encounter (HOSPITAL_COMMUNITY): Payer: Self-pay | Admitting: Emergency Medicine

## 2020-02-06 ENCOUNTER — Ambulatory Visit (HOSPITAL_COMMUNITY)
Admission: EM | Admit: 2020-02-06 | Discharge: 2020-02-06 | Disposition: A | Discharge status: Home / Self Care | Payer: Medicare HMO | Attending: Emergency Medicine | Admitting: Emergency Medicine

## 2020-02-06 ENCOUNTER — Other Ambulatory Visit: Payer: Self-pay

## 2020-02-06 DIAGNOSIS — M25569 Pain in unspecified knee: Secondary | ICD-10-CM

## 2020-02-06 DIAGNOSIS — N390 Urinary tract infection, site not specified: Secondary | ICD-10-CM

## 2020-02-06 DIAGNOSIS — G8929 Other chronic pain: Secondary | ICD-10-CM | POA: Diagnosis present

## 2020-02-06 DIAGNOSIS — M25561 Pain in right knee: Secondary | ICD-10-CM | POA: Diagnosis present

## 2020-02-06 LAB — POCT URINALYSIS DIPSTICK, ED / UC
Bilirubin Urine: NEGATIVE
Glucose, UA: NEGATIVE mg/dL
Ketones, ur: NEGATIVE mg/dL
Nitrite: POSITIVE — AB
Protein, ur: NEGATIVE mg/dL
Specific Gravity, Urine: 1.02 (ref 1.005–1.030)
Urobilinogen, UA: 0.2 mg/dL (ref 0.0–1.0)
pH: 6 (ref 5.0–8.0)

## 2020-02-06 MED ORDER — CEPHALEXIN 500 MG PO CAPS
500.0000 mg | ORAL_CAPSULE | Freq: Two times a day (BID) | ORAL | 0 refills | Status: AC
Start: 2020-02-06 — End: 2020-02-13

## 2020-02-06 MED ORDER — PREDNISONE 10 MG (21) PO TBPK
ORAL_TABLET | Freq: Every day | ORAL | 0 refills | Status: DC
Start: 2020-02-06 — End: 2020-05-12

## 2020-02-06 MED ORDER — KETOROLAC TROMETHAMINE 30 MG/ML IJ SOLN
30.0000 mg | Freq: Once | INTRAMUSCULAR | Status: AC
  Administered 2020-02-06: 30 mg via INTRAMUSCULAR

## 2020-02-06 MED ORDER — KETOROLAC TROMETHAMINE 30 MG/ML IJ SOLN
INTRAMUSCULAR | Status: AC
  Filled 2020-02-06: qty 1

## 2020-02-06 NOTE — ED Triage Notes (Signed)
Pt presents to [facility] for assessment of urinary urgency and frequency x 2 weeks.  Attempted AZO without relief.  Also c/o chronic right knee pain which she has to come and get a toradol shot for from time to time

## 2020-02-06 NOTE — Discharge Instructions (Signed)
Complete course of antibiotics.  Please follow up with your orthopedist for long term management of your knee

## 2020-02-06 NOTE — ED Provider Notes (Signed)
[place]    CSN: [id number] Arrival date & time: 02/06/20  4259      History   Chief Complaint Chief Complaint  Patient presents with  . Dysuria  . Knee Pain    HPI Caroline Williams is a 59 y.o. female.   Caroline Williams presents with complaints of acute on chronic right knee pain. History of knee replacement and revision- 2014. Has had issues since. Taking oxycodone and morphine regularly. Occasionally has a flare of pain in which toradol IM is helpful, as well as prednisone. a1c last was 6.0 per patient. Pain started 3 days ago. Similar episode in May of this year. Still follows with orthopedics. No new injury. Redness and swelling intermittently. She is not necessarily on her feet a lot. Weight bearing increases pain. Pain with turning in bed even. She gets injections to the knee every 3 months, last was <3 months ago.  Also with complaints of two weeks of urinary urgency, some incontinence and pain with urination. Took Azo which helped only for 1 day. No blood in urine. No gi symptoms. No back pain. Slight pelvic pain with urination. Has had UTI's in the past. History of DM.    ROS per HPI, negative if not otherwise mentioned.      Past Medical History:  Diagnosis Date  . Anxiety   . Arthritis   . Asthma   . Bipolar 1 disorder ([place])   . Breast discharge   . Breast lump   . Breast pain   . Chronic pain    on MS Contin and oxycodone  . Depression   . Diabetes mellitus    diet and exercise controlled  . Escherichia coli (E. coli) infection   . Fever   . GERD (gastroesophageal reflux disease)   . History of chest pain   . History of kidney stones   . History of knee replacement, total   . Hypertension   . Hypothyroidism   . N&V (nausea and vomiting)   . Peripheral neuropathy    Right foot no sensation  . Pneumonia    2015,2014  . Sleep apnea    does not use CPAP  . Thyroid disease   . Wears glasses     Patient Active Problem List    Diagnosis Date Noted  . Hyperlipidemia 09/17/2019  . COPD mixed type ([place]) 04/17/2019  . Tobacco user 04/17/2019  . Nocturnal hypoxemia 12/25/2018  . Change in bowel habits 03/14/2018  . Chest pain 01/26/2016  . Hallucination, drug-induced ([place]) 03/12/2015  . Hypothyroidism 03/12/2015  . Lap gastric bypass June 2016 11/25/2014  . Diabetes mellitus with neuropathy ([place]) 06/20/2013  . Chronic pain of right knee 06/20/2013  . Morbid obesity ([place]) 07/05/2012    Past Surgical History:  Procedure Laterality Date  . ABDOMINAL HYSTERECTOMY     partial  . APPENDECTOMY    . BIOPSY  03/14/2018   Procedure: BIOPSY;  Surgeon: Wilford Corner, MD;  [facility];  Service: Endoscopy;;  . BREAST DUCTAL SYSTEM EXCISION Right 08/31/2017   Procedure: RIGHT BREAST CENTRAL DUCT EXCISION;  Surgeon: Erroll Luna, MD;  Location: [facility];  Service: General;  Laterality: Right;  . BREAST EXCISIONAL BIOPSY Right   . BREAST LUMPECTOMY     right  . CARDIAC CATHETERIZATION     no significant CAD, nl LV function by 11/08/06 cath  . CESAREAN SECTION     x4  . COLONOSCOPY WITH PROPOFOL N/A 03/14/2018  Procedure: COLONOSCOPY WITH PROPOFOL;  Surgeon: Wilford Corner, MD;  [facility];  Service: Endoscopy;  Laterality: N/A;  . GASTRIC ROUX-EN-Y N/A 11/25/2014   Procedure: LAPAROSCOPIC ROUX-EN-Y GASTRIC BYPASS WITH UPPER ENDOSCOPY;  Surgeon: Johnathan Hausen, MD;  [facility]: [facility];  Service: General;  Laterality: N/A;  . HERNIA REPAIR    . JOINT REPLACEMENT    . KNEE SURGERY     right  . MOUTH SURGERY    . [place]  . POLYPECTOMY  03/14/2018   Procedure: POLYPECTOMY;  Surgeon: Wilford Corner, MD;  [facility];  Service: Endoscopy;;  . TOTAL KNEE REVISION  06/21/2012   Procedure: TOTAL KNEE REVISION;  Surgeon: Newt Minion, MD;  Location: [facility];  Service: Orthopedics;  Laterality: Right;  Revision Right Total Knee Arthroplasty     OB History   No obstetric history on file.      Home Medications    Prior to Admission medications   Medication Sig Start Date End Date Taking? Authorizing Provider  albuterol (PROVENTIL HFA;VENTOLIN HFA) 108 (90 BASE) MCG/ACT inhaler Inhale 2 puffs into the lungs every 6 (six) hours as needed for wheezing or shortness of breath. Shortness of breath    [provider]  ALPRAZolam (XANAX) 1 MG tablet Take 1 mg by mouth 4 (four) times daily.  07/02/14   [provider]  amLODipine (NORVASC) 5 MG tablet TAKE 1 TABLET(5 MG) BY MOUTH DAILY 01/02/20   Fulp, Cammie, MD  amphetamine-dextroamphetamine (ADDERALL XR) 30 MG 24 hr capsule Take 30 mg by mouth daily.  03/10/18   [provider]  amphetamine-dextroamphetamine (ADDERALL) 20 MG tablet Take 10 mg by mouth every evening. Pt. Is taking 38m in the evening. 10/13/17   [provider]  Blood Glucose Monitoring Suppl (ACCU-CHEK GUIDE) w/Device KIT 1 kit by Does not apply route 3 (three) times daily. To check blood sugars 07/05/19   Fulp, Cammie, MD  cariprazine (VRAYLAR) capsule 3 mg daily. 4.5 mg daily    [provider]  cephALEXin (KEFLEX) 500 MG capsule Take 1 capsule (500 mg total) by mouth 2 (two) times daily for 7 days. 02/06/20 02/13/20  BZigmund Gottron NP  CHANTIX STARTING MONTH PAK 0.5 MG X 11 & 1 MG X 42 tablet  04/03/19   [provider]  ciprofloxacin (CIPRO) 500 MG tablet Take 1 tablet (500 mg total) by mouth 2 (two) times daily. 12/07/19   JLadell Pier MD  Cyanocobalamin (VITAMIN B 12 PO) Take 1 tablet by mouth daily.     [provider]  cyclobenzaprine (FLEXERIL) 10 MG tablet Take 1/2 to 1 whole tablet by mouth every 8 hours as needed for muscle pain/spasms. 07/26/18   AKaty Apo NP  doxepin (SINEQUAN) 75 MG capsule Take 75 mg by mouth at bedtime. 03/02/18   [provider]  EPINEPHrine 0.3 mg/0.3 mL IJ SOAJ injection Inject 0.3 mg as directed as needed  (severe allergic reaction).  01/11/14   [provider]  Evolocumab (REPATHA SURECLICK) 1374MG/ML SOAJ Inject 1 pen into the skin every 14 (fourteen) days. 09/17/19   SBelva Crome MD  ezetimibe (ZETIA) 10 MG tablet Take 1 tablet (10 mg total) by mouth daily. 01/01/20   SBelva Crome MD  glucose blood (ACCU-CHEK GUIDE) test strip Use as instructed 07/05/19   Fulp, CAnder Gaster MD  levothyroxine (SYNTHROID) 112 MCG tablet TAKE 1 TABLET EVERY DAY 12/20/19   Fulp, Cammie, MD  lidocaine-prilocaine (EMLA)  cream Apply 1 application topically as needed. Patient not taking: Reported on 12/06/2019 01/29/19   Vanessa Kick, MD  linaclotide Rolan Lipa) 290 MCG CAPS capsule Take 290 mcg by mouth at bedtime.    [provider]  metoCLOPramide (REGLAN) 10 MG tablet Take 1 tablet (10 mg total) by mouth every 8 (eight) hours as needed for nausea. Patient not taking: Reported on 12/06/2019 06/30/18   Mesner, Corene Cornea, MD  metoprolol tartrate (LOPRESSOR) 100 MG tablet Take one tablet by mouth 2 hours prior to CT 08/27/19   Belva Crome, MD  morphine (MS CONTIN) 15 MG 12 hr tablet Take 1 tablet (15 mg total) by mouth every 12 (twelve) hours. 11/14/14   Bayard Hugger, NP  Multiple Vitamin (MULTIVITAMIN) tablet Take 1 tablet by mouth every morning.     [provider]  nabumetone (RELAFEN) 750 MG tablet Take 750 mg by mouth 2 (two) times daily. 08/08/19   [provider]  nitroGLYCERIN (NITROSTAT) 0.4 MG SL tablet DISSOLVE 1 TABLET UNDER THE TONGUE EVERY 5 MINUTES AS NEEDED 10/08/19   Belva Crome, MD  olmesartan-hydrochlorothiazide (BENICAR HCT) 20-12.5 MG tablet TAKE 1 TABLET BY MOUTH DAILY 12/29/19   Fulp, Cammie, MD  omeprazole (PRILOSEC) 40 MG capsule TAKE 1 CAPSULE(40 MG) BY MOUTH DAILY 01/22/20   Fulp, Cammie, MD  Oxycodone HCl 10 MG TABS Take 1 tablet (10 mg total) by mouth every 6 (six) hours as needed (for pain). 11/14/14   Bayard Hugger, NP  predniSONE (STERAPRED UNI-PAK 21 TAB) 10 MG  (21) TBPK tablet Take by mouth daily. Per box instruction 02/06/20   Augusto Gamble B, NP  pregabalin (LYRICA) 100 MG capsule Take 1 capsule (100 mg total) by mouth 3 (three) times daily. 08/08/19   Argentina Donovan, PA-C  tiZANidine (ZANAFLEX) 2 MG tablet Take 2 mg by mouth every 8 (eight) hours as needed. 03/12/19   [provider]  umeclidinium-vilanterol (ANORO ELLIPTA) 62.5-25 MCG/INH AEPB Inhale 1 puff into the lungs daily. 04/17/19   Baird Lyons D, MD  VOLTAREN 1 % GEL Apply 4 g topically 4 (four) times daily. 12/25/17   Tereasa Coop, PA-C  vortioxetine HBr (TRINTELLIX) 10 MG TABS tablet 10 mg daily.    [provider]  escitalopram (LEXAPRO) 20 MG tablet Take 20 mg by mouth daily.  12/14/18  [provider]  LATUDA 120 MG TABS Take 120 mg by mouth at bedtime.  03/04/18 12/14/18  [provider]  LISINOPRIL PO Take by mouth daily.    09/06/11  [provider]    Family History Family History  Problem Relation Age of Onset  . Cancer Father        colon  . Stroke Father   . Heart disease Father   . Diabetes Father   . Hypertension Father   . Depression Sister   . Hypertension Mother   . Breast cancer Neg Hx     Social History Social History   Tobacco Use  . Smoking status: Former Smoker    Packs/day: 0.25    Years: 43.00    Pack years: 10.75    Types: Cigarettes    Quit date: 2020    Years since quitting: 1.6  . Smokeless tobacco: Never Used  Vaping Use  . Vaping Use: Never used  Substance Use Topics  . Alcohol use: Yes    Comment: social  . Drug use: No     Allergies   Aspirin, Bee venom, Sulfa  antibiotics, and Other   Review of Systems Review of Systems   Physical Exam Triage Vital Signs ED Triage Vitals  Enc Vitals Group     BP 02/06/20 1052 (!) 167/88     Pulse Rate 02/06/20 1049 64     Resp 02/06/20 1049 16     Temp 02/06/20 1049 98.5 F (36.9 C)     Temp Source 02/06/20 1049 Oral     SpO2 02/06/20  1049 99 %     Weight --      Height --      Head Circumference --      Peak Flow --      Pain Score 02/06/20 1049 8     Pain Loc --      Pain Edu? --      Excl. in [place]? --    No data found.  Updated Vital Signs BP (!) 167/88 (BP Location: Right Arm)   Pulse 64   Temp 98.5 F (36.9 C) (Oral)   Resp 16   SpO2 99%   Visual Acuity Right Eye Distance:   Left Eye Distance:   Bilateral Distance:    Right Eye Near:   Left Eye Near:    Bilateral Near:     Physical Exam Constitutional:      General: She is not in acute distress.    Appearance: She is well-developed.  Cardiovascular:     Rate and Rhythm: Normal rate.  Pulmonary:     Effort: Pulmonary effort is normal.  Abdominal:     Tenderness: There is no abdominal tenderness. There is no right CVA tenderness or left CVA tenderness.  Musculoskeletal:     Comments: Right knee in brace, ambulatory with cane; no redness or warmth; scarring midline right knee; medial knee with tenderness   Skin:    General: Skin is warm and dry.  Neurological:     Mental Status: She is alert and oriented to person, place, and time.      UC Treatments / Results  Labs (all labs ordered are listed, but only abnormal results are displayed) Labs Reviewed  POCT URINALYSIS DIPSTICK, ED / UC - Abnormal; Notable for the following components:      Result Value   Hgb urine dipstick SMALL (*)    Nitrite POSITIVE (*)    Leukocytes,Ua TRACE (*)    All other components within normal limits  URINE CULTURE    EKG   Radiology No results found.  Procedures Procedures (including critical care time)  Medications Ordered in UC Medications  ketorolac (TORADOL) 30 MG/ML injection 30 mg (30 mg Intramuscular Given 02/06/20 1126)    Initial Impression / Assessment and Plan / UC Course  I have reviewed the triage vital signs and the nursing notes.  Pertinent labs & imaging results that were available during my care of the patient were reviewed by  me and considered in my medical decision making (see chart for details).     Acute on chronic knee pain. toradol x1 and prednisone pack provided, continue to follow with ortho and pcp for chronic pain. Keflex initiated for UTI with culture pending. Return precautions provided. Patient verbalized understanding and agreeable to plan.   Final Clinical Impressions(s) / UC Diagnoses   Final diagnoses:  Lower urinary tract infectious disease  Chronic pain of right knee     Discharge Instructions     Complete course of antibiotics.  Please follow up with your orthopedist for long term management of your  knee     ED Prescriptions    Medication Sig Dispense Auth. Provider   cephALEXin (KEFLEX) 500 MG capsule Take 1 capsule (500 mg total) by mouth 2 (two) times daily for 7 days. 14 capsule Devin Foskey B, NP   predniSONE (STERAPRED UNI-PAK 21 TAB) 10 MG (21) TBPK tablet Take by mouth daily. Per box instruction 21 tablet Zigmund Gottron, NP     PDMP not reviewed this encounter.   Zigmund Gottron, NP 02/06/20 1136

## 2020-02-08 LAB — URINE CULTURE: Culture: >=100000 — AB

## 2020-02-13 DIAGNOSIS — R030 Elevated blood-pressure reading, without diagnosis of hypertension: Secondary | ICD-10-CM | POA: Insufficient documentation

## 2020-02-14 DIAGNOSIS — G8929 Other chronic pain: Secondary | ICD-10-CM | POA: Insufficient documentation

## 2020-02-19 ENCOUNTER — Other Ambulatory Visit: Payer: Self-pay

## 2020-02-19 ENCOUNTER — Encounter (HOSPITAL_COMMUNITY): Payer: Self-pay

## 2020-02-19 ENCOUNTER — Other Ambulatory Visit: Payer: Self-pay | Admitting: Physician Assistant

## 2020-02-19 ENCOUNTER — Ambulatory Visit (HOSPITAL_COMMUNITY)
Admission: EM | Admit: 2020-02-19 | Discharge: 2020-02-19 | Disposition: A | Discharge status: Home / Self Care | Payer: Medicare HMO | Attending: Physician Assistant | Admitting: Physician Assistant

## 2020-02-19 DIAGNOSIS — N6452 Nipple discharge: Secondary | ICD-10-CM

## 2020-02-19 DIAGNOSIS — N6310 Unspecified lump in the right breast, unspecified quadrant: Secondary | ICD-10-CM

## 2020-02-19 DIAGNOSIS — N63 Unspecified lump in unspecified breast: Secondary | ICD-10-CM

## 2020-02-19 MED ORDER — DOXYCYCLINE HYCLATE 100 MG PO CAPS: 100.0000 mg | ORAL_CAPSULE | Freq: Two times a day (BID) | ORAL | 0 refills | Status: AC

## 2020-02-19 NOTE — ED Provider Notes (Signed)
[place]    CSN: [id number] Arrival date & time: 02/19/20  1247      History   Chief Complaint Chief Complaint  Patient presents with  . Breast Pain    HPI Caroline Williams is a 59 y.o. female.   Patient reports urgent care for right breast lump.  She reports is just next to her nipple.  Reports is been present for a few days and there has been some drainage.  She also notes drainage from her nipple.  She reports is a little bit painful.  She reports she has had similar before requiring an ultrasound and drainage.  She reports she attempted to schedule her with breast center however they required a referral.  She has not contacted her primary care about this.  She has not had fever but felt a little chilled.     Past Medical History:  Diagnosis Date  . Anxiety   . Arthritis   . Asthma   . Bipolar 1 disorder ([place])   . Breast discharge   . Breast lump   . Breast pain   . Chronic pain    on MS Contin and oxycodone  . Depression   . Diabetes mellitus    diet and exercise controlled  . Escherichia coli (E. coli) infection   . Fever   . GERD (gastroesophageal reflux disease)   . History of chest pain   . History of kidney stones   . History of knee replacement, total   . Hypertension   . Hypothyroidism   . N&V (nausea and vomiting)   . Peripheral neuropathy    Right foot no sensation  . Pneumonia    2015,2014  . Sleep apnea    does not use CPAP  . Thyroid disease   . Wears glasses     Patient Active Problem List   Diagnosis Date Noted  . Hyperlipidemia 09/17/2019  . COPD mixed type ([place]) 04/17/2019  . Tobacco user 04/17/2019  . Nocturnal hypoxemia 12/25/2018  . Change in bowel habits 03/14/2018  . Chest pain 01/26/2016  . Hallucination, drug-induced ([place]) 03/12/2015  . Hypothyroidism 03/12/2015  . Lap gastric bypass June 2016 11/25/2014  . Diabetes mellitus with neuropathy ([place]) 06/20/2013  . Chronic pain of right knee 06/20/2013  . Morbid  obesity ([place]) 07/05/2012    Past Surgical History:  Procedure Laterality Date  . ABDOMINAL HYSTERECTOMY     partial  . APPENDECTOMY    . BIOPSY  03/14/2018   Procedure: BIOPSY;  Surgeon: Wilford Corner, MD;  [facility];  Service: Endoscopy;;  . BREAST DUCTAL SYSTEM EXCISION Right 08/31/2017   Procedure: RIGHT BREAST CENTRAL DUCT EXCISION;  Surgeon: Erroll Luna, MD;  Location: [facility];  Service: General;  Laterality: Right;  . BREAST EXCISIONAL BIOPSY Right   . BREAST LUMPECTOMY     right  . CARDIAC CATHETERIZATION     no significant CAD, nl LV function by 11/08/06 cath  . CESAREAN SECTION     x4  . COLONOSCOPY WITH PROPOFOL N/A 03/14/2018   Procedure: COLONOSCOPY WITH PROPOFOL;  Surgeon: Wilford Corner, MD;  [facility];  Service: Endoscopy;  Laterality: N/A;  . GASTRIC ROUX-EN-Y N/A 11/25/2014   Procedure: LAPAROSCOPIC ROUX-EN-Y GASTRIC BYPASS WITH UPPER ENDOSCOPY;  Surgeon: Johnathan Hausen, MD;  [facility];  Service: General;  Laterality: N/A;  . HERNIA REPAIR    . JOINT REPLACEMENT    . KNEE SURGERY     right  .  MOUTH SURGERY    . [place]  . POLYPECTOMY  03/14/2018   Procedure: POLYPECTOMY;  Surgeon: Wilford Corner, MD;  [facility];  Service: Endoscopy;;  . TOTAL KNEE REVISION  06/21/2012   Procedure: TOTAL KNEE REVISION;  Surgeon: Newt Minion, MD;  Location: [facility];  Service: Orthopedics;  Laterality: Right;  Revision Right Total Knee Arthroplasty    OB History   No obstetric history on file.      Home Medications    Prior to Admission medications   Medication Sig Start Date End Date Taking? Authorizing Provider  albuterol (PROVENTIL HFA;VENTOLIN HFA) 108 (90 BASE) MCG/ACT inhaler Inhale 2 puffs into the lungs every 6 (six) hours as needed for wheezing or shortness of breath. Shortness of breath    [provider]  ALPRAZolam (XANAX) 1 MG tablet Take 1 mg by mouth 4  (four) times daily.  07/02/14   [provider]  amLODipine (NORVASC) 5 MG tablet TAKE 1 TABLET(5 MG) BY MOUTH DAILY 01/02/20   Fulp, Cammie, MD  amphetamine-dextroamphetamine (ADDERALL XR) 30 MG 24 hr capsule Take 30 mg by mouth daily.  03/10/18   [provider]  amphetamine-dextroamphetamine (ADDERALL) 20 MG tablet Take 10 mg by mouth every evening. Pt. Is taking 34m in the evening. 10/13/17   [provider]  Blood Glucose Monitoring Suppl (ACCU-CHEK GUIDE) w/Device KIT 1 kit by Does not apply route 3 (three) times daily. To check blood sugars 07/05/19   Fulp, Cammie, MD  cariprazine (VRAYLAR) capsule 3 mg daily. 4.5 mg daily    [provider]  CHANTIX STARTING MONTH PAK 0.5 MG X 11 & 1 MG X 42 tablet  04/03/19   [provider]  ciprofloxacin (CIPRO) 500 MG tablet Take 1 tablet (500 mg total) by mouth 2 (two) times daily. 12/07/19   JLadell Pier MD  Cyanocobalamin (VITAMIN B 12 PO) Take 1 tablet by mouth daily.     [provider]  cyclobenzaprine (FLEXERIL) 10 MG tablet Take 1/2 to 1 whole tablet by mouth every 8 hours as needed for muscle pain/spasms. 07/26/18   AKaty Apo NP  doxepin (SINEQUAN) 75 MG capsule Take 75 mg by mouth at bedtime. 03/02/18   [provider]  doxycycline (VIBRAMYCIN) 100 MG capsule Take 1 capsule (100 mg total) by mouth 2 (two) times daily for 7 days. 02/19/20 02/26/20  Awilda Covin, JMarguerita Beards PA-C  EPINEPHrine 0.3 mg/0.3 mL IJ SOAJ injection Inject 0.3 mg as directed as needed (severe allergic reaction).  01/11/14   [provider]  Evolocumab (REPATHA SURECLICK) 1497MG/ML SOAJ Inject 1 pen into the skin every 14 (fourteen) days. 09/17/19   SBelva Crome MD  ezetimibe (ZETIA) 10 MG tablet Take 1 tablet (10 mg total) by mouth daily. 01/01/20   SBelva Crome MD  glucose blood (ACCU-CHEK GUIDE) test strip Use as instructed 07/05/19   Fulp, Cammie, MD  levothyroxine (SYNTHROID) 112 MCG tablet TAKE 1 TABLET  EVERY DAY 12/20/19   Fulp, Cammie, MD  lidocaine-prilocaine (EMLA) cream Apply 1 application topically as needed. Patient not taking: Reported on 12/06/2019 01/29/19   HVanessa Kick MD  linaclotide (Rolan Lipa 290 MCG CAPS capsule Take 290 mcg by mouth at bedtime.    [provider]  metoCLOPramide (REGLAN) 10 MG tablet Take 1 tablet (10 mg total) by mouth every 8 (eight) hours as needed for nausea. Patient not taking: Reported on 12/06/2019 06/30/18   Mesner, JCorene Cornea MD  metoprolol tartrate (LOPRESSOR) 100 MG tablet Take one tablet by mouth 2 hours prior to CT 08/27/19   Belva Crome, MD  morphine (MS CONTIN) 15 MG 12 hr tablet Take 1 tablet (15 mg total) by mouth every 12 (twelve) hours. 11/14/14   Bayard Hugger, NP  Multiple Vitamin (MULTIVITAMIN) tablet Take 1 tablet by mouth every morning.     [provider]  nabumetone (RELAFEN) 750 MG tablet Take 750 mg by mouth 2 (two) times daily. 08/08/19   [provider]  nitroGLYCERIN (NITROSTAT) 0.4 MG SL tablet DISSOLVE 1 TABLET UNDER THE TONGUE EVERY 5 MINUTES AS NEEDED 10/08/19   Belva Crome, MD  olmesartan-hydrochlorothiazide (BENICAR HCT) 20-12.5 MG tablet TAKE 1 TABLET BY MOUTH DAILY 12/29/19   Fulp, Cammie, MD  omeprazole (PRILOSEC) 40 MG capsule TAKE 1 CAPSULE(40 MG) BY MOUTH DAILY 01/22/20   Fulp, Cammie, MD  Oxycodone HCl 10 MG TABS Take 1 tablet (10 mg total) by mouth every 6 (six) hours as needed (for pain). 11/14/14   Bayard Hugger, NP  predniSONE (STERAPRED UNI-PAK 21 TAB) 10 MG (21) TBPK tablet Take by mouth daily. Per box instruction 02/06/20   Augusto Gamble B, NP  pregabalin (LYRICA) 100 MG capsule Take 1 capsule (100 mg total) by mouth 3 (three) times daily. 08/08/19   Argentina Donovan, PA-C  tiZANidine (ZANAFLEX) 2 MG tablet Take 2 mg by mouth every 8 (eight) hours as needed. 03/12/19   [provider]  umeclidinium-vilanterol (ANORO ELLIPTA) 62.5-25 MCG/INH AEPB Inhale 1 puff into the lungs daily.  04/17/19   Baird Lyons D, MD  VOLTAREN 1 % GEL Apply 4 g topically 4 (four) times daily. 12/25/17   Tereasa Coop, PA-C  vortioxetine HBr (TRINTELLIX) 10 MG TABS tablet 10 mg daily.    [provider]  escitalopram (LEXAPRO) 20 MG tablet Take 20 mg by mouth daily.  12/14/18  [provider]  LATUDA 120 MG TABS Take 120 mg by mouth at bedtime.  03/04/18 12/14/18  [provider]  LISINOPRIL PO Take by mouth daily.    09/06/11  [provider]    Family History Family History  Problem Relation Age of Onset  . Cancer Father        colon  . Stroke Father   . Heart disease Father   . Diabetes Father   . Hypertension Father   . Depression Sister   . Hypertension Mother   . Breast cancer Neg Hx     Social History Social History   Tobacco Use  . Smoking status: Former Smoker    Packs/day: 0.25    Years: 43.00    Pack years: 10.75    Types: Cigarettes    Quit date: 2020    Years since quitting: 1.6  . Smokeless tobacco: Never Used  Vaping Use  . Vaping Use: Never used  Substance Use Topics  . Alcohol use: Yes    Comment: social  . Drug use: No     Allergies   Aspirin, Bee venom, Sulfa antibiotics, and Other   Review of Systems Review of Systems   Physical Exam Triage Vital Signs ED Triage Vitals  Enc Vitals Group     BP      Pulse      Resp      Temp      Temp src      SpO2      Weight      Height  Head Circumference      Peak Flow      Pain Score      Pain Loc      Pain Edu?      Excl. in [place]?    No data found.  Updated Vital Signs BP (!) 162/82 (BP Location: Right Arm)   Pulse 68   Temp 98.6 F (37 C) (Oral)   Resp 16   SpO2 97%   Visual Acuity Right Eye Distance:   Left Eye Distance:   Bilateral Distance:    Right Eye Near:   Left Eye Near:    Bilateral Near:     Physical Exam Vitals and nursing note reviewed. Exam conducted with a chaperone present.  Constitutional:      General: She is not in  acute distress.    Appearance: Normal appearance. She is well-developed.  HENT:     Head: Normocephalic and atraumatic.  Eyes:     Conjunctiva/sclera: Conjunctivae normal.  Cardiovascular:     Rate and Rhythm: Normal rate.  Pulmonary:     Effort: Pulmonary effort is normal. No respiratory distress.  Chest:     Breasts:        Right: No nipple discharge.        Left: No nipple discharge.       Comments: Approximately 2 cm area of swelling as marked on diagram.  Tender to palpation.  Mildly warm.  No open drainage.  No other breast masses or lumps appreciated.  No axillary lymphadenopathy. Musculoskeletal:     Cervical back: Neck supple.  Skin:    General: Skin is warm and dry.  Neurological:     Mental Status: She is alert.      UC Treatments / Results  Labs (all labs ordered are listed, but only abnormal results are displayed) Labs Reviewed - No data to display  EKG   Radiology No results found.  Procedures Procedures (including critical care time)  Medications Ordered in UC Medications - No data to display  Initial Impression / Assessment and Plan / UC Course  I have reviewed the triage vital signs and the nursing notes.  Pertinent labs & imaging results that were available during my care of the patient were reviewed by me and considered in my medical decision making (see chart for details).     #Breast mass #Nipple discharge Patient 59 year old with history of breast masses and nipple discharge presenting with breast mass and recent nipple discharge.  Referral sent to breast imaging center for mammogram, ultrasound and possible aspiration.  Will place on doxycycline to cover for infection.  Encourage patient to contact breast center tomorrow at her primary care as well.  Patient verbalized agreement understanding plan of care Final Clinical Impressions(s) / UC Diagnoses   Final diagnoses:  Breast mass  Nipple discharge     Discharge Instructions     I  have faxed a referral for your breast imaging - call them tomorrow if you have not heard from them - call your primary care tomorrow morning and schedule follow up and discuss todays visit  Take the antibiotic with plenty of water 2 times a day      ED Prescriptions    Medication Sig Dispense Auth. Provider   doxycycline (VIBRAMYCIN) 100 MG capsule Take 1 capsule (100 mg total) by mouth 2 (two) times daily for 7 days. 14 capsule Dmonte Maher, Marguerita Beards, PA-C     PDMP not reviewed this encounter.   Darcia Lampi, Marguerita Beards,  PA-C 02/19/20 2248

## 2020-02-19 NOTE — ED Triage Notes (Signed)
Pt presents with complaints of having a lump in her right breast next to her nipple. Reports concern for pus being in the bump.

## 2020-02-19 NOTE — Discharge Instructions (Addendum)
I have faxed a referral for your breast imaging - call them tomorrow if you have not heard from them - call your primary care tomorrow morning and schedule follow up and discuss todays visit  Take the antibiotic with plenty of water 2 times a day

## 2020-02-20 ENCOUNTER — Ambulatory Visit
Admission: RE | Admit: 2020-02-20 | Discharge: 2020-02-20 | Disposition: A | Discharge status: Home / Self Care | Payer: Medicare HMO | Source: Ambulatory Visit | Attending: Physician Assistant | Admitting: Physician Assistant

## 2020-02-20 ENCOUNTER — Other Ambulatory Visit: Payer: Self-pay | Admitting: Physician Assistant

## 2020-02-20 DIAGNOSIS — N6310 Unspecified lump in the right breast, unspecified quadrant: Secondary | ICD-10-CM

## 2020-02-20 DIAGNOSIS — N6452 Nipple discharge: Secondary | ICD-10-CM

## 2020-02-24 ENCOUNTER — Encounter: Payer: Self-pay | Admitting: Nurse Practitioner

## 2020-02-27 ENCOUNTER — Other Ambulatory Visit: Payer: Medicare HMO

## 2020-03-10 ENCOUNTER — Ambulatory Visit
Admission: RE | Admit: 2020-03-10 | Discharge: 2020-03-10 | Disposition: A | Discharge status: Home / Self Care | Payer: Medicare HMO | Source: Ambulatory Visit | Attending: Physician Assistant | Admitting: Physician Assistant

## 2020-03-10 ENCOUNTER — Other Ambulatory Visit: Payer: Self-pay | Admitting: Family Medicine

## 2020-03-10 ENCOUNTER — Other Ambulatory Visit: Payer: Self-pay

## 2020-03-10 ENCOUNTER — Other Ambulatory Visit: Payer: Self-pay | Admitting: Physician Assistant

## 2020-03-10 DIAGNOSIS — N6310 Unspecified lump in the right breast, unspecified quadrant: Secondary | ICD-10-CM

## 2020-03-11 ENCOUNTER — Telehealth: Payer: Self-pay

## 2020-03-11 DIAGNOSIS — E785 Hyperlipidemia, unspecified: Secondary | ICD-10-CM

## 2020-03-11 NOTE — Telephone Encounter (Signed)
-----   Message from Ramond Dial, [place] sent at 03/11/2020  6:56 AM EDT -----  ----- Message ----- From: Ramond Dial, RPH-CPP Sent: 03/11/2020 To: Ramond Dial, RPH-CPP  Set up lipids

## 2020-03-11 NOTE — Telephone Encounter (Signed)
Called and lmomed the pt that they needed to get fasting lipids. Orders placed

## 2020-03-19 ENCOUNTER — Other Ambulatory Visit: Payer: Self-pay

## 2020-03-19 ENCOUNTER — Ambulatory Visit
Admission: RE | Admit: 2020-03-19 | Discharge: 2020-03-19 | Disposition: A | Discharge status: Home / Self Care | Payer: Medicare HMO | Source: Ambulatory Visit | Attending: Family Medicine | Admitting: Family Medicine

## 2020-03-19 DIAGNOSIS — N6310 Unspecified lump in the right breast, unspecified quadrant: Secondary | ICD-10-CM

## 2020-03-28 ENCOUNTER — Other Ambulatory Visit: Payer: Self-pay | Admitting: Family Medicine

## 2020-03-28 DIAGNOSIS — E039 Hypothyroidism, unspecified: Secondary | ICD-10-CM

## 2020-04-02 ENCOUNTER — Ambulatory Visit: Payer: Self-pay | Admitting: *Deleted

## 2020-04-02 ENCOUNTER — Ambulatory Visit: Payer: Self-pay | Admitting: Family Medicine

## 2020-04-02 NOTE — Telephone Encounter (Signed)
Pt reports fatigue and "No energy.Just feel sick." Also reports no appetite and intermittent headache. Onset of symptoms 5 days ago. No new medications. States BS has been running 70-90, usual 130's. States BP "Normal." Denies fever, no N/V/D. Reports had cyst removed from right breast and had been on antibiotics for 3 weeks, completed course "Last week.  Reports headache 7-8/10 when occurs, tylenol helps "for a while." Pt has been fully vaccinated. After hours call, unable to schedule due to covid screening, headache. Assured pt NT would route to practice for PCPs review. Care advise given per protocol, pt verbalizes understanding.   Please advise: CB# [phone] Reason for Disposition . [1] MODERATE weakness (i.e., interferes with work, school, normal activities) AND [2] persists > 3 days  Answer Assessment - Initial Assessment Questions 1. DESCRIPTION: "Describe how you are feeling."     No energy 2. SEVERITY: "How bad is it?"  "Can you stand and walk?"   - MILD - Feels weak or tired, but does not interfere with work, school or normal activities   - [place] to stand and walk; weakness interferes with work, school, or normal activities   - SEVERE - Unable to stand or walk     moderate 3. ONSET:  "When did the weakness begin?"     5 days ago 4. CAUSE: "What do you think is causing the weakness?"     Unsure 5. MEDICINES: "Have you recently started a new medicine or had a change in the amount of a medicine?"     No 6. OTHER SYMPTOMS: "Do you have any other symptoms?" (e.g., chest pain, fever, cough, SOB, vomiting, diarrhea, bleeding, other areas of pain)     Headache,7-8/10 intermittent. Tylenol helps for a while.  Protocols used: WEAKNESS (GENERALIZED) AND FATIGUE-A-AH

## 2020-04-02 NOTE — Telephone Encounter (Signed)
Called pt no ansewer/ left VM to call back

## 2020-04-02 NOTE — Telephone Encounter (Signed)
Attempted to contact patient to evaluate symptoms. Left VM to return call to office.

## 2020-04-03 ENCOUNTER — Other Ambulatory Visit: Payer: Self-pay | Admitting: Family Medicine

## 2020-04-03 ENCOUNTER — Telehealth: Payer: Self-pay

## 2020-04-03 DIAGNOSIS — I10 Essential (primary) hypertension: Secondary | ICD-10-CM

## 2020-04-03 NOTE — Telephone Encounter (Signed)
Att to contact pt to schedule appt virtually, no ans unable to lvm for pt at phone numbers on file

## 2020-04-09 ENCOUNTER — Telehealth: Payer: Self-pay

## 2020-04-09 NOTE — Telephone Encounter (Signed)
-----   Message from Ramond Dial, [place] sent at 04/09/2020 12:29 PM EDT ----- Please try to call again ----- Message ----- From: Ramond Dial, RPH-CPP Sent: 03/11/2020 To: Ramond Dial, RPH-CPP  Set up lipids

## 2020-04-09 NOTE — Telephone Encounter (Signed)
lmom for lipids

## 2020-04-10 ENCOUNTER — Other Ambulatory Visit: Payer: Self-pay

## 2020-04-10 ENCOUNTER — Other Ambulatory Visit: Payer: Medicare HMO | Admitting: *Deleted

## 2020-04-10 DIAGNOSIS — E785 Hyperlipidemia, unspecified: Secondary | ICD-10-CM

## 2020-04-10 LAB — LIPID PANEL
Chol/HDL Ratio: 2.1 ratio (ref 0.0–4.4)
Cholesterol, Total: 189 mg/dL (ref 100–199)
HDL: 89 mg/dL (ref >39)
LDL Chol Calc (NIH): 87 mg/dL (ref 0–99)
Triglycerides: 74 mg/dL (ref 0–149)
VLDL Cholesterol Cal: 13 mg/dL (ref 5–40)

## 2020-04-10 LAB — LDL CHOLESTEROL, DIRECT: LDL Direct: 84 mg/dL (ref 0–99)

## 2020-04-11 ENCOUNTER — Other Ambulatory Visit: Payer: Self-pay | Admitting: Family Medicine

## 2020-04-11 ENCOUNTER — Telehealth: Payer: Self-pay

## 2020-04-11 DIAGNOSIS — I10 Essential (primary) hypertension: Secondary | ICD-10-CM

## 2020-04-11 DIAGNOSIS — E785 Hyperlipidemia, unspecified: Secondary | ICD-10-CM

## 2020-04-11 NOTE — Telephone Encounter (Signed)
Both Praluent 150mg  and Nexlizet prior authorizations have been approved through 06/13/21. Will await pt's return call to discuss changes in therapy.

## 2020-04-11 NOTE — Telephone Encounter (Addendum)
Left voicemail on patient's answering machine & requested a call back. The patient's LDL only dropped 10 points after starting Repatha with minimal additional lowering after starting Ezetimibe.   The patient would benefit from changing Repatha to Praluent 150mg  every two weeks.I would also like to discuss changing the patient's Ezetimibe to Nexlizet to provide additional LDL lowering.   I have filed a PA for both Praluent 150mg  & Nexlizet

## 2020-04-16 ENCOUNTER — Other Ambulatory Visit: Payer: Self-pay | Admitting: Family Medicine

## 2020-04-16 DIAGNOSIS — E039 Hypothyroidism, unspecified: Secondary | ICD-10-CM

## 2020-04-16 MED ORDER — PRALUENT 150 MG/ML ~~LOC~~ SOAJ: 1.0000 "pen " | SUBCUTANEOUS | 3 refills | Status: DC

## 2020-04-16 MED ORDER — NEXLIZET 180-10 MG PO TABS: 1.0000 | ORAL_TABLET | Freq: Every day | ORAL | 3 refills | Status: DC

## 2020-04-16 NOTE — Telephone Encounter (Signed)
Requested Prescriptions  Pending Prescriptions Disp Refills   levothyroxine (SYNTHROID) 112 MCG tablet [Pharmacy Med Name: LEVOTHYROXINE SODIUM 112 MCG Tablet] 90 tablet 0    Sig: TAKE 1 TABLET EVERY DAY (MUST HAVE OFFICE VISIT FOR FUTURE REFILLS)     Endocrinology:  Hypothyroid Agents Failed - 04/16/2020  9:58 AM      Failed - TSH needs to be rechecked within 3 months after an abnormal result. Refill until TSH is due.      Failed - TSH in normal range and within 360 days    TSH  Date Value Ref Range Status  01/11/2020 18.100 (H) 0.450 - 4.500 uIU/mL Final         Passed - Valid encounter within last 12 months    Recent Outpatient Visits          4 months ago Urinary incontinence, unspecified type   [place], [place] J, NP   8 months ago Neuropathic pain   [place], [place] Dy M, [place]   9 months ago Type 2 diabetes mellitus with chronic painful diabetic neuropathy [facility])   [facility] Antony Blackbird, MD

## 2020-04-16 NOTE — Telephone Encounter (Signed)
Pt returned call to clinic. She is aware to stop Repatha and Zetia and will instead start Praluent and Nexlizet. Scheduled follow up labs in 3 months to assess efficacy.

## 2020-04-16 NOTE — Telephone Encounter (Signed)
Left a vm for patient to callback and schedule a appointment

## 2020-04-26 ENCOUNTER — Other Ambulatory Visit: Payer: Self-pay | Admitting: Family Medicine

## 2020-04-26 DIAGNOSIS — K219 Gastro-esophageal reflux disease without esophagitis: Secondary | ICD-10-CM

## 2020-05-07 DIAGNOSIS — I10 Essential (primary) hypertension: Secondary | ICD-10-CM | POA: Insufficient documentation

## 2020-05-07 DIAGNOSIS — M5442 Lumbago with sciatica, left side: Secondary | ICD-10-CM | POA: Diagnosis not present

## 2020-05-07 DIAGNOSIS — G8929 Other chronic pain: Secondary | ICD-10-CM | POA: Diagnosis not present

## 2020-05-07 DIAGNOSIS — M47816 Spondylosis without myelopathy or radiculopathy, lumbar region: Secondary | ICD-10-CM | POA: Diagnosis not present

## 2020-05-07 DIAGNOSIS — M25561 Pain in right knee: Secondary | ICD-10-CM | POA: Diagnosis not present

## 2020-05-12 ENCOUNTER — Other Ambulatory Visit: Payer: Self-pay

## 2020-05-12 ENCOUNTER — Ambulatory Visit (HOSPITAL_COMMUNITY)
Admission: EM | Admit: 2020-05-12 | Discharge: 2020-05-12 | Disposition: A | Discharge status: Home / Self Care | Payer: Medicare HMO | Attending: Family Medicine | Admitting: Family Medicine

## 2020-05-12 ENCOUNTER — Encounter (HOSPITAL_COMMUNITY): Payer: Self-pay

## 2020-05-12 DIAGNOSIS — N390 Urinary tract infection, site not specified: Secondary | ICD-10-CM | POA: Diagnosis not present

## 2020-05-12 DIAGNOSIS — R300 Dysuria: Secondary | ICD-10-CM | POA: Insufficient documentation

## 2020-05-12 LAB — POCT URINALYSIS DIPSTICK, ED / UC
Bilirubin Urine: NEGATIVE
Glucose, UA: NEGATIVE mg/dL
Leukocytes,Ua: NEGATIVE
Nitrite: POSITIVE — AB
Protein, ur: NEGATIVE mg/dL
Specific Gravity, Urine: 1.015 (ref 1.005–1.030)
Urobilinogen, UA: 0.2 mg/dL (ref 0.0–1.0)
pH: 6 (ref 5.0–8.0)

## 2020-05-12 MED ORDER — CEPHALEXIN 500 MG PO CAPS: 500.0000 mg | ORAL_CAPSULE | Freq: Two times a day (BID) | ORAL | 0 refills | Status: AC

## 2020-05-12 NOTE — ED Triage Notes (Signed)
Pt in with c/o uti sxs. States that she has been going more frequently and urine has a strong odor that has been going on for 2 days now.  Also c/o lower abdominal pain when urinating

## 2020-05-12 NOTE — ED Provider Notes (Signed)
[facility]    CSN: [id number] Arrival date & time: 05/12/20  0822      History   Chief Complaint Chief Complaint  Patient presents with  . Urinary Tract Infection    HPI Caroline Williams is a 59 y.o. female.   Patient is a 59-year-old female who presents today with possible urinary tract infection.  States she is urinary frequency, dysuria and urinary odor.  This is for 2 days.  Some mild lower abdominal cramping with urination.  No fevers, flank pain, nausea or vomiting     Past Medical History:  Diagnosis Date  . Anxiety   . Arthritis   . Asthma   . Bipolar 1 disorder (HCC)   . Breast discharge   . Breast lump   . Breast pain   . Chronic pain    on MS Contin and oxycodone  . Depression   . Diabetes mellitus    diet and exercise controlled  . Escherichia coli (E. coli) infection   . Fever   . GERD (gastroesophageal reflux disease)   . History of chest pain   . History of kidney stones   . History of knee replacement, total   . Hypertension   . Hypothyroidism   . N&V (nausea and vomiting)   . Peripheral neuropathy    Right foot no sensation  . Pneumonia    2015,2014  . Sleep apnea    does not use CPAP  . Thyroid disease   . Wears glasses     Patient Active Problem List   Diagnosis Date Noted  . Hyperlipidemia 09/17/2019  . COPD mixed type (HCC) 04/17/2019  . Tobacco user 04/17/2019  . Nocturnal hypoxemia 12/25/2018  . Change in bowel habits 03/14/2018  . Chest pain 01/26/2016  . Hallucination, drug-induced (HCC) 03/12/2015  . Hypothyroidism 03/12/2015  . Lap gastric bypass June 2016 11/25/2014  . Diabetes mellitus with neuropathy (HCC) 06/20/2013  . Chronic pain of right knee 06/20/2013  . Morbid obesity (HCC) 07/05/2012    Past Surgical History:  Procedure Laterality Date  . ABDOMINAL HYSTERECTOMY     partial  . APPENDECTOMY    . BIOPSY  03/14/2018   Procedure: BIOPSY;  Surgeon: Schooler, Vincent, MD;  Location: [facility];   Service: Endoscopy;;  . BREAST DUCTAL SYSTEM EXCISION Right 08/31/2017   Procedure: RIGHT BREAST CENTRAL DUCT EXCISION;  Surgeon: Cornett, Thomas, MD;  Location: [facility];  Service: General;  Laterality: Right;  . BREAST EXCISIONAL BIOPSY Right   . BREAST LUMPECTOMY     right  . CARDIAC CATHETERIZATION     no significant CAD, nl LV function by 11/08/06 cath  . CESAREAN SECTION     x4  . COLONOSCOPY WITH PROPOFOL N/A 03/14/2018   Procedure: COLONOSCOPY WITH PROPOFOL;  Surgeon: Schooler, Vincent, MD;  Location: [facility];  Service: Endoscopy;  Laterality: N/A;  . GASTRIC ROUX-EN-Y N/A 11/25/2014   Procedure: LAPAROSCOPIC ROUX-EN-Y GASTRIC BYPASS WITH UPPER ENDOSCOPY;  Surgeon: Matthew Martin, MD;  Location: [facility];  Service: General;  Laterality: N/A;  . HERNIA REPAIR    . JOINT REPLACEMENT    . KNEE SURGERY     right  . MOUTH SURGERY    . MYOMECTOMY ABDOMINAL APPROACH  1989  . POLYPECTOMY  03/14/2018   Procedure: POLYPECTOMY;  Surgeon: Schooler, Vincent, MD;  Location: [facility];  Service: Endoscopy;;  . TOTAL KNEE REVISION  06/21/2012   Procedure: TOTAL KNEE REVISION;  Surgeon: Marcus V Duda, MD;    Location: [facility];  Service: Orthopedics;  Laterality: Right;  Revision Right Total Knee Arthroplasty    OB History   No obstetric history on file.      Home Medications    Prior to Admission medications   Medication Sig Start Date End Date Taking? Authorizing Provider  albuterol (PROVENTIL HFA;VENTOLIN HFA) 108 (90 BASE) MCG/ACT inhaler Inhale 2 puffs into the lungs every 6 (six) hours as needed for wheezing or shortness of breath. Shortness of breath    [provider]  Alirocumab (PRALUENT) 150 MG/ML SOAJ Inject 1 pen into the skin every 14 (fourteen) days. 04/16/20   Smith, Henry W, MD  ALPRAZolam (XANAX) 1 MG tablet Take 1 mg by mouth 4 (four) times daily.  07/02/14   [provider]  amLODipine (NORVASC) 5 MG tablet TAKE 1 TABLET(5 MG) BY MOUTH  DAILY 04/03/20   F[facility], Cammie, MD  amphetamine-dextroamphetamine (ADDERALL XR) 30 MG 24 hr capsule Take 30 mg by mouth daily.  03/10/18   [provider]  amphetamine-dextroamphetamine (ADDERALL) 20 MG tablet Take 10 mg by mouth every evening. Pt. Is taking 10mg in the evening. 10/13/17   [provider]  Bempedoic Acid-Ezetimibe (NEXLIZET) 180-10 MG TABS Take 1 tablet by mouth daily. 04/16/20   Smith, Henry W, MD  Blood Glucose Monitoring Suppl (ACCU-CHEK GUIDE) w/Device KIT 1 kit by Does not apply route 3 (three) times daily. To check blood sugars 07/05/19   F[facility], Cammie, MD  cariprazine (VRAYLAR) capsule 3 mg daily. 4.5 mg daily    [provider]  cephALEXin (KEFLEX) 500 MG capsule Take 1 capsule (500 mg total) by mouth 2 (two) times daily for 5 days. 05/12/20 05/17/20  ,  A, NP  CHANTIX STARTING MONTH PAK 0.5 MG X 11 & 1 MG X 42 tablet  04/03/19   [provider]  ciprofloxacin (CIPRO) 500 MG tablet Take 1 tablet (500 mg total) by mouth 2 (two) times daily. 12/07/19   Johnson, Deborah B, MD  Cyanocobalamin (VITAMIN B 12 PO) Take 1 tablet by mouth daily.     [provider]  doxepin (SINEQUAN) 75 MG capsule Take 75 mg by mouth at bedtime. 03/02/18   [provider]  EPINEPHrine 0.3 mg/0.3 mL IJ SOAJ injection Inject 0.3 mg as directed as needed (severe allergic reaction).  01/11/14   [provider]  glucose blood (ACCU-CHEK GUIDE) test strip Use as instructed 07/05/19   Fulp, Cammie, MD  levothyroxine (SYNTHROID) 112 MCG tablet TAKE 1 TABLET EVERY DAY (MUST HAVE OFFICE VISIT FOR FUTURE REFILLS) 04/16/20   Fulp, Cammie, MD  linaclotide (LINZESS) 290 MCG CAPS capsule Take 290 mcg by mouth at bedtime.    [provider]  metoprolol tartrate (LOPRESSOR) 100 MG tablet Take one tablet by mouth 2 hours prior to CT 08/27/19   Smith, Henry W, MD  morphine (MS CONTIN) 15 MG 12 hr tablet Take 1 tablet (15 mg total) by mouth every 12  (twelve) hours. 11/14/14   Thomas, Eunice L, NP  Multiple Vitamin (MULTIVITAMIN) tablet Take 1 tablet by mouth every morning.     [provider]  nabumetone (RELAFEN) 750 MG tablet Take 750 mg by mouth 2 (two) times daily. 08/08/19   [provider]  nitroGLYCERIN (NITROSTAT) 0.4 MG SL tablet DISSOLVE 1 TABLET UNDER THE TONGUE EVERY 5 MINUTES AS NEEDED 10/08/19   Smith, Henry W, MD  olmesartan-hydrochlorothiazide (BENICAR HCT) 20-12.5 MG tablet TAKE 1 TABLET BY MOUTH DAILY 12/29/19     Fulp, Cammie, MD  omeprazole (PRILOSEC) 40 MG capsule TAKE 1 CAPSULE(40 MG) BY MOUTH DAILY 01/22/20   F[facility], Cammie, MD  Oxycodone HCl 10 MG TABS Take 1 tablet (10 mg total) by mouth every 6 (six) hours as needed (for pain). 11/14/14   Thomas, Eunice L, NP  pregabalin (LYRICA) 100 MG capsule Take 1 capsule (100 mg total) by mouth 3 (three) times daily. 08/08/19   McClung, Angela M, PA-C  umeclidinium-vilanterol (ANORO ELLIPTA) 62.5-25 MCG/INH AEPB Inhale 1 puff into the lungs daily. 04/17/19   Young, Clinton D, MD  VOLTAREN 1 % GEL Apply 4 g topically 4 (four) times daily. 12/25/17   Clark, Michael L, PA-C  vortioxetine HBr (TRINTELLIX) 10 MG TABS tablet 10 mg daily.    [provider]  escitalopram (LEXAPRO) 20 MG tablet Take 20 mg by mouth daily.  12/14/18  [provider]  LATUDA 120 MG TABS Take 120 mg by mouth at bedtime.  03/04/18 12/14/18  [provider]  LISINOPRIL PO Take by mouth daily.    09/06/11  [provider]  metoCLOPramide (REGLAN) 10 MG tablet Take 1 tablet (10 mg total) by mouth every 8 (eight) hours as needed for nausea. Patient not taking: Reported on 12/06/2019 06/30/18 05/12/20  Mesner, Jason, MD    Family History Family History  Problem Relation Age of Onset  . Cancer Father        colon  . Stroke Father   . Heart disease Father   . Diabetes Father   . Hypertension Father   . Depression Sister   . Hypertension Mother   . Breast cancer Neg Hx      Social History Social History   Tobacco Use  . Smoking status: Former Smoker    Packs/day: 0.25    Years: 43.00    Pack years: 10.75    Types: Cigarettes    Quit date: 2020    Years since quitting: 1.9  . Smokeless tobacco: Never Used  Vaping Use  . Vaping Use: Never used  Substance Use Topics  . Alcohol use: Yes    Comment: social  . Drug use: No     Allergies   Aspirin, Bee venom, Sulfa antibiotics, and Other   Review of Systems Review of Systems   Physical Exam Triage Vital Signs ED Triage Vitals  Enc Vitals Group     BP 05/12/20 0844 121/81     Pulse Rate 05/12/20 0844 68     Resp 05/12/20 0844 18     Temp 05/12/20 0844 98 F (36.7 C)     Temp Source 05/12/20 0844 Oral     SpO2 05/12/20 0844 95 %     Weight --      Height --      Head Circumference --      Peak Flow --      Pain Score 05/12/20 0843 0     Pain Loc --      Pain Edu? --      Excl. in GC? --    No data found.  Updated Vital Signs BP 121/81 (BP Location: Right Arm)   Pulse 68   Temp 98 F (36.7 C) (Oral)   Resp 18   SpO2 95%   Visual Acuity Right Eye Distance:   Left Eye Distance:   Bilateral Distance:    Right Eye Near:   Left Eye Near:    Bilateral Near:     Physical Exam Vitals and nursing note   reviewed.  Constitutional:      General: She is not in acute distress.    Appearance: Normal appearance. She is not ill-appearing, toxic-appearing or diaphoretic.  HENT:     Head: Normocephalic.     Nose: Nose normal.  Eyes:     Conjunctiva/sclera: Conjunctivae normal.  Pulmonary:     Effort: Pulmonary effort is normal.  Musculoskeletal:        General: Normal range of motion.     Cervical back: Normal range of motion.  Skin:    General: Skin is warm and dry.     Findings: No rash.  Neurological:     Mental Status: She is alert.  Psychiatric:        Mood and Affect: Mood normal.      UC Treatments / Results  Labs (all labs ordered are listed, but only  abnormal results are displayed) Labs Reviewed  POCT URINALYSIS DIPSTICK, ED / UC - Abnormal; Notable for the following components:      Result Value   Ketones, ur TRACE (*)    Hgb urine dipstick TRACE (*)    Nitrite POSITIVE (*)    All other components within normal limits  URINE CULTURE    EKG   Radiology No results found.  Procedures Procedures (including critical care time)  Medications Ordered in UC Medications - No data to display  Initial Impression / Assessment and Plan / UC Course  I have reviewed the triage vital signs and the nursing notes.  Pertinent labs & imaging results that were available during my care of the patient were reviewed by me and considered in my medical decision making (see chart for details).     Dysuria Urine with positive nitrites and hemoglobin. Will send for culture.  Treating with Keflex pending culture.  Recommended drink plenty water. Follow up as needed for continued or worsening symptoms  Final Clinical Impressions(s) / UC Diagnoses   Final diagnoses:  Dysuria     Discharge Instructions     Treating you for a urinary tract infection Take the medicine as prescribed Drink plenty of water.  Follow up as needed for continued or worsening symptoms     ED Prescriptions    Medication Sig Dispense Auth. Provider   cephALEXin (KEFLEX) 500 MG capsule Take 1 capsule (500 mg total) by mouth 2 (two) times daily for 5 days. 10 capsule Loura Halt A, NP     PDMP not reviewed this encounter.   Orvan July, NP 05/12/20 [phone]

## 2020-05-12 NOTE — Discharge Instructions (Addendum)
Treating you for a urinary tract infection Take the medicine as prescribed Drink plenty of water.  Follow up as needed for continued or worsening symptoms

## 2020-05-14 LAB — URINE CULTURE: Culture: 50000 — AB

## 2020-05-27 DIAGNOSIS — F900 Attention-deficit hyperactivity disorder, predominantly inattentive type: Secondary | ICD-10-CM | POA: Diagnosis not present

## 2020-05-27 DIAGNOSIS — F423 Hoarding disorder: Secondary | ICD-10-CM | POA: Diagnosis not present

## 2020-05-27 DIAGNOSIS — F4001 Agoraphobia with panic disorder: Secondary | ICD-10-CM | POA: Diagnosis not present

## 2020-05-27 DIAGNOSIS — F250 Schizoaffective disorder, bipolar type: Secondary | ICD-10-CM | POA: Diagnosis not present

## 2020-06-03 DIAGNOSIS — M5136 Other intervertebral disc degeneration, lumbar region: Secondary | ICD-10-CM | POA: Diagnosis not present

## 2020-06-03 DIAGNOSIS — M5416 Radiculopathy, lumbar region: Secondary | ICD-10-CM | POA: Diagnosis not present

## 2020-07-09 DIAGNOSIS — M1711 Unilateral primary osteoarthritis, right knee: Secondary | ICD-10-CM | POA: Diagnosis not present

## 2020-07-09 DIAGNOSIS — Z6839 Body mass index (BMI) 39.0-39.9, adult: Secondary | ICD-10-CM | POA: Diagnosis not present

## 2020-07-09 DIAGNOSIS — I10 Essential (primary) hypertension: Secondary | ICD-10-CM | POA: Diagnosis not present

## 2020-07-11 DIAGNOSIS — Z96651 Presence of right artificial knee joint: Secondary | ICD-10-CM | POA: Diagnosis not present

## 2020-07-11 DIAGNOSIS — M1712 Unilateral primary osteoarthritis, left knee: Secondary | ICD-10-CM | POA: Diagnosis not present

## 2020-07-11 DIAGNOSIS — M19031 Primary osteoarthritis, right wrist: Secondary | ICD-10-CM | POA: Diagnosis not present

## 2020-07-14 ENCOUNTER — Other Ambulatory Visit: Payer: Self-pay

## 2020-07-14 ENCOUNTER — Other Ambulatory Visit: Payer: Medicare HMO | Admitting: *Deleted

## 2020-07-14 DIAGNOSIS — E785 Hyperlipidemia, unspecified: Secondary | ICD-10-CM

## 2020-07-15 ENCOUNTER — Telehealth: Payer: Self-pay | Admitting: Pharmacist

## 2020-07-15 ENCOUNTER — Telehealth: Payer: Self-pay | Admitting: Interventional Cardiology

## 2020-07-15 LAB — COMPREHENSIVE METABOLIC PANEL
ALT: 18 IU/L (ref 0–32)
AST: 20 IU/L (ref 0–40)
Albumin/Globulin Ratio: 1.9 (ref 1.2–2.2)
Albumin: 4.5 g/dL (ref 3.8–4.9)
Alkaline Phosphatase: 73 IU/L (ref 44–121)
BUN/Creatinine Ratio: 12 (ref 12–28)
BUN: 14 mg/dL (ref 8–27)
Bilirubin Total: 0.3 mg/dL (ref 0.0–1.2)
CO2: 29 mmol/L (ref 20–29)
Calcium: 9.4 mg/dL (ref 8.7–10.3)
Chloride: 102 mmol/L (ref 96–106)
Creatinine, Ser: 1.15 mg/dL — ABNORMAL HIGH (ref 0.57–1.00)
GFR calc Af Amer: 60 mL/min/{1.73_m2} (ref >59)
GFR calc non Af Amer: 52 mL/min/{1.73_m2} — ABNORMAL LOW (ref >59)
Globulin, Total: 2.4 g/dL (ref 1.5–4.5)
Glucose: 87 mg/dL (ref 65–99)
Potassium: 3.9 mmol/L (ref 3.5–5.2)
Sodium: 144 mmol/L (ref 134–144)
Total Protein: 6.9 g/dL (ref 6.0–8.5)

## 2020-07-15 LAB — LIPID PANEL
Chol/HDL Ratio: 1.6 ratio (ref 0.0–4.4)
Cholesterol, Total: 110 mg/dL (ref 100–199)
HDL: 69 mg/dL (ref >39)
LDL Chol Calc (NIH): 28 mg/dL (ref 0–99)
Triglycerides: 54 mg/dL (ref 0–149)
VLDL Cholesterol Cal: 13 mg/dL (ref 5–40)

## 2020-07-15 NOTE — Telephone Encounter (Signed)
Lipid panel looks significantly better Continue Praluent 150mg  q 14 days and Nexlizet 180/10mg  daily Called pt to review. No answer. Left VM to call back

## 2020-07-15 NOTE — Telephone Encounter (Signed)
Triage please ignore. I was the one who called the patient

## 2020-07-15 NOTE — Telephone Encounter (Signed)
New message:     Patient returning call back from this morning.

## 2020-07-15 NOTE — Telephone Encounter (Signed)
Patient returned call. She was made aware of results and all questions answered. Patient was very happy with results and thanked me for the call.

## 2020-07-16 ENCOUNTER — Other Ambulatory Visit: Payer: Self-pay | Admitting: Pharmacist

## 2020-07-16 ENCOUNTER — Other Ambulatory Visit: Payer: Self-pay | Admitting: Family Medicine

## 2020-07-16 DIAGNOSIS — I10 Essential (primary) hypertension: Secondary | ICD-10-CM

## 2020-07-16 MED ORDER — AMLODIPINE BESYLATE 5 MG PO TABS: ORAL_TABLET | ORAL | 0 refills | Status: DC

## 2020-07-23 ENCOUNTER — Telehealth: Payer: Self-pay

## 2020-07-23 ENCOUNTER — Other Ambulatory Visit: Payer: Self-pay | Admitting: Nurse Practitioner

## 2020-07-23 DIAGNOSIS — E118 Type 2 diabetes mellitus with unspecified complications: Secondary | ICD-10-CM

## 2020-07-23 DIAGNOSIS — F4001 Agoraphobia with panic disorder: Secondary | ICD-10-CM | POA: Diagnosis not present

## 2020-07-23 DIAGNOSIS — F250 Schizoaffective disorder, bipolar type: Secondary | ICD-10-CM | POA: Diagnosis not present

## 2020-07-23 NOTE — Telephone Encounter (Signed)
Copied from [place] [id number]. Topic: General - Other >> Jul 23, 2020 11:27 AM Oneta Rack wrote: Reason for CRM  Patient scheduled to see PCP on 08/20/2020 Zelda Felming, patient unable to wait that long for referral and would like covering doctor to place referral for   Left big toe unable to wear a shoe, pain to touch please send referral to [facility]) [address] [place], [place] [address]  [phone]. Patient would like a follow up call when completed.   Former [facility] patient. Please follow up.

## 2020-07-24 DIAGNOSIS — M1711 Unilateral primary osteoarthritis, right knee: Secondary | ICD-10-CM | POA: Diagnosis not present

## 2020-07-25 DIAGNOSIS — M19031 Primary osteoarthritis, right wrist: Secondary | ICD-10-CM | POA: Diagnosis not present

## 2020-08-05 ENCOUNTER — Ambulatory Visit (HOSPITAL_COMMUNITY)
Admission: EM | Admit: 2020-08-05 | Discharge: 2020-08-05 | Disposition: A | Discharge status: Home / Self Care | Payer: Medicare HMO | Attending: Family Medicine | Admitting: Family Medicine

## 2020-08-05 ENCOUNTER — Other Ambulatory Visit: Payer: Self-pay

## 2020-08-05 ENCOUNTER — Encounter (HOSPITAL_COMMUNITY): Payer: Self-pay

## 2020-08-05 DIAGNOSIS — M79604 Pain in right leg: Secondary | ICD-10-CM

## 2020-08-05 MED ORDER — KETOROLAC TROMETHAMINE 30 MG/ML IJ SOLN
INTRAMUSCULAR | Status: AC
  Filled 2020-08-05: qty 1

## 2020-08-05 MED ORDER — PREDNISONE 20 MG PO TABS: 40.0000 mg | ORAL_TABLET | Freq: Every day | ORAL | 0 refills | Status: DC

## 2020-08-05 MED ORDER — KETOROLAC TROMETHAMINE 30 MG/ML IJ SOLN
30.0000 mg | Freq: Once | INTRAMUSCULAR | Status: AC
  Administered 2020-08-05: 30 mg via INTRAMUSCULAR

## 2020-08-05 NOTE — ED Triage Notes (Signed)
Pt presents with pain in right leg X 4 days. Pt states she has a titanium bar and states the pain travel the shin. She states today is the worst pain she has had.

## 2020-08-05 NOTE — ED Provider Notes (Signed)
[facility]    CSN: [id number] Arrival date & time: 08/05/20  9211      History   Chief Complaint Chief Complaint  Patient presents with  . Leg Pain    HPI Caroline Williams is a 60 y.o. female.   Patient presenting today with 4 days of acute on chronic right knee pain and swelling. States in 2011 she had a knee replacement, then 3 years later a revision due to ongoing issues from initial surgery. She has since had chronic pain issues with frequent flares of this exact nature where medial aspect of knee swells and is extremely painful and stiff. She typically has improvement with IM toradol which she is requesting today. Has seen numerous specialists in the past for this, has had steroid injections, nerve blocks and many other procedures all without ongoing improvement in sxs. Denies fever, chills, diffuse edema, posterior knee or calf pain, numbness or tingling down leg to foot.      Past Medical History:  Diagnosis Date  . Anxiety   . Arthritis   . Asthma   . Bipolar 1 disorder ([place])   . Breast discharge   . Breast lump   . Breast pain   . Chronic pain    on MS Contin and oxycodone  . Depression   . Diabetes mellitus    diet and exercise controlled  . Escherichia coli (E. coli) infection   . Fever   . GERD (gastroesophageal reflux disease)   . History of chest pain   . History of kidney stones   . History of knee replacement, total   . Hypertension   . Hypothyroidism   . N&V (nausea and vomiting)   . Peripheral neuropathy    Right foot no sensation  . Pneumonia    2015,2014  . Sleep apnea    does not use CPAP  . Thyroid disease   . Wears glasses     Patient Active Problem List   Diagnosis Date Noted  . Hyperlipidemia 09/17/2019  . COPD mixed type ([place]) 04/17/2019  . Tobacco user 04/17/2019  . Nocturnal hypoxemia 12/25/2018  . Change in bowel habits 03/14/2018  . Chest pain 01/26/2016  . Hallucination, drug-induced ([place]) 03/12/2015  .  Hypothyroidism 03/12/2015  . Lap gastric bypass June 2016 11/25/2014  . Diabetes mellitus with neuropathy ([place]) 06/20/2013  . Chronic pain of right knee 06/20/2013  . Morbid obesity ([place]) 07/05/2012    Past Surgical History:  Procedure Laterality Date  . ABDOMINAL HYSTERECTOMY     partial  . APPENDECTOMY    . BIOPSY  03/14/2018   Procedure: BIOPSY;  Surgeon: Wilford Corner, MD;  Location: [facility];  Service: Endoscopy;;  . BREAST DUCTAL SYSTEM EXCISION Right 08/31/2017   Procedure: RIGHT BREAST CENTRAL DUCT EXCISION;  Surgeon: Erroll Luna, MD;  Location: [facility];  Service: General;  Laterality: Right;  . BREAST EXCISIONAL BIOPSY Right   . BREAST LUMPECTOMY     right  . CARDIAC CATHETERIZATION     no significant CAD, nl LV function by 11/08/06 cath  . CESAREAN SECTION     x4  . COLONOSCOPY WITH PROPOFOL N/A 03/14/2018   Procedure: COLONOSCOPY WITH PROPOFOL;  Surgeon: Wilford Corner, MD;  Location: [facility];  Service: Endoscopy;  Laterality: N/A;  . GASTRIC ROUX-EN-Y N/A 11/25/2014   Procedure: LAPAROSCOPIC ROUX-EN-Y GASTRIC BYPASS WITH UPPER ENDOSCOPY;  Surgeon: Johnathan Hausen, MD;  Location: [facility];  Service: General;  Laterality: N/A;  . HERNIA  REPAIR    . JOINT REPLACEMENT    . KNEE SURGERY     right  . MOUTH SURGERY    . [place]  . POLYPECTOMY  03/14/2018   Procedure: POLYPECTOMY;  Surgeon: Wilford Corner, MD;  Location: [facility];  Service: Endoscopy;;  . TOTAL KNEE REVISION  06/21/2012   Procedure: TOTAL KNEE REVISION;  Surgeon: Newt Minion, MD;  Location: [facility];  Service: Orthopedics;  Laterality: Right;  Revision Right Total Knee Arthroplasty    OB History   No obstetric history on file.      Home Medications    Prior to Admission medications   Medication Sig Start Date End Date Taking? Authorizing Provider  predniSONE (DELTASONE) 20 MG tablet Take 2 tablets (40 mg total) by mouth daily with  breakfast. 08/05/20  Yes Volney American, PA-C  albuterol (PROVENTIL HFA;VENTOLIN HFA) 108 (90 BASE) MCG/ACT inhaler Inhale 2 puffs into the lungs every 6 (six) hours as needed for wheezing or shortness of breath. Shortness of breath    [provider]  Alirocumab (PRALUENT) 150 MG/ML SOAJ Inject 1 pen into the skin every 14 (fourteen) days. 04/16/20   Belva Crome, MD  ALPRAZolam Duanne Moron) 1 MG tablet Take 1 mg by mouth 4 (four) times daily.  07/02/14   [provider]  amLODipine (NORVASC) 5 MG tablet TAKE 1 TABLET(5 MG) BY MOUTH DAILY. Must keep upcoming office visit for refills 07/16/20   Charlott Rakes, MD  amphetamine-dextroamphetamine (ADDERALL XR) 30 MG 24 hr capsule Take 30 mg by mouth daily.  03/10/18   [provider]  amphetamine-dextroamphetamine (ADDERALL) 20 MG tablet Take 10 mg by mouth every evening. Pt. Is taking 37m in the evening. 10/13/17   [provider]  Bempedoic Acid-Ezetimibe (NEXLIZET) 180-10 MG TABS Take 1 tablet by mouth daily. 04/16/20   SBelva Crome MD  Blood Glucose Monitoring Suppl (ACCU-CHEK GUIDE) w/Device KIT 1 kit by Does not apply route 3 (three) times daily. To check blood sugars 07/05/19   Fulp, Cammie, MD  cariprazine (VRAYLAR) capsule 3 mg daily. 4.5 mg daily    [provider]  CHANTIX STARTING MONTH PAK 0.5 MG X 11 & 1 MG X 42 tablet  04/03/19   [provider]  ciprofloxacin (CIPRO) 500 MG tablet Take 1 tablet (500 mg total) by mouth 2 (two) times daily. 12/07/19   JLadell Pier MD  Cyanocobalamin (VITAMIN B 12 PO) Take 1 tablet by mouth daily.     [provider]  doxepin (SINEQUAN) 75 MG capsule Take 75 mg by mouth at bedtime. 03/02/18   [provider]  EPINEPHrine 0.3 mg/0.3 mL IJ SOAJ injection Inject 0.3 mg as directed as needed (severe allergic reaction).  01/11/14   [provider]  glucose blood (ACCU-CHEK GUIDE) test strip Use as instructed 07/05/19   Fulp,  Cammie, MD  levothyroxine (SYNTHROID) 112 MCG tablet TAKE 1 TABLET EVERY DAY (MUST HAVE OFFICE VISIT FOR FUTURE REFILLS) 04/16/20   Fulp, Cammie, MD  linaclotide (LINZESS) 290 MCG CAPS capsule Take 290 mcg by mouth at bedtime.    [provider]  metoprolol tartrate (LOPRESSOR) 100 MG tablet Take one tablet by mouth 2 hours prior to CT 08/27/19   SBelva Crome MD  morphine (MS CONTIN) 15 MG 12 hr tablet Take 1 tablet (15 mg total) by mouth every 12 (twelve) hours. 11/14/14   TBayard Hugger NP  Multiple Vitamin (MULTIVITAMIN) tablet Take  1 tablet by mouth every morning.     [provider]  nabumetone (RELAFEN) 750 MG tablet Take 750 mg by mouth 2 (two) times daily. 08/08/19   [provider]  nitroGLYCERIN (NITROSTAT) 0.4 MG SL tablet DISSOLVE 1 TABLET UNDER THE TONGUE EVERY 5 MINUTES AS NEEDED 10/08/19   Belva Crome, MD  olmesartan-hydrochlorothiazide (BENICAR HCT) 20-12.5 MG tablet TAKE 1 TABLET BY MOUTH DAILY 12/29/19   Fulp, Cammie, MD  omeprazole (PRILOSEC) 40 MG capsule TAKE 1 CAPSULE(40 MG) BY MOUTH DAILY 01/22/20   Fulp, Cammie, MD  Oxycodone HCl 10 MG TABS Take 1 tablet (10 mg total) by mouth every 6 (six) hours as needed (for pain). 11/14/14   Bayard Hugger, NP  pregabalin (LYRICA) 100 MG capsule Take 1 capsule (100 mg total) by mouth 3 (three) times daily. 08/08/19   Argentina Donovan, PA-C  umeclidinium-vilanterol (ANORO ELLIPTA) 62.5-25 MCG/INH AEPB Inhale 1 puff into the lungs daily. 04/17/19   Baird Lyons D, MD  VOLTAREN 1 % GEL Apply 4 g topically 4 (four) times daily. 12/25/17   Tereasa Coop, PA-C  vortioxetine HBr (TRINTELLIX) 10 MG TABS tablet 10 mg daily.    [provider]  escitalopram (LEXAPRO) 20 MG tablet Take 20 mg by mouth daily.  12/14/18  [provider]  LATUDA 120 MG TABS Take 120 mg by mouth at bedtime.  03/04/18 12/14/18  [provider]  LISINOPRIL PO Take by mouth daily.    09/06/11  [provider]   metoCLOPramide (REGLAN) 10 MG tablet Take 1 tablet (10 mg total) by mouth every 8 (eight) hours as needed for nausea. Patient not taking: Reported on 12/06/2019 06/30/18 05/12/20  Mesner, Corene Cornea, MD    Family History Family History  Problem Relation Age of Onset  . Cancer Father        colon  . Stroke Father   . Heart disease Father   . Diabetes Father   . Hypertension Father   . Depression Sister   . Hypertension Mother   . Breast cancer Neg Hx     Social History Social History   Tobacco Use  . Smoking status: Former Smoker    Packs/day: 0.25    Years: 43.00    Pack years: 10.75    Types: Cigarettes    Quit date: 2020    Years since quitting: 2.1  . Smokeless tobacco: Never Used  Vaping Use  . Vaping Use: Never used  Substance Use Topics  . Alcohol use: Yes    Comment: social  . Drug use: No     Allergies   Aspirin, Bee venom, Sulfa antibiotics, and Other   Review of Systems Review of Systems PER HPI   Physical Exam Triage Vital Signs ED Triage Vitals  Enc Vitals Group     BP 08/05/20 0921 (!) 145/89     Pulse Rate 08/05/20 0921 85     Resp --      Temp 08/05/20 0921 99 F (37.2 C)     Temp Source 08/05/20 0921 Oral     SpO2 08/05/20 0921 100 %     Weight --      Height --      Head Circumference --      Peak Flow --      Pain Score 08/05/20 0920 9     Pain Loc --      Pain Edu? --      Excl. in [place]? --  No data found.  Updated Vital Signs BP (!) 145/89 (BP Location: Right Arm)   Pulse 85   Temp 99 F (37.2 C) (Oral)   SpO2 100%   Visual Acuity Right Eye Distance:   Left Eye Distance:   Bilateral Distance:    Right Eye Near:   Left Eye Near:    Bilateral Near:     Physical Exam Vitals and nursing note reviewed.  Constitutional:      Appearance: Normal appearance. She is not ill-appearing.  HENT:     Head: Atraumatic.  Eyes:     Extraocular Movements: Extraocular movements intact.     Conjunctiva/sclera: Conjunctivae  normal.  Cardiovascular:     Rate and Rhythm: Normal rate and regular rhythm.     Pulses: Normal pulses.     Heart sounds: Normal heart sounds.  Pulmonary:     Effort: Pulmonary effort is normal.     Breath sounds: Normal breath sounds.  Musculoskeletal:        General: Normal range of motion.     Cervical back: Normal range of motion and neck supple.     Comments: S/p total right knee replacement Right medial knee edema, significant ttp ROM intact but very painful and stiff, crepitus present  Skin:    General: Skin is warm and dry.  Neurological:     Mental Status: She is alert and oriented to person, place, and time.     Sensory: No sensory deficit.  Psychiatric:        Mood and Affect: Mood normal.        Thought Content: Thought content normal.        Judgment: Judgment normal.     UC Treatments / Results  Labs (all labs ordered are listed, but only abnormal results are displayed) Labs Reviewed - No data to display  EKG   Radiology No results found.  Procedures Procedures (including critical care time)  Medications Ordered in UC Medications  ketorolac (TORADOL) 30 MG/ML injection 30 mg (30 mg Intramuscular Given 08/05/20 1033)    Initial Impression / Assessment and Plan / UC Course  I have reviewed the triage vital signs and the nursing notes.  Pertinent labs & imaging results that were available during my care of the patient were reviewed by me and considered in my medical decision making (see chart for details).     Acute on chronic issue, will give IM toradol, very small burst of prednisone which she was counseled to watch BSs closely, and f/u with Orthopedics for next steps given severity of her pain and ADL limitations from this issue. Continue with pain management as she has been additionally.   Final Clinical Impressions(s) / UC Diagnoses   Final diagnoses:  Right leg pain   Discharge Instructions   None    ED Prescriptions    Medication Sig  Dispense Auth. Provider   predniSONE (DELTASONE) 20 MG tablet Take 2 tablets (40 mg total) by mouth daily with breakfast. 6 tablet Volney American, [place]     PDMP not reviewed this encounter.   Volney American, [place] 08/05/20 1135

## 2020-08-07 ENCOUNTER — Ambulatory Visit (INDEPENDENT_AMBULATORY_CARE_PROVIDER_SITE_OTHER): Payer: Medicare HMO | Admitting: Podiatrist

## 2020-08-07 ENCOUNTER — Encounter: Payer: Self-pay | Admitting: Podiatrist

## 2020-08-07 ENCOUNTER — Other Ambulatory Visit: Payer: Self-pay

## 2020-08-07 DIAGNOSIS — L600 Ingrowing nail: Secondary | ICD-10-CM | POA: Diagnosis not present

## 2020-08-07 DIAGNOSIS — M5136 Other intervertebral disc degeneration, lumbar region: Secondary | ICD-10-CM | POA: Insufficient documentation

## 2020-08-07 MED ORDER — CICLOPIROX 8 % EX SOLN: Freq: Every day | CUTANEOUS | 0 refills | Status: DC

## 2020-08-07 NOTE — Progress Notes (Signed)
Chief Complaint  Patient presents with  . Nail Problem    Lt hallux medial border x 3 mo; 10/10 throbbing pain -getting worse -worse with shoes -w/ redness and swelling -pt denies drainage/heat tx: none   . Diabetes    FBS: 124 a1C: 6.3     HPI: Patient is 60 y.o. female who presents today for the concerns as listed above.  She has had worsening pain on the left great toenail for 3 months duration. She had a similar problem on the right hallux nail which Dr. Paulla Dolly fixed with removal of the nail borders.  She would like the same procedure performed for her left great toenail.   Patient Active Problem List   Diagnosis Date Noted  . Degeneration of lumbar intervertebral disc 08/07/2020  . Essential (primary) hypertension 05/07/2020  . Other chronic pain 02/14/2020  . Elevated blood-pressure reading, without diagnosis of hypertension 02/13/2020  . Hyperlipidemia 09/17/2019  . COPD mixed type ([place]) 04/17/2019  . Tobacco user 04/17/2019  . Nocturnal hypoxemia 12/25/2018  . Muscle weakness 12/05/2018  . Change in bowel habits 03/14/2018  . Chest pain 01/26/2016  . Hallucination, drug-induced ([place]) 03/12/2015  . Hypothyroidism 03/12/2015  . Chronic low back pain 01/29/2015  . Lumbar spondylosis 01/29/2015  . Lap gastric bypass June 2016 11/25/2014  . Diabetes mellitus with neuropathy ([place]) 06/20/2013  . Chronic pain of right knee 06/20/2013  . Morbid obesity ([place]) 07/05/2012    Current Outpatient Medications on File Prior to Visit  Medication Sig Dispense Refill  . naloxone (NARCAN) nasal spray 4 mg/0.1 mL Narcan 4 mg/actuation nasal spray  USE 1 SPRAY INTRANASALLY IN 1 NOSTRIL. MAY REPEAT DOSE EVERY 2 TO 3 MINUTES AS NEEDED ALTERNATING NOSTRILS FOR OVERDOSE    . albuterol (PROVENTIL HFA;VENTOLIN HFA) 108 (90 BASE) MCG/ACT inhaler Inhale 2 puffs into the lungs every 6 (six) hours as needed for wheezing or shortness of breath. Shortness of breath    . Alirocumab (PRALUENT) 150 MG/ML  SOAJ Inject 1 pen into the skin every 14 (fourteen) days. 6 mL 3  . ALPRAZolam (XANAX) 1 MG tablet Take 1 mg by mouth 4 (four) times daily.   0  . amphetamine-dextroamphetamine (ADDERALL XR) 30 MG 24 hr capsule Take 30 mg by mouth daily.     Marland Kitchen amphetamine-dextroamphetamine (ADDERALL) 20 MG tablet Take 10 mg by mouth every evening. Pt. Is taking 73m in the evening.  0  . Bempedoic Acid-Ezetimibe (NEXLIZET) 180-10 MG TABS Take 1 tablet by mouth daily. 90 tablet 3  . Blood Glucose Monitoring Suppl (ACCU-CHEK GUIDE) w/Device KIT 1 kit by Does not apply route 3 (three) times daily. To check blood sugars 1 kit 0  . cariprazine (VRAYLAR) capsule 3 mg daily. 4.5 mg daily    . CHANTIX STARTING MONTH PAK 0.5 MG X 11 & 1 MG X 42 tablet     . ciprofloxacin (CIPRO) 500 MG tablet Take 1 tablet (500 mg total) by mouth 2 (two) times daily. 10 tablet 0  . Cyanocobalamin (VITAMIN B 12 PO) Take 1 tablet by mouth daily.     . diclofenac Sodium (VOLTAREN) 1 % GEL Apply topically.    .Marland Kitchendoxepin (SINEQUAN) 75 MG capsule Take 75 mg by mouth at bedtime.  0  . doxycycline (VIBRA-TABS) 100 MG tablet doxycycline hyclate 100 mg tablet  TAKE 1 TABLET BY MOUTH TWICE DAILY    . EPINEPHrine 0.3 mg/0.3 mL IJ SOAJ injection Inject 0.3 mg as directed as needed (severe allergic reaction).     .Marland Kitchen  ezetimibe (ZETIA) 10 MG tablet ezetimibe 10 mg tablet    . fluconazole (DIFLUCAN) 150 MG tablet fluconazole 150 mg tablet  TAKE 1 TABLET BY MOUTH EVERY DAY    . glucose blood (ACCU-CHEK GUIDE) test strip Use as instructed 100 each 12  . levothyroxine (SYNTHROID) 112 MCG tablet TAKE 1 TABLET EVERY DAY (MUST HAVE OFFICE VISIT FOR FUTURE REFILLS) 90 tablet 0  . linaclotide (LINZESS) 290 MCG CAPS capsule Take 290 mcg by mouth at bedtime.    . metoprolol tartrate (LOPRESSOR) 100 MG tablet Take one tablet by mouth 2 hours prior to CT 1 tablet 0  . morphine (MS CONTIN) 15 MG 12 hr tablet Take 1 tablet (15 mg total) by mouth every 12 (twelve)  hours. 60 tablet 0  . Multiple Vitamin (MULTIVITAMIN) tablet Take 1 tablet by mouth every morning.     . nabumetone (RELAFEN) 750 MG tablet Take 750 mg by mouth 2 (two) times daily.    . nitroGLYCERIN (NITROSTAT) 0.4 MG SL tablet DISSOLVE 1 TABLET UNDER THE TONGUE EVERY 5 MINUTES AS NEEDED 25 tablet 3  . olmesartan-hydrochlorothiazide (BENICAR HCT) 20-12.5 MG tablet TAKE 1 TABLET BY MOUTH DAILY 90 tablet 1  . omeprazole (PRILOSEC) 40 MG capsule TAKE 1 CAPSULE(40 MG) BY MOUTH DAILY 90 capsule 1  . Oxycodone HCl 10 MG TABS Take 1 tablet (10 mg total) by mouth every 6 (six) hours as needed (for pain). 120 tablet 0  . predniSONE (DELTASONE) 20 MG tablet Take 2 tablets (40 mg total) by mouth daily with breakfast. 6 tablet 0  . pregabalin (LYRICA) 100 MG capsule Take 1 capsule (100 mg total) by mouth 3 (three) times daily. 90 capsule 1  . umeclidinium-vilanterol (ANORO ELLIPTA) 62.5-25 MCG/INH AEPB Inhale 1 puff into the lungs daily. 1 each 0  . VOLTAREN 1 % GEL Apply 4 g topically 4 (four) times daily. 100 g 11  . vortioxetine HBr (TRINTELLIX) 10 MG TABS tablet 10 mg daily.    . [DISCONTINUED] escitalopram (LEXAPRO) 20 MG tablet Take 20 mg by mouth daily.    . [DISCONTINUED] LATUDA 120 MG TABS Take 120 mg by mouth at bedtime.   2  . [DISCONTINUED] LISINOPRIL PO Take by mouth daily.      . [DISCONTINUED] metoCLOPramide (REGLAN) 10 MG tablet Take 1 tablet (10 mg total) by mouth every 8 (eight) hours as needed for nausea. (Patient not taking: Reported on 12/06/2019) 30 tablet 0   No current facility-administered medications on file prior to visit.    Allergies  Allergen Reactions  . Aspirin Anaphylaxis and Other (See Comments)    Pt states she has had Toradol several times without any reactions  . Bee Venom Anaphylaxis  . Sulfa Antibiotics Anaphylaxis  . Other Other (See Comments)    No blood products.  Patient did  Request that only albumin or albumin-containing products may be administered     Review of Systems No fevers, chills, nausea, muscle aches, no difficulty breathing, no calf pain, no chest pain or shortness of breath.   Physical Exam  GENERAL APPEARANCE: Alert, conversant. Appropriately groomed. No acute distress.   VASCULAR: Pedal pulses palpable DP and PT bilateral.  Capillary refill time is immediate to all digits,  Proximal to distal cooling it warm to warm.  Digital perfusion adequate.   NEUROLOGIC: sensation is intact to 5.07 monofilament at 5/5 sites bilateral.  Light touch is intact bilateral, vibratory sensation intact bilateral  MUSCULOSKELETAL: acceptable muscle strength, tone and stability bilateral.  No gross boney pedal deformities noted.  No pain, crepitus or limitation noted with foot and ankle range of motion bilateral.   DERMATOLOGIC: skin is warm, supple, and dry.  No open lesions noted.  No rash, no pre ulcerative lesions. Left hallux nail is incurvated on the medial and lateral borders causing pain with pressure from shoes and with palpation. No infection noted today.  Pain in the nail for 3 months is reported.  Remainder of toenails are thick and appear mycotic.     Assessment     ICD-10-CM   1. Ingrown left greater toenail  L60.0      Plan  Treatment options and alternatives were discussed. Recommended a permanent removal of the medial and lateral nail border of the left great toenail.  Patient agreed. Skin was prepped with alcohol and a local injection of lidocaine and Marcaine plain was infiltrated to anesthetize the toe. The toe was then prepped with Betadine exsanguinated. The offending nail borders medial and lateral are removed and phenol applied.  It was cleansed well with alcohol. Antibiotic ointment and a dressing was then applied and the patient was given instructions for aftercare.   rx for penlac and recommendation to use this with [facility]andy [facility] discussed with the patient for the mycotic toenails.  She will hold off on applying to  the left hallux nail until around 4 weeks.

## 2020-08-07 NOTE — Patient Instructions (Addendum)
  I have called in Penlac [facility] [place] as directed on bottle.  The other over the counter option is called [facility] -  It is also a topical nail application.  You may also apply this medication in the morning and Penlac at night which is a good combination.   Soak Instructions    THE DAY AFTER THE PROCEDURE  Place 1/4 cup of epsom salts in a quart of warm tap water.  Submerge your foot or feet with outer bandage intact for the initial soak; this will allow the bandage to become moist and wet for easy lift off.  Once you remove your bandage, continue to soak in the solution for 20 minutes.  This soak should be done twice a day.  Next, remove your foot or feet from solution, blot dry the affected area and cover.  Apply polysporin or neosporin.   You may use a band aid large enough to cover the area or use gauze and tape.     IF YOUR SKIN BECOMES IRRITATED WHILE USING THESE INSTRUCTIONS, IT IS OKAY TO SWITCH TO  antibacterial soap pump soap (Dial)  and water to keep the toe clean instead of soaking in epsom salts.

## 2020-08-11 ENCOUNTER — Other Ambulatory Visit: Payer: Self-pay | Admitting: Family Medicine

## 2020-08-11 DIAGNOSIS — I10 Essential (primary) hypertension: Secondary | ICD-10-CM

## 2020-08-12 DIAGNOSIS — T402X5A Adverse effect of other opioids, initial encounter: Secondary | ICD-10-CM | POA: Diagnosis not present

## 2020-08-12 DIAGNOSIS — M5416 Radiculopathy, lumbar region: Secondary | ICD-10-CM | POA: Diagnosis not present

## 2020-08-12 DIAGNOSIS — K5903 Drug induced constipation: Secondary | ICD-10-CM | POA: Diagnosis not present

## 2020-08-12 DIAGNOSIS — M1712 Unilateral primary osteoarthritis, left knee: Secondary | ICD-10-CM | POA: Diagnosis not present

## 2020-08-14 ENCOUNTER — Other Ambulatory Visit: Payer: Self-pay | Admitting: Family Medicine

## 2020-08-14 DIAGNOSIS — I10 Essential (primary) hypertension: Secondary | ICD-10-CM

## 2020-08-20 ENCOUNTER — Other Ambulatory Visit: Payer: Self-pay

## 2020-08-20 ENCOUNTER — Ambulatory Visit: Payer: Medicare HMO | Admitting: Nurse Practitioner

## 2020-08-21 ENCOUNTER — Other Ambulatory Visit: Payer: Self-pay

## 2020-08-21 ENCOUNTER — Encounter: Payer: Self-pay | Admitting: Family Medicine

## 2020-08-21 ENCOUNTER — Ambulatory Visit: Payer: Medicare HMO | Attending: Nurse Practitioner | Admitting: Family Medicine

## 2020-08-21 VITALS — BP 140/84 | HR 70 | Ht 65.0 in | Wt 228.0 lb

## 2020-08-21 DIAGNOSIS — E1140 Type 2 diabetes mellitus with diabetic neuropathy, unspecified: Secondary | ICD-10-CM

## 2020-08-21 DIAGNOSIS — Z9884 Bariatric surgery status: Secondary | ICD-10-CM | POA: Diagnosis not present

## 2020-08-21 DIAGNOSIS — E039 Hypothyroidism, unspecified: Secondary | ICD-10-CM | POA: Diagnosis not present

## 2020-08-21 DIAGNOSIS — N951 Menopausal and female climacteric states: Secondary | ICD-10-CM | POA: Diagnosis not present

## 2020-08-21 DIAGNOSIS — N3946 Mixed incontinence: Secondary | ICD-10-CM | POA: Diagnosis not present

## 2020-08-21 DIAGNOSIS — M792 Neuralgia and neuritis, unspecified: Secondary | ICD-10-CM

## 2020-08-21 DIAGNOSIS — N952 Postmenopausal atrophic vaginitis: Secondary | ICD-10-CM

## 2020-08-21 LAB — POCT GLYCOSYLATED HEMOGLOBIN (HGB A1C): HbA1c, POC (controlled diabetic range): 5.8 % (ref 0.0–7.0)

## 2020-08-21 MED ORDER — OXYBUTYNIN CHLORIDE 5 MG PO TABS: 5.0000 mg | ORAL_TABLET | Freq: Two times a day (BID) | ORAL | 3 refills | Status: DC

## 2020-08-21 MED ORDER — PREGABALIN 100 MG PO CAPS: 100.0000 mg | ORAL_CAPSULE | Freq: Three times a day (TID) | ORAL | 1 refills | Status: DC

## 2020-08-21 MED ORDER — ALBUTEROL SULFATE HFA 108 (90 BASE) MCG/ACT IN AERS: 2.0000 | INHALATION_SPRAY | Freq: Four times a day (QID) | RESPIRATORY_TRACT | 3 refills | Status: DC | PRN

## 2020-08-21 MED ORDER — CLONIDINE HCL 0.1 MG PO TABS: 0.1000 mg | ORAL_TABLET | Freq: Every day | ORAL | 3 refills | Status: DC

## 2020-08-21 MED ORDER — ATORVASTATIN CALCIUM 20 MG PO TABS: 20.0000 mg | ORAL_TABLET | Freq: Every day | ORAL | 3 refills | Status: DC

## 2020-08-21 MED ORDER — ESTRADIOL 0.1 MG/GM VA CREA: TOPICAL_CREAM | VAGINAL | 1 refills | Status: DC

## 2020-08-21 NOTE — Patient Instructions (Signed)
Estradiol vaginal cream What is this medicine? ESTRADIOL (es tra DYE ole) contains the female hormone estrogen. It is used for symptoms of menopause, like vaginal dryness and irritation. This medicine may be used for other purposes; ask your health care provider or pharmacist if you have questions. COMMON BRAND NAME(S): Estrace What should I tell my health care provider before I take this medicine? They need to know if you have any of these conditions:  abnormal vaginal bleeding  blood vessel disease or blood clots  breast, cervical, endometrial, ovarian, liver, or uterine cancer  dementia  diabetes  gallbladder disease  heart disease or recent heart attack  high blood pressure  high cholesterol  high levels of calcium in the blood  hysterectomy  kidney disease  liver disease  migraine headaches  protein C deficiency  protein S deficiency  stroke  systemic lupus erythematosus (SLE)  tobacco smoker  an unusual or allergic reaction to estrogens, other hormones, soy, other medicines, foods, dyes, or preservatives  pregnant or trying to get pregnant  breast-feeding How should I use this medicine? This medicine is for use in the vagina only. Do not take by mouth. Follow the directions on the prescription label. Read package directions carefully before using. Use the special applicator supplied with the cream. Wash hands before and after use. Fill the applicator with the prescribed amount of cream. Lie on your back, part and bend your knees. Insert the applicator into the vagina and push the plunger to expel the cream into the vagina. Wash the applicator with warm soapy water and rinse well. Use exactly as directed for the complete length of time prescribed. Do not stop using except on the advice of your doctor or health care professional. A patient package insert for the product will be given with each prescription and refill. Read this sheet carefully each time. The  sheet may change frequently. Talk to your pediatrician regarding the use of this medicine in children. This medicine is not approved for use in children. Overdosage: If you think you have taken too much of this medicine contact a poison control center or emergency room at once. NOTE: This medicine is only for you. Do not share this medicine with others. What if I miss a dose? If you miss a dose, use it as soon as you can. If it is almost time for your next dose, use only that dose. Do not use double or extra doses. What may interact with this medicine? Do not take this medicine with any of the following medications:  aromatase inhibitors like aminoglutethimide, anastrozole, exemestane, letrozole, testolactone This medicine may also interact with the following medications:  barbiturates used for inducing sleep or treating seizures  carbamazepine  grapefruit juice  medicines for fungal infections like ketoconazole and itraconazole  raloxifene  rifabutin  rifampin  rifapentine  ritonavir  some antibiotics used to treat infections  St. John's Wort  tamoxifen  warfarin This list may not describe all possible interactions. Give your health care provider a list of all the medicines, herbs, non-prescription drugs, or dietary supplements you use. Also tell them if you smoke, drink alcohol, or use illegal drugs. Some items may interact with your medicine. What should I watch for while using this medicine? Visit your health care professional for regular checks on your progress. You will need a regular breast and pelvic exam. You should also discuss the need for regular mammograms with your health care professional, and follow his or her guidelines. This medicine   can make your body retain fluid, making your fingers, hands, or ankles swell. Your blood pressure can go up. Contact your doctor or health care professional if you feel you are retaining fluid. If you have any reason to think  you are pregnant, stop taking this medicine at once and contact your doctor or health care professional. Tobacco smoking increases the risk of getting a blood clot or having a stroke, especially if you are more than 61 years old. You are strongly advised not to smoke. If you wear contact lenses and notice visual changes, or if the lenses begin to feel uncomfortable, consult your eye care specialist. If you are going to have elective surgery, you may need to stop taking this medicine beforehand. Consult your health care professional for advice prior to scheduling the surgery. What side effects may I notice from receiving this medicine? Side effects that you should report to your doctor or health care professional as soon as possible:  allergic reactions like skin rash, itching or hives, swelling of the face, lips, or tongue  breast tissue changes or discharge  changes in vision  chest pain  confusion, trouble speaking or understanding  dark urine  general ill feeling or flu-like symptoms  light-colored stools  nausea, vomiting  pain, swelling, warmth in the leg  right upper belly pain  severe headaches  shortness of breath  sudden numbness or weakness of the face, arm or leg  trouble walking, dizziness, loss of balance or coordination  unusual vaginal bleeding  yellowing of the eyes or skin Side effects that usually do not require medical attention (report to your doctor or health care professional if they continue or are bothersome):  hair loss  increased hunger or thirst  increased urination  symptoms of vaginal infection like itching, irritation or unusual discharge  unusually weak or tired This list may not describe all possible side effects. Call your doctor for medical advice about side effects. You may report side effects to FDA at 1-800-FDA-1088. Where should I keep my medicine? Keep out of the reach of children. Store at room temperature between 15 and 30  degrees C (59 and 86 degrees F). Protect from temperatures above 40 degrees C (104 degrees C). Do not freeze. Throw away any unused medicine after the expiration date. NOTE: This sheet is a summary. It may not cover all possible information. If you have questions about this medicine, talk to your doctor, pharmacist, or health care provider.  2021 Elsevier/Gold Standard (2010-09-02 09:18:12)  

## 2020-08-21 NOTE — Progress Notes (Signed)
Subjective:  Patient ID: Caroline Williams, female    DOB: March 30, 1961  Age: 60 y.o. MRN: [id number]  CC:  Chief Complaint  Patient presents with  . Urinary Incontinence     HPI Caroline Williams is a 60 year old female with a history of type 2 diabetes mellitus, hypothyroidism, bipolar disorder, hypertension who presents today to establish care and complains of the following symptoms.  She is unable to control her urine. Complains of nocturia and also has increased frequency, accidents but she has no dysuria. Endorses the presence of stress incontinence Symptoms have been present for the last 6 months She has dyspareunia and it causes her to urinate during sex as well. Lubricants have been ineffective. Sex is 'terrible' she says.  She sees Mental Health for Bipolar disorder Followed by Pain Mgt for osteoarthritis of both knees s/p R knee surgery She is requesting a refill of Lyrica.  H/o gastric bypass and needs a referral to a Nutritionist whom she saw in the past and would like to continue seeing them. Her diabetes is diet controlled.  Past Medical History:  Diagnosis Date  . Anxiety   . Arthritis   . Asthma   . Bipolar 1 disorder ([place])   . Breast discharge   . Breast lump   . Breast pain   . Chronic pain    on MS Contin and oxycodone  . Depression   . Diabetes mellitus    diet and exercise controlled  . Escherichia coli (E. coli) infection   . Fever   . GERD (gastroesophageal reflux disease)   . History of chest pain   . History of kidney stones   . History of knee replacement, total   . Hypertension   . Hypothyroidism   . N&V (nausea and vomiting)   . Peripheral neuropathy    Right foot no sensation  . Pneumonia    2015,2014  . Sleep apnea    does not use CPAP  . Thyroid disease   . Wears glasses     Past Surgical History:  Procedure Laterality Date  . ABDOMINAL HYSTERECTOMY     partial  . APPENDECTOMY    . BIOPSY  03/14/2018   Procedure: BIOPSY;   Surgeon: Wilford Corner, MD;  Location: [facility];  Service: Endoscopy;;  . BREAST DUCTAL SYSTEM EXCISION Right 08/31/2017   Procedure: RIGHT BREAST CENTRAL DUCT EXCISION;  Surgeon: Erroll Luna, MD;  Location: [facility];  Service: General;  Laterality: Right;  . BREAST EXCISIONAL BIOPSY Right   . BREAST LUMPECTOMY     right  . CARDIAC CATHETERIZATION     no significant CAD, nl LV function by 11/08/06 cath  . CESAREAN SECTION     x4  . COLONOSCOPY WITH PROPOFOL N/A 03/14/2018   Procedure: COLONOSCOPY WITH PROPOFOL;  Surgeon: Wilford Corner, MD;  Location: [facility];  Service: Endoscopy;  Laterality: N/A;  . GASTRIC ROUX-EN-Y N/A 11/25/2014   Procedure: LAPAROSCOPIC ROUX-EN-Y GASTRIC BYPASS WITH UPPER ENDOSCOPY;  Surgeon: Johnathan Hausen, MD;  Location: [facility];  Service: General;  Laterality: N/A;  . HERNIA REPAIR    . JOINT REPLACEMENT    . KNEE SURGERY     right  . MOUTH SURGERY    . [place]  . POLYPECTOMY  03/14/2018   Procedure: POLYPECTOMY;  Surgeon: Wilford Corner, MD;  Location: [facility];  Service: Endoscopy;;  . TOTAL KNEE REVISION  06/21/2012   Procedure: TOTAL KNEE REVISION;  Surgeon: Illene Regulus  Sharol Given, MD;  Location: [facility];  Service: Orthopedics;  Laterality: Right;  Revision Right Total Knee Arthroplasty    Family History  Problem Relation Age of Onset  . Cancer Father        colon  . Stroke Father   . Heart disease Father   . Diabetes Father   . Hypertension Father   . Depression Sister   . Hypertension Mother   . Breast cancer Neg Hx     Allergies  Allergen Reactions  . Aspirin Anaphylaxis and Other (See Comments)    Pt states she has had Toradol several times without any reactions  . Bee Venom Anaphylaxis  . Sulfa Antibiotics Anaphylaxis  . Other Other (See Comments)    No blood products.  Patient did  Request that only albumin or albumin-containing products may be administered    Outpatient  Medications Prior to Visit  Medication Sig Dispense Refill  . albuterol (PROVENTIL HFA;VENTOLIN HFA) 108 (90 BASE) MCG/ACT inhaler Inhale 2 puffs into the lungs every 6 (six) hours as needed for wheezing or shortness of breath. Shortness of breath    . Alirocumab (PRALUENT) 150 MG/ML SOAJ Inject 1 pen into the skin every 14 (fourteen) days. 6 mL 3  . ALPRAZolam (XANAX) 1 MG tablet Take 1 mg by mouth 4 (four) times daily.   0  . amLODipine (NORVASC) 5 MG tablet TAKE 1 TABLET(5 MG) BY MOUTH DAILY 10 tablet 0  . amphetamine-dextroamphetamine (ADDERALL XR) 30 MG 24 hr capsule Take 30 mg by mouth daily.     Marland Kitchen amphetamine-dextroamphetamine (ADDERALL) 20 MG tablet Take 10 mg by mouth every evening. Pt. Is taking 20m in the evening.  0  . Bempedoic Acid-Ezetimibe (NEXLIZET) 180-10 MG TABS Take 1 tablet by mouth daily. 90 tablet 3  . Blood Glucose Monitoring Suppl (ACCU-CHEK GUIDE) w/Device KIT 1 kit by Does not apply route 3 (three) times daily. To check blood sugars 1 kit 0  . cariprazine (VRAYLAR) capsule 3 mg daily. 4.5 mg daily    . CHANTIX STARTING MONTH PAK 0.5 MG X 11 & 1 MG X 42 tablet     . ciprofloxacin (CIPRO) 500 MG tablet Take 1 tablet (500 mg total) by mouth 2 (two) times daily. 10 tablet 0  . Cyanocobalamin (VITAMIN B 12 PO) Take 1 tablet by mouth daily.     . diclofenac Sodium (VOLTAREN) 1 % GEL Apply topically.    .Marland Kitchendoxepin (SINEQUAN) 75 MG capsule Take 75 mg by mouth at bedtime.  0  . doxycycline (VIBRA-TABS) 100 MG tablet doxycycline hyclate 100 mg tablet  TAKE 1 TABLET BY MOUTH TWICE DAILY    . EPINEPHrine 0.3 mg/0.3 mL IJ SOAJ injection Inject 0.3 mg as directed as needed (severe allergic reaction).     . ezetimibe (ZETIA) 10 MG tablet ezetimibe 10 mg tablet    . glucose blood (ACCU-CHEK GUIDE) test strip Use as instructed 100 each 12  . levothyroxine (SYNTHROID) 112 MCG tablet TAKE 1 TABLET EVERY DAY (MUST HAVE OFFICE VISIT FOR FUTURE REFILLS) 90 tablet 0  . linaclotide (LINZESS)  290 MCG CAPS capsule Take 290 mcg by mouth at bedtime.    . metoprolol tartrate (LOPRESSOR) 100 MG tablet Take one tablet by mouth 2 hours prior to CT 1 tablet 0  . morphine (MS CONTIN) 15 MG 12 hr tablet Take 1 tablet (15 mg total) by mouth every 12 (twelve) hours. 60 tablet 0  . Multiple Vitamin (MULTIVITAMIN) tablet Take 1 tablet by  mouth every morning.     . nabumetone (RELAFEN) 750 MG tablet Take 750 mg by mouth 2 (two) times daily.    . naloxone (NARCAN) nasal spray 4 mg/0.1 mL Narcan 4 mg/actuation nasal spray  USE 1 SPRAY INTRANASALLY IN 1 NOSTRIL. MAY REPEAT DOSE EVERY 2 TO 3 MINUTES AS NEEDED ALTERNATING NOSTRILS FOR OVERDOSE    . nitroGLYCERIN (NITROSTAT) 0.4 MG SL tablet DISSOLVE 1 TABLET UNDER THE TONGUE EVERY 5 MINUTES AS NEEDED 25 tablet 3  . olmesartan-hydrochlorothiazide (BENICAR HCT) 20-12.5 MG tablet TAKE 1 TABLET BY MOUTH DAILY 90 tablet 1  . omeprazole (PRILOSEC) 40 MG capsule TAKE 1 CAPSULE(40 MG) BY MOUTH DAILY 90 capsule 1  . Oxycodone HCl 10 MG TABS Take 1 tablet (10 mg total) by mouth every 6 (six) hours as needed (for pain). 120 tablet 0  . pregabalin (LYRICA) 100 MG capsule Take 1 capsule (100 mg total) by mouth 3 (three) times daily. 90 capsule 1  . umeclidinium-vilanterol (ANORO ELLIPTA) 62.5-25 MCG/INH AEPB Inhale 1 puff into the lungs daily. 1 each 0  . VOLTAREN 1 % GEL Apply 4 g topically 4 (four) times daily. 100 g 11  . vortioxetine HBr (TRINTELLIX) 10 MG TABS tablet 10 mg daily.    . ciclopirox (PENLAC) 8 % solution Apply topically at bedtime. Apply over nail and surrounding skin. Apply daily over previous coat. After seven (7) days, may remove with alcohol and continue cycle. (Patient not taking: Reported on 08/21/2020) 6.6 mL 0  . fluconazole (DIFLUCAN) 150 MG tablet fluconazole 150 mg tablet  TAKE 1 TABLET BY MOUTH EVERY DAY (Patient not taking: Reported on 08/21/2020)    . predniSONE (DELTASONE) 20 MG tablet Take 2 tablets (40 mg total) by mouth daily with  breakfast. (Patient not taking: Reported on 08/21/2020) 6 tablet 0   No facility-administered medications prior to visit.     ROS Review of Systems  Constitutional: Negative for activity change, appetite change and fatigue.  HENT: Negative for congestion, sinus pressure and sore throat.   Eyes: Negative for visual disturbance.  Respiratory: Negative for cough, chest tightness, shortness of breath and wheezing.   Cardiovascular: Negative for chest pain and palpitations.  Gastrointestinal: Negative for abdominal distention, abdominal pain and constipation.  Endocrine: Negative for polydipsia.  Genitourinary: Positive for dyspareunia. Negative for dysuria and frequency.  Musculoskeletal: Positive for arthralgias. Negative for back pain.  Skin: Negative for rash.  Neurological: Negative for tremors, light-headedness and numbness.  Hematological: Does not bruise/bleed easily.  Psychiatric/Behavioral: Negative for agitation and behavioral problems.    Objective:  BP 140/84   Pulse 70   Ht _0  (1.651 m)   Wt 228 lb (103.4 kg)   SpO2 96%   BMI 37.94 kg/m   BP/Weight 08/21/2020 08/05/2020 61/95/0932  Systolic BP 671 245 809  Diastolic BP 84 89 81  Wt. (Lbs) 228 - -  BMI 37.94 - -      Physical Exam Constitutional:      Appearance: She is well-developed.  Neck:     Vascular: No JVD.  Cardiovascular:     Rate and Rhythm: Normal rate.     Heart sounds: Normal heart sounds. No murmur heard.   Pulmonary:     Effort: Pulmonary effort is normal.     Breath sounds: Normal breath sounds. No wheezing or rales.  Chest:     Chest wall: No tenderness.  Abdominal:     General: Bowel sounds are normal. There is no distension.  Palpations: Abdomen is soft. There is no mass.     Tenderness: There is no abdominal tenderness.  Musculoskeletal:        General: Normal range of motion.     Right lower leg: No edema.     Left lower leg: No edema.  Neurological:     Mental Status:  She is alert and oriented to person, place, and time.  Psychiatric:        Mood and Affect: Mood normal.     CMP Latest Ref Rng & Units 07/14/2020 09/13/2019 07/06/2019  Glucose 65 - 99 mg/dL 87 91 97  BUN 8 - 27 mg/dL _0 Creatinine 0.57 - 1.00 mg/dL 1.15(H) 1.17(H) 1.05(H)  Sodium 134 - 144 mmol/L 144 140 143  Potassium 3.5 - 5.2 mmol/L 3.9 3.7 4.0  Chloride 96 - 106 mmol/L 102 100 102  CO2 20 - 29 mmol/L _1 Calcium 8.7 - 10.3 mg/dL 9.4 9.6 9.8  Total Protein 6.0 - 8.5 g/dL 6.9 - 7.1  Total Bilirubin 0.0 - 1.2 mg/dL 0.3 - 0.2  Alkaline Phos 44 - 121 IU/L 73 - 116  AST 0 - 40 IU/L 20 - 18  ALT 0 - 32 IU/L 18 - 16    Lipid Panel     Component Value Date/Time   CHOL 110 07/14/2020 0754   TRIG 54 07/14/2020 0754   HDL 69 07/14/2020 0754   CHOLHDL 1.6 07/14/2020 0754   LDLCALC 28 07/14/2020 0754   LDLDIRECT 84 04/10/2020 0759    CBC    Component Value Date/Time   WBC 5.5 07/06/2019 0850   WBC 5.0 06/29/2018 1835   RBC 5.06 07/06/2019 0850   RBC 5.68 (H) 06/29/2018 1835   HGB 14.2 07/06/2019 0850   HCT 42.9 07/06/2019 0850   PLT 277 07/06/2019 0850   MCV 85 07/06/2019 0850   MCH 28.1 07/06/2019 0850   MCH 26.8 06/29/2018 1835   MCHC 33.1 07/06/2019 0850   MCHC 31.6 06/29/2018 1835   RDW 14.0 07/06/2019 0850   LYMPHSABS 0.9 03/11/2015 2127   MONOABS 0.5 03/11/2015 2127   EOSABS 0.0 03/11/2015 2127   BASOSABS 0.0 03/11/2015 2127    Lab Results  Component Value Date   HGBA1C 5.8 08/21/2020    Lab Results  Component Value Date   TSH 18.100 (H) 01/11/2020    Assessment & Plan:  1. Type 2 diabetes mellitus with chronic painful diabetic neuropathy (HCC) Diet controlled with A1c of 5.8 She is currently not on a statin but is on Zetia and Praluent I have inquired over history of statin intolerance but she denies this Given LDL is above goal of less than 70 I have initiated atorvastatin Counseled on Diabetic diet, my plate method, 626 minutes of  moderate intensity exercise/week Blood sugar logs with fasting goals of 80-120 mg/dl, random of less than 180 and in the event of sugars less than 60 mg/dl or greater than 400 mg/dl encouraged to notify the clinic. Advised on the need for annual eye exams, annual foot exams, Pneumonia vaccine. - POCT glycosylated hemoglobin (Hb A1C) - atorvastatin (LIPITOR) 20 MG tablet; Take 1 tablet (20 mg total) by mouth daily.  Dispense: 30 tablet; Refill: 3  2. Lap gastric bypass June 2016 - Amb ref to Medical Nutrition Therapy-MNT  3. Hypothyroidism, unspecified type We will check thyroid labs and adjust regimen accordingly - T4, free - TSH  4. Vaginal atrophy Counseled on risk and benefit of hormone  replacement therapy She has no cardiac risk and no history of thromboembolic disorder Advised that this will need to be used for the shortest effective duration - estradiol (ESTRACE VAGINAL) 0.1 MG/GM vaginal cream; 1 g per vagina daily  Dispense: 42.5 g; Refill: 1  5. Vasomotor symptoms due to menopause Trial of clonidine If symptoms are uncontrolled will refer to GYN - cloNIDine (CATAPRES) 0.1 MG tablet; Take 1 tablet (0.1 mg total) by mouth at bedtime. For hot flashes  Dispense: 30 tablet; Refill: 3  6. Mixed stress and urge urinary incontinence Discussed Kegel exercises - oxybutynin (DITROPAN) 5 MG tablet; Take 1 tablet (5 mg total) by mouth 2 (two) times daily.  Dispense: 60 tablet; Refill: 3  7. Neuropathic pain - pregabalin (LYRICA) 100 MG capsule; Take 1 capsule (100 mg total) by mouth 3 (three) times daily.  Dispense: 90 capsule; Refill: 1   No orders of the defined types were placed in this encounter.   Return in about 3 months (around 11/21/2020) for Urinary incontinence.       Charlott Rakes, MD, FAAFP. [facility] [place], [place]   08/21/2020, 9:45 AM

## 2020-08-21 NOTE — Progress Notes (Signed)
trouble with bladder needs referral to urology Referral to OBGYN.

## 2020-08-22 ENCOUNTER — Other Ambulatory Visit: Payer: Self-pay | Admitting: Family Medicine

## 2020-08-22 ENCOUNTER — Telehealth: Payer: Self-pay

## 2020-08-22 DIAGNOSIS — E039 Hypothyroidism, unspecified: Secondary | ICD-10-CM

## 2020-08-22 LAB — TSH: TSH: 7.16 u[IU]/mL — ABNORMAL HIGH (ref 0.450–4.500)

## 2020-08-22 LAB — T4, FREE: Free T4: 1.5 ng/dL (ref 0.82–1.77)

## 2020-08-22 MED ORDER — LEVOTHYROXINE SODIUM 125 MCG PO TABS: 125.0000 ug | ORAL_TABLET | Freq: Every day | ORAL | 1 refills | Status: DC

## 2020-08-22 NOTE — Telephone Encounter (Signed)
-----   Message from Charlott Rakes, MD sent at 08/22/2020 10:25 AM EST ----- Please inform her that her thyroid level is abnormal and I have sent a prescription for an increased dose of levothyroxine to her pharmacy. Advise that Levothyroxine should be taken on an empty stomach first thing in the morning.

## 2020-08-22 NOTE — Telephone Encounter (Signed)
Patient was called and a voicemail was left informing patient to return phone call for lab results. 

## 2020-08-24 NOTE — Progress Notes (Signed)
Patient canceled appt

## 2020-08-26 DIAGNOSIS — M1712 Unilateral primary osteoarthritis, left knee: Secondary | ICD-10-CM | POA: Diagnosis not present

## 2020-08-27 DIAGNOSIS — F4001 Agoraphobia with panic disorder: Secondary | ICD-10-CM | POA: Diagnosis not present

## 2020-08-27 DIAGNOSIS — F250 Schizoaffective disorder, bipolar type: Secondary | ICD-10-CM | POA: Diagnosis not present

## 2020-08-29 ENCOUNTER — Telehealth: Payer: Self-pay

## 2020-08-29 NOTE — Telephone Encounter (Signed)
Patient was called and a voicemail was left informing patient to return phone call for lab results.   Letter will be mailed to pt.

## 2020-08-29 NOTE — Telephone Encounter (Signed)
-----   Message from Charlott Rakes, MD sent at 08/22/2020 10:25 AM EST ----- Please inform her that her thyroid level is abnormal and I have sent a prescription for an increased dose of levothyroxine to her pharmacy. Advise that Levothyroxine should be taken on an empty stomach first thing in the morning.

## 2020-09-05 DIAGNOSIS — M19031 Primary osteoarthritis, right wrist: Secondary | ICD-10-CM | POA: Diagnosis not present

## 2020-09-11 DIAGNOSIS — F250 Schizoaffective disorder, bipolar type: Secondary | ICD-10-CM | POA: Diagnosis not present

## 2020-09-11 DIAGNOSIS — F4001 Agoraphobia with panic disorder: Secondary | ICD-10-CM | POA: Diagnosis not present

## 2020-09-12 DIAGNOSIS — F900 Attention-deficit hyperactivity disorder, predominantly inattentive type: Secondary | ICD-10-CM | POA: Diagnosis not present

## 2020-09-12 DIAGNOSIS — F4001 Agoraphobia with panic disorder: Secondary | ICD-10-CM | POA: Diagnosis not present

## 2020-09-12 DIAGNOSIS — F423 Hoarding disorder: Secondary | ICD-10-CM | POA: Diagnosis not present

## 2020-09-12 DIAGNOSIS — F250 Schizoaffective disorder, bipolar type: Secondary | ICD-10-CM | POA: Diagnosis not present

## 2020-10-07 DIAGNOSIS — M19031 Primary osteoarthritis, right wrist: Secondary | ICD-10-CM | POA: Diagnosis not present

## 2020-10-07 DIAGNOSIS — M25531 Pain in right wrist: Secondary | ICD-10-CM | POA: Diagnosis not present

## 2020-10-09 DIAGNOSIS — F4001 Agoraphobia with panic disorder: Secondary | ICD-10-CM | POA: Diagnosis not present

## 2020-10-09 DIAGNOSIS — F250 Schizoaffective disorder, bipolar type: Secondary | ICD-10-CM | POA: Diagnosis not present

## 2020-10-12 ENCOUNTER — Other Ambulatory Visit: Payer: Self-pay

## 2020-10-12 ENCOUNTER — Ambulatory Visit (HOSPITAL_COMMUNITY)
Admission: EM | Admit: 2020-10-12 | Discharge: 2020-10-12 | Disposition: A | Discharge status: Home / Self Care | Payer: Medicare HMO | Attending: Physician Assistant | Admitting: Physician Assistant

## 2020-10-12 ENCOUNTER — Encounter (HOSPITAL_COMMUNITY): Payer: Self-pay | Admitting: *Deleted

## 2020-10-12 DIAGNOSIS — M25561 Pain in right knee: Secondary | ICD-10-CM | POA: Diagnosis not present

## 2020-10-12 DIAGNOSIS — G8929 Other chronic pain: Secondary | ICD-10-CM | POA: Diagnosis not present

## 2020-10-12 MED ORDER — KETOROLAC TROMETHAMINE 30 MG/ML IJ SOLN
30.0000 mg | Freq: Once | INTRAMUSCULAR | Status: AC
  Administered 2020-10-12: 30 mg via INTRAMUSCULAR

## 2020-10-12 MED ORDER — PREDNISONE 20 MG PO TABS: 40.0000 mg | ORAL_TABLET | Freq: Every day | ORAL | 0 refills | Status: DC

## 2020-10-12 MED ORDER — KETOROLAC TROMETHAMINE 30 MG/ML IJ SOLN
INTRAMUSCULAR | Status: AC
  Filled 2020-10-12: qty 1

## 2020-10-12 NOTE — ED Provider Notes (Signed)
[place]    CSN: [id number] Arrival date & time: 10/12/20  1013      History   Chief Complaint Chief Complaint  Patient presents with  . Knee Pain    HPI Caroline Williams is a 60 y.o. female.   Patient presents with several days of acute on chronic right knee pain and swelling. 2011 she had a knee replacement, then 3 years later a revision due to ongoing issues from initial surgery. Since then she experiences frequent flare ups.  Reports today's sx feel like a typical flare up.  Pt reports pain is typically improved with IM toradol and steroids.  Seen here in February with similar sx with relief with toradol and steroids.  Pt is diabetic, reports minimal increased in glucose when on steroids.  Has seen numerous specialists in the past for this, has had steroid injections, nerve blocks and many other procedures all without ongoing improvement in sxs. Denies fever, chills, diffuse edema, posterior knee or calf pain, numbness or tingling down leg to foot. She has an upcoming appointment with pain clinic in June.      Past Medical History:  Diagnosis Date  . Anxiety   . Arthritis   . Asthma   . Bipolar 1 disorder ([place])   . Breast discharge   . Breast lump   . Breast pain   . Chronic pain    on MS Contin and oxycodone  . Depression   . Diabetes mellitus    diet and exercise controlled  . Escherichia coli (E. coli) infection   . Fever   . GERD (gastroesophageal reflux disease)   . History of chest pain   . History of kidney stones   . History of knee replacement, total   . Hypertension   . Hypothyroidism   . N&V (nausea and vomiting)   . Peripheral neuropathy    Right foot no sensation  . Pneumonia    2015,2014  . Sleep apnea    does not use CPAP  . Thyroid disease   . Wears glasses     Patient Active Problem List   Diagnosis Date Noted  . Degeneration of lumbar intervertebral disc 08/07/2020  . Essential (primary) hypertension 05/07/2020  . Other  chronic pain 02/14/2020  . Elevated blood-pressure reading, without diagnosis of hypertension 02/13/2020  . Hyperlipidemia 09/17/2019  . COPD mixed type ([place]) 04/17/2019  . Tobacco user 04/17/2019  . Nocturnal hypoxemia 12/25/2018  . Muscle weakness 12/05/2018  . Change in bowel habits 03/14/2018  . Chest pain 01/26/2016  . Hallucination, drug-induced ([place]) 03/12/2015  . Hypothyroidism 03/12/2015  . Chronic low back pain 01/29/2015  . Lumbar spondylosis 01/29/2015  . Lap gastric bypass June 2016 11/25/2014  . Diabetes mellitus with neuropathy ([place]) 06/20/2013  . Chronic pain of right knee 06/20/2013  . Morbid obesity ([place]) 07/05/2012    Past Surgical History:  Procedure Laterality Date  . ABDOMINAL HYSTERECTOMY     partial  . APPENDECTOMY    . BIOPSY  03/14/2018   Procedure: BIOPSY;  Surgeon: Wilford Corner, MD;  Location: [facility];  Service: Endoscopy;;  . BREAST DUCTAL SYSTEM EXCISION Right 08/31/2017   Procedure: RIGHT BREAST CENTRAL DUCT EXCISION;  Surgeon: Erroll Luna, MD;  Location: [facility];  Service: General;  Laterality: Right;  . BREAST EXCISIONAL BIOPSY Right   . BREAST LUMPECTOMY     right  . CARDIAC CATHETERIZATION     no significant CAD, nl LV function by 11/08/06  cath  . CESAREAN SECTION     x4  . COLONOSCOPY WITH PROPOFOL N/A 03/14/2018   Procedure: COLONOSCOPY WITH PROPOFOL;  Surgeon: Wilford Corner, MD;  [facility];  Service: Endoscopy;  Laterality: N/A;  . GASTRIC ROUX-EN-Y N/A 11/25/2014   Procedure: LAPAROSCOPIC ROUX-EN-Y GASTRIC BYPASS WITH UPPER ENDOSCOPY;  Surgeon: Johnathan Hausen, MD;  Location: [facility];  Service: General;  Laterality: N/A;  . HERNIA REPAIR    . JOINT REPLACEMENT    . KNEE SURGERY     right  . MOUTH SURGERY    . [place]  . POLYPECTOMY  03/14/2018   Procedure: POLYPECTOMY;  Surgeon: Wilford Corner, MD;  [facility];  Service: Endoscopy;;  . TOTAL KNEE  REVISION  06/21/2012   Procedure: TOTAL KNEE REVISION;  Surgeon: Newt Minion, MD;  Location: [facility];  Service: Orthopedics;  Laterality: Right;  Revision Right Total Knee Arthroplasty    OB History   No obstetric history on file.      Home Medications    Prior to Admission medications   Medication Sig Start Date End Date Taking? Authorizing Provider  albuterol (VENTOLIN HFA) 108 (90 Base) MCG/ACT inhaler Inhale 2 puffs into the lungs every 6 (six) hours as needed for wheezing or shortness of breath. Shortness of breath 08/21/20  Yes Newlin, Charlane Ferretti, MD  ALPRAZolam (XANAX) 1 MG tablet Take 1 mg by mouth 4 (four) times daily.  07/02/14  Yes [provider]  amLODipine (NORVASC) 5 MG tablet TAKE 1 TABLET(5 MG) BY MOUTH DAILY 08/11/20  Yes Newlin, Enobong, MD  amphetamine-dextroamphetamine (ADDERALL XR) 30 MG 24 hr capsule Take 30 mg by mouth daily.  03/10/18  Yes [provider]  atorvastatin (LIPITOR) 20 MG tablet Take 1 tablet (20 mg total) by mouth daily. 08/21/20  Yes Charlott Rakes, MD  Bempedoic Acid-Ezetimibe (NEXLIZET) 180-10 MG TABS Take 1 tablet by mouth daily. 04/16/20  Yes Belva Crome, MD  Blood Glucose Monitoring Suppl (ACCU-CHEK GUIDE) w/Device KIT 1 kit by Does not apply route 3 (three) times daily. To check blood sugars 07/05/19  Yes Fulp, Cammie, MD  cariprazine (VRAYLAR) capsule 3 mg daily. 4.5 mg daily   Yes [provider]  cloNIDine (CATAPRES) 0.1 MG tablet Take 1 tablet (0.1 mg total) by mouth at bedtime. For hot flashes 08/21/20  Yes Charlott Rakes, MD  Cyanocobalamin (VITAMIN B 12 PO) Take 1 tablet by mouth daily.    Yes [provider]  diclofenac Sodium (VOLTAREN) 1 % GEL Apply topically. 07/30/20  Yes [provider]  doxepin (SINEQUAN) 75 MG capsule Take 75 mg by mouth at bedtime. 03/02/18  Yes [provider]  estradiol (ESTRACE VAGINAL) 0.1 MG/GM vaginal cream 1 g per vagina daily 08/21/20  Yes Newlin, Enobong, MD   ezetimibe (ZETIA) 10 MG tablet ezetimibe 10 mg tablet   Yes [provider]  glucose blood (ACCU-CHEK GUIDE) test strip Use as instructed 07/05/19  Yes Fulp, Cammie, MD  levothyroxine (SYNTHROID) 125 MCG tablet Take 1 tablet (125 mcg total) by mouth daily before breakfast. 08/22/20  Yes Newlin, Enobong, MD  linaclotide (LINZESS) 290 MCG CAPS capsule Take 290 mcg by mouth at bedtime.   Yes [provider]  morphine (MS CONTIN) 15 MG 12 hr tablet Take 1 tablet (15 mg total) by mouth every 12 (twelve) hours. 11/14/14  Yes Bayard Hugger, NP  Multiple Vitamin (MULTIVITAMIN) tablet Take 1 tablet by mouth every morning.    Yes  [provider]  nabumetone (RELAFEN) 750 MG tablet Take 750 mg by mouth 2 (two) times daily. 08/08/19  Yes [provider]  olmesartan-hydrochlorothiazide (BENICAR HCT) 20-12.5 MG tablet TAKE 1 TABLET BY MOUTH DAILY 12/29/19  Yes Fulp, Cammie, MD  omeprazole (PRILOSEC) 40 MG capsule TAKE 1 CAPSULE(40 MG) BY MOUTH DAILY 01/22/20  Yes Fulp, Cammie, MD  oxybutynin (DITROPAN) 5 MG tablet Take 1 tablet (5 mg total) by mouth 2 (two) times daily. 08/21/20  Yes Charlott Rakes, MD  Oxycodone HCl 10 MG TABS Take 1 tablet (10 mg total) by mouth every 6 (six) hours as needed (for pain). 11/14/14  Yes Bayard Hugger, NP  pregabalin (LYRICA) 100 MG capsule Take 1 capsule (100 mg total) by mouth 3 (three) times daily. 08/21/20  Yes Newlin, Charlane Ferretti, MD  umeclidinium-vilanterol (ANORO ELLIPTA) 62.5-25 MCG/INH AEPB Inhale 1 puff into the lungs daily. 04/17/19  Yes Young, Tarri Fuller D, MD  VOLTAREN 1 % GEL Apply 4 g topically 4 (four) times daily. 12/25/17  Yes Tereasa Coop, PA-C  vortioxetine HBr (TRINTELLIX) 10 MG TABS tablet 10 mg daily.   Yes [provider]  Alirocumab (PRALUENT) 150 MG/ML SOAJ Inject 1 pen into the skin every 14 (fourteen) days. 04/16/20   Belva Crome, MD  amphetamine-dextroamphetamine (ADDERALL) 20 MG tablet Take 10 mg by mouth every  evening. Pt. Is taking 22m in the evening. 10/13/17   [provider]  CHANTIX STARTING MONTH PAK 0.5 MG X 11 & 1 MG X 42 tablet  04/03/19   [provider]  ciclopirox (PENLAC) 8 % solution Apply topically at bedtime. Apply over nail and surrounding skin. Apply daily over previous coat. After seven (7) days, may remove with alcohol and continue cycle. Patient not taking: Reported on 08/21/2020 08/07/20   EBronson Ing DPM  ciprofloxacin (CIPRO) 500 MG tablet Take 1 tablet (500 mg total) by mouth 2 (two) times daily. 12/07/19   JLadell Pier MD  doxycycline (VIBRA-TABS) 100 MG tablet doxycycline hyclate 100 mg tablet  TAKE 1 TABLET BY MOUTH TWICE DAILY    [provider]  EPINEPHrine 0.3 mg/0.3 mL IJ SOAJ injection Inject 0.3 mg as directed as needed (severe allergic reaction).  01/11/14   [provider]  fluconazole (DIFLUCAN) 150 MG tablet fluconazole 150 mg tablet  TAKE 1 TABLET BY MOUTH EVERY DAY Patient not taking: Reported on 08/21/2020    [provider]  metoprolol tartrate (LOPRESSOR) 100 MG tablet Take one tablet by mouth 2 hours prior to CT 08/27/19   SBelva Crome MD  naloxone ([facility] nasal spray 4 mg/0.1 mL Narcan 4 mg/actuation nasal spray  USE 1 SPRAY INTRANASALLY IN 1 NOSTRIL. MAY REPEAT DOSE EVERY 2 TO 3 MINUTES AS NEEDED ALTERNATING NOSTRILS FOR OVERDOSE 05/22/20   [provider]  nitroGLYCERIN (NITROSTAT) 0.4 MG SL tablet DISSOLVE 1 TABLET UNDER THE TONGUE EVERY 5 MINUTES AS NEEDED 10/08/19   SBelva Crome MD  predniSONE (DELTASONE) 20 MG tablet Take 2 tablets (40 mg total) by mouth daily with breakfast. 10/12/20   Daysia Vandenboom, JJanett Billow PA-C  escitalopram (LEXAPRO) 20 MG tablet Take 20 mg by mouth daily.  12/14/18  [provider]  LATUDA 120 MG TABS Take 120 mg by mouth at bedtime.  03/04/18 12/14/18  [provider]  LISINOPRIL PO Take by mouth daily.    09/06/11  [provider]  metoCLOPramide  (REGLAN) 10 MG tablet Take 1 tablet (10 mg total) by mouth every  8 (eight) hours as needed for nausea. Patient not taking: Reported on 12/06/2019 06/30/18 05/12/20  Mesner, Corene Cornea, MD    Family History Family History  Problem Relation Age of Onset  . Cancer Father        colon  . Stroke Father   . Heart disease Father   . Diabetes Father   . Hypertension Father   . Depression Sister   . Hypertension Mother   . Breast cancer Neg Hx     Social History Social History   Tobacco Use  . Smoking status: Former Smoker    Packs/day: 0.25    Years: 43.00    Pack years: 10.75    Types: Cigarettes    Quit date: 2020    Years since quitting: 2.3  . Smokeless tobacco: Never Used  Vaping Use  . Vaping Use: Never used  Substance Use Topics  . Alcohol use: Yes    Comment: social  . Drug use: No     Allergies   Aspirin, Bee venom, Sulfa antibiotics, and Other   Review of Systems Review of Systems  Constitutional: Negative for chills and fever.  HENT: Negative for ear pain and sore throat.   Eyes: Negative for pain and visual disturbance.  Respiratory: Negative for cough and shortness of breath.   Cardiovascular: Negative for chest pain and palpitations.  Gastrointestinal: Negative for abdominal pain and vomiting.  Genitourinary: Negative for dysuria and hematuria.  Musculoskeletal: Positive for arthralgias (right knee pain). Negative for back pain.  Skin: Negative for color change and rash.  Neurological: Negative for seizures and syncope.  All other systems reviewed and are negative.    Physical Exam Triage Vital Signs ED Triage Vitals  Enc Vitals Group     BP 10/12/20 1102 (!) 188/103     Pulse Rate 10/12/20 1102 62     Resp --      Temp 10/12/20 1102 98.5 F (36.9 C)     Temp Source 10/12/20 1102 Oral     SpO2 10/12/20 1102 98 %     Weight --      Height --      Head Circumference --      Peak Flow --      Pain Score 10/12/20 1057 10     Pain Loc --      Pain  Edu? --      Excl. in [place]? --    No data found.  Updated Vital Signs BP (!) 188/103 (BP Location: Left Arm)   Pulse 62   Temp 98.5 F (36.9 C) (Oral)   SpO2 98%   Visual Acuity Right Eye Distance:   Left Eye Distance:   Bilateral Distance:    Right Eye Near:   Left Eye Near:    Bilateral Near:     Physical Exam Vitals and nursing note reviewed.  Constitutional:      General: She is not in acute distress.    Appearance: She is well-developed.  HENT:     Head: Normocephalic and atraumatic.  Eyes:     Conjunctiva/sclera: Conjunctivae normal.  Cardiovascular:     Rate and Rhythm: Normal rate and regular rhythm.     Heart sounds: No murmur heard.   Pulmonary:     Effort: Pulmonary effort is normal. No respiratory distress.     Breath sounds: Normal breath sounds.  Abdominal:     Palpations: Abdomen is soft.     Tenderness: There is no abdominal tenderness.  Musculoskeletal:  Cervical back: Neck supple.     Right knee: Swelling and bony tenderness present.  Skin:    General: Skin is warm and dry.  Neurological:     Mental Status: She is alert.      UC Treatments / Results  Labs (all labs ordered are listed, but only abnormal results are displayed) Labs Reviewed - No data to display  EKG   Radiology No results found.  Procedures Procedures (including critical care time)  Medications Ordered in UC Medications  ketorolac (TORADOL) 30 MG/ML injection 30 mg (30 mg Intramuscular Given 10/12/20 1126)    Initial Impression / Assessment and Plan / UC Course  I have reviewed the triage vital signs and the nursing notes.  Pertinent labs & imaging results that were available during my care of the patient were reviewed by me and considered in my medical decision making (see chart for details).     Toradol given in clinic today.  Prednisone prescribed.  She will keep her upcoming appointments with specialists.  Return precautions discussed.  Final Clinical  Impressions(s) / UC Diagnoses   Final diagnoses:  Chronic pain of right knee     Discharge Instructions     Take medications as prescribed Follow up with ortho as scheduled Watch blood glucose closely     ED Prescriptions    Medication Sig Dispense Auth. Provider   predniSONE (DELTASONE) 20 MG tablet Take 2 tablets (40 mg total) by mouth daily with breakfast. 6 tablet Konrad Felix, PA-C     PDMP not reviewed this encounter.   Konrad Felix, PA-C 10/12/20 1133

## 2020-10-12 NOTE — ED Triage Notes (Signed)
Pt reports RT knee pain that radiates up and down RT leg .

## 2020-10-12 NOTE — Discharge Instructions (Addendum)
Take medications as prescribed Follow up with ortho as scheduled Watch blood glucose closely

## 2020-10-13 ENCOUNTER — Other Ambulatory Visit: Payer: Self-pay

## 2020-10-13 ENCOUNTER — Encounter: Payer: Self-pay | Admitting: Skilled Nursing Facility1

## 2020-10-13 ENCOUNTER — Encounter: Payer: Medicare HMO | Attending: Family Medicine | Admitting: Skilled Nursing Facility1

## 2020-10-13 DIAGNOSIS — E119 Type 2 diabetes mellitus without complications: Secondary | ICD-10-CM | POA: Diagnosis not present

## 2020-10-13 DIAGNOSIS — E669 Obesity, unspecified: Secondary | ICD-10-CM | POA: Diagnosis not present

## 2020-10-13 NOTE — Progress Notes (Signed)
Bariatric Nutrition Follow-Up Visit Medical Nutrition Therapy   NUTRITION ASSESSMENT    Anthropometrics    Body Composition Scale 10/13/2020  Weight  lbs 231.6  Total Body Fat  % 44.5     Visceral Fat 16  Fat-Free Mass  % 55.4     Total Body Water  % 42.2     Muscle-Mass  lbs 30.3  BMI 38.2  Body Fat Displacement ---        Torso  lbs 63.8        Left Leg  lbs 12.7        Right Leg  lbs 12.7        Left Arm  lbs 6.3        Right Arm  lbs 6.3   Clinical  Medical hx: DM, kidney stone Medications:  Labs: A1C 5.8   Lifestyle & Dietary Hx  Pt states she has started keeping a food journal to lose 50 pounds. Pt states she is smoking.  Pt states she has not been taking her linzess as prescibred stating she did not feel she needed it.  Pt states she checks her blood sugars 3-4 times a day ranging from 130-140.  Pt states at an number of 80 se fees listless and bad stating when it is in the 80's she will eat fruit or peanut butter crackers. Pt states when she is having a menopausal fit (depression, sweating, hot flash, mood swings) she will not eat and then get blood sugars in the 80's. Pt states she gets a sugar craving daily and feel she loses control.  Pt states she has been in a bipolar funk seeing her therapist which is triggering her sweet cravings as well as feeling fatigued.  Pt state she wishes she could exercise and walk but she is in too much pain. Pt states she spends her whole day laying down watching television. Pt states he blocks her windows and keeps her room dark.   Pt states her daughter is taking her on a mystery trip this coming weekend which she is exicted about.   Estimated daily fluid intake: unknown  oz Estimated daily protein intake: 60+ g Supplements: B12, centrum silver,c calcium, potassium (occassionaly), biotin Current average weekly physical activity: ADl's  24-Hr Dietary Recall: one big meal a day First Meal: grits and cheese or half BLT with  v8 Snack: cookies Second Meal: grapes or lettuce + dressing Snack: doughnuts  Third Meal: ribs squash and bock choy Snack: chocolate  Beverages: water, soda, unsweet tea, juice, hot tea + stevia  Post-Op Goals/ Signs/ Symptoms Using straws: yes Drinking while eating: yes Chewing/swallowing difficulties: no Changes in vision: no Changes to mood/headaches: yes: mood changes stating it is due to menopause  Hair loss/changes to skin/nails: no Difficulty focusing/concentrating: yes: ADHD Sweating: menopause  Dizziness/lightheadedness: no Palpitations: no Carbonated/caffeinated beverages: no N/V/D/C/Gas: constipation: hard stools daily needing to use suppository once weekly Abdominal pain: no Dumping syndrome: no    NUTRITION DIAGNOSIS  Overweight/obesity ([place]-3.3) related to past poor dietary habits and physical inactivity as evidenced by completed bariatric surgery and following dietary guidelines for continued weight loss and healthy nutrition status.     NUTRITION INTERVENTION Nutrition counseling (C-1) and education (E-2) to facilitate bariatric surgery goals, including: . The importance of consuming adequate calories as well as certain nutrients daily due to the body's need for essential vitamins, minerals, and fats . The importance of daily physical activity and to reach a goal of at least 150  minutes of moderate to vigorous physical activity weekly (or as directed by their physician) due to benefits such as increased musculature and improved lab values . The importance of intuitive eating specifically learning hunger-satiety cues and understanding the importance of learning a new body: The importance of mindful eating to avoid grazing behaviors  Encouraged patient to honor their body's internal hunger and fullness cues.  Throughout the day, check in mentally and rate hunger. Stop eating when satisfied not full regardless of how much food is left on the plate.  Get more if still  hungry 20-30 minutes later.  The key is to honor satisfaction so throughout the meal, rate fullness factor and stop when comfortably satisfied not physically full. The key is to honor hunger and fullness without any feelings of guilt or shame.  Pay attention to what the internal cues are, rather than any external factors. This will enhance the confidence you have in listening to your own body and following those internal cues enabling you to increase how often you eat when you are hungry not out of appetite and stop when you are satisfied not full.  Encouraged pt to continue to eat balanced meals inclusive of non starchy vegetables 2 times a day 7 days a week Encouraged pt to choose lean protein sources: limiting beef, pork, sausage, hotdogs, and lunch meat Encourage pt to choose healthy fats such as plant based limiting animal fats . Encouraged pt to continue to drink a minium 64 fluid ounces with half being plain water to satisfy proper hydration  Goals: Take the appropriate multivitamin  Take calcium citrate  3 a day 2 hours from one another and 2 hours from your multi  Take your Linzess as prescribed  Quit smoking Do not change into your bedroom clothes until bed time  Do not lay down until it is bed time  Take your sleep aid for bedtime  Sit in your chair instead of laying down in your bed if it is not bed time   Handouts Provided Include   Multi and calcium   Learning Style & Readiness for Change Teaching method utilized: Visual & Auditory  Demonstrated degree of understanding via: Teach Back  Readiness Level: pre contemplative  Barriers to learning/adherence to lifestyle change:   RD's Notes for Next Visit . Assess adherence to pt chosen goals   MONITORING & EVALUATION Dietary intake, weekly physical activity, body weight  Next Steps Patient is to follow-up in 1 month

## 2020-10-23 DIAGNOSIS — M5416 Radiculopathy, lumbar region: Secondary | ICD-10-CM | POA: Diagnosis not present

## 2020-11-05 DIAGNOSIS — F250 Schizoaffective disorder, bipolar type: Secondary | ICD-10-CM | POA: Diagnosis not present

## 2020-11-08 ENCOUNTER — Other Ambulatory Visit: Payer: Self-pay | Admitting: Family Medicine

## 2020-11-08 DIAGNOSIS — I10 Essential (primary) hypertension: Secondary | ICD-10-CM

## 2020-11-09 NOTE — Telephone Encounter (Signed)
Requested Prescriptions  Pending Prescriptions Disp Refills  . amLODipine (NORVASC) 5 MG tablet [Pharmacy Med Name: AMLODIPINE BESYLATE 5MG  TABLETS] 90 tablet 0    Sig: TAKE 1 TABLET(5 MG) BY MOUTH DAILY     Cardiovascular:  Calcium Channel Blockers Failed - 11/08/2020  7:15 PM      Failed - Last BP in normal range    BP Readings from Last 1 Encounters:  10/12/20 (!) 188/103         Passed - Valid encounter within last 6 months    Recent Outpatient Visits          2 months ago Type 2 diabetes mellitus with chronic painful diabetic neuropathy ([place])   [place], Charlane Ferretti, MD   11 months ago Urinary incontinence, unspecified type   [place], [place], NP   1 year ago Neuropathic pain   [place], [place]   1 year ago Type 2 diabetes mellitus with chronic painful diabetic neuropathy [facility])   [facility] Antony Blackbird, MD

## 2020-11-11 ENCOUNTER — Other Ambulatory Visit: Payer: Self-pay | Admitting: Family Medicine

## 2020-11-11 DIAGNOSIS — I10 Essential (primary) hypertension: Secondary | ICD-10-CM

## 2020-11-11 NOTE — Telephone Encounter (Signed)
Requested medication (s) are due for refill today: no  Requested medication (s) are on the active medication list: yes  Last refill:  11/09/2020  Future visit scheduled:no  Notes to clinic:  ZERO refills remain on this prescription. Your patient is requesting advance approval of refills for this medication to [facility]   Requested Prescriptions  Pending Prescriptions Disp Refills   amLODipine (NORVASC) 5 MG tablet [Pharmacy Med Name: AMLODIPINE BESYLATE 5MG  TABLETS] 90 tablet 0    Sig: TAKE 1 TABLET(5 MG) BY MOUTH DAILY      Cardiovascular:  Calcium Channel Blockers Failed - 11/11/2020  8:08 AM      Failed - Last BP in normal range    BP Readings from Last 1 Encounters:  10/12/20 (!) 188/103          Passed - Valid encounter within last 6 months    Recent Outpatient Visits           2 months ago Type 2 diabetes mellitus with chronic painful diabetic neuropathy ([place])   [place], Charlane Ferretti, MD   11 months ago Urinary incontinence, unspecified type   [place], [place], NP   1 year ago Neuropathic pain   [place], [place]   1 year ago Type 2 diabetes mellitus with chronic painful diabetic neuropathy [facility])   [facility] Antony Blackbird, MD

## 2020-11-12 DIAGNOSIS — M1712 Unilateral primary osteoarthritis, left knee: Secondary | ICD-10-CM | POA: Diagnosis not present

## 2020-11-12 DIAGNOSIS — M5416 Radiculopathy, lumbar region: Secondary | ICD-10-CM | POA: Diagnosis not present

## 2020-11-12 DIAGNOSIS — G894 Chronic pain syndrome: Secondary | ICD-10-CM | POA: Diagnosis not present

## 2020-11-24 ENCOUNTER — Ambulatory Visit: Payer: Medicare HMO | Admitting: Family Medicine

## 2020-11-24 DIAGNOSIS — F900 Attention-deficit hyperactivity disorder, predominantly inattentive type: Secondary | ICD-10-CM | POA: Diagnosis not present

## 2020-11-24 DIAGNOSIS — F250 Schizoaffective disorder, bipolar type: Secondary | ICD-10-CM | POA: Diagnosis not present

## 2020-11-24 DIAGNOSIS — F4001 Agoraphobia with panic disorder: Secondary | ICD-10-CM | POA: Diagnosis not present

## 2020-11-25 ENCOUNTER — Telehealth: Payer: Self-pay | Admitting: Interventional Cardiology

## 2020-11-25 NOTE — Telephone Encounter (Signed)
See how pressure does with Amlodipine. Add HCTZ 12.5 mg daily if still > 140/90 mm HG  ----- Message -----  From: Theodoro Parma, RN  Sent: 11/25/2020   1:28 PM EDT  To: Belva Crome, MD, Loren Racer, RN

## 2020-11-25 NOTE — Telephone Encounter (Signed)
Pt c/o BP issue: STAT if pt c/o blurred vision, one-sided weakness or slurred speech  1. What are your last 5 BP readings? 180/106  2. Are you having any other symptoms (ex. Dizziness, headache, blurred vision, passed out)? Yes, experience all of them  3. What is your BP issue? Its too high

## 2020-11-25 NOTE — Telephone Encounter (Signed)
The patient reports she went to her psychiatrist yesterday and was told she wanted to prescribe a new medication for her depression ([place]), but she doesn't want to make any medication changes until the patient's BP is under control.  At the office, her BP was 186/106 and on second check 180/106. She reports her BP has been high "for a while" but she has experienced intermittent headaches, dizziness, and blurry vision since late last week. Her psychiatrist told her to contact Cardiology for BP management/medication changes.  Upon further discussion, she also stated she has had a higher frequency of CP for the last week or so. Her CP episodes are accompanied by shakiness and nausea. Her episodes always resolve with 1-2 NTG and rest.  Confirmed with the patient she is taking her medications as directed.  Confirmed she is currently asymptomatic.  Instructed the patient to increase her amlodipine to 10 mg daily, check BP 1-2 hours after and to call back Friday to speak with Dr. Thompson Caul nurse about BP readings. ER precautions reviewed.

## 2020-11-28 MED ORDER — AMLODIPINE BESYLATE 10 MG PO TABS: 10.0000 mg | ORAL_TABLET | Freq: Every day | ORAL | 3 refills | Status: DC

## 2020-11-28 MED ORDER — OLMESARTAN-AMLODIPINE-HCTZ 40-5-25 MG PO TABS: 1.0000 | ORAL_TABLET | Freq: Every day | ORAL | 3 refills | Status: DC

## 2020-11-28 NOTE — Addendum Note (Signed)
Addended by: Marcelle Overlie D on: 11/28/2020 04:10 PM   Modules accepted: Orders

## 2020-11-28 NOTE — Telephone Encounter (Signed)
Spoke with pt and BP still elevated.  Today was 156/100.  Reviewed recommendation to start HCTZ.  Pt mentioned that she is already on Olmesartan/HCTZ 20/12.5mg  QD.  Denies any swelling from increased dose of Amlodipine.  CP is much improved.  Still happening a little, but not nearly as bad as it was.  Advised I will send to Dr. Tamala Julian for review and advisement.

## 2020-11-28 NOTE — Telephone Encounter (Signed)
Please have patient double her olmesartan/HCTZ to 2 tablets daily. I will send Rx for the olmesartan 40/25mg  1 tablet daily but she can use up her 20/12.5mg  by doubling. Please have her follow up in the pharmD clinic. I had 1 apt on 6/30 @ 9:00. I have scheduled pt in that slot to hold it. Please see if pt is able to make that appointment.  Also sent in new rx for amlodipine

## 2020-11-28 NOTE — Telephone Encounter (Signed)
Spoke with pt and made her aware of recommendations.  Pt agreeable to plan and appt.  Pt appreciative for call.

## 2020-11-28 NOTE — Telephone Encounter (Signed)
Will route to PharmD team for assistance with BP medications.

## 2020-12-03 DIAGNOSIS — F4001 Agoraphobia with panic disorder: Secondary | ICD-10-CM | POA: Diagnosis not present

## 2020-12-03 DIAGNOSIS — F250 Schizoaffective disorder, bipolar type: Secondary | ICD-10-CM | POA: Diagnosis not present

## 2020-12-04 ENCOUNTER — Other Ambulatory Visit: Payer: Self-pay | Admitting: Family Medicine

## 2020-12-04 NOTE — Telephone Encounter (Signed)
Requested medication (s) are due for refill today: no  Requested medication (s) are on the active medication list: yes   Last refill: 11/08/2020  Future visit scheduled: no  Notes to clinic: Failed Protocol:One inhaler should last at least one month. If the patient is requesting refills earlier, contact the patient to check for uncontrolled symptoms   Requested Prescriptions  Pending Prescriptions Disp Refills   albuterol (VENTOLIN HFA) 108 (90 Base) MCG/ACT inhaler [Pharmacy Med Name: ALBUTEROL HFA INH (200 PUFFS) 6.7GM] 6.7 g     Sig: INHALE 2 PUFFS INTO THE LUNGS EVERY 6 HOURS AS NEEDED FOR WHEEZING OR SHORTNESS OF BREATH      Pulmonology:  Beta Agonists Failed - 12/04/2020 11:36 AM      Failed - One inhaler should last at least one month. If the patient is requesting refills earlier, contact the patient to check for uncontrolled symptoms.      Passed - Valid encounter within last 12 months    Recent Outpatient Visits           3 months ago Type 2 diabetes mellitus with chronic painful diabetic neuropathy ([place])   [place], Charlane Ferretti, MD   12 months ago Urinary incontinence, unspecified type   [place], [place], NP   1 year ago Neuropathic pain   [place], [place], [place]   1 year ago Type 2 diabetes mellitus with chronic painful diabetic neuropathy [facility])   [place], MD       Future Appointments             In 1 week [place], [facility]

## 2020-12-05 ENCOUNTER — Other Ambulatory Visit: Payer: Self-pay | Admitting: Family Medicine

## 2020-12-09 ENCOUNTER — Other Ambulatory Visit: Payer: Self-pay

## 2020-12-09 ENCOUNTER — Encounter: Payer: Medicare HMO | Attending: Family Medicine | Admitting: Skilled Nursing Facility1

## 2020-12-09 DIAGNOSIS — E119 Type 2 diabetes mellitus without complications: Secondary | ICD-10-CM | POA: Diagnosis not present

## 2020-12-09 NOTE — Progress Notes (Signed)
Bariatric Nutrition Follow-Up Visit Medical Nutrition Therapy   NUTRITION ASSESSMENT    Anthropometrics    Body Composition Scale 10/13/2020 12/09/2020  Weight  lbs 231.6 221.6  Total Body Fat  % 44.5 43.4     Visceral Fat 16 15  Fat-Free Mass  % 55.4 56.5     Total Body Water  % 42.2 42.7     Muscle-Mass  lbs 30.3 30.2  BMI 38.2 36.5  Body Fat Displacement ---         Torso  lbs 63.8 59.5        Left Leg  lbs 12.7 11.9        Right Leg  lbs 12.7 11.9        Left Arm  lbs 6.3 5.9        Right Arm  lbs 6.3 5.9   Clinical  Medical hx: DM, kidney stone Medications:  Labs: A1C 5.8   Lifestyle & Dietary Hx   Pt states her daughter is taking her on a mystery trip this coming weekend which she is exicted about.   Pt arrives having lost 10 pounds.  Pt states she had a wonderful time on her surprise trip.  Pt states she was able to the lose the weight from avoiding fast food (reported fast food meals in recall) stating she was able to do this after reading an article on the food products served.  Pt states she is considering [place] therapy for her severe depression.  Pt states she has gotten out of bed a couple times in the last month.  Pt states she has been working on quitting smoking.   Estimated daily fluid intake: unknown  oz Estimated daily protein intake: 60+ g Supplements: B12, centrum silver,calcium, potassium (occassionaly), biotin. Bariatric multivitamin (unknown which one) Current average weekly physical activity: ADl's  24-Hr Dietary Recall: one big meal a day First Meal: peanut butter crackers + fruit or fast food country ham biscuit Snack: cookies Second Meal: skipped Snack: doughnuts  Third Meal: ribs squash and bock choy or steak + salad Snack: chocolate  Beverages: water, soda, unsweet tea, juice, hot tea + stevia, hot tea  Post-Op Goals/ Signs/ Symptoms Using straws: yes Drinking while eating: yes Chewing/swallowing difficulties: no Changes in vision:  no Changes to mood/headaches: yes: mood changes stating it is due to menopause  Hair loss/changes to skin/nails: no Difficulty focusing/concentrating: yes: ADHD Sweating: menopause  Dizziness/lightheadedness: no Palpitations: no Carbonated/caffeinated beverages: no N/V/D/C/Gas: no longer having constipation  Abdominal pain: no Dumping syndrome: no    NUTRITION DIAGNOSIS  Overweight/obesity ([place]-3.3) related to past poor dietary habits and physical inactivity as evidenced by completed bariatric surgery and following dietary guidelines for continued weight loss and healthy nutrition status.     NUTRITION INTERVENTION Nutrition counseling (C-1) and education (E-2) to facilitate bariatric surgery goals, including: The importance of consuming adequate calories as well as certain nutrients daily due to the body's need for essential vitamins, minerals, and fats The importance of daily physical activity and to reach a goal of at least 150 minutes of moderate to vigorous physical activity weekly (or as directed by their physician) due to benefits such as increased musculature and improved lab values The importance of intuitive eating specifically learning hunger-satiety cues and understanding the importance of learning a new body: The importance of mindful eating to avoid grazing behaviors  Encouraged patient to honor their body's internal hunger and fullness cues.  Throughout the day, check in mentally and rate hunger.  Stop eating when satisfied not full regardless of how much food is left on the plate.  Get more if still hungry 20-30 minutes later.  The key is to honor satisfaction so throughout the meal, rate fullness factor and stop when comfortably satisfied not physically full. The key is to honor hunger and fullness without any feelings of guilt or shame.  Pay attention to what the internal cues are, rather than any external factors. This will enhance the confidence you have in listening to your  own body and following those internal cues enabling you to increase how often you eat when you are hungry not out of appetite and stop when you are satisfied not full.  Encouraged pt to continue to eat balanced meals inclusive of non starchy vegetables 2 times a day 7 days a week Encouraged pt to choose lean protein sources: limiting beef, pork, sausage, hotdogs, and lunch meat Encourage pt to choose healthy fats such as plant based limiting animal fats Encouraged pt to continue to drink a minium 64 fluid ounces with half being plain water to satisfy proper hydration  Goals: Pt states he wants to continue with these  Take the appropriate multivitamin  Take calcium citrate  3 a day 2 hours from one another and 2 hours from your multi  Take your Linzess as prescribed  Quit smoking Do not change into your bedroom clothes until bed time  Do not lay down until it is bed time  Take your sleep aid for bedtime  Sit in your chair instead of laying down in your bed if it is not bed time   Handouts Previously Provided Include  Multi and calcium   Learning Style & Readiness for Change Teaching method utilized: Visual & Auditory  Demonstrated degree of understanding via: Teach Back  Readiness Level: pre contemplative  Barriers to learning/adherence to lifestyle change:   RD's Notes for Next Visit Assess adherence to pt chosen goals   MONITORING & EVALUATION Dietary intake, weekly physical activity, body weight  Next Steps Patient is to follow-up in 1 month

## 2020-12-11 ENCOUNTER — Ambulatory Visit (INDEPENDENT_AMBULATORY_CARE_PROVIDER_SITE_OTHER): Payer: Medicare HMO | Admitting: Pharmacist

## 2020-12-11 ENCOUNTER — Other Ambulatory Visit: Payer: Self-pay

## 2020-12-11 ENCOUNTER — Other Ambulatory Visit: Payer: Self-pay | Admitting: *Deleted

## 2020-12-11 ENCOUNTER — Encounter: Payer: Self-pay | Admitting: *Deleted

## 2020-12-11 VITALS — BP 132/80 | HR 77

## 2020-12-11 DIAGNOSIS — R079 Chest pain, unspecified: Secondary | ICD-10-CM

## 2020-12-11 DIAGNOSIS — I10 Essential (primary) hypertension: Secondary | ICD-10-CM

## 2020-12-11 LAB — BASIC METABOLIC PANEL
BUN/Creatinine Ratio: 12 (ref 12–28)
BUN: 12 mg/dL (ref 8–27)
CO2: 26 mmol/L (ref 20–29)
Calcium: 9.3 mg/dL (ref 8.7–10.3)
Chloride: 99 mmol/L (ref 96–106)
Creatinine, Ser: 1.04 mg/dL — ABNORMAL HIGH (ref 0.57–1.00)
Glucose: 93 mg/dL (ref 65–99)
Potassium: 3.2 mmol/L — ABNORMAL LOW (ref 3.5–5.2)
Sodium: 139 mmol/L (ref 134–144)
eGFR: 62 mL/min/{1.73_m2} (ref >59)

## 2020-12-11 MED ORDER — ISOSORBIDE MONONITRATE ER 30 MG PO TB24: 30.0000 mg | ORAL_TABLET | Freq: Every day | ORAL | 3 refills | Status: DC

## 2020-12-11 NOTE — Progress Notes (Signed)
Patient ID: Caroline Williams                 DOB: 1960-10-28                      MRN: [id number]     HPI: MAKITA BLOW is a 60 y.o. female referred by Dr. Tamala Julian to HTN clinic. PMH is significant for DM II, hypertension, COPD, tobacco, HTN and hyperlipidemia. Patient seen previously by PharmD for lipids. She called clinic 6/14 stating her blood pressure was running high, 180/106. Her physiatrist wants to start her on Spravato but wanted patient to get her blood pressure under control first. She was experiencing headache, dizziness and blurry vision. Also having episodes of chest pain. Amlodipine was increased to 41m daily. On 6/17 blood pressures were improved, but still high. Olmesartan/HCTZ was increased from 20/12.568mdaily to 40/258maily.   Patient presents today for follow up. She reports that she is stilling having episodes of chest discomfort/tightness. She breaks out in a sweat, feels her heart racing, feels short of break, dizzy and weak. States this happens about 3 times per week. Will happen even at rest. She has to take 2-3 nitroglycerin for improvement. She states this is different than her panic attacks.   Her batteries died on her BP cuff so she has not checked in 3 days. She does not remember what her blood pressure was running prior to the batteries dying. Denies swelling. She is concerned about amlodipine based off a Tic Tok video she saw. She shows me the video. She is working with a nutEngineer, maintenance (IT)d working on weight loss. Admits she is not exercising.  Current HTN meds: olmesartan/HCTZ 40/41m65mily, amlodipine 10mg10mly Previously tried:  BP goal: <130/80  Family History: The patient's family history includes Cancer in her father; Depression in her sister; Diabetes in her father; Heart disease in her father; Hypertension in her father and mother; Stroke in her father.   Social History: 5 cigarettes per day  Diet: seeing a nutritionist- had gastric bypass about 3 years  ago  Exercise: none  Home BP readings: none available  Wt Readings from Last 3 Encounters:  12/09/20 221 lb 9.6 oz (100.5 kg)  10/13/20 231 lb 9.6 oz (105.1 kg)  08/21/20 228 lb (103.4 kg)   BP Readings from Last 3 Encounters:  10/12/20 (!) 188/103  08/21/20 140/84  08/05/20 (!) 145/89   Pulse Readings from Last 3 Encounters:  10/12/20 62  08/21/20 70  08/05/20 85    Renal function: CrCl cannot be calculated (Patient's most recent lab result is older than the maximum 21 days allowed.).  Past Medical History:  Diagnosis Date   Anxiety    Arthritis    Asthma    Bipolar 1 disorder (HCC) EcruBreast discharge    Breast lump    Breast pain    Chronic pain    on MS Contin and oxycodone   Depression    Diabetes mellitus    diet and exercise controlled   Escherichia coli (E. coli) infection    Fever    GERD (gastroesophageal reflux disease)    History of chest pain    History of kidney stones    History of knee replacement, total    Hypertension    Hypothyroidism    N&V (nausea and vomiting)    Peripheral neuropathy    Right foot no sensation   Pneumonia    2015,2014  Sleep apnea    does not use CPAP   Thyroid disease    Wears glasses     Current Outpatient Medications on File Prior to Visit  Medication Sig Dispense Refill   albuterol (VENTOLIN HFA) 108 (90 Base) MCG/ACT inhaler INHALE 2 PUFFS INTO THE LUNGS EVERY 6 HOURS AS NEEDED FOR WHEEZING OR SHORTNESS OF BREATH 6.7 g 0   Alirocumab (PRALUENT) 150 MG/ML SOAJ Inject 1 pen into the skin every 14 (fourteen) days. 6 mL 3   ALPRAZolam (XANAX) 1 MG tablet Take 1 mg by mouth 4 (four) times daily.   0   amLODipine (NORVASC) 10 MG tablet Take 1 tablet (10 mg total) by mouth daily. 90 tablet 3   amphetamine-dextroamphetamine (ADDERALL XR) 30 MG 24 hr capsule Take 30 mg by mouth daily.      amphetamine-dextroamphetamine (ADDERALL) 20 MG tablet Take 10 mg by mouth every evening. Pt. Is taking 78m in the evening.  0    atorvastatin (LIPITOR) 20 MG tablet Take 1 tablet (20 mg total) by mouth daily. 30 tablet 3   Bempedoic Acid-Ezetimibe (NEXLIZET) 180-10 MG TABS Take 1 tablet by mouth daily. 90 tablet 3   Blood Glucose Monitoring Suppl (ACCU-CHEK GUIDE) w/Device KIT 1 kit by Does not apply route 3 (three) times daily. To check blood sugars 1 kit 0   cariprazine (VRAYLAR) capsule 3 mg daily. 4.5 mg daily     CHANTIX STARTING MONTH PAK 0.5 MG X 11 & 1 MG X 42 tablet      ciclopirox (PENLAC) 8 % solution Apply topically at bedtime. Apply over nail and surrounding skin. Apply daily over previous coat. After seven (7) days, may remove with alcohol and continue cycle. (Patient not taking: Reported on 08/21/2020) 6.6 mL 0   ciprofloxacin (CIPRO) 500 MG tablet Take 1 tablet (500 mg total) by mouth 2 (two) times daily. 10 tablet 0   cloNIDine (CATAPRES) 0.1 MG tablet Take 1 tablet (0.1 mg total) by mouth at bedtime. For hot flashes 30 tablet 3   Cyanocobalamin (VITAMIN B 12 PO) Take 1 tablet by mouth daily.      diclofenac Sodium (VOLTAREN) 1 % GEL Apply topically.     doxepin (SINEQUAN) 75 MG capsule Take 75 mg by mouth at bedtime.  0   doxycycline (VIBRA-TABS) 100 MG tablet doxycycline hyclate 100 mg tablet  TAKE 1 TABLET BY MOUTH TWICE DAILY     EPINEPHrine 0.3 mg/0.3 mL IJ SOAJ injection Inject 0.3 mg as directed as needed (severe allergic reaction).      estradiol (ESTRACE VAGINAL) 0.1 MG/GM vaginal cream 1 g per vagina daily 42.5 g 1   ezetimibe (ZETIA) 10 MG tablet ezetimibe 10 mg tablet     fluconazole (DIFLUCAN) 150 MG tablet fluconazole 150 mg tablet  TAKE 1 TABLET BY MOUTH EVERY DAY (Patient not taking: Reported on 08/21/2020)     glucose blood (ACCU-CHEK GUIDE) test strip Use as instructed 100 each 12   levothyroxine (SYNTHROID) 125 MCG tablet Take 1 tablet (125 mcg total) by mouth daily before breakfast. 90 tablet 1   linaclotide (LINZESS) 290 MCG CAPS capsule Take 290 mcg by mouth at bedtime.      metoprolol tartrate (LOPRESSOR) 100 MG tablet Take one tablet by mouth 2 hours prior to CT 1 tablet 0   morphine (MS CONTIN) 15 MG 12 hr tablet Take 1 tablet (15 mg total) by mouth every 12 (twelve) hours. 60 tablet 0   Multiple Vitamin (MULTIVITAMIN) tablet Take  1 tablet by mouth every morning.      nabumetone (RELAFEN) 750 MG tablet Take 750 mg by mouth 2 (two) times daily.     naloxone (NARCAN) nasal spray 4 mg/0.1 mL Narcan 4 mg/actuation nasal spray  USE 1 SPRAY INTRANASALLY IN 1 NOSTRIL. MAY REPEAT DOSE EVERY 2 TO 3 MINUTES AS NEEDED ALTERNATING NOSTRILS FOR OVERDOSE     nitroGLYCERIN (NITROSTAT) 0.4 MG SL tablet DISSOLVE 1 TABLET UNDER THE TONGUE EVERY 5 MINUTES AS NEEDED 25 tablet 3   Olmesartan-amLODIPine-HCTZ 40-5-25 MG TABS Take 1 tablet by mouth daily. 90 tablet 3   omeprazole (PRILOSEC) 40 MG capsule TAKE 1 CAPSULE(40 MG) BY MOUTH DAILY 90 capsule 1   oxybutynin (DITROPAN) 5 MG tablet Take 1 tablet (5 mg total) by mouth 2 (two) times daily. 60 tablet 3   Oxycodone HCl 10 MG TABS Take 1 tablet (10 mg total) by mouth every 6 (six) hours as needed (for pain). 120 tablet 0   predniSONE (DELTASONE) 20 MG tablet Take 2 tablets (40 mg total) by mouth daily with breakfast. 6 tablet 0   pregabalin (LYRICA) 100 MG capsule Take 1 capsule (100 mg total) by mouth 3 (three) times daily. 90 capsule 1   umeclidinium-vilanterol (ANORO ELLIPTA) 62.5-25 MCG/INH AEPB Inhale 1 puff into the lungs daily. 1 each 0   VOLTAREN 1 % GEL Apply 4 g topically 4 (four) times daily. 100 g 11   vortioxetine HBr (TRINTELLIX) 10 MG TABS tablet 10 mg daily.     [DISCONTINUED] escitalopram (LEXAPRO) 20 MG tablet Take 20 mg by mouth daily.     [DISCONTINUED] LATUDA 120 MG TABS Take 120 mg by mouth at bedtime.   2   [DISCONTINUED] LISINOPRIL PO Take by mouth daily.       [DISCONTINUED] metoCLOPramide (REGLAN) 10 MG tablet Take 1 tablet (10 mg total) by mouth every 8 (eight) hours as needed for nausea. (Patient not taking:  Reported on 12/06/2019) 30 tablet 0   No current facility-administered medications on file prior to visit.    Allergies  Allergen Reactions   Aspirin Anaphylaxis and Other (See Comments)    Pt states she has had Toradol several times without any reactions   Bee Venom Anaphylaxis   Sulfa Antibiotics Anaphylaxis   Other Other (See Comments)    No blood products.  Patient did  Request that only albumin or albumin-containing products may be administered    There were no vitals taken for this visit.   Assessment/Plan:  1. Hypertension - Blood pressure is close to goal of <130/80 in clinic today. Will not make any changes today since no home readings available. I have asked her to get new batteries for her monitor and continue checking BP at home. Write readings down and bring log plus monitor to next appointment. Continue amlodipine 48m daily and olmesartan/HCTZ 40/214mdaily. Add isosorbide mononitrate 3022maily for angina. Checking BMP today since increasing dose of olmesartan/HCTZ a few weeks ago. Will discuss diet and exercise in more detail at next visit. She showed me the tic tok video. I advised that she should be careful about taking medical advise off of social media. I explained the inaccuracies in the video. Advised that is she ever hears something that concerns her, she should call us [place] ask. She was provided with some patient education on diet/exercise. Follow up in 3 weeks.  2. Chest discomfort - I spoke with Dr. SmiTamala Julianile patient was in the office about her episodes of  chest tightness/SOB/weakness. CAC 1 year ago showed a score of 49 with no obstructive disease. Patient has been scheduled for a lexiscan to rule out ischemia. Add Imdur 40m daily.   Thank you  MRamond Dial Pharm.D, BCPS, CPP [place] [address] [place] [id number] Phone: [phone] Fax: [phone]

## 2020-12-11 NOTE — Patient Instructions (Addendum)
Please continue taking olmesartan/HCTZ 40/25mg  daily, amlodipine 10mg  daily  Start taking isosorbide 30mg  daily.  Please check your blood pressure at home once a day. Bring your log and machine with you to your next appointment.  Hypertension "High blood pressure"  Hypertension is often called "The Silent Killer." It rarely causes symptoms until it is extremely  high or has done damage to other organs in the body. For this reason, you should have your  blood pressure checked regularly by your physician. We will check your blood pressure  every time you see a provider at one of our offices.   Your blood pressure reading consists of two numbers. Ideally, blood pressure should be  below 120/80. The first ("top") number is called the systolic pressure. It measures the  pressure in your arteries as your heart beats. The second ("bottom") number is called the diastolic pressure. It measures the pressure in your arteries as the heart relaxes between beats.  The benefits of getting your blood pressure under control are enormous. A 10-point  reduction in systolic blood pressure can reduce your risk of stroke by 27% and heart failure by 28%  Your blood pressure goal is <130/80  To check your pressure at home you will need to:  1. Sit up in a chair, with feet flat on the floor and back supported. Do not cross your ankles or legs. 2. Rest your left arm so that the cuff is about heart level. If the cuff goes on your upper arm,  then just relax the arm on the table, arm of the chair or your lap. If you have a wrist cuff, we  suggest relaxing your wrist against your chest (think of it as Pledging the Flag with the  wrong arm).  3. Place the cuff snugly around your arm, about 1 inch above the crook of your elbow. The  cords should be inside the groove of your elbow.  4. Sit quietly, with the cuff in place, for about 5 minutes. After that 5 minutes press the power  button to start a reading. 5.  Do not talk or move while the reading is taking place.  6. Record your readings on a sheet of paper. Although most cuffs have a memory, it is often  easier to see a pattern developing when the numbers are all in front of you.  7. You can repeat the reading after 1-3 minutes if it is recommended  Make sure your bladder is empty and you have not had caffeine or tobacco within the last 30 min  Always bring your blood pressure log with you to your appointments. If you have not brought your monitor in to be double checked for accuracy, please bring it to your next appointment.  You can find a list of validated (accurate) blood pressure cuffs at PopPath.it  Healthy Diet  SALT  What is the big deal with sodium? Why the need to limit our intake? What is the connection to  blood pressure? And what is the difference between salt and sodium? Sodium attracts water. Think about it. When you eat an overly salty snack, you tend to become  thirsty and need more water. If you have too much sodium in your bloodstream, your body will  then pull water into the bloodstream as well, trying to correct the imbalance. When you have  more volume in the bloodstream, your blood pressure goes up. Your heart must work harder to  pump the extra volume, and the increase  in pressure can wear out blood vessels faster. You may  also notice bloating and weight gain. Hypertension is one of the leading risk factors for heart  disease. By limiting sodium intake throughout life you are helping decrease your risk of heart  disease later on.   Salt is made up of two minerals. Sodium and chloride. A teaspoon of salt contains about 40%  sodium and 60% chloride. One teaspoon of salt has 2,300 mg of sodium. While the current  USDA guideline states you should consume no more than 2,300 mg per day, both they and the  American Heart Association recommend that you limit this to 1,500 mg to stay healthy. Sea salt  or [place] pink  salt may have a slightly different taste, but they still have almost the same  percent of sodium per teaspoon. So feel free to use them instead of table salt, but don't use  more.  A common myth is that if you don't add salt to your food, you are following a low sodium diet.  However, 75% of the sodium consumed in the American diet is from processed foods, NOT the  salt shaker. We all know that chips and crackers are high in sodium, but there are many other  foods that we may not think of when limiting our sodium. Below are the "salty six" foods that  the American Heart Association wants you to be aware of. 1. Cold cuts - even the healthy sliced [place] can have over 1,000 mg of sodium per slice.   Compare different brands to see which has less sodium if you eat these regularly 2. Pizza - depending on your toppings, a slice of pizza can have up to 760 mg of   sodium. Put more veggies on it or just have a slice with a side salad and still enjoy. 3. Soup - yes even that old home remedy of chicken soup is loaded with sodium. Look   for low sodium versions. Or add a bunch of frozen veggies when heating it, this will   give you less sodium per serving 4. Breads - they may not taste salty, but a single slice of bread can have up to 230 mg of   sodium. Toast for breakfast, a sandwich at lunch and a dinner roll can quickly add up   to over 900 mg in just one day.  5. Chicken - some fresh or frozen chicken is injected with a sodium solution before it   reaches the store. A 4 oz serving should have no more than 100 mg sodium. And   watch for breaded frozen chicken nuggets, strips and tenders. They may seem like a   quick and easy "healthy" meal, but they have high amounts of sodium as well. 6. Burritos/tacos - just 2 teaspoons of taco seasoning can have over 400 mg sodium.   Try making your own with equal parts of cumin, oregano, chili powder and garlic powder.   SUGAR  Sugar is a huge problem in  the modern day diet. Sugar is a HUGE contributor to heart disease, diabetes, high triglyceride levels, fatty liver diease and obesity. Sugar is hidden in almost all packaged foods/beverages. It adds no nutritional benefit to your body and can cause major harm. Added sugar is extra sugar that is added beyond what is naturally found. The American Heart Association recommends limiting added sugars to no more than 25g for women and 36 grams for men per day.  There are many  names for sugar maltose, sucrose (names ending in "ose"), high fructose corn syrup, molasses, cane sugar, corn sweetener, raw sugar, syrup, honey or fruit juice concentrate.   One of the best ways to limit your added sugars is to stop drinking sweetened beverages such as soda, sweet tea, fruit juice or fancy coffee's. There is 65g of added sugars in one 20oz bottle of Coke!! That is equal to 6 donuts.   Pay attention and read all nutrition facts labels. Below is an examples of a nutrition facts label. The #1 is showing you the total sugars where the # 2 is showing you the added sugars. This one severing has almost the max amount of added sugars per day!  Watch out for items that say "low fat" or "no added sugar" as these products are typically very high in sugar. The food industry uses these terms to fool you into thinking they are healthy.  For more information on the dangers of sugar watch WHY Sugar is as Bad as Alcohol (Fructose, The Liver Toxin) on YouTube.    EXERCISE  Exercise can help lower your blood pressure ~5 points systolic (top #) and 8 points diastolic (bottom #)  Exercise is good. We've all heard that. In an ideal world, we would all have time and resources to  get plenty of it. When you are active your heart pumps more efficiently and you will feel better.  Multiple studies show that even walking regularly has benefits that include living a longer life.  The American Heart Association recommends 90-150 minutes per  week of exercise (30 minutes  per day most days of the week). You can do this in any increment you wish. Nine or more  10-minute walks count. So does an hour-long exercise class. Break the time apart into what will  work in your life. Some of the best things you can do include walking briskly, jogging, cycling or  swimming laps. Not everyone is ready to "exercise." Sometimes we need to start with just getting active. Here  are some easy ways to be more active throughout the day:  Take the stairs instead of the elevator  Go for a 10-15 minute walk during your lunch break (find a friend to make it more enjoyable)  When shopping, park at the back of the parking lot  If you take public transportation, get off one stop early and walk the extra distance  Pace around while making phone calls (most of [place] are not attached to phone cords any longer!) Check with your doctor if you aren't sure what your limitations may be. Always remember to drink plenty of water when doing any type of exercise. Don't feel like a failure if you're not getting the 90-150 minutes per week. If you started by being  a couch potato, then just a 10-minute walk each day is a huge improvement. Start with little  victories and work your way up.   Healthy Eating Tips  When looking to improve your eating habits, whether to lose weight, lower blood pressure or just be healthier, it helps to know what a serving size is.   Grains 1 slice of bread,  bagel,  cup pasta or rice  Vegetables 1 cup fresh or raw vegetables,  cup cooked or canned Fruits 1 piece of medium sized fruit,  cup canned,   Meats/Proteins  cup dried       1 oz meat, 1 egg,  cup cooked beans, nuts or seeds  Dairy  Fats Individual yogurt container, 1 cup (8oz)    1 teaspoon margarine/butter or vegetable  milk or milk alternative, 1 slice of cheese          oil; 1 tablespoon mayonnaise or salad dressing                  Plan ahead: make a menu of  the meals for a week then create a grocery list to go with  that menu. Consider meals that easily stretch into a night of leftovers, such as stews or  casseroles. Or consider making two of your favorite meal and put one in the freezer or fridge for  another night.  When you get home from the grocery store wash and prepare your vegetables and fruits.  Then when you need them they are ready to go.  Tips for going to the grocery store:  [place] store or generic brands  Check the weekly ad from your store on-line or in their in-store flyer  Look at the unit price on the shelf tag to compare/contrast the costs of different items  Buy fruits/vegetables in season  Carrots, bananas and apples are low-cost, naturally healthy items  If meats or frozen vegetables are on sale, buy some extras and put in your freezer  Limit buying prepared or "ready to eat" items, even if they are pre-made salads or fruit snacks  Do not shop when you're hungry  Foods at eye level tend to be more expensive. Look on the high and low shelves for deals.  Consider shopping at the farmer's market for fresh foods in season.  Choose canned tuna or salmon instead of fresh  Avoid the cookie and chip aisles (these are expensive, high in calories and low in  nutritional value) Healthy food preparations:  If you can't get lean hamburger, be sure to drain the fat when cooking  Steam, saut (in olive oil), grill or bake foods  Experiment with different seasonings to avoid adding salt to your foods. Kosher salt, sea salt and [place] salt are all still salt and should be avoided          Aim to have one 12 hour fast each day. This means no eating after dinner until breakfast. For example, if you eat dinner around 6 PM then you would not eat anything until 6 AM the next day. This is a great way to help lower your insulin levels, lose weight and reduce your blood pressure.  Resources: American Heart Association -  InstantFinish.fi Go to the Healthy Living tab to get more information American Diabetes Association - www.diabetes.org You don't have to be diabetic - check out the Food and Fitness tab  DASH diet - https://wilson-eaton.com/ Health topics - or just search on their home page for DASH Quit for Life - www.cancer.org Follow the Stay Healthy tab to learn more about smoking cessatio

## 2020-12-12 ENCOUNTER — Telehealth: Payer: Self-pay | Admitting: Interventional Cardiology

## 2020-12-12 DIAGNOSIS — Z79899 Other long term (current) drug therapy: Secondary | ICD-10-CM

## 2020-12-12 MED ORDER — POTASSIUM CHLORIDE CRYS ER 20 MEQ PO TBCR: 20.0000 meq | EXTENDED_RELEASE_TABLET | Freq: Every day | ORAL | 3 refills | Status: DC

## 2020-12-12 NOTE — Telephone Encounter (Signed)
Patient was calling to receive her results

## 2020-12-12 NOTE — Telephone Encounter (Signed)
-----   Message from Belva Crome, MD sent at 12/11/2020  9:09 PM EDT ----- Let the patient know potassium is low. Start K-Lor 20 meq daily. Repeat BMET 1 week. A copy will be sent to Charlott Rakes, MD

## 2020-12-18 ENCOUNTER — Other Ambulatory Visit: Payer: Self-pay | Admitting: Interventional Cardiology

## 2020-12-19 ENCOUNTER — Other Ambulatory Visit: Payer: Self-pay

## 2020-12-19 ENCOUNTER — Other Ambulatory Visit: Payer: Medicare HMO | Admitting: *Deleted

## 2020-12-19 DIAGNOSIS — Z79899 Other long term (current) drug therapy: Secondary | ICD-10-CM

## 2020-12-19 LAB — BASIC METABOLIC PANEL
BUN/Creatinine Ratio: 8 — ABNORMAL LOW (ref 12–28)
BUN: 11 mg/dL (ref 8–27)
CO2: 21 mmol/L (ref 20–29)
Calcium: 9.7 mg/dL (ref 8.7–10.3)
Chloride: 100 mmol/L (ref 96–106)
Creatinine, Ser: 1.39 mg/dL — ABNORMAL HIGH (ref 0.57–1.00)
Glucose: 136 mg/dL — ABNORMAL HIGH (ref 65–99)
Potassium: 3.1 mmol/L — ABNORMAL LOW (ref 3.5–5.2)
Sodium: 139 mmol/L (ref 134–144)
eGFR: 43 mL/min/{1.73_m2} — ABNORMAL LOW (ref >59)

## 2020-12-22 ENCOUNTER — Telehealth: Payer: Self-pay | Admitting: *Deleted

## 2020-12-22 DIAGNOSIS — E876 Hypokalemia: Secondary | ICD-10-CM

## 2020-12-22 NOTE — Telephone Encounter (Signed)
Spoke with pt and went over results and recommendations.  Pt agreeable to plan.  She will have labs drawn Tuesday when she is here to see the Pharmacist.

## 2020-12-22 NOTE — Telephone Encounter (Signed)
-----   Message from Belva Crome, MD sent at 12/20/2020 11:27 AM EDT ----- Regarding: Low Potassium Please have her take Potassium 20 meq bid for 3 days, then back to 20 meq daily. If Potassium continues to run low, may need to decrease the dose of Hctz in her regimen. BMET repeat in 10 days. ----- Message ----- From: Interface, [facility] Lab Results In Sent: 12/19/2020   5:37 PM EDT To: Belva Crome, MD

## 2020-12-24 ENCOUNTER — Telehealth (HOSPITAL_COMMUNITY): Payer: Self-pay | Admitting: *Deleted

## 2020-12-24 NOTE — Telephone Encounter (Signed)
Patient given detailed instructions per Myocardial Perfusion Study Information Sheet for the test on 12/31/20 at 1000. Patient notified to arrive 15 minutes early and that it is imperative to arrive on time for appointment to keep from having the test rescheduled.  If you need to cancel or reschedule your appointment, please call the office within 24 hours of your appointment. . Patient verbalized understanding.Latrell Potempa, Ranae Palms No mychart

## 2020-12-29 ENCOUNTER — Telehealth: Payer: Self-pay | Admitting: Family Medicine

## 2020-12-29 DIAGNOSIS — E039 Hypothyroidism, unspecified: Secondary | ICD-10-CM

## 2020-12-29 MED ORDER — LEVOTHYROXINE SODIUM 125 MCG PO TABS: 125.0000 ug | ORAL_TABLET | Freq: Every day | ORAL | 1 refills | Status: DC

## 2020-12-29 NOTE — Telephone Encounter (Signed)
Medication Refill - Medication: Levothyroxine   Has the patient contacted their pharmacy? Yes.   Pt called stating that the pharmacy has tried to reach office with no response. She states that she is completely out of this medication. Please advise.  (Agent: If no, request that the patient contact the pharmacy for the refill.) (Agent: If yes, when and what did the pharmacy advise?)  Preferred Pharmacy (with phone number or street name):  Agent: Please be advi [facility] - [place], [place]  [place] [address]  Phone: [phone] Fax: [phone]  Hours: Not open 24 hours  sed that RX refills may take up to 3 business days. We ask that you follow-up with your pharmacy.

## 2020-12-29 NOTE — Telephone Encounter (Signed)
No future visit at this time  

## 2020-12-30 ENCOUNTER — Other Ambulatory Visit: Payer: Self-pay

## 2020-12-30 ENCOUNTER — Telehealth: Payer: Self-pay | Admitting: Pharmacist

## 2020-12-30 ENCOUNTER — Other Ambulatory Visit: Payer: Medicare HMO | Admitting: *Deleted

## 2020-12-30 ENCOUNTER — Ambulatory Visit (INDEPENDENT_AMBULATORY_CARE_PROVIDER_SITE_OTHER): Payer: Medicare HMO | Admitting: Pharmacist

## 2020-12-30 VITALS — BP 130/82

## 2020-12-30 DIAGNOSIS — E876 Hypokalemia: Secondary | ICD-10-CM

## 2020-12-30 DIAGNOSIS — I10 Essential (primary) hypertension: Secondary | ICD-10-CM

## 2020-12-30 LAB — BASIC METABOLIC PANEL
BUN/Creatinine Ratio: 10 — ABNORMAL LOW (ref 12–28)
BUN: 12 mg/dL (ref 8–27)
CO2: 23 mmol/L (ref 20–29)
Calcium: 9 mg/dL (ref 8.7–10.3)
Chloride: 101 mmol/L (ref 96–106)
Creatinine, Ser: 1.19 mg/dL — ABNORMAL HIGH (ref 0.57–1.00)
Glucose: 102 mg/dL — ABNORMAL HIGH (ref 65–99)
Potassium: 3.9 mmol/L (ref 3.5–5.2)
Sodium: 137 mmol/L (ref 134–144)
eGFR: 52 mL/min/{1.73_m2} — ABNORMAL LOW (ref >59)

## 2020-12-30 MED ORDER — SPIRONOLACTONE 25 MG PO TABS: 25.0000 mg | ORAL_TABLET | Freq: Every day | ORAL | 3 refills | Status: DC

## 2020-12-30 NOTE — Progress Notes (Signed)
Patient ID: Caroline Williams                 DOB: 1961-06-11                      MRN: [id number]     HPI: Caroline Williams is a 60 y.o. female referred by Dr. Tamala Julian to HTN clinic. PMH is significant for DM II, hypertension, COPD, tobacco, HTN and hyperlipidemia. Patient seen previously by PharmD for lipids. She called clinic 6/14 stating her blood pressure was running high, 180/106. Her physiatrist wants to start her on Spravato but wanted patient to get her blood pressure under control first. She was experiencing headache, dizziness and blurry vision. Also having episodes of chest pain. Amlodipine was increased to 78m daily. On 6/17 blood pressures were improved, but still high. Olmesartan/HCTZ was increased from 20/12.543mdaily to 40/2558maily.   At last visit with PharmD on 6/30 patients blood pressure was 132/80. No home readings available. Patient was started on imdur for angina and was set up to have lexGorman Dr. SmiTamala Julian was low after increasing HCTZ to 1m18mhe was started on KCL 20MEQ daily. Follow up K was 3.1. She was instructed by Dr. SmitTamala Juliantake 20MEQ BID x 3 days then back to 20 MEQ daily. Will need repeat BMP today.  Patient presents today for follow up. She reports her chest pain is gone after starting isosorbide. She denies dizziness, lightheadedness, headache, blurred vision, SOB or swelling. Lost 10 lb in a month working with nutritionist. Hasn't been exercising though. She has both a wrist and upper arm cuff at home but primarily uses wrist cuff. States her BP runs in the 140's/78-80 range at home. She does not bring in any actual readings.  Current HTN meds: olmesartan/HCTZ 40/1mg37mly, amlodipine 10mg 40my, isosorbide mononitrate 30mg d63m Previously tried:  BP goal: <130/80  Family History: The patient's family history includes Cancer in her father; Depression in her sister; Diabetes in her father; Heart disease in her father; Hypertension in her father and mother;  Stroke in her father.   Social History: 5 cigarettes per day  Diet: seeing a nutritionist- had gastric bypass about 3 years ago  Exercise: none  Home BP readings: 140's/78-80  Wt Readings from Last 3 Encounters:  12/09/20 221 lb 9.6 oz (100.5 kg)  10/13/20 231 lb 9.6 oz (105.1 kg)  08/21/20 228 lb (103.4 kg)   BP Readings from Last 3 Encounters:  12/11/20 132/80  10/12/20 (!) 188/103  08/21/20 140/84   Pulse Readings from Last 3 Encounters:  12/11/20 77  10/12/20 62  08/21/20 70    Renal function: CrCl cannot be calculated (Unknown ideal weight.).  Past Medical History:  Diagnosis Date   Anxiety    Arthritis    Asthma    Bipolar 1 disorder (HCC)   [place] discharge    Breast lump    Breast pain    Chronic pain    on MS Contin and oxycodone   Depression    Diabetes mellitus    diet and exercise controlled   Escherichia coli (E. coli) infection    Fever    GERD (gastroesophageal reflux disease)    History of chest pain    History of kidney stones    History of knee replacement, total    Hypertension    Hypothyroidism    N&V (nausea and vomiting)    Peripheral neuropathy    Right foot  no sensation   Pneumonia    2015,2014   Sleep apnea    does not use CPAP   Thyroid disease    Wears glasses     Current Outpatient Medications on File Prior to Visit  Medication Sig Dispense Refill   albuterol (VENTOLIN HFA) 108 (90 Base) MCG/ACT inhaler INHALE 2 PUFFS INTO THE LUNGS EVERY 6 HOURS AS NEEDED FOR WHEEZING OR SHORTNESS OF BREATH 6.7 g 0   Alirocumab (PRALUENT) 150 MG/ML SOAJ Inject 1 pen into the skin every 14 (fourteen) days. 6 mL 3   ALPRAZolam (XANAX) 1 MG tablet Take 1 mg by mouth 4 (four) times daily.   0   amLODipine (NORVASC) 10 MG tablet Take 1 tablet (10 mg total) by mouth daily. 90 tablet 3   amphetamine-dextroamphetamine (ADDERALL XR) 30 MG 24 hr capsule Take 30 mg by mouth daily.      amphetamine-dextroamphetamine (ADDERALL) 20 MG tablet Take 20  mg by mouth every evening.  0   atorvastatin (LIPITOR) 20 MG tablet Take 1 tablet (20 mg total) by mouth daily. 30 tablet 3   Bempedoic Acid-Ezetimibe (NEXLIZET) 180-10 MG TABS Take 1 tablet by mouth daily. 90 tablet 3   Blood Glucose Monitoring Suppl (ACCU-CHEK GUIDE) w/Device KIT 1 kit by Does not apply route 3 (three) times daily. To check blood sugars 1 kit 0   cariprazine (VRAYLAR) capsule 4.5 mg daily. 4.5 mg daily     Cyanocobalamin (VITAMIN B 12 PO) Take 1 tablet by mouth daily.      diclofenac Sodium (VOLTAREN) 1 % GEL Apply topically.     doxepin (SINEQUAN) 75 MG capsule Take 75 mg by mouth at bedtime.  0   estradiol (ESTRACE VAGINAL) 0.1 MG/GM vaginal cream 1 g per vagina daily 42.5 g 1   glucose blood (ACCU-CHEK GUIDE) test strip Use as instructed 100 each 12   isosorbide mononitrate (IMDUR) 30 MG 24 hr tablet Take 1 tablet (30 mg total) by mouth daily. 90 tablet 3   levothyroxine (SYNTHROID) 125 MCG tablet Take 1 tablet (125 mcg total) by mouth daily before breakfast. 90 tablet 1   linaclotide (LINZESS) 290 MCG CAPS capsule Take 290 mcg by mouth at bedtime.     morphine (MS CONTIN) 15 MG 12 hr tablet Take 1 tablet (15 mg total) by mouth every 12 (twelve) hours. 60 tablet 0   Multiple Vitamin (MULTIVITAMIN) tablet Take 1 tablet by mouth every morning.      nabumetone (RELAFEN) 750 MG tablet Take 750 mg by mouth 2 (two) times daily.     naloxone (NARCAN) nasal spray 4 mg/0.1 mL Narcan 4 mg/actuation nasal spray  USE 1 SPRAY INTRANASALLY IN 1 NOSTRIL. MAY REPEAT DOSE EVERY 2 TO 3 MINUTES AS NEEDED ALTERNATING NOSTRILS FOR OVERDOSE     nitroGLYCERIN (NITROSTAT) 0.4 MG SL tablet DISSOLVE 1 TABLET UNDER THE TONGUE EVERY 5 MINUTES AS NEEDED 25 tablet 3   Olmesartan-amLODIPine-HCTZ 40-5-25 MG TABS Take 1 tablet by mouth daily. 90 tablet 3   omeprazole (PRILOSEC) 40 MG capsule TAKE 1 CAPSULE(40 MG) BY MOUTH DAILY 90 capsule 1   oxybutynin (DITROPAN) 5 MG tablet Take 1 tablet (5 mg total) by  mouth 2 (two) times daily. (Patient not taking: Reported on 12/11/2020) 60 tablet 3   Oxycodone HCl 10 MG TABS Take 1 tablet (10 mg total) by mouth every 6 (six) hours as needed (for pain). 120 tablet 0   potassium chloride SA (KLOR-CON) 20 MEQ tablet Take 1 tablet (  20 mEq total) by mouth daily. 90 tablet 3   pregabalin (LYRICA) 100 MG capsule Take 1 capsule (100 mg total) by mouth 3 (three) times daily. (Patient not taking: Reported on 12/11/2020) 90 capsule 1   umeclidinium-vilanterol (ANORO ELLIPTA) 62.5-25 MCG/INH AEPB Inhale 1 puff into the lungs daily. (Patient not taking: Reported on 12/11/2020) 1 each 0   vortioxetine HBr (TRINTELLIX) 10 MG TABS tablet 10 mg daily.     [DISCONTINUED] escitalopram (LEXAPRO) 20 MG tablet Take 20 mg by mouth daily.     [DISCONTINUED] LATUDA 120 MG TABS Take 120 mg by mouth at bedtime.   2   [DISCONTINUED] LISINOPRIL PO Take by mouth daily.       [DISCONTINUED] metoCLOPramide (REGLAN) 10 MG tablet Take 1 tablet (10 mg total) by mouth every 8 (eight) hours as needed for nausea. (Patient not taking: Reported on 12/06/2019) 30 tablet 0   No current facility-administered medications on file prior to visit.    Allergies  Allergen Reactions   Aspirin Anaphylaxis and Other (See Comments)    Pt states she has had Toradol several times without any reactions   Bee Venom Anaphylaxis   Sulfa Antibiotics Anaphylaxis   Other Other (See Comments)    No blood products.  Patient did  Request that only albumin or albumin-containing products may be administered    There were no vitals taken for this visit.   Assessment/Plan:  1. Hypertension - Blood pressure is close to goal of <130/80 in clinic today. She reports home readings in the 017'P systolic, 10-25 diastolic with a wrist cuff that has not been calibrated.  BMP from today is pending. If K is low will need to consider decreasing HCTZ. Best option for additional BP control is probably spironolactone. Will plan to  start spironolactone 12.18m daily if BMP looks ok. Depending on K level and what is done with HCTZ may or may not d/c KCL supplements. Will finalize plan once BMP results. I have asked the patient again to please bring in her BP cuff and log to her next apt. Follow up in 2 weeks.   Thank you  MRamond Dial Pharm.D, BCPS, CPP [place] [address] [place] [address] Phone: [phone] Fax: [phone]

## 2020-12-30 NOTE — Patient Instructions (Addendum)
Continue taking olmesartan/HCTZ 40/25mg  daily, amlodipine 10mg  daily and isosorbide mononitrate 30mg  daily  Someone from the office will call you tomorrow with your lab results and any medication changes  Continue checking blood pressure at home. Please bring your blood pressure log and machine with you to your next appointment.  Call me with any questions [phone]

## 2020-12-30 NOTE — Telephone Encounter (Signed)
K is 3.9. Scr stable. Will start spironolactone 12.5mg  daily. Stop KCL tablets for now. Called and LVM for pt to call back

## 2020-12-31 ENCOUNTER — Ambulatory Visit (HOSPITAL_COMMUNITY): Payer: Medicare HMO | Attending: Cardiology

## 2020-12-31 DIAGNOSIS — R079 Chest pain, unspecified: Secondary | ICD-10-CM | POA: Diagnosis not present

## 2020-12-31 DIAGNOSIS — F332 Major depressive disorder, recurrent severe without psychotic features: Secondary | ICD-10-CM | POA: Diagnosis not present

## 2020-12-31 LAB — MYOCARDIAL PERFUSION IMAGING
LV dias vol: 74 mL (ref 46–106)
LV sys vol: 40 mL
Peak HR: 83 {beats}/min
Rest HR: 72 {beats}/min
SDS: 0
SRS: 0
SSS: 0
TID: 0.99

## 2020-12-31 MED ORDER — TECHNETIUM TC 99M TETROFOSMIN IV KIT
10.3000 | PACK | Freq: Once | INTRAVENOUS | Status: AC | PRN
  Administered 2020-12-31: 10.3 via INTRAVENOUS
  Filled 2020-12-31: qty 11

## 2020-12-31 MED ORDER — REGADENOSON 0.4 MG/5ML IV SOLN
0.4000 mg | Freq: Once | INTRAVENOUS | Status: AC
  Administered 2020-12-31: 0.4 mg via INTRAVENOUS

## 2020-12-31 MED ORDER — TECHNETIUM TC 99M TETROFOSMIN IV KIT
30.4000 | PACK | Freq: Once | INTRAVENOUS | Status: AC | PRN
  Administered 2020-12-31: 30.4 via INTRAVENOUS
  Filled 2020-12-31: qty 31

## 2020-12-31 NOTE — Telephone Encounter (Signed)
Called pt, left message.

## 2021-01-01 MED ORDER — SPIRONOLACTONE 25 MG PO TABS: 12.5000 mg | ORAL_TABLET | Freq: Every day | ORAL | 3 refills | Status: DC

## 2021-01-01 NOTE — Telephone Encounter (Signed)
Spoke with patient. She is in agreement with the plan below. Follow up on 8/3- check BMP

## 2021-01-08 DIAGNOSIS — F332 Major depressive disorder, recurrent severe without psychotic features: Secondary | ICD-10-CM | POA: Diagnosis not present

## 2021-01-08 NOTE — Telephone Encounter (Signed)
Lauren from [facility] called on behalf of the pt to see about getting medication to sent to them. Please advise   [facility] - [place], [place] Phone:  [phone]  Fax:  [phone]

## 2021-01-09 DIAGNOSIS — F332 Major depressive disorder, recurrent severe without psychotic features: Secondary | ICD-10-CM | POA: Diagnosis not present

## 2021-01-09 MED ORDER — LEVOTHYROXINE SODIUM 125 MCG PO TABS
125.0000 ug | ORAL_TABLET | Freq: Every day | ORAL | 0 refills | Status: DC
Start: 2021-01-09 — End: 2021-04-09

## 2021-01-09 NOTE — Addendum Note (Signed)
Addended by: Daisy Blossom, Annie Main L on: 01/09/2021 09:44 AM   Modules accepted: Orders

## 2021-01-09 NOTE — Telephone Encounter (Signed)
Rx sent to [facility].  

## 2021-01-12 ENCOUNTER — Encounter: Payer: Medicare HMO | Admitting: Skilled Nursing Facility1

## 2021-01-12 ENCOUNTER — Other Ambulatory Visit: Payer: Self-pay

## 2021-01-12 DIAGNOSIS — F332 Major depressive disorder, recurrent severe without psychotic features: Secondary | ICD-10-CM | POA: Diagnosis not present

## 2021-01-13 DIAGNOSIS — F332 Major depressive disorder, recurrent severe without psychotic features: Secondary | ICD-10-CM | POA: Diagnosis not present

## 2021-01-14 ENCOUNTER — Ambulatory Visit: Payer: Medicare HMO

## 2021-01-14 DIAGNOSIS — F332 Major depressive disorder, recurrent severe without psychotic features: Secondary | ICD-10-CM | POA: Diagnosis not present

## 2021-01-14 NOTE — Progress Notes (Deleted)
Patient ID: NEEYA PRIGMORE                 DOB: [birth year]                      MRN: [id number]     HPI: Caroline Williams is a 60 y.o. female referred by Dr. Tamala Julian to HTN clinic. PMH is significant for DM II, hypertension, COPD, tobacco, HTN and hyperlipidemia. Patient seen previously by PharmD for lipids. She called clinic 6/14 stating her blood pressure was running high, 180/106. Her physiatrist wants to start her on Spravato but wanted patient to get her blood pressure under control first. She was experiencing headache, dizziness and blurry vision. Also having episodes of chest pain. Amlodipine was increased to 21m daily. On 6/17 blood pressures were improved, but still high. Olmesartan/HCTZ was increased from 20/12.526mdaily to 40/2578maily.   At visit with PharmD on 6/30 patients blood pressure was 132/80. No home readings available. Patient was started on imdur for angina and was set up to have [place] [facility]. SmiTamala Julian was low after increasing HCTZ to 57m35mhe was started on KCL 20MEQ daily. Follow up K was 3.1. She was instructed by Dr. SmitTamala Juliantake 20MEQ BID x 3 days then back to 20 MEQ daily.  At last visit with pharmD on 7/19, spironolactone 12.5mg 70mly was started and KCL tablets were d/c'd.  Dizziness, lightheadedness, headache, blurred vision, SOB, swelling Home bp? Needs BMP Did she bring BP cuff?   Patient presents today for follow up. She reports her chest pain is gone after starting isosorbide. She denies dizziness, lightheadedness, headache, blurred vision, SOB or swelling. Lost 10 lb in a month working with nutritionist. Hasn't been exercising though. She has both a wrist and upper arm cuff at home but primarily uses wrist cuff. States her BP runs in the 140's/78-80 range at home. She does not bring in any actual readings.  Current HTN meds: olmesartan/HCTZ 40/57mg 26my, amlodipine 10mg d82m, isosorbide mononitrate 30mg da80m spironolactone 12.5mg dail23mreviously tried:   BP goal: <130/80  Family History: The patient's family history includes Cancer in her father; Depression in her sister; Diabetes in her father; Heart disease in her father; Hypertension in her father and mother; Stroke in her father.   Social History: 5 cigarettes per day  Diet: seeing a nutritionist- had gastric bypass about 3 years ago  Exercise: none  Home BP readings:   Wt Readings from Last 3 Encounters:  12/31/20 221 lb (100.2 kg)  12/09/20 221 lb 9.6 oz (100.5 kg)  10/13/20 231 lb 9.6 oz (105.1 kg)   BP Readings from Last 3 Encounters:  12/30/20 130/82  12/11/20 132/80  10/12/20 (!) 188/103   Pulse Readings from Last 3 Encounters:  12/11/20 77  10/12/20 62  08/21/20 70    Renal function: Estimated Creatinine Clearance: 59 mL/min (A) (by C-G formula based on SCr of 1.19 mg/dL (H)).  Past Medical History:  Diagnosis Date   Anxiety    Arthritis    Asthma    Bipolar 1 disorder (HCC)    [place] discharge    Breast lump    Breast pain    Chronic pain    on MS Contin and oxycodone   Depression    Diabetes mellitus    diet and exercise controlled   Escherichia coli (E. coli) infection    Fever    GERD (gastroesophageal reflux disease)    History of  chest pain    History of kidney stones    History of knee replacement, total    Hypertension    Hypothyroidism    N&V (nausea and vomiting)    Peripheral neuropathy    Right foot no sensation   Pneumonia    2015,2014   Sleep apnea    does not use CPAP   Thyroid disease    Wears glasses     Current Outpatient Medications on File Prior to Visit  Medication Sig Dispense Refill   albuterol (VENTOLIN HFA) 108 (90 Base) MCG/ACT inhaler INHALE 2 PUFFS INTO THE LUNGS EVERY 6 HOURS AS NEEDED FOR WHEEZING OR SHORTNESS OF BREATH 6.7 g 0   Alirocumab (PRALUENT) 150 MG/ML SOAJ Inject 1 pen into the skin every 14 (fourteen) days. 6 mL 3   ALPRAZolam (XANAX) 1 MG tablet Take 1 mg by mouth 4 (four) times daily.   0    amLODipine (NORVASC) 10 MG tablet Take 1 tablet (10 mg total) by mouth daily. 90 tablet 3   amphetamine-dextroamphetamine (ADDERALL XR) 30 MG 24 hr capsule Take 30 mg by mouth daily.      amphetamine-dextroamphetamine (ADDERALL) 20 MG tablet Take 20 mg by mouth every evening.  0   atorvastatin (LIPITOR) 20 MG tablet Take 1 tablet (20 mg total) by mouth daily. 30 tablet 3   Bempedoic Acid-Ezetimibe (NEXLIZET) 180-10 MG TABS Take 1 tablet by mouth daily. 90 tablet 3   Blood Glucose Monitoring Suppl (ACCU-CHEK GUIDE) w/Device KIT 1 kit by Does not apply route 3 (three) times daily. To check blood sugars 1 kit 0   cariprazine (VRAYLAR) capsule 4.5 mg daily. 4.5 mg daily     Cyanocobalamin (VITAMIN B 12 PO) Take 1 tablet by mouth daily.      diclofenac Sodium (VOLTAREN) 1 % GEL Apply topically.     doxepin (SINEQUAN) 75 MG capsule Take 75 mg by mouth at bedtime.  0   estradiol (ESTRACE VAGINAL) 0.1 MG/GM vaginal cream 1 g per vagina daily 42.5 g 1   glucose blood (ACCU-CHEK GUIDE) test strip Use as instructed 100 each 12   isosorbide mononitrate (IMDUR) 30 MG 24 hr tablet Take 1 tablet (30 mg total) by mouth daily. 90 tablet 3   levothyroxine (SYNTHROID) 125 MCG tablet Take 1 tablet (125 mcg total) by mouth daily before breakfast. 90 tablet 0   linaclotide (LINZESS) 290 MCG CAPS capsule Take 290 mcg by mouth at bedtime.     morphine (MS CONTIN) 15 MG 12 hr tablet Take 1 tablet (15 mg total) by mouth every 12 (twelve) hours. 60 tablet 0   Multiple Vitamin (MULTIVITAMIN) tablet Take 1 tablet by mouth every morning.      nabumetone (RELAFEN) 750 MG tablet Take 750 mg by mouth 2 (two) times daily.     naloxone (NARCAN) nasal spray 4 mg/0.1 mL Narcan 4 mg/actuation nasal spray  USE 1 SPRAY INTRANASALLY IN 1 NOSTRIL. MAY REPEAT DOSE EVERY 2 TO 3 MINUTES AS NEEDED ALTERNATING NOSTRILS FOR OVERDOSE     nitroGLYCERIN (NITROSTAT) 0.4 MG SL tablet DISSOLVE 1 TABLET UNDER THE TONGUE EVERY 5 MINUTES AS NEEDED 25  tablet 3   Olmesartan-amLODIPine-HCTZ 40-5-25 MG TABS Take 1 tablet by mouth daily. 90 tablet 3   omeprazole (PRILOSEC) 40 MG capsule TAKE 1 CAPSULE(40 MG) BY MOUTH DAILY 90 capsule 1   oxybutynin (DITROPAN) 5 MG tablet Take 1 tablet (5 mg total) by mouth 2 (two) times daily. (Patient not taking: Reported on  12/11/2020) 60 tablet 3   Oxycodone HCl 10 MG TABS Take 1 tablet (10 mg total) by mouth every 6 (six) hours as needed (for pain). 120 tablet 0   pregabalin (LYRICA) 100 MG capsule Take 1 capsule (100 mg total) by mouth 3 (three) times daily. (Patient not taking: Reported on 12/11/2020) 90 capsule 1   spironolactone (ALDACTONE) 25 MG tablet Take 0.5 tablets (12.5 mg total) by mouth daily. 45 tablet 3   umeclidinium-vilanterol (ANORO ELLIPTA) 62.5-25 MCG/INH AEPB Inhale 1 puff into the lungs daily. (Patient not taking: Reported on 12/11/2020) 1 each 0   vortioxetine HBr (TRINTELLIX) 10 MG TABS tablet 10 mg daily.     [DISCONTINUED] escitalopram (LEXAPRO) 20 MG tablet Take 20 mg by mouth daily.     [DISCONTINUED] LATUDA 120 MG TABS Take 120 mg by mouth at bedtime.   2   [DISCONTINUED] LISINOPRIL PO Take by mouth daily.       [DISCONTINUED] metoCLOPramide (REGLAN) 10 MG tablet Take 1 tablet (10 mg total) by mouth every 8 (eight) hours as needed for nausea. (Patient not taking: Reported on 12/06/2019) 30 tablet 0   No current facility-administered medications on file prior to visit.    Allergies  Allergen Reactions   Aspirin Anaphylaxis and Other (See Comments)    Pt states she has had Toradol several times without any reactions   Bee Venom Anaphylaxis   Sulfa Antibiotics Anaphylaxis   Other Other (See Comments)    No blood products.  Patient did  Request that only albumin or albumin-containing products may be administered    There were no vitals taken for this visit.   Assessment/Plan:  1. Hypertension - Blood pressure is close to goal of <130/80 in clinic today. She reports home readings  in the 060'O systolic, 45-99 diastolic with a wrist cuff that has not been calibrated.    Thank you  Ramond Dial, Pharm.D, BCPS, CPP [place]  [address] [place], [place] [address]  Phone: [phone]; Fax: [phone]

## 2021-01-15 DIAGNOSIS — F332 Major depressive disorder, recurrent severe without psychotic features: Secondary | ICD-10-CM | POA: Diagnosis not present

## 2021-01-16 DIAGNOSIS — F332 Major depressive disorder, recurrent severe without psychotic features: Secondary | ICD-10-CM | POA: Diagnosis not present

## 2021-01-19 DIAGNOSIS — F332 Major depressive disorder, recurrent severe without psychotic features: Secondary | ICD-10-CM | POA: Diagnosis not present

## 2021-01-20 DIAGNOSIS — F332 Major depressive disorder, recurrent severe without psychotic features: Secondary | ICD-10-CM | POA: Diagnosis not present

## 2021-01-21 DIAGNOSIS — F332 Major depressive disorder, recurrent severe without psychotic features: Secondary | ICD-10-CM | POA: Diagnosis not present

## 2021-01-22 DIAGNOSIS — F332 Major depressive disorder, recurrent severe without psychotic features: Secondary | ICD-10-CM | POA: Diagnosis not present

## 2021-01-23 DIAGNOSIS — F332 Major depressive disorder, recurrent severe without psychotic features: Secondary | ICD-10-CM | POA: Diagnosis not present

## 2021-01-26 ENCOUNTER — Other Ambulatory Visit: Payer: Self-pay | Admitting: *Deleted

## 2021-01-26 DIAGNOSIS — F332 Major depressive disorder, recurrent severe without psychotic features: Secondary | ICD-10-CM | POA: Diagnosis not present

## 2021-01-26 MED ORDER — AMLODIPINE BESYLATE 10 MG PO TABS: 10.0000 mg | ORAL_TABLET | Freq: Every day | ORAL | 3 refills | Status: DC

## 2021-01-27 DIAGNOSIS — M5416 Radiculopathy, lumbar region: Secondary | ICD-10-CM | POA: Diagnosis not present

## 2021-01-27 DIAGNOSIS — M1712 Unilateral primary osteoarthritis, left knee: Secondary | ICD-10-CM | POA: Diagnosis not present

## 2021-01-27 DIAGNOSIS — G894 Chronic pain syndrome: Secondary | ICD-10-CM | POA: Diagnosis not present

## 2021-01-27 DIAGNOSIS — M25532 Pain in left wrist: Secondary | ICD-10-CM | POA: Diagnosis not present

## 2021-01-27 DIAGNOSIS — F332 Major depressive disorder, recurrent severe without psychotic features: Secondary | ICD-10-CM | POA: Diagnosis not present

## 2021-01-28 DIAGNOSIS — F332 Major depressive disorder, recurrent severe without psychotic features: Secondary | ICD-10-CM | POA: Diagnosis not present

## 2021-01-29 DIAGNOSIS — M5416 Radiculopathy, lumbar region: Secondary | ICD-10-CM | POA: Diagnosis not present

## 2021-01-30 DIAGNOSIS — F332 Major depressive disorder, recurrent severe without psychotic features: Secondary | ICD-10-CM | POA: Diagnosis not present

## 2021-02-02 DIAGNOSIS — F332 Major depressive disorder, recurrent severe without psychotic features: Secondary | ICD-10-CM | POA: Diagnosis not present

## 2021-02-03 DIAGNOSIS — E119 Type 2 diabetes mellitus without complications: Secondary | ICD-10-CM | POA: Diagnosis not present

## 2021-02-03 DIAGNOSIS — H04123 Dry eye syndrome of bilateral lacrimal glands: Secondary | ICD-10-CM | POA: Diagnosis not present

## 2021-02-03 DIAGNOSIS — H2513 Age-related nuclear cataract, bilateral: Secondary | ICD-10-CM | POA: Diagnosis not present

## 2021-02-03 DIAGNOSIS — F332 Major depressive disorder, recurrent severe without psychotic features: Secondary | ICD-10-CM | POA: Diagnosis not present

## 2021-02-04 DIAGNOSIS — F332 Major depressive disorder, recurrent severe without psychotic features: Secondary | ICD-10-CM | POA: Diagnosis not present

## 2021-02-06 DIAGNOSIS — F332 Major depressive disorder, recurrent severe without psychotic features: Secondary | ICD-10-CM | POA: Diagnosis not present

## 2021-02-09 DIAGNOSIS — F332 Major depressive disorder, recurrent severe without psychotic features: Secondary | ICD-10-CM | POA: Diagnosis not present

## 2021-02-10 DIAGNOSIS — F332 Major depressive disorder, recurrent severe without psychotic features: Secondary | ICD-10-CM | POA: Diagnosis not present

## 2021-02-11 DIAGNOSIS — F332 Major depressive disorder, recurrent severe without psychotic features: Secondary | ICD-10-CM | POA: Diagnosis not present

## 2021-02-12 ENCOUNTER — Ambulatory Visit: Payer: Medicare HMO | Admitting: Skilled Nursing Facility1

## 2021-02-12 DIAGNOSIS — F332 Major depressive disorder, recurrent severe without psychotic features: Secondary | ICD-10-CM | POA: Diagnosis not present

## 2021-02-15 ENCOUNTER — Telehealth: Payer: Self-pay

## 2021-02-15 NOTE — Telephone Encounter (Signed)
Called pt to schedule Annual Wellness Visit, No answer left message to call [facility] to schedule at [phone]  

## 2021-02-17 DIAGNOSIS — F332 Major depressive disorder, recurrent severe without psychotic features: Secondary | ICD-10-CM | POA: Diagnosis not present

## 2021-02-18 DIAGNOSIS — F332 Major depressive disorder, recurrent severe without psychotic features: Secondary | ICD-10-CM | POA: Diagnosis not present

## 2021-02-19 DIAGNOSIS — F332 Major depressive disorder, recurrent severe without psychotic features: Secondary | ICD-10-CM | POA: Diagnosis not present

## 2021-02-20 DIAGNOSIS — F332 Major depressive disorder, recurrent severe without psychotic features: Secondary | ICD-10-CM | POA: Diagnosis not present

## 2021-02-23 ENCOUNTER — Telehealth: Payer: Self-pay

## 2021-02-23 ENCOUNTER — Telehealth: Payer: Self-pay | Admitting: Family Medicine

## 2021-02-23 DIAGNOSIS — F332 Major depressive disorder, recurrent severe without psychotic features: Secondary | ICD-10-CM | POA: Diagnosis not present

## 2021-02-23 NOTE — Telephone Encounter (Signed)
Pt scheduled for her annual wellness visit on 9/14 at 4pm

## 2021-02-23 NOTE — Telephone Encounter (Signed)
Called pt to schedule annual wellness visit, pt states she needs me to call her back tomorrow, she was on  conference call. Pt aware I will call back tomorrow afternoon.

## 2021-02-23 NOTE — Telephone Encounter (Signed)
Medication Refill - Medication: Pt called to report that she is completely out of her current supply of both:   Flonase  Levocetirizine 5 MG    Has the patient contacted their pharmacy? Yes.   (Agent: If no, request that the patient contact the pharmacy for the refill.) (Agent: If yes, when and what did the pharmacy advise?)  Preferred Pharmacy (with phone number or street name):  [facility] [address], [address] [place]  [place] [address]  Phone: [phone] Fax: [phone]    Agent: Please be advised that RX refills may take up to 3 business days. We ask that you follow-up with your pharmacy.

## 2021-02-24 DIAGNOSIS — F332 Major depressive disorder, recurrent severe without psychotic features: Secondary | ICD-10-CM | POA: Diagnosis not present

## 2021-02-24 NOTE — Telephone Encounter (Signed)
Patient due for diabetes f/u. Need OV.  Called patient left voicemail to schedule an appt.

## 2021-02-25 ENCOUNTER — Ambulatory Visit (HOSPITAL_BASED_OUTPATIENT_CLINIC_OR_DEPARTMENT_OTHER): Payer: Medicare HMO

## 2021-02-25 ENCOUNTER — Telehealth: Payer: Self-pay

## 2021-02-25 DIAGNOSIS — Z0000 Encounter for general adult medical examination without abnormal findings: Secondary | ICD-10-CM | POA: Diagnosis not present

## 2021-02-25 DIAGNOSIS — F332 Major depressive disorder, recurrent severe without psychotic features: Secondary | ICD-10-CM | POA: Diagnosis not present

## 2021-02-25 MED ORDER — CETIRIZINE HCL 10 MG PO TABS: 10.0000 mg | ORAL_TABLET | Freq: Every day | ORAL | 1 refills | Status: DC

## 2021-02-25 NOTE — Telephone Encounter (Signed)
Please have her call [phone] for an appointment.  Allergy medication sent to her pharmacy.  Thanks

## 2021-02-25 NOTE — Progress Notes (Signed)
Subjective:   Caroline KETTER is a 60 y.o. female who presents for an Initial Medicare Annual Wellness Visit.I connected with  [name] on 02/25/21 by a Audio enabled telemedicine application and verified that I am speaking with the correct person using two identifiers.   I discussed the limitations of evaluation and management by telemedicine. The patient expressed understanding and agreed to proceed.  Location of Patient:Home Location of Provider:Office  Persons participating in visit: Caroline Williams (patient) Quintella Baton cma  Review of Systems    Defer to PCP Cardiac Risk Factors include: none     Objective:    Today's Vitals   02/25/21 1601  PainSc: 7    There is no height or weight on file to calculate BMI.  Advanced Directives 02/25/2021 11/12/2019 03/14/2018 03/09/2018 08/31/2017 08/29/2017 07/19/2016  Does Patient Have a Medical Advance Directive? _0  No No  Would patient like information on creating a medical advance directive? No - Patient declined No - Patient declined - - - No - Patient declined -    Current Medications (verified) Outpatient Encounter Medications as of 02/25/2021  Medication Sig   albuterol (VENTOLIN HFA) 108 (90 Base) MCG/ACT inhaler INHALE 2 PUFFS INTO THE LUNGS EVERY 6 HOURS AS NEEDED FOR WHEEZING OR SHORTNESS OF BREATH   Alirocumab (PRALUENT) 150 MG/ML SOAJ Inject 1 pen into the skin every 14 (fourteen) days.   ALPRAZolam (XANAX) 1 MG tablet Take 1 mg by mouth 4 (four) times daily.    amLODipine (NORVASC) 10 MG tablet Take 1 tablet (10 mg total) by mouth daily.   amphetamine-dextroamphetamine (ADDERALL XR) 30 MG 24 hr capsule Take 30 mg by mouth daily.    amphetamine-dextroamphetamine (ADDERALL) 20 MG tablet Take 20 mg by mouth every evening.   atorvastatin (LIPITOR) 20 MG tablet Take 1 tablet (20 mg total) by mouth daily.   Bempedoic Acid-Ezetimibe (NEXLIZET) 180-10 MG TABS Take 1 tablet by mouth daily.   Blood Glucose  Monitoring Suppl (ACCU-CHEK GUIDE) w/Device KIT 1 kit by Does not apply route 3 (three) times daily. To check blood sugars   cariprazine (VRAYLAR) capsule 4.5 mg daily. 4.5 mg daily   Cyanocobalamin (VITAMIN B 12 PO) Take 1 tablet by mouth daily.    diclofenac Sodium (VOLTAREN) 1 % GEL Apply topically.   doxepin (SINEQUAN) 75 MG capsule Take 75 mg by mouth at bedtime.   estradiol (ESTRACE VAGINAL) 0.1 MG/GM vaginal cream 1 g per vagina daily   glucose blood (ACCU-CHEK GUIDE) test strip Use as instructed   isosorbide mononitrate (IMDUR) 30 MG 24 hr tablet Take 1 tablet (30 mg total) by mouth daily.   levothyroxine (SYNTHROID) 125 MCG tablet Take 1 tablet (125 mcg total) by mouth daily before breakfast.   linaclotide (LINZESS) 290 MCG CAPS capsule Take 290 mcg by mouth at bedtime.   morphine (MS CONTIN) 15 MG 12 hr tablet Take 1 tablet (15 mg total) by mouth every 12 (twelve) hours.   Multiple Vitamin (MULTIVITAMIN) tablet Take 1 tablet by mouth every morning.    nabumetone (RELAFEN) 750 MG tablet Take 750 mg by mouth 2 (two) times daily.   naloxone (NARCAN) nasal spray 4 mg/0.1 mL Narcan 4 mg/actuation nasal spray  USE 1 SPRAY INTRANASALLY IN 1 NOSTRIL. MAY REPEAT DOSE EVERY 2 TO 3 MINUTES AS NEEDED ALTERNATING NOSTRILS FOR OVERDOSE   nitroGLYCERIN (NITROSTAT) 0.4 MG SL tablet DISSOLVE 1 TABLET UNDER THE TONGUE EVERY 5 MINUTES AS NEEDED   Olmesartan-amLODIPine-HCTZ 40-5-25  MG TABS Take 1 tablet by mouth daily.   omeprazole (PRILOSEC) 40 MG capsule TAKE 1 CAPSULE(40 MG) BY MOUTH DAILY   oxybutynin (DITROPAN) 5 MG tablet Take 1 tablet (5 mg total) by mouth 2 (two) times daily.   Oxycodone HCl 10 MG TABS Take 1 tablet (10 mg total) by mouth every 6 (six) hours as needed (for pain).   pregabalin (LYRICA) 100 MG capsule Take 1 capsule (100 mg total) by mouth 3 (three) times daily.   spironolactone (ALDACTONE) 25 MG tablet Take 0.5 tablets (12.5 mg total) by mouth daily.   umeclidinium-vilanterol  (ANORO ELLIPTA) 62.5-25 MCG/INH AEPB Inhale 1 puff into the lungs daily.   vortioxetine HBr (TRINTELLIX) 10 MG TABS tablet 10 mg daily.   [DISCONTINUED] escitalopram (LEXAPRO) 20 MG tablet Take 20 mg by mouth daily.   [DISCONTINUED] LATUDA 120 MG TABS Take 120 mg by mouth at bedtime.    [DISCONTINUED] LISINOPRIL PO Take by mouth daily.     [DISCONTINUED] metoCLOPramide (REGLAN) 10 MG tablet Take 1 tablet (10 mg total) by mouth every 8 (eight) hours as needed for nausea. (Patient not taking: Reported on 12/06/2019)   No facility-administered encounter medications on file as of 02/25/2021.    Allergies (verified) Aspirin, Bee venom, Sulfa antibiotics, and Other   History: Past Medical History:  Diagnosis Date   Anxiety    Arthritis    Asthma    Bipolar 1 disorder ([place])    Breast discharge    Breast lump    Breast pain    Chronic pain    on MS Contin and oxycodone   Depression    Diabetes mellitus    diet and exercise controlled   Escherichia coli (E. coli) infection    Fever    GERD (gastroesophageal reflux disease)    History of chest pain    History of kidney stones    History of knee replacement, total    Hypertension    Hypothyroidism    N&V (nausea and vomiting)    Peripheral neuropathy    Right foot no sensation   Pneumonia    2015,2014   Sleep apnea    does not use CPAP   Thyroid disease    Wears glasses    Past Surgical History:  Procedure Laterality Date   ABDOMINAL HYSTERECTOMY     partial   APPENDECTOMY     BIOPSY  03/14/2018   Procedure: BIOPSY;  Surgeon: Wilford Corner, MD;  Location: [facility];  Service: Endoscopy;;   BREAST DUCTAL SYSTEM EXCISION Right 08/31/2017   Procedure: RIGHT BREAST CENTRAL DUCT EXCISION;  Surgeon: Erroll Luna, MD;  Location: [place];  Service: General;  Laterality: Right;   BREAST EXCISIONAL BIOPSY Right    BREAST LUMPECTOMY     right   CARDIAC CATHETERIZATION     no significant CAD, nl LV function  by 11/08/06 cath   CESAREAN SECTION     x4   COLONOSCOPY WITH PROPOFOL N/A 03/14/2018   Procedure: COLONOSCOPY WITH PROPOFOL;  Surgeon: Wilford Corner, MD;  Location: [facility];  Service: Endoscopy;  Laterality: N/A;   GASTRIC ROUX-EN-Y N/A 11/25/2014   Procedure: LAPAROSCOPIC ROUX-EN-Y GASTRIC BYPASS WITH UPPER ENDOSCOPY;  Surgeon: Johnathan Hausen, MD;  Location: [facility];  Service: General;  Laterality: N/A;   HERNIA REPAIR     JOINT REPLACEMENT     KNEE SURGERY     right   [place]  POLYPECTOMY  03/14/2018   Procedure: POLYPECTOMY;  Surgeon: Wilford Corner, MD;  Location: [facility];  Service: Endoscopy;;   TOTAL KNEE REVISION  06/21/2012   Procedure: TOTAL KNEE REVISION;  Surgeon: Newt Minion, MD;  Location: [place];  Service: Orthopedics;  Laterality: Right;  Revision Right Total Knee Arthroplasty   Family History  Problem Relation Age of Onset   Cancer Father        colon   Stroke Father    Heart disease Father    Diabetes Father    Hypertension Father    Depression Sister    Hypertension Mother    Breast cancer Neg Hx    Social History   Socioeconomic History   Marital status: Single    Spouse name: Not on file   Number of children: Not on file   Years of education: Not on file   Highest education level: Not on file  Occupational History   Not on file  Tobacco Use   Smoking status: Former    Packs/day: 0.25    Years: 43.00    Pack years: 10.75    Types: Cigarettes    Quit date: 2020    Years since quitting: 2.7   Smokeless tobacco: Never  Vaping Use   Vaping Use: Never used  Substance and Sexual Activity   Alcohol use: Yes    Comment: social   Drug use: No   Sexual activity: Yes    Birth control/protection: Surgical  Other Topics Concern   Not on file  Social History Narrative   Not on file   Social Determinants of Health   Financial Resource Strain: Medium Risk   Difficulty of Paying Living  Expenses: Somewhat hard  Food Insecurity: No Food Insecurity   Worried About Charity fundraiser in the Last Year: Never true   Ran Out of Food in the Last Year: Never true  Transportation Needs: No Transportation Needs   Lack of Transportation (Medical): No   Lack of Transportation (Non-Medical): No  Physical Activity: Inactive   Days of Exercise per Week: 0 days   Minutes of Exercise per Session: 0 min  Stress: No Stress Concern Present   Feeling of Stress : Only a little  Social Connections: Moderately Integrated   Frequency of Communication with Friends and Family: More than three times a week   Frequency of Social Gatherings with Friends and Family: Never   Attends Religious Services: 1 to 4 times per year   Active Member of [employer] or Organizations: No   Attends Music therapist: Never   Marital Status: Living with partner    Tobacco Counseling Counseling given: Not Answered   Clinical Intake:  Pre-visit preparation completed: Yes  Pain : 0-10 Pain Score: 7  Pain Location: Knee Pain Orientation: Right Pain Onset: More than a month ago Pain Frequency: Constant     Nutritional Risks: None Diabetes: Yes CBG done?: Yes CBG resulted in Enter/ Edit results?: Yes (134) Did pt. bring in CBG monitor from home?: No  How often do you need to have someone help you when you read instructions, pamphlets, or other written materials from your doctor or pharmacy?: 3 - Sometimes  Diabetic?Yes  Interpreter Needed?: No      Activities of Daily Living In your present state of health, do you have any difficulty performing the following activities: 02/25/2021  Hearing? N  Vision? Y  Difficulty concentrating or making decisions? Y  Walking or climbing stairs? Darreld Mclean  Dressing or bathing? Y  Doing errands, shopping? Y  Preparing Food and eating ? Y  Using the Toilet? N  In the past six months, have you accidently leaked urine? Y  Do you have problems with loss of  bowel control? N  Managing your Medications? N  Managing your Finances? N  Housekeeping or managing your Housekeeping? Y  Some recent data might be hidden    Patient Care Team: Charlott Rakes, MD as PCP - General (Family Medicine)  Indicate any recent Medical Services you may have received from other than [facility] providers in the past year (date may be approximate).     Assessment:   This is a routine wellness examination for Jaleeya.  Hearing/Vision screen No results found.  Dietary issues and exercise activities discussed: Exercise limited by: None identified   Goals Addressed   None    Depression Screen PHQ 2/9 Scores 02/25/2021 02/25/2021 10/13/2020 08/21/2020 12/06/2019 08/08/2019 09/07/2017  PHQ - 2 Score 0 0 0 2 1 0 0  PHQ- 9 Score - - - 6 3 0 -    Fall Risk Fall Risk  02/25/2021 10/13/2020 08/21/2020 12/06/2019 08/08/2019  Falls in the past year? 0 0 1 0 0  Number falls in past yr: 0 - 0 - 0  Injury with Fall? 0 - 0 - -    FALL RISK PREVENTION PERTAINING TO THE HOME:  Any stairs in or around the home? No  If so, are there any without handrails? No  Home free of loose throw rugs in walkways, pet beds, electrical cords, etc? No  Adequate lighting in your home to reduce risk of falls? No   ASSISTIVE DEVICES UTILIZED TO PREVENT FALLS:  Life alert? No  Use of a cane, walker or w/c? yes Grab bars in the bathroom? No  Shower chair or bench in shower? Yes  Elevated toilet seat or a handicapped toilet? No   TIMED UP AND GO:  Was the test performed? No .  Length of time to ambulate 10 feet: n/a sec.     Cognitive Function:     6CIT Screen 02/25/2021  What Year? 0 points  What month? 0 points  What time? 0 points  Count back from 20 2 points  Months in reverse 2 points  Repeat phrase 0 points  Total Score 4    Immunizations Immunization History  Administered Date(s) Administered   Influenza Inj Mdck Quad Pf 02/26/2019   Influenza,inj,Quad PF,6+ Mos 03/13/2015,  03/13/2018   Influenza,inj,quad, With Preservative 03/13/2018, 02/26/2019   Zoster Recombinat (Shingrix) 08/25/2018    TDAP status: Due, Education has been provided regarding the importance of this vaccine. Advised may receive this vaccine at local pharmacy or Health Dept. Aware to provide a copy of the vaccination record if obtained from local pharmacy or Health Dept. Verbalized acceptance and understanding.  Flu Vaccine status: Due, Education has been provided regarding the importance of this vaccine. Advised may receive this vaccine at local pharmacy or Health Dept. Aware to provide a copy of the vaccination record if obtained from local pharmacy or Health Dept. Verbalized acceptance and understanding.  Pneumococcal vaccine status: Due, Education has been provided regarding the importance of this vaccine. Advised may receive this vaccine at local pharmacy or Health Dept. Aware to provide a copy of the vaccination record if obtained from local pharmacy or Health Dept. Verbalized acceptance and understanding.  Covid-19 vaccine status: Declined, Education has been provided regarding the importance of this vaccine but patient still  declined. Advised may receive this vaccine at local pharmacy or Health Dept.or vaccine clinic. Aware to provide a copy of the vaccination record if obtained from local pharmacy or Health Dept. Verbalized acceptance and understanding.  Qualifies for Shingles Vaccine? Yes   Zostavax completed No   Shingrix Completed?: No.    Education has been provided regarding the importance of this vaccine. Patient has been advised to call insurance company to determine out of pocket expense if they have not yet received this vaccine. Advised may also receive vaccine at local pharmacy or Health Dept. Verbalized acceptance and understanding.  Screening Tests Health Maintenance  Topic Date Due   COVID-19 Vaccine (1) Never done   PNEUMOCOCCAL POLYSACCHARIDE VACCINE AGE 285-64 HIGH RISK   Never done   Pneumococcal Vaccine 62-38 Years old (1 - PCV) Never done   FOOT EXAM  Never done   OPHTHALMOLOGY EXAM  Never done   HIV Screening  Never done   Hepatitis C Screening  Never done   TETANUS/TDAP  Never done   PAP SMEAR-Modifier  03/28/2014   Zoster Vaccines- Shingrix (2 of 2) 10/20/2018   INFLUENZA VACCINE  01/12/2021   HEMOGLOBIN A1C  02/21/2021   MAMMOGRAM  02/19/2022   COLONOSCOPY (Pts 45-47yr Insurance coverage will need to be confirmed)  03/14/2028   HPV VACCINES  Aged Out    Health Maintenance  Health Maintenance Due  Topic Date Due   COVID-19 Vaccine (1) Never done   PNEUMOCOCCAL POLYSACCHARIDE VACCINE AGE 285-64 HIGH RISK  Never done   Pneumococcal Vaccine 025666Years old (1 - PCV) Never done   FOOT EXAM  Never done   OPHTHALMOLOGY EXAM  Never done   HIV Screening  Never done   Hepatitis C Screening  Never done   TETANUS/TDAP  Never done   PAP SMEAR-Modifier  03/28/2014   Zoster Vaccines- Shingrix (2 of 2) 10/20/2018   INFLUENZA VACCINE  01/12/2021   HEMOGLOBIN A1C  02/21/2021    Colorectal cancer screening: Type of screening: Colonoscopy. Completed 03/14/2018. Repeat every 10 years  Mammogram status: Completed 02/14/2020. Repeat every year    Lung Cancer Screening: (Low Dose CT Chest recommended if Age 61-80years, 30 pack-year currently smoking OR have quit w/in 15years.) does not qualify.   Lung Cancer Screening Referral: N/a  Additional Screening:  Hepatitis C Screening: does qualify; OverDue  Vision Screening: Recommended annual ophthalmology exams for early detection of glaucoma and other disorders of the eye. Is the patient up to date with their annual eye exam?  Yes  Who is the provider or what is the name of the office in which the patient attends annual eye exams? [facility] If pt is not established with a provider, would they like to be referred to a provider to establish care? No .   Dental Screening: Recommended annual dental exams  for proper oral hygiene  Community Resource Referral / Chronic Care Management: CRR required this visit?  No   CCM required this visit?  No      Plan:     I have personally reviewed and noted the following in the patient's chart:   Medical and social history Use of alcohol, tobacco or illicit drugs  Current medications and supplements including opioid prescriptions. Patient is currently taking opioid prescriptions. Information provided to patient regarding non-opioid alternatives. Patient advised to discuss non-opioid treatment plan with their provider. Functional ability and status Nutritional status Physical activity Advanced directives List of other physicians Hospitalizations, surgeries, and ER visits  in previous 12 months Vitals Screenings to include cognitive, depression, and falls Referrals and appointments  In addition, I have reviewed and discussed with patient certain preventive protocols, quality metrics, and best practice recommendations. A written personalized care plan for preventive services as well as general preventive health recommendations were provided to patient.     Quintella Baton, [facility]   02/25/2021   Nurse Notes: 60 minute Non Face To Face visit. Ms. Sokol , Thank you for taking time to come for your Medicare Wellness Visit. I appreciate your ongoing commitment to your health goals. Please review the following plan we discussed and let me know if I can assist you in the future.   These are the goals we discussed:  Goals   None     This is a list of the screening recommended for you and due dates:  Health Maintenance  Topic Date Due   COVID-19 Vaccine (1) Never done   Pneumococcal vaccine  Never done   Pneumococcal Vaccination (1 - PCV) Never done   Complete foot exam   Never done   Eye exam for diabetics  Never done   HIV Screening  Never done   Hepatitis C Screening: USPSTF Recommendation to screen - Ages 63-79 yo.  Never done   Tetanus  Vaccine  Never done   Pap Smear  03/28/2014   Zoster (Shingles) Vaccine (2 of 2) 10/20/2018   Flu Shot  01/12/2021   Hemoglobin A1C  02/21/2021   Mammogram  02/19/2022   Colon Cancer Screening  03/14/2028   HPV Vaccine  Aged Out

## 2021-02-25 NOTE — Telephone Encounter (Signed)
I contacted pt today to complete her annual wellness visit.She would like for someone from the office to call her to schedule an appointment. She needs refill on some allergy medication. Please advise

## 2021-02-25 NOTE — Patient Instructions (Signed)
Health Maintenance, Female Adopting a healthy lifestyle and getting preventive care are important in promoting health and wellness. Ask your health care provider about: The right schedule for you to have regular tests and exams. Things you can do on your own to prevent diseases and keep yourself healthy. What should I know about diet, weight, and exercise? Eat a healthy diet  Eat a diet that includes plenty of vegetables, fruits, low-fat dairy products, and lean protein. Do not eat a lot of foods that are high in solid fats, added sugars, or sodium. Maintain a healthy weight Body mass index (BMI) is used to identify weight problems. It estimates body fat based on height and weight. Your health care provider can help determine your BMI and help you achieve or maintain a healthy weight. Get regular exercise Get regular exercise. This is one of the most important things you can do for your health. Most adults should: Exercise for at least 150 minutes each week. The exercise should increase your heart rate and make you sweat (moderate-intensity exercise). Do strengthening exercises at least twice a week. This is in addition to the moderate-intensity exercise. Spend less time sitting. Even light physical activity can be beneficial. Watch cholesterol and blood lipids Have your blood tested for lipids and cholesterol at 61 years of age, then have this test every 5 years. Have your cholesterol levels checked more often if: Your lipid or cholesterol levels are high. You are older than 61 years of age. You are at high risk for heart disease. What should I know about cancer screening? Depending on your health history and family history, you may need to have cancer screening at various ages. This may include screening for: Breast cancer. Cervical cancer. Colorectal cancer. Skin cancer. Lung cancer. What should I know about heart disease, diabetes, and high blood pressure? Blood pressure and heart  disease High blood pressure causes heart disease and increases the risk of stroke. This is more likely to develop in people who have high blood pressure readings, are of African descent, or are overweight. Have your blood pressure checked: Every 3-5 years if you are 18-39 years of age. Every year if you are 40 years old or older. Diabetes Have regular diabetes screenings. This checks your fasting blood sugar level. Have the screening done: Once every three years after age 40 if you are at a normal weight and have a low risk for diabetes. More often and at a younger age if you are overweight or have a high risk for diabetes. What should I know about preventing infection? Hepatitis B If you have a higher risk for hepatitis B, you should be screened for this virus. Talk with your health care provider to find out if you are at risk for hepatitis B infection. Hepatitis C Testing is recommended for: Everyone born from 1945 through 1965. Anyone with known risk factors for hepatitis C. Sexually transmitted infections (STIs) Get screened for STIs, including gonorrhea and chlamydia, if: You are sexually active and are younger than 61 years of age. You are older than 61 years of age and your health care provider tells you that you are at risk for this type of infection. Your sexual activity has changed since you were last screened, and you are at increased risk for chlamydia or gonorrhea. Ask your health care provider if you are at risk. Ask your health care provider about whether you are at high risk for HIV. Your health care provider may recommend a prescription medicine   to help prevent HIV infection. If you choose to take medicine to prevent HIV, you should first get tested for HIV. You should then be tested every 3 months for as long as you are taking the medicine. Pregnancy If you are about to stop having your period (premenopausal) and you may become pregnant, seek counseling before you get  pregnant. Take 400 to 800 micrograms (mcg) of folic acid every day if you become pregnant. Ask for birth control (contraception) if you want to prevent pregnancy. Osteoporosis and menopause Osteoporosis is a disease in which the bones lose minerals and strength with aging. This can result in bone fractures. If you are 65 years old or older, or if you are at risk for osteoporosis and fractures, ask your health care provider if you should: Be screened for bone loss. Take a calcium or vitamin D supplement to lower your risk of fractures. Be given hormone replacement therapy (HRT) to treat symptoms of menopause. Follow these instructions at home: Lifestyle Do not use any products that contain nicotine or tobacco, such as cigarettes, e-cigarettes, and chewing tobacco. If you need help quitting, ask your health care provider. Do not use street drugs. Do not share needles. Ask your health care provider for help if you need support or information about quitting drugs. Alcohol use Do not drink alcohol if: Your health care provider tells you not to drink. You are pregnant, may be pregnant, or are planning to become pregnant. If you drink alcohol: Limit how much you use to 0-1 drink a day. Limit intake if you are breastfeeding. Be aware of how much alcohol is in your drink. In the [place] one drink equals one 12 oz bottle of beer (355 mL), one 5 oz glass of wine (148 mL), or one 1 oz glass of hard liquor (44 mL). General instructions Schedule regular health, dental, and eye exams. Stay current with your vaccines. Tell your health care provider if: You often feel depressed. You have ever been abused or do not feel safe at home. Summary Adopting a healthy lifestyle and getting preventive care are important in promoting health and wellness. Follow your health care provider's instructions about healthy diet, exercising, and getting tested or screened for diseases. Follow your health care provider's  instructions on monitoring your cholesterol and blood pressure. This information is not intended to replace advice given to you by your health care provider. Make sure you discuss any questions you have with your health care provider. Document Revised: 08/08/2020 Document Reviewed: 05/24/2018 Elsevier Patient Education  2022 [employer] Inc[employer]  

## 2021-02-26 DIAGNOSIS — F332 Major depressive disorder, recurrent severe without psychotic features: Secondary | ICD-10-CM | POA: Diagnosis not present

## 2021-02-27 DIAGNOSIS — F332 Major depressive disorder, recurrent severe without psychotic features: Secondary | ICD-10-CM | POA: Diagnosis not present

## 2021-03-02 DIAGNOSIS — F332 Major depressive disorder, recurrent severe without psychotic features: Secondary | ICD-10-CM | POA: Diagnosis not present

## 2021-03-04 DIAGNOSIS — F332 Major depressive disorder, recurrent severe without psychotic features: Secondary | ICD-10-CM | POA: Diagnosis not present

## 2021-03-04 DIAGNOSIS — F250 Schizoaffective disorder, bipolar type: Secondary | ICD-10-CM | POA: Diagnosis not present

## 2021-03-04 DIAGNOSIS — F4001 Agoraphobia with panic disorder: Secondary | ICD-10-CM | POA: Diagnosis not present

## 2021-03-05 DIAGNOSIS — F332 Major depressive disorder, recurrent severe without psychotic features: Secondary | ICD-10-CM | POA: Diagnosis not present

## 2021-03-13 DIAGNOSIS — Z96651 Presence of right artificial knee joint: Secondary | ICD-10-CM | POA: Diagnosis not present

## 2021-03-13 DIAGNOSIS — M1712 Unilateral primary osteoarthritis, left knee: Secondary | ICD-10-CM | POA: Diagnosis not present

## 2021-03-13 DIAGNOSIS — M25561 Pain in right knee: Secondary | ICD-10-CM | POA: Diagnosis not present

## 2021-03-13 DIAGNOSIS — Z6835 Body mass index (BMI) 35.0-35.9, adult: Secondary | ICD-10-CM | POA: Diagnosis not present

## 2021-03-19 DIAGNOSIS — M19031 Primary osteoarthritis, right wrist: Secondary | ICD-10-CM | POA: Diagnosis not present

## 2021-03-25 ENCOUNTER — Telehealth: Payer: Self-pay | Admitting: *Deleted

## 2021-03-25 NOTE — Telephone Encounter (Signed)
Copied from [place] [id number]. Topic: General - Other >> Mar 24, 2021  4:00 PM Bayard Beaver wrote: Reason for [id number] calld wants to have labs done befor app on 06/16/21.

## 2021-03-26 NOTE — Telephone Encounter (Signed)
Pt has been called and given an earlier appointment for 04/07/2021

## 2021-04-01 DIAGNOSIS — F332 Major depressive disorder, recurrent severe without psychotic features: Secondary | ICD-10-CM | POA: Diagnosis not present

## 2021-04-04 ENCOUNTER — Other Ambulatory Visit: Payer: Self-pay | Admitting: Interventional Cardiology

## 2021-04-07 ENCOUNTER — Other Ambulatory Visit: Payer: Self-pay

## 2021-04-07 ENCOUNTER — Encounter: Payer: Self-pay | Admitting: Family Medicine

## 2021-04-07 ENCOUNTER — Ambulatory Visit: Payer: Medicare HMO | Attending: Family Medicine | Admitting: Family Medicine

## 2021-04-07 VITALS — BP 174/102 | HR 76 | Ht 65.0 in | Wt 219.2 lb

## 2021-04-07 DIAGNOSIS — J3089 Other allergic rhinitis: Secondary | ICD-10-CM

## 2021-04-07 DIAGNOSIS — M792 Neuralgia and neuritis, unspecified: Secondary | ICD-10-CM

## 2021-04-07 DIAGNOSIS — E039 Hypothyroidism, unspecified: Secondary | ICD-10-CM | POA: Diagnosis not present

## 2021-04-07 DIAGNOSIS — Z1159 Encounter for screening for other viral diseases: Secondary | ICD-10-CM | POA: Diagnosis not present

## 2021-04-07 DIAGNOSIS — N941 Unspecified dyspareunia: Secondary | ICD-10-CM | POA: Diagnosis not present

## 2021-04-07 DIAGNOSIS — E1140 Type 2 diabetes mellitus with diabetic neuropathy, unspecified: Secondary | ICD-10-CM

## 2021-04-07 DIAGNOSIS — E1159 Type 2 diabetes mellitus with other circulatory complications: Secondary | ICD-10-CM

## 2021-04-07 DIAGNOSIS — I152 Hypertension secondary to endocrine disorders: Secondary | ICD-10-CM

## 2021-04-07 DIAGNOSIS — Z23 Encounter for immunization: Secondary | ICD-10-CM | POA: Diagnosis not present

## 2021-04-07 DIAGNOSIS — N3946 Mixed incontinence: Secondary | ICD-10-CM

## 2021-04-07 LAB — POCT GLYCOSYLATED HEMOGLOBIN (HGB A1C): HbA1c, POC (controlled diabetic range): 5.8 % (ref 0.0–7.0)

## 2021-04-07 MED ORDER — CETIRIZINE HCL 10 MG PO TABS: 10.0000 mg | ORAL_TABLET | Freq: Every day | ORAL | 1 refills | Status: DC

## 2021-04-07 MED ORDER — OXYBUTYNIN CHLORIDE 5 MG PO TABS: 5.0000 mg | ORAL_TABLET | Freq: Two times a day (BID) | ORAL | 3 refills | Status: DC

## 2021-04-07 MED ORDER — FLUTICASONE PROPIONATE 50 MCG/ACT NA SUSP: 2.0000 | Freq: Every day | NASAL | 6 refills | Status: DC

## 2021-04-07 MED ORDER — PREGABALIN 100 MG PO CAPS: 100.0000 mg | ORAL_CAPSULE | Freq: Two times a day (BID) | ORAL | 5 refills | Status: DC

## 2021-04-07 NOTE — Progress Notes (Signed)
Subjective:  Patient ID: Caroline Williams, female    DOB: Jul 05, 1960  Age: 60 y.o. MRN: [id number]  CC: Thyroid Problem   HPI Caroline Williams is a 60 y.o. year old female with a history of type 2 diabetes mellitus (diet-controlled with A1c of 5.8), hypothyroidism, bipolar disorder, hypertension   Interval History: Her BP is elevated and she states she just received bad news prior to coming into the office.  Endorses compliance with her antihypertensive.  Her diabetes is diet controlled and she has had no hypoglycemia but does have neuropathy in her feet for which she is on Lyrica.  Up-to-date on annual eye exams with last exam 2 months ago.  She does not exercise much. She does not want  to  take statins "as she has read too many bad things about it".  She would like a referral to GYN due to dyspareunia.  I had placed her on Estrace which she still has been ineffective. Complains of urinary incontinence which is uncontrollable for the last 9 months.  In 08/2020 I placed her on oxybutynin but on further inquiry she does not have it and has not been taking it.  Incontinence is not limited to stress or urge. Request refill of Flonase and Zyrtec. Past Medical History:  Diagnosis Date   Anxiety    Arthritis    Asthma    Bipolar 1 disorder ([place])    Breast discharge    Breast lump    Breast pain    Chronic pain    on MS Contin and oxycodone   Depression    Diabetes mellitus    diet and exercise controlled   Escherichia coli (E. coli) infection    Fever    GERD (gastroesophageal reflux disease)    History of chest pain    History of kidney stones    History of knee replacement, total    Hypertension    Hypothyroidism    N&V (nausea and vomiting)    Peripheral neuropathy    Right foot no sensation   Pneumonia    2015,2014   Sleep apnea    does not use CPAP   Thyroid disease    Wears glasses     Past Surgical History:  Procedure Laterality Date   ABDOMINAL HYSTERECTOMY      partial   APPENDECTOMY     BIOPSY  03/14/2018   Procedure: BIOPSY;  Surgeon: Wilford Corner, MD;  Location: [facility];  Service: Endoscopy;;   BREAST DUCTAL SYSTEM EXCISION Right 08/31/2017   Procedure: RIGHT BREAST CENTRAL DUCT EXCISION;  Surgeon: Erroll Luna, MD;  Location: [facility];  Service: General;  Laterality: Right;   BREAST EXCISIONAL BIOPSY Right    BREAST LUMPECTOMY     right   CARDIAC CATHETERIZATION     no significant CAD, nl LV function by 11/08/06 cath   CESAREAN SECTION     x4   COLONOSCOPY WITH PROPOFOL N/A 03/14/2018   Procedure: COLONOSCOPY WITH PROPOFOL;  Surgeon: Wilford Corner, MD;  Location: [facility];  Service: Endoscopy;  Laterality: N/A;   GASTRIC ROUX-EN-Y N/A 11/25/2014   Procedure: LAPAROSCOPIC ROUX-EN-Y GASTRIC BYPASS WITH UPPER ENDOSCOPY;  Surgeon: Johnathan Hausen, MD;  Location: [facility];  Service: General;  Laterality: N/A;   HERNIA REPAIR     JOINT REPLACEMENT     KNEE SURGERY     right   [place]   POLYPECTOMY  03/14/2018  Procedure: POLYPECTOMY;  Surgeon: Wilford Corner, MD;  Location: [facility];  Service: Endoscopy;;   TOTAL KNEE REVISION  06/21/2012   Procedure: TOTAL KNEE REVISION;  Surgeon: Newt Minion, MD;  Location: [facility];  Service: Orthopedics;  Laterality: Right;  Revision Right Total Knee Arthroplasty    Family History  Problem Relation Age of Onset   Cancer Father        colon   Stroke Father    Heart disease Father    Diabetes Father    Hypertension Father    Depression Sister    Hypertension Mother    Breast cancer Neg Hx     Allergies  Allergen Reactions   Aspirin Anaphylaxis and Other (See Comments)    Pt states she has had Toradol several times without any reactions   Bee Venom Anaphylaxis   Sulfa Antibiotics Anaphylaxis   Other Other (See Comments)    No blood products.  Patient did  Request that only albumin or albumin-containing products  may be administered    Outpatient Medications Prior to Visit  Medication Sig Dispense Refill   albuterol (VENTOLIN HFA) 108 (90 Base) MCG/ACT inhaler INHALE 2 PUFFS INTO THE LUNGS EVERY 6 HOURS AS NEEDED FOR WHEEZING OR SHORTNESS OF BREATH 6.7 g 0   Alirocumab (PRALUENT) 150 MG/ML SOAJ Inject 1 pen into the skin every 14 (fourteen) days. 6 mL 3   ALPRAZolam (XANAX) 1 MG tablet Take 1 mg by mouth 4 (four) times daily.   0   amLODipine (NORVASC) 10 MG tablet Take 1 tablet (10 mg total) by mouth daily. 90 tablet 3   amphetamine-dextroamphetamine (ADDERALL XR) 30 MG 24 hr capsule Take 30 mg by mouth daily.      amphetamine-dextroamphetamine (ADDERALL) 20 MG tablet Take 20 mg by mouth every evening.  0   Bempedoic Acid-Ezetimibe (NEXLIZET) 180-10 MG TABS Take 1 tablet by mouth daily. 90 tablet 3   Blood Glucose Monitoring Suppl (ACCU-CHEK GUIDE) w/Device KIT 1 kit by Does not apply route 3 (three) times daily. To check blood sugars 1 kit 0   cariprazine (VRAYLAR) capsule 4.5 mg daily. 4.5 mg daily     Cyanocobalamin (VITAMIN B 12 PO) Take 1 tablet by mouth daily.      diclofenac Sodium (VOLTAREN) 1 % GEL Apply topically.     doxepin (SINEQUAN) 75 MG capsule Take 75 mg by mouth at bedtime.  0   glucose blood (ACCU-CHEK GUIDE) test strip Use as instructed 100 each 12   levothyroxine (SYNTHROID) 125 MCG tablet Take 1 tablet (125 mcg total) by mouth daily before breakfast. 90 tablet 0   linaclotide (LINZESS) 290 MCG CAPS capsule Take 290 mcg by mouth at bedtime.     morphine (MS CONTIN) 15 MG 12 hr tablet Take 1 tablet (15 mg total) by mouth every 12 (twelve) hours. 60 tablet 0   Multiple Vitamin (MULTIVITAMIN) tablet Take 1 tablet by mouth every morning.      nabumetone (RELAFEN) 750 MG tablet Take 750 mg by mouth 2 (two) times daily.     naloxone (NARCAN) nasal spray 4 mg/0.1 mL Narcan 4 mg/actuation nasal spray  USE 1 SPRAY INTRANASALLY IN 1 NOSTRIL. MAY REPEAT DOSE EVERY 2 TO 3 MINUTES AS NEEDED  ALTERNATING NOSTRILS FOR OVERDOSE     nitroGLYCERIN (NITROSTAT) 0.4 MG SL tablet DISSOLVE 1 TABLET UNDER THE TONGUE EVERY 5 MINUTES AS NEEDED 25 tablet 3   omeprazole (PRILOSEC) 40 MG capsule TAKE 1 CAPSULE(40 MG) BY MOUTH DAILY  90 capsule 1   Oxycodone HCl 10 MG TABS Take 1 tablet (10 mg total) by mouth every 6 (six) hours as needed (for pain). 120 tablet 0   spironolactone (ALDACTONE) 25 MG tablet Take 0.5 tablets (12.5 mg total) by mouth daily. 45 tablet 3   umeclidinium-vilanterol (ANORO ELLIPTA) 62.5-25 MCG/INH AEPB Inhale 1 puff into the lungs daily. 1 each 0   vortioxetine HBr (TRINTELLIX) 10 MG TABS tablet 10 mg daily.     cetirizine (ZYRTEC) 10 MG tablet Take 1 tablet (10 mg total) by mouth daily. 30 tablet 1   Olmesartan-amLODIPine-HCTZ 40-5-25 MG TABS Take 1 tablet by mouth daily. 90 tablet 3   oxybutynin (DITROPAN) 5 MG tablet Take 1 tablet (5 mg total) by mouth 2 (two) times daily. 60 tablet 3   pregabalin (LYRICA) 100 MG capsule Take 1 capsule (100 mg total) by mouth 3 (three) times daily. 90 capsule 1   atorvastatin (LIPITOR) 20 MG tablet Take 1 tablet (20 mg total) by mouth daily. (Patient not taking: Reported on 04/07/2021) 30 tablet 3   estradiol (ESTRACE VAGINAL) 0.1 MG/GM vaginal cream 1 g per vagina daily (Patient not taking: Reported on 04/07/2021) 42.5 g 1   isosorbide mononitrate (IMDUR) 30 MG 24 hr tablet Take 1 tablet (30 mg total) by mouth daily. (Patient not taking: Reported on 04/07/2021) 90 tablet 3   No facility-administered medications prior to visit.     ROS Review of Systems  Constitutional:  Negative for activity change, appetite change and fatigue.  HENT:  Negative for congestion, sinus pressure and sore throat.   Eyes:  Negative for visual disturbance.  Respiratory:  Negative for cough, chest tightness, shortness of breath and wheezing.   Cardiovascular:  Negative for chest pain and palpitations.  Gastrointestinal:  Negative for abdominal distention,  abdominal pain and constipation.  Endocrine: Negative for polydipsia.  Genitourinary:  Negative for dysuria and frequency.  Musculoskeletal:  Negative for arthralgias and back pain.  Skin:  Negative for rash.  Neurological:  Positive for numbness. Negative for tremors and light-headedness.  Hematological:  Does not bruise/bleed easily.  Psychiatric/Behavioral:  Negative for agitation and behavioral problems.    Objective:  BP (!) 174/102   Pulse 76   Ht 5' 5"  (1.651 m)   Wt 219 lb 3.2 oz (99.4 kg)   SpO2 98%   BMI 36.48 kg/m   BP/Weight 04/07/2021 12/30/2020 2/56/3893  Systolic BP 734 287 681  Diastolic BP 157 82 80  Wt. (Lbs) 219.2 - -  BMI 36.48 - -      Physical Exam Constitutional:      Appearance: She is well-developed.  Cardiovascular:     Rate and Rhythm: Normal rate.     Heart sounds: Normal heart sounds. No murmur heard. Pulmonary:     Effort: Pulmonary effort is normal.     Breath sounds: Normal breath sounds. No wheezing or rales.  Chest:     Chest wall: No tenderness.  Abdominal:     General: Bowel sounds are normal. There is no distension.     Palpations: Abdomen is soft. There is no mass.     Tenderness: There is no abdominal tenderness.  Musculoskeletal:        General: Normal range of motion.     Right lower leg: No edema.     Left lower leg: No edema.  Neurological:     Mental Status: She is alert and oriented to person, place, and time.  Psychiatric:  Mood and Affect: Mood normal.    CMP Latest Ref Rng & Units 12/30/2020 12/19/2020 12/11/2020  Glucose 65 - 99 mg/dL 102(H) 136(H) 93  BUN 8 - 27 mg/dL 12 11 12   Creatinine 0.57 - 1.00 mg/dL 1.19(H) 1.39(H) 1.04(H)  Sodium 134 - 144 mmol/L 137 139 139  Potassium 3.5 - 5.2 mmol/L 3.9 3.1(L) 3.2(L)  Chloride 96 - 106 mmol/L 101 100 99  CO2 20 - 29 mmol/L 23 21 26   Calcium 8.7 - 10.3 mg/dL 9.0 9.7 9.3  Total Protein 6.0 - 8.5 g/dL - - -  Total Bilirubin 0.0 - 1.2 mg/dL - - -  Alkaline Phos 44  - 121 IU/L - - -  AST 0 - 40 IU/L - - -  ALT 0 - 32 IU/L - - -    Lipid Panel     Component Value Date/Time   CHOL 110 07/14/2020 0754   TRIG 54 07/14/2020 0754   HDL 69 07/14/2020 0754   CHOLHDL 1.6 07/14/2020 0754   LDLCALC 28 07/14/2020 0754   LDLDIRECT 84 04/10/2020 0759    CBC    Component Value Date/Time   WBC 5.5 07/06/2019 0850   WBC 5.0 06/29/2018 1835   RBC 5.06 07/06/2019 0850   RBC 5.68 (H) 06/29/2018 1835   HGB 14.2 07/06/2019 0850   HCT 42.9 07/06/2019 0850   PLT 277 07/06/2019 0850   MCV 85 07/06/2019 0850   MCH 28.1 07/06/2019 0850   MCH 26.8 06/29/2018 1835   MCHC 33.1 07/06/2019 0850   MCHC 31.6 06/29/2018 1835   RDW 14.0 07/06/2019 0850   LYMPHSABS 0.9 03/11/2015 2127   MONOABS 0.5 03/11/2015 2127   EOSABS 0.0 03/11/2015 2127   BASOSABS 0.0 03/11/2015 2127    Lab Results  Component Value Date   HGBA1C 5.8 04/07/2021   Lab Results  Component Value Date   TSH 7.160 (H) 08/21/2020      Assessment & Plan:  1. Type 2 diabetes mellitus with chronic painful diabetic neuropathy (HCC) Diet controlled with A1c of 5.8 Counseled on need for statin in primary cardiovascular prevention but she declines taking statin.  Advised to speak with cardiologist regarding possibility of PCSK9 inhibitor Counseled on Diabetic diet, my plate method, 177 minutes of moderate intensity exercise/week Blood sugar logs with fasting goals of 80-120 mg/dl, random of less than 180 and in the event of sugars less than 60 mg/dl or greater than 400 mg/dl encouraged to notify the clinic. Advised on the need for annual eye exams, annual foot exams, Pneumonia vaccine. - POCT glycosylated hemoglobin (Hb A1C) - LP+Non-HDL Cholesterol - Microalbumin / creatinine urine ratio - CMP14+EGFR  2. Mixed stress and urge urinary incontinence Uncontrolled She has not been taking her medications as prescribed Continue Kegel exercises Advised to resume oxybutynin and if symptoms persist  will consider referral to urogynecology - oxybutynin (DITROPAN) 5 MG tablet; Take 1 tablet (5 mg total) by mouth 2 (two) times daily.  Dispense: 60 tablet; Refill: 3  3. Hypothyroidism, unspecified type Uncontrolled from last TSH from 08/2020 We will send off thyroid panel and adjust regimen accordingly - T4, free - TSH  4. Neuropathic pain Stable - pregabalin (LYRICA) 100 MG capsule; Take 1 capsule (100 mg total) by mouth 2 (two) times daily.  Dispense: 60 capsule; Refill: 5  5. Dyspareunia, female Uncontrolled on Estrace - Ambulatory referral to Gynecology  6. Screening for viral disease - HCV Ab w Reflex to Quant PCR - HIV Antibody (routine testing  w rflx)  7. Hypertension associated with diabetes ([place]) Uncontrolled She attributes this to receiving bad news just before this appointment No regimen change today as last blood pressure was normal Continue current regimen Counseled on blood pressure goal of less than 130/80, low-sodium, DASH diet, medication compliance, 150 minutes of moderate intensity exercise per week. Discussed medication compliance, adverse effects.   8. Need for pneumococcal vaccine - Pneumococcal conjugate vaccine 20-valent  9. Need for immunization against influenza - Flu Vaccine QUAD 25moIM (Fluarix, Fluzone & Alfiuria Quad PF)  10. Non-seasonal allergic rhinitis due to other allergic trigger Stable - fluticasone (FLONASE) 50 MCG/ACT nasal spray; Place 2 sprays into both nostrils daily.  Dispense: 16 g; Refill: 6 - cetirizine (ZYRTEC) 10 MG tablet; Take 1 tablet (10 mg total) by mouth daily.  Dispense: 90 tablet; Refill: 1    Meds ordered this encounter  Medications   oxybutynin (DITROPAN) 5 MG tablet    Sig: Take 1 tablet (5 mg total) by mouth 2 (two) times daily.    Dispense:  60 tablet    Refill:  3   fluticasone (FLONASE) 50 MCG/ACT nasal spray    Sig: Place 2 sprays into both nostrils daily.    Dispense:  16 g    Refill:  6   cetirizine  (ZYRTEC) 10 MG tablet    Sig: Take 1 tablet (10 mg total) by mouth daily.    Dispense:  90 tablet    Refill:  1   pregabalin (LYRICA) 100 MG capsule    Sig: Take 1 capsule (100 mg total) by mouth 2 (two) times daily.    Dispense:  60 capsule    Refill:  5    49 minutes of total face to face time spent including median intraservice time reviewing previous notes and test results, counseling patient on diagnosis of urinary incontinence, dyspareunia in addition to chronic medical conditions.Time also spent ordering medications, investigations and documenting in the chart.  All questions were answered to the patient's satisfaction   Follow-up: Return in about 3 months (around 07/08/2021) for Medical conditions.       ECharlott Rakes MD, FAAFP. CRock[facility] & [facility] Centerand [facility] [place] [facility]  04/07/2021, 12:08 PM

## 2021-04-07 NOTE — Progress Notes (Signed)
Medication refills

## 2021-04-08 LAB — CMP14+EGFR
ALT: 14 IU/L (ref 0–32)
AST: 28 IU/L (ref 0–40)
Albumin/Globulin Ratio: 1.5 (ref 1.2–2.2)
Albumin: 5 g/dL — ABNORMAL HIGH (ref 3.8–4.8)
Alkaline Phosphatase: 157 IU/L — ABNORMAL HIGH (ref 44–121)
BUN/Creatinine Ratio: 8 — ABNORMAL LOW (ref 12–28)
BUN: 8 mg/dL (ref 8–27)
Bilirubin Total: 0.3 mg/dL (ref 0.0–1.2)
CO2: 20 mmol/L (ref 20–29)
Calcium: 9.8 mg/dL (ref 8.7–10.3)
Chloride: 99 mmol/L (ref 96–106)
Creatinine, Ser: 1.03 mg/dL — ABNORMAL HIGH (ref 0.57–1.00)
Globulin, Total: 3.3 g/dL (ref 1.5–4.5)
Glucose: 76 mg/dL (ref 70–99)
Potassium: 4.8 mmol/L (ref 3.5–5.2)
Sodium: 141 mmol/L (ref 134–144)
Total Protein: 8.3 g/dL (ref 6.0–8.5)
eGFR: 62 mL/min/{1.73_m2} (ref >59)

## 2021-04-08 LAB — HCV INTERPRETATION

## 2021-04-08 LAB — LP+NON-HDL CHOLESTEROL
Cholesterol, Total: 280 mg/dL — ABNORMAL HIGH (ref 100–199)
HDL: 87 mg/dL (ref >39)
LDL Chol Calc (NIH): 184 mg/dL — ABNORMAL HIGH (ref 0–99)
Total Non-HDL-Chol (LDL+VLDL): 193 mg/dL — ABNORMAL HIGH (ref 0–129)
Triglycerides: 61 mg/dL (ref 0–149)
VLDL Cholesterol Cal: 9 mg/dL (ref 5–40)

## 2021-04-08 LAB — HCV AB W REFLEX TO QUANT PCR: HCV Ab: <0.1 s/co ratio (ref 0.0–0.9)

## 2021-04-08 LAB — MICROALBUMIN / CREATININE URINE RATIO
Creatinine, Urine: 80.5 mg/dL
Microalb/Creat Ratio: 130 mg/g creat — ABNORMAL HIGH (ref 0–29)
Microalbumin, Urine: 104.7 ug/mL

## 2021-04-08 LAB — HIV ANTIBODY (ROUTINE TESTING W REFLEX): HIV Screen 4th Generation wRfx: NONREACTIVE

## 2021-04-08 LAB — T4, FREE: Free T4: 1.64 ng/dL (ref 0.82–1.77)

## 2021-04-08 LAB — TSH: TSH: 7.34 u[IU]/mL — ABNORMAL HIGH (ref 0.450–4.500)

## 2021-04-09 ENCOUNTER — Other Ambulatory Visit: Payer: Self-pay | Admitting: Family Medicine

## 2021-04-09 DIAGNOSIS — E039 Hypothyroidism, unspecified: Secondary | ICD-10-CM

## 2021-04-09 DIAGNOSIS — E1140 Type 2 diabetes mellitus with diabetic neuropathy, unspecified: Secondary | ICD-10-CM

## 2021-04-09 MED ORDER — DAPAGLIFLOZIN PROPANEDIOL 5 MG PO TABS: 5.0000 mg | ORAL_TABLET | Freq: Every day | ORAL | 3 refills | Status: DC

## 2021-04-09 MED ORDER — LEVOTHYROXINE SODIUM 137 MCG PO TABS: 137.0000 ug | ORAL_TABLET | Freq: Every day | ORAL | 1 refills | Status: DC

## 2021-04-10 ENCOUNTER — Telehealth: Payer: Self-pay

## 2021-04-10 DIAGNOSIS — E1140 Type 2 diabetes mellitus with diabetic neuropathy, unspecified: Secondary | ICD-10-CM

## 2021-04-10 MED ORDER — ACCU-CHEK GUIDE W/DEVICE KIT: 1.0000 | PACK | Freq: Three times a day (TID) | 0 refills | Status: DC

## 2021-04-10 MED ORDER — ACCU-CHEK MULTICLIX LANCETS MISC: 12 refills | Status: DC

## 2021-04-10 MED ORDER — ACCU-CHEK GUIDE VI STRP
ORAL_STRIP | 12 refills | Status: DC
Start: 2021-04-10 — End: 2023-04-07

## 2021-04-10 NOTE — Telephone Encounter (Signed)
-----   Message from Charlott Rakes, MD sent at 04/09/2021  8:45 AM EDT ----- Please inform her that her cholesterol is severely elevated which puts her at risk for cardiovascular disease and my recommendation would be for her to resume atorvastatin.  I also see another medication which is an injection prescribed by cardiology from last year that can also help with cholesterol but I am not sure if she is taking this.  A low-cholesterol diet and exercise will be helpful.  Thyroid labs are abnormal and I have sent an increased dose of levothyroxine to her pharmacy.  Her kidneys are starting to put out microalbumin which is an early sign of diabetes affecting the kidneys.  I have sent a low-dose of Farxiga to the pharmacy which will help with this.  Other labs are stable.

## 2021-04-10 NOTE — Telephone Encounter (Signed)
Patient name and DOB has been verified Patient was informed of lab results. Patient had no questions.  

## 2021-04-20 ENCOUNTER — Other Ambulatory Visit: Payer: Self-pay | Admitting: Interventional Cardiology

## 2021-04-21 ENCOUNTER — Other Ambulatory Visit: Payer: Self-pay

## 2021-04-21 ENCOUNTER — Encounter (HOSPITAL_COMMUNITY): Payer: Self-pay | Admitting: Emergency Medicine

## 2021-04-21 ENCOUNTER — Ambulatory Visit (HOSPITAL_COMMUNITY)
Admission: EM | Admit: 2021-04-21 | Discharge: 2021-04-21 | Disposition: A | Discharge status: Home / Self Care | Payer: Medicare HMO | Attending: Emergency Medicine | Admitting: Emergency Medicine

## 2021-04-21 DIAGNOSIS — M5442 Lumbago with sciatica, left side: Secondary | ICD-10-CM

## 2021-04-21 DIAGNOSIS — Z96651 Presence of right artificial knee joint: Secondary | ICD-10-CM

## 2021-04-21 DIAGNOSIS — M5441 Lumbago with sciatica, right side: Secondary | ICD-10-CM

## 2021-04-21 DIAGNOSIS — M25561 Pain in right knee: Secondary | ICD-10-CM

## 2021-04-21 DIAGNOSIS — G8929 Other chronic pain: Secondary | ICD-10-CM | POA: Diagnosis not present

## 2021-04-21 MED ORDER — KETOROLAC TROMETHAMINE 30 MG/ML IJ SOLN
30.0000 mg | Freq: Once | INTRAMUSCULAR | Status: AC
  Administered 2021-04-21: 30 mg via INTRAMUSCULAR

## 2021-04-21 MED ORDER — KETOROLAC TROMETHAMINE 30 MG/ML IJ SOLN
INTRAMUSCULAR | Status: AC
  Filled 2021-04-21: qty 1

## 2021-04-21 MED ORDER — PREDNISONE 10 MG (21) PO TBPK: ORAL_TABLET | Freq: Every day | ORAL | 0 refills | Status: DC

## 2021-04-21 NOTE — ED Triage Notes (Signed)
Pt is present today with right knee pain form a fall that occurred yesterday. Pt states that she felt a sharp pain through her back and it caused her to fall . Pt denies LOC or hitting her head.

## 2021-04-21 NOTE — ED Provider Notes (Signed)
[place]    CSN: [id number] Arrival date & time: 04/21/21  6378      History   Chief Complaint Chief Complaint  Patient presents with  . Fall  . Knee Pain    HPI Caroline Williams is a 60 y.o. female. She reports chronic low back pain that radiates to BLE that is managed by Dr. Lenord Carbo at [facility]. Dr. Davy Pique "burns" her low back nerves every 90 days to manage her nerve pain. He is out of town right now and she is not able to see him for a flare of her back pain. Yesterday her back pain caused her to fall onto her R knee. She reports chronic R knee pain after 2 R TKA, most recently R TKA in 2014.  She denies acute injury to R knee after fall and reports pain in R knee is usual chronic knee pain. She reports she periodically comes to Urgent Care for toradol and prednisone to help manage chronic pain flares. Review of records shows a visit for same last May and last February and she was given Toradol and prednisone.  Denies other injury from fall yesterday, no head injury, no LOC.   Fall  Knee Pain  Past Medical History:  Diagnosis Date  . Anxiety   . Arthritis   . Asthma   . Bipolar 1 disorder ([place])   . Breast discharge   . Breast lump   . Breast pain   . Chronic pain    on MS Contin and oxycodone  . Depression   . Diabetes mellitus    diet and exercise controlled  . Escherichia coli (E. coli) infection   . Fever   . GERD (gastroesophageal reflux disease)   . History of chest pain   . History of kidney stones   . History of knee replacement, total   . Hypertension   . Hypothyroidism   . N&V (nausea and vomiting)   . Peripheral neuropathy    Right foot no sensation  . Pneumonia    2015,2014  . Sleep apnea    does not use CPAP  . Thyroid disease   . Wears glasses     Patient Active Problem List   Diagnosis Date Noted  . Degeneration of lumbar intervertebral disc 08/07/2020  . Essential (primary) hypertension 05/07/2020   . Other chronic pain 02/14/2020  . Elevated blood-pressure reading, without diagnosis of hypertension 02/13/2020  . Hyperlipidemia 09/17/2019  . COPD mixed type ([place]) 04/17/2019  . Tobacco user 04/17/2019  . Nocturnal hypoxemia 12/25/2018  . Muscle weakness 12/05/2018  . Change in bowel habits 03/14/2018  . Chest pain 01/26/2016  . Hallucination, drug-induced ([place]) 03/12/2015  . Hypothyroidism 03/12/2015  . Chronic low back pain 01/29/2015  . Lumbar spondylosis 01/29/2015  . Lap gastric bypass June 2016 11/25/2014  . Diabetes mellitus with neuropathy ([place]) 06/20/2013  . Chronic pain of right knee 06/20/2013  . Morbid obesity ([place]) 07/05/2012    Past Surgical History:  Procedure Laterality Date  . ABDOMINAL HYSTERECTOMY     partial  . APPENDECTOMY    . BIOPSY  03/14/2018   Procedure: BIOPSY;  Surgeon: Wilford Corner, MD;  Location: [facility];  Service: Endoscopy;;  . BREAST DUCTAL SYSTEM EXCISION Right 08/31/2017   Procedure: RIGHT BREAST CENTRAL DUCT EXCISION;  Surgeon: Erroll Luna, MD;  Location: [facility];  Service: General;  Laterality: Right;  . BREAST EXCISIONAL BIOPSY Right   . BREAST LUMPECTOMY  right  . CARDIAC CATHETERIZATION     no significant CAD, nl LV function by 11/08/06 cath  . CESAREAN SECTION     x4  . COLONOSCOPY WITH PROPOFOL N/A 03/14/2018   Procedure: COLONOSCOPY WITH PROPOFOL;  Surgeon: Wilford Corner, MD;  Location: [facility];  Service: Endoscopy;  Laterality: N/A;  . GASTRIC ROUX-EN-Y N/A 11/25/2014   Procedure: LAPAROSCOPIC ROUX-EN-Y GASTRIC BYPASS WITH UPPER ENDOSCOPY;  Surgeon: Johnathan Hausen, MD;  Location: [facility];  Service: General;  Laterality: N/A;  . HERNIA REPAIR    . JOINT REPLACEMENT    . KNEE SURGERY     right  . MOUTH SURGERY    . [place]  . POLYPECTOMY  03/14/2018   Procedure: POLYPECTOMY;  Surgeon: Wilford Corner, MD;  Location: [facility];  Service: Endoscopy;;  .  TOTAL KNEE REVISION  06/21/2012   Procedure: TOTAL KNEE REVISION;  Surgeon: Newt Minion, MD;  Location: [place];  Service: Orthopedics;  Laterality: Right;  Revision Right Total Knee Arthroplasty    OB History   No obstetric history on file.      Home Medications    Prior to Admission medications   Medication Sig Start Date End Date Taking? Authorizing Provider  predniSONE (STERAPRED UNI-PAK 21 TAB) 10 MG (21) TBPK tablet Take by mouth daily. Take 6 tabs by mouth daily  for 2 days, then 5 tabs for 2 days, then 4 tabs for 2 days, then 3 tabs for 2 days, 2 tabs for 2 days, then 1 tab by mouth daily for 2 days 04/21/21  Yes Emmani Lesueur, Dionne Bucy, NP  albuterol (VENTOLIN HFA) 108 (90 Base) MCG/ACT inhaler INHALE 2 PUFFS INTO THE LUNGS EVERY 6 HOURS AS NEEDED FOR WHEEZING OR SHORTNESS OF BREATH 12/05/20   Charlott Rakes, MD  ALPRAZolam Duanne Moron) 1 MG tablet Take 1 mg by mouth 4 (four) times daily.  07/02/14   [provider]  amLODipine (NORVASC) 10 MG tablet Take 1 tablet (10 mg total) by mouth daily. 01/26/21   Belva Crome, MD  amphetamine-dextroamphetamine (ADDERALL XR) 30 MG 24 hr capsule Take 30 mg by mouth daily.  03/10/18   [provider]  amphetamine-dextroamphetamine (ADDERALL) 20 MG tablet Take 20 mg by mouth every evening. 10/13/17   [provider]  atorvastatin (LIPITOR) 20 MG tablet Take 1 tablet (20 mg total) by mouth daily. Patient not taking: Reported on 04/07/2021 08/21/20   Charlott Rakes, MD  Bempedoic Acid-Ezetimibe (NEXLIZET) 180-10 MG TABS Take 1 tablet by mouth daily. 04/16/20   Belva Crome, MD  Blood Glucose Monitoring Suppl (ACCU-CHEK GUIDE) w/Device KIT 1 kit by Does not apply route 3 (three) times daily. To check blood sugars 04/10/21   Charlott Rakes, MD  cariprazine (VRAYLAR) capsule 4.5 mg daily. 4.5 mg daily    [provider]  cetirizine (ZYRTEC) 10 MG tablet Take 1 tablet (10 mg total) by mouth daily. 04/07/21   Charlott Rakes, MD   Cyanocobalamin (VITAMIN B 12 PO) Take 1 tablet by mouth daily.     [provider]  dapagliflozin propanediol (FARXIGA) 5 MG TABS tablet Take 1 tablet (5 mg total) by mouth daily before breakfast. 04/09/21   Charlott Rakes, MD  diclofenac Sodium (VOLTAREN) 1 % GEL Apply topically. 07/30/20   [provider]  doxepin (SINEQUAN) 75 MG capsule Take 75 mg by mouth at bedtime. 03/02/18   [provider]  estradiol (ESTRACE VAGINAL) 0.1 MG/GM vaginal cream 1 g per vagina  daily Patient not taking: Reported on 04/07/2021 08/21/20   Charlott Rakes, MD  fluticasone (FLONASE) 50 MCG/ACT nasal spray Place 2 sprays into both nostrils daily. 04/07/21   Charlott Rakes, MD  glucose blood (ACCU-CHEK GUIDE) test strip Use as instructed 04/10/21   Charlott Rakes, MD  isosorbide mononitrate (IMDUR) 30 MG 24 hr tablet Take 1 tablet (30 mg total) by mouth daily. Patient not taking: Reported on 04/07/2021 12/11/20   Belva Crome, MD  Lancets (ACCU-CHEK MULTICLIX) lancets Use as instructed 04/10/21   Charlott Rakes, MD  levothyroxine (SYNTHROID) 137 MCG tablet Take 1 tablet (137 mcg total) by mouth daily before breakfast. 04/09/21   Charlott Rakes, MD  linaclotide (LINZESS) 290 MCG CAPS capsule Take 290 mcg by mouth at bedtime.    [provider]  morphine (MS CONTIN) 15 MG 12 hr tablet Take 1 tablet (15 mg total) by mouth every 12 (twelve) hours. 11/14/14   Bayard Hugger, NP  Multiple Vitamin (MULTIVITAMIN) tablet Take 1 tablet by mouth every morning.     [provider]  nabumetone (RELAFEN) 750 MG tablet Take 750 mg by mouth 2 (two) times daily. 08/08/19   [provider]  naloxone (NARCAN) nasal spray 4 mg/0.1 mL Narcan 4 mg/actuation nasal spray  USE 1 SPRAY INTRANASALLY IN 1 NOSTRIL. MAY REPEAT DOSE EVERY 2 TO 3 MINUTES AS NEEDED ALTERNATING NOSTRILS FOR OVERDOSE 05/22/20   [provider]  nitroGLYCERIN (NITROSTAT) 0.4 MG SL tablet DISSOLVE 1  TABLET UNDER THE TONGUE EVERY 5 MINUTES AS NEEDED 10/08/19   Belva Crome, MD  omeprazole (PRILOSEC) 40 MG capsule TAKE 1 CAPSULE(40 MG) BY MOUTH DAILY 01/22/20   Fulp, Cammie, MD  oxybutynin (DITROPAN) 5 MG tablet Take 1 tablet (5 mg total) by mouth 2 (two) times daily. 04/07/21   Charlott Rakes, MD  Oxycodone HCl 10 MG TABS Take 1 tablet (10 mg total) by mouth every 6 (six) hours as needed (for pain). 11/14/14   Bayard Hugger, NP  PRALUENT 150 MG/ML SOAJ INJECT 1 PEN INTO SKIN EVERY 14 DAYS. 04/20/21   Belva Crome, MD  pregabalin (LYRICA) 100 MG capsule Take 1 capsule (100 mg total) by mouth 2 (two) times daily. 04/07/21   Charlott Rakes, MD  spironolactone (ALDACTONE) 25 MG tablet Take 0.5 tablets (12.5 mg total) by mouth daily. 01/01/21   Belva Crome, MD  umeclidinium-vilanterol (ANORO ELLIPTA) 62.5-25 MCG/INH AEPB Inhale 1 puff into the lungs daily. 04/17/19   Baird Lyons D, MD  vortioxetine HBr (TRINTELLIX) 10 MG TABS tablet 10 mg daily.    [provider]  escitalopram (LEXAPRO) 20 MG tablet Take 20 mg by mouth daily.  12/14/18  [provider]  LATUDA 120 MG TABS Take 120 mg by mouth at bedtime.  03/04/18 12/14/18  [provider]  LISINOPRIL PO Take by mouth daily.    09/06/11  [provider]  metoCLOPramide (REGLAN) 10 MG tablet Take 1 tablet (10 mg total) by mouth every 8 (eight) hours as needed for nausea. Patient not taking: Reported on 12/06/2019 06/30/18 05/12/20  Mesner, Corene Cornea, MD    Family History Family History  Problem Relation Age of Onset  . Cancer Father        colon  . Stroke Father   . Heart disease Father   . Diabetes Father   . Hypertension Father   . Depression Sister   . Hypertension Mother   . Breast cancer Neg Hx  Social History Social History   Tobacco Use  . Smoking status: Former    Packs/day: 0.25    Years: 43.00    Pack years: 10.75    Types: Cigarettes    Quit date: 2020    Years since quitting: 2.8   . Smokeless tobacco: Never  Vaping Use  . Vaping Use: Never used  Substance Use Topics  . Alcohol use: Yes    Comment: social  . Drug use: No     Allergies   Aspirin, Bee venom, Sulfa antibiotics, and Other   Review of Systems Review of Systems   Physical Exam Triage Vital Signs ED Triage Vitals  Enc Vitals Group     BP 04/21/21 0845 (!) 172/112     Pulse Rate 04/21/21 0845 84     Resp 04/21/21 0845 19     Temp 04/21/21 0845 98 F (36.7 C)     Temp src --      SpO2 04/21/21 0845 97 %     Weight --      Height --      Head Circumference --      Peak Flow --      Pain Score 04/21/21 0844 10     Pain Loc --      Pain Edu? --      Excl. in [place]? --    No data found.  Updated Vital Signs BP (!) 172/112   Pulse 84   Temp 98 F (36.7 C)   Resp 19   SpO2 97%   Visual Acuity Right Eye Distance:   Left Eye Distance:   Bilateral Distance:    Right Eye Near:   Left Eye Near:    Bilateral Near:     Physical Exam Constitutional:      General: She is not in acute distress.    Appearance: Normal appearance. She is obese.  Pulmonary:     Effort: Pulmonary effort is normal.  Musculoskeletal:     Right knee: No ecchymosis or bony tenderness.     Comments: Soft tissue mild tenderness to palpation lower medial aspect of right knee.  TKA scar.  Neurological:     Mental Status: She is alert.     UC Treatments / Results  Labs (all labs ordered are listed, but only abnormal results are displayed) Labs Reviewed - No data to display  EKG   Radiology No results found.  Procedures Procedures (including critical care time)  Medications Ordered in UC Medications  ketorolac (TORADOL) 30 MG/ML injection 30 mg (has no administration in time range)    Initial Impression / Assessment and Plan / UC Course  I have reviewed the triage vital signs and the nursing notes.  Pertinent labs & imaging results that were available during my care of the patient were  reviewed by me and considered in my medical decision making (see chart for details).    Chronic knee pain and chronic low back pain with sciatica.  Patient request usual breakthrough treatments of Toradol and prednisone.  Given Toradol here in urgent care.  Prescribed prednisone taper as requested.  Final Clinical Impressions(s) / UC Diagnoses   Final diagnoses:  Chronic knee pain after total replacement of right knee joint  Chronic bilateral low back pain with bilateral sciatica     Discharge Instructions      Continue your usual pain management methods and follow up with Dr. Davy Pique when he returns.    ED Prescriptions  Medication Sig Dispense Auth. Provider   predniSONE (STERAPRED UNI-PAK 21 TAB) 10 MG (21) TBPK tablet Take by mouth daily. Take 6 tabs by mouth daily  for 2 days, then 5 tabs for 2 days, then 4 tabs for 2 days, then 3 tabs for 2 days, 2 tabs for 2 days, then 1 tab by mouth daily for 2 days 42 tablet Wendy Hoback, Dionne Bucy, NP      PDMP not reviewed this encounter.   Carvel Getting, NP 04/21/21 [id number]

## 2021-04-21 NOTE — Discharge Instructions (Signed)
Continue your usual pain management methods and follow up with Dr. Davy Pique when he returns.

## 2021-04-28 ENCOUNTER — Telehealth: Payer: Self-pay | Admitting: Pharmacist

## 2021-04-28 DIAGNOSIS — M5416 Radiculopathy, lumbar region: Secondary | ICD-10-CM | POA: Diagnosis not present

## 2021-04-28 DIAGNOSIS — Z6838 Body mass index (BMI) 38.0-38.9, adult: Secondary | ICD-10-CM | POA: Diagnosis not present

## 2021-04-28 DIAGNOSIS — I10 Essential (primary) hypertension: Secondary | ICD-10-CM | POA: Diagnosis not present

## 2021-04-28 DIAGNOSIS — G894 Chronic pain syndrome: Secondary | ICD-10-CM | POA: Diagnosis not present

## 2021-04-28 NOTE — Telephone Encounter (Signed)
Called pt as it appears she may have duplicate therapy with combination of olmesartan/amlodipine and HCTZ She was in the bath. I asked her to call me back when she could look at her medications bottles. States her BP has been a little low (97/53 this AM)  I called pt back again to see if she looked at her medications. She had only looked at the amlodipine bottle not the olmesartan bottle. She will call me when she gets back from the MD office. I left my # on her VM per her request.

## 2021-04-28 NOTE — Telephone Encounter (Signed)
I spoke with patient. She has been taking the olmesartan/amlodipine/hctz 40/5/25mg . States her Bp was low today. Will stop the amlodipine 10mg  tablet. Continue olmesartan/amlodipine/hctz 40/5/25mg  and spironolactone.

## 2021-05-18 ENCOUNTER — Ambulatory Visit: Payer: Self-pay | Admitting: *Deleted

## 2021-05-18 NOTE — Telephone Encounter (Signed)
Pt has been called and set a virtual appointment to discuss concerns.

## 2021-05-18 NOTE — Telephone Encounter (Signed)
Called pt unable to reach or leave VM at this time.

## 2021-05-18 NOTE — Telephone Encounter (Signed)
NV for 2 days and taking Pepto. Denies abdominal pain, voiding without difficulty. Able to keep down sips of fluids.Concerned about black stools. Explained was probably from the [place]. Reviewed brat diet and importance of keeping liquids down. Patient requesting appointment with concerns regarding the black stools. None available until 06/04/21. Routing to the office for possible virtual availability.       Reason for Disposition  [1] MILD or MODERATE vomiting AND [2] present > 48 hours (2 days) (Exception: mild vomiting with associated diarrhea)  Answer Assessment - Initial Assessment Questions 1. VOMITING SEVERITY: "How many times have you vomited in the past 24 hours?"     - MILD:  1 - 2 times/day    - MODERATE: 3 - 5 times/day, decreased oral intake without significant weight loss or symptoms of dehydration    - SEVERE: 6 or more times/day, vomits everything or nearly everything, with significant weight loss, symptoms of dehydration      severe 2. ONSET: "When did the vomiting begin?"      Friday night 3. FLUIDS: "What fluids or food have you vomited up today?" "Have you been able to keep any fluids down?"     Ginger ale today less than 1/2 cup. 4. ABDOMINAL PAIN: "Are your having any abdominal pain?" If yes : "How bad is it and what does it feel like?" (e.g., crampy, dull, intermittent, constant)     none  5. DIARRHEA: "Is there any diarrhea?" If Yes, ask: "How many times today?"      none 6. CONTACTS: "Is there anyone else in the family with the same symptoms?"      no 7. CAUSE: "What do you think is causing your vomiting?"     unsure 8. HYDRATION STATUS: "Any signs of dehydration?" (e.g., dry mouth [not only dry lips], too weak to stand) "When did you last urinate?"     none 9. OTHER SYMPTOMS: "Do you have any other symptoms?" (e.g., fever, headache, vertigo, vomiting blood or coffee grounds, recent head injury)     None  10. PREGNANCY: "Is there any chance you are pregnant?"  "When was your last menstrual period?"       *No Answer*na  Protocols used: Vomiting-A-AH

## 2021-05-19 DIAGNOSIS — M1711 Unilateral primary osteoarthritis, right knee: Secondary | ICD-10-CM | POA: Diagnosis not present

## 2021-05-19 DIAGNOSIS — M5416 Radiculopathy, lumbar region: Secondary | ICD-10-CM | POA: Diagnosis not present

## 2021-05-20 ENCOUNTER — Encounter: Payer: Self-pay | Admitting: Family Medicine

## 2021-05-20 ENCOUNTER — Other Ambulatory Visit: Payer: Self-pay

## 2021-05-20 ENCOUNTER — Ambulatory Visit: Payer: Medicare HMO | Attending: Family Medicine | Admitting: Family Medicine

## 2021-05-20 DIAGNOSIS — R110 Nausea: Secondary | ICD-10-CM | POA: Diagnosis not present

## 2021-05-20 DIAGNOSIS — K648 Other hemorrhoids: Secondary | ICD-10-CM | POA: Diagnosis not present

## 2021-05-20 DIAGNOSIS — K921 Melena: Secondary | ICD-10-CM

## 2021-05-20 MED ORDER — ONDANSETRON HCL 4 MG PO TABS: 4.0000 mg | ORAL_TABLET | Freq: Three times a day (TID) | ORAL | 0 refills | Status: DC | PRN

## 2021-05-20 MED ORDER — HYDROCORT-PRAMOXINE (PERIANAL) 2.5-1 % EX CREA: 1.0000 "application " | TOPICAL_CREAM | Freq: Three times a day (TID) | CUTANEOUS | 0 refills | Status: DC

## 2021-05-20 NOTE — Progress Notes (Signed)
Virtual Visit via Telephone Note  I connected with Caroline Williams, on 05/20/2021 at 8:14 AM by telephone due to the COVID-19 pandemic and verified that I am speaking with the correct person using two identifiers.   Consent: I discussed the limitations, risks, security and privacy concerns of performing an evaluation and management service by telephone and the availability of in person appointments. I also discussed with the patient that there may be a patient responsible charge related to this service. The patient expressed understanding and agreed to proceed.   Location of Patient: Home  Location of Provider: Clinic   Persons participating in Telemedicine visit: Caroline Williams Dr. Margarita Rana     History of Present Illness: Caroline Williams is a 60 y.o. year old female with a history of type 2 diabetes mellitus (diet-controlled with A1c of 5.8), hypothyroidism, bipolar disorder, hypertension.   She complains of nausea and dark stool x5 days denies alcohol intake. She has GERD and takes Omeprazole. Has no abdominal pain, abdominal bloating, excessive burping or flatulence. Last colonoscopy in 2019 revealed presence of internal hemorrhoids, 2 polyps which were resected and pathology revealed tubular adenoma, no malignancy. She endorses presence of constipation and strains sometimes. Past Medical History:  Diagnosis Date   Anxiety    Arthritis    Asthma    Bipolar 1 disorder ([place])    Breast discharge    Breast lump    Breast pain    Chronic pain    on MS Contin and oxycodone   Depression    Diabetes mellitus    diet and exercise controlled   Escherichia coli (E. coli) infection    Fever    GERD (gastroesophageal reflux disease)    History of chest pain    History of kidney stones    History of knee replacement, total    Hypertension    Hypothyroidism    N&V (nausea and vomiting)    Peripheral neuropathy    Right foot no sensation   Pneumonia    2015,2014   Sleep  apnea    does not use CPAP   Thyroid disease    Wears glasses    Allergies  Allergen Reactions   Aspirin Anaphylaxis and Other (See Comments)    Pt states she has had Toradol several times without any reactions   Bee Venom Anaphylaxis   Sulfa Antibiotics Anaphylaxis   Other Other (See Comments)    No blood products.  Patient did  Request that only albumin or albumin-containing products may be administered    Current Outpatient Medications on File Prior to Visit  Medication Sig Dispense Refill   albuterol (VENTOLIN HFA) 108 (90 Base) MCG/ACT inhaler INHALE 2 PUFFS INTO THE LUNGS EVERY 6 HOURS AS NEEDED FOR WHEEZING OR SHORTNESS OF BREATH 6.7 g 0   ALPRAZolam (XANAX) 1 MG tablet Take 1 mg by mouth 4 (four) times daily.   0   amphetamine-dextroamphetamine (ADDERALL XR) 30 MG 24 hr capsule Take 30 mg by mouth daily.      amphetamine-dextroamphetamine (ADDERALL) 20 MG tablet Take 20 mg by mouth every evening.  0   atorvastatin (LIPITOR) 20 MG tablet Take 1 tablet (20 mg total) by mouth daily. (Patient not taking: Reported on 04/07/2021) 30 tablet 3   Bempedoic Acid-Ezetimibe (NEXLIZET) 180-10 MG TABS Take 1 tablet by mouth daily. 90 tablet 3   Blood Glucose Monitoring Suppl (ACCU-CHEK GUIDE) w/Device KIT 1 kit by Does not apply route 3 (three) times daily. To  check blood sugars 1 kit 0   cariprazine (VRAYLAR) capsule 4.5 mg daily. 4.5 mg daily     cetirizine (ZYRTEC) 10 MG tablet Take 1 tablet (10 mg total) by mouth daily. 90 tablet 1   Cyanocobalamin (VITAMIN B 12 PO) Take 1 tablet by mouth daily.      dapagliflozin propanediol (FARXIGA) 5 MG TABS tablet Take 1 tablet (5 mg total) by mouth daily before breakfast. 30 tablet 3   diclofenac Sodium (VOLTAREN) 1 % GEL Apply topically.     doxepin (SINEQUAN) 75 MG capsule Take 75 mg by mouth at bedtime.  0   estradiol (ESTRACE VAGINAL) 0.1 MG/GM vaginal cream 1 g per vagina daily (Patient not taking: Reported on 04/07/2021) 42.5 g 1    fluticasone (FLONASE) 50 MCG/ACT nasal spray Place 2 sprays into both nostrils daily. 16 g 6   glucose blood (ACCU-CHEK GUIDE) test strip Use as instructed 100 each 12   isosorbide mononitrate (IMDUR) 30 MG 24 hr tablet Take 1 tablet (30 mg total) by mouth daily. (Patient not taking: Reported on 04/07/2021) 90 tablet 3   Lancets (ACCU-CHEK MULTICLIX) lancets Use as instructed 100 each 12   levothyroxine (SYNTHROID) 137 MCG tablet Take 1 tablet (137 mcg total) by mouth daily before breakfast. 90 tablet 1   linaclotide (LINZESS) 290 MCG CAPS capsule Take 290 mcg by mouth at bedtime.     morphine (MS CONTIN) 15 MG 12 hr tablet Take 1 tablet (15 mg total) by mouth every 12 (twelve) hours. 60 tablet 0   Multiple Vitamin (MULTIVITAMIN) tablet Take 1 tablet by mouth every morning.      nabumetone (RELAFEN) 750 MG tablet Take 750 mg by mouth 2 (two) times daily.     naloxone (NARCAN) nasal spray 4 mg/0.1 mL Narcan 4 mg/actuation nasal spray  USE 1 SPRAY INTRANASALLY IN 1 NOSTRIL. MAY REPEAT DOSE EVERY 2 TO 3 MINUTES AS NEEDED ALTERNATING NOSTRILS FOR OVERDOSE     nitroGLYCERIN (NITROSTAT) 0.4 MG SL tablet DISSOLVE 1 TABLET UNDER THE TONGUE EVERY 5 MINUTES AS NEEDED 25 tablet 3   Olmesartan-amLODIPine-HCTZ 40-5-25 MG TABS Take 1 tablet by mouth daily. 90 tablet 1   omeprazole (PRILOSEC) 40 MG capsule TAKE 1 CAPSULE(40 MG) BY MOUTH DAILY 90 capsule 1   oxybutynin (DITROPAN) 5 MG tablet Take 1 tablet (5 mg total) by mouth 2 (two) times daily. 60 tablet 3   Oxycodone HCl 10 MG TABS Take 1 tablet (10 mg total) by mouth every 6 (six) hours as needed (for pain). 120 tablet 0   PRALUENT 150 MG/ML SOAJ INJECT 1 PEN INTO SKIN EVERY 14 DAYS. 6 mL 3   predniSONE (STERAPRED UNI-PAK 21 TAB) 10 MG (21) TBPK tablet Take by mouth daily. Take 6 tabs by mouth daily  for 2 days, then 5 tabs for 2 days, then 4 tabs for 2 days, then 3 tabs for 2 days, 2 tabs for 2 days, then 1 tab by mouth daily for 2 days 42 tablet 0    pregabalin (LYRICA) 100 MG capsule Take 1 capsule (100 mg total) by mouth 2 (two) times daily. 60 capsule 5   spironolactone (ALDACTONE) 25 MG tablet Take 0.5 tablets (12.5 mg total) by mouth daily. 45 tablet 3   umeclidinium-vilanterol (ANORO ELLIPTA) 62.5-25 MCG/INH AEPB Inhale 1 puff into the lungs daily. 1 each 0   vortioxetine HBr (TRINTELLIX) 10 MG TABS tablet 10 mg daily.     [DISCONTINUED] escitalopram (LEXAPRO) 20 MG tablet Take 20  mg by mouth daily.     [DISCONTINUED] LATUDA 120 MG TABS Take 120 mg by mouth at bedtime.   2   [DISCONTINUED] LISINOPRIL PO Take by mouth daily.       [DISCONTINUED] metoCLOPramide (REGLAN) 10 MG tablet Take 1 tablet (10 mg total) by mouth every 8 (eight) hours as needed for nausea. (Patient not taking: Reported on 12/06/2019) 30 tablet 0   No current facility-administered medications on file prior to visit.    ROS: See HPI  Observations/Objective: Awake, alert, oriented x3 Not in acute distress Normal mood   CMP Latest Ref Rng & Units 04/07/2021 12/30/2020 12/19/2020  Glucose 70 - 99 mg/dL 76 102(H) 136(H)  BUN 8 - 27 mg/dL 8 12 11   Creatinine 0.57 - 1.00 mg/dL 1.03(H) 1.19(H) 1.39(H)  Sodium 134 - 144 mmol/L 141 137 139  Potassium 3.5 - 5.2 mmol/L 4.8 3.9 3.1(L)  Chloride 96 - 106 mmol/L 99 101 100  CO2 20 - 29 mmol/L 20 23 21   Calcium 8.7 - 10.3 mg/dL 9.8 9.0 9.7  Total Protein 6.0 - 8.5 g/dL 8.3 - -  Total Bilirubin 0.0 - 1.2 mg/dL 0.3 - -  Alkaline Phos 44 - 121 IU/L 157(H) - -  AST 0 - 40 IU/L 28 - -  ALT 0 - 32 IU/L 14 - -    Lipid Panel     Component Value Date/Time   CHOL 280 (H) 04/07/2021 1202   TRIG 61 04/07/2021 1202   HDL 87 04/07/2021 1202   CHOLHDL 1.6 07/14/2020 0754   LDLCALC 184 (H) 04/07/2021 1202   LDLDIRECT 84 04/10/2020 0759   LABVLDL 9 04/07/2021 1202    Lab Results  Component Value Date   HGBA1C 5.8 04/07/2021    Assessment and Plan: 1. Melena Could be secondary to internal hemorrhoids versus  gastroenteritis Last colonoscopy was negative for malignancy If symptoms persist will consider abdominal imaging to exclude diverticulitis or infectious colitis - CBC with Differential/Platelet; Future  2. Nausea Possible viral etiology - ondansetron (ZOFRAN) 4 MG tablet; Take 1 tablet (4 mg total) by mouth every 8 (eight) hours as needed for nausea or vomiting.  Dispense: 20 tablet; Refill: 0  3. Internal hemorrhoid Work on preventing constipation Advised to use laxatives - hydrocortisone-pramoxine (ANALPRAM HC) 2.5-1 % rectal cream; Place 1 application rectally 3 (three) times daily.  Dispense: 30 g; Refill: 0   Follow Up Instructions: Keep previously scheduled appointment.   I discussed the assessment and treatment plan with the patient. The patient was provided an opportunity to ask questions and all were answered. The patient agreed with the plan and demonstrated an understanding of the instructions.   The patient was advised to call back or seek an in-person evaluation if the symptoms worsen or if the condition fails to improve as anticipated.     I provided 11 minutes total of non-face-to-face time during this encounter.   Charlott Rakes, MD, FAAFP. [facility] [place], [place]   05/20/2021, 8:14 AM

## 2021-05-21 ENCOUNTER — Other Ambulatory Visit: Payer: Self-pay

## 2021-05-21 ENCOUNTER — Ambulatory Visit: Payer: Medicare HMO | Attending: Family Medicine

## 2021-05-21 DIAGNOSIS — K921 Melena: Secondary | ICD-10-CM | POA: Diagnosis not present

## 2021-05-22 ENCOUNTER — Telehealth: Payer: Self-pay | Admitting: Family Medicine

## 2021-05-22 DIAGNOSIS — R109 Unspecified abdominal pain: Secondary | ICD-10-CM

## 2021-05-22 LAB — CBC WITH DIFFERENTIAL/PLATELET
Basophils Absolute: 0 10*3/uL (ref 0.0–0.2)
Basos: 1 %
EOS (ABSOLUTE): 0.1 10*3/uL (ref 0.0–0.4)
Eos: 1 %
Hematocrit: 40.9 % (ref 34.0–46.6)
Hemoglobin: 13.1 g/dL (ref 11.1–15.9)
Immature Grans (Abs): 0 10*3/uL (ref 0.0–0.1)
Immature Granulocytes: 0 %
Lymphocytes Absolute: 1.2 10*3/uL (ref 0.7–3.1)
Lymphs: 20 %
MCH: 26.6 pg (ref 26.6–33.0)
MCHC: 32 g/dL (ref 31.5–35.7)
MCV: 83 fL (ref 79–97)
Monocytes Absolute: 0.1 10*3/uL (ref 0.1–0.9)
Monocytes: 2 %
Neutrophils Absolute: 4.3 10*3/uL (ref 1.4–7.0)
Neutrophils: 76 %
Platelets: 503 10*3/uL — ABNORMAL HIGH (ref 150–450)
RBC: 4.93 x10E6/uL (ref 3.77–5.28)
RDW: 13.3 % (ref 11.7–15.4)
WBC: 5.6 10*3/uL (ref 3.4–10.8)

## 2021-05-22 NOTE — Telephone Encounter (Signed)
Pt is calling to receive her lab results  CBC with Differential/Platelet [phone]

## 2021-05-25 NOTE — Telephone Encounter (Signed)
Called pt unable to reach at this time.

## 2021-05-25 NOTE — Telephone Encounter (Signed)
Pt given results per notes of Dr. Margarita Rana on 05/22/21. Pt verbalized understanding. Pt is also asking to be called back about symptoms not improving and states Dr. Margarita Rana told her she can order a stomach scan if needed. Pt is asking for the stomach scan to be ordered.. Pt states she still is unable to eat a normal meal and stools are still very loose.

## 2021-05-25 NOTE — Telephone Encounter (Signed)
Patient called in to speak to nurse regarding her lab results. Please call today at Ph# [phone]

## 2021-05-25 NOTE — Telephone Encounter (Signed)
Platelets are minimally elevated but there is no cause for alarm we can repeat this at a subsequent visit as it is very typical for it to normalize.  She is not anemic and there is no evidence of infection on her blood work.

## 2021-05-26 NOTE — Telephone Encounter (Signed)
I have ordered a CT scan please schedule this for her.  Thank you

## 2021-05-26 NOTE — Telephone Encounter (Signed)
Pt was called and informed of CT scan appointment.

## 2021-05-26 NOTE — Addendum Note (Signed)
Addended by: Charlott Rakes on: 05/26/2021 08:45 AM   Modules accepted: Orders

## 2021-05-27 ENCOUNTER — Telehealth: Payer: Self-pay | Admitting: Family Medicine

## 2021-05-27 NOTE — Telephone Encounter (Signed)
Pt has been set an CT scan to evaluate her abdominal symptoms

## 2021-05-27 NOTE — Telephone Encounter (Signed)
Copied from [id number] [phone]. Topic: Referral - Request for Referral >> May 26, 2021  3:03 PM Yvette Rack wrote: Has patient seen PCP for this complaint? No.  Pt stated she has been talking with the nurse about her stomach issues *If NO, is insurance requiring patient see PCP for this issue before PCP can refer them? Referral for which specialty: GI Preferred provider/office: No specific provider Reason for referral: stomach issues

## 2021-06-05 ENCOUNTER — Ambulatory Visit (HOSPITAL_COMMUNITY): Payer: Medicare HMO

## 2021-06-11 ENCOUNTER — Other Ambulatory Visit: Payer: Self-pay

## 2021-06-11 ENCOUNTER — Ambulatory Visit (HOSPITAL_COMMUNITY)
Admission: RE | Admit: 2021-06-11 | Discharge: 2021-06-11 | Disposition: A | Discharge status: Home / Self Care | Payer: Medicare HMO | Source: Ambulatory Visit | Attending: Family Medicine | Admitting: Family Medicine

## 2021-06-11 DIAGNOSIS — K921 Melena: Secondary | ICD-10-CM | POA: Diagnosis not present

## 2021-06-11 DIAGNOSIS — R109 Unspecified abdominal pain: Secondary | ICD-10-CM | POA: Insufficient documentation

## 2021-06-11 LAB — POCT I-STAT CREATININE: Creatinine, Ser: 1.8 mg/dL — ABNORMAL HIGH (ref 0.44–1.00)

## 2021-06-11 MED ORDER — IOHEXOL 350 MG/ML SOLN
80.0000 mL | Freq: Once | INTRAVENOUS | Status: AC | PRN
  Administered 2021-06-11: 18:00:00 60 mL via INTRAVENOUS

## 2021-06-22 DIAGNOSIS — M1711 Unilateral primary osteoarthritis, right knee: Secondary | ICD-10-CM | POA: Diagnosis not present

## 2021-06-24 ENCOUNTER — Telehealth: Payer: Self-pay

## 2021-06-24 NOTE — Telephone Encounter (Signed)
-----   Message from Charlott Rakes, MD sent at 06/15/2021  9:11 AM EST ----- Please inform her that she has no acute findings in her abdomen to explain cause of her symptoms. She has a small adrenal gland lesion and small kidney cyst which has been present chronically and interpreted by the radiologist as benign.She also has cholesterol deposits in her arteries and needs to work on a low cholesterol diet and exercise.

## 2021-06-24 NOTE — Telephone Encounter (Signed)
Patient name and DOB has been verified Patient was informed of lab results. Patient had no questions.  

## 2021-06-25 ENCOUNTER — Ambulatory Visit: Payer: Medicare HMO | Admitting: Family Medicine

## 2021-07-13 ENCOUNTER — Other Ambulatory Visit: Payer: Self-pay

## 2021-07-13 ENCOUNTER — Ambulatory Visit: Payer: Medicare HMO | Attending: Family Medicine | Admitting: Family Medicine

## 2021-07-13 VITALS — BP 121/80 | HR 87 | Ht 65.0 in | Wt 207.0 lb

## 2021-07-13 DIAGNOSIS — M25561 Pain in right knee: Secondary | ICD-10-CM | POA: Diagnosis not present

## 2021-07-13 DIAGNOSIS — E039 Hypothyroidism, unspecified: Secondary | ICD-10-CM | POA: Diagnosis not present

## 2021-07-13 DIAGNOSIS — K648 Other hemorrhoids: Secondary | ICD-10-CM

## 2021-07-13 DIAGNOSIS — F319 Bipolar disorder, unspecified: Secondary | ICD-10-CM | POA: Insufficient documentation

## 2021-07-13 DIAGNOSIS — Z6834 Body mass index (BMI) 34.0-34.9, adult: Secondary | ICD-10-CM

## 2021-07-13 DIAGNOSIS — N941 Unspecified dyspareunia: Secondary | ICD-10-CM | POA: Diagnosis not present

## 2021-07-13 DIAGNOSIS — I700 Atherosclerosis of aorta: Secondary | ICD-10-CM

## 2021-07-13 DIAGNOSIS — G8929 Other chronic pain: Secondary | ICD-10-CM

## 2021-07-13 DIAGNOSIS — E7800 Pure hypercholesterolemia, unspecified: Secondary | ICD-10-CM

## 2021-07-13 DIAGNOSIS — E1140 Type 2 diabetes mellitus with diabetic neuropathy, unspecified: Secondary | ICD-10-CM | POA: Diagnosis not present

## 2021-07-13 DIAGNOSIS — F250 Schizoaffective disorder, bipolar type: Secondary | ICD-10-CM | POA: Diagnosis not present

## 2021-07-13 LAB — POCT GLYCOSYLATED HEMOGLOBIN (HGB A1C): HbA1c, POC (controlled diabetic range): 6.8 % (ref 0.0–7.0)

## 2021-07-13 LAB — GLUCOSE, POCT (MANUAL RESULT ENTRY): POC Glucose: 102 mg/dl — AB (ref 70–99)

## 2021-07-13 MED ORDER — ZOSTER VAC RECOMB ADJUVANTED 50 MCG/0.5ML IM SUSR: 0.5000 mL | Freq: Once | INTRAMUSCULAR | 1 refills | Status: AC

## 2021-07-13 MED ORDER — HYDROCORT-PRAMOXINE (PERIANAL) 2.5-1 % EX CREA: 1.0000 "application " | TOPICAL_CREAM | Freq: Three times a day (TID) | CUTANEOUS | 2 refills | Status: DC

## 2021-07-13 NOTE — Patient Instructions (Signed)
Exercising to Stay Healthy °To become healthy and stay healthy, it is recommended that you do moderate-intensity and vigorous-intensity exercise. You can tell that you are exercising at a moderate intensity if your heart starts beating faster and you start breathing faster but can still hold a conversation. You can tell that you are exercising at a vigorous intensity if you are breathing much harder and faster and cannot hold a conversation while exercising. °How can exercise benefit me? °Exercising regularly is important. It has many health benefits, such as: °Improving overall fitness, flexibility, and endurance. °Increasing bone density. °Helping with weight control. °Decreasing body fat. °Increasing muscle strength and endurance. °Reducing stress and tension, anxiety, depression, or anger. °Improving overall health. °What guidelines should I follow while exercising? °Before you start a new exercise program, talk with your health care provider. °Do not exercise so much that you hurt yourself, feel dizzy, or get very short of breath. °Wear comfortable clothes and wear shoes with good support. °Drink plenty of water while you exercise to prevent dehydration or heat stroke. °Work out until your breathing and your heartbeat get faster (moderate intensity). °How often should I exercise? °Choose an activity that you enjoy, and set realistic goals. Your health care provider can help you make an activity plan that is individually designed and works best for you. °Exercise regularly as told by your health care provider. This may include: °Doing strength training two times a week, such as: °Lifting weights. °Using resistance bands. °Push-ups. °Sit-ups. °Yoga. °Doing a certain intensity of exercise for a given amount of time. Choose from these options: °A total of 150 minutes of moderate-intensity exercise every week. °A total of 75 minutes of vigorous-intensity exercise every week. °A mix of moderate-intensity and  vigorous-intensity exercise every week. °Children, pregnant women, people who have not exercised regularly, people who are overweight, and older adults may need to talk with a health care provider about what activities are safe to perform. If you have a medical condition, be sure to talk with your health care provider before you start a new exercise program. °What are some exercise ideas? °Moderate-intensity exercise ideas include: °Walking 1 mile (1.6 km) in about 15 minutes. °Biking. °Hiking. °Golfing. °Dancing. °Water aerobics. °Vigorous-intensity exercise ideas include: °Walking 4.5 miles (7.2 km) or more in about 1 hour. °Jogging or running 5 miles (8 km) in about 1 hour. °Biking 10 miles (16.1 km) or more in about 1 hour. °Lap swimming. °Roller-skating or in-line skating. °Cross-country skiing. °Vigorous competitive sports, such as football, basketball, and soccer. °Jumping rope. °Aerobic dancing. °What are some everyday activities that can help me get exercise? °Yard work, such as: °Pushing a lawn mower. °Raking and bagging leaves. °Washing your car. °Pushing a stroller. °Shoveling snow. °Gardening. °Washing windows or floors. °How can I be more active in my day-to-day activities? °Use stairs instead of an elevator. °Take a walk during your lunch break. °If you drive, park your car farther away from your work or school. °If you take public transportation, get off one stop early and walk the rest of the way. °Stand up or walk around during all of your indoor phone calls. °Get up, stretch, and walk around every 30 minutes throughout the day. °Enjoy exercise with a friend. Support to continue exercising will help you keep a regular routine of activity. °Where to find more information °You can find more information about exercising to stay healthy from: °[place] Department of Health and Human Services: www.hhs.gov °Centers for Disease Control and Prevention (  CDC): www.cdc.gov °Summary °Exercising regularly is  important. It will improve your overall fitness, flexibility, and endurance. °Regular exercise will also improve your overall health. It can help you control your weight, reduce stress, and improve your bone density. °Do not exercise so much that you hurt yourself, feel dizzy, or get very short of breath. °Before you start a new exercise program, talk with your health care provider. °This information is not intended to replace advice given to you by your health care provider. Make sure you discuss any questions you have with your health care provider. °Document Revised: 09/26/2020 Document Reviewed: 09/26/2020 °Elsevier Patient Education © 2022 [employer] ° °

## 2021-07-13 NOTE — Progress Notes (Signed)
Subjective:  Patient ID: Caroline Williams, female    DOB: 08/27/1960  Age: 61 y.o. MRN: [id number]  CC: Diabetes   HPI Caroline Williams is a 61 y.o. year old female with a history of type 2 diabetes mellitus (diet-controlled with A1c of 6.8), hypothyroidism, bipolar disorder, hypertension. She presents today to discuss fatigue CT abdomen pelvis result which was done after she had complained of abdominal pain which has since resolved.  Her CT abdomen and pelvis had revealed: IMPRESSION: No acute findings within the abdomen or pelvis.   Stable small benign right adrenal adenoma.   Aortic Atherosclerosis (ICD10-I70.0).     Interval History: She continues to have dyspareunia and vaginal . OTC lubricants have been ineffective and Estrace which I prescribed previously has been ineffective as well.  She would like a referral to GYN. She is followed by [facility] and she also sees a Retail banker as well.  She states she does not take Lipitor anymore even though it appears on her med list.  She is also on Praluent and bempedoic acid which is prescribed by cardiology.  I do not see any notes indicating Lipitor was previously discontinued.  Pharmacy notes indicate she probably was unable to tolerate a statin Today she request refill of and no problem for her hemorrhoids.  Her A1c is 6.8 which is up from 5.8 previously and she endorses overindulging during the holidays but states she has been compliant with [place].  Denies hypoglycemia, abnormal vision, numbness in extremities.  She does not exercise much.  She would like a referral to nutrition. Thyroid panel was abnormal in 03/2021 and she endorses compliance with her levothyroxine. She is on Linzess for opioid induced constipation prescribed her Pain Dr - Dr Davy Pique.  She is on chronic opioid therapy for management of back pain and osteoarthritis of the knees.  Past Medical History:  Diagnosis Date   Anxiety    Arthritis     Asthma    Bipolar 1 disorder ([place])    Breast discharge    Breast lump    Breast pain    Chronic pain    on MS Contin and oxycodone   Depression    Diabetes mellitus    diet and exercise controlled   Escherichia coli (E. coli) infection    Fever    GERD (gastroesophageal reflux disease)    History of chest pain    History of kidney stones    History of knee replacement, total    Hypertension    Hypothyroidism    N&V (nausea and vomiting)    Peripheral neuropathy    Right foot no sensation   Pneumonia    2015,2014   Sleep apnea    does not use CPAP   Thyroid disease    Wears glasses     Past Surgical History:  Procedure Laterality Date   ABDOMINAL HYSTERECTOMY     partial   APPENDECTOMY     BIOPSY  03/14/2018   Procedure: BIOPSY;  Surgeon: Wilford Corner, MD;  Location: [facility];  Service: Endoscopy;;   BREAST DUCTAL SYSTEM EXCISION Right 08/31/2017   Procedure: RIGHT BREAST CENTRAL DUCT EXCISION;  Surgeon: Erroll Luna, MD;  Location: [place];  Service: General;  Laterality: Right;   BREAST EXCISIONAL BIOPSY Right    BREAST LUMPECTOMY     right   CARDIAC CATHETERIZATION     no significant CAD, nl LV function by 11/08/06 cath   CESAREAN SECTION  x4   COLONOSCOPY WITH PROPOFOL N/A 03/14/2018   Procedure: COLONOSCOPY WITH PROPOFOL;  Surgeon: Wilford Corner, MD;  Location: [facility];  Service: Endoscopy;  Laterality: N/A;   GASTRIC ROUX-EN-Y N/A 11/25/2014   Procedure: LAPAROSCOPIC ROUX-EN-Y GASTRIC BYPASS WITH UPPER ENDOSCOPY;  Surgeon: Johnathan Hausen, MD;  Location: [facility];  Service: General;  Laterality: N/A;   HERNIA REPAIR     JOINT REPLACEMENT     KNEE SURGERY     right   [place]   POLYPECTOMY  03/14/2018   Procedure: POLYPECTOMY;  Surgeon: Wilford Corner, MD;  Location: [facility];  Service: Endoscopy;;   TOTAL KNEE REVISION  06/21/2012   Procedure: TOTAL KNEE REVISION;   Surgeon: Newt Minion, MD;  Location: [place];  Service: Orthopedics;  Laterality: Right;  Revision Right Total Knee Arthroplasty    Family History  Problem Relation Age of Onset   Cancer Father        colon   Stroke Father    Heart disease Father    Diabetes Father    Hypertension Father    Depression Sister    Hypertension Mother    Breast cancer Neg Hx     Allergies  Allergen Reactions   Aspirin Anaphylaxis and Other (See Comments)    Pt states she has had Toradol several times without any reactions   Bee Venom Anaphylaxis   Sulfa Antibiotics Anaphylaxis   Other Other (See Comments)    No blood products.  Patient did  Request that only albumin or albumin-containing products may be administered   Repatha [Evolocumab]     Outpatient Medications Prior to Visit  Medication Sig Dispense Refill   albuterol (VENTOLIN HFA) 108 (90 Base) MCG/ACT inhaler INHALE 2 PUFFS INTO THE LUNGS EVERY 6 HOURS AS NEEDED FOR WHEEZING OR SHORTNESS OF BREATH 6.7 g 0   ALPRAZolam (XANAX) 1 MG tablet Take 1 mg by mouth 4 (four) times daily.   0   amphetamine-dextroamphetamine (ADDERALL XR) 30 MG 24 hr capsule Take 30 mg by mouth daily.      amphetamine-dextroamphetamine (ADDERALL) 20 MG tablet Take 20 mg by mouth every evening.  0   atorvastatin (LIPITOR) 20 MG tablet Take 1 tablet (20 mg total) by mouth daily. 30 tablet 3   Bempedoic Acid-Ezetimibe (NEXLIZET) 180-10 MG TABS Take 1 tablet by mouth daily. 90 tablet 3   Blood Glucose Monitoring Suppl (ACCU-CHEK GUIDE) w/Device KIT 1 kit by Does not apply route 3 (three) times daily. To check blood sugars 1 kit 0   cariprazine (VRAYLAR) capsule 4.5 mg daily. 4.5 mg daily     cetirizine (ZYRTEC) 10 MG tablet Take 1 tablet (10 mg total) by mouth daily. 90 tablet 1   Cyanocobalamin (VITAMIN B 12 PO) Take 1 tablet by mouth daily.      dapagliflozin propanediol (FARXIGA) 5 MG TABS tablet Take 1 tablet (5 mg total) by mouth daily before breakfast. 30 tablet 3    diclofenac Sodium (VOLTAREN) 1 % GEL Apply topically.     doxepin (SINEQUAN) 75 MG capsule Take 75 mg by mouth at bedtime.  0   estradiol (ESTRACE VAGINAL) 0.1 MG/GM vaginal cream 1 g per vagina daily 42.5 g 1   fluticasone (FLONASE) 50 MCG/ACT nasal spray Place 2 sprays into both nostrils daily. 16 g 6   glucose blood (ACCU-CHEK GUIDE) test strip Use as instructed 100 each 12   isosorbide mononitrate (IMDUR) 30 MG 24  hr tablet Take 1 tablet (30 mg total) by mouth daily. 90 tablet 3   Lancets (ACCU-CHEK MULTICLIX) lancets Use as instructed 100 each 12   levothyroxine (SYNTHROID) 137 MCG tablet Take 1 tablet (137 mcg total) by mouth daily before breakfast. 90 tablet 1   linaclotide (LINZESS) 290 MCG CAPS capsule Take 290 mcg by mouth at bedtime.     morphine (MS CONTIN) 15 MG 12 hr tablet Take 1 tablet (15 mg total) by mouth every 12 (twelve) hours. 60 tablet 0   Multiple Vitamin (MULTIVITAMIN) tablet Take 1 tablet by mouth every morning.      nabumetone (RELAFEN) 750 MG tablet Take 750 mg by mouth 2 (two) times daily.     naloxone (NARCAN) nasal spray 4 mg/0.1 mL Narcan 4 mg/actuation nasal spray  USE 1 SPRAY INTRANASALLY IN 1 NOSTRIL. MAY REPEAT DOSE EVERY 2 TO 3 MINUTES AS NEEDED ALTERNATING NOSTRILS FOR OVERDOSE     nitroGLYCERIN (NITROSTAT) 0.4 MG SL tablet DISSOLVE 1 TABLET UNDER THE TONGUE EVERY 5 MINUTES AS NEEDED 25 tablet 3   Olmesartan-amLODIPine-HCTZ 40-5-25 MG TABS Take 1 tablet by mouth daily. 90 tablet 1   omeprazole (PRILOSEC) 40 MG capsule TAKE 1 CAPSULE(40 MG) BY MOUTH DAILY 90 capsule 1   oxybutynin (DITROPAN) 5 MG tablet Take 1 tablet (5 mg total) by mouth 2 (two) times daily. 60 tablet 3   Oxycodone HCl 10 MG TABS Take 1 tablet (10 mg total) by mouth every 6 (six) hours as needed (for pain). 120 tablet 0   PRALUENT 150 MG/ML SOAJ INJECT 1 PEN INTO SKIN EVERY 14 DAYS. 6 mL 3   spironolactone (ALDACTONE) 25 MG tablet Take 0.5 tablets (12.5 mg total) by mouth daily. 45 tablet 3    umeclidinium-vilanterol (ANORO ELLIPTA) 62.5-25 MCG/INH AEPB Inhale 1 puff into the lungs daily. 1 each 0   vortioxetine HBr (TRINTELLIX) 10 MG TABS tablet 10 mg daily.     hydrocortisone-pramoxine (ANALPRAM HC) 2.5-1 % rectal cream Place 1 application rectally 3 (three) times daily. 30 g 0   ondansetron (ZOFRAN) 4 MG tablet Take 1 tablet (4 mg total) by mouth every 8 (eight) hours as needed for nausea or vomiting. 20 tablet 0   predniSONE (STERAPRED UNI-PAK 21 TAB) 10 MG (21) TBPK tablet Take by mouth daily. Take 6 tabs by mouth daily  for 2 days, then 5 tabs for 2 days, then 4 tabs for 2 days, then 3 tabs for 2 days, 2 tabs for 2 days, then 1 tab by mouth daily for 2 days 42 tablet 0   pregabalin (LYRICA) 100 MG capsule Take 1 capsule (100 mg total) by mouth 2 (two) times daily. 60 capsule 5   No facility-administered medications prior to visit.     ROS Review of Systems  Constitutional:  Negative for activity change, appetite change and fatigue.  HENT:  Negative for congestion, sinus pressure and sore throat.   Eyes:  Negative for visual disturbance.  Respiratory:  Negative for cough, chest tightness, shortness of breath and wheezing.   Cardiovascular:  Negative for chest pain and palpitations.  Gastrointestinal:  Negative for abdominal distention, abdominal pain and constipation.  Endocrine: Negative for polydipsia.  Genitourinary:  Negative for dysuria and frequency.  Musculoskeletal:  Positive for arthralgias and back pain.  Skin:  Negative for rash.  Neurological:  Negative for tremors, light-headedness and numbness.  Hematological:  Does not bruise/bleed easily.  Psychiatric/Behavioral:  Negative for agitation and behavioral problems.    Objective:  BP 121/80    Pulse 87    Ht 5' 5"  (1.651 m)    Wt 207 lb (93.9 kg)    SpO2 98%    BMI 34.45 kg/m   BP/Weight 07/13/2021 04/21/2021 22/07/5425  Systolic BP 062 376 283  Diastolic BP 80 151 761  Wt. (Lbs) 207 - 219.2  BMI 34.45 -  36.48      Physical Exam Constitutional:      Appearance: She is well-developed. She is obese.  Cardiovascular:     Rate and Rhythm: Normal rate.     Heart sounds: Normal heart sounds. No murmur heard. Pulmonary:     Effort: Pulmonary effort is normal.     Breath sounds: Normal breath sounds. No wheezing or rales.  Chest:     Chest wall: No tenderness.  Abdominal:     General: Bowel sounds are normal. There is no distension.     Palpations: Abdomen is soft. There is no mass.     Tenderness: There is no abdominal tenderness.  Musculoskeletal:        General: Normal range of motion.     Right lower leg: No edema.     Left lower leg: No edema.  Neurological:     Mental Status: She is alert and oriented to person, place, and time.  Psychiatric:        Mood and Affect: Mood normal.    CMP Latest Ref Rng & Units 06/11/2021 04/07/2021 12/30/2020  Glucose 70 - 99 mg/dL - 76 102(H)  BUN 8 - 27 mg/dL - 8 12  Creatinine 0.44 - 1.00 mg/dL 1.80(H) 1.03(H) 1.19(H)  Sodium 134 - 144 mmol/L - 141 137  Potassium 3.5 - 5.2 mmol/L - 4.8 3.9  Chloride 96 - 106 mmol/L - 99 101  CO2 20 - 29 mmol/L - 20 23  Calcium 8.7 - 10.3 mg/dL - 9.8 9.0  Total Protein 6.0 - 8.5 g/dL - 8.3 -  Total Bilirubin 0.0 - 1.2 mg/dL - 0.3 -  Alkaline Phos 44 - 121 IU/L - 157(H) -  AST 0 - 40 IU/L - 28 -  ALT 0 - 32 IU/L - 14 -    Lipid Panel     Component Value Date/Time   CHOL 280 (H) 04/07/2021 1202   TRIG 61 04/07/2021 1202   HDL 87 04/07/2021 1202   CHOLHDL 1.6 07/14/2020 0754   LDLCALC 184 (H) 04/07/2021 1202   LDLDIRECT 84 04/10/2020 0759    CBC    Component Value Date/Time   WBC 5.6 05/21/2021 0846   WBC 5.0 06/29/2018 1835   RBC 4.93 05/21/2021 0846   RBC 5.68 (H) 06/29/2018 1835   HGB 13.1 05/21/2021 0846   HCT 40.9 05/21/2021 0846   PLT 503 (H) 05/21/2021 0846   MCV 83 05/21/2021 0846   MCH 26.6 05/21/2021 0846   MCH 26.8 06/29/2018 1835   MCHC 32.0 05/21/2021 0846   MCHC 31.6  06/29/2018 1835   RDW 13.3 05/21/2021 0846   LYMPHSABS 1.2 05/21/2021 0846   MONOABS 0.5 03/11/2015 2127   EOSABS 0.1 05/21/2021 0846   BASOSABS 0.0 05/21/2021 0846    Lab Results  Component Value Date   HGBA1C 6.8 07/13/2021    Lab Results  Component Value Date   TSH 7.340 (H) 04/07/2021    Assessment & Plan:  1. Type 2 diabetes mellitus with chronic painful diabetic neuropathy (HCC) Controlled with A1c of 6.8; goal is less than 7.0 Will refer to nutrition as  A1c has trended up from 5.8-6.8.  If this trends up further consider increasing dose of Farxiga Counseled on Diabetic diet, my plate method, 263 minutes of moderate intensity exercise/week Blood sugar logs with fasting goals of 80-120 mg/dl, random of less than 180 and in the event of sugars less than 60 mg/dl or greater than 400 mg/dl encouraged to notify the clinic. Advised on the need for annual eye exams, annual foot exams, Pneumonia vaccine. - POCT glucose (manual entry) - POCT glycosylated hemoglobin (Hb A1C) - Amb ref to Medical Nutrition Therapy-MNT  2. Internal hemorrhoid - hydrocortisone-pramoxine (ANALPRAM HC) 2.5-1 % rectal cream; Place 1 application rectally 3 (three) times daily.  Dispense: 30 g; Refill: 2  3. Pure hypercholesterolemia Unable to tolerate statin Continue Repatha and bempedoic acid Low-cholesterol diet  4. Hypothyroidism, unspecified type Uncontrolled Will check thyroid panel and adjust regimen accordingly - T4, free - TSH  5. Dyspareunia, female Uncontrolled on Estrace and OTC lubricants - Ambulatory referral to Gynecology  6. Chronic pain of right knee Currently on chronic opioids from pain management  7. Schizoaffective disorder, bipolar type ([place]) Stable Management as per psych  8. Morbid obesity ([place]) - Amb ref to Medical Nutrition Therapy-MNT  9. Atherosclerosis of aorta ([place]) See #3 above   Meds ordered this encounter  Medications   Zoster Vaccine Adjuvanted  [facility]) injection    Sig: Inject 0.5 mLs into the muscle once for 1 dose.    Dispense:  0.5 mL    Refill:  1   hydrocortisone-pramoxine (ANALPRAM HC) 2.5-1 % rectal cream    Sig: Place 1 application rectally 3 (three) times daily.    Dispense:  30 g    Refill:  2    Follow-up: Return in about 6 months (around 01/10/2022) for Chronic medical conditions.       Charlott Rakes, MD, FAAFP. [facility] [place], [place]   07/13/2021, 1:40 PM

## 2021-07-13 NOTE — Progress Notes (Signed)
Discuss recent scans. Medication refills.

## 2021-07-14 ENCOUNTER — Other Ambulatory Visit: Payer: Self-pay | Admitting: Family Medicine

## 2021-07-14 DIAGNOSIS — E039 Hypothyroidism, unspecified: Secondary | ICD-10-CM

## 2021-07-14 LAB — T4, FREE: Free T4: 0.2 ng/dL — ABNORMAL LOW (ref 0.82–1.77)

## 2021-07-14 LAB — TSH: TSH: 81.1 u[IU]/mL — ABNORMAL HIGH (ref 0.450–4.500)

## 2021-07-14 MED ORDER — LEVOTHYROXINE SODIUM 150 MCG PO TABS: 150.0000 ug | ORAL_TABLET | Freq: Every day | ORAL | 1 refills | Status: DC

## 2021-07-16 ENCOUNTER — Telehealth: Payer: Self-pay

## 2021-07-16 NOTE — Telephone Encounter (Signed)
-----   Message from Charlott Rakes, MD sent at 07/14/2021  3:13 PM EST ----- Please inform her that her thyroid test is abnormal.  Please encourage to take levothyroxine first thing in the morning on an empty stomach.  I have increased the dose of levothyroxine and sent this to the pharmacy.  Repeat thyroid panel is recommended in 6 weeks I have placed order for this.  Thank you.

## 2021-07-16 NOTE — Telephone Encounter (Signed)
Patient name and DOB has been verified Patient was informed of lab results. Patient had no questions.  

## 2021-07-22 DIAGNOSIS — M1711 Unilateral primary osteoarthritis, right knee: Secondary | ICD-10-CM | POA: Diagnosis not present

## 2021-07-22 DIAGNOSIS — G894 Chronic pain syndrome: Secondary | ICD-10-CM | POA: Diagnosis not present

## 2021-07-22 DIAGNOSIS — Z6838 Body mass index (BMI) 38.0-38.9, adult: Secondary | ICD-10-CM | POA: Diagnosis not present

## 2021-07-22 DIAGNOSIS — M5416 Radiculopathy, lumbar region: Secondary | ICD-10-CM | POA: Diagnosis not present

## 2021-07-24 DIAGNOSIS — F250 Schizoaffective disorder, bipolar type: Secondary | ICD-10-CM | POA: Diagnosis not present

## 2021-07-31 ENCOUNTER — Other Ambulatory Visit: Payer: Self-pay | Admitting: Family Medicine

## 2021-07-31 DIAGNOSIS — N3946 Mixed incontinence: Secondary | ICD-10-CM

## 2021-08-01 ENCOUNTER — Other Ambulatory Visit: Payer: Self-pay

## 2021-08-01 ENCOUNTER — Encounter (HOSPITAL_COMMUNITY): Payer: Self-pay | Admitting: Emergency Medicine

## 2021-08-01 ENCOUNTER — Ambulatory Visit (HOSPITAL_COMMUNITY)
Admission: EM | Admit: 2021-08-01 | Discharge: 2021-08-01 | Disposition: A | Discharge status: Home / Self Care | Payer: Medicare HMO | Attending: Emergency Medicine | Admitting: Emergency Medicine

## 2021-08-01 DIAGNOSIS — M5430 Sciatica, unspecified side: Secondary | ICD-10-CM | POA: Diagnosis not present

## 2021-08-01 DIAGNOSIS — M549 Dorsalgia, unspecified: Secondary | ICD-10-CM

## 2021-08-01 DIAGNOSIS — W19XXXA Unspecified fall, initial encounter: Secondary | ICD-10-CM | POA: Diagnosis not present

## 2021-08-01 HISTORY — DX: Sciatica, unspecified side: M54.30

## 2021-08-01 MED ORDER — KETOROLAC TROMETHAMINE 30 MG/ML IJ SOLN
30.0000 mg | Freq: Once | INTRAMUSCULAR | Status: AC
  Administered 2021-08-01: 30 mg via INTRAMUSCULAR

## 2021-08-01 MED ORDER — METHYLPREDNISOLONE SODIUM SUCC 125 MG IJ SOLR
60.0000 mg | Freq: Once | INTRAMUSCULAR | Status: AC
  Administered 2021-08-01: 60 mg via INTRAMUSCULAR

## 2021-08-01 NOTE — Discharge Instructions (Addendum)
Rest, ice and heat as needed Ensure adequate ROM as tolerated. Solu-Medrol IM and Toradol IM were given in office  Follow-up with PCP and neurology Return here or go to ER if you have any new or worsening symptoms such as numbness/tingling of the inner thighs, loss of bladder or bowel control, headache/blurry vision, nausea/vomiting, confusion/altered mental status, dizziness, weakness, passing out, imbalance, etc..Marland Kitchen

## 2021-08-01 NOTE — ED Provider Notes (Addendum)
Caroline Williams   [id number] 08/01/21 Arrival Time: 43   Chief Complaint  Patient presents with   Back Pain   Fall     SUBJECTIVE: History from: patient and family.  Caroline Williams is a 61 y.o. female with history of bilateral sciatica presented to the urgent care with a complaint of lower back pain with bilateral sciatica involvement..  Developed a flare after falling recently.  She localized the pain to her lower back and lower legs.  She describes the pain as constant and achy.  She has tried OTC medications without relief.  Her symptoms are made worse with ROM.  She reports similar symptoms in the past and has an appointment with neurology in a few days.  She denies any alleviating factors.  She denies chills, fever, nausea, vomiting, diarrhea.   ROS: As per HPI.  All other pertinent ROS negative.      Past Medical History:  Diagnosis Date   Anxiety    Arthritis    Asthma    Bipolar 1 disorder ([place])    Breast discharge    Breast lump    Breast pain    Chronic pain    on MS Contin and oxycodone   Depression    Diabetes mellitus    diet and exercise controlled   Escherichia coli (E. coli) infection    Fever    GERD (gastroesophageal reflux disease)    History of chest pain    History of kidney stones    History of knee replacement, total    Hypertension    Hypothyroidism    N&V (nausea and vomiting)    Peripheral neuropathy    Right foot no sensation   Pneumonia    2015,2014   Sciatica    Sleep apnea    does not use CPAP   Thyroid disease    Wears glasses    Past Surgical History:  Procedure Laterality Date   ABDOMINAL HYSTERECTOMY     partial   APPENDECTOMY     BIOPSY  03/14/2018   Procedure: BIOPSY;  Surgeon: Wilford Corner, MD;  Location: [facility];  Service: Endoscopy;;   BREAST DUCTAL SYSTEM EXCISION Right 08/31/2017   Procedure: RIGHT BREAST CENTRAL DUCT EXCISION;  Surgeon: Erroll Luna, MD;  Location: [facility];   Service: General;  Laterality: Right;   BREAST EXCISIONAL BIOPSY Right    BREAST LUMPECTOMY     right   CARDIAC CATHETERIZATION     no significant CAD, nl LV function by 11/08/06 cath   CESAREAN SECTION     x4   COLONOSCOPY WITH PROPOFOL N/A 03/14/2018   Procedure: COLONOSCOPY WITH PROPOFOL;  Surgeon: Wilford Corner, MD;  Location: [facility];  Service: Endoscopy;  Laterality: N/A;   GASTRIC ROUX-EN-Y N/A 11/25/2014   Procedure: LAPAROSCOPIC ROUX-EN-Y GASTRIC BYPASS WITH UPPER ENDOSCOPY;  Surgeon: Johnathan Hausen, MD;  Location: [facility];  Service: General;  Laterality: N/A;   HERNIA REPAIR     JOINT REPLACEMENT     KNEE SURGERY     right   [place]   POLYPECTOMY  03/14/2018   Procedure: POLYPECTOMY;  Surgeon: Wilford Corner, MD;  Location: [facility];  Service: Endoscopy;;   TOTAL KNEE REVISION  06/21/2012   Procedure: TOTAL KNEE REVISION;  Surgeon: Newt Minion, MD;  Location: [facility];  Service: Orthopedics;  Laterality: Right;  Revision Right Total Knee Arthroplasty   Allergies  Allergen Reactions  Aspirin Anaphylaxis and Other (See Comments)    Pt states she has had Toradol several times without any reactions   Bee Venom Anaphylaxis   Sulfa Antibiotics Anaphylaxis   Other Other (See Comments)    No blood products.  Patient did  Request that only albumin or albumin-containing products may be administered   Repatha [Evolocumab]    No current facility-administered medications on file prior to encounter.   Current Outpatient Medications on File Prior to Encounter  Medication Sig Dispense Refill   albuterol (VENTOLIN HFA) 108 (90 Base) MCG/ACT inhaler INHALE 2 PUFFS INTO THE LUNGS EVERY 6 HOURS AS NEEDED FOR WHEEZING OR SHORTNESS OF BREATH 6.7 g 0   ALPRAZolam (XANAX) 1 MG tablet Take 1 mg by mouth 4 (four) times daily.   0   amphetamine-dextroamphetamine (ADDERALL XR) 30 MG 24 hr capsule Take 30 mg by mouth daily.       amphetamine-dextroamphetamine (ADDERALL) 20 MG tablet Take 20 mg by mouth every evening.  0   Bempedoic Acid-Ezetimibe (NEXLIZET) 180-10 MG TABS Take 1 tablet by mouth daily. 90 tablet 3   cariprazine (VRAYLAR) capsule 4.5 mg daily. 4.5 mg daily     cetirizine (ZYRTEC) 10 MG tablet Take 1 tablet (10 mg total) by mouth daily. 90 tablet 1   Cyanocobalamin (VITAMIN B 12 PO) Take 1 tablet by mouth daily.      dapagliflozin propanediol (FARXIGA) 5 MG TABS tablet Take 1 tablet (5 mg total) by mouth daily before breakfast. 30 tablet 3   doxepin (SINEQUAN) 75 MG capsule Take 75 mg by mouth at bedtime.  0   fluticasone (FLONASE) 50 MCG/ACT nasal spray Place 2 sprays into both nostrils daily. 16 g 6   isosorbide mononitrate (IMDUR) 30 MG 24 hr tablet Take 1 tablet (30 mg total) by mouth daily. 90 tablet 3   levothyroxine (SYNTHROID) 150 MCG tablet Take 1 tablet (150 mcg total) by mouth daily before breakfast. 90 tablet 1   linaclotide (LINZESS) 290 MCG CAPS capsule Take 290 mcg by mouth at bedtime.     morphine (MS CONTIN) 15 MG 12 hr tablet Take 1 tablet (15 mg total) by mouth every 12 (twelve) hours. 60 tablet 0   Multiple Vitamin (MULTIVITAMIN) tablet Take 1 tablet by mouth every morning.      nabumetone (RELAFEN) 750 MG tablet Take 750 mg by mouth 2 (two) times daily.     Olmesartan-amLODIPine-HCTZ 40-5-25 MG TABS Take 1 tablet by mouth daily. 90 tablet 1   omeprazole (PRILOSEC) 40 MG capsule TAKE 1 CAPSULE(40 MG) BY MOUTH DAILY 90 capsule 1   oxybutynin (DITROPAN) 5 MG tablet TAKE 1 TABLET(5 MG) BY MOUTH TWICE DAILY 60 tablet 3   Oxycodone HCl 10 MG TABS Take 1 tablet (10 mg total) by mouth every 6 (six) hours as needed (for pain). 120 tablet 0   PRALUENT 150 MG/ML SOAJ INJECT 1 PEN INTO SKIN EVERY 14 DAYS. 6 mL 3   spironolactone (ALDACTONE) 25 MG tablet Take 0.5 tablets (12.5 mg total) by mouth daily. 45 tablet 3   vortioxetine HBr (TRINTELLIX) 10 MG TABS tablet 10 mg daily.     Blood Glucose  Monitoring Suppl (ACCU-CHEK GUIDE) w/Device KIT 1 kit by Does not apply route 3 (three) times daily. To check blood sugars 1 kit 0   diclofenac Sodium (VOLTAREN) 1 % GEL Apply topically.     estradiol (ESTRACE VAGINAL) 0.1 MG/GM vaginal cream 1 g per vagina daily 42.5 g 1   glucose  blood (ACCU-CHEK GUIDE) test strip Use as instructed 100 each 12   hydrocortisone-pramoxine (ANALPRAM HC) 2.5-1 % rectal cream Place 1 application rectally 3 (three) times daily. 30 g 2   Lancets (ACCU-CHEK MULTICLIX) lancets Use as instructed 100 each 12   naloxone (NARCAN) nasal spray 4 mg/0.1 mL Narcan 4 mg/actuation nasal spray  USE 1 SPRAY INTRANASALLY IN 1 NOSTRIL. MAY REPEAT DOSE EVERY 2 TO 3 MINUTES AS NEEDED ALTERNATING NOSTRILS FOR OVERDOSE     nitroGLYCERIN (NITROSTAT) 0.4 MG SL tablet DISSOLVE 1 TABLET UNDER THE TONGUE EVERY 5 MINUTES AS NEEDED 25 tablet 3   umeclidinium-vilanterol (ANORO ELLIPTA) 62.5-25 MCG/INH AEPB Inhale 1 puff into the lungs daily. 1 each 0   [DISCONTINUED] escitalopram (LEXAPRO) 20 MG tablet Take 20 mg by mouth daily.     [DISCONTINUED] LATUDA 120 MG TABS Take 120 mg by mouth at bedtime.   2   [DISCONTINUED] LISINOPRIL PO Take by mouth daily.       [DISCONTINUED] metoCLOPramide (REGLAN) 10 MG tablet Take 1 tablet (10 mg total) by mouth every 8 (eight) hours as needed for nausea. (Patient not taking: Reported on 12/06/2019) 30 tablet 0   Social History   Socioeconomic History   Marital status: Single    Spouse name: Not on file   Number of children: Not on file   Years of education: Not on file   Highest education level: Not on file  Occupational History   Not on file  Tobacco Use   Smoking status: Former    Packs/day: 0.25    Years: 43.00    Pack years: 10.75    Types: Cigarettes    Quit date: 2020    Years since quitting: 3.1   Smokeless tobacco: Never  Vaping Use   Vaping Use: Never used  Substance and Sexual Activity   Alcohol use: Yes    Comment: social   Drug  use: No   Sexual activity: Yes    Birth control/protection: Surgical  Other Topics Concern   Not on file  Social History Narrative   Not on file   Social Determinants of Health   Financial Resource Strain: Medium Risk   Difficulty of Paying Living Expenses: Somewhat hard  Food Insecurity: No Food Insecurity   Worried About Charity fundraiser in the Last Year: Never true   Ran Out of Food in the Last Year: Never true  Transportation Needs: No Transportation Needs   Lack of Transportation (Medical): No   Lack of Transportation (Non-Medical): No  Physical Activity: Inactive   Days of Exercise per Week: 0 days   Minutes of Exercise per Session: 0 min  Stress: No Stress Concern Present   Feeling of Stress : Only a little  Social Connections: Moderately Integrated   Frequency of Communication with Friends and Family: More than three times a week   Frequency of Social Gatherings with Friends and Family: Never   Attends Religious Services: 1 to 4 times per year   Active Member of [employer] or Organizations: No   Attends Archivist Meetings: Never   Marital Status: Living with partner  Intimate Partner Violence: Not At Risk   Fear of Current or Ex-Partner: No   Emotionally Abused: No   Physically Abused: No   Sexually Abused: No   Family History  Problem Relation Age of Onset   Cancer Father        colon   Stroke Father    Heart disease Father  Diabetes Father    Hypertension Father    Depression Sister    Hypertension Mother    Breast cancer Neg Hx     OBJECTIVE:  Vitals:   08/01/21 1054  BP: 132/81  Pulse: 82  Resp: 20  Temp: 98.1 F (36.7 C)  TempSrc: Oral  SpO2: 95%     Physical Exam Vitals and nursing note reviewed.  Constitutional:      General: She is not in acute distress.    Appearance: Normal appearance. She is normal weight. She is not ill-appearing, toxic-appearing or diaphoretic.  HENT:     Head: Normocephalic.  Cardiovascular:      Rate and Rhythm: Normal rate and regular rhythm.     Pulses: Normal pulses.     Heart sounds: Normal heart sounds. No murmur heard.   No friction rub. No gallop.  Pulmonary:     Effort: Pulmonary effort is normal. No respiratory distress.     Breath sounds: Normal breath sounds. No stridor. No wheezing, rhonchi or rales.  Chest:     Chest wall: No tenderness.  Musculoskeletal:     Cervical back: Normal.     Thoracic back: Normal.     Lumbar back: Tenderness present. Positive right straight leg raise test and positive left straight leg raise test.  Neurological:     Mental Status: She is alert and oriented to person, place, and time.     LABS:  No results found for this or any previous visit (from the past 24 hour(s)).   ASSESSMENT & PLAN:  1. Back pain with sciatica   2. Fall, initial encounter     Meds ordered this encounter  Medications   ketorolac (TORADOL) 30 MG/ML injection 30 mg   methylPREDNISolone sodium succinate (SOLU-MEDROL) 125 mg/2 mL injection 60 mg    Discharge instructions  Rest, ice and heat as needed Ensure adequate ROM as tolerated. Solu-Medrol IM and Toradol IM were given in office  Follow-up with PCP and neurology Return here or go to ER if you have any new or worsening symptoms such as numbness/tingling of the inner thighs, loss of bladder or bowel control, headache/blurry vision, nausea/vomiting, confusion/altered mental status, dizziness, weakness, passing out, imbalance, etc...     Reviewed expectations re: course of current medical issues. Questions answered. Outlined signs and symptoms indicating need for more acute intervention. Patient verbalized understanding. After Visit Summary given.          Emerson Monte, FNP 08/01/21 1227    Emerson Monte, FNP 08/01/21 1229

## 2021-08-01 NOTE — ED Triage Notes (Signed)
Patient c/o bilateral lower back pain that radiated to hips and fall x 3 days ago.   Patient endorses fall happen " because I tried over my dog".   Patient denies hitting head or LOC.   History of Sciatica. Patient is following neurosurgery for sciatica per patient statement.   Patient has taken oxycodone and morphine with no relief of symptoms.

## 2021-08-04 ENCOUNTER — Encounter: Payer: Medicare HMO | Attending: Family Medicine | Admitting: Skilled Nursing Facility1

## 2021-08-04 ENCOUNTER — Other Ambulatory Visit: Payer: Self-pay

## 2021-08-04 DIAGNOSIS — E119 Type 2 diabetes mellitus without complications: Secondary | ICD-10-CM | POA: Insufficient documentation

## 2021-08-04 NOTE — Progress Notes (Signed)
Bariatric Nutrition Follow-Up Visit Medical Nutrition Therapy   NUTRITION ASSESSMENT   [facility]  Anthropometrics    Body Composition Scale 10/13/2020 12/09/2020 08/04/2021  Weight  lbs 231.6 221.6 212.8  Total Body Fat  % 44.5 43.4 42.4     Visceral Fat 16 15 14   Fat-Free Mass  % 55.4 56.5 57.5     Total Body Water  % 42.2 42.7 43.2     Muscle-Mass  lbs 30.3 30.2 30  BMI 38.2 36.5 35.1  Body Fat Displacement ---          Torso  lbs 63.8 59.5 55.9        Left Leg  lbs 12.7 11.9 11.1        Right Leg  lbs 12.7 11.9 11.1        Left Arm  lbs 6.3 5.9 5.5        Right Arm  lbs 6.3 5.9 5.5   Clinical  Medical hx: DM, kidney stone Medications:  Labs: A1C 6.1, creatinine 1.03, BUN/creatinine 8, albumin 5.0, alkaline phosphatase 157   Lifestyle & Dietary Hx   Pt states getting closer to her goal of 190 pounds has been helping to motivate her. Pt states her boyfriend is very supportive.  Pt states she has not been feeling as depressed lately.  Pt states the [place] for her depression has really worked because she feels so much better now.  Pt states she is still smoking but is still trying to stop.  Pt states if she is good about drinking her water she will not have any bowel issues.  Pt states she is thinking she will start doing some activity when it gets warmer.  Pt states her long term goal is to be 175 pounds.  Pt states she does fruit for dessert.  Pt states every 90 days she gets her nerves burned in he rback for severe pain.   Estimated daily fluid intake: unknown  oz Estimated daily protein intake: 60+ g Supplements: B12, centrum silver,calcium, potassium (occassionaly), biotin. Bariatric multivitamin (unknown which one) Current average weekly physical activity: ADl's  24-Hr Dietary Recall: one big meal a day First Meal: smoothie: banana + muscle milk sometimes strawberries + banana on the side and pistachios Snack:  Second Meal: grilled cheese Snack:  Third Meal: roast +  carrots + onions + potatoes + roll Snack:  Beverages: water, soda, unsweet tea, juice, hot tea + stevia, hot tea  Post-Op Goals/ Signs/ Symptoms Using straws: yes Drinking while eating: yes Chewing/swallowing difficulties: no Changes in vision: no Changes to mood/headaches: yes: mood changes stating it is due to menopause  Hair loss/changes to skin/nails: no Difficulty focusing/concentrating: yes: ADHD Sweating: menopause  Dizziness/lightheadedness: no Palpitations: no Carbonated/caffeinated beverages: no N/V/D/C/Gas: no longer having constipation  Abdominal pain: no Dumping syndrome: no    NUTRITION DIAGNOSIS  Overweight/obesity ([place]-3.3) related to past poor dietary habits and physical inactivity as evidenced by completed bariatric surgery and following dietary guidelines for continued weight loss and healthy nutrition status.     NUTRITION INTERVENTION continued Nutrition counseling (C-1) and education (E-2) to facilitate bariatric surgery goals, including: The importance of consuming adequate calories as well as certain nutrients daily due to the body's need for essential vitamins, minerals, and fats The importance of daily physical activity and to reach a goal of at least 150 minutes of moderate to vigorous physical activity weekly (or as directed by their physician) due to benefits such as increased musculature and improved lab values  The importance of intuitive eating specifically learning hunger-satiety cues and understanding the importance of learning a new body: The importance of mindful eating to avoid grazing behaviors  Encouraged patient to honor their body's internal hunger and fullness cues.  Throughout the day, check in mentally and rate hunger. Stop eating when satisfied not full regardless of how much food is left on the plate.  Get more if still hungry 20-30 minutes later.  The key is to honor satisfaction so throughout the meal, rate fullness factor and stop when  comfortably satisfied not physically full. The key is to honor hunger and fullness without any feelings of guilt or shame.  Pay attention to what the internal cues are, rather than any external factors. This will enhance the confidence you have in listening to your own body and following those internal cues enabling you to increase how often you eat when you are hungry not out of appetite and stop when you are satisfied not full.  Encouraged pt to continue to eat balanced meals inclusive of non starchy vegetables 2 times a day 7 days a week Encouraged pt to choose lean protein sources: limiting beef, pork, sausage, hotdogs, and lunch meat Encourage pt to choose healthy fats such as plant based limiting animal fats Encouraged pt to continue to drink a minium 64 fluid ounces with half being plain water to satisfy proper hydration  Handouts Previously Provided Include  Multi and calcium   Learning Style & Readiness for Change Teaching method utilized: Visual & Auditory  Demonstrated degree of understanding via: Teach Back  Readiness Level: Action Barriers to learning/adherence to lifestyle change:   RD's Notes for Next Visit Assess adherence to pt chosen goals   MONITORING & EVALUATION Dietary intake, weekly physical activity, body weight  Next Steps Patient is to follow-up as needed

## 2021-08-20 DIAGNOSIS — M5416 Radiculopathy, lumbar region: Secondary | ICD-10-CM | POA: Diagnosis not present

## 2021-09-01 ENCOUNTER — Other Ambulatory Visit: Payer: Self-pay

## 2021-09-01 ENCOUNTER — Encounter: Payer: Medicare HMO | Attending: Family Medicine | Admitting: Skilled Nursing Facility1

## 2021-09-01 DIAGNOSIS — E1140 Type 2 diabetes mellitus with diabetic neuropathy, unspecified: Secondary | ICD-10-CM | POA: Insufficient documentation

## 2021-09-01 NOTE — Progress Notes (Signed)
Bariatric Nutrition Follow-Up Visit ?Medical Nutrition Therapy  ? ?NUTRITION ASSESSMENT ?  ?[facility] ? ?Anthropometrics  ? ? ?Body Composition Scale 10/13/2020 12/09/2020 08/04/2021 09/01/2021  ?Weight  lbs 231.6 221.6 212.8 208.3  ?Total Body Fat  % 44.5 43.4 42.4 41.8  ?   Visceral Fat '16 15 14 14  '$ ?Fat-Free Mass  % 55.4 56.5 57.5 58.1  ?   Total Body Water  % 42.2 42.7 43.2 43.5  ?   Muscle-Mass  lbs 30.3 30.2 30 30.0  ?BMI 38.2 36.5 35.1 34.3  ?Body Fat Displacement ---     ?      Torso  lbs 63.8 59.5 55.9 53.9  ?      Left Leg  lbs 12.7 11.9 11.1 10.7  ?      Right Leg  lbs 12.7 11.9 11.1 10.7  ?      Left Arm  lbs 6.3 5.9 5.5 5.3  ?      Right Arm  lbs 6.3 5.9 5.5 5.3  ? ?Clinical  ?Medical hx: DM, kidney stone ?Medications:  ?Labs: A1C 6.1, creatinine 1.03, BUN/creatinine 8, albumin 5.0, alkaline phosphatase 157 ?TSH 81.100 ? ?  ?Lifestyle & Dietary Hx ?Pt states when she gets depressed, she doesn't want to take her medications, but doing better since [place].  Now taking all of her medications ?Pt states energy level is low, stating she thinks it is her medications ?Checking blood sugar (100 to 135) ?Pt states her BP is high (family thing) ?Pt states she still can't eat a lot, forcing herself to eat... not eating all of her food. ?Pt states no one told her she should eat every 3 hours ?Pt states she does feel like she has been successful... thought she would already be at her goal wt. ? ? ?Estimated daily fluid intake: unknown  oz ?Estimated daily protein intake: 60+ g ?Supplements: B12, centrum silver,calcium, potassium (occassionaly), biotin. Bariatric multivitamin (unknown which one) ?Current average weekly physical activity: ADl's ? ?24-Hr Dietary Recall: one big meal a day ?First Meal: smoothie (rasp or blueberries, frozen fruit, mango), green tea w/ sugar (2 spoons)... sometimes another smoothie (herbal life smoothie) ?Snack:  ?Second Meal: granny smith apple w/ peanut butter ?Snack:  ?Third Meal: rotisserie  chicken, stuffing, broccoli and cheese ?Snack:  ?Beverages: water (4-5 bottles), ocean spray cranberry juice 16 oz ? ?Post-Op Goals/ Signs/ Symptoms ?Using straws: yes ?Drinking while eating: yes ?Chewing/swallowing difficulties: no ?Changes in vision: no ?Changes to mood/headaches: yes: mood changes stating it is due to menopause  ?Hair loss/changes to skin/nails: no ?Difficulty focusing/concentrating: yes: ADHD ?Sweating: menopause  ?Dizziness/lightheadedness: no ?Palpitations: no ?Carbonated/caffeinated beverages: no ?N/V/D/C/Gas: no longer having constipation  ?Abdominal pain: no ?Dumping syndrome: no ? ?  ?NUTRITION DIAGNOSIS  ?Overweight/obesity ([place]-3.3) related to past poor dietary habits and physical inactivity as evidenced by completed bariatric surgery and following dietary guidelines for continued weight loss and healthy nutrition status. ?  ?  ?NUTRITION INTERVENTION continued ?Nutrition counseling (C-1) and education (E-2) to facilitate bariatric surgery goals, including: ?The importance of consuming adequate calories as well as certain nutrients daily due to the body's need for essential vitamins, minerals, and fats ?The importance of daily physical activity and to reach a goal of at least 150 minutes of moderate to vigorous physical activity weekly (or as directed by their physician) due to benefits such as increased musculature and improved lab values ?The importance of intuitive eating specifically learning hunger-satiety cues and understanding the importance of learning a  new body: The importance of mindful eating to avoid grazing behaviors  ?Encouraged patient to honor their body's internal hunger and fullness cues.  Throughout the day, check in mentally and rate hunger. Stop eating when satisfied not full regardless of how much food is left on the plate.  Get more if still hungry 20-30 minutes later.  The key is to honor satisfaction so throughout the meal, rate fullness factor and stop when  comfortably satisfied not physically full. The key is to honor hunger and fullness without any feelings of guilt or shame.  Pay attention to what the internal cues are, rather than any external factors. This will enhance the confidence you have in listening to your own body and following those internal cues enabling you to increase how often you eat when you are hungry not out of appetite and stop when you are satisfied not full.  ?Encouraged pt to continue to eat balanced meals inclusive of non starchy vegetables 2 times a day 7 days a week ?Encouraged pt to choose lean protein sources: limiting beef, pork, sausage, hotdogs, and lunch meat ?Encourage pt to choose healthy fats such as plant based limiting animal fats ?Encouraged pt to continue to drink a minium 64 fluid ounces with half being plain water to satisfy proper hydration ? ?Goals: ?Cut portion sizes in half and incorporate snacks ?Less cheese as flavorings for food ?Arm chair workouts (gave Arm Chair Workout handout) ? ?Handouts Previously Provided Include  ?Multi and calcium  ? ?Learning Style & Readiness for Change ?Teaching method utilized: Visual & Auditory  ?Demonstrated degree of understanding via: Teach Back  ?Readiness Level: Action ?Barriers to learning/adherence to lifestyle change:  ? ?RD's Notes for Next Visit ?Assess adherence to pt chosen goals ? ? ?MONITORING & EVALUATION ?Dietary intake, weekly physical activity, body weight ? ?Next Steps ?Patient is to follow-up 1 month ? ?

## 2021-09-09 ENCOUNTER — Other Ambulatory Visit: Payer: Self-pay

## 2021-09-09 ENCOUNTER — Emergency Department (HOSPITAL_COMMUNITY): Payer: Medicare HMO

## 2021-09-09 ENCOUNTER — Emergency Department (HOSPITAL_COMMUNITY)
Admission: EM | Admit: 2021-09-09 | Discharge: 2021-09-10 | Disposition: A | Discharge status: Home / Self Care | Payer: Medicare HMO | Attending: Emergency Medicine | Admitting: Emergency Medicine

## 2021-09-09 DIAGNOSIS — S0990XA Unspecified injury of head, initial encounter: Secondary | ICD-10-CM | POA: Insufficient documentation

## 2021-09-09 DIAGNOSIS — T07XXXA Unspecified multiple injuries, initial encounter: Secondary | ICD-10-CM | POA: Diagnosis not present

## 2021-09-09 DIAGNOSIS — E119 Type 2 diabetes mellitus without complications: Secondary | ICD-10-CM | POA: Insufficient documentation

## 2021-09-09 DIAGNOSIS — Z6838 Body mass index (BMI) 38.0-38.9, adult: Secondary | ICD-10-CM | POA: Diagnosis not present

## 2021-09-09 DIAGNOSIS — K6389 Other specified diseases of intestine: Secondary | ICD-10-CM | POA: Diagnosis not present

## 2021-09-09 DIAGNOSIS — J449 Chronic obstructive pulmonary disease, unspecified: Secondary | ICD-10-CM | POA: Insufficient documentation

## 2021-09-09 DIAGNOSIS — M25462 Effusion, left knee: Secondary | ICD-10-CM | POA: Diagnosis not present

## 2021-09-09 DIAGNOSIS — Y92410 Unspecified street and highway as the place of occurrence of the external cause: Secondary | ICD-10-CM | POA: Diagnosis not present

## 2021-09-09 DIAGNOSIS — Z23 Encounter for immunization: Secondary | ICD-10-CM | POA: Insufficient documentation

## 2021-09-09 DIAGNOSIS — M1711 Unilateral primary osteoarthritis, right knee: Secondary | ICD-10-CM | POA: Diagnosis not present

## 2021-09-09 DIAGNOSIS — K449 Diaphragmatic hernia without obstruction or gangrene: Secondary | ICD-10-CM | POA: Diagnosis not present

## 2021-09-09 DIAGNOSIS — N281 Cyst of kidney, acquired: Secondary | ICD-10-CM | POA: Diagnosis not present

## 2021-09-09 DIAGNOSIS — Z041 Encounter for examination and observation following transport accident: Secondary | ICD-10-CM | POA: Diagnosis not present

## 2021-09-09 DIAGNOSIS — M5416 Radiculopathy, lumbar region: Secondary | ICD-10-CM | POA: Diagnosis not present

## 2021-09-09 DIAGNOSIS — R0789 Other chest pain: Secondary | ICD-10-CM | POA: Diagnosis not present

## 2021-09-09 DIAGNOSIS — G894 Chronic pain syndrome: Secondary | ICD-10-CM | POA: Diagnosis not present

## 2021-09-09 DIAGNOSIS — S3991XA Unspecified injury of abdomen, initial encounter: Secondary | ICD-10-CM | POA: Insufficient documentation

## 2021-09-09 DIAGNOSIS — S299XXA Unspecified injury of thorax, initial encounter: Secondary | ICD-10-CM | POA: Insufficient documentation

## 2021-09-09 DIAGNOSIS — R519 Headache, unspecified: Secondary | ICD-10-CM | POA: Diagnosis not present

## 2021-09-09 DIAGNOSIS — M47816 Spondylosis without myelopathy or radiculopathy, lumbar region: Secondary | ICD-10-CM | POA: Diagnosis not present

## 2021-09-09 DIAGNOSIS — S8992XA Unspecified injury of left lower leg, initial encounter: Secondary | ICD-10-CM | POA: Insufficient documentation

## 2021-09-09 DIAGNOSIS — S199XXA Unspecified injury of neck, initial encounter: Secondary | ICD-10-CM | POA: Diagnosis not present

## 2021-09-09 DIAGNOSIS — Z7984 Long term (current) use of oral hypoglycemic drugs: Secondary | ICD-10-CM | POA: Insufficient documentation

## 2021-09-09 DIAGNOSIS — I2510 Atherosclerotic heart disease of native coronary artery without angina pectoris: Secondary | ICD-10-CM | POA: Diagnosis not present

## 2021-09-09 DIAGNOSIS — J439 Emphysema, unspecified: Secondary | ICD-10-CM | POA: Diagnosis not present

## 2021-09-09 DIAGNOSIS — M542 Cervicalgia: Secondary | ICD-10-CM | POA: Diagnosis not present

## 2021-09-09 DIAGNOSIS — M25562 Pain in left knee: Secondary | ICD-10-CM | POA: Diagnosis not present

## 2021-09-09 DIAGNOSIS — M47812 Spondylosis without myelopathy or radiculopathy, cervical region: Secondary | ICD-10-CM | POA: Diagnosis not present

## 2021-09-09 DIAGNOSIS — M545 Low back pain, unspecified: Secondary | ICD-10-CM | POA: Diagnosis not present

## 2021-09-09 DIAGNOSIS — M47814 Spondylosis without myelopathy or radiculopathy, thoracic region: Secondary | ICD-10-CM | POA: Diagnosis not present

## 2021-09-09 DIAGNOSIS — Z79899 Other long term (current) drug therapy: Secondary | ICD-10-CM | POA: Diagnosis not present

## 2021-09-09 LAB — I-STAT CHEM 8, ED
BUN: 31 mg/dL — ABNORMAL HIGH (ref 8–23)
Calcium, Ion: 1.17 mmol/L (ref 1.15–1.40)
Chloride: 104 mmol/L (ref 98–111)
Creatinine, Ser: 1.5 mg/dL — ABNORMAL HIGH (ref 0.44–1.00)
Glucose, Bld: 140 mg/dL — ABNORMAL HIGH (ref 70–99)
HCT: 39 % (ref 36.0–46.0)
Hemoglobin: 13.3 g/dL (ref 12.0–15.0)
Potassium: 3.7 mmol/L (ref 3.5–5.1)
Sodium: 136 mmol/L (ref 135–145)
TCO2: 23 mmol/L (ref 22–32)

## 2021-09-09 MED ORDER — SODIUM CHLORIDE 0.9 % IV BOLUS
1000.0000 mL | Freq: Once | INTRAVENOUS | Status: AC
Start: 2021-09-10 — End: 2021-09-10
  Administered 2021-09-09: 1000 mL via INTRAVENOUS

## 2021-09-09 MED ORDER — MORPHINE SULFATE (PF) 4 MG/ML IV SOLN
6.0000 mg | Freq: Once | INTRAVENOUS | Status: AC
Start: 2021-09-09 — End: 2021-09-09
  Administered 2021-09-09: 6 mg via INTRAVENOUS

## 2021-09-09 MED ORDER — TETANUS-DIPHTH-ACELL PERTUSSIS 5-2.5-18.5 LF-MCG/0.5 IM SUSY
0.5000 mL | PREFILLED_SYRINGE | Freq: Once | INTRAMUSCULAR | Status: AC
  Administered 2021-09-09: 0.5 mL via INTRAMUSCULAR

## 2021-09-09 MED ORDER — ACETAMINOPHEN 500 MG PO TABS
1000.0000 mg | ORAL_TABLET | Freq: Once | ORAL | Status: AC
  Administered 2021-09-09: 1000 mg via ORAL
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
## 2021-09-10 ENCOUNTER — Emergency Department (HOSPITAL_COMMUNITY): Payer: Medicare HMO

## 2021-09-10 DIAGNOSIS — K449 Diaphragmatic hernia without obstruction or gangrene: Secondary | ICD-10-CM | POA: Diagnosis not present

## 2021-09-10 DIAGNOSIS — S299XXA Unspecified injury of thorax, initial encounter: Secondary | ICD-10-CM | POA: Diagnosis not present

## 2021-09-10 DIAGNOSIS — M47816 Spondylosis without myelopathy or radiculopathy, lumbar region: Secondary | ICD-10-CM | POA: Diagnosis not present

## 2021-09-10 DIAGNOSIS — Z041 Encounter for examination and observation following transport accident: Secondary | ICD-10-CM | POA: Diagnosis not present

## 2021-09-10 DIAGNOSIS — M47814 Spondylosis without myelopathy or radiculopathy, thoracic region: Secondary | ICD-10-CM | POA: Diagnosis not present

## 2021-09-10 DIAGNOSIS — S0990XA Unspecified injury of head, initial encounter: Secondary | ICD-10-CM | POA: Diagnosis not present

## 2021-09-10 DIAGNOSIS — N281 Cyst of kidney, acquired: Secondary | ICD-10-CM | POA: Diagnosis not present

## 2021-09-10 DIAGNOSIS — I2510 Atherosclerotic heart disease of native coronary artery without angina pectoris: Secondary | ICD-10-CM | POA: Diagnosis not present

## 2021-09-10 DIAGNOSIS — J439 Emphysema, unspecified: Secondary | ICD-10-CM | POA: Diagnosis not present

## 2021-09-10 DIAGNOSIS — K6389 Other specified diseases of intestine: Secondary | ICD-10-CM | POA: Diagnosis not present

## 2021-09-10 DIAGNOSIS — S3991XA Unspecified injury of abdomen, initial encounter: Secondary | ICD-10-CM | POA: Diagnosis not present

## 2021-09-10 MED ORDER — OXYCODONE HCL 5 MG PO TABS
5.0000 mg | ORAL_TABLET | Freq: Once | ORAL | Status: AC
  Administered 2021-09-10: 5 mg via ORAL

## 2021-09-10 MED ORDER — SODIUM CHLORIDE 0.9 % IV BOLUS
1000.0000 mL | Freq: Once | INTRAVENOUS | Status: AC
  Administered 2021-09-10: 1000 mL via INTRAVENOUS

## 2021-09-10 MED ORDER — METHOCARBAMOL 500 MG PO TABS: 500.0000 mg | ORAL_TABLET | Freq: Three times a day (TID) | ORAL | 0 refills | Status: DC | PRN

## 2021-09-10 MED ORDER — IOHEXOL 300 MG/ML  SOLN
80.0000 mL | Freq: Once | INTRAMUSCULAR | Status: AC | PRN
  Administered 2021-09-10: 80 mL via INTRAVENOUS
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
## 2021-09-22 ENCOUNTER — Other Ambulatory Visit: Payer: Self-pay | Admitting: Family Medicine

## 2021-09-23 ENCOUNTER — Other Ambulatory Visit: Payer: Self-pay | Admitting: Family Medicine

## 2021-09-23 NOTE — Telephone Encounter (Signed)
Requested Prescriptions  ?Pending Prescriptions Disp Refills  ?? albuterol (VENTOLIN HFA) 108 (90 Base) MCG/ACT inhaler [Pharmacy Med Name: ALBUTEROL HFA INH (200 PUFFS) 6.7GM] 6.7 g 0  ?  Sig: INHALE 2 PUFFS INTO THE LUNGS EVERY 6 HOURS AS NEEDED FOR WHEEZING OR SHORTNESS OF BREATH  ?  ? Pulmonology:  Beta Agonists 2 Passed - 09/22/2021 12:56 PM  ?  ?  Passed - Last BP in normal range  ?  BP Readings from Last 1 Encounters:  ?09/10/21 103/64  ?   ?  ?  Passed - Last Heart Rate in normal range  ?  Pulse Readings from Last 1 Encounters:  ?09/10/21 66  ?   ?  ?  Passed - Valid encounter within last 12 months  ?  Recent Outpatient Visits   ?      ? 2 months ago Type 2 diabetes mellitus with chronic painful diabetic neuropathy ([place])  ? [place], MD  ? 4 months ago [place]  ? [place], Charlane Ferretti, MD  ? 5 months ago Type 2 diabetes mellitus with chronic painful diabetic neuropathy ([place])  ? [facility] [place], Charlane Ferretti, MD  ? 1 year ago Type 2 diabetes mellitus with chronic painful diabetic neuropathy ([place])  ? [place], Charlane Ferretti, MD  ? 1 year ago Urinary incontinence, unspecified type  ? [place], [place], NP  ?  ?  ?Future Appointments   ?        ? In 3 months Charlott Rakes, MD [place]  ?  ? ?  ?  ?  ? ?

## 2021-09-24 ENCOUNTER — Telehealth: Payer: Self-pay | Admitting: Family Medicine

## 2021-09-24 NOTE — Telephone Encounter (Signed)
Called patient to confirm appt for 10/01/21 ?

## 2021-09-29 ENCOUNTER — Telehealth: Payer: Self-pay | Admitting: Obstetrics & Gynecology

## 2021-09-29 ENCOUNTER — Ambulatory Visit: Payer: Medicare HMO | Admitting: Skilled Nursing Facility1

## 2021-09-29 NOTE — Telephone Encounter (Signed)
Called patient to inform her of appointment change, I was unable to leave a message.  New Appointment 11-19-21 at 8:35 am.  ?

## 2021-10-01 ENCOUNTER — Encounter: Payer: Medicare HMO | Admitting: Obstetrics & Gynecology

## 2021-10-10 ENCOUNTER — Other Ambulatory Visit: Payer: Self-pay | Admitting: Family Medicine

## 2021-10-10 DIAGNOSIS — J3089 Other allergic rhinitis: Secondary | ICD-10-CM

## 2021-10-12 NOTE — Telephone Encounter (Signed)
Requested Prescriptions  ?Pending Prescriptions Disp Refills  ?? fluticasone (FLONASE) 50 MCG/ACT nasal spray [Pharmacy Med Name: FLUTICASONE 50MCG NASAL SP (120) RX] 16 g 2  ?  Sig: SHAKE LIQUID AND USE 2 SPRAYS IN EACH NOSTRIL DAILY  ?  ? Not Delegated - Ear, Nose, and Throat: Nasal Preparations - Corticosteroids Failed - 10/10/2021  8:08 AM  ?  ?  Failed - This refill cannot be delegated  ?  ?  Passed - Valid encounter within last 12 months  ?  Recent Outpatient Visits   ?      ? 3 months ago Type 2 diabetes mellitus with chronic painful diabetic neuropathy ([place])  ? [place], MD  ? 4 months ago [place]  ? [place], Charlane Ferretti, MD  ? 6 months ago Type 2 diabetes mellitus with chronic painful diabetic neuropathy ([place])  ? [facility] [place], Charlane Ferretti, MD  ? 1 year ago Type 2 diabetes mellitus with chronic painful diabetic neuropathy ([place])  ? [place], Charlane Ferretti, MD  ? 1 year ago Urinary incontinence, unspecified type  ? [place], [place], NP  ?  ?  ?Future Appointments   ?        ? In 3 months Charlott Rakes, MD [place]  ?  ? ?  ?  ?  ? ?

## 2021-10-14 DIAGNOSIS — F250 Schizoaffective disorder, bipolar type: Secondary | ICD-10-CM | POA: Diagnosis not present

## 2021-10-14 DIAGNOSIS — F4001 Agoraphobia with panic disorder: Secondary | ICD-10-CM | POA: Diagnosis not present

## 2021-10-26 ENCOUNTER — Ambulatory Visit: Payer: Medicare HMO | Admitting: Skilled Nursing Facility1

## 2021-11-17 ENCOUNTER — Ambulatory Visit: Payer: Medicare HMO | Attending: Nurse Practitioner | Admitting: Nurse Practitioner

## 2021-11-17 ENCOUNTER — Ambulatory Visit: Payer: Self-pay

## 2021-11-17 ENCOUNTER — Encounter: Payer: Self-pay | Admitting: Nurse Practitioner

## 2021-11-17 DIAGNOSIS — N3946 Mixed incontinence: Secondary | ICD-10-CM | POA: Diagnosis not present

## 2021-11-17 DIAGNOSIS — N611 Abscess of the breast and nipple: Secondary | ICD-10-CM | POA: Diagnosis not present

## 2021-11-17 MED ORDER — DOXYCYCLINE HYCLATE 100 MG PO TABS: 100.0000 mg | ORAL_TABLET | Freq: Two times a day (BID) | ORAL | 0 refills | Status: AC

## 2021-11-17 NOTE — Telephone Encounter (Signed)
  Chief Complaint: breast lump Symptoms: R breast lump in quarter size, painful 8/10, redness and oozing.  Frequency: since last Wednesday Pertinent Negatives: NA Disposition: '[]'$ ED /'[]'$ Urgent Care (no appt availability in office) / '[x]'$ Appointment(In office/virtual)/ '[]'$  [facility]/ '[]'$ Home Care/ '[]'$ Refused Recommended Disposition /'[]'$ [facility]/ '[]'$  Follow-up with PCP Additional Notes: Pt has had this happen before about 7 times. She called the breast center but they told her she needs a referral again so scheduled her for a VV with Zelda, NP today. She states it hurts so bad she is unable to wear a bra right now. Pt also needing assistance from where she ordered incontinent supplies from [employer] and they are requiring her to have an appt with PCP. I advised her to speak with Zelda about this during appt for today.   Reason for Disposition  Breast lump  Answer Assessment - Initial Assessment Questions 1. SYMPTOM: "What's the main symptom you're concerned about?"  (e.g., lump, pain, rash, nipple discharge)     Lump  2. LOCATION: "Where is the sx located?"     R breast 3. ONSET: "When did sx  start?"     Last Wednesday  4. PRIOR HISTORY: "Do you have any history of prior problems with your breasts?" (e.g., lumps, cancer, fibrocystic breast disease)     Yes had this issue before 6. OTHER SYMPTOMS: "Do you have any other symptoms?" (e.g., fever, breast pain, redness or rash, nipple discharge)     Pain 8/10, redness and oozing,  Protocols used: Breast Symptoms-A-AH

## 2021-11-17 NOTE — Progress Notes (Signed)
Virtual Visit Note  I discussed the limitations, risks, security and privacy concerns of performing an evaluation and management service by video and the availability of in person appointments. I also discussed with the patient that there may be a patient responsible charge related to this service. The patient expressed understanding and agreed to proceed.    I connected with Caroline Williams on 11/17/21  at   2:50 PM EDT  EDT by VIDEO and verified that I am speaking with the correct person using two identifiers.   Location of Patient: Private Residence   Location of Provider: [facility] [facility]    Persons participating in VIRTUAL visit: Caroline Rankins FNP-BC Caroline Williams    History of Present Illness: VIRTUAL visit for: Breast lump  and refill of incontinence supplies She has a past medical history of Anxiety, Arthritis, Asthma, Bipolar 1 disorder, Breast discharge, Breast abscess, Chronic pain, Depression, DM2, GERD, History of kidney stones, History of knee replacement, total, Hypertension, Hypothyroidism, N&V, Peripheral neuropathy, Pneumonia, Sciatica, Sleep apnea, Thyroid disease.     Patient presents for evaluation of right breast abscess. Lesion is located in the 12 o'clock position of the right nipple. She endorses a history of previous right breast abscesses requiring surgical intervention over the past few years. Last mammogram was 02-2020.  Symptoms of breast abscess include painful lump with purulent drainage.  Patient does have diabetes.    Incontinence She is unable to control her urine.  Incontinence is not limited to stress or urge. Associated symptoms include: nocturia,  increased frequency with no dysuria and incontinence during sex.  Symptoms have been present a few years. She has been prescribed oxybutynin.   Past Medical History:  Diagnosis Date   Anxiety    Arthritis    Asthma    Bipolar 1 disorder ([place])    Breast discharge     Breast lump    Breast pain    Chronic pain    on MS Contin and oxycodone   Depression    Diabetes mellitus    diet and exercise controlled   Escherichia coli (E. coli) infection    Fever    GERD (gastroesophageal reflux disease)    History of chest pain    History of kidney stones    History of knee replacement, total    Hypertension    Hypothyroidism    N&V (nausea and vomiting)    Peripheral neuropathy    Right foot no sensation   Pneumonia    2015,2014   Sciatica    Sleep apnea    does not use CPAP   Thyroid disease    Wears glasses     Past Surgical History:  Procedure Laterality Date   ABDOMINAL HYSTERECTOMY     partial   APPENDECTOMY     BIOPSY  03/14/2018   Procedure: BIOPSY;  Surgeon: Wilford Corner, MD;  Location: [facility];  Service: Endoscopy;;   BREAST DUCTAL SYSTEM EXCISION Right 08/31/2017   Procedure: RIGHT BREAST CENTRAL DUCT EXCISION;  Surgeon: Erroll Luna, MD;  Location: [facility];  Service: General;  Laterality: Right;   BREAST EXCISIONAL BIOPSY Right    BREAST LUMPECTOMY     right   CARDIAC CATHETERIZATION     no significant CAD, nl LV function by 11/08/06 cath   CESAREAN SECTION     x4   COLONOSCOPY WITH PROPOFOL N/A 03/14/2018   Procedure: COLONOSCOPY WITH PROPOFOL;  Surgeon: Wilford Corner, MD;  Location: [facility];  Service: Endoscopy;  Laterality: N/A;   GASTRIC ROUX-EN-Y N/A 11/25/2014   Procedure: LAPAROSCOPIC ROUX-EN-Y GASTRIC BYPASS WITH UPPER ENDOSCOPY;  Surgeon: Johnathan Hausen, MD;  Location: [facility];  Service: General;  Laterality: N/A;   HERNIA REPAIR     JOINT REPLACEMENT     KNEE SURGERY     right   [place]   POLYPECTOMY  03/14/2018   Procedure: POLYPECTOMY;  Surgeon: Wilford Corner, MD;  Location: [facility];  Service: Endoscopy;;   TOTAL KNEE REVISION  06/21/2012   Procedure: TOTAL KNEE REVISION;  Surgeon: Newt Minion, MD;  Location: [facility];  Service:  Orthopedics;  Laterality: Right;  Revision Right Total Knee Arthroplasty    Family History  Problem Relation Age of Onset   Cancer Father        colon   Stroke Father    Heart disease Father    Diabetes Father    Hypertension Father    Depression Sister    Hypertension Mother    Breast cancer Neg Hx     Social History   Socioeconomic History   Marital status: Single    Spouse name: Not on file   Number of children: Not on file   Years of education: Not on file   Highest education level: Not on file  Occupational History   Not on file  Tobacco Use   Smoking status: Former    Packs/day: 0.25    Years: 43.00    Pack years: 10.75    Types: Cigarettes    Quit date: 2020    Years since quitting: 3.4   Smokeless tobacco: Never  Vaping Use   Vaping Use: Never used  Substance and Sexual Activity   Alcohol use: Yes    Comment: social   Drug use: No   Sexual activity: Yes    Birth control/protection: Surgical  Other Topics Concern   Not on file  Social History Narrative   Not on file   Social Determinants of Health   Financial Resource Strain: Medium Risk   Difficulty of Paying Living Expenses: Somewhat hard  Food Insecurity: No Food Insecurity   Worried About Charity fundraiser in the Last Year: Never true   Ran Out of Food in the Last Year: Never true  Transportation Needs: No Transportation Needs   Lack of Transportation (Medical): No   Lack of Transportation (Non-Medical): No  Physical Activity: Inactive   Days of Exercise per Week: 0 days   Minutes of Exercise per Session: 0 min  Stress: No Stress Concern Present   Feeling of Stress : Only a little  Social Connections: Moderately Integrated   Frequency of Communication with Friends and Family: More than three times a week   Frequency of Social Gatherings with Friends and Family: Never   Attends Religious Services: 1 to 4 times per year   Active Member of [employer] or Organizations: No   Attends Theatre manager Meetings: Never   Marital Status: Living with partner     Observations/Objective: Awake, alert and oriented x 3   Review of Systems  Constitutional:  Negative for fever, malaise/fatigue and weight loss.  HENT: Negative.  Negative for nosebleeds.   Eyes: Negative.  Negative for blurred vision, double vision and photophobia.  Respiratory: Negative.  Negative for cough and shortness of breath.   Cardiovascular: Negative.  Negative for chest pain, palpitations and leg swelling.  Gastrointestinal: Negative.  Negative  for heartburn, nausea and vomiting.  Genitourinary:  Positive for frequency. Negative for dysuria, flank pain, hematuria and urgency.       SEE HPI  Musculoskeletal: Negative.  Negative for myalgias.  Skin:        SEE HPI  Neurological: Negative.  Negative for dizziness, focal weakness, seizures and headaches.  Psychiatric/Behavioral: Negative.  Negative for suicidal ideas.    Assessment and Plan: Diagnoses and all orders for this visit:  Breast abscess -     MM Digital Diagnostic Bilat; Future -     US BREAST LTD UNI RIGHT INC AXILLA; Future -     doxycycline (VIBRA-TABS) 100 MG tablet; Take 1 tablet (100 mg total) by mouth 2 (two) times daily for 7 days.  Mixed stress and urge urinary incontinence Forms to be completed for incontinence supplies to be mailed to patient    Follow Up Instructions Return if symptoms worsen or fail to improve.     I discussed the assessment and treatment plan with the patient. The patient was provided an opportunity to ask questions and all were answered. The patient agreed with the plan and demonstrated an understanding of the instructions.   The patient was advised to call back or seek an in-person evaluation if the symptoms worsen or if the condition fails to improve as anticipated.  I provided 12 minutes of face-to-face time during this encounter including median intraservice time, reviewing previous notes, labs,  imaging, medications and explaining diagnosis and management.  Gildardo Pounds, FNP-BC

## 2021-11-19 ENCOUNTER — Encounter: Payer: Medicare HMO | Admitting: Obstetrics & Gynecology

## 2021-11-21 ENCOUNTER — Other Ambulatory Visit: Payer: Self-pay | Admitting: Family Medicine

## 2021-11-23 ENCOUNTER — Other Ambulatory Visit: Payer: Self-pay | Admitting: Family Medicine

## 2021-11-23 ENCOUNTER — Other Ambulatory Visit: Payer: Self-pay | Admitting: Nurse Practitioner

## 2021-11-23 DIAGNOSIS — E039 Hypothyroidism, unspecified: Secondary | ICD-10-CM

## 2021-11-23 DIAGNOSIS — L0291 Cutaneous abscess, unspecified: Secondary | ICD-10-CM

## 2021-11-23 MED ORDER — DICLOFENAC SODIUM 1 % EX GEL: 2.0000 g | Freq: Two times a day (BID) | CUTANEOUS | 1 refills | Status: DC | PRN

## 2021-11-24 DIAGNOSIS — M5416 Radiculopathy, lumbar region: Secondary | ICD-10-CM | POA: Diagnosis not present

## 2021-11-24 NOTE — Telephone Encounter (Signed)
Dose changed 06/26/21.

## 2021-11-26 DIAGNOSIS — M19031 Primary osteoarthritis, right wrist: Secondary | ICD-10-CM | POA: Diagnosis not present

## 2021-11-27 ENCOUNTER — Ambulatory Visit
Admission: RE | Admit: 2021-11-27 | Discharge: 2021-11-27 | Disposition: A | Discharge status: Home / Self Care | Payer: Medicare HMO | Source: Ambulatory Visit | Attending: Nurse Practitioner | Admitting: Nurse Practitioner

## 2021-11-27 ENCOUNTER — Telehealth: Payer: Self-pay | Admitting: Family Medicine

## 2021-11-27 DIAGNOSIS — R928 Other abnormal and inconclusive findings on diagnostic imaging of breast: Secondary | ICD-10-CM | POA: Diagnosis not present

## 2021-11-27 DIAGNOSIS — L0291 Cutaneous abscess, unspecified: Secondary | ICD-10-CM

## 2021-11-27 DIAGNOSIS — N611 Abscess of the breast and nipple: Secondary | ICD-10-CM | POA: Diagnosis not present

## 2021-11-27 DIAGNOSIS — Z720 Tobacco use: Secondary | ICD-10-CM

## 2021-11-27 NOTE — Telephone Encounter (Signed)
Copied from [place] [id number]. Topic: General - Call Back - No Documentation >> Nov 26, 2021  4:48 PM Sabas Sous wrote: Wants to know if PCP will cosign ultrasound as well  Nevaeh calling from the [place] with [facility] contact: [phone]

## 2021-11-27 NOTE — Telephone Encounter (Signed)
Yes

## 2021-11-28 ENCOUNTER — Other Ambulatory Visit: Payer: Self-pay | Admitting: Family Medicine

## 2021-11-30 ENCOUNTER — Telehealth: Payer: Self-pay

## 2021-11-30 ENCOUNTER — Other Ambulatory Visit: Payer: Self-pay | Admitting: Family Medicine

## 2021-11-30 ENCOUNTER — Other Ambulatory Visit: Payer: Self-pay | Admitting: Nurse Practitioner

## 2021-11-30 DIAGNOSIS — N611 Abscess of the breast and nipple: Secondary | ICD-10-CM

## 2021-11-30 MED ORDER — FLUCONAZOLE 150 MG PO TABS: 150.0000 mg | ORAL_TABLET | Freq: Once | ORAL | 0 refills | Status: AC

## 2021-11-30 MED ORDER — PROMETHAZINE HCL 25 MG PO TABS: 25.0000 mg | ORAL_TABLET | Freq: Three times a day (TID) | ORAL | 0 refills | Status: DC | PRN

## 2021-11-30 NOTE — Progress Notes (Unsigned)
Prescription sent to pharmacy.

## 2021-11-30 NOTE — Telephone Encounter (Signed)
Pt state that her recent antibiotic is making her have nausea and a yeast infection, she is requesting medication.

## 2021-11-30 NOTE — Progress Notes (Signed)
Repeat mammogram in 1 year  F/U right breast Ultrasound in 7 days. If you have not heard from the breast center by Thursday you will need to give them a call to schedule.

## 2021-12-01 ENCOUNTER — Other Ambulatory Visit: Payer: Self-pay | Admitting: Family Medicine

## 2021-12-01 DIAGNOSIS — N3946 Mixed incontinence: Secondary | ICD-10-CM

## 2021-12-01 MED ORDER — CICLOPIROX 8 % EX SOLN
1.0000 | CUTANEOUS | 1 refills | Status: DC
Start: 2021-12-01 — End: 2022-11-03

## 2021-12-01 NOTE — Progress Notes (Signed)
Call placed to patient and was informed of medication being refilled.

## 2021-12-02 LAB — AEROBIC/ANAEROBIC CULTURE W GRAM STAIN (SURGICAL/DEEP WOUND)
Gram Stain: NONE SEEN
Special Requests: NORMAL

## 2021-12-09 ENCOUNTER — Ambulatory Visit
Admission: RE | Admit: 2021-12-09 | Discharge: 2021-12-09 | Disposition: A | Discharge status: Home / Self Care | Payer: Medicare HMO | Source: Ambulatory Visit | Attending: Nurse Practitioner | Admitting: Nurse Practitioner

## 2021-12-09 ENCOUNTER — Other Ambulatory Visit: Payer: Self-pay | Admitting: Family Medicine

## 2021-12-09 DIAGNOSIS — N3946 Mixed incontinence: Secondary | ICD-10-CM

## 2021-12-09 DIAGNOSIS — N611 Abscess of the breast and nipple: Secondary | ICD-10-CM

## 2021-12-09 DIAGNOSIS — N644 Mastodynia: Secondary | ICD-10-CM | POA: Diagnosis not present

## 2021-12-09 NOTE — Telephone Encounter (Signed)
Requested Prescriptions  Pending Prescriptions Disp Refills  . oxybutynin (DITROPAN) 5 MG tablet [Pharmacy Med Name: OXYBUTYNIN '5MG'$  TABLETS] 180 tablet 0    Sig: TAKE 1 TABLET(5 MG) BY MOUTH TWICE DAILY     Urology:  Bladder Agents Passed - 12/09/2021  8:08 AM      Passed - Valid encounter within last 12 months    Recent Outpatient Visits          3 weeks ago Breast abscess   [place], [place] W, NP   4 months ago Type 2 diabetes mellitus with chronic painful diabetic neuropathy ([place])   [place], [place], MD   6 months ago [place], Charlane Ferretti, MD   8 months ago Type 2 diabetes mellitus with chronic painful diabetic neuropathy [facility])   [place], Charlane Ferretti, MD   1 year ago Type 2 diabetes mellitus with chronic painful diabetic neuropathy [facility])   [place], MD      Future Appointments            In 1 month Charlott Rakes, MD [place]

## 2021-12-11 ENCOUNTER — Other Ambulatory Visit: Payer: Self-pay | Admitting: Nurse Practitioner

## 2021-12-11 ENCOUNTER — Ambulatory Visit: Payer: Self-pay

## 2021-12-11 DIAGNOSIS — N611 Abscess of the breast and nipple: Secondary | ICD-10-CM

## 2021-12-11 NOTE — Telephone Encounter (Signed)
3rd attempt to contact patient regarding phenergan not working for nausea. No answer, LVMTCB [phone].

## 2021-12-11 NOTE — Telephone Encounter (Signed)
Summary: nausea   Pt stated she is very sick medication promethazine (PHENERGAN) 25 MG tablet that was sent for nausea is not working.  Pt stated she is on an anti-biotic prescribed by the breast center for an abscess.   Pt hung up while on hold.   Please advise.       2nd attempt to contact patient regarding sx. No answer, LVMTCB [phone].

## 2021-12-11 NOTE — Telephone Encounter (Signed)
Pt stated she is very sick medication promethazine (PHENERGAN) 25 MG tablet that was sent for nausea is not working.  Pt stated she is on an anti-biotic prescribed by the breast center for an abscess.   Left message to call back about symptoms.

## 2021-12-16 DIAGNOSIS — F250 Schizoaffective disorder, bipolar type: Secondary | ICD-10-CM | POA: Diagnosis not present

## 2021-12-16 DIAGNOSIS — F900 Attention-deficit hyperactivity disorder, predominantly inattentive type: Secondary | ICD-10-CM | POA: Diagnosis not present

## 2021-12-16 DIAGNOSIS — F4001 Agoraphobia with panic disorder: Secondary | ICD-10-CM | POA: Diagnosis not present

## 2021-12-30 ENCOUNTER — Ambulatory Visit: Payer: Medicare HMO | Admitting: Skilled Nursing Facility1

## 2022-01-04 DIAGNOSIS — N6011 Diffuse cystic mastopathy of right breast: Secondary | ICD-10-CM | POA: Diagnosis not present

## 2022-01-11 ENCOUNTER — Ambulatory Visit: Payer: Medicare HMO | Admitting: Family Medicine

## 2022-01-13 DIAGNOSIS — G894 Chronic pain syndrome: Secondary | ICD-10-CM | POA: Diagnosis not present

## 2022-01-13 DIAGNOSIS — M5416 Radiculopathy, lumbar region: Secondary | ICD-10-CM | POA: Diagnosis not present

## 2022-01-13 DIAGNOSIS — M1712 Unilateral primary osteoarthritis, left knee: Secondary | ICD-10-CM | POA: Diagnosis not present

## 2022-01-13 DIAGNOSIS — Z6838 Body mass index (BMI) 38.0-38.9, adult: Secondary | ICD-10-CM | POA: Diagnosis not present

## 2022-01-27 ENCOUNTER — Encounter: Payer: Medicare HMO | Attending: Family Medicine | Admitting: Skilled Nursing Facility1

## 2022-01-27 DIAGNOSIS — E669 Obesity, unspecified: Secondary | ICD-10-CM | POA: Insufficient documentation

## 2022-01-27 DIAGNOSIS — E1140 Type 2 diabetes mellitus with diabetic neuropathy, unspecified: Secondary | ICD-10-CM | POA: Diagnosis not present

## 2022-01-27 NOTE — Progress Notes (Signed)
Bariatric Nutrition Follow-Up Visit Medical Nutrition Therapy   NUTRITION ASSESSMENT   [facility]  Anthropometrics    Body Composition Scale 08/04/2021 09/01/2021 01/27/2022  Weight  lbs 212.8 208.3 216  Total Body Fat  % 42.4 41.8 42.8     Visceral Fat '14 14 14  '$ Fat-Free Mass  % 57.5 58.1 57.1     Total Body Water  % 43.2 43.5 43     Muscle-Mass  lbs 30 30.0 30  BMI 35.1 34.3 35.6  Body Fat Displacement           Torso  lbs 55.9 53.9 57.2        Left Leg  lbs 11.1 10.7 11.4        Right Leg  lbs 11.1 10.7 11.4        Left Arm  lbs 5.5 5.3 5.7        Right Arm  lbs 5.5 5.3 5.7   Clinical  Medical hx: DM, kidney stone Medications:  Labs: A1C 6.1, creatinine 1.03, BUN/creatinine 8, albumin 5.0, alkaline phosphatase 157 TSH 81.100    Lifestyle & Dietary Hx  Pt states she has been hanging out with her sisters a lot lately and also lost her elder dog recently which is where her weight gain came from.  Pt states she currently is dealing with a mass on her breast stating they put her on a medication which made her sick.  Pt states she has her nerves to be burnt upcoming and her daughters wedding reception.    Pt states she does not check her blood sugars.   Pt states her boyfriend has been making all of her meals and he is a meat and potatoes person so he doe snot make vegetables.   Pt does smoke.    Estimated daily fluid intake: unknown  oz Estimated daily protein intake: 60+ g Supplements: B12, centrum silver,calcium, potassium (occassionaly), biotin. Bariatric multivitamin (unknown which one) Current average weekly physical activity: ADl's  24-Hr Dietary Recall: one big meal a day First Meal: smoothie (rasp or blueberries, frozen fruit, mango), green tea w/ sugar (2 spoons)... sometimes another smoothie (herbal life smoothie) Snack:  Second Meal: granny smith apple w/ peanut butter Snack:  Third Meal: rotisserie chicken, stuffing, broccoli and cheese Snack:  Beverages:  water (4-5 bottles), ocean spray cranberry juice 16 oz  Post-Op Goals/ Signs/ Symptoms Using straws: yes Drinking while eating: yes Chewing/swallowing difficulties: no Changes in vision: no Changes to mood/headaches: yes: mood changes stating it is due to menopause  Hair loss/changes to skin/nails: no Difficulty focusing/concentrating: yes: ADHD Sweating: menopause  Dizziness/lightheadedness: no Palpitations: no Carbonated/caffeinated beverages: no N/V/D/C/Gas: no longer having constipation  Abdominal pain: no Dumping syndrome: no    NUTRITION DIAGNOSIS  Overweight/obesity ([place]-3.3) related to past poor dietary habits and physical inactivity as evidenced by completed bariatric surgery and following dietary guidelines for continued weight loss and healthy nutrition status.     NUTRITION INTERVENTION continued Nutrition counseling (C-1) and education (E-2) to facilitate bariatric surgery goals, including: The importance of consuming adequate calories as well as certain nutrients daily due to the body's need for essential vitamins, minerals, and fats The importance of daily physical activity and to reach a goal of at least 150 minutes of moderate to vigorous physical activity weekly (or as directed by their physician) due to benefits such as increased musculature and improved lab values The importance of intuitive eating specifically learning hunger-satiety cues and understanding the importance of learning a new  body: The importance of mindful eating to avoid grazing behaviors  Encouraged patient to honor their body's internal hunger and fullness cues.  Throughout the day, check in mentally and rate hunger. Stop eating when satisfied not full regardless of how much food is left on the plate.  Get more if still hungry 20-30 minutes later.  The key is to honor satisfaction so throughout the meal, rate fullness factor and stop when comfortably satisfied not physically full. The key is to honor  hunger and fullness without any feelings of guilt or shame.  Pay attention to what the internal cues are, rather than any external factors. This will enhance the confidence you have in listening to your own body and following those internal cues enabling you to increase how often you eat when you are hungry not out of appetite and stop when you are satisfied not full.  Encouraged pt to continue to eat balanced meals inclusive of non starchy vegetables 2 times a day 7 days a week Encouraged pt to choose lean protein sources: limiting beef, pork, sausage, hotdogs, and lunch meat Encourage pt to choose healthy fats such as plant based limiting animal fats Encouraged pt to continue to drink a minium 64 fluid ounces with half being plain water to satisfy proper hydration  Goals:  Eat non starchy vegetables daily  Handouts Previously Provided Include  Multi and calcium   Learning Style & Readiness for Change Teaching method utilized: Visual & Auditory  Demonstrated degree of understanding via: Teach Back  Readiness Level: Action Barriers to learning/adherence to lifestyle change:   RD's Notes for Next Visit Assess adherence to pt chosen goals   MONITORING & EVALUATION Dietary intake, weekly physical activity, body weight  Next Steps Patient is to follow-up 1 month

## 2022-01-30 ENCOUNTER — Encounter (HOSPITAL_COMMUNITY): Payer: Self-pay

## 2022-01-30 ENCOUNTER — Ambulatory Visit (HOSPITAL_COMMUNITY)
Admission: EM | Admit: 2022-01-30 | Discharge: 2022-01-30 | Disposition: A | Discharge status: Home / Self Care | Payer: Medicare HMO | Attending: Internal Medicine | Admitting: Internal Medicine

## 2022-01-30 DIAGNOSIS — M5416 Radiculopathy, lumbar region: Secondary | ICD-10-CM | POA: Diagnosis not present

## 2022-01-30 MED ORDER — PREDNISONE 10 MG PO TABS: ORAL_TABLET | ORAL | 0 refills | Status: AC

## 2022-01-30 MED ORDER — METHYLPREDNISOLONE SODIUM SUCC 125 MG IJ SOLR
INTRAMUSCULAR | Status: AC
  Filled 2022-01-30: qty 2

## 2022-01-30 MED ORDER — METHYLPREDNISOLONE SODIUM SUCC 125 MG IJ SOLR
80.0000 mg | Freq: Once | INTRAMUSCULAR | Status: AC
  Administered 2022-01-30: 80 mg via INTRAMUSCULAR

## 2022-01-30 NOTE — ED Triage Notes (Signed)
Pt is here for sciatica pain x6days this has been a ongoing issue for pt.

## 2022-01-30 NOTE — ED Provider Notes (Signed)
[place]    CSN: [id number] Arrival date & time: 01/30/22  1023      History   Chief Complaint Chief Complaint  Patient presents with   Knee Pain   Back Pain    Pt is here for a sciatica pain      HPI Caroline Williams is a 61 y.o. female.   Patient presents urgent care for evaluation of low back pain with radiculopathy to the bilateral lower extremities that has worsened over the last 6 days.  Patient states that this is a chronic problem for her for which she gets steroid injections to her lumbar spine every 90 days by her neurosurgeon at [facility] neurosurgery.  Last injection was over 60 days ago and she states that her next appointment for injection is on March 01, 2022.  Pain is a 10 on a scale of 0-10 at this time.  She has been taking her oxycodone and morphine at home without relief of symptoms.  States that she has had a very difficult time getting to the bathroom on time due to pain with walking.  Denies urinary or stool incontinence.  Pain is a burning sensation to the bilateral lower extremities and she states that this pain is "internal".  Denies recent falls or trauma to the back.  States that when this is happened in the past, she has been given steroid injection and a 7-day taper of steroids to help with her pain.  Denies blood/mucus to the stools and hematuria.  No abdominal pain, nausea, vomiting, headache, fever/chills, or dizziness reported.  States she has been walking with 2 canes to help her ambulate due to the pain.  She is a diabetic and states that her fasting blood sugars in the morning are usually around 130.  Says her sugars have been lower over the last 6 or 7 days due to decreased appetite and oral intake due to significant pain.  Patient does not take insulin for her diabetes and denies polydipsia, polyuria, headache, dizziness, and feeling shaky.  No urinary symptoms.  No other aggravating or relieving factors identified at this time for  patient's symptoms.   Knee Pain Associated symptoms: back pain   Back Pain   Past Medical History:  Diagnosis Date   Anxiety    Arthritis    Asthma    Bipolar 1 disorder ([place])    Breast discharge    Breast lump    Breast pain    Chronic pain    on MS Contin and oxycodone   Depression    Diabetes mellitus    diet and exercise controlled   Escherichia coli (E. coli) infection    Fever    GERD (gastroesophageal reflux disease)    History of chest pain    History of kidney stones    History of knee replacement, total    Hypertension    Hypothyroidism    N&V (nausea and vomiting)    Peripheral neuropathy    Right foot no sensation   Pneumonia    2015,2014   Sciatica    Sleep apnea    does not use CPAP   Thyroid disease    Wears glasses     Patient Active Problem List   Diagnosis Date Noted   Bipolar 1 disorder ([place])    Degeneration of lumbar intervertebral disc 08/07/2020   Essential (primary) hypertension 05/07/2020   Other chronic pain 02/14/2020   Elevated blood-pressure reading, without diagnosis of hypertension 02/13/2020  Hyperlipidemia 09/17/2019   COPD mixed type ([place]) 04/17/2019   Tobacco user 04/17/2019   Nocturnal hypoxemia 12/25/2018   Muscle weakness 12/05/2018   Change in bowel habits 03/14/2018   Chest pain 01/26/2016   Hallucination, drug-induced ([place]) 03/12/2015   Hypothyroidism 03/12/2015   Chronic low back pain 01/29/2015   Lumbar spondylosis 01/29/2015   Lap gastric bypass June 2016 11/25/2014   Diabetes mellitus with neuropathy ([place]) 06/20/2013   Chronic pain of right knee 06/20/2013   Morbid obesity ([place]) 07/05/2012    Past Surgical History:  Procedure Laterality Date   ABDOMINAL HYSTERECTOMY     partial   APPENDECTOMY     BIOPSY  03/14/2018   Procedure: BIOPSY;  Surgeon: Wilford Corner, MD;  Location: [facility];  Service: Endoscopy;;   [place] Right 08/31/2017   Procedure: RIGHT BREAST CENTRAL DUCT  EXCISION;  Surgeon: Erroll Luna, MD;  Location: [facility];  Service: General;  Laterality: Right;   BREAST EXCISIONAL BIOPSY Right    BREAST LUMPECTOMY     right   CARDIAC CATHETERIZATION     no significant CAD, nl LV function by 11/08/06 cath   CESAREAN SECTION     x4   COLONOSCOPY WITH PROPOFOL N/A 03/14/2018   Procedure: COLONOSCOPY WITH PROPOFOL;  Surgeon: Wilford Corner, MD;  Location: [facility];  Service: Endoscopy;  Laterality: N/A;   GASTRIC ROUX-EN-Y N/A 11/25/2014   Procedure: LAPAROSCOPIC ROUX-EN-Y GASTRIC BYPASS WITH UPPER ENDOSCOPY;  Surgeon: Johnathan Hausen, MD;  Location: [facility];  Service: General;  Laterality: N/A;   HERNIA REPAIR     JOINT REPLACEMENT     KNEE SURGERY     right   [place]   POLYPECTOMY  03/14/2018   Procedure: POLYPECTOMY;  Surgeon: Wilford Corner, MD;  [facility]: [facility];  Service: Endoscopy;;   TOTAL KNEE REVISION  06/21/2012   Procedure: TOTAL KNEE REVISION;  Surgeon: Newt Minion, MD;  Location: [facility];  Service: Orthopedics;  Laterality: Right;  Revision Right Total Knee Arthroplasty    OB History   No obstetric history on file.      Home Medications    Prior to Admission medications   Medication Sig Start Date End Date Taking? Authorizing Provider  predniSONE (DELTASONE) 10 MG tablet Take 3 tablets (30 mg total) by mouth daily with breakfast for 3 days, THEN 2 tablets (20 mg total) daily with breakfast for 3 days, THEN 1 tablet (10 mg total) daily with breakfast for 3 days. 01/30/22 02/07/22 Yes Talbot Grumbling, FNP  acetaminophen (TYLENOL) 500 MG tablet Take 1,000 mg by mouth every 6 (six) hours as needed for moderate pain or headache.    [provider]  albuterol (VENTOLIN HFA) 108 (90 Base) MCG/ACT inhaler INHALE 2 PUFFS INTO THE LUNGS EVERY 6 HOURS AS NEEDED FOR WHEEZING OR SHORTNESS OF BREATH 09/23/21   Charlott Rakes, MD  ALPRAZolam Duanne Moron) 1 MG  tablet Take 1 mg by mouth 3 (three) times daily as needed for anxiety. 07/02/14   [provider]  amphetamine-dextroamphetamine (ADDERALL XR) 30 MG 24 hr capsule Take 30 mg by mouth daily.  03/10/18   [provider]  amphetamine-dextroamphetamine (ADDERALL) 20 MG tablet Take 20 mg by mouth every evening. 10/13/17   [provider]  Bempedoic Acid-Ezetimibe (NEXLIZET) 180-10 MG TABS Take 1 tablet by mouth daily. 04/16/20   Belva Crome, MD  Blood Glucose Monitoring Suppl (ACCU-CHEK GUIDE)  w/Device KIT 1 kit by Does not apply route 3 (three) times daily. To check blood sugars 04/10/21   Charlott Rakes, MD  cetirizine (ZYRTEC) 10 MG tablet Take 1 tablet (10 mg total) by mouth daily. Patient taking differently: Take 10 mg by mouth at bedtime. 04/07/21   Charlott Rakes, MD  ciclopirox (PENLAC) 8 % solution Apply 1 application  topically See admin instructions. Qd x 6 days, then remove with alcohol, then repeat 12/01/21   Charlott Rakes, MD  Cyanocobalamin (VITAMIN B 12 PO) Take 1 tablet by mouth daily. Unsure of dose    [provider]  dapagliflozin propanediol (FARXIGA) 5 MG TABS tablet Take 1 tablet (5 mg total) by mouth daily before breakfast. 04/09/21   Charlott Rakes, MD  diclofenac Sodium (VOLTAREN) 1 % GEL Apply 2-4 g topically 2 (two) times daily as needed (wrist/knee pain). 11/23/21   Charlott Rakes, MD  doxepin (SINEQUAN) 75 MG capsule Take 75 mg by mouth at bedtime. 03/02/18   [provider]  estradiol (ESTRACE VAGINAL) 0.1 MG/GM vaginal cream 1 g per vagina daily Patient not taking: Reported on 09/10/2021 08/21/20   Charlott Rakes, MD  fluticasone (FLONASE) 50 MCG/ACT nasal spray SHAKE LIQUID AND USE 2 SPRAYS IN EACH NOSTRIL DAILY 10/12/21   Charlott Rakes, MD  glucose blood (ACCU-CHEK GUIDE) test strip Use as instructed 04/10/21   Charlott Rakes, MD  hydrocortisone-pramoxine (ANALPRAM HC) 2.5-1 % rectal cream Place 1 application rectally 3  (three) times daily. Patient taking differently: Place 1 application. rectally 2 (two) times daily as needed for hemorrhoids. 07/13/21   Charlott Rakes, MD  isosorbide mononitrate (IMDUR) 30 MG 24 hr tablet Take 1 tablet (30 mg total) by mouth daily. Patient not taking: Reported on 09/10/2021 12/11/20   Belva Crome, MD  Lancets (ACCU-CHEK MULTICLIX) lancets Use as instructed 04/10/21   Charlott Rakes, MD  levothyroxine (SYNTHROID) 150 MCG tablet Take 1 tablet (150 mcg total) by mouth daily before breakfast. 07/14/21   Charlott Rakes, MD  linaclotide (LINZESS) 290 MCG CAPS capsule Take 290 mcg by mouth See admin instructions. Every other night    [provider]  methocarbamol (ROBAXIN) 500 MG tablet Take 1 tablet (500 mg total) by mouth every 8 (eight) hours as needed for muscle spasms. 09/10/21   Petrucelli, Samantha R, PA-C  morphine (MS CONTIN) 15 MG 12 hr tablet Take 1 tablet (15 mg total) by mouth every 12 (twelve) hours. 11/14/14   Bayard Hugger, NP  nabumetone (RELAFEN) 750 MG tablet Take 750 mg by mouth 2 (two) times daily. 08/08/19   [provider]  naloxone [facility]) nasal spray 4 mg/0.1 mL Place 1 spray into the nose as needed (overdose). 05/22/20   [provider]  nitroGLYCERIN (NITROSTAT) 0.4 MG SL tablet DISSOLVE 1 TABLET UNDER THE TONGUE EVERY 5 MINUTES AS NEEDED Patient taking differently: Place 0.4 mg under the tongue every 5 (five) minutes as needed for chest pain. 10/08/19   Belva Crome, MD  Olmesartan-amLODIPine-HCTZ 40-5-25 MG TABS Take 1 tablet by mouth daily. 04/28/21   Belva Crome, MD  omeprazole (PRILOSEC) 40 MG capsule TAKE 1 CAPSULE(40 MG) BY MOUTH DAILY Patient taking differently: Take 40 mg by mouth daily. 01/22/20   Fulp, Cammie, MD  oxybutynin (DITROPAN) 5 MG tablet TAKE 1 TABLET(5 MG) BY MOUTH TWICE DAILY 12/09/21   Charlott Rakes, MD  Oxycodone HCl 10 MG TABS Take 1 tablet (10 mg total) by mouth every 6 (six) hours as needed (for  pain). Patient taking differently: Take 10 mg by mouth 3 (three) times daily. 11/14/14   Bayard Hugger, NP  PRALUENT 150 MG/ML SOAJ INJECT 1 PEN INTO SKIN EVERY 14 DAYS. Patient taking differently: Inject 150 mg into the skin every 14 (fourteen) days. 04/20/21   Belva Crome, MD  promethazine (PHENERGAN) 25 MG tablet Take 1 tablet (25 mg total) by mouth every 8 (eight) hours as needed for nausea or vomiting. 11/30/21   Charlott Rakes, MD  spironolactone (ALDACTONE) 25 MG tablet Take 0.5 tablets (12.5 mg total) by mouth daily. 01/01/21   Belva Crome, MD  umeclidinium-vilanterol (ANORO ELLIPTA) 62.5-25 MCG/INH AEPB Inhale 1 puff into the lungs daily. Patient not taking: Reported on 09/10/2021 04/17/19   Deneise Lever, MD  vortioxetine HBr (TRINTELLIX) 20 MG TABS tablet Take 20 mg by mouth daily.    [provider]  VRAYLAR 6 MG CAPS Take 6 mg by mouth at bedtime. 05/08/21   [provider]  escitalopram (LEXAPRO) 20 MG tablet Take 20 mg by mouth daily.  12/14/18  [provider]  LATUDA 120 MG TABS Take 120 mg by mouth at bedtime.  03/04/18 12/14/18  [provider]  LISINOPRIL PO Take by mouth daily.    09/06/11  [provider]  metoCLOPramide (REGLAN) 10 MG tablet Take 1 tablet (10 mg total) by mouth every 8 (eight) hours as needed for nausea. Patient not taking: Reported on 12/06/2019 06/30/18 05/12/20  Mesner, Corene Cornea, MD    Family History Family History  Problem Relation Age of Onset   Cancer Father        colon   Stroke Father    Heart disease Father    Diabetes Father    Hypertension Father    Depression Sister    Hypertension Mother    Breast cancer Neg Hx     Social History Social History   Tobacco Use   Smoking status: Former    Packs/day: 0.25    Years: 43.00    Total pack years: 10.75    Types: Cigarettes    Quit date: 2020    Years since quitting: 3.6   Smokeless tobacco: Never  Vaping Use   Vaping Use: Never used   Substance Use Topics   Alcohol use: Yes    Comment: social   Drug use: No     Allergies   Aspirin, Bee venom, Sulfa antibiotics, Other, and Repatha [evolocumab]   Review of Systems Review of Systems  Musculoskeletal:  Positive for back pain.  Per HPI   Physical Exam Triage Vital Signs ED Triage Vitals  Enc Vitals Group     BP 01/30/22 1147 (!) 157/86     Pulse Rate 01/30/22 1147 67     Resp 01/30/22 1147 12     Temp 01/30/22 1147 97.8 F (36.6 C)     Temp Source 01/30/22 1147 Oral     SpO2 01/30/22 1147 100 %     Weight 01/30/22 1144 210 lb (95.3 kg)     Height 01/30/22 1144 5' 5"  (1.651 m)     Head Circumference --      Peak Flow --      Pain Score 01/30/22 1144 10     Pain Loc --      Pain Edu? --      Excl. in [place]? --    No data found.  Updated Vital Signs BP (!) 157/86 (BP Location: Right Arm)   Pulse 67  Temp 97.8 F (36.6 C) (Oral)   Resp 12   Ht 5' 5"  (1.651 m)   Wt 210 lb (95.3 kg)   SpO2 100%   BMI 34.95 kg/m   Visual Acuity Right Eye Distance:   Left Eye Distance:   Bilateral Distance:    Right Eye Near:   Left Eye Near:    Bilateral Near:     Physical Exam Vitals and nursing note reviewed.  Constitutional:      Appearance: Normal appearance. She is not ill-appearing or toxic-appearing.     Comments: Very pleasant patient sitting on exam in position of comfort table in no acute distress.   HENT:     Head: Normocephalic and atraumatic.     Right Ear: Hearing and external ear normal.     Left Ear: Hearing and external ear normal.     Nose: Nose normal.     Mouth/Throat:     Lips: Pink.     Mouth: Mucous membranes are moist.  Eyes:     General: Lids are normal. Vision grossly intact. Gaze aligned appropriately.     Extraocular Movements: Extraocular movements intact.     Conjunctiva/sclera: Conjunctivae normal.  Cardiovascular:     Rate and Rhythm: Normal rate and regular rhythm.     Heart sounds: Normal heart sounds, S1 normal  and S2 normal.  Pulmonary:     Effort: Pulmonary effort is normal. No respiratory distress.     Breath sounds: Normal breath sounds and air entry.  Abdominal:     Palpations: Abdomen is soft.  Musculoskeletal:     Cervical back: Normal and neck supple.     Thoracic back: Normal.     Comments: Lumbar spine: No bony or paraspinal tenderness to palpation of the lumbar spine.  No ecchymosis or obvious deformity present.  Tenderness with palpation of bilateral sciatic joints with radiculopathy to the bilateral lower extremities.  No lower extremity swelling visualized or palpated.  +2 anterior tibialis pulses bilaterally.  No rash to the bilateral lower extremities.  Patient walks with antalgic gait due to pain.  Skin:    General: Skin is warm and dry.     Capillary Refill: Capillary refill takes less than 2 seconds.     Findings: No rash.  Neurological:     General: No focal deficit present.     Mental Status: She is alert and oriented to person, place, and time. Mental status is at baseline.     Cranial Nerves: No dysarthria or facial asymmetry.     Gait: Gait is intact.     Comments: 5/5 strength against resistance with dorsi flexion and plantarflexion of the bilateral lower extremities.  Sensation is intact.  No focal neurologic deficit identified to patient's exam at this time.  Psychiatric:        Mood and Affect: Mood normal.        Speech: Speech normal.        Behavior: Behavior normal.        Thought Content: Thought content normal.        Judgment: Judgment normal.      UC Treatments / Results  Labs (all labs ordered are listed, but only abnormal results are displayed) Labs Reviewed - No data to display  EKG   Radiology No results found.  Procedures Procedures (including critical care time)  Medications Ordered in UC Medications  methylPREDNISolone sodium succinate (SOLU-MEDROL) 125 mg/2 mL injection 80 mg (80 mg Intramuscular Given 01/30/22 1244)  Initial  Impression / Assessment and Plan / UC Course  I have reviewed the triage vital signs and the nursing notes.  Pertinent labs & imaging results that were available during my care of the patient were reviewed by me and considered in my medical decision making (see chart for details).   1.  Lumbar radiculopathy Symptoms and physical exam are consistent with exacerbation of patient's chronic lumbar radiculopathy and sciatic nerve pain.  Patient given 80 mg injection of Solu-Medrol in the clinic.  She is to begin taking a 9-day taper of prednisone starting tomorrow with breakfast every day as prescribed.  Advised patient to closely watch her blood sugars to ensure she does not suffer from any hyperglycemia related to steroid use.  Patient advised to call her neurosurgeon at [facility] neurosurgery on Monday to schedule an appointment for soon as possible for follow-up as her lumbar injections are not lasting as long as they used to for her chronic symptoms.  Advised patient to increase her water intake to at least 8 cups of water per day to stay well-hydrated.  Strict ED return precautions given if no improvement or if she develops worsening symptoms over the next 2 to 3 days while taking steroids.  Patient agreeable with this plan.  Neurologic exam is stable in clinic without focal deficit.  No clinical indication for referral to higher level of care at this time due to stable exam findings and hemodynamically stable vital signs.  Discussed physical exam and available lab work findings in clinic with patient.  Counseled patient regarding appropriate use of medications and potential side effects for all medications recommended or prescribed today. Discussed red flag signs and symptoms of worsening condition,when to call the PCP office, return to urgent care, and when to seek higher level of care in the emergency department. Patient verbalizes understanding and agreement with plan. All questions answered.  Patient discharged in stable condition.  Final Clinical Impressions(s) / UC Diagnoses   Final diagnoses:  Lumbar radiculopathy     Discharge Instructions      You were given a steroid injection for your pain today.  Start taking your 9-day steroid taper starting tomorrow with breakfast as prescribed.   Take 30 mg (3 pills) of prednisone for 3 days daily with breakfast, then 20 mg (2 pills) for 3 days daily with breakfast, then 10 mg (1 pill) for 3 days daily with breakfast.   Call your neurosurgeon for a follow-up appointment for soon as possible.   Closely monitor your sugars for the next few days as steroids will likely cause your blood sugars to increase.  Increase your water intake to at least 8 cups of water per day to stay well-hydrated.  If you develop any new or worsening symptoms, or if your symptoms do not improve with steroids/steroid taper, please return to urgent care or go to the emergency room if severe symptoms.  I hope you feel better!     ED Prescriptions     Medication Sig Dispense Auth. Provider   predniSONE (DELTASONE) 10 MG tablet Take 3 tablets (30 mg total) by mouth daily with breakfast for 3 days, THEN 2 tablets (20 mg total) daily with breakfast for 3 days, THEN 1 tablet (10 mg total) daily with breakfast for 3 days. 18 tablet Talbot Grumbling, FNP      PDMP not reviewed this encounter.   Talbot Grumbling, [place] 01/30/22 1256

## 2022-01-30 NOTE — Discharge Instructions (Addendum)
You were given a steroid injection for your pain today.  Start taking your 9-day steroid taper starting tomorrow with breakfast as prescribed.   Take 30 mg (3 pills) of prednisone for 3 days daily with breakfast, then 20 mg (2 pills) for 3 days daily with breakfast, then 10 mg (1 pill) for 3 days daily with breakfast.   Call your neurosurgeon for a follow-up appointment for soon as possible.   Closely monitor your sugars for the next few days as steroids will likely cause your blood sugars to increase.  Increase your water intake to at least 8 cups of water per day to stay well-hydrated.  If you develop any new or worsening symptoms, or if your symptoms do not improve with steroids/steroid taper, please return to urgent care or go to the emergency room if severe symptoms.  I hope you feel better!

## 2022-02-03 ENCOUNTER — Other Ambulatory Visit: Payer: Self-pay

## 2022-02-03 ENCOUNTER — Telehealth: Payer: Self-pay | Admitting: Family Medicine

## 2022-02-03 NOTE — Telephone Encounter (Signed)
Spoke to [employer] rep and documents will be faxed over for provider to sign.

## 2022-02-03 NOTE — Telephone Encounter (Signed)
Copied from [place] [id number]. Topic: General - Other >> Feb 03, 2022 11:55 AM [place] B wrote: Reason for [id number] called in states spoke with Dr Raul Del on June 6 and NT,about incontinence supplies from [employer]. She says she needs these supplies soon for upcoming surgery she is having on breast and doesn't want to have an accident during this. Please call back

## 2022-02-08 ENCOUNTER — Telehealth: Payer: Self-pay | Admitting: Interventional Cardiology

## 2022-02-08 MED ORDER — OLMESARTAN-AMLODIPINE-HCTZ 40-5-25 MG PO TABS: 1.0000 | ORAL_TABLET | Freq: Every day | ORAL | 0 refills | Status: DC

## 2022-02-08 NOTE — Telephone Encounter (Signed)
Returned call to pt.  She is aware that she is overdue for an appt.  Offered to schedule and pt asked if she can call back. Advised pt we would send in a 30 day supply to [facility].

## 2022-02-08 NOTE — Telephone Encounter (Signed)
*  STAT* If patient is at the pharmacy, call can be transferred to refill team.   1. Which medications need to be refilled? (please list name of each medication and dose if known)    Olmesartan-amLODIPine-HCTZ 40-5-25 MG TABS    2. Which pharmacy/location (including street and city if local pharmacy) is medication to be sent to?   [facility]  [address] [place], [place], [place] [address] [phone]  3. Do they need a 30 day or 90 day supply? [place] called on behalf on pt requesting a pharmacy switch to [facility]

## 2022-02-09 DIAGNOSIS — N6452 Nipple discharge: Secondary | ICD-10-CM | POA: Diagnosis not present

## 2022-02-09 DIAGNOSIS — N6041 Mammary duct ectasia of right breast: Secondary | ICD-10-CM | POA: Diagnosis not present

## 2022-02-23 DIAGNOSIS — R32 Unspecified urinary incontinence: Secondary | ICD-10-CM | POA: Diagnosis not present

## 2022-03-01 DIAGNOSIS — M5416 Radiculopathy, lumbar region: Secondary | ICD-10-CM | POA: Diagnosis not present

## 2022-03-03 ENCOUNTER — Encounter: Payer: Medicare HMO | Attending: Family Medicine | Admitting: Skilled Nursing Facility1

## 2022-03-03 ENCOUNTER — Encounter: Payer: Self-pay | Admitting: Skilled Nursing Facility1

## 2022-03-03 DIAGNOSIS — E1140 Type 2 diabetes mellitus with diabetic neuropathy, unspecified: Secondary | ICD-10-CM | POA: Insufficient documentation

## 2022-03-03 NOTE — Progress Notes (Signed)
Bariatric Nutrition Follow-Up Visit Medical Nutrition Therapy   NUTRITION ASSESSMENT   [facility]  Anthropometrics    Body Composition Scale 09/01/2021 01/27/2022 03/03/2022  Weight  lbs 208.3 216 205  Total Body Fat  % 41.8 42.8 41.4     Visceral Fat '14 14 13  '$ Fat-Free Mass  % 58.1 57.1 58.5     Total Body Water  % 43.5 43 43.7     Muscle-Mass  lbs 30.0 30 29.9  BMI 34.3 35.6 33.8  Body Fat Displacement           Torso  lbs 53.9 57.2 52.6        Left Leg  lbs 10.7 11.4 10.5        Right Leg  lbs 10.7 11.4 10.5        Left Arm  lbs 5.3 5.7 5.2        Right Arm  lbs 5.3 5.7 5.2   Clinical  Medical hx: DM, kidney stone Medications:  Labs: A1C 6.1, creatinine 1.03, BUN/creatinine 8, albumin 5.0, alkaline phosphatase 157 TSH 81.100    Lifestyle & Dietary Hx  Pt states she does not check her blood sugars.   Pt states her boyfriend has been making all of her meals and he is a meat and potatoes person so he doe snot make vegetables.   Pt does smoke.   Pt arrives having lost about 11 pounds.  Pt states she had an abscess in her breast surgical cared for.  Pt states she has been laid up with sciatica pan and got depressed from her boyfriend having to bath her and take 100% care of her while he was in pain.  Pt states she got her nerves burnt in her back this week.  Pt states her daughters wedding reception is the 21st of October.  Pt states her blood sugars and blood pressures have been good.    Estimated daily fluid intake: unknown  oz Estimated daily protein intake: 60+ g Supplements: B12, centrum silver,calcium, potassium (occassionaly), biotin. Bariatric multivitamin (unknown which one) Current average weekly physical activity: ADl's  24-Hr Dietary Recall: one big meal a day; usually wakes around 9-11am First Meal: 2 sausage pattys on toast with starwberry jelly or dijon mustard + smoothie (rasp or blueberries, frozen fruit, mango), green tea w/ sugar (2 spoons)...  sometimes another smoothie (herbal life smoothie) Snack:  Second Meal: granny smith apple w/ peanut butter Snack:  Third Meal: rotisserie chicken, stuffing, broccoli and cheese Snack:  Beverages: water (4-5 bottles), ocean spray cranberry juice 16 oz  Post-Op Goals/ Signs/ Symptoms Using straws: yes Drinking while eating: yes Chewing/swallowing difficulties: no Changes in vision: no Changes to mood/headaches: yes: mood changes stating it is due to menopause  Hair loss/changes to skin/nails: no Difficulty focusing/concentrating: yes: ADHD Sweating: menopause  Dizziness/lightheadedness: no Palpitations: no Carbonated/caffeinated beverages: no N/V/D/C/Gas: no longer having constipation  Abdominal pain: no Dumping syndrome: no    NUTRITION DIAGNOSIS  Overweight/obesity ([place]-3.3) related to past poor dietary habits and physical inactivity as evidenced by completed bariatric surgery and following dietary guidelines for continued weight loss and healthy nutrition status.     NUTRITION INTERVENTION continued Nutrition counseling (C-1) and education (E-2) to facilitate bariatric surgery goals, including: The importance of consuming adequate calories as well as certain nutrients daily due to the body's need for essential vitamins, minerals, and fats The importance of daily physical activity and to reach a goal of at least 150 minutes of moderate to vigorous physical  activity weekly (or as directed by their physician) due to benefits such as increased musculature and improved lab values The importance of intuitive eating specifically learning hunger-satiety cues and understanding the importance of learning a new body: The importance of mindful eating to avoid grazing behaviors  Encouraged patient to honor their body's internal hunger and fullness cues.  Throughout the day, check in mentally and rate hunger. Stop eating when satisfied not full regardless of how much food is left on the plate.   Get more if still hungry 20-30 minutes later.  The key is to honor satisfaction so throughout the meal, rate fullness factor and stop when comfortably satisfied not physically full. The key is to honor hunger and fullness without any feelings of guilt or shame.  Pay attention to what the internal cues are, rather than any external factors. This will enhance the confidence you have in listening to your own body and following those internal cues enabling you to increase how often you eat when you are hungry not out of appetite and stop when you are satisfied not full.  Encouraged pt to continue to eat balanced meals inclusive of non starchy vegetables 2 times a day 7 days a week Encouraged pt to choose lean protein sources: limiting beef, pork, sausage, hotdogs, and lunch meat Encourage pt to choose healthy fats such as plant based limiting animal fats Encouraged pt to continue to drink a minium 64 fluid ounces with half being plain water to satisfy proper hydration  Goals:  Eat non starchy vegetables daily Go for a walk 10 minutes each day Quit smoking   Handouts Previously Provided Include  Multi and calcium   Learning Style & Readiness for Change Teaching method utilized: Visual & Auditory  Demonstrated degree of understanding via: Teach Back  Readiness Level: Action Barriers to learning/adherence to lifestyle change:   RD's Notes for Next Visit Assess adherence to pt chosen goals: pts goal is to be in the 190's   MONITORING & EVALUATION Dietary intake, weekly physical activity, body weight  Next Steps Patient is to follow-up 2 month

## 2022-03-16 DIAGNOSIS — R32 Unspecified urinary incontinence: Secondary | ICD-10-CM | POA: Diagnosis not present

## 2022-04-07 DIAGNOSIS — F1190 Opioid use, unspecified, uncomplicated: Secondary | ICD-10-CM | POA: Diagnosis not present

## 2022-04-07 DIAGNOSIS — M5416 Radiculopathy, lumbar region: Secondary | ICD-10-CM | POA: Diagnosis not present

## 2022-04-07 DIAGNOSIS — Z6838 Body mass index (BMI) 38.0-38.9, adult: Secondary | ICD-10-CM | POA: Diagnosis not present

## 2022-04-07 DIAGNOSIS — G894 Chronic pain syndrome: Secondary | ICD-10-CM | POA: Diagnosis not present

## 2022-04-14 DIAGNOSIS — R32 Unspecified urinary incontinence: Secondary | ICD-10-CM | POA: Diagnosis not present

## 2022-04-16 ENCOUNTER — Ambulatory Visit (HOSPITAL_COMMUNITY)
Admission: EM | Admit: 2022-04-16 | Discharge: 2022-04-16 | Disposition: A | Discharge status: Home / Self Care | Payer: Medicare HMO | Attending: Emergency Medicine | Admitting: Emergency Medicine

## 2022-04-16 ENCOUNTER — Encounter (HOSPITAL_COMMUNITY): Payer: Self-pay | Admitting: Emergency Medicine

## 2022-04-16 DIAGNOSIS — M5432 Sciatica, left side: Secondary | ICD-10-CM

## 2022-04-16 DIAGNOSIS — M5431 Sciatica, right side: Secondary | ICD-10-CM | POA: Diagnosis not present

## 2022-04-16 MED ORDER — PREDNISONE 10 MG PO TABS: ORAL_TABLET | ORAL | 0 refills | Status: DC

## 2022-04-16 MED ORDER — METHYLPREDNISOLONE SODIUM SUCC 125 MG IJ SOLR
80.0000 mg | Freq: Once | INTRAMUSCULAR | Status: AC
  Administered 2022-04-16: 80 mg via INTRAMUSCULAR

## 2022-04-16 MED ORDER — KETOROLAC TROMETHAMINE 30 MG/ML IJ SOLN
30.0000 mg | Freq: Once | INTRAMUSCULAR | Status: AC
  Administered 2022-04-16: 30 mg via INTRAMUSCULAR

## 2022-04-16 MED ORDER — METHYLPREDNISOLONE SODIUM SUCC 125 MG IJ SOLR
INTRAMUSCULAR | Status: AC
  Filled 2022-04-16: qty 2

## 2022-04-16 MED ORDER — KETOROLAC TROMETHAMINE 30 MG/ML IJ SOLN
INTRAMUSCULAR | Status: AC
  Filled 2022-04-16: qty 1

## 2022-04-16 NOTE — ED Provider Notes (Signed)
[facility]    CSN: [id number] Arrival date & time: 04/16/22  1729      History   Chief Complaint Chief Complaint  Patient presents with   Back Pain    HPI Caroline Williams is a 61 y.o. female.  Patient complaining of bilateral lower back pain that is ongoing.  Patient denies any fall or trauma.  Patient reports pain radiates down both legs and is consistent with her continuous issues with sciatica.  Patient reports she is being followed by neurosurgery and an order for an MRI was placed but she has not been notified as to when this can occur.  Patient report reports constant pain that is now giving her difficulty with ambulation.  Patient denies any incontinence of bowel or bladder.    Back Pain   Past Medical History:  Diagnosis Date   Anxiety    Arthritis    Asthma    Bipolar 1 disorder ([place])    Breast discharge    Breast lump    Breast pain    Chronic pain    on MS Contin and oxycodone   Depression    Diabetes mellitus    diet and exercise controlled   Escherichia coli (E. coli) infection    Fever    GERD (gastroesophageal reflux disease)    History of chest pain    History of kidney stones    History of knee replacement, total    Hypertension    Hypothyroidism    N&V (nausea and vomiting)    Peripheral neuropathy    Right foot no sensation   Pneumonia    2015,2014   Sciatica    Sleep apnea    does not use CPAP   Thyroid disease    Wears glasses     Patient Active Problem List   Diagnosis Date Noted   Bipolar 1 disorder ([place])    Degeneration of lumbar intervertebral disc 08/07/2020   Essential (primary) hypertension 05/07/2020   Other chronic pain 02/14/2020   Elevated blood-pressure reading, without diagnosis of hypertension 02/13/2020   Hyperlipidemia 09/17/2019   COPD mixed type ([place]) 04/17/2019   Tobacco user 04/17/2019   Nocturnal hypoxemia 12/25/2018   Muscle weakness 12/05/2018   Change in bowel habits 03/14/2018   Chest pain  01/26/2016   Hallucination, drug-induced ([place]) 03/12/2015   Hypothyroidism 03/12/2015   Chronic low back pain 01/29/2015   Lumbar spondylosis 01/29/2015   Lap gastric bypass June 2016 11/25/2014   Diabetes mellitus with neuropathy ([place]) 06/20/2013   Chronic pain of right knee 06/20/2013   Morbid obesity ([place]) 07/05/2012    Past Surgical History:  Procedure Laterality Date   ABDOMINAL HYSTERECTOMY     partial   APPENDECTOMY     BIOPSY  03/14/2018   Procedure: BIOPSY;  Surgeon: Wilford Corner, MD;  Location: [facility];  Service: Endoscopy;;   BREAST DUCTAL SYSTEM EXCISION Right 08/31/2017   Procedure: RIGHT BREAST CENTRAL DUCT EXCISION;  Surgeon: Erroll Luna, MD;  Location: [facility];  Service: General;  Laterality: Right;   BREAST EXCISIONAL BIOPSY Right    BREAST LUMPECTOMY     right   CARDIAC CATHETERIZATION     no significant CAD, nl LV function by 11/08/06 cath   CESAREAN SECTION     x4   COLONOSCOPY WITH PROPOFOL N/A 03/14/2018   Procedure: COLONOSCOPY WITH PROPOFOL;  Surgeon: Wilford Corner, MD;  Location: [facility];  Service: Endoscopy;  Laterality: N/A;   GASTRIC ROUX-EN-Y N/A  11/25/2014   Procedure: LAPAROSCOPIC ROUX-EN-Y GASTRIC BYPASS WITH UPPER ENDOSCOPY;  Surgeon: Johnathan Hausen, MD;  Location: [facility];  Service: General;  Laterality: N/A;   HERNIA REPAIR     JOINT REPLACEMENT     KNEE SURGERY     right   [place]   POLYPECTOMY  03/14/2018   Procedure: POLYPECTOMY;  Surgeon: Wilford Corner, MD;  Location: [facility];  Service: Endoscopy;;   TOTAL KNEE REVISION  06/21/2012   Procedure: TOTAL KNEE REVISION;  Surgeon: Newt Minion, MD;  Location: [facility];  Service: Orthopedics;  Laterality: Right;  Revision Right Total Knee Arthroplasty    OB History   No obstetric history on file.      Home Medications    Prior to Admission medications   Medication Sig Start Date End Date Taking?  Authorizing Provider  predniSONE (DELTASONE) 10 MG tablet Take 3 tablets (30 mg total) by mouth daily with breakfast for 3 days, THEN 2 tablets (20 mg total) daily with breakfast for 3 days, then 1 tablet (10 mg total) daily with breakfast for 3 days. 04/16/22  Yes Flossie Dibble, NP  acetaminophen (TYLENOL) 500 MG tablet Take 1,000 mg by mouth every 6 (six) hours as needed for moderate pain or headache.    [provider]  albuterol (VENTOLIN HFA) 108 (90 Base) MCG/ACT inhaler INHALE 2 PUFFS INTO THE LUNGS EVERY 6 HOURS AS NEEDED FOR WHEEZING OR SHORTNESS OF BREATH 09/23/21   Charlott Rakes, MD  ALPRAZolam Duanne Moron) 1 MG tablet Take 1 mg by mouth 3 (three) times daily as needed for anxiety. 07/02/14   [provider]  amphetamine-dextroamphetamine (ADDERALL XR) 30 MG 24 hr capsule Take 30 mg by mouth daily.  03/10/18   [provider]  amphetamine-dextroamphetamine (ADDERALL) 20 MG tablet Take 20 mg by mouth every evening. 10/13/17   [provider]  Bempedoic Acid-Ezetimibe (NEXLIZET) 180-10 MG TABS Take 1 tablet by mouth daily. 04/16/20   Belva Crome, MD  Blood Glucose Monitoring Suppl (ACCU-CHEK GUIDE) w/Device KIT 1 kit by Does not apply route 3 (three) times daily. To check blood sugars 04/10/21   Charlott Rakes, MD  cetirizine (ZYRTEC) 10 MG tablet Take 1 tablet (10 mg total) by mouth daily. Patient taking differently: Take 10 mg by mouth at bedtime. 04/07/21   Charlott Rakes, MD  ciclopirox (PENLAC) 8 % solution Apply 1 application  topically See admin instructions. Qd x 6 days, then remove with alcohol, then repeat 12/01/21   Charlott Rakes, MD  Cyanocobalamin (VITAMIN B 12 PO) Take 1 tablet by mouth daily. Unsure of dose    [provider]  dapagliflozin propanediol (FARXIGA) 5 MG TABS tablet Take 1 tablet (5 mg total) by mouth daily before breakfast. 04/09/21   Charlott Rakes, MD  diclofenac Sodium (VOLTAREN) 1 % GEL Apply 2-4 g topically 2  (two) times daily as needed (wrist/knee pain). 11/23/21   Charlott Rakes, MD  doxepin (SINEQUAN) 75 MG capsule Take 75 mg by mouth at bedtime. 03/02/18   [provider]  estradiol (ESTRACE VAGINAL) 0.1 MG/GM vaginal cream 1 g per vagina daily Patient not taking: Reported on 09/10/2021 08/21/20   Charlott Rakes, MD  fluticasone (FLONASE) 50 MCG/ACT nasal spray SHAKE LIQUID AND USE 2 SPRAYS IN EACH NOSTRIL DAILY 10/12/21   Charlott Rakes, MD  glucose blood (ACCU-CHEK GUIDE) test strip Use as instructed 04/10/21   Charlott Rakes, MD  hydrocortisone-pramoxine [facility]  HC) 2.5-1 % rectal cream Place 1 application rectally 3 (three) times daily. Patient taking differently: Place 1 application. rectally 2 (two) times daily as needed for hemorrhoids. 07/13/21   Charlott Rakes, MD  isosorbide mononitrate (IMDUR) 30 MG 24 hr tablet Take 1 tablet (30 mg total) by mouth daily. Patient not taking: Reported on 09/10/2021 12/11/20   Belva Crome, MD  Lancets (ACCU-CHEK MULTICLIX) lancets Use as instructed 04/10/21   Charlott Rakes, MD  levothyroxine (SYNTHROID) 150 MCG tablet Take 1 tablet (150 mcg total) by mouth daily before breakfast. 07/14/21   Charlott Rakes, MD  linaclotide (LINZESS) 290 MCG CAPS capsule Take 290 mcg by mouth See admin instructions. Every other night    [provider]  methocarbamol (ROBAXIN) 500 MG tablet Take 1 tablet (500 mg total) by mouth every 8 (eight) hours as needed for muscle spasms. 09/10/21   Petrucelli, Samantha R, PA-C  morphine (MS CONTIN) 15 MG 12 hr tablet Take 1 tablet (15 mg total) by mouth every 12 (twelve) hours. 11/14/14   Bayard Hugger, NP  nabumetone (RELAFEN) 750 MG tablet Take 750 mg by mouth 2 (two) times daily. 08/08/19   [provider]  naloxone [facility]) nasal spray 4 mg/0.1 mL Place 1 spray into the nose as needed (overdose). 05/22/20   [provider]  nitroGLYCERIN (NITROSTAT) 0.4 MG SL tablet DISSOLVE 1 TABLET UNDER THE  TONGUE EVERY 5 MINUTES AS NEEDED Patient taking differently: Place 0.4 mg under the tongue every 5 (five) minutes as needed for chest pain. 10/08/19   Belva Crome, MD  Olmesartan-amLODIPine-HCTZ 40-5-25 MG TABS Take 1 tablet by mouth daily. 02/08/22   Belva Crome, MD  omeprazole (PRILOSEC) 40 MG capsule TAKE 1 CAPSULE(40 MG) BY MOUTH DAILY Patient taking differently: Take 40 mg by mouth daily. 01/22/20   Fulp, Cammie, MD  oxybutynin (DITROPAN) 5 MG tablet TAKE 1 TABLET(5 MG) BY MOUTH TWICE DAILY 12/09/21   Charlott Rakes, MD  Oxycodone HCl 10 MG TABS Take 1 tablet (10 mg total) by mouth every 6 (six) hours as needed (for pain). Patient taking differently: Take 10 mg by mouth 3 (three) times daily. 11/14/14   Bayard Hugger, NP  PRALUENT 150 MG/ML SOAJ INJECT 1 PEN INTO SKIN EVERY 14 DAYS. Patient taking differently: Inject 150 mg into the skin every 14 (fourteen) days. 04/20/21   Belva Crome, MD  promethazine (PHENERGAN) 25 MG tablet Take 1 tablet (25 mg total) by mouth every 8 (eight) hours as needed for nausea or vomiting. 11/30/21   Charlott Rakes, MD  spironolactone (ALDACTONE) 25 MG tablet Take 0.5 tablets (12.5 mg total) by mouth daily. 01/01/21   Belva Crome, MD  umeclidinium-vilanterol (ANORO ELLIPTA) 62.5-25 MCG/INH AEPB Inhale 1 puff into the lungs daily. Patient not taking: Reported on 09/10/2021 04/17/19   Deneise Lever, MD  vortioxetine HBr (TRINTELLIX) 20 MG TABS tablet Take 20 mg by mouth daily.    [provider]  VRAYLAR 6 MG CAPS Take 6 mg by mouth at bedtime. 05/08/21   [provider]  escitalopram (LEXAPRO) 20 MG tablet Take 20 mg by mouth daily.  12/14/18  [provider]  LATUDA 120 MG TABS Take 120 mg by mouth at bedtime.  03/04/18 12/14/18  [provider]  LISINOPRIL PO Take by mouth daily.    09/06/11  [provider]  metoCLOPramide (REGLAN) 10 MG tablet Take 1 tablet (10 mg total) by mouth every 8 (eight) hours as  needed  for nausea. Patient not taking: Reported on 12/06/2019 06/30/18 05/12/20  Mesner, Corene Cornea, MD    Family History Family History  Problem Relation Age of Onset   Cancer Father        colon   Stroke Father    Heart disease Father    Diabetes Father    Hypertension Father    Depression Sister    Hypertension Mother    Breast cancer Neg Hx     Social History Social History   Tobacco Use   Smoking status: Former    Packs/day: 0.25    Years: 43.00    Total pack years: 10.75    Types: Cigarettes    Quit date: 2020    Years since quitting: 3.8   Smokeless tobacco: Never  Vaping Use   Vaping Use: Never used  Substance Use Topics   Alcohol use: Yes    Comment: social   Drug use: No     Allergies   Aspirin, Bee venom, Sulfa antibiotics, Other, and Repatha [evolocumab]   Review of Systems Review of Systems  Musculoskeletal:  Positive for back pain.       Pain radiation from both side of back down both legs.      Physical Exam Triage Vital Signs ED Triage Vitals  Enc Vitals Group     BP 04/16/22 1914 (!) 181/103     Pulse Rate 04/16/22 1914 61     Resp 04/16/22 1914 18     Temp 04/16/22 1914 98.3 F (36.8 C)     Temp Source 04/16/22 1914 Oral     SpO2 04/16/22 1914 96 %     Weight --      Height --      Head Circumference --      Peak Flow --      Pain Score 04/16/22 1913 10     Pain Loc --      Pain Edu? --      Excl. in [place]? --    No data found.  Updated Vital Signs BP (!) 181/103 (BP Location: Left Arm)   Pulse 61   Temp 98.3 F (36.8 C) (Oral)   Resp 18   SpO2 96%      Physical Exam Vitals and nursing note reviewed.  Constitutional:      Appearance: Normal appearance.  Musculoskeletal:     Cervical back: Normal.     Thoracic back: Normal.     Lumbar back: Tenderness present. No swelling or deformity. Decreased range of motion. Positive right straight leg raise test and positive left straight leg raise test.  Neurological:     Mental Status:  She is alert.      UC Treatments / Results  Labs (all labs ordered are listed, but only abnormal results are displayed) Labs Reviewed - No data to display  EKG   Radiology No results found.  Procedures Procedures (including critical care time)  Medications Ordered in UC Medications  methylPREDNISolone sodium succinate (SOLU-MEDROL) 125 mg/2 mL injection 80 mg (has no administration in time range)  ketorolac (TORADOL) 30 MG/ML injection 30 mg (has no administration in time range)    Initial Impression / Assessment and Plan / UC Course  I have reviewed the triage vital signs and the nursing notes.  Pertinent labs & imaging results that were available during my care of the patient were reviewed by me and considered in my medical decision making (see chart for details).  Patient was treated for bilateral sciatica.  Patient was given a steroid and Toradol injection in office.  Patient reports that her blood sugars are within controllable range.  Last A1c in epic was 6.1.  Patient was made aware that steroid injections can increase her blood sugar.  Patient insisted that this is the treatment regiment that we will give her relief from the sciatica.  Was stressed to the patient that is very important that through the course of her treatment that she checks her blood sugars daily.  Patient agreed that she always checks her blood sugars every day.  Patient was also made aware that her blood pressure was high today , she states that her blood pressure elevates when she has her sciatica pain .  Patient was told she needs to make sure that she checks her blood pressure at home. Patient was made aware of prednisone prescription, that she should start the prescription tomorrow.  Patient verbalized understanding of instructions.  Final Clinical Impressions(s) / UC Diagnoses   Final diagnoses:  None     Discharge Instructions      Prednisone has been sent to the pharmacy, begin taking  this tomorrow since we gave you the steroid shot today.  For the first 3 days you will take 3 tablets at breakfast, then for another 3 days you will take 2 tablets at breakfast, then for the last 3 days you will take 1 tablet at breakfast.   As discussed, please make sure to check your blood sugars, steroids can cause your blood sugars to go up.    ED Prescriptions     Medication Sig Dispense Auth. Provider   predniSONE (DELTASONE) 10 MG tablet Take 3 tablets (30 mg total) by mouth daily with breakfast for 3 days, THEN 2 tablets (20 mg total) daily with breakfast for 3 days, then 1 tablet (10 mg total) daily with breakfast for 3 days. 18 tablet Flossie Dibble, NP      PDMP not reviewed this encounter.   Flossie Dibble, NP 04/16/22 2002

## 2022-04-16 NOTE — ED Triage Notes (Signed)
Pt reports hx sciatica. Having back pains that radiates down both legs for over week. Doctor aware and ordered MRI but hasn't heard from when it is yet.  Had multiple falls due to pains.

## 2022-04-16 NOTE — Discharge Instructions (Addendum)
Prednisone has been sent to the pharmacy, begin taking this tomorrow since we gave you the steroid shot today.  For the first 3 days you will take 3 tablets at breakfast, then for another 3 days you will take 2 tablets at breakfast, then for the last 3 days you will take 1 tablet at breakfast.   As discussed, please make sure to check your blood sugars, steroids can cause your blood sugars to go up.  Also, make sure that your blood pressure is within normal range once pain resolves, ideally your blood pressure should be below 140/90.

## 2022-04-29 DIAGNOSIS — M545 Low back pain, unspecified: Secondary | ICD-10-CM | POA: Diagnosis not present

## 2022-04-29 DIAGNOSIS — M5416 Radiculopathy, lumbar region: Secondary | ICD-10-CM | POA: Diagnosis not present

## 2022-04-29 DIAGNOSIS — M4316 Spondylolisthesis, lumbar region: Secondary | ICD-10-CM | POA: Diagnosis not present

## 2022-05-03 DIAGNOSIS — R6889 Other general symptoms and signs: Secondary | ICD-10-CM | POA: Diagnosis not present

## 2022-05-10 ENCOUNTER — Other Ambulatory Visit: Payer: Self-pay | Admitting: Family Medicine

## 2022-05-10 DIAGNOSIS — E039 Hypothyroidism, unspecified: Secondary | ICD-10-CM

## 2022-05-11 ENCOUNTER — Encounter: Payer: Medicare HMO | Admitting: Skilled Nursing Facility1

## 2022-05-14 DIAGNOSIS — R32 Unspecified urinary incontinence: Secondary | ICD-10-CM | POA: Diagnosis not present

## 2022-05-25 DIAGNOSIS — F250 Schizoaffective disorder, bipolar type: Secondary | ICD-10-CM | POA: Diagnosis not present

## 2022-05-25 DIAGNOSIS — F4001 Agoraphobia with panic disorder: Secondary | ICD-10-CM | POA: Diagnosis not present

## 2022-05-25 DIAGNOSIS — R6889 Other general symptoms and signs: Secondary | ICD-10-CM | POA: Diagnosis not present

## 2022-06-09 DIAGNOSIS — G894 Chronic pain syndrome: Secondary | ICD-10-CM | POA: Diagnosis not present

## 2022-06-09 DIAGNOSIS — F4001 Agoraphobia with panic disorder: Secondary | ICD-10-CM | POA: Diagnosis not present

## 2022-06-09 DIAGNOSIS — F250 Schizoaffective disorder, bipolar type: Secondary | ICD-10-CM | POA: Diagnosis not present

## 2022-06-09 DIAGNOSIS — F900 Attention-deficit hyperactivity disorder, predominantly inattentive type: Secondary | ICD-10-CM | POA: Diagnosis not present

## 2022-06-09 DIAGNOSIS — R6889 Other general symptoms and signs: Secondary | ICD-10-CM | POA: Diagnosis not present

## 2022-06-11 DIAGNOSIS — M5136 Other intervertebral disc degeneration, lumbar region: Secondary | ICD-10-CM | POA: Diagnosis not present

## 2022-06-11 DIAGNOSIS — M4316 Spondylolisthesis, lumbar region: Secondary | ICD-10-CM | POA: Diagnosis not present

## 2022-06-11 DIAGNOSIS — R6889 Other general symptoms and signs: Secondary | ICD-10-CM | POA: Diagnosis not present

## 2022-06-11 DIAGNOSIS — M48062 Spinal stenosis, lumbar region with neurogenic claudication: Secondary | ICD-10-CM | POA: Diagnosis not present

## 2022-06-22 ENCOUNTER — Ambulatory Visit: Payer: Medicare Other | Attending: Physical Medicine and Rehabilitation

## 2022-06-22 NOTE — Therapy (Incomplete)
OUTPATIENT PHYSICAL THERAPY THORACOLUMBAR EVALUATION   Patient Name: Caroline Williams MRN: [id number] DOB:March 20, 1961, 62 y.o., female Today's Date: 06/22/2022  END OF SESSION:   Past Medical History:  Diagnosis Date   Anxiety    Arthritis    Asthma    Bipolar 1 disorder ([place])    Breast discharge    Breast lump    Breast pain    Chronic pain    on MS Contin and oxycodone   Depression    Diabetes mellitus    diet and exercise controlled   Escherichia coli (E. coli) infection    Fever    GERD (gastroesophageal reflux disease)    History of chest pain    History of kidney stones    History of knee replacement, total    Hypertension    Hypothyroidism    N&V (nausea and vomiting)    Peripheral neuropathy    Right foot no sensation   Pneumonia    2015,2014   Sciatica    Sleep apnea    does not use CPAP   Thyroid disease    Wears glasses    Past Surgical History:  Procedure Laterality Date   ABDOMINAL HYSTERECTOMY     partial   APPENDECTOMY     BIOPSY  03/14/2018   Procedure: BIOPSY;  Surgeon: Wilford Corner, MD;  Location: [facility];  Service: Endoscopy;;   BREAST DUCTAL SYSTEM EXCISION Right 08/31/2017   Procedure: RIGHT BREAST CENTRAL DUCT EXCISION;  Surgeon: Erroll Luna, MD;  Location: [facility];  Service: General;  Laterality: Right;   BREAST EXCISIONAL BIOPSY Right    BREAST LUMPECTOMY     right   CARDIAC CATHETERIZATION     no significant CAD, nl LV function by 11/08/06 cath   CESAREAN SECTION     x4   COLONOSCOPY WITH PROPOFOL N/A 03/14/2018   Procedure: COLONOSCOPY WITH PROPOFOL;  Surgeon: Wilford Corner, MD;  Location: [facility];  Service: Endoscopy;  Laterality: N/A;   GASTRIC ROUX-EN-Y N/A 11/25/2014   Procedure: LAPAROSCOPIC ROUX-EN-Y GASTRIC BYPASS WITH UPPER ENDOSCOPY;  Surgeon: Johnathan Hausen, MD;  Location: [facility];  Service: General;  Laterality: N/A;   HERNIA REPAIR     JOINT REPLACEMENT     KNEE SURGERY     right    [place]   POLYPECTOMY  03/14/2018   Procedure: POLYPECTOMY;  Surgeon: Wilford Corner, MD;  Location: [facility];  Service: Endoscopy;;   TOTAL KNEE REVISION  06/21/2012   Procedure: TOTAL KNEE REVISION;  Surgeon: Newt Minion, MD;  Location: [place] [facility];  Service: Orthopedics;  Laterality: Right;  Revision Right Total Knee Arthroplasty   Patient Active Problem List   Diagnosis Date Noted   Bipolar 1 disorder ([place])    Degeneration of lumbar intervertebral disc 08/07/2020   Essential (primary) hypertension 05/07/2020   Other chronic pain 02/14/2020   Elevated blood-pressure reading, without diagnosis of hypertension 02/13/2020   Hyperlipidemia 09/17/2019   COPD mixed type ([place]) 04/17/2019   Tobacco user 04/17/2019   Nocturnal hypoxemia 12/25/2018   Muscle weakness 12/05/2018   Change in bowel habits 03/14/2018   Chest pain 01/26/2016   Hallucination, drug-induced ([place]) 03/12/2015   Hypothyroidism 03/12/2015   Chronic low back pain 01/29/2015   Lumbar spondylosis 01/29/2015   Lap gastric bypass June 2016 11/25/2014   Diabetes mellitus with neuropathy ([place]) 06/20/2013   Chronic pain of right knee 06/20/2013   Morbid obesity ([place]) 07/05/2012  PCP: Charlott Rakes, MD   REFERRING PROVIDER: Newman Pies, MD   REFERRING DIAG: [phone] (ICD-10-CM) - Spinal stenosis of lumbar region with neurogenic claudication   Rationale for Evaluation and Treatment: Rehabilitation  THERAPY DIAG:  No diagnosis found.  ONSET DATE: ***  SUBJECTIVE:                                                                                                                                                                                           SUBJECTIVE STATEMENT: ***  PERTINENT HISTORY:  ***  PAIN:  Are you having pain? Yes: NPRS scale: ***/10 Pain location: *** Pain description: *** Aggravating factors: *** Relieving factors: ***  PRECAUTIONS:  {Therapy precautions:24002}  WEIGHT BEARING RESTRICTIONS: {Yes ***/No:24003}  FALLS:  Has patient fallen in last 6 months? {fallsyesno:27318}  LIVING ENVIRONMENT: Lives with: {[facility] lives with:25569::"lives with their family"} Lives in: {Lives in:25570} Stairs: {opstairs:27293} Has following equipment at home: {Assistive devices:23999}  OCCUPATION: ***  PLOF: {PLOF:24004}  PATIENT GOALS: ***  NEXT MD VISIT:   OBJECTIVE:   DIAGNOSTIC FINDINGS:  09/10/2021: CT L-spine and T-spine: IMPRESSION: No acute/traumatic thoracic or lumbar spine pathology.  PATIENT SURVEYS:  {rehab surveys:24030}  SCREENING FOR RED FLAGS: Bowel or bladder incontinence: {Yes/No:304960894} Cauda equina syndrome: {Yes/No:304960894}  COGNITION: Overall cognitive status: {cognition:24006}     SENSATION: {sensation:27233}  MUSCLE LENGTH: Hamstrings: Right *** deg; Left *** deg Marcello Moores test: Right *** deg; Left *** deg  POSTURE: {posture:25561}  PALPATION: ***  LUMBAR ROM:   AROM eval  Flexion   Extension   Right lateral flexion   Left lateral flexion   Right rotation   Left rotation    (Blank rows = not tested)   LOWER EXTREMITY MMT:    MMT Right eval Left eval  Hip flexion    Hip extension    Hip abduction    Hip adduction    Hip internal rotation    Hip external rotation    Knee flexion    Knee extension    Ankle dorsiflexion    Ankle plantarflexion     (Blank rows = not tested)  LUMBAR SPECIAL TESTS:  {lumbar special test:25242}  FUNCTIONAL TESTS:  5xSTS: Forearm Plank: Squat:  GAIT: Distance walked: *** Assistive device utilized: {Assistive devices:23999} Level of assistance: {Levels of assistance:24026} Comments: ***  TODAY'S TREATMENT:  [facility] Adult PT Treatment:                                                DATE:  06/22/2022 Therapeutic Exercise: *** Manual Therapy: *** Neuromuscular re-ed: *** Therapeutic Activity: *** Modalities: *** Self Care: ***    PATIENT EDUCATION:  Education details: Pt educated on probable underlying pathophysiology, POC, prognosis, HEP, and FOTO Person educated: Patient Education method: Explanation, Demonstration, and Handouts Education comprehension: verbalized understanding and returned demonstration  HOME EXERCISE PROGRAM: ***  ASSESSMENT:  CLINICAL IMPRESSION: Patient is a *** y.o. *** who was seen today for physical therapy evaluation and treatment for ***.   OBJECTIVE IMPAIRMENTS: {opptimpairments:25111}.   ACTIVITY LIMITATIONS: {activitylimitations:27494}  PARTICIPATION LIMITATIONS: {participationrestrictions:25113}  PERSONAL FACTORS: {Personal factors:25162} are also affecting patient's functional outcome.   REHAB POTENTIAL: {rehabpotential:25112}  CLINICAL DECISION MAKING: {clinical decision making:25114}  EVALUATION COMPLEXITY: {Evaluation complexity:25115}   GOALS: Goals reviewed with patient? Yes  SHORT TERM GOALS: Target date: 07/20/2022  Pt will report understanding and adherence to initial HEP in order to promote independence in the management of primary impairments. Baseline: HEP provided at eval Goal status: INITIAL  2.  *** Baseline:  Goal status: {GOALSTATUS:25110}  3.  *** Baseline:  Goal status: {GOALSTATUS:25110}   LONG TERM GOALS: Target date: 08/17/2022  Pt will achieve a FOTO score of *** in order to demonstrate improved functional ability as it relates to the pt's primary impairments. Baseline: *** Goal status: INITIAL  2.  *** Baseline:  Goal status: {GOALSTATUS:25110}  3.  *** Baseline:  Goal status: {GOALSTATUS:25110}  4.  *** Baseline:  Goal status: {GOALSTATUS:25110}  5.  *** Baseline:  Goal status: {GOALSTATUS:25110}  6.  *** Baseline:  Goal status: {GOALSTATUS:25110}  PLAN:  PT  FREQUENCY: {rehab frequency:25116}  PT DURATION: {rehab duration:25117}  PLANNED INTERVENTIONS: {rehab planned interventions:25118::"Therapeutic exercises","Therapeutic activity","Neuromuscular re-education","Balance training","Gait training","Patient/Family education","Self Care","Joint mobilization"}.  PLAN FOR NEXT SESSION: Vanessa Huron, PT, DPT 06/22/22 8:59 AM

## 2022-06-23 ENCOUNTER — Ambulatory Visit: Payer: Medicare HMO | Admitting: Skilled Nursing Facility1

## 2022-06-24 ENCOUNTER — Other Ambulatory Visit: Payer: Self-pay | Admitting: Family Medicine

## 2022-06-24 ENCOUNTER — Other Ambulatory Visit (HOSPITAL_COMMUNITY): Payer: Self-pay

## 2022-06-24 ENCOUNTER — Other Ambulatory Visit: Payer: Self-pay | Admitting: Interventional Cardiology

## 2022-06-24 DIAGNOSIS — N3946 Mixed incontinence: Secondary | ICD-10-CM

## 2022-06-24 DIAGNOSIS — E1140 Type 2 diabetes mellitus with diabetic neuropathy, unspecified: Secondary | ICD-10-CM

## 2022-06-24 DIAGNOSIS — E039 Hypothyroidism, unspecified: Secondary | ICD-10-CM

## 2022-06-25 ENCOUNTER — Other Ambulatory Visit (HOSPITAL_COMMUNITY): Payer: Self-pay

## 2022-06-25 ENCOUNTER — Other Ambulatory Visit: Payer: Self-pay

## 2022-06-25 MED ORDER — OLMESARTAN-AMLODIPINE-HCTZ 40-5-25 MG PO TABS
1.0000 | ORAL_TABLET | Freq: Every day | ORAL | 0 refills | Status: DC
  Filled 2022-06-25: qty 15, 15d supply, fill #0

## 2022-06-28 ENCOUNTER — Other Ambulatory Visit: Payer: Self-pay

## 2022-06-29 ENCOUNTER — Other Ambulatory Visit: Payer: Self-pay | Admitting: Family Medicine

## 2022-06-29 DIAGNOSIS — E039 Hypothyroidism, unspecified: Secondary | ICD-10-CM

## 2022-06-29 NOTE — Telephone Encounter (Signed)
Medication Refill - Medication:  levothyroxine (SYNTHROID) 150 MCG tablet  dapagliflozin propanediol (FARXIGA) 5 MG TABS tablet   Has the patient contacted their pharmacy? No. (Agent: If no, request that the patient contact the pharmacy for the refill. If patient does not wish to contact the pharmacy document the reason why and proceed with request.) (Agent: If yes, when and what did the pharmacy advise?)  Preferred Pharmacy (with phone number or street name):  [facility], [place] Phone: [phone]  Fax: [phone]     Has the patient been seen for an appointment in the last year OR does the patient have an upcoming appointment? Yes.    Agent: Please be advised that RX refills may take up to 3 business days. We ask that you follow-up with your pharmacy.  *Please advise patient if she needs labs before her 09/13/22 appointment to refill Synthroid.

## 2022-06-29 NOTE — Telephone Encounter (Signed)
Requested medication (s) are due for refill today:yes  Requested medication (s) are on the active medication list: yes  Last refill:  05/10/22 and 04/09/21  Future visit scheduled: yes  Notes to clinic:  Unable to refill per protocol, courtesy refill already given, routing for provider approval.      Requested Prescriptions  Pending Prescriptions Disp Refills   levothyroxine (SYNTHROID) 150 MCG tablet 30 tablet 0     Endocrinology:  Hypothyroid Agents Failed - 06/29/2022  1:28 PM      Failed - TSH in normal range and within 360 days    TSH  Date Value Ref Range Status  07/13/2021 81.100 (H) 0.450 - 4.500 uIU/mL Final         Passed - Valid encounter within last 12 months    Recent Outpatient Visits           7 months ago Breast abscess   [place], Vernia Buff, NP   11 months ago Type 2 diabetes mellitus with chronic painful diabetic neuropathy ([place])   [facility] [place], Charlane Ferretti, MD   1 year ago [place], Charlane Ferretti, MD   1 year ago Type 2 diabetes mellitus with chronic painful diabetic neuropathy ([place])   [place], Charlane Ferretti, MD   1 year ago Type 2 diabetes mellitus with chronic painful diabetic neuropathy ([place])   [place], Charlane Ferretti, MD       Future Appointments             In 2 months Charlott Rakes, MD [place]             dapagliflozin propanediol (FARXIGA) 5 MG TABS tablet 30 tablet 3    Sig: Take 1 tablet (5 mg total) by mouth daily before breakfast.     Endocrinology:  Diabetes - SGLT2 Inhibitors Failed - 06/29/2022  1:28 PM      Failed - Cr in normal range and within 360 days    Creatinine, Ser  Date Value Ref Range Status  09/09/2021 1.50 (H) 0.44 - 1.00 mg/dL Final   Creatinine, Urine  Date Value Ref Range Status  09/13/2014  84.71 >20.0 mg/dL Final         Failed - HBA1C is between 0 and 7.9 and within 180 days    HbA1c, POC (controlled diabetic range)  Date Value Ref Range Status  07/13/2021 6.8 0.0 - 7.0 % Final         Failed - eGFR in normal range and within 360 days    GFR calc Af Amer  Date Value Ref Range Status  07/14/2020 60 >59 mL/min/1.73 Final    Comment:    **In accordance with recommendations from the NKF-ASN Task force,**   [facility] is in the process of updating its eGFR calculation to the   2021 CKD-EPI creatinine equation that estimates kidney function   without a race variable.    GFR calc non Af Amer  Date Value Ref Range Status  07/14/2020 52 (L) >59 mL/min/1.73 Final   eGFR  Date Value Ref Range Status  04/07/2021 62 >59 mL/min/1.73 Final         Failed - Valid encounter within last 6 months    Recent Outpatient Visits           7 months ago Breast abscess  [place], [place] W, NP   11 months ago Type 2 diabetes mellitus with chronic painful diabetic neuropathy [facility])    [facility] Charlott Rakes, MD   1 year ago [place], Charlane Ferretti, MD   1 year ago Type 2 diabetes mellitus with chronic painful diabetic neuropathy [facility])   [place], Charlane Ferretti, MD   1 year ago Type 2 diabetes mellitus with chronic painful diabetic neuropathy [facility])   [place], MD       Future Appointments             In 2 months Charlott Rakes, MD [place]

## 2022-07-16 DIAGNOSIS — R32 Unspecified urinary incontinence: Secondary | ICD-10-CM | POA: Diagnosis not present

## 2022-07-22 DIAGNOSIS — M19031 Primary osteoarthritis, right wrist: Secondary | ICD-10-CM | POA: Diagnosis not present

## 2022-07-22 DIAGNOSIS — R6889 Other general symptoms and signs: Secondary | ICD-10-CM | POA: Diagnosis not present

## 2022-07-29 DIAGNOSIS — R6889 Other general symptoms and signs: Secondary | ICD-10-CM | POA: Diagnosis not present

## 2022-07-29 DIAGNOSIS — M1712 Unilateral primary osteoarthritis, left knee: Secondary | ICD-10-CM | POA: Diagnosis not present

## 2022-08-13 DIAGNOSIS — R32 Unspecified urinary incontinence: Secondary | ICD-10-CM | POA: Diagnosis not present

## 2022-08-13 NOTE — Therapy (Unsigned)
OUTPATIENT PHYSICAL THERAPY THORACOLUMBAR EVALUATION   Patient Name: Caroline Williams MRN: [id number] DOB:1960/09/30, 62 y.o., female Today's Date: 08/16/2022  END OF SESSION:  PT End of Session - 08/16/22 1326     Visit Number 1    Number of Visits 12    Date for PT Re-Evaluation 09/27/22    Authorization Type [employer] MCR , MCD    PT Start Time 1328    PT Stop Time 1400    PT Time Calculation (min) 32 min    Activity Tolerance Patient tolerated treatment well    Behavior During Therapy WFL for tasks assessed/performed             Past Medical History:  Diagnosis Date   Anxiety    Arthritis    Asthma    Bipolar 1 disorder ([place])    Breast discharge    Breast lump    Breast pain    Chronic pain    on MS Contin and oxycodone   Depression    Diabetes mellitus    diet and exercise controlled   Escherichia coli (E. coli) infection    Fever    GERD (gastroesophageal reflux disease)    History of chest pain    History of kidney stones    History of knee replacement, total    Hypertension    Hypothyroidism    N&V (nausea and vomiting)    Peripheral neuropathy    Right foot no sensation   Pneumonia    2015,2014   Sciatica    Sleep apnea    does not use CPAP   Thyroid disease    Wears glasses    Past Surgical History:  Procedure Laterality Date   ABDOMINAL HYSTERECTOMY     partial   APPENDECTOMY     BIOPSY  03/14/2018   Procedure: BIOPSY;  Surgeon: Wilford Corner, MD;  Location: [facility];  Service: Endoscopy;;   BREAST DUCTAL SYSTEM EXCISION Right 08/31/2017   Procedure: RIGHT BREAST CENTRAL DUCT EXCISION;  Surgeon: Erroll Luna, MD;  Location: [place] [facility];  Service: General;  Laterality: Right;   BREAST EXCISIONAL BIOPSY Right    BREAST LUMPECTOMY     right   CARDIAC CATHETERIZATION     no significant CAD, nl LV function by 11/08/06 cath   CESAREAN SECTION     x4   COLONOSCOPY WITH PROPOFOL N/A 03/14/2018   Procedure: COLONOSCOPY WITH  PROPOFOL;  Surgeon: Wilford Corner, MD;  Location: [facility];  Service: Endoscopy;  Laterality: N/A;   GASTRIC ROUX-EN-Y N/A 11/25/2014   Procedure: LAPAROSCOPIC ROUX-EN-Y GASTRIC BYPASS WITH UPPER ENDOSCOPY;  Surgeon: Johnathan Hausen, MD;  Location: [facility];  Service: General;  Laterality: N/A;   HERNIA REPAIR     JOINT REPLACEMENT     KNEE SURGERY     right   [place]   POLYPECTOMY  03/14/2018   Procedure: POLYPECTOMY;  Surgeon: Wilford Corner, MD;  Location: [facility];  Service: Endoscopy;;   TOTAL KNEE REVISION  06/21/2012   Procedure: TOTAL KNEE REVISION;  Surgeon: Newt Minion, MD;  Location: [place] [facility];  Service: Orthopedics;  Laterality: Right;  Revision Right Total Knee Arthroplasty   Patient Active Problem List   Diagnosis Date Noted   Bipolar 1 disorder ([place])    Degeneration of lumbar intervertebral disc 08/07/2020   Essential (primary) hypertension 05/07/2020   Other chronic pain 02/14/2020   Elevated blood-pressure reading, without diagnosis of hypertension  02/13/2020   Hyperlipidemia 09/17/2019   COPD mixed type ([place]) 04/17/2019   Tobacco user 04/17/2019   Nocturnal hypoxemia 12/25/2018   Muscle weakness 12/05/2018   Change in bowel habits 03/14/2018   Chest pain 01/26/2016   Hallucination, drug-induced ([place]) 03/12/2015   Hypothyroidism 03/12/2015   Chronic low back pain 01/29/2015   Lumbar spondylosis 01/29/2015   Lap gastric bypass June 2016 11/25/2014   Diabetes mellitus with neuropathy ([address]) 06/20/2013   Chronic pain of right knee 06/20/2013   Morbid obesity ([place]) 07/05/2012    PCP: Charlott Rakes MD   REFERRING PROVIDER: Dr. Newman Pies   REFERRING DIAG: [phone] (ICD-10-CM) - Spinal stenosis of lumbar region with neurogenic claudication  Rationale for Evaluation and Treatment: Rehabilitation  THERAPY DIAG:  Other low back pain  Abnormal posture  Radiculopathy of lumbar region  ONSET DATE:  chronic   SUBJECTIVE:                                                                                                                                                                                           SUBJECTIVE STATEMENT: Patient has had pain for many years for pain significantly worse about to go to the point where she was bedridden.  She has significant pain in bilateral lumbar spine radiating into both legs.  She has fallen many times reporting her legs giving out.  Today she walks with a cane and has a walker at home.  She had to leave early as she had a crew coming to do work on her home.  She usually is unable to stand for any length of time due to back pain and leg pain.  She currently needs assistance with some of her housework but is usually able to do her ADLs independently.  She will be seeing Dr. Arnoldo Morale within the next couple of weeks to set a date for her back surgery she is unsure of the details of her surgery.  Her right knee limits her from walking as well she had a total knee replacement and a revision and is currently under the care of Dr. Alvan Dame.  PERTINENT HISTORY:  See above  diabetic  gastric bypass , chronic back pain for multiple injections  M48.062 (ICD-10-CM) - Spinal stenosis of lumbar region with neurogenic claudication  Newman Pies, MD  PAIN:  Are you having pain? Yes: NPRS scale: 5/10 Pain location: bilateral low back to bilateral LEs  Pain description: radiating, sore, throbbing   Aggravating factors: standing, walk Relieving factors: sitting , meds   PRECAUTIONS: Fall  WEIGHT BEARING RESTRICTIONS: No  FALLS:  Has patient fallen in last 6 months?  Yes. Number of falls 5  LIVING ENVIRONMENT: Lives with: lives with their partner Lives in: House/apartment Stairs: No has stairs to enter 4 steps  Has following equipment at home: Single point cane, walker   OCCUPATION: disabled, worked with the homeless case mgr.   PLOF: Independent with basic  ADLs, Needs assistance with homemaking, and Leisure: limited due to pain, would love to be able to play with grands   PATIENT GOALS: Pain relief  NEXT MD VISIT: 09/13/22 Dr. Margarita Rana   OBJECTIVE:   DIAGNOSTIC FINDINGS:  Stenosis at L4-L5  PATIENT SURVEYS:  FOTO emailed   SCREENING FOR RED FLAGS: Bowel or bladder incontinence: Yes: not sure  Spinal tumors: No Cauda equina syndrome: No Compression fracture: No Abdominal aneurysm: No  COGNITION: Overall cognitive status: WNL      SENSATION: WFL  MUSCLE LENGTH: Hamstrings: WFL  Thomas test: NT, appears tight   POSTURE: flexed trunk  and genu varus L knee  PALPATION: Does not tolerate   LUMBAR ROM:   AROM eval  Flexion WFL   Extension 75%  Right lateral flexion   Left lateral flexion   Right rotation 50% pain   Left rotation 50% pain    (Blank rows = not tested)  LOWER EXTREMITY ROM:     Active  Right eval Left eval  Hip flexion    Hip extension    Hip abduction    Hip adduction    Hip internal rotation WNL WNL  Hip external rotation WNL WNL   Knee flexion    Knee extension    Ankle dorsiflexion    Ankle plantarflexion    Ankle inversion    Ankle eversion     (Blank rows = not tested)  LOWER EXTREMITY MMT:    MMT Right eval Left eval  Hip flexion 3+ 4  Hip extension    Hip abduction    Hip adduction    Hip internal rotation    Hip external rotation    Knee flexion 4 5  Knee extension 4 5  Ankle dorsiflexion 4 5  Ankle plantarflexion    Ankle inversion    Ankle eversion     (Blank rows = not tested)  LUMBAR SPECIAL TESTS:  Straight leg raise test: Negative  FUNCTIONAL TESTS:  NT  GAIT: Distance walked: 75 feet Assistive device utilized: Single point cane and None Level of assistance: Modified independence Comments: NT   TODAY'S TREATMENT:                                                                                                                              DATE: 08/17/22     PATIENT EDUCATION:  Education details: POC, HEP, surgery/recovery, core and breathing  Person educated: Patient Education method: Explanation, Demonstration, and Handouts Education comprehension: verbalized understanding, verbal cues required, and needs further education  HOME EXERCISE PROGRAM: Access Code: [email or web address] URL: [email or web address] Date: 08/16/2022 Prepared by: Raeford Razor  Exercises - Supine  Lower Trunk Rotation  - 1 x daily - 7 x weekly - 2 sets - 10 reps - 10 hold - Supine Posterior Pelvic Tilt  - 1 x daily - 7 x weekly - 2 sets - 10 reps - 10 hold - Hooklying Single Knee to Chest Stretch  - 1 x daily - 7 x weekly - 1 sets - 5 reps - 30 hold  ASSESSMENT:  CLINICAL IMPRESSION: Patient is a 61y.o. female who was seen today for physical therapy evaluation and treatment for low back pain related to L4-5 spinal stenosis.  Apparently she is having surgery.  She does not know when.  Shortened session today as she got her "appointment time wrong" and had to leave early.   .   OBJECTIVE IMPAIRMENTS: Abnormal gait, decreased activity tolerance, decreased balance, decreased endurance, decreased mobility, difficulty walking, decreased ROM, decreased strength, increased fascial restrictions, impaired flexibility, improper body mechanics, postural dysfunction, obesity, and pain.   ACTIVITY LIMITATIONS: carrying, lifting, bending, sitting, standing, squatting, sleeping, stairs, transfers, bed mobility, continence, and locomotion level  PARTICIPATION LIMITATIONS: meal prep, cleaning, laundry, interpersonal relationship, shopping, and community activity  PERSONAL FACTORS: Past/current experiences, Time since onset of injury/illness/exacerbation, and 3+ comorbidities: obesity, diabetes, smoker  are also affecting patient's functional outcome.   REHAB POTENTIAL: Fair Pt likely having surgery  CLINICAL DECISION MAKING: Stable/uncomplicated  EVALUATION COMPLEXITY:  Low   GOALS:  LONG TERM GOALS: Target date: 08/16/22  Pt will be I with HEP for positioning and core, breathing Baseline: given on eval  Goal status: INITIAL  2.  Pt will be able to get up and down from the mat without increasing back pain  Baseline: needs cues, pain  Goal status: INITIAL  3.  Pt will be able to stand for ADLs 10 minutes min increase in back and leg pain Baseline: varies, pain increases standing 5 min  Goal status: INITIAL  4.  Patient will be able to walk in her home without increased pain for up to 10 minutes Baseline: varies, pain increase with walking 5 min  Goal status: INITIAL  5.  Patient will be screened for balance and goal set Baseline: unable on eval  Goal status: INITIAL  6.  Foto score TBA and goals set when able Baseline: emailed  Goal status: INITIAL  PLAN:  PT FREQUENCY: 1-2x/week  PT DURATION: 4 weeks-6 weeks   PLANNED INTERVENTIONS: Therapeutic exercises, Therapeutic activity, Neuromuscular re-education, Balance training, Gait training, Patient/Family education, Self Care, Joint mobilization, DME instructions, Electrical stimulation, Spinal mobilization, Cryotherapy, Moist heat, Manual therapy, and Re-evaluation.  PLAN FOR NEXT SESSION: Check home exercise program continue core stability ,breathing, conditioning and body mechanics, NuStep modalities for pain   Mayrani Khamis, PT 08/16/2022, 2:05 PM   Referring diagnosis? [id number] (ICD-10-CM) - Spinal stenosis of lumbar region with neurogenic claudication Treatment diagnosis? (if different than referring diagnosis) low back pain What was this (referring dx) caused by? '[]'$  Surgery '[x]'$  Fall '[x]'$  Ongoing issue '[]'$  Arthritis '[]'$  Other: ____________  Laterality: '[]'$  Rt '[]'$  Lt '[x]'$  Both  Check all possible CPT codes:  *CHOOSE 10 OR LESS*    '[]'$  97110 (Therapeutic Exercise)  '[]'$  92507 (SLP Treatment)  '[]'$  97112 (Neuro Re-ed)   '[]'$  92526 (Swallowing Treatment)   '[]'$  97116 (Gait Training)   '[]'$  [id number]  (Cognitive Training, 1st 15 minutes) '[]'$  97140 (Manual Therapy)   '[]'$  97130 (Cognitive Training, each add'l 15 minutes)  '[]'$  97164 (Re-evaluation)                              '[]'$   Other, List CPT Code ____________  '[]'$  [id number] (Therapeutic Activities)     '[]'$  [id number] (Self Care)   '[x]'$  All codes above (97110 - 97535)  '[]'$  97012 (Mechanical Traction)  '[x]'$  97014 (E-stim Unattended)  '[]'$  97032 (E-stim manual)  '[]'$  97033 (Ionto)  '[]'$  97035 (Ultrasound) '[]'$  97750 (Physical Performance Training) '[]'$  [id number] (Aquatic Therapy) '[]'$  97016 (Vasopneumatic Device) '[]'$  [id number] (Paraffin) '[]'$  97034 (Contrast Bath) '[]'$  97597 (Wound Care 1st 20 sq cm) '[]'$  97598 (Wound Care each add'l 20 sq cm) '[]'$  97760 (Orthotic Fabrication, Fitting, Training Initial) '[]'$  [id number] (Prosthetic Management and Training Initial) '[]'$  [phone] (Orthotic or Prosthetic Training/ Modification Subsequent)  Raeford Razor, PT 08/17/22 9:50 AM Phone: [phone] Fax: [phone]

## 2022-08-16 ENCOUNTER — Ambulatory Visit: Payer: Medicare HMO | Attending: Physical Medicine and Rehabilitation | Admitting: Physical Therapy

## 2022-08-16 DIAGNOSIS — M5416 Radiculopathy, lumbar region: Secondary | ICD-10-CM | POA: Diagnosis not present

## 2022-08-16 DIAGNOSIS — R293 Abnormal posture: Secondary | ICD-10-CM | POA: Diagnosis not present

## 2022-08-16 DIAGNOSIS — M5459 Other low back pain: Secondary | ICD-10-CM

## 2022-08-17 DIAGNOSIS — Z0100 Encounter for examination of eyes and vision without abnormal findings: Secondary | ICD-10-CM | POA: Diagnosis not present

## 2022-08-17 DIAGNOSIS — R6889 Other general symptoms and signs: Secondary | ICD-10-CM | POA: Diagnosis not present

## 2022-08-17 DIAGNOSIS — E119 Type 2 diabetes mellitus without complications: Secondary | ICD-10-CM | POA: Diagnosis not present

## 2022-08-17 LAB — HM DIABETES EYE EXAM

## 2022-08-19 ENCOUNTER — Other Ambulatory Visit: Payer: Self-pay

## 2022-08-23 ENCOUNTER — Telehealth: Payer: Self-pay | Admitting: Family Medicine

## 2022-08-23 NOTE — Telephone Encounter (Signed)
Contacted Caroline Williams to schedule their annual wellness visit. Call back at later date: 08/24/22 pt at grocery store  [place] direct phone # [phone]

## 2022-08-26 ENCOUNTER — Telehealth: Payer: Self-pay | Admitting: Emergency Medicine

## 2022-08-26 NOTE — Telephone Encounter (Signed)
Copied from [place] [id number]. Topic: General - Other >> Aug 26, 2022  3:14 PM Dominique A wrote: Reason for CRM: Pt states that she received a phone call from [facility] a couple of minutes ago and missed the phone call. No notes in Epic regarding anyone reaching out to the patient today. Please have someone reach back out to the pt.

## 2022-08-27 NOTE — Telephone Encounter (Signed)
[employer] paperwork was received for patient and telephone visit was needed, but after reviewing paperwork no visit is needed. Form will be faxed once completed by PCP

## 2022-09-02 DIAGNOSIS — R6889 Other general symptoms and signs: Secondary | ICD-10-CM | POA: Diagnosis not present

## 2022-09-02 DIAGNOSIS — M1811 Unilateral primary osteoarthritis of first carpometacarpal joint, right hand: Secondary | ICD-10-CM | POA: Diagnosis not present

## 2022-09-08 NOTE — Therapy (Signed)
OUTPATIENT PHYSICAL THERAPY TREATMENT NOTE   Patient Name: Caroline Williams MRN: [id number] DOB:Nov 06, 1960, 62 y.o., female Today's Date: 09/09/2022  PCP: Charlott Rakes MD  REFERRING PROVIDER: Dr. Newman Pies   END OF SESSION:   PT End of Session - 09/09/22 1150     Visit Number 2    Number of Visits 12    Date for PT Re-Evaluation 09/27/22    Authorization Type [employer] MCR , MCD    PT Start Time 1150    PT Stop Time 1230    PT Time Calculation (min) 40 min    Activity Tolerance Patient tolerated treatment well    Behavior During Therapy WFL for tasks assessed/performed             Past Medical History:  Diagnosis Date   Anxiety    Arthritis    Asthma    Bipolar 1 disorder ([place])    Breast discharge    Breast lump    Breast pain    Chronic pain    on MS Contin and oxycodone   Depression    Diabetes mellitus    diet and exercise controlled   Escherichia coli (E. coli) infection    Fever    GERD (gastroesophageal reflux disease)    History of chest pain    History of kidney stones    History of knee replacement, total    Hypertension    Hypothyroidism    N&V (nausea and vomiting)    Peripheral neuropathy    Right foot no sensation   Pneumonia    2015,2014   Sciatica    Sleep apnea    does not use CPAP   Thyroid disease    Wears glasses    Past Surgical History:  Procedure Laterality Date   ABDOMINAL HYSTERECTOMY     partial   APPENDECTOMY     BIOPSY  03/14/2018   Procedure: BIOPSY;  Surgeon: Wilford Corner, MD;  Location: [facility];  Service: Endoscopy;;   BREAST DUCTAL SYSTEM EXCISION Right 08/31/2017   Procedure: RIGHT BREAST CENTRAL DUCT EXCISION;  Surgeon: Erroll Luna, MD;  Location: [facility];  Service: General;  Laterality: Right;   BREAST EXCISIONAL BIOPSY Right    BREAST LUMPECTOMY     right   CARDIAC CATHETERIZATION     no significant CAD, nl LV function by 11/08/06 cath   CESAREAN SECTION     x4    COLONOSCOPY WITH PROPOFOL N/A 03/14/2018   Procedure: COLONOSCOPY WITH PROPOFOL;  Surgeon: Wilford Corner, MD;  [facility];  Service: Endoscopy;  Laterality: N/A;   GASTRIC ROUX-EN-Y N/A 11/25/2014   Procedure: LAPAROSCOPIC ROUX-EN-Y GASTRIC BYPASS WITH UPPER ENDOSCOPY;  Surgeon: Johnathan Hausen, MD;  Location: [facility];  Service: General;  Laterality: N/A;   HERNIA REPAIR     JOINT REPLACEMENT     KNEE SURGERY     right   [place]   POLYPECTOMY  03/14/2018   Procedure: POLYPECTOMY;  Surgeon: Wilford Corner, MD;  [facility];  Service: Endoscopy;;   TOTAL KNEE REVISION  06/21/2012   Procedure: TOTAL KNEE REVISION;  Surgeon: Newt Minion, MD;  Location: [facility];  Service: Orthopedics;  Laterality: Right;  Revision Right Total Knee Arthroplasty   Patient Active Problem List   Diagnosis Date Noted   Bipolar 1 disorder ([place])    Degeneration of lumbar intervertebral disc 08/07/2020   Essential (primary) hypertension 05/07/2020  Other chronic pain 02/14/2020   Elevated blood-pressure reading, without diagnosis of hypertension 02/13/2020   Hyperlipidemia 09/17/2019   COPD mixed type ([place]) 04/17/2019   Tobacco user 04/17/2019   Nocturnal hypoxemia 12/25/2018   Muscle weakness 12/05/2018   Change in bowel habits 03/14/2018   Chest pain 01/26/2016   Hallucination, drug-induced ([place]) 03/12/2015   Hypothyroidism 03/12/2015   Chronic low back pain 01/29/2015   Lumbar spondylosis 01/29/2015   Lap gastric bypass June 2016 11/25/2014   Diabetes mellitus with neuropathy ([place]) 06/20/2013   Chronic pain of right knee 06/20/2013   Morbid obesity ([place]) 07/05/2012    REFERRING DIAG: [id number] (ICD-10-CM) - Spinal stenosis of lumbar region with neurogenic claudication   THERAPY DIAG:  Other low back pain  Abnormal posture  Radiculopathy of lumbar region  ONSET DATE: chronic    SUBJECTIVE:                                                                                                                                                                                             SUBJECTIVE STATEMENT: Pt reports she is feeling better than expected after all the rain yesterday. Pt notes limited completion of her HEP.   PERTINENT HISTORY:  See above  diabetic  gastric bypass , chronic back pain for multiple injections   M48.062 (ICD-10-CM) - Spinal stenosis of lumbar region with neurogenic claudication   Newman Pies, MD   PAIN:  Are you having pain? Yes: NPRS scale: 4-5/10 Pain location: bilateral low back to bilateral LEs  Pain description: radiating, sore, throbbing   Aggravating factors: standing, walk Relieving factors: sitting , meds    PRECAUTIONS: Fall   WEIGHT BEARING RESTRICTIONS: No   FALLS:  Has patient fallen in last 6 months? Yes. Number of falls 5   LIVING ENVIRONMENT: Lives with: lives with their partner Lives in: House/apartment Stairs: No has stairs to enter 4 steps  Has following equipment at home: Single point cane, walker    OCCUPATION: disabled, worked with the homeless case mgr.    PLOF: Independent with basic ADLs, Needs assistance with homemaking, and Leisure: limited due to pain, would love to be able to play with grands    PATIENT GOALS: Pain relief   NEXT MD VISIT: 09/13/22 Dr. Margarita Rana    OBJECTIVE: (objective measures completed at initial evaluation unless otherwise dated)   DIAGNOSTIC FINDINGS:  Stenosis at L4-L5   PATIENT SURVEYS:  FOTO emailed    SCREENING FOR RED FLAGS: Bowel or bladder incontinence: Yes: not sure  Spinal tumors: No Cauda equina syndrome: No Compression fracture: No Abdominal aneurysm: No   COGNITION: Overall  cognitive status: WNL                              SENSATION: WFL   MUSCLE LENGTH: Hamstrings: WFL  Thomas test: NT, appears tight    POSTURE: flexed trunk  and genu varus L knee   PALPATION: Does not tolerate    LUMBAR  ROM:    AROM eval  Flexion WFL   Extension 75%  Right lateral flexion    Left lateral flexion    Right rotation 50% pain   Left rotation 50% pain    (Blank rows = not tested)   LOWER EXTREMITY ROM:      Active  Right eval Left eval  Hip flexion      Hip extension      Hip abduction      Hip adduction      Hip internal rotation WNL WNL  Hip external rotation WNL WNL   Knee flexion      Knee extension      Ankle dorsiflexion      Ankle plantarflexion      Ankle inversion      Ankle eversion       (Blank rows = not tested)   LOWER EXTREMITY MMT:     MMT Right eval Left eval  Hip flexion 3+ 4  Hip extension      Hip abduction      Hip adduction      Hip internal rotation      Hip external rotation      Knee flexion 4 5  Knee extension 4 5  Ankle dorsiflexion 4 5  Ankle plantarflexion      Ankle inversion      Ankle eversion       (Blank rows = not tested)   LUMBAR SPECIAL TESTS:  Straight leg raise test: Negative   FUNCTIONAL TESTS:  NT   GAIT: Distance walked: 75 feet Assistive device utilized: Single point cane and None Level of assistance: Modified independence Comments: NT    TODAY'S TREATMENT:  [facility] Adult PT Treatment:                                                DATE: 09/09/22 Therapeutic Exercise: Nustep 8 min L4 UE/LE SKTC x3 20 sec LTR in comfortable range x10 10" PPT 2x10 3" Bridge small range x15 3" HL clam x15 3" HL marching x15 3" Updated HEP                                                                                                                              DATE: 08/17/22      PATIENT EDUCATION:  Education details: POC, HEP, surgery/recovery, core and breathing  Person educated: Patient Education method:  Explanation, Demonstration, and Handouts Education comprehension: verbalized understanding, verbal cues required, and needs further education   HOME EXERCISE PROGRAM: Access Code: [email or web address] URL:  [email or web address] Date: 09/09/2022 Prepared by: Gar Ponto  Exercises - Supine Lower Trunk Rotation  - 1 x daily - 7 x weekly - 2 sets - 10 reps - 10 hold - Hooklying Single Knee to Chest Stretch  - 1 x daily - 7 x weekly - 1 sets - 5 reps - 30 hold - Supine Posterior Pelvic Tilt  - 1 x daily - 7 x weekly - 2 sets - 10 reps - 10 hold - Supine Bridge  - 1 x daily - 7 x weekly - 2 sets - 10 reps - 3 hold - Hooklying Clamshell with Resistance  - 1 x daily - 7 x weekly - 2 sets - 10 reps - 3 hold - Supine March  - 1 x daily - 7 x weekly - 2 sets - 10 reps - 3 hold   ASSESSMENT:   CLINICAL IMPRESSION: PT was completed for lumbopelvic flexibility and strengthening. Attempted SLR's, but pt was not able to complete. Pt returned demonstration of therex which were added to her HEP. Pt was encouraged to complete her HEP daily to promote strengthening. Pt tolerated PT today without adverse effects. Pt will continue to benefit from skilled PT to address impairments for improved function. .  OBJECTIVE IMPAIRMENTS: Abnormal gait, decreased activity tolerance, decreased balance, decreased endurance, decreased mobility, difficulty walking, decreased ROM, decreased strength, increased fascial restrictions, impaired flexibility, improper body mechanics, postural dysfunction, obesity, and pain.    ACTIVITY LIMITATIONS: carrying, lifting, bending, sitting, standing, squatting, sleeping, stairs, transfers, bed mobility, continence, and locomotion level   PARTICIPATION LIMITATIONS: meal prep, cleaning, laundry, interpersonal relationship, shopping, and community activity   PERSONAL FACTORS: Past/current experiences, Time since onset of injury/illness/exacerbation, and 3+ comorbidities: obesity, diabetes, smoker  are also affecting patient's functional outcome.    REHAB POTENTIAL: Fair Pt likely having surgery   CLINICAL DECISION MAKING: Stable/uncomplicated   EVALUATION COMPLEXITY: Low      GOALS:   LONG TERM GOALS: Target date: 08/16/22   Pt will be I with HEP for positioning and core, breathing Baseline: given on eval  Goal status: Ongoing   2.  Pt will be able to get up and down from the mat without increasing back pain  Baseline: needs cues, pain  Goal status: INITIAL   3.  Pt will be able to stand for ADLs 10 minutes min increase in back and leg pain Baseline: varies, pain increases standing 5 min  Goal status: INITIAL   4.  Patient will be able to walk in her home without increased pain for up to 10 minutes Baseline: varies, pain increase with walking 5 min  Goal status: INITIAL   5.  Patient will be screened for balance and goal set Baseline: unable on eval  Goal status: INITIAL   6.  Foto score TBA and goals set when able Baseline: emailed  Goal status: INITIAL   PLAN:   PT FREQUENCY: 1-2x/week   PT DURATION: 4 weeks-6 weeks    PLANNED INTERVENTIONS: Therapeutic exercises, Therapeutic activity, Neuromuscular re-education, Balance training, Gait training, Patient/Family education, Self Care, Joint mobilization, DME instructions, Electrical stimulation, Spinal mobilization, Cryotherapy, Moist heat, Manual therapy, and Re-evaluation.   PLAN FOR NEXT SESSION: Check home exercise program continue core stability ,breathing, conditioning and body mechanics, NuStep modalities for pain  Gar Ponto MS, PT 09/09/22 1:03 PM

## 2022-09-09 ENCOUNTER — Ambulatory Visit: Payer: Medicare HMO

## 2022-09-09 DIAGNOSIS — R293 Abnormal posture: Secondary | ICD-10-CM | POA: Diagnosis not present

## 2022-09-09 DIAGNOSIS — M5416 Radiculopathy, lumbar region: Secondary | ICD-10-CM

## 2022-09-09 DIAGNOSIS — M5459 Other low back pain: Secondary | ICD-10-CM | POA: Diagnosis not present

## 2022-09-09 DIAGNOSIS — F319 Bipolar disorder, unspecified: Secondary | ICD-10-CM | POA: Diagnosis not present

## 2022-09-09 DIAGNOSIS — R6889 Other general symptoms and signs: Secondary | ICD-10-CM | POA: Diagnosis not present

## 2022-09-09 DIAGNOSIS — J449 Chronic obstructive pulmonary disease, unspecified: Secondary | ICD-10-CM | POA: Diagnosis not present

## 2022-09-12 NOTE — Therapy (Unsigned)
OUTPATIENT PHYSICAL THERAPY TREATMENT NOTE   Patient Name: Caroline Williams MRN: [id number] DOB:06-14-1961, 62 y.o., female Today's Date: 09/13/2022  PCP: Charlott Rakes MD  REFERRING PROVIDER: Dr. Newman Pies   END OF SESSION:   PT End of Session - 09/13/22 1551     Visit Number 3    Number of Visits 12    Date for PT Re-Evaluation 09/27/22    Authorization Type [employer] MCR , MCD    PT Start Time [id number]    PT Stop Time [id number]    PT Time Calculation (min) 49 min    Activity Tolerance Patient tolerated treatment well    Behavior During Therapy WFL for tasks assessed/performed              Past Medical History:  Diagnosis Date   Anxiety    Arthritis    Asthma    Bipolar 1 disorder    Breast discharge    Breast lump    Breast pain    Chronic pain    on MS Contin and oxycodone   Depression    Diabetes mellitus    diet and exercise controlled   Escherichia coli (E. coli) infection    Fever    GERD (gastroesophageal reflux disease)    History of chest pain    History of kidney stones    History of knee replacement, total    Hypertension    Hypothyroidism    N&V (nausea and vomiting)    Peripheral neuropathy    Right foot no sensation   Pneumonia    2015,2014   Sciatica    Sleep apnea    does not use CPAP   Thyroid disease    Wears glasses    Past Surgical History:  Procedure Laterality Date   ABDOMINAL HYSTERECTOMY     partial   APPENDECTOMY     BIOPSY  03/14/2018   Procedure: BIOPSY;  Surgeon: Wilford Corner, MD;  [facility];  Service: Endoscopy;;   BREAST DUCTAL SYSTEM EXCISION Right 08/31/2017   Procedure: RIGHT BREAST CENTRAL DUCT EXCISION;  Surgeon: Erroll Luna, MD;  Location: [place] [facility];  Service: General;  Laterality: Right;   BREAST EXCISIONAL BIOPSY Right    BREAST LUMPECTOMY     right   CARDIAC CATHETERIZATION     no significant CAD, nl LV function by 11/08/06 cath   CESAREAN SECTION     x4   COLONOSCOPY  WITH PROPOFOL N/A 03/14/2018   Procedure: COLONOSCOPY WITH PROPOFOL;  Surgeon: Wilford Corner, MD;  [facility];  Service: Endoscopy;  Laterality: N/A;   GASTRIC ROUX-EN-Y N/A 11/25/2014   Procedure: LAPAROSCOPIC ROUX-EN-Y GASTRIC BYPASS WITH UPPER ENDOSCOPY;  Surgeon: Johnathan Hausen, MD;  [facility];  Service: General;  Laterality: N/A;   HERNIA REPAIR     JOINT REPLACEMENT     KNEE SURGERY     right   [place]   POLYPECTOMY  03/14/2018   Procedure: POLYPECTOMY;  Surgeon: Wilford Corner, MD;  [facility];  Service: Endoscopy;;   TOTAL KNEE REVISION  06/21/2012   Procedure: TOTAL KNEE REVISION;  Surgeon: Newt Minion, MD;  Location: [facility];  Service: Orthopedics;  Laterality: Right;  Revision Right Total Knee Arthroplasty   Patient Active Problem List   Diagnosis Date Noted   Bipolar 1 disorder    Degeneration of lumbar intervertebral disc 08/07/2020   Essential (primary) hypertension 05/07/2020   Other  chronic pain 02/14/2020   Elevated blood-pressure reading, without diagnosis of hypertension 02/13/2020   Hyperlipidemia 09/17/2019   COPD mixed type 04/17/2019   Tobacco user 04/17/2019   Nocturnal hypoxemia 12/25/2018   Muscle weakness 12/05/2018   Change in bowel habits 03/14/2018   Chest pain 01/26/2016   Hallucination, drug-induced 03/12/2015   Hypothyroidism 03/12/2015   Chronic low back pain 01/29/2015   Lumbar spondylosis 01/29/2015   Lap gastric bypass June 2016 11/25/2014   Diabetes mellitus with neuropathy 06/20/2013   Chronic pain of right knee 06/20/2013   Morbid obesity 07/05/2012    REFERRING DIAG: M48.062 (ICD-10-CM) - Spinal stenosis of lumbar region with neurogenic claudication   THERAPY DIAG:  Other low back pain  Abnormal posture  Radiculopathy of lumbar region  ONSET DATE: chronic    SUBJECTIVE:                                                                                                                                                                                             SUBJECTIVE STATEMENT: Last week was rough.  I was very sore and had to hold the furniture to walk.     PERTINENT HISTORY:  See above  diabetic  gastric bypass , chronic back pain for multiple injections   M48.062 (ICD-10-CM) - Spinal stenosis of lumbar region with neurogenic claudication   Newman Pies, MD   PAIN:  Are you having pain? Yes: NPRS scale: 6/10 Pain location: bilateral low back to bilateral LEs  Pain description: radiating, sore, throbbing   Aggravating factors: standing, walk Relieving factors: sitting , meds    PRECAUTIONS: Fall   WEIGHT BEARING RESTRICTIONS: No   FALLS:  Has patient fallen in last 6 months? Yes. Number of falls 5   LIVING ENVIRONMENT: Lives with: lives with their partner Lives in: House/apartment Stairs: No has stairs to enter 4 steps  Has following equipment at home: Single point cane, walker    OCCUPATION: disabled, worked with the homeless case mgr.    PLOF: Independent with basic ADLs, Needs assistance with homemaking, and Leisure: limited due to pain, would love to be able to play with grands    PATIENT GOALS: Pain relief   NEXT MD VISIT: 09/13/22 Dr. Margarita Rana    OBJECTIVE: (objective measures completed at initial evaluation unless otherwise dated)   DIAGNOSTIC FINDINGS:  Stenosis at L4-L5   PATIENT SURVEYS:  FOTO emailed    SCREENING FOR RED FLAGS: Bowel or bladder incontinence: Yes: not sure  Spinal tumors: No Cauda equina syndrome: No Compression fracture: No Abdominal aneurysm: No   COGNITION: Overall cognitive status: WNL  SENSATION: WFL   MUSCLE LENGTH: Hamstrings: WFL  Thomas test: NT, appears tight    POSTURE: flexed trunk  and genu varus L knee   PALPATION: Does not tolerate    LUMBAR ROM:    AROM eval  Flexion WFL   Extension 75%  Right lateral flexion    Left  lateral flexion    Right rotation 50% pain   Left rotation 50% pain    (Blank rows = not tested)   LOWER EXTREMITY ROM:      Active  Right eval Left eval  Hip flexion      Hip extension      Hip abduction      Hip adduction      Hip internal rotation WNL WNL  Hip external rotation WNL WNL   Knee flexion      Knee extension      Ankle dorsiflexion      Ankle plantarflexion      Ankle inversion      Ankle eversion       (Blank rows = not tested)   LOWER EXTREMITY MMT:     MMT Right eval Left eval  Hip flexion 3+ 4  Hip extension      Hip abduction      Hip adduction      Hip internal rotation      Hip external rotation      Knee flexion 4 5  Knee extension 4 5  Ankle dorsiflexion 4 5  Ankle plantarflexion      Ankle inversion      Ankle eversion       (Blank rows = not tested)   LUMBAR SPECIAL TESTS:  Straight leg raise test: Negative   FUNCTIONAL TESTS:  NT   GAIT: Distance walked: 75 feet Assistive device utilized: Single point cane and None Level of assistance: Modified independence Comments: NT    TODAY'S TREATMENT:   [facility] Adult PT Treatment:                                                DATE: 09/13/22 Therapeutic Exercise: NuStep L6 UE and LE for 6 min  Supine PPT x 10 with core focus Oblique isometric ball press in supine/hooklying  Knee to chest x 2  Mini bridge, cues needed  Used dynadisc under pelvis for march and clam   Modalities: IFC for 12 min to see if Home TENS unit is an option for her  28 Mv per tolerance lumbar central spine  Self Care: TENS use and contraindication/indications        [facility] Adult PT Treatment:                                                DATE: 09/09/22 Therapeutic Exercise: Nustep 8 min L4 UE/LE SKTC x3 20 sec LTR in comfortable range x10 10" PPT 2x10 3" Bridge small range x15 3" HL clam x15 3" HL marching x15 3" Updated HEP  DATE: 08/17/22      PATIENT EDUCATION:  Education details: POC, HEP, surgery/recovery, core and breathing  Person educated: Patient Education method: Explanation, Demonstration, and Handouts Education comprehension: verbalized understanding, verbal cues required, and needs further education   HOME EXERCISE PROGRAM: Access Code: [email or web address] URL: [email or web address] Date: 09/09/2022 Prepared by: Gar Ponto  Exercises - Supine Lower Trunk Rotation  - 1 x daily - 7 x weekly - 2 sets - 10 reps - 10 hold - Hooklying Single Knee to Chest Stretch  - 1 x daily - 7 x weekly - 1 sets - 5 reps - 30 hold - Supine Posterior Pelvic Tilt  - 1 x daily - 7 x weekly - 2 sets - 10 reps - 10 hold - Supine Bridge  - 1 x daily - 7 x weekly - 2 sets - 10 reps - 3 hold - Hooklying Clamshell with Resistance  - 1 x daily - 7 x weekly - 2 sets - 10 reps - 3 hold - Supine March  - 1 x daily - 7 x weekly - 2 sets - 10 reps - 3 hold   ASSESSMENT:   CLINICAL IMPRESSION: Patient is showing good effort with exercises but did have increased pain/soreness after last session.  She sees Dr. Arnoldo Morale tomorrow and would like to postpone her surgery if possible. Trial of TENS for pain mgmt.  .  OBJECTIVE IMPAIRMENTS: Abnormal gait, decreased activity tolerance, decreased balance, decreased endurance, decreased mobility, difficulty walking, decreased ROM, decreased strength, increased fascial restrictions, impaired flexibility, improper body mechanics, postural dysfunction, obesity, and pain.    ACTIVITY LIMITATIONS: carrying, lifting, bending, sitting, standing, squatting, sleeping, stairs, transfers, bed mobility, continence, and locomotion level   PARTICIPATION LIMITATIONS: meal prep, cleaning, laundry, interpersonal relationship, shopping, and community activity   PERSONAL FACTORS: Past/current experiences, Time since onset of injury/illness/exacerbation, and 3+  comorbidities: obesity, diabetes, smoker  are also affecting patient's functional outcome.    REHAB POTENTIAL: Fair Pt likely having surgery   CLINICAL DECISION MAKING: Stable/uncomplicated   EVALUATION COMPLEXITY: Low     GOALS:   LONG TERM GOALS: Target date: 08/16/22   Pt will be I with HEP for positioning and core, breathing Baseline: given on eval  Goal status: Ongoing   2.  Pt will be able to get up and down from the mat without increasing back pain  Baseline: needs cues, pain  Goal status: INITIAL   3.  Pt will be able to stand for ADLs 10 minutes min increase in back and leg pain Baseline: varies, pain increases standing 5 min  Goal status: INITIAL   4.  Patient will be able to walk in her home without increased pain for up to 10 minutes Baseline: varies, pain increase with walking 5 min  Goal status: INITIAL   5.  Patient will be screened for balance and goal set Baseline: unable on eval  Goal status: INITIAL   6.  Foto score TBA and goals set when able Baseline: emailed  Goal status: INITIAL   PLAN:   PT FREQUENCY: 1-2x/week   PT DURATION: 4 weeks-6 weeks    PLANNED INTERVENTIONS: Therapeutic exercises, Therapeutic activity, Neuromuscular re-education, Balance training, Gait training, Patient/Family education, Self Care, Joint mobilization, DME instructions, Electrical stimulation, Spinal mobilization, Cryotherapy, Moist heat, Manual therapy, and Re-evaluation.   PLAN FOR NEXT SESSION: Check home exercise program continue core stability ,breathing, conditioning and body mechanics, NuStep modalities for pain Raeford Razor, PT 09/13/22 4:31 PM Phone: [phone] Fax: [phone]

## 2022-09-13 ENCOUNTER — Ambulatory Visit: Payer: Medicare Other | Admitting: Family Medicine

## 2022-09-13 ENCOUNTER — Ambulatory Visit: Payer: Medicare HMO | Attending: Physical Medicine and Rehabilitation | Admitting: Physical Therapy

## 2022-09-13 ENCOUNTER — Encounter: Payer: Self-pay | Admitting: Physical Therapy

## 2022-09-13 DIAGNOSIS — M5416 Radiculopathy, lumbar region: Secondary | ICD-10-CM | POA: Diagnosis not present

## 2022-09-13 DIAGNOSIS — M5459 Other low back pain: Secondary | ICD-10-CM

## 2022-09-13 DIAGNOSIS — R6889 Other general symptoms and signs: Secondary | ICD-10-CM | POA: Diagnosis not present

## 2022-09-13 DIAGNOSIS — R293 Abnormal posture: Secondary | ICD-10-CM | POA: Diagnosis not present

## 2022-09-13 DIAGNOSIS — R32 Unspecified urinary incontinence: Secondary | ICD-10-CM | POA: Diagnosis not present

## 2022-09-13 NOTE — Patient Instructions (Signed)

## 2022-09-24 DIAGNOSIS — R6889 Other general symptoms and signs: Secondary | ICD-10-CM | POA: Diagnosis not present

## 2022-09-28 ENCOUNTER — Ambulatory Visit: Payer: Medicare HMO | Admitting: Physical Therapy

## 2022-09-29 DIAGNOSIS — F1190 Opioid use, unspecified, uncomplicated: Secondary | ICD-10-CM | POA: Diagnosis not present

## 2022-09-29 DIAGNOSIS — F423 Hoarding disorder: Secondary | ICD-10-CM | POA: Diagnosis not present

## 2022-09-29 DIAGNOSIS — M1712 Unilateral primary osteoarthritis, left knee: Secondary | ICD-10-CM | POA: Diagnosis not present

## 2022-09-29 DIAGNOSIS — M5416 Radiculopathy, lumbar region: Secondary | ICD-10-CM | POA: Diagnosis not present

## 2022-09-29 DIAGNOSIS — F4001 Agoraphobia with panic disorder: Secondary | ICD-10-CM | POA: Diagnosis not present

## 2022-09-29 DIAGNOSIS — R6889 Other general symptoms and signs: Secondary | ICD-10-CM | POA: Diagnosis not present

## 2022-09-29 DIAGNOSIS — F900 Attention-deficit hyperactivity disorder, predominantly inattentive type: Secondary | ICD-10-CM | POA: Diagnosis not present

## 2022-09-29 DIAGNOSIS — G894 Chronic pain syndrome: Secondary | ICD-10-CM | POA: Diagnosis not present

## 2022-09-29 DIAGNOSIS — F250 Schizoaffective disorder, bipolar type: Secondary | ICD-10-CM | POA: Diagnosis not present

## 2022-09-29 DIAGNOSIS — M48062 Spinal stenosis, lumbar region with neurogenic claudication: Secondary | ICD-10-CM | POA: Diagnosis not present

## 2022-09-30 ENCOUNTER — Ambulatory Visit: Payer: Medicare HMO

## 2022-10-05 ENCOUNTER — Ambulatory Visit: Payer: Medicare HMO | Admitting: Physical Therapy

## 2022-10-06 NOTE — Therapy (Unsigned)
OUTPATIENT PHYSICAL THERAPY TREATMENT NOTE   Patient Name: Caroline Williams MRN: [id number] DOB:07-01-1960, 62 y.o., female Today's Date: 10/07/2022  PCP: Hoy Register MD  REFERRING PROVIDER: Dr. Tressie Stalker   END OF SESSION:   PT End of Session - 10/07/22 0929     Visit Number 4    Number of Visits 12    Date for PT Re-Evaluation 11/04/22    Authorization Type [employer] MCR , MCD    Authorization - Visit Number 3    Authorization - Number of Visits 12    PT Start Time 0933    PT Stop Time 1026    PT Time Calculation (min) 53 min    Activity Tolerance Patient limited by pain    Behavior During Therapy WFL for tasks assessed/performed               Past Medical History:  Diagnosis Date   Anxiety    Arthritis    Asthma    Bipolar 1 disorder    Breast discharge    Breast lump    Breast pain    Chronic pain    on MS Contin and oxycodone   Depression    Diabetes mellitus    diet and exercise controlled   Escherichia coli (E. coli) infection    Fever    GERD (gastroesophageal reflux disease)    History of chest pain    History of kidney stones    History of knee replacement, total    Hypertension    Hypothyroidism    N&V (nausea and vomiting)    Peripheral neuropathy    Right foot no sensation   Pneumonia    2015,2014   Sciatica    Sleep apnea    does not use CPAP   Thyroid disease    Wears glasses    Past Surgical History:  Procedure Laterality Date   ABDOMINAL HYSTERECTOMY     partial   APPENDECTOMY     BIOPSY  03/14/2018   Procedure: BIOPSY;  Surgeon: Charlott Rakes, MD;  Location: [facility];  Service: Endoscopy;;   BREAST DUCTAL SYSTEM EXCISION Right 08/31/2017   Procedure: RIGHT BREAST CENTRAL DUCT EXCISION;  Surgeon: Harriette Bouillon, MD;  Location: [facility];  Service: General;  Laterality: Right;   BREAST EXCISIONAL BIOPSY Right    BREAST LUMPECTOMY     right   CARDIAC CATHETERIZATION     no significant CAD, nl LV  function by 11/08/06 cath   CESAREAN SECTION     x4   COLONOSCOPY WITH PROPOFOL N/A 03/14/2018   Procedure: COLONOSCOPY WITH PROPOFOL;  Surgeon: Charlott Rakes, MD;  Location: [facility];  Service: Endoscopy;  Laterality: N/A;   GASTRIC ROUX-EN-Y N/A 11/25/2014   Procedure: LAPAROSCOPIC ROUX-EN-Y GASTRIC BYPASS WITH UPPER ENDOSCOPY;  Surgeon: Luretha Murphy, MD;  Location: [facility];  Service: General;  Laterality: N/A;   HERNIA REPAIR     JOINT REPLACEMENT     KNEE SURGERY     right   MOUTH SURGERY     MYOMECTOMY ABDOMINAL APPROACH  1989   POLYPECTOMY  03/14/2018   Procedure: POLYPECTOMY;  Surgeon: Charlott Rakes, MD;  Location: [facility];  Service: Endoscopy;;   TOTAL KNEE REVISION  06/21/2012   Procedure: TOTAL KNEE REVISION;  Surgeon: Nadara Mustard, MD;  Location: [facility];  Service: Orthopedics;  Laterality: Right;  Revision Right Total Knee Arthroplasty   Patient Active Problem List   Diagnosis Date Noted   Bipolar 1 disorder  Degeneration of lumbar intervertebral disc 08/07/2020   Essential (primary) hypertension 05/07/2020   Other chronic pain 02/14/2020   Elevated blood-pressure reading, without diagnosis of hypertension 02/13/2020   Hyperlipidemia 09/17/2019   COPD mixed type 04/17/2019   Tobacco user 04/17/2019   Nocturnal hypoxemia 12/25/2018   Muscle weakness 12/05/2018   Change in bowel habits 03/14/2018   Chest pain 01/26/2016   Hallucination, drug-induced 03/12/2015   Hypothyroidism 03/12/2015   Chronic low back pain 01/29/2015   Lumbar spondylosis 01/29/2015   Lap gastric bypass June 2016 11/25/2014   Diabetes mellitus with neuropathy 06/20/2013   Chronic pain of right knee 06/20/2013   Morbid obesity 07/05/2012    REFERRING DIAG: M48.062 (ICD-10-CM) - Spinal stenosis of lumbar region with neurogenic claudication   THERAPY DIAG:  Other low back pain  Abnormal posture  Radiculopathy of lumbar region  ONSET DATE: chronic    SUBJECTIVE:                                                                                                                                                                                             SUBJECTIVE STATEMENT: I could not walk.  I cannot move quick enough to get to the bathroom. She often cannot control her bladder.  Bowel control is OK.  Transportation did not arrive to help her get to Dr. Lovell Sheehan appt.  She sees him 10/29/22.  She has fallen x 2 while walking in the house.  Legs gave out. Sees primary today.  TENS helped for about 2 hours.     PERTINENT HISTORY:  See above  diabetic  gastric bypass , chronic back pain for multiple injections   M48.062 (ICD-10-CM) - Spinal stenosis of lumbar region with neurogenic claudication   Tressie Stalker, MD   PAIN:  Are you having pain? Yes: NPRS scale: 6/10 Pain location: bilateral low back to bilateral LEs  Pain description: radiating, sore, throbbing   Aggravating factors: standing, walk Relieving factors: sitting , meds    PRECAUTIONS: Fall   WEIGHT BEARING RESTRICTIONS: No   FALLS:  Has patient fallen in last 6 months? Yes. Number of falls 5   LIVING ENVIRONMENT: Lives with: lives with their partner Lives in: House/apartment Stairs: No has stairs to enter 4 steps  Has following equipment at home: Single point cane, walker    OCCUPATION: disabled, worked with the homeless case mgr.    PLOF: Independent with basic ADLs, Needs assistance with homemaking, and Leisure: limited due to pain, would love to be able to play with grands    PATIENT GOALS: Pain relief   NEXT MD VISIT: 09/13/22 Dr.  Newlin    OBJECTIVE: (objective measures completed at initial evaluation unless otherwise dated)   DIAGNOSTIC FINDINGS:  Stenosis at L4-L5   PATIENT SURVEYS:  FOTO emailed    SCREENING FOR RED FLAGS: Bowel or bladder incontinence: Yes: not sure  Spinal tumors: No Cauda equina syndrome: No Compression fracture: No Abdominal aneurysm: No    COGNITION: Overall cognitive status: WNL                              SENSATION: WFL   MUSCLE LENGTH: Hamstrings: WFL  Thomas test: NT, appears tight    POSTURE: flexed trunk  and genu varus L knee   PALPATION: Does not tolerate    LUMBAR ROM:    AROM eval  Flexion WFL   Extension 75%  Right lateral flexion    Left lateral flexion    Right rotation 50% pain   Left rotation 50% pain    (Blank rows = not tested)   LOWER EXTREMITY ROM:      Active  Right eval Left eval  Hip flexion      Hip extension      Hip abduction      Hip adduction      Hip internal rotation WNL WNL  Hip external rotation WNL WNL   Knee flexion      Knee extension      Ankle dorsiflexion      Ankle plantarflexion      Ankle inversion      Ankle eversion       (Blank rows = not tested)   LOWER EXTREMITY MMT:     MMT Right eval Left eval  Hip flexion 3+ 4  Hip extension      Hip abduction      Hip adduction      Hip internal rotation      Hip external rotation      Knee flexion 4 5  Knee extension 4 5  Ankle dorsiflexion 4 5  Ankle plantarflexion      Ankle inversion      Ankle eversion       (Blank rows = not tested)   LUMBAR SPECIAL TESTS:  Straight leg raise test: Negative   FUNCTIONAL TESTS:  NT   GAIT: Distance walked: 75 feet Assistive device utilized: Single point cane and None Level of assistance: Modified independence Comments: NT    TODAY'S TREATMENT:    [facility] Adult PT Treatment:                                                DATE: 10/07/22 Therapeutic Exercise: Supine A/P tilt legs supported on bolster Core bracing , breathing  Partial brdige too painful Knee to chest gentle  Lower trunk rotation x 10  Figure 4 press on knee gentle in supine  S/L clam x 15  Manual Therapy: PROM hamstring stretch, hip ER and IR   Modalities: IFC for 15 min supine with cold pack  40 Mv per tolerance lumbar central spine   Self Care: Importance of gentle mobility  and light movement to improve pain Avoid bedrest Ask MD about Rx for Home TENS unit     [facility] Adult PT Treatment:  DATE: 09/13/22 Therapeutic Exercise: NuStep L6 UE and LE for 6 min  Supine PPT x 10 with core focus Oblique isometric ball press in supine/hooklying  Knee to chest x 2  Mini bridge, cues needed  Used dynadisc under pelvis for march and clam   Modalities: IFC for 12 min to see if Home TENS unit is an option for her  28 Mv per tolerance lumbar central spine  Self Care: TENS use and contraindicatio n/indications        [facility] Adult PT Treatment:                                                DATE: 09/09/22 Therapeutic Exercise: Nustep 8 min L4 UE/LE SKTC x3 20 sec LTR in comfortable range x10 10" PPT 2x10 3" Bridge small range x15 3" HL clam x15 3" HL marching x15 3" Updated HEP                                                                                                                              DATE: 08/17/22      PATIENT EDUCATION:  Education details: POC, HEP, surgery/recovery, core and breathing  Person educated: Patient Education method: Explanation, Demonstration, and Handouts Education comprehension: verbalized understanding, verbal cues required, and needs further education   HOME EXERCISE PROGRAM: Access Code: [email or web address] URL: [email or web address] Date: 09/09/2022 Prepared by: Joellyn Rued  Exercises - Supine Lower Trunk Rotation  - 1 x daily - 7 x weekly - 2 sets - 10 reps - 10 hold - Hooklying Single Knee to Chest Stretch  - 1 x daily - 7 x weekly - 1 sets - 5 reps - 30 hold - Supine Posterior Pelvic Tilt  - 1 x daily - 7 x weekly - 2 sets - 10 reps - 10 hold - Supine Bridge  - 1 x daily - 7 x weekly - 2 sets - 10 reps - 3 hold - Hooklying Clamshell with Resistance  - 1 x daily - 7 x weekly - 2 sets - 10 reps - 3 hold - Supine March  - 1 x daily - 7 x weekly - 2 sets - 10 reps - 3 hold    ASSESSMENT:   CLINICAL IMPRESSION: Her third treatment session today.  She has not been here in about 3 weeks barriers to therapy include transportation and severe pain in her back, inability to walk.  She has had to use her walker in her home.  She has had 2 falls and is having difficulty with bladder control.  She does not see Dr. Lovell Sheehan until next month but does see her primary doctor today.  She would like to try to get a home TENS unit and I asked if the doctor would write her a referral for this today.  Will try  to do what we can here in therapy until she sees the doctor to try to reduce her symptoms.  OBJECTIVE IMPAIRMENTS: Abnormal gait, decreased activity tolerance, decreased balance, decreased endurance, decreased mobility, difficulty walking, decreased ROM, decreased strength, increased fascial restrictions, impaired flexibility, improper body mechanics, postural dysfunction, obesity, and pain.    ACTIVITY LIMITATIONS: carrying, lifting, bending, sitting, standing, squatting, sleeping, stairs, transfers, bed mobility, continence, and locomotion level   PARTICIPATION LIMITATIONS: meal prep, cleaning, laundry, interpersonal relationship, shopping, and community activity   PERSONAL FACTORS: Past/current experiences, Time since onset of injury/illness/exacerbation, and 3+ comorbidities: obesity, diabetes, smoker  are also affecting patient's functional outcome.    REHAB POTENTIAL: Fair Pt likely having surgery   CLINICAL DECISION MAKING: Stable/uncomplicated   EVALUATION COMPLEXITY: Low     GOALS:   LONG TERM GOALS: Target date: 08/16/22   Pt will be I with HEP for positioning and core, breathing Baseline: given on eval  Goal status: Ongoing   2.  Pt will be able to get up and down from the mat without increasing back pain  Baseline: needs cues, pain  Goal status: INITIAL   3.  Pt will be able to stand for ADLs 10 minutes min increase in back and leg pain Baseline: varies,  pain increases standing 5 min  Goal status: INITIAL   4.  Patient will be able to walk in her home without increased pain for up to 10 minutes Baseline: varies, pain increase with walking 5 min  Goal status: INITIAL   5.  Patient will be screened for balance and goal set Baseline: unable on eval  Goal status: INITIAL   6.  Foto score TBA and goals set when able Baseline: emailed  Goal status: INITIAL   PLAN:   PT FREQUENCY: 1-2x/week   PT DURATION: 4 weeks-6 weeks    PLANNED INTERVENTIONS: Therapeutic exercises, Therapeutic activity, Neuromuscular re-education, Balance training, Gait training, Patient/Family education, Self Care, Joint mobilization, DME instructions, Electrical stimulation, Spinal mobilization, Cryotherapy, Moist heat, Manual therapy, and Re-evaluation.   PLAN FOR NEXT SESSION: Check home exercise program continue core stability ,breathing, conditioning and body mechanics, NuStep modalities for pain Karie Mainland, PT 10/07/22 10:15 AM Phone: [phone] Fax: [phone]

## 2022-10-07 ENCOUNTER — Ambulatory Visit: Payer: Medicare HMO | Attending: Family Medicine | Admitting: Family Medicine

## 2022-10-07 ENCOUNTER — Ambulatory Visit: Payer: Medicare HMO | Admitting: Physical Therapy

## 2022-10-07 ENCOUNTER — Encounter: Payer: Self-pay | Admitting: Family Medicine

## 2022-10-07 VITALS — BP 151/89 | HR 73 | Ht 65.0 in | Wt 214.8 lb

## 2022-10-07 DIAGNOSIS — M5416 Radiculopathy, lumbar region: Secondary | ICD-10-CM

## 2022-10-07 DIAGNOSIS — E1159 Type 2 diabetes mellitus with other circulatory complications: Secondary | ICD-10-CM | POA: Diagnosis not present

## 2022-10-07 DIAGNOSIS — E039 Hypothyroidism, unspecified: Secondary | ICD-10-CM | POA: Diagnosis not present

## 2022-10-07 DIAGNOSIS — R293 Abnormal posture: Secondary | ICD-10-CM

## 2022-10-07 DIAGNOSIS — M5136 Other intervertebral disc degeneration, lumbar region: Secondary | ICD-10-CM | POA: Diagnosis not present

## 2022-10-07 DIAGNOSIS — F250 Schizoaffective disorder, bipolar type: Secondary | ICD-10-CM | POA: Diagnosis not present

## 2022-10-07 DIAGNOSIS — R9439 Abnormal result of other cardiovascular function study: Secondary | ICD-10-CM

## 2022-10-07 DIAGNOSIS — J3089 Other allergic rhinitis: Secondary | ICD-10-CM

## 2022-10-07 DIAGNOSIS — E1140 Type 2 diabetes mellitus with diabetic neuropathy, unspecified: Secondary | ICD-10-CM | POA: Diagnosis not present

## 2022-10-07 DIAGNOSIS — Z6835 Body mass index (BMI) 35.0-35.9, adult: Secondary | ICD-10-CM

## 2022-10-07 DIAGNOSIS — I152 Hypertension secondary to endocrine disorders: Secondary | ICD-10-CM

## 2022-10-07 DIAGNOSIS — M5459 Other low back pain: Secondary | ICD-10-CM

## 2022-10-07 DIAGNOSIS — N3281 Overactive bladder: Secondary | ICD-10-CM

## 2022-10-07 DIAGNOSIS — R6889 Other general symptoms and signs: Secondary | ICD-10-CM | POA: Diagnosis not present

## 2022-10-07 LAB — POCT GLYCOSYLATED HEMOGLOBIN (HGB A1C): HbA1c, POC (controlled diabetic range): 6.5 % (ref 0.0–7.0)

## 2022-10-07 LAB — GLUCOSE, POCT (MANUAL RESULT ENTRY): POC Glucose: 108 mg/dl — AB (ref 70–99)

## 2022-10-07 MED ORDER — FLUTICASONE PROPIONATE 50 MCG/ACT NA SUSP: NASAL | 2 refills | Status: DC

## 2022-10-07 MED ORDER — DAPAGLIFLOZIN PROPANEDIOL 5 MG PO TABS: 5.0000 mg | ORAL_TABLET | Freq: Every day | ORAL | 1 refills | Status: DC

## 2022-10-07 MED ORDER — SOLIFENACIN SUCCINATE 5 MG PO TABS: 5.0000 mg | ORAL_TABLET | Freq: Every day | ORAL | 1 refills | Status: DC

## 2022-10-07 MED ORDER — SPIRONOLACTONE 25 MG PO TABS: 12.5000 mg | ORAL_TABLET | Freq: Every day | ORAL | 1 refills | Status: DC

## 2022-10-07 NOTE — Progress Notes (Signed)
Over active bladder-medication not working

## 2022-10-07 NOTE — Patient Instructions (Signed)

## 2022-10-07 NOTE — Progress Notes (Signed)
Subjective:  Patient ID: Caroline Williams, female    DOB: [birth year]  Age: 62 y.o. MRN: [id number]  CC: Diabetes   HPI Caroline Williams is a 62 y.o. year old female with a history of type 2 diabetes mellitus (diet-controlled with A1c of 6.5), hypothyroidism, Schizoaffective disorder (managed by [facility]), hypertension.  Last office visit was in 06/2021  Interval History:  She is being worked up for spine surgery due to Spinal stenosis. She sees Pain management with [facility].  She ran out of most of her medications including her Levothyroxine. She has also been out of [place]. Her blood sugars have been low to about 60, 70 around 5am and she has had shaking and sweating along with a headache. A1c is 6.5 down from 6.8.  Blood pressure is elevated and she has been without her antihypertensives.  She has also not been to see cardiology in a while.  It appears she was previously followed by Dr. Katrinka Blazing and she did have an abnormal stress test in 2022 which revealed an EF of 45 to 50%, generalized decreased wall motion but no ST segment deviation, no perfusion defect suggestive of ischemia.  Denies presence of chest pains, dyspnea or palpitations. Continues to follow-up with behavioral health for management of her schizoaffective disorder. She complains her oxybutynin is not helping with her bladder symptoms as she continues to have urgency. Past Medical History:  Diagnosis Date   Anxiety    Arthritis    Asthma    Bipolar 1 disorder (HCC)    Breast discharge    Breast lump    Breast pain    Chronic pain    on MS Contin and oxycodone   Depression    Diabetes mellitus    diet and exercise controlled   Escherichia coli (E. coli) infection    Fever    GERD (gastroesophageal reflux disease)    History of chest pain    History of kidney stones    History of knee replacement, total    Hypertension    Hypothyroidism    N&V (nausea and vomiting)     Peripheral neuropathy    Right foot no sensation   Pneumonia    2015,2014   Sciatica    Sleep apnea    does not use CPAP   Thyroid disease    Wears glasses     Past Surgical History:  Procedure Laterality Date   ABDOMINAL HYSTERECTOMY     partial   APPENDECTOMY     BIOPSY  03/14/2018   Procedure: BIOPSY;  Surgeon: Charlott Rakes, MD;  Location: [facility];  Service: Endoscopy;;   BREAST DUCTAL SYSTEM EXCISION Right 08/31/2017   Procedure: RIGHT BREAST CENTRAL DUCT EXCISION;  Surgeon: Harriette Bouillon, MD;  Location: [facility];  Service: General;  Laterality: Right;   BREAST EXCISIONAL BIOPSY Right    BREAST LUMPECTOMY     right   CARDIAC CATHETERIZATION     no significant CAD, nl LV function by 11/08/06 cath   CESAREAN SECTION     x4   COLONOSCOPY WITH PROPOFOL N/A 03/14/2018   Procedure: COLONOSCOPY WITH PROPOFOL;  Surgeon: Charlott Rakes, MD;  Location: [facility];  Service: Endoscopy;  Laterality: N/A;   GASTRIC ROUX-EN-Y N/A 11/25/2014   Procedure: LAPAROSCOPIC ROUX-EN-Y GASTRIC BYPASS WITH UPPER ENDOSCOPY;  Surgeon: Luretha Murphy, MD;  Location: [facility];  Service: General;  Laterality: N/A;   HERNIA REPAIR     JOINT REPLACEMENT  KNEE SURGERY     right   MOUTH SURGERY     MYOMECTOMY ABDOMINAL APPROACH  1989   POLYPECTOMY  03/14/2018   Procedure: POLYPECTOMY;  Surgeon: Charlott Rakes, MD;  Location: [facility];  Service: Endoscopy;;   TOTAL KNEE REVISION  06/21/2012   Procedure: TOTAL KNEE REVISION;  Surgeon: Nadara Mustard, MD;  Location: [facility];  Service: Orthopedics;  Laterality: Right;  Revision Right Total Knee Arthroplasty    Family History  Problem Relation Age of Onset   Cancer Father        colon   Stroke Father    Heart disease Father    Diabetes Father    Hypertension Father    Depression Sister    Hypertension Mother    Breast cancer Neg Hx     Social History   Socioeconomic History   Marital status: Single    Spouse  name: Not on file   Number of children: Not on file   Years of education: Not on file   Highest education level: Not on file  Occupational History   Not on file  Tobacco Use   Smoking status: Former    Packs/day: 0.25    Years: 43.00    Additional pack years: 0.00    Total pack years: 10.75    Types: Cigarettes    Quit date: 2020    Years since quitting: 4.3   Smokeless tobacco: Never  Vaping Use   Vaping Use: Never used  Substance and Sexual Activity   Alcohol use: Yes    Comment: social   Drug use: No   Sexual activity: Yes    Birth control/protection: Surgical  Other Topics Concern   Not on file  Social History Narrative   Not on file   Social Determinants of Health   Financial Resource Strain: Medium Risk (02/25/2021)   Overall Financial Resource Strain (CARDIA)    Difficulty of Paying Living Expenses: Somewhat hard  Food Insecurity: No Food Insecurity (02/25/2021)   Hunger Vital Sign    Worried About Running Out of Food in the Last Year: Never true    Ran Out of Food in the Last Year: Never true  Transportation Needs: No Transportation Needs (02/25/2021)   PRAPARE - Administrator, Civil Service (Medical): No    Lack of Transportation (Non-Medical): No  Physical Activity: Inactive (02/25/2021)   Exercise Vital Sign    Days of Exercise per Week: 0 days    Minutes of Exercise per Session: 0 min  Stress: No Stress Concern Present (02/25/2021)   [employer] of Occupational Health - Occupational Stress Questionnaire    Feeling of Stress : Only a little  Social Connections: Moderately Integrated (02/25/2021)   Social Connection and Isolation Panel [NHANES]    Frequency of Communication with Friends and Family: More than three times a week    Frequency of Social Gatherings with Friends and Family: Never    Attends Religious Services: 1 to 4 times per year    Active Member of [employer] or Organizations: No    Attends Engineer, structural: Never     Marital Status: Living with partner    Allergies  Allergen Reactions   Aspirin Shortness Of Breath and Other (See Comments)    Pt states she has had Toradol several times without any reactions   Bee Venom Anaphylaxis   Sulfa Antibiotics Anaphylaxis   Other Other (See Comments)    No blood products.  Patient did  Request that only albumin or albumin-containing products may be administered   Repatha [Evolocumab]     Outpatient Medications Prior to Visit  Medication Sig Dispense Refill   acetaminophen (TYLENOL) 500 MG tablet Take 1,000 mg by mouth every 6 (six) hours as needed for moderate pain or headache.     albuterol (VENTOLIN HFA) 108 (90 Base) MCG/ACT inhaler INHALE 2 PUFFS INTO THE LUNGS EVERY 6 HOURS AS NEEDED FOR WHEEZING OR SHORTNESS OF BREATH 20.1 g 1   ALPRAZolam (XANAX) 1 MG tablet Take 1 mg by mouth 3 (three) times daily as needed for anxiety.  0   amphetamine-dextroamphetamine (ADDERALL XR) 30 MG 24 hr capsule Take 30 mg by mouth daily.      amphetamine-dextroamphetamine (ADDERALL) 20 MG tablet Take 20 mg by mouth every evening.  0   Bempedoic Acid-Ezetimibe (NEXLIZET) 180-10 MG TABS Take 1 tablet by mouth daily. 90 tablet 3   Blood Glucose Monitoring Suppl (ACCU-CHEK GUIDE) w/Device KIT 1 kit by Does not apply route 3 (three) times daily. To check blood sugars 1 kit 0   cetirizine (ZYRTEC) 10 MG tablet Take 1 tablet (10 mg total) by mouth daily. (Patient taking differently: Take 10 mg by mouth at bedtime.) 90 tablet 1   Cyanocobalamin (VITAMIN B 12 PO) Take 1 tablet by mouth daily. Unsure of dose     diclofenac Sodium (VOLTAREN) 1 % GEL Apply 2-4 g topically 2 (two) times daily as needed (wrist/knee pain). 100 g 1   doxepin (SINEQUAN) 75 MG capsule Take 75 mg by mouth at bedtime.  0   glucose blood (ACCU-CHEK GUIDE) test strip Use as instructed 100 each 12   hydrocortisone-pramoxine (ANALPRAM HC) 2.5-1 % rectal cream Place 1 application rectally 3 (three) times daily.  (Patient taking differently: Place 1 application  rectally 2 (two) times daily as needed for hemorrhoids.) 30 g 2   isosorbide mononitrate (IMDUR) 30 MG 24 hr tablet Take 1 tablet (30 mg total) by mouth daily. 90 tablet 3   Lancets (ACCU-CHEK MULTICLIX) lancets Use as instructed 100 each 12   levothyroxine (SYNTHROID) 150 MCG tablet TAKE 1 TABLET(150 MCG) BY MOUTH DAILY BEFORE BREAKFAST 30 tablet 0   linaclotide (LINZESS) 290 MCG CAPS capsule Take 290 mcg by mouth See admin instructions. Every other night     methocarbamol (ROBAXIN) 500 MG tablet Take 1 tablet (500 mg total) by mouth every 8 (eight) hours as needed for muscle spasms. 15 tablet 0   morphine (MS CONTIN) 15 MG 12 hr tablet Take 1 tablet (15 mg total) by mouth every 12 (twelve) hours. 60 tablet 0   nabumetone (RELAFEN) 750 MG tablet Take 750 mg by mouth 2 (two) times daily.     naloxone (NARCAN) nasal spray 4 mg/0.1 mL Place 1 spray into the nose as needed (overdose).     nitroGLYCERIN (NITROSTAT) 0.4 MG SL tablet DISSOLVE 1 TABLET UNDER THE TONGUE EVERY 5 MINUTES AS NEEDED (Patient taking differently: Place 0.4 mg under the tongue every 5 (five) minutes as needed for chest pain.) 25 tablet 3   Olmesartan-amLODIPine-HCTZ 40-5-25 MG TABS Take 1 tablet by mouth daily. 15 tablet 0   omeprazole (PRILOSEC) 40 MG capsule TAKE 1 CAPSULE(40 MG) BY MOUTH DAILY (Patient taking differently: Take 40 mg by mouth daily.) 90 capsule 1   Oxycodone HCl 10 MG TABS Take 1 tablet (10 mg total) by mouth every 6 (six) hours as needed (for pain). (Patient taking differently: Take 10 mg by mouth 3 (three)  times daily.) 120 tablet 0   PRALUENT 150 MG/ML SOAJ INJECT 1 PEN INTO SKIN EVERY 14 DAYS. (Patient taking differently: Inject 150 mg into the skin every 14 (fourteen) days.) 6 mL 3   promethazine (PHENERGAN) 25 MG tablet Take 1 tablet (25 mg total) by mouth every 8 (eight) hours as needed for nausea or vomiting. 20 tablet 0   umeclidinium-vilanterol (ANORO  ELLIPTA) 62.5-25 MCG/INH AEPB Inhale 1 puff into the lungs daily. 1 each 0   vortioxetine HBr (TRINTELLIX) 20 MG TABS tablet Take 20 mg by mouth daily.     dapagliflozin propanediol (FARXIGA) 5 MG TABS tablet Take 1 tablet (5 mg total) by mouth daily before breakfast. 30 tablet 3   estradiol (ESTRACE VAGINAL) 0.1 MG/GM vaginal cream 1 g per vagina daily 42.5 g 1   fluticasone (FLONASE) 50 MCG/ACT nasal spray SHAKE LIQUID AND USE 2 SPRAYS IN EACH NOSTRIL DAILY 16 g 2   oxybutynin (DITROPAN) 5 MG tablet TAKE 1 TABLET(5 MG) BY MOUTH TWICE DAILY 180 tablet 0   spironolactone (ALDACTONE) 25 MG tablet Take 0.5 tablets (12.5 mg total) by mouth daily. 45 tablet 3   ciclopirox (PENLAC) 8 % solution Apply 1 application  topically See admin instructions. Qd x 6 days, then remove with alcohol, then repeat 6.6 mL 1   predniSONE (DELTASONE) 10 MG tablet Take 3 tablets (30 mg total) by mouth daily with breakfast for 3 days, THEN 2 tablets (20 mg total) daily with breakfast for 3 days, then 1 tablet (10 mg total) daily with breakfast for 3 days. 18 tablet 0   VRAYLAR 6 MG CAPS Take 6 mg by mouth at bedtime.     No facility-administered medications prior to visit.     ROS Review of Systems  Constitutional:  Negative for activity change and appetite change.  HENT:  Negative for sinus pressure and sore throat.   Respiratory:  Negative for chest tightness, shortness of breath and wheezing.   Cardiovascular:  Negative for chest pain and palpitations.  Gastrointestinal:  Negative for abdominal distention, abdominal pain and constipation.  Genitourinary: Negative.   Musculoskeletal: Negative.   Psychiatric/Behavioral:  Negative for behavioral problems and dysphoric mood.     Objective:  BP (!) 151/89   Pulse 73   Ht 5\' 5"  (1.651 m)   Wt 214 lb 12.8 oz (97.4 kg)   SpO2 96%   BMI 35.74 kg/m      10/07/2022    2:53 PM 10/07/2022    2:00 PM 04/16/2022    7:14 PM  BP/Weight  Systolic BP 151 150 181   Diastolic BP 89 94 103  Wt. (Lbs)  214.8   BMI  35.74 kg/m2       Physical Exam Constitutional:      Appearance: She is well-developed. She is obese.  Cardiovascular:     Rate and Rhythm: Normal rate.     Heart sounds: Normal heart sounds. No murmur heard. Pulmonary:     Effort: Pulmonary effort is normal.     Breath sounds: Normal breath sounds. No wheezing or rales.  Chest:     Chest wall: No tenderness.  Abdominal:     General: Bowel sounds are normal. There is no distension.     Palpations: Abdomen is soft. There is no mass.     Tenderness: There is no abdominal tenderness.  Musculoskeletal:     Right lower leg: No edema.     Left lower leg: No edema.  Comments: Slight lumbar spine tenderness  Neurological:     Mental Status: She is alert and oriented to person, place, and time.  Psychiatric:        Mood and Affect: Mood normal.        Latest Ref Rng & Units 09/09/2021   10:50 PM 06/11/2021    5:32 PM 04/07/2021   12:02 PM  CMP  Glucose 70 - 99 mg/dL 161   76   BUN 8 - 23 mg/dL 31   8   Creatinine 0.96 - 1.00 mg/dL 0.45  4.09  8.11   Sodium 135 - 145 mmol/L 136   141   Potassium 3.5 - 5.1 mmol/L 3.7   4.8   Chloride 98 - 111 mmol/L 104   99   CO2 20 - 29 mmol/L   20   Calcium 8.7 - 10.3 mg/dL   9.8   Total Protein 6.0 - 8.5 g/dL   8.3   Total Bilirubin 0.0 - 1.2 mg/dL   0.3   Alkaline Phos 44 - 121 IU/L   157   AST 0 - 40 IU/L   28   ALT 0 - 32 IU/L   14     Lipid Panel     Component Value Date/Time   CHOL 280 (H) 04/07/2021 1202   TRIG 61 04/07/2021 1202   HDL 87 04/07/2021 1202   CHOLHDL 1.6 07/14/2020 0754   LDLCALC 184 (H) 04/07/2021 1202   LDLDIRECT 84 04/10/2020 0759    CBC    Component Value Date/Time   WBC 5.6 05/21/2021 0846   WBC 5.0 06/29/2018 1835   RBC 4.93 05/21/2021 0846   RBC 5.68 (H) 06/29/2018 1835   HGB 13.3 09/09/2021 2250   HGB 13.1 05/21/2021 0846   HCT 39.0 09/09/2021 2250   HCT 40.9 05/21/2021 0846   PLT 503  (H) 05/21/2021 0846   MCV 83 05/21/2021 0846   MCH 26.6 05/21/2021 0846   MCH 26.8 06/29/2018 1835   MCHC 32.0 05/21/2021 0846   MCHC 31.6 06/29/2018 1835   RDW 13.3 05/21/2021 0846   LYMPHSABS 1.2 05/21/2021 0846   MONOABS 0.5 03/11/2015 2127   EOSABS 0.1 05/21/2021 0846   BASOSABS 0.0 05/21/2021 0846    Lab Results  Component Value Date   HGBA1C 6.5 10/07/2022    Lab Results  Component Value Date   TSH 81.100 (H) 07/13/2021    Assessment & Plan:  1. Type 2 diabetes mellitus with chronic painful diabetic neuropathy Controlled with A1c of 6.5 She has experienced hypoglycemic symptoms even though she has not been on Farxiga Advised to eat a late night snack to prevent dipping of blood sugars Counseled on Diabetic diet, my plate method, 914 minutes of moderate intensity exercise/week Blood sugar logs with fasting goals of 80-120 mg/dl, random of less than 782 and in the event of sugars less than 60 mg/dl or greater than 956 mg/dl encouraged to notify the clinic. Advised on the need for annual eye exams, annual foot exams, Pneumonia vaccine. - POCT glucose (manual entry) - POCT glycosylated hemoglobin (Hb A1C) - CMP14+EGFR - Microalbumin/Creatinine Ratio, Urine - dapagliflozin propanediol (FARXIGA) 5 MG TABS tablet; Take 1 tablet (5 mg total) by mouth daily before breakfast.  Dispense: 90 tablet; Refill: 1  2. Non-seasonal allergic rhinitis due to other allergic trigger - fluticasone (FLONASE) 50 MCG/ACT nasal spray; SHAKE LIQUID AND USE 2 SPRAYS IN EACH NOSTRIL DAILY  Dispense: 16 g; Refill: 2  3. Hypothyroidism, unspecified  type Uncontrolled as she has been without levothyroxine I will make no regimen changes today.  Thyroid function test is abnormal - T4, free - TSH - T3  4. Morbid obesity (HCC) Counseled on lifestyle modification, caloric restriction Consider GLP-1 medication at next visit  5. Schizoaffective disorder, bipolar type (HCC) Stable Followed by  psychiatry  6. Overactive bladder Uncontrolled Discontinue oxybutynin Initiate Vesicare - solifenacin (VESICARE) 5 MG tablet; Take 1 tablet (5 mg total) by mouth daily.  Dispense: 90 tablet; Refill: 1  7. Degeneration of lumbar intervertebral disc With associated spinal stenosis Currently under the care of pain management Being worked up for elective surgery  8. Abnormal stress test Stress test from 2022 revealed EF of 45%, generalized decreased wall motion She will need further workup including a repeat echo and possible cardiac cath - Ambulatory referral to Cardiology  9. Hypertension associated with diabetes (HCC) Uncontrolled due to running out of her medications which I have refilled. Counseled on blood pressure goal of less than 130/80, low-sodium, DASH diet, medication compliance, 150 minutes of moderate intensity exercise per week. Discussed medication compliance, adverse effects. - spironolactone (ALDACTONE) 25 MG tablet; Take 0.5 tablets (12.5 mg total) by mouth daily.  Dispense: 45 tablet; Refill: 1    Meds ordered this encounter  Medications   solifenacin (VESICARE) 5 MG tablet    Sig: Take 1 tablet (5 mg total) by mouth daily.    Dispense:  90 tablet    Refill:  1    Discontinue Oxybutynin   fluticasone (FLONASE) 50 MCG/ACT nasal spray    Sig: SHAKE LIQUID AND USE 2 SPRAYS IN EACH NOSTRIL DAILY    Dispense:  16 g    Refill:  2   spironolactone (ALDACTONE) 25 MG tablet    Sig: Take 0.5 tablets (12.5 mg total) by mouth daily.    Dispense:  45 tablet    Refill:  1   dapagliflozin propanediol (FARXIGA) 5 MG TABS tablet    Sig: Take 1 tablet (5 mg total) by mouth daily before breakfast.    Dispense:  90 tablet    Refill:  1    Follow-up: Return in about 3 months (around 01/06/2023) for Chronic medical conditions.       Hoy Register, MD, FAAFP. [facility] [place], [place] [phone]   10/07/2022, 6:17 PM

## 2022-10-08 LAB — CMP14+EGFR
ALT: 26 IU/L (ref 0–32)
AST: 34 IU/L (ref 0–40)
Albumin/Globulin Ratio: 1.6 (ref 1.2–2.2)
Albumin: 4.5 g/dL (ref 3.9–4.9)
Alkaline Phosphatase: 138 IU/L — ABNORMAL HIGH (ref 44–121)
BUN/Creatinine Ratio: 14 (ref 12–28)
BUN: 19 mg/dL (ref 8–27)
Bilirubin Total: 0.2 mg/dL (ref 0.0–1.2)
CO2: 25 mmol/L (ref 20–29)
Calcium: 9.6 mg/dL (ref 8.7–10.3)
Chloride: 96 mmol/L (ref 96–106)
Creatinine, Ser: 1.38 mg/dL — ABNORMAL HIGH (ref 0.57–1.00)
Globulin, Total: 2.9 g/dL (ref 1.5–4.5)
Glucose: 107 mg/dL — ABNORMAL HIGH (ref 70–99)
Potassium: 3.7 mmol/L (ref 3.5–5.2)
Sodium: 138 mmol/L (ref 134–144)
Total Protein: 7.4 g/dL (ref 6.0–8.5)
eGFR: 43 mL/min/{1.73_m2} — ABNORMAL LOW (ref >59)

## 2022-10-08 LAB — T3: T3, Total: <20 ng/dL — ABNORMAL LOW (ref 71–180)

## 2022-10-08 LAB — TSH: TSH: 91.9 u[IU]/mL — ABNORMAL HIGH (ref 0.450–4.500)

## 2022-10-08 LAB — T4, FREE: Free T4: 0.16 ng/dL — ABNORMAL LOW (ref 0.82–1.77)

## 2022-10-12 ENCOUNTER — Telehealth: Payer: Self-pay

## 2022-10-12 ENCOUNTER — Ambulatory Visit: Payer: Medicare HMO

## 2022-10-12 NOTE — Telephone Encounter (Signed)
Oke with pt re: no show visit for today. Pt reports she over slept. Pt was reminded of her next appt and advised re: the attendance policy.

## 2022-10-13 NOTE — Telephone Encounter (Signed)
error 

## 2022-10-14 ENCOUNTER — Ambulatory Visit: Payer: Medicare HMO | Attending: Physical Medicine and Rehabilitation

## 2022-10-14 DIAGNOSIS — M5459 Other low back pain: Secondary | ICD-10-CM | POA: Diagnosis not present

## 2022-10-14 DIAGNOSIS — M5416 Radiculopathy, lumbar region: Secondary | ICD-10-CM | POA: Diagnosis not present

## 2022-10-14 DIAGNOSIS — R293 Abnormal posture: Secondary | ICD-10-CM

## 2022-10-14 NOTE — Therapy (Signed)
OUTPATIENT PHYSICAL THERAPY TREATMENT NOTE   Patient Name: Caroline Williams MRN: [id number] DOB:02-27-1961, 62 y.o., female Today's Date: 10/14/2022  PCP: Hoy Register MD  REFERRING PROVIDER: Dr. Tressie Stalker   END OF SESSION:   PT End of Session - 10/14/22 1302     Visit Number 5    Number of Visits 12    Date for PT Re-Evaluation 11/04/22    Authorization Type [employer] MCR , MCD    Authorization - Visit Number 4    Authorization - Number of Visits 12    Progress Note Due on Visit 10    PT Start Time 0941    PT Stop Time 1021    PT Time Calculation (min) 40 min    Activity Tolerance Patient limited by pain    Behavior During Therapy WFL for tasks assessed/performed                Past Medical History:  Diagnosis Date   Anxiety    Arthritis    Asthma    Bipolar 1 disorder (HCC)    Breast discharge    Breast lump    Breast pain    Chronic pain    on MS Contin and oxycodone   Depression    Diabetes mellitus    diet and exercise controlled   Escherichia coli (E. coli) infection    Fever    GERD (gastroesophageal reflux disease)    History of chest pain    History of kidney stones    History of knee replacement, total    Hypertension    Hypothyroidism    N&V (nausea and vomiting)    Peripheral neuropathy    Right foot no sensation   Pneumonia    2015,2014   Sciatica    Sleep apnea    does not use CPAP   Thyroid disease    Wears glasses    Past Surgical History:  Procedure Laterality Date   ABDOMINAL HYSTERECTOMY     partial   APPENDECTOMY     BIOPSY  03/14/2018   Procedure: BIOPSY;  Surgeon: Charlott Rakes, MD;  Location: [facility];  Service: Endoscopy;;   BREAST DUCTAL SYSTEM EXCISION Right 08/31/2017   Procedure: RIGHT BREAST CENTRAL DUCT EXCISION;  Surgeon: Harriette Bouillon, MD;  Location: [facility];  Service: General;  Laterality: Right;   BREAST EXCISIONAL BIOPSY Right    BREAST LUMPECTOMY     right   CARDIAC  CATHETERIZATION     no significant CAD, nl LV function by 11/08/06 cath   CESAREAN SECTION     x4   COLONOSCOPY WITH PROPOFOL N/A 03/14/2018   Procedure: COLONOSCOPY WITH PROPOFOL;  Surgeon: Charlott Rakes, MD;  Location: [facility];  Service: Endoscopy;  Laterality: N/A;   GASTRIC ROUX-EN-Y N/A 11/25/2014   Procedure: LAPAROSCOPIC ROUX-EN-Y GASTRIC BYPASS WITH UPPER ENDOSCOPY;  Surgeon: Luretha Murphy, MD;  Location: [facility];  Service: General;  Laterality: N/A;   HERNIA REPAIR     JOINT REPLACEMENT     KNEE SURGERY     right   MOUTH SURGERY     MYOMECTOMY ABDOMINAL APPROACH  1989   POLYPECTOMY  03/14/2018   Procedure: POLYPECTOMY;  Surgeon: Charlott Rakes, MD;  Location: [facility];  Service: Endoscopy;;   TOTAL KNEE REVISION  06/21/2012   Procedure: TOTAL KNEE REVISION;  Surgeon: Nadara Mustard, MD;  Location: [facility];  Service: Orthopedics;  Laterality: Right;  Revision Right Total Knee Arthroplasty   Patient Active Problem  List   Diagnosis Date Noted   Bipolar 1 disorder (HCC)    Degeneration of lumbar intervertebral disc 08/07/2020   Essential (primary) hypertension 05/07/2020   Other chronic pain 02/14/2020   Elevated blood-pressure reading, without diagnosis of hypertension 02/13/2020   Hyperlipidemia 09/17/2019   COPD mixed type (HCC) 04/17/2019   Tobacco user 04/17/2019   Nocturnal hypoxemia 12/25/2018   Muscle weakness 12/05/2018   Change in bowel habits 03/14/2018   Chest pain 01/26/2016   Hallucination, drug-induced (HCC) 03/12/2015   Hypothyroidism 03/12/2015   Chronic low back pain 01/29/2015   Lumbar spondylosis 01/29/2015   Lap gastric bypass June 2016 11/25/2014   Diabetes mellitus with neuropathy (HCC) 06/20/2013   Chronic pain of right knee 06/20/2013   Morbid obesity (HCC) 07/05/2012    REFERRING DIAG: Z61.096 (ICD-10-CM) - Spinal stenosis of lumbar region with neurogenic claudication   THERAPY DIAG:  Other low back pain  Abnormal  posture  Radiculopathy of lumbar region  ONSET DATE: chronic    SUBJECTIVE:                                                                                                                                                                                            SUBJECTIVE STATEMENT: Pt walked an extended distance yesterday and she is in more pain today. The e-stim helps to lower her low back apin for 1-2 hours.  PERTINENT HISTORY:  See above  diabetic  gastric bypass , chronic back pain for multiple injections   M48.062 (ICD-10-CM) - Spinal stenosis of lumbar region with neurogenic claudication   Tressie Stalker, MD   PAIN:  Are you having pain? Yes: NPRS scale: 8-9/10 Pain location: bilateral low back to bilateral LEs  Pain description: radiating, sore, throbbing   Aggravating factors: standing, walk Relieving factors: sitting , meds    PRECAUTIONS: Fall   WEIGHT BEARING RESTRICTIONS: No   FALLS:  Has patient fallen in last 6 months? Yes. Number of falls 5   LIVING ENVIRONMENT: Lives with: lives with their partner Lives in: House/apartment Stairs: No has stairs to enter 4 steps  Has following equipment at home: Single point cane, walker    OCCUPATION: disabled, worked with the homeless case mgr.    PLOF: Independent with basic ADLs, Needs assistance with homemaking, and Leisure: limited due to pain, would love to be able to play with grands    PATIENT GOALS: Pain relief   NEXT MD VISIT: 09/13/22 [name]    OBJECTIVE: (objective measures completed at initial evaluation unless otherwise dated)   DIAGNOSTIC FINDINGS:  Stenosis at L4-L5   PATIENT SURVEYS:  FOTO  emailed    SCREENING FOR RED FLAGS: Bowel or bladder incontinence: Yes: not sure  Spinal tumors: No Cauda equina syndrome: No Compression fracture: No Abdominal aneurysm: No   COGNITION: Overall cognitive status: WNL                              SENSATION: WFL   MUSCLE LENGTH: Hamstrings:  WFL  Thomas test: NT, appears tight    POSTURE: flexed trunk  and genu varus L knee   PALPATION: Does not tolerate    LUMBAR ROM:    AROM eval  Flexion WFL   Extension 75%  Right lateral flexion    Left lateral flexion    Right rotation 50% pain   Left rotation 50% pain    (Blank rows = not tested)   LOWER EXTREMITY ROM:      Active  Right eval Left eval  Hip flexion      Hip extension      Hip abduction      Hip adduction      Hip internal rotation WNL WNL  Hip external rotation WNL WNL   Knee flexion      Knee extension      Ankle dorsiflexion      Ankle plantarflexion      Ankle inversion      Ankle eversion       (Blank rows = not tested)   LOWER EXTREMITY MMT:     MMT Right eval Left eval  Hip flexion 3+ 4  Hip extension      Hip abduction      Hip adduction      Hip internal rotation      Hip external rotation      Knee flexion 4 5  Knee extension 4 5  Ankle dorsiflexion 4 5  Ankle plantarflexion      Ankle inversion      Ankle eversion       (Blank rows = not tested)   LUMBAR SPECIAL TESTS:  Straight leg raise test: Negative   FUNCTIONAL TESTS:  NT   GAIT: Distance walked: 75 feet Assistive device utilized: Single point cane and None Level of assistance: Modified independence Comments: NT    TODAY'S TREATMENT:  [facility] Adult PT Treatment:                                                DATE: 5/2//24 Therapeutic Exercise: NuStep L6 UE and LE for 6 min LAQ x10 3# SKTC x3 10" LTR x5 3" Supine Core bracing c ball press , breathing  Hamstring roll in/outs c swiss ball x15 Modalities: IFC to low back for 15 min in supine, intensity as tolerated  [facility] Adult PT Treatment:                                                DATE: 10/07/22 Therapeutic Exercise: Supine A/P tilt legs supported on bolster Core bracing , breathing  Partial brdige too painful Knee to chest gentle  Lower trunk rotation x 10  Figure 4 press on knee gentle in supine   S/L clam x 15  Manual Therapy: PROM hamstring stretch,  hip ER and IR   Modalities: IFC for 15 min supine with cold pack  40 Mv per tolerance lumbar central spine   Self Care: Importance of gentle mobility and light movement to improve pain Avoid bedrest Ask MD about Rx for Home TENS unit     [facility] Adult PT Treatment:                                                DATE: 09/13/22 Therapeutic Exercise: NuStep L6 UE and LE for 6 min  Supine PPT x 10 with core focus Oblique isometric ball press in supine/hooklying  Knee to chest x 2  Mini bridge, cues needed  Used dynadisc under pelvis for march and clam   Modalities: IFC for 12 min to see if Home TENS unit is an option for her  28 Mv per tolerance lumbar central spine  Self Care: TENS use and contraindicatio n/indications    [facility] Adult PT Treatment:                                                DATE: 09/09/22 Therapeutic Exercise: Nustep 8 min L4 UE/LE SKTC x3 20 sec LTR in comfortable range x10 10" PPT 2x10 3" Bridge small range x15 3" HL clam x15 3" HL marching x15 3" Updated HEP                                                                                                                              DATE: 08/17/22      PATIENT EDUCATION:  Education details: POC, HEP, surgery/recovery, core and breathing  Person educated: Patient Education method: Explanation, Demonstration, and Handouts Education comprehension: verbalized understanding, verbal cues required, and needs further education   HOME EXERCISE PROGRAM: Access Code: [email or web address] URL: [email or web address] Date: 09/09/2022 Prepared by: Joellyn Rued  Exercises - Supine Lower Trunk Rotation  - 1 x daily - 7 x weekly - 2 sets - 10 reps - 10 hold - Hooklying Single Knee to Chest Stretch  - 1 x daily - 7 x weekly - 1 sets - 5 reps - 30 hold - Supine Posterior Pelvic Tilt  - 1 x daily - 7 x weekly - 2 sets - 10 reps - 10 hold - Supine Bridge  - 1 x daily  - 7 x weekly - 2 sets - 10 reps - 3 hold - Hooklying Clamshell with Resistance  - 1 x daily - 7 x weekly - 2 sets - 10 reps - 3 hold - Supine March  - 1 x daily - 7 x weekly - 2 sets - 10 reps - 3 hold   ASSESSMENT:   CLINICAL IMPRESSION: Pt  reports she is waiting to receive the script from her MD to obtain a TENS unit. Pt presents with increased pain today following prolonged walking yesterday for approx 30 mins.. PT was completed for lumbopelvic flexibility and strengthening f/b IFC estim to the low back for 15 mins. Pt reports estim is helpful with decreasing her low back pain. Will continue PT to help with pain reduction and improved function.  OBJECTIVE IMPAIRMENTS: Abnormal gait, decreased activity tolerance, decreased balance, decreased endurance, decreased mobility, difficulty walking, decreased ROM, decreased strength, increased fascial restrictions, impaired flexibility, improper body mechanics, postural dysfunction, obesity, and pain.    ACTIVITY LIMITATIONS: carrying, lifting, bending, sitting, standing, squatting, sleeping, stairs, transfers, bed mobility, continence, and locomotion level   PARTICIPATION LIMITATIONS: meal prep, cleaning, laundry, interpersonal relationship, shopping, and community activity   PERSONAL FACTORS: Past/current experiences, Time since onset of injury/illness/exacerbation, and 3+ comorbidities: obesity, diabetes, smoker  are also affecting patient's functional outcome.    REHAB POTENTIAL: Fair Pt likely having surgery   CLINICAL DECISION MAKING: Stable/uncomplicated   EVALUATION COMPLEXITY: Low     GOALS:   LONG TERM GOALS: Target date: 11/04/22   Pt will be I with HEP for positioning and core, breathing Baseline: given on eval  Goal status: Ongoing   2.  Pt will be able to get up and down from the mat without increasing back pain  Baseline: needs cues, pain  Goal status: INITIAL   3.  Pt will be able to stand for ADLs 10 minutes min increase  in back and leg pain Baseline: varies, pain increases standing 5 min  Goal status: INITIAL   4.  Patient will be able to walk in her home without increased pain for up to 10 minutes Baseline: varies, pain increase with walking 5 min  Goal status: INITIAL   5.  Patient will be screened for balance and goal set Baseline: unable on eval  Goal status: INITIAL   6.  Foto score TBA and goals set when able Baseline: emailed  Goal status: INITIAL   PLAN:   PT FREQUENCY: 1-2x/week   PT DURATION: 4 weeks-6 weeks    PLANNED INTERVENTIONS: Therapeutic exercises, Therapeutic activity, Neuromuscular re-education, Balance training, Gait training, Patient/Family education, Self Care, Joint mobilization, DME instructions, Electrical stimulation, Spinal mobilization, Cryotherapy, Moist heat, Manual therapy, and Re-evaluation.   PLAN FOR NEXT SESSION: Check home exercise program continue core stability ,breathing, conditioning and body mechanics, NuStep modalities for pain  Syretta Kochel MS, PT 10/14/22 1:25 PM

## 2022-10-18 NOTE — Therapy (Unsigned)
OUTPATIENT PHYSICAL THERAPY TREATMENT NOTE DISCHARGE   Patient Name: Caroline Williams MRN: [id number] DOB:26-Feb-1961, 62 y.o., female Today's Date: 10/19/2022  PCP: Hoy Register MD  REFERRING PROVIDER: Dr. Tressie Stalker   END OF SESSION:   PT End of Session - 10/19/22 1004     Visit Number 6    Number of Visits 12    Date for PT Re-Evaluation 11/04/22    Authorization Type [employer] MCR , MCD    Authorization - Visit Number 5    Authorization - Number of Visits 12    Progress Note Due on Visit 10    PT Start Time 0950    PT Stop Time 1020    PT Time Calculation (min) 30 min    Activity Tolerance Patient limited by pain    Behavior During Therapy WFL for tasks assessed/performed                 Past Medical History:  Diagnosis Date   Anxiety    Arthritis    Asthma    Bipolar 1 disorder (HCC)    Breast discharge    Breast lump    Breast pain    Chronic pain    on MS Contin and oxycodone   Depression    Diabetes mellitus    diet and exercise controlled   Escherichia coli (E. coli) infection    Fever    GERD (gastroesophageal reflux disease)    History of chest pain    History of kidney stones    History of knee replacement, total    Hypertension    Hypothyroidism    N&V (nausea and vomiting)    Peripheral neuropathy    Right foot no sensation   Pneumonia    2015,2014   Sciatica    Sleep apnea    does not use CPAP   Thyroid disease    Wears glasses    Past Surgical History:  Procedure Laterality Date   ABDOMINAL HYSTERECTOMY     partial   APPENDECTOMY     BIOPSY  03/14/2018   Procedure: BIOPSY;  Surgeon: Charlott Rakes, MD;  Location: [facility];  Service: Endoscopy;;   BREAST DUCTAL SYSTEM EXCISION Right 08/31/2017   Procedure: RIGHT BREAST CENTRAL DUCT EXCISION;  Surgeon: Harriette Bouillon, MD;  Location: [facility];  Service: General;  Laterality: Right;   BREAST EXCISIONAL BIOPSY Right    BREAST LUMPECTOMY     right    CARDIAC CATHETERIZATION     no significant CAD, nl LV function by 11/08/06 cath   CESAREAN SECTION     x4   COLONOSCOPY WITH PROPOFOL N/A 03/14/2018   Procedure: COLONOSCOPY WITH PROPOFOL;  Surgeon: Charlott Rakes, MD;  Location: [facility];  Service: Endoscopy;  Laterality: N/A;   GASTRIC ROUX-EN-Y N/A 11/25/2014   Procedure: LAPAROSCOPIC ROUX-EN-Y GASTRIC BYPASS WITH UPPER ENDOSCOPY;  Surgeon: Luretha Murphy, MD;  Location: [facility];  Service: General;  Laterality: N/A;   HERNIA REPAIR     JOINT REPLACEMENT     KNEE SURGERY     right   MOUTH SURGERY     MYOMECTOMY ABDOMINAL APPROACH  1989   POLYPECTOMY  03/14/2018   Procedure: POLYPECTOMY;  Surgeon: Charlott Rakes, MD;  Location: [facility];  Service: Endoscopy;;   TOTAL KNEE REVISION  06/21/2012   Procedure: TOTAL KNEE REVISION;  Surgeon: Nadara Mustard, MD;  Location: [facility];  Service: Orthopedics;  Laterality: Right;  Revision Right Total Knee Arthroplasty   Patient  Active Problem List   Diagnosis Date Noted   Bipolar 1 disorder (HCC)    Degeneration of lumbar intervertebral disc 08/07/2020   Essential (primary) hypertension 05/07/2020   Other chronic pain 02/14/2020   Elevated blood-pressure reading, without diagnosis of hypertension 02/13/2020   Hyperlipidemia 09/17/2019   COPD mixed type (HCC) 04/17/2019   Tobacco user 04/17/2019   Nocturnal hypoxemia 12/25/2018   Muscle weakness 12/05/2018   Change in bowel habits 03/14/2018   Chest pain 01/26/2016   Hallucination, drug-induced (HCC) 03/12/2015   Hypothyroidism 03/12/2015   Chronic low back pain 01/29/2015   Lumbar spondylosis 01/29/2015   Lap gastric bypass June 2016 11/25/2014   Diabetes mellitus with neuropathy (HCC) 06/20/2013   Chronic pain of right knee 06/20/2013   Morbid obesity (HCC) 07/05/2012    REFERRING DIAG: Z61.096 (ICD-10-CM) - Spinal stenosis of lumbar region with neurogenic claudication   THERAPY DIAG:  Other low back pain  Abnormal  posture  Radiculopathy of lumbar region  ONSET DATE: chronic    SUBJECTIVE:                                                                                                                                                                                            SUBJECTIVE STATEMENT: This is my last visit. The pain is so bad.  I see Dr. Lovell Sheehan this month.   PERTINENT HISTORY:  See above  diabetic  gastric bypass , chronic back pain for multiple injections   M48.062 (ICD-10-CM) - Spinal stenosis of lumbar region with neurogenic claudication   Tressie Stalker, MD   PAIN:  Are you having pain? Yes: NPRS scale: 8-9/10 postmedicated Pain location: bilateral low back to bilateral LEs  Pain description: radiating, sore, throbbing   Aggravating factors: standing, walk Relieving factors: sitting , meds    PRECAUTIONS: Fall   WEIGHT BEARING RESTRICTIONS: No   FALLS:  Has patient fallen in last 6 months? Yes. Number of falls 5   LIVING ENVIRONMENT: Lives with: lives with their partner Lives in: House/apartment Stairs: No has stairs to enter 4 steps  Has following equipment at home: Single point cane, walker    OCCUPATION: disabled, worked with the homeless case mgr.    PLOF: Independent with basic ADLs, Needs assistance with homemaking, and Leisure: limited due to pain, would love to be able to play with grands    PATIENT GOALS: Pain relief   NEXT MD VISIT: 09/13/22 [name]    OBJECTIVE: (objective measures completed at initial evaluation unless otherwise dated)   DIAGNOSTIC FINDINGS:  Stenosis at L4-L5   PATIENT SURVEYS:  FOTO emailed  SCREENING FOR RED FLAGS: Bowel or bladder incontinence: Yes: not sure  Spinal tumors: No Cauda equina syndrome: No Compression fracture: No Abdominal aneurysm: No   COGNITION: Overall cognitive status: WNL                              SENSATION: WFL   MUSCLE LENGTH: Hamstrings: WFL  Thomas test: NT, appears tight     POSTURE: flexed trunk  and genu varus L knee   PALPATION: Does not tolerate    LUMBAR ROM:    AROM eval  Flexion WFL   Extension 75%  Right lateral flexion    Left lateral flexion    Right rotation 50% pain   Left rotation 50% pain    (Blank rows = not tested)   LOWER EXTREMITY ROM:      Active  Right eval Left eval  Hip flexion      Hip extension      Hip abduction      Hip adduction      Hip internal rotation WNL WNL  Hip external rotation WNL WNL   Knee flexion      Knee extension      Ankle dorsiflexion      Ankle plantarflexion      Ankle inversion      Ankle eversion       (Blank rows = not tested)   LOWER EXTREMITY MMT:     MMT Right eval Left eval  Hip flexion 3+ 4  Hip extension      Hip abduction      Hip adduction      Hip internal rotation      Hip external rotation      Knee flexion 4 5  Knee extension 4 5  Ankle dorsiflexion 4 5  Ankle plantarflexion      Ankle inversion      Ankle eversion       (Blank rows = not tested)   LUMBAR SPECIAL TESTS:  Straight leg raise test: Negative   FUNCTIONAL TESTS:  NT   GAIT: Distance walked: 75 feet Assistive device utilized: Single point cane and None Level of assistance: Modified independence Comments: NT    TODAY'S TREATMENT:   [facility] Adult PT Treatment:                                                DATE: 10/19/22 Modalities: IFC lumbar 15 min to tolerance Self Care: Discharge and recommendations to return to MD    [facility] Adult PT Treatment:                                                DATE: 5/2//24 Therapeutic Exercise: NuStep L6 UE and LE for 6 min LAQ x10 3# SKTC x3 10" LTR x5 3" Supine Core bracing c ball press , breathing  Hamstring roll in/outs c swiss ball x15 Modalities: IFC to low back for 15 min in supine, intensity as tolerated  [facility] Adult PT Treatment:  DATE: 10/07/22 Therapeutic Exercise: Supine A/P tilt legs supported on  bolster Core bracing , breathing  Partial brdige too painful Knee to chest gentle  Lower trunk rotation x 10  Figure 4 press on knee gentle in supine  S/L clam x 15  Manual Therapy: PROM hamstring stretch, hip ER and IR   Modalities: IFC for 15 min supine with cold pack  40 Mv per tolerance lumbar central spine   Self Care: Importance of gentle mobility and light movement to improve pain Avoid bedrest Ask MD about Rx for Home TENS unit     [facility] Adult PT Treatment:                                                DATE: 09/13/22 Therapeutic Exercise: NuStep L6 UE and LE for 6 min  Supine PPT x 10 with core focus Oblique isometric ball press in supine/hooklying  Knee to chest x 2  Mini bridge, cues needed  Used dynadisc under pelvis for march and clam   Modalities: IFC for 12 min to see if Home TENS unit is an option for her  28 Mv per tolerance lumbar central spine  Self Care: TENS use and contraindicatio n/indications    [facility] Adult PT Treatment:                                                DATE: 09/09/22 Therapeutic Exercise: Nustep 8 min L4 UE/LE SKTC x3 20 sec LTR in comfortable range x10 10" PPT 2x10 3" Bridge small range x15 3" HL clam x15 3" HL marching x15 3" Updated HEP                                                                                                                              DATE: 08/17/22      PATIENT EDUCATION:  Education details: POC, HEP, surgery/recovery, core and breathing  Person educated: Patient Education method: Explanation, Demonstration, and Handouts Education comprehension: verbalized understanding, verbal cues required, and needs further education   HOME EXERCISE PROGRAM: Access Code: [email or web address] URL: [email or web address] Date: 09/09/2022 Prepared by: Joellyn Rued  Exercises - Supine Lower Trunk Rotation  - 1 x daily - 7 x weekly - 2 sets - 10 reps - 10 hold - Hooklying Single Knee to Chest Stretch  - 1 x daily -  7 x weekly - 1 sets - 5 reps - 30 hold - Supine Posterior Pelvic Tilt  - 1 x daily - 7 x weekly - 2 sets - 10 reps - 10 hold - Supine Bridge  - 1 x daily - 7 x weekly - 2 sets - 10 reps - 3 hold -  Hooklying Clamshell with Resistance  - 1 x daily - 7 x weekly - 2 sets - 10 reps - 3 hold - Supine March  - 1 x daily - 7 x weekly - 2 sets - 10 reps - 3 hold   ASSESSMENT:   CLINICAL IMPRESSION: Patient was in severe pain today.  Arrived late for session so opted to just do IFC and discharge.  She is not making progress with PT. Her pain is 9/10 after taking all her pain meds.  She will follow up with Dr Lovell Sheehan to pursue other interventions.   OBJECTIVE IMPAIRMENTS: Abnormal gait, decreased activity tolerance, decreased balance, decreased endurance, decreased mobility, difficulty walking, decreased ROM, decreased strength, increased fascial restrictions, impaired flexibility, improper body mechanics, postural dysfunction, obesity, and pain.    ACTIVITY LIMITATIONS: carrying, lifting, bending, sitting, standing, squatting, sleeping, stairs, transfers, bed mobility, continence, and locomotion level   PARTICIPATION LIMITATIONS: meal prep, cleaning, laundry, interpersonal relationship, shopping, and community activity   PERSONAL FACTORS: Past/current experiences, Time since onset of injury/illness/exacerbation, and 3+ comorbidities: obesity, diabetes, smoker  are also affecting patient's functional outcome.    REHAB POTENTIAL: Fair Pt likely having surgery   CLINICAL DECISION MAKING: Stable/uncomplicated   EVALUATION COMPLEXITY: Low     GOALS:   LONG TERM GOALS: Target date: 11/04/22   Pt will be I with HEP for positioning and core, breathing Baseline: given on eval  Goal status:MET    2.  Pt will be able to get up and down from the mat without increasing back pain  Baseline: needs cues, pain  Goal status: NOT MET    3.  Pt will be able to stand for ADLs 10 minutes min increase in back  and leg pain Baseline: varies, pain increases standing 5 min  Goal status: NOT MET    4.  Patient will be able to walk in her home without increased pain for up to 10 minutes Baseline: varies, pain increase with walking 5 min  Goal status: NOT MET    5.  Patient will be screened for balance and goal set Baseline: unable on eval  Goal status: UNABLE    6.  Foto score TBA and goals set when able Baseline: emailed  Goal status: NOT MET    PLAN:   PT FREQUENCY: 1-2x/week   PT DURATION: 4 weeks-6 weeks    PLANNED INTERVENTIONS: Therapeutic exercises, Therapeutic activity, Neuromuscular re-education, Balance training, Gait training, Patient/Family education, Self Care, Joint mobilization, DME instructions, Electrical stimulation, Spinal mobilization, Cryotherapy, Moist heat, Manual therapy, and Re-evaluation.   PLAN FOR NEXT SESSION: DC>   Karie Mainland, PT 10/19/22 10:12 AM Phone: [phone] Fax: [phone]   PHYSICAL THERAPY DISCHARGE SUMMARY  Visits from Start of Care: 6  Current functional level related to goals / functional outcomes: See above     Remaining deficits: Pain, strength, gait, stiffness    Education / Equipment: HEP, core, TENS    Patient agrees to discharge. Patient goals were not met. Patient is being discharged due to did not respond to therapy.   Karie Mainland, PT 10/19/22 11:01 AM Phone: [phone] Fax: [phone]

## 2022-10-19 ENCOUNTER — Ambulatory Visit: Payer: Medicare HMO | Admitting: Physical Therapy

## 2022-10-19 ENCOUNTER — Encounter: Payer: Self-pay | Admitting: Physical Therapy

## 2022-10-19 DIAGNOSIS — M5416 Radiculopathy, lumbar region: Secondary | ICD-10-CM

## 2022-10-19 DIAGNOSIS — M5459 Other low back pain: Secondary | ICD-10-CM | POA: Diagnosis not present

## 2022-10-19 DIAGNOSIS — R293 Abnormal posture: Secondary | ICD-10-CM

## 2022-10-21 ENCOUNTER — Ambulatory Visit: Payer: Medicare HMO | Admitting: Physical Therapy

## 2022-10-21 ENCOUNTER — Encounter: Payer: Self-pay | Admitting: *Deleted

## 2022-11-03 ENCOUNTER — Ambulatory Visit: Payer: Medicare HMO | Attending: Family Medicine

## 2022-11-03 VITALS — Ht 65.0 in | Wt 214.0 lb

## 2022-11-03 DIAGNOSIS — Z1231 Encounter for screening mammogram for malignant neoplasm of breast: Secondary | ICD-10-CM

## 2022-11-03 DIAGNOSIS — Z0000 Encounter for general adult medical examination without abnormal findings: Secondary | ICD-10-CM

## 2022-11-03 NOTE — Progress Notes (Signed)
Subjective:   Caroline Williams is a 62 y.o. female who presents for Medicare Annual (Subsequent) preventive examination.  I connected with  Caroline Williams on 11/03/22 by a audio enabled telemedicine application and verified that I am speaking with the correct person using two identifiers.  Patient Location: Home  Provider Location: Home Office  I discussed the limitations of evaluation and management by telemedicine. The patient expressed understanding and agreed to proceed.  Review of Systems     Cardiac Risk Factors include: diabetes mellitus;dyslipidemia;hypertension;sedentary lifestyle     Objective:    Today's Vitals   11/03/22 2035  Weight: 214 lb (97.1 kg)  Height: 5\' 5"  (1.651 m)   Body mass index is 35.61 kg/m.     11/03/2022    8:42 PM 09/09/2021   10:39 PM 02/25/2021    4:10 PM 11/12/2019   11:57 AM 03/14/2018    7:22 AM 03/09/2018    4:29 PM 08/31/2017    7:10 AM  Advanced Directives  Does Patient Have a Medical Advance Directive? No No No No No No No  Would patient like information on creating a medical advance directive? Yes (MAU/Ambulatory/Procedural Areas - Information given)  No - Patient declined No - Patient declined       Current Medications (verified) Outpatient Encounter Medications as of 11/03/2022  Medication Sig   acetaminophen (TYLENOL) 500 MG tablet Take 1,000 mg by mouth every 6 (six) hours as needed for moderate pain or headache.   albuterol (VENTOLIN HFA) 108 (90 Base) MCG/ACT inhaler INHALE 2 PUFFS INTO THE LUNGS EVERY 6 HOURS AS NEEDED FOR WHEEZING OR SHORTNESS OF BREATH   ALPRAZolam (XANAX) 1 MG tablet Take 1 mg by mouth 3 (three) times daily as needed for anxiety.   amphetamine-dextroamphetamine (ADDERALL XR) 30 MG 24 hr capsule Take 30 mg by mouth daily.    amphetamine-dextroamphetamine (ADDERALL) 20 MG tablet Take 20 mg by mouth every evening.   Bempedoic Acid-Ezetimibe (NEXLIZET) 180-10 MG TABS Take 1 tablet by mouth daily.   Blood  Glucose Monitoring Suppl (ACCU-CHEK GUIDE) w/Device KIT 1 kit by Does not apply route 3 (three) times daily. To check blood sugars   cetirizine (ZYRTEC) 10 MG tablet Take 1 tablet (10 mg total) by mouth daily. (Patient taking differently: Take 10 mg by mouth at bedtime.)   Cyanocobalamin (VITAMIN B 12 PO) Take 1 tablet by mouth daily. Unsure of dose   dapagliflozin propanediol (FARXIGA) 5 MG TABS tablet Take 1 tablet (5 mg total) by mouth daily before breakfast.   diclofenac Sodium (VOLTAREN) 1 % GEL Apply 2-4 g topically 2 (two) times daily as needed (wrist/knee pain).   doxepin (SINEQUAN) 75 MG capsule Take 75 mg by mouth at bedtime.   fluticasone (FLONASE) 50 MCG/ACT nasal spray SHAKE LIQUID AND USE 2 SPRAYS IN EACH NOSTRIL DAILY   glucose blood (ACCU-CHEK GUIDE) test strip Use as instructed   hydrocortisone-pramoxine (ANALPRAM HC) 2.5-1 % rectal cream Place 1 application rectally 3 (three) times daily. (Patient taking differently: Place 1 application  rectally 2 (two) times daily as needed for hemorrhoids.)   isosorbide mononitrate (IMDUR) 30 MG 24 hr tablet Take 1 tablet (30 mg total) by mouth daily.   Lancets (ACCU-CHEK MULTICLIX) lancets Use as instructed   levothyroxine (SYNTHROID) 150 MCG tablet TAKE 1 TABLET(150 MCG) BY MOUTH DAILY BEFORE BREAKFAST   linaclotide (LINZESS) 290 MCG CAPS capsule Take 290 mcg by mouth See admin instructions. Every other night   methocarbamol (ROBAXIN) 500 MG  tablet Take 1 tablet (500 mg total) by mouth every 8 (eight) hours as needed for muscle spasms.   morphine (MS CONTIN) 15 MG 12 hr tablet Take 1 tablet (15 mg total) by mouth every 12 (twelve) hours.   nabumetone (RELAFEN) 750 MG tablet Take 750 mg by mouth 2 (two) times daily.   naloxone (NARCAN) nasal spray 4 mg/0.1 mL Place 1 spray into the nose as needed (overdose).   nitroGLYCERIN (NITROSTAT) 0.4 MG SL tablet DISSOLVE 1 TABLET UNDER THE TONGUE EVERY 5 MINUTES AS NEEDED (Patient taking differently:  Place 0.4 mg under the tongue every 5 (five) minutes as needed for chest pain.)   Olmesartan-amLODIPine-HCTZ 40-5-25 MG TABS Take 1 tablet by mouth daily.   omeprazole (PRILOSEC) 40 MG capsule TAKE 1 CAPSULE(40 MG) BY MOUTH DAILY (Patient taking differently: Take 40 mg by mouth daily.)   Oxycodone HCl 10 MG TABS Take 1 tablet (10 mg total) by mouth every 6 (six) hours as needed (for pain). (Patient taking differently: Take 10 mg by mouth 3 (three) times daily.)   PRALUENT 150 MG/ML SOAJ INJECT 1 PEN INTO SKIN EVERY 14 DAYS. (Patient taking differently: Inject 150 mg into the skin every 14 (fourteen) days.)   promethazine (PHENERGAN) 25 MG tablet Take 1 tablet (25 mg total) by mouth every 8 (eight) hours as needed for nausea or vomiting.   solifenacin (VESICARE) 5 MG tablet Take 1 tablet (5 mg total) by mouth daily.   spironolactone (ALDACTONE) 25 MG tablet Take 0.5 tablets (12.5 mg total) by mouth daily.   umeclidinium-vilanterol (ANORO ELLIPTA) 62.5-25 MCG/INH AEPB Inhale 1 puff into the lungs daily.   vortioxetine HBr (TRINTELLIX) 20 MG TABS tablet Take 20 mg by mouth daily.   [DISCONTINUED] ciclopirox (PENLAC) 8 % solution Apply 1 application  topically See admin instructions. Qd x 6 days, then remove with alcohol, then repeat   [DISCONTINUED] escitalopram (LEXAPRO) 20 MG tablet Take 20 mg by mouth daily.   [DISCONTINUED] LATUDA 120 MG TABS Take 120 mg by mouth at bedtime.    [DISCONTINUED] LISINOPRIL PO Take by mouth daily.     [DISCONTINUED] metoCLOPramide (REGLAN) 10 MG tablet Take 1 tablet (10 mg total) by mouth every 8 (eight) hours as needed for nausea. (Patient not taking: Reported on 12/06/2019)   [DISCONTINUED] predniSONE (DELTASONE) 10 MG tablet Take 3 tablets (30 mg total) by mouth daily with breakfast for 3 days, THEN 2 tablets (20 mg total) daily with breakfast for 3 days, then 1 tablet (10 mg total) daily with breakfast for 3 days.   [DISCONTINUED] VRAYLAR 6 MG CAPS Take 6 mg by mouth  at bedtime.   No facility-administered encounter medications on file as of 11/03/2022.    Allergies (verified) Aspirin, Bee venom, Sulfa antibiotics, Other, and Repatha [evolocumab]   History: Past Medical History:  Diagnosis Date   Anxiety    Arthritis    Asthma    Bipolar 1 disorder (HCC)    Breast discharge    Breast lump    Breast pain    Chronic pain    on MS Contin and oxycodone   Depression    Diabetes mellitus    diet and exercise controlled   Escherichia coli (E. coli) infection    Fever    GERD (gastroesophageal reflux disease)    History of chest pain    History of kidney stones    History of knee replacement, total    Hypertension    Hypothyroidism    N&V (  nausea and vomiting)    Peripheral neuropathy    Right foot no sensation   Pneumonia    2015,2014   Sciatica    Sleep apnea    does not use CPAP   Thyroid disease    Wears glasses    Past Surgical History:  Procedure Laterality Date   ABDOMINAL HYSTERECTOMY     partial   APPENDECTOMY     BIOPSY  03/14/2018   Procedure: BIOPSY;  Surgeon: Charlott Rakes, MD;  Location: [facility];  Service: Endoscopy;;   BREAST DUCTAL SYSTEM EXCISION Right 08/31/2017   Procedure: RIGHT BREAST CENTRAL DUCT EXCISION;  Surgeon: Harriette Bouillon, MD;  Location: [facility];  Service: General;  Laterality: Right;   BREAST EXCISIONAL BIOPSY Right    BREAST LUMPECTOMY     right   CARDIAC CATHETERIZATION     no significant CAD, nl LV function by 11/08/06 cath   CESAREAN SECTION     x4   COLONOSCOPY WITH PROPOFOL N/A 03/14/2018   Procedure: COLONOSCOPY WITH PROPOFOL;  Surgeon: Charlott Rakes, MD;  Location: [facility];  Service: Endoscopy;  Laterality: N/A;   GASTRIC ROUX-EN-Y N/A 11/25/2014   Procedure: LAPAROSCOPIC ROUX-EN-Y GASTRIC BYPASS WITH UPPER ENDOSCOPY;  Surgeon: Luretha Murphy, MD;  Location: [facility];  Service: General;  Laterality: N/A;   HERNIA REPAIR     JOINT REPLACEMENT     KNEE  SURGERY     right   MOUTH SURGERY     MYOMECTOMY ABDOMINAL APPROACH  1989   POLYPECTOMY  03/14/2018   Procedure: POLYPECTOMY;  Surgeon: Charlott Rakes, MD;  Location: [facility];  Service: Endoscopy;;   TOTAL KNEE REVISION  06/21/2012   Procedure: TOTAL KNEE REVISION;  Surgeon: Nadara Mustard, MD;  Location: [facility];  Service: Orthopedics;  Laterality: Right;  Revision Right Total Knee Arthroplasty   Family History  Problem Relation Age of Onset   Cancer Father        colon   Stroke Father    Heart disease Father    Diabetes Father    Hypertension Father    Depression Sister    Hypertension Mother    Breast cancer Neg Hx    Social History   Socioeconomic History   Marital status: Single    Spouse name: Not on file   Number of children: Not on file   Years of education: Not on file   Highest education level: Not on file  Occupational History   Not on file  Tobacco Use   Smoking status: Former    Packs/day: 0.25    Years: 43.00    Additional pack years: 0.00    Total pack years: 10.75    Types: Cigarettes    Quit date: 2020    Years since quitting: 4.3   Smokeless tobacco: Never  Vaping Use   Vaping Use: Never used  Substance and Sexual Activity   Alcohol use: Yes    Comment: social   Drug use: No   Sexual activity: Yes    Birth control/protection: Surgical  Other Topics Concern   Not on file  Social History Narrative   Not on file   Social Determinants of Health   Financial Resource Strain: Medium Risk (11/03/2022)   Overall Financial Resource Strain (CARDIA)    Difficulty of Paying Living Expenses: Somewhat hard  Food Insecurity: Food Insecurity Present (11/03/2022)   Hunger Vital Sign    Worried About Running Out of Food in the Last Year: Sometimes true  Ran Out of Food in the Last Year: Sometimes true  Transportation Needs: Unmet Transportation Needs (11/03/2022)   PRAPARE - Transportation    Lack of Transportation (Medical): Yes    Lack of  Transportation (Non-Medical): Yes  Physical Activity: Inactive (11/03/2022)   Exercise Vital Sign    Days of Exercise per Week: 0 days    Minutes of Exercise per Session: 0 min  Stress: Stress Concern Present (11/03/2022)   [employer] of Occupational Health - Occupational Stress Questionnaire    Feeling of Stress : To some extent  Social Connections: Moderately Integrated (11/03/2022)   Social Connection and Isolation Panel [NHANES]    Frequency of Communication with Friends and Family: More than three times a week    Frequency of Social Gatherings with Friends and Family: Once a week    Attends Religious Services: 1 to 4 times per year    Active Member of [employer] or Organizations: No    Attends Engineer, structural: Never    Marital Status: Living with partner    Tobacco Counseling Counseling given: Not Answered   Clinical Intake:  Pre-visit preparation completed: Yes  Pain : No/denies pain     Diabetes: Yes CBG done?: No Did pt. bring in CBG monitor from home?: No  How often do you need to have someone help you when you read instructions, pamphlets, or other written materials from your doctor or pharmacy?: 1 - Never  Diabetic?Yes   Nutrition Risk Assessment:  Has the patient had any N/V/D within the last 2 months?  No  Does the patient have any non-healing wounds?  No  Has the patient had any unintentional weight loss or weight gain?  No   Diabetes:  Is the patient diabetic?  Yes  If diabetic, was a CBG obtained today?  No  Did the patient bring in their glucometer from home?  No  How often do you monitor your CBG's? Currently not .   Financial Strains and Diabetes Management:  Are you having any financial strains with the device, your supplies or your medication? Yes .  Does the patient want to be seen by Chronic Care Management for management of their diabetes?  No  Would the patient like to be referred to a Nutritionist or for Diabetic  Management?  Yes   Diabetic Exams:  Diabetic Eye Exam: Completed 08/17/22 Diabetic Foot Exam: Overdue, Pt has been advised about the importance in completing this exam. Pt is scheduled for diabetic foot exam on at next office visit .   Interpreter Needed?: No  Information entered by :: Kandis Fantasia LPN   Activities of Daily Living    11/03/2022    8:41 PM 11/03/2022    8:42 AM  In your present state of health, do you have any difficulty performing the following activities:  Hearing? 1 1  Vision? 0 0  Difficulty concentrating or making decisions? 1 1  Walking or climbing stairs? 1 1  Dressing or bathing? 0 0  Doing errands, shopping? 1 1  Preparing Food and eating ? N N  Using the Toilet? N N  In the past six months, have you accidently leaked urine? Y Y  Do you have problems with loss of bowel control? N N  Managing your Medications? N N  Managing your Finances? N N  Housekeeping or managing your Housekeeping? Malvin Johns    Patient Care Team: Hoy Register, MD as PCP - General (Family Medicine)  Indicate any recent Medical Services  you may have received from other than [facility] providers in the past year (date may be approximate).     Assessment:   This is a routine wellness examination for Raine.  Hearing/Vision screen Hearing Screening - Comments:: Denies hearing difficulties   Vision Screening - Comments:: Wears rx glasses - up to date with routine eye exams with Dr. Ander Purpura     Dietary issues and exercise activities discussed: Current Exercise Habits: The patient does not participate in regular exercise at present   Goals Addressed             This Visit's Progress    Decrease back pain       Prevent falls        Depression Screen    11/03/2022    8:43 PM 10/07/2022    2:01 PM 07/13/2021   10:03 AM 04/07/2021   11:19 AM 02/25/2021    4:11 PM 02/25/2021    4:05 PM 10/13/2020   10:58 AM  PHQ 2/9 Scores  PHQ - 2 Score 3 3 0  0 0 0  PHQ- 9 Score 4 4 3        Exception Documentation    Patient refusal       Fall Risk    11/03/2022    8:41 PM 11/03/2022    8:42 AM 10/07/2022    2:01 PM 07/13/2021    9:50 AM 04/07/2021   11:11 AM  Fall Risk   Falls in the past year? 1 1 1 1  0  Number falls in past yr: 1 1 1  0 0  Injury with Fall? 0 0 0 0 0  Risk for fall due to : History of fall(s);Impaired balance/gait;Impaired mobility  History of fall(s)    Follow up Falls prevention discussed;Education provided;Falls evaluation completed        FALL RISK PREVENTION PERTAINING TO THE HOME:  Any stairs in or around the home? No  If so, are there any without handrails? No  Home free of loose throw rugs in walkways, pet beds, electrical cords, etc? Yes  Adequate lighting in your home to reduce risk of falls? Yes   ASSISTIVE DEVICES UTILIZED TO PREVENT FALLS:  Life alert? No  Use of a cane, walker or w/c? Yes  Grab bars in the bathroom? Yes  Shower chair or bench in shower? Yes  Elevated toilet seat or a handicapped toilet? Yes   TIMED UP AND GO:  Was the test performed? No . Telephonic visit   Cognitive Function:        11/03/2022    8:42 PM 02/25/2021    4:12 PM  6CIT Screen  What Year? 0 points 0 points  What month? 0 points 0 points  What time? 0 points 0 points  Count back from 20 0 points 2 points  Months in reverse 2 points 2 points  Repeat phrase 2 points 0 points  Total Score 4 points 4 points    Immunizations Immunization History  Administered Date(s) Administered   Influenza Inj Mdck Quad Pf 02/26/2019   Influenza,inj,Quad PF,6+ Mos 03/13/2015, 03/13/2018, 04/07/2021   Influenza,inj,quad, With Preservative 03/13/2018, 02/26/2019   Moderna Covid-19 Vaccine Bivalent Booster 51yrs & up 05/01/2020   Moderna Sars-Covid-2 Vaccination 08/21/2019, 09/18/2019   PNEUMOCOCCAL CONJUGATE-20 04/07/2021   Tdap 09/09/2021   Zoster Recombinat (Shingrix) 08/25/2018    TDAP status: Up to date  Pneumococcal vaccine status: Up to  date  Covid-19 vaccine status: Information provided on how to obtain vaccines.  Qualifies for Shingles Vaccine? Yes   Zostavax completed No   Shingrix Completed?: No.    Education has been provided regarding the importance of this vaccine. Patient has been advised to call insurance company to determine out of pocket expense if they have not yet received this vaccine. Advised may also receive vaccine at local pharmacy or Health Dept. Verbalized acceptance and understanding.  Screening Tests Health Maintenance  Topic Date Due   FOOT EXAM  Never done   Zoster Vaccines- Shingrix (2 of 2) 10/20/2018   COVID-19 Vaccine (4 - 2023-24 season) 02/12/2022   Diabetic kidney evaluation - Urine ACR  04/07/2022   INFLUENZA VACCINE  01/13/2023   HEMOGLOBIN A1C  04/08/2023   OPHTHALMOLOGY EXAM  08/17/2023   Diabetic kidney evaluation - eGFR measurement  10/07/2023   Medicare Annual Wellness (AWV)  11/03/2023   MAMMOGRAM  11/28/2023   COLONOSCOPY (Pts 45-72yrs Insurance coverage will need to be confirmed)  03/14/2028   DTaP/Tdap/Td (2 - Td or Tdap) 09/10/2031   Hepatitis C Screening  Completed   HIV Screening  Completed   HPV VACCINES  Aged Out   PAP SMEAR-Modifier  Discontinued    Health Maintenance  Health Maintenance Due  Topic Date Due   FOOT EXAM  Never done   Zoster Vaccines- Shingrix (2 of 2) 10/20/2018   COVID-19 Vaccine (4 - 2023-24 season) 02/12/2022   Diabetic kidney evaluation - Urine ACR  04/07/2022    Colorectal cancer screening: Type of screening: Colonoscopy. Completed 03/14/18. Repeat every 10 years  Mammogram status: Completed 11/27/21. Repeat every year  Lung Cancer Screening: (Low Dose CT Chest recommended if Age 28-80 years, 30 pack-year currently smoking OR have quit w/in 15years.) does not qualify.   Lung Cancer Screening Referral: n/a  Additional Screening:  Hepatitis C Screening: does qualify; Completed 04/07/21  Vision Screening: Recommended annual  ophthalmology exams for early detection of glaucoma and other disorders of the eye. Is the patient up to date with their annual eye exam?  Yes  Who is the provider or what is the name of the office in which the patient attends annual eye exams? Dr. Yetta Barre  If pt is not established with a provider, would they like to be referred to a provider to establish care? No .   Dental Screening: Recommended annual dental exams for proper oral hygiene  Community Resource Referral / Chronic Care Management: CRR required this visit?  No   CCM required this visit?  No      Plan:     I have personally reviewed and noted the following in the patient's chart:   Medical and social history Use of alcohol, tobacco or illicit drugs  Current medications and supplements including opioid prescriptions. Patient is currently taking opioid prescriptions. Information provided to patient regarding non-opioid alternatives. Patient advised to discuss non-opioid treatment plan with their provider. Functional ability and status Nutritional status Physical activity Advanced directives List of other physicians Hospitalizations, surgeries, and ER visits in previous 12 months Vitals Screenings to include cognitive, depression, and falls Referrals and appointments  In addition, I have reviewed and discussed with patient certain preventive protocols, quality metrics, and best practice recommendations. A written personalized care plan for preventive services as well as general preventive health recommendations were provided to patient.     Durwin Nora, [place]   1/61/0960   Due to this being a virtual visit, the after visit summary with patients personalized plan was offered to patient via mail or my-chart.  Patient would like to access on my-chart  Nurse Notes: See telephone note

## 2022-11-03 NOTE — Patient Instructions (Signed)
Caroline Williams , Thank you for taking time to come for your Medicare Wellness Visit. I appreciate your ongoing commitment to your health goals. Please review the following plan we discussed and let me know if I can assist you in the future.   These are the goals we discussed:  Goals      Decrease back pain     Prevent falls        This is a list of the screening recommended for you and due dates:  Health Maintenance  Topic Date Due   Complete foot exam   Never done   Zoster (Shingles) Vaccine (2 of 2) 10/20/2018   COVID-19 Vaccine (4 - 2023-24 season) 02/12/2022   Yearly kidney health urinalysis for diabetes  04/07/2022   Flu Shot  01/13/2023   Hemoglobin A1C  04/08/2023   Eye exam for diabetics  08/17/2023   Yearly kidney function blood test for diabetes  10/07/2023   Medicare Annual Wellness Visit  11/03/2023   Mammogram  11/28/2023   Colon Cancer Screening  03/14/2028   DTaP/Tdap/Td vaccine (2 - Td or Tdap) 09/10/2031   Hepatitis C Screening: USPSTF Recommendation to screen - Ages 7-79 yo.  Completed   HIV Screening  Completed   HPV Vaccine  Aged Out   Pap Smear  Discontinued    Advanced directives: Information on Advanced Care Planning can be found at [facility] of [place] Advance Health Care Directives Advance Health Care Directives ([email or web address]    Conditions/risks identified: Aim for 30 minutes of exercise or brisk walking, 6-8 glasses of water, and 5 servings of fruits and vegetables each day.   Next appointment: Follow up in one year for your annual wellness visit.   The number to schedule your mammogram at [facility] is [phone]   Preventive Care 40-64 Years, Female Preventive care refers to lifestyle choices and visits with your health care provider that can promote health and wellness. What does preventive care include? A yearly physical exam. This is also called an annual well check. Dental exams once or twice a year. Routine eye exams. Ask  your health care provider how often you should have your eyes checked. Personal lifestyle choices, including: Daily care of your teeth and gums. Regular physical activity. Eating a healthy diet. Avoiding tobacco and drug use. Limiting alcohol use. Practicing safe sex. Taking low-dose aspirin daily starting at age 30. Taking vitamin and mineral supplements as recommended by your health care provider. What happens during an annual well check? The services and screenings done by your health care provider during your annual well check will depend on your age, overall health, lifestyle risk factors, and family history of disease. Counseling  Your health care provider may ask you questions about your: Alcohol use. Tobacco use. Drug use. Emotional well-being. Home and relationship well-being. Sexual activity. Eating habits. Work and work Astron[employer]. Method of birth control. Menstrual cycle. Pregnancy history. Screening  You may have the following tests or measurements: Height, weight, and BMI. Blood pressure. Lipid and cholesterol levels. These may be checked every 5 years, or more frequently if you are over 22 years old. Skin check. Lung cancer screening. You may have this screening every year starting at age 48 if you have a 30-pack-year history of smoking and currently smoke or have quit within the past 15 years. Fecal occult blood test (FOBT) of the stool. You may have this test every year starting at age 72. Flexible sigmoidoscopy or colonoscopy. You may have  a sigmoidoscopy every 5 years or a colonoscopy every 10 years starting at age 60. Hepatitis C blood test. Hepatitis B blood test. Sexually transmitted disease (STD) testing. Diabetes screening. This is done by checking your blood sugar (glucose) after you have not eaten for a while (fasting). You may have this done every 1-3 years. Mammogram. This may be done every 1-2 years. Talk to your health care provider about when you  should start having regular mammograms. This may depend on whether you have a family history of breast cancer. BRCA-related cancer screening. This may be done if you have a family history of breast, ovarian, tubal, or peritoneal cancers. Pelvic exam and Pap test. This may be done every 3 years starting at age 14. Starting at age 66, this may be done every 5 years if you have a Pap test in combination with an HPV test. Bone density scan. This is done to screen for osteoporosis. You may have this scan if you are at high risk for osteoporosis. Discuss your test results, treatment options, and if necessary, the need for more tests with your health care provider. Vaccines  Your health care provider may recommend certain vaccines, such as: Influenza vaccine. This is recommended every year. Tetanus, diphtheria, and acellular pertussis (Tdap, Td) vaccine. You may need a Td booster every 10 years. Zoster vaccine. You may need this after age 90. Pneumococcal 13-valent conjugate (PCV13) vaccine. You may need this if you have certain conditions and were not previously vaccinated. Pneumococcal polysaccharide (PPSV23) vaccine. You may need one or two doses if you smoke cigarettes or if you have certain conditions. Talk to your health care provider about which screenings and vaccines you need and how often you need them. This information is not intended to replace advice given to you by your health care provider. Make sure you discuss any questions you have with your health care provider. Document Released: 06/27/2015 Document Revised: 02/18/2016 Document Reviewed: 04/01/2015 Elsevier Interactive Patient Education  2017 [employer].    Fall Prevention in the Home Falls can cause injuries. They can happen to people of all ages. There are many things you can do to make your home safe and to help prevent falls. What can I do on the outside of my home? Regularly fix the edges of walkways and driveways and fix  any cracks. Remove anything that might make you trip as you walk through a door, such as a raised step or threshold. Trim any bushes or trees on the path to your home. Use bright outdoor lighting. Clear any walking paths of anything that might make someone trip, such as rocks or tools. Regularly check to see if handrails are loose or broken. Make sure that both sides of any steps have handrails. Any raised decks and porches should have guardrails on the edges. Have any leaves, snow, or ice cleared regularly. Use sand or salt on walking paths during winter. Clean up any spills in your garage right away. This includes oil or grease spills. What can I do in the bathroom? Use night lights. Install grab bars by the toilet and in the tub and shower. Do not use towel bars as grab bars. Use non-skid mats or decals in the tub or shower. If you need to sit down in the shower, use a plastic, non-slip stool. Keep the floor dry. Clean up any water that spills on the floor as soon as it happens. Remove soap buildup in the tub or shower regularly. Attach bath  mats securely with double-sided non-slip rug tape. Do not have throw rugs and other things on the floor that can make you trip. What can I do in the bedroom? Use night lights. Make sure that you have a light by your bed that is easy to reach. Do not use any sheets or blankets that are too big for your bed. They should not hang down onto the floor. Have a firm chair that has side arms. You can use this for support while you get dressed. Do not have throw rugs and other things on the floor that can make you trip. What can I do in the kitchen? Clean up any spills right away. Avoid walking on wet floors. Keep items that you use a lot in easy-to-reach places. If you need to reach something above you, use a strong step stool that has a grab bar. Keep electrical cords out of the way. Do not use floor polish or wax that makes floors slippery. If you  must use wax, use non-skid floor wax. Do not have throw rugs and other things on the floor that can make you trip. What can I do with my stairs? Do not leave any items on the stairs. Make sure that there are handrails on both sides of the stairs and use them. Fix handrails that are broken or loose. Make sure that handrails are as long as the stairways. Check any carpeting to make sure that it is firmly attached to the stairs. Fix any carpet that is loose or worn. Avoid having throw rugs at the top or bottom of the stairs. If you do have throw rugs, attach them to the floor with carpet tape. Make sure that you have a light switch at the top of the stairs and the bottom of the stairs. If you do not have them, ask someone to add them for you. What else can I do to help prevent falls? Wear shoes that: Do not have high heels. Have rubber bottoms. Are comfortable and fit you well. Are closed at the toe. Do not wear sandals. If you use a stepladder: Make sure that it is fully opened. Do not climb a closed stepladder. Make sure that both sides of the stepladder are locked into place. Ask someone to hold it for you, if possible. Clearly mark and make sure that you can see: Any grab bars or handrails. First and last steps. Where the edge of each step is. Use tools that help you move around (mobility aids) if they are needed. These include: Canes. Walkers. Scooters. Crutches. Turn on the lights when you go into a dark area. Replace any light bulbs as soon as they burn out. Set up your furniture so you have a clear path. Avoid moving your furniture around. If any of your floors are uneven, fix them. If there are any pets around you, be aware of where they are. Review your medicines with your doctor. Some medicines can make you feel dizzy. This can increase your chance of falling. Ask your doctor what other things that you can do to help prevent falls. This information is not intended to replace  advice given to you by your health care provider. Make sure you discuss any questions you have with your health care provider. Document Released: 03/27/2009 Document Revised: 11/06/2015 Document Reviewed: 07/05/2014 Elsevier Interactive Patient Education  2017 [employer].

## 2022-11-13 DIAGNOSIS — R32 Unspecified urinary incontinence: Secondary | ICD-10-CM | POA: Diagnosis not present

## 2022-11-16 DIAGNOSIS — M48062 Spinal stenosis, lumbar region with neurogenic claudication: Secondary | ICD-10-CM | POA: Diagnosis not present

## 2022-11-16 DIAGNOSIS — M5136 Other intervertebral disc degeneration, lumbar region: Secondary | ICD-10-CM | POA: Diagnosis not present

## 2022-11-16 DIAGNOSIS — M5416 Radiculopathy, lumbar region: Secondary | ICD-10-CM | POA: Diagnosis not present

## 2022-11-16 DIAGNOSIS — M4316 Spondylolisthesis, lumbar region: Secondary | ICD-10-CM | POA: Diagnosis not present

## 2022-11-18 ENCOUNTER — Telehealth: Payer: Self-pay | Admitting: *Deleted

## 2022-11-18 ENCOUNTER — Other Ambulatory Visit: Payer: Self-pay | Admitting: Neurosurgery

## 2022-11-18 NOTE — Telephone Encounter (Signed)
I called pt to set up in office appt, however pt vmbox was full.

## 2022-11-18 NOTE — Telephone Encounter (Signed)
   Name: Caroline Williams  DOB: 1960/06/24  MRN: [id number]  Primary Cardiologist: None  Chart reviewed as part of pre-operative protocol coverage. Because of Staisha Vicary Laidler's past medical history and time since last visit, she will require a follow-up in-office visit in order to better assess preoperative cardiovascular risk.  Pre-op covering staff: - Please schedule appointment and call patient to inform them. If patient already had an upcoming appointment within acceptable timeframe, please add "pre-op clearance" to the appointment notes so provider is aware. - Please contact requesting surgeon's office via preferred method (i.e, phone, fax) to inform them of need for appointment prior to surgery.  No medications indicated as needing held.  Sharlene Dory, PA-C  11/18/2022, 10:17 AM

## 2022-11-18 NOTE — Telephone Encounter (Signed)
   Pre-operative Risk Assessment    Patient Name: Caroline Williams  DOB: 1961-01-01 MRN: [id number]      Request for Surgical Clearance    Procedure:   Lumbar Fusion  Date of Surgery:  Clearance 01/12/23                                 Surgeon:  Dr. Tressie Stalker Surgeon's Group or Practice Name:  [facility] Phone number:  [phone] Fax number:  [phone]   Type of Clearance Requested:   - Medical    Type of Anesthesia:  General    Additional requests/questions:    Signed, Emmit Pomfret   11/18/2022, 10:04 AM

## 2022-11-19 NOTE — Telephone Encounter (Signed)
Pt is schedule with Robin Searing, NP on 6/19 at 9:10am. I will forward to provider

## 2022-11-30 NOTE — Progress Notes (Deleted)
Office Visit    Patient Name: Caroline Williams Date of Encounter: 11/30/2022  Primary Care Provider:  Hoy Register, MD Primary Cardiologist:  None Primary Electrophysiologist: None   Past Medical History    Past Medical History:  Diagnosis Date   Anxiety    Arthritis    Asthma    Bipolar 1 disorder (HCC)    Breast discharge    Breast lump    Breast pain    Chronic pain    on MS Contin and oxycodone   Depression    Diabetes mellitus    diet and exercise controlled   Escherichia coli (E. coli) infection    Fever    GERD (gastroesophageal reflux disease)    History of chest pain    History of kidney stones    History of knee replacement, total    Hypertension    Hypothyroidism    N&V (nausea and vomiting)    Peripheral neuropathy    Right foot no sensation   Pneumonia    2015,2014   Sciatica    Sleep apnea    does not use CPAP   Thyroid disease    Wears glasses    Past Surgical History:  Procedure Laterality Date   ABDOMINAL HYSTERECTOMY     partial   APPENDECTOMY     BIOPSY  03/14/2018   Procedure: BIOPSY;  Surgeon: Charlott Rakes, MD;  Location: [facility];  Service: Endoscopy;;   BREAST DUCTAL SYSTEM EXCISION Right 08/31/2017   Procedure: RIGHT BREAST CENTRAL DUCT EXCISION;  Surgeon: Harriette Bouillon, MD;  Location: [facility];  Service: General;  Laterality: Right;   BREAST EXCISIONAL BIOPSY Right    BREAST LUMPECTOMY     right   CARDIAC CATHETERIZATION     no significant CAD, nl LV function by 11/08/06 cath   CESAREAN SECTION     x4   COLONOSCOPY WITH PROPOFOL N/A 03/14/2018   Procedure: COLONOSCOPY WITH PROPOFOL;  Surgeon: Charlott Rakes, MD;  Location: [facility];  Service: Endoscopy;  Laterality: N/A;   GASTRIC ROUX-EN-Y N/A 11/25/2014   Procedure: LAPAROSCOPIC ROUX-EN-Y GASTRIC BYPASS WITH UPPER ENDOSCOPY;  Surgeon: Luretha Murphy, MD;  Location: [facility];  Service: General;  Laterality: N/A;   HERNIA REPAIR     JOINT  REPLACEMENT     KNEE SURGERY     right   MOUTH SURGERY     MYOMECTOMY ABDOMINAL APPROACH  1989   POLYPECTOMY  03/14/2018   Procedure: POLYPECTOMY;  Surgeon: Charlott Rakes, MD;  Location: [facility];  Service: Endoscopy;;   TOTAL KNEE REVISION  06/21/2012   Procedure: TOTAL KNEE REVISION;  Surgeon: Nadara Mustard, MD;  Location: [facility];  Service: Orthopedics;  Laterality: Right;  Revision Right Total Knee Arthroplasty    Allergies  Allergies  Allergen Reactions   Aspirin Shortness Of Breath and Other (See Comments)    Pt states she has had Toradol several times without any reactions   Bee Venom Anaphylaxis   Sulfa Antibiotics Anaphylaxis   Other Other (See Comments)    No blood products.  Patient did  Request that only albumin or albumin-containing products may be administered   Repatha [Evolocumab]      History of Present Illness    Caroline Williams  is a 62 year old female with a PMH of nonobstructive CAD, HTN, HLD, OSA, hypothyroidism, GERD, DM type II, bipolar 1 disorder, statin intolerance (on Repatha) former tobacco abuse who presents today for preoperative clearance.  Caroline Williams was seen initially by Dr. Katrinka Blazing in 2017 for management of chest pain.  She completed a Lexiscan Myoview that was normal and a low risk.  She completed a coronary CTA in 2021 that showed calcium score of 49 with nonobstructive CAD present.  She completed her most recent ischemic evaluation 12/2020 and was once again normal with no perfusion defects and low risk.  She was last seen by Dr. Katrinka Blazing on 08/27/2019 with complaint of atypical chest pain.  Consideration was made for repeat coronary CTA with nephrology to evaluate due to patient's family history of atherosclerosis.   Since last being seen in the office patient reports***.  Patient denies chest pain, palpitations, dyspnea, PND, orthopnea, nausea, vomiting, dizziness, syncope, edema, weight gain, or early satiety.     ***Notes: Lumbar  fusion-patient former Dr. Katrinka Blazing patient. Home Medications    Current Outpatient Medications  Medication Sig Dispense Refill   acetaminophen (TYLENOL) 500 MG tablet Take 1,000 mg by mouth every 6 (six) hours as needed for moderate pain or headache.     albuterol (VENTOLIN HFA) 108 (90 Base) MCG/ACT inhaler INHALE 2 PUFFS INTO THE LUNGS EVERY 6 HOURS AS NEEDED FOR WHEEZING OR SHORTNESS OF BREATH 20.1 g 1   ALPRAZolam (XANAX) 1 MG tablet Take 1 mg by mouth 3 (three) times daily as needed for anxiety.  0   amphetamine-dextroamphetamine (ADDERALL XR) 30 MG 24 hr capsule Take 30 mg by mouth daily.      amphetamine-dextroamphetamine (ADDERALL) 20 MG tablet Take 20 mg by mouth every evening.  0   Bempedoic Acid-Ezetimibe (NEXLIZET) 180-10 MG TABS Take 1 tablet by mouth daily. 90 tablet 3   Blood Glucose Monitoring Suppl (ACCU-CHEK GUIDE) w/Device KIT 1 kit by Does not apply route 3 (three) times daily. To check blood sugars 1 kit 0   cetirizine (ZYRTEC) 10 MG tablet Take 1 tablet (10 mg total) by mouth daily. (Patient taking differently: Take 10 mg by mouth at bedtime.) 90 tablet 1   Cyanocobalamin (VITAMIN B 12 PO) Take 1 tablet by mouth daily. Unsure of dose     dapagliflozin propanediol (FARXIGA) 5 MG TABS tablet Take 1 tablet (5 mg total) by mouth daily before breakfast. 90 tablet 1   diclofenac Sodium (VOLTAREN) 1 % GEL Apply 2-4 g topically 2 (two) times daily as needed (wrist/knee pain). 100 g 1   doxepin (SINEQUAN) 75 MG capsule Take 75 mg by mouth at bedtime.  0   fluticasone (FLONASE) 50 MCG/ACT nasal spray SHAKE LIQUID AND USE 2 SPRAYS IN EACH NOSTRIL DAILY 16 g 2   glucose blood (ACCU-CHEK GUIDE) test strip Use as instructed 100 each 12   hydrocortisone-pramoxine (ANALPRAM HC) 2.5-1 % rectal cream Place 1 application rectally 3 (three) times daily. (Patient taking differently: Place 1 application  rectally 2 (two) times daily as needed for hemorrhoids.) 30 g 2   isosorbide mononitrate (IMDUR)  30 MG 24 hr tablet Take 1 tablet (30 mg total) by mouth daily. 90 tablet 3   Lancets (ACCU-CHEK MULTICLIX) lancets Use as instructed 100 each 12   levothyroxine (SYNTHROID) 150 MCG tablet TAKE 1 TABLET(150 MCG) BY MOUTH DAILY BEFORE BREAKFAST 30 tablet 0   linaclotide (LINZESS) 290 MCG CAPS capsule Take 290 mcg by mouth See admin instructions. Every other night     methocarbamol (ROBAXIN) 500 MG tablet Take 1 tablet (500 mg total) by mouth every 8 (eight) hours as needed for muscle spasms. 15 tablet 0   morphine (MS  CONTIN) 15 MG 12 hr tablet Take 1 tablet (15 mg total) by mouth every 12 (twelve) hours. 60 tablet 0   nabumetone (RELAFEN) 750 MG tablet Take 750 mg by mouth 2 (two) times daily.     naloxone (NARCAN) nasal spray 4 mg/0.1 mL Place 1 spray into the nose as needed (overdose).     nitroGLYCERIN (NITROSTAT) 0.4 MG SL tablet DISSOLVE 1 TABLET UNDER THE TONGUE EVERY 5 MINUTES AS NEEDED (Patient taking differently: Place 0.4 mg under the tongue every 5 (five) minutes as needed for chest pain.) 25 tablet 3   Olmesartan-amLODIPine-HCTZ 40-5-25 MG TABS Take 1 tablet by mouth daily. 15 tablet 0   omeprazole (PRILOSEC) 40 MG capsule TAKE 1 CAPSULE(40 MG) BY MOUTH DAILY (Patient taking differently: Take 40 mg by mouth daily.) 90 capsule 1   Oxycodone HCl 10 MG TABS Take 1 tablet (10 mg total) by mouth every 6 (six) hours as needed (for pain). (Patient taking differently: Take 10 mg by mouth 3 (three) times daily.) 120 tablet 0   PRALUENT 150 MG/ML SOAJ INJECT 1 PEN INTO SKIN EVERY 14 DAYS. (Patient taking differently: Inject 150 mg into the skin every 14 (fourteen) days.) 6 mL 3   promethazine (PHENERGAN) 25 MG tablet Take 1 tablet (25 mg total) by mouth every 8 (eight) hours as needed for nausea or vomiting. 20 tablet 0   solifenacin (VESICARE) 5 MG tablet Take 1 tablet (5 mg total) by mouth daily. 90 tablet 1   spironolactone (ALDACTONE) 25 MG tablet Take 0.5 tablets (12.5 mg total) by mouth daily.  45 tablet 1   umeclidinium-vilanterol (ANORO ELLIPTA) 62.5-25 MCG/INH AEPB Inhale 1 puff into the lungs daily. 1 each 0   vortioxetine HBr (TRINTELLIX) 20 MG TABS tablet Take 20 mg by mouth daily.     No current facility-administered medications for this visit.     Review of Systems  Please see the history of present illness.    (+)*** (+)***  All other systems reviewed and are otherwise negative except as noted above.  Physical Exam    Wt Readings from Last 3 Encounters:  11/03/22 214 lb (97.1 kg)  10/07/22 214 lb 12.8 oz (97.4 kg)  03/03/22 205 lb (93 kg)   WU:JWJXB were no vitals filed for this visit.,There is no height or weight on file to calculate BMI.  Constitutional:      Appearance: Healthy appearance. Not in distress.  Neck:     Vascular: JVD normal.  Pulmonary:     Effort: Pulmonary effort is normal.     Breath sounds: No wheezing. No rales. Diminished in the bases Cardiovascular:     Normal rate. Regular rhythm. Normal S1. Normal S2.      Murmurs: There is no murmur.  Edema:    Peripheral edema absent.  Abdominal:     Palpations: Abdomen is soft non tender. There is no hepatomegaly.  Skin:    General: Skin is warm and dry.  Neurological:     General: No focal deficit present.     Mental Status: Alert and oriented to person, place and time.     Cranial Nerves: Cranial nerves are intact.  EKG/LABS/ Recent Cardiac Studies    ECG personally reviewed by me today - ***  Cardiac Studies & Procedures     STRESS TESTS  MYOCARDIAL PERFUSION IMAGING 12/31/2020  Narrative  Nuclear stress EF: 45%. Generalized decreased wall motion  The left ventricular ejection fraction is mildly decreased (45-54%).  There was  no ST segment deviation noted during stress.  The study is normal.  There are no perfusion defects suggestive of ischemia or infarction.  This is a low risk study.  Consider echocardiogram to verify mildly reduced ejection fraction.      CT  SCANS  CT CORONARY MORPH W/CTA COR W/SCORE 10/05/2019  Addendum 10/05/2019 11:11 AM ADDENDUM REPORT: 10/05/2019 11:09  CLINICAL DATA:  Chest pain  EXAM: Cardiac CTA  MEDICATIONS: Sub lingual nitro. 4 mg  TECHNIQUE: The patient was scanned on a [employer] 192 scanner. Gantry rotation speed was 250 msecs. Collimation was. 6 mm . A 120 kV prospective scan was triggered in the ascending thoracic aorta at 140 HU's with full mA between 30-70% of the R-R interval . Average HR during the scan was 56 bpm. The 3D data set was interpreted on a dedicated work station using MPR, MIP and VRT modes. A total of 80 cc of contrast was used.  FINDINGS: Non-cardiac: See separate report from [facility]. No significant findings on limited lung and soft tissue windows.  Calcium score: Calcium noted in LM  Coronary Arteries: Right dominant with no anomalies  LM: 1-24% mixed plaque in mid and distal portion  LAD: Normal  D1: Normal  D2: Normal  Circumflex: Normal  OM1: Normal  OM2: Normal  RCA: Normal  PDA: Normal  PLA: Normal  IMPRESSION: 1. Calcium score 49 This is 88 th percentile for age and sex  2.  Normal aortic root diameter 3.4 cm  3.  CAD RADS 1 non obstructive CAD see description above  Charlton Haws   Electronically Signed By: Charlton Haws M.D. On: 10/05/2019 11:09  Narrative EXAM: OVER-READ INTERPRETATION  CT CHEST  The following report is an over-read performed by radiologist Dr. Trudie Reed of [facility] on 10/05/2019. This over-read does not include interpretation of cardiac or coronary anatomy or pathology. The coronary calcium score/coronary CTA interpretation by the cardiologist is attached.  COMPARISON:  None.  FINDINGS: Within the visualized portions of the thorax there are no suspicious appearing pulmonary nodules or masses, there is no acute consolidative airspace disease, no pleural effusions, no pneumothorax  and no lymphadenopathy. Visualized portions of the upper abdomen are unremarkable. There are no aggressive appearing lytic or blastic lesions noted in the visualized portions of the skeleton.  IMPRESSION: 1. No significant incidental noncardiac findings are noted.  Electronically Signed: By: Trudie Reed M.D. On: 10/05/2019 10:22          Risk Assessment/Calculations:   {Does this patient have ATRIAL FIBRILLATION?:[phone]}        Lab Results  Component Value Date   WBC 5.6 05/21/2021   HGB 13.3 09/09/2021   HCT 39.0 09/09/2021   MCV 83 05/21/2021   PLT 503 (H) 05/21/2021   Lab Results  Component Value Date   CREATININE 1.38 (H) 10/07/2022   BUN 19 10/07/2022   NA 138 10/07/2022   K 3.7 10/07/2022   CL 96 10/07/2022   CO2 25 10/07/2022   Lab Results  Component Value Date   ALT 26 10/07/2022   AST 34 10/07/2022   ALKPHOS 138 (H) 10/07/2022   BILITOT 0.2 10/07/2022   Lab Results  Component Value Date   CHOL 280 (H) 04/07/2021   HDL 87 04/07/2021   LDLCALC 184 (H) 04/07/2021   LDLDIRECT 84 04/10/2020   TRIG 61 04/07/2021   CHOLHDL 1.6 07/14/2020    Lab Results  Component Value Date   HGBA1C 6.5 10/07/2022  Assessment & Plan    1.  Preoperative clearance:   2.  Nonobstructive atherosclerosis:  3.  Essential hypertension:  4.  Hyperlipidemia:  5.  DM type II:      Disposition: Follow-up with None or APP in *** months {Are you ordering a CV Procedure (e.g. stress test, cath, DCCV, TEE, etc)?   Press F2        :[id number]}   Medication Adjustments/Labs and Tests Ordered: Current medicines are reviewed at length with the patient today.  Concerns regarding medicines are outlined above.   Signed, Napoleon Form, Leodis Rains, NP 11/30/2022, 8:16 PM [facility]

## 2022-12-01 ENCOUNTER — Ambulatory Visit (INDEPENDENT_AMBULATORY_CARE_PROVIDER_SITE_OTHER): Payer: Medicare HMO | Admitting: Nurse Practitioner

## 2022-12-01 DIAGNOSIS — E1140 Type 2 diabetes mellitus with diabetic neuropathy, unspecified: Secondary | ICD-10-CM

## 2022-12-01 DIAGNOSIS — Z01810 Encounter for preprocedural cardiovascular examination: Secondary | ICD-10-CM

## 2022-12-01 DIAGNOSIS — I10 Essential (primary) hypertension: Secondary | ICD-10-CM

## 2022-12-01 DIAGNOSIS — I2510 Atherosclerotic heart disease of native coronary artery without angina pectoris: Secondary | ICD-10-CM

## 2022-12-01 DIAGNOSIS — E785 Hyperlipidemia, unspecified: Secondary | ICD-10-CM

## 2022-12-02 DIAGNOSIS — Z01810 Encounter for preprocedural cardiovascular examination: Secondary | ICD-10-CM | POA: Insufficient documentation

## 2022-12-02 NOTE — Progress Notes (Addendum)
Cardiology Office Note:    Date:  12/03/2022  ID:  Caroline Williams, DOB [birth year], MRN [id number] PCP: Hoy Register, MD  [facility] Providers Cardiologist:  Orbie Pyo, MD Cardiology APP:  Beatrice Lecher, PA-C       Patient Profile:      Nonobstructive coronary artery disease, chest pain    Cath 2008: no CAD  CCTA 10/05/19: CAC score 49, 88th percentile, nonobstructive CAD (LM 1-24 mid and dist) Myoview 12/31/20: EF 45, no ischemia or infarction, low risk  Diabetes mellitus Hypertension  Hyperlipidemia  Intol of statins  Hypothyroidism  Chronic Obstructive Pulmonary Disease  Tobacco use  OSA      History of Present Illness:   Caroline Williams is a 62 y.o. female who returns for surgical clearance. She was last seen by Dr. Katrinka Blazing in 08/2019. She needs a lumbar fusion with Dr. Lovell Sheehan 01/12/23 under gen anesthesia. She is here alone. Her back limits her fairly significantly. She cannot achieve 4 METs. She has occasional exertional chest pain described as pressure. She has no radiating symptoms. She does not shortness of breath with exertion and with bending over. She has not had syncope. She sleeps on 3 pillows chronically. She notes some ankle edema that resolves with elevation.   Review of Systems  Gastrointestinal:  Negative for hematochezia and melena.  Genitourinary:  Negative for hematuria.   See HPI    Studies Reviewed:   EKG Interpretation  Date/Time:  Friday December 03 2022 08:03:26 EDT Ventricular Rate:  76 PR Interval:  174 QRS Duration: 94 QT Interval:  428 QTC Calculation: 481 R Axis:   -17 Text Interpretation: Sinus rhythm with frequent Premature ventricular complexes in a pattern of bigeminy Poor R wave progression When compared with ECG of 09-Sep-2021 20:35, No significant change was found Confirmed by Tereso Newcomer [phone]) on 12/03/2022 8:09:01 AM     Risk Assessment/Calculations:             Physical Exam:   VS:  BP 119/72   Pulse 76   Ht  5\' 5"  (1.651 m)   Wt 222 lb 3.2 oz (100.8 kg)   SpO2 92%   BMI 36.98 kg/m    Wt Readings from Last 3 Encounters:  12/03/22 222 lb 3.2 oz (100.8 kg)  11/03/22 214 lb (97.1 kg)  10/07/22 214 lb 12.8 oz (97.4 kg)    Constitutional:      Appearance: Healthy appearance. Not in distress.  Neck:     Vascular: No carotid bruit. JVD normal.  Pulmonary:     Breath sounds: Normal breath sounds. No wheezing. No rales.  Cardiovascular:     Normal rate. Irregular rhythm.     Murmurs: There is no murmur.  Edema:    Peripheral edema present.    Ankle: bilateral trace edema of the ankle. Abdominal:     Palpations: Abdomen is soft.       ASSESSMENT AND PLAN:   Exertional chest pain She has a hx of elevated Ca2+ in the past and min nonobstructive CAD on CCTA. She has a hx of diabetes mellitus. She also smokes. She has significant hx of coronary artery disease. She is limited by back pain and cannot walk on a treadmill. I recommend a stress test prior to giving clearance for surgery. She agrees with this plan.  Arrange Lexiscan Myoview  PVC's (premature ventricular contractions) She has bigeminy on EKG today. An old EKG did as well. Myoview in 2022 showed EF  45. She has shortness of breath but this is likely related to deconditioning and Chronic Obstructive Pulmonary Disease, ongoing tobacco use. She does not have signs of volume excess on exam. However, she does have shortness of breath with bending over which could indicate volume excess. I recommend evaluating her PVC burden and to rule out cardiomyopathy. 3 day Zio to assess PVC burden Echocardiogram to r/o structural heart disease   Preoperative cardiovascular examination Her risk of major cardiac event according to the revised cardiac risk index is low at 0.4%.  However, she does have exertional chest discomfort.  She requires stress testing prior to providing clearance for surgery.  Lexiscan Myoview be arranged.  She will also undergo  echocardiogram to further evaluate shortness of breath and heart monitor to assess PVC burden.  Further recommendations to follow.  CAD (coronary artery disease) Cardiac cath in 2008 without CAD.  CCTA in 2021 with minimal nonobstructive disease and calcium score 49 placing her in the 88th percentile.  Myoview in 2022 was low risk and negative for ischemia.  She does note exertional chest discomfort.  Proceed with stress testing as outlined.  Refill Praluent.  Discuss initiation of antiplatelet therapy at follow-up.  Continue Imdur 30 mg daily, nitroglycerin as needed.  Essential (primary) hypertension Blood pressure is controlled.  Continue Imdur 30 mg daily, olmesartan/amlodipine/HCTZ 40/5/25 mg daily, spironolactone 25 mg daily.  Hyperlipidemia She has not taken Praluent in some time.  Refill Praluent.  Arrange fasting CMET, lipids in 3 months.  ADDENDUM: Recent echocardiogram demonstrated low normal LV function with EF 50-55%.  There was mild to moderate mitral regurgitation.  Nuclear stress test demonstrated no ischemia. The patient may proceed with her lumbar spine surgery at acceptable risk.    Informed Consent   Shared Decision Making/Informed Consent The risks [chest pain, shortness of breath, cardiac arrhythmias, dizziness, blood pressure fluctuations, myocardial infarction, stroke/transient ischemic attack, nausea, vomiting, allergic reaction, radiation exposure, metallic taste sensation and life-threatening complications (estimated to be 1 in 10,000)], benefits (risk stratification, diagnosing coronary artery disease, treatment guidance) and alternatives of a nuclear stress test were discussed in detail with Caroline Williams and she agrees to proceed.     Dispo:  Return in about 6 months (around 06/04/2023) for Routine Follow Up, w/ Dr. Lynnette Caffey, or Tereso Newcomer, PA-C.  Signed, Tereso Newcomer, PA-C

## 2022-12-03 ENCOUNTER — Ambulatory Visit (INDEPENDENT_AMBULATORY_CARE_PROVIDER_SITE_OTHER): Payer: Medicare HMO

## 2022-12-03 ENCOUNTER — Encounter: Payer: Self-pay | Admitting: Physician Assistant

## 2022-12-03 ENCOUNTER — Ambulatory Visit: Payer: Medicare HMO | Attending: Nurse Practitioner | Admitting: Physician Assistant

## 2022-12-03 VITALS — BP 119/72 | HR 76 | Ht 65.0 in | Wt 222.2 lb

## 2022-12-03 DIAGNOSIS — I493 Ventricular premature depolarization: Secondary | ICD-10-CM | POA: Diagnosis not present

## 2022-12-03 DIAGNOSIS — E7800 Pure hypercholesterolemia, unspecified: Secondary | ICD-10-CM

## 2022-12-03 DIAGNOSIS — I2510 Atherosclerotic heart disease of native coronary artery without angina pectoris: Secondary | ICD-10-CM

## 2022-12-03 DIAGNOSIS — Z01810 Encounter for preprocedural cardiovascular examination: Secondary | ICD-10-CM | POA: Diagnosis not present

## 2022-12-03 DIAGNOSIS — I10 Essential (primary) hypertension: Secondary | ICD-10-CM | POA: Diagnosis not present

## 2022-12-03 DIAGNOSIS — R079 Chest pain, unspecified: Secondary | ICD-10-CM | POA: Diagnosis not present

## 2022-12-03 DIAGNOSIS — I25119 Atherosclerotic heart disease of native coronary artery with unspecified angina pectoris: Secondary | ICD-10-CM | POA: Diagnosis not present

## 2022-12-03 HISTORY — DX: Atherosclerotic heart disease of native coronary artery without angina pectoris: I25.10

## 2022-12-03 MED ORDER — NITROGLYCERIN 0.4 MG SL SUBL: SUBLINGUAL_TABLET | SUBLINGUAL | 3 refills | Status: DC

## 2022-12-03 MED ORDER — PRALUENT 150 MG/ML ~~LOC~~ SOAJ: SUBCUTANEOUS | 3 refills | Status: DC

## 2022-12-03 NOTE — Assessment & Plan Note (Signed)
She has bigeminy on EKG today. An old EKG did as well. Myoview in 2022 showed EF 45. She has shortness of breath but this is likely related to deconditioning and Chronic Obstructive Pulmonary Disease, ongoing tobacco use. She does not have signs of volume excess on exam. However, she does have shortness of breath with bending over which could indicate volume excess. I recommend evaluating her PVC burden and to rule out cardiomyopathy. 3 day Zio to assess PVC burden Echocardiogram to r/o structural heart disease

## 2022-12-03 NOTE — Assessment & Plan Note (Signed)
Blood pressure is controlled.  Continue Imdur 30 mg daily, olmesartan/amlodipine/HCTZ 40/5/25 mg daily, spironolactone 25 mg daily.

## 2022-12-03 NOTE — Assessment & Plan Note (Signed)
She has not taken Praluent in some time.  Refill Praluent.  Arrange fasting CMET, lipids in 3 months.

## 2022-12-03 NOTE — Addendum Note (Signed)
Addended by: Burnetta Sabin on: 12/03/2022 08:53 AM   Modules accepted: Orders

## 2022-12-03 NOTE — Progress Notes (Unsigned)
Enrolled for [employer] to mail a ZIO XT long term holter monitor to the patients address on file.   Dr. Thukkani to read. 

## 2022-12-03 NOTE — Patient Instructions (Signed)
Medication Instructions:  Your physician recommends that you continue on your current medications as directed. Please refer to the Current Medication list given to you today.  *If you need a refill on your cardiac medications before your next appointment, please call your pharmacy*   Lab Work: 3 MONTHS:  FASTING LIPID & CMET  If you have labs (blood work) drawn today and your tests are completely normal, you will receive your results only by: MyChart Message (if you have MyChart) OR A paper copy in the mail If you have any lab test that is abnormal or we need to change your treatment, we will call you to review the results.   Testing/Procedures: Your physician has requested that you have an echocardiogram. Echocardiography is a painless test that uses sound waves to create images of your heart. It provides your doctor with information about the size and shape of your heart and how well your heart's chambers and valves are working. This procedure takes approximately one hour. There are no restrictions for this procedure. Please do NOT wear cologne, perfume, aftershave, or lotions (deodorant is allowed). Please arrive 15 minutes prior to your appointment time.   Your physician has requested that you have a lexiscan myoview. For further information please visit [email or web address] Please follow instruction sheet, BELOW:    You are scheduled for a Myocardial Perfusion Imaging Study  Please arrive 15 minutes prior to your appointment time for registration and insurance purposes.  The test will take approximately 3 to 4 hours to complete; you may bring reading material.  If someone comes with you to your appointment, they will need to remain in the main lobby due to limited space in the testing area. **If you are pregnant or breastfeeding, please notify the nuclear lab prior to your appointment**  How to prepare for your Myocardial Perfusion Test: Do not eat or drink 3 hours prior to your  test, except you may have water. Do not consume products containing caffeine (regular or decaffeinated) 12 hours prior to your test. (ex: coffee, chocolate, sodas, tea). Do bring a list of your current medications with you.  If not listed below, you may take your medications as normal. Do wear comfortable clothes (no dresses or overalls) and walking shoes, tennis shoes preferred (No heels or open toe shoes are allowed). Do NOT wear cologne, perfume, aftershave, or lotions (deodorant is allowed). If these instructions are not followed, your test will have to be rescheduled.    ZIO XT- Long Term Monitor Instructions  Your physician has requested you wear a ZIO patch monitor for 3 days.  This is a single patch monitor. [employer] supplies one patch monitor per enrollment. Additional stickers are not available. Please do not apply patch if you will be having a Nuclear Stress Test,  Echocardiogram, Cardiac CT, MRI, or Chest Xray during the period you would be wearing the  monitor. The patch cannot be worn during these tests. You cannot remove and re-apply the  ZIO XT patch monitor.  Your ZIO patch monitor will be mailed 3 day USPS to your address on file. It may take 3-5 days  to receive your monitor after you have been enrolled.  Once you have received your monitor, please review the enclosed instructions. Your monitor  has already been registered assigning a specific monitor serial # to you.  Billing and Patient Assistance Program Information  We have supplied [employer] with any of your insurance information on file for billing purposes. [employer] offers a sliding scale  Patient Assistance Program for patients that do not have  insurance, or whose insurance does not completely cover the cost of the ZIO monitor.  You must apply for the Patient Assistance Program to qualify for this discounted rate.  To apply, please call [employer] at [phone], select option 4, select option 2, ask to apply for   Patient Assistance Program. Meredeth Ide will ask your household income, and how many people  are in your household. They will quote your out-of-pocket cost based on that information.  [employer] will also be able to set up a 52-month, interest-free payment plan if needed.  Applying the monitor   Shave hair from upper left chest.  Hold abrader disc by orange tab. Rub abrader in 40 strokes over the upper left chest as  indicated in your monitor instructions.  Clean area with 4 enclosed alcohol pads. Let dry.  Apply patch as indicated in monitor instructions. Patch will be placed under collarbone on left  side of chest with arrow pointing upward.  Rub patch adhesive wings for 2 minutes. Remove white label marked "1". Remove the white  label marked "2". Rub patch adhesive wings for 2 additional minutes.  While looking in a mirror, press and release button in center of patch. A small green light will  flash 3-4 times. This will be your only indicator that the monitor has been turned on.  Do not shower for the first 24 hours. You may shower after the first 24 hours.  Press the button if you feel a symptom. You will hear a small click. Record Date, Time and  Symptom in the Patient Logbook.  When you are ready to remove the patch, follow instructions on the last 2 pages of Patient  Logbook. Stick patch monitor onto the last page of Patient Logbook.  Place Patient Logbook in the blue and white box. Use locking tab on box and tape box closed  securely. The blue and white box has prepaid postage on it. Please place it in the mailbox as  soon as possible. Your physician should have your test results approximately 7 days after the  monitor has been mailed back to [facility].  Call [facility] Customer Care at [phone] if you have questions regarding  your ZIO XT patch monitor. Call them immediately if you see an orange light blinking on your  monitor.  If your monitor falls off in less than 4  days, contact our Monitor department at [phone].  If your monitor becomes loose or falls off after 4 days call [employer] at [phone] for  suggestions on securing your monitor    Follow-Up: At [facility], you and your health needs are our priority.  As part of our continuing mission to provide you with exceptional heart care, we have created designated Provider Care Teams.  These Care Teams include your primary Cardiologist (physician) and Advanced Practice Providers (APPs -  Physician Assistants and Nurse Practitioners) who all work together to provide you with the care you need, when you need it.  We recommend signing up for the patient portal called "MyChart".  Sign up information is provided on this After Visit Summary.  MyChart is used to connect with patients for Virtual Visits (Telemedicine).  Patients are able to view lab/test results, encounter notes, upcoming appointments, etc.  Non-urgent messages can be sent to your provider as well.   To learn more about what you can do with MyChart, go to [email or web address].    Your next appointment:  6 month(s)  Provider:   Alverda Skeans, MD OR Tereso Newcomer, PA-C     Other Instructions

## 2022-12-03 NOTE — Assessment & Plan Note (Addendum)
Cardiac cath in 2008 without CAD.  CCTA in 2021 with minimal nonobstructive disease and calcium score 49 placing her in the 88th percentile.  Myoview in 2022 was low risk and negative for ischemia.  She does note exertional chest discomfort.  Proceed with stress testing as outlined.  Refill Praluent.  Discuss initiation of antiplatelet therapy at follow-up.  Continue Imdur 30 mg daily, nitroglycerin as needed.

## 2022-12-03 NOTE — Assessment & Plan Note (Signed)
Her risk of major cardiac event according to the revised cardiac risk index is low at 0.4%.  However, she does have exertional chest discomfort.  She requires stress testing prior to providing clearance for surgery.  Lexiscan Myoview be arranged.  She will also undergo echocardiogram to further evaluate shortness of breath and heart monitor to assess PVC burden.  Further recommendations to follow.

## 2022-12-03 NOTE — Assessment & Plan Note (Signed)
She has a hx of elevated Ca2+ in the past and min nonobstructive CAD on CCTA. She has a hx of diabetes mellitus. She also smokes. She has significant hx of coronary artery disease. She is limited by back pain and cannot walk on a treadmill. I recommend a stress test prior to giving clearance for surgery. She agrees with this plan.  Arrange [employer]

## 2022-12-08 ENCOUNTER — Other Ambulatory Visit: Payer: Self-pay | Admitting: Pharmacist

## 2022-12-08 DIAGNOSIS — Z01810 Encounter for preprocedural cardiovascular examination: Secondary | ICD-10-CM

## 2022-12-08 DIAGNOSIS — I493 Ventricular premature depolarization: Secondary | ICD-10-CM

## 2022-12-08 DIAGNOSIS — E1159 Type 2 diabetes mellitus with other circulatory complications: Secondary | ICD-10-CM

## 2022-12-08 MED ORDER — OLMESARTAN-AMLODIPINE-HCTZ 40-5-25 MG PO TABS: 1.0000 | ORAL_TABLET | Freq: Every day | ORAL | 0 refills | Status: DC

## 2022-12-08 MED ORDER — SPIRONOLACTONE 25 MG PO TABS
12.5000 mg | ORAL_TABLET | Freq: Every day | ORAL | 0 refills | Status: DC
Start: 2022-12-08 — End: 2023-03-29

## 2022-12-08 NOTE — Progress Notes (Signed)
error 

## 2022-12-09 ENCOUNTER — Encounter: Payer: Self-pay | Admitting: Physician Assistant

## 2022-12-09 DIAGNOSIS — R079 Chest pain, unspecified: Secondary | ICD-10-CM

## 2022-12-13 DIAGNOSIS — R32 Unspecified urinary incontinence: Secondary | ICD-10-CM | POA: Diagnosis not present

## 2022-12-15 ENCOUNTER — Ambulatory Visit: Payer: Medicare HMO | Attending: Physician Assistant

## 2022-12-15 ENCOUNTER — Telehealth: Payer: Self-pay

## 2022-12-15 DIAGNOSIS — I471 Supraventricular tachycardia, unspecified: Secondary | ICD-10-CM

## 2022-12-15 DIAGNOSIS — I493 Ventricular premature depolarization: Secondary | ICD-10-CM

## 2022-12-15 DIAGNOSIS — R002 Palpitations: Secondary | ICD-10-CM

## 2022-12-15 DIAGNOSIS — Z01810 Encounter for preprocedural cardiovascular examination: Secondary | ICD-10-CM | POA: Diagnosis not present

## 2022-12-15 MED ORDER — METOPROLOL SUCCINATE ER 25 MG PO TB24: 25.0000 mg | ORAL_TABLET | Freq: Every day | ORAL | 3 refills | Status: DC

## 2022-12-15 NOTE — Progress Notes (Unsigned)
Enrolled for [employer] to mail a ZIO XT long term holter monitor to the patients address on file. Enrolled to mail 03/17/2023.  Dr. Lynnette Caffey to read.

## 2022-12-15 NOTE — Telephone Encounter (Signed)
The patient has been notified of the result and verbalized understanding.  All questions (if any) were answered. Ethelda Chick, RN 12/15/2022 4:53 PM   Placed order for metoprolol and 3 day zio patch.

## 2022-12-15 NOTE — Telephone Encounter (Signed)
-----   Message from Beatrice Lecher, [place] sent at 12/15/2022  4:40 PM EDT ----- Results sent to Caroline Williams via MyChart. See MyChart comments below. Copy sent to Hoy Register, MD as Lorain Childes PLAN:  -Metoprolol succinate 25 mg once daily  -Zio XT x 3 days in 2-3 mos (PVCs)  [name]  Your heart monitor shows frequent PVCs (premature ventricular contractions).  In a 24-hour period, 13.1% of your heartbeats are PVCs.  Otherwise, you had mainly normal sinus rhythm.  There were a couple of episodes of fast heartbeats called SVT or supraventricular tachycardia.  However, these were brief with the longest episode only lasting 12 beats.  I will start you on a medication called metoprolol succinate 25 mg daily.  This is a beta-blocker which should help suppress PVCs and fast heartbeats.  We will plan on repeating your monitor in a couple of months to see if the PVCs have been adequately suppressed. Tereso Newcomer, PA-C

## 2022-12-20 ENCOUNTER — Other Ambulatory Visit: Payer: Self-pay | Admitting: Neurosurgery

## 2022-12-21 ENCOUNTER — Other Ambulatory Visit: Payer: Self-pay | Admitting: Family Medicine

## 2022-12-21 DIAGNOSIS — J3089 Other allergic rhinitis: Secondary | ICD-10-CM

## 2022-12-23 ENCOUNTER — Telehealth (HOSPITAL_COMMUNITY): Payer: Self-pay

## 2022-12-24 ENCOUNTER — Telehealth: Payer: Medicare HMO | Admitting: Family Medicine

## 2022-12-24 ENCOUNTER — Ambulatory Visit: Payer: Self-pay

## 2022-12-24 DIAGNOSIS — R110 Nausea: Secondary | ICD-10-CM

## 2022-12-24 MED ORDER — PROMETHAZINE HCL 25 MG PO TABS: 25.0000 mg | ORAL_TABLET | Freq: Three times a day (TID) | ORAL | 0 refills | Status: DC | PRN

## 2022-12-24 NOTE — Telephone Encounter (Signed)
Patient called, unable to leave voicemail due to inbox being full.  Summary: medication request   Pt called saying she is having a lot of nausea from her medications and ask if she could get some promethazine [facility] on [address]  CB#  [phone]

## 2022-12-24 NOTE — Progress Notes (Signed)
Virtual Visit Consent   Caroline Williams, you are scheduled for a virtual visit with a [facility] provider today. Just as with appointments in the office, your consent must be obtained to participate. Your consent will be active for this visit and any virtual visit you may have with one of our providers in the next 365 days. If you have a MyChart account, a copy of this consent can be sent to you electronically.  As this is a virtual visit, video technology does not allow for your provider to perform a traditional examination. This may limit your provider's ability to fully assess your condition. If your provider identifies any concerns that need to be evaluated in person or the need to arrange testing (such as labs, EKG, etc.), we will make arrangements to do so. Although advances in technology are sophisticated, we cannot ensure that it will always work on either your end or our end. If the connection with a video visit is poor, the visit may have to be switched to a telephone visit. With either a video or telephone visit, we are not always able to ensure that we have a secure connection.  By engaging in this virtual visit, you consent to the provision of healthcare and authorize for your insurance to be billed (if applicable) for the services provided during this visit. Depending on your insurance coverage, you may receive a charge related to this service.  I need to obtain your verbal consent now. Are you willing to proceed with your visit today? Caroline Williams has provided verbal consent on 12/24/2022 for a virtual visit (video or telephone). Caroline Curio, FNP  Date: 12/24/2022 5:56 PM  Virtual Visit via Video Note   I, Caroline Williams, connected with  Caroline Williams  ([id number], 11-14-1960) on 12/24/22 at  5:15 PM EDT by a video-enabled telemedicine application and verified that I am speaking with the correct person using two identifiers.  Location: Patient: Virtual Visit Location Patient:  Home Provider: Virtual Visit Location Provider: Home Office   I discussed the limitations of evaluation and management by telemedicine and the availability of in person appointments. The patient expressed understanding and agreed to proceed.    History of Present Illness: Caroline Williams is a 62 y.o. who identifies as a female who was assigned female at birth, and is being seen today for nausea due to meds. She requests refills on promethazine. Denies vomiting, fever, chills and abd pain. She reports this is a chronic problem. Marland Kitchen  HPI: HPI  Problems:  Patient Active Problem List   Diagnosis Date Noted   PVC's (premature ventricular contractions) 12/03/2022   CAD (coronary artery disease) 12/03/2022   Preoperative cardiovascular examination 12/02/2022   Bipolar 1 disorder (HCC)    Degeneration of lumbar intervertebral disc 08/07/2020   Essential (primary) hypertension 05/07/2020   Other chronic pain 02/14/2020   Elevated blood-pressure reading, without diagnosis of hypertension 02/13/2020   Hyperlipidemia 09/17/2019   COPD mixed type (HCC) 04/17/2019   Tobacco user 04/17/2019   Nocturnal hypoxemia 12/25/2018   Muscle weakness 12/05/2018   Change in bowel habits 03/14/2018   Exertional chest pain 01/26/2016   Hallucination, drug-induced (HCC) 03/12/2015   Hypothyroidism 03/12/2015   Chronic low back pain 01/29/2015   Lumbar spondylosis 01/29/2015   Lap gastric bypass June 2016 11/25/2014   Diabetes mellitus with neuropathy (HCC) 06/20/2013   Chronic pain of right knee 06/20/2013   Morbid obesity (HCC) 07/05/2012    Allergies:  Allergies  Allergen Reactions   Aspirin Other (See Comments), Shortness Of Breath and Anaphylaxis    Pt states she has had Toradol several times without any reactions  Other Reaction(s): Other (See Comments), respiratory distress   Bee Venom Anaphylaxis   Sulfa Antibiotics Anaphylaxis   Evolocumab     Other Reaction(s): Unknown   Other Other (See  Comments)    No blood products.  Patient did  Request that only albumin or albumin-containing products may be administered   Medications:  Current Outpatient Medications:    acetaminophen (TYLENOL) 500 MG tablet, Take 1,000 mg by mouth every 6 (six) hours as needed for moderate pain or headache., Disp: , Rfl:    albuterol (VENTOLIN HFA) 108 (90 Base) MCG/ACT inhaler, INHALE 2 PUFFS INTO THE LUNGS EVERY 6 HOURS AS NEEDED FOR WHEEZING OR SHORTNESS OF BREATH, Disp: 20.1 g, Rfl: 1   Alirocumab (PRALUENT) 150 MG/ML SOAJ, INJECT 1 PEN INTO SKIN EVERY 14 DAYS., Disp: 6 mL, Rfl: 3   ALPRAZolam (XANAX) 1 MG tablet, Take 1 mg by mouth 3 (three) times daily as needed for anxiety., Disp: , Rfl: 0   amphetamine-dextroamphetamine (ADDERALL XR) 30 MG 24 hr capsule, Take 30 mg by mouth daily. , Disp: , Rfl:    amphetamine-dextroamphetamine (ADDERALL) 20 MG tablet, Take 20 mg by mouth every evening., Disp: , Rfl: 0   Blood Glucose Monitoring Suppl (ACCU-CHEK GUIDE) w/Device KIT, 1 kit by Does not apply route 3 (three) times daily. To check blood sugars, Disp: 1 kit, Rfl: 0   cetirizine (ZYRTEC) 10 MG tablet, Take 1 tablet (10 mg total) by mouth daily., Disp: 90 tablet, Rfl: 1   Cyanocobalamin (VITAMIN B 12 PO), Take 1 tablet by mouth daily. Unsure of dose, Disp: , Rfl:    dapagliflozin propanediol (FARXIGA) 5 MG TABS tablet, Take 1 tablet (5 mg total) by mouth daily before breakfast., Disp: 90 tablet, Rfl: 1   diclofenac Sodium (VOLTAREN) 1 % GEL, Apply 2-4 g topically 2 (two) times daily as needed (wrist/knee pain)., Disp: 100 g, Rfl: 1   doxepin (SINEQUAN) 75 MG capsule, Take 75 mg by mouth at bedtime., Disp: , Rfl: 0   fluticasone (FLONASE) 50 MCG/ACT nasal spray, SHAKE LIQUID AND USE TWO SPRAYS IN EACH NOSTRIL DAILY, Disp: 16 g, Rfl: 0   glucose blood (ACCU-CHEK GUIDE) test strip, Use as instructed, Disp: 100 each, Rfl: 12   hydrocortisone-pramoxine (ANALPRAM HC) 2.5-1 % rectal cream, Place 1 application  rectally 3 (three) times daily., Disp: 30 g, Rfl: 2   isosorbide mononitrate (IMDUR) 30 MG 24 hr tablet, Take 1 tablet (30 mg total) by mouth daily., Disp: 90 tablet, Rfl: 3   Lancets (ACCU-CHEK MULTICLIX) lancets, Use as instructed, Disp: 100 each, Rfl: 12   levothyroxine (SYNTHROID) 150 MCG tablet, TAKE 1 TABLET(150 MCG) BY MOUTH DAILY BEFORE BREAKFAST, Disp: 30 tablet, Rfl: 0   linaclotide (LINZESS) 290 MCG CAPS capsule, Take 290 mcg by mouth See admin instructions. Every other night, Disp: , Rfl:    methocarbamol (ROBAXIN) 500 MG tablet, Take 1 tablet (500 mg total) by mouth every 8 (eight) hours as needed for muscle spasms., Disp: 15 tablet, Rfl: 0   metoprolol succinate (TOPROL XL) 25 MG 24 hr tablet, Take 1 tablet (25 mg total) by mouth daily., Disp: 90 tablet, Rfl: 3   morphine (MS CONTIN) 15 MG 12 hr tablet, Take 1 tablet (15 mg total) by mouth every 12 (twelve) hours., Disp: 60 tablet, Rfl: 0   nabumetone (RELAFEN)  750 MG tablet, Take 750 mg by mouth 2 (two) times daily., Disp: , Rfl:    naloxone (NARCAN) nasal spray 4 mg/0.1 mL, Place 1 spray into the nose as needed (overdose)., Disp: , Rfl:    nitroGLYCERIN (NITROSTAT) 0.4 MG SL tablet, DISSOLVE 1 TABLET UNDER THE TONGUE EVERY 5 MINUTES AS NEEDED, Disp: 25 tablet, Rfl: 3   Olmesartan-amLODIPine-HCTZ 40-5-25 MG TABS, Take 1 tablet by mouth daily., Disp: 90 tablet, Rfl: 0   omeprazole (PRILOSEC) 40 MG capsule, TAKE 1 CAPSULE(40 MG) BY MOUTH DAILY, Disp: 90 capsule, Rfl: 1   Oxycodone HCl 10 MG TABS, Take 1 tablet (10 mg total) by mouth every 6 (six) hours as needed (for pain)., Disp: 120 tablet, Rfl: 0   promethazine (PHENERGAN) 25 MG tablet, Take 1 tablet (25 mg total) by mouth every 8 (eight) hours as needed for nausea or vomiting., Disp: 20 tablet, Rfl: 0   REXULTI 2 MG TABS tablet, Take 2 mg by mouth daily., Disp: , Rfl:    solifenacin (VESICARE) 5 MG tablet, Take 1 tablet (5 mg total) by mouth daily., Disp: 90 tablet, Rfl: 1    spironolactone (ALDACTONE) 25 MG tablet, Take 0.5 tablets (12.5 mg total) by mouth daily., Disp: 45 tablet, Rfl: 0   umeclidinium-vilanterol (ANORO ELLIPTA) 62.5-25 MCG/INH AEPB, Inhale 1 puff into the lungs daily., Disp: 1 each, Rfl: 0   vortioxetine HBr (TRINTELLIX) 20 MG TABS tablet, Take 20 mg by mouth daily., Disp: , Rfl:   Observations/Objective: Patient is well-developed, well-nourished in no acute distress.  Resting comfortably  at home.  Head is normocephalic, atraumatic.  No labored breathing.  Speech is clear and coherent with logical content.  Patient is alert and oriented at baseline.    Assessment and Plan: 1. Nausea  Increase fluids, follow up with pcp.   Follow Up Instructions: I discussed the assessment and treatment plan with the patient. The patient was provided an opportunity to ask questions and all were answered. The patient agreed with the plan and demonstrated an understanding of the instructions.  A copy of instructions were sent to the patient via MyChart unless otherwise noted below.     The patient was advised to call back or seek an in-person evaluation if the symptoms worsen or if the condition fails to improve as anticipated.  Time:  I spent 10 minutes with the patient via telehealth technology discussing the above problems/concerns.    Caroline Curio, FNP

## 2022-12-24 NOTE — Telephone Encounter (Signed)
Noted  

## 2022-12-24 NOTE — Patient Instructions (Signed)
Nausea, Adult Nausea is the feeling of having an upset stomach or that you are about to vomit. Nausea on its own is not usually a serious concern, but it may be an early sign of a more serious medical problem. As nausea gets worse, it can lead to vomiting. If vomiting develops, or if you are not able to drink enough fluids, you are at risk of becoming dehydrated. Dehydration can make you tired and thirsty, cause you to have a dry mouth, and decrease how often you urinate. Older adults and people with other diseases or a weak disease-fighting system (immune system) are at higher risk for dehydration. The main goals of treating your nausea are: To relieve your nausea. To limit repeated nausea episodes. To prevent vomiting and dehydration. Follow these instructions at home: Watch your symptoms for any changes. Tell your health care provider about them. Eating and drinking     Take an oral rehydration solution (ORS). This is a drink that is sold at pharmacies and retail stores. Drink clear fluids slowly and in small amounts as you are able. Clear fluids include water, ice chips, low-calorie sports drinks, and fruit juice that has water added (diluted fruit juice). Eat bland, easy-to-digest foods in small amounts as you are able. These foods include bananas, applesauce, rice, lean meats, toast, and crackers. Avoid drinking fluids that contain a lot of sugar or caffeine, such as energy drinks, sports drinks, and soda. Avoid alcohol. Avoid spicy or fatty foods. General instructions Take over-the-counter and prescription medicines only as told by your health care provider. Rest at home while you recover. Drink enough fluid to keep your urine pale yellow. Breathe slowly and deeply when you feel nauseous. Avoid smelling things that have strong odors. Wash your hands often using soap and water for at least 20 seconds. If soap and water are not available, use hand sanitizer. Make sure that everyone in  your household washes their hands well and often. Keep all follow-up visits. This is important. Contact a health care provider if: Your nausea gets worse. Your nausea does not go away after two days. You vomit multiple times. You cannot drink fluids without vomiting. You have any of the following: New symptoms. A fever. A headache. Muscle cramps. A rash. Pain while urinating. You feel light-headed or dizzy. Get help right away if: You have pain in your chest, neck, arm, or jaw. You feel extremely weak or you faint. You have vomit that is bright red or looks like coffee grounds. You have bloody or black stools (feces) or stools that look like tar. You have a severe headache, a stiff neck, or both. You have severe pain, cramping, or bloating in your abdomen. You have difficulty breathing or are breathing very quickly. Your heart is beating very quickly. Your skin feels cold and clammy. You feel confused. You have signs of dehydration, such as: Dark urine, very little urine, or no urine. Cracked lips. Dry mouth. Sunken eyes. Sleepiness. Weakness. These symptoms may be an emergency. Get help right away. Call 911. Do not wait to see if the symptoms will go away. Do not drive yourself to the hospital. Summary Nausea is the feeling that you have an upset stomach or that you are about to vomit. Nausea on its own is not usually a serious concern, but it may be an early sign of a more serious medical problem. If vomiting develops, or if you are not able to drink enough fluids, you are at risk of becoming   dehydrated. Follow recommendations for eating and drinking and take over-the-counter and prescription medicines only as told by your health care provider. Contact a health care provider right away if your symptoms worsen or you have new symptoms. Keep all follow-up visits. This is important. This information is not intended to replace advice given to you by your health care provider.  Make sure you discuss any questions you have with your health care provider. Document Revised: 12/05/2020 Document Reviewed: 12/05/2020 Elsevier Patient Education  2024 [employer]  

## 2022-12-24 NOTE — Telephone Encounter (Signed)
Chief Complaint: Nausea Symptoms: Mild-moderate Frequency: onset yesterday Pertinent Negatives: Patient denies vomiting Disposition: [] ED /[] Urgent Care (no appt availability in office) / [] Appointment(In office/virtual)/ [x]  [facility]/ [] Home Care/ [] Refused Recommended Disposition /[] [facility]/ []  Follow-up with PCP Additional Notes: Patient says her morning medications make her nauseated and she has been taking the promethazine, but has run out. She says she's not eating very much, no vomiting. Advised she will need OV. She says she has a lot of appointments next week preparing for upcoming surgery and she has an appointment on 01/11/23 to see [name]. Advised she will need to be seen sooner to evaluate the nausea, no availability in office with any provider. Offered virtual UC, she agreed, scheduled today at 1715.    Summary: medication request   Pt called saying she is having a lot of nausea from her medications and ask if she could get some promethazine [facility] on [address]  CB#  [phone]     Reason for Disposition . Nausea lasts > 1 week  Answer Assessment - Initial Assessment Questions 1. NAUSEA SEVERITY: "How bad is the nausea?" (e.g., mild, moderate, severe; dehydration, weight loss)   - MILD: loss of appetite without change in eating habits   - MODERATE: decreased oral intake without significant weight loss, dehydration, or malnutrition   - SEVERE: inadequate caloric or fluid intake, significant weight loss, symptoms of dehydration     Mild-Moderate 2. ONSET: "When did the nausea begin?"     Yesterday 3. VOMITING: "Any vomiting?" If Yes, ask: "How many times today?"     No 4. RECURRENT SYMPTOM: "Have you had nausea before?" If Yes, ask: "When was the last time?" "What happened that time?"     Yes, prescribed promethazine 5. CAUSE: "What do you think is causing the nausea?"     Morning medications  Protocols used:  Nausea-A-AH

## 2022-12-28 ENCOUNTER — Ambulatory Visit (HOSPITAL_BASED_OUTPATIENT_CLINIC_OR_DEPARTMENT_OTHER): Payer: Medicare HMO

## 2022-12-28 ENCOUNTER — Ambulatory Visit (HOSPITAL_COMMUNITY): Payer: Medicare HMO | Attending: Cardiovascular Disease

## 2022-12-28 DIAGNOSIS — R079 Chest pain, unspecified: Secondary | ICD-10-CM

## 2022-12-28 DIAGNOSIS — I340 Nonrheumatic mitral (valve) insufficiency: Secondary | ICD-10-CM | POA: Diagnosis not present

## 2022-12-28 DIAGNOSIS — Z01810 Encounter for preprocedural cardiovascular examination: Secondary | ICD-10-CM | POA: Insufficient documentation

## 2022-12-28 DIAGNOSIS — I517 Cardiomegaly: Secondary | ICD-10-CM

## 2022-12-28 LAB — MYOCARDIAL PERFUSION IMAGING
LV dias vol: 109 mL (ref 46–106)
LV sys vol: 62 mL
Nuc Stress EF: 44 %
Peak HR: 72 {beats}/min
Rest HR: 54 {beats}/min
Rest Nuclear Isotope Dose: 10.5 mCi
SDS: 6
SRS: 3
SSS: 9
ST Depression (mm): 0 mm
Stress Nuclear Isotope Dose: 32.1 mCi
TID: 1.05

## 2022-12-28 LAB — ECHOCARDIOGRAM COMPLETE
Area-P 1/2: 3.13 cm2
Height: 65 in
MV M vel: 3.75 m/s
MV Peak grad: 56.3 mmHg
Radius: 0.6 cm
S' Lateral: 3.3 cm
Weight: 3552 oz

## 2022-12-28 MED ORDER — REGADENOSON 0.4 MG/5ML IV SOLN
0.4000 mg | Freq: Once | INTRAVENOUS | Status: AC
Start: 2022-12-28 — End: 2022-12-28
  Administered 2022-12-28: 0.4 mg via INTRAVENOUS

## 2022-12-28 MED ORDER — TECHNETIUM TC 99M TETROFOSMIN IV KIT
10.5000 | PACK | Freq: Once | INTRAVENOUS | Status: AC | PRN
  Administered 2022-12-28: 10.5 via INTRAVENOUS

## 2022-12-28 MED ORDER — TECHNETIUM TC 99M TETROFOSMIN IV KIT
32.1000 | PACK | Freq: Once | INTRAVENOUS | Status: AC | PRN
  Administered 2022-12-28: 32.1 via INTRAVENOUS

## 2022-12-29 ENCOUNTER — Telehealth: Payer: Self-pay | Admitting: *Deleted

## 2022-12-29 DIAGNOSIS — I340 Nonrheumatic mitral (valve) insufficiency: Secondary | ICD-10-CM

## 2022-12-29 NOTE — Telephone Encounter (Signed)
-----   Message from Tereso Newcomer sent at 12/29/2022  1:19 PM EDT ----- Results sent to [name] via MyChart. See MyChart comments below. I will send a copy to Hoy Register, MD as Lorain Childes. PLAN:  -Repeat Echocardiogram 1 year (mitral regurgitation)  Ms. Abdulaziz  Your echocardiogram demonstrates low normal heart function (ejection fraction).  There is mild to moderate leakage of the mitral valve (mitral regurgitation).  No further testing or medication changes are needed at this time.  I will recheck an echocardiogram in 1 year to monitor the mitral regurgitation. Tereso Newcomer, PA-C

## 2022-12-30 ENCOUNTER — Ambulatory Visit (INDEPENDENT_AMBULATORY_CARE_PROVIDER_SITE_OTHER): Payer: Medicare HMO

## 2022-12-30 ENCOUNTER — Ambulatory Visit (HOSPITAL_COMMUNITY)
Admission: EM | Admit: 2022-12-30 | Discharge: 2022-12-30 | Disposition: A | Discharge status: Home / Self Care | Payer: Medicare HMO | Attending: Family Medicine | Admitting: Family Medicine

## 2022-12-30 ENCOUNTER — Encounter (HOSPITAL_COMMUNITY): Payer: Self-pay

## 2022-12-30 DIAGNOSIS — M25561 Pain in right knee: Secondary | ICD-10-CM | POA: Diagnosis not present

## 2022-12-30 DIAGNOSIS — Z96651 Presence of right artificial knee joint: Secondary | ICD-10-CM | POA: Diagnosis not present

## 2022-12-30 DIAGNOSIS — M549 Dorsalgia, unspecified: Secondary | ICD-10-CM

## 2022-12-30 DIAGNOSIS — R001 Bradycardia, unspecified: Secondary | ICD-10-CM

## 2022-12-30 DIAGNOSIS — G8929 Other chronic pain: Secondary | ICD-10-CM

## 2022-12-30 DIAGNOSIS — Z471 Aftercare following joint replacement surgery: Secondary | ICD-10-CM | POA: Diagnosis not present

## 2022-12-30 DIAGNOSIS — W19XXXA Unspecified fall, initial encounter: Secondary | ICD-10-CM | POA: Diagnosis not present

## 2022-12-30 MED ORDER — DEXAMETHASONE SODIUM PHOSPHATE 10 MG/ML IJ SOLN
10.0000 mg | Freq: Once | INTRAMUSCULAR | Status: AC
  Administered 2022-12-30: 10 mg via INTRAMUSCULAR

## 2022-12-30 MED ORDER — KETOROLAC TROMETHAMINE 30 MG/ML IJ SOLN
15.0000 mg | Freq: Once | INTRAMUSCULAR | Status: AC
  Administered 2022-12-30: 15 mg via INTRAMUSCULAR

## 2022-12-30 MED ORDER — DEXAMETHASONE SODIUM PHOSPHATE 10 MG/ML IJ SOLN
INTRAMUSCULAR | Status: AC
  Filled 2022-12-30: qty 1

## 2022-12-30 MED ORDER — KETOROLAC TROMETHAMINE 30 MG/ML IJ SOLN
INTRAMUSCULAR | Status: AC
  Filled 2022-12-30: qty 1

## 2022-12-30 MED ORDER — PREDNISONE 20 MG PO TABS: 40.0000 mg | ORAL_TABLET | Freq: Every day | ORAL | 0 refills | Status: DC

## 2022-12-30 NOTE — ED Provider Notes (Signed)
[facility]    CSN: [id number] Arrival date & time: 12/30/22  1141      History   Chief Complaint Chief Complaint  Patient presents with   Fall   Knee Pain   Back Pain    HPI Caroline Williams is a 62 y.o. female.   HPI Patient since today for evaluation of a right knee injury after she sustained a fall 2 days ago.  She reports that she tripped and she did not lose consciousness nor did she hit her head.  Patient has a long history of chronic back pain and is scheduled for back surgery on 01/12/2023.  On review of vital signs patient's heart rate was 39.  She tells this Clinical research associate that she was placed on Toprol 25 mg ER a little over 1 week ago not consistently been checking her pulse rate prior to taking medication.  Reviewed note from cardiology placed on metoprolol for rate control she had complained of palpitations and chest pain.  She is currently asymptomatic however reports that she has had some intermittent dizziness since starting the metoprolol does not relate the dizziness to her fall.  Past Medical History:  Diagnosis Date   Anxiety    Arthritis    Asthma    Bipolar 1 disorder (HCC)    Breast discharge    Breast lump    Breast pain    CAD (coronary artery disease) 12/03/2022    o Cath 2008: no CAD   o CCTA 10/05/19: CAC score 49, 88th percentile, nonobstructive CAD (LM 1-24 mid and dist)  o Myoview 12/31/20: EF 45, no ischemia or infarction, low risk    Chronic pain    on MS Contin and oxycodone   Depression    Diabetes mellitus    diet and exercise controlled   Escherichia coli (E. coli) infection    Fever    GERD (gastroesophageal reflux disease)    History of chest pain    History of kidney stones    History of knee replacement, total    Hypertension    Hypothyroidism    N&V (nausea and vomiting)    Peripheral neuropathy    Right foot no sensation   Pneumonia    2015,2014   Sciatica    Sleep apnea    does not use CPAP   Thyroid disease     Wears glasses     Patient Active Problem List   Diagnosis Date Noted   PVC's (premature ventricular contractions) 12/03/2022   CAD (coronary artery disease) 12/03/2022   Preoperative cardiovascular examination 12/02/2022   Bipolar 1 disorder (HCC)    Degeneration of lumbar intervertebral disc 08/07/2020   Essential (primary) hypertension 05/07/2020   Other chronic pain 02/14/2020   Elevated blood-pressure reading, without diagnosis of hypertension 02/13/2020   Hyperlipidemia 09/17/2019   COPD mixed type (HCC) 04/17/2019   Tobacco user 04/17/2019   Nocturnal hypoxemia 12/25/2018   Muscle weakness 12/05/2018   Change in bowel habits 03/14/2018   Exertional chest pain 01/26/2016   Hallucination, drug-induced (HCC) 03/12/2015   Hypothyroidism 03/12/2015   Chronic low back pain 01/29/2015   Lumbar spondylosis 01/29/2015   Lap gastric bypass June 2016 11/25/2014   Diabetes mellitus with neuropathy (HCC) 06/20/2013   Chronic pain of right knee 06/20/2013   Morbid obesity (HCC) 07/05/2012    Past Surgical History:  Procedure Laterality Date   ABDOMINAL HYSTERECTOMY     partial   APPENDECTOMY     BIOPSY  03/14/2018  Procedure: BIOPSY;  Surgeon: Charlott Rakes, MD;  Location: [facility];  Service: Endoscopy;;   BREAST DUCTAL SYSTEM EXCISION Right 08/31/2017   Procedure: RIGHT BREAST CENTRAL DUCT EXCISION;  Surgeon: Harriette Bouillon, MD;  Location: [facility];  Service: General;  Laterality: Right;   BREAST EXCISIONAL BIOPSY Right    BREAST LUMPECTOMY     right   CARDIAC CATHETERIZATION     no significant CAD, nl LV function by 11/08/06 cath   CESAREAN SECTION     x4   COLONOSCOPY WITH PROPOFOL N/A 03/14/2018   Procedure: COLONOSCOPY WITH PROPOFOL;  Surgeon: Charlott Rakes, MD;  Location: [facility];  Service: Endoscopy;  Laterality: N/A;   GASTRIC ROUX-EN-Y N/A 11/25/2014   Procedure: LAPAROSCOPIC ROUX-EN-Y GASTRIC BYPASS WITH UPPER ENDOSCOPY;  Surgeon:  Luretha Murphy, MD;  Location: [facility];  Service: General;  Laterality: N/A;   HERNIA REPAIR     JOINT REPLACEMENT     KNEE SURGERY     right   MOUTH SURGERY     MYOMECTOMY ABDOMINAL APPROACH  1989   POLYPECTOMY  03/14/2018   Procedure: POLYPECTOMY;  Surgeon: Charlott Rakes, MD;  Location: [facility];  Service: Endoscopy;;   TOTAL KNEE REVISION  06/21/2012   Procedure: TOTAL KNEE REVISION;  Surgeon: Nadara Mustard, MD;  Location: [facility];  Service: Orthopedics;  Laterality: Right;  Revision Right Total Knee Arthroplasty    OB History   No obstetric history on file.      Home Medications    Prior to Admission medications   Medication Sig Start Date End Date Taking? Authorizing Provider  predniSONE (DELTASONE) 20 MG tablet Take 2 tablets (40 mg total) by mouth daily with breakfast. 12/30/22  Yes Bing Neighbors, NP  acetaminophen (TYLENOL) 500 MG tablet Take 1,000 mg by mouth every 6 (six) hours as needed for moderate pain or headache.    [provider]  albuterol (VENTOLIN HFA) 108 (90 Base) MCG/ACT inhaler INHALE 2 PUFFS INTO THE LUNGS EVERY 6 HOURS AS NEEDED FOR WHEEZING OR SHORTNESS OF BREATH 09/23/21   Hoy Register, MD  Alirocumab (PRALUENT) 150 MG/ML SOAJ INJECT 1 PEN INTO SKIN EVERY 14 DAYS. 12/03/22   Tereso Newcomer T, PA-C  ALPRAZolam Prudy Feeler) 1 MG tablet Take 1 mg by mouth 3 (three) times daily as needed for anxiety. 07/02/14   [provider]  amphetamine-dextroamphetamine (ADDERALL XR) 30 MG 24 hr capsule Take 30 mg by mouth daily.  03/10/18   [provider]  amphetamine-dextroamphetamine (ADDERALL) 20 MG tablet Take 20 mg by mouth every evening. 10/13/17   [provider]  Blood Glucose Monitoring Suppl (ACCU-CHEK GUIDE) w/Device KIT 1 kit by Does not apply route 3 (three) times daily. To check blood sugars 04/10/21   Hoy Register, MD  cetirizine (ZYRTEC) 10 MG tablet Take 1 tablet (10 mg total) by mouth daily. 04/07/21   Hoy Register,  MD  Cyanocobalamin (VITAMIN B 12 PO) Take 1 tablet by mouth daily. Unsure of dose    [provider]  dapagliflozin propanediol (FARXIGA) 5 MG TABS tablet Take 1 tablet (5 mg total) by mouth daily before breakfast. 10/07/22   Hoy Register, MD  diclofenac Sodium (VOLTAREN) 1 % GEL Apply 2-4 g topically 2 (two) times daily as needed (wrist/knee pain). 11/23/21   Hoy Register, MD  doxepin (SINEQUAN) 75 MG capsule Take 75 mg by mouth at bedtime. 03/02/18   [provider]  fluticasone (FLONASE) 50 MCG/ACT nasal spray SHAKE LIQUID AND USE  TWO SPRAYS IN EACH NOSTRIL DAILY 12/21/22   Hoy Register, MD  glucose blood (ACCU-CHEK GUIDE) test strip Use as instructed 04/10/21   Hoy Register, MD  hydrocortisone-pramoxine (ANALPRAM HC) 2.5-1 % rectal cream Place 1 application rectally 3 (three) times daily. 07/13/21   Hoy Register, MD  isosorbide mononitrate (IMDUR) 30 MG 24 hr tablet Take 1 tablet (30 mg total) by mouth daily. 12/11/20   Lyn Records, MD  Lancets (ACCU-CHEK MULTICLIX) lancets Use as instructed 04/10/21   Hoy Register, MD  levothyroxine (SYNTHROID) 150 MCG tablet TAKE 1 TABLET(150 MCG) BY MOUTH DAILY BEFORE BREAKFAST 05/10/22   Hoy Register, MD  linaclotide (LINZESS) 290 MCG CAPS capsule Take 290 mcg by mouth See admin instructions. Every other night    [provider]  methocarbamol (ROBAXIN) 500 MG tablet Take 1 tablet (500 mg total) by mouth every 8 (eight) hours as needed for muscle spasms. 09/10/21   Petrucelli, Samantha R, PA-C  metoprolol succinate (TOPROL XL) 25 MG 24 hr tablet Take 1 tablet (25 mg total) by mouth daily. 12/15/22   Tereso Newcomer T, PA-C  morphine (MS CONTIN) 15 MG 12 hr tablet Take 1 tablet (15 mg total) by mouth every 12 (twelve) hours. 11/14/14   Jones Bales, NP  nabumetone (RELAFEN) 750 MG tablet Take 750 mg by mouth 2 (two) times daily. 08/08/19   [provider]  naloxone [facility]) nasal spray 4 mg/0.1 mL Place 1 spray  into the nose as needed (overdose). 05/22/20   [provider]  nitroGLYCERIN (NITROSTAT) 0.4 MG SL tablet DISSOLVE 1 TABLET UNDER THE TONGUE EVERY 5 MINUTES AS NEEDED 12/03/22   Tereso Newcomer T, PA-C  Olmesartan-amLODIPine-HCTZ 40-5-25 MG TABS Take 1 tablet by mouth daily. 12/08/22   Hoy Register, MD  omeprazole (PRILOSEC) 40 MG capsule TAKE 1 CAPSULE(40 MG) BY MOUTH DAILY 01/22/20   Fulp, Cammie, MD  Oxycodone HCl 10 MG TABS Take 1 tablet (10 mg total) by mouth every 6 (six) hours as needed (for pain). 11/14/14   Jones Bales, NP  promethazine (PHENERGAN) 25 MG tablet Take 1 tablet (25 mg total) by mouth every 8 (eight) hours as needed for nausea or vomiting. 12/24/22   Delorse Lek, FNP  REXULTI 2 MG TABS tablet Take 2 mg by mouth daily.    [provider]  solifenacin (VESICARE) 5 MG tablet Take 1 tablet (5 mg total) by mouth daily. 10/07/22   Hoy Register, MD  spironolactone (ALDACTONE) 25 MG tablet Take 0.5 tablets (12.5 mg total) by mouth daily. 12/08/22   Hoy Register, MD  umeclidinium-vilanterol (ANORO ELLIPTA) 62.5-25 MCG/INH AEPB Inhale 1 puff into the lungs daily. 04/17/19   Jetty Duhamel D, MD  vortioxetine HBr (TRINTELLIX) 20 MG TABS tablet Take 20 mg by mouth daily.    [provider]  escitalopram (LEXAPRO) 20 MG tablet Take 20 mg by mouth daily.  12/14/18  [provider]  LATUDA 120 MG TABS Take 120 mg by mouth at bedtime.  03/04/18 12/14/18  [provider]  LISINOPRIL PO Take by mouth daily.    09/06/11  [provider]  metoCLOPramide (REGLAN) 10 MG tablet Take 1 tablet (10 mg total) by mouth every 8 (eight) hours as needed for nausea. Patient not taking: Reported on 12/06/2019 06/30/18 05/12/20  Mesner, Barbara Cower, MD    Family History Family History  Problem Relation Age of Onset   Cancer Father        colon   Stroke Father  Heart disease Father    Diabetes Father    Hypertension Father    Depression Sister     Hypertension Mother    Breast cancer Neg Hx     Social History Social History   Tobacco Use   Smoking status: Former    Current packs/day: 0.00    Average packs/day: 0.3 packs/day for 43.0 years (10.8 ttl pk-yrs)    Types: Cigarettes    Start date: 20    Quit date: 2020    Years since quitting: 4.5   Smokeless tobacco: Never  Vaping Use   Vaping status: Never Used  Substance Use Topics   Alcohol use: Yes    Comment: social   Drug use: No     Allergies   Aspirin, Bee venom, Sulfa antibiotics, Evolocumab, and Other   Review of Systems Review of Systems Pertinent negatives listed in HPI   Physical Exam Triage Vital Signs ED Triage Vitals  Encounter Vitals Group     BP 12/30/22 1223 126/74     Systolic BP Percentile --      Diastolic BP Percentile --      Pulse Rate 12/30/22 1223 (!) 39     Resp 12/30/22 1223 14     Temp 12/30/22 1223 98.2 F (36.8 C)     Temp Source 12/30/22 1223 Oral     SpO2 12/30/22 1223 95 %     Weight --      Height --      Head Circumference --      Peak Flow --      Pain Score 12/30/22 1226 10     Pain Loc --      Pain Education --      Exclude from Growth Chart --    No data found.  Updated Vital Signs BP 126/74 (BP Location: Left Arm)   Pulse 71   Temp 98.2 F (36.8 C) (Oral)   Resp 14   SpO2 95%   Visual Acuity Right Eye Distance:   Left Eye Distance:   Bilateral Distance:    Right Eye Near:   Left Eye Near:    Bilateral Near:     Physical Exam Vitals reviewed.  Constitutional:      Appearance: She is obese.  HENT:     Head: Normocephalic and atraumatic.     Nose: Nose normal.  Eyes:     Extraocular Movements: Extraocular movements intact.     Pupils: Pupils are equal, round, and reactive to light.  Cardiovascular:     Rate and Rhythm: Normal rate and regular rhythm.  Pulmonary:     Effort: Pulmonary effort is normal.     Breath sounds: Normal breath sounds.  Musculoskeletal:     Cervical back: Normal  range of motion and neck supple.     Right knee: Swelling and bony tenderness present. Tenderness present over the medial joint line.     Left knee: Normal.  Skin:    General: Skin is warm.  Neurological:     Mental Status: She is alert and oriented to person, place, and time. Mental status is at baseline.     Comments: In wheelchair ambulates at baseline with a cane      UC Treatments / Results  Labs (all labs ordered are listed, but only abnormal results are displayed) Labs Reviewed - No data to display  EKG SR with PVC, 71 BPM ECG located under media tracing did not transmit electronically to chart  Radiology DG Knee 2 Views Right  Result Date: 12/30/2022 CLINICAL DATA:  Right knee pain after fall 2 days ago. EXAM: RIGHT KNEE - 1-2 VIEW COMPARISON:  Oct 26, 2018. FINDINGS: Status post right total knee arthroplasty. No definite effusion is noted. No definite fracture or dislocation is noted. IMPRESSION: No acute abnormality seen. Electronically Signed   By: Lupita Raider M.D.   On: 12/30/2022 13:24    Procedures Procedures (including critical care time)  Medications Ordered in UC Medications  ketorolac (TORADOL) 30 MG/ML injection 15 mg (15 mg Intramuscular Given 12/30/22 1355)  dexamethasone (DECADRON) injection 10 mg (10 mg Intramuscular Given 12/30/22 1353)    Initial Impression / Assessment and Plan / UC Course  I have reviewed the triage vital signs and the nursing notes.  Pertinent labs & imaging results that were available during my care of the patient were reviewed by me and considered in my medical decision making (see chart for details).    Fall, acute right knee injury, plain film imaging is negative for any acute fracture or misalignment of knee replacement hardware.  On admission patient had heart rate in the upper 30s, EKG performed heart rate is 71 pulse, however given that patient recently started metoprolol encouraged patient to call her cardiologist to  see if dose needs to be changed.  Patient is asymptomatic at present.  Patient also complains of worsening of her chronic back pain she is scheduled for surgery in 2 weeks however agreed to give Toradol and Decadron IM to reduce inflammation and put her on a burst of prednisone for 5 days.  Patient is already followed by pain management and has chronic pain meds.  Strict ED precautions given if she develops any worsening neurological symptoms. Final Clinical Impressions(s) / UC Diagnoses   Final diagnoses:  Fall, initial encounter  Acute pain of right knee  Chronic back pain, unspecified back location, unspecified back pain laterality  Slow heart rate     Discharge Instructions      -Contact cardiologist to discuss metoprolol lower heart rate today initially low 40's and 30's. -Your x-ray shows no knee fracture or misalignment . You likely strained the knee during the fall continue wearing knee brace for comfort. -The steroids will help improve inflammation and pain in your back and knee.      ED Prescriptions     Medication Sig Dispense Auth. Provider   predniSONE (DELTASONE) 20 MG tablet Take 2 tablets (40 mg total) by mouth daily with breakfast. 10 tablet Bing Neighbors, NP      PDMP not reviewed this encounter.   Bing Neighbors, NP 12/30/22 1425

## 2022-12-30 NOTE — ED Triage Notes (Signed)
Patient states she is due for back surgery soon. Patient states her legs gave out on her 2 days ago and she fell on her right knee. Patient states she has swelling to the right knee and pain of the right and left lower back that radiates down both legs.  Patient states she has been taking Morphine po and Oxycodone po. Patient states she took both at 0600 today with no relief.

## 2022-12-30 NOTE — Discharge Instructions (Addendum)
-  Contact cardiologist to discuss metoprolol lower heart rate today initially low 40's and 30's. -Your x-ray shows no knee fracture or misalignment . You likely strained the knee during the fall continue wearing knee brace for comfort. -The steroids will help improve inflammation and pain in your back and knee.

## 2023-01-03 ENCOUNTER — Telehealth: Payer: Self-pay | Admitting: *Deleted

## 2023-01-03 MED ORDER — METOPROLOL SUCCINATE ER 25 MG PO TB24: 12.5000 mg | ORAL_TABLET | Freq: Every day | ORAL | 3 refills | Status: DC

## 2023-01-03 NOTE — Progress Notes (Signed)
Pt has been made aware of normal result and verbalized understanding.  jw

## 2023-01-03 NOTE — Telephone Encounter (Signed)
-----   Message from Lyman sent at 01/03/2023  8:52 AM EDT ----- Notes indicate she has had some dizziness since starting on Metoprolol succinate. After recovery from her surgery, she should try to take Metoprolol succinate 12.5 mg once daily (at bedtime). If she has dizziness with this dose, stop and let us know. If she tolerates ok, will see what follow up monitor shows.  Tereso Newcomer, PA-C    01/03/2023 8:46 AM

## 2023-01-10 NOTE — Pre-Procedure Instructions (Addendum)
Surgical Instructions   Your procedure is scheduled on January 12, 2023. Report to [facility] Main Entrance "A" at 5:30 A.M., then check in with the Admitting office. Any questions or running late day of surgery: call [phone]  Questions prior to your surgery date: call [phone], Monday-Friday, 8am-4pm. If you experience any cold or flu symptoms such as cough, fever, chills, shortness of breath, etc. between now and your scheduled surgery, please notify us at the above number.     Remember:  Do not eat or drink after midnight the night before your surgery    Take these medicines the morning of surgery with A SIP OF WATER: cetirizine (ZYRTEC)  fluticasone (FLONASE) nasal spray  isosorbide mononitrate (IMDUR)  levothyroxine (SYNTHROID)  morphine (MS CONTIN)  omeprazole (PRILOSEC)  predniSONE (DELTASONE)  REXULTI  solifenacin (VESICARE)  umeclidinium-vilanterol (ANORO ELLIPTA) inhaler vortioxetine HBr (TRINTELLIX)    May take these medicines IF NEEDED: acetaminophen (TYLENOL)  albuterol (VENTOLIN HFA) inhaler  ALPRAZolam (XANAX)  methocarbamol (ROBAXIN)  naloxone (NARCAN) nasal spray  nitroGLYCERIN (NITROSTAT) - please call [phone] if dose taken prior to surgery Oxycodone  promethazine (PHENERGAN)    One week prior to surgery, STOP taking any Aspirin (unless otherwise instructed by your surgeon) Aleve, Naproxen, Ibuprofen, Motrin, Advil, Goody's, BC's, all herbal medications, fish oil, and non-prescription vitamins. This includes your medication: diclofenac Sodium (VOLTAREN) GEL and nabumetone (RELAFEN)    WHAT DO I DO ABOUT MY DIABETES MEDICATION?   STOP taking your dapagliflozin propanediol (FARXIGA) three days prior to surgery. Your last dose will be July 27th.      HOW TO MANAGE YOUR DIABETES BEFORE AND AFTER SURGERY  Why is it important to control my blood sugar before and after surgery? Improving blood sugar levels before and after surgery helps  healing and can limit problems. A way of improving blood sugar control is eating a healthy diet by:  Eating less sugar and carbohydrates  Increasing activity/exercise  Talking with your doctor about reaching your blood sugar goals High blood sugars (greater than 180 mg/dL) can raise your risk of infections and slow your recovery, so you will need to focus on controlling your diabetes during the weeks before surgery. Make sure that the doctor who takes care of your diabetes knows about your planned surgery including the date and location.  How do I manage my blood sugar before surgery? Check your blood sugar at least 4 times a day, starting 2 days before surgery, to make sure that the level is not too high or low.  Check your blood sugar the morning of your surgery when you wake up and every 2 hours until you get to the Short Stay unit.  If your blood sugar is less than 70 mg/dL, you will need to treat for low blood sugar: Do not take insulin. Treat a low blood sugar (less than 70 mg/dL) with  cup of clear juice (cranberry or apple), 4 glucose tablets, OR glucose gel. Recheck blood sugar in 15 minutes after treatment (to make sure it is greater than 70 mg/dL). If your blood sugar is not greater than 70 mg/dL on recheck, call [phone] for further instructions. Report your blood sugar to the short stay nurse when you get to Short Stay.  If you are admitted to the hospital after surgery: Your blood sugar will be checked by the staff and you will probably be given insulin after surgery (instead of oral diabetes medicines) to make sure you have good blood sugar levels. The  goal for blood sugar control after surgery is 80-180 mg/dL.                      Do NOT Smoke (Tobacco/Vaping) for 24 hours prior to your procedure.  If you use a CPAP at night, you may bring your mask/headgear for your overnight stay.   You will be asked to remove any contacts, glasses, piercing's, hearing aid's,  dentures/partials prior to surgery. Please bring cases for these items if needed.    Patients discharged the day of surgery will not be allowed to drive home, and someone needs to stay with them for 24 hours.  SURGICAL WAITING ROOM VISITATION Patients may have no more than 2 support people in the waiting area - these visitors may rotate.   Pre-op nurse will coordinate an appropriate time for 1 ADULT support person, who may not rotate, to accompany patient in pre-op.  Children under the age of 66 must have an adult with them who is not the patient and must remain in the main waiting area with an adult.  If the patient needs to stay at the hospital during part of their recovery, the visitor guidelines for inpatient rooms apply.  Please refer to the [facility] website for the visitor guidelines for any additional information.   If you received a COVID test during your pre-op visit  it is requested that you wear a mask when out in public, stay away from anyone that may not be feeling well and notify your surgeon if you develop symptoms. If you have been in contact with anyone that has tested positive in the last 10 days please notify you surgeon.      Pre-operative CHG Bathing Instructions   You can play a key role in reducing the risk of infection after surgery. Your skin needs to be as free of germs as possible. You can reduce the number of germs on your skin by washing with CHG (chlorhexidine gluconate) soap before surgery. CHG is an antiseptic soap that kills germs and continues to kill germs even after washing.   DO NOT use if you have an allergy to chlorhexidine/CHG or antibacterial soaps. If your skin becomes reddened or irritated, stop using the CHG and notify one of our RNs at [phone].              TAKE A SHOWER THE NIGHT BEFORE SURGERY AND THE DAY OF SURGERY    Please keep in mind the following:  DO NOT shave, including legs and underarms, 48 hours prior to surgery.   You may  shave your face before/day of surgery.  Place clean sheets on your bed the night before surgery Use a clean washcloth (not used since being washed) for each shower. DO NOT sleep with pet's night before surgery.  CHG Shower Instructions:  If you choose to wash your hair and private area, wash first with your normal shampoo/soap.  After you use shampoo/soap, rinse your hair and body thoroughly to remove shampoo/soap residue.  Turn the water OFF and apply half the bottle of CHG soap to a CLEAN washcloth.  Apply CHG soap ONLY FROM YOUR NECK DOWN TO YOUR TOES (washing for 3-5 minutes)  DO NOT use CHG soap on face, private areas, open wounds, or sores.  Pay special attention to the area where your surgery is being performed.  If you are having back surgery, having someone wash your back for you may be helpful. Wait 2 minutes after  CHG soap is applied, then you may rinse off the CHG soap.  Pat dry with a clean towel  Put on clean pajamas    Additional instructions for the day of surgery: DO NOT APPLY any lotions, deodorants, cologne, or perfumes.   Do not wear jewelry or makeup Do not wear nail polish, gel polish, artificial nails, or any other type of covering on natural nails (fingers and toes) Do not bring valuables to the hospital. [facility] is not responsible for valuables/personal belongings. Put on clean/comfortable clothes.  Please brush your teeth.  Ask your nurse before applying any prescription medications to the skin.

## 2023-01-11 ENCOUNTER — Encounter (HOSPITAL_COMMUNITY): Payer: Self-pay

## 2023-01-11 ENCOUNTER — Other Ambulatory Visit: Payer: Self-pay

## 2023-01-11 ENCOUNTER — Ambulatory Visit: Payer: Medicare HMO | Admitting: Family Medicine

## 2023-01-11 ENCOUNTER — Encounter (HOSPITAL_COMMUNITY)
Admission: RE | Admit: 2023-01-11 | Discharge: 2023-01-11 | Disposition: A | Discharge status: Home / Self Care | Payer: Medicare HMO | Source: Ambulatory Visit | Attending: Neurosurgery | Admitting: Neurosurgery

## 2023-01-11 VITALS — BP 127/92 | HR 68 | Temp 98.4°F | Resp 17 | Ht 65.0 in | Wt 236.2 lb

## 2023-01-11 DIAGNOSIS — I10 Essential (primary) hypertension: Secondary | ICD-10-CM | POA: Diagnosis not present

## 2023-01-11 DIAGNOSIS — Z87891 Personal history of nicotine dependence: Secondary | ICD-10-CM | POA: Insufficient documentation

## 2023-01-11 DIAGNOSIS — I493 Ventricular premature depolarization: Secondary | ICD-10-CM | POA: Insufficient documentation

## 2023-01-11 DIAGNOSIS — J449 Chronic obstructive pulmonary disease, unspecified: Secondary | ICD-10-CM | POA: Insufficient documentation

## 2023-01-11 DIAGNOSIS — E785 Hyperlipidemia, unspecified: Secondary | ICD-10-CM | POA: Diagnosis not present

## 2023-01-11 DIAGNOSIS — Z01812 Encounter for preprocedural laboratory examination: Secondary | ICD-10-CM | POA: Diagnosis present

## 2023-01-11 DIAGNOSIS — E119 Type 2 diabetes mellitus without complications: Secondary | ICD-10-CM

## 2023-01-11 DIAGNOSIS — R0789 Other chest pain: Secondary | ICD-10-CM | POA: Diagnosis not present

## 2023-01-11 DIAGNOSIS — Z01818 Encounter for other preprocedural examination: Secondary | ICD-10-CM

## 2023-01-11 HISTORY — DX: Chronic obstructive pulmonary disease, unspecified: J44.9

## 2023-01-11 HISTORY — DX: Cardiac arrhythmia, unspecified: I49.9

## 2023-01-11 LAB — CBC
HCT: 34.2 % — ABNORMAL LOW (ref 36.0–46.0)
Hemoglobin: 11 g/dL — ABNORMAL LOW (ref 12.0–15.0)
MCH: 28.6 pg (ref 26.0–34.0)
MCHC: 32.2 g/dL (ref 30.0–36.0)
MCV: 89.1 fL (ref 80.0–100.0)
Platelets: 225 10*3/uL (ref 150–400)
RBC: 3.84 MIL/uL — ABNORMAL LOW (ref 3.87–5.11)
RDW: 18.9 % — ABNORMAL HIGH (ref 11.5–15.5)
WBC: 4.3 10*3/uL (ref 4.0–10.5)
nRBC: 0 % (ref 0.0–0.2)

## 2023-01-11 LAB — BASIC METABOLIC PANEL
Anion gap: 9 (ref 5–15)
BUN: 14 mg/dL (ref 8–23)
CO2: 25 mmol/L (ref 22–32)
Calcium: 8.8 mg/dL — ABNORMAL LOW (ref 8.9–10.3)
Chloride: 102 mmol/L (ref 98–111)
Creatinine, Ser: 1.39 mg/dL — ABNORMAL HIGH (ref 0.44–1.00)
GFR, Estimated: 43 mL/min — ABNORMAL LOW (ref >60)
Glucose, Bld: 84 mg/dL (ref 70–99)
Potassium: 3.9 mmol/L (ref 3.5–5.1)
Sodium: 136 mmol/L (ref 135–145)

## 2023-01-11 LAB — SURGICAL PCR SCREEN
MRSA, PCR: NEGATIVE
Staphylococcus aureus: NEGATIVE

## 2023-01-11 LAB — GLUCOSE, CAPILLARY: Glucose-Capillary: 83 mg/dL (ref 70–99)

## 2023-01-11 LAB — NO BLOOD PRODUCTS

## 2023-01-11 NOTE — Progress Notes (Signed)
Anesthesia Chart Review:  Follows with cardiology for hx of HTN, HLD, PVCs, atypical chest pain. She was seen by Tereso Newcomer, PA-C 12/03/22 for preop eval. Per note, "Recent echocardiogram demonstrated low normal LV function with EF 50-55%.  There was mild to moderate mitral regurgitation.  Nuclear stress test demonstrated no ischemia. The patient may proceed with her lumbar spine surgery at acceptable risk."  Former smoker with associated COPD, maintained on Anoro Ellipta and PRN albuterol.   OSA, does not use CPAP.  NIDDM2, A1c still in process as of 4pm 01/11/23.  Pt refuses blood products.   Pt states she was not previously advised to hold NSAIDs. Last dose of relafen 01/10/23. Dr. Lovell Sheehan aware.  Preop labs reviewed, creatinine mildly elevated 1.39, mild anemia Hgb 11.0.  EKG 12/30/22: Sinus rhythm with frequent Premature ventricular complexes in a pattern of bigeminy. Rate 76. Poor R wave progression. No significant change.   TTE 12/28/22:  1. Left ventricular ejection fraction, by estimation, is 50 to 55%. Left  ventricular ejection fraction by 3D volume is 51 %. The left ventricle has  low normal function. The left ventricle demonstrates global hypokinesis.  There is moderate concentric  left ventricular hypertrophy. Left ventricular diastolic parameters are  indeterminate. The average left ventricular global longitudinal strain is  -16.1 %. The global longitudinal strain is normal.   2. Right ventricular systolic function is normal. The right ventricular  size is normal. There is normal pulmonary artery systolic pressure. The  estimated right ventricular systolic pressure is 18.4 mmHg.   3. The mitral valve is normal in structure. Mild to moderate mitral valve  regurgitation. No evidence of mitral stenosis.   4. The aortic valve is tricuspid. Aortic valve regurgitation is not  visualized. Aortic valve sclerosis is present, with no evidence of aortic  valve stenosis.   5. The  inferior vena cava is normal in size with greater than 50%  respiratory variability, suggesting right atrial pressure of 3 mmHg.   Nuclear stress 12/28/22:   Findings are consistent with no ischemia. The study is low risk.   No ST deviation was noted.   LV perfusion is normal.   Left ventricular function is abnormal. Global function is mildly reduced. Nuclear stress EF: 44%. The left ventricular ejection fraction is moderately decreased (30-44%). End diastolic cavity size is normal.   Prior study available for comparison. No changes compared to prior study.   Low risk stress nuclear study with normal perfusion and mildly reduced left ventricular systolic function. Consider nonischemic cardiomyopathy.   [name] [facility] Short Stay Center/Anesthesiology Phone [phone] 01/11/2023 4:11 PM

## 2023-01-11 NOTE — Progress Notes (Signed)
Caroline Williams, with Dr. Lovell Sheehan office, notified that T/S not collected due to blood refusal. [name] also notified of pt taking Relafen and Volteran Gel last night and her Farxiga yesterday morning. [name] will notify Dr. Lovell Sheehan.

## 2023-01-11 NOTE — Progress Notes (Signed)
PCP - Dr. Hoy Register Cardiologist - Dr. Alverda Skeans  PPM/ICD - Denies Device Orders - n/a Rep Notified - n/a  Chest x-ray - Denies EKG - 12/03/2022 Stress Test - 12/28/2022 ECHO - 12/28/2022 Cardiac Cath - 2008 - no stents placed CT Coronary - 10/05/2019  Sleep Study - +OSA a few years ago but pt does not wear CPAP  PT is DM2. She checks her blood sugar 3x/day. Normal fasting range around 120s. CBG at pre-op 83. Pt has not had anything to eat or drink yet today. A1c result pending.  Last dose of GLP1 agonist- n/a   GLP1 instructions: n/a  Blood Thinner Instructions: n/a Aspirin Instructions: n/a  NPO after midnight  COVID TEST- n/a   Anesthesia review: Yes. Cardiac Clearance.    Patient denies shortness of breath, fever, cough and chest pain at PAT appointment. Pt denies any respiratory illness/infection in the last two months.   All instructions explained to the patient, with a verbal understanding of the material. Patient agrees to go over the instructions while at home for a better understanding. Patient also instructed to self quarantine after being tested for COVID-19. The opportunity to ask questions was provided.

## 2023-01-11 NOTE — Anesthesia Preprocedure Evaluation (Addendum)
Anesthesia Evaluation  Patient identified by MRN, date of birth, ID band Patient awake    Reviewed: Allergy & Precautions, NPO status , Patient's Chart, lab work & pertinent test results  History of Anesthesia Complications Negative for: history of anesthetic complications  Airway Mallampati: III  TM Distance: >3 FB Neck ROM: Full    Dental  (+) Poor Dentition, Loose, Dental Advisory Given   Pulmonary sleep apnea , COPD, former smoker   Pulmonary exam normal        Cardiovascular hypertension, Pt. on home beta blockers Normal cardiovascular exam  "Recent echocardiogram demonstrated low normal LV function with EF 50-55%.  There was mild to moderate mitral regurgitation.  Nuclear stress test demonstrated no ischemia. The patient may proceed with her lumbar spine surgery at acceptable risk."   Neuro/Psych  PSYCHIATRIC DISORDERS Anxiety Depression Bipolar Disorder   negative neurological ROS     GI/Hepatic negative GI ROS, Neg liver ROS,,,  Endo/Other  diabetesHypothyroidism  Morbid obesity  Renal/GU negative Renal ROS     Musculoskeletal negative musculoskeletal ROS (+)    Abdominal   Peds  Hematology negative hematology ROS (+)   Anesthesia Other Findings   Reproductive/Obstetrics                             Anesthesia Physical Anesthesia Plan  ASA: 3  Anesthesia Plan: General   Post-op Pain Management: Tylenol PO (pre-op)* and Ketamine IV*   Induction:   PONV Risk Score and Plan: 4 or greater and Ondansetron, Dexamethasone, Midazolam and Scopolamine patch - Pre-op  Airway Management Planned: Oral ETT  Additional Equipment:   Intra-op Plan:   Post-operative Plan: Extubation in OR  Informed Consent: I have reviewed the patients History and Physical, chart, labs and discussed the procedure including the risks, benefits and alternatives for the proposed anesthesia with the patient  or authorized representative who has indicated his/her understanding and acceptance.     Dental advisory given  Plan Discussed with: Anesthesiologist and CRNA  Anesthesia Plan Comments: (PAT note by Antionette Poles, PA-C: Follows with cardiology for hx of HTN, HLD, PVCs, atypical chest pain. She was seen by Tereso Newcomer, PA-C 12/03/22 for preop eval. Per note, "Recent echocardiogram demonstrated low normal LV function with EF 50-55%.  There was mild to moderate mitral regurgitation.  Nuclear stress test demonstrated no ischemia. The patient may proceed with her lumbar spine surgery at acceptable risk."  Former smoker with associated COPD, maintained on Anoro Ellipta and PRN albuterol.   OSA, does not use CPAP.  NIDDM2, A1c still in process as of 4pm 01/11/23.  Pt refuses blood products.   Pt states she was not previously advised to hold NSAIDs. Last dose of relafen 01/10/23. Dr. Lovell Sheehan aware.  Preop labs reviewed, creatinine mildly elevated 1.39, mild anemia Hgb 11.0.  EKG 12/30/22: Sinus rhythm with frequent Premature ventricular complexes in a pattern of bigeminy. Rate 76. Poor R wave progression. No significant change.   TTE 12/28/22: 1. Left ventricular ejection fraction, by estimation, is 50 to 55%. Left  ventricular ejection fraction by 3D volume is 51 %. The left ventricle has  low normal function. The left ventricle demonstrates global hypokinesis.  There is moderate concentric  left ventricular hypertrophy. Left ventricular diastolic parameters are  indeterminate. The average left ventricular global longitudinal strain is  -16.1 %. The global longitudinal strain is normal.  2. Right ventricular systolic function is normal. The right ventricular  size  is normal. There is normal pulmonary artery systolic pressure. The  estimated right ventricular systolic pressure is 18.4 mmHg.  3. The mitral valve is normal in structure. Mild to moderate mitral valve  regurgitation. No  evidence of mitral stenosis.  4. The aortic valve is tricuspid. Aortic valve regurgitation is not  visualized. Aortic valve sclerosis is present, with no evidence of aortic  valve stenosis.  5. The inferior vena cava is normal in size with greater than 50%  respiratory variability, suggesting right atrial pressure of 3 mmHg.   Nuclear stress 12/28/22:   Findings are consistent with no ischemia. The study is low risk.   No ST deviation was noted.   LV perfusion is normal.   Left ventricular function is abnormal. Global function is mildly reduced. Nuclear stress EF: 44%. The left ventricular ejection fraction is moderately decreased (30-44%). End diastolic cavity size is normal.   Prior study available for comparison. No changes compared to prior study.  Low risk stress nuclear study with normal perfusion and mildly reduced left ventricular systolic function. Consider nonischemic cardiomyopathy.  )        Anesthesia Quick Evaluation

## 2023-01-12 ENCOUNTER — Observation Stay (HOSPITAL_COMMUNITY)
Admission: RE | Admit: 2023-01-12 | Discharge: 2023-01-14 | Disposition: A | Discharge status: Home / Self Care | Payer: Medicare HMO | Attending: Neurosurgery | Admitting: Neurosurgery

## 2023-01-12 ENCOUNTER — Other Ambulatory Visit: Payer: Self-pay

## 2023-01-12 ENCOUNTER — Ambulatory Visit (HOSPITAL_COMMUNITY): Payer: Medicare HMO

## 2023-01-12 ENCOUNTER — Ambulatory Visit (HOSPITAL_COMMUNITY): Payer: Medicare HMO | Admitting: Physician Assistant

## 2023-01-12 ENCOUNTER — Ambulatory Visit (HOSPITAL_COMMUNITY)
Admission: RE | Disposition: A | Discharge status: Home / Self Care | Payer: Self-pay | Source: Home / Self Care | Attending: Neurosurgery

## 2023-01-12 ENCOUNTER — Encounter (HOSPITAL_COMMUNITY): Payer: Self-pay | Admitting: Neurosurgery

## 2023-01-12 ENCOUNTER — Ambulatory Visit (HOSPITAL_COMMUNITY): Payer: Medicare HMO | Admitting: Anesthesiology

## 2023-01-12 DIAGNOSIS — M5116 Intervertebral disc disorders with radiculopathy, lumbar region: Secondary | ICD-10-CM | POA: Diagnosis not present

## 2023-01-12 DIAGNOSIS — M48062 Spinal stenosis, lumbar region with neurogenic claudication: Secondary | ICD-10-CM

## 2023-01-12 DIAGNOSIS — J449 Chronic obstructive pulmonary disease, unspecified: Secondary | ICD-10-CM | POA: Diagnosis not present

## 2023-01-12 DIAGNOSIS — Z96651 Presence of right artificial knee joint: Secondary | ICD-10-CM | POA: Diagnosis not present

## 2023-01-12 DIAGNOSIS — M4802 Spinal stenosis, cervical region: Secondary | ICD-10-CM | POA: Diagnosis not present

## 2023-01-12 DIAGNOSIS — E119 Type 2 diabetes mellitus without complications: Secondary | ICD-10-CM

## 2023-01-12 DIAGNOSIS — J45909 Unspecified asthma, uncomplicated: Secondary | ICD-10-CM | POA: Diagnosis not present

## 2023-01-12 DIAGNOSIS — M4316 Spondylolisthesis, lumbar region: Principal | ICD-10-CM | POA: Insufficient documentation

## 2023-01-12 DIAGNOSIS — M4726 Other spondylosis with radiculopathy, lumbar region: Secondary | ICD-10-CM | POA: Insufficient documentation

## 2023-01-12 DIAGNOSIS — I10 Essential (primary) hypertension: Secondary | ICD-10-CM | POA: Insufficient documentation

## 2023-01-12 DIAGNOSIS — Z87891 Personal history of nicotine dependence: Secondary | ICD-10-CM | POA: Insufficient documentation

## 2023-01-12 DIAGNOSIS — I2510 Atherosclerotic heart disease of native coronary artery without angina pectoris: Secondary | ICD-10-CM | POA: Diagnosis not present

## 2023-01-12 DIAGNOSIS — Z79899 Other long term (current) drug therapy: Secondary | ICD-10-CM | POA: Insufficient documentation

## 2023-01-12 DIAGNOSIS — E039 Hypothyroidism, unspecified: Secondary | ICD-10-CM | POA: Insufficient documentation

## 2023-01-12 DIAGNOSIS — Z0189 Encounter for other specified special examinations: Secondary | ICD-10-CM | POA: Diagnosis not present

## 2023-01-12 LAB — GLUCOSE, CAPILLARY
Glucose-Capillary: 93 mg/dL (ref 70–99)
Glucose-Capillary: 94 mg/dL (ref 70–99)
Glucose-Capillary: 188 mg/dL — ABNORMAL HIGH (ref 70–99)
Glucose-Capillary: 135 mg/dL — ABNORMAL HIGH (ref 70–99)

## 2023-01-12 SURGERY — POSTERIOR LUMBAR FUSION 1 LEVEL
Anesthesia: General

## 2023-01-12 MED ORDER — LEVOTHYROXINE SODIUM 75 MCG PO TABS
150.0000 ug | ORAL_TABLET | Freq: Every day | ORAL | Status: DC
  Administered 2023-01-13 – 2023-01-14 (×2): 150 ug via ORAL
  Filled 2023-01-12 (×2): qty 2

## 2023-01-12 MED ORDER — OXYCODONE HCL 5 MG PO TABS: 5.0000 mg | ORAL_TABLET | Freq: Once | ORAL | Status: DC | PRN

## 2023-01-12 MED ORDER — ALBUTEROL SULFATE (2.5 MG/3ML) 0.083% IN NEBU: 3.0000 mL | INHALATION_SOLUTION | Freq: Four times a day (QID) | RESPIRATORY_TRACT | Status: DC | PRN

## 2023-01-12 MED ORDER — ALUM & MAG HYDROXIDE-SIMETH 200-200-20 MG/5ML PO SUSP: 30.0000 mL | Freq: Four times a day (QID) | ORAL | Status: DC | PRN

## 2023-01-12 MED ORDER — PANTOPRAZOLE SODIUM 40 MG PO TBEC
80.0000 mg | DELAYED_RELEASE_TABLET | Freq: Every day | ORAL | Status: DC
  Administered 2023-01-13 – 2023-01-14 (×2): 80 mg via ORAL
  Filled 2023-01-12 (×3): qty 2

## 2023-01-12 MED ORDER — CEFAZOLIN SODIUM-DEXTROSE 2-4 GM/100ML-% IV SOLN
2.0000 g | INTRAVENOUS | Status: AC
  Administered 2023-01-12: 2 g via INTRAVENOUS
  Filled 2023-01-12: qty 100

## 2023-01-12 MED ORDER — BACITRACIN ZINC 500 UNIT/GM EX OINT
TOPICAL_OINTMENT | CUTANEOUS | Status: DC | PRN
  Administered 2023-01-12: 1 via TOPICAL

## 2023-01-12 MED ORDER — DAPAGLIFLOZIN PROPANEDIOL 5 MG PO TABS
5.0000 mg | ORAL_TABLET | Freq: Every day | ORAL | Status: DC
  Administered 2023-01-13 – 2023-01-14 (×2): 5 mg via ORAL
  Filled 2023-01-12 (×2): qty 1

## 2023-01-12 MED ORDER — DEXAMETHASONE SODIUM PHOSPHATE 10 MG/ML IJ SOLN
INTRAMUSCULAR | Status: DC | PRN
  Administered 2023-01-12: 10 mg via INTRAVENOUS

## 2023-01-12 MED ORDER — AMLODIPINE BESYLATE 5 MG PO TABS
5.0000 mg | ORAL_TABLET | Freq: Every day | ORAL | Status: DC
  Administered 2023-01-13 – 2023-01-14 (×2): 5 mg via ORAL
  Filled 2023-01-12 (×2): qty 1

## 2023-01-12 MED ORDER — PROPOFOL 10 MG/ML IV BOLUS
INTRAVENOUS | Status: AC
  Filled 2023-01-12: qty 20

## 2023-01-12 MED ORDER — ACETAMINOPHEN 650 MG RE SUPP: 650.0000 mg | RECTAL | Status: DC | PRN

## 2023-01-12 MED ORDER — ACETAMINOPHEN 500 MG PO TABS
1000.0000 mg | ORAL_TABLET | Freq: Once | ORAL | Status: AC
  Administered 2023-01-12: 1000 mg via ORAL
  Filled 2023-01-12: qty 2

## 2023-01-12 MED ORDER — PHENYLEPHRINE 80 MCG/ML (10ML) SYRINGE FOR IV PUSH (FOR BLOOD PRESSURE SUPPORT)
PREFILLED_SYRINGE | INTRAVENOUS | Status: DC | PRN
  Administered 2023-01-12: 40 ug via INTRAVENOUS
  Administered 2023-01-12: 80 ug via INTRAVENOUS
  Administered 2023-01-12: 40 ug via INTRAVENOUS
  Administered 2023-01-12 (×2): 80 ug via INTRAVENOUS

## 2023-01-12 MED ORDER — ONDANSETRON HCL 4 MG PO TABS: 4.0000 mg | ORAL_TABLET | Freq: Four times a day (QID) | ORAL | Status: DC | PRN

## 2023-01-12 MED ORDER — ONDANSETRON HCL 4 MG/2ML IJ SOLN
INTRAMUSCULAR | Status: DC | PRN
  Administered 2023-01-12: 4 mg via INTRAVENOUS

## 2023-01-12 MED ORDER — SUGAMMADEX SODIUM 200 MG/2ML IV SOLN
INTRAVENOUS | Status: DC | PRN
  Administered 2023-01-12: 200 mg via INTRAVENOUS

## 2023-01-12 MED ORDER — DOCUSATE SODIUM 100 MG PO CAPS
100.0000 mg | ORAL_CAPSULE | Freq: Two times a day (BID) | ORAL | Status: DC
  Administered 2023-01-12 – 2023-01-14 (×5): 100 mg via ORAL
  Filled 2023-01-12 (×5): qty 1

## 2023-01-12 MED ORDER — FENTANYL CITRATE (PF) 250 MCG/5ML IJ SOLN
INTRAMUSCULAR | Status: DC | PRN
  Administered 2023-01-12 (×2): 50 ug via INTRAVENOUS
  Administered 2023-01-12: 25 ug via INTRAVENOUS
  Administered 2023-01-12: 50 ug via INTRAVENOUS
  Administered 2023-01-12: 25 ug via INTRAVENOUS
  Administered 2023-01-12: 50 ug via INTRAVENOUS

## 2023-01-12 MED ORDER — SPIRONOLACTONE 12.5 MG HALF TABLET
12.5000 mg | ORAL_TABLET | Freq: Every day | ORAL | Status: DC
  Administered 2023-01-13 – 2023-01-14 (×2): 12.5 mg via ORAL
  Filled 2023-01-12 (×3): qty 1

## 2023-01-12 MED ORDER — OXYCODONE HCL 5 MG PO TABS
10.0000 mg | ORAL_TABLET | ORAL | Status: DC | PRN
  Administered 2023-01-12 – 2023-01-14 (×9): 10 mg via ORAL
  Filled 2023-01-12 (×10): qty 2

## 2023-01-12 MED ORDER — LINACLOTIDE 145 MCG PO CAPS
290.0000 ug | ORAL_CAPSULE | Freq: Every day | ORAL | Status: DC
  Administered 2023-01-13 – 2023-01-14 (×2): 290 ug via ORAL
  Filled 2023-01-12 (×3): qty 2

## 2023-01-12 MED ORDER — ACETAMINOPHEN 10 MG/ML IV SOLN: 1000.0000 mg | Freq: Once | INTRAVENOUS | Status: DC | PRN

## 2023-01-12 MED ORDER — MIDAZOLAM HCL 2 MG/2ML IJ SOLN
INTRAMUSCULAR | Status: AC
  Filled 2023-01-12: qty 2

## 2023-01-12 MED ORDER — CEFAZOLIN SODIUM-DEXTROSE 2-4 GM/100ML-% IV SOLN
2.0000 g | Freq: Three times a day (TID) | INTRAVENOUS | Status: AC
  Administered 2023-01-12 (×2): 2 g via INTRAVENOUS
  Filled 2023-01-12 (×2): qty 100

## 2023-01-12 MED ORDER — BUPIVACAINE-EPINEPHRINE (PF) 0.5% -1:200000 IJ SOLN
INTRAMUSCULAR | Status: AC
  Filled 2023-01-12: qty 30

## 2023-01-12 MED ORDER — AMISULPRIDE (ANTIEMETIC) 5 MG/2ML IV SOLN: 10.0000 mg | Freq: Once | INTRAVENOUS | Status: DC | PRN

## 2023-01-12 MED ORDER — BACITRACIN ZINC 500 UNIT/GM EX OINT
TOPICAL_OINTMENT | CUTANEOUS | Status: AC
  Filled 2023-01-12: qty 28.35

## 2023-01-12 MED ORDER — FENTANYL CITRATE (PF) 250 MCG/5ML IJ SOLN
INTRAMUSCULAR | Status: AC
  Filled 2023-01-12: qty 5

## 2023-01-12 MED ORDER — ZOLPIDEM TARTRATE 5 MG PO TABS: 5.0000 mg | ORAL_TABLET | Freq: Every evening | ORAL | Status: DC | PRN

## 2023-01-12 MED ORDER — ACETAMINOPHEN 325 MG PO TABS
650.0000 mg | ORAL_TABLET | ORAL | Status: DC | PRN
  Administered 2023-01-14: 650 mg via ORAL
  Filled 2023-01-12: qty 2

## 2023-01-12 MED ORDER — DOXEPIN HCL 25 MG PO CAPS
75.0000 mg | ORAL_CAPSULE | Freq: Every day | ORAL | Status: DC
  Administered 2023-01-12 – 2023-01-13 (×2): 75 mg via ORAL
  Filled 2023-01-12: qty 3
  Filled 2023-01-12: qty 1
  Filled 2023-01-12 (×2): qty 3

## 2023-01-12 MED ORDER — VITAMIN B-12 1000 MCG PO TABS
500.0000 ug | ORAL_TABLET | Freq: Every day | ORAL | Status: DC
  Administered 2023-01-13 – 2023-01-14 (×2): 500 ug via ORAL
  Filled 2023-01-12 (×3): qty 1

## 2023-01-12 MED ORDER — ORAL CARE MOUTH RINSE: 15.0000 mL | Freq: Once | OROMUCOSAL | Status: AC

## 2023-01-12 MED ORDER — SURGIRINSE WOUND IRRIGATION SYSTEM - OPTIME
TOPICAL | Status: DC | PRN
  Administered 2023-01-12: 450 mL via TOPICAL

## 2023-01-12 MED ORDER — ALPRAZOLAM 0.5 MG PO TABS
1.0000 mg | ORAL_TABLET | Freq: Three times a day (TID) | ORAL | Status: DC | PRN
  Administered 2023-01-12: 1 mg via ORAL
  Filled 2023-01-12: qty 2

## 2023-01-12 MED ORDER — FESOTERODINE FUMARATE ER 4 MG PO TB24
4.0000 mg | ORAL_TABLET | Freq: Every day | ORAL | Status: DC
  Administered 2023-01-13 – 2023-01-14 (×2): 4 mg via ORAL
  Filled 2023-01-12 (×2): qty 1

## 2023-01-12 MED ORDER — CHLORHEXIDINE GLUCONATE CLOTH 2 % EX PADS: 6.0000 | MEDICATED_PAD | Freq: Once | CUTANEOUS | Status: DC

## 2023-01-12 MED ORDER — OLMESARTAN-AMLODIPINE-HCTZ 40-5-25 MG PO TABS: 1.0000 | ORAL_TABLET | Freq: Every day | ORAL | Status: DC

## 2023-01-12 MED ORDER — AMPHETAMINE-DEXTROAMPHETAMINE 10 MG PO TABS
20.0000 mg | ORAL_TABLET | Freq: Every evening | ORAL | Status: DC
  Filled 2023-01-12: qty 2

## 2023-01-12 MED ORDER — LACTATED RINGERS IV SOLN: INTRAVENOUS | Status: DC

## 2023-01-12 MED ORDER — LORATADINE 10 MG PO TABS
10.0000 mg | ORAL_TABLET | Freq: Every day | ORAL | Status: DC
  Administered 2023-01-13 – 2023-01-14 (×2): 10 mg via ORAL
  Filled 2023-01-12 (×3): qty 1

## 2023-01-12 MED ORDER — BISACODYL 10 MG RE SUPP: 10.0000 mg | Freq: Every day | RECTAL | Status: DC | PRN

## 2023-01-12 MED ORDER — VORTIOXETINE HBR 20 MG PO TABS
20.0000 mg | ORAL_TABLET | Freq: Every day | ORAL | Status: DC
  Administered 2023-01-13 – 2023-01-14 (×2): 20 mg via ORAL
  Filled 2023-01-12 (×3): qty 1

## 2023-01-12 MED ORDER — CYCLOBENZAPRINE HCL 10 MG PO TABS
10.0000 mg | ORAL_TABLET | Freq: Three times a day (TID) | ORAL | Status: DC | PRN
  Administered 2023-01-12 – 2023-01-13 (×3): 10 mg via ORAL
  Filled 2023-01-12 (×3): qty 1

## 2023-01-12 MED ORDER — BUPIVACAINE-EPINEPHRINE (PF) 0.5% -1:200000 IJ SOLN
INTRAMUSCULAR | Status: DC | PRN
  Administered 2023-01-12: 10 mL

## 2023-01-12 MED ORDER — UMECLIDINIUM-VILANTEROL 62.5-25 MCG/ACT IN AEPB
1.0000 | INHALATION_SPRAY | Freq: Every day | RESPIRATORY_TRACT | Status: DC
  Filled 2023-01-12: qty 14

## 2023-01-12 MED ORDER — DEXAMETHASONE SODIUM PHOSPHATE 4 MG/ML IJ SOLN: 4.0000 mg | Freq: Four times a day (QID) | INTRAMUSCULAR | Status: AC

## 2023-01-12 MED ORDER — IRBESARTAN 150 MG PO TABS
300.0000 mg | ORAL_TABLET | Freq: Every day | ORAL | Status: DC
  Administered 2023-01-13: 300 mg via ORAL
  Filled 2023-01-12: qty 2

## 2023-01-12 MED ORDER — 0.9 % SODIUM CHLORIDE (POUR BTL) OPTIME
TOPICAL | Status: DC | PRN
  Administered 2023-01-12: 1000 mL

## 2023-01-12 MED ORDER — ONDANSETRON HCL 4 MG/2ML IJ SOLN
4.0000 mg | Freq: Four times a day (QID) | INTRAMUSCULAR | Status: DC | PRN
  Administered 2023-01-13: 4 mg via INTRAVENOUS
  Filled 2023-01-12: qty 2

## 2023-01-12 MED ORDER — PROPOFOL 10 MG/ML IV BOLUS
INTRAVENOUS | Status: DC | PRN
Start: 2023-01-12 — End: 2023-01-12
  Administered 2023-01-12: 170 mg via INTRAVENOUS
  Administered 2023-01-12: 20 mg via INTRAVENOUS

## 2023-01-12 MED ORDER — MORPHINE SULFATE ER 15 MG PO TBCR
15.0000 mg | EXTENDED_RELEASE_TABLET | Freq: Two times a day (BID) | ORAL | Status: DC
  Administered 2023-01-12 – 2023-01-14 (×4): 15 mg via ORAL
  Filled 2023-01-12 (×4): qty 1

## 2023-01-12 MED ORDER — SCOPOLAMINE 1 MG/3DAYS TD PT72
1.0000 | MEDICATED_PATCH | TRANSDERMAL | Status: DC
  Administered 2023-01-12: 1.5 mg via TRANSDERMAL
  Filled 2023-01-12: qty 1

## 2023-01-12 MED ORDER — BUPIVACAINE LIPOSOME 1.3 % IJ SUSP
INTRAMUSCULAR | Status: AC
  Filled 2023-01-12: qty 20

## 2023-01-12 MED ORDER — THROMBIN 5000 UNITS EX SOLR
CUTANEOUS | Status: AC
  Filled 2023-01-12: qty 5000

## 2023-01-12 MED ORDER — OXYCODONE HCL 5 MG/5ML PO SOLN: 5.0000 mg | Freq: Once | ORAL | Status: DC | PRN

## 2023-01-12 MED ORDER — NITROGLYCERIN 0.4 MG SL SUBL: 0.4000 mg | SUBLINGUAL_TABLET | SUBLINGUAL | Status: DC | PRN

## 2023-01-12 MED ORDER — ACETAMINOPHEN 500 MG PO TABS
1000.0000 mg | ORAL_TABLET | Freq: Four times a day (QID) | ORAL | Status: AC
  Administered 2023-01-12 – 2023-01-13 (×3): 1000 mg via ORAL
  Filled 2023-01-12 (×3): qty 2

## 2023-01-12 MED ORDER — HYDROCHLOROTHIAZIDE 25 MG PO TABS
25.0000 mg | ORAL_TABLET | Freq: Every day | ORAL | Status: DC
  Administered 2023-01-13: 25 mg via ORAL
  Filled 2023-01-12: qty 1

## 2023-01-12 MED ORDER — PHENYLEPHRINE HCL-NACL 20-0.9 MG/250ML-% IV SOLN
INTRAVENOUS | Status: DC | PRN
  Administered 2023-01-12: 25 ug/min via INTRAVENOUS

## 2023-01-12 MED ORDER — ROCURONIUM BROMIDE 10 MG/ML (PF) SYRINGE
PREFILLED_SYRINGE | INTRAVENOUS | Status: DC | PRN
  Administered 2023-01-12: 30 mg via INTRAVENOUS
  Administered 2023-01-12: 100 mg via INTRAVENOUS

## 2023-01-12 MED ORDER — INSULIN ASPART 100 UNIT/ML IJ SOLN: 0.0000 [IU] | INTRAMUSCULAR | Status: DC | PRN

## 2023-01-12 MED ORDER — PHENOL 1.4 % MT LIQD: 1.0000 | OROMUCOSAL | Status: DC | PRN

## 2023-01-12 MED ORDER — BREXPIPRAZOLE 2 MG PO TABS
2.0000 mg | ORAL_TABLET | Freq: Every day | ORAL | Status: DC
  Administered 2023-01-13 – 2023-01-14 (×2): 2 mg via ORAL
  Filled 2023-01-12 (×3): qty 1

## 2023-01-12 MED ORDER — MORPHINE SULFATE (PF) 4 MG/ML IV SOLN: 4.0000 mg | INTRAVENOUS | Status: DC | PRN

## 2023-01-12 MED ORDER — FENTANYL CITRATE (PF) 100 MCG/2ML IJ SOLN
INTRAMUSCULAR | Status: AC
  Filled 2023-01-12: qty 2

## 2023-01-12 MED ORDER — MENTHOL 3 MG MT LOZG: 1.0000 | LOZENGE | OROMUCOSAL | Status: DC | PRN

## 2023-01-12 MED ORDER — BUPIVACAINE LIPOSOME 1.3 % IJ SUSP
INTRAMUSCULAR | Status: DC | PRN
  Administered 2023-01-12: 20 mL

## 2023-01-12 MED ORDER — THROMBIN 5000 UNITS EX SOLR
OROMUCOSAL | Status: DC | PRN
  Administered 2023-01-12 (×2): 5 mL via TOPICAL

## 2023-01-12 MED ORDER — DEXAMETHASONE 4 MG PO TABS
4.0000 mg | ORAL_TABLET | Freq: Four times a day (QID) | ORAL | Status: AC
  Administered 2023-01-12: 4 mg via ORAL
  Filled 2023-01-12: qty 1

## 2023-01-12 MED ORDER — PROMETHAZINE HCL 25 MG PO TABS: 25.0000 mg | ORAL_TABLET | Freq: Three times a day (TID) | ORAL | Status: DC | PRN

## 2023-01-12 MED ORDER — FENTANYL CITRATE (PF) 100 MCG/2ML IJ SOLN
25.0000 ug | INTRAMUSCULAR | Status: DC | PRN
  Administered 2023-01-12 (×2): 50 ug via INTRAVENOUS

## 2023-01-12 MED ORDER — ISOSORBIDE MONONITRATE ER 60 MG PO TB24
30.0000 mg | ORAL_TABLET | Freq: Every day | ORAL | Status: DC
  Administered 2023-01-13 – 2023-01-14 (×2): 30 mg via ORAL
  Filled 2023-01-12 (×3): qty 1

## 2023-01-12 MED ORDER — OXYCODONE HCL 5 MG PO TABS: 5.0000 mg | ORAL_TABLET | ORAL | Status: DC | PRN

## 2023-01-12 MED ORDER — CHLORHEXIDINE GLUCONATE 0.12 % MT SOLN
15.0000 mL | Freq: Once | OROMUCOSAL | Status: AC
  Administered 2023-01-12: 15 mL via OROMUCOSAL
  Filled 2023-01-12: qty 15

## 2023-01-12 MED ORDER — LIDOCAINE 2% (20 MG/ML) 5 ML SYRINGE
INTRAMUSCULAR | Status: DC | PRN
  Administered 2023-01-12: 100 mg via INTRAVENOUS

## 2023-01-12 MED ORDER — FLUTICASONE PROPIONATE 50 MCG/ACT NA SUSP
1.0000 | Freq: Every day | NASAL | Status: DC
  Filled 2023-01-12: qty 16

## 2023-01-12 MED ORDER — AMPHETAMINE-DEXTROAMPHET ER 10 MG PO CP24
30.0000 mg | ORAL_CAPSULE | Freq: Every day | ORAL | Status: DC
  Administered 2023-01-13 – 2023-01-14 (×2): 30 mg via ORAL
  Filled 2023-01-12 (×2): qty 3

## 2023-01-12 SURGICAL SUPPLY — 66 items
APL SKNCLS STERI-STRIP NONHPOA (GAUZE/BANDAGES/DRESSINGS) ×1
BAG COUNTER SPONGE SURGICOUNT (BAG) ×2 IMPLANT
BAG SPNG CNTER NS LX DISP (BAG) ×1
BASKET BONE COLLECTION (BASKET) ×2 IMPLANT
BENZOIN TINCTURE PRP APPL 2/3 (GAUZE/BANDAGES/DRESSINGS) ×2 IMPLANT
BLADE CLIPPER SURG (BLADE) IMPLANT
BUR MATCHSTICK NEURO 3.0 LAGG (BURR) ×2 IMPLANT
BUR PRECISION FLUTE 6.0 (BURR) ×2 IMPLANT
CAGE ALTERA 10X31X9-13 15D (Cage) IMPLANT
CANISTER SUCT 3000ML PPV (MISCELLANEOUS) ×2 IMPLANT
CAP LOCK DLX THRD (Cap) IMPLANT
CNTNR URN SCR LID CUP LEK RST (MISCELLANEOUS) ×2 IMPLANT
CONT SPEC 4OZ STRL OR WHT (MISCELLANEOUS) ×1
COVER BACK TABLE 60X90IN (DRAPES) ×2 IMPLANT
DRAPE C-ARM 42X72 X-RAY (DRAPES) ×4 IMPLANT
DRAPE HALF SHEET 40X57 (DRAPES) ×2 IMPLANT
DRAPE LAPAROTOMY 100X72X124 (DRAPES) ×2 IMPLANT
DRAPE SURG 17X23 STRL (DRAPES) ×8 IMPLANT
DRSG OPSITE POSTOP 4X6 (GAUZE/BANDAGES/DRESSINGS) ×2 IMPLANT
ELECT BLADE 4.0 EZ CLEAN MEGAD (MISCELLANEOUS) ×2
ELECT REM PT RETURN 9FT ADLT (ELECTROSURGICAL) ×1
ELECTRODE BLDE 4.0 EZ CLN MEGD (MISCELLANEOUS) ×2 IMPLANT
ELECTRODE REM PT RTRN 9FT ADLT (ELECTROSURGICAL) ×2 IMPLANT
EVACUATOR 1/8 PVC DRAIN (DRAIN) IMPLANT
GAUZE 4X4 16PLY ~~LOC~~+RFID DBL (SPONGE) ×2 IMPLANT
GLOVE BIO SURGEON STRL SZ 6 (GLOVE) ×2 IMPLANT
GLOVE BIO SURGEON STRL SZ8 (GLOVE) ×4 IMPLANT
GLOVE BIO SURGEON STRL SZ8.5 (GLOVE) ×4 IMPLANT
GLOVE BIOGEL PI IND STRL 6.5 (GLOVE) ×2 IMPLANT
GLOVE EXAM NITRILE XL STR (GLOVE) IMPLANT
GOWN STRL REUS W/ TWL LRG LVL3 (GOWN DISPOSABLE) ×2 IMPLANT
GOWN STRL REUS W/ TWL XL LVL3 (GOWN DISPOSABLE) ×4 IMPLANT
GOWN STRL REUS W/TWL 2XL LVL3 (GOWN DISPOSABLE) IMPLANT
GOWN STRL REUS W/TWL LRG LVL3 (GOWN DISPOSABLE) ×1
GOWN STRL REUS W/TWL XL LVL3 (GOWN DISPOSABLE) ×2
HEMOSTAT POWDER KIT SURGIFOAM (HEMOSTASIS) ×2 IMPLANT
KIT BASIN OR (CUSTOM PROCEDURE TRAY) ×2 IMPLANT
KIT GRAFTMAG DEL NEURO DISP (NEUROSURGERY SUPPLIES) IMPLANT
KIT POSITION SURG JACKSON T1 (MISCELLANEOUS) ×2 IMPLANT
KIT TURNOVER KIT B (KITS) ×2 IMPLANT
NDL HYPO 21X1.5 SAFETY (NEEDLE) IMPLANT
NDL HYPO 22X1.5 SAFETY MO (MISCELLANEOUS) ×2 IMPLANT
NEEDLE HYPO 21X1.5 SAFETY (NEEDLE) ×1 IMPLANT
NEEDLE HYPO 22X1.5 SAFETY MO (MISCELLANEOUS) ×1 IMPLANT
NS IRRIG 1000ML POUR BTL (IV SOLUTION) ×2 IMPLANT
PACK LAMINECTOMY NEURO (CUSTOM PROCEDURE TRAY) ×2 IMPLANT
PAD ARMBOARD 7.5X6 YLW CONV (MISCELLANEOUS) ×6 IMPLANT
PATTIES SURGICAL .5 X1 (DISPOSABLE) IMPLANT
PATTIES SURGICAL 1X1 (DISPOSABLE) IMPLANT
PUTTY DBM 10CC CALC GRAN (Putty) IMPLANT
ROD CREO DLX CVD 6.35X40 (Rod) IMPLANT
SCREW PA DLX CREO 7.5X50 (Screw) IMPLANT
SOLUTION IRRIG SURGIPHOR (IV SOLUTION) ×2 IMPLANT
SPIKE FLUID TRANSFER (MISCELLANEOUS) ×2 IMPLANT
SPONGE NEURO XRAY DETECT 1X3 (DISPOSABLE) IMPLANT
SPONGE SURGIFOAM ABS GEL 100 (HEMOSTASIS) IMPLANT
SPONGE T-LAP 4X18 ~~LOC~~+RFID (SPONGE) IMPLANT
STRIP CLOSURE SKIN 1/2X4 (GAUZE/BANDAGES/DRESSINGS) ×2 IMPLANT
SUT VIC AB 1 CT1 18XBRD ANBCTR (SUTURE) ×4 IMPLANT
SUT VIC AB 1 CT1 8-18 (SUTURE) ×1
SUT VIC AB 2-0 CP2 18 (SUTURE) ×4 IMPLANT
SYR 20ML LL LF (SYRINGE) IMPLANT
TOWEL GREEN STERILE (TOWEL DISPOSABLE) ×2 IMPLANT
TOWEL GREEN STERILE FF (TOWEL DISPOSABLE) ×2 IMPLANT
TRAY FOLEY MTR SLVR 16FR STAT (SET/KITS/TRAYS/PACK) ×2 IMPLANT
WATER STERILE IRR 1000ML POUR (IV SOLUTION) ×2 IMPLANT

## 2023-01-12 NOTE — Progress Notes (Signed)
Orthopedic Tech Progress Note Patient Details:  MICAELA AIRINGTON 1960/08/01 [id number]  Micah Flesher to see If patient had on her ASPEN CERVICAL COLLAR (as order stated), and RN stated patient had back surgery and not neck. I let her know could she cancel the ASPEN CERVICAL COLLAR order and get an ASPEN LUMBAR BRACE order   Ortho Devices Type of Ortho Device: Lumbar corsett Ortho Device/Splint Location: BACK Ortho Device/Splint Interventions: Ordered   Post Interventions Patient Tolerated: Well Instructions Provided: Care of device  Donald Pore 01/12/2023, 2:33 PM

## 2023-01-12 NOTE — H&P (Signed)
Subjective: The patient is a 62 year old black female who was complained of back and bilateral leg pain consistent with neurogenic claudication.  She has failed medical management.  She was worked up with lumbar x-rays and a lumbar MRI which demonstrated a lumbar spondylolisthesis and spinal stenosis.  I discussed the various treatment options with her.  She has decided proceed with surgery.  Past Medical History:  Diagnosis Date   Anxiety    Arthritis    Asthma    Bipolar 1 disorder (HCC)    Breast discharge    Breast lump    Breast pain    CAD (coronary artery disease) 12/03/2022    o Cath 2008: no CAD   o CCTA 10/05/19: CAC score 49, 88th percentile, nonobstructive CAD (LM 1-24 mid and dist)  o Myoview 12/31/20: EF 45, no ischemia or infarction, low risk    Chronic pain    on MS Contin and oxycodone   COPD (chronic obstructive pulmonary disease) (HCC)    Depression    Diabetes mellitus    diet and exercise controlled   Dysrhythmia    HR in 30s on metoprolol   Escherichia coli (E. coli) infection    Fever    GERD (gastroesophageal reflux disease)    History of chest pain    History of knee replacement, total    Hypertension    Hypothyroidism    N&V (nausea and vomiting)    Peripheral neuropathy    Right foot no sensation   Sciatica    Sleep apnea    does not use CPAP   Thyroid disease    Wears glasses     Past Surgical History:  Procedure Laterality Date   ABDOMINAL HYSTERECTOMY     partial   APPENDECTOMY     BIOPSY  03/14/2018   Procedure: BIOPSY;  Surgeon: Charlott Rakes, MD;  Location: [facility];  Service: Endoscopy;;   BREAST DUCTAL SYSTEM EXCISION Right 08/31/2017   Procedure: RIGHT BREAST CENTRAL DUCT EXCISION;  Surgeon: Harriette Bouillon, MD;  Location: [facility];  Service: General;  Laterality: Right;   BREAST EXCISIONAL BIOPSY Right    BREAST LUMPECTOMY     right   CARDIAC CATHETERIZATION     no significant CAD, nl LV function by  11/08/06 cath   CESAREAN SECTION     x4   COLONOSCOPY WITH PROPOFOL N/A 03/14/2018   Procedure: COLONOSCOPY WITH PROPOFOL;  Surgeon: Charlott Rakes, MD;  Location: [facility];  Service: Endoscopy;  Laterality: N/A;   GASTRIC ROUX-EN-Y N/A 11/25/2014   Procedure: LAPAROSCOPIC ROUX-EN-Y GASTRIC BYPASS WITH UPPER ENDOSCOPY;  Surgeon: Luretha Murphy, MD;  Location: [facility];  Service: General;  Laterality: N/A;   HERNIA REPAIR     left inguinal   JOINT REPLACEMENT     KNEE SURGERY     right knee   MOUTH SURGERY     MYOMECTOMY ABDOMINAL APPROACH  1989   POLYPECTOMY  03/14/2018   Procedure: POLYPECTOMY;  Surgeon: Charlott Rakes, MD;  Location: [facility];  Service: Endoscopy;;   TOTAL KNEE REVISION  06/21/2012   Procedure: TOTAL KNEE REVISION;  Surgeon: Nadara Mustard, MD;  Location: [facility];  Service: Orthopedics;  Laterality: Right;  Revision Right Total Knee Arthroplasty    Allergies  Allergen Reactions   Aspirin Other (See Comments), Shortness Of Breath and Anaphylaxis    Pt states she has had Toradol several times without any reactions  Other Reaction(s): Other (See Comments), respiratory distress   Bee Venom  Anaphylaxis   Sulfa Antibiotics Anaphylaxis   Evolocumab     Other Reaction(s): Unknown   Other Other (See Comments)    No blood products.  Patient did  Request that only albumin or albumin-containing products may be administered    Social History   Tobacco Use   Smoking status: Former    Current packs/day: 0.25    Average packs/day: 0.3 packs/day for 44.0 years (11.0 ttl pk-yrs)    Types: Cigarettes    Start date: 46    Quit date: 2020   Smokeless tobacco: Never  Substance Use Topics   Alcohol use: Not Currently    Comment: social    Family History  Problem Relation Age of Onset   Cancer Father        colon   Stroke Father    Heart disease Father    Diabetes Father    Hypertension Father    Depression Sister    Hypertension Mother    Breast cancer  Neg Hx    Prior to Admission medications   Medication Sig Start Date End Date Taking? Authorizing Provider  acetaminophen (TYLENOL) 500 MG tablet Take 1,000 mg by mouth every 6 (six) hours as needed for moderate pain or headache.   Yes [provider]  albuterol (VENTOLIN HFA) 108 (90 Base) MCG/ACT inhaler INHALE 2 PUFFS INTO THE LUNGS EVERY 6 HOURS AS NEEDED FOR WHEEZING OR SHORTNESS OF BREATH 09/23/21  Yes Newlin, Enobong, MD  Alirocumab (PRALUENT) 150 MG/ML SOAJ INJECT 1 PEN INTO SKIN EVERY 14 DAYS. 12/03/22  Yes Weaver, Scott T, PA-C  ALPRAZolam Prudy Feeler) 1 MG tablet Take 1 mg by mouth 3 (three) times daily as needed for anxiety. 07/02/14  Yes [provider]  amphetamine-dextroamphetamine (ADDERALL XR) 30 MG 24 hr capsule Take 30 mg by mouth daily.  03/10/18  Yes [provider]  amphetamine-dextroamphetamine (ADDERALL) 20 MG tablet Take 20 mg by mouth every evening. 10/13/17  Yes [provider]  Cyanocobalamin (VITAMIN B 12 PO) Take 1 tablet by mouth daily. Unsure of dose   Yes [provider]  dapagliflozin propanediol (FARXIGA) 5 MG TABS tablet Take 1 tablet (5 mg total) by mouth daily before breakfast. 10/07/22  Yes Newlin, Enobong, MD  diclofenac Sodium (VOLTAREN) 1 % GEL Apply 2-4 g topically 2 (two) times daily as needed (wrist/knee pain). 11/23/21  Yes Newlin, Odette Horns, MD  doxepin (SINEQUAN) 75 MG capsule Take 75 mg by mouth at bedtime. 03/02/18  Yes [provider]  fluticasone (FLONASE) 50 MCG/ACT nasal spray SHAKE LIQUID AND USE TWO SPRAYS IN EACH NOSTRIL DAILY 12/21/22  Yes Newlin, Enobong, MD  hydrocortisone-pramoxine (ANALPRAM HC) 2.5-1 % rectal cream Place 1 application rectally 3 (three) times daily. 07/13/21  Yes Hoy Register, MD  isosorbide mononitrate (IMDUR) 30 MG 24 hr tablet Take 1 tablet (30 mg total) by mouth daily. 12/11/20  Yes Lyn Records, MD  levothyroxine (SYNTHROID) 150 MCG tablet TAKE 1 TABLET(150 MCG) BY MOUTH DAILY  BEFORE BREAKFAST 05/10/22  Yes Hoy Register, MD  linaclotide (LINZESS) 290 MCG CAPS capsule Take 290 mcg by mouth See admin instructions. Every other night   Yes [provider]  methocarbamol (ROBAXIN) 500 MG tablet Take 1 tablet (500 mg total) by mouth every 8 (eight) hours as needed for muscle spasms. 09/10/21  Yes Petrucelli, Samantha R, PA-C  morphine (MS CONTIN) 15 MG 12 hr tablet Take 1 tablet (15 mg total) by mouth every 12 (twelve) hours. 11/14/14  Yes Jacalyn Lefevre  L, NP  nabumetone (RELAFEN) 750 MG tablet Take 750 mg by mouth 2 (two) times daily. 08/08/19  Yes [provider]  naloxone (NARCAN) nasal spray 4 mg/0.1 mL Place 1 spray into the nose as needed (overdose). 05/22/20  Yes [provider]  nitroGLYCERIN (NITROSTAT) 0.4 MG SL tablet DISSOLVE 1 TABLET UNDER THE TONGUE EVERY 5 MINUTES AS NEEDED 12/03/22  Yes Weaver, Scott T, PA-C  Olmesartan-amLODIPine-HCTZ 40-5-25 MG TABS Take 1 tablet by mouth daily. 12/08/22  Yes Hoy Register, MD  omeprazole (PRILOSEC) 40 MG capsule TAKE 1 CAPSULE(40 MG) BY MOUTH DAILY 01/22/20  Yes Fulp, Cammie, MD  Oxycodone HCl 10 MG TABS Take 1 tablet (10 mg total) by mouth every 6 (six) hours as needed (for pain). 11/14/14  Yes Jones Bales, NP  promethazine (PHENERGAN) 25 MG tablet Take 1 tablet (25 mg total) by mouth every 8 (eight) hours as needed for nausea or vomiting. 12/24/22  Yes Delorse Lek, FNP  REXULTI 2 MG TABS tablet Take 2 mg by mouth daily.   Yes [provider]  solifenacin (VESICARE) 5 MG tablet Take 1 tablet (5 mg total) by mouth daily. 10/07/22  Yes Hoy Register, MD  spironolactone (ALDACTONE) 25 MG tablet Take 0.5 tablets (12.5 mg total) by mouth daily. 12/08/22  Yes Hoy Register, MD  vortioxetine HBr (TRINTELLIX) 20 MG TABS tablet Take 20 mg by mouth daily.   Yes [provider]  Blood Glucose Monitoring Suppl (ACCU-CHEK GUIDE) w/Device KIT 1 kit by Does not apply route 3 (three) times  daily. To check blood sugars 04/10/21   Hoy Register, MD  cetirizine (ZYRTEC) 10 MG tablet Take 1 tablet (10 mg total) by mouth daily. 04/07/21   Hoy Register, MD  glucose blood (ACCU-CHEK GUIDE) test strip Use as instructed 04/10/21   Hoy Register, MD  Lancets (ACCU-CHEK MULTICLIX) lancets Use as instructed 04/10/21   Hoy Register, MD  metoprolol succinate (TOPROL XL) 25 MG 24 hr tablet Take 0.5 tablets (12.5 mg total) by mouth at bedtime. Patient not taking: Reported on 01/11/2023 01/03/23   Tereso Newcomer T, PA-C  predniSONE (DELTASONE) 20 MG tablet Take 2 tablets (40 mg total) by mouth daily with breakfast. Patient not taking: Reported on 01/11/2023 12/30/22   Bing Neighbors, NP  umeclidinium-vilanterol (ANORO ELLIPTA) 62.5-25 MCG/INH AEPB Inhale 1 puff into the lungs daily. 04/17/19   Jetty Duhamel D, MD  escitalopram (LEXAPRO) 20 MG tablet Take 20 mg by mouth daily.  12/14/18  [provider]  LATUDA 120 MG TABS Take 120 mg by mouth at bedtime.  03/04/18 12/14/18  [provider]  LISINOPRIL PO Take by mouth daily.    09/06/11  [provider]  metoCLOPramide (REGLAN) 10 MG tablet Take 1 tablet (10 mg total) by mouth every 8 (eight) hours as needed for nausea. Patient not taking: Reported on 12/06/2019 06/30/18 05/12/20  Mesner, Barbara Cower, MD     Review of Systems  Positive ROS: As above  All other systems have been reviewed and were otherwise negative with the exception of those mentioned in the HPI and as above.  Objective: Vital signs in last 24 hours: Temp:  [98.3 F (36.8 C)-98.4 F (36.9 C)] 98.3 F (36.8 C) (07/31 0610) Pulse Rate:  [64-68] 64 (07/31 0610) Resp:  [17-18] 18 (07/31 0610) BP: (124-127)/(83-92) 124/83 (07/31 0610) SpO2:  [95 %-97 %] 95 % (07/31 0610) Weight:  [107.1 kg-107.4 kg] 107.4 kg (07/31 0610) Estimated body mass index is 39.4 kg/m  as calculated from the following:   Height as of this encounter: 5\' 5"  (1.651 m).   Weight  as of this encounter: 107.4 kg.   General Appearance: Alert, obese Head: Normocephalic, without obvious abnormality, atraumatic Eyes: PERRL, conjunctiva/corneas clear, EOM's intact,    Ears: Normal  Throat: Normal  Neck: Supple, Back: unremarkable Lungs: Clear to auscultation bilaterally, respirations unlabored Heart: Regular rate and rhythm, no murmur, rub or gallop Abdomen: Soft, non-tender Extremities: Extremities normal, atraumatic, no cyanosis or edema Skin: unremarkable  NEUROLOGIC:   Mental status: alert and oriented,Motor Exam - grossly normal Sensory Exam - grossly normal Reflexes:  Coordination - grossly normal Gait - grossly normal Balance - grossly normal Cranial Nerves: I: smell Not tested  II: visual acuity  OS: Normal  OD: Normal   II: visual fields Full to confrontation  II: pupils Equal, round, reactive to light  III,VII: ptosis None  III,IV,VI: extraocular muscles  Full ROM  V: mastication Normal  V: facial light touch sensation  Normal  V,VII: corneal reflex  Present  VII: facial muscle function - upper  Normal  VII: facial muscle function - lower Normal  VIII: hearing Not tested  IX: soft palate elevation  Normal  IX,X: gag reflex Present  XI: trapezius strength  5/5  XI: sternocleidomastoid strength 5/5  XI: neck flexion strength  5/5  XII: tongue strength  Normal    Data Review Lab Results  Component Value Date   WBC 4.3 01/11/2023   HGB 11.0 (L) 01/11/2023   HCT 34.2 (L) 01/11/2023   MCV 89.1 01/11/2023   PLT 225 01/11/2023   Lab Results  Component Value Date   NA 136 01/11/2023   K 3.9 01/11/2023   CL 102 01/11/2023   CO2 25 01/11/2023   BUN 14 01/11/2023   CREATININE 1.39 (H) 01/11/2023   GLUCOSE 84 01/11/2023   Lab Results  Component Value Date   INR 1.16 06/23/2012    Assessment/Plan: L4-5 spondylolisthesis, facet arthropathy, spinal stenosis, lumbago, lumbar radiculopathy, neurogenic claudication: I have discussed the  situation with the patient.  I reviewed her imaging studies with her and pointed out the abnormalities.  We have discussed the various treatment options including surgery.  I described the surgical treatment option of an L4-5 decompression, instrumentation and fusion.  I have shown her surgical models.  I have given her surgical pamphlet.  We have discussed the risk, benefits, alternatives, expected postop course, and likelihood of achieving our goals with surgery.  I have answered all her questions.  She has decided proceed with surgery   [name] 01/12/2023 7:24 AM

## 2023-01-12 NOTE — Transfer of Care (Signed)
Immediate Anesthesia Transfer of Care Note  Patient: Caroline Williams  Procedure(s) Performed: POSTERIOR LUMBAR INTERBODY FUSION,INTERBODY PROSTHESIS,POSTERIOR INSTRUMENTATION LUMBAR FOUR-FIVE  Patient Location: PACU  Anesthesia Type:General  Level of Consciousness: awake and alert   Airway & Oxygen Therapy: Patient Spontanous Breathing and Patient connected to nasal cannula oxygen  Post-op Assessment: Report given to RN and Post -op Vital signs reviewed and stable  Post vital signs: Reviewed and stable  Last Vitals:  Vitals Value Taken Time  BP 175/103   Temp    Pulse 85 01/12/23 1154  Resp 14 01/12/23 1155  SpO2 98 % 01/12/23 1154  Vitals shown include unfiled device data.  Last Pain:  Vitals:   01/12/23 0610  TempSrc: Oral  PainSc: 8       Patients Stated Pain Goal: 0 (01/12/23 0610)  Complications: No notable events documented.

## 2023-01-12 NOTE — Progress Notes (Signed)
Dr. Krista Blue made aware that patient took Nitroglycerin x 2 on 01/10/23 for 5/10 chest pain. Ok per Dr. Krista Blue. No new orders received. Dr. Krista Blue also made aware that patient states she has been instructed to stop taking Metoprolol due to low heart rate. OK per Dr. Krista Blue.

## 2023-01-12 NOTE — Op Note (Signed)
Brief history:The patient is a 62 year old black female who has complained of back and bilateral leg pain consistent with neurogenic claudication.  She has failed medical management and was worked up with a lumbar MRI lumbar x-rays which demonstrated an L4-5 spondylolisthesis spinal stenosis.  I discussed the various treatment options with her.  She has decided proceed with surgery.  Preoperative diagnosis:  L4-5 facet arthropathy, spondylolisthesis,Degenerative disc disease, spinal stenosis compressing both the  L4 and the  L5 nerve roots; lumbago; lumbar radiculopathy; neurogenic claudication  Postoperative diagnosis:  the same  Procedure: bilateral L4-5 Laminotomy/foraminotomies/medial facetectomy to decompress the bilateral L4 and L5 nerve roots(the work required to do this was in addition to the work required to do the posterior lumbar interbody fusion because of the patient's spinal stenosis, facet arthropathy. Etc. requiring a wide decompression of the nerve roots.); L4-5 transforaminal lumbar interbody fusion with local morselized autograft bone and Zimmer DBM; insertion of interbody prosthesis at L4-5 (globus peek expandable interbody prosthesis); posterior nonsegmental instrumentation from L4 to L5 with globus titanium pedicle screws and rods; posterior lateral arthrodesis at L4-5 with local morselized autograft bone and Zimmer DBM.  Surgeon: Dr. Delma Officer  Asst.: Megh Henriette Combs, NP  Anesthesia: Gen. endotracheal  Estimated blood loss: 200 cc  Drains: None  Complications: None  Description of procedure: The patient was brought to the operating room by the anesthesia team. General endotracheal anesthesia was induced. The patient was turned to the prone position on the Wilson frame. The patient's lumbosacral region was then prepared with Betadine scrub and Betadine solution. Sterile drapes were applied.  I then injected the area to be incised with Marcaine with epinephrine  solution. I then used the scalpel to make a linear midline incision over the L4-5 interspace. I then used electrocautery to perform a bilateral subperiosteal dissection exposing the spinous process and lamina of L4 and L5. We then obtained intraoperative radiograph to confirm our location. We then inserted the Verstrac retractor to provide exposure.  I began the decompression by using the high speed drill to perform laminotomies at L4-5 bilaterally. We then used the Kerrison punches to widen the laminotomy and removed the ligamentum flavum at L4-5 bilaterally. We used the Kerrison punches to remove the medial facets at L4-5 bilaterally. We performed wide foraminotomies about the bilateral L4 and L5nerve roots completing the decompression.  We now turned our attention to the posterior lumbar interbody fusion. I used a scalpel to incise the intervertebral disc at L4-5 bilaterally. I then performed a partial intervertebral discectomy at L4-5 bilaterally using the pituitary forceps. We prepared the vertebral endplates at L4-5 bilaterally for the fusion by removing the soft tissues with the curettes. We then used the trial spacers to pick the appropriate sized interbody prosthesis. We prefilled his prosthesis with a combination of local morselized autograft bone that we obtained during the decompression as well as Zimmer DBM. We inserted the prefilled prosthesis into the interspace at L4-5, we then turned and expanded the prosthesis. There was a good snug fit of the prosthesis in the interspace. We then filled and the remainder of the intervertebral disc space with local morselized autograft bone and Zimmer DBM. This completed the posterior lumbar interbody arthrodesis.  During the decompression and insertion of the prosthesis the assistant protected the thecal sac and nerve roots with the D'Errico retractor.  We now turned attention to the instrumentation. Under fluoroscopic guidance we cannulated the bilateral L4  and L5 pedicles with the bone probe. We then removed  the bone probe. We then tapped the pedicle with a 6.5 millimeter tap. We then removed the tap. We probed inside the tapped pedicle with a ball probe to rule out cortical breaches. We then inserted a 7.5 x 50 millimeter pedicle screw into the  L4 and L5 pedicles bilaterally under fluoroscopic guidance. We then palpated along the medial aspect of the pedicles to rule out cortical breaches. There were none. The nerve roots were not injured. We then connected the unilateral pedicle screws with a lordotic rod. We compressed the construct and secured the rod in place with the caps. We then tightened the caps appropriately. This completed the instrumentation from L4-5 bilaterally .  We now turned our attention to the posterior lateral arthrodesis at L4-5. We used the high-speed drill to decorticate the remainder of the facets, pars, transverse process at L4-5. We then applied a combination of local morselized autograft bone and Zimmer DBM over these decorticated posterior lateral structures. This completed the posterior lateral arthrodesis.  We then obtained hemostasis using bipolar electrocautery. We irrigated the wound out with   Betadine solution. We inspected the thecal sac and nerve roots and noted they were well decompressed. We then removed the retractor.  We injected Exparel . We reapproximated patient's thoracolumbar fascia with interrupted #1 Vicryl suture. We reapproximated patient's subcutaneous tissue with interrupted 2-0 Vicryl suture. The reapproximated patient's skin with Steri-Strips and benzoin. The wound was then coated with bacitracin ointment. A sterile dressing was applied. The drapes were removed. The patient was subsequently returned to the supine position where they were extubated by the anesthesia team. He was then transported to the post anesthesia care unit in stable condition. All sponge instrument and needle counts were reportedly  correct at the end of this case.

## 2023-01-12 NOTE — Anesthesia Postprocedure Evaluation (Signed)
Anesthesia Post Note  Patient: Caroline Williams  Procedure(s) Performed: POSTERIOR LUMBAR INTERBODY FUSION,INTERBODY PROSTHESIS,POSTERIOR INSTRUMENTATION LUMBAR FOUR-FIVE     Patient location during evaluation: PACU Anesthesia Type: General Level of consciousness: sedated Pain management: pain level controlled Vital Signs Assessment: post-procedure vital signs reviewed and stable Respiratory status: spontaneous breathing and respiratory function stable Cardiovascular status: stable Postop Assessment: no apparent nausea or vomiting Anesthetic complications: no   No notable events documented.  Last Vitals:  Vitals:   01/12/23 1215 01/12/23 1230  BP: (!) 158/92 (!) 148/86  Pulse: 75 72  Resp: 13 11  Temp:    SpO2: 95% 94%    Last Pain:  Vitals:   01/12/23 0610  TempSrc: Oral  PainSc: 8                  Seana Underwood DANIEL

## 2023-01-12 NOTE — Anesthesia Procedure Notes (Signed)
Procedure Name: Intubation Date/Time: 01/12/2023 7:42 AM  Performed by: Rachel Moulds, CRNAPre-anesthesia Checklist: Patient identified, Emergency Drugs available, Patient being monitored, Suction available and Timeout performed Patient Re-evaluated:Patient Re-evaluated prior to induction Oxygen Delivery Method: Circle system utilized Preoxygenation: Pre-oxygenation with 100% oxygen Induction Type: IV induction Ventilation: Mask ventilation without difficulty and Oral airway inserted - appropriate to patient size Laryngoscope Size: Miller, 3, Mac and 4 Grade View: Grade IV Tube type: Oral Tube size: 7.0 mm Number of attempts: 3 Airway Equipment and Method: Stylet Placement Confirmation: breath sounds checked- equal and bilateral, CO2 detector, positive ETCO2 and ETT inserted through vocal cords under direct vision Secured at: 21 cm Tube secured with: Tape Dental Injury: Teeth and Oropharynx as per pre-operative assessment  Comments: DLx1 with Mac 3 - grade 4 view - esophageal intubation.  DLx1 with MAC 4 - esophageal intubation.  DLx1 by Dr. Krista Blue with Hyacinth Meeker 3 - atraumatic intubation - lips and teeth intact.

## 2023-01-12 NOTE — Care Management CC44 (Signed)
Condition Code 44 Documentation Completed  Patient Details  Name: GAVIN KOHUT MRN: [id number] Date of Birth: 06/01/1961   Condition Code 44 given:  Yes Patient signature on Condition Code 44 notice:  Yes Documentation of 2 MD's agreement:  Yes Code 44 added to claim:  Yes    Kermit Balo, RN 01/12/2023, 3:39 PM

## 2023-01-12 NOTE — Care Management Obs Status (Signed)
MEDICARE OBSERVATION STATUS NOTIFICATION   Patient Details  Name: Caroline Williams MRN: [id number] Date of Birth: [birth year]   Medicare Observation Status Notification Given:  Yes    Kermit Balo, RN 01/12/2023, 3:39 PM

## 2023-01-13 DIAGNOSIS — E039 Hypothyroidism, unspecified: Secondary | ICD-10-CM | POA: Diagnosis not present

## 2023-01-13 DIAGNOSIS — M4726 Other spondylosis with radiculopathy, lumbar region: Secondary | ICD-10-CM | POA: Diagnosis not present

## 2023-01-13 DIAGNOSIS — M4316 Spondylolisthesis, lumbar region: Secondary | ICD-10-CM | POA: Diagnosis not present

## 2023-01-13 DIAGNOSIS — M48062 Spinal stenosis, lumbar region with neurogenic claudication: Secondary | ICD-10-CM | POA: Diagnosis not present

## 2023-01-13 DIAGNOSIS — E119 Type 2 diabetes mellitus without complications: Secondary | ICD-10-CM | POA: Diagnosis not present

## 2023-01-13 DIAGNOSIS — J45909 Unspecified asthma, uncomplicated: Secondary | ICD-10-CM | POA: Diagnosis not present

## 2023-01-13 DIAGNOSIS — I2510 Atherosclerotic heart disease of native coronary artery without angina pectoris: Secondary | ICD-10-CM | POA: Diagnosis not present

## 2023-01-13 DIAGNOSIS — I10 Essential (primary) hypertension: Secondary | ICD-10-CM | POA: Diagnosis not present

## 2023-01-13 DIAGNOSIS — J449 Chronic obstructive pulmonary disease, unspecified: Secondary | ICD-10-CM | POA: Diagnosis not present

## 2023-01-13 LAB — GLUCOSE, CAPILLARY
Glucose-Capillary: 100 mg/dL — ABNORMAL HIGH (ref 70–99)
Glucose-Capillary: 128 mg/dL — ABNORMAL HIGH (ref 70–99)
Glucose-Capillary: 126 mg/dL — ABNORMAL HIGH (ref 70–99)
Glucose-Capillary: 119 mg/dL — ABNORMAL HIGH (ref 70–99)

## 2023-01-13 MED ORDER — UMECLIDINIUM-VILANTEROL 62.5-25 MCG/ACT IN AEPB
1.0000 | INHALATION_SPRAY | Freq: Every day | RESPIRATORY_TRACT | Status: DC
  Administered 2023-01-13 – 2023-01-14 (×2): 1 via RESPIRATORY_TRACT

## 2023-01-13 MED FILL — Thrombin For Soln 5000 Unit: CUTANEOUS | Qty: 5000 | Status: AC

## 2023-01-13 NOTE — Evaluation (Signed)
Physical Therapy Evaluation  Patient Details Name: Caroline Williams MRN: [id number] DOB: January 18, 1961 Today's Date: 01/13/2023  History of Present Illness  Pt is a 62 y/o female presenting 7/31 for planned L4-5 PLIF.  PMH includes: anxiety, arthritis, bipolar 1, CAD, COPD, depression, DM, HTN, hernia repair, R TKR.  Clinical Impression  Pt admitted with above diagnosis. At the time of PT eval, pt was able to demonstrate transfers and ambulation with gross min guard assist to min assist and RW for support. Pt moving very slowly throughout session however nursing staff reports this is baseline per the report they received. Pt unable to tolerate stair training this session - will defer to later session as pt plans to return home today. Pt was educated on precautions, brace application/wearing schedule, appropriate activity progression, and car transfer. Pt currently with functional limitations due to the deficits listed below (see PT Problem List). Pt will benefit from skilled PT to increase their independence and safety with mobility to allow discharge to the venue listed below.          If plan is discharge home, recommend the following: A little help with walking and/or transfers;A little help with bathing/dressing/bathroom;Assistance with cooking/housework;Assist for transportation;Help with stairs or ramp for entrance   Can travel by private vehicle        Equipment Recommendations Rolling walker (2 wheels);BSC/3in1  Recommendations for Other Services       Functional Status Assessment Patient has had a recent decline in their functional status and demonstrates the ability to make significant improvements in function in a reasonable and predictable amount of time.     Precautions / Restrictions Precautions Precautions: Back;Fall Precaution Booklet Issued: Yes (comment) Precaution Comments: Reviewed precautions verbally and pt was cued for maintenance of precautions during functional  mobility. Required Braces or Orthoses: Spinal Brace Spinal Brace: Lumbar corset;Applied in sitting position Restrictions Weight Bearing Restrictions: No      Mobility  Bed Mobility Overal bed mobility: Needs Assistance Bed Mobility: Rolling, Sidelying to Sit Rolling: Min guard Sidelying to sit: Min guard       General bed mobility comments: Increased time but pt able to complete without assist. Hands on guarding for optimal technqiue. HOB elevated and use of rails required.    Transfers Overall transfer level: Needs assistance Equipment used: Rolling walker (2 wheels) Transfers: Sit to/from Stand Sit to Stand: Min assist           General transfer comment: VC's for hand placement on seated surface for safety. Pt with heavy trunk flexion during stand despite cues and assist.    Ambulation/Gait Ambulation/Gait assistance: Min guard, Min assist Gait Distance (Feet): 50 Feet Assistive device: Rolling walker (2 wheels) Gait Pattern/deviations: Step-through pattern, Decreased stride length, Trunk flexed (Low floor clearance) Gait velocity: Decreased Gait velocity interpretation: <1.31 ft/sec, indicative of household ambulator   General Gait Details: VC's for improved posture, closer walker proximity, and forward gaze. No assist required for balance but occasional assist for walker management provided, especially during turns and navigating small space in room.  Stairs            Wheelchair Mobility     Tilt Bed    Modified Rankin (Stroke Patients Only)       Balance Overall balance assessment: Needs assistance Sitting-balance support: No upper extremity supported, Feet supported Sitting balance-Leahy Scale: Fair     Standing balance support: Bilateral upper extremity supported, During functional activity, No upper extremity supported Standing balance-Leahy Scale: Poor Standing  balance comment: relies on RW dynamically, no UE support during ADLs                              Pertinent Vitals/Pain Pain Assessment Pain Assessment: Faces Faces Pain Scale: Hurts little more Pain Location: back-incisional Pain Descriptors / Indicators: Discomfort, Operative site guarding Pain Intervention(s): Limited activity within patient's tolerance, Monitored during session, Repositioned    Home Living Family/patient expects to be discharged to:: Private residence Living Arrangements: Spouse/significant other Available Help at Discharge: Family;Available 24 hours/day Type of Home: House Home Access: Stairs to enter   [employer] of Steps: 3   Home Layout: One level Home Equipment: Cane - single point;Tub bench      Prior Function Prior Level of Function : Independent/Modified Independent             Mobility Comments: uses a cane ADLs Comments: independent, light IADls; not driving     Hand Dominance   Dominant Hand: Right    Extremity/Trunk Assessment   Upper Extremity Assessment Upper Extremity Assessment: Defer to OT evaluation    Lower Extremity Assessment Lower Extremity Assessment: Generalized weakness (Consistent with pre-op diagnosis)    Cervical / Trunk Assessment Cervical / Trunk Assessment: Back Surgery  Communication   Communication: No difficulties  Cognition Arousal/Alertness: Awake/alert (lethargic initially but alert once up and walking) Behavior During Therapy: WFL for tasks assessed/performed Overall Cognitive Status: Within Functional Limits for tasks assessed                                 General Comments: slow processing, increased time to finish sentences but antipcate this is baseline.        General Comments      Exercises     Assessment/Plan    PT Assessment Patient needs continued PT services  PT Problem List Decreased strength;Decreased activity tolerance;Decreased balance;Decreased mobility;Decreased knowledge of use of DME;Decreased safety  awareness;Decreased knowledge of precautions;Pain       PT Treatment Interventions DME instruction;Gait training;Stair training;Functional mobility training;Therapeutic activities;Therapeutic exercise;Patient/family education    PT Goals (Current goals can be found in the Care Plan section)  Acute Rehab PT Goals Patient Stated Goal: Home today PT Goal Formulation: With patient Time For Goal Achievement: 01/20/23 Potential to Achieve Goals: Good    Frequency Min 1X/week     Co-evaluation               AM-PAC PT "6 Clicks" Mobility  Outcome Measure Help needed turning from your back to your side while in a flat bed without using bedrails?: A Little Help needed moving from lying on your back to sitting on the side of a flat bed without using bedrails?: A Little Help needed moving to and from a bed to a chair (including a wheelchair)?: A Little Help needed standing up from a chair using your arms (e.g., wheelchair or bedside chair)?: A Little Help needed to walk in hospital room?: A Little Help needed climbing 3-5 steps with a railing? : A Lot 6 Click Score: 17    End of Session Equipment Utilized During Treatment: Gait belt;Back brace Activity Tolerance: Patient limited by pain;Patient limited by lethargy Patient left: Other (comment) (Sitting EOB with OT present) Nurse Communication: Mobility status PT Visit Diagnosis: Unsteadiness on feet (R26.81);Pain Pain - part of body:  (back)    Time: 8295-6213 PT Time Calculation (  min) (ACUTE ONLY): 16 min   Charges:   PT Evaluation $PT Eval Low Complexity: 1 Low   PT General Charges $$ ACUTE PT VISIT: 1 Visit         Caroline Williams, PT, DPT Acute Rehabilitation Services Secure Chat Preferred Office: [phone]   Caroline Williams 01/13/2023, 11:03 AM

## 2023-01-13 NOTE — Evaluation (Signed)
Occupational Therapy Evaluation Patient Details Name: Caroline Williams MRN: [id number] DOB: Feb 27, 1961 Today's Date: 01/13/2023   History of Present Illness Pt is a 62 y/o female presenting 7/31 for planned L4-5 PLIF.  PMH includes: anxiety, arthritis, bipolar 1, CAD, COPD, depression, DM, HTN, hernia repair, R TKR.   Clinical Impression   Patient admitted for above and presents with problem list below.  PTA pt was independent with ADLs, mobility using cane and light IADLs. Patient was educated on brace mgmt and wear schedule, back precautions, ADL compensatory techniques, AE/DME, mobility progression, safety and recommendations.  Today, pt demonstrated ability to complete bed mobility with min guard assist, transfers using rolling walker with min guard, functional mobility using rolling walker with min guard, and ADLs with up to min guard assist.  She was able to use figure 4 technique for LB dressing, but requires cueing to adhere to spinal precautions during session.  At discharge, pt will have support from significant other who can assist with ADLs as needed.  Based on performance today, pt will benefit from further OT services acutely but anticipate no further needs after dc home.  OT will follow.       Recommendations for follow up therapy are one component of a multi-disciplinary discharge planning process, led by the attending physician.  Recommendations may be updated based on patient status, additional functional criteria and insurance authorization.   Assistance Recommended at Discharge Frequent or constant Supervision/Assistance  Patient can return home with the following A little help with walking and/or transfers;A little help with bathing/dressing/bathroom;Assistance with cooking/housework;Help with stairs or ramp for entrance;Assist for transportation    Functional Status Assessment  Patient has had a recent decline in their functional status and demonstrates the ability to make  significant improvements in function in a reasonable and predictable amount of time.  Equipment Recommendations  BSC/3in1;Other (comment) (RW)    Recommendations for Other Services       Precautions / Restrictions Precautions Precautions: Back Precaution Booklet Issued: Yes (comment) Precaution Comments: reviewed with pt, cueing to adhere to and recall Required Braces or Orthoses: Spinal Brace Spinal Brace: Lumbar corset;Applied in sitting position Restrictions Weight Bearing Restrictions: No      Mobility Bed Mobility Overal bed mobility: Needs Assistance Bed Mobility: Rolling, Sit to Sidelying Rolling: Min guard       Sit to sidelying: Min guard General bed mobility comments: cueing for log roll technique and adherance to back precautions    Transfers Overall transfer level: Needs assistance Equipment used: Rolling walker (2 wheels) Transfers: Sit to/from Stand Sit to Stand: Min guard           General transfer comment: cueing for hand placement and posture, increased time but no physical assistance required      Balance Overall balance assessment: Needs assistance Sitting-balance support: No upper extremity supported, Feet supported Sitting balance-Leahy Scale: Fair     Standing balance support: Bilateral upper extremity supported, During functional activity, No upper extremity supported Standing balance-Leahy Scale: Poor Standing balance comment: relies on RW dynamically, no UE support during ADLs                           ADL either performed or assessed with clinical judgement   ADL Overall ADL's : Needs assistance/impaired     Grooming: Set up;Sitting           Upper Body Dressing : Set up;Sitting   Lower Body Dressing: Min guard;Sit  to/from stand;Cueing for compensatory techniques;Adhering to back precautions Lower Body Dressing Details (indicate cue type and reason): able to complete figure 4 technique to don underwear, pt does  not wear socks Toilet Transfer: Ambulation;Min guard;Rolling walker (2 wheels);Cueing for safety   Toileting- Clothing Manipulation and Hygiene: Min guard;Sit to/from stand;Cueing for compensatory techniques;Cueing for back precautions     Tub/Shower Transfer Details (indicate cue type and reason): simulated, difficulty raising feet over tub; pt then reports she acutally has tub transfer bench Functional mobility during ADLs: Min guard;Rolling walker (2 wheels);Cueing for safety General ADL Comments: pt limited by pain and decreased tolerance, pt very slow moving but able to complete ADLs with cueing.  will have support at home     Vision   Vision Assessment?: No apparent visual deficits     Perception     Praxis      Pertinent Vitals/Pain Pain Assessment Pain Assessment: Faces Faces Pain Scale: Hurts little more Pain Location: back-incisional Pain Descriptors / Indicators: Discomfort, Operative site guarding Pain Intervention(s): Limited activity within patient's tolerance, Monitored during session, Repositioned     Hand Dominance Right   Extremity/Trunk Assessment Upper Extremity Assessment Upper Extremity Assessment: Overall WFL for tasks assessed   Lower Extremity Assessment Lower Extremity Assessment: Defer to PT evaluation   Cervical / Trunk Assessment Cervical / Trunk Assessment: Back Surgery   Communication Communication Communication: No difficulties   Cognition Arousal/Alertness: Awake/alert Behavior During Therapy: WFL for tasks assessed/performed Overall Cognitive Status: Within Functional Limits for tasks assessed                                 General Comments: slow processing, but antiipcate this is baseline.     General Comments       Exercises     Shoulder Instructions      Home Living Family/patient expects to be discharged to:: Private residence Living Arrangements: Spouse/significant other Available Help at Discharge:  Family;Available 24 hours/day Type of Home: House Home Access: Stairs to enter [employer] of Steps: 3   Home Layout: One level     Bathroom Shower/Tub: Chief Strategy Officer: Standard     Home Equipment: Cane - single point;Tub bench          Prior Functioning/Environment Prior Level of Function : Independent/Modified Independent             Mobility Comments: uses a cane ADLs Comments: independent, light IADls; not driving        OT Problem List: Decreased activity tolerance;Impaired balance (sitting and/or standing);Pain;Obesity;Decreased knowledge of precautions;Decreased knowledge of use of DME or AE;Decreased safety awareness      OT Treatment/Interventions: Self-care/ADL training;Therapeutic exercise;DME and/or AE instruction;Therapeutic activities;Patient/family education;Balance training    OT Goals(Current goals can be found in the care plan section) Acute Rehab OT Goals Patient Stated Goal: home OT Goal Formulation: With patient Time For Goal Achievement: 01/27/23 Potential to Achieve Goals: Good  OT Frequency: Min 1X/week    Co-evaluation              AM-PAC OT "6 Clicks" Daily Activity     Outcome Measure Help from another person eating meals?: None Help from another person taking care of personal grooming?: A Little Help from another person toileting, which includes using toliet, bedpan, or urinal?: A Little Help from another person bathing (including washing, rinsing, drying)?: A Little Help from another person to put on and  taking off regular upper body clothing?: A Little Help from another person to put on and taking off regular lower body clothing?: A Little 6 Click Score: 19   End of Session Equipment Utilized During Treatment: Rolling walker (2 wheels);Back brace Nurse Communication: Mobility status  Activity Tolerance: Patient tolerated treatment well Patient left: in bed;with call bell/phone within  reach  OT Visit Diagnosis: Other abnormalities of gait and mobility (R26.89);Pain Pain - part of body:  (back)                Time: 0981-1914 OT Time Calculation (min): 21 min Charges:  OT General Charges $OT Visit: 1 Visit OT Evaluation $OT Eval Low Complexity: 1 Low  Barry Brunner, OT Acute Rehabilitation Services Office [phone]   Chancy Milroy 01/13/2023, 9:59 AM

## 2023-01-13 NOTE — Progress Notes (Signed)
Subjective: The patient is alert and pleasant.  Her back is appropriately sore.  Objective: Vital signs in last 24 hours: Temp:  [97.7 F (36.5 C)-99.7 F (37.6 C)] 98.4 F (36.9 C) (08/01 0326) Pulse Rate:  [42-95] 79 (08/01 0326) Resp:  [10-22] 18 (08/01 0326) BP: (124-176)/(78-103) 136/90 (08/01 0326) SpO2:  [92 %-100 %] 97 % (08/01 0326) Estimated body mass index is 39.4 kg/m as calculated from the following:   Height as of this encounter: 5\' 5"  (1.651 m).   Weight as of this encounter: 107.4 kg.   Intake/Output from previous day: 07/31 0701 - 08/01 0700 In: 1660 [P.O.:360; I.V.:1200; IV Piggyback:100] Out: 1275 [Urine:1000; Blood:275] Intake/Output this shift: No intake/output data recorded.  Physical exam the patient is alert and pleasant.  Her lower extremity strength is grossly normal.  Her dressing has been changed but is presently clean and dry.  Lab Results: Recent Labs    01/11/23 0918  WBC 4.3  HGB 11.0*  HCT 34.2*  PLT 225   BMET Recent Labs    01/11/23 0918  NA 136  K 3.9  CL 102  CO2 25  GLUCOSE 84  BUN 14  CREATININE 1.39*  CALCIUM 8.8*    Studies/Results: DG Lumbar Spine 2-3 Views  Result Date: 01/12/2023 CLINICAL DATA:  Elective surgery. EXAM: LUMBAR SPINE - 2-3 VIEW COMPARISON:  None Available. FINDINGS: Two fluoroscopic spot views of the lumbar spine obtained in the operating room. Pedicle screws at L4 and L5 with interbody spacer. Fluoroscopy time 16 seconds. Dose 24.47 mGy. IMPRESSION: Intraoperative fluoroscopy during lumbar fusion. Electronically Signed   By: Narda Rutherford M.D.   On: 01/12/2023 14:32   DG Lumbar Spine 1 View  Result Date: 01/12/2023 CLINICAL DATA:  Elective surgery, intraop for localization. EXAM: LUMBAR SPINE - 1 VIEW COMPARISON:  Preoperative radiograph 06/11/2022 FINDINGS: Portable cross-table lateral view of the lumbar spine obtained in the operating room. Surgical instrument localizes posteriorly at the L4  level. IMPRESSION: Surgical instrument localizes posteriorly at the L4 level. Electronically Signed   By: Narda Rutherford M.D.   On: 01/12/2023 12:28   DG C-Arm 1-60 Min-No Report  Result Date: 01/12/2023 Fluoroscopy was utilized by the requesting physician.  No radiographic interpretation.    Assessment/Plan: Postop day #1: The patient would likely go home later on today after she works with physical therapy.  LOS: 1 day     [name] 01/13/2023, 7:36 AM     Patient ID: Caroline Williams, female   DOB: [birth year], 62 y.o.   MRN: [id number]

## 2023-01-13 NOTE — Progress Notes (Signed)
Physical Therapy Treatment  Patient Details Name: ZEHAVA HARDNETT MRN: [id number] DOB: 02/22/1961 Today's Date: 01/13/2023   History of Present Illness Pt is a 62 y/o female presenting 7/31 for planned L4-5 PLIF.  PMH includes: anxiety, arthritis, bipolar 1, CAD, COPD, depression, DM, HTN, hernia repair, R TKR.    PT Comments  Pt seen for second session to focus on stair training prior to planned d/c home. She was able to demonstrate transfers and ambulation with gross min guard assist, however required up to max assist for stair negotiation. Pt will require a second person at home for optimal safety with entering home or ambulance transfer. Pt reports she would like to stay another day and continue to work with therapies for stair training prior to d/c. Feel this is reasonable as pt is at a high risk for falls at this time. Pt's boyfriend on speaker phone at end of session and agreeable to be present tomorrow at 9:30 am for education. Today, reinforced education on precautions, brace application/wearing schedule, appropriate activity progression, and car transfer. Will continue to follow.      If plan is discharge home, recommend the following: A little help with walking and/or transfers;A little help with bathing/dressing/bathroom;Assistance with cooking/housework;Assist for transportation;Help with stairs or ramp for entrance   Can travel by private vehicle        Equipment Recommendations  Rolling walker (2 wheels);BSC/3in1    Recommendations for Other Services       Precautions / Restrictions Precautions Precautions: Back;Fall Precaution Booklet Issued: Yes (comment) Precaution Comments: Reviewed precautions verbally and pt was cued for maintenance of precautions during functional mobility. Required Braces or Orthoses: Spinal Brace Spinal Brace: Lumbar corset;Applied in sitting position Restrictions Weight Bearing Restrictions: No     Mobility  Bed Mobility Overal bed mobility:  Needs Assistance Bed Mobility: Rolling, Sidelying to Sit, Sit to Sidelying Rolling: Min guard Sidelying to sit: Min guard     Sit to sidelying: Min guard General bed mobility comments: Increased time but pt able to complete without assist. Hands on guarding for optimal technqiue. HOB flat and rails lowered to simulate home environment.    Transfers Overall transfer level: Needs assistance Equipment used: Rolling walker (2 wheels) Transfers: Sit to/from Stand Sit to Stand: Min guard           General transfer comment: VC's for hand placement on seated surface for safety. Pt with heavy trunk flexion during stand despite cues.    Ambulation/Gait Ambulation/Gait assistance: Min guard Gait Distance (Feet): 40 Feet Assistive device: Rolling walker (2 wheels) Gait Pattern/deviations: Step-through pattern, Decreased stride length, Trunk flexed (Low floor clearance) Gait velocity: Decreased Gait velocity interpretation: <1.31 ft/sec, indicative of household ambulator   General Gait Details: VC's for improved posture, closer walker proximity, and forward gaze. No assist required for balance but pt moving very slowly to advance feet.   Stairs Stairs: Yes Stairs assistance: Max assist, +2 safety/equipment Stair Management: Two rails, One rail Left, Step to pattern, Forwards (HHA) Number of Stairs: 1 (x3 trials) General stair comments: Heavy assist required to negotiate 1 step at this time. Started with B rails and progressed to [facility] but pt requiring max assist through R HHA for support. Pt reports she has lattice by her stairs that she typically pulls up from, but no rails.   Wheelchair Mobility     Tilt Bed    Modified Rankin (Stroke Patients Only)       Balance Overall balance assessment: Needs assistance  Sitting-balance support: No upper extremity supported, Feet supported Sitting balance-Leahy Scale: Fair     Standing balance support: Bilateral upper extremity  supported, During functional activity, No upper extremity supported Standing balance-Leahy Scale: Poor Standing balance comment: relies on RW dynamically, no UE support during ADLs                            Cognition Arousal/Alertness: Awake/alert Behavior During Therapy: WFL for tasks assessed/performed Overall Cognitive Status: Within Functional Limits for tasks assessed                                 General Comments: slow processing, increased time to finish sentences but antipcate this is baseline.        Exercises      General Comments        Pertinent Vitals/Pain Pain Assessment Pain Assessment: Faces Faces Pain Scale: Hurts little more Pain Location: back-incisional Pain Descriptors / Indicators: Discomfort, Operative site guarding Pain Intervention(s): Limited activity within patient's tolerance, Monitored during session, Repositioned    Home Living Family/patient expects to be discharged to:: Private residence Living Arrangements: Spouse/significant other Available Help at Discharge: Family;Available 24 hours/day Type of Home: House Home Access: Stairs to enter   [employer] of Steps: 3   Home Layout: One level Home Equipment: Cane - single point;Tub bench      Prior Function            PT Goals (current goals can now be found in the care plan section) Acute Rehab PT Goals Patient Stated Goal: Home today PT Goal Formulation: With patient Time For Goal Achievement: 01/20/23 Potential to Achieve Goals: Good Progress towards PT goals: Progressing toward goals    Frequency    Min 1X/week      PT Plan Current plan remains appropriate    Co-evaluation              AM-PAC PT "6 Clicks" Mobility   Outcome Measure  Help needed turning from your back to your side while in a flat bed without using bedrails?: A Little Help needed moving from lying on your back to sitting on the side of a flat bed without  using bedrails?: A Little Help needed moving to and from a bed to a chair (including a wheelchair)?: A Little Help needed standing up from a chair using your arms (e.g., wheelchair or bedside chair)?: A Little Help needed to walk in hospital room?: A Little Help needed climbing 3-5 steps with a railing? : A Lot 6 Click Score: 17    End of Session Equipment Utilized During Treatment: Gait belt;Back brace Activity Tolerance: Patient limited by pain;Patient limited by lethargy Patient left: in bed;with call bell/phone within reach Nurse Communication: Mobility status PT Visit Diagnosis: Unsteadiness on feet (R26.81);Pain Pain - part of body:  (back)     Time: 1015-1050 PT Time Calculation (min) (ACUTE ONLY): 35 min  Charges:    $Gait Training: 23-37 mins PT General Charges $$ ACUTE PT VISIT: 1 Visit                     Conni Slipper, PT, DPT Acute Rehabilitation Services Secure Chat Preferred Office: [phone]    Marylynn Pearson 01/13/2023, 11:09 AM

## 2023-01-14 DIAGNOSIS — M4316 Spondylolisthesis, lumbar region: Secondary | ICD-10-CM | POA: Diagnosis not present

## 2023-01-14 DIAGNOSIS — R32 Unspecified urinary incontinence: Secondary | ICD-10-CM | POA: Diagnosis not present

## 2023-01-14 DIAGNOSIS — I2510 Atherosclerotic heart disease of native coronary artery without angina pectoris: Secondary | ICD-10-CM | POA: Diagnosis not present

## 2023-01-14 DIAGNOSIS — I10 Essential (primary) hypertension: Secondary | ICD-10-CM | POA: Diagnosis not present

## 2023-01-14 DIAGNOSIS — M48062 Spinal stenosis, lumbar region with neurogenic claudication: Secondary | ICD-10-CM | POA: Diagnosis not present

## 2023-01-14 DIAGNOSIS — M4726 Other spondylosis with radiculopathy, lumbar region: Secondary | ICD-10-CM | POA: Diagnosis not present

## 2023-01-14 DIAGNOSIS — J449 Chronic obstructive pulmonary disease, unspecified: Secondary | ICD-10-CM | POA: Diagnosis not present

## 2023-01-14 DIAGNOSIS — J45909 Unspecified asthma, uncomplicated: Secondary | ICD-10-CM | POA: Diagnosis not present

## 2023-01-14 DIAGNOSIS — E119 Type 2 diabetes mellitus without complications: Secondary | ICD-10-CM | POA: Diagnosis not present

## 2023-01-14 DIAGNOSIS — E039 Hypothyroidism, unspecified: Secondary | ICD-10-CM | POA: Diagnosis not present

## 2023-01-14 LAB — GLUCOSE, CAPILLARY: Glucose-Capillary: 99 mg/dL (ref 70–99)

## 2023-01-14 MED ORDER — OXYCODONE-ACETAMINOPHEN 5-325 MG PO TABS
1.0000 | ORAL_TABLET | ORAL | Status: DC | PRN
  Administered 2023-01-14: 2 via ORAL
  Filled 2023-01-14: qty 2

## 2023-01-14 MED ORDER — DOCUSATE SODIUM 100 MG PO CAPS: 100.0000 mg | ORAL_CAPSULE | Freq: Two times a day (BID) | ORAL | 0 refills | Status: DC

## 2023-01-14 MED ORDER — CYCLOBENZAPRINE HCL 10 MG PO TABS: 10.0000 mg | ORAL_TABLET | Freq: Three times a day (TID) | ORAL | 0 refills | Status: DC | PRN

## 2023-01-14 MED ORDER — OXYCODONE-ACETAMINOPHEN 5-325 MG PO TABS: 1.0000 | ORAL_TABLET | ORAL | 0 refills | Status: DC | PRN

## 2023-01-14 NOTE — Discharge Summary (Signed)
Physician Discharge Summary  Patient ID: Caroline Williams MRN: [id number] DOB/AGE: 10-05-1960 62 y.o.  Admit date: 01/12/2023 Discharge date: 01/14/2023  Admission Diagnoses: Lumbar spondylolisthesis, lumbar facet arthropathy, lumbar spinal stenosis, lumbar radiculopathy, neurogenic claudication, lumbago  Discharge Diagnoses: The same Principal Problem:   Spondylolisthesis, lumbar region   Discharged Condition: good  Hospital Course: I performed an L4-5 decompression, instrumentation and fusion on the patient on 01/12/2023.  The surgery went well.  The patient's postoperative course was unremarkable.  On postoperative day #2 the patient requested discharge home.  She was given written and verbal discharge instructions.  All questions were answered.  Consults: PT, care management Significant Diagnostic Studies: None Treatments: L4-5 decompression, instrumentation and fusion Discharge Exam: Blood pressure 110/66, pulse 89, temperature 99.6 F (37.6 C), temperature source Oral, resp. rate 20, height 5\' 5"  (1.651 m), weight 107.4 kg, SpO2 98%. The patient is alert and pleasant.  Her dressing is clean and dry.  Her strength is normal.  Disposition: Home  Discharge Instructions     Call MD for:  difficulty breathing, headache or visual disturbances   Complete by: As directed    Call MD for:  extreme fatigue   Complete by: As directed    Call MD for:  hives   Complete by: As directed    Call MD for:  persistant dizziness or light-headedness   Complete by: As directed    Call MD for:  persistant nausea and vomiting   Complete by: As directed    Call MD for:  redness, tenderness, or signs of infection (pain, swelling, redness, odor or green/yellow discharge around incision site)   Complete by: As directed    Call MD for:  severe uncontrolled pain   Complete by: As directed    Call MD for:  temperature >100.4   Complete by: As directed    Diet - low sodium heart healthy   Complete  by: As directed    Discharge instructions   Complete by: As directed    Call [phone] for a followup appointment. Take a stool softener while you are using pain medications.   Driving Restrictions   Complete by: As directed    Do not drive for 2 weeks.   Increase activity slowly   Complete by: As directed    Lifting restrictions   Complete by: As directed    Do not lift more than 5 pounds. No excessive bending or twisting.   May shower / Bathe   Complete by: As directed    Remove the dressing for 3 days after surgery.  You may shower, but leave the incision alone.   Remove dressing in 48 hours   Complete by: As directed       Allergies as of 01/14/2023       Reactions   Aspirin Other (See Comments), Shortness Of Breath, Anaphylaxis   Pt states she has had Toradol several times without any reactions Other Reaction(s): Other (See Comments), respiratory distress   Bee Venom Anaphylaxis   Sulfa Antibiotics Anaphylaxis   Evolocumab    Other Reaction(s): Unknown   Other Other (See Comments)   No blood products.  Patient did  Request that only albumin or albumin-containing products may be administered        Medication List     STOP taking these medications    acetaminophen 500 MG tablet Commonly known as: TYLENOL   methocarbamol 500 MG tablet Commonly known as: ROBAXIN   nabumetone 750 MG tablet Commonly  known as: RELAFEN   Oxycodone HCl 10 MG Tabs   predniSONE 20 MG tablet Commonly known as: DELTASONE       TAKE these medications    Accu-Chek Guide test strip Generic drug: glucose blood Use as instructed   Accu-Chek Guide w/Device Kit 1 kit by Does not apply route 3 (three) times daily. To check blood sugars   accu-chek multiclix lancets Use as instructed   albuterol 108 (90 Base) MCG/ACT inhaler Commonly known as: VENTOLIN HFA INHALE 2 PUFFS INTO THE LUNGS EVERY 6 HOURS AS NEEDED FOR WHEEZING OR SHORTNESS OF BREATH   ALPRAZolam 1 MG  tablet Commonly known as: XANAX Take 1 mg by mouth 3 (three) times daily as needed for anxiety.   amphetamine-dextroamphetamine 20 MG tablet Commonly known as: ADDERALL Take 20 mg by mouth every evening.   amphetamine-dextroamphetamine 30 MG 24 hr capsule Commonly known as: ADDERALL XR Take 30 mg by mouth daily.   Anoro Ellipta 62.5-25 MCG/INH Aepb Generic drug: umeclidinium-vilanterol Inhale 1 puff into the lungs daily.   cetirizine 10 MG tablet Commonly known as: ZYRTEC Take 1 tablet (10 mg total) by mouth daily.   cyclobenzaprine 10 MG tablet Commonly known as: FLEXERIL Take 1 tablet (10 mg total) by mouth 3 (three) times daily as needed for muscle spasms.   dapagliflozin propanediol 5 MG Tabs tablet Commonly known as: Farxiga Take 1 tablet (5 mg total) by mouth daily before breakfast.   diclofenac Sodium 1 % Gel Commonly known as: VOLTAREN Apply 2-4 g topically 2 (two) times daily as needed (wrist/knee pain).   docusate sodium 100 MG capsule Commonly known as: COLACE Take 1 capsule (100 mg total) by mouth 2 (two) times daily.   doxepin 75 MG capsule Commonly known as: SINEQUAN Take 75 mg by mouth at bedtime.   fluticasone 50 MCG/ACT nasal spray Commonly known as: FLONASE SHAKE LIQUID AND USE TWO SPRAYS IN EACH NOSTRIL DAILY   hydrocortisone-pramoxine 2.5-1 % rectal cream Commonly known as: Analpram HC Place 1 application rectally 3 (three) times daily.   isosorbide mononitrate 30 MG 24 hr tablet Commonly known as: IMDUR Take 1 tablet (30 mg total) by mouth daily.   levothyroxine 150 MCG tablet Commonly known as: SYNTHROID TAKE 1 TABLET(150 MCG) BY MOUTH DAILY BEFORE BREAKFAST   linaclotide 290 MCG Caps capsule Commonly known as: LINZESS Take 290 mcg by mouth See admin instructions. Every other night   metoprolol succinate 25 MG 24 hr tablet Commonly known as: Toprol XL Take 0.5 tablets (12.5 mg total) by mouth at bedtime.   morphine 15 MG 12 hr  tablet Commonly known as: MS Contin Take 1 tablet (15 mg total) by mouth every 12 (twelve) hours.   naloxone 4 MG/0.1ML Liqd nasal spray kit Commonly known as: NARCAN Place 1 spray into the nose as needed (overdose).   nitroGLYCERIN 0.4 MG SL tablet Commonly known as: NITROSTAT DISSOLVE 1 TABLET UNDER THE TONGUE EVERY 5 MINUTES AS NEEDED   Olmesartan-amLODIPine-HCTZ 40-5-25 MG Tabs Take 1 tablet by mouth daily.   omeprazole 40 MG capsule Commonly known as: PRILOSEC TAKE 1 CAPSULE(40 MG) BY MOUTH DAILY   oxyCODONE-acetaminophen 5-325 MG tablet Commonly known as: PERCOCET/ROXICET Take 1-2 tablets by mouth every 4 (four) hours as needed for moderate pain.   Praluent 150 MG/ML Soaj Generic drug: Alirocumab INJECT 1 PEN INTO SKIN EVERY 14 DAYS.   promethazine 25 MG tablet Commonly known as: PHENERGAN Take 1 tablet (25 mg total) by mouth every 8 (eight)  hours as needed for nausea or vomiting.   Rexulti 2 MG Tabs tablet Generic drug: brexpiprazole Take 2 mg by mouth daily.   solifenacin 5 MG tablet Commonly known as: VESIcare Take 1 tablet (5 mg total) by mouth daily.   spironolactone 25 MG tablet Commonly known as: ALDACTONE Take 0.5 tablets (12.5 mg total) by mouth daily.   VITAMIN B 12 PO Take 1 tablet by mouth daily. Unsure of dose   vortioxetine HBr 20 MG Tabs tablet Commonly known as: TRINTELLIX Take 20 mg by mouth daily.         Signed: [name] 01/14/2023, 6:49 AM

## 2023-01-14 NOTE — Progress Notes (Signed)
Occupational Therapy Treatment Patient Details Name: Caroline Williams MRN: [id number] DOB: 1961/04/14 Today's Date: 01/14/2023   History of present illness Pt is a 62 y/o female presenting 7/31 for planned L4-5 PLIF.  PMH includes: anxiety, arthritis, bipolar 1, CAD, COPD, depression, DM, HTN, hernia repair, R TKR.   OT comments  Patient supine in bed, significant other at side.  Pt able to recall back precautions, but requires cueing to adhere to functionally.  Completing bed mobility with supervision, transfers with supervision, and ADLs with supervision.  She continues to require cueing for posture and RW mgmt throughout session.  Instructed significant other on now to assist at home, voices understanding. Pt/significant other with no further questions.  Will follow acutely.    Recommendations for follow up therapy are one component of a multi-disciplinary discharge planning process, led by the attending physician.  Recommendations may be updated based on patient status, additional functional criteria and insurance authorization.    Assistance Recommended at Discharge Frequent or constant Supervision/Assistance  Patient can return home with the following  A little help with walking and/or transfers;A little help with bathing/dressing/bathroom;Assistance with cooking/housework;Help with stairs or ramp for entrance;Assist for transportation   Equipment Recommendations  BSC/3in1;Other (comment) (RW)    Recommendations for Other Services      Precautions / Restrictions Precautions Precautions: Back;Fall Precaution Booklet Issued: Yes (comment) Precaution Comments: Reviewed precautions verbally and pt was cued for maintenance of precautions during functional mobility and ADLs Required Braces or Orthoses: Spinal Brace Spinal Brace: Lumbar corset Restrictions Weight Bearing Restrictions: No       Mobility Bed Mobility Overal bed mobility: Needs Assistance Bed Mobility: Rolling,  Sidelying to Sit Rolling: Supervision Sidelying to sit: Supervision       General bed mobility comments: cueing for technique, needing assist with bed rail and increased time.    Transfers Overall transfer level: Needs assistance Equipment used: Rolling walker (2 wheels) Transfers: Sit to/from Stand Sit to Stand: Supervision           General transfer comment: cueing for hand placement and posture, heaving trunk flexion despite cues     Balance Overall balance assessment: Needs assistance Sitting-balance support: No upper extremity supported, Feet supported Sitting balance-Leahy Scale: Fair     Standing balance support: Bilateral upper extremity supported, During functional activity Standing balance-Leahy Scale: Poor Standing balance comment: relies on RW dynamically, no UE support during ADLs                           ADL either performed or assessed with clinical judgement   ADL Overall ADL's : Needs assistance/impaired     Grooming: Supervision/safety;Standing;Oral care;Wash/dry face           Upper Body Dressing : Set up;Sitting   Lower Body Dressing: Supervision/safety;Sit to/from stand;Cueing for back precautions;Cueing for compensatory techniques   Toilet Transfer: Ambulation;Rolling walker (2 wheels);Cueing for safety;Supervision/safety   Toileting- Clothing Manipulation and Hygiene: Supervision/safety;Sit to/from stand       Functional mobility during ADLs: Supervision/safety;Rolling walker (2 wheels);Cueing for safety General ADL Comments: cueing for safety with RW mgmt and posture with transfers, min cueing for back precaution adherance.  Discussed recommendations with signficant other    Extremity/Trunk Assessment              Vision       Perception     Praxis      Cognition Arousal/Alertness: Awake/alert Behavior During Therapy: WFL for tasks  assessed/performed Overall Cognitive Status: Within Functional Limits for  tasks assessed                                 General Comments: able to recall back precautions, but min cueing to adhere functionally        Exercises      Shoulder Instructions       General Comments significant other present and supportive    Pertinent Vitals/ Pain       Pain Assessment Pain Assessment: Faces Faces Pain Scale: Hurts a little bit Pain Location: back-incisional Pain Descriptors / Indicators: Discomfort, Operative site guarding Pain Intervention(s): Limited activity within patient's tolerance, Monitored during session, Repositioned  Home Living                                          Prior Functioning/Environment              Frequency  Min 1X/week        Progress Toward Goals  OT Goals(current goals can now be found in the care plan section)  Progress towards OT goals: Progressing toward goals  Acute Rehab OT Goals Patient Stated Goal: home OT Goal Formulation: With patient Time For Goal Achievement: 01/27/23 Potential to Achieve Goals: Good  Plan Discharge plan remains appropriate;Frequency remains appropriate    Co-evaluation                 AM-PAC OT "6 Clicks" Daily Activity     Outcome Measure   Help from another person eating meals?: None Help from another person taking care of personal grooming?: A Little Help from another person toileting, which includes using toliet, bedpan, or urinal?: A Little Help from another person bathing (including washing, rinsing, drying)?: A Little Help from another person to put on and taking off regular upper body clothing?: A Little Help from another person to put on and taking off regular lower body clothing?: A Little 6 Click Score: 19    End of Session Equipment Utilized During Treatment: Rolling walker (2 wheels);Back brace  OT Visit Diagnosis: Other abnormalities of gait and mobility (R26.89);Pain Pain - part of body:  (backl)   Activity  Tolerance Patient tolerated treatment well   Patient Left with call bell/phone within reach;with family/visitor present;Other (comment) (seated EOB)   Nurse Communication Mobility status        Time: 1610-9604 OT Time Calculation (min): 27 min  Charges: OT General Charges $OT Visit: 1 Visit OT Treatments $Self Care/Home Management : 23-37 mins  Barry Brunner, OT Acute Rehabilitation Services Office [phone]   Chancy Milroy 01/14/2023, 10:16 AM

## 2023-01-14 NOTE — Progress Notes (Signed)
Physical Therapy Treatment  Patient Details Name: Caroline Williams MRN: [id number] DOB: 29-Mar-1961 Today's Date: 01/14/2023   History of Present Illness Pt is a 62 y/o female presenting 7/31 for planned L4-5 PLIF.  PMH includes: anxiety, arthritis, bipolar 1, CAD, COPD, depression, DM, HTN, hernia repair, R TKR.    PT Comments  Pt progressing with post-op mobility. She was able to demonstrate transfers and ambulation with gross min guard assist but requiring +2 assist for safe stair negotiation. Boyfriend present for education and was hands-on throughout stair training for practice. Reinforced education on precautions, brace application/wearing schedule, appropriate activity progression, and car transfer. Will continue to follow.      If plan is discharge home, recommend the following: A little help with walking and/or transfers;A little help with bathing/dressing/bathroom;Assistance with cooking/housework;Assist for transportation;Help with stairs or ramp for entrance   Can travel by private vehicle        Equipment Recommendations  Rolling walker (2 wheels);BSC/3in1    Recommendations for Other Services       Precautions / Restrictions Precautions Precautions: Back;Fall Precaution Booklet Issued: Yes (comment) Precaution Comments: Reviewed precautions verbally and pt was cued for maintenance of precautions during functional mobility and ADLs Required Braces or Orthoses: Spinal Brace Spinal Brace: Lumbar corset Restrictions Weight Bearing Restrictions: No     Mobility  Bed Mobility               General bed mobility comments: Pt was received sitting up EOB.    Transfers Overall transfer level: Needs assistance Equipment used: Rolling walker (2 wheels) Transfers: Sit to/from Stand Sit to Stand: Min guard           General transfer comment: Poor posture and adherence to precautions. VC's for hand placement on seated surface for safety and to minimize trunk flexion  and avoid twisting during power up to full stand.    Ambulation/Gait Ambulation/Gait assistance: Min guard Gait Distance (Feet): 40 Feet Assistive device: Rolling walker (2 wheels) Gait Pattern/deviations: Step-through pattern, Decreased stride length, Trunk flexed (Low floor clearance) Gait velocity: Decreased Gait velocity interpretation: <1.31 ft/sec, indicative of household ambulator   General Gait Details: VC's for improved posture, closer walker proximity, and forward gaze. No assist required for balance but pt moving very slowly to advance feet.   Stairs Stairs: Yes Stairs assistance: Max assist, +2 physical assistance Stair Management: Two rails, One rail Left, Step to pattern, Forwards, With cane (and HHA) Number of Stairs: 1 (x3 trials) General stair comments: Heavy assist required to negotiate 1 step at this time. Boyfriend present for education. Boyfriend on the L providing HHA and pt had SPC on the R. VC's throughout for sequencing and +2 assist for power up to next step.   Wheelchair Mobility     Tilt Bed    Modified Rankin (Stroke Patients Only)       Balance Overall balance assessment: Needs assistance Sitting-balance support: No upper extremity supported, Feet supported Sitting balance-Leahy Scale: Fair     Standing balance support: Bilateral upper extremity supported, During functional activity Standing balance-Leahy Scale: Poor Standing balance comment: relies on RW dynamically, no UE support during ADLs                            Cognition Arousal/Alertness: Awake/alert Behavior During Therapy: WFL for tasks assessed/performed Overall Cognitive Status: Within Functional Limits for tasks assessed  Exercises      General Comments General comments (skin integrity, edema, etc.): significant other present and supportive      Pertinent Vitals/Pain Pain Assessment Pain  Assessment: Faces Faces Pain Scale: Hurts a little bit Pain Location: back-incisional Pain Descriptors / Indicators: Discomfort, Operative site guarding Pain Intervention(s): Limited activity within patient's tolerance, Monitored during session, Repositioned    Home Living                          Prior Function            PT Goals (current goals can now be found in the care plan section) Acute Rehab PT Goals Patient Stated Goal: Home today PT Goal Formulation: With patient Time For Goal Achievement: 01/20/23 Potential to Achieve Goals: Good Progress towards PT goals: Progressing toward goals    Frequency    Min 1X/week      PT Plan Current plan remains appropriate    Co-evaluation              AM-PAC PT "6 Clicks" Mobility   Outcome Measure  Help needed turning from your back to your side while in a flat bed without using bedrails?: A Little Help needed moving from lying on your back to sitting on the side of a flat bed without using bedrails?: A Little Help needed moving to and from a bed to a chair (including a wheelchair)?: A Little Help needed standing up from a chair using your arms (e.g., wheelchair or bedside chair)?: A Little Help needed to walk in hospital room?: A Little Help needed climbing 3-5 steps with a railing? : A Lot 6 Click Score: 17    End of Session Equipment Utilized During Treatment: Gait belt;Back brace Activity Tolerance: Patient limited by pain;Patient limited by lethargy Patient left: in bed;with call bell/phone within reach Nurse Communication: Mobility status PT Visit Diagnosis: Unsteadiness on feet (R26.81);Pain Pain - part of body:  (back)     Time: 1610-9604 PT Time Calculation (min) (ACUTE ONLY): 23 min  Charges:    $Gait Training: 23-37 mins PT General Charges $$ ACUTE PT VISIT: 1 Visit                     Conni Slipper, PT, DPT Acute Rehabilitation Services Secure Chat Preferred Office: [phone]     Marylynn Pearson 01/14/2023, 1:43 PM

## 2023-01-14 NOTE — Progress Notes (Signed)
Patient alert and oriented, voiding adequately, skin clean, dry and intact without evidence of skin break down, or symptoms of complications - no redness or edema noted, only slight tenderness at site.  Patient states pain is manageable at time of discharge. Patient has an appointment with MD in 3 weeks 

## 2023-01-26 DIAGNOSIS — R6889 Other general symptoms and signs: Secondary | ICD-10-CM | POA: Diagnosis not present

## 2023-01-30 ENCOUNTER — Emergency Department (HOSPITAL_COMMUNITY): Payer: Medicare HMO

## 2023-01-30 ENCOUNTER — Inpatient Hospital Stay (HOSPITAL_COMMUNITY)
Admission: EM | Admit: 2023-01-30 | Discharge: 2023-02-08 | DRG: 393 | Disposition: A | Discharge status: Home Health | Payer: Medicare HMO | Attending: Internal Medicine | Admitting: Internal Medicine

## 2023-01-30 ENCOUNTER — Other Ambulatory Visit: Payer: Self-pay

## 2023-01-30 DIAGNOSIS — K633 Ulcer of intestine: Secondary | ICD-10-CM | POA: Diagnosis not present

## 2023-01-30 DIAGNOSIS — K9189 Other postprocedural complications and disorders of digestive system: Secondary | ICD-10-CM | POA: Diagnosis not present

## 2023-01-30 DIAGNOSIS — Z981 Arthrodesis status: Secondary | ICD-10-CM

## 2023-01-30 DIAGNOSIS — R410 Disorientation, unspecified: Secondary | ICD-10-CM | POA: Diagnosis not present

## 2023-01-30 DIAGNOSIS — E872 Acidosis, unspecified: Secondary | ICD-10-CM

## 2023-01-30 DIAGNOSIS — R918 Other nonspecific abnormal finding of lung field: Secondary | ICD-10-CM | POA: Diagnosis not present

## 2023-01-30 DIAGNOSIS — F319 Bipolar disorder, unspecified: Secondary | ICD-10-CM | POA: Diagnosis present

## 2023-01-30 DIAGNOSIS — N3000 Acute cystitis without hematuria: Secondary | ICD-10-CM

## 2023-01-30 DIAGNOSIS — D631 Anemia in chronic kidney disease: Secondary | ICD-10-CM | POA: Diagnosis present

## 2023-01-30 DIAGNOSIS — E7800 Pure hypercholesterolemia, unspecified: Secondary | ICD-10-CM | POA: Diagnosis present

## 2023-01-30 DIAGNOSIS — F909 Attention-deficit hyperactivity disorder, unspecified type: Secondary | ICD-10-CM | POA: Diagnosis present

## 2023-01-30 DIAGNOSIS — Z90710 Acquired absence of both cervix and uterus: Secondary | ICD-10-CM | POA: Diagnosis not present

## 2023-01-30 DIAGNOSIS — K6389 Other specified diseases of intestine: Secondary | ICD-10-CM | POA: Diagnosis not present

## 2023-01-30 DIAGNOSIS — I10 Essential (primary) hypertension: Secondary | ICD-10-CM | POA: Diagnosis present

## 2023-01-30 DIAGNOSIS — N183 Chronic kidney disease, stage 3 unspecified: Secondary | ICD-10-CM | POA: Diagnosis not present

## 2023-01-30 DIAGNOSIS — E669 Obesity, unspecified: Secondary | ICD-10-CM | POA: Diagnosis present

## 2023-01-30 DIAGNOSIS — Z9884 Bariatric surgery status: Secondary | ICD-10-CM

## 2023-01-30 DIAGNOSIS — R578 Other shock: Secondary | ICD-10-CM

## 2023-01-30 DIAGNOSIS — F419 Anxiety disorder, unspecified: Secondary | ICD-10-CM | POA: Diagnosis present

## 2023-01-30 DIAGNOSIS — K5150 Left sided colitis without complications: Secondary | ICD-10-CM | POA: Diagnosis not present

## 2023-01-30 DIAGNOSIS — Z5941 Food insecurity: Secondary | ICD-10-CM

## 2023-01-30 DIAGNOSIS — K7689 Other specified diseases of liver: Secondary | ICD-10-CM | POA: Diagnosis not present

## 2023-01-30 DIAGNOSIS — G4733 Obstructive sleep apnea (adult) (pediatric): Secondary | ICD-10-CM | POA: Diagnosis present

## 2023-01-30 DIAGNOSIS — Z91030 Bee allergy status: Secondary | ICD-10-CM

## 2023-01-30 DIAGNOSIS — Z79891 Long term (current) use of opiate analgesic: Secondary | ICD-10-CM

## 2023-01-30 DIAGNOSIS — K449 Diaphragmatic hernia without obstruction or gangrene: Secondary | ICD-10-CM | POA: Diagnosis not present

## 2023-01-30 DIAGNOSIS — K922 Gastrointestinal hemorrhage, unspecified: Secondary | ICD-10-CM | POA: Diagnosis not present

## 2023-01-30 DIAGNOSIS — D62 Acute posthemorrhagic anemia: Secondary | ICD-10-CM

## 2023-01-30 DIAGNOSIS — R4182 Altered mental status, unspecified: Secondary | ICD-10-CM | POA: Diagnosis not present

## 2023-01-30 DIAGNOSIS — E1122 Type 2 diabetes mellitus with diabetic chronic kidney disease: Secondary | ICD-10-CM | POA: Diagnosis present

## 2023-01-30 DIAGNOSIS — G2581 Restless legs syndrome: Secondary | ICD-10-CM | POA: Diagnosis present

## 2023-01-30 DIAGNOSIS — G8929 Other chronic pain: Secondary | ICD-10-CM | POA: Diagnosis not present

## 2023-01-30 DIAGNOSIS — Z833 Family history of diabetes mellitus: Secondary | ICD-10-CM

## 2023-01-30 DIAGNOSIS — E039 Hypothyroidism, unspecified: Secondary | ICD-10-CM | POA: Diagnosis present

## 2023-01-30 DIAGNOSIS — M549 Dorsalgia, unspecified: Secondary | ICD-10-CM | POA: Diagnosis present

## 2023-01-30 DIAGNOSIS — E1165 Type 2 diabetes mellitus with hyperglycemia: Secondary | ICD-10-CM | POA: Diagnosis present

## 2023-01-30 DIAGNOSIS — E559 Vitamin D deficiency, unspecified: Secondary | ICD-10-CM | POA: Diagnosis present

## 2023-01-30 DIAGNOSIS — K921 Melena: Secondary | ICD-10-CM | POA: Diagnosis not present

## 2023-01-30 DIAGNOSIS — Z7951 Long term (current) use of inhaled steroids: Secondary | ICD-10-CM

## 2023-01-30 DIAGNOSIS — N390 Urinary tract infection, site not specified: Secondary | ICD-10-CM | POA: Diagnosis present

## 2023-01-30 DIAGNOSIS — I2510 Atherosclerotic heart disease of native coronary artery without angina pectoris: Secondary | ICD-10-CM | POA: Diagnosis present

## 2023-01-30 DIAGNOSIS — K55039 Acute (reversible) ischemia of large intestine, extent unspecified: Secondary | ICD-10-CM | POA: Diagnosis not present

## 2023-01-30 DIAGNOSIS — R404 Transient alteration of awareness: Secondary | ICD-10-CM | POA: Diagnosis not present

## 2023-01-30 DIAGNOSIS — J4489 Other specified chronic obstructive pulmonary disease: Secondary | ICD-10-CM | POA: Diagnosis present

## 2023-01-30 DIAGNOSIS — K559 Vascular disorder of intestine, unspecified: Secondary | ICD-10-CM | POA: Diagnosis not present

## 2023-01-30 DIAGNOSIS — Z96651 Presence of right artificial knee joint: Secondary | ICD-10-CM | POA: Diagnosis present

## 2023-01-30 DIAGNOSIS — E1140 Type 2 diabetes mellitus with diabetic neuropathy, unspecified: Secondary | ICD-10-CM | POA: Diagnosis not present

## 2023-01-30 DIAGNOSIS — G9341 Metabolic encephalopathy: Secondary | ICD-10-CM

## 2023-01-30 DIAGNOSIS — Z882 Allergy status to sulfonamides status: Secondary | ICD-10-CM

## 2023-01-30 DIAGNOSIS — Z87891 Personal history of nicotine dependence: Secondary | ICD-10-CM | POA: Diagnosis not present

## 2023-01-30 DIAGNOSIS — I517 Cardiomegaly: Secondary | ICD-10-CM | POA: Diagnosis not present

## 2023-01-30 DIAGNOSIS — E876 Hypokalemia: Secondary | ICD-10-CM

## 2023-01-30 DIAGNOSIS — Z888 Allergy status to other drugs, medicaments and biological substances status: Secondary | ICD-10-CM

## 2023-01-30 DIAGNOSIS — N179 Acute kidney failure, unspecified: Secondary | ICD-10-CM | POA: Diagnosis present

## 2023-01-30 DIAGNOSIS — N1832 Chronic kidney disease, stage 3b: Secondary | ICD-10-CM | POA: Diagnosis present

## 2023-01-30 DIAGNOSIS — J449 Chronic obstructive pulmonary disease, unspecified: Secondary | ICD-10-CM | POA: Diagnosis not present

## 2023-01-30 DIAGNOSIS — Z6839 Body mass index (BMI) 39.0-39.9, adult: Secondary | ICD-10-CM | POA: Diagnosis not present

## 2023-01-30 DIAGNOSIS — I129 Hypertensive chronic kidney disease with stage 1 through stage 4 chronic kidney disease, or unspecified chronic kidney disease: Secondary | ICD-10-CM | POA: Diagnosis present

## 2023-01-30 DIAGNOSIS — Z5982 Transportation insecurity: Secondary | ICD-10-CM

## 2023-01-30 DIAGNOSIS — E1142 Type 2 diabetes mellitus with diabetic polyneuropathy: Secondary | ICD-10-CM | POA: Diagnosis present

## 2023-01-30 DIAGNOSIS — R197 Diarrhea, unspecified: Secondary | ICD-10-CM | POA: Diagnosis not present

## 2023-01-30 DIAGNOSIS — Z716 Tobacco abuse counseling: Secondary | ICD-10-CM

## 2023-01-30 DIAGNOSIS — E278 Other specified disorders of adrenal gland: Secondary | ICD-10-CM | POA: Diagnosis not present

## 2023-01-30 DIAGNOSIS — R5381 Other malaise: Secondary | ICD-10-CM | POA: Diagnosis present

## 2023-01-30 DIAGNOSIS — Z5986 Financial insecurity: Secondary | ICD-10-CM

## 2023-01-30 DIAGNOSIS — F17210 Nicotine dependence, cigarettes, uncomplicated: Secondary | ICD-10-CM | POA: Diagnosis present

## 2023-01-30 DIAGNOSIS — E038 Other specified hypothyroidism: Secondary | ICD-10-CM | POA: Diagnosis not present

## 2023-01-30 DIAGNOSIS — Z91199 Patient's noncompliance with other medical treatment and regimen due to unspecified reason: Secondary | ICD-10-CM

## 2023-01-30 DIAGNOSIS — R188 Other ascites: Secondary | ICD-10-CM | POA: Diagnosis not present

## 2023-01-30 DIAGNOSIS — R0902 Hypoxemia: Secondary | ICD-10-CM | POA: Diagnosis not present

## 2023-01-30 DIAGNOSIS — D649 Anemia, unspecified: Secondary | ICD-10-CM | POA: Diagnosis not present

## 2023-01-30 DIAGNOSIS — K640 First degree hemorrhoids: Secondary | ICD-10-CM | POA: Diagnosis present

## 2023-01-30 DIAGNOSIS — Z79899 Other long term (current) drug therapy: Secondary | ICD-10-CM

## 2023-01-30 DIAGNOSIS — K55032 Diffuse acute (reversible) ischemia of large intestine: Secondary | ICD-10-CM | POA: Diagnosis present

## 2023-01-30 DIAGNOSIS — R9431 Abnormal electrocardiogram [ECG] [EKG]: Secondary | ICD-10-CM | POA: Diagnosis not present

## 2023-01-30 DIAGNOSIS — E785 Hyperlipidemia, unspecified: Secondary | ICD-10-CM | POA: Diagnosis present

## 2023-01-30 DIAGNOSIS — I700 Atherosclerosis of aorta: Secondary | ICD-10-CM | POA: Diagnosis not present

## 2023-01-30 DIAGNOSIS — Z8249 Family history of ischemic heart disease and other diseases of the circulatory system: Secondary | ICD-10-CM

## 2023-01-30 DIAGNOSIS — K56609 Unspecified intestinal obstruction, unspecified as to partial versus complete obstruction: Secondary | ICD-10-CM | POA: Diagnosis not present

## 2023-01-30 DIAGNOSIS — Q600 Renal agenesis, unilateral: Secondary | ICD-10-CM | POA: Diagnosis not present

## 2023-01-30 DIAGNOSIS — I959 Hypotension, unspecified: Secondary | ICD-10-CM | POA: Diagnosis not present

## 2023-01-30 DIAGNOSIS — Z6837 Body mass index (BMI) 37.0-37.9, adult: Secondary | ICD-10-CM

## 2023-01-30 DIAGNOSIS — I25119 Atherosclerotic heart disease of native coronary artery with unspecified angina pectoris: Secondary | ICD-10-CM

## 2023-01-30 DIAGNOSIS — Z886 Allergy status to analgesic agent status: Secondary | ICD-10-CM

## 2023-01-30 DIAGNOSIS — Z79890 Hormone replacement therapy (postmenopausal): Secondary | ICD-10-CM

## 2023-01-30 DIAGNOSIS — K529 Noninfective gastroenteritis and colitis, unspecified: Secondary | ICD-10-CM | POA: Diagnosis not present

## 2023-01-30 DIAGNOSIS — R109 Unspecified abdominal pain: Secondary | ICD-10-CM | POA: Diagnosis not present

## 2023-01-30 LAB — I-STAT CG4 LACTIC ACID, ED
Lactic Acid, Venous: 14 mmol/L (ref 0.5–1.9)
Lactic Acid, Venous: 11.2 mmol/L (ref 0.5–1.9)

## 2023-01-30 LAB — CBC WITH DIFFERENTIAL/PLATELET
Abs Immature Granulocytes: 0.12 10*3/uL — ABNORMAL HIGH (ref 0.00–0.07)
Basophils Absolute: 0 10*3/uL (ref 0.0–0.1)
Basophils Relative: 0 %
Eosinophils Absolute: 0 10*3/uL (ref 0.0–0.5)
Eosinophils Relative: 0 %
HCT: 10 % — ABNORMAL LOW (ref 36.0–46.0)
Hemoglobin: 3 g/dL — CL (ref 12.0–15.0)
Immature Granulocytes: 1 %
Lymphocytes Relative: 9 %
Lymphs Abs: 1.3 10*3/uL (ref 0.7–4.0)
MCH: 29.7 pg (ref 26.0–34.0)
MCHC: 30 g/dL (ref 30.0–36.0)
MCV: 99 fL (ref 80.0–100.0)
Monocytes Absolute: 1 10*3/uL (ref 0.1–1.0)
Monocytes Relative: 7 %
Neutro Abs: 11.4 10*3/uL — ABNORMAL HIGH (ref 1.7–7.7)
Neutrophils Relative %: 83 %
Platelets: 350 10*3/uL (ref 150–400)
RBC: 1.01 MIL/uL — ABNORMAL LOW (ref 3.87–5.11)
RDW: 17.9 % — ABNORMAL HIGH (ref 11.5–15.5)
WBC: 13.8 10*3/uL — ABNORMAL HIGH (ref 4.0–10.5)
nRBC: 1.8 % — ABNORMAL HIGH (ref 0.0–0.2)

## 2023-01-30 LAB — CBC
HCT: 10.5 % — ABNORMAL LOW (ref 36.0–46.0)
Hemoglobin: 3 g/dL — CL (ref 12.0–15.0)
MCH: 27.5 pg (ref 26.0–34.0)
MCHC: 28.6 g/dL — ABNORMAL LOW (ref 30.0–36.0)
MCV: 96.3 fL (ref 80.0–100.0)
Platelets: 375 10*3/uL (ref 150–400)
RBC: 1.09 MIL/uL — ABNORMAL LOW (ref 3.87–5.11)
RDW: 17.6 % — ABNORMAL HIGH (ref 11.5–15.5)
WBC: 13.9 10*3/uL — ABNORMAL HIGH (ref 4.0–10.5)
nRBC: 1.9 % — ABNORMAL HIGH (ref 0.0–0.2)

## 2023-01-30 LAB — BASIC METABOLIC PANEL
Anion gap: 27 — ABNORMAL HIGH (ref 5–15)
BUN: 68 mg/dL — ABNORMAL HIGH (ref 8–23)
CO2: 8 mmol/L — ABNORMAL LOW (ref 22–32)
Calcium: 7.8 mg/dL — ABNORMAL LOW (ref 8.9–10.3)
Chloride: 98 mmol/L (ref 98–111)
Creatinine, Ser: 2.61 mg/dL — ABNORMAL HIGH (ref 0.44–1.00)
GFR, Estimated: 20 mL/min — ABNORMAL LOW (ref >60)
Glucose, Bld: 140 mg/dL — ABNORMAL HIGH (ref 70–99)
Potassium: 4.4 mmol/L (ref 3.5–5.1)
Sodium: 133 mmol/L — ABNORMAL LOW (ref 135–145)

## 2023-01-30 LAB — URINALYSIS, ROUTINE W REFLEX MICROSCOPIC
Bilirubin Urine: NEGATIVE
Glucose, UA: NEGATIVE mg/dL
Ketones, ur: NEGATIVE mg/dL
Leukocytes,Ua: NEGATIVE
Nitrite: NEGATIVE
Protein, ur: 30 mg/dL — AB
Specific Gravity, Urine: 1.019 (ref 1.005–1.030)
pH: 5 (ref 5.0–8.0)

## 2023-01-30 LAB — LIPASE, BLOOD: Lipase: 73 U/L — ABNORMAL HIGH (ref 11–51)

## 2023-01-30 LAB — RAPID URINE DRUG SCREEN, HOSP PERFORMED
Amphetamines: POSITIVE — AB
Barbiturates: NOT DETECTED
Benzodiazepines: NOT DETECTED
Cocaine: NOT DETECTED
Opiates: POSITIVE — AB
Tetrahydrocannabinol: NOT DETECTED

## 2023-01-30 LAB — ABO/RH: ABO/RH(D): AB POS

## 2023-01-30 LAB — CBG MONITORING, ED: Glucose-Capillary: 121 mg/dL — ABNORMAL HIGH (ref 70–99)

## 2023-01-30 LAB — HEPATIC FUNCTION PANEL
ALT: 29 U/L (ref 0–44)
AST: 52 U/L — ABNORMAL HIGH (ref 15–41)
Albumin: 2.9 g/dL — ABNORMAL LOW (ref 3.5–5.0)
Alkaline Phosphatase: 72 U/L (ref 38–126)
Bilirubin, Direct: <0.1 mg/dL (ref 0.0–0.2)
Total Bilirubin: 0.7 mg/dL (ref 0.3–1.2)
Total Protein: 6 g/dL — ABNORMAL LOW (ref 6.5–8.1)

## 2023-01-30 LAB — OCCULT BLOOD X 1 CARD TO LAB, STOOL: Fecal Occult Bld: POSITIVE — AB

## 2023-01-30 LAB — SALICYLATE LEVEL: Salicylate Lvl: <7 mg/dL — ABNORMAL LOW (ref 7.0–30.0)

## 2023-01-30 LAB — PREPARE RBC (CROSSMATCH)

## 2023-01-30 LAB — ACETAMINOPHEN LEVEL: Acetaminophen (Tylenol), Serum: <10 ug/mL — ABNORMAL LOW (ref 10–30)

## 2023-01-30 LAB — ETHANOL: Alcohol, Ethyl (B): <10 mg/dL (ref <10)

## 2023-01-30 MED ORDER — SODIUM CHLORIDE 0.9% IV SOLUTION: Freq: Once | INTRAVENOUS | Status: AC

## 2023-01-30 MED ORDER — PANTOPRAZOLE SODIUM 40 MG IV SOLR
40.0000 mg | Freq: Once | INTRAVENOUS | Status: AC
  Administered 2023-01-30: 40 mg via INTRAVENOUS
  Filled 2023-01-30: qty 10

## 2023-01-30 MED ORDER — IOHEXOL 350 MG/ML SOLN
75.0000 mL | Freq: Once | INTRAVENOUS | Status: AC | PRN
  Administered 2023-01-30: 75 mL via INTRAVENOUS

## 2023-01-30 MED ORDER — SODIUM CHLORIDE 0.9 % IV SOLN: 1.0000 g | Freq: Once | INTRAVENOUS | Status: DC

## 2023-01-30 MED ORDER — INSULIN ASPART 100 UNIT/ML IJ SOLN
0.0000 [IU] | INTRAMUSCULAR | Status: DC
  Administered 2023-01-31 – 2023-02-05 (×7): 1 [IU] via SUBCUTANEOUS
  Administered 2023-02-08: 2 [IU] via SUBCUTANEOUS

## 2023-01-30 MED ORDER — PANTOPRAZOLE 80MG IVPB - SIMPLE MED
80.0000 mg | Freq: Once | INTRAVENOUS | Status: DC
Start: 2023-01-31 — End: 2023-01-30

## 2023-01-30 MED ORDER — LACTATED RINGERS IV BOLUS
1000.0000 mL | Freq: Once | INTRAVENOUS | Status: AC
  Administered 2023-01-30: 1000 mL via INTRAVENOUS

## 2023-01-30 MED ORDER — LORAZEPAM 2 MG/ML IJ SOLN
1.0000 mg | INTRAMUSCULAR | Status: DC | PRN
  Administered 2023-01-30: 1 mg via INTRAVENOUS
  Filled 2023-01-30: qty 1

## 2023-01-30 MED ORDER — PANTOPRAZOLE INFUSION (NEW) - SIMPLE MED
8.0000 mg/h | INTRAVENOUS | Status: DC
  Administered 2023-01-31: 8 mg/h via INTRAVENOUS
  Filled 2023-01-30 (×2): qty 100

## 2023-01-30 MED ORDER — SODIUM CHLORIDE 0.9 % IV SOLN
1.0000 g | INTRAVENOUS | Status: AC
  Administered 2023-01-31 – 2023-02-02 (×3): 1 g via INTRAVENOUS
  Filled 2023-01-30 (×4): qty 10

## 2023-01-30 MED ORDER — PANTOPRAZOLE SODIUM 40 MG IV SOLR: 40.0000 mg | Freq: Two times a day (BID) | INTRAVENOUS | Status: DC

## 2023-01-30 NOTE — ED Notes (Signed)
PATIENT NOTED HAVING RECTAL BLEEDING, DARK RED BLOOD. MILD BLEEDING, CLEANED PT AT THIS TIME. CHANGED CHUX PAD AND PROVIDED WITH NEW BRIEF. PT AWAKENS ON VERBAL STIMULI. REMAINS ON THE MONITOR.

## 2023-01-30 NOTE — Assessment & Plan Note (Signed)
Follow-up as an outpatient once stable

## 2023-01-30 NOTE — Assessment & Plan Note (Signed)
-   Check TSH continue home medications Synthroid at  150 mcg po q day

## 2023-01-30 NOTE — H&P (Addendum)
Caroline Williams ZOX:[id number] DOB: [birth year] DOA: 01/30/2023     PCP: Hoy Register, MD   Outpatient Specialists:  CARDS:  Dr. Orbie Pyo, MD   Neurosurgery Dr. Lovell Sheehan    GI Dr. Bosie Clos [facility] )     Patient arrived to ER on 01/30/23 at 1808 Referred by Attending Glyn Ade, MD   Patient coming from:    home Lives  With SO    Chief Complaint:   Chief Complaint  Patient presents with   Seizures   Altered Mental Status    HPI: Caroline Williams is a 62 y.o. female with medical history significant of CAD and anxiety asthma bipolar disorder COPD DM2 hypertension hypothyroidism    Presented with melena Presents with left-sided abdominal pain nausea vomiting diarrhea also have been having nonbloody vomiting but now having large amount of black stools 2019 she had a benign polyp no fevers no chills no hematemesis not on any blood thinners does not drink alcohol    States she has been vomiting blood since 4 days ago Has been feelinge xtrra sleepy Describes CP reproducible by palpation and SOB Reports black stools for the past 4 days  She is not on aspirin Only takes oxycodone   Denies significant ETOH intake   Does  smoke a bit  Lab Results  Component Value Date   SARSCOV2NAA Not Detected 07/12/2019   SARSCOV2NAA Not Detected 01/30/2019       Regarding pertinent Chronic problems:       HTN on Imdur, Toprol, olmesartan/hydrochlorothiazide/amlodipine, spironolactone,     chronic CHF diastolic/systolic/ combined - last echo  Recent Results (from the past 81191 hour(s))  ECHOCARDIOGRAM COMPLETE   Collection Time: 12/28/22  8:14 AM  Result Value   Weight 3,552   Height 65   Area-P 1/2 3.13   S' Lateral 3.30   Radius 0.60   MV M vel 3.75   MV Peak grad 56.3   Est EF 50 - 55%   Narrative      ECHOCARDIOGRAM REPORT         1. Left ventricular ejection fraction, by estimation, is 50 to 55%. Left ventricular ejection fraction by 3D volume is 51  %. The left ventricle has low normal function. The left ventricle demonstrates global hypokinesis. There is moderate concentric  left ventricular hypertrophy. Left ventricular diastolic parameters are indeterminate. The average left ventricular global longitudinal strain is -16.1 %. The global longitudinal strain is normal.  2. Right ventricular systolic function is normal. The right ventricular size is normal. There is normal pulmonary artery systolic pressure. The estimated right ventricular systolic pressure is 18.4 mmHg.  3. The mitral valve is normal in structure. Mild to moderate mitral valve regurgitation. No evidence of mitral stenosis.  4. The aortic valve is tricuspid. Aortic valve regurgitation is not visualized. Aortic valve sclerosis is present, with no evidence of aortic valve stenosis.  5. The inferior vena cava is normal in size with greater than 50% respiratory variability, suggesting right atrial pressure of 3 mmHg.              CAD  - On Aspirin, statin, betablocker, Plavix                 - followed by cardiology                - last cardiac cath        DM 2 -  Lab Results  Component Value Date  HGBA1C 6.0 (H) 01/11/2023   on Farxiga   Hypothyroidism:   Lab Results  Component Value Date   TSH 91.900 (H) 10/07/2022   T3TOTAL <20 (L) 10/07/2022   T4TOTAL 5.2 01/11/2020   on synthroid    Morbid obesity-   BMI Readings from Last 1 Encounters:  01/12/23 39.40 kg/m     COPD - not  followed by pulmonology    on baseline oxygen  2L,  at night    OSA -  noncompliant with CPAP     CKD stage IIIa-   baseline Cr  1.4 CrCl cannot be calculated (Unknown ideal weight.).  Lab Results  Component Value Date   CREATININE 2.61 (H) 01/30/2023   CREATININE 1.39 (H) 01/11/2023   CREATININE 1.38 (H) 10/07/2022   Lab Results  Component Value Date   NA 133 (L) 01/30/2023   CL 98 01/30/2023   K 4.4 01/30/2023   CO2 8 (L) 01/30/2023   BUN 68 (H) 01/30/2023    CREATININE 2.61 (H) 01/30/2023   GFRNONAA 20 (L) 01/30/2023   CALCIUM 7.8 (L) 01/30/2023   ALBUMIN 2.9 (L) 01/30/2023   GLUCOSE 140 (H) 01/30/2023      Chronic anemia - baseline hg Hemoglobin & Hematocrit  Recent Labs    01/11/23 0918 01/30/23 1858 01/30/23 1954  HGB 11.0* 3.0* 3.0*   Iron/TIBC/Ferritin/ %Sat No results found for: "IRON", "TIBC", "FERRITIN", "IRONPCTSAT"      While in ER: Clinical Course as of 01/30/23 2337  Sun Jan 30, 2023  2003 Multiple people in the room for discussion about blood product receipt.  Will recheck hemoglobin first however if below 7 patient is willing to receive blood products as a lifesaving measure.  3 days of bloody diarrhea is the likely etiology. [CC]    Clinical Course User Index [CC] Glyn Ade, MD       Lab Orders         Blood culture (routine x 2)         CBC         Basic metabolic panel         Ethanol         Urine rapid drug screen (hosp performed)         Hepatic function panel         Salicylate level         Acetaminophen level         Urinalysis, Routine w reflex microscopic -Urine, Clean Catch         Lipase, blood         CBC with Differential         Occult blood card to lab, stool         DIC Panel ONCE - STAT         CBG monitoring, ED         I-Stat CG4 Lactic Acid         I-Stat venous blood gas, ([facility] ED, [facility], [facility])         I-stat chem 8, ED (not at [facility], [facility] or [facility])         POC occult blood, ED     CXR - Low volume AP portable examination. Cardiomegaly without acute abnormality of the lungs  CTabd/pelvis - Scattered aortoiliac atherosclerosis. No significant stenosis, aneurysm or dissection.   Difficult to assess for GI bleed within the colon due to ingested high-density material throughout the colon. No obvious or large GI bleed visualized.  Following Medications were ordered in ER: Medications  LORazepam (ATIVAN) injection 1 mg (1 mg Intravenous Given 01/30/23 2012)  0.9 %  sodium chloride  infusion (Manually program via Guardrails IV Fluids) (has no administration in time range)  pantoprazole (PROTONIX) injection 40 mg (40 mg Intravenous Given 01/30/23 2014)  lactated ringers bolus 1,000 mL (0 mLs Intravenous Stopped 01/30/23 2317)  0.9 %  sodium chloride infusion (Manually program via Guardrails IV Fluids) (0 mLs Intravenous Stopped 01/30/23 2302)  pantoprazole (PROTONIX) injection 40 mg (40 mg Intravenous Given 01/30/23 2242)  iohexol (OMNIPAQUE) 350 MG/ML injection 75 mL (75 mLs Intravenous Contrast Given 01/30/23 2208)    _______________________________________________________ ER Provider Called:   PCCM     Dr.Hunsucker They Recommend admit to medicine    SEEN in ER     Dr.  Dulce Sellar They Recommend admit to medicine   Will see in AM       ED Triage Vitals [01/30/23 1813]  Encounter Vitals Group     BP (!) 111/54     Systolic BP Percentile      Diastolic BP Percentile      Pulse Rate 92     Resp (!) 22     Temp (!) 97.4 F (36.3 C)     Temp Source Oral     SpO2 100 %     Weight      Height      Head Circumference      Peak Flow      Pain Score      Pain Loc      Pain Education      Exclude from Growth Chart   UEAV(40)@     _________________________________________ Significant initial  Findings: Abnormal Labs Reviewed  CBC - Abnormal; Notable for the following components:      Result Value   WBC 13.9 (*)    RBC 1.09 (*)    Hemoglobin 3.0 (*)    HCT 10.5 (*)    MCHC 28.6 (*)    RDW 17.6 (*)    nRBC 1.9 (*)    All other components within normal limits  BASIC METABOLIC PANEL - Abnormal; Notable for the following components:   Sodium 133 (*)    CO2 8 (*)    Glucose, Bld 140 (*)    BUN 68 (*)    Creatinine, Ser 2.61 (*)    Calcium 7.8 (*)    GFR, Estimated 20 (*)    Anion gap 27 (*)    All other components within normal limits  RAPID URINE DRUG SCREEN, HOSP PERFORMED - Abnormal; Notable for the following components:   Opiates POSITIVE (*)     Amphetamines POSITIVE (*)    All other components within normal limits  HEPATIC FUNCTION PANEL - Abnormal; Notable for the following components:   Total Protein 6.0 (*)    Albumin 2.9 (*)    AST 52 (*)    All other components within normal limits  SALICYLATE LEVEL - Abnormal; Notable for the following components:   Salicylate Lvl <7.0 (*)    All other components within normal limits  ACETAMINOPHEN LEVEL - Abnormal; Notable for the following components:   Acetaminophen (Tylenol), Serum <10 (*)    All other components within normal limits  URINALYSIS, ROUTINE W REFLEX MICROSCOPIC - Abnormal; Notable for the following components:   APPearance HAZY (*)    Hgb urine dipstick MODERATE (*)    Protein, ur 30 (*)    Bacteria, UA MANY (*)  All other components within normal limits  LIPASE, BLOOD - Abnormal; Notable for the following components:   Lipase 73 (*)    All other components within normal limits  CBC WITH DIFFERENTIAL/PLATELET - Abnormal; Notable for the following components:   WBC 13.8 (*)    RBC 1.01 (*)    Hemoglobin 3.0 (*)    HCT 10.0 (*)    RDW 17.9 (*)    nRBC 1.8 (*)    Neutro Abs 11.4 (*)    Abs Immature Granulocytes 0.12 (*)    All other components within normal limits  OCCULT BLOOD X 1 CARD TO LAB, STOOL - Abnormal; Notable for the following components:   Fecal Occult Bld POSITIVE (*)    All other components within normal limits  CBG MONITORING, ED - Abnormal; Notable for the following components:   Glucose-Capillary 121 (*)    All other components within normal limits  I-STAT CG4 LACTIC ACID, ED - Abnormal; Notable for the following components:   Lactic Acid, Venous 14.0 (*)    All other components within normal limits  I-STAT CG4 LACTIC ACID, ED - Abnormal; Notable for the following components:   Lactic Acid, Venous 11.2 (*)    All other components within normal limits        Cardiac Panel (last 3 results) No results for input(s): "CKTOTAL", "CKMB",  "TROPONINIHS", "RELINDX" in the last 72 hours.   ECG: Ordered Personally reviewed and interpreted by me showing: HR : 87  Rhythm:Sinus rhythm Multiple ventricular premature complexes Nonspecific intraventricular conduction delay Nonspecific T abnormalities, lateral leads QTC 502  BNP (last 3 results) No results for input(s): "BNP" in the last 8760 hours.   COVID-19 Labs  Recent Labs    01/30/23 2305  DDIMER PENDING    Lab Results  Component Value Date   SARSCOV2NAA Not Detected 07/12/2019   SARSCOV2NAA Not Detected 01/30/2019    The recent clinical data is shown below. Vitals:   01/30/23 2302 01/30/23 2310 01/30/23 2315 01/30/23 2320  BP: 125/69 128/75 129/74 128/61  Pulse: 99 100 99 100  Resp: (!) 22 (!) 25 (!) 23 20  Temp:      TempSrc:      SpO2: 100% 99% 100% 100%     WBC     Component Value Date/Time   WBC 13.8 (H) 01/30/2023 1954   LYMPHSABS 1.3 01/30/2023 1954   LYMPHSABS 1.2 05/21/2021 0846   MONOABS 1.0 01/30/2023 1954   EOSABS 0.0 01/30/2023 1954   EOSABS 0.1 05/21/2021 0846   BASOSABS 0.0 01/30/2023 1954   BASOSABS 0.0 05/21/2021 0846  Lactic Acid, Venous    Component Value Date/Time   LATICACIDVEN 11.2 (HH) 01/30/2023 2253    Procalcitonin   Ordered      UA   evidence of UTI     Urine analysis:    Component Value Date/Time   COLORURINE YELLOW 01/30/2023 2305   APPEARANCEUR HAZY (A) 01/30/2023 2305   APPEARANCEUR Clear 12/06/2019 1632   LABSPEC 1.019 01/30/2023 2305   PHURINE 5.0 01/30/2023 2305   GLUCOSEU NEGATIVE 01/30/2023 2305   HGBUR MODERATE (A) 01/30/2023 2305   BILIRUBINUR NEGATIVE 01/30/2023 2305   BILIRUBINUR Negative 12/06/2019 1632   KETONESUR NEGATIVE 01/30/2023 2305   PROTEINUR 30 (A) 01/30/2023 2305   UROBILINOGEN 0.2 05/12/2020 0855   NITRITE NEGATIVE 01/30/2023 2305   LEUKOCYTESUR NEGATIVE 01/30/2023 2305    Results for orders placed or performed during the hospital encounter of 01/11/23  Surgical pcr  screen  Status: None   Collection Time: 01/11/23  9:17 AM   Specimen: Nasal Mucosa; Nasal Swab  Result Value Ref Range Status   MRSA, PCR NEGATIVE NEGATIVE Final   Staphylococcus aureus NEGATIVE NEGATIVE Final    Comment: (NOTE) The Xpert SA Assay (FDA approved for NASAL specimens in patients 69 years of age and older), is one component of a comprehensive surveillance program. It is not intended to diagnose infection nor to guide or monitor treatment. Performed at [facility], [address] [place], [place] [address]     ABX started Rocephin ________________________________________________________________  Venous  Blood Gas ordered   __________________________________________________________ Recent Labs  Lab 01/30/23 1858  NA 133*  K 4.4  CO2 8*  GLUCOSE 140*  BUN 68*  CREATININE 2.61*  CALCIUM 7.8*    Cr Up from baseline see below Lab Results  Component Value Date   CREATININE 2.61 (H) 01/30/2023   CREATININE 1.39 (H) 01/11/2023   CREATININE 1.38 (H) 10/07/2022    Recent Labs  Lab 01/30/23 1933  AST 52*  ALT 29  ALKPHOS 72  BILITOT 0.7  PROT 6.0*  ALBUMIN 2.9*   Lab Results  Component Value Date   CALCIUM 7.8 (L) 01/30/2023    Plt: Lab Results  Component Value Date   PLT 259 01/30/2023      Recent Labs  Lab 01/30/23 1858 01/30/23 1954 01/30/23 2305  WBC 13.9* 13.8*  --   NEUTROABS  --  11.4*  --   HGB 3.0* 3.0*  --   HCT 10.5* 10.0*  --   MCV 96.3 99.0  --   PLT 375 350 259    HG/HCT  Down  from baseline see below    Component Value Date/Time   HGB 3.0 (LL) 01/30/2023 1954   HGB 13.1 05/21/2021 0846   HCT 10.0 (L) 01/30/2023 1954   HCT 40.9 05/21/2021 0846   MCV 99.0 01/30/2023 1954   MCV 83 05/21/2021 0846     Recent Labs  Lab 01/30/23 1933  LIPASE 73*     _______________________________________________ Hospitalist was called for admission for upper gi blood loss,  Acute blood loss anemia  Lactic acidosis   Acute kidney injury    The following Work up has been ordered so far:  Orders Placed This Encounter  Procedures   Critical Care   Blood culture (routine x 2)   DG Chest Port 1 View   CT ANGIO GI BLEED   CBC   Basic metabolic panel   Ethanol   Urine rapid drug screen (hosp performed)   Hepatic function panel   Salicylate level   Acetaminophen level   Urinalysis, Routine w reflex microscopic -Urine, Clean Catch   Lipase, blood   CBC with Differential   Occult blood card to lab, stool   DIC Panel ONCE - STAT   Diet NPO time specified   Document Height and Actual Weight   Initiate Carrier Fluid Protocol   Neuro checks   If O2 sat   Informed Consent Details: Physician/Practitioner Attestation; Transcribe to consent form and obtain patient signature   CBC post transfusion - RN to place lab order with appropriate draw time   Consult to gastroenterology  Please page [facility] as patient has established care with them   Consult to intensivist   Activate [employer].  [facility] [employer] Patients are identified by an ED Provider and need the prioritization of an emergent CT scan and must be transported by a RN with  a monitor.   Consult to intensivist   Consult to hospitalist   Oxygen therapy Mode or (Route): Nasal cannula; Liters Per Minute: 2; Keep 02 saturation: greater than 92 %   CBG monitoring, ED   I-Stat CG4 Lactic Acid   I-Stat venous blood gas, ([facility], [facility], [facility])   I-stat chem 8, ED (not at [facility], [facility] or [facility])   POC occult blood, ED   EKG 12-Lead   ED EKG   EKG 12-Lead   Type and screen [facility]   Prepare RBC   ABO/Rh   Prepare RBC (crossmatch)   Seizure precautions     OTHER Significant initial  Findings:  labs showing:     DM  labs:  HbA1C: Recent Labs    10/07/22 1405 01/11/23 0958  HGBA1C 6.5 6.0*       CBG (last 3)  Recent Labs    01/30/23 1908  GLUCAP 121*          Cultures:    Component Value Date/Time   SDES BREAST  11/27/2021 1615   SPECREQUEST Normal 11/27/2021 1615   CULT  11/27/2021 1615    RARE MORGANELLA MORGANII NO ANAEROBES ISOLATED Performed at [facility], [address] [place], [place] [address]    REPTSTATUS 12/02/2021 FINAL 11/27/2021 1615     Radiological Exams on Admission: CT ANGIO GI BLEED  Result Date: 01/30/2023 CLINICAL DATA:  Bloody diarrhea. Concern for acute GI bleed or mesenteric ischemia. EXAM: CTA ABDOMEN AND PELVIS WITHOUT AND WITH CONTRAST TECHNIQUE: Multidetector CT imaging of the abdomen and pelvis was performed using the standard protocol during bolus administration of intravenous contrast. Multiplanar reconstructed images and MIPs were obtained and reviewed to evaluate the vascular anatomy. RADIATION DOSE REDUCTION: This exam was performed according to the departmental dose-optimization program which includes automated exposure control, adjustment of the mA and/or kV according to patient size and/or use of iterative reconstruction technique. CONTRAST:  75mL OMNIPAQUE IOHEXOL 350 MG/ML SOLN COMPARISON:  09/10/2021 FINDINGS: VASCULAR Aorta: Normal caliber aorta without aneurysm, dissection, vasculitis or significant stenosis. Scattered aortic calcifications. Celiac: Patent without evidence of aneurysm, dissection, vasculitis or significant stenosis. SMA: Patent without evidence of aneurysm, dissection, vasculitis or significant stenosis. Renals: Both renal arteries are patent without evidence of aneurysm, dissection, vasculitis, fibromuscular dysplasia or significant stenosis. IMA: Patent without evidence of aneurysm, dissection, vasculitis or significant stenosis. Inflow: Atherosclerotic calcifications. No aneurysm or dissection. No stenosis. Proximal Outflow: Bilateral common femoral and visualized portions of the superficial and profunda femoral arteries are patent without evidence of aneurysm, dissection, vasculitis or significant stenosis. Veins: No obvious venous  abnormality within the limitations of this arterial phase study. Review of the MIP images confirms the above findings. NON-VASCULAR Lower chest: No acute findings Hepatobiliary: Stable left hepatic cyst which appears benign. No suspicious focal hepatic abnormality. Gallbladder unremarkable. Pancreas: No focal abnormality or ductal dilatation. Spleen: No focal abnormality.  Normal size. Adrenals/Urinary Tract: Diffuse enlargement of the left adrenal gland. Right adrenal nodule is unchanged. No renal or ureteral stones. No renal or ureteral stones. No hydronephrosis. Urinary bladder unremarkable. Stomach/Bowel: High-density material throughout the colon making it difficult to assess for contrast extravasation. No obvious contrast extravasation. Moderate stool burden throughout the colon. No abnormal wall thickening to suggest ischemia. No evidence of bowel obstruction. Changes of gastric bypass. Lymphatic: No adenopathy Reproductive: Prior hysterectomy.  No adnexal masses. Other: No free fluid or free air. Musculoskeletal: Postoperative changes in the lumbar spine. Fluid collection in  the midline of the back in the subcutaneous soft tissues measuring 5.1 x 3.7 cm compatible with postoperative fluid collection. No acute bony abnormality. IMPRESSION: VASCULAR Scattered aortoiliac atherosclerosis. No significant stenosis, aneurysm or dissection. Difficult to assess for GI bleed within the colon due to ingested high-density material throughout the colon. No obvious or large GI bleed visualized. NON-VASCULAR No changes of mesenteric ischemia noted. Mild gaseous distention of the colon moderate stool burden. Electronically Signed   By: Charlett Nose M.D.   On: 01/30/2023 22:19   DG Chest Port 1 View  Result Date: 01/30/2023 CLINICAL DATA:  Altered mental status EXAM: PORTABLE CHEST 1 VIEW COMPARISON:  09/09/2021 FINDINGS: Low volume AP portable examination. Cardiomegaly. Both lungs are clear. The visualized skeletal  structures are unremarkable. IMPRESSION: Low volume AP portable examination. Cardiomegaly without acute abnormality of the lungs. Electronically Signed   By: Jearld Lesch M.D.   On: 01/30/2023 20:06   _______________________________________________________________________________________________________ Latest  Blood pressure 128/61, pulse 100, temperature (!) 97.5 F (36.4 C), temperature source Oral, resp. rate 20, SpO2 100%.   Vitals  labs and radiology finding personally reviewed  Review of Systems:    Pertinent positives include:   chills, fatigue   Constitutional:  No weight loss, night sweats, Fevers,, weight loss  HEENT:  No headaches, Difficulty swallowing,Tooth/dental problems,Sore throat,  No sneezing, itching, ear ache, nasal congestion, post nasal drip,  Cardio-vascular:  No chest pain, Orthopnea, PND, anasarca, dizziness, palpitations.no Bilateral lower extremity swelling  GI:  No heartburn, indigestion, abdominal pain, nausea, vomiting, diarrhea, change in bowel habits, loss of appetite, melena, blood in stool, hematemesis Resp:  no shortness of breath at rest. No dyspnea on exertion, No excess mucus, no productive cough, No non-productive cough, No coughing up of blood.No change in color of mucus.No wheezing. Skin:  no rash or lesions. No jaundice GU:  no dysuria, change in color of urine, no urgency or frequency. No straining to urinate.  No flank pain.  Musculoskeletal:  No joint pain or no joint swelling. No decreased range of motion. No back pain.  Psych:  No change in mood or affect. No depression or anxiety. No memory loss.  Neuro: no localizing neurological complaints, no tingling, no weakness, no double vision, no gait abnormality, no slurred speech, no confusion  All systems reviewed and apart from HOPI all are negative _______________________________________________________________________________________________ Past Medical History:   Past Medical  History:  Diagnosis Date   Anxiety    Arthritis    Asthma    Bipolar 1 disorder (HCC)    Breast discharge    Breast lump    Breast pain    CAD (coronary artery disease) 12/03/2022    o Cath 2008: no CAD   o CCTA 10/05/19: CAC score 49, 88th percentile, nonobstructive CAD (LM 1-24 mid and dist)  o Myoview 12/31/20: EF 45, no ischemia or infarction, low risk    Chronic pain    on MS Contin and oxycodone   COPD (chronic obstructive pulmonary disease) (HCC)    Depression    Diabetes mellitus    diet and exercise controlled   Dysrhythmia    HR in 30s on metoprolol   Escherichia coli (E. coli) infection    Fever    GERD (gastroesophageal reflux disease)    History of chest pain    History of knee replacement, total    Hypertension    Hypothyroidism    N&V (nausea and vomiting)    Peripheral neuropathy    Right  foot no sensation   Sciatica    Sleep apnea    does not use CPAP   Thyroid disease    Wears glasses       Past Surgical History:  Procedure Laterality Date   ABDOMINAL HYSTERECTOMY     partial   APPENDECTOMY     BIOPSY  03/14/2018   Procedure: BIOPSY;  Surgeon: Charlott Rakes, MD;  Location: [facility];  Service: Endoscopy;;   BREAST DUCTAL SYSTEM EXCISION Right 08/31/2017   Procedure: RIGHT BREAST CENTRAL DUCT EXCISION;  Surgeon: Harriette Bouillon, MD;  Location: [facility];  Service: General;  Laterality: Right;   BREAST EXCISIONAL BIOPSY Right    BREAST LUMPECTOMY     right   CARDIAC CATHETERIZATION     no significant CAD, nl LV function by 11/08/06 cath   CESAREAN SECTION     x4   COLONOSCOPY WITH PROPOFOL N/A 03/14/2018   Procedure: COLONOSCOPY WITH PROPOFOL;  Surgeon: Charlott Rakes, MD;  Location: [facility];  Service: Endoscopy;  Laterality: N/A;   GASTRIC ROUX-EN-Y N/A 11/25/2014   Procedure: LAPAROSCOPIC ROUX-EN-Y GASTRIC BYPASS WITH UPPER ENDOSCOPY;  Surgeon: Luretha Murphy, MD;  Location: [facility];  Service: General;  Laterality:  N/A;   HERNIA REPAIR     left inguinal   JOINT REPLACEMENT     KNEE SURGERY     right knee   MOUTH SURGERY     MYOMECTOMY ABDOMINAL APPROACH  1989   POLYPECTOMY  03/14/2018   Procedure: POLYPECTOMY;  Surgeon: Charlott Rakes, MD;  Location: [facility];  Service: Endoscopy;;   TOTAL KNEE REVISION  06/21/2012   Procedure: TOTAL KNEE REVISION;  Surgeon: Nadara Mustard, MD;  Location: [facility];  Service: Orthopedics;  Laterality: Right;  Revision Right Total Knee Arthroplasty    Social History:  Ambulatory   independently      reports that she quit smoking about 4 years ago. Her smoking use included cigarettes. She started smoking about 47 years ago. She has a 11 pack-year smoking history. She has never used smokeless tobacco. She reports that she does not currently use alcohol. She reports that she does not use drugs.     Family History:   Family History  Problem Relation Age of Onset   Cancer Father        colon   Stroke Father    Heart disease Father    Diabetes Father    Hypertension Father    Depression Sister    Hypertension Mother    Breast cancer Neg Hx    ______________________________________________________________________________________________ Allergies: Allergies  Allergen Reactions   Aspirin Other (See Comments), Shortness Of Breath and Anaphylaxis    Pt states she has had Toradol several times without any reactions  Other Reaction(s): Other (See Comments), respiratory distress   Bee Venom Anaphylaxis   Sulfa Antibiotics Anaphylaxis   Evolocumab     Other Reaction(s): Unknown   Other Other (See Comments)    No blood products.  Patient did  Request that only albumin or albumin-containing products may be administered    Prior to Admission medications   Medication Sig Start Date End Date Taking? Authorizing Provider  dapagliflozin propanediol (FARXIGA) 5 MG TABS tablet Take 1 tablet (5 mg total) by mouth daily before breakfast. 10/07/22  Yes Newlin,  Enobong, MD  solifenacin (VESICARE) 5 MG tablet Take 1 tablet (5 mg total) by mouth daily. 10/07/22  Yes Hoy Register, MD  albuterol (VENTOLIN HFA) 108 (90 Base) MCG/ACT inhaler INHALE 2  PUFFS INTO THE LUNGS EVERY 6 HOURS AS NEEDED FOR WHEEZING OR SHORTNESS OF BREATH 09/23/21   Hoy Register, MD  Alirocumab (PRALUENT) 150 MG/ML SOAJ INJECT 1 PEN INTO SKIN EVERY 14 DAYS. 12/03/22   Tereso Newcomer T, PA-C  ALPRAZolam Prudy Feeler) 1 MG tablet Take 1 mg by mouth 3 (three) times daily as needed for anxiety. 07/02/14   [provider]  amphetamine-dextroamphetamine (ADDERALL XR) 30 MG 24 hr capsule Take 30 mg by mouth daily.  03/10/18   [provider]  amphetamine-dextroamphetamine (ADDERALL) 20 MG tablet Take 20 mg by mouth every evening. 10/13/17   [provider]  Blood Glucose Monitoring Suppl (ACCU-CHEK GUIDE) w/Device KIT 1 kit by Does not apply route 3 (three) times daily. To check blood sugars 04/10/21   Hoy Register, MD  cetirizine (ZYRTEC) 10 MG tablet Take 1 tablet (10 mg total) by mouth daily. 04/07/21   Hoy Register, MD  Cyanocobalamin (VITAMIN B 12 PO) Take 1 tablet by mouth daily. Unsure of dose    [provider]  cyclobenzaprine (FLEXERIL) 10 MG tablet Take 1 tablet (10 mg total) by mouth 3 (three) times daily as needed for muscle spasms. 01/14/23   Tressie Stalker, MD  diclofenac Sodium (VOLTAREN) 1 % GEL Apply 2-4 g topically 2 (two) times daily as needed (wrist/knee pain). 11/23/21   Hoy Register, MD  docusate sodium (COLACE) 100 MG capsule Take 1 capsule (100 mg total) by mouth 2 (two) times daily. 01/14/23   Tressie Stalker, MD  doxepin (SINEQUAN) 75 MG capsule Take 75 mg by mouth at bedtime. 03/02/18   [provider]  fluticasone (FLONASE) 50 MCG/ACT nasal spray SHAKE LIQUID AND USE TWO SPRAYS IN EACH NOSTRIL DAILY 12/21/22   Hoy Register, MD  glucose blood (ACCU-CHEK GUIDE) test strip Use as instructed 04/10/21   Hoy Register, MD   hydrocortisone-pramoxine (ANALPRAM HC) 2.5-1 % rectal cream Place 1 application rectally 3 (three) times daily. 07/13/21   Hoy Register, MD  isosorbide mononitrate (IMDUR) 30 MG 24 hr tablet Take 1 tablet (30 mg total) by mouth daily. 12/11/20   Lyn Records, MD  Lancets (ACCU-CHEK MULTICLIX) lancets Use as instructed 04/10/21   Hoy Register, MD  levothyroxine (SYNTHROID) 150 MCG tablet TAKE 1 TABLET(150 MCG) BY MOUTH DAILY BEFORE BREAKFAST 05/10/22   Hoy Register, MD  linaclotide (LINZESS) 290 MCG CAPS capsule Take 290 mcg by mouth See admin instructions. Every other night    [provider]  metoprolol succinate (TOPROL XL) 25 MG 24 hr tablet Take 0.5 tablets (12.5 mg total) by mouth at bedtime. Patient not taking: Reported on 01/11/2023 01/03/23   Tereso Newcomer T, PA-C  morphine (MS CONTIN) 15 MG 12 hr tablet Take 1 tablet (15 mg total) by mouth every 12 (twelve) hours. 11/14/14   Jones Bales, NP  naloxone [facility]) nasal spray 4 mg/0.1 mL Place 1 spray into the nose as needed (overdose). 05/22/20   [provider]  nitroGLYCERIN (NITROSTAT) 0.4 MG SL tablet DISSOLVE 1 TABLET UNDER THE TONGUE EVERY 5 MINUTES AS NEEDED 12/03/22   Tereso Newcomer T, PA-C  Olmesartan-amLODIPine-HCTZ 40-5-25 MG TABS Take 1 tablet by mouth daily. 12/08/22   Hoy Register, MD  omeprazole (PRILOSEC) 40 MG capsule TAKE 1 CAPSULE(40 MG) BY MOUTH DAILY 01/22/20   Fulp, Cammie, MD  oxyCODONE-acetaminophen (PERCOCET/ROXICET) 5-325 MG tablet Take 1-2 tablets by mouth every 4 (four) hours as needed for moderate pain. 01/14/23   Tressie Stalker, MD  promethazine (PHENERGAN) 25 MG  tablet Take 1 tablet (25 mg total) by mouth every 8 (eight) hours as needed for nausea or vomiting. 12/24/22   Delorse Lek, FNP  REXULTI 2 MG TABS tablet Take 2 mg by mouth daily.    [provider]  spironolactone (ALDACTONE) 25 MG tablet Take 0.5 tablets (12.5 mg total) by mouth daily. 12/08/22   Hoy Register, MD   umeclidinium-vilanterol (ANORO ELLIPTA) 62.5-25 MCG/INH AEPB Inhale 1 puff into the lungs daily. 04/17/19   Jetty Duhamel D, MD  vortioxetine HBr (TRINTELLIX) 20 MG TABS tablet Take 20 mg by mouth daily.    [provider]  escitalopram (LEXAPRO) 20 MG tablet Take 20 mg by mouth daily.  12/14/18  [provider]  LATUDA 120 MG TABS Take 120 mg by mouth at bedtime.  03/04/18 12/14/18  [provider]  LISINOPRIL PO Take by mouth daily.    09/06/11  [provider]  metoCLOPramide (REGLAN) 10 MG tablet Take 1 tablet (10 mg total) by mouth every 8 (eight) hours as needed for nausea. Patient not taking: Reported on 12/06/2019 06/30/18 05/12/20  Marily Memos, MD    ___________________________________________________________________________________________________ Physical Exam:    01/30/2023   11:20 PM 01/30/2023   11:15 PM 01/30/2023   11:10 PM  Vitals with BMI  Systolic 128 129 846  Diastolic 61 74 75  Pulse 100 99 100     1. General:  in No  Acute distress   Chronically ill  -appearing 2. Psychological: Alert and   Oriented 3. Head/ENT:    Dry Mucous Membranes                          Head Non traumatic, neck supple                         Poor Dentition 4. SKIN: decreased Skin turgor,  Skin clean Dry and intact no rash    5. Heart: Regular rate and rhythm no  Murmur, no Rub or gallop 6. Lungs:  no wheezes or crackles   7. Abdomen: Soft,  non-tender, Non distended   obese  bowel sounds present 8. Lower extremities: no clubbing, cyanosis, no  edema 9. Neurologically Grossly intact, moving all 4 extremities equally   10. MSK: Normal range of motion    Chart has been reviewed  ______________________________________________________________________________________________  Assessment/Plan 62 y.o. female with medical history significant of CAD and anxiety asthma bipolar disorder COPD DM2 hypertension hypothyroidism   Admitted for   Gastrointestinal  hemorrhage with melena,    Acute blood loss anemia.  Lactic acidosis,     Acute kidney injury      Present on Admission:  Hypothyroidism  Morbid obesity (HCC)  Diabetes mellitus with neuropathy (HCC)  COPD mixed type (HCC)  Hyperlipidemia  CAD (coronary artery disease)  Upper GI bleed  UTI (urinary tract infection)  Lactic acidosis  CKD (chronic kidney disease) stage 3, GFR 30-59 ml/min (HCC)  AKI (acute kidney injury) (HCC)  Essential (primary) hypertension      Hypothyroidism - Check TSH continue home medications Synthroid at po q day   Morbid obesity (HCC) Follow-up as an outpatient once stable  Diabetes mellitus with neuropathy (HCC) Order sliding scale hold p.o. medications for now hold Farxiga check beta hydroxybutyric acid  COPD mixed type (HCC) Chronic stable  Hyperlipidemia Resume home medications once stable  CAD (coronary artery disease) Transfuse for hemoglobin of 8 hold off  on any aspirin for now  Upper GI bleed  - Glasgow Blatchford score BUN >18.2   Hg < <38F   melena   >1 Justifies admission and aggressive management     hemodynamic instability present      -  Admit to stepdown given above ER provider discussed case with critical care medicine Patient noted to have hemoglobin of 3 Lactic acid of 14 Initially soft blood pressures now improving after blood transfusion At this point critical care recommended medicine admission    -  ER  Provider spoke to gastroenterology [facility] ) they will see patient in a.m. appreciate their consult   - serial CBC.    - Monitor very closely in progressive if patient decompensates will need to reconsult critical care medicine as patient is very tenuous  - Transfuse for total of 4 units continue to monitor serial CBC If decompensates further may need emergent GI reevaluation  - Establish at least 2 PIV and fluid resuscitate   - clear liquids for tonight keep nothing by mouth post midnight,   -   administer Protonix  drip     UTI (urinary tract infection) Treat with Rocephin await results of urine culture  Lactic acidosis In the setting of profound anemia Will transfuse for total of 4 units Rehydrate And follow lactic acid Admit to progressive Appreciate intensivist consult as patient is critically ill  CKD (chronic kidney disease) stage 3, GFR 30-59 ml/min (HCC)  -chronic avoid nephrotoxic medications such as NSAIDs, Vanco Zosyn combo,  avoid hypotension, continue to follow renal function   AKI (acute kidney injury) (HCC) In the setting of severe blood loss Rehydrate follow obtain urine electrolytes  Essential (primary) hypertension Allow permissive hypertension   Other plan as per orders.  DVT prophylaxis:  SCD        Code Status:    Code Status: Prior FULL CODE as per patient   I had personally discussed CODE STATUS with patient   ACP   none     Family Communication:   Family not at  Bedside    Diet  Diet Orders (From admission, onward)     Start     Ordered   01/30/23 1849  Diet NPO time specified  Diet effective now        01/30/23 1849            Disposition Plan:        To home once workup is complete and patient is stable   Following barriers for discharge:                                                            Anemia corrected h/H                               Pain controlled with PO medications                                                           Will need consultants to evaluate patient prior to discharge  Consult Orders  (From admission, onward)           Start     Ordered   01/30/23 2312  Consult to hospitalist  paged  Once       Provider:  (Not yet assigned)  Question Answer Comment  Place call to: [facility]   Reason for Consult Admit      01/30/23 2311                               Would benefit from PT/OT eval prior to DC  Ordered                     Consults called: PCCM is aware, [facility]  is aware   Admission status:  ED Disposition     ED Disposition  Admit   Condition  --   Comment  Hospital Area: [facility] [[id number]]  Level of Care: Progressive [102]  Admit to Progressive based on following criteria: GI, ENDOCRINE disease patients with GI bleeding, acute liver failure or pancreatitis, stable with diabetic ketoacidosis or thyrotoxicosis (hypothyroid) state.  May admit patient to [facility] or [facility] if equivalent level of care is available:: No  Covid Evaluation: Asymptomatic - no recent exposure (last 10 days) testing not required  Diagnosis: Upper GI bleed [[id number]]  Admitting Physician: Therisa Doyne [[id number]]  Attending Physician: Therisa Doyne [[id number]]  Certification:: I certify this patient will need inpatient services for at least 2 midnights  Expected Medical Readiness: 02/03/2023           inpatient     I Expect 2 midnight stay secondary to severity of patient's current illness need for inpatient interventions justified by the following:  hemodynamic instability despite optimal treatment (tachycardia  hypotension  )   Severe lab/radiological/exam abnormalities including:    anemia and extensive comorbidities including:  Chronic pain  DM2   CAD  COPD/asthma  Morbid Obesity  CKD  That are currently affecting medical management.   I expect  patient to be hospitalized for 2 midnights requiring inpatient medical care.  Patient is at high risk for adverse outcome (such as loss of life or disability) if not treated.  Indication for inpatient stay as follows:  Severe change from baseline regarding mental status Hemodynamic instability despite maximal medical therapy,      inability to maintain oral hydration     Need for operative/procedural  intervention    Need for IV antibiotics, IV fluids,      Level of care         progressive     stepdown   tele indefinitely please discontinue once patient no longer  qualifies COVID-19 Labs  Critical care spent >60 min pt w severe bleeding and hemodynamic instability    Tahiri Shareef 01/30/2023, 1:22 AM    [facility]     after 2 AM please page floor coverage PA If 7AM-7PM, please contact the day team taking care of the patient using [email or web address]

## 2023-01-30 NOTE — ED Notes (Signed)
EMERGENCY BLOOD TRANSFUSION UNIT STARTED OVER AN HOUR, VERBAL ORDER PER MARIAH, PA.

## 2023-01-30 NOTE — ED Triage Notes (Signed)
Patient BIB EMS home. Family called for seizure activity. Unknown last known well. N/V/D for days. Patient cover in BM. Recent back surgery. Take pain medication all bottles are empty. Unknown SI and HI. Baseline A&Ox4 per family.  86, 108/50, CBG 204, 92%RA. Placed 2liters, 20g LAC

## 2023-01-30 NOTE — Consult Note (Signed)
NAME:  Caroline Williams, MRN:  [id number], DOB:  16-Feb-1961, LOS: 0 ADMISSION DATE:  01/30/2023, CONSULTATION DATE:  01/30/23 REFERRING MD:  Lonzo Cloud - EM PA, CHIEF COMPLAINT:  possible seizure activity // low hemoglobin     History of Present Illness:  62 yo F who is JW w hx blood product refusal, PMH/PSH --  POD 18 L4-5 decompression instrumentation and fusion, HTN, OSA, COPD, hx tobacco use who presented to ED via EMS after family called c/f possible sz because of shaking. She arrived to ED covered in stool, reportedly had been n/v for a few days -- pt reported non-bloody vomit and large volume black stools to EM PA for the last day. No blood thinners.  In ED her hgb was 3. FOB +  The possible seizure activity has been seen in ED and is shivering.   Pt reportedly has rescinded prior blood refusal for tonight. As such, She was given 2 units of emergency release blood followed by 1 L crystalloid.  GI was called and will follow.  Pt is HDS. LA is elevated at 11. Hgb 3, plt 350.  PCCM was consulted in this setting   Pertinent  Medical History  Jehovah's Witness S/p L4-5 decompression, fusion HTN OSA COPD Hx tobacco use Bipolar disorder   Significant Hospital Events: Including procedures, antibiotic start and stop dates in addition to other pertinent events   8/18 admitted to the hospital with GI bleed melena hemoglobin of 3  Interim History / Subjective:    Objective   Blood pressure (!) 113/54, pulse 94, temperature (!) 97.4 F (36.3 C), temperature source Oral, resp. rate 18, SpO2 100%.       No intake or output data in the 24 hours ending 01/30/23 2259 There were no vitals filed for this visit.  Examination: General: Lying in bed, no acute distress HENT: Dry mucous membranes, EOMI no icterus Lungs: Normal breathing, clear Cardiovascular: Borderline tachycardic, no murmur Abdomen: Nondistended, bowel sounds present Extremities: No edema Neuro: Alert, oriented, moves all  extremities   Resolved Hospital Problem list     Assessment & Plan:   ABLA GIB -GI has been consulted  -check DIC panel  -add'l 2 PRBC ordered -PPI IV twice daily -Discussed patient wishes regarding blood transfusion, previously documented refusal Jehovah's Witness, described risks and benefits of blood transfusion and refusal blood transfusion, she consents to blood transfusions at this time, she acknowledges she is already received 2 units of blood  AKI AGMA Lactic acidosis  -Suspect related to vasoconstriction, hypovolemia -Status post fluids, ordering additional blood  Recommend admission to medicine, progressive.  No indication for ICU admission.  Please contact PCCM if we can be of any assistance in the future.  Best Practice (right click and "Reselect all SmartList Selections" daily)   Per Primary  Labs   CBC: Recent Labs  Lab 01/30/23 1858 01/30/23 1954  WBC 13.9* 13.8*  NEUTROABS  --  11.4*  HGB 3.0* 3.0*  HCT 10.5* 10.0*  MCV 96.3 99.0  PLT 375 350    Basic Metabolic Panel: Recent Labs  Lab 01/30/23 1858  NA 133*  K 4.4  CL 98  CO2 8*  GLUCOSE 140*  BUN 68*  CREATININE 2.61*  CALCIUM 7.8*   GFR: CrCl cannot be calculated (Unknown ideal weight.). Recent Labs  Lab 01/30/23 1858 01/30/23 1954 01/30/23 2014 01/30/23 2253  WBC 13.9* 13.8*  --   --   LATICACIDVEN  --   --  14.0* 11.2*  Liver Function Tests: Recent Labs  Lab 01/30/23 1933  AST 52*  ALT 29  ALKPHOS 72  BILITOT 0.7  PROT 6.0*  ALBUMIN 2.9*   Recent Labs  Lab 01/30/23 1933  LIPASE 73*   No results for input(s): "AMMONIA" in the last 168 hours.  ABG    Component Value Date/Time   TCO2 23 09/09/2021 2250     Coagulation Profile: No results for input(s): "INR", "PROTIME" in the last 168 hours.  Cardiac Enzymes: No results for input(s): "CKTOTAL", "CKMB", "CKMBINDEX", "TROPONINI" in the last 168 hours.  HbA1C: HbA1c, POC (controlled diabetic range)   Date/Time Value Ref Range Status  10/07/2022 02:05 PM 6.5 0.0 - 7.0 % Final  07/13/2021 10:07 AM 6.8 0.0 - 7.0 % Final   Hgb A1c MFr Bld  Date/Time Value Ref Range Status  01/11/2023 09:58 AM 6.0 (H) 4.8 - 5.6 % Final    Comment:    (NOTE)         Prediabetes: 5.7 - 6.4         Diabetes: >6.4         Glycemic control for adults with diabetes: <7.0     CBG: Recent Labs  Lab 01/30/23 1908  GLUCAP 121*    Review of Systems:   No chest pain.  Describes chronic joint pain.  No back pain, back feels better since surgery.  Comprehensive review of systems otherwise negative.  Past Medical History:  She,  has a past medical history of Anxiety, Arthritis, Asthma, Bipolar 1 disorder (HCC), Breast discharge, Breast lump, Breast pain, CAD (coronary artery disease) (12/03/2022), Chronic pain, COPD (chronic obstructive pulmonary disease) (HCC), Depression, Diabetes mellitus, Dysrhythmia, Escherichia coli (E. coli) infection, Fever, GERD (gastroesophageal reflux disease), History of chest pain, History of knee replacement, total, Hypertension, Hypothyroidism, N&V (nausea and vomiting), Peripheral neuropathy, Sciatica, Sleep apnea, Thyroid disease, and Wears glasses.   Surgical History:   Past Surgical History:  Procedure Laterality Date   ABDOMINAL HYSTERECTOMY     partial   APPENDECTOMY     BIOPSY  03/14/2018   Procedure: BIOPSY;  Surgeon: Charlott Rakes, MD;  Location: [facility];  Service: Endoscopy;;   BREAST DUCTAL SYSTEM EXCISION Right 08/31/2017   Procedure: RIGHT BREAST CENTRAL DUCT EXCISION;  Surgeon: Harriette Bouillon, MD;  Location:  SUR[facility]ERY [facility]ENTER;  Service: General;  Laterality: Right;   BREAST EXCISIONAL BIOPSY Right    BREAST LUMPECTOMY     right   CARDIAC CATHETERIZATION     no significant CAD, nl LV function by 11/08/06 cath   CESAREAN SECTION     x4   COLONOSCOPY WITH PROPOFOL N/A 03/14/2018   Procedure: COLONOSCOPY WITH PROPOFOL;  Surgeon: Charlott Rakes, MD;  Location: [facility];  Service: Endoscopy;  Laterality: N/A;   GASTRIC ROUX-EN-Y N/A 11/25/2014   Procedure: LAPAROSCOPIC ROUX-EN-Y GASTRIC BYPASS WITH UPPER ENDOSCOPY;  Surgeon: Luretha Murphy, MD;  Location: [facility];  Service: General;  Laterality: N/A;   HERNIA REPAIR     left inguinal   JOINT REPLACEMENT     KNEE SURGERY     right knee   MOUTH SURGERY     MYOMECTOMY ABDOMINAL APPROACH  1989   POLYPECTOMY  03/14/2018   Procedure: POLYPECTOMY;  Surgeon: Charlott Rakes, MD;  Location: [facility];  Service: Endoscopy;;   TOTAL KNEE REVISION  06/21/2012   Procedure: TOTAL KNEE REVISION;  Surgeon: Nadara Mustard, MD;  Location: [facility];  Service: Orthopedics;  Laterality: Right;  Revision Right Total Knee  Arthroplasty     Social History:   reports that she quit smoking about 4 years ago. Her smoking use included cigarettes. She started smoking about 47 years ago. She has a 11 pack-year smoking history. She has never used smokeless tobacco. She reports that she does not currently use alcohol. She reports that she does not use drugs.   Family History:  Her family history includes Cancer in her father; Depression in her sister; Diabetes in her father; Heart disease in her father; Hypertension in her father and mother; Stroke in her father. There is no history of Breast cancer.   Allergies Allergies  Allergen Reactions   Aspirin Other (See Comments), Shortness Of Breath and Anaphylaxis    Pt states she has had Toradol several times without any reactions  Other Reaction(s): Other (See Comments), respiratory distress   Bee Venom Anaphylaxis   Sulfa Antibiotics Anaphylaxis   Evolocumab     Other Reaction(s): Unknown   Other Other (See Comments)    No blood products.  Patient did  Request that only albumin or albumin-containing products may be administered     Home Medications  Prior to Admission medications   Medication Sig Start Date End Date Taking? Authorizing  Provider  dapagliflozin propanediol (FARXIGA) 5 MG TABS tablet Take 1 tablet (5 mg total) by mouth daily before breakfast. 10/07/22  Yes Newlin, Enobong, MD  solifenacin (VESICARE) 5 MG tablet Take 1 tablet (5 mg total) by mouth daily. 10/07/22  Yes Newlin, Enobong, MD  albuterol (VENTOLIN HFA) 108 (90 Base) MCG/ACT inhaler INHALE 2 PUFFS INTO THE LUNGS EVERY 6 HOURS AS NEEDED FOR WHEEZING OR SHORTNESS OF BREATH 09/23/21   Hoy Register, MD  Alirocumab (PRALUENT) 150 MG/ML SOAJ INJECT 1 PEN INTO SKIN EVERY 14 DAYS. 12/03/22   Tereso Newcomer T, PA-C  ALPRAZolam Prudy Feeler) 1 MG tablet Take 1 mg by mouth 3 (three) times daily as needed for anxiety. 07/02/14   [provider]  amphetamine-dextroamphetamine (ADDERALL XR) 30 MG 24 hr capsule Take 30 mg by mouth daily.  03/10/18   [provider]  amphetamine-dextroamphetamine (ADDERALL) 20 MG tablet Take 20 mg by mouth every evening. 10/13/17   [provider]  Blood Glucose Monitoring Suppl (ACCU-CHEK GUIDE) w/Device KIT 1 kit by Does not apply route 3 (three) times daily. To check blood sugars 04/10/21   Hoy Register, MD  cetirizine (ZYRTEC) 10 MG tablet Take 1 tablet (10 mg total) by mouth daily. 04/07/21   Hoy Register, MD  Cyanocobalamin (VITAMIN B 12 PO) Take 1 tablet by mouth daily. Unsure of dose    [provider]  cyclobenzaprine (FLEXERIL) 10 MG tablet Take 1 tablet (10 mg total) by mouth 3 (three) times daily as needed for muscle spasms. 01/14/23   Tressie Stalker, MD  diclofenac Sodium (VOLTAREN) 1 % GEL Apply 2-4 g topically 2 (two) times daily as needed (wrist/knee pain). 11/23/21   Hoy Register, MD  docusate sodium (COLACE) 100 MG capsule Take 1 capsule (100 mg total) by mouth 2 (two) times daily. 01/14/23   Tressie Stalker, MD  doxepin (SINEQUAN) 75 MG capsule Take 75 mg by mouth at bedtime. 03/02/18   [provider]  fluticasone (FLONASE) 50 MCG/ACT nasal spray SHAKE LIQUID AND USE TWO SPRAYS IN  EACH NOSTRIL DAILY 12/21/22   Hoy Register, MD  glucose blood (ACCU-CHEK GUIDE) test strip Use as instructed 04/10/21   Hoy Register, MD  hydrocortisone-pramoxine (ANALPRAM HC) 2.5-1 % rectal cream Place 1 application rectally 3 (  three) times daily. 07/13/21   Hoy Register, MD  isosorbide mononitrate (IMDUR) 30 MG 24 hr tablet Take 1 tablet (30 mg total) by mouth daily. 12/11/20   Lyn Records, MD  Lancets (ACCU-CHEK MULTICLIX) lancets Use as instructed 04/10/21   Hoy Register, MD  levothyroxine (SYNTHROID) 150 MCG tablet TAKE 1 TABLET(150 MCG) BY MOUTH DAILY BEFORE BREAKFAST 05/10/22   Hoy Register, MD  linaclotide (LINZESS) 290 MCG CAPS capsule Take 290 mcg by mouth See admin instructions. Every other night    [provider]  metoprolol succinate (TOPROL XL) 25 MG 24 hr tablet Take 0.5 tablets (12.5 mg total) by mouth at bedtime. Patient not taking: Reported on 01/11/2023 01/03/23   Tereso Newcomer T, PA-C  morphine (MS CONTIN) 15 MG 12 hr tablet Take 1 tablet (15 mg total) by mouth every 12 (twelve) hours. 11/14/14   Jones Bales, NP  naloxone [facility]) nasal spray 4 mg/0.1 mL Place 1 spray into the nose as needed (overdose). 05/22/20   [provider]  nitroGLYCERIN (NITROSTAT) 0.4 MG SL tablet DISSOLVE 1 TABLET UNDER THE TONGUE EVERY 5 MINUTES AS NEEDED 12/03/22   Tereso Newcomer T, PA-C  Olmesartan-amLODIPine-HCTZ 40-5-25 MG TABS Take 1 tablet by mouth daily. 12/08/22   Hoy Register, MD  omeprazole (PRILOSEC) 40 MG capsule TAKE 1 CAPSULE(40 MG) BY MOUTH DAILY 01/22/20   Fulp, Cammie, MD  oxyCODONE-acetaminophen (PERCOCET/ROXICET) 5-325 MG tablet Take 1-2 tablets by mouth every 4 (four) hours as needed for moderate pain. 01/14/23   Tressie Stalker, MD  promethazine (PHENERGAN) 25 MG tablet Take 1 tablet (25 mg total) by mouth every 8 (eight) hours as needed for nausea or vomiting. 12/24/22   Delorse Lek, FNP  REXULTI 2 MG TABS tablet Take 2 mg by mouth daily.     [provider]  spironolactone (ALDACTONE) 25 MG tablet Take 0.5 tablets (12.5 mg total) by mouth daily. 12/08/22   Hoy Register, MD  umeclidinium-vilanterol (ANORO ELLIPTA) 62.5-25 MCG/INH AEPB Inhale 1 puff into the lungs daily. 04/17/19   Jetty Duhamel D, MD  vortioxetine HBr (TRINTELLIX) 20 MG TABS tablet Take 20 mg by mouth daily.    [provider]  escitalopram (LEXAPRO) 20 MG tablet Take 20 mg by mouth daily.  12/14/18  [provider]  LATUDA 120 MG TABS Take 120 mg by mouth at bedtime.  03/04/18 12/14/18  [provider]  LISINOPRIL PO Take by mouth daily.    09/06/11  [provider]  metoCLOPramide (REGLAN) 10 MG tablet Take 1 tablet (10 mg total) by mouth every 8 (eight) hours as needed for nausea. Patient not taking: Reported on 12/06/2019 06/30/18 05/12/20  Mesner, Barbara Cower, MD     Critical care time: n/a     Karren Burly, MD

## 2023-01-30 NOTE — H&P (Incomplete)
Caroline QUERTERMOUS ZOX:[id number] DOB: 04-Apr-1961 DOA: 01/30/2023     PCP: Hoy Register, MD   Outpatient Specialists: * NONE CARDS: * Dr. Orbie Pyo, MD  NEphrology: *  Dr. No care team member to display  NEurology *   Dr. Pulmonary *  Dr.  Oncology * Dr.No care team member to display  GI* Dr.  Deboraha Sprang, [facility]) No care team member to display Urology Dr. *  Patient arrived to ER on 01/30/23 at 1808 Referred by Attending Glyn Ade, MD   Patient coming from:    home Lives alone,   *** With family    Chief Complaint:   Chief Complaint  Patient presents with  . Seizures  . Altered Mental Status    HPI: Caroline Williams is a 62 y.o. female with medical history significant of CAD and anxiety asthma bipolar disorder COPD DM2 hypertension hypothyroidism    Presented with melena Presents with left-sided abdominal pain nausea vomiting diarrhea also have been having nonbloody vomiting but now having large amount of black stools 2019 she had a benign polyp no fevers no chills no hematemesis not on any blood thinners does not drink alcohol      Denies significant ETOH intake   Does not smoke*** but interested in quitting***  Lab Results  Component Value Date   SARSCOV2NAA Not Detected 07/12/2019   SARSCOV2NAA Not Detected 01/30/2019       Regarding pertinent Chronic problems:    ****Hyperlipidemia - *on statins {statin:315258}  Lipid Panel     Component Value Date/Time   CHOL 280 (H) 04/07/2021 1202   TRIG 61 04/07/2021 1202   HDL 87 04/07/2021 1202   CHOLHDL 1.6 07/14/2020 0754   LDLCALC 184 (H) 04/07/2021 1202   LDLDIRECT 84 04/10/2020 0759   LABVLDL 9 04/07/2021 1202       HTN on Imdur, Toprol, olmesartan/hydrochlorothiazide/amlodipine, spironolactone,     chronic CHF diastolic/systolic/ combined - last echo  Recent Results (from the past 81191 hour(s))  ECHOCARDIOGRAM COMPLETE   Collection Time: 12/28/22  8:14 AM  Result Value   Weight 3,552    Height 65   Area-P 1/2 3.13   S' Lateral 3.30   Radius 0.60   MV M vel 3.75   MV Peak grad 56.3   Est EF 50 - 55%   Narrative      ECHOCARDIOGRAM REPORT         1. Left ventricular ejection fraction, by estimation, is 50 to 55%. Left ventricular ejection fraction by 3D volume is 51 %. The left ventricle has low normal function. The left ventricle demonstrates global hypokinesis. There is moderate concentric  left ventricular hypertrophy. Left ventricular diastolic parameters are indeterminate. The average left ventricular global longitudinal strain is -16.1 %. The global longitudinal strain is normal.  2. Right ventricular systolic function is normal. The right ventricular size is normal. There is normal pulmonary artery systolic pressure. The estimated right ventricular systolic pressure is 18.4 mmHg.  3. The mitral valve is normal in structure. Mild to moderate mitral valve regurgitation. No evidence of mitral stenosis.  4. The aortic valve is tricuspid. Aortic valve regurgitation is not visualized. Aortic valve sclerosis is present, with no evidence of aortic valve stenosis.  5. The inferior vena cava is normal in size with greater than 50% respiratory variability, suggesting right atrial pressure of 3 mmHg.              CAD  - On Aspirin, statin, betablocker,  Plavix                 - *followed by cardiology                - last cardiac cath        DM 2 -  Lab Results  Component Value Date   HGBA1C 6.0 (H) 01/11/2023   on Farxiga   Hypothyroidism:   Lab Results  Component Value Date   TSH 91.900 (H) 10/07/2022   T3TOTAL <20 (L) 10/07/2022   T4TOTAL 5.2 01/11/2020   on synthroid    Morbid obesity-   BMI Readings from Last 1 Encounters:  01/12/23 39.40 kg/m     *** Asthma -well *** controlled on home inhalers/ nebs                     *** COPD - not **followed by pulmonology *** not  on baseline oxygen  *L,    *** OSA -on nocturnal oxygen, *CPAP,  *noncompliant with CPAP     CKD stage IIIa-   baseline Cr  1.4 CrCl cannot be calculated (Unknown ideal weight.).  Lab Results  Component Value Date   CREATININE 2.61 (H) 01/30/2023   CREATININE 1.39 (H) 01/11/2023   CREATININE 1.38 (H) 10/07/2022   Lab Results  Component Value Date   NA 133 (L) 01/30/2023   CL 98 01/30/2023   K 4.4 01/30/2023   CO2 8 (L) 01/30/2023   BUN 68 (H) 01/30/2023   CREATININE 2.61 (H) 01/30/2023   GFRNONAA 20 (L) 01/30/2023   CALCIUM 7.8 (L) 01/30/2023   ALBUMIN 2.9 (L) 01/30/2023   GLUCOSE 140 (H) 01/30/2023      Chronic anemia - baseline hg Hemoglobin & Hematocrit  Recent Labs    01/11/23 0918 01/30/23 1858 01/30/23 1954  HGB 11.0* 3.0* 3.0*   Iron/TIBC/Ferritin/ %Sat No results found for: "IRON", "TIBC", "FERRITIN", "IRONPCTSAT"      While in ER: Clinical Course as of 01/30/23 2337  Sun Jan 30, 2023  2003 Multiple people in the room for discussion about blood product receipt.  Will recheck hemoglobin first however if below 7 patient is willing to receive blood products as a lifesaving measure.  3 days of bloody diarrhea is the likely etiology. [CC]    Clinical Course User Index [CC] Glyn Ade, MD       Lab Orders         Blood culture (routine x 2)         CBC         Basic metabolic panel         Ethanol         Urine rapid drug screen (hosp performed)         Hepatic function panel         Salicylate level         Acetaminophen level         Urinalysis, Routine w reflex microscopic -Urine, Clean Catch         Lipase, blood         CBC with Differential         Occult blood card to lab, stool         DIC Panel ONCE - STAT         CBG monitoring, ED         I-Stat CG4 Lactic Acid         I-Stat venous blood gas, [facility] ED,  [facility], [facility])         I-stat chem 8, ED (not at [facility], [facility] or [facility])         POC occult blood, ED     CXR - Low volume AP portable examination. Cardiomegaly without acute abnormality of the  lungs  CTabd/pelvis - Scattered aortoiliac atherosclerosis. No significant stenosis, aneurysm or dissection.   Difficult to assess for GI bleed within the colon due to ingested high-density material throughout the colon. No obvious or large GI bleed visualized.  Following Medications were ordered in ER: Medications  LORazepam (ATIVAN) injection 1 mg (1 mg Intravenous Given 01/30/23 2012)  0.9 %  sodium chloride infusion (Manually program via Guardrails IV Fluids) (has no administration in time range)  pantoprazole (PROTONIX) injection 40 mg (40 mg Intravenous Given 01/30/23 2014)  lactated ringers bolus 1,000 mL (0 mLs Intravenous Stopped 01/30/23 2317)  0.9 %  sodium chloride infusion (Manually program via Guardrails IV Fluids) (0 mLs Intravenous Stopped 01/30/23 2302)  pantoprazole (PROTONIX) injection 40 mg (40 mg Intravenous Given 01/30/23 2242)  iohexol (OMNIPAQUE) 350 MG/ML injection 75 mL (75 mLs Intravenous Contrast Given 01/30/23 2208)    _______________________________________________________ ER Provider Called:   PCCM     Dr.Hunsucker They Recommend admit to medicine    SEEN in ER     Dr.  Dulce Sellar They Recommend admit to medicine   Will see in AM       ED Triage Vitals [01/30/23 1813]  Encounter Vitals Group     BP (!) 111/54     Systolic BP Percentile      Diastolic BP Percentile      Pulse Rate 92     Resp (!) 22     Temp (!) 97.4 F (36.3 C)     Temp Source Oral     SpO2 100 %     Weight      Height      Head Circumference      Peak Flow      Pain Score      Pain Loc      Pain Education      Exclude from Growth Chart   ZOXW(96)@     _________________________________________ Significant initial  Findings: Abnormal Labs Reviewed  CBC - Abnormal; Notable for the following components:      Result Value   WBC 13.9 (*)    RBC 1.09 (*)    Hemoglobin 3.0 (*)    HCT 10.5 (*)    MCHC 28.6 (*)    RDW 17.6 (*)    nRBC 1.9 (*)    All other components within  normal limits  BASIC METABOLIC PANEL - Abnormal; Notable for the following components:   Sodium 133 (*)    CO2 8 (*)    Glucose, Bld 140 (*)    BUN 68 (*)    Creatinine, Ser 2.61 (*)    Calcium 7.8 (*)    GFR, Estimated 20 (*)    Anion gap 27 (*)    All other components within normal limits  RAPID URINE DRUG SCREEN, HOSP PERFORMED - Abnormal; Notable for the following components:   Opiates POSITIVE (*)    Amphetamines POSITIVE (*)    All other components within normal limits  HEPATIC FUNCTION PANEL - Abnormal; Notable for the following components:   Total Protein 6.0 (*)    Albumin 2.9 (*)    AST 52 (*)    All other components within normal limits  SALICYLATE  LEVEL - Abnormal; Notable for the following components:   Salicylate Lvl <7.0 (*)    All other components within normal limits  ACETAMINOPHEN LEVEL - Abnormal; Notable for the following components:   Acetaminophen (Tylenol), Serum <10 (*)    All other components within normal limits  URINALYSIS, ROUTINE W REFLEX MICROSCOPIC - Abnormal; Notable for the following components:   APPearance HAZY (*)    Hgb urine dipstick MODERATE (*)    Protein, ur 30 (*)    Bacteria, UA MANY (*)    All other components within normal limits  LIPASE, BLOOD - Abnormal; Notable for the following components:   Lipase 73 (*)    All other components within normal limits  CBC WITH DIFFERENTIAL/PLATELET - Abnormal; Notable for the following components:   WBC 13.8 (*)    RBC 1.01 (*)    Hemoglobin 3.0 (*)    HCT 10.0 (*)    RDW 17.9 (*)    nRBC 1.8 (*)    Neutro Abs 11.4 (*)    Abs Immature Granulocytes 0.12 (*)    All other components within normal limits  OCCULT BLOOD X 1 CARD TO LAB, STOOL - Abnormal; Notable for the following components:   Fecal Occult Bld POSITIVE (*)    All other components within normal limits  CBG MONITORING, ED - Abnormal; Notable for the following components:   Glucose-Capillary 121 (*)    All other components  within normal limits  I-STAT CG4 LACTIC ACID, ED - Abnormal; Notable for the following components:   Lactic Acid, Venous 14.0 (*)    All other components within normal limits  I-STAT CG4 LACTIC ACID, ED - Abnormal; Notable for the following components:   Lactic Acid, Venous 11.2 (*)    All other components within normal limits      _________________________ Troponin ***ordered Cardiac Panel (last 3 results) No results for input(s): "CKTOTAL", "CKMB", "TROPONINIHS", "RELINDX" in the last 72 hours.   ECG: Ordered Personally reviewed and interpreted by me showing: HR : 87  Rhythm:Sinus rhythm Multiple ventricular premature complexes Nonspecific intraventricular conduction delay Nonspecific T abnormalities, lateral leads QTC 502  BNP (last 3 results) No results for input(s): "BNP" in the last 8760 hours.   COVID-19 Labs  Recent Labs    01/30/23 2305  DDIMER PENDING    Lab Results  Component Value Date   SARSCOV2NAA Not Detected 07/12/2019   SARSCOV2NAA Not Detected 01/30/2019    The recent clinical data is shown below. Vitals:   01/30/23 2302 01/30/23 2310 01/30/23 2315 01/30/23 2320  BP: 125/69 128/75 129/74 128/61  Pulse: 99 100 99 100  Resp: (!) 22 (!) 25 (!) 23 20  Temp:      TempSrc:      SpO2: 100% 99% 100% 100%     WBC     Component Value Date/Time   WBC 13.8 (H) 01/30/2023 1954   LYMPHSABS 1.3 01/30/2023 1954   LYMPHSABS 1.2 05/21/2021 0846   MONOABS 1.0 01/30/2023 1954   EOSABS 0.0 01/30/2023 1954   EOSABS 0.1 05/21/2021 0846   BASOSABS 0.0 01/30/2023 1954   BASOSABS 0.0 05/21/2021 0846  Lactic Acid, Venous    Component Value Date/Time   LATICACIDVEN 11.2 (HH) 01/30/2023 2253    Procalcitonin *** Ordered      UA *** no evidence of UTI  ***Pending ***not ordered   Urine analysis:    Component Value Date/Time   COLORURINE YELLOW 01/30/2023 2305   APPEARANCEUR HAZY (A) 01/30/2023 2305  APPEARANCEUR Clear 12/06/2019 1632   LABSPEC  1.019 01/30/2023 2305   PHURINE 5.0 01/30/2023 2305   GLUCOSEU NEGATIVE 01/30/2023 2305   HGBUR MODERATE (A) 01/30/2023 2305   BILIRUBINUR NEGATIVE 01/30/2023 2305   BILIRUBINUR Negative 12/06/2019 1632   KETONESUR NEGATIVE 01/30/2023 2305   PROTEINUR 30 (A) 01/30/2023 2305   UROBILINOGEN 0.2 05/12/2020 0855   NITRITE NEGATIVE 01/30/2023 2305   LEUKOCYTESUR NEGATIVE 01/30/2023 2305    Results for orders placed or performed during the hospital encounter of 01/11/23  Surgical pcr screen     Status: None   Collection Time: 01/11/23  9:17 AM   Specimen: Nasal Mucosa; Nasal Swab  Result Value Ref Range Status   MRSA, PCR NEGATIVE NEGATIVE Final   Staphylococcus aureus NEGATIVE NEGATIVE Final    Comment: (NOTE) The Xpert SA Assay (FDA approved for NASAL specimens in patients 11 years of age and older), is one component of a comprehensive surveillance program. It is not intended to diagnose infection nor to guide or monitor treatment. Performed at [facility], [address], [place], [place] [address]     ABX started Antibiotics Given (last 72 hours)     None       No results found for the last 90 days.     ________________________________________________________________  Arterial ***Venous  Blood Gas result:  pH *** pCO2 ***; pO2 ***;     %O2 Sat ***.  ABG    Component Value Date/Time   TCO2 23 09/09/2021 2250       __________________________________________________________ Recent Labs  Lab 01/30/23 1858  NA 133*  K 4.4  CO2 8*  GLUCOSE 140*  BUN 68*  CREATININE 2.61*  CALCIUM 7.8*    Cr  * stable,  Up from baseline see below Lab Results  Component Value Date   CREATININE 2.61 (H) 01/30/2023   CREATININE 1.39 (H) 01/11/2023   CREATININE 1.38 (H) 10/07/2022    Recent Labs  Lab 01/30/23 1933  AST 52*  ALT 29  ALKPHOS 72  BILITOT 0.7  PROT 6.0*  ALBUMIN 2.9*   Lab Results  Component Value Date   CALCIUM 7.8 (L) 01/30/2023           Plt: Lab Results  Component Value Date   PLT 259 01/30/2023         Recent Labs  Lab 01/30/23 1858 01/30/23 1954 01/30/23 2305  WBC 13.9* 13.8*  --   NEUTROABS  --  11.4*  --   HGB 3.0* 3.0*  --   HCT 10.5* 10.0*  --   MCV 96.3 99.0  --   PLT 375 350 259    HG/HCT * stable,  Down *Up from baseline see below    Component Value Date/Time   HGB 3.0 (LL) 01/30/2023 1954   HGB 13.1 05/21/2021 0846   HCT 10.0 (L) 01/30/2023 1954   HCT 40.9 05/21/2021 0846   MCV 99.0 01/30/2023 1954   MCV 83 05/21/2021 0846      Recent Labs  Lab 01/30/23 1933  LIPASE 73*   No results for input(s): "AMMONIA" in the last 168 hours.    .lab  _______________________________________________ Hospitalist was called for admission for *** Gastrointestinal hemorrhage with melena ***  Acute blood loss anemia ***  Lactic acidosis ***  Acute kidney injury (HCC) ***    The following Work up has been ordered so far:  Orders Placed This Encounter  Procedures  . Critical Care  . Blood culture (routine x 2)  .  DG Chest Port 1 View  . CT ANGIO GI BLEED  . CBC  . Basic metabolic panel  . Ethanol  . Urine rapid drug screen (hosp performed)  . Hepatic function panel  . Salicylate level  . Acetaminophen level  . Urinalysis, Routine w reflex microscopic -Urine, Clean Catch  . Lipase, blood  . CBC with Differential  . Occult blood card to lab, stool  . DIC Panel ONCE - STAT  . Diet NPO time specified  . Document Height and Actual Weight  . Initiate Carrier Fluid Protocol  . Neuro checks  . If O2 sat  . Informed Consent Details: Physician/Practitioner Attestation; Transcribe to consent form and obtain patient signature  . CBC post transfusion - RN to place lab order with appropriate draw time  . Consult to gastroenterology  Please page [facility] as patient has established care with them  . Consult to intensivist  . Activate Code Medical.  Code Medical Patients are identified by  an ED Provider and need the prioritization of an emergent CT scan and must be transported by a RN with a monitor.  . Consult to intensivist  . Consult to hospitalist  . Oxygen therapy Mode or (Route): Nasal cannula; Liters Per Minute: 2; Keep 02 saturation: greater than 92 %  . CBG monitoring, ED  . I-Stat CG4 Lactic Acid  . I-Stat venous blood gas, ([facility] ED, [facility], [facility])  . I-stat chem 8, ED (not at [facility], [facility] or [facility])  . POC occult blood, ED  . EKG 12-Lead  . ED EKG  . EKG 12-Lead  . Type and screen [facility]  . Prepare RBC  . ABO/Rh  . Prepare RBC (crossmatch)  . Seizure precautions     OTHER Significant initial  Findings:  labs showing:     DM  labs:  HbA1C: Recent Labs    10/07/22 1405 01/11/23 0958  HGBA1C 6.5 6.0*       CBG (last 3)  Recent Labs    01/30/23 1908  GLUCAP 121*          Cultures:    Component Value Date/Time   SDES BREAST 11/27/2021 1615   SPECREQUEST Normal 11/27/2021 1615   CULT  11/27/2021 1615    RARE MORGANELLA MORGANII NO ANAEROBES ISOLATED Performed at [facility], [address] [place], [place] [address]    REPTSTATUS 12/02/2021 FINAL 11/27/2021 1615     Radiological Exams on Admission: CT ANGIO GI BLEED  Result Date: 01/30/2023 CLINICAL DATA:  Bloody diarrhea. Concern for acute GI bleed or mesenteric ischemia. EXAM: CTA ABDOMEN AND PELVIS WITHOUT AND WITH CONTRAST TECHNIQUE: Multidetector CT imaging of the abdomen and pelvis was performed using the standard protocol during bolus administration of intravenous contrast. Multiplanar reconstructed images and MIPs were obtained and reviewed to evaluate the vascular anatomy. RADIATION DOSE REDUCTION: This exam was performed according to the departmental dose-optimization program which includes automated exposure control, adjustment of the mA and/or kV according to patient size and/or use of iterative reconstruction technique. CONTRAST:  75mL OMNIPAQUE IOHEXOL 350  MG/ML SOLN COMPARISON:  09/10/2021 FINDINGS: VASCULAR Aorta: Normal caliber aorta without aneurysm, dissection, vasculitis or significant stenosis. Scattered aortic calcifications. Celiac: Patent without evidence of aneurysm, dissection, vasculitis or significant stenosis. SMA: Patent without evidence of aneurysm, dissection, vasculitis or significant stenosis. Renals: Both renal arteries are patent without evidence of aneurysm, dissection, vasculitis, fibromuscular dysplasia or significant stenosis. IMA: Patent without evidence of aneurysm, dissection, vasculitis or significant  stenosis. Inflow: Atherosclerotic calcifications. No aneurysm or dissection. No stenosis. Proximal Outflow: Bilateral common femoral and visualized portions of the superficial and profunda femoral arteries are patent without evidence of aneurysm, dissection, vasculitis or significant stenosis. Veins: No obvious venous abnormality within the limitations of this arterial phase study. Review of the MIP images confirms the above findings. NON-VASCULAR Lower chest: No acute findings Hepatobiliary: Stable left hepatic cyst which appears benign. No suspicious focal hepatic abnormality. Gallbladder unremarkable. Pancreas: No focal abnormality or ductal dilatation. Spleen: No focal abnormality.  Normal size. Adrenals/Urinary Tract: Diffuse enlargement of the left adrenal gland. Right adrenal nodule is unchanged. No renal or ureteral stones. No renal or ureteral stones. No hydronephrosis. Urinary bladder unremarkable. Stomach/Bowel: High-density material throughout the colon making it difficult to assess for contrast extravasation. No obvious contrast extravasation. Moderate stool burden throughout the colon. No abnormal wall thickening to suggest ischemia. No evidence of bowel obstruction. Changes of gastric bypass. Lymphatic: No adenopathy Reproductive: Prior hysterectomy.  No adnexal masses. Other: No free fluid or free air. Musculoskeletal:  Postoperative changes in the lumbar spine. Fluid collection in the midline of the back in the subcutaneous soft tissues measuring 5.1 x 3.7 cm compatible with postoperative fluid collection. No acute bony abnormality. IMPRESSION: VASCULAR Scattered aortoiliac atherosclerosis. No significant stenosis, aneurysm or dissection. Difficult to assess for GI bleed within the colon due to ingested high-density material throughout the colon. No obvious or large GI bleed visualized. NON-VASCULAR No changes of mesenteric ischemia noted. Mild gaseous distention of the colon moderate stool burden. Electronically Signed   By: Charlett Nose M.D.   On: 01/30/2023 22:19   DG Chest Port 1 View  Result Date: 01/30/2023 CLINICAL DATA:  Altered mental status EXAM: PORTABLE CHEST 1 VIEW COMPARISON:  09/09/2021 FINDINGS: Low volume AP portable examination. Cardiomegaly. Both lungs are clear. The visualized skeletal structures are unremarkable. IMPRESSION: Low volume AP portable examination. Cardiomegaly without acute abnormality of the lungs. Electronically Signed   By: Jearld Lesch M.D.   On: 01/30/2023 20:06   _______________________________________________________________________________________________________ Latest  Blood pressure 128/61, pulse 100, temperature (!) 97.5 F (36.4 C), temperature source Oral, resp. rate 20, SpO2 100%.   Vitals  labs and radiology finding personally reviewed  Review of Systems:    Pertinent positives include: ***  Constitutional:  No weight loss, night sweats, Fevers, chills, fatigue, weight loss  HEENT:  No headaches, Difficulty swallowing,Tooth/dental problems,Sore throat,  No sneezing, itching, ear ache, nasal congestion, post nasal drip,  Cardio-vascular:  No chest pain, Orthopnea, PND, anasarca, dizziness, palpitations.no Bilateral lower extremity swelling  GI:  No heartburn, indigestion, abdominal pain, nausea, vomiting, diarrhea, change in bowel habits, loss of appetite,  melena, blood in stool, hematemesis Resp:  no shortness of breath at rest. No dyspnea on exertion, No excess mucus, no productive cough, No non-productive cough, No coughing up of blood.No change in color of mucus.No wheezing. Skin:  no rash or lesions. No jaundice GU:  no dysuria, change in color of urine, no urgency or frequency. No straining to urinate.  No flank pain.  Musculoskeletal:  No joint pain or no joint swelling. No decreased range of motion. No back pain.  Psych:  No change in mood or affect. No depression or anxiety. No memory loss.  Neuro: no localizing neurological complaints, no tingling, no weakness, no double vision, no gait abnormality, no slurred speech, no confusion  All systems reviewed and apart from HOPI all are negative _______________________________________________________________________________________________ Past Medical History:   Past Medical  History:  Diagnosis Date  . Anxiety   . Arthritis   . Asthma   . Bipolar 1 disorder (HCC)   . Breast discharge   . Breast lump   . Breast pain   . CAD (coronary artery disease) 12/03/2022    o Cath 2008: no CAD   o CCTA 10/05/19: CAC score 49, 88th percentile, nonobstructive CAD (LM 1-24 mid and dist)  o Myoview 12/31/20: EF 45, no ischemia or infarction, low risk   . Chronic pain    on MS Contin and oxycodone  . COPD (chronic obstructive pulmonary disease) (HCC)   . Depression   . Diabetes mellitus    diet and exercise controlled  . Dysrhythmia    HR in 30s on metoprolol  . Escherichia coli (E. coli) infection   . Fever   . GERD (gastroesophageal reflux disease)   . History of chest pain   . History of knee replacement, total   . Hypertension   . Hypothyroidism   . N&V (nausea and vomiting)   . Peripheral neuropathy    Right foot no sensation  . Sciatica   . Sleep apnea    does not use CPAP  . Thyroid disease   . Wears glasses       Past Surgical History:  Procedure Laterality Date  .  ABDOMINAL HYSTERECTOMY     partial  . APPENDECTOMY    . BIOPSY  03/14/2018   Procedure: BIOPSY;  Surgeon: Charlott Rakes, MD;  Location: [facility];  Service: Endoscopy;;  . BREAST DUCTAL SYSTEM EXCISION Right 08/31/2017   Procedure: RIGHT BREAST CENTRAL DUCT EXCISION;  Surgeon: Harriette Bouillon, MD;  Location: [facility];  Service: General;  Laterality: Right;  . BREAST EXCISIONAL BIOPSY Right   . BREAST LUMPECTOMY     right  . CARDIAC CATHETERIZATION     no significant CAD, nl LV function by 11/08/06 cath  . CESAREAN SECTION     x4  . COLONOSCOPY WITH PROPOFOL N/A 03/14/2018   Procedure: COLONOSCOPY WITH PROPOFOL;  Surgeon: Charlott Rakes, MD;  Location: [facility];  Service: Endoscopy;  Laterality: N/A;  . GASTRIC ROUX-EN-Y N/A 11/25/2014   Procedure: LAPAROSCOPIC ROUX-EN-Y GASTRIC BYPASS WITH UPPER ENDOSCOPY;  Surgeon: Luretha Murphy, MD;  Location: [facility];  Service: General;  Laterality: N/A;  . HERNIA REPAIR     left inguinal  . JOINT REPLACEMENT    . KNEE SURGERY     right knee  . MOUTH SURGERY    . MYOMECTOMY ABDOMINAL APPROACH  1989  . POLYPECTOMY  03/14/2018   Procedure: POLYPECTOMY;  Surgeon: Charlott Rakes, MD;  Location: [facility];  Service: Endoscopy;;  . TOTAL KNEE REVISION  06/21/2012   Procedure: TOTAL KNEE REVISION;  Surgeon: Nadara Mustard, MD;  Location: [facility];  Service: Orthopedics;  Laterality: Right;  Revision Right Total Knee Arthroplasty    Social History:  Ambulatory *** independently cane, walker  wheelchair bound, bed bound     reports that she quit smoking about 4 years ago. Her smoking use included cigarettes. She started smoking about 47 years ago. She has a 11 pack-year smoking history. She has never used smokeless tobacco. She reports that she does not currently use alcohol. She reports that she does not use drugs.     Family History: *** Family History  Problem Relation Age of Onset  . Cancer Father         colon  . Stroke Father   . Heart disease  Father   . Diabetes Father   . Hypertension Father   . Depression Sister   . Hypertension Mother   . Breast cancer Neg Hx    ______________________________________________________________________________________________ Allergies: Allergies  Allergen Reactions  . Aspirin Other (See Comments), Shortness Of Breath and Anaphylaxis    Pt states she has had Toradol several times without any reactions  Other Reaction(s): Other (See Comments), respiratory distress  . Bee Venom Anaphylaxis  . Sulfa Antibiotics Anaphylaxis  . Evolocumab     Other Reaction(s): Unknown  . Other Other (See Comments)    No blood products.  Patient did  Request that only albumin or albumin-containing products may be administered     Prior to Admission medications   Medication Sig Start Date End Date Taking? Authorizing Provider  dapagliflozin propanediol (FARXIGA) 5 MG TABS tablet Take 1 tablet (5 mg total) by mouth daily before breakfast. 10/07/22  Yes Newlin, Enobong, MD  solifenacin (VESICARE) 5 MG tablet Take 1 tablet (5 mg total) by mouth daily. 10/07/22  Yes Newlin, Enobong, MD  albuterol (VENTOLIN HFA) 108 (90 Base) MCG/ACT inhaler INHALE 2 PUFFS INTO THE LUNGS EVERY 6 HOURS AS NEEDED FOR WHEEZING OR SHORTNESS OF BREATH 09/23/21   Hoy Register, MD  Alirocumab (PRALUENT) 150 MG/ML SOAJ INJECT 1 PEN INTO SKIN EVERY 14 DAYS. 12/03/22   Tereso Newcomer T, PA-C  ALPRAZolam Prudy Feeler) 1 MG tablet Take 1 mg by mouth 3 (three) times daily as needed for anxiety. 07/02/14   [provider]  amphetamine-dextroamphetamine (ADDERALL XR) 30 MG 24 hr capsule Take 30 mg by mouth daily.  03/10/18   [provider]  amphetamine-dextroamphetamine (ADDERALL) 20 MG tablet Take 20 mg by mouth every evening. 10/13/17   [provider]  Blood Glucose Monitoring Suppl (ACCU-CHEK GUIDE) w/Device KIT 1 kit by Does not apply route 3 (three) times daily. To check blood sugars  04/10/21   Hoy Register, MD  cetirizine (ZYRTEC) 10 MG tablet Take 1 tablet (10 mg total) by mouth daily. 04/07/21   Hoy Register, MD  Cyanocobalamin (VITAMIN B 12 PO) Take 1 tablet by mouth daily. Unsure of dose    [provider]  cyclobenzaprine (FLEXERIL) 10 MG tablet Take 1 tablet (10 mg total) by mouth 3 (three) times daily as needed for muscle spasms. 01/14/23   Tressie Stalker, MD  diclofenac Sodium (VOLTAREN) 1 % GEL Apply 2-4 g topically 2 (two) times daily as needed (wrist/knee pain). 11/23/21   Hoy Register, MD  docusate sodium (COLACE) 100 MG capsule Take 1 capsule (100 mg total) by mouth 2 (two) times daily. 01/14/23   Tressie Stalker, MD  doxepin (SINEQUAN) 75 MG capsule Take 75 mg by mouth at bedtime. 03/02/18   [provider]  fluticasone (FLONASE) 50 MCG/ACT nasal spray SHAKE LIQUID AND USE TWO SPRAYS IN EACH NOSTRIL DAILY 12/21/22   Hoy Register, MD  glucose blood (ACCU-CHEK GUIDE) test strip Use as instructed 04/10/21   Hoy Register, MD  hydrocortisone-pramoxine (ANALPRAM HC) 2.5-1 % rectal cream Place 1 application rectally 3 (three) times daily. 07/13/21   Hoy Register, MD  isosorbide mononitrate (IMDUR) 30 MG 24 hr tablet Take 1 tablet (30 mg total) by mouth daily. 12/11/20   Lyn Records, MD  Lancets (ACCU-CHEK MULTICLIX) lancets Use as instructed 04/10/21   Hoy Register, MD  levothyroxine (SYNTHROID) 150 MCG tablet TAKE 1 TABLET(150 MCG) BY MOUTH DAILY BEFORE BREAKFAST 05/10/22   Hoy Register, MD  linaclotide (LINZESS) 290 MCG CAPS capsule Take  290 mcg by mouth See admin instructions. Every other night    [provider]  metoprolol succinate (TOPROL XL) 25 MG 24 hr tablet Take 0.5 tablets (12.5 mg total) by mouth at bedtime. Patient not taking: Reported on 01/11/2023 01/03/23   Tereso Newcomer T, PA-C  morphine (MS CONTIN) 15 MG 12 hr tablet Take 1 tablet (15 mg total) by mouth every 12 (twelve) hours. 11/14/14   Jones Bales, NP   naloxone [facility]) nasal spray 4 mg/0.1 mL Place 1 spray into the nose as needed (overdose). 05/22/20   [provider]  nitroGLYCERIN (NITROSTAT) 0.4 MG SL tablet DISSOLVE 1 TABLET UNDER THE TONGUE EVERY 5 MINUTES AS NEEDED 12/03/22   Tereso Newcomer T, PA-C  Olmesartan-amLODIPine-HCTZ 40-5-25 MG TABS Take 1 tablet by mouth daily. 12/08/22   Hoy Register, MD  omeprazole (PRILOSEC) 40 MG capsule TAKE 1 CAPSULE(40 MG) BY MOUTH DAILY 01/22/20   Fulp, Cammie, MD  oxyCODONE-acetaminophen (PERCOCET/ROXICET) 5-325 MG tablet Take 1-2 tablets by mouth every 4 (four) hours as needed for moderate pain. 01/14/23   Tressie Stalker, MD  promethazine (PHENERGAN) 25 MG tablet Take 1 tablet (25 mg total) by mouth every 8 (eight) hours as needed for nausea or vomiting. 12/24/22   Delorse Lek, FNP  REXULTI 2 MG TABS tablet Take 2 mg by mouth daily.    [provider]  spironolactone (ALDACTONE) 25 MG tablet Take 0.5 tablets (12.5 mg total) by mouth daily. 12/08/22   Hoy Register, MD  umeclidinium-vilanterol (ANORO ELLIPTA) 62.5-25 MCG/INH AEPB Inhale 1 puff into the lungs daily. 04/17/19   Jetty Duhamel D, MD  vortioxetine HBr (TRINTELLIX) 20 MG TABS tablet Take 20 mg by mouth daily.    [provider]  escitalopram (LEXAPRO) 20 MG tablet Take 20 mg by mouth daily.  12/14/18  [provider]  LATUDA 120 MG TABS Take 120 mg by mouth at bedtime.  03/04/18 12/14/18  [provider]  LISINOPRIL PO Take by mouth daily.    09/06/11  [provider]  metoCLOPramide (REGLAN) 10 MG tablet Take 1 tablet (10 mg total) by mouth every 8 (eight) hours as needed for nausea. Patient not taking: Reported on 12/06/2019 06/30/18 05/12/20  Marily Memos, MD    ___________________________________________________________________________________________________ Physical Exam:    01/30/2023   11:20 PM 01/30/2023   11:15 PM 01/30/2023   11:10 PM  Vitals with BMI  Systolic 128 129 295   Diastolic 61 74 75  Pulse 100 99 100     1. General:  in No ***Acute distress***increased work of breathing ***complaining of severe pain****agitated * Chronically ill *well *cachectic *toxic acutely ill -appearing 2. Psychological: Alert and *** Oriented 3. Head/ENT:   Moist *** Dry Mucous Membranes                          Head Non traumatic, neck supple                          Normal *** Poor Dentition 4. SKIN: normal *** decreased Skin turgor,  Skin clean Dry and intact no rash    5. Heart: Regular rate and rhythm no*** Murmur, no Rub or gallop 6. Lungs: ***Clear to auscultation bilaterally, no wheezes or crackles   7. Abdomen: Soft, ***non-tender, Non distended *** obese ***bowel sounds present 8. Lower extremities: no clubbing, cyanosis, no ***edema 9. Neurologically Grossly intact, moving all 4 extremities equally ***  strength 5 out of 5 in all 4 extremities cranial nerves II through XII intact 10. MSK: Normal range of motion    Chart has been reviewed  ______________________________________________________________________________________________  Assessment/Plan  ***  Admitted for *** Gastrointestinal hemorrhage with melena ***  Acute blood loss anemia ***  Lactic acidosis ***  Acute kidney injury (HCC) ***    Present on Admission: **None**     No problem-specific Assessment & Plan notes found for this encounter.    Other plan as per orders.  DVT prophylaxis:  SCD *** Lovenox       Code Status:    Code Status: Prior FULL CODE *** DNR/DNI ***comfort care as per patient ***family  I had personally discussed CODE STATUS with patient and family*  ACP *** none has been reviewed ***   Family Communication:   Family not at  Bedside  plan of care was discussed on the phone with *** Son, Daughter, Wife, Husband, Sister, Brother , father, mother  Diet  Diet Orders (From admission, onward)     Start     Ordered   01/30/23 1849  Diet NPO time  specified  Diet effective now        01/30/23 1849            Disposition Plan:   *** likely will need placement for rehabilitation                          Back to current facility when stable                            To home once workup is complete and patient is stable  ***Following barriers for discharge:                             Chest pain *** Stroke *** work up is complete                            Electrolytes corrected                               Anemia corrected h/H stable                             Pain controlled with PO medications                               Afebrile, white count improving able to transition to PO antibiotics                             Will need to be able to tolerate PO                            Will likely need home health, home O2, set up                           Will need consultants to evaluate patient prior to discharge       Consult Orders  (From admission, onward)  Start     Ordered   01/30/23 2312  Consult to hospitalist  paged  Once       Provider:  (Not yet assigned)  Question Answer Comment  Place call to: [facility]   Reason for Consult Admit      01/30/23 2311                              ***Would benefit from PT/OT eval prior to DC  Ordered                   Swallow eval - SLP ordered                   Diabetes care coordinator                   Transition of care consulted                   Nutrition    consulted                  Wound care  consulted                   Palliative care    consulted                   Behavioral health  consulted                    Consults called: ***     Admission status:  ED Disposition     ED Disposition  Admit   Condition  --   Comment  The patient appears reasonably stabilized for admission considering the current resources, flow, and capabilities available in the ED at this time, and I doubt any other [facility] requiring further screening and/or  treatment in the ED prior to admission is  present.           Obs***  ***  inpatient     I Expect 2 midnight stay secondary to severity of patient's current illness need for inpatient interventions justified by the following: ***hemodynamic instability despite optimal treatment (tachycardia *hypotension * tachypnea *hypoxia, hypercapnia) * Severe lab/radiological/exam abnormalities including:     and extensive comorbidities including: *substance abuse  *Chronic pain *DM2  * CHF * CAD  * COPD/asthma *Morbid Obesity * CKD *dementia *liver disease *history of stroke with residual deficits *  malignancy, * sickle cell disease  History of amputation Chronic anticoagulation  That are currently affecting medical management.   I expect  patient to be hospitalized for 2 midnights requiring inpatient medical care.  Patient is at high risk for adverse outcome (such as loss of life or disability) if not treated.  Indication for inpatient stay as follows:  Severe change from baseline regarding mental status Hemodynamic instability despite maximal medical therapy,  ongoing suicidal ideations,  severe pain requiring acute inpatient management,  inability to maintain oral hydration   persistent chest pain despite medical management Need for operative/procedural  intervention New or worsening hypoxia   Need for IV antibiotics, IV fluids, IV rate controling medications, IV antihypertensives, IV pain medications, IV anticoagulation, need for biPAP    Level of care   *** tele  For 12H 24H     medical floor       progressive     stepdown   tele indefinitely please discontinue once patient no  longer qualifies COVID-19 Labs    Lab Results  Component Value Date   SARSCOV2NAA Not Detected 07/12/2019     Precautions: admitted as *** Covid Negative  ***asymptomatic screening protocol****PUI *** covid positive No active isolations ***If Covid PCR is negative  - please DC  precautions - would need additional investigation given very high risk for false native test result    Critical***  Patient is critically ill due to  hemodynamic instability * respiratory failure *severe sepsis* ongoing chest pain*  They are at high risk for life/limb threatening clinical deterioration requiring frequent reassessment and modifications of care.  Services provided include examination of the patient, review of relevant ancillary tests, prescription of lifesaving therapies, review of medications and prophylactic therapy.  Total critical care time excluding separately billable procedures: 60*  Minutes.    Therisa Doyne 01/30/2023, 11:37 PM ***  [facility]     after 2 AM please page floor coverage PA If 7AM-7PM, please contact the day team taking care of the patient using [email or web address]

## 2023-01-30 NOTE — ED Provider Notes (Signed)
[place] EMERGENCY DEPARTMENT AT [facility] Provider Note   CSN: [id number] Arrival date & time: 01/30/23  1808     History  Chief Complaint  Patient presents with   Seizures   Altered Mental Status    Caroline Williams is a 62 y.o. female with past medical history CAD, anxiety, asthma, bipolar disorder, COPD, type 2 diabetes, hypertension who presents to the ED complaining of left-sided abdominal pain, nausea, vomiting, diarrhea, and melena for the last 3 days.  Reports that initially she was having nonbloody vomiting and diarrhea but over approximately the last day she has had been having large amounts of black stools.  States this has happened before but it was many years ago.  On chart review, her last colonoscopy in 2019 had a benign polyp but was otherwise essentially normal.  She denies fever, chest pain, shortness of breath.  No hematemesis.  She denies taking blood thinners.  She denies alcohol use.      Home Medications Prior to Admission medications   Medication Sig Start Date End Date Taking? Authorizing Provider  dapagliflozin propanediol (FARXIGA) 5 MG TABS tablet Take 1 tablet (5 mg total) by mouth daily before breakfast. 10/07/22  Yes Newlin, Enobong, MD  solifenacin (VESICARE) 5 MG tablet Take 1 tablet (5 mg total) by mouth daily. 10/07/22  Yes Newlin, Enobong, MD  albuterol (VENTOLIN HFA) 108 (90 Base) MCG/ACT inhaler INHALE 2 PUFFS INTO THE LUNGS EVERY 6 HOURS AS NEEDED FOR WHEEZING OR SHORTNESS OF BREATH 09/23/21   Hoy Register, MD  Alirocumab (PRALUENT) 150 MG/ML SOAJ INJECT 1 PEN INTO SKIN EVERY 14 DAYS. 12/03/22   Tereso Newcomer T, PA-C  ALPRAZolam Prudy Feeler) 1 MG tablet Take 1 mg by mouth 3 (three) times daily as needed for anxiety. 07/02/14   [provider]  amphetamine-dextroamphetamine (ADDERALL XR) 30 MG 24 hr capsule Take 30 mg by mouth daily.  03/10/18   [provider]  amphetamine-dextroamphetamine (ADDERALL) 20 MG tablet Take 20 mg  by mouth every evening. 10/13/17   [provider]  Blood Glucose Monitoring Suppl (ACCU-CHEK GUIDE) w/Device KIT 1 kit by Does not apply route 3 (three) times daily. To check blood sugars 04/10/21   Hoy Register, MD  cetirizine (ZYRTEC) 10 MG tablet Take 1 tablet (10 mg total) by mouth daily. 04/07/21   Hoy Register, MD  Cyanocobalamin (VITAMIN B 12 PO) Take 1 tablet by mouth daily. Unsure of dose    [provider]  cyclobenzaprine (FLEXERIL) 10 MG tablet Take 1 tablet (10 mg total) by mouth 3 (three) times daily as needed for muscle spasms. 01/14/23   Tressie Stalker, MD  diclofenac Sodium (VOLTAREN) 1 % GEL Apply 2-4 g topically 2 (two) times daily as needed (wrist/knee pain). 11/23/21   Hoy Register, MD  docusate sodium (COLACE) 100 MG capsule Take 1 capsule (100 mg total) by mouth 2 (two) times daily. 01/14/23   Tressie Stalker, MD  doxepin (SINEQUAN) 75 MG capsule Take 75 mg by mouth at bedtime. 03/02/18   [provider]  fluticasone (FLONASE) 50 MCG/ACT nasal spray SHAKE LIQUID AND USE TWO SPRAYS IN EACH NOSTRIL DAILY 12/21/22   Hoy Register, MD  glucose blood (ACCU-CHEK GUIDE) test strip Use as instructed 04/10/21   Hoy Register, MD  hydrocortisone-pramoxine (ANALPRAM HC) 2.5-1 % rectal cream Place 1 application rectally 3 (three) times daily. 07/13/21   Hoy Register, MD  isosorbide mononitrate (IMDUR) 30 MG 24 hr tablet Take 1 tablet (30 mg total)  by mouth daily. 12/11/20   Lyn Records, MD  Lancets (ACCU-CHEK MULTICLIX) lancets Use as instructed 04/10/21   Hoy Register, MD  levothyroxine (SYNTHROID) 150 MCG tablet TAKE 1 TABLET(150 MCG) BY MOUTH DAILY BEFORE BREAKFAST 05/10/22   Hoy Register, MD  linaclotide (LINZESS) 290 MCG CAPS capsule Take 290 mcg by mouth See admin instructions. Every other night    [provider]  metoprolol succinate (TOPROL XL) 25 MG 24 hr tablet Take 0.5 tablets (12.5 mg total) by mouth at bedtime. Patient not  taking: Reported on 01/11/2023 01/03/23   Tereso Newcomer T, PA-C  morphine (MS CONTIN) 15 MG 12 hr tablet Take 1 tablet (15 mg total) by mouth every 12 (twelve) hours. 11/14/14   Jones Bales, NP  naloxone [facility]) nasal spray 4 mg/0.1 mL Place 1 spray into the nose as needed (overdose). 05/22/20   [provider]  nitroGLYCERIN (NITROSTAT) 0.4 MG SL tablet DISSOLVE 1 TABLET UNDER THE TONGUE EVERY 5 MINUTES AS NEEDED 12/03/22   Tereso Newcomer T, PA-C  Olmesartan-amLODIPine-HCTZ 40-5-25 MG TABS Take 1 tablet by mouth daily. 12/08/22   Hoy Register, MD  omeprazole (PRILOSEC) 40 MG capsule TAKE 1 CAPSULE(40 MG) BY MOUTH DAILY 01/22/20   Fulp, Cammie, MD  oxyCODONE-acetaminophen (PERCOCET/ROXICET) 5-325 MG tablet Take 1-2 tablets by mouth every 4 (four) hours as needed for moderate pain. 01/14/23   Tressie Stalker, MD  promethazine (PHENERGAN) 25 MG tablet Take 1 tablet (25 mg total) by mouth every 8 (eight) hours as needed for nausea or vomiting. 12/24/22   Delorse Lek, FNP  REXULTI 2 MG TABS tablet Take 2 mg by mouth daily.    [provider]  spironolactone (ALDACTONE) 25 MG tablet Take 0.5 tablets (12.5 mg total) by mouth daily. 12/08/22   Hoy Register, MD  umeclidinium-vilanterol (ANORO ELLIPTA) 62.5-25 MCG/INH AEPB Inhale 1 puff into the lungs daily. 04/17/19   Jetty Duhamel D, MD  vortioxetine HBr (TRINTELLIX) 20 MG TABS tablet Take 20 mg by mouth daily.    [provider]  escitalopram (LEXAPRO) 20 MG tablet Take 20 mg by mouth daily.  12/14/18  [provider]  LATUDA 120 MG TABS Take 120 mg by mouth at bedtime.  03/04/18 12/14/18  [provider]  LISINOPRIL PO Take by mouth daily.    09/06/11  [provider]  metoCLOPramide (REGLAN) 10 MG tablet Take 1 tablet (10 mg total) by mouth every 8 (eight) hours as needed for nausea. Patient not taking: Reported on 12/06/2019 06/30/18 05/12/20  Mesner, Barbara Cower, MD      Allergies    Aspirin, Bee venom,  Sulfa antibiotics, Evolocumab, and Other    Review of Systems   Review of Systems  Unable to perform ROS: Acuity of condition    Physical Exam Updated Vital Signs BP (!) 129/51   Pulse 100   Temp (!) 97.5 F (36.4 C) (Oral)   Resp 20   SpO2 99%  Physical Exam Vitals and nursing note reviewed. Exam conducted with a chaperone present.  Constitutional:      Appearance: She is ill-appearing. She is not diaphoretic.  HENT:     Head: Normocephalic and atraumatic.     Mouth/Throat:     Mouth: Mucous membranes are dry.  Eyes:     Extraocular Movements: Extraocular movements intact.     Pupils: Pupils are equal, round, and reactive to light.     Comments: Pale conjunctival bilaterally  Cardiovascular:     Rate and  Rhythm: Normal rate and regular rhythm.  Pulmonary:     Effort: Pulmonary effort is normal.     Breath sounds: Normal breath sounds.  Abdominal:     General: There is distension (slight diffuse).     Tenderness: There is abdominal tenderness (diffuse L sided). There is no right CVA tenderness, left CVA tenderness, guarding or rebound.  Genitourinary:    Comments: Rectal exam completed with primary RN at bedside, large amounts of grossly positive melena Musculoskeletal:     Cervical back: Normal range of motion and neck supple.     Comments: Generally weak, able to change positions on the stretcher with one-person assist, moving all extremities equally and spontaneously x 4, no lower extremity edema  Skin:    General: Skin is warm and dry.     Findings: No rash.  Neurological:     General: No focal deficit present.     Mental Status: She is alert and oriented to person, place, and time.  Psychiatric:        Mood and Affect: Mood is anxious. Affect is tearful.     ED Results / Procedures / Treatments   Labs (all labs ordered are listed, but only abnormal results are displayed) Labs Reviewed  CBC - Abnormal; Notable for the following components:      Result Value    WBC 13.9 (*)    RBC 1.09 (*)    Hemoglobin 3.0 (*)    HCT 10.5 (*)    MCHC 28.6 (*)    RDW 17.6 (*)    nRBC 1.9 (*)    All other components within normal limits  BASIC METABOLIC PANEL - Abnormal; Notable for the following components:   Sodium 133 (*)    CO2 8 (*)    Glucose, Bld 140 (*)    BUN 68 (*)    Creatinine, Ser 2.61 (*)    Calcium 7.8 (*)    GFR, Estimated 20 (*)    Anion gap 27 (*)    All other components within normal limits  HEPATIC FUNCTION PANEL - Abnormal; Notable for the following components:   Total Protein 6.0 (*)    Albumin 2.9 (*)    AST 52 (*)    All other components within normal limits  SALICYLATE LEVEL - Abnormal; Notable for the following components:   Salicylate Lvl <7.0 (*)    All other components within normal limits  ACETAMINOPHEN LEVEL - Abnormal; Notable for the following components:   Acetaminophen (Tylenol), Serum <10 (*)    All other components within normal limits  LIPASE, BLOOD - Abnormal; Notable for the following components:   Lipase 73 (*)    All other components within normal limits  CBC WITH DIFFERENTIAL/PLATELET - Abnormal; Notable for the following components:   WBC 13.8 (*)    RBC 1.01 (*)    Hemoglobin 3.0 (*)    HCT 10.0 (*)    RDW 17.9 (*)    nRBC 1.8 (*)    Neutro Abs 11.4 (*)    Abs Immature Granulocytes 0.12 (*)    All other components within normal limits  OCCULT BLOOD X 1 CARD TO LAB, STOOL - Abnormal; Notable for the following components:   Fecal Occult Bld POSITIVE (*)    All other components within normal limits  CBG MONITORING, ED - Abnormal; Notable for the following components:   Glucose-Capillary 121 (*)    All other components within normal limits  I-STAT CG4 LACTIC ACID, ED - Abnormal;  Notable for the following components:   Lactic Acid, Venous 14.0 (*)    All other components within normal limits  I-STAT CG4 LACTIC ACID, ED - Abnormal; Notable for the following components:   Lactic Acid, Venous 11.2  (*)    All other components within normal limits  CULTURE, BLOOD (ROUTINE X 2)  CULTURE, BLOOD (ROUTINE X 2)  ETHANOL  RAPID URINE DRUG SCREEN, HOSP PERFORMED  URINALYSIS, ROUTINE W REFLEX MICROSCOPIC  DIC (DISSEMINATED INTRAVASCULAR COAGULATION)PANEL  I-STAT VENOUS BLOOD GAS, ED  I-STAT CHEM 8, ED  POC OCCULT BLOOD, ED  TYPE AND SCREEN  PREPARE RBC (CROSSMATCH)  ABO/RH    EKG None  Radiology CT ANGIO GI BLEED  Result Date: 01/30/2023 CLINICAL DATA:  Bloody diarrhea. Concern for acute GI bleed or mesenteric ischemia. EXAM: CTA ABDOMEN AND PELVIS WITHOUT AND WITH CONTRAST TECHNIQUE: Multidetector CT imaging of the abdomen and pelvis was performed using the standard protocol during bolus administration of intravenous contrast. Multiplanar reconstructed images and MIPs were obtained and reviewed to evaluate the vascular anatomy. RADIATION DOSE REDUCTION: This exam was performed according to the departmental dose-optimization program which includes automated exposure control, adjustment of the mA and/or kV according to patient size and/or use of iterative reconstruction technique. CONTRAST:  75mL OMNIPAQUE IOHEXOL 350 MG/ML SOLN COMPARISON:  09/10/2021 FINDINGS: VASCULAR Aorta: Normal caliber aorta without aneurysm, dissection, vasculitis or significant stenosis. Scattered aortic calcifications. Celiac: Patent without evidence of aneurysm, dissection, vasculitis or significant stenosis. SMA: Patent without evidence of aneurysm, dissection, vasculitis or significant stenosis. Renals: Both renal arteries are patent without evidence of aneurysm, dissection, vasculitis, fibromuscular dysplasia or significant stenosis. IMA: Patent without evidence of aneurysm, dissection, vasculitis or significant stenosis. Inflow: Atherosclerotic calcifications. No aneurysm or dissection. No stenosis. Proximal Outflow: Bilateral common femoral and visualized portions of the superficial and profunda femoral arteries are  patent without evidence of aneurysm, dissection, vasculitis or significant stenosis. Veins: No obvious venous abnormality within the limitations of this arterial phase study. Review of the MIP images confirms the above findings. NON-VASCULAR Lower chest: No acute findings Hepatobiliary: Stable left hepatic cyst which appears benign. No suspicious focal hepatic abnormality. Gallbladder unremarkable. Pancreas: No focal abnormality or ductal dilatation. Spleen: No focal abnormality.  Normal size. Adrenals/Urinary Tract: Diffuse enlargement of the left adrenal gland. Right adrenal nodule is unchanged. No renal or ureteral stones. No renal or ureteral stones. No hydronephrosis. Urinary bladder unremarkable. Stomach/Bowel: High-density material throughout the colon making it difficult to assess for contrast extravasation. No obvious contrast extravasation. Moderate stool burden throughout the colon. No abnormal wall thickening to suggest ischemia. No evidence of bowel obstruction. Changes of gastric bypass. Lymphatic: No adenopathy Reproductive: Prior hysterectomy.  No adnexal masses. Other: No free fluid or free air. Musculoskeletal: Postoperative changes in the lumbar spine. Fluid collection in the midline of the back in the subcutaneous soft tissues measuring 5.1 x 3.7 cm compatible with postoperative fluid collection. No acute bony abnormality. IMPRESSION: VASCULAR Scattered aortoiliac atherosclerosis. No significant stenosis, aneurysm or dissection. Difficult to assess for GI bleed within the colon due to ingested high-density material throughout the colon. No obvious or large GI bleed visualized. NON-VASCULAR No changes of mesenteric ischemia noted. Mild gaseous distention of the colon moderate stool burden. Electronically Signed   By: Charlett Nose M.D.   On: 01/30/2023 22:19   DG Chest Port 1 View  Result Date: 01/30/2023 CLINICAL DATA:  Altered mental status EXAM: PORTABLE CHEST 1 VIEW COMPARISON:  09/09/2021  FINDINGS: Low volume AP  portable examination. Cardiomegaly. Both lungs are clear. The visualized skeletal structures are unremarkable. IMPRESSION: Low volume AP portable examination. Cardiomegaly without acute abnormality of the lungs. Electronically Signed   By: Jearld Lesch M.D.   On: 01/30/2023 20:06    Procedures .Critical Care  Performed by: Tonette Lederer, PA-C Authorized by: Tonette Lederer, PA-C   Critical care provider statement:    Critical care time (minutes):  75   Critical care start time:  01/30/2023 6:20 PM   Critical care end time:  01/30/2023 11:03 PM   Critical care time was exclusive of:  Separately billable procedures and treating other patients and teaching time   Critical care was necessary to treat or prevent imminent or life-threatening deterioration of the following conditions:  Cardiac failure, renal failure, respiratory failure, sepsis, circulatory failure, dehydration, shock and metabolic crisis   Critical care was time spent personally by me on the following activities:  Blood draw for specimens, ordering and performing treatments and interventions, ordering and review of laboratory studies, ordering and review of radiographic studies, pulse oximetry, re-evaluation of patient's condition, discussions with consultants, development of treatment plan with patient or surrogate, evaluation of patient's response to treatment, examination of patient, obtaining history from patient or surrogate, interpretation of cardiac output measurements and review of old charts   Care discussed with: admitting provider       Medications Ordered in ED Medications  LORazepam (ATIVAN) injection 1 mg (1 mg Intravenous Given 01/30/23 2012)  pantoprazole (PROTONIX) injection 40 mg (40 mg Intravenous Given 01/30/23 2014)  lactated ringers bolus 1,000 mL (1,000 mLs Intravenous New Bag/Given 01/30/23 2024)  0.9 %  sodium chloride infusion (Manually program via Guardrails IV Fluids) ( Intravenous  New Bag/Given 01/30/23 2057)  pantoprazole (PROTONIX) injection 40 mg (40 mg Intravenous Given 01/30/23 2242)  iohexol (OMNIPAQUE) 350 MG/ML injection 75 mL (75 mLs Intravenous Contrast Given 01/30/23 2208)    ED Course/ Medical Decision Making/ A&P Clinical Course as of 01/30/23 2302  Sun Jan 30, 2023  2003 Multiple people in the room for discussion about blood product receipt.  Will recheck hemoglobin first however if below 7 patient is willing to receive blood products as a lifesaving measure.  3 days of bloody diarrhea is the likely etiology. [CC]    Clinical Course User Index [CC] Glyn Ade, MD                                 Medical Decision Making Amount and/or Complexity of Data Reviewed Labs: ordered. Decision-making details documented in ED Course. Radiology: ordered. Decision-making details documented in ED Course. ECG/medicine tests: ordered. Decision-making details documented in ED Course.  Risk Prescription drug management. Decision regarding hospitalization.   Medical Decision Making:   JAYNA GILFILLAN is a 62 y.o. female who presented to the ED today with abdominal pain detailed above.    Patient's presentation is complicated by their history of age, HTN, HLD, active GI bleed.  Patient placed on continuous vitals and telemetry monitoring while in ED which was reviewed periodically.  Complete initial physical exam performed, notably the patient  was ill appearing, pale and with L sided abdominal tenderness and distension. Gross melena on rectal exam. Neurologically intact.    Reviewed and confirmed nursing documentation for past medical history, family history, social history.    Initial Assessment:   With the patient's presentation of abdominal pain, differential diagnosis includes but is not  limited to AAA, mesenteric ischemia, appendicitis, diverticulitis, DKA, gastritis, gastroenteritis, AMI, nephrolithiasis, pancreatitis, peritonitis, adrenal insufficiency,  intestinal ischemia, constipation, UTI, SBO/LBO, splenic rupture, biliary disease, IBD, IBS, PUD, hepatitis, STD, ovarian/testicular torsion, electrolyte disturbance, DKA, dehydration, acute kidney injury, renal failure, cholecystitis, cholelithiasis, choledocholithiasis, abdominal pain of  unknown etiology, upper/lower GI bleed.   Initial Plan:  Screening labs including CBC and Metabolic panel to evaluate for infectious or metabolic etiology of disease.  Lipase to evaluate for pancreatitis Urinalysis with reflex culture ordered to evaluate for UTI or relevant urologic/nephrologic pathology.  CT abd/pelvis GI study to evaluate for intra-abdominal pathology Screening labs for altered mental status reported by family CXR to assess for intra-thoracic pathology EKG to evaluate for cardiac pathology Symptomatic management Objective evaluation as reviewed   Initial Study Results:   Laboratory  All laboratory results reviewed without evidence of clinically relevant pathology.   Exceptions include: bicarb 8, GFR 20, Hgb 3, WBC 13.9, lactic 14  EKG EKG was reviewed independently. NSR.   Radiology:  All images reviewed independently. Agree with radiology report at this time.   CT ANGIO GI BLEED  Result Date: 01/30/2023 CLINICAL DATA:  Bloody diarrhea. Concern for acute GI bleed or mesenteric ischemia. EXAM: CTA ABDOMEN AND PELVIS WITHOUT AND WITH CONTRAST TECHNIQUE: Multidetector CT imaging of the abdomen and pelvis was performed using the standard protocol during bolus administration of intravenous contrast. Multiplanar reconstructed images and MIPs were obtained and reviewed to evaluate the vascular anatomy. RADIATION DOSE REDUCTION: This exam was performed according to the departmental dose-optimization program which includes automated exposure control, adjustment of the mA and/or kV according to patient size and/or use of iterative reconstruction technique. CONTRAST:  75mL OMNIPAQUE IOHEXOL 350  MG/ML SOLN COMPARISON:  09/10/2021 FINDINGS: VASCULAR Aorta: Normal caliber aorta without aneurysm, dissection, vasculitis or significant stenosis. Scattered aortic calcifications. Celiac: Patent without evidence of aneurysm, dissection, vasculitis or significant stenosis. SMA: Patent without evidence of aneurysm, dissection, vasculitis or significant stenosis. Renals: Both renal arteries are patent without evidence of aneurysm, dissection, vasculitis, fibromuscular dysplasia or significant stenosis. IMA: Patent without evidence of aneurysm, dissection, vasculitis or significant stenosis. Inflow: Atherosclerotic calcifications. No aneurysm or dissection. No stenosis. Proximal Outflow: Bilateral common femoral and visualized portions of the superficial and profunda femoral arteries are patent without evidence of aneurysm, dissection, vasculitis or significant stenosis. Veins: No obvious venous abnormality within the limitations of this arterial phase study. Review of the MIP images confirms the above findings. NON-VASCULAR Lower chest: No acute findings Hepatobiliary: Stable left hepatic cyst which appears benign. No suspicious focal hepatic abnormality. Gallbladder unremarkable. Pancreas: No focal abnormality or ductal dilatation. Spleen: No focal abnormality.  Normal size. Adrenals/Urinary Tract: Diffuse enlargement of the left adrenal gland. Right adrenal nodule is unchanged. No renal or ureteral stones. No renal or ureteral stones. No hydronephrosis. Urinary bladder unremarkable. Stomach/Bowel: High-density material throughout the colon making it difficult to assess for contrast extravasation. No obvious contrast extravasation. Moderate stool burden throughout the colon. No abnormal wall thickening to suggest ischemia. No evidence of bowel obstruction. Changes of gastric bypass. Lymphatic: No adenopathy Reproductive: Prior hysterectomy.  No adnexal masses. Other: No free fluid or free air. Musculoskeletal:  Postoperative changes in the lumbar spine. Fluid collection in the midline of the back in the subcutaneous soft tissues measuring 5.1 x 3.7 cm compatible with postoperative fluid collection. No acute bony abnormality. IMPRESSION: VASCULAR Scattered aortoiliac atherosclerosis. No significant stenosis, aneurysm or dissection. Difficult to assess for GI bleed within the colon due to  ingested high-density material throughout the colon. No obvious or large GI bleed visualized. NON-VASCULAR No changes of mesenteric ischemia noted. Mild gaseous distention of the colon moderate stool burden. Electronically Signed   By: Charlett Nose M.D.   On: 01/30/2023 22:19   DG Chest Port 1 View  Result Date: 01/30/2023 CLINICAL DATA:  Altered mental status EXAM: PORTABLE CHEST 1 VIEW COMPARISON:  09/09/2021 FINDINGS: Low volume AP portable examination. Cardiomegaly. Both lungs are clear. The visualized skeletal structures are unremarkable. IMPRESSION: Low volume AP portable examination. Cardiomegaly without acute abnormality of the lungs. Electronically Signed   By: Jearld Lesch M.D.   On: 01/30/2023 20:06   DG Lumbar Spine 2-3 Views  Result Date: 01/12/2023 CLINICAL DATA:  Elective surgery. EXAM: LUMBAR SPINE - 2-3 VIEW COMPARISON:  None Available. FINDINGS: Two fluoroscopic spot views of the lumbar spine obtained in the operating room. Pedicle screws at L4 and L5 with interbody spacer. Fluoroscopy time 16 seconds. Dose 24.47 mGy. IMPRESSION: Intraoperative fluoroscopy during lumbar fusion. Electronically Signed   By: Narda Rutherford M.D.   On: 01/12/2023 14:32   DG Lumbar Spine 1 View  Result Date: 01/12/2023 CLINICAL DATA:  Elective surgery, intraop for localization. EXAM: LUMBAR SPINE - 1 VIEW COMPARISON:  Preoperative radiograph 06/11/2022 FINDINGS: Portable cross-table lateral view of the lumbar spine obtained in the operating room. Surgical instrument localizes posteriorly at the L4 level. IMPRESSION: Surgical  instrument localizes posteriorly at the L4 level. Electronically Signed   By: Narda Rutherford M.D.   On: 01/12/2023 12:28   DG C-Arm 1-60 Min-No Report  Result Date: 01/12/2023 Fluoroscopy was utilized by the requesting physician.  No radiographic interpretation.      Consults: Case discussed with Dr. Dulce Sellar with GI who agreed with management and will follow.  Case discussed with Dr. Judeth Horn with critical care who will come see.  After examining patient, ICU team recommended 2 more units of blood and patient can be admitted to hospital medicine team.  Blood pressure is improving.  Patient alert and oriented, answering questions appropriately.  Discussed with hospital medicine team who will admit.  Urine has now resulted and does have a small amount of white blood cells.  Will go ahead and treat with a gram of Rocephin.  Blood cultures previously ordered.  Final Assessment and Plan:   62 year old female presents to ED with L sided abdominal pain, vomiting, melena. Family called EMS for AMS, concern for seizure like activity. Reportedly pt shaking all over like she has chills. Pt denies fever at home. On initial exam, she is pale, ill appearing. Reports large amounts of melena over last couple of days. Normotensive but on lower end of normal. Large amounts of melena on rectal exam. Initial Hgb resulted at 3. Last a few weeks ago was 11. Repeated to confirm and remained at 3. Extensive discussion with pt regarding risks/benefits of receiving blood. Pt is Jehovah's witness. Initially hesitant but eventually was agreeable on multiple repeat discussion with myself, attending, RN to proceed with blood transfusion. Emergent transfusion of 2 units initiated. Pt also received fluid bolus. Episodes of slowed mentation but pt did remain alert and oriented. Blood pressure stable though prior to receiving all blood products again on lower end. Given 80mg  total Protonix. CXR normal. High anion gap, lactic. Some  concern for mesenteric ischemia as well. CT GI study was ordered. No obvious significant findings on this study. With hemoglobin 3, consulted critical care for admission and recommendations.  I evaluated  patient.  She is maintaining mentation and blood pressure so they recommended additional 2 units of blood and can consult as needed but at this time patient stable for admission to hospital medicine.  Discussed as above. Pt admitted, agreeable with plan at this time, at time of admission alert and oriented, easily arousable, conversational, and vital signs stable.    Clinical Impression:  1. Gastrointestinal hemorrhage with melena   2. Acute blood loss anemia   3. Lactic acidosis   4. Acute kidney injury (HCC)      Admit        Final Clinical Impression(s) / ED Diagnoses Final diagnoses:  Gastrointestinal hemorrhage with melena  Acute blood loss anemia  Lactic acidosis  Acute kidney injury [facility])    Rx / DC Orders ED Discharge Orders     None         Tonette Lederer, PA-C 01/30/23 2354    Glyn Ade, MD 01/31/23 1502

## 2023-01-30 NOTE — Subjective & Objective (Signed)
Presents with left-sided abdominal pain nausea vomiting diarrhea also have been having nonbloody vomiting but now having large amount of black stools 2019 she had a benign polyp no fevers no chills no hematemesis not on any blood thinners does not drink alcohol

## 2023-01-31 ENCOUNTER — Inpatient Hospital Stay (HOSPITAL_COMMUNITY): Payer: Medicare HMO | Admitting: Certified Registered"

## 2023-01-31 ENCOUNTER — Encounter (HOSPITAL_COMMUNITY)
Admission: EM | Disposition: A | Discharge status: Home Health | Payer: Self-pay | Source: Home / Self Care | Attending: Internal Medicine

## 2023-01-31 DIAGNOSIS — E7800 Pure hypercholesterolemia, unspecified: Secondary | ICD-10-CM | POA: Diagnosis present

## 2023-01-31 DIAGNOSIS — K921 Melena: Secondary | ICD-10-CM | POA: Diagnosis not present

## 2023-01-31 DIAGNOSIS — Q600 Renal agenesis, unilateral: Secondary | ICD-10-CM | POA: Diagnosis not present

## 2023-01-31 DIAGNOSIS — Z6839 Body mass index (BMI) 39.0-39.9, adult: Secondary | ICD-10-CM | POA: Diagnosis not present

## 2023-01-31 DIAGNOSIS — E1165 Type 2 diabetes mellitus with hyperglycemia: Secondary | ICD-10-CM | POA: Diagnosis present

## 2023-01-31 DIAGNOSIS — E872 Acidosis, unspecified: Secondary | ICD-10-CM | POA: Diagnosis present

## 2023-01-31 DIAGNOSIS — E038 Other specified hypothyroidism: Secondary | ICD-10-CM | POA: Diagnosis not present

## 2023-01-31 DIAGNOSIS — Z87891 Personal history of nicotine dependence: Secondary | ICD-10-CM

## 2023-01-31 DIAGNOSIS — F319 Bipolar disorder, unspecified: Secondary | ICD-10-CM | POA: Diagnosis present

## 2023-01-31 DIAGNOSIS — K449 Diaphragmatic hernia without obstruction or gangrene: Secondary | ICD-10-CM

## 2023-01-31 DIAGNOSIS — K922 Gastrointestinal hemorrhage, unspecified: Secondary | ICD-10-CM | POA: Diagnosis present

## 2023-01-31 DIAGNOSIS — K5150 Left sided colitis without complications: Secondary | ICD-10-CM | POA: Diagnosis not present

## 2023-01-31 DIAGNOSIS — I25119 Atherosclerotic heart disease of native coronary artery with unspecified angina pectoris: Secondary | ICD-10-CM | POA: Diagnosis not present

## 2023-01-31 DIAGNOSIS — R109 Unspecified abdominal pain: Secondary | ICD-10-CM | POA: Diagnosis not present

## 2023-01-31 DIAGNOSIS — K9189 Other postprocedural complications and disorders of digestive system: Secondary | ICD-10-CM | POA: Diagnosis not present

## 2023-01-31 DIAGNOSIS — K56609 Unspecified intestinal obstruction, unspecified as to partial versus complete obstruction: Secondary | ICD-10-CM | POA: Diagnosis not present

## 2023-01-31 DIAGNOSIS — N390 Urinary tract infection, site not specified: Secondary | ICD-10-CM | POA: Diagnosis present

## 2023-01-31 DIAGNOSIS — N183 Chronic kidney disease, stage 3 unspecified: Secondary | ICD-10-CM | POA: Diagnosis not present

## 2023-01-31 DIAGNOSIS — I129 Hypertensive chronic kidney disease with stage 1 through stage 4 chronic kidney disease, or unspecified chronic kidney disease: Secondary | ICD-10-CM

## 2023-01-31 DIAGNOSIS — N179 Acute kidney failure, unspecified: Secondary | ICD-10-CM | POA: Diagnosis present

## 2023-01-31 DIAGNOSIS — K6389 Other specified diseases of intestine: Secondary | ICD-10-CM | POA: Diagnosis not present

## 2023-01-31 DIAGNOSIS — R578 Other shock: Secondary | ICD-10-CM

## 2023-01-31 DIAGNOSIS — I2510 Atherosclerotic heart disease of native coronary artery without angina pectoris: Secondary | ICD-10-CM

## 2023-01-31 DIAGNOSIS — R188 Other ascites: Secondary | ICD-10-CM | POA: Diagnosis not present

## 2023-01-31 DIAGNOSIS — F419 Anxiety disorder, unspecified: Secondary | ICD-10-CM | POA: Diagnosis present

## 2023-01-31 DIAGNOSIS — E876 Hypokalemia: Secondary | ICD-10-CM | POA: Diagnosis present

## 2023-01-31 DIAGNOSIS — K529 Noninfective gastroenteritis and colitis, unspecified: Secondary | ICD-10-CM | POA: Diagnosis not present

## 2023-01-31 DIAGNOSIS — K55032 Diffuse acute (reversible) ischemia of large intestine: Secondary | ICD-10-CM | POA: Diagnosis present

## 2023-01-31 DIAGNOSIS — D631 Anemia in chronic kidney disease: Secondary | ICD-10-CM | POA: Diagnosis present

## 2023-01-31 DIAGNOSIS — R5381 Other malaise: Secondary | ICD-10-CM | POA: Diagnosis present

## 2023-01-31 DIAGNOSIS — E1122 Type 2 diabetes mellitus with diabetic chronic kidney disease: Secondary | ICD-10-CM | POA: Diagnosis present

## 2023-01-31 DIAGNOSIS — E1140 Type 2 diabetes mellitus with diabetic neuropathy, unspecified: Secondary | ICD-10-CM | POA: Diagnosis not present

## 2023-01-31 DIAGNOSIS — G9341 Metabolic encephalopathy: Secondary | ICD-10-CM

## 2023-01-31 DIAGNOSIS — K633 Ulcer of intestine: Secondary | ICD-10-CM | POA: Diagnosis not present

## 2023-01-31 DIAGNOSIS — D62 Acute posthemorrhagic anemia: Secondary | ICD-10-CM

## 2023-01-31 DIAGNOSIS — K55039 Acute (reversible) ischemia of large intestine, extent unspecified: Secondary | ICD-10-CM | POA: Diagnosis not present

## 2023-01-31 DIAGNOSIS — K559 Vascular disorder of intestine, unspecified: Secondary | ICD-10-CM | POA: Diagnosis not present

## 2023-01-31 DIAGNOSIS — R918 Other nonspecific abnormal finding of lung field: Secondary | ICD-10-CM | POA: Diagnosis not present

## 2023-01-31 DIAGNOSIS — E039 Hypothyroidism, unspecified: Secondary | ICD-10-CM | POA: Diagnosis present

## 2023-01-31 DIAGNOSIS — Z90710 Acquired absence of both cervix and uterus: Secondary | ICD-10-CM | POA: Diagnosis not present

## 2023-01-31 DIAGNOSIS — I10 Essential (primary) hypertension: Secondary | ICD-10-CM | POA: Diagnosis not present

## 2023-01-31 DIAGNOSIS — J4489 Other specified chronic obstructive pulmonary disease: Secondary | ICD-10-CM | POA: Diagnosis present

## 2023-01-31 DIAGNOSIS — E1142 Type 2 diabetes mellitus with diabetic polyneuropathy: Secondary | ICD-10-CM | POA: Diagnosis present

## 2023-01-31 DIAGNOSIS — D649 Anemia, unspecified: Secondary | ICD-10-CM | POA: Diagnosis not present

## 2023-01-31 DIAGNOSIS — J449 Chronic obstructive pulmonary disease, unspecified: Secondary | ICD-10-CM | POA: Diagnosis not present

## 2023-01-31 DIAGNOSIS — N1832 Chronic kidney disease, stage 3b: Secondary | ICD-10-CM | POA: Diagnosis present

## 2023-01-31 DIAGNOSIS — G2581 Restless legs syndrome: Secondary | ICD-10-CM | POA: Diagnosis present

## 2023-01-31 HISTORY — PX: ESOPHAGOGASTRODUODENOSCOPY (EGD) WITH PROPOFOL: SHX5813

## 2023-01-31 LAB — BLOOD GAS, VENOUS
Acid-base deficit: 7 mmol/L — ABNORMAL HIGH (ref 0.0–2.0)
Bicarbonate: 19.2 mmol/L — ABNORMAL LOW (ref 20.0–28.0)
O2 Saturation: 47.5 %
Patient temperature: 37
pCO2, Ven: 40 mmHg — ABNORMAL LOW (ref 44–60)
pH, Ven: 7.29 (ref 7.25–7.43)
pO2, Ven: 31 mmHg — CL (ref 32–45)

## 2023-01-31 LAB — CBC
HCT: 20 % — ABNORMAL LOW (ref 36.0–46.0)
HCT: 21 % — ABNORMAL LOW (ref 36.0–46.0)
HCT: 27.7 % — ABNORMAL LOW (ref 36.0–46.0)
Hemoglobin: 6.5 g/dL — CL (ref 12.0–15.0)
Hemoglobin: 7.1 g/dL — ABNORMAL LOW (ref 12.0–15.0)
Hemoglobin: 9.6 g/dL — ABNORMAL LOW (ref 12.0–15.0)
MCH: 29.3 pg (ref 26.0–34.0)
MCH: 29.6 pg (ref 26.0–34.0)
MCH: 30.7 pg (ref 26.0–34.0)
MCHC: 32.5 g/dL (ref 30.0–36.0)
MCHC: 33.8 g/dL (ref 30.0–36.0)
MCHC: 34.7 g/dL (ref 30.0–36.0)
MCV: 90.1 fL (ref 80.0–100.0)
MCV: 87.5 fL (ref 80.0–100.0)
MCV: 88.5 fL (ref 80.0–100.0)
Platelets: 260 10*3/uL (ref 150–400)
Platelets: 214 10*3/uL (ref 150–400)
Platelets: 232 10*3/uL (ref 150–400)
RBC: 2.22 MIL/uL — ABNORMAL LOW (ref 3.87–5.11)
RBC: 2.4 MIL/uL — ABNORMAL LOW (ref 3.87–5.11)
RBC: 3.13 MIL/uL — ABNORMAL LOW (ref 3.87–5.11)
RDW: 15.5 % (ref 11.5–15.5)
RDW: 14.8 % (ref 11.5–15.5)
RDW: 15.6 % — ABNORMAL HIGH (ref 11.5–15.5)
WBC: 6.2 10*3/uL (ref 4.0–10.5)
WBC: 8.4 10*3/uL (ref 4.0–10.5)
WBC: 13.6 10*3/uL — ABNORMAL HIGH (ref 4.0–10.5)
nRBC: 5.8 % — ABNORMAL HIGH (ref 0.0–0.2)
nRBC: 3.6 % — ABNORMAL HIGH (ref 0.0–0.2)
nRBC: 3 % — ABNORMAL HIGH (ref 0.0–0.2)

## 2023-01-31 LAB — BASIC METABOLIC PANEL
Anion gap: 16 — ABNORMAL HIGH (ref 5–15)
Anion gap: 17 — ABNORMAL HIGH (ref 5–15)
BUN: 67 mg/dL — ABNORMAL HIGH (ref 8–23)
BUN: 57 mg/dL — ABNORMAL HIGH (ref 8–23)
CO2: 16 mmol/L — ABNORMAL LOW (ref 22–32)
CO2: 19 mmol/L — ABNORMAL LOW (ref 22–32)
Calcium: 7.9 mg/dL — ABNORMAL LOW (ref 8.9–10.3)
Calcium: 7.3 mg/dL — ABNORMAL LOW (ref 8.9–10.3)
Chloride: 102 mmol/L (ref 98–111)
Chloride: 103 mmol/L (ref 98–111)
Creatinine, Ser: 2.52 mg/dL — ABNORMAL HIGH (ref 0.44–1.00)
Creatinine, Ser: 2.14 mg/dL — ABNORMAL HIGH (ref 0.44–1.00)
GFR, Estimated: 21 mL/min — ABNORMAL LOW (ref >60)
GFR, Estimated: 26 mL/min — ABNORMAL LOW (ref >60)
Glucose, Bld: 125 mg/dL — ABNORMAL HIGH (ref 70–99)
Glucose, Bld: 173 mg/dL — ABNORMAL HIGH (ref 70–99)
Potassium: 4.2 mmol/L (ref 3.5–5.1)
Potassium: 3.3 mmol/L — ABNORMAL LOW (ref 3.5–5.1)
Sodium: 134 mmol/L — ABNORMAL LOW (ref 135–145)
Sodium: 139 mmol/L (ref 135–145)

## 2023-01-31 LAB — GLOBAL TEG PANEL
CFF Max Amplitude: 26.9 mm (ref 15–32)
CK with Heparinase (R): 2.9 min — ABNORMAL LOW (ref 4.3–8.3)
Citrated Functional Fibrinogen: 490.9 mg/dL (ref 278–581)
Citrated Kaolin (K): 0.9 min (ref 0.8–2.1)
Citrated Kaolin (MA): 68.4 mm (ref 52–69)
Citrated Kaolin (R): 3 min — ABNORMAL LOW (ref 4.6–9.1)
Citrated Kaolin Angle: 76.9 deg (ref 63–78)
Citrated Rapid TEG (MA): 68.3 mm (ref 52–70)

## 2023-01-31 LAB — SODIUM, URINE, RANDOM: Sodium, Ur: 38 mmol/L

## 2023-01-31 LAB — PROCALCITONIN: Procalcitonin: 1.35 ng/mL

## 2023-01-31 LAB — RETICULOCYTES
Immature Retic Fract: 35.7 % — ABNORMAL HIGH (ref 2.3–15.9)
RBC.: 2.18 MIL/uL — ABNORMAL LOW (ref 3.87–5.11)
Retic Count, Absolute: 49.5 10*3/uL (ref 19.0–186.0)
Retic Ct Pct: 2.4 % (ref 0.4–3.1)

## 2023-01-31 LAB — COMPREHENSIVE METABOLIC PANEL
ALT: 33 U/L (ref 0–44)
AST: 63 U/L — ABNORMAL HIGH (ref 15–41)
Albumin: 2.8 g/dL — ABNORMAL LOW (ref 3.5–5.0)
Alkaline Phosphatase: 72 U/L (ref 38–126)
Anion gap: 16 — ABNORMAL HIGH (ref 5–15)
BUN: 72 mg/dL — ABNORMAL HIGH (ref 8–23)
CO2: 17 mmol/L — ABNORMAL LOW (ref 22–32)
Calcium: 7.5 mg/dL — ABNORMAL LOW (ref 8.9–10.3)
Chloride: 102 mmol/L (ref 98–111)
Creatinine, Ser: 2.37 mg/dL — ABNORMAL HIGH (ref 0.44–1.00)
GFR, Estimated: 23 mL/min — ABNORMAL LOW (ref >60)
Glucose, Bld: 97 mg/dL (ref 70–99)
Potassium: 3.8 mmol/L (ref 3.5–5.1)
Sodium: 135 mmol/L (ref 135–145)
Total Bilirubin: 0.6 mg/dL (ref 0.3–1.2)
Total Protein: 5.6 g/dL — ABNORMAL LOW (ref 6.5–8.1)

## 2023-01-31 LAB — CK: Total CK: 662 U/L — ABNORMAL HIGH (ref 38–234)

## 2023-01-31 LAB — GLUCOSE, CAPILLARY
Glucose-Capillary: 90 mg/dL (ref 70–99)
Glucose-Capillary: 95 mg/dL (ref 70–99)
Glucose-Capillary: 92 mg/dL (ref 70–99)
Glucose-Capillary: 80 mg/dL (ref 70–99)
Glucose-Capillary: 112 mg/dL — ABNORMAL HIGH (ref 70–99)
Glucose-Capillary: 119 mg/dL — ABNORMAL HIGH (ref 70–99)
Glucose-Capillary: 143 mg/dL — ABNORMAL HIGH (ref 70–99)

## 2023-01-31 LAB — DIC (DISSEMINATED INTRAVASCULAR COAGULATION)PANEL
D-Dimer, Quant: 11.17 ug{FEU}/mL — ABNORMAL HIGH (ref 0.00–0.50)
Fibrinogen: 348 mg/dL (ref 210–475)
INR: 1.3 — ABNORMAL HIGH (ref 0.8–1.2)
Platelets: 259 10*3/uL (ref 150–400)
Prothrombin Time: 16.6 s — ABNORMAL HIGH (ref 11.4–15.2)
Smear Review: NONE SEEN
aPTT: 27 s (ref 24–36)

## 2023-01-31 LAB — PROTIME-INR
INR: 1.2 (ref 0.8–1.2)
Prothrombin Time: 15.1 seconds (ref 11.4–15.2)

## 2023-01-31 LAB — PHOSPHORUS
Phosphorus: 6.2 mg/dL — ABNORMAL HIGH (ref 2.5–4.6)
Phosphorus: 6 mg/dL — ABNORMAL HIGH (ref 2.5–4.6)

## 2023-01-31 LAB — MAGNESIUM
Magnesium: 2.3 mg/dL (ref 1.7–2.4)
Magnesium: 2.2 mg/dL (ref 1.7–2.4)

## 2023-01-31 LAB — CBG MONITORING, ED
Glucose-Capillary: 92 mg/dL (ref 70–99)
Glucose-Capillary: 55 mg/dL — ABNORMAL LOW (ref 70–99)
Glucose-Capillary: 103 mg/dL — ABNORMAL HIGH (ref 70–99)

## 2023-01-31 LAB — IRON AND TIBC
Iron: 77 ug/dL (ref 28–170)
Saturation Ratios: 23 % (ref 10.4–31.8)
TIBC: 333 ug/dL (ref 250–450)
UIBC: 256 ug/dL

## 2023-01-31 LAB — TSH: TSH: 86.881 u[IU]/mL — ABNORMAL HIGH (ref 0.350–4.500)

## 2023-01-31 LAB — I-STAT CG4 LACTIC ACID, ED: Lactic Acid, Venous: 4.9 mmol/L (ref 0.5–1.9)

## 2023-01-31 LAB — CREATININE, URINE, RANDOM: Creatinine, Urine: 47 mg/dL

## 2023-01-31 LAB — OSMOLALITY: Osmolality: 315 mosm/kg — ABNORMAL HIGH (ref 275–295)

## 2023-01-31 LAB — OSMOLALITY, URINE: Osmolality, Ur: 384 mosm/kg (ref 300–900)

## 2023-01-31 LAB — T4, FREE: Free T4: <0.25 ng/dL — ABNORMAL LOW (ref 0.61–1.12)

## 2023-01-31 LAB — FERRITIN: Ferritin: 519 ng/mL — ABNORMAL HIGH (ref 11–307)

## 2023-01-31 LAB — MRSA NEXT GEN BY PCR, NASAL: MRSA by PCR Next Gen: NOT DETECTED

## 2023-01-31 LAB — PREPARE RBC (CROSSMATCH)

## 2023-01-31 LAB — FIBRINOGEN: Fibrinogen: 332 mg/dL (ref 210–475)

## 2023-01-31 LAB — HIV ANTIBODY (ROUTINE TESTING W REFLEX): HIV Screen 4th Generation wRfx: NONREACTIVE

## 2023-01-31 LAB — VITAMIN B12: Vitamin B-12: 765 pg/mL (ref 180–914)

## 2023-01-31 LAB — FOLATE: Folate: 17.2 ng/mL (ref >5.9)

## 2023-01-31 LAB — PREALBUMIN: Prealbumin: 21 mg/dL (ref 18–38)

## 2023-01-31 LAB — BETA-HYDROXYBUTYRIC ACID: Beta-Hydroxybutyric Acid: 0.21 mmol/L (ref 0.05–0.27)

## 2023-01-31 SURGERY — ESOPHAGOGASTRODUODENOSCOPY (EGD) WITH PROPOFOL
Anesthesia: Monitor Anesthesia Care | Laterality: Left

## 2023-01-31 MED ORDER — ALBUTEROL SULFATE (2.5 MG/3ML) 0.083% IN NEBU: 2.5000 mg | INHALATION_SOLUTION | RESPIRATORY_TRACT | Status: DC | PRN

## 2023-01-31 MED ORDER — HYDROCORTISONE SOD SUC (PF) 100 MG IJ SOLR: 100.0000 mg | Freq: Two times a day (BID) | INTRAMUSCULAR | Status: DC

## 2023-01-31 MED ORDER — PHENYLEPHRINE 80 MCG/ML (10ML) SYRINGE FOR IV PUSH (FOR BLOOD PRESSURE SUPPORT)
PREFILLED_SYRINGE | INTRAVENOUS | Status: DC | PRN
  Administered 2023-01-31 (×2): 160 ug via INTRAVENOUS

## 2023-01-31 MED ORDER — LEVOTHYROXINE SODIUM 25 MCG PO TABS: 150.0000 ug | ORAL_TABLET | Freq: Every day | ORAL | Status: DC

## 2023-01-31 MED ORDER — POTASSIUM CHLORIDE 10 MEQ/100ML IV SOLN
10.0000 meq | INTRAVENOUS | Status: AC
  Administered 2023-01-31 (×2): 10 meq via INTRAVENOUS
  Filled 2023-01-31 (×2): qty 100

## 2023-01-31 MED ORDER — MIDAZOLAM HCL 2 MG/2ML IJ SOLN
0.5000 mg | Freq: Two times a day (BID) | INTRAMUSCULAR | Status: DC
  Administered 2023-01-31 – 2023-02-01 (×3): 0.5 mg via INTRAVENOUS
  Filled 2023-01-31 (×3): qty 2

## 2023-01-31 MED ORDER — SODIUM CHLORIDE 0.9 % IV SOLN: INTRAVENOUS | Status: AC

## 2023-01-31 MED ORDER — NALOXONE HCL 0.4 MG/ML IJ SOLN
0.4000 mg | INTRAMUSCULAR | Status: DC | PRN
  Administered 2023-01-31: 0.4 mg via INTRAVENOUS
  Filled 2023-01-31: qty 1

## 2023-01-31 MED ORDER — LIDOCAINE 2% (20 MG/ML) 5 ML SYRINGE
INTRAMUSCULAR | Status: DC | PRN
  Administered 2023-01-31: 60 mg via INTRAVENOUS

## 2023-01-31 MED ORDER — CHLORHEXIDINE GLUCONATE CLOTH 2 % EX PADS
6.0000 | MEDICATED_PAD | Freq: Every day | CUTANEOUS | Status: DC
  Administered 2023-01-31 – 2023-02-07 (×8): 6 via TOPICAL

## 2023-01-31 MED ORDER — SODIUM CHLORIDE 0.9 % IV SOLN: INTRAVENOUS | Status: DC

## 2023-01-31 MED ORDER — UMECLIDINIUM-VILANTEROL 62.5-25 MCG/ACT IN AEPB
1.0000 | INHALATION_SPRAY | Freq: Every day | RESPIRATORY_TRACT | Status: DC
  Administered 2023-02-01 – 2023-02-08 (×7): 1 via RESPIRATORY_TRACT
  Filled 2023-01-31: qty 14

## 2023-01-31 MED ORDER — HYDROCORTISONE SOD SUC (PF) 100 MG IJ SOLR
100.0000 mg | Freq: Two times a day (BID) | INTRAMUSCULAR | Status: DC
  Administered 2023-01-31 – 2023-02-01 (×2): 100 mg via INTRAVENOUS
  Filled 2023-01-31 (×2): qty 2

## 2023-01-31 MED ORDER — DEXTROSE 50 % IV SOLN: 1.0000 | INTRAVENOUS | Status: AC

## 2023-01-31 MED ORDER — LEVOTHYROXINE SODIUM 100 MCG/5ML IV SOLN
100.0000 ug | Freq: Once | INTRAVENOUS | Status: AC
  Administered 2023-01-31: 100 ug via INTRAVENOUS
  Filled 2023-01-31: qty 5

## 2023-01-31 MED ORDER — ACETAMINOPHEN 325 MG PO TABS
650.0000 mg | ORAL_TABLET | Freq: Four times a day (QID) | ORAL | Status: DC | PRN
  Administered 2023-02-02 – 2023-02-03 (×3): 650 mg via ORAL
  Filled 2023-01-31 (×4): qty 2

## 2023-01-31 MED ORDER — DEXTROSE 5 % IV BOLUS: 250.0000 mL | INTRAVENOUS | Status: DC | PRN

## 2023-01-31 MED ORDER — CALCIUM GLUCONATE-NACL 2-0.675 GM/100ML-% IV SOLN
2.0000 g | Freq: Once | INTRAVENOUS | Status: AC
  Administered 2023-01-31: 2000 mg via INTRAVENOUS
  Filled 2023-01-31: qty 100

## 2023-01-31 MED ORDER — ACETAMINOPHEN 650 MG RE SUPP: 650.0000 mg | Freq: Four times a day (QID) | RECTAL | Status: DC | PRN

## 2023-01-31 MED ORDER — SODIUM CHLORIDE 0.9 % IV SOLN
50.0000 ug/h | INTRAVENOUS | Status: DC
  Administered 2023-01-31: 50 ug/h via INTRAVENOUS
  Filled 2023-01-31: qty 1

## 2023-01-31 MED ORDER — DEXTROSE 50 % IV SOLN
INTRAVENOUS | Status: AC
  Administered 2023-01-31: 50 mL via INTRAVENOUS
  Filled 2023-01-31: qty 50

## 2023-01-31 MED ORDER — SODIUM CHLORIDE 0.9% IV SOLUTION: Freq: Once | INTRAVENOUS | Status: AC

## 2023-01-31 MED ORDER — TRANEXAMIC ACID-NACL 1000-0.7 MG/100ML-% IV SOLN
1000.0000 mg | Freq: Once | INTRAVENOUS | Status: AC
  Administered 2023-01-31: 1000 mg via INTRAVENOUS
  Filled 2023-01-31: qty 100

## 2023-01-31 MED ORDER — PROPOFOL 10 MG/ML IV BOLUS
INTRAVENOUS | Status: DC | PRN
  Administered 2023-01-31 (×3): 20 mg via INTRAVENOUS
  Administered 2023-01-31: 40 mg via INTRAVENOUS

## 2023-01-31 MED ORDER — ONDANSETRON HCL 4 MG PO TABS: 4.0000 mg | ORAL_TABLET | Freq: Four times a day (QID) | ORAL | Status: DC | PRN

## 2023-01-31 MED ORDER — NICOTINE 7 MG/24HR TD PT24
7.0000 mg | MEDICATED_PATCH | Freq: Every day | TRANSDERMAL | Status: DC
  Administered 2023-02-01 – 2023-02-08 (×8): 7 mg via TRANSDERMAL
  Filled 2023-01-31 (×9): qty 1

## 2023-01-31 MED ORDER — PANTOPRAZOLE SODIUM 40 MG IV SOLR
40.0000 mg | Freq: Two times a day (BID) | INTRAVENOUS | Status: DC
  Administered 2023-01-31 – 2023-02-01 (×2): 40 mg via INTRAVENOUS
  Filled 2023-01-31 (×2): qty 10

## 2023-01-31 MED ORDER — DEXTROSE 50 % IV SOLN: 1.0000 | Freq: Once | INTRAVENOUS | Status: DC

## 2023-01-31 MED ORDER — ONDANSETRON HCL 4 MG/2ML IJ SOLN: 4.0000 mg | Freq: Four times a day (QID) | INTRAMUSCULAR | Status: DC | PRN

## 2023-01-31 MED ORDER — PHENYLEPHRINE HCL-NACL 20-0.9 MG/250ML-% IV SOLN: INTRAVENOUS | Status: DC | PRN

## 2023-01-31 SURGICAL SUPPLY — 15 items

## 2023-01-31 NOTE — Progress Notes (Signed)
Evening labs reviewed after her resuscitation today.  - No need for additional blood products at this time.  Continue to monitor for hemodynamic instability and transfuse based on hemodynamics as well as serial hemoglobins - CBC every 6 - Recheck INR tomorrow morning. -Potassium repletion ordered  Steffanie Dunn, DO 01/31/23 7:41 PM [facility]

## 2023-01-31 NOTE — ED Notes (Signed)
ED TO INPATIENT HANDOFF REPORT  ED Nurse Name and Phone #: Delfin Edis / [phone]  S Name/Age/Gender Caroline Williams 62 y.o. female Room/Bed: 028C/028C  Code Status   Code Status: Full Code  Home/SNF/Other Home Patient oriented to: self, place, and situation Is this baseline? Yes   Triage Complete: Triage complete  Chief Complaint Upper GI bleed [K92.2] Acute upper GI bleed [K92.2]  Triage Note Patient BIB EMS home. Family called for seizure activity. Unknown last known well. N/V/D for days. Patient cover in BM. Recent back surgery. Take pain medication all bottles are empty. Unknown SI and HI. Baseline A&Ox4 per family.  86, 108/50, CBG 204, 92%RA. Placed 2liters, 20g LAC   Allergies Allergies  Allergen Reactions   Aspirin Other (See Comments), Shortness Of Breath and Anaphylaxis    Pt states she has had Toradol several times without any reactions  Other Reaction(s): Other (See Comments), respiratory distress   Bee Venom Anaphylaxis   Sulfa Antibiotics Anaphylaxis   Evolocumab     Other Reaction(s): Unknown   Other Other (See Comments)    No blood products.  Patient did  Request that only albumin or albumin-containing products may be administered    Level of Care/Admitting Diagnosis ED Disposition     ED Disposition  Admit   Condition  --   Comment  Hospital Area: [facility] [[id number]]  Level of Care: ICU [6]  May admit patient to [facility] or [facility] if equivalent level of care is available:: No  Covid Evaluation: Asymptomatic - no recent exposure (last 10 days) testing not required  Diagnosis: Acute upper GI bleed [440347]  Admitting Physician: Karren Burly [[id number]]  Attending Physician: Karren Burly [phone]  Certification:: I certify this patient will need inpatient services for at least 2 midnights          B Medical/Surgery History Past Medical History:  Diagnosis Date   Anxiety    Arthritis     Asthma    Bipolar 1 disorder (HCC)    Breast discharge    Breast lump    Breast pain    CAD (coronary artery disease) 12/03/2022    o Cath 2008: no CAD   o CCTA 10/05/19: CAC score 49, 88th percentile, nonobstructive CAD (LM 1-24 mid and dist)  o Myoview 12/31/20: EF 45, no ischemia or infarction, low risk    Chronic pain    on MS Contin and oxycodone   COPD (chronic obstructive pulmonary disease) (HCC)    Depression    Diabetes mellitus    diet and exercise controlled   Dysrhythmia    HR in 30s on metoprolol   Escherichia coli (E. coli) infection    Fever    GERD (gastroesophageal reflux disease)    History of chest pain    History of knee replacement, total    Hypertension    Hypothyroidism    N&V (nausea and vomiting)    Peripheral neuropathy    Right foot no sensation   Sciatica    Sleep apnea    does not use CPAP   Thyroid disease    Wears glasses    Past Surgical History:  Procedure Laterality Date   ABDOMINAL HYSTERECTOMY     partial   APPENDECTOMY     BIOPSY  03/14/2018   Procedure: BIOPSY;  Surgeon: Charlott Rakes, MD;  Location: [facility];  Service: Endoscopy;;   BREAST DUCTAL SYSTEM EXCISION Right 08/31/2017   Procedure: RIGHT BREAST CENTRAL  DUCT EXCISION;  Surgeon: Harriette Bouillon, MD;  Location: [place] [facility];  Service: General;  Laterality: Right;   BREAST EXCISIONAL BIOPSY Right    BREAST LUMPECTOMY     right   CARDIAC CATHETERIZATION     no significant CAD, nl LV function by 11/08/06 cath   CESAREAN SECTION     x4   COLONOSCOPY WITH PROPOFOL N/A 03/14/2018   Procedure: COLONOSCOPY WITH PROPOFOL;  Surgeon: Charlott Rakes, MD;  Location: [facility];  Service: Endoscopy;  Laterality: N/A;   GASTRIC ROUX-EN-Y N/A 11/25/2014   Procedure: LAPAROSCOPIC ROUX-EN-Y GASTRIC BYPASS WITH UPPER ENDOSCOPY;  Surgeon: Luretha Murphy, MD;  Location: [facility];  Service: General;  Laterality: N/A;   HERNIA REPAIR     left inguinal   JOINT  REPLACEMENT     KNEE SURGERY     right knee   MOUTH SURGERY     MYOMECTOMY ABDOMINAL APPROACH  1989   POLYPECTOMY  03/14/2018   Procedure: POLYPECTOMY;  Surgeon: Charlott Rakes, MD;  Location: [facility];  Service: Endoscopy;;   TOTAL KNEE REVISION  06/21/2012   Procedure: TOTAL KNEE REVISION;  Surgeon: Nadara Mustard, MD;  Location: [facility];  Service: Orthopedics;  Laterality: Right;  Revision Right Total Knee Arthroplasty     A IV Location/Drains/Wounds Patient Lines/Drains/Airways Status     Active Line/Drains/Airways     Name Placement date Placement time Site Days   Peripheral IV 01/30/23 20 G Left Antecubital 01/30/23  2012  Antecubital  1   Peripheral IV 01/30/23 20 G Right Antecubital 01/30/23  2134  Antecubital  1            Intake/Output Last 24 hours No intake or output data in the 24 hours ending 01/31/23 7829  Labs/Imaging Results for orders placed or performed during the hospital encounter of 01/30/23 (from the past 48 hour(s))  CBC     Status: Abnormal   Collection Time: 01/30/23  6:58 PM  Result Value Ref Range   WBC 13.9 (H) 4.0 - 10.5 K/uL   RBC 1.09 (L) 3.87 - 5.11 MIL/uL   Hemoglobin 3.0 (LL) 12.0 - 15.0 g/dL    Comment: REPEATED TO VERIFY THIS CRITICAL RESULT HAS VERIFIED AND BEEN CALLED TO Makari Portman, RN BY [facility] [employer] ON 08 18 2024 AT 1928, AND HAS BEEN READ BACK.     HCT 10.5 (L) 36.0 - 46.0 %   MCV 96.3 80.0 - 100.0 fL   MCH 27.5 26.0 - 34.0 pg   MCHC 28.6 (L) 30.0 - 36.0 g/dL   RDW 56.2 (H) 13.0 - 86.5 %   Platelets 375 150 - 400 K/uL    Comment: REPEATED TO VERIFY   nRBC 1.9 (H) 0.0 - 0.2 %    Comment: Performed at [facility], [address] [place], [place] [address]  Basic metabolic panel     Status: Abnormal   Collection Time: 01/30/23  6:58 PM  Result Value Ref Range   Sodium 133 (L) 135 - 145 mmol/L   Potassium 4.4 3.5 - 5.1 mmol/L   Chloride 98 98 - 111 mmol/L   CO2 8 (L) 22 - 32 mmol/L   Glucose, Bld  140 (H) 70 - 99 mg/dL    Comment: Glucose reference range applies only to samples taken after fasting for at least 8 hours.   BUN 68 (H) 8 - 23 mg/dL   Creatinine, Ser 6.29 (H) 0.44 - 1.00 mg/dL   Calcium 7.8 (L) 8.9 -  10.3 mg/dL   GFR, Estimated 20 (L) >60 mL/min    Comment: (NOTE) Calculated using the CKD-EPI Creatinine Equation (2021)    Anion gap 27 (H) 5 - 15    Comment: ELECTROLYTES REPEATED TO VERIFY Performed at [facility], [address] [place], [place] [address]   CBG monitoring, ED     Status: Abnormal   Collection Time: 01/30/23  7:08 PM  Result Value Ref Range   Glucose-Capillary 121 (H) 70 - 99 mg/dL    Comment: Glucose reference range applies only to samples taken after fasting for at least 8 hours.   Comment 1 Notify RN    Comment 2 Document in Chart   Ethanol     Status: None   Collection Time: 01/30/23  7:33 PM  Result Value Ref Range   Alcohol, Ethyl (B) <10 <10 mg/dL    Comment: (NOTE) Lowest detectable limit for serum alcohol is 10 mg/dL.  For medical purposes only. Performed at [facility], [address], [place], [place] [address]   Hepatic function panel     Status: Abnormal   Collection Time: 01/30/23  7:33 PM  Result Value Ref Range   Total Protein 6.0 (L) 6.5 - 8.1 g/dL   Albumin 2.9 (L) 3.5 - 5.0 g/dL   AST 52 (H) 15 - 41 U/L   ALT 29 0 - 44 U/L   Alkaline Phosphatase 72 38 - 126 U/L   Total Bilirubin 0.7 0.3 - 1.2 mg/dL   Bilirubin, Direct <0.9 0.0 - 0.2 mg/dL   Indirect Bilirubin NOT CALCULATED 0.3 - 0.9 mg/dL    Comment: Performed at [facility] Lab, [address], [place], [place] [address]  Salicylate level     Status: Abnormal   Collection Time: 01/30/23  7:33 PM  Result Value Ref Range   Salicylate Lvl <7.0 (L) 7.0 - 30.0 mg/dL    Comment: Performed at [facility] Lab, [address], [place], [place] [address]  Acetaminophen level     Status: Abnormal   Collection Time: 01/30/23  7:33 PM  Result Value Ref  Range   Acetaminophen (Tylenol), Serum <10 (L) 10 - 30 ug/mL    Comment: (NOTE) Therapeutic concentrations vary significantly. A range of 10-30 ug/mL  may be an effective concentration for many patients. However, some  are best treated at concentrations outside of this range. Acetaminophen concentrations >150 ug/mL at 4 hours after ingestion  and >50 ug/mL at 12 hours after ingestion are often associated with  toxic reactions.  Performed at [facility] Lab, [address], [place], [place] [address]   Lipase, blood     Status: Abnormal   Collection Time: 01/30/23  7:33 PM  Result Value Ref Range   Lipase 73 (H) 11 - 51 U/L    Comment: Performed at [facility] Lab, [address] [place], [place] [address]  Type and screen [facility]     Status: None (Preliminary result)   Collection Time: 01/30/23  7:35 PM  Result Value Ref Range   ABO/RH(D) AB POS    Antibody Screen NEG    Sample Expiration 02/02/2023,2359    Unit Number [id number]    Blood Component Type RED CELLS,LR    Unit division 00    Status of Unit ISSUED    Unit tag comment EMERGENCY RELEASE    Transfusion Status OK TO TRANSFUSE    Crossmatch Result COMPATIBLE    Unit  Number [id number]    Blood Component Type RBC LR PHER1    Unit division 00    Status of Unit ISSUED    Unit tag comment EMERGENCY RELEASE    Transfusion Status OK TO TRANSFUSE    Crossmatch Result COMPATIBLE    Unit Number [id number]    Blood Component Type RED CELLS,LR    Unit division 00    Status of Unit ISSUED    Transfusion Status OK TO TRANSFUSE    Crossmatch Result      Compatible Performed at [facility], [address] [place], [place] [address]    Unit Number [id number]    Blood Component Type RED CELLS,LR    Unit division 00    Status of Unit ISSUED    Transfusion Status OK TO TRANSFUSE    Crossmatch Result Compatible   CBC with Differential     Status: Abnormal   Collection Time:  01/30/23  7:54 PM  Result Value Ref Range   WBC 13.8 (H) 4.0 - 10.5 K/uL   RBC 1.01 (L) 3.87 - 5.11 MIL/uL   Hemoglobin 3.0 (LL) 12.0 - 15.0 g/dL    Comment: CRITICAL VALUE NOTED.  VALUE IS CONSISTENT WITH PREVIOUSLY REPORTED AND CALLED VALUE. REPEATED TO VERIFY    HCT 10.0 (L) 36.0 - 46.0 %   MCV 99.0 80.0 - 100.0 fL   MCH 29.7 26.0 - 34.0 pg   MCHC 30.0 30.0 - 36.0 g/dL   RDW 32.4 (H) 40.1 - 02.7 %   Platelets 350 150 - 400 K/uL    Comment: SPECIMEN CHECKED FOR CLOTS REPEATED TO VERIFY    nRBC 1.8 (H) 0.0 - 0.2 %   Neutrophils Relative % 83 %   Neutro Abs 11.4 (H) 1.7 - 7.7 K/uL   Lymphocytes Relative 9 %   Lymphs Abs 1.3 0.7 - 4.0 K/uL   Monocytes Relative 7 %   Monocytes Absolute 1.0 0.1 - 1.0 K/uL   Eosinophils Relative 0 %   Eosinophils Absolute 0.0 0.0 - 0.5 K/uL   Basophils Relative 0 %   Basophils Absolute 0.0 0.0 - 0.1 K/uL   Immature Granulocytes 1 %   Abs Immature Granulocytes 0.12 (H) 0.00 - 0.07 K/uL    Comment: Performed at [facility], [address], [place], [place] [address]  I-Stat CG4 Lactic Acid     Status: Abnormal   Collection Time: 01/30/23  8:14 PM  Result Value Ref Range   Lactic Acid, Venous 14.0 (HH) 0.5 - 1.9 mmol/L   Comment NOTIFIED PHYSICIAN   Prepare RBC     Status: None   Collection Time: 01/30/23  8:18 PM  Result Value Ref Range   Order Confirmation      ORDER PROCESSED BY BLOOD BANK Performed at [facility], [address] [place], [place] [address]   ABO/Rh     Status: None   Collection Time: 01/30/23  8:30 PM  Result Value Ref Range   ABO/RH(D)      AB POS Performed at [facility], [address] [place], [place] [address]   Occult blood card to lab, stool     Status: Abnormal   Collection Time: 01/30/23  8:44 PM  Result Value Ref Range   Fecal Occult Bld POSITIVE (A) NEGATIVE    Comment: Performed at [facility], [address], [place], [place] [address]  I-Stat CG4 Lactic Acid     Status:  Abnormal  Collection Time: 01/30/23 10:53 PM  Result Value Ref Range   Lactic Acid, Venous 11.2 (HH) 0.5 - 1.9 mmol/L   Comment NOTIFIED PHYSICIAN   Urine rapid drug screen (hosp performed)     Status: Abnormal   Collection Time: 01/30/23 11:05 PM  Result Value Ref Range   Opiates POSITIVE (A) NONE DETECTED   Cocaine NONE DETECTED NONE DETECTED   Benzodiazepines NONE DETECTED NONE DETECTED   Amphetamines POSITIVE (A) NONE DETECTED   Tetrahydrocannabinol NONE DETECTED NONE DETECTED   Barbiturates NONE DETECTED NONE DETECTED    Comment: (NOTE) DRUG SCREEN FOR MEDICAL PURPOSES ONLY.  IF CONFIRMATION IS NEEDED FOR ANY PURPOSE, NOTIFY LAB WITHIN 5 DAYS.  LOWEST DETECTABLE LIMITS FOR URINE DRUG SCREEN Drug Class                     Cutoff (ng/mL) Amphetamine and metabolites    1000 Barbiturate and metabolites    200 Benzodiazepine                 200 Opiates and metabolites        300 Cocaine and metabolites        300 THC                            50 Performed at [facility] Lab, [address] [place], [place] [address]   Urinalysis, Routine w reflex microscopic -Urine, Clean Catch     Status: Abnormal   Collection Time: 01/30/23 11:05 PM  Result Value Ref Range   Color, Urine YELLOW YELLOW   APPearance HAZY (A) CLEAR   Specific Gravity, Urine 1.019 1.005 - 1.030   pH 5.0 5.0 - 8.0   Glucose, UA NEGATIVE NEGATIVE mg/dL   Hgb urine dipstick MODERATE (A) NEGATIVE   Bilirubin Urine NEGATIVE NEGATIVE   Ketones, ur NEGATIVE NEGATIVE mg/dL   Protein, ur 30 (A) NEGATIVE mg/dL   Nitrite NEGATIVE NEGATIVE   Leukocytes,Ua NEGATIVE NEGATIVE   RBC / HPF 0-5 0 - 5 RBC/hpf   WBC, UA 6-10 0 - 5 WBC/hpf   Bacteria, UA MANY (A) NONE SEEN   Squamous Epithelial / HPF 0-5 0 - 5 /HPF   Mucus PRESENT    Hyaline Casts, UA PRESENT     Comment: Performed at [facility] Lab, [address] [place], [place] [address]  DIC Panel ONCE - STAT     Status: Abnormal   Collection Time:  01/30/23 11:05 PM  Result Value Ref Range   Prothrombin Time 16.6 (H) 11.4 - 15.2 seconds   INR 1.3 (H) 0.8 - 1.2    Comment: (NOTE) INR goal varies based on device and disease states.    aPTT 27 24 - 36 seconds   Fibrinogen 348 210 - 475 mg/dL    Comment: (NOTE) Fibrinogen results may be underestimated in patients receiving thrombolytic therapy.    D-Dimer, Quant 11.17 (H) 0.00 - 0.50 ug/mL-FEU    Comment: (NOTE) At the manufacturer cut-off value of 0.5 g/mL FEU, this assay has a negative predictive value of 95-100%.This assay is intended for use in conjunction with a clinical pretest probability (PTP) assessment model to exclude pulmonary embolism (PE) and deep venous thrombosis (DVT) in outpatients suspected of PE or DVT. Results should be correlated with clinical presentation.    Platelets 259 150 - 400 K/uL   Smear Review NO SCHISTOCYTES SEEN     Comment: Performed at  [facility] Lab, [address], [place], [place] [address]  Osmolality, urine     Status: None   Collection Time: 01/30/23 11:05 PM  Result Value Ref Range   Osmolality, Ur 384 300 - 900 mOsm/kg    Comment: Performed at [facility] Lab, [address], [place], [place] [address]  Creatinine, urine, random     Status: None   Collection Time: 01/30/23 11:05 PM  Result Value Ref Range   Creatinine, Urine 47 mg/dL    Comment: Performed at [facility], [address], [place], [place] [address]  Sodium, urine, random     Status: None   Collection Time: 01/30/23 11:05 PM  Result Value Ref Range   Sodium, Ur 38 mmol/L    Comment: Performed at [facility] Lab, [address], [place], [place] [address]  Prepare RBC (crossmatch)     Status: None   Collection Time: 01/30/23 11:36 PM  Result Value Ref Range   Order Confirmation      BB SAMPLE OR UNITS ALREADY AVAILABLE Performed at [facility], [address] [place], [place] [address]   Prepare RBC (crossmatch)     Status: None    Collection Time: 01/31/23 12:26 AM  Result Value Ref Range   Order Confirmation      ORDER PROCESSED BY BLOOD BANK Performed at [facility], [address] [place], [place] [address]   Procalcitonin     Status: None   Collection Time: 01/31/23 12:59 AM  Result Value Ref Range   Procalcitonin 1.35 ng/mL    Comment:        Interpretation: PCT > 0.5 ng/mL and <= 2 ng/mL: Systemic infection (sepsis) is possible, but other conditions are known to elevate PCT as well. (NOTE)       Sepsis PCT Algorithm           Lower Respiratory Tract                                      Infection PCT Algorithm    ----------------------------     ----------------------------         PCT < 0.25 ng/mL                PCT < 0.10 ng/mL          Strongly encourage             Strongly discourage   discontinuation of antibiotics    initiation of antibiotics    ----------------------------     -----------------------------       PCT 0.25 - 0.50 ng/mL            PCT 0.10 - 0.25 ng/mL               OR       >80% decrease in PCT            Discourage initiation of                                            antibiotics      Encourage discontinuation           of antibiotics    ----------------------------     -----------------------------  PCT >= 0.50 ng/mL              PCT 0.26 - 0.50 ng/mL                AND       <80% decrease in PCT             Encourage initiation of                                             antibiotics       Encourage continuation           of antibiotics    ----------------------------     -----------------------------        PCT >= 0.50 ng/mL                  PCT > 0.50 ng/mL               AND         increase in PCT                  Strongly encourage                                      initiation of antibiotics    Strongly encourage escalation           of antibiotics                                     -----------------------------                                            PCT <= 0.25 ng/mL                                                 OR                                        > 80% decrease in PCT                                      Discontinue / Do not initiate                                             antibiotics  Performed at [facility] Lab, [address], [place], [place] [address]   T4, free     Status: Abnormal   Collection Time: 01/31/23 12:59 AM  Result Value Ref Range   Free T4 <0.25 (L) 0.61 - 1.12 ng/dL    Comment: (NOTE) Biotin ingestion may interfere with free T4 tests. If the results are inconsistent  with the TSH level, previous test results, or the clinical presentation, then consider biotin interference. If needed, order repeat testing after stopping biotin. Performed at [facility] Lab, [address] [place], [place] [address]   TSH     Status: Abnormal   Collection Time: 01/31/23 12:59 AM  Result Value Ref Range   TSH 86.881 (H) 0.350 - 4.500 uIU/mL    Comment: Performed by a 3rd Generation assay with a functional sensitivity of <=0.01 uIU/mL. Performed at [facility], [address] [place], [place] [address]   Prealbumin     Status: None   Collection Time: 01/31/23 12:59 AM  Result Value Ref Range   Prealbumin 21 18 - 38 mg/dL    Comment: Performed at [facility] Lab, [address], [place], [place] [address]  Magnesium     Status: None   Collection Time: 01/31/23 12:59 AM  Result Value Ref Range   Magnesium 2.3 1.7 - 2.4 mg/dL    Comment: Performed at [facility], [address] [place], [place] [address]  Phosphorus     Status: Abnormal   Collection Time: 01/31/23 12:59 AM  Result Value Ref Range   Phosphorus 6.2 (H) 2.5 - 4.6 mg/dL    Comment: Performed at [facility], [address] [place], [place] [address]  Osmolality     Status: Abnormal   Collection Time: 01/31/23 12:59 AM  Result Value Ref Range   Osmolality 315 (H) 275 - 295 mOsm/kg    Comment:  Performed at [facility], [address], [place], [place] [address]  CK     Status: Abnormal   Collection Time: 01/31/23 12:59 AM  Result Value Ref Range   Total CK 662 (H) 38 - 234 U/L    Comment: Performed at [facility], [address] [place], [place] [address]  Vitamin B12     Status: None   Collection Time: 01/31/23 12:59 AM  Result Value Ref Range   Vitamin B-12 765 180 - 914 pg/mL    Comment: (NOTE) This assay is not validated for testing neonatal or myeloproliferative syndrome specimens for Vitamin B12 levels. Performed at [facility] Lab, [address], [place], [place] [address]   Folate     Status: None   Collection Time: 01/31/23 12:59 AM  Result Value Ref Range   Folate 17.2 >5.9 ng/mL    Comment: Performed at [facility] Lab, [address], [place], [place] [address]  Iron and TIBC     Status: None   Collection Time: 01/31/23 12:59 AM  Result Value Ref Range   Iron 77 28 - 170 ug/dL   TIBC 244 010 - 272 ug/dL   Saturation Ratios 23 10.4 - 31.8 %   UIBC 256 ug/dL    Comment: Performed at [facility], [address] [place], [place] [address]  Ferritin     Status: Abnormal   Collection Time: 01/31/23 12:59 AM  Result Value Ref Range   Ferritin 519 (H) 11 - 307 ng/mL    Comment: Performed at [facility], [address], [place], [place] [address]  Reticulocytes     Status: Abnormal   Collection Time: 01/31/23 12:59 AM  Result Value Ref Range   Retic Ct Pct 2.4 0.4 - 3.1 %    Comment: REPEATED TO VERIFY   RBC. 2.18 (L) 3.87 - 5.11 MIL/uL    Comment:  RESULTS CONFIRMED BY MANUAL DILUTION   Retic Count, Absolute 49.5 19.0 - 186.0 K/uL   Immature Retic Fract 35.7 (H) 2.3 - 15.9 %    Comment: Performed at [facility] Lab, [address] [place], [place] [address]  Beta-hydroxybutyric acid     Status: None   Collection Time: 01/31/23 12:59 AM  Result Value Ref Range   Beta-Hydroxybutyric Acid 0.21 0.05 - 0.27 mmol/L     Comment: Performed at [facility], [address] [place], [place] [address]  CBC     Status: Abnormal   Collection Time: 01/31/23 12:59 AM  Result Value Ref Range   WBC 6.2 4.0 - 10.5 K/uL   RBC 2.22 (L) 3.87 - 5.11 MIL/uL   Hemoglobin 6.5 (LL) 12.0 - 15.0 g/dL    Comment: CRITICAL VALUE NOTED.  VALUE IS CONSISTENT WITH PREVIOUSLY REPORTED AND CALLED VALUE. REPEATED TO VERIFY POST TRANSFUSION SPECIMEN    HCT 20.0 (L) 36.0 - 46.0 %   MCV 90.1 80.0 - 100.0 fL    Comment: DELTA CHECK NOTED   MCH 29.3 26.0 - 34.0 pg   MCHC 32.5 30.0 - 36.0 g/dL   RDW 40.1 02.7 - 25.3 %   Platelets 260 150 - 400 K/uL   nRBC 5.8 (H) 0.0 - 0.2 %    Comment: Performed at [facility] Lab, [address] [place], [place] [address]  Basic metabolic panel     Status: Abnormal   Collection Time: 01/31/23 12:59 AM  Result Value Ref Range   Sodium 134 (L) 135 - 145 mmol/L   Potassium 4.2 3.5 - 5.1 mmol/L   Chloride 102 98 - 111 mmol/L   CO2 16 (L) 22 - 32 mmol/L   Glucose, Bld 125 (H) 70 - 99 mg/dL    Comment: Glucose reference range applies only to samples taken after fasting for at least 8 hours.   BUN 67 (H) 8 - 23 mg/dL   Creatinine, Ser 3.47 (H) 0.44 - 1.00 mg/dL   Calcium 7.9 (L) 8.9 - 10.3 mg/dL   GFR, Estimated 21 (L) >60 mL/min    Comment: (NOTE) Calculated using the CKD-EPI Creatinine Equation (2021)    Anion gap 16 (H) 5 - 15    Comment: ELECTROLYTES REPEATED TO VERIFY Performed at [facility], [address], [place], [place] [address]   CBG monitoring, ED     Status: None   Collection Time: 01/31/23  2:07 AM  Result Value Ref Range   Glucose-Capillary 92 70 - 99 mg/dL    Comment: Glucose reference range applies only to samples taken after fasting for at least 8 hours.  Magnesium     Status: None   Collection Time: 01/31/23  4:06 AM  Result Value Ref Range   Magnesium 2.2 1.7 - 2.4 mg/dL    Comment: Performed at [facility] Lab, [address] [place], [place]  [address]  Phosphorus     Status: Abnormal   Collection Time: 01/31/23  4:06 AM  Result Value Ref Range   Phosphorus 6.0 (H) 2.5 - 4.6 mg/dL    Comment: Performed at [facility], [address], [place], [place] [address]  Comprehensive metabolic panel     Status: Abnormal   Collection Time: 01/31/23  4:06 AM  Result Value Ref Range   Sodium 135 135 - 145 mmol/L   Potassium 3.8 3.5 - 5.1 mmol/L   Chloride 102 98 - 111 mmol/L  CO2 17 (L) 22 - 32 mmol/L   Glucose, Bld 97 70 - 99 mg/dL    Comment: Glucose reference range applies only to samples taken after fasting for at least 8 hours.   BUN 72 (H) 8 - 23 mg/dL   Creatinine, Ser 4.09 (H) 0.44 - 1.00 mg/dL   Calcium 7.5 (L) 8.9 - 10.3 mg/dL   Total Protein 5.6 (L) 6.5 - 8.1 g/dL   Albumin 2.8 (L) 3.5 - 5.0 g/dL   AST 63 (H) 15 - 41 U/L   ALT 33 0 - 44 U/L   Alkaline Phosphatase 72 38 - 126 U/L   Total Bilirubin 0.6 0.3 - 1.2 mg/dL   GFR, Estimated 23 (L) >60 mL/min    Comment: (NOTE) Calculated using the CKD-EPI Creatinine Equation (2021)    Anion gap 16 (H) 5 - 15    Comment: Performed at [facility], [address] [place], [place] [address]  I-Stat CG4 Lactic Acid     Status: Abnormal   Collection Time: 01/31/23  4:28 AM  Result Value Ref Range   Lactic Acid, Venous 4.9 (HH) 0.5 - 1.9 mmol/L   Comment NOTIFIED PHYSICIAN   CBG monitoring, ED     Status: Abnormal   Collection Time: 01/31/23  4:46 AM  Result Value Ref Range   Glucose-Capillary 55 (L) 70 - 99 mg/dL    Comment: Glucose reference range applies only to samples taken after fasting for at least 8 hours.   CT ANGIO GI BLEED  Result Date: 01/30/2023 CLINICAL DATA:  Bloody diarrhea. Concern for acute GI bleed or mesenteric ischemia. EXAM: CTA ABDOMEN AND PELVIS WITHOUT AND WITH CONTRAST TECHNIQUE: Multidetector CT imaging of the abdomen and pelvis was performed using the standard protocol during bolus administration of intravenous contrast. Multiplanar  reconstructed images and MIPs were obtained and reviewed to evaluate the vascular anatomy. RADIATION DOSE REDUCTION: This exam was performed according to the departmental dose-optimization program which includes automated exposure control, adjustment of the mA and/or kV according to patient size and/or use of iterative reconstruction technique. CONTRAST:  75mL OMNIPAQUE IOHEXOL 350 MG/ML SOLN COMPARISON:  09/10/2021 FINDINGS: VASCULAR Aorta: Normal caliber aorta without aneurysm, dissection, vasculitis or significant stenosis. Scattered aortic calcifications. Celiac: Patent without evidence of aneurysm, dissection, vasculitis or significant stenosis. SMA: Patent without evidence of aneurysm, dissection, vasculitis or significant stenosis. Renals: Both renal arteries are patent without evidence of aneurysm, dissection, vasculitis, fibromuscular dysplasia or significant stenosis. IMA: Patent without evidence of aneurysm, dissection, vasculitis or significant stenosis. Inflow: Atherosclerotic calcifications. No aneurysm or dissection. No stenosis. Proximal Outflow: Bilateral common femoral and visualized portions of the superficial and profunda femoral arteries are patent without evidence of aneurysm, dissection, vasculitis or significant stenosis. Veins: No obvious venous abnormality within the limitations of this arterial phase study. Review of the MIP images confirms the above findings. NON-VASCULAR Lower chest: No acute findings Hepatobiliary: Stable left hepatic cyst which appears benign. No suspicious focal hepatic abnormality. Gallbladder unremarkable. Pancreas: No focal abnormality or ductal dilatation. Spleen: No focal abnormality.  Normal size. Adrenals/Urinary Tract: Diffuse enlargement of the left adrenal gland. Right adrenal nodule is unchanged. No renal or ureteral stones. No renal or ureteral stones. No hydronephrosis. Urinary bladder unremarkable. Stomach/Bowel: High-density material throughout the  colon making it difficult to assess for contrast extravasation. No obvious contrast extravasation. Moderate stool burden throughout the colon. No abnormal wall thickening to suggest ischemia. No evidence of bowel obstruction. Changes of gastric bypass. Lymphatic: No adenopathy  Reproductive: Prior hysterectomy.  No adnexal masses. Other: No free fluid or free air. Musculoskeletal: Postoperative changes in the lumbar spine. Fluid collection in the midline of the back in the subcutaneous soft tissues measuring 5.1 x 3.7 cm compatible with postoperative fluid collection. No acute bony abnormality. IMPRESSION: VASCULAR Scattered aortoiliac atherosclerosis. No significant stenosis, aneurysm or dissection. Difficult to assess for GI bleed within the colon due to ingested high-density material throughout the colon. No obvious or large GI bleed visualized. NON-VASCULAR No changes of mesenteric ischemia noted. Mild gaseous distention of the colon moderate stool burden. Electronically Signed   By: Charlett Nose M.D.   On: 01/30/2023 22:19   DG Chest Port 1 View  Result Date: 01/30/2023 CLINICAL DATA:  Altered mental status EXAM: PORTABLE CHEST 1 VIEW COMPARISON:  09/09/2021 FINDINGS: Low volume AP portable examination. Cardiomegaly. Both lungs are clear. The visualized skeletal structures are unremarkable. IMPRESSION: Low volume AP portable examination. Cardiomegaly without acute abnormality of the lungs. Electronically Signed   By: Jearld Lesch M.D.   On: 01/30/2023 20:06    Pending Labs Unresulted Labs (From admission, onward)     Start     Ordered   01/31/23 1000  CBC  Now then every 6 hours,   R      01/31/23 0029   01/31/23 0530  CBC  Once,   R        01/31/23 0530   01/31/23 0430  Fibrinogen  Once,   AD        01/31/23 0430   01/31/23 0307  HIV Antibody (routine testing w rflx)  (HIV Antibody (Routine testing w reflex) panel)  Once,   R        01/31/23 0306   01/30/23 2357  Urine Culture (for pregnant,  neutropenic or urologic patients or patients with an indwelling urinary catheter)  (Urine Culture)  ONCE - URGENT,   URGENT       Question:  Indication  Answer:  Sepsis   01/30/23 2357   01/30/23 2354  T3  Add-on,   AD        01/30/23 2353   01/30/23 2353  Blood gas, venous  Once,   R        01/30/23 2352   01/30/23 2143  Blood culture (routine x 2)  BLOOD CULTURE X 2,   R      01/30/23 2142            Vitals/Pain Today's Vitals   01/31/23 0423 01/31/23 0440 01/31/23 0510 01/31/23 0525  BP:  114/72 114/64 107/64  Pulse:  (!) 108 (!) 106 (!) 103  Resp:  (!) 21 (!) 21 (!) 21  Temp: 98.4 F (36.9 C) 99.6 F (37.6 C) 98.3 F (36.8 C) 98.3 F (36.8 C)  TempSrc: Oral Oral Oral Oral  SpO2:  97%  96%  PainSc:        Isolation Precautions No active isolations  Medications Medications  LORazepam (ATIVAN) injection 1 mg (1 mg Intravenous Given 01/30/23 2012)  0.9 %  sodium chloride infusion (Manually program via Guardrails IV Fluids) ( Intravenous Not Given 01/31/23 0056)  pantoprozole (PROTONIX) 80 mg /NS 100 mL infusion (8 mg/hr Intravenous New Bag/Given 01/31/23 0235)  pantoprazole (PROTONIX) injection 40 mg (has no administration in time range)  cefTRIAXone (ROCEPHIN) 1 g in sodium chloride 0.9 % 100 mL IVPB (0 g Intravenous Stopped 01/31/23 0241)  insulin aspart (novoLOG) injection 0-9 Units ( Subcutaneous Not Given 01/31/23 0447)  levothyroxine (  SYNTHROID) tablet 150 mcg (has no administration in time range)  umeclidinium-vilanterol (ANORO ELLIPTA) 62.5-25 MCG/ACT 1 puff (has no administration in time range)  0.9 %  sodium chloride infusion (0 mLs Intravenous Hold 01/31/23 0334)  acetaminophen (TYLENOL) tablet 650 mg (has no administration in time range)    Or  acetaminophen (TYLENOL) suppository 650 mg (has no administration in time range)  ondansetron (ZOFRAN) tablet 4 mg (has no administration in time range)    Or  ondansetron (ZOFRAN) injection 4 mg (has no administration  in time range)  albuterol (PROVENTIL) (2.5 MG/3ML) 0.083% nebulizer solution 2.5 mg (has no administration in time range)  nicotine (NICODERM CQ - dosed in mg/24 hr) patch 7 mg (has no administration in time range)  naloxone [facility]) injection 0.4 mg (0.4 mg Intravenous Given 01/31/23 0102)  dextrose 5 % bolus 250 mL (has no administration in time range)  pantoprazole (PROTONIX) injection 40 mg (40 mg Intravenous Given 01/30/23 2014)  lactated ringers bolus 1,000 mL (0 mLs Intravenous Stopped 01/30/23 2317)  0.9 %  sodium chloride infusion (Manually program via Guardrails IV Fluids) (0 mLs Intravenous Stopped 01/30/23 2302)  pantoprazole (PROTONIX) injection 40 mg (40 mg Intravenous Given 01/30/23 2242)  iohexol (OMNIPAQUE) 350 MG/ML injection 75 mL (75 mLs Intravenous Contrast Given 01/30/23 2208)  0.9 %  sodium chloride infusion (Manually program via Guardrails IV Fluids) ( Intravenous New Bag/Given 01/31/23 0056)  dextrose 50 % solution 50 mL (50 mLs Intravenous Given 01/31/23 0529)    Mobility walks with device     Focused Assessments Neuro Assessment Handoff:  Swallow screen pass? No  Cardiac Rhythm: Normal sinus rhythm       Neuro Assessment: Exceptions to WDL Neuro Checks:      Has TPA been given? No If patient is a Neuro Trauma and patient is going to OR before floor call report to 4N Charge nurse: [phone] or [phone]   R Recommendations: See Admitting Provider Note  Report given to:   Additional Notes: Received 2 units emergent blood, hemoglobin 3.0 initial, receiving 4/4 unit now.

## 2023-01-31 NOTE — Progress Notes (Addendum)
Called Hilma Favors- the only phone number listed in the chart for patient's emergency contact. Message left to please call back, attempted repeat call.    She is confused and not able to consent herself for EGD at this time.   Steffanie Dunn, DO 01/31/23 8:50 AM [facility]  For contact information, see Amion. If no response to pager, please call PCCM consult pager. After hours, 7PM- 7AM, please call Elink.    Thereasa Distance called back and consented over the phone for EGD. Consent witnessed by RN Richardson Dopp. Form signed and placed in the chart.   Steffanie Dunn, DO 01/31/23 9:03 AM [facility]  For contact information, see Amion. If no response to pager, please call PCCM consult pager. After hours, 7PM- 7AM, please call Elink.

## 2023-01-31 NOTE — Assessment & Plan Note (Signed)
Chronic-stable.

## 2023-01-31 NOTE — ED Notes (Signed)
Lad results was given to Nurse.

## 2023-01-31 NOTE — Progress Notes (Signed)
Initial Nutrition Assessment  DOCUMENTATION CODES:  Obesity unspecified  INTERVENTION:  If unable to advance diet or initiate EN, recommend TPN on hospital day 4 (7 days total of poor PO as pt reported N/V x 3 days PTA) Once enteral access established, add bariatric MVI regimen Consider assessing micronutrients for bariatric surgery: A, D, E, K thiamine, C, copper, Zinc (iron, folate, and Vitamin B12 WNL)  NUTRITION DIAGNOSIS:  Inadequate oral intake related to lethargy/confusion as evidenced by NPO status.  GOAL:  Patient will meet greater than or equal to 90% of their needs  MONITOR:  Diet advancement, I & O's, Labs  REASON FOR ASSESSMENT:  Consult Assessment of nutrition requirement/status  ASSESSMENT:  Pt with hx of CAD, COPD, GERD, DM type 2, hypothyroidism, HTN, HLD, hx of roux-en-y gastric bypass 2016, and CKD3 presented to ED with abdominal pain with vomiting blood and dark stools for several days.  8/19 - EGD, overall normal exam. Small hiatal hernia and small erosions to the gastrojejunal anastomosis.   Pt just returned from EGD at the time of assessment. Not able to provide a nutrition hx, quite lethargic. No family present at bedside. On exam, appears well nourished. GI recommends pt stay NPO at this time and that NGT be avoided. If pt not able to have diet advanced or enteral feeds initiated, would recommend initiation of TPN on hospital day 4 (due to 3 days of vomiting and diarrhea PTA, 7 days total of inadequate PO intake).   Noted pt has been following-up with her outpatient RD for post-bariatric surgery fairly consistently for 2 years. No micronutrient labs have been assessed since the time of surgery. B12 and folate WNL this admission. Per outpatient RD, pt seems to be adherent to MVI regimen    Nutritionally Relevant Medications: Scheduled Meds:  hydrocortisone sod succinate inj  100 mg Intravenous Q12H   insulin aspart  0-9 Units Subcutaneous Q4H   pantoprazole   40 mg Intravenous Q12H   Continuous Infusions:  sodium chloride 100 mL/hr at 01/31/23 1034   cefTRIAXone (ROCEPHIN)  IV Stopped (01/31/23 0241)   dextrose     PRN Meds: ondansetron  Labs Reviewed: BUN 72, creatinine 2.37 Phosphorus 6.0 CBG ranges from 55-121 mg/dL over the last 24 hours HgbA1c 6.0% (7/30)  Micronutrient Profile: Vitamin B12 765 (WNL) Folate 17.2 (WNL)  NUTRITION - FOCUSED PHYSICAL EXAM:  Flowsheet Row Most Recent Value  Orbital Region No depletion  Upper Arm Region No depletion  Thoracic and Lumbar Region No depletion  Buccal Region No depletion  Temple Region No depletion  Clavicle Bone Region No depletion  Clavicle and Acromion Bone Region No depletion  Scapular Bone Region No depletion  Dorsal Hand No depletion  Patellar Region No depletion  Anterior Thigh Region No depletion  Posterior Calf Region No depletion  Edema (RD Assessment) Moderate  [generalized]  Hair Reviewed  Eyes Reviewed  Mouth Reviewed  Skin Reviewed  Nails Reviewed   Diet Order:   Diet Order             Diet NPO time specified  Diet effective now                   EDUCATION NEEDS:  Not appropriate for education at this time  Skin:  Skin Assessment: Reviewed RN Assessment  Last BM:  8/19 - type 7  Height:  Ht Readings from Last 1 Encounters:  01/12/23 5\' 5"  (1.651 m)   Weight:  Wt Readings from Last 1  Encounters:  01/31/23 103.1 kg   Ideal Body Weight:  56.8 kg  BMI:  Body mass index is 37.82 kg/m.  Estimated Nutritional Needs:  Kcal:  1900-2100 kcal/d Protein:  90-110 g/d Fluid:  >/=2L/d    Greig Castilla, RD, LDN Clinical Dietitian RD pager # available in AMION  After hours/weekend pager # available in [facility]

## 2023-01-31 NOTE — Assessment & Plan Note (Signed)
Resume home medications once stable

## 2023-01-31 NOTE — ED Notes (Signed)
PT HAD LARGE URINARY INCONTINENT EPISODE WITH MODERATE AMOUNT OF BLACK LIQUID BM. CHANGED AND CLEANSED AT THIS TIME.

## 2023-01-31 NOTE — Anesthesia Preprocedure Evaluation (Signed)
Anesthesia Evaluation  Patient identified by MRN, date of birth, ID band Patient awake    Reviewed: Allergy & Precautions, NPO status , Patient's Chart, lab work & pertinent test results  Airway Mallampati: III  TM Distance: >3 FB Neck ROM: Full    Dental  (+) Poor Dentition   Pulmonary asthma , sleep apnea , COPD,  COPD inhaler, former smoker   Pulmonary exam normal        Cardiovascular hypertension, Pt. on medications and Pt. on home beta blockers + CAD   Rhythm:Regular Rate:Normal     Neuro/Psych   Anxiety Depression Bipolar Disorder   negative neurological ROS     GI/Hepatic Neg liver ROS,GERD  Medicated,,melena   Endo/Other  diabetesHypothyroidism    Renal/GU      Musculoskeletal  (+) Arthritis , Osteoarthritis,    Abdominal Normal abdominal exam  (+)   Peds  Hematology  (+) Blood dyscrasia, anemia Lab Results      Component                Value               Date                      WBC                      8.4                 01/31/2023                HGB                      7.1 (L)             01/31/2023                HCT                      21.0 (L)            01/31/2023                MCV                      87.5                01/31/2023                PLT                      214                 01/31/2023              Anesthesia Other Findings   Reproductive/Obstetrics                             Anesthesia Physical Anesthesia Plan  ASA: 4  Anesthesia Plan: MAC   Post-op Pain Management:    Induction: Intravenous  PONV Risk Score and Plan: 2 and Propofol infusion and Treatment may vary due to age or medical condition  Airway Management Planned: Simple Face Mask and Nasal Cannula  Additional Equipment: None  Intra-op Plan:   Post-operative Plan:   Informed Consent: I have reviewed the patients History and Physical, chart, labs and discussed the  procedure  including the risks, benefits and alternatives for the proposed anesthesia with the patient or authorized representative who has indicated his/her understanding and acceptance.     Dental advisory given  Plan Discussed with: CRNA  Anesthesia Plan Comments:        Anesthesia Quick Evaluation

## 2023-01-31 NOTE — Assessment & Plan Note (Signed)
Treat with Rocephin await results of urine culture

## 2023-01-31 NOTE — Transfer of Care (Signed)
Immediate Anesthesia Transfer of Care Note  Patient: Caroline Williams  Procedure(s) Performed: ESOPHAGOGASTRODUODENOSCOPY (EGD) WITH PROPOFOL (Left)  Patient Location: PACU  Anesthesia Type:MAC  Level of Consciousness: lethargic and obtunded  Airway & Oxygen Therapy: Patient Spontanous Breathing  Post-op Assessment: Report given to RN and Post -op Vital signs reviewed and stable  Post vital signs: Reviewed and stable  Last Vitals:  Vitals Value Taken Time  BP 126/63 01/31/23 1045  Temp    Pulse 99 01/31/23 1050  Resp 16 01/31/23 1050  SpO2 100 % 01/31/23 1050  Vitals shown include unfiled device data.  Last Pain:  Vitals:   01/31/23 1016  TempSrc: Temporal  PainSc:          Complications: No notable events documented.

## 2023-01-31 NOTE — Progress Notes (Addendum)
OT Cancellation Note  Patient Details Name: Caroline Williams MRN: [id number] DOB: [birth year]   Cancelled Treatment:    Reason Eval/Treat Not Completed: Patient at procedure or test/ unavailable (EGD - OT to f/u when pt is available)  Addendum: pt returned from procedure, per RN she is not appropriate for therapy today.   [name] 01/31/2023, 10:00 AM

## 2023-01-31 NOTE — ED Notes (Signed)
Paged MD due to CBG being 55

## 2023-01-31 NOTE — H&P (View-Only) (Signed)
[facility] Consultation Note  Referring Provider: Pulmonary Critical Care Primary Care Physician:  Hoy Register, MD Primary Gastroenterologist:  Gentry Fitz  Reason for Consultation:  melena, anemia  HPI: Caroline Williams is a 62 y.o. female presenting anemia, altered mental status, melena.  Had few days of hematemesis prior per report, but no hematemesis since her arrival to hospital.  Improved with volume resuscitation.  Unable to obtain any further details due to altered mental status.   Past Medical History:  Diagnosis Date   Anxiety    Arthritis    Asthma    Bipolar 1 disorder (HCC)    Breast discharge    Breast lump    Breast pain    CAD (coronary artery disease) 12/03/2022    o Cath 2008: no CAD   o CCTA 10/05/19: CAC score 49, 88th percentile, nonobstructive CAD (LM 1-24 mid and dist)  o Myoview 12/31/20: EF 45, no ischemia or infarction, low risk    Chronic pain    on MS Contin and oxycodone   COPD (chronic obstructive pulmonary disease) (HCC)    Depression    Diabetes mellitus    diet and exercise controlled   Dysrhythmia    HR in 30s on metoprolol   Escherichia coli (E. coli) infection    Fever    GERD (gastroesophageal reflux disease)    History of chest pain    History of knee replacement, total    Hypertension    Hypothyroidism    N&V (nausea and vomiting)    Peripheral neuropathy    Right foot no sensation   Sciatica    Sleep apnea    does not use CPAP   Thyroid disease    Wears glasses     Past Surgical History:  Procedure Laterality Date   ABDOMINAL HYSTERECTOMY     partial   APPENDECTOMY     BIOPSY  03/14/2018   Procedure: BIOPSY;  Surgeon: Charlott Rakes, MD;  Location: [facility];  Service: Endoscopy;;   BREAST DUCTAL SYSTEM EXCISION Right 08/31/2017   Procedure: RIGHT BREAST CENTRAL DUCT EXCISION;  Surgeon: Harriette Bouillon, MD;  Location: [facility];  Service: General;  Laterality: Right;   BREAST EXCISIONAL  BIOPSY Right    BREAST LUMPECTOMY     right   CARDIAC CATHETERIZATION     no significant CAD, nl LV function by 11/08/06 cath   CESAREAN SECTION     x4   COLONOSCOPY WITH PROPOFOL N/A 03/14/2018   Procedure: COLONOSCOPY WITH PROPOFOL;  Surgeon: Charlott Rakes, MD;  Location: [facility];  Service: Endoscopy;  Laterality: N/A;   GASTRIC ROUX-EN-Y N/A 11/25/2014   Procedure: LAPAROSCOPIC ROUX-EN-Y GASTRIC BYPASS WITH UPPER ENDOSCOPY;  Surgeon: Luretha Murphy, MD;  Location: [facility];  Service: General;  Laterality: N/A;   HERNIA REPAIR     left inguinal   JOINT REPLACEMENT     KNEE SURGERY     right knee   MOUTH SURGERY     MYOMECTOMY ABDOMINAL APPROACH  1989   POLYPECTOMY  03/14/2018   Procedure: POLYPECTOMY;  Surgeon: Charlott Rakes, MD;  Location: [facility];  Service: Endoscopy;;   TOTAL KNEE REVISION  06/21/2012   Procedure: TOTAL KNEE REVISION;  Surgeon: Nadara Mustard, MD;  Location: [facility];  Service: Orthopedics;  Laterality: Right;  Revision Right Total Knee Arthroplasty    Prior to Admission medications   Medication Sig Start Date End Date Taking? Authorizing Provider  dapagliflozin propanediol (FARXIGA) 5 MG TABS tablet Take 1  tablet (5 mg total) by mouth daily before breakfast. 10/07/22  Yes Newlin, Enobong, MD  solifenacin (VESICARE) 5 MG tablet Take 1 tablet (5 mg total) by mouth daily. 10/07/22  Yes Newlin, Enobong, MD  albuterol (VENTOLIN HFA) 108 (90 Base) MCG/ACT inhaler INHALE 2 PUFFS INTO THE LUNGS EVERY 6 HOURS AS NEEDED FOR WHEEZING OR SHORTNESS OF BREATH 09/23/21   Hoy Register, MD  Alirocumab (PRALUENT) 150 MG/ML SOAJ INJECT 1 PEN INTO SKIN EVERY 14 DAYS. 12/03/22   Tereso Newcomer T, PA-C  ALPRAZolam Prudy Feeler) 1 MG tablet Take 1 mg by mouth 3 (three) times daily as needed for anxiety. 07/02/14   [provider]  amphetamine-dextroamphetamine (ADDERALL XR) 30 MG 24 hr capsule Take 30 mg by mouth daily.  03/10/18   [provider]   amphetamine-dextroamphetamine (ADDERALL) 20 MG tablet Take 20 mg by mouth every evening. 10/13/17   [provider]  Blood Glucose Monitoring Suppl (ACCU-CHEK GUIDE) w/Device KIT 1 kit by Does not apply route 3 (three) times daily. To check blood sugars 04/10/21   Hoy Register, MD  cetirizine (ZYRTEC) 10 MG tablet Take 1 tablet (10 mg total) by mouth daily. 04/07/21   Hoy Register, MD  Cyanocobalamin (VITAMIN B 12 PO) Take 1 tablet by mouth daily. Unsure of dose    [provider]  cyclobenzaprine (FLEXERIL) 10 MG tablet Take 1 tablet (10 mg total) by mouth 3 (three) times daily as needed for muscle spasms. 01/14/23   Tressie Stalker, MD  diclofenac Sodium (VOLTAREN) 1 % GEL Apply 2-4 g topically 2 (two) times daily as needed (wrist/knee pain). 11/23/21   Hoy Register, MD  docusate sodium (COLACE) 100 MG capsule Take 1 capsule (100 mg total) by mouth 2 (two) times daily. 01/14/23   Tressie Stalker, MD  doxepin (SINEQUAN) 75 MG capsule Take 75 mg by mouth at bedtime. 03/02/18   [provider]  fluticasone (FLONASE) 50 MCG/ACT nasal spray SHAKE LIQUID AND USE TWO SPRAYS IN EACH NOSTRIL DAILY 12/21/22   Hoy Register, MD  glucose blood (ACCU-CHEK GUIDE) test strip Use as instructed 04/10/21   Hoy Register, MD  hydrocortisone-pramoxine (ANALPRAM HC) 2.5-1 % rectal cream Place 1 application rectally 3 (three) times daily. 07/13/21   Hoy Register, MD  isosorbide mononitrate (IMDUR) 30 MG 24 hr tablet Take 1 tablet (30 mg total) by mouth daily. 12/11/20   Lyn Records, MD  Lancets (ACCU-CHEK MULTICLIX) lancets Use as instructed 04/10/21   Hoy Register, MD  levothyroxine (SYNTHROID) 150 MCG tablet TAKE 1 TABLET(150 MCG) BY MOUTH DAILY BEFORE BREAKFAST 05/10/22   Hoy Register, MD  linaclotide (LINZESS) 290 MCG CAPS capsule Take 290 mcg by mouth See admin instructions. Every other night    [provider]  metoprolol succinate (TOPROL XL) 25 MG 24 hr tablet  Take 0.5 tablets (12.5 mg total) by mouth at bedtime. Patient not taking: Reported on 01/11/2023 01/03/23   Tereso Newcomer T, PA-C  morphine (MS CONTIN) 15 MG 12 hr tablet Take 1 tablet (15 mg total) by mouth every 12 (twelve) hours. 11/14/14   Jones Bales, NP  naloxone [facility]) nasal spray 4 mg/0.1 mL Place 1 spray into the nose as needed (overdose). 05/22/20   [provider]  nitroGLYCERIN (NITROSTAT) 0.4 MG SL tablet DISSOLVE 1 TABLET UNDER THE TONGUE EVERY 5 MINUTES AS NEEDED 12/03/22   Tereso Newcomer T, PA-C  Olmesartan-amLODIPine-HCTZ 40-5-25 MG TABS Take 1 tablet by mouth daily. 12/08/22   Hoy Register, MD  omeprazole (  PRILOSEC) 40 MG capsule TAKE 1 CAPSULE(40 MG) BY MOUTH DAILY 01/22/20   Fulp, Cammie, MD  oxyCODONE-acetaminophen (PERCOCET/ROXICET) 5-325 MG tablet Take 1-2 tablets by mouth every 4 (four) hours as needed for moderate pain. 01/14/23   Tressie Stalker, MD  promethazine (PHENERGAN) 25 MG tablet Take 1 tablet (25 mg total) by mouth every 8 (eight) hours as needed for nausea or vomiting. 12/24/22   Delorse Lek, FNP  REXULTI 2 MG TABS tablet Take 2 mg by mouth daily.    [provider]  spironolactone (ALDACTONE) 25 MG tablet Take 0.5 tablets (12.5 mg total) by mouth daily. 12/08/22   Hoy Register, MD  umeclidinium-vilanterol (ANORO ELLIPTA) 62.5-25 MCG/INH AEPB Inhale 1 puff into the lungs daily. 04/17/19   Jetty Duhamel D, MD  vortioxetine HBr (TRINTELLIX) 20 MG TABS tablet Take 20 mg by mouth daily.    [provider]  escitalopram (LEXAPRO) 20 MG tablet Take 20 mg by mouth daily.  12/14/18  [provider]  LATUDA 120 MG TABS Take 120 mg by mouth at bedtime.  03/04/18 12/14/18  [provider]  LISINOPRIL PO Take by mouth daily.    09/06/11  [provider]  metoCLOPramide (REGLAN) 10 MG tablet Take 1 tablet (10 mg total) by mouth every 8 (eight) hours as needed for nausea. Patient not taking: Reported on 12/06/2019 06/30/18  05/12/20  Mesner, Barbara Cower, MD    Current Facility-Administered Medications  Medication Dose Route Frequency Provider Last Rate Last Admin   [facility] Hold] 0.9 %  sodium chloride infusion (Manually program via Guardrails IV Fluids)   Intravenous Once Therisa Doyne, MD       [facility] Hold] 0.9 %  sodium chloride infusion (Manually program via Guardrails IV Fluids)   Intravenous Once Marrianne Mood, MD       [facility] Hold] 0.9 %  sodium chloride infusion (Manually program via Guardrails IV Fluids)   Intravenous Once Marrianne Mood, MD       0.9 %  sodium chloride infusion   Intravenous Continuous Therisa Doyne, MD   Held at 01/31/23 0334   [MAR Hold] acetaminophen (TYLENOL) tablet 650 mg  650 mg Oral Q6H PRN Therisa Doyne, MD       Or   [facility] Hold] acetaminophen (TYLENOL) suppository 650 mg  650 mg Rectal Q6H PRN Doutova, Jonny Ruiz, MD       [MAR Hold] albuterol (PROVENTIL) (2.5 MG/3ML) 0.083% nebulizer solution 2.5 mg  2.5 mg Nebulization Q2H PRN Doutova, Jonny Ruiz, MD       [MAR Hold] cefTRIAXone (ROCEPHIN) 1 g in sodium chloride 0.9 % 100 mL IVPB  1 g Intravenous Q24H Therisa Doyne, MD   Stopped at 01/31/23 0241   [MAR Hold] Chlorhexidine Gluconate Cloth 2 % PADS 6 each  6 each Topical Q0600 Hunsucker, Lesia Sago, MD   6 each at 01/31/23 0643   [facility] Hold] dextrose 5 % bolus 250 mL  250 mL Intravenous PRN Sundil, Subrina, MD       [facility] Hold] insulin aspart (novoLOG) injection 0-9 Units  0-9 Units Subcutaneous Q4H Doutova, Anastassia, MD       [MAR Hold] levothyroxine (SYNTHROID) tablet 150 mcg  150 mcg Oral Q0600 Therisa Doyne, MD       [MAR Hold] LORazepam (ATIVAN) injection 1 mg  1 mg Intravenous PRN Therisa Doyne, MD   1 mg at 01/30/23 2012   [MAR Hold] naloxone (NARCAN) injection 0.4 mg  0.4 mg Intravenous PRN Hunsucker, Lesia Sago, MD   0.4  mg at 01/31/23 0102   [MAR Hold] nicotine (NICODERM CQ - dosed in mg/24 hr) patch 7 mg  7 mg Transdermal Daily Hunsucker,  Lesia Sago, MD       octreotide (SANDOSTATIN) 500 mcg in sodium chloride 0.9 % 250 mL (2 mcg/mL) infusion  50 mcg/hr Intravenous Continuous Karie Fetch P, DO 25 mL/hr at 01/31/23 0900 50 mcg/hr at 01/31/23 0900   [MAR Hold] ondansetron (ZOFRAN) tablet 4 mg  4 mg Oral Q6H PRN Therisa Doyne, MD       Or   [facility] Hold] ondansetron (ZOFRAN) injection 4 mg  4 mg Intravenous Q6H PRN Therisa Doyne, MD       [MAR Hold] pantoprazole (PROTONIX) injection 40 mg  40 mg Intravenous Q12H Doutova, Anastassia, MD       pantoprozole (PROTONIX) 80 mg /NS 100 mL infusion  8 mg/hr Intravenous Continuous Doutova, Anastassia, MD 10 mL/hr at 01/31/23 0900 8 mg/hr at 01/31/23 0900   [MAR Hold] umeclidinium-vilanterol (ANORO ELLIPTA) 62.5-25 MCG/ACT 1 puff  1 puff Inhalation Daily Therisa Doyne, MD        Allergies as of 01/30/2023 - Reviewed 01/30/2023  Allergen Reaction Noted   Aspirin Other (See Comments), Shortness Of Breath, and Anaphylaxis 01/18/2011   Bee venom Anaphylaxis 01/29/2019   Sulfa antibiotics Anaphylaxis 06/21/2012   Evolocumab  06/02/2021   Other Other (See Comments) 11/21/2014    Family History  Problem Relation Age of Onset   Cancer Father        colon   Stroke Father    Heart disease Father    Diabetes Father    Hypertension Father    Depression Sister    Hypertension Mother    Breast cancer Neg Hx     Social History   Socioeconomic History   Marital status: Single    Spouse name: Not on file   Number of children: Not on file   Years of education: Not on file   Highest education level: Not on file  Occupational History   Not on file  Tobacco Use   Smoking status: Former    Current packs/day: 0.25    Average packs/day: 0.2 packs/day for 44.1 years (11.0 ttl pk-yrs)    Types: Cigarettes    Start date: 79    Quit date: 2020   Smokeless tobacco: Never  Vaping Use   Vaping status: Never Used  Substance and Sexual Activity   Alcohol use: Not Currently     Comment: social   Drug use: No   Sexual activity: Yes    Birth control/protection: Surgical  Other Topics Concern   Not on file  Social History Narrative   Not on file   Social Determinants of Health   Financial Resource Strain: Medium Risk (11/03/2022)   Overall Financial Resource Strain (CARDIA)    Difficulty of Paying Living Expenses: Somewhat hard  Food Insecurity: Food Insecurity Present (11/03/2022)   Hunger Vital Sign    Worried About Running Out of Food in the Last Year: Sometimes true    Ran Out of Food in the Last Year: Sometimes true  Transportation Needs: Unmet Transportation Needs (11/03/2022)   PRAPARE - Transportation    Lack of Transportation (Medical): Yes    Lack of Transportation (Non-Medical): Yes  Physical Activity: Inactive (11/03/2022)   Exercise Vital Sign    Days of Exercise per Week: 0 days    Minutes of Exercise per Session: 0 min  Stress: Stress Concern Present (11/03/2022)   [employer]  of Occupational Health - Occupational Stress Questionnaire    Feeling of Stress : To some extent  Social Connections: Moderately Integrated (11/03/2022)   Social Connection and Isolation Panel [NHANES]    Frequency of Communication with Friends and Family: More than three times a week    Frequency of Social Gatherings with Friends and Family: Once a week    Attends Religious Services: 1 to 4 times per year    Active Member of [employer] or Organizations: No    Attends Banker Meetings: Never    Marital Status: Living with partner  Intimate Partner Violence: Not At Risk (11/03/2022)   Humiliation, Afraid, Rape, and Kick questionnaire    Fear of Current or Ex-Partner: No    Emotionally Abused: No    Physically Abused: No    Sexually Abused: No    Review of Systems: Unable to obtain due to alerted mental status.  Physical Exam: Vital signs in last 24 hours: Temp:  [97.2 F (36.2 C)-99.9 F (37.7 C)] 97.8 F (36.6 C) (08/19 0938) Pulse Rate:   [84-243] 99 (08/19 0938) Resp:  [13-28] 16 (08/19 0938) BP: (99-130)/(48-101) 130/72 (08/19 0938) SpO2:  [72 %-100 %] 94 % (08/19 0938) Weight:  [103.1 kg] 103.1 kg (08/19 0938)   General:   Alert,  overweight, vacillating mental status Head:  Normocephalic and atraumatic. Eyes:  Sclera clear, no icterus.   Conjunctiva pale Ears:  Normal auditory acuity. Nose:  No deformity, discharge,  or lesions. Mouth:  No deformity or lesions.  Oropharynx pale and dry Neck:  Supple; no masses or thyromegaly. Lungs:  No respiratory distress Abdomen:  Soft, protuberant, nontender and nondistended. No masses, hepatosplenomegaly or hernias noted. Normal bowel sounds, without guarding, and without rebound.     Msk:  Symmetrical without gross deformities. Normal posture. Pulses:  Normal pulses noted. Extremities:  Without clubbing or edema. Neurologic:  Not alert; not reliably following commands Skin:  Intact without significant lesions or rashes. Cervical Nodes:  No significant cervical adenopathy. Psych: Vacillating mental status   Lab Results: Recent Labs    01/30/23 1954 01/30/23 2305 01/31/23 0059 01/31/23 0607  WBC 13.8*  --  6.2 8.4  HGB 3.0*  --  6.5* 7.1*  HCT 10.0*  --  20.0* 21.0*  PLT 350 259 260 214   BMET Recent Labs    01/30/23 1858 01/31/23 0059 01/31/23 0406  NA 133* 134* 135  K 4.4 4.2 3.8  CL 98 102 102  CO2 8* 16* 17*  GLUCOSE 140* 125* 97  BUN 68* 67* 72*  CREATININE 2.61* 2.52* 2.37*  CALCIUM 7.8* 7.9* 7.5*   LFT Recent Labs    01/30/23 1933 01/31/23 0406  PROT 6.0* 5.6*  ALBUMIN 2.9* 2.8*  AST 52* 63*  ALT 29 33  ALKPHOS 72 72  BILITOT 0.7 0.6  BILIDIR <0.1  --   IBILI NOT CALCULATED  --    PT/INR Recent Labs    01/30/23 2305  LABPROT 16.6*  INR 1.3*    Studies/Results: CT ANGIO GI BLEED  Result Date: 01/30/2023 CLINICAL DATA:  Bloody diarrhea. Concern for acute GI bleed or mesenteric ischemia. EXAM: CTA ABDOMEN AND PELVIS WITHOUT AND  WITH CONTRAST TECHNIQUE: Multidetector CT imaging of the abdomen and pelvis was performed using the standard protocol during bolus administration of intravenous contrast. Multiplanar reconstructed images and MIPs were obtained and reviewed to evaluate the vascular anatomy. RADIATION DOSE REDUCTION: This exam was performed according to the departmental dose-optimization program which  includes automated exposure control, adjustment of the mA and/or kV according to patient size and/or use of iterative reconstruction technique. CONTRAST:  75mL OMNIPAQUE IOHEXOL 350 MG/ML SOLN COMPARISON:  09/10/2021 FINDINGS: VASCULAR Aorta: Normal caliber aorta without aneurysm, dissection, vasculitis or significant stenosis. Scattered aortic calcifications. Celiac: Patent without evidence of aneurysm, dissection, vasculitis or significant stenosis. SMA: Patent without evidence of aneurysm, dissection, vasculitis or significant stenosis. Renals: Both renal arteries are patent without evidence of aneurysm, dissection, vasculitis, fibromuscular dysplasia or significant stenosis. IMA: Patent without evidence of aneurysm, dissection, vasculitis or significant stenosis. Inflow: Atherosclerotic calcifications. No aneurysm or dissection. No stenosis. Proximal Outflow: Bilateral common femoral and visualized portions of the superficial and profunda femoral arteries are patent without evidence of aneurysm, dissection, vasculitis or significant stenosis. Veins: No obvious venous abnormality within the limitations of this arterial phase study. Review of the MIP images confirms the above findings. NON-VASCULAR Lower chest: No acute findings Hepatobiliary: Stable left hepatic cyst which appears benign. No suspicious focal hepatic abnormality. Gallbladder unremarkable. Pancreas: No focal abnormality or ductal dilatation. Spleen: No focal abnormality.  Normal size. Adrenals/Urinary Tract: Diffuse enlargement of the left adrenal gland. Right adrenal  nodule is unchanged. No renal or ureteral stones. No renal or ureteral stones. No hydronephrosis. Urinary bladder unremarkable. Stomach/Bowel: High-density material throughout the colon making it difficult to assess for contrast extravasation. No obvious contrast extravasation. Moderate stool burden throughout the colon. No abnormal wall thickening to suggest ischemia. No evidence of bowel obstruction. Changes of gastric bypass. Lymphatic: No adenopathy Reproductive: Prior hysterectomy.  No adnexal masses. Other: No free fluid or free air. Musculoskeletal: Postoperative changes in the lumbar spine. Fluid collection in the midline of the back in the subcutaneous soft tissues measuring 5.1 x 3.7 cm compatible with postoperative fluid collection. No acute bony abnormality. IMPRESSION: VASCULAR Scattered aortoiliac atherosclerosis. No significant stenosis, aneurysm or dissection. Difficult to assess for GI bleed within the colon due to ingested high-density material throughout the colon. No obvious or large GI bleed visualized. NON-VASCULAR No changes of mesenteric ischemia noted. Mild gaseous distention of the colon moderate stool burden. Electronically Signed   By: Charlett Nose M.D.   On: 01/30/2023 22:19   DG Chest Port 1 View  Result Date: 01/30/2023 CLINICAL DATA:  Altered mental status EXAM: PORTABLE CHEST 1 VIEW COMPARISON:  09/09/2021 FINDINGS: Low volume AP portable examination. Cardiomegaly. Both lungs are clear. The visualized skeletal structures are unremarkable. IMPRESSION: Low volume AP portable examination. Cardiomegaly without acute abnormality of the lungs. Electronically Signed   By: Jearld Lesch M.D.   On: 01/30/2023 20:06    Impression:   Melena. Acute blood loss anemia. Vacillating mental status.  Plan:   PPI. Volume repletion  Emergent endoscopy.   Patient unable to provide consent; we are doing procedure under emergency circumstances with two doctors providing signatures for  emergency case (I have signed and Dr. Chestine Spore PCCM has signed). Risks (bleeding, infection, bowel perforation that could require surgery, sedation-related changes in cardiopulmonary systems), benefits (identification and possible treatment of source of symptoms, exclusion of certain causes of symptoms), and alternatives (watchful waiting, radiographic imaging studies, empiric medical treatment) of upper endoscopy (EGD).  Emergency consent (see above).   LOS: 0 days   Maleka Contino M  01/31/2023, 10:05 AM  Cell [phone] If no answer or after 5 PM call [phone]

## 2023-01-31 NOTE — Assessment & Plan Note (Signed)
In the setting of profound anemia Will transfuse for total of 4 units Rehydrate And follow lactic acid Admit to progressive Appreciate intensivist consult as patient is critically ill

## 2023-01-31 NOTE — ED Notes (Addendum)
PT HAD LARGE LIQUID BLACK BM. ALL OVER BOTTOM AND ALL OVER GOWN. STRONG MALODOR. CLEANSED AND CHANGED PT AT THIS TIME.

## 2023-01-31 NOTE — Op Note (Signed)
[facility] Patient Name: Caroline Williams Procedure Date : 01/31/2023 MRN: [id number] Attending MD: Willis Modena , MD, [id number] Date of Birth: [birth year] CSN: [id number] Age: 62 Admit Type: Inpatient Procedure:                Upper GI endoscopy Indications:              Acute post hemorrhagic anemia, Melena Providers:                Willis Modena, MD, Marja Kays, Technician,                            Glory Rosebush, RN Referring MD:             PCCM Medicines:                Monitored Anesthesia Care Complications:            No immediate complications. Estimated Blood Loss:     Estimated blood loss: none. Procedure:                Pre-Anesthesia Assessment:                           - Prior to the procedure, a History and Physical                            was performed, and patient medications and                            allergies were reviewed. The patient's tolerance of                            previous anesthesia was also reviewed. The risks                            and benefits of the procedure and the sedation                            options and risks were discussed with the patient.                            All questions were answered, and informed consent                            was obtained. Prior Anticoagulants: The patient has                            taken no anticoagulant or antiplatelet agents. ASA                            Grade Assessment: IV - A patient with severe                            systemic disease that is a constant threat to life.  After reviewing the risks and benefits, the patient                            was deemed in satisfactory condition to undergo the                            procedure.                           After obtaining informed consent, the endoscope was                            passed under direct vision. Throughout the                            procedure, the  patient's blood pressure, pulse, and                            oxygen saturations were monitored continuously. The                            [id number]-H190 ([id number]) Olympus endoscope was introduced                            through the mouth, and advanced to the afferent and                            efferent jejunal loops. The upper GI endoscopy was                            accomplished without difficulty. The patient                            tolerated the procedure well. Scope In: Scope Out: Findings:      A small hiatal hernia was present.      The exam of the esophagus was otherwise normal.      Gastric pouch was normal. Mild erosions at the gastrojejunal       anastomosis. No blood.      The examined efferent and afferent jejunal limbs were examined for about       10-15 cm each, and each was normal. No old or fresh blood seen. Impression:               - Small hiatal hernia.                           - Roux-en-Y anatomy with normal gastric pouch.                           - Small erosions at gastrojejunal anastomosis.                           - Normal afferent and efferent jejunal limbs; no  old or fresh blood seen to extent of our                            examination.                           - Suspect mid-to-distal small bowel versus colonic                            source of bleeding. There are some spurious reports                            of hematemesis but patient unable to corroborate                            this due to her altered mental status and she has                            had no hematemesis since in hsopital. Recommendation:           - Return patient to ICU for ongoing care.                           - NPO until further notice.                           - Continue present medications.                           - Not candidate for prep (oral or NGT-mediated) in                            her current clinical mental  status.                           - Consider repeat CT angiogram abd/pelvis if                            ongoing GI bleeding.                           - [facility] will follow. Procedure Code(s):        --- Professional ---                           [phone], Esophagogastroduodenoscopy, flexible,                            transoral; diagnostic, including collection of                            specimen(s) by brushing or washing, when performed                            (separate procedure) Diagnosis Code(s):        --- Professional ---  K44.9, Diaphragmatic hernia without obstruction or                            gangrene                           D62, Acute posthemorrhagic anemia                           K92.1, Melena (includes Hematochezia) CPT copyright 2022 [employer]. All rights reserved. The codes documented in this report are preliminary and upon coder review may  be revised to meet current compliance requirements. Willis Modena, MD 01/31/2023 11:06:02 AM This report has been signed electronically. Number of Addenda: 0

## 2023-01-31 NOTE — Assessment & Plan Note (Signed)
-   Glasgow Blatchford score BUN >18.2   Hg < <48F   melena   >1 Justifies admission and aggressive management     hemodynamic instability present      -  Admit to stepdown given above ER provider discussed case with critical care medicine Patient noted to have hemoglobin of 3 Lactic acid of 14 Initially soft blood pressures now improving after blood transfusion At this point critical care recommended medicine admission    -  ER  Provider spoke to gastroenterology [facility] ) they will see patient in a.m. appreciate their consult   - serial CBC.    - Monitor very closely in progressive if patient decompensates will need to reconsult critical care medicine as patient is very tenuous  - Transfuse for total of 4 units continue to monitor serial CBC If decompensates further may need emergent GI reevaluation  - Establish at least 2 PIV and fluid resuscitate   - clear liquids for tonight keep nothing by mouth post midnight,   -  administer Protonix  drip

## 2023-01-31 NOTE — Progress Notes (Signed)
eLink Physician-Brief Progress Note Patient Name: LAMEIKA QASEM DOB: 08-27-1960 MRN: [id number]   Date of Service  01/31/2023  HPI/Events of Note  62 y.o. female with medical history significant of CAD and anxiety asthma bipolar disorder COPD DM2 hypertension hypothyroidism. Presents w/ Melena x4days, abdominal pain, and nonbloody emesis. +acidosis, anemia, AKI.   eICU Interventions  PPI IV, ceftriaxone prophylaxis (although no evidence of cirrhosis), pending complete GI eval.  Protecting her airway but lethargic  Hold DVT prophylaxis Holding most home meds including antihypertensives and chronic pain meds  Actively undergoing ground team eval.      Intervention Category Evaluation Type: New Patient Evaluation  Peyton Spengler 01/31/2023, 7:07 AM

## 2023-01-31 NOTE — Progress Notes (Addendum)
PT Cancellation Note  Patient Details Name: TENASHA EGLE MRN: [id number] DOB: 1961-03-16   Cancelled Treatment:    Reason Eval/Treat Not Completed: Patient at procedure or test/unavailable 1015  Attempted again 1250 with RN stating pt not appropriate today   Malania Gawthrop B Edwardine Deschepper 01/31/2023, 10:15 AM Merryl Hacker, PT Acute Rehabilitation Services Office: [phone]

## 2023-01-31 NOTE — Anesthesia Postprocedure Evaluation (Signed)
Anesthesia Post Note  Patient: ARION MATTOX  Procedure(s) Performed: ESOPHAGOGASTRODUODENOSCOPY (EGD) WITH PROPOFOL (Left)     Patient location during evaluation: PACU Anesthesia Type: MAC Level of consciousness: awake and alert Pain management: pain level controlled Vital Signs Assessment: post-procedure vital signs reviewed and stable Respiratory status: spontaneous breathing, nonlabored ventilation, respiratory function stable and patient connected to nasal cannula oxygen Cardiovascular status: stable and blood pressure returned to baseline Postop Assessment: no apparent nausea or vomiting Anesthetic complications: no   No notable events documented.  Last Vitals:  Vitals:   01/31/23 1409 01/31/23 1415  BP: (!) 154/87 (!) 160/100  Pulse: (!) 106 (!) 104  Resp: 17 15  Temp: 37.1 C   SpO2: 94% 94%    Last Pain:  Vitals:   01/31/23 1409  TempSrc: Oral  PainSc:                  Nelle Don [place]

## 2023-01-31 NOTE — Assessment & Plan Note (Signed)
Order sliding scale hold p.o. medications for now hold Farxiga check beta hydroxybutyric acid

## 2023-01-31 NOTE — Consult Note (Signed)
[facility] Consultation Note  Referring Provider: Pulmonary Critical Care Primary Care Physician:  Hoy Register, MD Primary Gastroenterologist:  Gentry Fitz  Reason for Consultation:  melena, anemia  HPI: Caroline Williams is a 62 y.o. female presenting anemia, altered mental status, melena.  Had few days of hematemesis prior per report, but no hematemesis since her arrival to hospital.  Improved with volume resuscitation.  Unable to obtain any further details due to altered mental status.   Past Medical History:  Diagnosis Date   Anxiety    Arthritis    Asthma    Bipolar 1 disorder (HCC)    Breast discharge    Breast lump    Breast pain    CAD (coronary artery disease) 12/03/2022    o Cath 2008: no CAD   o CCTA 10/05/19: CAC score 49, 88th percentile, nonobstructive CAD (LM 1-24 mid and dist)  o Myoview 12/31/20: EF 45, no ischemia or infarction, low risk    Chronic pain    on MS Contin and oxycodone   COPD (chronic obstructive pulmonary disease) (HCC)    Depression    Diabetes mellitus    diet and exercise controlled   Dysrhythmia    HR in 30s on metoprolol   Escherichia coli (E. coli) infection    Fever    GERD (gastroesophageal reflux disease)    History of chest pain    History of knee replacement, total    Hypertension    Hypothyroidism    N&V (nausea and vomiting)    Peripheral neuropathy    Right foot no sensation   Sciatica    Sleep apnea    does not use CPAP   Thyroid disease    Wears glasses     Past Surgical History:  Procedure Laterality Date   ABDOMINAL HYSTERECTOMY     partial   APPENDECTOMY     BIOPSY  03/14/2018   Procedure: BIOPSY;  Surgeon: Charlott Rakes, MD;  Location: [facility];  Service: Endoscopy;;   BREAST DUCTAL SYSTEM EXCISION Right 08/31/2017   Procedure: RIGHT BREAST CENTRAL DUCT EXCISION;  Surgeon: Harriette Bouillon, MD;  Location: [facility];  Service: General;  Laterality: Right;   BREAST EXCISIONAL  BIOPSY Right    BREAST LUMPECTOMY     right   CARDIAC CATHETERIZATION     no significant CAD, nl LV function by 11/08/06 cath   CESAREAN SECTION     x4   COLONOSCOPY WITH PROPOFOL N/A 03/14/2018   Procedure: COLONOSCOPY WITH PROPOFOL;  Surgeon: Charlott Rakes, MD;  Location: [facility];  Service: Endoscopy;  Laterality: N/A;   GASTRIC ROUX-EN-Y N/A 11/25/2014   Procedure: LAPAROSCOPIC ROUX-EN-Y GASTRIC BYPASS WITH UPPER ENDOSCOPY;  Surgeon: Luretha Murphy, MD;  Location: [facility];  Service: General;  Laterality: N/A;   HERNIA REPAIR     left inguinal   JOINT REPLACEMENT     KNEE SURGERY     right knee   MOUTH SURGERY     MYOMECTOMY ABDOMINAL APPROACH  1989   POLYPECTOMY  03/14/2018   Procedure: POLYPECTOMY;  Surgeon: Charlott Rakes, MD;  Location: [facility];  Service: Endoscopy;;   TOTAL KNEE REVISION  06/21/2012   Procedure: TOTAL KNEE REVISION;  Surgeon: Nadara Mustard, MD;  Location: [facility];  Service: Orthopedics;  Laterality: Right;  Revision Right Total Knee Arthroplasty    Prior to Admission medications   Medication Sig Start Date End Date Taking? Authorizing Provider  dapagliflozin propanediol (FARXIGA) 5 MG TABS tablet Take 1  tablet (5 mg total) by mouth daily before breakfast. 10/07/22  Yes Newlin, Enobong, MD  solifenacin (VESICARE) 5 MG tablet Take 1 tablet (5 mg total) by mouth daily. 10/07/22  Yes Newlin, Enobong, MD  albuterol (VENTOLIN HFA) 108 (90 Base) MCG/ACT inhaler INHALE 2 PUFFS INTO THE LUNGS EVERY 6 HOURS AS NEEDED FOR WHEEZING OR SHORTNESS OF BREATH 09/23/21   Hoy Register, MD  Alirocumab (PRALUENT) 150 MG/ML SOAJ INJECT 1 PEN INTO SKIN EVERY 14 DAYS. 12/03/22   Tereso Newcomer T, PA-C  ALPRAZolam Prudy Feeler) 1 MG tablet Take 1 mg by mouth 3 (three) times daily as needed for anxiety. 07/02/14   [provider]  amphetamine-dextroamphetamine (ADDERALL XR) 30 MG 24 hr capsule Take 30 mg by mouth daily.  03/10/18   [provider]   amphetamine-dextroamphetamine (ADDERALL) 20 MG tablet Take 20 mg by mouth every evening. 10/13/17   [provider]  Blood Glucose Monitoring Suppl (ACCU-CHEK GUIDE) w/Device KIT 1 kit by Does not apply route 3 (three) times daily. To check blood sugars 04/10/21   Hoy Register, MD  cetirizine (ZYRTEC) 10 MG tablet Take 1 tablet (10 mg total) by mouth daily. 04/07/21   Hoy Register, MD  Cyanocobalamin (VITAMIN B 12 PO) Take 1 tablet by mouth daily. Unsure of dose    [provider]  cyclobenzaprine (FLEXERIL) 10 MG tablet Take 1 tablet (10 mg total) by mouth 3 (three) times daily as needed for muscle spasms. 01/14/23   Tressie Stalker, MD  diclofenac Sodium (VOLTAREN) 1 % GEL Apply 2-4 g topically 2 (two) times daily as needed (wrist/knee pain). 11/23/21   Hoy Register, MD  docusate sodium (COLACE) 100 MG capsule Take 1 capsule (100 mg total) by mouth 2 (two) times daily. 01/14/23   Tressie Stalker, MD  doxepin (SINEQUAN) 75 MG capsule Take 75 mg by mouth at bedtime. 03/02/18   [provider]  fluticasone (FLONASE) 50 MCG/ACT nasal spray SHAKE LIQUID AND USE TWO SPRAYS IN EACH NOSTRIL DAILY 12/21/22   Hoy Register, MD  glucose blood (ACCU-CHEK GUIDE) test strip Use as instructed 04/10/21   Hoy Register, MD  hydrocortisone-pramoxine (ANALPRAM HC) 2.5-1 % rectal cream Place 1 application rectally 3 (three) times daily. 07/13/21   Hoy Register, MD  isosorbide mononitrate (IMDUR) 30 MG 24 hr tablet Take 1 tablet (30 mg total) by mouth daily. 12/11/20   Lyn Records, MD  Lancets (ACCU-CHEK MULTICLIX) lancets Use as instructed 04/10/21   Hoy Register, MD  levothyroxine (SYNTHROID) 150 MCG tablet TAKE 1 TABLET(150 MCG) BY MOUTH DAILY BEFORE BREAKFAST 05/10/22   Hoy Register, MD  linaclotide (LINZESS) 290 MCG CAPS capsule Take 290 mcg by mouth See admin instructions. Every other night    [provider]  metoprolol succinate (TOPROL XL) 25 MG 24 hr tablet  Take 0.5 tablets (12.5 mg total) by mouth at bedtime. Patient not taking: Reported on 01/11/2023 01/03/23   Tereso Newcomer T, PA-C  morphine (MS CONTIN) 15 MG 12 hr tablet Take 1 tablet (15 mg total) by mouth every 12 (twelve) hours. 11/14/14   Jones Bales, NP  naloxone [facility]) nasal spray 4 mg/0.1 mL Place 1 spray into the nose as needed (overdose). 05/22/20   [provider]  nitroGLYCERIN (NITROSTAT) 0.4 MG SL tablet DISSOLVE 1 TABLET UNDER THE TONGUE EVERY 5 MINUTES AS NEEDED 12/03/22   Tereso Newcomer T, PA-C  Olmesartan-amLODIPine-HCTZ 40-5-25 MG TABS Take 1 tablet by mouth daily. 12/08/22   Hoy Register, MD  omeprazole (  PRILOSEC) 40 MG capsule TAKE 1 CAPSULE(40 MG) BY MOUTH DAILY 01/22/20   Fulp, Cammie, MD  oxyCODONE-acetaminophen (PERCOCET/ROXICET) 5-325 MG tablet Take 1-2 tablets by mouth every 4 (four) hours as needed for moderate pain. 01/14/23   Tressie Stalker, MD  promethazine (PHENERGAN) 25 MG tablet Take 1 tablet (25 mg total) by mouth every 8 (eight) hours as needed for nausea or vomiting. 12/24/22   Delorse Lek, FNP  REXULTI 2 MG TABS tablet Take 2 mg by mouth daily.    [provider]  spironolactone (ALDACTONE) 25 MG tablet Take 0.5 tablets (12.5 mg total) by mouth daily. 12/08/22   Hoy Register, MD  umeclidinium-vilanterol (ANORO ELLIPTA) 62.5-25 MCG/INH AEPB Inhale 1 puff into the lungs daily. 04/17/19   Jetty Duhamel D, MD  vortioxetine HBr (TRINTELLIX) 20 MG TABS tablet Take 20 mg by mouth daily.    [provider]  escitalopram (LEXAPRO) 20 MG tablet Take 20 mg by mouth daily.  12/14/18  [provider]  LATUDA 120 MG TABS Take 120 mg by mouth at bedtime.  03/04/18 12/14/18  [provider]  LISINOPRIL PO Take by mouth daily.    09/06/11  [provider]  metoCLOPramide (REGLAN) 10 MG tablet Take 1 tablet (10 mg total) by mouth every 8 (eight) hours as needed for nausea. Patient not taking: Reported on 12/06/2019 06/30/18  05/12/20  Mesner, Barbara Cower, MD    Current Facility-Administered Medications  Medication Dose Route Frequency Provider Last Rate Last Admin   [facility] Hold] 0.9 %  sodium chloride infusion (Manually program via Guardrails IV Fluids)   Intravenous Once Therisa Doyne, MD       [facility] Hold] 0.9 %  sodium chloride infusion (Manually program via Guardrails IV Fluids)   Intravenous Once Marrianne Mood, MD       [facility] Hold] 0.9 %  sodium chloride infusion (Manually program via Guardrails IV Fluids)   Intravenous Once Marrianne Mood, MD       0.9 %  sodium chloride infusion   Intravenous Continuous Therisa Doyne, MD   Held at 01/31/23 0334   [MAR Hold] acetaminophen (TYLENOL) tablet 650 mg  650 mg Oral Q6H PRN Therisa Doyne, MD       Or   [facility] Hold] acetaminophen (TYLENOL) suppository 650 mg  650 mg Rectal Q6H PRN Doutova, Jonny Ruiz, MD       [MAR Hold] albuterol (PROVENTIL) (2.5 MG/3ML) 0.083% nebulizer solution 2.5 mg  2.5 mg Nebulization Q2H PRN Doutova, Jonny Ruiz, MD       [MAR Hold] cefTRIAXone (ROCEPHIN) 1 g in sodium chloride 0.9 % 100 mL IVPB  1 g Intravenous Q24H Therisa Doyne, MD   Stopped at 01/31/23 0241   [MAR Hold] Chlorhexidine Gluconate Cloth 2 % PADS 6 each  6 each Topical Q0600 Hunsucker, Lesia Sago, MD   6 each at 01/31/23 0643   [facility] Hold] dextrose 5 % bolus 250 mL  250 mL Intravenous PRN Sundil, Subrina, MD       [facility] Hold] insulin aspart (novoLOG) injection 0-9 Units  0-9 Units Subcutaneous Q4H Doutova, Anastassia, MD       [MAR Hold] levothyroxine (SYNTHROID) tablet 150 mcg  150 mcg Oral Q0600 Therisa Doyne, MD       [MAR Hold] LORazepam (ATIVAN) injection 1 mg  1 mg Intravenous PRN Therisa Doyne, MD   1 mg at 01/30/23 2012   [MAR Hold] naloxone (NARCAN) injection 0.4 mg  0.4 mg Intravenous PRN Hunsucker, Lesia Sago, MD   0.4  mg at 01/31/23 0102   [MAR Hold] nicotine (NICODERM CQ - dosed in mg/24 hr) patch 7 mg  7 mg Transdermal Daily Hunsucker,  Lesia Sago, MD       octreotide (SANDOSTATIN) 500 mcg in sodium chloride 0.9 % 250 mL (2 mcg/mL) infusion  50 mcg/hr Intravenous Continuous Karie Fetch P, DO 25 mL/hr at 01/31/23 0900 50 mcg/hr at 01/31/23 0900   [MAR Hold] ondansetron (ZOFRAN) tablet 4 mg  4 mg Oral Q6H PRN Therisa Doyne, MD       Or   [facility] Hold] ondansetron (ZOFRAN) injection 4 mg  4 mg Intravenous Q6H PRN Therisa Doyne, MD       [MAR Hold] pantoprazole (PROTONIX) injection 40 mg  40 mg Intravenous Q12H Doutova, Anastassia, MD       pantoprozole (PROTONIX) 80 mg /NS 100 mL infusion  8 mg/hr Intravenous Continuous Doutova, Anastassia, MD 10 mL/hr at 01/31/23 0900 8 mg/hr at 01/31/23 0900   [MAR Hold] umeclidinium-vilanterol (ANORO ELLIPTA) 62.5-25 MCG/ACT 1 puff  1 puff Inhalation Daily Therisa Doyne, MD        Allergies as of 01/30/2023 - Reviewed 01/30/2023  Allergen Reaction Noted   Aspirin Other (See Comments), Shortness Of Breath, and Anaphylaxis 01/18/2011   Bee venom Anaphylaxis 01/29/2019   Sulfa antibiotics Anaphylaxis 06/21/2012   Evolocumab  06/02/2021   Other Other (See Comments) 11/21/2014    Family History  Problem Relation Age of Onset   Cancer Father        colon   Stroke Father    Heart disease Father    Diabetes Father    Hypertension Father    Depression Sister    Hypertension Mother    Breast cancer Neg Hx     Social History   Socioeconomic History   Marital status: Single    Spouse name: Not on file   Number of children: Not on file   Years of education: Not on file   Highest education level: Not on file  Occupational History   Not on file  Tobacco Use   Smoking status: Former    Current packs/day: 0.25    Average packs/day: 0.2 packs/day for 44.1 years (11.0 ttl pk-yrs)    Types: Cigarettes    Start date: 63    Quit date: 2020   Smokeless tobacco: Never  Vaping Use   Vaping status: Never Used  Substance and Sexual Activity   Alcohol use: Not Currently     Comment: social   Drug use: No   Sexual activity: Yes    Birth control/protection: Surgical  Other Topics Concern   Not on file  Social History Narrative   Not on file   Social Determinants of Health   Financial Resource Strain: Medium Risk (11/03/2022)   Overall Financial Resource Strain (CARDIA)    Difficulty of Paying Living Expenses: Somewhat hard  Food Insecurity: Food Insecurity Present (11/03/2022)   Hunger Vital Sign    Worried About Running Out of Food in the Last Year: Sometimes true    Ran Out of Food in the Last Year: Sometimes true  Transportation Needs: Unmet Transportation Needs (11/03/2022)   PRAPARE - Transportation    Lack of Transportation (Medical): Yes    Lack of Transportation (Non-Medical): Yes  Physical Activity: Inactive (11/03/2022)   Exercise Vital Sign    Days of Exercise per Week: 0 days    Minutes of Exercise per Session: 0 min  Stress: Stress Concern Present (11/03/2022)   [employer]  of Occupational Health - Occupational Stress Questionnaire    Feeling of Stress : To some extent  Social Connections: Moderately Integrated (11/03/2022)   Social Connection and Isolation Panel [NHANES]    Frequency of Communication with Friends and Family: More than three times a week    Frequency of Social Gatherings with Friends and Family: Once a week    Attends Religious Services: 1 to 4 times per year    Active Member of [employer] or Organizations: No    Attends Banker Meetings: Never    Marital Status: Living with partner  Intimate Partner Violence: Not At Risk (11/03/2022)   Humiliation, Afraid, Rape, and Kick questionnaire    Fear of Current or Ex-Partner: No    Emotionally Abused: No    Physically Abused: No    Sexually Abused: No    Review of Systems: Unable to obtain due to alerted mental status.  Physical Exam: Vital signs in last 24 hours: Temp:  [97.2 F (36.2 C)-99.9 F (37.7 C)] 97.8 F (36.6 C) (08/19 0938) Pulse Rate:   [84-243] 99 (08/19 0938) Resp:  [13-28] 16 (08/19 0938) BP: (99-130)/(48-101) 130/72 (08/19 0938) SpO2:  [72 %-100 %] 94 % (08/19 0938) Weight:  [103.1 kg] 103.1 kg (08/19 0938)   General:   Alert,  overweight, vacillating mental status Head:  Normocephalic and atraumatic. Eyes:  Sclera clear, no icterus.   Conjunctiva pale Ears:  Normal auditory acuity. Nose:  No deformity, discharge,  or lesions. Mouth:  No deformity or lesions.  Oropharynx pale and dry Neck:  Supple; no masses or thyromegaly. Lungs:  No respiratory distress Abdomen:  Soft, protuberant, nontender and nondistended. No masses, hepatosplenomegaly or hernias noted. Normal bowel sounds, without guarding, and without rebound.     Msk:  Symmetrical without gross deformities. Normal posture. Pulses:  Normal pulses noted. Extremities:  Without clubbing or edema. Neurologic:  Not alert; not reliably following commands Skin:  Intact without significant lesions or rashes. Cervical Nodes:  No significant cervical adenopathy. Psych: Vacillating mental status   Lab Results: Recent Labs    01/30/23 1954 01/30/23 2305 01/31/23 0059 01/31/23 0607  WBC 13.8*  --  6.2 8.4  HGB 3.0*  --  6.5* 7.1*  HCT 10.0*  --  20.0* 21.0*  PLT 350 259 260 214   BMET Recent Labs    01/30/23 1858 01/31/23 0059 01/31/23 0406  NA 133* 134* 135  K 4.4 4.2 3.8  CL 98 102 102  CO2 8* 16* 17*  GLUCOSE 140* 125* 97  BUN 68* 67* 72*  CREATININE 2.61* 2.52* 2.37*  CALCIUM 7.8* 7.9* 7.5*   LFT Recent Labs    01/30/23 1933 01/31/23 0406  PROT 6.0* 5.6*  ALBUMIN 2.9* 2.8*  AST 52* 63*  ALT 29 33  ALKPHOS 72 72  BILITOT 0.7 0.6  BILIDIR <0.1  --   IBILI NOT CALCULATED  --    PT/INR Recent Labs    01/30/23 2305  LABPROT 16.6*  INR 1.3*    Studies/Results: CT ANGIO GI BLEED  Result Date: 01/30/2023 CLINICAL DATA:  Bloody diarrhea. Concern for acute GI bleed or mesenteric ischemia. EXAM: CTA ABDOMEN AND PELVIS WITHOUT AND  WITH CONTRAST TECHNIQUE: Multidetector CT imaging of the abdomen and pelvis was performed using the standard protocol during bolus administration of intravenous contrast. Multiplanar reconstructed images and MIPs were obtained and reviewed to evaluate the vascular anatomy. RADIATION DOSE REDUCTION: This exam was performed according to the departmental dose-optimization program which  includes automated exposure control, adjustment of the mA and/or kV according to patient size and/or use of iterative reconstruction technique. CONTRAST:  75mL OMNIPAQUE IOHEXOL 350 MG/ML SOLN COMPARISON:  09/10/2021 FINDINGS: VASCULAR Aorta: Normal caliber aorta without aneurysm, dissection, vasculitis or significant stenosis. Scattered aortic calcifications. Celiac: Patent without evidence of aneurysm, dissection, vasculitis or significant stenosis. SMA: Patent without evidence of aneurysm, dissection, vasculitis or significant stenosis. Renals: Both renal arteries are patent without evidence of aneurysm, dissection, vasculitis, fibromuscular dysplasia or significant stenosis. IMA: Patent without evidence of aneurysm, dissection, vasculitis or significant stenosis. Inflow: Atherosclerotic calcifications. No aneurysm or dissection. No stenosis. Proximal Outflow: Bilateral common femoral and visualized portions of the superficial and profunda femoral arteries are patent without evidence of aneurysm, dissection, vasculitis or significant stenosis. Veins: No obvious venous abnormality within the limitations of this arterial phase study. Review of the MIP images confirms the above findings. NON-VASCULAR Lower chest: No acute findings Hepatobiliary: Stable left hepatic cyst which appears benign. No suspicious focal hepatic abnormality. Gallbladder unremarkable. Pancreas: No focal abnormality or ductal dilatation. Spleen: No focal abnormality.  Normal size. Adrenals/Urinary Tract: Diffuse enlargement of the left adrenal gland. Right adrenal  nodule is unchanged. No renal or ureteral stones. No renal or ureteral stones. No hydronephrosis. Urinary bladder unremarkable. Stomach/Bowel: High-density material throughout the colon making it difficult to assess for contrast extravasation. No obvious contrast extravasation. Moderate stool burden throughout the colon. No abnormal wall thickening to suggest ischemia. No evidence of bowel obstruction. Changes of gastric bypass. Lymphatic: No adenopathy Reproductive: Prior hysterectomy.  No adnexal masses. Other: No free fluid or free air. Musculoskeletal: Postoperative changes in the lumbar spine. Fluid collection in the midline of the back in the subcutaneous soft tissues measuring 5.1 x 3.7 cm compatible with postoperative fluid collection. No acute bony abnormality. IMPRESSION: VASCULAR Scattered aortoiliac atherosclerosis. No significant stenosis, aneurysm or dissection. Difficult to assess for GI bleed within the colon due to ingested high-density material throughout the colon. No obvious or large GI bleed visualized. NON-VASCULAR No changes of mesenteric ischemia noted. Mild gaseous distention of the colon moderate stool burden. Electronically Signed   By: Charlett Nose M.D.   On: 01/30/2023 22:19   DG Chest Port 1 View  Result Date: 01/30/2023 CLINICAL DATA:  Altered mental status EXAM: PORTABLE CHEST 1 VIEW COMPARISON:  09/09/2021 FINDINGS: Low volume AP portable examination. Cardiomegaly. Both lungs are clear. The visualized skeletal structures are unremarkable. IMPRESSION: Low volume AP portable examination. Cardiomegaly without acute abnormality of the lungs. Electronically Signed   By: Jearld Lesch M.D.   On: 01/30/2023 20:06    Impression:   Melena. Acute blood loss anemia. Vacillating mental status.  Plan:   PPI. Volume repletion  Emergent endoscopy.   Patient unable to provide consent; we are doing procedure under emergency circumstances with two doctors providing signatures for  emergency case (I have signed and Dr. Chestine Spore PCCM has signed). Risks (bleeding, infection, bowel perforation that could require surgery, sedation-related changes in cardiopulmonary systems), benefits (identification and possible treatment of source of symptoms, exclusion of certain causes of symptoms), and alternatives (watchful waiting, radiographic imaging studies, empiric medical treatment) of upper endoscopy (EGD).  Emergency consent (see above).   LOS: 0 days   Brinda Focht M  01/31/2023, 10:05 AM  Cell [phone] If no answer or after 5 PM call [phone]

## 2023-01-31 NOTE — Progress Notes (Signed)
Finished 4th unit of pRBC, still HR ~105 and continued to have melena overnight.   Giving 1 more RBC + 1 cyro, FFP, platelets, TXA, 2g Ca+. Getting third IV.  EGD planned for later today.  Steffanie Dunn, DO 01/31/23 7:59 AM [facility]  For contact information, see Amion. If no response to pager, please call PCCM consult pager. After hours, 7PM- 7AM, please call Elink.

## 2023-01-31 NOTE — Assessment & Plan Note (Signed)
Allow permissive hypertension 

## 2023-01-31 NOTE — Assessment & Plan Note (Signed)
-  chronic avoid nephrotoxic medications such as NSAIDs, Vanco Zosyn combo,  avoid hypotension, continue to follow renal function  

## 2023-01-31 NOTE — Interval H&P Note (Signed)
History and Physical Interval Note:  01/31/2023 10:11 AM  Caroline Williams  has presented today for surgery, with the diagnosis of melena, anemia.  The various methods of treatment have been discussed with the patient and family. After consideration of risks, benefits and other options for treatment, the patient has consented to  Procedure(s): ESOPHAGOGASTRODUODENOSCOPY (EGD) WITH PROPOFOL (Left) as a surgical intervention.  The patient's history has been reviewed, patient examined, no change in status, stable for surgery.  I have reviewed the patient's chart and labs.  Questions were answered to the patient's satisfaction.     Freddy Jaksch

## 2023-01-31 NOTE — Assessment & Plan Note (Signed)
Transfuse for hemoglobin of 8 hold off on any aspirin for now

## 2023-01-31 NOTE — Assessment & Plan Note (Addendum)
In the setting of severe blood loss Rehydrate follow obtain urine electrolytes

## 2023-01-31 NOTE — Progress Notes (Addendum)
NAME:  Caroline Williams, MRN:  [id number], DOB:  10-20-1960, LOS: 0 ADMISSION DATE:  01/30/2023  History of Present Illness:  62 year old female with history of coronary artery disease, COPD, GERD, thyroid disease presents after several days of abdominal pain, vomiting and dark stool.  Not on blood thinners, no report of heavy alcohol use.  Anemia with hemoglobin of 3 on admission, admitted to MICU for management of acute blood loss anemia due to GI bleeding.  Significant Hospital Events:  01/30/2023 admitted for acute blood loss anemia due to GI bleeding. 01/31/2023 plan for scope with GI.  Interim History / Subjective:  4 units PRBC. Hemodynamically stable. Generalized abdominal pain.  Objective   Blood pressure 107/74, pulse (!) 106, temperature 97.9 F (36.6 C), temperature source Oral, resp. rate (!) 28, weight 103.1 kg, SpO2 94%.       No intake or output data in the 24 hours ending 01/31/23 0720 Filed Weights   01/31/23 0649  Weight: 103.1 kg    Examination: No distress Heart rate tachycardic, rhythm is regular, radial pulses strong, no lower extremity edema Breathing is regular and unlabored on room air, lung sounds clear anteriorly Diffuse abdominal tenderness, nondistended Skin is warm and dry Alert to verbal, answers questions appropriately with lots of stimulation  Labs: Sodium 135 Potassium 3.8 Bicarb 17 BUN 72 Creatinine 2.37 WBC 6.2 Hemoglobin 7.1 Lactate 11.2 => 4.9 TSH 86.81  PT/INR 16.6/1.3  Imaging: CTA GI without evidence of large GI bleeding CXR with low lung volumes and cardiomegaly  Resolved Hospital Problem list   Resolved Problems:   * No resolved hospital problems. *   Assessment & Plan:  Principal Problem:   Upper GI bleed Active Problems:   Morbid obesity (HCC)   Diabetes mellitus with neuropathy (HCC)   Hypothyroidism   COPD mixed type (HCC)   Hyperlipidemia   Essential (primary) hypertension   CAD (coronary artery disease)    UTI (urinary tract infection)   Lactic acidosis   CKD (chronic kidney disease) stage 3, GFR 30-59 ml/min (HCC)   AKI (acute kidney injury) (HCC)   Acute upper GI bleed  Acute blood loss anemia GI bleeding - Status post 4 units PRBC - Repeat PRBC transfusion - Transfuse 1 unit each of platelet, FFP, cryoprecipitate - Empiric PPI BID IV - DC octreotide - TXA - Appreciate GI assistance, plan for EGD today, emergent consent obtained from emergency contact Hilma Favors - Transfuse for hemoglobin less than 8 g/dL  UTI - Ceftriaxone  AKI on CKD - Concern for ATN due to hypovolemia and profound anemia - Strict I's/O - Trend BMP  Hypothyroid - Markedly elevated TSH and low T4 - Synthroid 112 mcg via IV - Stress dose hydrocortisone 100 mg q12h  Urinary retention - Foley placed  Diabetes - SSI - Home Farxiga on hold  Coronary artery disease - Home Praluent, Imdur held - Higher transfusion threshold  COPD - Continue home umeclidinium-vilanterol  Acute metabolic encephalopathy - Secondary to blood loss and profound anemia  Chronic pain status post L4-5 decompression and fusion - Home MS Contin and oxycodone on hold - Home Linzess held - Watch for withdrawal  History of mood disorder, bipolar 1, ADHD - On alprazolam, Adderall, doxepin, Rexulti, Trintellix, all currently held - Med reconciliation pending  Best practice (daily eval):  Diet/type: NPO DVT prophylaxis: SCD GI prophylaxis: PPI infusion for GIB Lines: N/A Foley:  N/A Code Status:  full code Last date of multidisciplinary goals of care discussion [  01/31/23]  Marrianne Mood MD 01/31/2023, 7:20 AM  Pager: [phone]

## 2023-02-01 ENCOUNTER — Inpatient Hospital Stay (HOSPITAL_COMMUNITY): Payer: Medicare HMO

## 2023-02-01 DIAGNOSIS — G9341 Metabolic encephalopathy: Secondary | ICD-10-CM | POA: Diagnosis not present

## 2023-02-01 DIAGNOSIS — N179 Acute kidney failure, unspecified: Secondary | ICD-10-CM | POA: Diagnosis not present

## 2023-02-01 DIAGNOSIS — D62 Acute posthemorrhagic anemia: Secondary | ICD-10-CM | POA: Diagnosis not present

## 2023-02-01 DIAGNOSIS — K922 Gastrointestinal hemorrhage, unspecified: Secondary | ICD-10-CM

## 2023-02-01 LAB — CBC
HCT: 24.3 % — ABNORMAL LOW (ref 36.0–46.0)
HCT: 21.7 % — ABNORMAL LOW (ref 36.0–46.0)
HCT: 23 % — ABNORMAL LOW (ref 36.0–46.0)
HCT: 20.1 % — ABNORMAL LOW (ref 36.0–46.0)
Hemoglobin: 8.2 g/dL — ABNORMAL LOW (ref 12.0–15.0)
Hemoglobin: 7.5 g/dL — ABNORMAL LOW (ref 12.0–15.0)
Hemoglobin: 7.8 g/dL — ABNORMAL LOW (ref 12.0–15.0)
Hemoglobin: 6.8 g/dL — CL (ref 12.0–15.0)
MCH: 29.5 pg (ref 26.0–34.0)
MCH: 29.8 pg (ref 26.0–34.0)
MCH: 30.1 pg (ref 26.0–34.0)
MCH: 29.6 pg (ref 26.0–34.0)
MCHC: 33.7 g/dL (ref 30.0–36.0)
MCHC: 34.6 g/dL (ref 30.0–36.0)
MCHC: 33.9 g/dL (ref 30.0–36.0)
MCHC: 33.8 g/dL (ref 30.0–36.0)
MCV: 87.4 fL (ref 80.0–100.0)
MCV: 86.1 fL (ref 80.0–100.0)
MCV: 88.8 fL (ref 80.0–100.0)
MCV: 87.4 fL (ref 80.0–100.0)
Platelets: 237 10*3/uL (ref 150–400)
Platelets: 225 10*3/uL (ref 150–400)
Platelets: 235 10*3/uL (ref 150–400)
Platelets: 206 10*3/uL (ref 150–400)
RBC: 2.78 MIL/uL — ABNORMAL LOW (ref 3.87–5.11)
RBC: 2.52 MIL/uL — ABNORMAL LOW (ref 3.87–5.11)
RBC: 2.59 MIL/uL — ABNORMAL LOW (ref 3.87–5.11)
RBC: 2.3 MIL/uL — ABNORMAL LOW (ref 3.87–5.11)
RDW: 15.6 % — ABNORMAL HIGH (ref 11.5–15.5)
RDW: 15.9 % — ABNORMAL HIGH (ref 11.5–15.5)
RDW: 16.3 % — ABNORMAL HIGH (ref 11.5–15.5)
RDW: 16.5 % — ABNORMAL HIGH (ref 11.5–15.5)
WBC: 11 10*3/uL — ABNORMAL HIGH (ref 4.0–10.5)
WBC: 11.1 10*3/uL — ABNORMAL HIGH (ref 4.0–10.5)
WBC: 11 10*3/uL — ABNORMAL HIGH (ref 4.0–10.5)
WBC: 10.7 10*3/uL — ABNORMAL HIGH (ref 4.0–10.5)
nRBC: 2.6 % — ABNORMAL HIGH (ref 0.0–0.2)
nRBC: 1.6 % — ABNORMAL HIGH (ref 0.0–0.2)
nRBC: 1.5 % — ABNORMAL HIGH (ref 0.0–0.2)
nRBC: 1.2 % — ABNORMAL HIGH (ref 0.0–0.2)

## 2023-02-01 LAB — BPAM CRYOPRECIPITATE
Blood Product Expiration Date: 202408202359
ISSUE DATE / TIME: 202408190855
Unit Type and Rh: 600

## 2023-02-01 LAB — BPAM FFP
Blood Product Expiration Date: 202408242359
ISSUE DATE / TIME: 202408191426
Unit Type and Rh: 8400

## 2023-02-01 LAB — URINE CULTURE: Culture: 70000 — AB

## 2023-02-01 LAB — GLUCOSE, CAPILLARY
Glucose-Capillary: 104 mg/dL — ABNORMAL HIGH (ref 70–99)
Glucose-Capillary: 106 mg/dL — ABNORMAL HIGH (ref 70–99)
Glucose-Capillary: 119 mg/dL — ABNORMAL HIGH (ref 70–99)
Glucose-Capillary: 122 mg/dL — ABNORMAL HIGH (ref 70–99)
Glucose-Capillary: 127 mg/dL — ABNORMAL HIGH (ref 70–99)
Glucose-Capillary: 134 mg/dL — ABNORMAL HIGH (ref 70–99)
Glucose-Capillary: 93 mg/dL (ref 70–99)

## 2023-02-01 LAB — PREPARE CRYOPRECIPITATE: Unit division: 0

## 2023-02-01 LAB — BPAM PLATELET PHERESIS
Blood Product Expiration Date: 202408212359
ISSUE DATE / TIME: 202408191134
Unit Type and Rh: 5100

## 2023-02-01 LAB — BASIC METABOLIC PANEL
Anion gap: 12 (ref 5–15)
BUN: 48 mg/dL — ABNORMAL HIGH (ref 8–23)
CO2: 21 mmol/L — ABNORMAL LOW (ref 22–32)
Calcium: 7.2 mg/dL — ABNORMAL LOW (ref 8.9–10.3)
Chloride: 106 mmol/L (ref 98–111)
Creatinine, Ser: 1.87 mg/dL — ABNORMAL HIGH (ref 0.44–1.00)
GFR, Estimated: 30 mL/min — ABNORMAL LOW (ref >60)
Glucose, Bld: 149 mg/dL — ABNORMAL HIGH (ref 70–99)
Potassium: 3.1 mmol/L — ABNORMAL LOW (ref 3.5–5.1)
Sodium: 139 mmol/L (ref 135–145)

## 2023-02-01 LAB — PREPARE FRESH FROZEN PLASMA: Unit division: 0

## 2023-02-01 LAB — PREPARE PLATELET PHERESIS: Unit division: 0

## 2023-02-01 LAB — PROTIME-INR
INR: 1.2 (ref 0.8–1.2)
Prothrombin Time: 14.9 s (ref 11.4–15.2)

## 2023-02-01 LAB — PREPARE RBC (CROSSMATCH)

## 2023-02-01 LAB — T3: T3, Total: <20 ng/dL — ABNORMAL LOW (ref 71–180)

## 2023-02-01 LAB — CK: Total CK: 414 U/L — ABNORMAL HIGH (ref 38–234)

## 2023-02-01 MED ORDER — SODIUM CHLORIDE 0.9% IV SOLUTION: Freq: Once | INTRAVENOUS | Status: AC

## 2023-02-01 MED ORDER — WHITE PETROLATUM EX OINT
TOPICAL_OINTMENT | CUTANEOUS | Status: DC | PRN
  Filled 2023-02-01: qty 28.35

## 2023-02-01 MED ORDER — LACTULOSE 10 GM/15ML PO SOLN: 10.0000 g | Freq: Three times a day (TID) | ORAL | Status: DC

## 2023-02-01 MED ORDER — ALPRAZOLAM 0.5 MG PO TABS: 1.0000 mg | ORAL_TABLET | Freq: Three times a day (TID) | ORAL | Status: DC | PRN

## 2023-02-01 MED ORDER — LEVOTHYROXINE SODIUM 75 MCG PO TABS
150.0000 ug | ORAL_TABLET | Freq: Every day | ORAL | Status: DC
  Administered 2023-02-01 – 2023-02-08 (×8): 150 ug via ORAL
  Filled 2023-02-01 (×8): qty 2

## 2023-02-01 MED ORDER — HYDROCORTISONE SOD SUC (PF) 100 MG IJ SOLR
100.0000 mg | Freq: Every day | INTRAMUSCULAR | Status: DC
  Administered 2023-02-02 – 2023-02-04 (×3): 100 mg via INTRAVENOUS
  Filled 2023-02-01 (×3): qty 2

## 2023-02-01 MED ORDER — PANTOPRAZOLE SODIUM 40 MG PO TBEC
40.0000 mg | DELAYED_RELEASE_TABLET | Freq: Two times a day (BID) | ORAL | Status: DC
  Administered 2023-02-01 – 2023-02-08 (×14): 40 mg via ORAL
  Filled 2023-02-01 (×14): qty 1

## 2023-02-01 MED ORDER — IOHEXOL 350 MG/ML SOLN
65.0000 mL | Freq: Once | INTRAVENOUS | Status: AC | PRN
  Administered 2023-02-01: 65 mL via INTRAVENOUS

## 2023-02-01 MED ORDER — POTASSIUM CHLORIDE 10 MEQ/100ML IV SOLN
10.0000 meq | INTRAVENOUS | Status: AC
  Administered 2023-02-01 (×6): 10 meq via INTRAVENOUS
  Filled 2023-02-01 (×6): qty 100

## 2023-02-01 NOTE — Progress Notes (Signed)
[facility] ADULT ICU REPLACEMENT PROTOCOL   The patient does apply for the [facility] Adult ICU Electrolyte Replacment Protocol based on the criteria listed below:   1.Exclusion criteria: TCTS, ECMO, Dialysis, and Myasthenia Gravis patients 2. Is GFR >/= 30 ml/min? Yes.    Patient's GFR today is 30 3. Is SCr </= 2? Yes.   Patient's SCr is 1.87 mg/dL 4. Did SCr increase >/= 0.5 in 24 hours? No. 5.Pt's weight >40kg  Yes.   6. Abnormal electrolyte(s): K+=3.1  7. Electrolytes replaced per protocol 8.  Call MD STAT for K+ </= 2.5, Phos </= 1, or Mag </= 1 Physician:  Delia Chimes, eMD  Faye Ramsay 02/01/2023 6:48 AM

## 2023-02-01 NOTE — Progress Notes (Signed)
? ?  Inpatient Rehab Admissions Coordinator : ? ?Per therapy recommendations, patient was screened for CIR candidacy by Barbara Boyette RN MSN.  At this time patient appears to be a potential candidate for CIR. I will place a rehab consult per protocol for full assessment. Please call me with any questions. ? ?Barbara Boyette RN MSN ?Admissions Coordinator ?[phone] ?  ?

## 2023-02-01 NOTE — Progress Notes (Addendum)
NAME:  Caroline Williams, MRN:  [id number], DOB:  10-24-1960, LOS: 1 ADMISSION DATE:  01/30/2023  History of Present Illness:  62 year old female with history of coronary artery disease, COPD, GERD, thyroid disease presents after several days of abdominal pain, vomiting and dark stool.  Not on blood thinners, no report of heavy alcohol use.  Anemia with hemoglobin of 3 on admission, admitted to MICU for management of acute blood loss anemia due to GI bleeding.  Significant Hospital Events:  01/30/2023 admitted for acute blood loss anemia due to GI bleeding. 01/31/2023 plan for scope with GI.  Interim History / Subjective:  No events overnight. No gross evidence of GI bleeding. Large watery bowel movement this morning, not obviously melanotic per RN.  Objective   Blood pressure 135/86, pulse (!) 106, temperature 98 F (36.7 C), temperature source Oral, resp. rate 16, weight 103.1 kg, SpO2 (!) 79%.        Intake/Output Summary (Last 24 hours) at 02/01/2023 1039 Last data filed at 02/01/2023 1000 Gross per 24 hour  Intake 1937.63 ml  Output 2300 ml  Net -362.37 ml   Filed Weights   01/31/23 0649 01/31/23 0938  Weight: 103.1 kg 103.1 kg    Examination: No distress Heart rate tachycardic, rhythm is regular, radial pulses strong, brisk lower extremity capillary refill, no lower extremity edema Breathing is regular and unlabored on room air, lung sounds clear anteriorly Diffuse abdominal tenderness, mildly distended from yesterday, tympanic to percussion Skin is warm and dry Alert and oriented, answers questions appropriately, follows simple instructions  Labs: Sodium 139 Potassium 3.1 Chloride 106 Bicarb 21 BUN 48 Creatinine 1.87 CK 414 WBC 11.1 Hemoglobin 7.5 Platelets 225 PT/INR 14.9/1.2  Resolved Hospital Problem list   Resolved Problems:   Lactic acidosis   Hemorrhagic shock (HCC)   Assessment & Plan:  Principal Problem:   Gastrointestinal hemorrhage with  melena Active Problems:   Morbid obesity (HCC)   Diabetes mellitus with neuropathy (HCC)   Hypothyroidism   COPD mixed type (HCC)   Hyperlipidemia   Essential (primary) hypertension   CAD (coronary artery disease)   UTI (urinary tract infection)   CKD (chronic kidney disease) stage 3, GFR 30-59 ml/min (HCC)   AKI (acute kidney injury) (HCC)   Acute metabolic encephalopathy   ABLA (acute blood loss anemia)   Hypokalemia  Acute blood loss anemia Gastrointestinal hemorrhage with melena - Status post TXA, 5 units PRBC, 1 unit each of platelets, FFP, cryoprecipitate - Status post EGD without obvious upper GI source - Hemodynamically stable, persistent tachycardia - DC IV PPI in favor of p.o. pantoprazole - Okay for clear liquid diet today - Repeat CBC today, continue every 6 hour monitoring for now - CTA GI bleed if more melena - Appreciate GI assistance  UTI - Ceftriaxone through 02/02/23  AKI on CKD - Concern for ATN due to hypovolemia and profound anemia - Creatinine, BUN, metabolic acidosis improved somewhat today - Push p.o. fluids - Strict I's/O - Trend BMP  Hypothyroid - Markedly elevated TSH and low T4 - Start home levothyroxine 150 mcg p.o. - Decrease stress dose hydrocortisone to 100 mg daily  Urinary retention - Maintain Foley for now - Mobilize  Diabetes - Sugar well-controlled - SSI - Home Farxiga on hold  Coronary artery disease - Home Praluent, Imdur held  COPD - Continue home umeclidinium-vilanterol  Acute metabolic encephalopathy - Secondary to blood loss and profound anemia - Avoid sedating medicines - Mobilize aggressively  Chronic pain status  post L4-5 decompression and fusion - Home MS Contin and oxycodone on hold - Home Linzess held - Watch for withdrawal  History of mood disorder, bipolar 1, ADHD - Adderall, doxepin, Rexulti, Trintellix, all currently held in setting of encephalopathy - Restarting home Xanax  Best practice (daily  eval):  Diet/type: NPO DVT prophylaxis: SCD GI prophylaxis: PPI BID via p.o. for GIB Lines: N/A Foley:  Yes, and it is still needed Code Status:  full code  Marrianne Mood MD 02/01/2023, 10:39 AM  Pager: [phone]

## 2023-02-01 NOTE — Evaluation (Addendum)
Physical Therapy Evaluation Patient Details Name: Caroline Williams MRN: [id number] DOB: January 15, 1961 Today's Date: 02/01/2023  History of Present Illness  Pt is a 62 y/o female presenting with melena x4days, abdominal pain, and nonbloody emesis. +acidosis, anemia, AKI. PMH includes: L4-5 PLIF 7/31, anxiety, arthritis, bipolar 1, CAD, COPD, depression, DM, HTN, hernia repair, R TKR.  Clinical Impression  Pt lethargic able to state name and place with no recall or statement of back precautions, no brace or family present. Pt incontinent of stool on arrival with pt unaware requiring total assist for pericare. Pt able to roll with assist and required +2 assist to transition to sitting and standing with pt limited by lethargy and impaired cognition. Pt with decreased cognition, strength, balance and function who will benefit from acute therapy to maximize mobility, safety and independence to decrease burden of care. Pt with significant change from recent PLIF and if able to progress with mobility patient will benefit from intensive inpatient follow up therapy, >3 hours/day   Unable to get consistent pleth reading with pt initially 100% on 4L and then no readings at all HR 116      If plan is discharge home, recommend the following: Assistance with cooking/housework;Assist for transportation;Help with stairs or ramp for entrance;A lot of help with walking and/or transfers;A lot of help with bathing/dressing/bathroom;Supervision due to cognitive status   Can travel by private vehicle        Equipment Recommendations None recommended by PT  Recommendations for Other Services       Functional Status Assessment Patient has had a recent decline in their functional status and demonstrates the ability to make significant improvements in function in a reasonable and predictable amount of time.     Precautions / Restrictions Precautions Precautions: Fall;Back Precaution Booklet Issued: No Precaution  Comments: reviewed verbally, pt unable to recall Required Braces or Orthoses: Spinal Brace Spinal Brace: Lumbar corset Restrictions Weight Bearing Restrictions: No Other Position/Activity Restrictions: brace not present and no family present      Mobility  Bed Mobility Overal bed mobility: Needs Assistance Bed Mobility: Rolling, Sidelying to Sit, Sit to Sidelying Rolling: Min assist Sidelying to sit: Mod assist, +2 for physical assistance, +2 for safety/equipment     Sit to sidelying: Mod assist, +2 for safety/equipment, +2 for physical assistance General bed mobility comments: maximal multimodal cues required for pt to participatem, physical assist to clear legs and trunk to sit and lift legs on return to supine    Transfers Overall transfer level: Needs assistance Equipment used: 2 person hand held assist Transfers: Sit to/from Stand Sit to Stand: Max assist, Mod assist, +2 safety/equipment, +2 physical assistance           General transfer comment: max A +2 for first stand, mod A +2 for second from EOB with cues at axilla.. Pt initially declining lateral stepping, eventually able to laterally step 2-3x with mod A +2 toward [facility]    Ambulation/Gait               General Gait Details: unable  Stairs            Wheelchair Mobility     Tilt Bed    Modified Rankin (Stroke Patients Only)       Balance Overall balance assessment: Needs assistance Sitting-balance support: No upper extremity supported, Feet supported Sitting balance-Leahy Scale: Fair     Standing balance support: Bilateral upper extremity supported Standing balance-Leahy Scale: Poor Standing balance comment: tolerated standing for ~  10 seconds                             Pertinent Vitals/Pain Pain Assessment Pain Assessment: Faces Faces Pain Scale: Hurts a little bit Pain Location: generalized with transfers & rear peri with hygiene Pain Descriptors / Indicators:  Discomfort, Operative site guarding Pain Intervention(s): Limited activity within patient's tolerance, Repositioned, Monitored during session    Home Living Family/patient expects to be discharged to:: Private residence Living Arrangements: Spouse/significant other Available Help at Discharge: Family;Available 24 hours/day Type of Home: House Home Access: Stairs to enter   [employer] of Steps: 3   Home Layout: One level Home Equipment: Cane - single point;Tub bench;Rolling Walker (2 wheels) Additional Comments: pt lethargic and unable to provide, taken from prior admission    Prior Function Prior Level of Function : Independent/Modified Independent             Mobility Comments: cane prior to PLIF, was using RW after back sx. pt unable to state ADLs Comments: per last admission: independent, light IADls; not driving. Unsure of mobility since back sx     Extremity/Trunk Assessment   Upper Extremity Assessment Upper Extremity Assessment: Defer to OT evaluation    Lower Extremity Assessment Lower Extremity Assessment: Generalized weakness    Cervical / Trunk Assessment Cervical / Trunk Assessment: Back Surgery  Communication   Communication Communication: No apparent difficulties Cueing Techniques: Verbal cues;Tactile cues  Cognition Arousal: Lethargic Behavior During Therapy: Flat affect Overall Cognitive Status: Difficult to assess                                 General Comments: Initally stating "yes/ uh-huh" to all questions. lethargic throughout, not following many commands, holding eyes closed. Stated her name, "Bowie" and able to select correct day of week after re-orientation. Unable to recall any back precautions despite cues.        General Comments General comments (skin integrity, edema, etc.): VSS, unable to get accurate pleth    Exercises     Assessment/Plan    PT Assessment Patient needs continued PT services  PT  Problem List Decreased strength;Decreased activity tolerance;Decreased balance;Decreased mobility;Decreased knowledge of use of DME;Decreased safety awareness;Decreased knowledge of precautions;Pain       PT Treatment Interventions DME instruction;Gait training;Stair training;Functional mobility training;Therapeutic activities;Therapeutic exercise;Patient/family education    PT Goals (Current goals can be found in the Care Plan section)  Acute Rehab PT Goals PT Goal Formulation: Patient unable to participate in goal setting Time For Goal Achievement: 02/15/23 Potential to Achieve Goals: Fair    Frequency Min 1X/week     Co-evaluation PT/OT/SLP Co-Evaluation/Treatment: Yes Reason for Co-Treatment: Complexity of the patient's impairments (multi-system involvement);For patient/therapist safety PT goals addressed during session: Mobility/safety with mobility         AM-PAC PT "6 Clicks" Mobility  Outcome Measure Help needed turning from your back to your side while in a flat bed without using bedrails?: A Little Help needed moving from lying on your back to sitting on the side of a flat bed without using bedrails?: A Lot Help needed moving to and from a bed to a chair (including a wheelchair)?: Total Help needed standing up from a chair using your arms (e.g., wheelchair or bedside chair)?: A Lot Help needed to walk in hospital room?: Total Help needed climbing 3-5 steps with a railing? :  Total 6 Click Score: 10    End of Session   Activity Tolerance: Patient limited by lethargy Patient left: in bed;with call bell/phone within reach;with bed alarm set Nurse Communication: Mobility status PT Visit Diagnosis: Unsteadiness on feet (R26.81);Other abnormalities of gait and mobility (R26.89)    Time: 4034-7425 PT Time Calculation (min) (ACUTE ONLY): 16 min   Charges:   PT Evaluation $PT Eval Moderate Complexity: 1 Mod   PT General Charges $$ ACUTE PT VISIT: 1 Visit          Merryl Hacker, PT Acute Rehabilitation Services Office: [phone]   Cristine Polio 02/01/2023, 12:33 PM

## 2023-02-01 NOTE — Evaluation (Signed)
Occupational Therapy Evaluation Patient Details Name: Caroline Williams MRN: [id number] DOB: May 17, 1961 Today's Date: 02/01/2023   History of Present Illness Pt is a 62 y/o female presenting with melena x4days, abdominal pain, and nonbloody emesis. +acidosis, anemia, AKI. PMH includes: L4-5 PLIF 7/31, anxiety, arthritis, bipolar 1, CAD, COPD, depression, DM, HTN, hernia repair, R TKR.   Clinical Impression   Caroline Williams was evaluated s/p the above admission list. Prior to recent back sx she was indep, unsure of assist level for ADLs and mobility since recent discharge. Upon evaluation the pt was limited by lethargy, low LOA, impaired cognition, generalized weakness, back precautions, unsteady balance and poor activity tolerance. Overall she required min A for rolling and mod A to com to sitting EOB. Initially pt was refusing to stand however she stood and took short lateral steps with mod-max A +2. Due to the deficits listed below the pt also needs up to total A for LB ADLs and mod A for UB ADLs. Anticipate pt with progress well acutely with frequent therapy. Pt will benefit from continued acute OT services and intensive inpatient follow up therapy, >3 hours/day after discharge.         If plan is discharge home, recommend the following: A lot of help with walking and/or transfers;A lot of help with bathing/dressing/bathroom;Assistance with cooking/housework;Direct supervision/assist for medications management;Direct supervision/assist for financial management;Assist for transportation;Help with stairs or ramp for entrance    Functional Status Assessment  Patient has had a recent decline in their functional status and demonstrates the ability to make significant improvements in function in a reasonable and predictable amount of time.  Equipment Recommendations  None recommended by OT    Recommendations for Other Services Rehab consult     Precautions / Restrictions Precautions Precautions:  Fall;Back Precaution Booklet Issued: No Precaution Comments: reviewed verbally, pt unable to recall Required Braces or Orthoses: Spinal Brace Spinal Brace: Lumbar corset (not present acutely) Restrictions Weight Bearing Restrictions: No      Mobility Bed Mobility Overal bed mobility: Needs Assistance Bed Mobility: Rolling, Sidelying to Sit, Sit to Sidelying Rolling: Min assist Sidelying to sit: Mod assist, +2 for physical assistance, +2 for safety/equipment     Sit to sidelying: Mod assist, +2 for safety/equipment, +2 for physical assistance General bed mobility comments: maximal multimodal cues required for pt to participate    Transfers Overall transfer level: Needs assistance Equipment used: 2 person hand held assist Transfers: Sit to/from Stand, Bed to chair/wheelchair/BSC Sit to Stand: Max assist, Mod assist, +2 safety/equipment, +2 physical assistance     Step pivot transfers: Mod assist, +2 physical assistance, +2 safety/equipment     General transfer comment: max A +2 for first stand, mod A +2 for second. Pt initially declining lateral stepping, eventually able to laterally step 2-3x with mod A +2      Balance Overall balance assessment: Needs assistance Sitting-balance support: No upper extremity supported, Feet supported Sitting balance-Leahy Scale: Fair     Standing balance support: Bilateral upper extremity supported Standing balance-Leahy Scale: Poor Standing balance comment: tolerated standing for ~10 seconds                           ADL either performed or assessed with clinical judgement   ADL Overall ADL's : Needs assistance/impaired Eating/Feeding: Moderate assistance;Sitting   Grooming: Moderate assistance;Sitting   Upper Body Bathing: Maximal assistance;Sitting   Lower Body Bathing: Total assistance;Sit to/from stand   Upper Body Dressing :  Moderate assistance;Sitting   Lower Body Dressing: Total assistance   Toilet Transfer:  Maximal assistance;+2 for physical assistance;+2 for safety/equipment;Stand-pivot;BSC/3in1   Toileting- Clothing Manipulation and Hygiene: Total assistance;+2 for physical assistance;+2 for safety/equipment;Bed level       Functional mobility during ADLs: Maximal assistance General ADL Comments: limited by lethargy this date     Vision Baseline Vision/History: 0 No visual deficits Vision Assessment?: No apparent visual deficits (anticipate WFL, pt holding eyes closed majority of the session)     Perception Perception: Not tested       Praxis Praxis: Not tested       Pertinent Vitals/Pain Pain Assessment Pain Assessment: Faces Faces Pain Scale: Hurts a little bit Pain Location: generalized with transfers & rear peri with hygiene Pain Descriptors / Indicators: Discomfort, Operative site guarding Pain Intervention(s): Limited activity within patient's tolerance, Monitored during session     Extremity/Trunk Assessment Upper Extremity Assessment Upper Extremity Assessment: Generalized weakness;Difficult to assess due to impaired cognition   Lower Extremity Assessment Lower Extremity Assessment: Defer to PT evaluation   Cervical / Trunk Assessment Cervical / Trunk Assessment: Back Surgery   Communication Communication Communication: No apparent difficulties Cueing Techniques: Verbal cues;Tactile cues   Cognition Arousal: Lethargic Behavior During Therapy: Flat affect Overall Cognitive Status: Difficult to assess       General Comments: Initally stating "yes/ uh-huh" to all questions. lethargic throughout, not following many commands, holding eyes closed. Stated her name, "[name]" and able to select correct day of week after re-orientation. Unable to recall any back precautions despite cues.     General Comments  VSS, unable to get accurate pleth            Home Living Family/patient expects to be discharged to:: Private residence Living Arrangements:  Spouse/significant other Available Help at Discharge: Family;Available 24 hours/day Type of Home: House Home Access: Stairs to enter [employer] of Steps: 3   Home Layout: One level     Bathroom Shower/Tub: Chief Strategy Officer: Standard     Home Equipment: Cane - single point;Tub bench          Prior Functioning/Environment Prior Level of Function : Independent/Modified Independent             Mobility Comments: per last admission: uses a cane. Unsure of mobility since back sx ADLs Comments: per last admission: independent, light IADls; not driving. Unsure of mobility since back sx        OT Problem List: Decreased strength;Decreased range of motion;Decreased activity tolerance;Impaired balance (sitting and/or standing);Decreased safety awareness;Decreased cognition;Decreased knowledge of use of DME or AE;Decreased knowledge of precautions      OT Treatment/Interventions: Self-care/ADL training;Neuromuscular education;DME and/or AE instruction;Therapeutic activities;Patient/family education;Balance training    OT Goals(Current goals can be found in the care plan section) Acute Rehab OT Goals Patient Stated Goal: to sleep OT Goal Formulation: With patient Time For Goal Achievement: 02/15/23 Potential to Achieve Goals: Good ADL Goals Pt Will Perform Grooming: with contact guard assist;standing Pt Will Perform Upper Body Dressing: with set-up;sitting Pt Will Perform Lower Body Dressing: with min assist;sit to/from stand Pt Will Transfer to Toilet: with contact guard assist;ambulating;regular height toilet Additional ADL Goal #1: pt will indep recall 3/3 back precuations  OT Frequency: Min 1X/week       AM-PAC OT "6 Clicks" Daily Activity     Outcome Measure Help from another person eating meals?: A Lot Help from another person taking care of personal grooming?: A Lot  Help from another person toileting, which includes using toliet, bedpan,  or urinal?: Total Help from another person bathing (including washing, rinsing, drying)?: A Lot Help from another person to put on and taking off regular upper body clothing?: A Lot Help from another person to put on and taking off regular lower body clothing?: Total 6 Click Score: 10   End of Session Nurse Communication: Mobility status (O2 sensor not working)  Activity Tolerance: Patient tolerated treatment well Patient left: in bed;with call bell/phone within reach;with bed alarm set  OT Visit Diagnosis: Other abnormalities of gait and mobility (R26.89);Pain                Time: 8295-6213 OT Time Calculation (min): 21 min Charges:  OT General Charges $OT Visit: 1 Visit OT Evaluation $OT Eval Moderate Complexity: 1 Mod  Derenda Mis, OTR/L Acute Rehabilitation Services Office [phone] Secure Chat Communication Preferred   Donia Pounds 02/01/2023, 12:09 PM

## 2023-02-01 NOTE — Progress Notes (Signed)
Talked to the contact person in the [facility] because the patient is too drowsy to talk and informed regarding blood transfusion. He said because we have given the other units, its okay to continue. This call made due to Jehovah's witness status.

## 2023-02-01 NOTE — Progress Notes (Signed)
Follow up H/H stable today. Has not had dark melanic BM today, had one gray-ish stool, but not large volume melena like yesterday.   If evidence of large volume melena, significant drop in H/H, or hemodynamic instability would get CT GI bleed protocol scan.   Steffanie Dunn, DO 02/01/23 3:18 PM [facility]  For contact information, see Amion. If no response to pager, please call PCCM consult pager. After hours, 7PM- 7AM, please call Elink.

## 2023-02-01 NOTE — Progress Notes (Signed)
Subjective: No overt bleeding (no hematemesis; no further melena).  Objective: Vital signs in last 24 hours: Temp:  [98 F (36.7 C)-99.1 F (37.3 C)] 98.1 F (36.7 C) (08/20 1132) Pulse Rate:  [85-185] 185 (08/20 1300) Resp:  [12-21] 17 (08/20 1400) BP: (120-166)/(75-117) 120/82 (08/20 1400) SpO2:  [79 %-100 %] 87 % (08/20 1300) Weight change: 0 kg Last BM Date : 02/01/23  PE: GEN:  Somnolent but arousable, unable to answer questions ABD:  Protuberant, soft  Lab Results: CBC    Component Value Date/Time   WBC 11.0 (H) 02/01/2023 1200   RBC 2.59 (L) 02/01/2023 1200   HGB 7.8 (L) 02/01/2023 1200   HGB 13.1 05/21/2021 0846   HCT 23.0 (L) 02/01/2023 1200   HCT 40.9 05/21/2021 0846   PLT 235 02/01/2023 1200   PLT 503 (H) 05/21/2021 0846   MCV 88.8 02/01/2023 1200   MCV 83 05/21/2021 0846   MCH 30.1 02/01/2023 1200   MCHC 33.9 02/01/2023 1200   RDW 16.3 (H) 02/01/2023 1200   RDW 13.3 05/21/2021 0846   LYMPHSABS 1.3 01/30/2023 1954   LYMPHSABS 1.2 05/21/2021 0846   MONOABS 1.0 01/30/2023 1954   EOSABS 0.0 01/30/2023 1954   EOSABS 0.1 05/21/2021 0846   BASOSABS 0.0 01/30/2023 1954   BASOSABS 0.0 05/21/2021 0846   Assessment:   Melena, resolved.  Negative endoscopy. Acute blood loss anemia, improved after volume resuscitation. Altered mental status.  Anoxic brain injury in light of profound anemia?  Other cause?  Plan:   PPI. Follow CBC, transfuse if needed. If recurrent bleeding, repeat CT angiogram abd/pelvis. [facility] will follow at a distance; please call back as needed; upon time for discharge, please let us know and we can arrange outpatient follow-up with [place]; thank you for the consultation.   Freddy Jaksch 02/01/2023, 2:36 PM   Cell [phone] If no answer or after 5 PM call [phone]

## 2023-02-01 NOTE — Progress Notes (Signed)
Drop in H/H to 6.8- 1 unit pRBC, GI bleed CTA ordered.  Steffanie Dunn, DO 02/01/23 6:50 PM [facility]  For contact information, see Amion. If no response to pager, please call PCCM consult pager. After hours, 7PM- 7AM, please call Elink.

## 2023-02-02 ENCOUNTER — Inpatient Hospital Stay (HOSPITAL_COMMUNITY): Payer: Medicare HMO

## 2023-02-02 ENCOUNTER — Encounter (HOSPITAL_COMMUNITY): Payer: Self-pay | Admitting: Gastroenterology

## 2023-02-02 DIAGNOSIS — K921 Melena: Secondary | ICD-10-CM | POA: Diagnosis not present

## 2023-02-02 LAB — TYPE AND SCREEN
ABO/RH(D): AB POS
Antibody Screen: NEGATIVE
Unit division: 0
Unit division: 0
Unit division: 0
Unit division: 0
Unit division: 0
Unit division: 0

## 2023-02-02 LAB — BASIC METABOLIC PANEL
Anion gap: 8 (ref 5–15)
BUN: 39 mg/dL — ABNORMAL HIGH (ref 8–23)
CO2: 19 mmol/L — ABNORMAL LOW (ref 22–32)
Calcium: 7.1 mg/dL — ABNORMAL LOW (ref 8.9–10.3)
Chloride: 107 mmol/L (ref 98–111)
Creatinine, Ser: 1.44 mg/dL — ABNORMAL HIGH (ref 0.44–1.00)
GFR, Estimated: 41 mL/min — ABNORMAL LOW (ref >60)
Glucose, Bld: 113 mg/dL — ABNORMAL HIGH (ref 70–99)
Potassium: 3.4 mmol/L — ABNORMAL LOW (ref 3.5–5.1)
Sodium: 134 mmol/L — ABNORMAL LOW (ref 135–145)

## 2023-02-02 LAB — CBC
HCT: 20.8 % — ABNORMAL LOW (ref 36.0–46.0)
HCT: 21.9 % — ABNORMAL LOW (ref 36.0–46.0)
HCT: 24.1 % — ABNORMAL LOW (ref 36.0–46.0)
Hemoglobin: 7 g/dL — ABNORMAL LOW (ref 12.0–15.0)
Hemoglobin: 7.2 g/dL — ABNORMAL LOW (ref 12.0–15.0)
Hemoglobin: 8.1 g/dL — ABNORMAL LOW (ref 12.0–15.0)
MCH: 29.7 pg (ref 26.0–34.0)
MCH: 29.1 pg (ref 26.0–34.0)
MCH: 30.2 pg (ref 26.0–34.0)
MCHC: 33.7 g/dL (ref 30.0–36.0)
MCHC: 32.9 g/dL (ref 30.0–36.0)
MCHC: 33.6 g/dL (ref 30.0–36.0)
MCV: 88.1 fL (ref 80.0–100.0)
MCV: 88.7 fL (ref 80.0–100.0)
MCV: 89.9 fL (ref 80.0–100.0)
Platelets: 202 10*3/uL (ref 150–400)
Platelets: 191 10*3/uL (ref 150–400)
Platelets: 218 10*3/uL (ref 150–400)
RBC: 2.36 MIL/uL — ABNORMAL LOW (ref 3.87–5.11)
RBC: 2.47 MIL/uL — ABNORMAL LOW (ref 3.87–5.11)
RBC: 2.68 MIL/uL — ABNORMAL LOW (ref 3.87–5.11)
RDW: 16.1 % — ABNORMAL HIGH (ref 11.5–15.5)
RDW: 16.2 % — ABNORMAL HIGH (ref 11.5–15.5)
RDW: 16.6 % — ABNORMAL HIGH (ref 11.5–15.5)
WBC: 11.3 10*3/uL — ABNORMAL HIGH (ref 4.0–10.5)
WBC: 13 10*3/uL — ABNORMAL HIGH (ref 4.0–10.5)
WBC: 13.6 10*3/uL — ABNORMAL HIGH (ref 4.0–10.5)
nRBC: 1.2 % — ABNORMAL HIGH (ref 0.0–0.2)
nRBC: 1.2 % — ABNORMAL HIGH (ref 0.0–0.2)
nRBC: 1 % — ABNORMAL HIGH (ref 0.0–0.2)

## 2023-02-02 LAB — BPAM RBC
Blood Product Expiration Date: 202408272359
Blood Product Expiration Date: 202408272359
Blood Product Expiration Date: 202409032359
Blood Product Expiration Date: 202409032359
Blood Product Expiration Date: 202409042359
Blood Product Expiration Date: 202409062359
ISSUE DATE / TIME: 202408182024
ISSUE DATE / TIME: 202408182024
ISSUE DATE / TIME: 202408190219
ISSUE DATE / TIME: 202408190459
ISSUE DATE / TIME: 202408190758
ISSUE DATE / TIME: 202408202205
Unit Type and Rh: 5100
Unit Type and Rh: 5100
Unit Type and Rh: 6200
Unit Type and Rh: 6200
Unit Type and Rh: 6200
Unit Type and Rh: 6200

## 2023-02-02 LAB — GLUCOSE, CAPILLARY
Glucose-Capillary: 115 mg/dL — ABNORMAL HIGH (ref 70–99)
Glucose-Capillary: 118 mg/dL — ABNORMAL HIGH (ref 70–99)
Glucose-Capillary: 135 mg/dL — ABNORMAL HIGH (ref 70–99)
Glucose-Capillary: 75 mg/dL (ref 70–99)

## 2023-02-02 LAB — HEMOGLOBIN AND HEMATOCRIT, BLOOD
HCT: 21.1 % — ABNORMAL LOW (ref 36.0–46.0)
Hemoglobin: 7.1 g/dL — ABNORMAL LOW (ref 12.0–15.0)

## 2023-02-02 LAB — HEPATIC FUNCTION PANEL
ALT: 32 U/L (ref 0–44)
AST: 35 U/L (ref 15–41)
Albumin: 2.3 g/dL — ABNORMAL LOW (ref 3.5–5.0)
Alkaline Phosphatase: 58 U/L (ref 38–126)
Bilirubin, Direct: 0.1 mg/dL (ref 0.0–0.2)
Indirect Bilirubin: 0.4 mg/dL (ref 0.3–0.9)
Total Bilirubin: 0.5 mg/dL (ref 0.3–1.2)
Total Protein: 5.8 g/dL — ABNORMAL LOW (ref 6.5–8.1)

## 2023-02-02 LAB — PHOSPHORUS: Phosphorus: 1.4 mg/dL — ABNORMAL LOW (ref 2.5–4.6)

## 2023-02-02 LAB — MAGNESIUM: Magnesium: 2.2 mg/dL (ref 1.7–2.4)

## 2023-02-02 LAB — CK: Total CK: 246 U/L — ABNORMAL HIGH (ref 38–234)

## 2023-02-02 LAB — VITAMIN D 25 HYDROXY (VIT D DEFICIENCY, FRACTURES): Vit D, 25-Hydroxy: 14.24 ng/mL — ABNORMAL LOW (ref 30–100)

## 2023-02-02 MED ORDER — METRONIDAZOLE 500 MG/100ML IV SOLN
500.0000 mg | Freq: Two times a day (BID) | INTRAVENOUS | Status: DC
  Administered 2023-02-02 – 2023-02-04 (×6): 500 mg via INTRAVENOUS
  Filled 2023-02-02 (×6): qty 100

## 2023-02-02 MED ORDER — VITAMIN D (ERGOCALCIFEROL) 1.25 MG (50000 UNIT) PO CAPS
50000.0000 [IU] | ORAL_CAPSULE | ORAL | Status: DC
  Administered 2023-02-02: 50000 [IU] via ORAL
  Filled 2023-02-02: qty 1

## 2023-02-02 MED ORDER — SODIUM BICARBONATE 650 MG PO TABS
650.0000 mg | ORAL_TABLET | Freq: Three times a day (TID) | ORAL | Status: AC
  Administered 2023-02-02 – 2023-02-03 (×6): 650 mg via ORAL
  Filled 2023-02-02 (×6): qty 1

## 2023-02-02 MED ORDER — POTASSIUM PHOSPHATES 15 MMOLE/5ML IV SOLN
30.0000 mmol | Freq: Once | INTRAVENOUS | Status: AC
  Administered 2023-02-02: 30 mmol via INTRAVENOUS
  Filled 2023-02-02: qty 10

## 2023-02-02 NOTE — TOC Progression Note (Signed)
Transition of Care [facility]) - Progression Note    Patient Details  Name: Caroline Williams MRN: [id number] Date of Birth: 02/10/1961  [facility] CM/SW Contact  Janae Bridgeman, RN Phone Number: 02/02/2023, 11:35 AM  Clinical Narrative:    CM met with the patient at the bedside.  The patient admitted to the hospital for Gi bleed and transferred to 2 [place] from ICU.  The patient was resting and able to answer a few questions but I was unable to engage patient in history intake due to her sleepiness/poor mentation.  Patient is currently on room air.  The patient is being followed by CIR at this time for possible admission.   Expected Discharge Plan: IP Rehab Facility Barriers to Discharge: Continued Medical Work up  Expected Discharge Plan and Services   Discharge Planning Services: CM Consult Post Acute Care Choice: IP Rehab Living arrangements for the past 2 months: Single Family Home                                       Social Determinants of Health (SDOH) Interventions SDOH Screenings   Food Insecurity: Food Insecurity Present (11/03/2022)  Housing: Medium Risk (11/03/2022)  Transportation Needs: Unmet Transportation Needs (11/03/2022)  Utilities: At Risk (11/03/2022)  Alcohol Screen: Low Risk  (11/03/2022)  Depression (PHQ2-9): Low Risk  (11/03/2022)  Financial Resource Strain: Medium Risk (11/03/2022)  Physical Activity: Inactive (11/03/2022)  Social Connections: Moderately Integrated (11/03/2022)  Stress: Stress Concern Present (11/03/2022)  Tobacco Use: Medium Risk (01/12/2023)    Readmission Risk Interventions    02/02/2023   11:22 AM  Readmission Risk Prevention Plan  Transportation Screening Complete  PCP or Specialist Appt within 5-7 Days Complete  Home Care Screening Complete

## 2023-02-02 NOTE — Progress Notes (Signed)
[facility] Progress Note  Patient: Caroline Williams    MWU:[id number]  DOA: 01/30/2023     Date of Service: the patient was seen and examined on 02/02/2023  Chief Complaint  Patient presents with   Seizures   Altered Mental Status   Brief hospital course: 62 year old female with history of coronary artery disease, COPD, GERD, thyroid disease presents after several days of abdominal pain, vomiting and dark stool. Not on blood thinners, no report of heavy alcohol use. Anemia with hemoglobin of 3 on admission, admitted to MICU for management of acute blood loss anemia due to GI bleeding.   01/30/2023 admitted for acute blood loss anemia due to GI bleeding. 01/31/2023 s/p EGD done by GI.  Assessment and Plan: Acute blood loss anemia Gastrointestinal hemorrhage with melena - Status post TXA, 5 units PRBC, 1 unit each of platelets, FFP, cryoprecipitate - Status post EGD without obvious upper GI source - Hemodynamically stable, persistent tachycardia - DC IV PPI in favor of p.o. pantoprazole - Okay for clear liquid diet today - Repeat CBC today, continue every 6 hour monitoring for now - CTA GI bleed if more melena - Appreciate GI assistance 8/21 Hb 7.2-- 8.1 s/p PRBC transfusion given early morning today    Hypophosphatemia, Phos repleted. Monitor electrolytes.  UTI - Ceftriaxone through 02/02/23   AKI on CKD - Concern for ATN due to hypovolemia and profound anemia - Creatinine, BUN, metabolic acidosis improved somewhat today - Push p.o. fluids - Strict I's/O - Trend BMP   Hypothyroid - Markedly elevated TSH and low T4 - Start home levothyroxine 150 mcg p.o. - Decrease stress dose hydrocortisone to 100 mg daily   Urinary retention - Maintain Foley for now - Mobilize   Diabetes - Sugar well-controlled - SSI - Home Farxiga on hold   Coronary artery disease - Home Praluent, Imdur held   COPD - Continue home umeclidinium-vilanterol   Acute metabolic  encephalopathy - Secondary to blood loss and profound anemia - Avoid sedating medicines - Mobilize aggressively   Chronic pain status post L4-5 decompression and fusion - Home MS Contin and oxycodone on hold - Home Linzess held - Watch for withdrawal   History of mood disorder, bipolar 1, ADHD - Adderall, doxepin, Rexulti, Trintellix, all currently held in setting of encephalopathy - Restarting home Xanax  Vitamin D deficiency: started vitamin D 50,000 units p.o. weekly, follow with PCP to repeat vitamin D level after 3 to 6 months.   Body mass index is 37.82 kg/m.  Nutrition Problem: Inadequate oral intake Etiology: lethargy/confusion Interventions: Interventions: Refer to RD note for recommendations  Diet: Clear liquid diet DVT Prophylaxis: SCD, pharmacological prophylaxis contraindicated due to GI bleeding    Advance goals of care discussion: Full code  Family Communication: family was not present at bedside, at the time of interview.  The pt provided permission to discuss medical plan with the family. Opportunity was given to ask question and all questions were answered satisfactorily.   Disposition: Pt is from Home, admitted with hemorrhagic shock, GI bleed under ICU care, downgraded under [facility] service on 02/02/2023, still has low Hb, which precludes a safe discharge. Discharge to acute rehab, when stable.  Subjective: No significant events overnight, early morning CBC did show low Hb, 1 unit PRBC was transfused.  Patient was sleepy, slightly confused, AO x 2.  Denies any significant abdominal pain 2/10 currently, passing gas and had BM.  Denied any chest pain or palpitation, no shortness of breath.  Patient  was feeling generalized tiredness, no any other active issues  Physical Exam: General: NAD, lying comfortably Appear in no distress, affect appropriate Eyes: PERRLA ENT: Oral Mucosa Clear, moist  Neck: no JVD,  Cardiovascular: S1 and S2 Present, no Murmur,   Respiratory: good respiratory effort, Bilateral Air entry equal and Decreased, no Crackles, no wheezes Abdomen: Bowel Sound present, Soft and no tenderness,  Skin: no rashes Extremities: no Pedal edema, no calf tenderness Neurologic: without any new focal findings Gait not checked due to patient safety concerns  Vitals:   02/02/23 0532 02/02/23 0800 02/02/23 0810 02/02/23 1513  BP: 120/82  120/71 124/76  Pulse: 98 97 95 96  Resp: 18  18 17   Temp: 98.9 F (37.2 C)  98 F (36.7 C) 98.9 F (37.2 C)  TempSrc: Oral  Oral Oral  SpO2: 98%  96% 100%  Weight:        Intake/Output Summary (Last 24 hours) at 02/02/2023 1514 Last data filed at 02/02/2023 1511 Gross per 24 hour  Intake 474.15 ml  Output 1750 ml  Net -1275.85 ml   Filed Weights   01/31/23 0649 01/31/23 0938  Weight: 103.1 kg 103.1 kg    Data Reviewed: I have personally reviewed and interpreted daily labs, tele strips, imagings as discussed above. I reviewed all nursing notes, pharmacy notes, vitals, pertinent old records I have discussed plan of care as described above with RN and patient/family.  CBC: Recent Labs  Lab 01/30/23 1954 01/30/23 2305 02/01/23 1200 02/01/23 1800 02/02/23 0045 02/02/23 0327 02/02/23 0557 02/02/23 1141  WBC 13.8*   < > 11.0* 10.7* 11.3*  --  13.0* 13.6*  NEUTROABS 11.4*  --   --   --   --   --   --   --   HGB 3.0*   < > 7.8* 6.8* 7.0* 7.1* 7.2* 8.1*  HCT 10.0*   < > 23.0* 20.1* 20.8* 21.1* 21.9* 24.1*  MCV 99.0   < > 88.8 87.4 88.1  --  88.7 89.9  PLT 350   < > 235 206 202  --  191 218   < > = values in this interval not displayed.   Basic Metabolic Panel: Recent Labs  Lab 01/31/23 0059 01/31/23 0406 01/31/23 1836 02/01/23 0525 02/02/23 0557 02/02/23 1141  NA 134* 135 139 139 134*  --   K 4.2 3.8 3.3* 3.1* 3.4*  --   CL 102 102 103 106 107  --   CO2 16* 17* 19* 21* 19*  --   GLUCOSE 125* 97 173* 149* 113*  --   BUN 67* 72* 57* 48* 39*  --   CREATININE 2.52* 2.37*  2.14* 1.87* 1.44*  --   CALCIUM 7.9* 7.5* 7.3* 7.2* 7.1*  --   MG 2.3 2.2  --   --   --  2.2  PHOS 6.2* 6.0*  --   --   --  1.4*    Studies: DG Abd 1 View  Result Date: 02/02/2023 CLINICAL DATA:  Abdominal pain, small-bowel obstruction. EXAM: ABDOMEN - 1 VIEW COMPARISON:  February 01, 2023. FINDINGS: No abnormal bowel dilatation. No radio-opaque calculi or other significant radiographic abnormality are seen. IMPRESSION: No abnormal bowel dilatation. Electronically Signed   By: Lupita Raider M.D.   On: 02/02/2023 11:28   CT ANGIO GI BLEED  Result Date: 02/01/2023 CLINICAL DATA:  Lower GI bleeding.  History of bariatric surgery. EXAM: CTA ABDOMEN AND PELVIS WITHOUT AND WITH CONTRAST TECHNIQUE: Multidetector CT  imaging of the abdomen and pelvis was performed using the standard protocol during bolus administration of intravenous contrast. Multiplanar reconstructed images and MIPs were obtained and reviewed to evaluate the vascular anatomy. RADIATION DOSE REDUCTION: This exam was performed according to the departmental dose-optimization program which includes automated exposure control, adjustment of the mA and/or kV according to patient size and/or use of iterative reconstruction technique. CONTRAST:  65mL OMNIPAQUE IOHEXOL 350 MG/ML SOLN COMPARISON:  CTA GI bleed study 01/30/2023 FINDINGS: VASCULAR Aorta: Normal caliber aorta without aneurysm, dissection, vasculitis or significant stenosis. Celiac: Patent without evidence of aneurysm, dissection, vasculitis or significant stenosis. SMA: Patent without evidence of aneurysm, dissection, vasculitis or significant stenosis. Renals: Both renal arteries are patent without evidence of aneurysm, dissection, vasculitis, fibromuscular dysplasia or significant stenosis. IMA: Patent without evidence of aneurysm, dissection, vasculitis or significant stenosis. Inflow: Patent without evidence of aneurysm, dissection, vasculitis or significant stenosis. Proximal Outflow:  Bilateral common femoral and visualized portions of the superficial and profunda femoral arteries are patent without evidence of aneurysm, dissection, vasculitis or significant stenosis. Veins: No obvious venous abnormality within the limitations of this arterial phase study. Review of the MIP images confirms the above findings. NON-VASCULAR Lower chest: There are trace bilateral pleural effusions. Hepatobiliary: Hyperdensity throughout the gallbladder is likely related to excreted contrast. Gallbladder sludge is not excluded. No biliary ductal dilatation. There is a 2.2 cm cyst in the liver. No new liver lesions are seen. Pancreas: Unremarkable. No pancreatic ductal dilatation or surrounding inflammatory changes. Spleen: Normal in size without focal abnormality. Adrenals/Urinary Tract: There is a low-density right adrenal nodule measuring to 2.3 cm compatible with adenoma. The left adrenal gland is within normal limits. There is a 16 mm cyst in the left kidney. There is no hydronephrosis or perinephric fluid. The bladder is decompressed by Foley catheter. Stomach/Bowel: Examination is limited to assess for colonic bleeding secondary to retained hyperdense contrast. No definite acute hemorrhage identified within the bowel. There is diffuse colonic wall thickening most significant from the level of the splenic flexure to the level the rectum with mild surrounding inflammation compatible with colitis. The appendix is not seen. Patient is status post gastric bypass surgery. The stomach is nondilated. Jejunal loops in the left abdomen are now/newly dilated measuring up to 4.6 cm definitive transition point is not visualized, but there are nondilated small bowel loops distally. Lymphatic: No enlarged lymph nodes. Reproductive: Status post hysterectomy. No adnexal masses. Other: No abdominal wall hernia or abnormality. No abdominopelvic ascites. Musculoskeletal: Posterior fusion changes are seen at L4-L5. Degenerative  changes affect the spine. IMPRESSION: VASCULAR 1. No evidence for aortic dissection or aneurysm. 2. No acute gastrointestinal hemorrhage identified. Evaluation of the colon is limited secondary to retained hyperdense material. NON-VASCULAR 1. Diffuse colonic wall thickening compatible with colitis. 2. Dilated jejunal loops in the left abdomen concerning for small bowel obstruction. No definitive transition point is visualized. 3. Trace bilateral pleural effusions. 4. Right adrenal adenoma. Electronically Signed   By: Darliss Cheney M.D.   On: 02/01/2023 21:49    Scheduled Meds:  Chlorhexidine Gluconate Cloth  6 each Topical Q0600   hydrocortisone sod succinate (SOLU-CORTEF) inj  100 mg Intravenous Daily   insulin aspart  0-9 Units Subcutaneous Q4H   levothyroxine  150 mcg Oral Q0600   nicotine  7 mg Transdermal Daily   pantoprazole  40 mg Oral BID   sodium bicarbonate  650 mg Oral TID   umeclidinium-vilanterol  1 puff Inhalation Daily   Continuous Infusions:  dextrose     metronidazole 100 mL/hr at 02/02/23 0532   PRN Meds: acetaminophen **OR** acetaminophen, albuterol, ALPRAZolam, dextrose, naLOXone (NARCAN)  injection, ondansetron **OR** ondansetron (ZOFRAN) IV, white petrolatum  Time spent: 55 minutes  Author: Gillis Santa. MD [facility] 02/02/2023 3:14 PM  To reach On-call, see care teams to locate the attending and reach out to them via [email or web address]. If 7PM-7AM, please contact night-coverage If you still have difficulty reaching the attending provider, please page the [facility] (Director on Call) for [facility] on amion for assistance.

## 2023-02-02 NOTE — Plan of Care (Signed)
  Problem: Education: Goal: Ability to verbalize activity precautions or restrictions will improve Outcome: Progressing Goal: Knowledge of the prescribed therapeutic regimen will improve Outcome: Progressing Goal: Understanding of discharge needs will improve Outcome: Progressing   Problem: Activity: Goal: Ability to avoid complications of mobility impairment will improve Outcome: Progressing Goal: Ability to tolerate increased activity will improve Outcome: Progressing Goal: Will remain free from falls Outcome: Progressing   Problem: Bowel/Gastric: Goal: Gastrointestinal status for postoperative course will improve Outcome: Progressing   Problem: Clinical Measurements: Goal: Ability to maintain clinical measurements within normal limits will improve Outcome: Progressing Goal: Postoperative complications will be avoided or minimized Outcome: Progressing Goal: Diagnostic test results will improve Outcome: Progressing   Problem: Pain Management: Goal: Pain level will decrease Outcome: Progressing   Problem: Skin Integrity: Goal: Will show signs of wound healing Outcome: Progressing   Problem: Health Behavior/Discharge Planning: Goal: Identification of resources available to assist in meeting health care needs will improve Outcome: Progressing   Problem: Bladder/Genitourinary: Goal: Urinary functional status for postoperative course will improve Outcome: Progressing   Problem: Education: Goal: Ability to describe self-care measures that may prevent or decrease complications (Diabetes Survival Skills Education) will improve Outcome: Progressing Goal: Individualized Educational Video(s) Outcome: Progressing   Problem: Coping: Goal: Ability to adjust to condition or change in health will improve Outcome: Progressing   Problem: Fluid Volume: Goal: Ability to maintain a balanced intake and output will improve Outcome: Progressing   Problem: Health Behavior/Discharge  Planning: Goal: Ability to identify and utilize available resources and services will improve Outcome: Progressing Goal: Ability to manage health-related needs will improve Outcome: Progressing   Problem: Metabolic: Goal: Ability to maintain appropriate glucose levels will improve Outcome: Progressing   Problem: Nutritional: Goal: Maintenance of adequate nutrition will improve Outcome: Progressing Goal: Progress toward achieving an optimal weight will improve Outcome: Progressing   Problem: Skin Integrity: Goal: Risk for impaired skin integrity will decrease Outcome: Progressing   Problem: Tissue Perfusion: Goal: Adequacy of tissue perfusion will improve Outcome: Progressing   Problem: Fluid Volume: Goal: Hemodynamic stability will improve Outcome: Progressing   Problem: Clinical Measurements: Goal: Diagnostic test results will improve Outcome: Progressing Goal: Signs and symptoms of infection will decrease Outcome: Progressing   Problem: Respiratory: Goal: Ability to maintain adequate ventilation will improve Outcome: Progressing   Problem: Education: Goal: Knowledge of General Education information will improve Description: Including pain rating scale, medication(s)/side effects and non-pharmacologic comfort measures Outcome: Progressing   Problem: Health Behavior/Discharge Planning: Goal: Ability to manage health-related needs will improve Outcome: Progressing   Problem: Clinical Measurements: Goal: Ability to maintain clinical measurements within normal limits will improve Outcome: Progressing Goal: Will remain free from infection Outcome: Progressing Goal: Diagnostic test results will improve Outcome: Progressing Goal: Respiratory complications will improve Outcome: Progressing Goal: Cardiovascular complication will be avoided Outcome: Progressing   Problem: Activity: Goal: Risk for activity intolerance will decrease Outcome: Progressing   Problem:  Nutrition: Goal: Adequate nutrition will be maintained Outcome: Progressing   Problem: Coping: Goal: Level of anxiety will decrease Outcome: Progressing   Problem: Elimination: Goal: Will not experience complications related to bowel motility Outcome: Progressing Goal: Will not experience complications related to urinary retention Outcome: Progressing   Problem: Pain Managment: Goal: General experience of comfort will improve Outcome: Progressing   Problem: Safety: Goal: Ability to remain free from injury will improve Outcome: Progressing   Problem: Skin Integrity: Goal: Risk for impaired skin integrity will decrease Outcome: Progressing

## 2023-02-02 NOTE — Progress Notes (Addendum)
Inpatient Rehab Admissions Coordinator:    I met with Pt. To discuss potential CIR admit. Pt. Is interested. States her boyfriend can provide 24/7 support at d/c. I will reach out to him to confirm. Will send case to insurance today and pursue for admit .   Addendum, Boyfriend Thereasa Distance confirmed 24/7 min-mod A at d/c  Megan Salon, MS, CCC-SLP Rehab Admissions Coordinator  [phone] (celll) [phone] (office)

## 2023-02-02 NOTE — Progress Notes (Signed)
eLink Physician-Brief Progress Note Patient Name: SERENITIE KAMEI DOB: 02-19-1961 MRN: [id number]   Date of Service  02/02/2023  HPI/Events of Note  CT abdomen / pelvis result reviewed. Patient received a unit of PRBC for hemoglobin of 6.8 gm / dl.   eICU Interventions  Patient is on Rocephin. Will add Flagyl.        Thomasene Lot Marlicia Sroka 02/02/2023, 2:20 AM

## 2023-02-02 NOTE — Consult Note (Signed)
Physical Medicine and Rehabilitation Consult Reason for Consult: Gastrointestinal hemorrhage with melena Referring Physician: Gillis Santa, MD   HPI: Caroline Williams is a 62 y.o. female who presented with 4 days of melena, abdominal pain, and nonbloody emesis. Her labs were positive for acidosis, anemia, and AKI. She is currently requiring hemoglobin transfusions and Mod Ax2 with bend mobility. Physical Medicine & Rehabilitation was consulted to assess candidacy for CIR.     ROS +Lethargy Past Medical History:  Diagnosis Date   Anxiety    Arthritis    Asthma    Bipolar 1 disorder (HCC)    Breast discharge    Breast lump    Breast pain    CAD (coronary artery disease) 12/03/2022    o Cath 2008: no CAD   o CCTA 10/05/19: CAC score 49, 88th percentile, nonobstructive CAD (LM 1-24 mid and dist)  o Myoview 12/31/20: EF 45, no ischemia or infarction, low risk    Chronic pain    on MS Contin and oxycodone   COPD (chronic obstructive pulmonary disease) (HCC)    Depression    Diabetes mellitus    diet and exercise controlled   Dysrhythmia    HR in 30s on metoprolol   Escherichia coli (E. coli) infection    Fever    GERD (gastroesophageal reflux disease)    History of chest pain    History of knee replacement, total    Hypertension    Hypothyroidism    N&V (nausea and vomiting)    Peripheral neuropathy    Right foot no sensation   Sciatica    Sleep apnea    does not use CPAP   Thyroid disease    Wears glasses    Past Surgical History:  Procedure Laterality Date   ABDOMINAL HYSTERECTOMY     partial   APPENDECTOMY     BIOPSY  03/14/2018   Procedure: BIOPSY;  Surgeon: Charlott Rakes, MD;  Location: [facility];  Service: Endoscopy;;   BREAST DUCTAL SYSTEM EXCISION Right 08/31/2017   Procedure: RIGHT BREAST CENTRAL DUCT EXCISION;  Surgeon: Harriette Bouillon, MD;  Location: [facility];  Service: General;  Laterality: Right;   BREAST EXCISIONAL BIOPSY  Right    BREAST LUMPECTOMY     right   CARDIAC CATHETERIZATION     no significant CAD, nl LV function by 11/08/06 cath   CESAREAN SECTION     x4   COLONOSCOPY WITH PROPOFOL N/A 03/14/2018   Procedure: COLONOSCOPY WITH PROPOFOL;  Surgeon: Charlott Rakes, MD;  Location: [facility];  Service: Endoscopy;  Laterality: N/A;   GASTRIC ROUX-EN-Y N/A 11/25/2014   Procedure: LAPAROSCOPIC ROUX-EN-Y GASTRIC BYPASS WITH UPPER ENDOSCOPY;  Surgeon: Luretha Murphy, MD;  Location: [facility];  Service: General;  Laterality: N/A;   HERNIA REPAIR     left inguinal   JOINT REPLACEMENT     KNEE SURGERY     right knee   MOUTH SURGERY     MYOMECTOMY ABDOMINAL APPROACH  1989   POLYPECTOMY  03/14/2018   Procedure: POLYPECTOMY;  Surgeon: Charlott Rakes, MD;  Location: [facility];  Service: Endoscopy;;   TOTAL KNEE REVISION  06/21/2012   Procedure: TOTAL KNEE REVISION;  Surgeon: Nadara Mustard, MD;  Location: [facility];  Service: Orthopedics;  Laterality: Right;  Revision Right Total Knee Arthroplasty   Family History  Problem Relation Age of Onset   Cancer Father        colon   Stroke Father  Heart disease Father    Diabetes Father    Hypertension Father    Depression Sister    Hypertension Mother    Breast cancer Neg Hx    Social History:  reports that she quit smoking about 4 years ago. Her smoking use included cigarettes. She started smoking about 47 years ago. She has a 11 pack-year smoking history. She has never used smokeless tobacco. She reports that she does not currently use alcohol. She reports that she does not use drugs. Allergies:  Allergies  Allergen Reactions   Aspirin Other (See Comments), Shortness Of Breath and Anaphylaxis    Pt states she has had Toradol several times without any reactions  Other Reaction(s): Other (See Comments), respiratory distress   Bee Venom Anaphylaxis   Sulfa Antibiotics Anaphylaxis   Evolocumab     Other Reaction(s): Unknown   Other Other (See  Comments)    No blood products.  Patient did  Request that only albumin or albumin-containing products may be administered   Medications Prior to Admission  Medication Sig Dispense Refill   dapagliflozin propanediol (FARXIGA) 5 MG TABS tablet Take 1 tablet (5 mg total) by mouth daily before breakfast. 90 tablet 1   solifenacin (VESICARE) 5 MG tablet Take 1 tablet (5 mg total) by mouth daily. 90 tablet 1   albuterol (VENTOLIN HFA) 108 (90 Base) MCG/ACT inhaler INHALE 2 PUFFS INTO THE LUNGS EVERY 6 HOURS AS NEEDED FOR WHEEZING OR SHORTNESS OF BREATH 20.1 g 1   Alirocumab (PRALUENT) 150 MG/ML SOAJ INJECT 1 PEN INTO SKIN EVERY 14 DAYS. 6 mL 3   ALPRAZolam (XANAX) 1 MG tablet Take 1 mg by mouth 3 (three) times daily as needed for anxiety.  0   amphetamine-dextroamphetamine (ADDERALL XR) 30 MG 24 hr capsule Take 30 mg by mouth daily.      amphetamine-dextroamphetamine (ADDERALL) 20 MG tablet Take 20 mg by mouth every evening.  0   Blood Glucose Monitoring Suppl (ACCU-CHEK GUIDE) w/Device KIT 1 kit by Does not apply route 3 (three) times daily. To check blood sugars 1 kit 0   cetirizine (ZYRTEC) 10 MG tablet Take 1 tablet (10 mg total) by mouth daily. 90 tablet 1   Cyanocobalamin (VITAMIN B 12 PO) Take 1 tablet by mouth daily. Unsure of dose     cyclobenzaprine (FLEXERIL) 10 MG tablet Take 1 tablet (10 mg total) by mouth 3 (three) times daily as needed for muscle spasms. 30 tablet 0   diclofenac Sodium (VOLTAREN) 1 % GEL Apply 2-4 g topically 2 (two) times daily as needed (wrist/knee pain). 100 g 1   docusate sodium (COLACE) 100 MG capsule Take 1 capsule (100 mg total) by mouth 2 (two) times daily. 30 capsule 0   doxepin (SINEQUAN) 75 MG capsule Take 75 mg by mouth at bedtime.  0   fluticasone (FLONASE) 50 MCG/ACT nasal spray SHAKE LIQUID AND USE TWO SPRAYS IN EACH NOSTRIL DAILY 16 g 0   glucose blood (ACCU-CHEK GUIDE) test strip Use as instructed 100 each 12   hydrocortisone-pramoxine (ANALPRAM HC)  2.5-1 % rectal cream Place 1 application rectally 3 (three) times daily. 30 g 2   isosorbide mononitrate (IMDUR) 30 MG 24 hr tablet Take 1 tablet (30 mg total) by mouth daily. 90 tablet 3   Lancets (ACCU-CHEK MULTICLIX) lancets Use as instructed 100 each 12   levothyroxine (SYNTHROID) 150 MCG tablet TAKE 1 TABLET(150 MCG) BY MOUTH DAILY BEFORE BREAKFAST 30 tablet 0   linaclotide (LINZESS) 290 MCG  CAPS capsule Take 290 mcg by mouth See admin instructions. Every other night     metoprolol succinate (TOPROL XL) 25 MG 24 hr tablet Take 0.5 tablets (12.5 mg total) by mouth at bedtime. (Patient not taking: Reported on 01/11/2023) 45 tablet 3   morphine (MS CONTIN) 15 MG 12 hr tablet Take 1 tablet (15 mg total) by mouth every 12 (twelve) hours. 60 tablet 0   nabumetone (RELAFEN) 750 MG tablet Take 750 mg by mouth daily.     naloxone (NARCAN) nasal spray 4 mg/0.1 mL Place 1 spray into the nose as needed (overdose).     nitroGLYCERIN (NITROSTAT) 0.4 MG SL tablet DISSOLVE 1 TABLET UNDER THE TONGUE EVERY 5 MINUTES AS NEEDED 25 tablet 3   Olmesartan-amLODIPine-HCTZ 40-5-25 MG TABS Take 1 tablet by mouth daily. 90 tablet 0   omeprazole (PRILOSEC) 40 MG capsule TAKE 1 CAPSULE(40 MG) BY MOUTH DAILY 90 capsule 1   oxyCODONE-acetaminophen (PERCOCET/ROXICET) 5-325 MG tablet Take 1-2 tablets by mouth every 4 (four) hours as needed for moderate pain. 30 tablet 0   promethazine (PHENERGAN) 25 MG tablet Take 1 tablet (25 mg total) by mouth every 8 (eight) hours as needed for nausea or vomiting. 20 tablet 0   REXULTI 2 MG TABS tablet Take 2 mg by mouth daily.     spironolactone (ALDACTONE) 25 MG tablet Take 0.5 tablets (12.5 mg total) by mouth daily. 45 tablet 0   umeclidinium-vilanterol (ANORO ELLIPTA) 62.5-25 MCG/INH AEPB Inhale 1 puff into the lungs daily. 1 each 0   vortioxetine HBr (TRINTELLIX) 20 MG TABS tablet Take 20 mg by mouth daily.      Home: Home Living Family/patient expects to be discharged to:: Private  residence Living Arrangements: Spouse/significant other Available Help at Discharge: Family, Available 24 hours/day Type of Home: House Home Access: Stairs to enter [employer] of Steps: 3 Home Layout: One level Bathroom Shower/Tub: Engineer, manufacturing systems: Standard Home Equipment: Cane - single point, Tub bench, Agricultural consultant (2 wheels) Additional Comments: pt lethargic and unable to provide, taken from prior admission  Functional History: Prior Function Prior Level of Function : Independent/Modified Independent Mobility Comments: cane prior to PLIF, was using RW after back sx. pt unable to state ADLs Comments: per last admission: independent, light IADls; not driving. Unsure of mobility since back sx Functional Status:  Mobility: Bed Mobility Overal bed mobility: Needs Assistance Bed Mobility: Rolling, Sidelying to Sit, Sit to Sidelying Rolling: Min assist Sidelying to sit: Mod assist, +2 for physical assistance, +2 for safety/equipment Sit to sidelying: Mod assist, +2 for safety/equipment, +2 for physical assistance General bed mobility comments: maximal multimodal cues required for pt to participatem, physical assist to clear legs and trunk to sit and lift legs on return to supine Transfers Overall transfer level: Needs assistance Equipment used: 2 person hand held assist Transfers: Sit to/from Stand Sit to Stand: Max assist, Mod assist, +2 safety/equipment, +2 physical assistance Bed to/from chair/wheelchair/BSC transfer type:: Step pivot Step pivot transfers: Mod assist, +2 physical assistance, +2 safety/equipment General transfer comment: max A +2 for first stand, mod A +2 for second from EOB with cues at axilla.. Pt initially declining lateral stepping, eventually able to laterally step 2-3x with mod A +2 toward [facility] Ambulation/Gait General Gait Details: unable    ADL: ADL Overall ADL's : Needs assistance/impaired Eating/Feeding: Moderate assistance,  Sitting Grooming: Moderate assistance, Sitting Upper Body Bathing: Maximal assistance, Sitting Lower Body Bathing: Total assistance, Sit to/from stand Upper Body Dressing :  Moderate assistance, Sitting Lower Body Dressing: Total assistance Toilet Transfer: Maximal assistance, +2 for physical assistance, +2 for safety/equipment, Stand-pivot, BSC/3in1 Toileting- Clothing Manipulation and Hygiene: Total assistance, +2 for physical assistance, +2 for safety/equipment, Bed level Functional mobility during ADLs: Maximal assistance General ADL Comments: limited by lethargy this date  Cognition: Cognition Overall Cognitive Status: Difficult to assess Orientation Level: Disoriented to time Cognition Arousal: Lethargic Behavior During Therapy: Flat affect Overall Cognitive Status: Difficult to assess General Comments: Initally stating "yes/ uh-huh" to all questions. lethargic throughout, not following many commands, holding eyes closed. Stated her name, "[place]" and able to select correct day of week after re-orientation. Unable to recall any back precautions despite cues.  Blood pressure 120/71, pulse 95, temperature 98 F (36.7 C), temperature source Oral, resp. rate 18, weight 103.1 kg, SpO2 96%. Physical Exam Gen: no distress, normal appearing HEENT: oral mucosa pink and moist, NCAT Cardio: Reg rate Chest: normal effort, normal rate of breathing Abd: soft, non-distended Ext: no edema Psych: pleasant, normal affect Skin: intact Neuro: Lethargic, limited ability to follow commands, disoriented  Results for orders placed or performed during the hospital encounter of 01/30/23 (from the past 24 hour(s))  Glucose, capillary     Status: Abnormal   Collection Time: 02/01/23 11:32 AM  Result Value Ref Range   Glucose-Capillary 122 (H) 70 - 99 mg/dL  CBC     Status: Abnormal   Collection Time: 02/01/23 12:00 PM  Result Value Ref Range   WBC 11.0 (H) 4.0 - 10.5 K/uL   RBC 2.59 (L) 3.87  - 5.11 MIL/uL   Hemoglobin 7.8 (L) 12.0 - 15.0 g/dL   HCT 96.2 (L) 95.2 - 84.1 %   MCV 88.8 80.0 - 100.0 fL   MCH 30.1 26.0 - 34.0 pg   MCHC 33.9 30.0 - 36.0 g/dL   RDW 32.4 (H) 40.1 - 02.7 %   Platelets 235 150 - 400 K/uL   nRBC 1.5 (H) 0.0 - 0.2 %  Glucose, capillary     Status: Abnormal   Collection Time: 02/01/23  3:16 PM  Result Value Ref Range   Glucose-Capillary 119 (H) 70 - 99 mg/dL  Glucose, capillary     Status: Abnormal   Collection Time: 02/01/23  5:30 PM  Result Value Ref Range   Glucose-Capillary 104 (H) 70 - 99 mg/dL   Comment 1 Document in Chart   CBC     Status: Abnormal   Collection Time: 02/01/23  6:00 PM  Result Value Ref Range   WBC 10.7 (H) 4.0 - 10.5 K/uL   RBC 2.30 (L) 3.87 - 5.11 MIL/uL   Hemoglobin 6.8 (LL) 12.0 - 15.0 g/dL   HCT 25.3 (L) 66.4 - 40.3 %   MCV 87.4 80.0 - 100.0 fL   MCH 29.6 26.0 - 34.0 pg   MCHC 33.8 30.0 - 36.0 g/dL   RDW 47.4 (H) 25.9 - 56.3 %   Platelets 206 150 - 400 K/uL   nRBC 1.2 (H) 0.0 - 0.2 %  Prepare RBC (crossmatch)     Status: None   Collection Time: 02/01/23  6:50 PM  Result Value Ref Range   Order Confirmation      ORDER PROCESSED BY BLOOD BANK Performed at [facility], [address] [place], [place] [address]   Glucose, capillary     Status: None   Collection Time: 02/01/23  8:24 PM  Result Value Ref Range   Glucose-Capillary 93 70 - 99 mg/dL  Glucose, capillary     Status: Abnormal   Collection Time: 02/02/23 12:29 AM  Result Value Ref Range   Glucose-Capillary 135 (H) 70 - 99 mg/dL  CBC     Status: Abnormal   Collection Time: 02/02/23 12:45 AM  Result Value Ref Range   WBC 11.3 (H) 4.0 - 10.5 K/uL   RBC 2.36 (L) 3.87 - 5.11 MIL/uL   Hemoglobin 7.0 (L) 12.0 - 15.0 g/dL   HCT 16.1 (L) 09.6 - 04.5 %   MCV 88.1 80.0 - 100.0 fL   MCH 29.7 26.0 - 34.0 pg   MCHC 33.7 30.0 - 36.0 g/dL   RDW 40.9 (H) 81.1 - 91.4 %   Platelets 202 150 - 400 K/uL   nRBC 1.2 (H) 0.0 - 0.2 %  Hemoglobin and hematocrit,  blood     Status: Abnormal   Collection Time: 02/02/23  3:27 AM  Result Value Ref Range   Hemoglobin 7.1 (L) 12.0 - 15.0 g/dL   HCT 78.2 (L) 95.6 - 21.3 %  Glucose, capillary     Status: Abnormal   Collection Time: 02/02/23  5:35 AM  Result Value Ref Range   Glucose-Capillary 115 (H) 70 - 99 mg/dL  Basic metabolic panel     Status: Abnormal   Collection Time: 02/02/23  5:57 AM  Result Value Ref Range   Sodium 134 (L) 135 - 145 mmol/L   Potassium 3.4 (L) 3.5 - 5.1 mmol/L   Chloride 107 98 - 111 mmol/L   CO2 19 (L) 22 - 32 mmol/L   Glucose, Bld 113 (H) 70 - 99 mg/dL   BUN 39 (H) 8 - 23 mg/dL   Creatinine, Ser 0.86 (H) 0.44 - 1.00 mg/dL   Calcium 7.1 (L) 8.9 - 10.3 mg/dL   GFR, Estimated 41 (L) >60 mL/min   Anion gap 8 5 - 15  CBC     Status: Abnormal   Collection Time: 02/02/23  5:57 AM  Result Value Ref Range   WBC 13.0 (H) 4.0 - 10.5 K/uL   RBC 2.47 (L) 3.87 - 5.11 MIL/uL   Hemoglobin 7.2 (L) 12.0 - 15.0 g/dL   HCT 57.8 (L) 46.9 - 62.9 %   MCV 88.7 80.0 - 100.0 fL   MCH 29.1 26.0 - 34.0 pg   MCHC 32.9 30.0 - 36.0 g/dL   RDW 52.8 (H) 41.3 - 24.4 %   Platelets 191 150 - 400 K/uL   nRBC 1.2 (H) 0.0 - 0.2 %   CT ANGIO GI BLEED  Result Date: 02/01/2023 CLINICAL DATA:  Lower GI bleeding.  History of bariatric surgery. EXAM: CTA ABDOMEN AND PELVIS WITHOUT AND WITH CONTRAST TECHNIQUE: Multidetector CT imaging of the abdomen and pelvis was performed using the standard protocol during bolus administration of intravenous contrast. Multiplanar reconstructed images and MIPs were obtained and reviewed to evaluate the vascular anatomy. RADIATION DOSE REDUCTION: This exam was performed according to the departmental dose-optimization program which includes automated exposure control, adjustment of the mA and/or kV according to patient size and/or use of iterative reconstruction technique. CONTRAST:  65mL OMNIPAQUE IOHEXOL 350 MG/ML SOLN COMPARISON:  CTA GI bleed study 01/30/2023 FINDINGS:  VASCULAR Aorta: Normal caliber aorta without aneurysm, dissection, vasculitis or significant stenosis. Celiac: Patent without evidence of aneurysm, dissection, vasculitis or significant stenosis. SMA: Patent without evidence of aneurysm, dissection, vasculitis or significant stenosis. Renals: Both renal arteries are patent without evidence of aneurysm, dissection, vasculitis, fibromuscular dysplasia or significant stenosis. IMA: Patent without evidence of  aneurysm, dissection, vasculitis or significant stenosis. Inflow: Patent without evidence of aneurysm, dissection, vasculitis or significant stenosis. Proximal Outflow: Bilateral common femoral and visualized portions of the superficial and profunda femoral arteries are patent without evidence of aneurysm, dissection, vasculitis or significant stenosis. Veins: No obvious venous abnormality within the limitations of this arterial phase study. Review of the MIP images confirms the above findings. NON-VASCULAR Lower chest: There are trace bilateral pleural effusions. Hepatobiliary: Hyperdensity throughout the gallbladder is likely related to excreted contrast. Gallbladder sludge is not excluded. No biliary ductal dilatation. There is a 2.2 cm cyst in the liver. No new liver lesions are seen. Pancreas: Unremarkable. No pancreatic ductal dilatation or surrounding inflammatory changes. Spleen: Normal in size without focal abnormality. Adrenals/Urinary Tract: There is a low-density right adrenal nodule measuring to 2.3 cm compatible with adenoma. The left adrenal gland is within normal limits. There is a 16 mm cyst in the left kidney. There is no hydronephrosis or perinephric fluid. The bladder is decompressed by Foley catheter. Stomach/Bowel: Examination is limited to assess for colonic bleeding secondary to retained hyperdense contrast. No definite acute hemorrhage identified within the bowel. There is diffuse colonic wall thickening most significant from the level of  the splenic flexure to the level the rectum with mild surrounding inflammation compatible with colitis. The appendix is not seen. Patient is status post gastric bypass surgery. The stomach is nondilated. Jejunal loops in the left abdomen are now/newly dilated measuring up to 4.6 cm definitive transition point is not visualized, but there are nondilated small bowel loops distally. Lymphatic: No enlarged lymph nodes. Reproductive: Status post hysterectomy. No adnexal masses. Other: No abdominal wall hernia or abnormality. No abdominopelvic ascites. Musculoskeletal: Posterior fusion changes are seen at L4-L5. Degenerative changes affect the spine. IMPRESSION: VASCULAR 1. No evidence for aortic dissection or aneurysm. 2. No acute gastrointestinal hemorrhage identified. Evaluation of the colon is limited secondary to retained hyperdense material. NON-VASCULAR 1. Diffuse colonic wall thickening compatible with colitis. 2. Dilated jejunal loops in the left abdomen concerning for small bowel obstruction. No definitive transition point is visualized. 3. Trace bilateral pleural effusions. 4. Right adrenal adenoma. Electronically Signed   By: Darliss Cheney M.D.   On: 02/01/2023 21:49    Assessment/Plan: Diagnosis: Debility Does the need for close, 24 hr/day medical supervision in concert with the patient's rehab needs make it unreasonable for this patient to be served in a less intensive setting? Yes Co-Morbidities requiring supervision/potential complications:  1) Melena  2) Anemia: continue to monitor hemoglobin with transfusions as needed 3) AKI: continue to monitor creatinine 4) Morbid obesity: provide dietary education 5) CAD Due to bladder management, bowel management, safety, skin/wound care, disease management, medication administration, pain management, and patient education, does the patient require 24 hr/day rehab nursing? Yes Does the patient require coordinated care of a physician, rehab nurse, therapy  disciplines of PT, OT, SLP to address physical and functional deficits in the context of the above medical diagnosis(es)? Yes Addressing deficits in the following areas: balance, endurance, locomotion, strength, transferring, bowel/bladder control, bathing, dressing, feeding, grooming, toileting, cognition, and psychosocial support Can the patient actively participate in an intensive therapy program of at least 3 hrs of therapy per day at least 5 days per week? Yes The potential for patient to make measurable gains while on inpatient rehab is excellent Anticipated functional outcomes upon discharge from inpatient rehab are min assist  with PT, supervision with OT, modified independent with SLP. Estimated rehab length of stay to reach  the above functional goals is: 10-14 days Anticipated discharge destination: Home Overall Rehab/Functional Prognosis: excellent  POST ACUTE RECOMMENDATIONS: This patient's condition is appropriate for continued rehabilitative care in the following setting: CIR Patient has agreed to participate in recommended program. Yes Note that insurance prior authorization may be required for reimbursement for recommended care.    I have personally performed a face to face diagnostic evaluation of this patient. Additionally, I have examined the patient's medical record including any pertinent labs and radiographic images. If the physician assistant has documented in this note, I have reviewed and edited or otherwise concur with the physician assistant's documentation.  Thanks,  Horton Chin, MD 02/02/2023

## 2023-02-03 DIAGNOSIS — E038 Other specified hypothyroidism: Secondary | ICD-10-CM

## 2023-02-03 DIAGNOSIS — I10 Essential (primary) hypertension: Secondary | ICD-10-CM

## 2023-02-03 DIAGNOSIS — K922 Gastrointestinal hemorrhage, unspecified: Secondary | ICD-10-CM | POA: Diagnosis not present

## 2023-02-03 DIAGNOSIS — K921 Melena: Secondary | ICD-10-CM | POA: Diagnosis not present

## 2023-02-03 DIAGNOSIS — E1140 Type 2 diabetes mellitus with diabetic neuropathy, unspecified: Secondary | ICD-10-CM

## 2023-02-03 DIAGNOSIS — D62 Acute posthemorrhagic anemia: Secondary | ICD-10-CM | POA: Diagnosis not present

## 2023-02-03 LAB — GLUCOSE, CAPILLARY
Glucose-Capillary: 100 mg/dL — ABNORMAL HIGH (ref 70–99)
Glucose-Capillary: 103 mg/dL — ABNORMAL HIGH (ref 70–99)
Glucose-Capillary: 108 mg/dL — ABNORMAL HIGH (ref 70–99)
Glucose-Capillary: 128 mg/dL — ABNORMAL HIGH (ref 70–99)
Glucose-Capillary: 134 mg/dL — ABNORMAL HIGH (ref 70–99)
Glucose-Capillary: 143 mg/dL — ABNORMAL HIGH (ref 70–99)

## 2023-02-03 LAB — BASIC METABOLIC PANEL
Anion gap: 10 (ref 5–15)
BUN: 25 mg/dL — ABNORMAL HIGH (ref 8–23)
CO2: 21 mmol/L — ABNORMAL LOW (ref 22–32)
Calcium: 7.4 mg/dL — ABNORMAL LOW (ref 8.9–10.3)
Chloride: 104 mmol/L (ref 98–111)
Creatinine, Ser: 1.13 mg/dL — ABNORMAL HIGH (ref 0.44–1.00)
GFR, Estimated: 55 mL/min — ABNORMAL LOW (ref >60)
Glucose, Bld: 100 mg/dL — ABNORMAL HIGH (ref 70–99)
Potassium: 3.1 mmol/L — ABNORMAL LOW (ref 3.5–5.1)
Sodium: 135 mmol/L (ref 135–145)

## 2023-02-03 LAB — HEMOGLOBIN AND HEMATOCRIT, BLOOD
HCT: 21.4 % — ABNORMAL LOW (ref 36.0–46.0)
HCT: 19.1 % — ABNORMAL LOW (ref 36.0–46.0)
Hemoglobin: 7.2 g/dL — ABNORMAL LOW (ref 12.0–15.0)
Hemoglobin: 6.6 g/dL — CL (ref 12.0–15.0)

## 2023-02-03 LAB — PREPARE RBC (CROSSMATCH)

## 2023-02-03 LAB — MAGNESIUM: Magnesium: 2.2 mg/dL (ref 1.7–2.4)

## 2023-02-03 LAB — PHOSPHORUS: Phosphorus: 2.2 mg/dL — ABNORMAL LOW (ref 2.5–4.6)

## 2023-02-03 MED ORDER — SODIUM CHLORIDE 0.9% IV SOLUTION: Freq: Once | INTRAVENOUS | Status: AC

## 2023-02-03 MED ORDER — OXYCODONE-ACETAMINOPHEN 5-325 MG PO TABS
1.0000 | ORAL_TABLET | Freq: Three times a day (TID) | ORAL | Status: DC | PRN
  Administered 2023-02-03 – 2023-02-06 (×7): 1 via ORAL
  Filled 2023-02-03 (×7): qty 1

## 2023-02-03 MED ORDER — POTASSIUM PHOSPHATES 15 MMOLE/5ML IV SOLN
20.0000 mmol | Freq: Once | INTRAVENOUS | Status: AC
  Administered 2023-02-03: 20 mmol via INTRAVENOUS
  Filled 2023-02-03: qty 6.67

## 2023-02-03 MED ORDER — BOOST / RESOURCE BREEZE PO LIQD CUSTOM
1.0000 | Freq: Three times a day (TID) | ORAL | Status: DC
  Administered 2023-02-03 – 2023-02-06 (×4): 1 via ORAL

## 2023-02-03 MED ORDER — ADULT MULTIVITAMIN W/MINERALS CH
1.0000 | ORAL_TABLET | Freq: Two times a day (BID) | ORAL | Status: DC
  Administered 2023-02-04 – 2023-02-08 (×9): 1 via ORAL
  Filled 2023-02-03 (×10): qty 1

## 2023-02-03 MED ORDER — ADULT MULTIVITAMIN W/MINERALS CH
1.0000 | ORAL_TABLET | Freq: Every day | ORAL | Status: DC
  Administered 2023-02-03: 1 via ORAL
  Filled 2023-02-03: qty 1

## 2023-02-03 NOTE — Discharge Summary (Deleted)
PROGRESS NOTE    Caroline Williams  VIF:[id number] DOB: 07/28/1960 DOA: 01/30/2023 PCP: Hoy Register, MD    Brief Narrative:   62 year old female with history of coronary artery disease, COPD, GERD, thyroid disease presented to the hospital after several days of abdominal pain, vomiting and dark stool. Not on blood thinners, no report of heavy alcohol use.  On initial presentation patient had hemoglobin of 3.  Patient was initially admitted to the intensive care unit for management of acute blood loss anemia secondary to GI bleed and underwent endoscopic evaluation on 01/31/2023 by GI.  Patient was subsequently considered stable for transfer out of the ICU.  Assessment and Plan:  Acute blood loss anemia Gastrointestinal hemorrhage with melena Patient received multiple units of transfusion including 5 units PRBC, 1 unit each of platelets, FFP, cryoprecipitate during hospitalization.  Underwent EGD without obvious upper GI source.  Hemodynamically was stable but had some tachycardia.  Continue Protonix. CTA GI bleed if more melena.  GI on board.  Will continue to monitor hemoglobin.  Transfuse for hemoglobin less than 7.  Latest hemoglobin of 7.2.    Hypophosphatemia, phosphorus of 1.4 on 02/02/2023.  Has been replenished.  Phosphorus level today at 2.2.  Will continue to replenish.  Hypokalemia.  Potassium of 3.1.  Will replace with potassium phosphate.  Check levels in AM.   UTI Completed course of Rocephin on 02/02/2023   AKI on CKD - Concern for ATN due to hypovolemia and profound anemia.  Latest creatinine of 1.1.  Improved.  Monitor BMP.   Hypothyroidism Markedly elevated TSH and low T4.  Continue Synthroid, hydrocortisone    Acute urinary retention - On Foley catheter.  Will consider discontinuation of Foley catheter likely in 1 to 2 days.  Diabetes mellitus Continue sliding scale insulin, Accu-Cheks diabetic diet.  Continue to hold Farxiga   Coronary artery disease Imdur on  hold.  No acute issues.   COPD Continue home umeclidinium-vilanterol.  Appears comfortable.   Acute metabolic encephalopathy - Secondary to blood loss and profound anemia.  Improved.  Avoid sedatives.   Chronic pain status post L4-5 decompression and fusion Patient is on  MS Contin and oxycodone at home..  Linzess on hold.   History of mood disorder, bipolar 1, ADHD On Adderall, doxepin, Rexulti, Trintellix at home., all currently held in setting of encephalopathy.  Continue Xanax.  Resume gradually.   Vitamin D deficiency: Continue vitamin D 50,000 units p.o. weekly, follow with PCP to repeat vitamin D level after 3 to 6 months.  Debility, deconditioning.  PT has recommended CIR at this time.     DVT prophylaxis: SCDs Start: 01/31/23 0307   Code Status:     Code Status: Full Code  Disposition: CIR as per PT recommendation.  Status is: Inpatient Remains inpatient appropriate because: Need for CIR, electrolyte imbalance   Family Communication: None at bedside  Consultants:  PCCM GI  Procedures:  EGD on 01/31/2023 Transfusion of PRBC and blood products  Antimicrobials:  Flagyl  Anti-infectives (From admission, onward)    Start     Dose/Rate Route Frequency Ordered Stop   02/02/23 0400  metroNIDAZOLE (FLAGYL) IVPB 500 mg        500 mg 100 mL/hr over 60 Minutes Intravenous Every 12 hours 02/02/23 0231 02/07/23 0359   01/31/23 0000  cefTRIAXone (ROCEPHIN) 1 g in sodium chloride 0.9 % 100 mL IVPB  Status:  Discontinued        1 g 200 mL/hr over 30 Minutes  Intravenous  Once 01/30/23 2352 01/30/23 2354   01/31/23 0000  cefTRIAXone (ROCEPHIN) 1 g in sodium chloride 0.9 % 100 mL IVPB        1 g 200 mL/hr over 30 Minutes Intravenous Every 24 hours 01/30/23 2354 02/02/23 0253        Subjective: Today, patient was seen and examined at bedside.  Patient stated that she could not sleep well.  Has had a bowel movement this morning but did not have any blood in the stool.   Has mild abdominal discomfort.  Denies any chest pain, shortness of breath.  Objective: Vitals:   02/03/23 0024 02/03/23 0500 02/03/23 0802 02/03/23 0829  BP:  131/87 (!) 141/82   Pulse: 90 93 96 98  Resp:  18  16  Temp:  98 F (36.7 C) 98.8 F (37.1 C)   TempSrc:  Oral    SpO2: 98% 100% 100% 98%  Weight:        Intake/Output Summary (Last 24 hours) at 02/03/2023 1047 Last data filed at 02/03/2023 0427 Gross per 24 hour  Intake 165.19 ml  Output 400 ml  Net -234.81 ml   Filed Weights   01/31/23 0649 01/31/23 0938  Weight: 103.1 kg 103.1 kg    Physical Examination: Body mass index is 37.82 kg/m.   General: Obese built, not in obvious distress HENT:   Mild pallor noted.  Oral mucosa is moist.  Chest:  .  Diminished breath sounds bilaterally. No crackles or wheezes.  CVS: S1 &S2 heard. No murmur.  Regular rate and rhythm. Abdomen: Soft, nontender, nondistended.  Bowel sounds are heard.   Extremities: No cyanosis, clubbing or edema.  Peripheral pulses are palpable. Psych: Alert, awake and oriented, normal mood CNS:  No cranial nerve deficits.  Power equal in all extremities.   Skin: Warm and dry.  No rashes noted.  Data Reviewed:   CBC: Recent Labs  Lab 01/30/23 1954 01/30/23 2305 02/01/23 1200 02/01/23 1800 02/02/23 0045 02/02/23 0327 02/02/23 0557 02/02/23 1141 02/03/23 0744  WBC 13.8*   < > 11.0* 10.7* 11.3*  --  13.0* 13.6*  --   NEUTROABS 11.4*  --   --   --   --   --   --   --   --   HGB 3.0*   < > 7.8* 6.8* 7.0* 7.1* 7.2* 8.1* 7.2*  HCT 10.0*   < > 23.0* 20.1* 20.8* 21.1* 21.9* 24.1* 21.4*  MCV 99.0   < > 88.8 87.4 88.1  --  88.7 89.9  --   PLT 350   < > 235 206 202  --  191 218  --    < > = values in this interval not displayed.    Basic Metabolic Panel: Recent Labs  Lab 01/31/23 0059 01/31/23 0406 01/31/23 1836 02/01/23 0525 02/02/23 0557 02/02/23 1141 02/03/23 0744  NA 134* 135 139 139 134*  --  135  K 4.2 3.8 3.3* 3.1* 3.4*  --  3.1*   CL 102 102 103 106 107  --  104  CO2 16* 17* 19* 21* 19*  --  21*  GLUCOSE 125* 97 173* 149* 113*  --  100*  BUN 67* 72* 57* 48* 39*  --  25*  CREATININE 2.52* 2.37* 2.14* 1.87* 1.44*  --  1.13*  CALCIUM 7.9* 7.5* 7.3* 7.2* 7.1*  --  7.4*  MG 2.3 2.2  --   --   --  2.2 2.2  PHOS 6.2* 6.0*  --   --   --  1.4* 2.2*    Liver Function Tests: Recent Labs  Lab 01/30/23 1933 01/31/23 0406 02/02/23 1141  AST 52* 63* 35  ALT 29 33 32  ALKPHOS 72 72 58  BILITOT 0.7 0.6 0.5  PROT 6.0* 5.6* 5.8*  ALBUMIN 2.9* 2.8* 2.3*     Radiology Studies: DG Abd 1 View  Result Date: 02/02/2023 CLINICAL DATA:  Abdominal pain, small-bowel obstruction. EXAM: ABDOMEN - 1 VIEW COMPARISON:  February 01, 2023. FINDINGS: No abnormal bowel dilatation. No radio-opaque calculi or other significant radiographic abnormality are seen. IMPRESSION: No abnormal bowel dilatation. Electronically Signed   By: Lupita Raider M.D.   On: 02/02/2023 11:28   CT ANGIO GI BLEED  Result Date: 02/01/2023 CLINICAL DATA:  Lower GI bleeding.  History of bariatric surgery. EXAM: CTA ABDOMEN AND PELVIS WITHOUT AND WITH CONTRAST TECHNIQUE: Multidetector CT imaging of the abdomen and pelvis was performed using the standard protocol during bolus administration of intravenous contrast. Multiplanar reconstructed images and MIPs were obtained and reviewed to evaluate the vascular anatomy. RADIATION DOSE REDUCTION: This exam was performed according to the departmental dose-optimization program which includes automated exposure control, adjustment of the mA and/or kV according to patient size and/or use of iterative reconstruction technique. CONTRAST:  65mL OMNIPAQUE IOHEXOL 350 MG/ML SOLN COMPARISON:  CTA GI bleed study 01/30/2023 FINDINGS: VASCULAR Aorta: Normal caliber aorta without aneurysm, dissection, vasculitis or significant stenosis. Celiac: Patent without evidence of aneurysm, dissection, vasculitis or significant stenosis. SMA: Patent  without evidence of aneurysm, dissection, vasculitis or significant stenosis. Renals: Both renal arteries are patent without evidence of aneurysm, dissection, vasculitis, fibromuscular dysplasia or significant stenosis. IMA: Patent without evidence of aneurysm, dissection, vasculitis or significant stenosis. Inflow: Patent without evidence of aneurysm, dissection, vasculitis or significant stenosis. Proximal Outflow: Bilateral common femoral and visualized portions of the superficial and profunda femoral arteries are patent without evidence of aneurysm, dissection, vasculitis or significant stenosis. Veins: No obvious venous abnormality within the limitations of this arterial phase study. Review of the MIP images confirms the above findings. NON-VASCULAR Lower chest: There are trace bilateral pleural effusions. Hepatobiliary: Hyperdensity throughout the gallbladder is likely related to excreted contrast. Gallbladder sludge is not excluded. No biliary ductal dilatation. There is a 2.2 cm cyst in the liver. No new liver lesions are seen. Pancreas: Unremarkable. No pancreatic ductal dilatation or surrounding inflammatory changes. Spleen: Normal in size without focal abnormality. Adrenals/Urinary Tract: There is a low-density right adrenal nodule measuring to 2.3 cm compatible with adenoma. The left adrenal gland is within normal limits. There is a 16 mm cyst in the left kidney. There is no hydronephrosis or perinephric fluid. The bladder is decompressed by Foley catheter. Stomach/Bowel: Examination is limited to assess for colonic bleeding secondary to retained hyperdense contrast. No definite acute hemorrhage identified within the bowel. There is diffuse colonic wall thickening most significant from the level of the splenic flexure to the level the rectum with mild surrounding inflammation compatible with colitis. The appendix is not seen. Patient is status post gastric bypass surgery. The stomach is nondilated.  Jejunal loops in the left abdomen are now/newly dilated measuring up to 4.6 cm definitive transition point is not visualized, but there are nondilated small bowel loops distally. Lymphatic: No enlarged lymph nodes. Reproductive: Status post hysterectomy. No adnexal masses. Other: No abdominal wall hernia or abnormality. No abdominopelvic ascites. Musculoskeletal: Posterior fusion changes are seen at L4-L5. Degenerative changes affect the spine. IMPRESSION: VASCULAR 1. No evidence for aortic dissection or aneurysm. 2.  No acute gastrointestinal hemorrhage identified. Evaluation of the colon is limited secondary to retained hyperdense material. NON-VASCULAR 1. Diffuse colonic wall thickening compatible with colitis. 2. Dilated jejunal loops in the left abdomen concerning for small bowel obstruction. No definitive transition point is visualized. 3. Trace bilateral pleural effusions. 4. Right adrenal adenoma. Electronically Signed   By: Darliss Cheney M.D.   On: 02/01/2023 21:49      LOS: 3 days    Joycelyn Das, MD [facility] Available via Epic secure chat 7am-7pm After these hours, please refer to coverage provider listed on [email or web address] 02/03/2023, 10:47 AM

## 2023-02-03 NOTE — Progress Notes (Signed)
PROGRESS NOTE    Caroline Williams  ZOX:[id number] DOB: 06-10-1961 DOA: 01/30/2023 PCP: Hoy Register, MD    Brief Narrative:   62 year old female with history of coronary artery disease, COPD, GERD, thyroid disease presented to the hospital after several days of abdominal pain, vomiting and dark stool. Not on blood thinners, no report of heavy alcohol use.  On initial presentation patient had hemoglobin of 3.  Patient was initially admitted to the intensive care unit for management of acute blood loss anemia secondary to GI bleed and underwent endoscopic evaluation on 01/31/2023 by GI.  Patient was subsequently considered stable for transfer out of the ICU.  Assessment and Plan:  Acute blood loss anemia Gastrointestinal hemorrhage with melena Patient received multiple units of transfusion including 5 units PRBC, 1 unit each of platelets, FFP, cryoprecipitate during hospitalization.  Underwent EGD without obvious upper GI source.  Hemodynamically was stable but had some tachycardia.  Continue Protonix. CTA GI bleed if more melena.  GI on board.  Will continue to monitor hemoglobin.  Transfuse for hemoglobin less than 7.  Latest hemoglobin of 7.2.    Hypophosphatemia, phosphorus of 1.4 on 02/02/2023.  Has been replenished.  Phosphorus level today at 2.2.  Will continue to replenish.  Hypokalemia.  Potassium of 3.1.  Will replace with potassium phosphate.  Check levels in AM.   UTI Completed course of Rocephin on 02/02/2023   AKI on CKD - Concern for ATN due to hypovolemia and profound anemia.  Latest creatinine of 1.1.  Improved.  Monitor BMP.   Hypothyroidism Markedly elevated TSH and low T4.  Continue Synthroid, hydrocortisone    Acute urinary retention - On Foley catheter.  Will consider discontinuation of Foley catheter likely in 1 to 2 days.  Diabetes mellitus Continue sliding scale insulin, Accu-Cheks diabetic diet.  Continue to hold Farxiga   Coronary artery disease Imdur on  hold.  No acute issues.   COPD Continue home umeclidinium-vilanterol.  Appears comfortable.   Acute metabolic encephalopathy - Secondary to blood loss and profound anemia.  Improved.  Avoid sedatives.   Chronic pain status post L4-5 decompression and fusion Patient is on  MS Contin and oxycodone at home..  Linzess on hold.   History of mood disorder, bipolar 1, ADHD On Adderall, doxepin, Rexulti, Trintellix at home., all currently held in setting of encephalopathy.  Continue Xanax.  Resume gradually.   Vitamin D deficiency: Continue vitamin D 50,000 units p.o. weekly, follow with PCP to repeat vitamin D level after 3 to 6 months.  Debility, deconditioning.  PT has recommended CIR at this time.     DVT prophylaxis: SCDs Start: 01/31/23 0307   Code Status:     Code Status: Full Code  Disposition: CIR as per PT recommendation.  Status is: Inpatient Remains inpatient appropriate because: Need for CIR, electrolyte imbalance   Family Communication: None at bedside  Consultants:  PCCM GI  Procedures:  EGD on 01/31/2023 Transfusion of PRBC and blood products  Antimicrobials:  Flagyl  Anti-infectives (From admission, onward)    Start     Dose/Rate Route Frequency Ordered Stop   02/02/23 0400  metroNIDAZOLE (FLAGYL) IVPB 500 mg        500 mg 100 mL/hr over 60 Minutes Intravenous Every 12 hours 02/02/23 0231 02/07/23 0359   01/31/23 0000  cefTRIAXone (ROCEPHIN) 1 g in sodium chloride 0.9 % 100 mL IVPB  Status:  Discontinued        1 g 200 mL/hr over 30 Minutes  Intravenous  Once 01/30/23 2352 01/30/23 2354   01/31/23 0000  cefTRIAXone (ROCEPHIN) 1 g in sodium chloride 0.9 % 100 mL IVPB        1 g 200 mL/hr over 30 Minutes Intravenous Every 24 hours 01/30/23 2354 02/02/23 0253        Subjective: Today, patient was seen and examined at bedside.  Patient stated that she could not sleep well.  Has had a bowel movement this morning but did not have any blood in the stool.   Has mild abdominal discomfort.  Denies any chest pain, shortness of breath.  Objective: Vitals:   02/03/23 0024 02/03/23 0500 02/03/23 0802 02/03/23 0829  BP:  131/87 (!) 141/82   Pulse: 90 93 96 98  Resp:  18  16  Temp:  98 F (36.7 C) 98.8 F (37.1 C)   TempSrc:  Oral    SpO2: 98% 100% 100% 98%  Weight:        Intake/Output Summary (Last 24 hours) at 02/03/2023 1055 Last data filed at 02/03/2023 0427 Gross per 24 hour  Intake 165.19 ml  Output 400 ml  Net -234.81 ml   Filed Weights   01/31/23 0649 01/31/23 0938  Weight: 103.1 kg 103.1 kg    Physical Examination: Body mass index is 37.82 kg/m.   General: Obese built, not in obvious distress HENT:   Mild pallor noted.  Oral mucosa is moist.  Chest:  .  Diminished breath sounds bilaterally. No crackles or wheezes.  CVS: S1 &S2 heard. No murmur.  Regular rate and rhythm. Abdomen: Soft, nontender, nondistended.  Bowel sounds are heard.   Extremities: No cyanosis, clubbing or edema.  Peripheral pulses are palpable. Psych: Alert, awake and oriented, normal mood CNS:  No cranial nerve deficits.  Power equal in all extremities.   Skin: Warm and dry.  No rashes noted.  Data Reviewed:   CBC: Recent Labs  Lab 01/30/23 1954 01/30/23 2305 02/01/23 1200 02/01/23 1800 02/02/23 0045 02/02/23 0327 02/02/23 0557 02/02/23 1141 02/03/23 0744  WBC 13.8*   < > 11.0* 10.7* 11.3*  --  13.0* 13.6*  --   NEUTROABS 11.4*  --   --   --   --   --   --   --   --   HGB 3.0*   < > 7.8* 6.8* 7.0* 7.1* 7.2* 8.1* 7.2*  HCT 10.0*   < > 23.0* 20.1* 20.8* 21.1* 21.9* 24.1* 21.4*  MCV 99.0   < > 88.8 87.4 88.1  --  88.7 89.9  --   PLT 350   < > 235 206 202  --  191 218  --    < > = values in this interval not displayed.    Basic Metabolic Panel: Recent Labs  Lab 01/31/23 0059 01/31/23 0406 01/31/23 1836 02/01/23 0525 02/02/23 0557 02/02/23 1141 02/03/23 0744  NA 134* 135 139 139 134*  --  135  K 4.2 3.8 3.3* 3.1* 3.4*  --  3.1*   CL 102 102 103 106 107  --  104  CO2 16* 17* 19* 21* 19*  --  21*  GLUCOSE 125* 97 173* 149* 113*  --  100*  BUN 67* 72* 57* 48* 39*  --  25*  CREATININE 2.52* 2.37* 2.14* 1.87* 1.44*  --  1.13*  CALCIUM 7.9* 7.5* 7.3* 7.2* 7.1*  --  7.4*  MG 2.3 2.2  --   --   --  2.2 2.2  PHOS 6.2* 6.0*  --   --   --  1.4* 2.2*    Liver Function Tests: Recent Labs  Lab 01/30/23 1933 01/31/23 0406 02/02/23 1141  AST 52* 63* 35  ALT 29 33 32  ALKPHOS 72 72 58  BILITOT 0.7 0.6 0.5  PROT 6.0* 5.6* 5.8*  ALBUMIN 2.9* 2.8* 2.3*     Radiology Studies: DG Abd 1 View  Result Date: 02/02/2023 CLINICAL DATA:  Abdominal pain, small-bowel obstruction. EXAM: ABDOMEN - 1 VIEW COMPARISON:  February 01, 2023. FINDINGS: No abnormal bowel dilatation. No radio-opaque calculi or other significant radiographic abnormality are seen. IMPRESSION: No abnormal bowel dilatation. Electronically Signed   By: Lupita Raider M.D.   On: 02/02/2023 11:28   CT ANGIO GI BLEED  Result Date: 02/01/2023 CLINICAL DATA:  Lower GI bleeding.  History of bariatric surgery. EXAM: CTA ABDOMEN AND PELVIS WITHOUT AND WITH CONTRAST TECHNIQUE: Multidetector CT imaging of the abdomen and pelvis was performed using the standard protocol during bolus administration of intravenous contrast. Multiplanar reconstructed images and MIPs were obtained and reviewed to evaluate the vascular anatomy. RADIATION DOSE REDUCTION: This exam was performed according to the departmental dose-optimization program which includes automated exposure control, adjustment of the mA and/or kV according to patient size and/or use of iterative reconstruction technique. CONTRAST:  65mL OMNIPAQUE IOHEXOL 350 MG/ML SOLN COMPARISON:  CTA GI bleed study 01/30/2023 FINDINGS: VASCULAR Aorta: Normal caliber aorta without aneurysm, dissection, vasculitis or significant stenosis. Celiac: Patent without evidence of aneurysm, dissection, vasculitis or significant stenosis. SMA: Patent  without evidence of aneurysm, dissection, vasculitis or significant stenosis. Renals: Both renal arteries are patent without evidence of aneurysm, dissection, vasculitis, fibromuscular dysplasia or significant stenosis. IMA: Patent without evidence of aneurysm, dissection, vasculitis or significant stenosis. Inflow: Patent without evidence of aneurysm, dissection, vasculitis or significant stenosis. Proximal Outflow: Bilateral common femoral and visualized portions of the superficial and profunda femoral arteries are patent without evidence of aneurysm, dissection, vasculitis or significant stenosis. Veins: No obvious venous abnormality within the limitations of this arterial phase study. Review of the MIP images confirms the above findings. NON-VASCULAR Lower chest: There are trace bilateral pleural effusions. Hepatobiliary: Hyperdensity throughout the gallbladder is likely related to excreted contrast. Gallbladder sludge is not excluded. No biliary ductal dilatation. There is a 2.2 cm cyst in the liver. No new liver lesions are seen. Pancreas: Unremarkable. No pancreatic ductal dilatation or surrounding inflammatory changes. Spleen: Normal in size without focal abnormality. Adrenals/Urinary Tract: There is a low-density right adrenal nodule measuring to 2.3 cm compatible with adenoma. The left adrenal gland is within normal limits. There is a 16 mm cyst in the left kidney. There is no hydronephrosis or perinephric fluid. The bladder is decompressed by Foley catheter. Stomach/Bowel: Examination is limited to assess for colonic bleeding secondary to retained hyperdense contrast. No definite acute hemorrhage identified within the bowel. There is diffuse colonic wall thickening most significant from the level of the splenic flexure to the level the rectum with mild surrounding inflammation compatible with colitis. The appendix is not seen. Patient is status post gastric bypass surgery. The stomach is nondilated.  Jejunal loops in the left abdomen are now/newly dilated measuring up to 4.6 cm definitive transition point is not visualized, but there are nondilated small bowel loops distally. Lymphatic: No enlarged lymph nodes. Reproductive: Status post hysterectomy. No adnexal masses. Other: No abdominal wall hernia or abnormality. No abdominopelvic ascites. Musculoskeletal: Posterior fusion changes are seen at L4-L5. Degenerative changes affect the spine. IMPRESSION: VASCULAR 1. No evidence for aortic dissection or aneurysm. 2.  No acute gastrointestinal hemorrhage identified. Evaluation of the colon is limited secondary to retained hyperdense material. NON-VASCULAR 1. Diffuse colonic wall thickening compatible with colitis. 2. Dilated jejunal loops in the left abdomen concerning for small bowel obstruction. No definitive transition point is visualized. 3. Trace bilateral pleural effusions. 4. Right adrenal adenoma. Electronically Signed   By: Darliss Cheney M.D.   On: 02/01/2023 21:49      LOS: 3 days    Joycelyn Das, MD [facility] Available via Epic secure chat 7am-7pm After these hours, please refer to coverage provider listed on [email or web address] 02/03/2023, 10:55 AM

## 2023-02-03 NOTE — Progress Notes (Addendum)
Nutrition Follow-up  DOCUMENTATION CODES:   Obesity unspecified  INTERVENTION:  - Add Boost Breeze po TID, each supplement provides 250 kcal and 9 grams of protein  - Advance diet per GI recommendations.   - Add MVI BID.   NUTRITION DIAGNOSIS:   Inadequate oral intake related to lethargy/confusion as evidenced by NPO status.  GOAL:   Patient will meet greater than or equal to 90% of their needs - Not met, continue as goal.   MONITOR:   Diet advancement, I & O's, Labs  REASON FOR ASSESSMENT:   Consult Assessment of nutrition requirement/status  ASSESSMENT:   Pt with hx of CAD, COPD, GERD, DM type 2, hypothyroidism, HTN, HLD, hx of roux-en-y gastric bypass 2016, and CKD3 presented to ED with abdominal pain with vomiting blood and dark stools for several days.  Meds reviewed:  sliding scale insulin, sodium bicarbonate, Vit D. Labs reviewed: K low, BUN/Creatinine elevated, phos low.   The pt has been advanced to a CL diet on 02/01/23. Pt reports that she did not receive a breakfast tray this am. However, per [facility], pt did receive a breakfast tray. Pt reports that she would like to try Boost Breeze supplements. RD will add supplements. RD will continue to monitor for diet advancement. Pt reports that she was eating well PTA and denies any wt loss.   Diet Order:   Diet Order             Diet clear liquid Room service appropriate? Yes; Fluid consistency: Thin  Diet effective now                   EDUCATION NEEDS:   Not appropriate for education at this time  Skin:  Skin Assessment: Reviewed RN Assessment  Last BM:  8/22 - type 7  Height:   Ht Readings from Last 1 Encounters:  01/12/23 5\' 5"  (1.651 m)    Weight:   Wt Readings from Last 1 Encounters:  01/31/23 103.1 kg    Ideal Body Weight:  56.8 kg  BMI:  Body mass index is 37.82 kg/m.  Estimated Nutritional Needs:   Kcal:  1900-2100 kcal/d  Protein:  90-110 g/d  Fluid:  >/=2L/d  Bethann Humble, RD, LDN, CNSC.

## 2023-02-03 NOTE — Progress Notes (Signed)
Occupational Therapy Treatment Patient Details Name: Caroline Williams MRN: [id number] DOB: [birth year] Today's Date: 02/03/2023   History of present illness Pt is a 62 y/o female presenting with melena x4days, abdominal pain, and nonbloody emesis. +acidosis, anemia, AKI. PMH includes: L4-5 PLIF 7/31, anxiety, arthritis, bipolar 1, CAD, COPD, depression, DM, HTN, hernia repair, R TKR.   OT comments  Pt progressing slowly toward established OT goals. Brace still not present in room, so deferred further distance mobility. Pt performing UB ther-ex with mod cues for upright position of head sitting EOB to facilitate improved posture and functional strength for participation in daily life activity. Able to recall 2/3 spinal precautions initially. Fair maintenance with up to min cues for precautions during session. Pt performing STS with mod A of 2 as well as side steps toward HOB.       If plan is discharge home, recommend the following:  A lot of help with walking and/or transfers;A lot of help with bathing/dressing/bathroom;Assistance with cooking/housework;Direct supervision/assist for medications management;Direct supervision/assist for financial management;Assist for transportation;Help with stairs or ramp for entrance   Equipment Recommendations  None recommended by OT    Recommendations for Other Services Rehab consult    Precautions / Restrictions Precautions Precautions: Fall;Back Precaution Comments: reviewed verbally; handout in room. Pt recalling 2/3 spinal precautions on command Required Braces or Orthoses: Spinal Brace Spinal Brace: Lumbar corset Restrictions Weight Bearing Restrictions: No Other Position/Activity Restrictions: brace not present and no family present. Limited activity to basic mobility       Mobility Bed Mobility Overal bed mobility: Needs Assistance Bed Mobility: Rolling, Sidelying to Sit, Sit to Sidelying Rolling: Min assist Sidelying to sit: Mod assist,  +2 for physical assistance, +2 for safety/equipment     Sit to sidelying: Mod assist, +2 for safety/equipment, +2 for physical assistance General bed mobility comments: pt with fair recall of log roll method; min cues for optimal technique    Transfers Overall transfer level: Needs assistance Equipment used: Rolling walker (2 wheels) Transfers: Sit to/from Stand Sit to Stand: Mod assist, +2 physical assistance, +2 safety/equipment           General transfer comment: Mod A for first stand and approaching min A +2 with bed slightly elevated due to pt discomfort with transfer on second attempt. Able to take side steps toward [facility] with light assist for balance.     Balance Overall balance assessment: Needs assistance Sitting-balance support: No upper extremity supported, Feet supported Sitting balance-Leahy Scale: Fair     Standing balance support: Bilateral upper extremity supported Standing balance-Leahy Scale: Poor                             ADL either performed or assessed with clinical judgement   ADL Overall ADL's : Needs assistance/impaired     Grooming: Wash/dry face;Set up;Sitting Grooming Details (indicate cue type and reason): EOB Upper Body Bathing: Minimal assistance;Sitting Upper Body Bathing Details (indicate cue type and reason): to wash back             Toilet Transfer: Moderate assistance;+2 for physical assistance;+2 for safety/equipment             General ADL Comments: min A for steps toward [facility]    Extremity/Trunk Assessment Upper Extremity Assessment Upper Extremity Assessment: Generalized weakness   Lower Extremity Assessment Lower Extremity Assessment: Defer to PT evaluation        Vision   Vision Assessment?:  No apparent visual deficits   Perception Perception Perception: Not tested   Praxis Praxis Praxis: Not tested    Cognition Arousal: Lethargic Behavior During Therapy: Flat affect Overall Cognitive Status:  Impaired/Different from baseline Area of Impairment: Memory, Following commands, Safety/judgement, Awareness, Problem solving, Attention                   Current Attention Level: Focused, Sustained (due to intermittently poor arousal) Memory: Decreased recall of precautions Following Commands: Follows one step commands consistently, Follows one step commands with increased time Safety/Judgement: Decreased awareness of safety Awareness: Emergent, Intellectual Problem Solving: Slow processing General Comments: able to recall 2/3 spinal precautions. Following commands with increased time and intermittent encouragement.        Exercises Exercises: Other exercises Other Exercises Other Exercises: BUE shoulder flexion, seated row, and chair push ups x10    Shoulder Instructions       General Comments VSS    Pertinent Vitals/ Pain       Pain Assessment Pain Assessment: Faces Faces Pain Scale: Hurts a little bit Pain Location: generalized with transfers Pain Descriptors / Indicators: Discomfort, Operative site guarding Pain Intervention(s): Limited activity within patient's tolerance, Monitored during session  Home Living                                          Prior Functioning/Environment              Frequency  Min 1X/week        Progress Toward Goals  OT Goals(current goals can now be found in the care plan section)  Progress towards OT goals: Progressing toward goals  Acute Rehab OT Goals Patient Stated Goal: get back home to her dogs OT Goal Formulation: With patient Time For Goal Achievement: 02/15/23 Potential to Achieve Goals: Good ADL Goals Pt Will Perform Grooming: with contact guard assist;standing Pt Will Perform Upper Body Dressing: with set-up;sitting Pt Will Perform Lower Body Dressing: with min assist;sit to/from stand Pt Will Transfer to Toilet: with contact guard assist;ambulating;regular height toilet Additional  ADL Goal #1: pt will indep recall 3/3 back precuations  Plan Discharge plan remains appropriate;Frequency remains appropriate    Co-evaluation    PT/OT/SLP Co-Evaluation/Treatment:  (dovetail) Reason for Co-Treatment: Complexity of the patient's impairments (multi-system involvement);For patient/therapist safety          AM-PAC OT "6 Clicks" Daily Activity     Outcome Measure   Help from another person eating meals?: A Lot Help from another person taking care of personal grooming?: A Lot Help from another person toileting, which includes using toliet, bedpan, or urinal?: Total Help from another person bathing (including washing, rinsing, drying)?: A Lot Help from another person to put on and taking off regular upper body clothing?: A Lot Help from another person to put on and taking off regular lower body clothing?: Total 6 Click Score: 10    End of Session Equipment Utilized During Treatment: Rolling walker (2 wheels)  OT Visit Diagnosis: Other abnormalities of gait and mobility (R26.89);Pain;Muscle weakness (generalized) (M62.81)   Activity Tolerance Patient tolerated treatment well   Patient Left in bed;with call bell/phone within reach;with bed alarm set   Nurse Communication Mobility status        Time: 6578-4696 OT Time Calculation (min): 18 min  Charges: OT General Charges $OT Visit: 1 Visit OT Treatments $Therapeutic Activity: 8-22 mins  Tyler Deis, OTR/L [facility] Acute Rehabilitation Office: [phone]   Caroline Williams 02/03/2023, 1:23 PM

## 2023-02-03 NOTE — Hospital Course (Addendum)
62 year old female with history of coronary artery disease, COPD, GERD, thyroid disease presented to the hospital after several days of abdominal pain, vomiting and dark stool. Not on blood thinners, no report of heavy alcohol use.  On initial presentation patient had hemoglobin of 3.  Patient was initially admitted to the intensive care unit for management of acute blood loss anemia secondary to GI bleed and underwent endoscopic evaluation on 01/31/2023 by GI.  Patient was subsequently considered stable for transfer out of the ICU.   Assessment and Plan: Acute blood loss anemia Gastrointestinal hemorrhage with melena  Patient received multiple units of transfusion including 5 units PRBC, 1 unit each of platelets, FFP, cryoprecipitate during hospitalization.  Underwent EGD without obvious upper GI source.  Hemodynamically was stable but had some tachycardia.  Continue Protonix.Marland Kitchen CTA GI bleed if more melena.  GI on board.  Will continue to monitor hemoglobin.  Transfuse for hemoglobin less than 7.    Hypophosphatemia, phosphorus of 1.4 on 02/02/2023.  Has been replenished.  Check phosphorus level today.   UTI Completed course of Rocephin on 02/02/2023   AKI on CKD - Concern for ATN due to hypovolemia and profound anemia.  Latest creatinine of 1.4.  Monitor BMP.   Hypothyroidism Markedly elevated TSH and low T4.  Continue Synthroid, hydrocortisone    Acute urinary retention - On Foley catheter.  Diabetes mellitus Continue sliding scale insulin Accu-Cheks diabetic diet.  Continue to hold Farxiga   Coronary artery disease Imdur on hold.   COPD Continue home umeclidinium-vilanterol   Acute metabolic encephalopathy - Secondary to blood loss and profound anemia.  Improved.  Avoid sedatives.   Chronic pain status post L4-5 decompression and fusion Patient is Home MS Contin and oxycodone.  Linzess on hold.   History of mood disorder, bipolar 1, ADHD Continue Adderall, doxepin, Rexulti,  Trintellix, all currently held in setting of encephalopathy.  Continue Xanax.   Vitamin D deficiency: Continue vitamin D 50,000 units p.o. weekly, follow with PCP to repeat vitamin D level after 3 to 6 months.  Debility, deconditioning.  PT has recommended CIR at this time.

## 2023-02-03 NOTE — Progress Notes (Signed)
Physical Therapy Treatment Patient Details Name: Caroline Williams MRN: [id number] DOB: [birth year] Today's Date: 02/03/2023   History of Present Illness Pt is a 62 y/o female presenting with melena x4days, abdominal pain, and nonbloody emesis. +acidosis, anemia, AKI. PMH includes: L4-5 PLIF 7/31, anxiety, arthritis, bipolar 1, CAD, COPD, depression, DM, HTN, hernia repair, R TKR.    PT Comments  Pt greeted resting in bed and agreeable to session with steady progress towards acute goals, however pt continues to be limited by pain and fatigue. Pt able to recall 2/3 back precautions throughout session and demonstrating good adherence with bed mobility and transfers. Pt requiring up to mod A +2 to complete bed mobility and transfer to stand x2 trials with pt demonstrating improved LE engagement on last trial needing min A +2 to rise. Pt declining ambulation away from EOB, however pt able to take a few steps along EOB to [facility]. Current plan remains appropriate to address deficits and maximize functional independence and decrease caregiver burden. Pt continues to benefit from skilled PT services to progress toward functional mobility goals.       If plan is discharge home, recommend the following: Assistance with cooking/housework;Assist for transportation;Help with stairs or ramp for entrance;A lot of help with walking and/or transfers;A lot of help with bathing/dressing/bathroom;Supervision due to cognitive status   Can travel by private vehicle        Equipment Recommendations  None recommended by PT    Recommendations for Other Services       Precautions / Restrictions Precautions Precautions: Fall;Back Precaution Comments: reviewed verbally; handout in room. Pt recalling 2/3 spinal precautions on command Required Braces or Orthoses: Spinal Brace Spinal Brace: Lumbar corset Restrictions Weight Bearing Restrictions: No Other Position/Activity Restrictions: brace not present and no family  present. Limited activity to basic mobility     Mobility  Bed Mobility Overal bed mobility: Needs Assistance Bed Mobility: Rolling, Sidelying to Sit, Sit to Sidelying Rolling: Min assist Sidelying to sit: Mod assist, +2 for physical assistance, +2 for safety/equipment     Sit to sidelying: Mod assist, +2 for safety/equipment, +2 for physical assistance General bed mobility comments: pt with fair recall of log roll method; min cues for optimal technique    Transfers Overall transfer level: Needs assistance Equipment used: Rolling walker (2 wheels) Transfers: Sit to/from Stand Sit to Stand: Mod assist, +2 physical assistance, +2 safety/equipment           General transfer comment: Mod A for first stand and approaching min A +2 with bed slightly elevated due to pt discomfort with transfer on second attempt. Able to take side steps toward [facility] with light assist for balance.    Ambulation/Gait                   Stairs             Wheelchair Mobility     Tilt Bed    Modified Rankin (Stroke Patients Only)       Balance Overall balance assessment: Needs assistance Sitting-balance support: No upper extremity supported, Feet supported Sitting balance-Leahy Scale: Fair     Standing balance support: Bilateral upper extremity supported Standing balance-Leahy Scale: Poor                              Cognition Arousal: Lethargic Behavior During Therapy: Flat affect Overall Cognitive Status: Impaired/Different from baseline Area of Impairment: Memory, Following commands, Safety/judgement, Awareness,  Problem solving, Attention                   Current Attention Level: Focused, Sustained (due to intermittently poor arousal) Memory: Decreased recall of precautions Following Commands: Follows one step commands consistently, Follows one step commands with increased time Safety/Judgement: Decreased awareness of safety Awareness: Emergent,  Intellectual Problem Solving: Slow processing General Comments: able to recall 2/3 spinal precautions. Following commands with increased time and intermittent encouragement.        Exercises Other Exercises Other Exercises: seated marching x20, LAQ x1 ea side    General Comments General comments (skin integrity, edema, etc.): VSS on RA      Pertinent Vitals/Pain Pain Assessment Faces Pain Scale: Hurts a little bit Pain Location: generalized with transfers Pain Descriptors / Indicators: Discomfort, Operative site guarding Pain Intervention(s): Limited activity within patient's tolerance, Monitored during session    Home Living                          Prior Function            PT Goals (current goals can now be found in the care plan section) Acute Rehab PT Goals PT Goal Formulation: With patient Time For Goal Achievement: 02/15/23 Progress towards PT goals: Progressing toward goals    Frequency    Min 1X/week      PT Plan      Co-evaluation   Reason for Co-Treatment: Complexity of the patient's impairments (multi-system involvement);For patient/therapist safety          AM-PAC PT "6 Clicks" Mobility   Outcome Measure  Help needed turning from your back to your side while in a flat bed without using bedrails?: A Little Help needed moving from lying on your back to sitting on the side of a flat bed without using bedrails?: A Lot Help needed moving to and from a bed to a chair (including a wheelchair)?: A Lot Help needed standing up from a chair using your arms (e.g., wheelchair or bedside chair)?: A Lot Help needed to walk in hospital room?: A Lot Help needed climbing 3-5 steps with a railing? : Total 6 Click Score: 12    End of Session   Activity Tolerance: Patient limited by fatigue;Patient limited by pain Patient left: in bed;with call bell/phone within reach;with bed alarm set Nurse Communication: Mobility status PT Visit Diagnosis:  Unsteadiness on feet (R26.81);Other abnormalities of gait and mobility (R26.89)     Time: 2130-8657 PT Time Calculation (min) (ACUTE ONLY): 19 min  Charges:    $Therapeutic Activity: 8-22 mins PT General Charges $$ ACUTE PT VISIT: 1 Visit                     Tru Rana R. PTA Acute Rehabilitation Services Office: [phone]   Catalina Antigua 02/03/2023, 4:38 PM

## 2023-02-03 NOTE — Care Management Important Message (Signed)
Important Message  Patient Details  Name: Caroline Williams MRN: [id number] Date of Birth: 1961-05-27   Medicare Important Message Given:  Yes     Dorena Bodo 02/03/2023, 3:09 PM

## 2023-02-04 ENCOUNTER — Encounter (HOSPITAL_COMMUNITY): Payer: Self-pay | Admitting: Pulmonary Disease

## 2023-02-04 DIAGNOSIS — K922 Gastrointestinal hemorrhage, unspecified: Secondary | ICD-10-CM | POA: Diagnosis not present

## 2023-02-04 DIAGNOSIS — D62 Acute posthemorrhagic anemia: Secondary | ICD-10-CM | POA: Diagnosis not present

## 2023-02-04 DIAGNOSIS — E038 Other specified hypothyroidism: Secondary | ICD-10-CM | POA: Diagnosis not present

## 2023-02-04 DIAGNOSIS — K921 Melena: Secondary | ICD-10-CM | POA: Diagnosis not present

## 2023-02-04 LAB — BASIC METABOLIC PANEL
Anion gap: 12 (ref 5–15)
BUN: 20 mg/dL (ref 8–23)
CO2: 21 mmol/L — ABNORMAL LOW (ref 22–32)
Calcium: 7.5 mg/dL — ABNORMAL LOW (ref 8.9–10.3)
Chloride: 103 mmol/L (ref 98–111)
Creatinine, Ser: 1.14 mg/dL — ABNORMAL HIGH (ref 0.44–1.00)
GFR, Estimated: 54 mL/min — ABNORMAL LOW (ref >60)
Glucose, Bld: 97 mg/dL (ref 70–99)
Potassium: 3.1 mmol/L — ABNORMAL LOW (ref 3.5–5.1)
Sodium: 136 mmol/L (ref 135–145)

## 2023-02-04 LAB — TYPE AND SCREEN
ABO/RH(D): AB POS
Antibody Screen: NEGATIVE
Unit division: 0

## 2023-02-04 LAB — HEMOGLOBIN AND HEMATOCRIT, BLOOD
HCT: 23.9 % — ABNORMAL LOW (ref 36.0–46.0)
HCT: 26.4 % — ABNORMAL LOW (ref 36.0–46.0)
Hemoglobin: 8.1 g/dL — ABNORMAL LOW (ref 12.0–15.0)
Hemoglobin: 8.8 g/dL — ABNORMAL LOW (ref 12.0–15.0)

## 2023-02-04 LAB — BPAM RBC
Blood Product Expiration Date: 202409082359
ISSUE DATE / TIME: 202408222036
Unit Type and Rh: 6200

## 2023-02-04 LAB — GLUCOSE, CAPILLARY
Glucose-Capillary: 109 mg/dL — ABNORMAL HIGH (ref 70–99)
Glucose-Capillary: 116 mg/dL — ABNORMAL HIGH (ref 70–99)
Glucose-Capillary: 120 mg/dL — ABNORMAL HIGH (ref 70–99)
Glucose-Capillary: 141 mg/dL — ABNORMAL HIGH (ref 70–99)
Glucose-Capillary: 94 mg/dL (ref 70–99)
Glucose-Capillary: 95 mg/dL (ref 70–99)

## 2023-02-04 LAB — MAGNESIUM: Magnesium: 2 mg/dL (ref 1.7–2.4)

## 2023-02-04 LAB — PHOSPHORUS: Phosphorus: 2 mg/dL — ABNORMAL LOW (ref 2.5–4.6)

## 2023-02-04 MED ORDER — PEG-KCL-NACL-NASULF-NA ASC-C 100 G PO SOLR
0.5000 | Freq: Once | ORAL | Status: AC
  Administered 2023-02-04: 100 g via ORAL
  Filled 2023-02-04: qty 1

## 2023-02-04 MED ORDER — PEG-KCL-NACL-NASULF-NA ASC-C 100 G PO SOLR: 1.0000 | Freq: Once | ORAL | Status: DC

## 2023-02-04 MED ORDER — HYDRALAZINE HCL 20 MG/ML IJ SOLN
10.0000 mg | Freq: Four times a day (QID) | INTRAMUSCULAR | Status: DC | PRN
  Administered 2023-02-04: 10 mg via INTRAVENOUS
  Filled 2023-02-04: qty 1

## 2023-02-04 MED ORDER — POTASSIUM CHLORIDE CRYS ER 20 MEQ PO TBCR
40.0000 meq | EXTENDED_RELEASE_TABLET | Freq: Once | ORAL | Status: AC
  Administered 2023-02-04: 40 meq via ORAL
  Filled 2023-02-04: qty 2

## 2023-02-04 MED ORDER — BISACODYL 10 MG RE SUPP: 10.0000 mg | Freq: Once | RECTAL | Status: DC

## 2023-02-04 MED ORDER — ALPRAZOLAM 0.5 MG PO TABS
1.0000 mg | ORAL_TABLET | Freq: Three times a day (TID) | ORAL | Status: DC | PRN
  Administered 2023-02-04 – 2023-02-07 (×6): 1 mg via ORAL
  Filled 2023-02-04 (×6): qty 2

## 2023-02-04 MED ORDER — POTASSIUM PHOSPHATES 15 MMOLE/5ML IV SOLN
20.0000 mmol | Freq: Once | INTRAVENOUS | Status: AC
  Administered 2023-02-04: 20 mmol via INTRAVENOUS
  Filled 2023-02-04: qty 6.67

## 2023-02-04 MED FILL — Sodium Chloride IV Soln 0.9%: INTRAVENOUS | Qty: 1000 | Status: AC

## 2023-02-04 MED FILL — Heparin Sodium (Porcine) Inj 1000 Unit/ML: INTRAMUSCULAR | Qty: 30 | Status: AC

## 2023-02-04 NOTE — Progress Notes (Signed)
Inpatient Rehab Admissions Coordinator:  Saw pt at bedside to inform her that insurance has denied authorization for CIR. She would now prefer [facility] therapy. TOC made aware.   Wolfgang Phoenix, MS, CCC-SLP Admissions Coordinator [phone]

## 2023-02-04 NOTE — Progress Notes (Signed)
Subjective: Some melena and hematochezia. No abdominal pain.  Is hungry  Objective: Vital signs in last 24 hours: Temp:  [98.2 F (36.8 C)-99.1 F (37.3 C)] 98.5 F (36.9 C) (08/23 0845) Pulse Rate:  [83-93] 87 (08/23 1119) Resp:  [14-19] 18 (08/23 0845) BP: (126-154)/(74-96) 151/88 (08/23 1119) SpO2:  [96 %-100 %] 100 % (08/23 1119) Weight change:  Last BM Date : 02/03/23  PE: GEN:  NAD, markedly improved mental status NEURO:  Alert, answers questions appropriately ABD:  Protuberant, non distended  Lab Results: CBC    Component Value Date/Time   WBC 13.6 (H) 02/02/2023 1141   RBC 2.68 (L) 02/02/2023 1141   HGB 8.1 (L) 02/04/2023 0807   HGB 13.1 05/21/2021 0846   HCT 23.9 (L) 02/04/2023 0807   HCT 40.9 05/21/2021 0846   PLT 218 02/02/2023 1141   PLT 503 (H) 05/21/2021 0846   MCV 89.9 02/02/2023 1141   MCV 83 05/21/2021 0846   MCH 30.2 02/02/2023 1141   MCHC 33.6 02/02/2023 1141   RDW 16.6 (H) 02/02/2023 1141   RDW 13.3 05/21/2021 0846   LYMPHSABS 1.3 01/30/2023 1954   LYMPHSABS 1.2 05/21/2021 0846   MONOABS 1.0 01/30/2023 1954   EOSABS 0.0 01/30/2023 1954   EOSABS 0.1 05/21/2021 0846   BASOSABS 0.0 01/30/2023 1954   BASOSABS 0.0 05/21/2021 0846  CMP     Component Value Date/Time   NA 136 02/04/2023 0807   NA 138 10/07/2022 1443   K 3.1 (L) 02/04/2023 0807   CL 103 02/04/2023 0807   CO2 21 (L) 02/04/2023 0807   GLUCOSE 97 02/04/2023 0807   BUN 20 02/04/2023 0807   BUN 19 10/07/2022 1443   CREATININE 1.14 (H) 02/04/2023 0807   CALCIUM 7.5 (L) 02/04/2023 0807   PROT 5.8 (L) 02/02/2023 1141   PROT 7.4 10/07/2022 1443   ALBUMIN 2.3 (L) 02/02/2023 1141   ALBUMIN 4.5 10/07/2022 1443   AST 35 02/02/2023 1141   ALT 32 02/02/2023 1141   ALKPHOS 58 02/02/2023 1141   BILITOT 0.5 02/02/2023 1141   BILITOT 0.2 10/07/2022 1443   EGFR 43 (L) 10/07/2022 1443   GFRNONAA 54 (L) 02/04/2023 0807   Assessment:  Melena, resolved.  Negative endoscopy. Acute blood  loss anemia, improved after volume resuscitation. Altered mental status.  Much improved, able to interact and answer questions appropriately. Abnormal imaging:  Diffuse colitis on imaging?  Report possible jejunal region with obstruction.  She has no clinical findings to support bowel obstruction.  Plan:   Colonoscopy tomorrow.  Use low volume prep. If colonoscopy is unrevealing, will need to consider alternative means to evaluate small bowel (CT enterography versus other; she is not good capsule candidate due to gastric bypass Roux-en-Y anatomy). Follow CBC; transfuse if needed. Risks (bleeding, infection, bowel perforation that could require surgery, sedation-related changes in cardiopulmonary systems), benefits (identification and possible treatment of source of symptoms, exclusion of certain causes of symptoms), and alternatives (watchful waiting, radiographic imaging studies, empiric medical treatment) of colonoscopy were explained to patient/family in detail and patient wishes to proceed.  [facility] will follow.  Caroline Williams 02/04/2023, 1:29 PM   Cell [phone] If no answer or after 5 PM call [phone]

## 2023-02-04 NOTE — Progress Notes (Addendum)
PROGRESS NOTE    Caroline Williams  WGN:[id number] DOB: February 17, 1961 DOA: 01/30/2023 PCP: Hoy Register, MD    Brief Narrative:   62 year old female with history of coronary artery disease, COPD, GERD, thyroid disease presented to the hospital after several days of abdominal pain, vomiting and dark stool. Not on blood thinners, no report of heavy alcohol use.  On initial presentation patient had hemoglobin of 3.  Patient was initially admitted to the intensive care unit for management of acute blood loss anemia secondary to GI bleed and underwent endoscopic evaluation on 01/31/2023 by GI.  Patient was subsequently considered stable for transfer out of the ICU.  Assessment and Plan:  Acute blood loss anemia Gastrointestinal hemorrhage with melena Patient received multiple units of transfusion including 5 units PRBC, 1 unit each of platelets, FFP, cryoprecipitate during hospitalization.  Underwent EGD without obvious upper GI source.  Hemodynamically was stable but had some tachycardia.  Continue Protonix.  GI on board.  Hemoglobin was 6.6 yesterday and had multiple episodes of bright red blood 3-4 times yesterday.  Will communicate with GI again today.  Might need CTA of the abdomen versus colonoscopy.    Hypophosphatemia, phosphorus still low at 2.0.  Will give K-Phos 20 mmol today.  Hypokalemia.  Potassium of 3.1.  Will give a p.o. potassium and K-Phos 20 mmol.  Check levels in AM.  UTI Completed course of Rocephin on 02/02/2023   AKI on CKD - Concern for ATN due to hypovolemia and profound anemia.  Latest creatinine of 1.1.  Improved.  Monitor BMP.   Hypothyroidism Markedly elevated TSH and low T4.  Continue Synthroid, hydrocortisone    Acute urinary retention - On Foley catheter.  Will consider discontinuation of Foley catheter likely in 1 to 2 days.  Diabetes mellitus Continue sliding scale insulin, Accu-Cheks diabetic diet.  Continue to hold Farxiga   Coronary artery  disease Imdur on hold.  No acute issues.   COPD Continue home umeclidinium-vilanterol.  Appears comfortable.   Acute metabolic encephalopathy - Secondary to blood loss and profound anemia.  Improved.  Avoid sedatives.   Chronic pain status post L4-5 decompression and fusion Patient is on  MS Contin and oxycodone at home..  Linzess on hold.  Has been started on Percocet at this time due to diffuse pain.  Avoiding diclofenac gel for now.   History of mood disorder, bipolar 1, ADHD On Adderall, doxepin, Rexulti, Trintellix at home., all currently held in setting of encephalopathy.  Continue Xanax.  Resume gradually.   Vitamin D deficiency: Continue vitamin D 50,000 units p.o. weekly, follow with PCP to repeat vitamin D level after 3 to 6 months.  Debility, deconditioning.  PT has recommended CIR at this time.     DVT prophylaxis: SCDs Start: 01/31/23 0307   Code Status:     Code Status: Full Code  Disposition: CIR as per PT recommendation.  Status is: Inpatient  Remains inpatient appropriate because: Need for CIR, electrolyte imbalance, ongoing GI bleed.   Family Communication: None at bedside, spoke with the patient's husband on the phone and updated him about the clinical condition of the patient.  Consultants:  PCCM GI  Procedures:  EGD on 01/31/2023 Transfusion of PRBC and blood products  Antimicrobials:  Flagyl  Anti-infectives (From admission, onward)    Start     Dose/Rate Route Frequency Ordered Stop   02/02/23 0400  metroNIDAZOLE (FLAGYL) IVPB 500 mg        500 mg 100 mL/hr over  60 Minutes Intravenous Every 12 hours 02/02/23 0231 02/07/23 0359   01/31/23 0000  cefTRIAXone (ROCEPHIN) 1 g in sodium chloride 0.9 % 100 mL IVPB  Status:  Discontinued        1 g 200 mL/hr over 30 Minutes Intravenous  Once 01/30/23 2352 01/30/23 2354   01/31/23 0000  cefTRIAXone (ROCEPHIN) 1 g in sodium chloride 0.9 % 100 mL IVPB        1 g 200 mL/hr over 30 Minutes Intravenous  Every 24 hours 01/30/23 2354 02/02/23 0253        Subjective:  Today, patient was seen and examined at bedside.  Patient states that she could not sleep well and had 3-4 episodes of bright red blood yesterday.  Required 1 unit of packed RBC.  Wishes to eat more.  Objective: Vitals:   02/04/23 0024 02/04/23 0528 02/04/23 0830 02/04/23 0845  BP: (!) 144/74 (!) 154/96  137/81  Pulse: 85 84 83 88  Resp: 19 19 18 18   Temp: 98.5 F (36.9 C) 98.2 F (36.8 C)  98.5 F (36.9 C)  TempSrc: Oral     SpO2: 100% 98% 96% 97%  Weight:        Intake/Output Summary (Last 24 hours) at 02/04/2023 0929 Last data filed at 02/04/2023 0550 Gross per 24 hour  Intake 305 ml  Output 2050 ml  Net -1745 ml   Filed Weights   01/31/23 0649 01/31/23 0938  Weight: 103.1 kg 103.1 kg    Physical Examination: Body mass index is 37.82 kg/m.   General: Obese built, not in obvious distress HENT:   Pallor noted.  Oral mucosa is moist.  Chest:  .  Diminished breath sounds bilaterally. No crackles or wheezes.  CVS: S1 &S2 heard. No murmur.  Regular rate and rhythm. Abdomen: Soft, nontender, nondistended.  Bowel sounds are heard.   Extremities: No cyanosis, clubbing or edema.  Peripheral pulses are palpable. Psych: Alert, awake and oriented, slow speech, CNS:  No cranial nerve deficits.  Power equal in all extremities.   Skin: Warm and dry.  No rashes noted.  Data Reviewed:   CBC: Recent Labs  Lab 01/30/23 1954 01/30/23 2305 02/01/23 1200 02/01/23 1800 02/02/23 0045 02/02/23 0327 02/02/23 0557 02/02/23 1141 02/03/23 0744 02/03/23 1638 02/04/23 0807  WBC 13.8*   < > 11.0* 10.7* 11.3*  --  13.0* 13.6*  --   --   --   NEUTROABS 11.4*  --   --   --   --   --   --   --   --   --   --   HGB 3.0*   < > 7.8* 6.8* 7.0*   < > 7.2* 8.1* 7.2* 6.6* 8.1*  HCT 10.0*   < > 23.0* 20.1* 20.8*   < > 21.9* 24.1* 21.4* 19.1* 23.9*  MCV 99.0   < > 88.8 87.4 88.1  --  88.7 89.9  --   --   --   PLT 350   < > 235  206 202  --  191 218  --   --   --    < > = values in this interval not displayed.    Basic Metabolic Panel: Recent Labs  Lab 01/31/23 0059 01/31/23 0406 01/31/23 1836 02/01/23 0525 02/02/23 0557 02/02/23 1141 02/03/23 0744 02/04/23 0807  NA 134* 135 139 139 134*  --  135 136  K 4.2 3.8 3.3* 3.1* 3.4*  --  3.1* 3.1*  CL 102  102 103 106 107  --  104 103  CO2 16* 17* 19* 21* 19*  --  21* 21*  GLUCOSE 125* 97 173* 149* 113*  --  100* 97  BUN 67* 72* 57* 48* 39*  --  25* 20  CREATININE 2.52* 2.37* 2.14* 1.87* 1.44*  --  1.13* 1.14*  CALCIUM 7.9* 7.5* 7.3* 7.2* 7.1*  --  7.4* 7.5*  MG 2.3 2.2  --   --   --  2.2 2.2 2.0  PHOS 6.2* 6.0*  --   --   --  1.4* 2.2* 2.0*    Liver Function Tests: Recent Labs  Lab 01/30/23 1933 01/31/23 0406 02/02/23 1141  AST 52* 63* 35  ALT 29 33 32  ALKPHOS 72 72 58  BILITOT 0.7 0.6 0.5  PROT 6.0* 5.6* 5.8*  ALBUMIN 2.9* 2.8* 2.3*     Radiology Studies: DG Abd 1 View  Result Date: 02/02/2023 CLINICAL DATA:  Abdominal pain, small-bowel obstruction. EXAM: ABDOMEN - 1 VIEW COMPARISON:  February 01, 2023. FINDINGS: No abnormal bowel dilatation. No radio-opaque calculi or other significant radiographic abnormality are seen. IMPRESSION: No abnormal bowel dilatation. Electronically Signed   By: Lupita Raider M.D.   On: 02/02/2023 11:28      LOS: 4 days    Joycelyn Das, MD [facility] Available via Epic secure chat 7am-7pm After these hours, please refer to coverage provider listed on [email or web address] 02/04/2023, 9:29 AM

## 2023-02-04 NOTE — Anesthesia Preprocedure Evaluation (Signed)
Anesthesia Evaluation  Patient identified by MRN, date of birth, ID band Patient awake    Reviewed: Allergy & Precautions, NPO status , Patient's Chart, lab work & pertinent test results, reviewed documented beta blocker date and time   History of Anesthesia Complications Negative for: history of anesthetic complications  Airway Mallampati: II  TM Distance: >3 FB Neck ROM: Full    Dental  (+) Missing, Dental Advisory Given   Pulmonary sleep apnea (does not use CPAP) , COPD,  COPD inhaler, Patient abstained from smoking., former smoker   breath sounds clear to auscultation       Cardiovascular hypertension, Pt. on medications and Pt. on home beta blockers (-) angina + CAD (non-obstructive)   Rhythm:Regular Rate:Normal  12/2022 Stress:  Findings are consistent with no ischemia. The study is low risk.   No ST deviation was noted.   LV perfusion is normal.   Left ventricular function is abnormal. Global function is mildly reduced. Nuclear stress EF: 44%.   12/2022 ECHO: EF 50 to 55%. 1.  LV EF 51 %. The left ventricle has low normal function, global hypokinesis.  There is moderate concentric LVH. The global longitudinal strain is normal.   2. RVF is normal. RV size is normal. There is normal pulmonary artery systolic pressure. The estimated right ventricular systolic pressure is 18.4 mmHg.   3. The mitral valve is normal in structure. Mild to moderate MR. No evidence of mitral stenosis.   4. The aortic valve is tricuspid. AI is not visualized. Aortic valve sclerosis is present, with no evidence of aortic valve stenosis.     Neuro/Psych   Anxiety Depression Bipolar Disorder   negative neurological ROS     GI/Hepatic Neg liver ROS,GERD  Controlled,,  Endo/Other  diabetesHypothyroidism  BMI 38  Renal/GU Renal InsufficiencyRenal disease     Musculoskeletal  (+) Arthritis ,    Abdominal   Peds  Hematology  (+) Blood  dyscrasia (Hb 9.0), anemia , JEHOVAH'S WITNESS (accepts blood products)  Anesthesia Other Findings   Reproductive/Obstetrics                             Anesthesia Physical Anesthesia Plan  ASA: 3  Anesthesia Plan: MAC   Post-op Pain Management: Minimal or no pain anticipated   Induction:   PONV Risk Score and Plan: 2 and Treatment may vary due to age or medical condition  Airway Management Planned: Natural Airway and Simple Face Mask  Additional Equipment: None  Intra-op Plan:   Post-operative Plan:   Informed Consent: I have reviewed the patients History and Physical, chart, labs and discussed the procedure including the risks, benefits and alternatives for the proposed anesthesia with the patient or authorized representative who has indicated his/her understanding and acceptance.     Dental advisory given  Plan Discussed with: CRNA and Surgeon  Anesthesia Plan Comments:         Anesthesia Quick Evaluation

## 2023-02-04 NOTE — Progress Notes (Signed)
Physical Therapy Treatment Patient Details Name: Caroline Williams MRN: [id number] DOB: [birth year] Today's Date: 02/04/2023   History of Present Illness Pt is a 62 y/o female presenting with melena x4days, abdominal pain, and nonbloody emesis. +acidosis, anemia, AKI. PMH includes: L4-5 PLIF 7/31, anxiety, arthritis, bipolar 1, CAD, COPD, depression, DM, HTN, hernia repair, R TKR.    PT Comments  Pt pleasant and reports getting up to [facility] with nursing staff. Pt able to progress to +1 assist and in room ambulation this session but self-limiting and reports back pain and fatigue limiting further activity. Pt denied walking to toilet and up to [facility] end of session with pt educated for need to have family bring brace and to increase mobility for pt goal of returning home. Will continue to follow.      If plan is discharge home, recommend the following: Assistance with cooking/housework;Assist for transportation;Help with stairs or ramp for entrance;A lot of help with walking and/or transfers;A lot of help with bathing/dressing/bathroom;Supervision due to cognitive status   Can travel by private vehicle        Equipment Recommendations  None recommended by PT    Recommendations for Other Services       Precautions / Restrictions Precautions Precautions: Fall;Back Precaution Comments: reviewed verbally; handout in room. Pt recalling 2/3 spinal precautions on command with education for 3/3 beginning and end of session Restrictions Other Position/Activity Restrictions: brace not present and no family present. Pt encouraged to have family bring brace     Mobility  Bed Mobility Overal bed mobility: Needs Assistance Bed Mobility: Rolling, Sidelying to Sit Rolling: Contact guard assist Sidelying to sit: Min assist       General bed mobility comments: cues for sequence and to maintain precautions with all mobility    Transfers Overall transfer level: Needs assistance   Transfers: Sit  to/from Stand Sit to Stand: Min assist Stand pivot transfers: Min assist         General transfer comment: min assist with cues for hand placement to rise from surface at bed and recliner. pivot recliner to [facility] with RW with min assist    Ambulation/Gait Ambulation/Gait assistance: Contact guard assist Gait Distance (Feet): 30 Feet Assistive device: Rolling walker (2 wheels) Gait Pattern/deviations: Step-through pattern, Decreased stride length, Trunk flexed   Gait velocity interpretation: 1.31 - 2.62 ft/sec, indicative of limited community ambulator   General Gait Details: cues for posture, proximity to RW and increased distance. Pt refused further gait due to fatigue   Stairs             Wheelchair Mobility     Tilt Bed    Modified Rankin (Stroke Patients Only)       Balance Overall balance assessment: Needs assistance Sitting-balance support: No upper extremity supported, Feet supported Sitting balance-Leahy Scale: Fair     Standing balance support: Bilateral upper extremity supported Standing balance-Leahy Scale: Poor Standing balance comment: Rw in standing                            Cognition Arousal: Alert Behavior During Therapy: Flat affect Overall Cognitive Status: Impaired/Different from baseline Area of Impairment: Orientation, Memory                   Current Attention Level: Sustained Memory: Decreased recall of precautions Following Commands: Follows one step commands consistently       General Comments: pt recalling 2/3 precautions, self limiting with  functional activity        Exercises General Exercises - Lower Extremity Hip Flexion/Marching: AROM, Both, 15 reps, Standing    General Comments        Pertinent Vitals/Pain Pain Assessment Pain Score: 4  Pain Location: back Pain Descriptors / Indicators: Aching Pain Intervention(s): Limited activity within patient's tolerance, Repositioned, Monitored during  session    Home Living                          Prior Function            PT Goals (current goals can now be found in the care plan section) Progress towards PT goals: Progressing toward goals    Frequency    Min 1X/week      PT Plan      Co-evaluation              AM-PAC PT "6 Clicks" Mobility   Outcome Measure  Help needed turning from your back to your side while in a flat bed without using bedrails?: A Little Help needed moving from lying on your back to sitting on the side of a flat bed without using bedrails?: A Little Help needed moving to and from a bed to a chair (including a wheelchair)?: A Little Help needed standing up from a chair using your arms (e.g., wheelchair or bedside chair)?: A Little Help needed to walk in hospital room?: A Little Help needed climbing 3-5 steps with a railing? : Total 6 Click Score: 16    End of Session Equipment Utilized During Treatment: Gait belt Activity Tolerance: Patient limited by fatigue Patient left: with call bell/phone within reach;Other (comment) (BSC with call bell with NT aware) Nurse Communication: Mobility status PT Visit Diagnosis: Unsteadiness on feet (R26.81);Other abnormalities of gait and mobility (R26.89)     Time: 1610-9604 PT Time Calculation (min) (ACUTE ONLY): 25 min  Charges:    $Gait Training: 8-22 mins $Therapeutic Activity: 8-22 mins PT General Charges $$ ACUTE PT VISIT: 1 Visit                     Merryl Hacker, PT Acute Rehabilitation Services Office: [phone]    Enedina Finner Franchon Ketterman 02/04/2023, 11:24 AM

## 2023-02-05 ENCOUNTER — Inpatient Hospital Stay (HOSPITAL_COMMUNITY): Payer: Medicare HMO | Admitting: Anesthesiology

## 2023-02-05 ENCOUNTER — Encounter (HOSPITAL_COMMUNITY): Payer: Self-pay | Admitting: Pulmonary Disease

## 2023-02-05 ENCOUNTER — Inpatient Hospital Stay (HOSPITAL_COMMUNITY): Payer: Medicare HMO

## 2023-02-05 ENCOUNTER — Encounter (HOSPITAL_COMMUNITY)
Admission: EM | Disposition: A | Discharge status: Home Health | Payer: Self-pay | Source: Home / Self Care | Attending: Internal Medicine

## 2023-02-05 DIAGNOSIS — I129 Hypertensive chronic kidney disease with stage 1 through stage 4 chronic kidney disease, or unspecified chronic kidney disease: Secondary | ICD-10-CM | POA: Diagnosis not present

## 2023-02-05 DIAGNOSIS — K633 Ulcer of intestine: Secondary | ICD-10-CM | POA: Diagnosis not present

## 2023-02-05 DIAGNOSIS — K922 Gastrointestinal hemorrhage, unspecified: Secondary | ICD-10-CM | POA: Diagnosis not present

## 2023-02-05 DIAGNOSIS — N183 Chronic kidney disease, stage 3 unspecified: Secondary | ICD-10-CM

## 2023-02-05 DIAGNOSIS — E038 Other specified hypothyroidism: Secondary | ICD-10-CM | POA: Diagnosis not present

## 2023-02-05 DIAGNOSIS — I2510 Atherosclerotic heart disease of native coronary artery without angina pectoris: Secondary | ICD-10-CM | POA: Diagnosis not present

## 2023-02-05 DIAGNOSIS — K55039 Acute (reversible) ischemia of large intestine, extent unspecified: Secondary | ICD-10-CM | POA: Diagnosis not present

## 2023-02-05 DIAGNOSIS — D62 Acute posthemorrhagic anemia: Secondary | ICD-10-CM | POA: Diagnosis not present

## 2023-02-05 DIAGNOSIS — K921 Melena: Secondary | ICD-10-CM | POA: Diagnosis not present

## 2023-02-05 HISTORY — PX: BIOPSY: SHX5522

## 2023-02-05 HISTORY — PX: COLONOSCOPY WITH PROPOFOL: SHX5780

## 2023-02-05 LAB — BASIC METABOLIC PANEL
Anion gap: 12 (ref 5–15)
BUN: 17 mg/dL (ref 8–23)
CO2: 20 mmol/L — ABNORMAL LOW (ref 22–32)
Calcium: 7.7 mg/dL — ABNORMAL LOW (ref 8.9–10.3)
Chloride: 104 mmol/L (ref 98–111)
Creatinine, Ser: 1.01 mg/dL — ABNORMAL HIGH (ref 0.44–1.00)
GFR, Estimated: >60 mL/min (ref >60)
Glucose, Bld: 96 mg/dL (ref 70–99)
Potassium: 3.9 mmol/L (ref 3.5–5.1)
Sodium: 136 mmol/L (ref 135–145)

## 2023-02-05 LAB — GLUCOSE, CAPILLARY
Glucose-Capillary: 115 mg/dL — ABNORMAL HIGH (ref 70–99)
Glucose-Capillary: 124 mg/dL — ABNORMAL HIGH (ref 70–99)
Glucose-Capillary: 65 mg/dL — ABNORMAL LOW (ref 70–99)
Glucose-Capillary: 68 mg/dL — ABNORMAL LOW (ref 70–99)
Glucose-Capillary: 71 mg/dL (ref 70–99)
Glucose-Capillary: 78 mg/dL (ref 70–99)
Glucose-Capillary: 80 mg/dL (ref 70–99)
Glucose-Capillary: 82 mg/dL (ref 70–99)
Glucose-Capillary: 90 mg/dL (ref 70–99)

## 2023-02-05 LAB — LACTIC ACID, PLASMA
Lactic Acid, Venous: 2 mmol/L (ref 0.5–1.9)
Lactic Acid, Venous: 2.2 mmol/L (ref 0.5–1.9)

## 2023-02-05 LAB — CULTURE, BLOOD (ROUTINE X 2)
Culture: NO GROWTH
Culture: NO GROWTH

## 2023-02-05 LAB — HEMOGLOBIN AND HEMATOCRIT, BLOOD
HCT: 27.3 % — ABNORMAL LOW (ref 36.0–46.0)
HCT: 27.7 % — ABNORMAL LOW (ref 36.0–46.0)
Hemoglobin: 9 g/dL — ABNORMAL LOW (ref 12.0–15.0)
Hemoglobin: 9.1 g/dL — ABNORMAL LOW (ref 12.0–15.0)

## 2023-02-05 LAB — MAGNESIUM: Magnesium: 2.2 mg/dL (ref 1.7–2.4)

## 2023-02-05 LAB — PHOSPHORUS: Phosphorus: 1.7 mg/dL — ABNORMAL LOW (ref 2.5–4.6)

## 2023-02-05 SURGERY — COLONOSCOPY WITH PROPOFOL
Anesthesia: Monitor Anesthesia Care | Laterality: Left

## 2023-02-05 MED ORDER — CALCIUM POLYCARBOPHIL 625 MG PO TABS
625.0000 mg | ORAL_TABLET | Freq: Two times a day (BID) | ORAL | Status: DC
  Administered 2023-02-05 – 2023-02-08 (×7): 625 mg via ORAL
  Filled 2023-02-05 (×7): qty 1

## 2023-02-05 MED ORDER — METHOCARBAMOL 500 MG PO TABS: 1000.0000 mg | ORAL_TABLET | Freq: Four times a day (QID) | ORAL | Status: DC | PRN

## 2023-02-05 MED ORDER — LACTATED RINGERS IV SOLN: INTRAVENOUS | Status: DC

## 2023-02-05 MED ORDER — MAGIC MOUTHWASH: 15.0000 mL | Freq: Four times a day (QID) | ORAL | Status: DC | PRN

## 2023-02-05 MED ORDER — PROPOFOL 10 MG/ML IV BOLUS
INTRAVENOUS | Status: DC | PRN
  Administered 2023-02-05: 60 mg via INTRAVENOUS

## 2023-02-05 MED ORDER — DEXTROSE 50 % IV SOLN
10.0000 mL | Freq: Once | INTRAVENOUS | Status: AC
  Administered 2023-02-05: 12.5 mL via INTRAVENOUS

## 2023-02-05 MED ORDER — DEXTROSE 50 % IV SOLN
INTRAVENOUS | Status: AC
  Filled 2023-02-05: qty 50

## 2023-02-05 MED ORDER — PROCHLORPERAZINE EDISYLATE 10 MG/2ML IJ SOLN: 5.0000 mg | INTRAMUSCULAR | Status: DC | PRN

## 2023-02-05 MED ORDER — LIDOCAINE 2% (20 MG/ML) 5 ML SYRINGE
INTRAMUSCULAR | Status: DC | PRN
  Administered 2023-02-05: 100 mg via INTRAVENOUS

## 2023-02-05 MED ORDER — SALINE SPRAY 0.65 % NA SOLN: 1.0000 | Freq: Four times a day (QID) | NASAL | Status: DC | PRN

## 2023-02-05 MED ORDER — IOHEXOL 350 MG/ML SOLN
75.0000 mL | Freq: Once | INTRAVENOUS | Status: AC | PRN
  Administered 2023-02-05: 75 mL via INTRAVENOUS

## 2023-02-05 MED ORDER — SIMETHICONE 80 MG PO CHEW
80.0000 mg | CHEWABLE_TABLET | Freq: Four times a day (QID) | ORAL | Status: AC
  Administered 2023-02-05 – 2023-02-08 (×12): 80 mg via ORAL
  Filled 2023-02-05 (×12): qty 1

## 2023-02-05 MED ORDER — POTASSIUM PHOSPHATES 15 MMOLE/5ML IV SOLN
20.0000 mmol | Freq: Once | INTRAVENOUS | Status: AC
  Administered 2023-02-05: 20 mmol via INTRAVENOUS
  Filled 2023-02-05: qty 6.67

## 2023-02-05 MED ORDER — ONDANSETRON HCL 4 MG/2ML IJ SOLN: 4.0000 mg | Freq: Four times a day (QID) | INTRAMUSCULAR | Status: DC | PRN

## 2023-02-05 MED ORDER — NAPHAZOLINE-GLYCERIN 0.012-0.25 % OP SOLN: 1.0000 [drp] | Freq: Four times a day (QID) | OPHTHALMIC | Status: DC | PRN

## 2023-02-05 MED ORDER — SODIUM CHLORIDE 0.9 % IV SOLN
INTRAVENOUS | Status: DC
  Administered 2023-02-05: 1000 mL via INTRAVENOUS

## 2023-02-05 MED ORDER — ENSURE PRE-SURGERY PO LIQD
296.0000 mL | Freq: Once | ORAL | Status: DC
  Filled 2023-02-05: qty 296

## 2023-02-05 MED ORDER — METHOCARBAMOL 1000 MG/10ML IJ SOLN: 1000.0000 mg | Freq: Four times a day (QID) | INTRAVENOUS | Status: DC | PRN

## 2023-02-05 MED ORDER — PIPERACILLIN-TAZOBACTAM 3.375 G IVPB
3.3750 g | Freq: Three times a day (TID) | INTRAVENOUS | Status: DC
  Administered 2023-02-05 – 2023-02-08 (×9): 3.375 g via INTRAVENOUS
  Filled 2023-02-05 (×9): qty 50

## 2023-02-05 MED ORDER — ALUM & MAG HYDROXIDE-SIMETH 200-200-20 MG/5ML PO SUSP: 30.0000 mL | Freq: Four times a day (QID) | ORAL | Status: DC | PRN

## 2023-02-05 MED ORDER — LACTATED RINGERS IV BOLUS: 1000.0000 mL | Freq: Three times a day (TID) | INTRAVENOUS | Status: AC | PRN

## 2023-02-05 MED ORDER — PROPOFOL 500 MG/50ML IV EMUL
INTRAVENOUS | Status: DC | PRN
  Administered 2023-02-05: 100 ug/kg/min via INTRAVENOUS

## 2023-02-05 MED ORDER — PHENOL 1.4 % MT LIQD: 2.0000 | OROMUCOSAL | Status: DC | PRN

## 2023-02-05 MED ORDER — SIMETHICONE 40 MG/0.6ML PO SUSP: 80.0000 mg | Freq: Four times a day (QID) | ORAL | Status: DC | PRN

## 2023-02-05 MED ORDER — SODIUM CHLORIDE 0.9 % IV SOLN: 8.0000 mg | Freq: Four times a day (QID) | INTRAVENOUS | Status: DC | PRN

## 2023-02-05 MED ORDER — MENTHOL 3 MG MT LOZG: 1.0000 | LOZENGE | OROMUCOSAL | Status: DC | PRN

## 2023-02-05 SURGICAL SUPPLY — 22 items

## 2023-02-05 NOTE — Op Note (Signed)
[facility] Patient Name: Caroline Williams Procedure Date : 02/05/2023 MRN: [id number] Attending MD: Shirley Friar , MD, [id number] Date of Birth: May 25, 1961 CSN: [id number] Age: 62 Admit Type: Inpatient Procedure:                Colonoscopy Indications:              Melena, Acute post hemorrhagic anemia Providers:                Shirley Friar, MD, Eliberto Ivory, RN, Salley Scarlet, Technician Referring MD:             Hospital team Medicines:                Propofol per Anesthesia, Monitored Anesthesia Care Complications:            No immediate complications. Estimated Blood Loss:     Estimated blood loss was minimal. Procedure:                Pre-Anesthesia Assessment:                           - Prior to the procedure, a History and Physical                            was performed, and patient medications and                            allergies were reviewed. The patient's tolerance of                            previous anesthesia was also reviewed. The risks                            and benefits of the procedure and the sedation                            options and risks were discussed with the patient.                            All questions were answered, and informed consent                            was obtained. Prior Anticoagulants: The patient has                            taken no anticoagulant or antiplatelet agents. ASA                            Grade Assessment: III - A patient with severe                            systemic disease. After reviewing the risks and  benefits, the patient was deemed in satisfactory                            condition to undergo the procedure.                           After obtaining informed consent, the colonoscope                            was passed under direct vision. Throughout the                            procedure, the patient's blood  pressure, pulse, and                            oxygen saturations were monitored continuously. The                            PCF-190TL ([id number] Olympus colonoscope was                            introduced through the anus and advanced to the the                            hepatic flexure. The colonoscopy was performed with                            difficulty due to poor bowel prep with stool                            present and ulcers. Successful completion of the                            procedure was aided by performing the maneuvers                            documented (below) in this report. The patient                            tolerated the procedure well. The quality of the                            bowel preparation was poor. The rectum was                            photographed. Scope In: 9:14:18 AM Scope Out: 9:31:28 AM Scope Withdrawal Time: 0 hours 4 minutes 10 seconds  Total Procedure Duration: 0 hours 17 minutes 10 seconds  Findings:      Due to severe ulcerated mucosa, inadequate prep, and looping colonoscope       was not advanced proximal to the hepatic flexure.      Discontinuous areas of nonbleeding ulcerated mucosa with stigmata of       recent bleeding were present in the recto-sigmoid colon, in the sigmoid  colon and in the descending colon. Biopsies were taken with a cold       forceps for histology. Estimated blood loss was minimal.      A diffuse area of severely congested, erythematous, inflamed, ulcerated       and vascular-pattern-decreased mucosa was found in the entire colon.       Biopsies were taken with a cold forceps for histology. Estimated blood       loss was minimal.      Discontinuous areas of nonbleeding ulcerated mucosa with stigmata of       recent bleeding were present at the splenic flexure and in the       transverse colon. Biopsies were taken with a cold forceps for histology.      Internal hemorrhoids were found during  retroflexion. The hemorrhoids       were small and Grade I (internal hemorrhoids that do not prolapse). Impression:               - Preparation of the colon was poor.                           - Mucosal ulceration in the recto-sigmoid colon, in                            the sigmoid colon and in the descending colon.                            Biopsied.                           - Congested, erythematous, inflamed, ulcerated and                            vascular-pattern-decreased mucosa in the entire                            examined colon. Biopsied.                           - Mucosal ulceration at the splenic flexure and in                            the transverse colon. Biopsied.                           - Internal hemorrhoids.                           - Severe ulcerated mucosa concerning for ischemic                            colitis. Recommendation:           - Clear liquid diet.                           - Await pathology results.                           -  Repeat colonoscopy (date not yet determined) in                            near future as outpt.                           - Abd/pelvis CT scan.                           - Recheck Lactic acid.                           - Surgical consult. Procedure Code(s):        --- Professional ---                           [phone], 52, Colonoscopy, flexible; with biopsy,                            single or multiple Diagnosis Code(s):        --- Professional ---                           K92.1, Melena (includes Hematochezia)                           D62, Acute posthemorrhagic anemia                           K63.3, Ulcer of intestine                           K63.89, Other specified diseases of intestine                           K52.9, Noninfective gastroenteritis and colitis,                            unspecified                           K64.0, First degree hemorrhoids CPT copyright 2022 American [employer] Association. All  rights reserved. The codes documented in this report are preliminary and upon coder review may  be revised to meet current compliance requirements. Shirley Friar, MD 02/05/2023 9:47:03 AM This report has been signed electronically. Number of Addenda: 0

## 2023-02-05 NOTE — Anesthesia Postprocedure Evaluation (Signed)
Anesthesia Post Note  Patient: Caroline Williams  Procedure(s) Performed: COLONOSCOPY WITH PROPOFOL (Left) BIOPSY     Patient location during evaluation: Endoscopy Anesthesia Type: MAC Level of consciousness: sedated, patient cooperative and oriented Pain management: pain level controlled Vital Signs Assessment: post-procedure vital signs reviewed and stable Respiratory status: nonlabored ventilation, spontaneous breathing and respiratory function stable Cardiovascular status: stable and blood pressure returned to baseline Postop Assessment: no apparent nausea or vomiting Anesthetic complications: no   No notable events documented.  Last Vitals:  Vitals:   02/05/23 0950 02/05/23 1000  BP: (!) 156/96 (!) 159/99  Pulse: 93 91  Resp: (!) 25 19  Temp:    SpO2: 96% 96%    Last Pain:  Vitals:   02/05/23 1000  TempSrc:   PainSc: 0-No pain                 Cova Knieriem,E. Sakai Heinle

## 2023-02-05 NOTE — Transfer of Care (Signed)
Immediate Anesthesia Transfer of Care Note  Patient: Caroline Williams  Procedure(s) Performed: COLONOSCOPY WITH PROPOFOL (Left) BIOPSY  Patient Location: PACU and Endoscopy Unit  Anesthesia Type:MAC  Level of Consciousness: awake, alert , and oriented  Airway & Oxygen Therapy: Patient Spontanous Breathing  Post-op Assessment: Report given to RN and Post -op Vital signs reviewed and stable  Post vital signs: Reviewed and stable  Last Vitals:  Vitals Value Taken Time  BP 154/95 02/05/23 0940  Temp    Pulse    Resp 18 02/05/23 0940  SpO2    Vitals shown include unfiled device data.  Last Pain:  Vitals:   02/05/23 0837  TempSrc: Temporal  PainSc: 0-No pain      Patients Stated Pain Goal: 0 (02/04/23 1119)  Complications: No notable events documented.

## 2023-02-05 NOTE — Progress Notes (Addendum)
PROGRESS NOTE    Caroline Williams  ONG:[id number] DOB: 1960-07-17 DOA: 01/30/2023 PCP: Hoy Register, MD    Brief Narrative:   62 year old female with history of coronary artery disease, COPD, GERD, thyroid disease presented to the hospital after several days of abdominal pain, vomiting and dark stool. Not on blood thinners, no report of heavy alcohol use.  On initial presentation patient had hemoglobin of 3.0.  Patient was initially admitted to the intensive care unit for management of acute blood loss anemia secondary to GI bleed and underwent endoscopic evaluation on 01/31/2023 by GI.  Patient was subsequently considered stable for transfer out of the ICU.  Assessment and Plan:  Acute blood loss anemia Gastrointestinal hemorrhage with melena Patient received multiple units of transfusion including 6 units PRBC, 1 unit each of platelets, FFP, cryoprecipitate during hospitalization.  Underwent EGD without obvious upper GI source.  Continued to have bright red blood and melanotic stools show GI was consulted and patient underwent colonoscopy Evaluation 02/05/2023 with findings of ischemic colitis.  GI has recommended surgical consultation.  Patient has been seen by general surgery and recommend bowel rest liquid diet and IV Zosyn.  Lactate slightly elevated at 2.0.  Will continue with Ringer lactate infusion.  Latest hemoglobin of 9.0.  Is asymptomatic at this time.   Hypophosphatemia, phosphorus of 1.7.  Will continue to replenish orally.  Hypokalemia.  Improved after replacement.  Latest potassium of 3.9.  UTI Completed course of Rocephin on 02/02/2023   AKI on CKD - Concern for ATN due to hypovolemia and profound anemia.  Latest creatinine of 1.0.  Improved.  Monitor BMP.   Hypothyroidism Markedly elevated TSH and low T4.  Continue Synthroid, hydrocortisone    Acute urinary retention - On Foley catheter.  Will consider discontinuation of Foley catheter likely in 1 to 2  days.  Diabetes mellitus Continue sliding scale insulin, Accu-Cheks diabetic diet.  Continue to hold Farxiga   Coronary artery disease Imdur on hold.  No acute issues.   COPD Continue home umeclidinium-vilanterol.  Appears comfortable.   Acute metabolic encephalopathy - Secondary to blood loss and profound anemia.  Improved.  Avoid sedatives.   Chronic pain status post L4-5 decompression and fusion Patient is on  MS Contin and oxycodone at home.  Linzess on hold.  Has been started on Percocet at this time due to diffuse pain.  Avoiding diclofenac gel for now.   History of mood disorder, bipolar 1, ADHD On Adderall, doxepin, Rexulti, Trintellix at home., all currently held in setting of encephalopathy.  Continue Xanax.  Resume gradually.   Vitamin D deficiency: Continue vitamin D 50,000 units p.o. weekly, follow with PCP to repeat vitamin D level after 3 to 6 months.  Debility, deconditioning.  PT has recommended CIR at this time.     DVT prophylaxis: SCDs Start: 01/31/23 0307   Code Status:     Code Status: Full Code  Disposition: CIR as per PT recommendation.  Status is: Inpatient  Remains inpatient appropriate because: Need for CIR, pending clinical improvement, IV antibiotic,    Family Communication:  Spoke with the patient's husband on the phone on 02/04/2023  Consultants:  PCCM GI  Procedures:  EGD on 01/31/2023 Transfusion of PRBC and blood products Colonoscopy 02/05/2023  Antimicrobials:  Zosyn IV  Anti-infectives (From admission, onward)    Start     Dose/Rate Route Frequency Ordered Stop   02/05/23 1245  piperacillin-tazobactam (ZOSYN) IVPB 3.375 g        3.375  g 12.5 mL/hr over 240 Minutes Intravenous Every 8 hours 02/05/23 1158 02/10/23 1359   02/02/23 0400  metroNIDAZOLE (FLAGYL) IVPB 500 mg  Status:  Discontinued        500 mg 100 mL/hr over 60 Minutes Intravenous Every 12 hours 02/02/23 0231 02/04/23 1747   01/31/23 0000  cefTRIAXone (ROCEPHIN)  1 g in sodium chloride 0.9 % 100 mL IVPB  Status:  Discontinued        1 g 200 mL/hr over 30 Minutes Intravenous  Once 01/30/23 2352 01/30/23 2354   01/31/23 0000  cefTRIAXone (ROCEPHIN) 1 g in sodium chloride 0.9 % 100 mL IVPB        1 g 200 mL/hr over 30 Minutes Intravenous Every 24 hours 01/30/23 2354 02/02/23 0253       Subjective:  Today, patient was seen and examined at bedside.  Seen after colonoscopic evaluation.  Patient denies any nausea, vomiting, abdominal pain, fever or chills.   Objective: Vitals:   02/05/23 0941 02/05/23 0950 02/05/23 1000 02/05/23 1117  BP: (!) 154/95 (!) 156/96 (!) 159/99 (!) 138/91  Pulse: 97 93 91 (!) 121  Resp: 18 (!) 25 19 18   Temp: (!) 97.4 F (36.3 C)   98.7 F (37.1 C)  TempSrc: Temporal   Oral  SpO2: 95% 96% 96% 98%  Weight:      Height:        Intake/Output Summary (Last 24 hours) at 02/05/2023 1424 Last data filed at 02/05/2023 1354 Gross per 24 hour  Intake 1585.82 ml  Output 2100 ml  Net -514.18 ml   Filed Weights   01/31/23 0649 01/31/23 0938 02/05/23 0837  Weight: 103.1 kg 103.1 kg 97.5 kg    Physical Examination: Body mass index is 35.78 kg/m.   General: Obese built, not in obvious distress HENT: Mild pallor noted. Chest:  .  Diminished breath sounds bilaterally. No crackles or wheezes.  CVS: S1 &S2 heard. No murmur.  Regular rate and rhythm. Abdomen: Soft, nontender, nondistended.  Bowel sounds are heard.   Extremities: No cyanosis, clubbing or edema.  Peripheral pulses are palpable. Psych: Alert, awake and oriented,  CNS:  No cranial nerve deficits.  Power equal in all extremities.   Skin: Warm and dry.  No rashes noted.  Data Reviewed:   CBC: Recent Labs  Lab 01/30/23 1954 01/30/23 2305 02/01/23 1200 02/01/23 1800 02/02/23 0045 02/02/23 0327 02/02/23 0557 02/02/23 1141 02/03/23 0744 02/03/23 1638 02/04/23 0807 02/04/23 1749 02/05/23 0444  WBC 13.8*   < > 11.0* 10.7* 11.3*  --  13.0* 13.6*  --    --   --   --   --   NEUTROABS 11.4*  --   --   --   --   --   --   --   --   --   --   --   --   HGB 3.0*   < > 7.8* 6.8* 7.0*   < > 7.2* 8.1* 7.2* 6.6* 8.1* 8.8* 9.0*  HCT 10.0*   < > 23.0* 20.1* 20.8*   < > 21.9* 24.1* 21.4* 19.1* 23.9* 26.4* 27.3*  MCV 99.0   < > 88.8 87.4 88.1  --  88.7 89.9  --   --   --   --   --   PLT 350   < > 235 206 202  --  191 218  --   --   --   --   --    < > =  values in this interval not displayed.    Basic Metabolic Panel: Recent Labs  Lab 01/31/23 0406 01/31/23 1836 02/01/23 0525 02/02/23 0557 02/02/23 1141 02/03/23 0744 02/04/23 0807 02/05/23 0444  NA 135   < > 139 134*  --  135 136 136  K 3.8   < > 3.1* 3.4*  --  3.1* 3.1* 3.9  CL 102   < > 106 107  --  104 103 104  CO2 17*   < > 21* 19*  --  21* 21* 20*  GLUCOSE 97   < > 149* 113*  --  100* 97 96  BUN 72*   < > 48* 39*  --  25* 20 17  CREATININE 2.37*   < > 1.87* 1.44*  --  1.13* 1.14* 1.01*  CALCIUM 7.5*   < > 7.2* 7.1*  --  7.4* 7.5* 7.7*  MG 2.2  --   --   --  2.2 2.2 2.0 2.2  PHOS 6.0*  --   --   --  1.4* 2.2* 2.0* 1.7*   < > = values in this interval not displayed.    Liver Function Tests: Recent Labs  Lab 01/30/23 1933 01/31/23 0406 02/02/23 1141  AST 52* 63* 35  ALT 29 33 32  ALKPHOS 72 72 58  BILITOT 0.7 0.6 0.5  PROT 6.0* 5.6* 5.8*  ALBUMIN 2.9* 2.8* 2.3*     Radiology Studies: CT Angio Abd/Pel w/ and/or w/o  Result Date: 02/05/2023 CLINICAL DATA:  Abdominal pain, evaluate for mesenteric ischemia, ulcerated mucosa identified by colonoscopy EXAM: CTA ABDOMEN AND PELVIS WITHOUT AND WITH CONTRAST TECHNIQUE: Multidetector CT imaging of the abdomen and pelvis was performed using the standard protocol during bolus administration of intravenous contrast. Multiplanar reconstructed images and MIPs were obtained and reviewed to evaluate the vascular anatomy. RADIATION DOSE REDUCTION: This exam was performed according to the departmental dose-optimization program which includes  automated exposure control, adjustment of the mA and/or kV according to patient size and/or use of iterative reconstruction technique. CONTRAST:  75mL OMNIPAQUE IOHEXOL 350 MG/ML SOLN COMPARISON:  02/01/2023 FINDINGS: VASCULAR Normal contour and caliber of the abdominal aorta. No evidence of aneurysm, dissection, or other acute aortic pathology. Replaced right hepatic artery arising from the superior mesenteric artery. Solitary bilateral renal arteries. Aortic branch vessels are widely patent, with specific attention to the superior and inferior mesenteric arteries. Mild aortic atherosclerosis. Review of the MIP images confirms the above findings. NON-VASCULAR Lower Chest: Small bilateral pleural effusions and associated atelectasis or consolidation. Hepatobiliary: No solid liver abnormality is seen. No gallstones, gallbladder wall thickening, or biliary dilatation. Pancreas: Unremarkable. No pancreatic ductal dilatation or surrounding inflammatory changes. Spleen: Normal in size without significant abnormality. Adrenals/Urinary Tract: Unchanged, definitively benign macroscopic fat containing right adrenal adenoma, for which no further follow-up or characterization is required. Similarly benign, non nodular adenomatous thickening of the left adrenal gland, requiring no further follow-up or characterization. Kidneys are normal, without renal calculi, solid lesion, or hydronephrosis. Urinary bladder decompressed by Foley catheter. Stomach/Bowel: Status post Roux type gastric bypass. Severe, long segment wall thickening of the distal colon extending from the distal transverse colon through the rectum. Lymphatic: No enlarged abdominal or pelvic lymph nodes. Reproductive: Status post hysterectomy. Other: No abdominal wall hernia or abnormality. Small volume ascites throughout the abdomen and pelvis. Musculoskeletal: No acute osseous findings. IMPRESSION: 1. Normal contour and caliber of the abdominal aorta. No evidence  of aneurysm, dissection, or other acute aortic pathology. Aortic branch  vessels are widely patent, with specific attention to the superior and inferior mesenteric arteries. Mild aortic atherosclerosis 2. Severe, long segment wall thickening of the distal colon extending from the distal transverse colon through the rectum, consistent with colitis and similar to prior examination. 3. Small volume ascites, likely reactive. 4. Small bilateral pleural effusions and associated atelectasis or consolidation. 5. Status post Roux type gastric bypass. Aortic Atherosclerosis (ICD10-I70.0). Electronically Signed   By: Jearld Lesch M.D.   On: 02/05/2023 13:51      LOS: 5 days    Joycelyn Das, MD [facility] Available via Epic secure chat 7am-7pm After these hours, please refer to coverage provider listed on [email or web address] 02/05/2023, 2:24 PM

## 2023-02-05 NOTE — Progress Notes (Signed)
Pharmacy Antibiotic Note  Caroline Williams is a 62 y.o. female admitted on 01/30/2023 with ischemic colitis. Underwent colonoscopy today 8/24, patchy erosions strongly suspicious for ischmic colitis noted. WBC 13.6 (8/21), afebrile. Pharmacy has been consulted for Zosyn  dosing.   Plan: Zosyn 3.375 g IV every 8 hours Monitor WBC, temperature, and clinical picture  F/u LOT   Height: 5\' 5"  (165.1 cm) Weight: 97.5 kg (215 lb) IBW/kg (Calculated) : 57  Temp (24hrs), Avg:98.1 F (36.7 C), Min:97.4 F (36.3 C), Max:98.7 F (37.1 C)  Recent Labs  Lab 01/30/23 2014 01/30/23 2253 01/31/23 0059 01/31/23 0428 01/31/23 0607 02/01/23 0525 02/01/23 1200 02/01/23 1800 02/02/23 0045 02/02/23 0557 02/02/23 1141 02/03/23 0744 02/04/23 0807 02/05/23 0444 02/05/23 1137  WBC  --   --    < >  --    < > 11.1* 11.0* 10.7* 11.3* 13.0* 13.6*  --   --   --   --   CREATININE  --   --    < >  --    < > 1.87*  --   --   --  1.44*  --  1.13* 1.14* 1.01*  --   LATICACIDVEN 14.0* 11.2*  --  4.9*  --   --   --   --   --   --   --   --   --   --  2.0*   < > = values in this interval not displayed.    Estimated Creatinine Clearance: 66.7 mL/min (A) (by C-G formula based on SCr of 1.01 mg/dL (H)).    Allergies  Allergen Reactions   Aspirin Other (See Comments), Shortness Of Breath and Anaphylaxis    Pt states she has had Toradol several times without any reactions  Other Reaction(s): Other (See Comments), respiratory distress   Bee Venom Anaphylaxis   Sulfa Antibiotics Anaphylaxis   Evolocumab     Other Reaction(s): Unknown   Nsaids Other (See Comments)    History of gastric bypass   Other Other (See Comments)    No blood products.  Patient did  Request that only albumin or albumin-containing products may be administered    Antimicrobials this admission: 8/24 Zosyn >>  8/21 Metronidazole >> 8/23  Dose adjustments this admission:   Microbiology results: 8/19 BCx: ngtd 8/18 UCx: E. Coli  (resistant to amp, gent, and bactrim)    Thank you for allowing pharmacy to be a part of this patient's care.  Griffin Dakin 02/05/2023 2:11 PM

## 2023-02-05 NOTE — Plan of Care (Signed)
Patient awake for most of the night having bowel movements. Patient completed bowel prep at midnight. Having multiple loose, dark stools. Vital signs remain stable. Patient aware of plan for colonoscopy in AM. Will continue to monitor closely.    Problem: Activity: Goal: Will remain free from falls Outcome: Progressing   Problem: Bowel/Gastric: Goal: Gastrointestinal status for postoperative course will improve Outcome: Progressing   Problem: Clinical Measurements: Goal: Ability to maintain clinical measurements within normal limits will improve Outcome: Progressing   Problem: Pain Management: Goal: Pain level will decrease Outcome: Progressing

## 2023-02-05 NOTE — Consult Note (Signed)
Caroline Williams  1961-03-05 [id number]  CARE TEAM:  PCP: Hoy Register, MD  Outpatient Care Team: Patient Care Team: Hoy Register, MD as PCP - General (Family Medicine) Orbie Pyo, MD as PCP - Cardiology (Cardiology) Tressie Stalker, MD as Consulting Physician (Neurosurgery) Bradly Bienenstock, MD as Consulting Physician (Orthopedic Surgery) Ander Purpura, OD (Optometry) Kennon Rounds as Physician Assistant (Cardiology)  Inpatient Treatment Team: Treatment Team:  Joycelyn Das, MD Willis Modena, MD Jeronimo Greaves, CCC-SLP Sibyl Parr, MD Efraim Kaufmann, RN Leilani Merl, RN Gretchen Short, NT Dobson, Camie Patience, LCSW [place], [place], [facility] [place], [place], [place] Deveron Furlong, RN Swist, Debbie, RN [facility], Md, MD   This patient is a 62 y.o.female who presents today for surgical evaluation at the request of Dr Tyson Babinski.   Chief complaint / Reason for evaluation: Ischemic colon with melena  62 year old woman with numerous medical issues.  History of gastric bypass 2016.  I had abdominal pain with nausea and vomiting and loose bowel wounds.  Dark melanotic stools.  Hgb 3.  Admitted 8/18.  Gastroenterology and critical care consulted.  Hypotensive.  Normally refuses blood products but ended up getting transfused.  Placed on antibiotics for the first 3 days.  Some repeated bleeding.  Upper endoscopy disproved any major source of bleeding at gastrojejunostomy or prior gastric bypass.  She had to bleeding scans without any obvious source.  Colon thickened and irritated suspicious for colitis.  Had recurrent bleeding.  Underwent colonoscopy today.  Patchy erosions strongly suspicious for ischemic colitis noted.  Areas biopsied.  Given concern colitis surgical consultation requested.  Husband in room.  Patient claims she usually moves her bowels once or twice a day without any constipation or diarrhea.  No history of prior ulcerations that she is aware  of.  Does have diabetes and hypothyroidism.  Some hypercholesterolemia and coronary disease.  Some mild COPD on inhalers.  Is on chronic narcotics for lumbar chronic back pain.  Occasionally uses Linzess to compensate   Assessment  Caroline Williams  62 y.o. female  Day of Surgery  Procedure(s): COLONOSCOPY WITH PROPOFOL BIOPSY  Problem List:  Principal Problem:   Acute ischemic colitis [name]) Active Problems:   Morbid obesity (HCC)   Diabetes mellitus with neuropathy (HCC)   History of Roux-en-Y gastric bypass 2016   Hypothyroidism   COPD mixed type (HCC)   Hyperlipidemia   Essential (primary) hypertension   Other chronic pain   CAD (coronary artery disease)   UTI (urinary tract infection)   CKD (chronic kidney disease) stage 3, GFR 30-59 ml/min (HCC)   AKI (acute kidney injury) (HCC)   Acute metabolic encephalopathy   ABLA (acute blood loss anemia)   Gastrointestinal hemorrhage with melena   Hypokalemia   Acute GI bleeding   Melena and GI bleeding with ischemic colitis most likely etiology  Plan:  Bowel rest.  I would do liquids only for now.  IV antibiotics.  I would do piperacillin/tazobactam for the next 5 days.  She has no guarding or peritonitis.  I do not see any reason for repeating a CT scan at this time.  Can reassess if she does not improve clinically.  If she markedly deteriorates with peritonitis free air or recurrent GI bleeding requiring more than 6 units total, may need abdominal colectomy in ileostomy.  Would like to hold off.  Patient wishes to hold off as well.  There is no indication for emergent surgery today.  Surgery  will follow.  Agree with her correcting electrolytes and following potassium and phosphorus.  History of gastric bypass.  Some irritation at GJ anastomosis.  Reasonable to do PPIs for now.  Renal failure/AKI seems to be resolving.  Continue hydrating.  -VTE prophylaxis- SCDs, etc -mobilize as tolerated to help recovery  I  reviewed nursing notes, ED provider notes, Consultant GI notes, hospitalist notes, last 24 h vitals and pain scores, last 48 h intake and output, last 24 h labs and trends, and last 24 h imaging results. I have reviewed this patient's available data, including medical history, events of note, test results, etc as part of my evaluation.  A significant portion of that time was spent in counseling.  Care during the described time interval was provided by me.  This care required moderate level of medical decision making.  02/05/2023  Ardeth Sportsman, MD, FACS, MASCRS Esophageal, Gastrointestinal & Colorectal Surgery Robotic and Minimally Invasive Surgery  Central  [facility] [address], [address] [place], [place] [address] [phone] Fax [phone] Main  CONTACT INFORMATION: Weekday (9AM-5PM): Call [facility] main office at [phone] Weeknight (5PM-9AM) or Weekend/Holiday: Check EPIC "Web Links" tab & use "AMION" (password " [email or web address]") for General Surgery [facility] coverage  Please, DO NOT use SecureChat  (it is not reliable communication to reach operating surgeons & will lead to a delay in care).   Epic staff messaging available for outptient concerns needing 1-2 business day response.      02/05/2023      Past Medical History:  Diagnosis Date   Anxiety    Arthritis    Asthma    Bipolar 1 disorder (HCC)    Breast discharge    Breast lump    Breast pain    CAD (coronary artery disease) 12/03/2022    o Cath 2008: no CAD   o CCTA 10/05/19: CAC score 49, 88th percentile, nonobstructive CAD (LM 1-24 mid and dist)  o Myoview 12/31/20: EF 45, no ischemia or infarction, low risk    Chronic pain    on MS Contin and oxycodone   COPD (chronic obstructive pulmonary disease) (HCC)    Depression    Diabetes mellitus    diet and exercise controlled   Dysrhythmia    HR in 30s on metoprolol   Escherichia coli (E. coli) infection    Fever    GERD  (gastroesophageal reflux disease)    History of chest pain    History of knee replacement, total    Hypertension    Hypothyroidism    N&V (nausea and vomiting)    Peripheral neuropathy    Right foot no sensation   Sciatica    Sleep apnea    does not use CPAP   Thyroid disease    Wears glasses     Past Surgical History:  Procedure Laterality Date   ABDOMINAL HYSTERECTOMY     partial   APPENDECTOMY     BIOPSY  03/14/2018   Procedure: BIOPSY;  Surgeon: Charlott Rakes, MD;  Location: [facility];  Service: Endoscopy;;   BREAST DUCTAL SYSTEM EXCISION Right 08/31/2017   Procedure: RIGHT BREAST CENTRAL DUCT EXCISION;  Surgeon: Harriette Bouillon, MD;  Location: [facility];  Service: General;  Laterality: Right;   BREAST EXCISIONAL BIOPSY Right    BREAST LUMPECTOMY     right   CARDIAC CATHETERIZATION     no significant CAD, nl LV function by 11/08/06 cath   CESAREAN SECTION  x4   COLONOSCOPY WITH PROPOFOL N/A 03/14/2018   Procedure: COLONOSCOPY WITH PROPOFOL;  Surgeon: Charlott Rakes, MD;  Location: [facility];  Service: Endoscopy;  Laterality: N/A;   ESOPHAGOGASTRODUODENOSCOPY (EGD) WITH PROPOFOL Left 01/31/2023   Procedure: ESOPHAGOGASTRODUODENOSCOPY (EGD) WITH PROPOFOL;  Surgeon: Willis Modena, MD;  Location: [facility];  Service: Gastroenterology;  Laterality: Left;   GASTRIC ROUX-EN-Y N/A 11/25/2014   Procedure: LAPAROSCOPIC ROUX-EN-Y GASTRIC BYPASS WITH UPPER ENDOSCOPY;  Surgeon: Luretha Murphy, MD;  Location: [facility];  Service: General;  Laterality: N/A;   HERNIA REPAIR     left inguinal   JOINT REPLACEMENT     KNEE SURGERY     right knee   MOUTH SURGERY     MYOMECTOMY ABDOMINAL APPROACH  1989   POLYPECTOMY  03/14/2018   Procedure: POLYPECTOMY;  Surgeon: Charlott Rakes, MD;  Location: [facility];  Service: Endoscopy;;   TOTAL KNEE REVISION  06/21/2012   Procedure: TOTAL KNEE REVISION;  Surgeon: Nadara Mustard, MD;  Location: [facility];  Service:  Orthopedics;  Laterality: Right;  Revision Right Total Knee Arthroplasty    Social History   Socioeconomic History   Marital status: Single    Spouse name: Not on file   Number of children: Not on file   Years of education: Not on file   Highest education level: Not on file  Occupational History   Not on file  Tobacco Use   Smoking status: Former    Current packs/day: 0.25    Average packs/day: 0.3 packs/day for 44.1 years (11.0 ttl pk-yrs)    Types: Cigarettes    Start date: 3    Quit date: 2020   Smokeless tobacco: Never  Vaping Use   Vaping status: Never Used  Substance and Sexual Activity   Alcohol use: Not Currently    Comment: social   Drug use: No   Sexual activity: Yes    Birth control/protection: Surgical  Other Topics Concern   Not on file  Social History Narrative   Not on file   Social Determinants of Health   Financial Resource Strain: Medium Risk (11/03/2022)   Overall Financial Resource Strain (CARDIA)    Difficulty of Paying Living Expenses: Somewhat hard  Food Insecurity: No Food Insecurity (02/04/2023)   Hunger Vital Sign    Worried About Running Out of Food in the Last Year: Never true    Ran Out of Food in the Last Year: Never true  Transportation Needs: Unmet Transportation Needs (02/04/2023)   PRAPARE - Transportation    Lack of Transportation (Medical): Yes    Lack of Transportation (Non-Medical): Yes  Physical Activity: Inactive (11/03/2022)   Exercise Vital Sign    Days of Exercise per Week: 0 days    Minutes of Exercise per Session: 0 min  Stress: Stress Concern Present (11/03/2022)   [employer] of Occupational Health - Occupational Stress Questionnaire    Feeling of Stress : To some extent  Social Connections: Moderately Integrated (11/03/2022)   Social Connection and Isolation Panel [NHANES]    Frequency of Communication with Friends and Family: More than three times a week    Frequency of Social Gatherings with Friends and  Family: Once a week    Attends Religious Services: 1 to 4 times per year    Active Member of [employer] or Organizations: No    Attends Banker Meetings: Never    Marital Status: Living with partner  Intimate Partner Violence: Not At Risk (02/04/2023)   Humiliation, Afraid,  Rape, and Kick questionnaire    Fear of Current or Ex-Partner: No    Emotionally Abused: No    Physically Abused: No    Sexually Abused: No    Family History  Problem Relation Age of Onset   Cancer Father        colon   Stroke Father    Heart disease Father    Diabetes Father    Hypertension Father    Depression Sister    Hypertension Mother    Breast cancer Neg Hx     Current Facility-Administered Medications  Medication Dose Route Frequency Provider Last Rate Last Admin   acetaminophen (TYLENOL) tablet 650 mg  650 mg Oral Q6H PRN Charlott Rakes, MD   650 mg at 02/03/23 2023   Or   acetaminophen (TYLENOL) suppository 650 mg  650 mg Rectal Q6H PRN Charlott Rakes, MD       albuterol (PROVENTIL) (2.5 MG/3ML) 0.083% nebulizer solution 2.5 mg  2.5 mg Nebulization Q2H PRN Charlott Rakes, MD       ALPRAZolam Prudy Feeler) tablet 1 mg  1 mg Oral TID PRN Charlott Rakes, MD   1 mg at 02/04/23 2309   alum & mag hydroxide-simeth (MAALOX/MYLANTA) 200-200-20 MG/5ML suspension 30 mL  30 mL Oral Q6H PRN Karie Soda, MD       bisacodyl (DULCOLAX) suppository 10 mg  10 mg Rectal Once Charlott Rakes, MD       Chlorhexidine Gluconate Cloth 2 % PADS 6 each  6 each Topical Q0600 Charlott Rakes, MD   6 each at 02/05/23 0630   dextrose 5 % bolus 250 mL  250 mL Intravenous PRN Charlott Rakes, MD       feeding supplement (BOOST / RESOURCE BREEZE) liquid 1 Container  1 Container Oral TID BM Charlott Rakes, MD   1 Container at 02/04/23 1136   feeding supplement (ENSURE PRE-SURGERY) liquid 296 mL  296 mL Oral Once Karie Soda, MD       hydrALAZINE (APRESOLINE) injection 10 mg  10 mg Intravenous Q6H PRN  Charlott Rakes, MD   10 mg at 02/04/23 1837   insulin aspart (novoLOG) injection 0-9 Units  0-9 Units Subcutaneous Q4H Charlott Rakes, MD   1 Units at 02/04/23 1715   lactated ringers bolus 1,000 mL  1,000 mL Intravenous Q8H PRN Karie Soda, MD       lactated ringers infusion   Intravenous Continuous Charlott Rakes, MD   Stopped at 02/05/23 0956   levothyroxine (SYNTHROID) tablet 150 mcg  150 mcg Oral Q0600 Charlott Rakes, MD   150 mcg at 02/05/23 0515   magic mouthwash  15 mL Oral QID PRN Karie Soda, MD       menthol-cetylpyridinium (CEPACOL) lozenge 3 mg  1 lozenge Oral PRN Karie Soda, MD       methocarbamol (ROBAXIN) 1,000 mg in dextrose 5 % 100 mL IVPB  1,000 mg Intravenous Q6H PRN Karie Soda, MD       methocarbamol (ROBAXIN) tablet 1,000 mg  1,000 mg Oral Q6H PRN Karie Soda, MD       multivitamin with minerals tablet 1 tablet  1 tablet Oral BID Charlott Rakes, MD   1 tablet at 02/05/23 1136   naloxone (NARCAN) injection 0.4 mg  0.4 mg Intravenous PRN Charlott Rakes, MD   0.4 mg at 01/31/23 0102   naphazoline-glycerin (CLEAR EYES REDNESS) ophth solution 1-2 drop  1-2 drop Both Eyes QID PRN Karie Soda, MD       nicotine (NICODERM  CQ - dosed in mg/24 hr) patch 7 mg  7 mg Transdermal Daily Charlott Rakes, MD   7 mg at 02/05/23 1137   ondansetron (ZOFRAN) injection 4 mg  4 mg Intravenous Q6H PRN Karie Soda, MD       Or   ondansetron (ZOFRAN) 8 mg in sodium chloride 0.9 % 50 mL IVPB  8 mg Intravenous Q6H PRN Karie Soda, MD       ondansetron [facility]) tablet 4 mg  4 mg Oral Q6H PRN Charlott Rakes, MD       oxyCODONE-acetaminophen (PERCOCET/ROXICET) 5-325 MG per tablet 1 tablet  1 tablet Oral Q8H PRN Charlott Rakes, MD   1 tablet at 02/04/23 2308   pantoprazole (PROTONIX) EC tablet 40 mg  40 mg Oral BID Charlott Rakes, MD   40 mg at 02/05/23 1136   phenol (CHLORASEPTIC) mouth spray 2 spray  2 spray Mouth/Throat PRN Karie Soda, MD        piperacillin-tazobactam (ZOSYN) IVPB 3.375 g  3.375 g Intravenous Trixie Deis, MD       polycarbophil (FIBERCON) tablet 625 mg  625 mg Oral BID Karie Soda, MD       potassium PHOSPHATE 20 mmol in dextrose 5 % 500 mL infusion  20 mmol Intravenous Once Charlott Rakes, MD 84 mL/hr at 02/05/23 1141 20 mmol at 02/05/23 1141   prochlorperazine (COMPAZINE) injection 5-10 mg  5-10 mg Intravenous Q4H PRN Karie Soda, MD       simethicone (MYLICON) 40 MG/0.6ML suspension 80 mg  80 mg Oral QID PRN Karie Soda, MD       simethicone (MYLICON) chewable tablet 80 mg  80 mg Oral QID Karie Soda, MD       sodium chloride (OCEAN) 0.65 % nasal spray 1-2 spray  1-2 spray Each Nare Q6H PRN Karie Soda, MD       umeclidinium-vilanterol (ANORO ELLIPTA) 62.5-25 MCG/ACT 1 puff  1 puff Inhalation Daily Charlott Rakes, MD   1 puff at 02/04/23 0175   Vitamin D (Ergocalciferol) (DRISDOL) 1.25 MG (50000 UNIT) capsule 50,000 Units  50,000 Units Oral Q7 days Charlott Rakes, MD   50,000 Units at 02/02/23 1635   white petrolatum (VASELINE) gel   Topical PRN Charlott Rakes, MD         Allergies  Allergen Reactions   Aspirin Other (See Comments), Shortness Of Breath and Anaphylaxis    Pt states she has had Toradol several times without any reactions  Other Reaction(s): Other (See Comments), respiratory distress   Bee Venom Anaphylaxis   Sulfa Antibiotics Anaphylaxis   Evolocumab     Other Reaction(s): Unknown   Nsaids Other (See Comments)    History of gastric bypass   Other Other (See Comments)    No blood products.  Patient did  Request that only albumin or albumin-containing products may be administered    ROS:   All other systems reviewed & are negative except per HPI or as noted below: Constitutional:  No fevers, chills, sweats.  Weight stable Eyes:  No vision changes, No discharge HENT:  No sore throats, nasal drainage Lymph: No neck swelling, No bruising easily Pulmonary:  No cough,  productive sputum CV: No orthopnea, PND  Patient walks 20 minutes without difficulty.  No exertional chest/neck/shoulder/arm pain.  GI: History of gastric bypass 2016.  No personal nor family history of GI/colon cancer, inflammatory bowel disease, irritable bowel syndrome, allergy such as Celiac Sprue, dietary/dairy problems, colitis, ulcers nor gastritis.  No recent sick  contacts/gastroenteritis.  No travel outside the country.  No changes in diet.  Renal: No UTIs, No hematuria Genital:  No drainage, bleeding, masses Musculoskeletal: No severe joint pain.  Good ROM major joints Skin:  No sores or lesions Heme/Lymph:  No easy bleeding.  No swollen lymph nodes   BP (!) 138/91 (BP Location: Left Arm)   Pulse (!) 121   Temp (!) 97.4 F (36.3 C) (Temporal)   Resp 18   Ht 5\' 5"  (1.651 m)   Wt 97.5 kg   SpO2 98%   BMI 35.78 kg/m   Physical Exam:  Constitutional: Not cachectic.  Hygeine adequate.  Vitals signs as above.   Eyes: Pupils reactive, normal extraocular movements. Sclera nonicteric Neuro: CN II-XII intact.  No major focal sensory defects.  No major motor deficits. Lymph: No head/neck/groin lymphadenopathy Psych:  No severe agitation.  No severe anxiety.  Judgment & insight Adequate, , little groggy but otherwise Oriented x4 HENT: Normocephalic, Mucus membranes moist.  No thrush.   Neck: Supple, No tracheal deviation.  No obvious thyromegaly Chest: No pain to chest wall compression.  Good respiratory excursion.  No audible wheezing CV:  Pulses intact.  regular rhythm.  No major extremity edema  Abdomen:  Obese Hernia: Not present. Diastasis recti: Mild supraumbilical midline. Soft.   Moderately distended.  Nontender.  No hepatomegaly.  No splenomegaly  Gen:  Inguinal hernia: Not present.  Inguinal lymph nodes: without lymphadenopathy.    Rectal: (Deferred)  Ext: No obvious deformity or contracture.  Edema: Not present.  No cyanosis Skin: No major subcutaneous nodules.   Warm and dry Musculoskeletal: Severe joint rigidity not present.  No obvious clubbing.  No digital petechiae.     Results:   Labs: Results for orders placed or performed during the hospital encounter of 01/30/23 (from the past 48 hour(s))  Hemoglobin and hematocrit, blood     Status: Abnormal   Collection Time: 02/03/23  4:38 PM  Result Value Ref Range   Hemoglobin 6.6 (LL) 12.0 - 15.0 g/dL    Comment: CRITICAL VALUE NOTED.  VALUE IS CONSISTENT WITH PREVIOUSLY REPORTED AND CALLED VALUE. REPEATED TO VERIFY    HCT 19.1 (L) 36.0 - 46.0 %    Comment: Performed at [facility], [address] [place], [place] [address]  Glucose, capillary     Status: Abnormal   Collection Time: 02/03/23  5:00 PM  Result Value Ref Range   Glucose-Capillary 143 (H) 70 - 99 mg/dL    Comment: Glucose reference range applies only to samples taken after fasting for at least 8 hours.  Type and screen [facility]     Status: None   Collection Time: 02/03/23  7:34 PM  Result Value Ref Range   ABO/RH(D) AB POS    Antibody Screen NEG    Sample Expiration 02/06/2023,2359    Unit Number [id number]    Blood Component Type RBC LR PHER2    Unit division 00    Status of Unit ISSUED,FINAL    Transfusion Status OK TO TRANSFUSE    Crossmatch Result      Compatible Performed at [facility], [address], [place], [place] [address]   Prepare RBC (crossmatch)     Status: None   Collection Time: 02/03/23  7:36 PM  Result Value Ref Range   Order Confirmation      ORDER PROCESSED BY BLOOD BANK Performed at [facility], [address], [place], [place]  [address]   Glucose, capillary     Status: Abnormal   Collection Time: 02/03/23  7:49 PM  Result Value Ref Range   Glucose-Capillary 108 (H) 70 - 99 mg/dL    Comment: Glucose reference range applies only to samples taken after fasting for at least 8 hours.  Glucose, capillary     Status: Abnormal   Collection Time: 02/04/23  12:22 AM  Result Value Ref Range   Glucose-Capillary 109 (H) 70 - 99 mg/dL    Comment: Glucose reference range applies only to samples taken after fasting for at least 8 hours.  Glucose, capillary     Status: None   Collection Time: 02/04/23  5:31 AM  Result Value Ref Range   Glucose-Capillary 94 70 - 99 mg/dL    Comment: Glucose reference range applies only to samples taken after fasting for at least 8 hours.  Basic metabolic panel     Status: Abnormal   Collection Time: 02/04/23  8:07 AM  Result Value Ref Range   Sodium 136 135 - 145 mmol/L   Potassium 3.1 (L) 3.5 - 5.1 mmol/L   Chloride 103 98 - 111 mmol/L   CO2 21 (L) 22 - 32 mmol/L   Glucose, Bld 97 70 - 99 mg/dL    Comment: Glucose reference range applies only to samples taken after fasting for at least 8 hours.   BUN 20 8 - 23 mg/dL   Creatinine, Ser 6.04 (H) 0.44 - 1.00 mg/dL   Calcium 7.5 (L) 8.9 - 10.3 mg/dL   GFR, Estimated 54 (L) >60 mL/min    Comment: (NOTE) Calculated using the CKD-EPI Creatinine Equation (2021)    Anion gap 12 5 - 15    Comment: Performed at [facility], [address], [place], [place] [address]  Magnesium     Status: None   Collection Time: 02/04/23  8:07 AM  Result Value Ref Range   Magnesium 2.0 1.7 - 2.4 mg/dL    Comment: Performed at [facility], [address], [place], [place] [address]  Phosphorus     Status: Abnormal   Collection Time: 02/04/23  8:07 AM  Result Value Ref Range   Phosphorus 2.0 (L) 2.5 - 4.6 mg/dL    Comment: Performed at [facility], [address] [place], [place] [address]  Hemoglobin and hematocrit, blood     Status: Abnormal   Collection Time: 02/04/23  8:07 AM  Result Value Ref Range   Hemoglobin 8.1 (L) 12.0 - 15.0 g/dL   HCT 62.1 (L) 30.8 - 65.7 %    Comment: Performed at [facility], [address], [place], [place] [address]  Glucose, capillary     Status: None   Collection Time: 02/04/23  8:47 AM  Result Value Ref Range    Glucose-Capillary 95 70 - 99 mg/dL    Comment: Glucose reference range applies only to samples taken after fasting for at least 8 hours.  Glucose, capillary     Status: Abnormal   Collection Time: 02/04/23 12:42 PM  Result Value Ref Range   Glucose-Capillary 120 (H) 70 - 99 mg/dL    Comment: Glucose reference range applies only to samples taken after fasting for at least 8 hours.  Glucose, capillary     Status: Abnormal   Collection Time: 02/04/23  4:16 PM  Result Value Ref Range   Glucose-Capillary 141 (H) 70 - 99 mg/dL    Comment: Glucose reference range applies only  to samples taken after fasting for at least 8 hours.  Hemoglobin and hematocrit, blood     Status: Abnormal   Collection Time: 02/04/23  5:49 PM  Result Value Ref Range   Hemoglobin 8.8 (L) 12.0 - 15.0 g/dL   HCT 46.9 (L) 62.9 - 52.8 %    Comment: Performed at [facility], [address] [place], [place] [address]  Glucose, capillary     Status: Abnormal   Collection Time: 02/04/23  9:25 PM  Result Value Ref Range   Glucose-Capillary 116 (H) 70 - 99 mg/dL    Comment: Glucose reference range applies only to samples taken after fasting for at least 8 hours.  Glucose, capillary     Status: None   Collection Time: 02/05/23 12:38 AM  Result Value Ref Range   Glucose-Capillary 78 70 - 99 mg/dL    Comment: Glucose reference range applies only to samples taken after fasting for at least 8 hours.  Glucose, capillary     Status: None   Collection Time: 02/05/23  4:14 AM  Result Value Ref Range   Glucose-Capillary 71 70 - 99 mg/dL    Comment: Glucose reference range applies only to samples taken after fasting for at least 8 hours.  Basic metabolic panel     Status: Abnormal   Collection Time: 02/05/23  4:44 AM  Result Value Ref Range   Sodium 136 135 - 145 mmol/L   Potassium 3.9 3.5 - 5.1 mmol/L    Comment: HEMOLYSIS AT THIS LEVEL MAY AFFECT RESULT   Chloride 104 98 - 111 mmol/L   CO2 20 (L) 22 - 32 mmol/L    Glucose, Bld 96 70 - 99 mg/dL    Comment: Glucose reference range applies only to samples taken after fasting for at least 8 hours.   BUN 17 8 - 23 mg/dL   Creatinine, Ser 4.01 (H) 0.44 - 1.00 mg/dL   Calcium 7.7 (L) 8.9 - 10.3 mg/dL   GFR, Estimated >02 >72 mL/min    Comment: (NOTE) Calculated using the CKD-EPI Creatinine Equation (2021)    Anion gap 12 5 - 15    Comment: Performed at [facility], [address], [place], [place] [address]  Magnesium     Status: None   Collection Time: 02/05/23  4:44 AM  Result Value Ref Range   Magnesium 2.2 1.7 - 2.4 mg/dL    Comment: Performed at [facility], [address] [place], [place] [address]  Phosphorus     Status: Abnormal   Collection Time: 02/05/23  4:44 AM  Result Value Ref Range   Phosphorus 1.7 (L) 2.5 - 4.6 mg/dL    Comment: Performed at [facility], [address], [place], [place] [address]  Hemoglobin and hematocrit, blood     Status: Abnormal   Collection Time: 02/05/23  4:44 AM  Result Value Ref Range   Hemoglobin 9.0 (L) 12.0 - 15.0 g/dL   HCT 63.8 (L) 75.6 - 43.3 %    Comment: Performed at [facility], [address] [place], [place] [address]  Glucose, capillary     Status: None   Collection Time: 02/05/23  7:42 AM  Result Value Ref Range   Glucose-Capillary 80 70 - 99 mg/dL    Comment: Glucose reference range applies only to samples taken after fasting for at least 8 hours.   Comment 1 Document in Chart   Glucose, capillary  Status: Abnormal   Collection Time: 02/05/23  8:50 AM  Result Value Ref Range   Glucose-Capillary 68 (L) 70 - 99 mg/dL    Comment: Glucose reference range applies only to samples taken after fasting for at least 8 hours.  Glucose, capillary     Status: Abnormal   Collection Time: 02/05/23  9:21 AM  Result Value Ref Range   Glucose-Capillary 65 (L) 70 - 99 mg/dL    Comment: Glucose reference range applies only to samples taken after fasting for at least 8  hours.  Glucose, capillary     Status: None   Collection Time: 02/05/23  9:40 AM  Result Value Ref Range   Glucose-Capillary 82 70 - 99 mg/dL    Comment: Glucose reference range applies only to samples taken after fasting for at least 8 hours.  Glucose, capillary     Status: None   Collection Time: 02/05/23 11:12 AM  Result Value Ref Range   Glucose-Capillary 90 70 - 99 mg/dL    Comment: Glucose reference range applies only to samples taken after fasting for at least 8 hours.    Imaging / Studies: DG Abd 1 View  Result Date: 02/02/2023 CLINICAL DATA:  Abdominal pain, small-bowel obstruction. EXAM: ABDOMEN - 1 VIEW COMPARISON:  February 01, 2023. FINDINGS: No abnormal bowel dilatation. No radio-opaque calculi or other significant radiographic abnormality are seen. IMPRESSION: No abnormal bowel dilatation. Electronically Signed   By: Lupita Raider M.D.   On: 02/02/2023 11:28   CT ANGIO GI BLEED  Result Date: 02/01/2023 CLINICAL DATA:  Lower GI bleeding.  History of bariatric surgery. EXAM: CTA ABDOMEN AND PELVIS WITHOUT AND WITH CONTRAST TECHNIQUE: Multidetector CT imaging of the abdomen and pelvis was performed using the standard protocol during bolus administration of intravenous contrast. Multiplanar reconstructed images and MIPs were obtained and reviewed to evaluate the vascular anatomy. RADIATION DOSE REDUCTION: This exam was performed according to the departmental dose-optimization program which includes automated exposure control, adjustment of the mA and/or kV according to patient size and/or use of iterative reconstruction technique. CONTRAST:  65mL OMNIPAQUE IOHEXOL 350 MG/ML SOLN COMPARISON:  CTA GI bleed study 01/30/2023 FINDINGS: VASCULAR Aorta: Normal caliber aorta without aneurysm, dissection, vasculitis or significant stenosis. Celiac: Patent without evidence of aneurysm, dissection, vasculitis or significant stenosis. SMA: Patent without evidence of aneurysm, dissection,  vasculitis or significant stenosis. Renals: Both renal arteries are patent without evidence of aneurysm, dissection, vasculitis, fibromuscular dysplasia or significant stenosis. IMA: Patent without evidence of aneurysm, dissection, vasculitis or significant stenosis. Inflow: Patent without evidence of aneurysm, dissection, vasculitis or significant stenosis. Proximal Outflow: Bilateral common femoral and visualized portions of the superficial and profunda femoral arteries are patent without evidence of aneurysm, dissection, vasculitis or significant stenosis. Veins: No obvious venous abnormality within the limitations of this arterial phase study. Review of the MIP images confirms the above findings. NON-VASCULAR Lower chest: There are trace bilateral pleural effusions. Hepatobiliary: Hyperdensity throughout the gallbladder is likely related to excreted contrast. Gallbladder sludge is not excluded. No biliary ductal dilatation. There is a 2.2 cm cyst in the liver. No new liver lesions are seen. Pancreas: Unremarkable. No pancreatic ductal dilatation or surrounding inflammatory changes. Spleen: Normal in size without focal abnormality. Adrenals/Urinary Tract: There is a low-density right adrenal nodule measuring to 2.3 cm compatible with adenoma. The left adrenal gland is within normal limits. There is a 16 mm cyst in the left kidney. There is no hydronephrosis or perinephric fluid. The bladder is decompressed  by Foley catheter. Stomach/Bowel: Examination is limited to assess for colonic bleeding secondary to retained hyperdense contrast. No definite acute hemorrhage identified within the bowel. There is diffuse colonic wall thickening most significant from the level of the splenic flexure to the level the rectum with mild surrounding inflammation compatible with colitis. The appendix is not seen. Patient is status post gastric bypass surgery. The stomach is nondilated. Jejunal loops in the left abdomen are now/newly  dilated measuring up to 4.6 cm definitive transition point is not visualized, but there are nondilated small bowel loops distally. Lymphatic: No enlarged lymph nodes. Reproductive: Status post hysterectomy. No adnexal masses. Other: No abdominal wall hernia or abnormality. No abdominopelvic ascites. Musculoskeletal: Posterior fusion changes are seen at L4-L5. Degenerative changes affect the spine. IMPRESSION: VASCULAR 1. No evidence for aortic dissection or aneurysm. 2. No acute gastrointestinal hemorrhage identified. Evaluation of the colon is limited secondary to retained hyperdense material. NON-VASCULAR 1. Diffuse colonic wall thickening compatible with colitis. 2. Dilated jejunal loops in the left abdomen concerning for small bowel obstruction. No definitive transition point is visualized. 3. Trace bilateral pleural effusions. 4. Right adrenal adenoma. Electronically Signed   By: Darliss Cheney M.D.   On: 02/01/2023 21:49   CT ANGIO GI BLEED  Result Date: 01/30/2023 CLINICAL DATA:  Bloody diarrhea. Concern for acute GI bleed or mesenteric ischemia. EXAM: CTA ABDOMEN AND PELVIS WITHOUT AND WITH CONTRAST TECHNIQUE: Multidetector CT imaging of the abdomen and pelvis was performed using the standard protocol during bolus administration of intravenous contrast. Multiplanar reconstructed images and MIPs were obtained and reviewed to evaluate the vascular anatomy. RADIATION DOSE REDUCTION: This exam was performed according to the departmental dose-optimization program which includes automated exposure control, adjustment of the mA and/or kV according to patient size and/or use of iterative reconstruction technique. CONTRAST:  75mL OMNIPAQUE IOHEXOL 350 MG/ML SOLN COMPARISON:  09/10/2021 FINDINGS: VASCULAR Aorta: Normal caliber aorta without aneurysm, dissection, vasculitis or significant stenosis. Scattered aortic calcifications. Celiac: Patent without evidence of aneurysm, dissection, vasculitis or significant  stenosis. SMA: Patent without evidence of aneurysm, dissection, vasculitis or significant stenosis. Renals: Both renal arteries are patent without evidence of aneurysm, dissection, vasculitis, fibromuscular dysplasia or significant stenosis. IMA: Patent without evidence of aneurysm, dissection, vasculitis or significant stenosis. Inflow: Atherosclerotic calcifications. No aneurysm or dissection. No stenosis. Proximal Outflow: Bilateral common femoral and visualized portions of the superficial and profunda femoral arteries are patent without evidence of aneurysm, dissection, vasculitis or significant stenosis. Veins: No obvious venous abnormality within the limitations of this arterial phase study. Review of the MIP images confirms the above findings. NON-VASCULAR Lower chest: No acute findings Hepatobiliary: Stable left hepatic cyst which appears benign. No suspicious focal hepatic abnormality. Gallbladder unremarkable. Pancreas: No focal abnormality or ductal dilatation. Spleen: No focal abnormality.  Normal size. Adrenals/Urinary Tract: Diffuse enlargement of the left adrenal gland. Right adrenal nodule is unchanged. No renal or ureteral stones. No renal or ureteral stones. No hydronephrosis. Urinary bladder unremarkable. Stomach/Bowel: High-density material throughout the colon making it difficult to assess for contrast extravasation. No obvious contrast extravasation. Moderate stool burden throughout the colon. No abnormal wall thickening to suggest ischemia. No evidence of bowel obstruction. Changes of gastric bypass. Lymphatic: No adenopathy Reproductive: Prior hysterectomy.  No adnexal masses. Other: No free fluid or free air. Musculoskeletal: Postoperative changes in the lumbar spine. Fluid collection in the midline of the back in the subcutaneous soft tissues measuring 5.1 x 3.7 cm compatible with postoperative fluid collection. No acute bony  abnormality. IMPRESSION: VASCULAR Scattered aortoiliac  atherosclerosis. No significant stenosis, aneurysm or dissection. Difficult to assess for GI bleed within the colon due to ingested high-density material throughout the colon. No obvious or large GI bleed visualized. NON-VASCULAR No changes of mesenteric ischemia noted. Mild gaseous distention of the colon moderate stool burden. Electronically Signed   By: Charlett Nose M.D.   On: 01/30/2023 22:19   DG Chest Port 1 View  Result Date: 01/30/2023 CLINICAL DATA:  Altered mental status EXAM: PORTABLE CHEST 1 VIEW COMPARISON:  09/09/2021 FINDINGS: Low volume AP portable examination. Cardiomegaly. Both lungs are clear. The visualized skeletal structures are unremarkable. IMPRESSION: Low volume AP portable examination. Cardiomegaly without acute abnormality of the lungs. Electronically Signed   By: Jearld Lesch M.D.   On: 01/30/2023 20:06   DG Lumbar Spine 2-3 Views  Result Date: 01/12/2023 CLINICAL DATA:  Elective surgery. EXAM: LUMBAR SPINE - 2-3 VIEW COMPARISON:  None Available. FINDINGS: Two fluoroscopic spot views of the lumbar spine obtained in the operating room. Pedicle screws at L4 and L5 with interbody spacer. Fluoroscopy time 16 seconds. Dose 24.47 mGy. IMPRESSION: Intraoperative fluoroscopy during lumbar fusion. Electronically Signed   By: Narda Rutherford M.D.   On: 01/12/2023 14:32   DG Lumbar Spine 1 View  Result Date: 01/12/2023 CLINICAL DATA:  Elective surgery, intraop for localization. EXAM: LUMBAR SPINE - 1 VIEW COMPARISON:  Preoperative radiograph 06/11/2022 FINDINGS: Portable cross-table lateral view of the lumbar spine obtained in the operating room. Surgical instrument localizes posteriorly at the L4 level. IMPRESSION: Surgical instrument localizes posteriorly at the L4 level. Electronically Signed   By: Narda Rutherford M.D.   On: 01/12/2023 12:28   DG C-Arm 1-60 Min-No Report  Result Date: 01/12/2023 Fluoroscopy was utilized by the requesting physician.  No radiographic  interpretation.    Medications / Allergies: per chart  Antibiotics: Anti-infectives (From admission, onward)    Start     Dose/Rate Route Frequency Ordered Stop   02/05/23 1245  piperacillin-tazobactam (ZOSYN) IVPB 3.375 g        3.375 g 12.5 mL/hr over 240 Minutes Intravenous Every 8 hours 02/05/23 1158 02/10/23 1359   02/02/23 0400  metroNIDAZOLE (FLAGYL) IVPB 500 mg  Status:  Discontinued        500 mg 100 mL/hr over 60 Minutes Intravenous Every 12 hours 02/02/23 0231 02/04/23 1747   01/31/23 0000  cefTRIAXone (ROCEPHIN) 1 g in sodium chloride 0.9 % 100 mL IVPB  Status:  Discontinued        1 g 200 mL/hr over 30 Minutes Intravenous  Once 01/30/23 2352 01/30/23 2354   01/31/23 0000  cefTRIAXone (ROCEPHIN) 1 g in sodium chloride 0.9 % 100 mL IVPB        1 g 200 mL/hr over 30 Minutes Intravenous Every 24 hours 01/30/23 2354 02/02/23 0253         Note: Portions of this report may have been transcribed using voice recognition software. Every effort was made to ensure accuracy; however, inadvertent computerized transcription errors may be present.   Any transcriptional errors that result from this process are unintentional.    Ardeth Sportsman, MD, FACS, MASCRS Esophageal, Gastrointestinal & Colorectal Surgery Robotic and Minimally Invasive Surgery  [facility] [address], [address] [place], [place] [address] [phone] Fax [phone] Main  CONTACT INFORMATION: Weekday (9AM-5PM): Call [facility] main office at [phone] Weeknight (5PM-9AM) or Weekend/Holiday: Check EPIC "Web Links" tab & use "AMION" (  password " [email or web address]") for General Surgery [facility] coverage  Please, DO NOT use SecureChat  (it is not reliable communication to reach operating surgeons & will lead to a delay in care).   Epic staff messaging available for outptient concerns needing 1-2 business day response.       02/05/2023  12:32 PM

## 2023-02-05 NOTE — Plan of Care (Signed)
  Problem: Education: Goal: Ability to verbalize activity precautions or restrictions will improve Outcome: Progressing Goal: Knowledge of the prescribed therapeutic regimen will improve Outcome: Progressing Goal: Understanding of discharge needs will improve Outcome: Progressing   Problem: Activity: Goal: Ability to avoid complications of mobility impairment will improve Outcome: Progressing Goal: Ability to tolerate increased activity will improve Outcome: Progressing Goal: Will remain free from falls Outcome: Progressing   Problem: Bowel/Gastric: Goal: Gastrointestinal status for postoperative course will improve Outcome: Progressing   Problem: Clinical Measurements: Goal: Ability to maintain clinical measurements within normal limits will improve Outcome: Progressing Goal: Postoperative complications will be avoided or minimized Outcome: Progressing Goal: Diagnostic test results will improve Outcome: Progressing   Problem: Pain Management: Goal: Pain level will decrease Outcome: Progressing   Problem: Skin Integrity: Goal: Will show signs of wound healing Outcome: Progressing   Problem: Health Behavior/Discharge Planning: Goal: Identification of resources available to assist in meeting health care needs will improve Outcome: Progressing   Problem: Bladder/Genitourinary: Goal: Urinary functional status for postoperative course will improve Outcome: Progressing   Problem: Education: Goal: Ability to describe self-care measures that may prevent or decrease complications (Diabetes Survival Skills Education) will improve Outcome: Progressing Goal: Individualized Educational Video(s) Outcome: Progressing   Problem: Coping: Goal: Ability to adjust to condition or change in health will improve Outcome: Progressing   Problem: Fluid Volume: Goal: Ability to maintain a balanced intake and output will improve Outcome: Progressing   Problem: Health Behavior/Discharge  Planning: Goal: Ability to identify and utilize available resources and services will improve Outcome: Progressing Goal: Ability to manage health-related needs will improve Outcome: Progressing   Problem: Metabolic: Goal: Ability to maintain appropriate glucose levels will improve Outcome: Progressing   Problem: Nutritional: Goal: Maintenance of adequate nutrition will improve Outcome: Progressing Goal: Progress toward achieving an optimal weight will improve Outcome: Progressing   Problem: Skin Integrity: Goal: Risk for impaired skin integrity will decrease Outcome: Progressing   Problem: Tissue Perfusion: Goal: Adequacy of tissue perfusion will improve Outcome: Progressing   Problem: Fluid Volume: Goal: Hemodynamic stability will improve Outcome: Progressing   Problem: Clinical Measurements: Goal: Diagnostic test results will improve Outcome: Progressing Goal: Signs and symptoms of infection will decrease Outcome: Progressing   Problem: Respiratory: Goal: Ability to maintain adequate ventilation will improve Outcome: Progressing   Problem: Education: Goal: Knowledge of General Education information will improve Description: Including pain rating scale, medication(s)/side effects and non-pharmacologic comfort measures Outcome: Progressing   Problem: Health Behavior/Discharge Planning: Goal: Ability to manage health-related needs will improve Outcome: Progressing   Problem: Clinical Measurements: Goal: Ability to maintain clinical measurements within normal limits will improve Outcome: Progressing Goal: Will remain free from infection Outcome: Progressing Goal: Diagnostic test results will improve Outcome: Progressing Goal: Respiratory complications will improve Outcome: Progressing Goal: Cardiovascular complication will be avoided Outcome: Progressing   Problem: Activity: Goal: Risk for activity intolerance will decrease Outcome: Progressing   Problem:  Nutrition: Goal: Adequate nutrition will be maintained Outcome: Progressing   Problem: Coping: Goal: Level of anxiety will decrease Outcome: Progressing   Problem: Elimination: Goal: Will not experience complications related to bowel motility Outcome: Progressing Goal: Will not experience complications related to urinary retention Outcome: Progressing   Problem: Pain Managment: Goal: General experience of comfort will improve Outcome: Progressing   Problem: Safety: Goal: Ability to remain free from injury will improve Outcome: Progressing   Problem: Skin Integrity: Goal: Risk for impaired skin integrity will decrease Outcome: Progressing

## 2023-02-06 ENCOUNTER — Encounter (HOSPITAL_COMMUNITY): Payer: Self-pay | Admitting: Gastroenterology

## 2023-02-06 DIAGNOSIS — D62 Acute posthemorrhagic anemia: Secondary | ICD-10-CM | POA: Diagnosis not present

## 2023-02-06 DIAGNOSIS — K922 Gastrointestinal hemorrhage, unspecified: Secondary | ICD-10-CM | POA: Diagnosis not present

## 2023-02-06 DIAGNOSIS — E038 Other specified hypothyroidism: Secondary | ICD-10-CM | POA: Diagnosis not present

## 2023-02-06 DIAGNOSIS — K55039 Acute (reversible) ischemia of large intestine, extent unspecified: Secondary | ICD-10-CM | POA: Diagnosis not present

## 2023-02-06 LAB — BASIC METABOLIC PANEL
Anion gap: 9 (ref 5–15)
BUN: 9 mg/dL (ref 8–23)
CO2: 22 mmol/L (ref 22–32)
Calcium: 7.8 mg/dL — ABNORMAL LOW (ref 8.9–10.3)
Chloride: 104 mmol/L (ref 98–111)
Creatinine, Ser: 1.21 mg/dL — ABNORMAL HIGH (ref 0.44–1.00)
GFR, Estimated: 51 mL/min — ABNORMAL LOW (ref >60)
Glucose, Bld: 83 mg/dL (ref 70–99)
Potassium: 2.9 mmol/L — ABNORMAL LOW (ref 3.5–5.1)
Sodium: 135 mmol/L (ref 135–145)

## 2023-02-06 LAB — HEMOGLOBIN AND HEMATOCRIT, BLOOD

## 2023-02-06 LAB — CBC
HCT: 25.8 % — ABNORMAL LOW (ref 36.0–46.0)
Hemoglobin: 8.6 g/dL — ABNORMAL LOW (ref 12.0–15.0)
MCH: 30.7 pg (ref 26.0–34.0)
MCHC: 33.3 g/dL (ref 30.0–36.0)
MCV: 92.1 fL (ref 80.0–100.0)
Platelets: 315 10*3/uL (ref 150–400)
RBC: 2.8 MIL/uL — ABNORMAL LOW (ref 3.87–5.11)
RDW: 16.6 % — ABNORMAL HIGH (ref 11.5–15.5)
WBC: 8.2 10*3/uL (ref 4.0–10.5)
nRBC: 0.4 % — ABNORMAL HIGH (ref 0.0–0.2)

## 2023-02-06 LAB — MAGNESIUM: Magnesium: 2 mg/dL (ref 1.7–2.4)

## 2023-02-06 LAB — GLUCOSE, CAPILLARY
Glucose-Capillary: 100 mg/dL — ABNORMAL HIGH (ref 70–99)
Glucose-Capillary: 85 mg/dL (ref 70–99)
Glucose-Capillary: 150 mg/dL — ABNORMAL HIGH (ref 70–99)
Glucose-Capillary: 103 mg/dL — ABNORMAL HIGH (ref 70–99)
Glucose-Capillary: 105 mg/dL — ABNORMAL HIGH (ref 70–99)

## 2023-02-06 MED ORDER — POTASSIUM CHLORIDE 10 MEQ/100ML IV SOLN
INTRAVENOUS | Status: AC
  Administered 2023-02-06: 10 meq via INTRAVENOUS
  Filled 2023-02-06: qty 100

## 2023-02-06 MED ORDER — POTASSIUM CHLORIDE CRYS ER 20 MEQ PO TBCR
40.0000 meq | EXTENDED_RELEASE_TABLET | Freq: Every day | ORAL | Status: AC
  Administered 2023-02-06 – 2023-02-08 (×3): 40 meq via ORAL
  Filled 2023-02-06 (×3): qty 2

## 2023-02-06 MED ORDER — OXYCODONE-ACETAMINOPHEN 5-325 MG PO TABS
1.0000 | ORAL_TABLET | ORAL | Status: DC | PRN
  Administered 2023-02-06 – 2023-02-08 (×5): 1 via ORAL
  Filled 2023-02-06 (×5): qty 1

## 2023-02-06 MED ORDER — POTASSIUM CHLORIDE 10 MEQ/100ML IV SOLN
10.0000 meq | INTRAVENOUS | Status: AC
  Administered 2023-02-06 (×5): 10 meq via INTRAVENOUS
  Filled 2023-02-06 (×5): qty 100

## 2023-02-06 NOTE — Progress Notes (Signed)
02/06/2023  Caroline Williams [id number] 06/25/1960  CARE TEAM: PCP: Hoy Register, MD  Outpatient Care Team: Patient Care Team: Hoy Register, MD as PCP - General (Family Medicine) Orbie Pyo, MD as PCP - Cardiology (Cardiology) Tressie Stalker, MD as Consulting Physician (Neurosurgery) Bradly Bienenstock, MD as Consulting Physician (Orthopedic Surgery) Ander Purpura, OD (Optometry) Kennon Rounds as Physician Assistant (Cardiology)  Inpatient Treatment Team: Treatment Team:  Joycelyn Das, MD Willis Modena, MD Jeronimo Greaves, CCC-SLP Sibyl Parr, MD Ccs, Md, MD Davis Gourd, RN Maceo Pro, RN Theotis Burrow I, RPH Gretchen Short, NT Swist, Debbie, RN Barnes, Josie E, [place]   Problem List:   Principal Problem:   Acute ischemic colitis [facility]) Active Problems:   Morbid obesity (HCC)   Diabetes mellitus with neuropathy (HCC)   History of Roux-en-Y gastric bypass 2016   Hypothyroidism   COPD mixed type (HCC)   Hyperlipidemia   Essential (primary) hypertension   Other chronic pain   CAD (coronary artery disease)   UTI (urinary tract infection)   CKD (chronic kidney disease) stage 3, GFR 30-59 ml/min (HCC)   AKI (acute kidney injury) (HCC)   Acute metabolic encephalopathy   ABLA (acute blood loss anemia)   Gastrointestinal hemorrhage with melena   Hypokalemia   Acute GI bleeding   02/05/2023  Procedure(s): COLONOSCOPY WITH PROPOFOL BIOPSY    Assessment [facility] Stay = 6 days) 1 Day Post-Op    Melena and GI bleeding with ischemic colitis most likely etiology  Stabilizing   Plan:  Liquids only for now.  If white count going down and feeling better, can consider advancing diet Monday   IV antibiotics.  I would do piperacillin/tazobactam for the next 5 days.  Need to follow white blood count as well.  I did add on to hemoglobin.  Reasonable to follow hemoglobins.  If she is not having severe diarrhea, can  consider backing off to just daily but defer to primary service.   She has no guarding or peritonitis.  Third CT scan yesterday 8/24 confirms colitis primarily left-sided without any pneumatosis or perforation.  I do not see any reason for repeating a CT scan at this time.  Can reassess if she does not improve clinically.   If she markedly deteriorates with peritonitis free air or recurrent GI bleeding requiring more than 6 units total, may need abdominal colectomy with colostomy.  Might get away with just left hemicolectomy depending on what is found intraoperatively versus a more standard subtotal abdominal colectomy.  She is pain-free and not in shock.  That is reassuring.  There is no indication for emergent surgery today.  Surgery will follow.   Agree with her correcting electrolytes and following potassium and phosphorus.  Can try some oral potassium if that helps.   History of gastric bypass.  Some irritation at GJ anastomosis.  Reasonable to do PPIs for now.   Renal failure/AKI seems to be resolving.  Continue hydrating.   Acute blood loss anemia with history gastric bypass.  No folate/B12 deficiency reassuring. -VTE prophylaxis- SCDs, etc   -mobilize as tolerated to help recovery.  See if she can start getting up more with supervision.      I reviewed nursing notes, hospitalist notes, last 24 h vitals and pain scores, last 48 h intake and output, last 24 h labs and trends, and last 24 h imaging results.  I have reviewed this patient's available data, including medical history, events of note, test  results, etc as part of my evaluation.   A significant portion of that time was spent in counseling. Care during the described time interval was provided by me.  This care required moderate level of medical decision making.  02/06/2023    Subjective: (Chief complaint)  Patient denies much pain.  Less diarrhea.  Stool still dark but not frankly bloody.  No nausea or  vomiting.  Tolerating liquids.  Objective:  Vital signs:  Vitals:   02/05/23 1558 02/05/23 1935 02/06/23 0428 02/06/23 0804  BP: 127/66 (!) 139/93 (!) 141/88   Pulse: 94 98 90 86  Resp: 18 18 18 18   Temp: 98 F (36.7 C) 98.4 F (36.9 C) 98 F (36.7 C)   TempSrc:  Oral Oral   SpO2: 99% 100% 100% 98%  Weight:      Height:        Last BM Date : 02/05/23  Intake/Output   Yesterday:  08/24 0701 - 08/25 0700 In: 340 [P.O.:240; I.V.:100] Out: 1700 [Urine:1700] This shift:  No intake/output data recorded.  Bowel function:  Flatus: YES  BM:  YES  Drain: (No drain)   Physical Exam:  General: Pt awake/alert in no acute distress Eyes: PERRL, normal EOM.  Sclera clear.  No icterus Neuro: CN II-XII intact w/o focal sensory/motor deficits. Lymph: No head/neck/groin lymphadenopathy Psych:  No delerium/psychosis/paranoia.  Oriented x 4.  Less groggy today. HENT: Normocephalic, Mucus membranes moist.  No thrush Neck: Supple, No tracheal deviation.  No obvious thyromegaly Chest: No pain to chest wall compression.  Good respiratory excursion.  No audible wheezing CV:  Pulses intact.  Regular rhythm.  No major extremity edema MS: Normal AROM mjr joints.  No obvious deformity  Abdomen: Soft.  Mildy distended.  Nontender.  No evidence of peritonitis.  No incarcerated hernias.  Ext:   No deformity.  No mjr edema.  No cyanosis Skin: No petechiae / purpurea.  No major sores.  Warm and dry    Results:   Cultures: Recent Results (from the past 720 hour(s))  Surgical pcr screen     Status: None   Collection Time: 01/11/23  9:17 AM   Specimen: Nasal Mucosa; Nasal Swab  Result Value Ref Range Status   MRSA, PCR NEGATIVE NEGATIVE Final   Staphylococcus aureus NEGATIVE NEGATIVE Final    Comment: (NOTE) The Xpert SA Assay (FDA approved for NASAL specimens in patients 18 years of age and older), is one component of a comprehensive surveillance program. It is not intended to  diagnose infection nor to guide or monitor treatment. Performed at [facility], [address] [place], [place] [address]   Blood culture (routine x 2)     Status: None   Collection Time: 01/30/23 10:39 PM   Specimen: BLOOD RIGHT ARM  Result Value Ref Range Status   Specimen Description BLOOD RIGHT ARM  Final   Special Requests   Final    BOTTLES DRAWN AEROBIC AND ANAEROBIC Blood Culture results may not be optimal due to an excessive volume of blood received in culture bottles   Culture   Final    NO GROWTH 5 DAYS Performed at [facility], [address], [place], [place] [address]    Report Status 02/05/2023 FINAL  Final  Urine Culture (for pregnant, neutropenic or urologic patients or patients with an indwelling urinary catheter)     Status: Abnormal   Collection Time: 01/30/23 11:57 PM   Specimen: Urine, Clean Catch  Result Value  Ref Range Status   Specimen Description URINE, CLEAN CATCH  Final   Special Requests   Final    NONE Performed at [facility], [address] [place], [place] [address]    Culture 70,000 COLONIES/mL ESCHERICHIA COLI (A)  Final   Report Status 02/01/2023 FINAL  Final   Organism ID, Bacteria ESCHERICHIA COLI (A)  Final      Susceptibility   Escherichia coli - MIC*    AMPICILLIN >=32 RESISTANT Resistant     CEFAZOLIN <=4 SENSITIVE Sensitive     CEFEPIME <=0.12 SENSITIVE Sensitive     CEFTRIAXONE <=0.25 SENSITIVE Sensitive     CIPROFLOXACIN 0.5 INTERMEDIATE Intermediate     GENTAMICIN >=16 RESISTANT Resistant     IMIPENEM <=0.25 SENSITIVE Sensitive     NITROFURANTOIN <=16 SENSITIVE Sensitive     TRIMETH/SULFA >=320 RESISTANT Resistant     AMPICILLIN/SULBACTAM 8 SENSITIVE Sensitive     PIP/TAZO <=4 SENSITIVE Sensitive     * 70,000 COLONIES/mL ESCHERICHIA COLI  Blood culture (routine x 2)     Status: None   Collection Time: 01/31/23  1:34 AM   Specimen: BLOOD LEFT HAND  Result Value Ref Range Status   Specimen Description  BLOOD LEFT HAND  Final   Special Requests   Final    BOTTLES DRAWN AEROBIC AND ANAEROBIC Blood Culture results may not be optimal due to an inadequate volume of blood received in culture bottles   Culture   Final    NO GROWTH 5 DAYS Performed at [facility], [address] [place], [place] [address]    Report Status 02/05/2023 FINAL  Final  MRSA Next Gen by PCR, Nasal     Status: None   Collection Time: 01/31/23  6:43 AM   Specimen: Nasal Mucosa; Nasal Swab  Result Value Ref Range Status   MRSA by PCR Next Gen NOT DETECTED NOT DETECTED Final    Comment: (NOTE) The GeneXpert MRSA Assay (FDA approved for NASAL specimens only), is one component of a comprehensive MRSA colonization surveillance program. It is not intended to diagnose MRSA infection nor to guide or monitor treatment for MRSA infections. Test performance is not FDA approved in patients less than 66 years old. Performed at [facility], [address], [place], [place] [address]     Labs: Results for orders placed or performed during the hospital encounter of 01/30/23 (from the past 48 hour(s))  Glucose, capillary     Status: None   Collection Time: 02/04/23  8:47 AM  Result Value Ref Range   Glucose-Capillary 95 70 - 99 mg/dL    Comment: Glucose reference range applies only to samples taken after fasting for at least 8 hours.  Glucose, capillary     Status: Abnormal   Collection Time: 02/04/23 12:42 PM  Result Value Ref Range   Glucose-Capillary 120 (H) 70 - 99 mg/dL    Comment: Glucose reference range applies only to samples taken after fasting for at least 8 hours.  Glucose, capillary     Status: Abnormal   Collection Time: 02/04/23  4:16 PM  Result Value Ref Range   Glucose-Capillary 141 (H) 70 - 99 mg/dL    Comment: Glucose reference range applies only to samples taken after fasting for at least 8 hours.  Hemoglobin and hematocrit, blood     Status: Abnormal   Collection Time: 02/04/23  5:49 PM   Result Value Ref Range   Hemoglobin 8.8 (L) 12.0 - 15.0  g/dL   HCT 09.6 (L) 04.5 - 40.9 %    Comment: Performed at [facility], [address], [place], [place] [address]  Glucose, capillary     Status: Abnormal   Collection Time: 02/04/23  9:25 PM  Result Value Ref Range   Glucose-Capillary 116 (H) 70 - 99 mg/dL    Comment: Glucose reference range applies only to samples taken after fasting for at least 8 hours.  Glucose, capillary     Status: None   Collection Time: 02/05/23 12:38 AM  Result Value Ref Range   Glucose-Capillary 78 70 - 99 mg/dL    Comment: Glucose reference range applies only to samples taken after fasting for at least 8 hours.  Glucose, capillary     Status: None   Collection Time: 02/05/23  4:14 AM  Result Value Ref Range   Glucose-Capillary 71 70 - 99 mg/dL    Comment: Glucose reference range applies only to samples taken after fasting for at least 8 hours.  Basic metabolic panel     Status: Abnormal   Collection Time: 02/05/23  4:44 AM  Result Value Ref Range   Sodium 136 135 - 145 mmol/L   Potassium 3.9 3.5 - 5.1 mmol/L    Comment: HEMOLYSIS AT THIS LEVEL MAY AFFECT RESULT   Chloride 104 98 - 111 mmol/L   CO2 20 (L) 22 - 32 mmol/L   Glucose, Bld 96 70 - 99 mg/dL    Comment: Glucose reference range applies only to samples taken after fasting for at least 8 hours.   BUN 17 8 - 23 mg/dL   Creatinine, Ser 4.78 (H) 0.44 - 1.00 mg/dL   Calcium 7.7 (L) 8.9 - 10.3 mg/dL   GFR, Estimated >29 >56 mL/min    Comment: (NOTE) Calculated using the CKD-EPI Creatinine Equation (2021)    Anion gap 12 5 - 15    Comment: Performed at [facility], [address] [place], [place] [address]  Magnesium     Status: None   Collection Time: 02/05/23  4:44 AM  Result Value Ref Range   Magnesium 2.2 1.7 - 2.4 mg/dL    Comment: Performed at [facility], [address] [place], [place] [address]  Phosphorus     Status: Abnormal   Collection Time: 02/05/23   4:44 AM  Result Value Ref Range   Phosphorus 1.7 (L) 2.5 - 4.6 mg/dL    Comment: Performed at [facility] Lab, [address], [place], [place] [address]  Hemoglobin and hematocrit, blood     Status: Abnormal   Collection Time: 02/05/23  4:44 AM  Result Value Ref Range   Hemoglobin 9.0 (L) 12.0 - 15.0 g/dL   HCT 52.8 (L) 41.3 - 24.4 %    Comment: Performed at [facility], [address], [place], [place] [address]  Glucose, capillary     Status: None   Collection Time: 02/05/23  7:42 AM  Result Value Ref Range   Glucose-Capillary 80 70 - 99 mg/dL    Comment: Glucose reference range applies only to samples taken after fasting for at least 8 hours.   Comment 1 Document in Chart   Glucose, capillary     Status: Abnormal   Collection Time: 02/05/23  8:50 AM  Result Value Ref Range   Glucose-Capillary 68 (L) 70 - 99 mg/dL    Comment: Glucose reference range applies only to samples taken after fasting for at least 8  hours.  Glucose, capillary     Status: Abnormal   Collection Time: 02/05/23  9:21 AM  Result Value Ref Range   Glucose-Capillary 65 (L) 70 - 99 mg/dL    Comment: Glucose reference range applies only to samples taken after fasting for at least 8 hours.  Glucose, capillary     Status: None   Collection Time: 02/05/23  9:40 AM  Result Value Ref Range   Glucose-Capillary 82 70 - 99 mg/dL    Comment: Glucose reference range applies only to samples taken after fasting for at least 8 hours.  Glucose, capillary     Status: None   Collection Time: 02/05/23 11:12 AM  Result Value Ref Range   Glucose-Capillary 90 70 - 99 mg/dL    Comment: Glucose reference range applies only to samples taken after fasting for at least 8 hours.  Lactic acid, plasma     Status: Abnormal   Collection Time: 02/05/23 11:37 AM  Result Value Ref Range   Lactic Acid, Venous 2.0 (HH) 0.5 - 1.9 mmol/L    Comment: CRITICAL RESULT CALLED TO, READ BACK BY AND VERIFIED WITH B [facility] RN AT 1237  [phone] BY D LONG Performed at [facility] Lab, [address], [place], [place] [address]   Lactic acid, plasma     Status: Abnormal   Collection Time: 02/05/23  2:33 PM  Result Value Ref Range   Lactic Acid, Venous 2.2 (HH) 0.5 - 1.9 mmol/L    Comment: CRITICAL VALUE NOTED. VALUE IS CONSISTENT WITH PREVIOUSLY REPORTED/CALLED VALUE Performed at [facility], [address], [place], [place] [address]   Glucose, capillary     Status: Abnormal   Collection Time: 02/05/23  4:00 PM  Result Value Ref Range   Glucose-Capillary 115 (H) 70 - 99 mg/dL    Comment: Glucose reference range applies only to samples taken after fasting for at least 8 hours.  Hemoglobin and hematocrit, blood     Status: Abnormal   Collection Time: 02/05/23  5:50 PM  Result Value Ref Range   Hemoglobin 9.1 (L) 12.0 - 15.0 g/dL   HCT 14.7 (L) 82.9 - 56.2 %    Comment: Performed at [facility], [address], [place], [place] [address]  Glucose, capillary     Status: Abnormal   Collection Time: 02/05/23  7:38 PM  Result Value Ref Range   Glucose-Capillary 124 (H) 70 - 99 mg/dL    Comment: Glucose reference range applies only to samples taken after fasting for at least 8 hours.  Glucose, capillary     Status: Abnormal   Collection Time: 02/06/23  4:32 AM  Result Value Ref Range   Glucose-Capillary 100 (H) 70 - 99 mg/dL    Comment: Glucose reference range applies only to samples taken after fasting for at least 8 hours.  Hemoglobin and hematocrit, blood     Status: Abnormal   Collection Time: 02/06/23  6:06 AM  Result Value Ref Range   Hemoglobin 8.4 (L) 12.0 - 15.0 g/dL   HCT 57.8 (L) 46.9 - 62.9 %    Comment: Performed at [facility], [address], [place], [place] [address]  Basic metabolic panel     Status: Abnormal   Collection Time: 02/06/23  6:06 AM  Result Value Ref Range   Sodium 135 135 - 145 mmol/L   Potassium 2.9 (L) 3.5 - 5.1 mmol/L   Chloride 104 98 - 111 mmol/L   CO2  22  22 - 32 mmol/L   Glucose, Bld 83 70 - 99 mg/dL    Comment: Glucose reference range applies only to samples taken after fasting for at least 8 hours.   BUN 9 8 - 23 mg/dL   Creatinine, Ser 0.92 (H) 0.44 - 1.00 mg/dL   Calcium 7.8 (L) 8.9 - 10.3 mg/dL   GFR, Estimated 51 (L) >60 mL/min    Comment: (NOTE) Calculated using the CKD-EPI Creatinine Equation (2021)    Anion gap 9 5 - 15    Comment: Performed at [facility], [address] [place], [place] [address]  Magnesium     Status: None   Collection Time: 02/06/23  6:06 AM  Result Value Ref Range   Magnesium 2.0 1.7 - 2.4 mg/dL    Comment: Performed at [facility], [address], [place], [place] [address]    Imaging / Studies: CT Angio Abd/Pel w/ and/or w/o  Result Date: 02/05/2023 CLINICAL DATA:  Abdominal pain, evaluate for mesenteric ischemia, ulcerated mucosa identified by colonoscopy EXAM: CTA ABDOMEN AND PELVIS WITHOUT AND WITH CONTRAST TECHNIQUE: Multidetector CT imaging of the abdomen and pelvis was performed using the standard protocol during bolus administration of intravenous contrast. Multiplanar reconstructed images and MIPs were obtained and reviewed to evaluate the vascular anatomy. RADIATION DOSE REDUCTION: This exam was performed according to the departmental dose-optimization program which includes automated exposure control, adjustment of the mA and/or kV according to patient size and/or use of iterative reconstruction technique. CONTRAST:  75mL OMNIPAQUE IOHEXOL 350 MG/ML SOLN COMPARISON:  02/01/2023 FINDINGS: VASCULAR Normal contour and caliber of the abdominal aorta. No evidence of aneurysm, dissection, or other acute aortic pathology. Replaced right hepatic artery arising from the superior mesenteric artery. Solitary bilateral renal arteries. Aortic branch vessels are widely patent, with specific attention to the superior and inferior mesenteric arteries. Mild aortic atherosclerosis. Review of the MIP  images confirms the above findings. NON-VASCULAR Lower Chest: Small bilateral pleural effusions and associated atelectasis or consolidation. Hepatobiliary: No solid liver abnormality is seen. No gallstones, gallbladder wall thickening, or biliary dilatation. Pancreas: Unremarkable. No pancreatic ductal dilatation or surrounding inflammatory changes. Spleen: Normal in size without significant abnormality. Adrenals/Urinary Tract: Unchanged, definitively benign macroscopic fat containing right adrenal adenoma, for which no further follow-up or characterization is required. Similarly benign, non nodular adenomatous thickening of the left adrenal gland, requiring no further follow-up or characterization. Kidneys are normal, without renal calculi, solid lesion, or hydronephrosis. Urinary bladder decompressed by Foley catheter. Stomach/Bowel: Status post Roux type gastric bypass. Severe, long segment wall thickening of the distal colon extending from the distal transverse colon through the rectum. Lymphatic: No enlarged abdominal or pelvic lymph nodes. Reproductive: Status post hysterectomy. Other: No abdominal wall hernia or abnormality. Small volume ascites throughout the abdomen and pelvis. Musculoskeletal: No acute osseous findings. IMPRESSION: 1. Normal contour and caliber of the abdominal aorta. No evidence of aneurysm, dissection, or other acute aortic pathology. Aortic branch vessels are widely patent, with specific attention to the superior and inferior mesenteric arteries. Mild aortic atherosclerosis 2. Severe, long segment wall thickening of the distal colon extending from the distal transverse colon through the rectum, consistent with colitis and similar to prior examination. 3. Small volume ascites, likely reactive. 4. Small bilateral pleural effusions and associated atelectasis or consolidation. 5. Status post Roux type gastric bypass. Aortic Atherosclerosis (ICD10-I70.0). Electronically Signed   By: Jearld Lesch M.D.   On: 02/05/2023 13:51    Medications /  Allergies: per chart  Antibiotics: Anti-infectives (From admission, onward)    Start     Dose/Rate Route Frequency Ordered Stop   02/05/23 1245  piperacillin-tazobactam (ZOSYN) IVPB 3.375 g        3.375 g 12.5 mL/hr over 240 Minutes Intravenous Every 8 hours 02/05/23 1158 02/10/23 1359   02/02/23 0400  metroNIDAZOLE (FLAGYL) IVPB 500 mg  Status:  Discontinued        500 mg 100 mL/hr over 60 Minutes Intravenous Every 12 hours 02/02/23 0231 02/04/23 1747   01/31/23 0000  cefTRIAXone (ROCEPHIN) 1 g in sodium chloride 0.9 % 100 mL IVPB  Status:  Discontinued        1 g 200 mL/hr over 30 Minutes Intravenous  Once 01/30/23 2352 01/30/23 2354   01/31/23 0000  cefTRIAXone (ROCEPHIN) 1 g in sodium chloride 0.9 % 100 mL IVPB        1 g 200 mL/hr over 30 Minutes Intravenous Every 24 hours 01/30/23 2354 02/02/23 0253         Note: Portions of this report may have been transcribed using voice recognition software. Every effort was made to ensure accuracy; however, inadvertent computerized transcription errors may be present.   Any transcriptional errors that result from this process are unintentional.    Ardeth Sportsman, MD, FACS, MASCRS Esophageal, Gastrointestinal & Colorectal Surgery Robotic and Minimally Invasive Surgery  [facility] [address] [place], [place] [address] [phone] Fax [phone] Main  CONTACT INFORMATION: Weekday (9AM-5PM): Call [facility] main office at [phone] Weeknight (5PM-9AM) or Weekend/Holiday: Check EPIC "Web Links" tab & use "AMION" (password " [email or web address]") for General Surgery [facility] coverage  Please, DO NOT use SecureChat  (it is not reliable communication to reach operating surgeons & will lead to a delay in care).   Epic staff messaging available for outptient concerns needing 1-2 business day response.      02/06/2023  8:08 AM

## 2023-02-06 NOTE — Plan of Care (Signed)
  Problem: Education: Goal: Ability to verbalize activity precautions or restrictions will improve Outcome: Progressing Goal: Knowledge of the prescribed therapeutic regimen will improve Outcome: Progressing Goal: Understanding of discharge needs will improve Outcome: Progressing   Problem: Activity: Goal: Ability to avoid complications of mobility impairment will improve Outcome: Progressing Goal: Ability to tolerate increased activity will improve Outcome: Progressing Goal: Will remain free from falls Outcome: Progressing   Problem: Bowel/Gastric: Goal: Gastrointestinal status for postoperative course will improve Outcome: Progressing   Problem: Clinical Measurements: Goal: Ability to maintain clinical measurements within normal limits will improve Outcome: Progressing Goal: Postoperative complications will be avoided or minimized Outcome: Progressing Goal: Diagnostic test results will improve Outcome: Progressing   Problem: Pain Management: Goal: Pain level will decrease Outcome: Progressing   Problem: Skin Integrity: Goal: Will show signs of wound healing Outcome: Progressing   Problem: Health Behavior/Discharge Planning: Goal: Identification of resources available to assist in meeting health care needs will improve Outcome: Progressing   Problem: Bladder/Genitourinary: Goal: Urinary functional status for postoperative course will improve Outcome: Progressing   Problem: Education: Goal: Ability to describe self-care measures that may prevent or decrease complications (Diabetes Survival Skills Education) will improve Outcome: Progressing Goal: Individualized Educational Video(s) Outcome: Progressing   Problem: Coping: Goal: Ability to adjust to condition or change in health will improve Outcome: Progressing   Problem: Fluid Volume: Goal: Ability to maintain a balanced intake and output will improve Outcome: Progressing   Problem: Health Behavior/Discharge  Planning: Goal: Ability to identify and utilize available resources and services will improve Outcome: Progressing Goal: Ability to manage health-related needs will improve Outcome: Progressing   Problem: Metabolic: Goal: Ability to maintain appropriate glucose levels will improve Outcome: Progressing   Problem: Nutritional: Goal: Maintenance of adequate nutrition will improve Outcome: Progressing Goal: Progress toward achieving an optimal weight will improve Outcome: Progressing   Problem: Skin Integrity: Goal: Risk for impaired skin integrity will decrease Outcome: Progressing   Problem: Tissue Perfusion: Goal: Adequacy of tissue perfusion will improve Outcome: Progressing   Problem: Fluid Volume: Goal: Hemodynamic stability will improve Outcome: Progressing   Problem: Clinical Measurements: Goal: Diagnostic test results will improve Outcome: Progressing Goal: Signs and symptoms of infection will decrease Outcome: Progressing   Problem: Respiratory: Goal: Ability to maintain adequate ventilation will improve Outcome: Progressing   Problem: Education: Goal: Knowledge of General Education information will improve Description: Including pain rating scale, medication(s)/side effects and non-pharmacologic comfort measures Outcome: Progressing   Problem: Health Behavior/Discharge Planning: Goal: Ability to manage health-related needs will improve Outcome: Progressing   Problem: Clinical Measurements: Goal: Ability to maintain clinical measurements within normal limits will improve Outcome: Progressing Goal: Will remain free from infection Outcome: Progressing Goal: Diagnostic test results will improve Outcome: Progressing Goal: Respiratory complications will improve Outcome: Progressing Goal: Cardiovascular complication will be avoided Outcome: Progressing   Problem: Activity: Goal: Risk for activity intolerance will decrease Outcome: Progressing   Problem:  Nutrition: Goal: Adequate nutrition will be maintained Outcome: Progressing   Problem: Coping: Goal: Level of anxiety will decrease Outcome: Progressing   Problem: Elimination: Goal: Will not experience complications related to bowel motility Outcome: Progressing Goal: Will not experience complications related to urinary retention Outcome: Progressing   Problem: Pain Managment: Goal: General experience of comfort will improve Outcome: Progressing   Problem: Safety: Goal: Ability to remain free from injury will improve Outcome: Progressing   Problem: Skin Integrity: Goal: Risk for impaired skin integrity will decrease Outcome: Progressing

## 2023-02-06 NOTE — TOC Progression Note (Signed)
Transition of Care [facility]) - Progression Note    Patient Details  Name: Caroline Williams MRN: [id number] Date of Birth: 1961/04/16  Transition of Care [facility]) CM/SW Contact  Lawerance Sabal, RN Phone Number: 02/06/2023, 11:36 AM  Clinical Narrative:     Sherron Monday w patient over the phone. She confirms that she wishes to DC to home, not CIR, this is consistent with what was reported by CIR liaison.  Patient states she has shower chair and RW at home, declined 3/1.  Patient has no HH Hx and no preference for provider. [facility] has accepted referral   Expected Discharge Plan: Home w Home Health Services Barriers to Discharge: Continued Medical Work up  Expected Discharge Plan and Services   Discharge Planning Services: CM Consult Post Acute Care Choice: Home Health Living arrangements for the past 2 months: Single Family Home                 DME Arranged: N/A         HH Arranged: PT, OT HH Agency: [facility] Date [facility] Agency Contacted: 02/06/23 Time HH Agency Contacted: 1136 Representative spoke with at [facility] Agency: Kandee Keen   Social Determinants of Health (SDOH) Interventions SDOH Screenings   Food Insecurity: No Food Insecurity (02/04/2023)  Housing: Low Risk  (02/04/2023)  Transportation Needs: Unmet Transportation Needs (02/04/2023)  Utilities: Not At Risk (02/04/2023)  Alcohol Screen: Low Risk  (11/03/2022)  Depression (PHQ2-9): Low Risk  (11/03/2022)  Financial Resource Strain: Medium Risk (11/03/2022)  Physical Activity: Inactive (11/03/2022)  Social Connections: Moderately Integrated (11/03/2022)  Stress: Stress Concern Present (11/03/2022)  Tobacco Use: Medium Risk (02/05/2023)    Readmission Risk Interventions    02/02/2023   11:22 AM  Readmission Risk Prevention Plan  Transportation Screening Complete  PCP or Specialist Appt within 5-7 Days Complete  Home Care Screening Complete

## 2023-02-06 NOTE — Progress Notes (Signed)
[facility] Gastroenterology Progress Note  SHARLYN EKERN 62 y.o. August 02, 1960   Subjective: Black stool today per nursing. Feels bloated. Denies abdominal pain. Two sisters by bedside.  Objective: Vital signs: Vitals:   02/06/23 0804 02/06/23 0901  BP:  (!) 149/92  Pulse: 86 90  Resp: 18 18  Temp:  98.2 F (36.8 C)  SpO2: 98% 96%    Physical Exam: Gen: lethargic, obese, no acute distress, pleasant HEENT: anicteric sclera CV: RRR Chest: CTA B Abd: soft, nontender, nondistended, +BS Ext: no edema  Lab Results: Recent Labs    02/04/23 0807 02/05/23 0444 02/06/23 0606  NA 136 136 135  K 3.1* 3.9 2.9*  CL 103 104 104  CO2 21* 20* 22  GLUCOSE 97 96 83  BUN 20 17 9   CREATININE 1.14* 1.01* 1.21*  CALCIUM 7.5* 7.7* 7.8*  MG 2.0 2.2 2.0  PHOS 2.0* 1.7*  --    No results for input(s): "AST", "ALT", "ALKPHOS", "BILITOT", "PROT", "ALBUMIN" in the last 72 hours. Recent Labs    02/05/23 1750 02/06/23 0606  WBC  --  8.2  HGB 9.1* 8.6*  TEST WILL BE CREDITED  HCT 27.7* 25.8*  TEST WILL BE CREDITED  MCV  --  92.1  PLT  --  315      Assessment/Plan: Left-sided ischemic colitis - CT ango without mesenteric ischemia. Left sided colonic inflammation. Hgb 9.1. On IV Zosyn. Appreciate surgical recs. Full liquid diet and do not advance further today. Supportive care. Dr. Lorenso Quarry to f/u tomorrow.    Shirley Friar 02/06/2023, 4:05 PM  Questions please call [phone] ID: [name], female   DOB: [birth year], 62 y.o.   MRN: [id number]

## 2023-02-06 NOTE — Progress Notes (Signed)
PROGRESS NOTE    Caroline Williams  OAC:[id number] DOB: 09/15/1960 DOA: 01/30/2023 PCP: Hoy Register, MD    Brief Narrative:   62 year old female with history of coronary artery disease, COPD, GERD, thyroid disease presented to the hospital after several days of abdominal pain, vomiting and dark stool. Not on blood thinners, no report of heavy alcohol use.  On initial presentation patient had hemoglobin of 3.0.  Patient was initially admitted to the intensive care unit for management of acute blood loss anemia secondary to GI bleed and underwent endoscopic evaluation on 01/31/2023 by GI.  Patient was subsequently considered stable for transfer out of the ICU.  Assessment and Plan:  Acute blood loss anemia Gastrointestinal hemorrhage with melena Patient has received multiple units of transfusion including 6 units PRBC, 1 unit each of platelets, FFP, cryoprecipitate during hospitalization.  Underwent EGD without obvious upper GI source.  Continued to have bright red blood and melanotic stools so GI was consulted and patient underwent colonoscopy evaluation 02/05/2023 with findings of ischemic colitis.  GI recommended surgical consultation and general surgery has recommend bowel rest, liquid diet and IV Zosyn.  Recommend IV Zosyn for next 5 days.  Lactate was 2.0. on Ringer lactate infusion.  Latest hemoglobin of 8.6.  If continues to improve start solid diet Monday.   Hypophosphatemia, replenished.  Check labs AM  Hypokalemia.  Potassium today at 2.9.  Will give 60 mEq of IV potassium today.  Magnesium of 2.0.  UTI Completed course of Rocephin on 02/02/2023   AKI on CKD - Concern for ATN due to hypovolemia and profound anemia.  Latest creatinine of 1.2.  I   Hypothyroidism Markedly elevated TSH and low T4.  Continue Synthroid, on hydrocortisone.  Will DC hydrocortisone.   Acute urinary retention - On Foley catheter.  Will do voiding trial in AM.  Diabetes mellitus Continue sliding scale  insulin, Accu-Cheks diabetic diet.  Continue to hold Farxiga   Coronary artery disease Imdur on hold.  No acute issues.   COPD Continue home umeclidinium-vilanterol.  Appears comfortable.   Acute metabolic encephalopathy - Secondary to blood loss and profound anemia.  Improved.  Avoid sedatives.   Chronic pain status post L4-5 decompression and fusion Patient is on  MS Contin and oxycodone at home.  Linzess on hold.  Has been started on Percocet at this time due to diffuse pain.  Avoiding diclofenac gel for now.  States that oxycodone helps her to.  Still holding off MS Contin for now.   History of mood disorder, bipolar 1, ADHD On Adderall, doxepin, Rexulti, Trintellix at home., all currently held in setting of encephalopathy.  Continue Xanax.  Gradually introduce by tomorrow.   Vitamin D deficiency: Continue vitamin D 50,000 units p.o. weekly, follow with PCP to repeat vitamin D level after 3 to 6 months.  Debility, deconditioning.  PT has recommended CIR at this time.     DVT prophylaxis: SCDs Start: 01/31/23 0307   Code Status:     Code Status: Full Code  Disposition: CIR as per PT recommendation.  Status is: Inpatient  Remains inpatient appropriate because: Need for CIR, pending clinical improvement, IV Zosyn,   Family Communication:  Spoke with the patient's husband on the phone on 02/04/2023  Consultants:  PCCM GI  Procedures:  EGD on 01/31/2023 Transfusion of PRBC and blood products Colonoscopy 02/05/2023  Antimicrobials:  Zosyn IV  Anti-infectives (From admission, onward)    Start     Dose/Rate Route Frequency Ordered Stop  02/05/23 1245  piperacillin-tazobactam (ZOSYN) IVPB 3.375 g        3.375 g 12.5 mL/hr over 240 Minutes Intravenous Every 8 hours 02/05/23 1158 02/10/23 1359   02/02/23 0400  metroNIDAZOLE (FLAGYL) IVPB 500 mg  Status:  Discontinued        500 mg 100 mL/hr over 60 Minutes Intravenous Every 12 hours 02/02/23 0231 02/04/23 1747    01/31/23 0000  cefTRIAXone (ROCEPHIN) 1 g in sodium chloride 0.9 % 100 mL IVPB  Status:  Discontinued        1 g 200 mL/hr over 30 Minutes Intravenous  Once 01/30/23 2352 01/30/23 2354   01/31/23 0000  cefTRIAXone (ROCEPHIN) 1 g in sodium chloride 0.9 % 100 mL IVPB        1 g 200 mL/hr over 30 Minutes Intravenous Every 24 hours 01/30/23 2354 02/02/23 0253       Subjective:  Today, patient was seen and examined at bedside.  Complains of back pain but denies any abdominal pain nausea vomiting.  Had black stool yesterday.  Denies any shortness of breath chest pain.   Objective: Vitals:   02/05/23 1935 02/06/23 0428 02/06/23 0804 02/06/23 0901  BP: (!) 139/93 (!) 141/88  (!) 149/92  Pulse: 98 90 86 90  Resp: 18 18 18 18   Temp: 98.4 F (36.9 C) 98 F (36.7 C)  98.2 F (36.8 C)  TempSrc: Oral Oral    SpO2: 100% 100% 98% 96%  Weight:      Height:        Intake/Output Summary (Last 24 hours) at 02/06/2023 1021 Last data filed at 02/06/2023 1017 Gross per 24 hour  Intake 240 ml  Output 2700 ml  Net -2460 ml   Filed Weights   01/31/23 0649 01/31/23 0938 02/05/23 0837  Weight: 103.1 kg 103.1 kg 97.5 kg    Physical Examination: Body mass index is 35.78 kg/m.   General: Obese built, not in obvious distress, alert awake and Communicative. HENT: Mild pallor noted.  Oral mucosa moist. Chest:  .  Diminished breath sounds bilaterally. No crackles or wheezes.  CVS: S1 &S2 heard. No murmur.  Regular rate and rhythm. Abdomen: Soft nontender, bowel sounds heard.  Foley catheter in place. Extremities: No cyanosis, clubbing or edema.  Peripheral pulses are palpable. Psych: Alert, awake and oriented, normal mood. CNS:  No cranial nerve deficits.  Power equal in all extremities.   Skin: Warm and dry.  No rashes noted.  Data Reviewed:   CBC: Recent Labs  Lab 01/30/23 1954 01/30/23 2305 02/01/23 1800 02/02/23 0045 02/02/23 0327 02/02/23 0557 02/02/23 1141 02/03/23 0744  02/04/23 0807 02/04/23 1749 02/05/23 0444 02/05/23 1750 02/06/23 0606  WBC 13.8*   < > 10.7* 11.3*  --  13.0* 13.6*  --   --   --   --   --  8.2  NEUTROABS 11.4*  --   --   --   --   --   --   --   --   --   --   --   --   HGB 3.0*   < > 6.8* 7.0*   < > 7.2* 8.1*   < > 8.1* 8.8* 9.0* 9.1* 8.6*  TEST WILL BE CREDITED  HCT 10.0*   < > 20.1* 20.8*   < > 21.9* 24.1*   < > 23.9* 26.4* 27.3* 27.7* 25.8*  TEST WILL BE CREDITED  MCV 99.0   < > 87.4 88.1  --  88.7  89.9  --   --   --   --   --  92.1  PLT 350   < > 206 202  --  191 218  --   --   --   --   --  315   < > = values in this interval not displayed.    Basic Metabolic Panel: Recent Labs  Lab 01/31/23 0406 01/31/23 1836 02/02/23 0557 02/02/23 1141 02/03/23 0744 02/04/23 0807 02/05/23 0444 02/06/23 0606  NA 135   < > 134*  --  135 136 136 135  K 3.8   < > 3.4*  --  3.1* 3.1* 3.9 2.9*  CL 102   < > 107  --  104 103 104 104  CO2 17*   < > 19*  --  21* 21* 20* 22  GLUCOSE 97   < > 113*  --  100* 97 96 83  BUN 72*   < > 39*  --  25* 20 17 9   CREATININE 2.37*   < > 1.44*  --  1.13* 1.14* 1.01* 1.21*  CALCIUM 7.5*   < > 7.1*  --  7.4* 7.5* 7.7* 7.8*  MG 2.2  --   --  2.2 2.2 2.0 2.2 2.0  PHOS 6.0*  --   --  1.4* 2.2* 2.0* 1.7*  --    < > = values in this interval not displayed.    Liver Function Tests: Recent Labs  Lab 01/30/23 1933 01/31/23 0406 02/02/23 1141  AST 52* 63* 35  ALT 29 33 32  ALKPHOS 72 72 58  BILITOT 0.7 0.6 0.5  PROT 6.0* 5.6* 5.8*  ALBUMIN 2.9* 2.8* 2.3*     Radiology Studies: CT Angio Abd/Pel w/ and/or w/o  Result Date: 02/05/2023 CLINICAL DATA:  Abdominal pain, evaluate for mesenteric ischemia, ulcerated mucosa identified by colonoscopy EXAM: CTA ABDOMEN AND PELVIS WITHOUT AND WITH CONTRAST TECHNIQUE: Multidetector CT imaging of the abdomen and pelvis was performed using the standard protocol during bolus administration of intravenous contrast. Multiplanar reconstructed images and MIPs were  obtained and reviewed to evaluate the vascular anatomy. RADIATION DOSE REDUCTION: This exam was performed according to the departmental dose-optimization program which includes automated exposure control, adjustment of the mA and/or kV according to patient size and/or use of iterative reconstruction technique. CONTRAST:  75mL OMNIPAQUE IOHEXOL 350 MG/ML SOLN COMPARISON:  02/01/2023 FINDINGS: VASCULAR Normal contour and caliber of the abdominal aorta. No evidence of aneurysm, dissection, or other acute aortic pathology. Replaced right hepatic artery arising from the superior mesenteric artery. Solitary bilateral renal arteries. Aortic branch vessels are widely patent, with specific attention to the superior and inferior mesenteric arteries. Mild aortic atherosclerosis. Review of the MIP images confirms the above findings. NON-VASCULAR Lower Chest: Small bilateral pleural effusions and associated atelectasis or consolidation. Hepatobiliary: No solid liver abnormality is seen. No gallstones, gallbladder wall thickening, or biliary dilatation. Pancreas: Unremarkable. No pancreatic ductal dilatation or surrounding inflammatory changes. Spleen: Normal in size without significant abnormality. Adrenals/Urinary Tract: Unchanged, definitively benign macroscopic fat containing right adrenal adenoma, for which no further follow-up or characterization is required. Similarly benign, non nodular adenomatous thickening of the left adrenal gland, requiring no further follow-up or characterization. Kidneys are normal, without renal calculi, solid lesion, or hydronephrosis. Urinary bladder decompressed by Foley catheter. Stomach/Bowel: Status post Roux type gastric bypass. Severe, long segment wall thickening of the distal colon extending from the distal transverse colon through the rectum. Lymphatic: No enlarged abdominal or  pelvic lymph nodes. Reproductive: Status post hysterectomy. Other: No abdominal wall hernia or abnormality.  Small volume ascites throughout the abdomen and pelvis. Musculoskeletal: No acute osseous findings. IMPRESSION: 1. Normal contour and caliber of the abdominal aorta. No evidence of aneurysm, dissection, or other acute aortic pathology. Aortic branch vessels are widely patent, with specific attention to the superior and inferior mesenteric arteries. Mild aortic atherosclerosis 2. Severe, long segment wall thickening of the distal colon extending from the distal transverse colon through the rectum, consistent with colitis and similar to prior examination. 3. Small volume ascites, likely reactive. 4. Small bilateral pleural effusions and associated atelectasis or consolidation. 5. Status post Roux type gastric bypass. Aortic Atherosclerosis (ICD10-I70.0). Electronically Signed   By: Jearld Lesch M.D.   On: 02/05/2023 13:51      LOS: 6 days    Joycelyn Das, MD [facility] Available via Epic secure chat 7am-7pm After these hours, please refer to coverage provider listed on [email or web address] 02/06/2023, 10:21 AM

## 2023-02-07 DIAGNOSIS — K55039 Acute (reversible) ischemia of large intestine, extent unspecified: Secondary | ICD-10-CM | POA: Diagnosis not present

## 2023-02-07 DIAGNOSIS — D62 Acute posthemorrhagic anemia: Secondary | ICD-10-CM | POA: Diagnosis not present

## 2023-02-07 DIAGNOSIS — E038 Other specified hypothyroidism: Secondary | ICD-10-CM | POA: Diagnosis not present

## 2023-02-07 DIAGNOSIS — K922 Gastrointestinal hemorrhage, unspecified: Secondary | ICD-10-CM | POA: Diagnosis not present

## 2023-02-07 LAB — COMPREHENSIVE METABOLIC PANEL
ALT: 27 U/L (ref 0–44)
AST: 24 U/L (ref 15–41)
Albumin: 2.6 g/dL — ABNORMAL LOW (ref 3.5–5.0)
Alkaline Phosphatase: 58 U/L (ref 38–126)
Anion gap: 10 (ref 5–15)
BUN: 7 mg/dL — ABNORMAL LOW (ref 8–23)
CO2: 20 mmol/L — ABNORMAL LOW (ref 22–32)
Calcium: 8.1 mg/dL — ABNORMAL LOW (ref 8.9–10.3)
Chloride: 104 mmol/L (ref 98–111)
Creatinine, Ser: 1.56 mg/dL — ABNORMAL HIGH (ref 0.44–1.00)
GFR, Estimated: 37 mL/min — ABNORMAL LOW (ref >60)
Glucose, Bld: 112 mg/dL — ABNORMAL HIGH (ref 70–99)
Potassium: 3.5 mmol/L (ref 3.5–5.1)
Sodium: 134 mmol/L — ABNORMAL LOW (ref 135–145)
Total Bilirubin: 0.6 mg/dL (ref 0.3–1.2)
Total Protein: 5.7 g/dL — ABNORMAL LOW (ref 6.5–8.1)

## 2023-02-07 LAB — CBC
HCT: 30.4 % — ABNORMAL LOW (ref 36.0–46.0)
Hemoglobin: 9.5 g/dL — ABNORMAL LOW (ref 12.0–15.0)
MCH: 30.2 pg (ref 26.0–34.0)
MCHC: 31.3 g/dL (ref 30.0–36.0)
MCV: 96.5 fL (ref 80.0–100.0)
Platelets: 409 10*3/uL — ABNORMAL HIGH (ref 150–400)
RBC: 3.15 MIL/uL — ABNORMAL LOW (ref 3.87–5.11)
RDW: 16.9 % — ABNORMAL HIGH (ref 11.5–15.5)
WBC: 9.3 10*3/uL (ref 4.0–10.5)
nRBC: 0 % (ref 0.0–0.2)

## 2023-02-07 LAB — GLUCOSE, CAPILLARY
Glucose-Capillary: 75 mg/dL (ref 70–99)
Glucose-Capillary: 70 mg/dL (ref 70–99)
Glucose-Capillary: 77 mg/dL (ref 70–99)
Glucose-Capillary: 81 mg/dL (ref 70–99)
Glucose-Capillary: 84 mg/dL (ref 70–99)

## 2023-02-07 LAB — PREALBUMIN: Prealbumin: 19 mg/dL (ref 18–38)

## 2023-02-07 LAB — AMMONIA: Ammonia: 32 umol/L (ref 9–35)

## 2023-02-07 MED ORDER — VORTIOXETINE HBR 20 MG PO TABS: 20.0000 mg | ORAL_TABLET | Freq: Every day | ORAL | Status: DC

## 2023-02-07 MED ORDER — FESOTERODINE FUMARATE ER 4 MG PO TB24: 4.0000 mg | ORAL_TABLET | Freq: Every day | ORAL | Status: DC

## 2023-02-07 MED ORDER — POTASSIUM CHLORIDE 10 MEQ/100ML IV SOLN
10.0000 meq | INTRAVENOUS | Status: AC
  Administered 2023-02-07 (×4): 10 meq via INTRAVENOUS
  Filled 2023-02-07 (×4): qty 100

## 2023-02-07 MED ORDER — DOXEPIN HCL 25 MG PO CAPS
75.0000 mg | ORAL_CAPSULE | Freq: Every day | ORAL | Status: DC
  Administered 2023-02-07: 75 mg via ORAL
  Filled 2023-02-07: qty 1
  Filled 2023-02-07 (×2): qty 3

## 2023-02-07 MED ORDER — BREXPIPRAZOLE 2 MG PO TABS
2.0000 mg | ORAL_TABLET | Freq: Every day | ORAL | Status: DC
  Administered 2023-02-07 – 2023-02-08 (×2): 2 mg via ORAL
  Filled 2023-02-07 (×2): qty 1

## 2023-02-07 NOTE — Progress Notes (Signed)
Nutrition Follow-up  DOCUMENTATION CODES:   Obesity unspecified  INTERVENTION:  - Continue Soft diet.   - Discontinue supplements.   NUTRITION DIAGNOSIS:   Inadequate oral intake related to lethargy/confusion as evidenced by NPO status.  GOAL:   Patient will meet greater than or equal to 90% of their needs  MONITOR:   Diet advancement, I & O's, Labs  REASON FOR ASSESSMENT:   Consult Assessment of nutrition requirement/status  ASSESSMENT:   Pt with hx of CAD, COPD, GERD, DM type 2, hypothyroidism, HTN, HLD, hx of roux-en-y gastric bypass 2016, and CKD3 presented to ED with abdominal pain with vomiting blood and dark stools for several days.  Meds reviewed:  dulcolax, sliding scale insulin, synthroid, MVI, Klor-con, Vit D. Labs reviewed: Na low, Creatinine high.   The pt has now been advanced to a soft diet. Pt reports that she has been tolerating full liquid diet well with no issues. Pt states that she does not like the supplements that are ordered. RD will discontinue them. The pt reports that she is ready for lunch. RD took lunch order and placed order in [facility]. RD will continue to monitor PO intakes.   Diet Order:   Diet Order             DIET SOFT Fluid consistency: Thin  Diet effective now                   EDUCATION NEEDS:   Not appropriate for education at this time  Skin:  Skin Assessment: Reviewed RN Assessment  Last BM:  8/26 - type 6  Height:   Ht Readings from Last 1 Encounters:  02/05/23 5\' 5"  (1.651 m)    Weight:   Wt Readings from Last 1 Encounters:  02/05/23 97.5 kg    Ideal Body Weight:  56.8 kg  BMI:  Body mass index is 35.78 kg/m.  Estimated Nutritional Needs:   Kcal:  1900-2100 kcal/d  Protein:  90-110 g/d  Fluid:  >/=2L/d  Bethann Humble, RD, LDN, CNSC.

## 2023-02-07 NOTE — Plan of Care (Signed)
  Problem: Education: Goal: Ability to verbalize activity precautions or restrictions will improve Outcome: Progressing Goal: Knowledge of the prescribed therapeutic regimen will improve Outcome: Progressing Goal: Understanding of discharge needs will improve Outcome: Progressing   Problem: Activity: Goal: Ability to avoid complications of mobility impairment will improve Outcome: Progressing Goal: Ability to tolerate increased activity will improve Outcome: Progressing Goal: Will remain free from falls Outcome: Progressing   Problem: Bowel/Gastric: Goal: Gastrointestinal status for postoperative course will improve Outcome: Progressing   Problem: Clinical Measurements: Goal: Ability to maintain clinical measurements within normal limits will improve Outcome: Progressing Goal: Postoperative complications will be avoided or minimized Outcome: Progressing Goal: Diagnostic test results will improve Outcome: Progressing   Problem: Pain Management: Goal: Pain level will decrease Outcome: Progressing   Problem: Skin Integrity: Goal: Will show signs of wound healing Outcome: Progressing   Problem: Health Behavior/Discharge Planning: Goal: Identification of resources available to assist in meeting health care needs will improve Outcome: Progressing   Problem: Bladder/Genitourinary: Goal: Urinary functional status for postoperative course will improve Outcome: Progressing   Problem: Education: Goal: Ability to describe self-care measures that may prevent or decrease complications (Diabetes Survival Skills Education) will improve Outcome: Progressing Goal: Individualized Educational Video(s) Outcome: Progressing   Problem: Coping: Goal: Ability to adjust to condition or change in health will improve Outcome: Progressing   Problem: Fluid Volume: Goal: Ability to maintain a balanced intake and output will improve Outcome: Progressing   Problem: Health Behavior/Discharge  Planning: Goal: Ability to identify and utilize available resources and services will improve Outcome: Progressing Goal: Ability to manage health-related needs will improve Outcome: Progressing   Problem: Metabolic: Goal: Ability to maintain appropriate glucose levels will improve Outcome: Progressing   Problem: Nutritional: Goal: Maintenance of adequate nutrition will improve Outcome: Progressing Goal: Progress toward achieving an optimal weight will improve Outcome: Progressing   Problem: Skin Integrity: Goal: Risk for impaired skin integrity will decrease Outcome: Progressing   Problem: Tissue Perfusion: Goal: Adequacy of tissue perfusion will improve Outcome: Progressing   Problem: Fluid Volume: Goal: Hemodynamic stability will improve Outcome: Progressing   Problem: Clinical Measurements: Goal: Diagnostic test results will improve Outcome: Progressing Goal: Signs and symptoms of infection will decrease Outcome: Progressing   Problem: Respiratory: Goal: Ability to maintain adequate ventilation will improve Outcome: Progressing   Problem: Education: Goal: Knowledge of General Education information will improve Description: Including pain rating scale, medication(s)/side effects and non-pharmacologic comfort measures Outcome: Progressing   Problem: Health Behavior/Discharge Planning: Goal: Ability to manage health-related needs will improve Outcome: Progressing   Problem: Clinical Measurements: Goal: Ability to maintain clinical measurements within normal limits will improve Outcome: Progressing Goal: Will remain free from infection Outcome: Progressing Goal: Diagnostic test results will improve Outcome: Progressing Goal: Respiratory complications will improve Outcome: Progressing Goal: Cardiovascular complication will be avoided Outcome: Progressing   Problem: Activity: Goal: Risk for activity intolerance will decrease Outcome: Progressing   Problem:  Nutrition: Goal: Adequate nutrition will be maintained Outcome: Progressing   Problem: Coping: Goal: Level of anxiety will decrease Outcome: Progressing   Problem: Elimination: Goal: Will not experience complications related to bowel motility Outcome: Progressing Goal: Will not experience complications related to urinary retention Outcome: Progressing   Problem: Pain Managment: Goal: General experience of comfort will improve Outcome: Progressing   Problem: Safety: Goal: Ability to remain free from injury will improve Outcome: Progressing   Problem: Skin Integrity: Goal: Risk for impaired skin integrity will decrease Outcome: Progressing

## 2023-02-07 NOTE — Progress Notes (Signed)
PROGRESS NOTE    Caroline Williams  AVW:[id number] DOB: Jul 06, 1960 DOA: 01/30/2023 PCP: Hoy Register, MD    Brief Narrative:   62 year old female with history of coronary artery disease, COPD, GERD, thyroid disease presented to the hospital after several days of abdominal pain, vomiting and dark stool. Not on blood thinners, no report of heavy alcohol use.  On initial presentation patient had hemoglobin of 3.0.  Patient was initially admitted to the intensive care unit for management of acute blood loss anemia secondary to GI bleed and underwent endoscopic evaluation on 01/31/2023 by GI.  Patient was subsequently considered stable for transfer out of the ICU.  During hospitalization patient continued to have melanotic stools show underwent colonoscopy with findings of ischemic colitis.  General surgery has been consulted and currently being treated conservatively with IV antibiotics and bowel rest.  Assessment and Plan:  Acute blood loss anemia Gastrointestinal hemorrhage with melena Patient has received multiple units of transfusion including 6 units PRBC, 1 unit each of platelets, FFP, cryoprecipitate during hospitalization.  Underwent EGD without obvious upper GI source.  Continued to have bright red blood and melanotic stools so GI was consulted and patient underwent colonoscopy evaluation 02/05/2023 with findings of ischemic colitis. General surgery consulted and patient is currently on liquid diet and IV Zosyn recommended Zosyn for total of 5 days.  IV fluids as well.  Latest hemoglobin of 9.5 from 8.6 and had dark stool yesterday.  Denies any abdominal pain nausea vomiting.  Await further surgical recommendations.   Hypophosphatemia, replenished.    Hypokalemia.  Borderline low potassium at 3.5.  Will continue to replenish.  UTI Completed course of Rocephin on 02/02/2023   AKI on CKD - Concern for ATN due to hypovolemia and profound anemia.  Latest creatinine of 1.5, slight uptrend  from 1.2.  Will continue with IV fluids.   Hypothyroidism Markedly elevated TSH and low T4.  Continue Synthroid,.  Discontinued hydrocortisone yesterday.  Acute urinary retention - On Foley catheter.  Will do voiding trial today.  Diabetes mellitus Continue sliding scale insulin, Accu-Cheks diabetic diet.  Continue to hold Farxiga   Coronary artery disease Imdur on hold.  No acute issues.   COPD Continue home umeclidinium-vilanterol.  Appears comfortable.   Acute metabolic encephalopathy - Secondary to blood loss and profound anemia.  Improved.  Avoid sedatives.   Chronic pain status post L4-5 decompression and fusion Patient is on  MS Contin and oxycodone at home.  Linzess on hold.  Has been started on Percocet at this time due to diffuse pain frequency has been increased..  Avoiding diclofenac gel for now.  Continue oxycodone for now.   History of mood disorder, bipolar 1, ADHD On Adderall, doxepin, Rexulti, Trintellix at home., all currently held in setting of encephalopathy.  Continue Xanax.  Will restart doxepin and Trintellix todayl;   vitamin D deficiency: Continue vitamin D 50,000 units p.o. weekly, follow with PCP to repeat vitamin D level after 3 to 6 months.  Debility, deconditioning.  PT has recommended CIR at this time.     DVT prophylaxis: SCDs Start: 01/31/23 0307   Code Status:     Code Status: Full Code  Disposition: CIR as per PT recommendation.  Status is: Inpatient  Remains inpatient appropriate because: Need for CIR, pending clinical improvement, IV Zosyn,   Family Communication:  Spoke with the patient's husband on the phone on 02/04/2023  Consultants:  PCCM GI  Procedures:  EGD on 01/31/2023 Transfusion of PRBC and blood  products Colonoscopy 02/05/2023  Antimicrobials:  Zosyn IV  Anti-infectives (From admission, onward)    Start     Dose/Rate Route Frequency Ordered Stop   02/05/23 1245  piperacillin-tazobactam (ZOSYN) IVPB 3.375 g         3.375 g 12.5 mL/hr over 240 Minutes Intravenous Every 8 hours 02/05/23 1158 02/10/23 1359   02/02/23 0400  metroNIDAZOLE (FLAGYL) IVPB 500 mg  Status:  Discontinued        500 mg 100 mL/hr over 60 Minutes Intravenous Every 12 hours 02/02/23 0231 02/04/23 1747   01/31/23 0000  cefTRIAXone (ROCEPHIN) 1 g in sodium chloride 0.9 % 100 mL IVPB  Status:  Discontinued        1 g 200 mL/hr over 30 Minutes Intravenous  Once 01/30/23 2352 01/30/23 2354   01/31/23 0000  cefTRIAXone (ROCEPHIN) 1 g in sodium chloride 0.9 % 100 mL IVPB        1 g 200 mL/hr over 30 Minutes Intravenous Every 24 hours 01/30/23 2354 02/02/23 0253       Subjective:  Today, patient was seen and examined at bedside.  Denies any pain in the abdomen, nausea vomiting fever chills or rigor.  Has had stool yesterday but no obvious red blood.    Objective: Vitals:   02/06/23 2210 02/07/23 0016 02/07/23 0501 02/07/23 0817  BP: (!) 151/88 (!) 152/95 (!) 151/102   Pulse: 82 80 90   Resp:  18 18   Temp: 97.7 F (36.5 C) 98 F (36.7 C) 97.8 F (36.6 C)   TempSrc: Oral Oral Oral   SpO2: 100% 100% 100% 98%  Weight:      Height:        Intake/Output Summary (Last 24 hours) at 02/07/2023 0836 Last data filed at 02/07/2023 0500 Gross per 24 hour  Intake --  Output 1600 ml  Net -1600 ml   Filed Weights   01/31/23 0649 01/31/23 0938 02/05/23 0837  Weight: 103.1 kg 103.1 kg 97.5 kg    Physical Examination: Body mass index is 35.78 kg/m.   General: Obese built, not in obvious distress, alert awake and Communicative. HENT: Mild pallor noted.  Oral mucosa moist. Chest:  .  Diminished breath sounds bilaterally. No crackles or wheezes.  CVS: S1 &S2 heard. No murmur.  Regular rate and rhythm. Abdomen: Soft nontender on palpation., bowel sounds heard.  Foley catheter in place. Extremities: No cyanosis, clubbing or edema.  Peripheral pulses are palpable. Psych: Alert, awake and oriented, normal mood. CNS:  No cranial  nerve deficits.  Power equal in all extremities.   Skin: Warm and dry.  No rashes noted.  Data Reviewed:   CBC: Recent Labs  Lab 02/02/23 0045 02/02/23 0327 02/02/23 0557 02/02/23 1141 02/03/23 0744 02/04/23 1749 02/05/23 0444 02/05/23 1750 02/06/23 0606 02/07/23 0634  WBC 11.3*  --  13.0* 13.6*  --   --   --   --  8.2 9.3  HGB 7.0*   < > 7.2* 8.1*   < > 8.8* 9.0* 9.1* 8.6*  TEST WILL BE CREDITED 9.5*  HCT 20.8*   < > 21.9* 24.1*   < > 26.4* 27.3* 27.7* 25.8*  TEST WILL BE CREDITED 30.4*  MCV 88.1  --  88.7 89.9  --   --   --   --  92.1 96.5  PLT 202  --  191 218  --   --   --   --  315 409*   < > = values  in this interval not displayed.    Basic Metabolic Panel: Recent Labs  Lab 02/02/23 1141 02/03/23 0744 02/04/23 0807 02/05/23 0444 02/06/23 0606 02/07/23 0634  NA  --  135 136 136 135 134*  K  --  3.1* 3.1* 3.9 2.9* 3.5  CL  --  104 103 104 104 104  CO2  --  21* 21* 20* 22 20*  GLUCOSE  --  100* 97 96 83 112*  BUN  --  25* 20 17 9  7*  CREATININE  --  1.13* 1.14* 1.01* 1.21* 1.56*  CALCIUM  --  7.4* 7.5* 7.7* 7.8* 8.1*  MG 2.2 2.2 2.0 2.2 2.0  --   PHOS 1.4* 2.2* 2.0* 1.7*  --   --     Liver Function Tests: Recent Labs  Lab 02/02/23 1141 02/07/23 0634  AST 35 24  ALT 32 27  ALKPHOS 58 58  BILITOT 0.5 0.6  PROT 5.8* 5.7*  ALBUMIN 2.3* 2.6*     Radiology Studies: CT Angio Abd/Pel w/ and/or w/o  Result Date: 02/05/2023 CLINICAL DATA:  Abdominal pain, evaluate for mesenteric ischemia, ulcerated mucosa identified by colonoscopy EXAM: CTA ABDOMEN AND PELVIS WITHOUT AND WITH CONTRAST TECHNIQUE: Multidetector CT imaging of the abdomen and pelvis was performed using the standard protocol during bolus administration of intravenous contrast. Multiplanar reconstructed images and MIPs were obtained and reviewed to evaluate the vascular anatomy. RADIATION DOSE REDUCTION: This exam was performed according to the departmental dose-optimization program which includes  automated exposure control, adjustment of the mA and/or kV according to patient size and/or use of iterative reconstruction technique. CONTRAST:  75mL OMNIPAQUE IOHEXOL 350 MG/ML SOLN COMPARISON:  02/01/2023 FINDINGS: VASCULAR Normal contour and caliber of the abdominal aorta. No evidence of aneurysm, dissection, or other acute aortic pathology. Replaced right hepatic artery arising from the superior mesenteric artery. Solitary bilateral renal arteries. Aortic branch vessels are widely patent, with specific attention to the superior and inferior mesenteric arteries. Mild aortic atherosclerosis. Review of the MIP images confirms the above findings. NON-VASCULAR Lower Chest: Small bilateral pleural effusions and associated atelectasis or consolidation. Hepatobiliary: No solid liver abnormality is seen. No gallstones, gallbladder wall thickening, or biliary dilatation. Pancreas: Unremarkable. No pancreatic ductal dilatation or surrounding inflammatory changes. Spleen: Normal in size without significant abnormality. Adrenals/Urinary Tract: Unchanged, definitively benign macroscopic fat containing right adrenal adenoma, for which no further follow-up or characterization is required. Similarly benign, non nodular adenomatous thickening of the left adrenal gland, requiring no further follow-up or characterization. Kidneys are normal, without renal calculi, solid lesion, or hydronephrosis. Urinary bladder decompressed by Foley catheter. Stomach/Bowel: Status post Roux type gastric bypass. Severe, long segment wall thickening of the distal colon extending from the distal transverse colon through the rectum. Lymphatic: No enlarged abdominal or pelvic lymph nodes. Reproductive: Status post hysterectomy. Other: No abdominal wall hernia or abnormality. Small volume ascites throughout the abdomen and pelvis. Musculoskeletal: No acute osseous findings. IMPRESSION: 1. Normal contour and caliber of the abdominal aorta. No evidence  of aneurysm, dissection, or other acute aortic pathology. Aortic branch vessels are widely patent, with specific attention to the superior and inferior mesenteric arteries. Mild aortic atherosclerosis 2. Severe, long segment wall thickening of the distal colon extending from the distal transverse colon through the rectum, consistent with colitis and similar to prior examination. 3. Small volume ascites, likely reactive. 4. Small bilateral pleural effusions and associated atelectasis or consolidation. 5. Status post Roux type gastric bypass. Aortic Atherosclerosis (ICD10-I70.0). Electronically Signed  By: Jearld Lesch M.D.   On: 02/05/2023 13:51      LOS: 7 days    Joycelyn Das, MD [facility] Available via Epic secure chat 7am-7pm After these hours, please refer to coverage provider listed on [email or web address] 02/07/2023, 8:36 AM

## 2023-02-07 NOTE — Progress Notes (Signed)
   Providing Compassionate, Quality Care - Together   Subjective: Neurosurgery asked to stop by to check the patient's surgical wound. No drainage, erythema, or edema reported.  Objective: Vital signs in last 24 hours: Temp:  [97.7 F (36.5 C)-98.5 F (36.9 C)] 98.5 F (36.9 C) (08/26 1723) Pulse Rate:  [66-90] 76 (08/26 1723) Resp:  [18] 18 (08/26 0854) BP: (145-152)/(67-102) 145/67 (08/26 1723) SpO2:  [98 %-100 %] 99 % (08/26 1723)  Intake/Output from previous day: 08/25 0701 - 08/26 0700 In: -  Out: 1600 [Urine:1600] Intake/Output this shift: Total I/O In: 1268.1 [P.O.:220; I.V.:600; IV Piggyback:448.1] Out: -   Alert and oriented x 4 PERRLA CN II-XII grossly intact MAE, Strength and sensation intact Incision is well-approximated, with some scabbing. No drainage noted.   Lab Results: Recent Labs    02/06/23 0606 02/07/23 0634  WBC 8.2 9.3  HGB 8.6*  TEST WILL BE CREDITED 9.5*  HCT 25.8*  TEST WILL BE CREDITED 30.4*  PLT 315 409*   BMET Recent Labs    02/06/23 0606 02/07/23 0634  NA 135 134*  K 2.9* 3.5  CL 104 104  CO2 22 20*  GLUCOSE 83 112*  BUN 9 7*  CREATININE 1.21* 1.56*  CALCIUM 7.8* 8.1*    Studies/Results: No results found.  Assessment/Plan: Patient underwent an L4-5 PLIF by Dr. Lovell Sheehan on 01/12/2023. She did well post operatively and was discharged home on 01/14/2023. She developed ischemic colitis and required blood transfusion and hospital admission. Her surgical wound is healing well. She has Vicryl sutures beneath the skin that will dissolve on their own. The surgical wound is fine to be open to air at this point.   LOS: 7 days     Val Eagle, DNP, AGNP-C Nurse Practitioner  [facility] [address], [address], [place], [place] [address] P: [phone]    F: [phone]  02/07/2023, 6:50 PM

## 2023-02-07 NOTE — Progress Notes (Signed)
[facility] Progress Note  SUBJECTIVE:   Interval history: Caroline Williams was seen and evaluated today at bedside. Resting seated at edge of bed. Denied current abdominal pain. Tolerated diet this AM with full liquids, eager to eat more solid foods. No nausea or vomiting. No chest pain or shortness of breath. Had BM directly prior to my arrival, loose without blood per nursing staff.   Past Medical History:  Diagnosis Date   Anxiety    Arthritis    Asthma    Bipolar 1 disorder (HCC)    Breast discharge    Breast lump    Breast pain    CAD (coronary artery disease) 12/03/2022    o Cath 2008: no CAD   o CCTA 10/05/19: CAC score 49, 88th percentile, nonobstructive CAD (LM 1-24 mid and dist)  o Myoview 12/31/20: EF 45, no ischemia or infarction, low risk    Chronic pain    on MS Contin and oxycodone   COPD (chronic obstructive pulmonary disease) (HCC)    Depression    Diabetes mellitus    diet and exercise controlled   Dysrhythmia    HR in 30s on metoprolol   Escherichia coli (E. coli) infection    Fever    GERD (gastroesophageal reflux disease)    History of chest pain    History of knee replacement, total    Hypertension    Hypothyroidism    N&V (nausea and vomiting)    Peripheral neuropathy    Right foot no sensation   Sciatica    Sleep apnea    does not use CPAP   Thyroid disease    Wears glasses    Past Surgical History:  Procedure Laterality Date   ABDOMINAL HYSTERECTOMY     partial   APPENDECTOMY     BIOPSY  03/14/2018   Procedure: BIOPSY;  Surgeon: Charlott Rakes, MD;  Location: [facility] ENDOSCOPY;  Service: Endoscopy;;   BIOPSY  02/05/2023   Procedure: BIOPSY;  Surgeon: Charlott Rakes, MD;  Location: [facility] ENDOSCOPY;  Service: Gastroenterology;;   BREAST DUCTAL SYSTEM EXCISION Right 08/31/2017   Procedure: RIGHT BREAST CENTRAL DUCT EXCISION;  Surgeon: Harriette Bouillon, MD;  Location: [facility];  Service: General;  Laterality: Right;    BREAST EXCISIONAL BIOPSY Right    BREAST LUMPECTOMY     right   CARDIAC CATHETERIZATION     no significant CAD, nl LV function by 11/08/06 cath   CESAREAN SECTION     x4   COLONOSCOPY WITH PROPOFOL N/A 03/14/2018   Procedure: COLONOSCOPY WITH PROPOFOL;  Surgeon: Charlott Rakes, MD;  Location: [facility] ENDOSCOPY;  Service: Endoscopy;  Laterality: N/A;   COLONOSCOPY WITH PROPOFOL Left 02/05/2023   Procedure: COLONOSCOPY WITH PROPOFOL;  Surgeon: Charlott Rakes, MD;  Location: [facility] ENDOSCOPY;  Service: Gastroenterology;  Laterality: Left;   ESOPHAGOGASTRODUODENOSCOPY (EGD) WITH PROPOFOL Left 01/31/2023   Procedure: ESOPHAGOGASTRODUODENOSCOPY (EGD) WITH PROPOFOL;  Surgeon: Willis Modena, MD;  Location: [facility] ENDOSCOPY;  Service: Gastroenterology;  Laterality: Left;   GASTRIC ROUX-EN-Y N/A 11/25/2014   Procedure: LAPAROSCOPIC ROUX-EN-Y GASTRIC BYPASS WITH UPPER ENDOSCOPY;  Surgeon: Luretha Murphy, MD;  Location: [facility] ORS;  Service: General;  Laterality: N/A;   HERNIA REPAIR     left inguinal   JOINT REPLACEMENT     KNEE SURGERY     right knee   MOUTH SURGERY     MYOMECTOMY ABDOMINAL APPROACH  1989   POLYPECTOMY  03/14/2018   Procedure: POLYPECTOMY;  Surgeon: Charlott Rakes, MD;  Location: [facility]  END[facility];  Service: Endoscopy;;   TOTAL KNEE REVISION  06/21/2012   Procedure: TOTAL KNEE REVISION;  Surgeon: Nadara Mustard, MD;  Location: [facility] OR;  Service: Orthopedics;  Laterality: Right;  Revision Right Total Knee Arthroplasty   Current Facility-Administered Medications  Medication Dose Route Frequency Provider Last Rate Last Admin   acetaminophen (TYLENOL) tablet 650 mg  650 mg Oral Q6H PRN Charlott Rakes, MD   650 mg at 02/03/23 2023   Or   acetaminophen (TYLENOL) suppository 650 mg  650 mg Rectal Q6H PRN Charlott Rakes, MD       albuterol (PROVENTIL) (2.5 MG/3ML) 0.083% nebulizer solution 2.5 mg  2.5 mg Nebulization Q2H PRN Charlott Rakes, MD       ALPRAZolam Prudy Feeler) tablet 1 mg  1 mg Oral  TID PRN Charlott Rakes, MD   1 mg at 02/06/23 2200   alum & mag hydroxide-simeth (MAALOX/MYLANTA) 200-200-20 MG/5ML suspension 30 mL  30 mL Oral Q6H PRN Karie Soda, MD       bisacodyl (DULCOLAX) suppository 10 mg  10 mg Rectal Once Charlott Rakes, MD       brexpiprazole (REXULTI) tablet 2 mg  2 mg Oral Daily Pokhrel, Laxman, MD   2 mg at 02/07/23 1005   Chlorhexidine Gluconate Cloth 2 % PADS 6 each  6 each Topical Q0600 Charlott Rakes, MD   6 each at 02/07/23 0505   dextrose 5 % bolus 250 mL  250 mL Intravenous PRN Charlott Rakes, MD       doxepin (SINEQUAN) capsule 75 mg  75 mg Oral QHS Pokhrel, Laxman, MD       feeding supplement (BOOST / RESOURCE BREEZE) liquid 1 Container  1 Container Oral TID BM Charlott Rakes, MD   1 Container at 02/06/23 1032   feeding supplement (ENSURE PRE-SURGERY) liquid 296 mL  296 mL Oral Once Karie Soda, MD       fesoterodine (TOVIAZ) tablet 4 mg  4 mg Oral Daily Pokhrel, Laxman, MD       hydrALAZINE (APRESOLINE) injection 10 mg  10 mg Intravenous Q6H PRN Charlott Rakes, MD   10 mg at 02/04/23 1837   insulin aspart (novoLOG) injection 0-9 Units  0-9 Units Subcutaneous Q4H Charlott Rakes, MD   1 Units at 02/05/23 2122   lactated ringers bolus 1,000 mL  1,000 mL Intravenous Q8H PRN Karie Soda, MD       lactated ringers infusion   Intravenous Continuous Pokhrel, Laxman, MD 100 mL/hr at 02/07/23 1024 New Bag at 02/07/23 1024   levothyroxine (SYNTHROID) tablet 150 mcg  150 mcg Oral Q0600 Charlott Rakes, MD   150 mcg at 02/07/23 1610   magic mouthwash  15 mL Oral QID PRN Karie Soda, MD       menthol-cetylpyridinium (CEPACOL) lozenge 3 mg  1 lozenge Oral PRN Karie Soda, MD       methocarbamol (ROBAXIN) 1,000 mg in dextrose 5 % 100 mL IVPB  1,000 mg Intravenous Q6H PRN Karie Soda, MD       methocarbamol (ROBAXIN) tablet 1,000 mg  1,000 mg Oral Q6H PRN Karie Soda, MD       multivitamin with minerals tablet 1 tablet  1 tablet Oral  BID Charlott Rakes, MD   1 tablet at 02/07/23 9604   naloxone [facility]) injection 0.4 mg  0.4 mg Intravenous PRN Charlott Rakes, MD   0.4 mg at 01/31/23 0102   naphazoline-glycerin (CLEAR EYES REDNESS) ophth solution 1-2 drop  1-2 drop Both Eyes QID PRN  Karie Soda, MD       nicotine (NICODERM CQ - dosed in mg/24 hr) patch 7 mg  7 mg Transdermal Daily Charlott Rakes, MD   7 mg at 02/07/23 0925   ondansetron (ZOFRAN) injection 4 mg  4 mg Intravenous Q6H PRN Karie Soda, MD       Or   ondansetron (ZOFRAN) 8 mg in sodium chloride 0.9 % 50 mL IVPB  8 mg Intravenous Q6H PRN Karie Soda, MD       ondansetron [facility]) tablet 4 mg  4 mg Oral Q6H PRN Charlott Rakes, MD       oxyCODONE-acetaminophen (PERCOCET/ROXICET) 5-325 MG per tablet 1 tablet  1 tablet Oral Q4H PRN Pokhrel, Laxman, MD   1 tablet at 02/06/23 2200   pantoprazole (PROTONIX) EC tablet 40 mg  40 mg Oral BID Charlott Rakes, MD   40 mg at 02/07/23 0918   phenol (CHLORASEPTIC) mouth spray 2 spray  2 spray Mouth/Throat PRN Karie Soda, MD       piperacillin-tazobactam (ZOSYN) IVPB 3.375 g  3.375 g Intravenous Trixie Deis, MD 12.5 mL/hr at 02/07/23 0539 3.375 g at 02/07/23 0539   polycarbophil (FIBERCON) tablet 625 mg  625 mg Oral BID Karie Soda, MD   625 mg at 02/07/23 4098   potassium chloride 10 mEq in 100 mL IVPB  10 mEq Intravenous Q1 Hr x 4 Pokhrel, Laxman, MD 100 mL/hr at 02/07/23 1024 10 mEq at 02/07/23 1024   potassium chloride SA (KLOR-CON M) CR tablet 40 mEq  40 mEq Oral Daily Karie Soda, MD   40 mEq at 02/07/23 1191   prochlorperazine (COMPAZINE) injection 5-10 mg  5-10 mg Intravenous Q4H PRN Karie Soda, MD       simethicone (MYLICON) 40 MG/0.6ML suspension 80 mg  80 mg Oral QID PRN Karie Soda, MD       simethicone (MYLICON) chewable tablet 80 mg  80 mg Oral QID Karie Soda, MD   80 mg at 02/07/23 4782   sodium chloride (OCEAN) 0.65 % nasal spray 1-2 spray  1-2 spray Each Nare Q6H PRN Karie Soda, MD       umeclidinium-vilanterol (ANORO ELLIPTA) 62.5-25 MCG/ACT 1 puff  1 puff Inhalation Daily Charlott Rakes, MD   1 puff at 02/07/23 0815   Vitamin D (Ergocalciferol) (DRISDOL) 1.25 MG (50000 UNIT) capsule 50,000 Units  50,000 Units Oral Q7 days Charlott Rakes, MD   50,000 Units at 02/02/23 1635   vortioxetine HBr (TRINTELLIX) tablet 20 mg  20 mg Oral Daily Pokhrel, Laxman, MD       white petrolatum (VASELINE) gel   Topical PRN Charlott Rakes, MD       Allergies as of 01/30/2023 - Reviewed 01/30/2023  Allergen Reaction Noted   Aspirin Other (See Comments), Shortness Of Breath, and Anaphylaxis 01/18/2011   Bee venom Anaphylaxis 01/29/2019   Sulfa antibiotics Anaphylaxis 06/21/2012   Evolocumab  06/02/2021   Other Other (See Comments) 11/21/2014   Review of Systems:  Review of Systems  Respiratory:  Negative for shortness of breath.   Cardiovascular:  Negative for chest pain.  Gastrointestinal:  Negative for abdominal pain, blood in stool, nausea and vomiting.    OBJECTIVE:   Temp:  [97.7 F (36.5 C)-98.5 F (36.9 C)] 97.9 F (36.6 C) (08/26 0854) Pulse Rate:  [66-94] 66 (08/26 0854) Resp:  [18] 18 (08/26 0854) BP: (137-152)/(82-102) 152/82 (08/26 0854) SpO2:  [98 %-100 %] 100 % (08/26 0854) Last BM Date : 02/07/23 Physical  Exam Constitutional:      General: She is not in acute distress.    Appearance: She is not ill-appearing, toxic-appearing or diaphoretic.  Cardiovascular:     Rate and Rhythm: Normal rate and regular rhythm.  Pulmonary:     Effort: No respiratory distress.     Breath sounds: Normal breath sounds.  Abdominal:     General: Bowel sounds are normal. There is no distension.     Palpations: Abdomen is soft.     Tenderness: There is no abdominal tenderness. There is no guarding.  Neurological:     Mental Status: She is alert.     Labs: Recent Labs    02/05/23 1750 02/06/23 0606 02/07/23 0634  WBC  --  8.2 9.3  HGB 9.1* 8.6*  TEST  WILL BE CREDITED 9.5*  HCT 27.7* 25.8*  TEST WILL BE CREDITED 30.4*  PLT  --  315 409*   BMET Recent Labs    02/05/23 0444 02/06/23 0606 02/07/23 0634  NA 136 135 134*  K 3.9 2.9* 3.5  CL 104 104 104  CO2 20* 22 20*  GLUCOSE 96 83 112*  BUN 17 9 7*  CREATININE 1.01* 1.21* 1.56*  CALCIUM 7.7* 7.8* 8.1*   LFT Recent Labs    02/07/23 0634  PROT 5.7*  ALBUMIN 2.6*  AST 24  ALT 27  ALKPHOS 58  BILITOT 0.6   PT/INR No results for input(s): "LABPROT", "INR" in the last 72 hours. Diagnostic imaging: CT Angio Abd/Pel w/ and/or w/o  Result Date: 02/05/2023 CLINICAL DATA:  Abdominal pain, evaluate for mesenteric ischemia, ulcerated mucosa identified by colonoscopy EXAM: CTA ABDOMEN AND PELVIS WITHOUT AND WITH CONTRAST TECHNIQUE: Multidetector CT imaging of the abdomen and pelvis was performed using the standard protocol during bolus administration of intravenous contrast. Multiplanar reconstructed images and MIPs were obtained and reviewed to evaluate the vascular anatomy. RADIATION DOSE REDUCTION: This exam was performed according to the departmental dose-optimization program which includes automated exposure control, adjustment of the mA and/or kV according to patient size and/or use of iterative reconstruction technique. CONTRAST:  75mL OMNIPAQUE IOHEXOL 350 MG/ML SOLN COMPARISON:  02/01/2023 FINDINGS: VASCULAR Normal contour and caliber of the abdominal aorta. No evidence of aneurysm, dissection, or other acute aortic pathology. Replaced right hepatic artery arising from the superior mesenteric artery. Solitary bilateral renal arteries. Aortic branch vessels are widely patent, with specific attention to the superior and inferior mesenteric arteries. Mild aortic atherosclerosis. Review of the MIP images confirms the above findings. NON-VASCULAR Lower Chest: Small bilateral pleural effusions and associated atelectasis or consolidation. Hepatobiliary: No solid liver abnormality is seen. No  gallstones, gallbladder wall thickening, or biliary dilatation. Pancreas: Unremarkable. No pancreatic ductal dilatation or surrounding inflammatory changes. Spleen: Normal in size without significant abnormality. Adrenals/Urinary Tract: Unchanged, definitively benign macroscopic fat containing right adrenal adenoma, for which no further follow-up or characterization is required. Similarly benign, non nodular adenomatous thickening of the left adrenal gland, requiring no further follow-up or characterization. Kidneys are normal, without renal calculi, solid lesion, or hydronephrosis. Urinary bladder decompressed by Foley catheter. Stomach/Bowel: Status post Roux type gastric bypass. Severe, long segment wall thickening of the distal colon extending from the distal transverse colon through the rectum. Lymphatic: No enlarged abdominal or pelvic lymph nodes. Reproductive: Status post hysterectomy. Other: No abdominal wall hernia or abnormality. Small volume ascites throughout the abdomen and pelvis. Musculoskeletal: No acute osseous findings. IMPRESSION: 1. Normal contour and caliber of the abdominal aorta. No evidence of aneurysm, dissection,  or other acute aortic pathology. Aortic branch vessels are widely patent, with specific attention to the superior and inferior mesenteric arteries. Mild aortic atherosclerosis 2. Severe, long segment wall thickening of the distal colon extending from the distal transverse colon through the rectum, consistent with colitis and similar to prior examination. 3. Small volume ascites, likely reactive. 4. Small bilateral pleural effusions and associated atelectasis or consolidation. 5. Status post Roux type gastric bypass. Aortic Atherosclerosis (ICD10-I70.0). Electronically Signed   By: Jearld Lesch M.D.   On: 02/05/2023 13:51    IMPRESSION: Left sided ischemic colitis History Roux-en-Y gastric bypass COPD  PLAN: -Appears clinically improved today, ok to advance diet from GI  standpoint -Follow up remaining biopsies from colonoscopy on 02/05/23 -Appreciate General Surgery evaluation/management -[facility] will respectfully sign off and be available as needed   LOS: 7 days   Liliane Shi, [facility]

## 2023-02-07 NOTE — Progress Notes (Addendum)
OT Cancellation Note  Patient Details Name: Caroline Williams MRN: [id number] DOB: 04-20-1961   Cancelled Treatment:    Reason Eval/Treat Not Completed: Other (comment) (Pt on the phone paying bills upon arrival, asking therapist to come back. OT to follow up as schedule allows.)  1604 Addendum: Pt now declining due to an upset stomach, maximal education and encouragement provided, pt continued to decline. OT to follow up at a later date.   Donia Pounds 02/07/2023, 12:54 PM

## 2023-02-07 NOTE — Progress Notes (Signed)
Patient ID: Caroline Williams, female   DOB: 1961-05-15, 62 y.o.   MRN: [id number] [facility] Surgery Progress Note  2 Days Post-Op  Subjective: CC-  Feeling much better today. States that she has no abdominal pain. Denies n/v. Tolerating full liquids without increased pain. Passing flatus and had a BM this morning; stool is still dark but she states it is not as dark.  Objective: Vital signs in last 24 hours: Temp:  [97.7 F (36.5 C)-98.5 F (36.9 C)] 97.9 F (36.6 C) (08/26 0854) Pulse Rate:  [66-94] 66 (08/26 0854) Resp:  [18] 18 (08/26 0854) BP: (137-152)/(82-102) 152/82 (08/26 0854) SpO2:  [98 %-100 %] 100 % (08/26 0854) Last BM Date : 02/05/23  Intake/Output from previous day: 08/25 0701 - 08/26 0700 In: -  Out: 1600 [Urine:1600] Intake/Output this shift: Total I/O In: 220 [P.O.:220] Out: -   PE: Gen:  Alert, NAD, pleasant Abd: soft, ND, NT  Lab Results:  Recent Labs    02/06/23 0606 02/07/23 0634  WBC 8.2 9.3  HGB 8.6*  TEST WILL BE CREDITED 9.5*  HCT 25.8*  TEST WILL BE CREDITED 30.4*  PLT 315 409*   BMET Recent Labs    02/06/23 0606 02/07/23 0634  NA 135 134*  K 2.9* 3.5  CL 104 104  CO2 22 20*  GLUCOSE 83 112*  BUN 9 7*  CREATININE 1.21* 1.56*  CALCIUM 7.8* 8.1*   PT/INR No results for input(s): "LABPROT", "INR" in the last 72 hours. CMP     Component Value Date/Time   NA 134 (L) 02/07/2023 0634   NA 138 10/07/2022 1443   K 3.5 02/07/2023 0634   CL 104 02/07/2023 0634   CO2 20 (L) 02/07/2023 0634   GLUCOSE 112 (H) 02/07/2023 0634   BUN 7 (L) 02/07/2023 0634   BUN 19 10/07/2022 1443   CREATININE 1.56 (H) 02/07/2023 0634   CALCIUM 8.1 (L) 02/07/2023 0634   PROT 5.7 (L) 02/07/2023 0634   PROT 7.4 10/07/2022 1443   ALBUMIN 2.6 (L) 02/07/2023 0634   ALBUMIN 4.5 10/07/2022 1443   AST 24 02/07/2023 0634   ALT 27 02/07/2023 0634   ALKPHOS 58 02/07/2023 0634   BILITOT 0.6 02/07/2023 0634   BILITOT 0.2 10/07/2022 1443   GFRNONAA 37  (L) 02/07/2023 0634   GFRAA 60 07/14/2020 0754   Lipase     Component Value Date/Time   LIPASE 73 (H) 01/30/2023 1933       Studies/Results: CT Angio Abd/Pel w/ and/or w/o  Result Date: 02/05/2023 CLINICAL DATA:  Abdominal pain, evaluate for mesenteric ischemia, ulcerated mucosa identified by colonoscopy EXAM: CTA ABDOMEN AND PELVIS WITHOUT AND WITH CONTRAST TECHNIQUE: Multidetector CT imaging of the abdomen and pelvis was performed using the standard protocol during bolus administration of intravenous contrast. Multiplanar reconstructed images and MIPs were obtained and reviewed to evaluate the vascular anatomy. RADIATION DOSE REDUCTION: This exam was performed according to the departmental dose-optimization program which includes automated exposure control, adjustment of the mA and/or kV according to patient size and/or use of iterative reconstruction technique. CONTRAST:  75mL OMNIPAQUE IOHEXOL 350 MG/ML SOLN COMPARISON:  02/01/2023 FINDINGS: VASCULAR Normal contour and caliber of the abdominal aorta. No evidence of aneurysm, dissection, or other acute aortic pathology. Replaced right hepatic artery arising from the superior mesenteric artery. Solitary bilateral renal arteries. Aortic branch vessels are widely patent, with specific attention to the superior and inferior mesenteric arteries. Mild aortic atherosclerosis. Review of the MIP images confirms the above  findings. NON-VASCULAR Lower Chest: Small bilateral pleural effusions and associated atelectasis or consolidation. Hepatobiliary: No solid liver abnormality is seen. No gallstones, gallbladder wall thickening, or biliary dilatation. Pancreas: Unremarkable. No pancreatic ductal dilatation or surrounding inflammatory changes. Spleen: Normal in size without significant abnormality. Adrenals/Urinary Tract: Unchanged, definitively benign macroscopic fat containing right adrenal adenoma, for which no further follow-up or characterization is  required. Similarly benign, non nodular adenomatous thickening of the left adrenal gland, requiring no further follow-up or characterization. Kidneys are normal, without renal calculi, solid lesion, or hydronephrosis. Urinary bladder decompressed by Foley catheter. Stomach/Bowel: Status post Roux type gastric bypass. Severe, long segment wall thickening of the distal colon extending from the distal transverse colon through the rectum. Lymphatic: No enlarged abdominal or pelvic lymph nodes. Reproductive: Status post hysterectomy. Other: No abdominal wall hernia or abnormality. Small volume ascites throughout the abdomen and pelvis. Musculoskeletal: No acute osseous findings. IMPRESSION: 1. Normal contour and caliber of the abdominal aorta. No evidence of aneurysm, dissection, or other acute aortic pathology. Aortic branch vessels are widely patent, with specific attention to the superior and inferior mesenteric arteries. Mild aortic atherosclerosis 2. Severe, long segment wall thickening of the distal colon extending from the distal transverse colon through the rectum, consistent with colitis and similar to prior examination. 3. Small volume ascites, likely reactive. 4. Small bilateral pleural effusions and associated atelectasis or consolidation. 5. Status post Roux type gastric bypass. Aortic Atherosclerosis (ICD10-I70.0). Electronically Signed   By: Jearld Lesch M.D.   On: 02/05/2023 13:51    Anti-infectives: Anti-infectives (From admission, onward)    Start     Dose/Rate Route Frequency Ordered Stop   02/05/23 1245  piperacillin-tazobactam (ZOSYN) IVPB 3.375 g        3.375 g 12.5 mL/hr over 240 Minutes Intravenous Every 8 hours 02/05/23 1158 02/10/23 1359   02/02/23 0400  metroNIDAZOLE (FLAGYL) IVPB 500 mg  Status:  Discontinued        500 mg 100 mL/hr over 60 Minutes Intravenous Every 12 hours 02/02/23 0231 02/04/23 1747   01/31/23 0000  cefTRIAXone (ROCEPHIN) 1 g in sodium chloride 0.9 % 100 mL  IVPB  Status:  Discontinued        1 g 200 mL/hr over 30 Minutes Intravenous  Once 01/30/23 2352 01/30/23 2354   01/31/23 0000  cefTRIAXone (ROCEPHIN) 1 g in sodium chloride 0.9 % 100 mL IVPB        1 g 200 mL/hr over 30 Minutes Intravenous Every 24 hours 01/30/23 2354 02/02/23 0253        Assessment/Plan GI bleed, melena Ischemic colitis  - s/p colonoscopy 8/24, biopsies pending - WBC normalized and abdominal exam benign today. H/h stable. Advance to soft diet. Continue IV antibiotics. No indication for acute surgical intervention today.  ID - zosyn 8/24>> FEN - soft diet VTE - SCDs  Hx gastric bypass AKI on CKD Chronic back pain requiring narcotics DM CAD Hypothyroidism Hx mood disorder, Bipolar 1, ADHD  I reviewed hospitalist notes, last 24 h vitals and pain scores, last 48 h intake and output, and last 24 h labs and trends.    LOS: 7 days    Franne Forts, [facility] Surgery 02/07/2023, 9:57 AM Please see Amion for pager number during day hours 7:00am-4:30pm

## 2023-02-08 DIAGNOSIS — K55039 Acute (reversible) ischemia of large intestine, extent unspecified: Secondary | ICD-10-CM | POA: Diagnosis not present

## 2023-02-08 DIAGNOSIS — K922 Gastrointestinal hemorrhage, unspecified: Secondary | ICD-10-CM | POA: Diagnosis not present

## 2023-02-08 DIAGNOSIS — E038 Other specified hypothyroidism: Secondary | ICD-10-CM | POA: Diagnosis not present

## 2023-02-08 DIAGNOSIS — G9341 Metabolic encephalopathy: Secondary | ICD-10-CM | POA: Diagnosis not present

## 2023-02-08 DIAGNOSIS — G8929 Other chronic pain: Secondary | ICD-10-CM

## 2023-02-08 LAB — BASIC METABOLIC PANEL
Anion gap: 8 (ref 5–15)
BUN: <5 mg/dL — ABNORMAL LOW (ref 8–23)
CO2: 21 mmol/L — ABNORMAL LOW (ref 22–32)
Calcium: 8.2 mg/dL — ABNORMAL LOW (ref 8.9–10.3)
Chloride: 107 mmol/L (ref 98–111)
Creatinine, Ser: 1.21 mg/dL — ABNORMAL HIGH (ref 0.44–1.00)
GFR, Estimated: 51 mL/min — ABNORMAL LOW (ref >60)
Glucose, Bld: 83 mg/dL (ref 70–99)
Potassium: 3.6 mmol/L (ref 3.5–5.1)
Sodium: 136 mmol/L (ref 135–145)

## 2023-02-08 LAB — MAGNESIUM: Magnesium: 1.9 mg/dL (ref 1.7–2.4)

## 2023-02-08 LAB — CBC
HCT: 26.1 % — ABNORMAL LOW (ref 36.0–46.0)
Hemoglobin: 8.6 g/dL — ABNORMAL LOW (ref 12.0–15.0)
MCH: 31.3 pg (ref 26.0–34.0)
MCHC: 33 g/dL (ref 30.0–36.0)
MCV: 94.9 fL (ref 80.0–100.0)
Platelets: 496 10*3/uL — ABNORMAL HIGH (ref 150–400)
RBC: 2.75 MIL/uL — ABNORMAL LOW (ref 3.87–5.11)
RDW: 16.5 % — ABNORMAL HIGH (ref 11.5–15.5)
WBC: 9.1 10*3/uL (ref 4.0–10.5)
nRBC: 0 % (ref 0.0–0.2)

## 2023-02-08 LAB — GLUCOSE, CAPILLARY
Glucose-Capillary: 102 mg/dL — ABNORMAL HIGH (ref 70–99)
Glucose-Capillary: 84 mg/dL (ref 70–99)
Glucose-Capillary: 168 mg/dL — ABNORMAL HIGH (ref 70–99)
Glucose-Capillary: 92 mg/dL (ref 70–99)

## 2023-02-08 MED ORDER — NICOTINE 7 MG/24HR TD PT24: 7.0000 mg | MEDICATED_PATCH | Freq: Every day | TRANSDERMAL | 0 refills | Status: DC

## 2023-02-08 MED ORDER — ONDANSETRON HCL 4 MG PO TABS: 4.0000 mg | ORAL_TABLET | Freq: Four times a day (QID) | ORAL | 0 refills | Status: DC | PRN

## 2023-02-08 MED ORDER — ROPINIROLE HCL 2 MG PO TABS: 2.0000 mg | ORAL_TABLET | Freq: Every day | ORAL | 0 refills | Status: DC

## 2023-02-08 MED ORDER — CALCIUM POLYCARBOPHIL 625 MG PO TABS: 625.0000 mg | ORAL_TABLET | Freq: Two times a day (BID) | ORAL | 0 refills | Status: DC

## 2023-02-08 MED ORDER — CALCIUM POLYCARBOPHIL 625 MG PO TABS: 625.0000 mg | ORAL_TABLET | Freq: Two times a day (BID) | ORAL | 0 refills | Status: AC

## 2023-02-08 MED ORDER — VITAMIN D (ERGOCALCIFEROL) 1.25 MG (50000 UNIT) PO CAPS: 50000.0000 [IU] | ORAL_CAPSULE | ORAL | 0 refills | Status: DC

## 2023-02-08 MED ORDER — ROPINIROLE HCL 1 MG PO TABS
2.0000 mg | ORAL_TABLET | Freq: Every day | ORAL | Status: DC
  Administered 2023-02-08: 2 mg via ORAL
  Filled 2023-02-08: qty 2

## 2023-02-08 NOTE — Discharge Summary (Signed)
Physician Discharge Summary  Caroline Williams JXB:[id number] DOB: 1961-02-19 DOA: 01/30/2023  PCP: Hoy Register, MD  Admit date: 01/30/2023 Discharge date: 02/08/2023  Admitted From: Home  Discharge disposition: Home with home health  Recommendations for Outpatient Follow-Up:   Follow up with your primary care provider in one week.  Check CBC, BMP, magnesium in the next visit Follow-up with GI as outpatient.  Ambulatory GI referral has been made. Check thyroid function test in 4 to 6 weeks and adjust Synthroid dose as necessary. Check vitamin D levels in 3 to 6 months  Discharge Diagnosis:   Principal Problem:   Acute ischemic colitis (HCC) Active Problems:   Hypothyroidism   Morbid obesity (HCC)   Diabetes mellitus with neuropathy (HCC)   History of Roux-en-Y gastric bypass 2016   COPD mixed type (HCC)   Hyperlipidemia   Essential (primary) hypertension   Other chronic pain   CAD (coronary artery disease)   UTI (urinary tract infection)   CKD (chronic kidney disease) stage 3, GFR 30-59 ml/min (HCC)   AKI (acute kidney injury) (HCC)   Acute metabolic encephalopathy   ABLA (acute blood loss anemia)   Gastrointestinal hemorrhage with melena   Hypokalemia   Acute GI bleeding   Discharge Condition: Improved.  Diet recommendation: Low sodium, heart healthy.  Carbohydrate-modified.    Wound care: None.  Code status: Full.  History of Present Illness:   62 year old female with history of coronary artery disease, COPD, GERD, thyroid disease presented to the hospital after several days of abdominal pain, vomiting and dark stool. Not on blood thinners, no report of heavy alcohol use.  On initial presentation patient had hemoglobin of 3.0.  Patient was initially admitted to the intensive care unit for management of acute blood loss anemia secondary to GI bleed and underwent endoscopic evaluation on 01/31/2023 by GI.  Patient was subsequently considered stable for transfer  out of the ICU.   Hospital Course:   Following conditions were addressed during hospitalization as listed below,  Acute blood loss anemia Gastrointestinal hemorrhage with melena Patient received multiple units of transfusion including 6 units PRBC, 1 unit each of platelets, FFP, cryoprecipitate during hospitalization.  Underwent EGD without obvious upper GI source.  Continued to have bright red blood and melanotic stools so GI was consulted and patient underwent colonoscopy evaluation on 02/05/2023 with findings of ischemic colitis. General surgery was consulted and patient was treated conservatively with bowel rest liquid diet and IV Zosyn with significant improvement in her symptoms.  This time GI and general surgery has signed off. hemoglobin prior to discharge was  8.6. .    Hypophosphatemia, replenished.     Hypokalemia.  improved after replacement.   UTI Completed course of Rocephin on 02/02/2023   AKI on CKD - Concern for ATN due to hypovolemia and profound anemia.  Latest creatinine of 1.2, improved with IV fluids.   Hypothyroidism Markedly elevated TSH and low T4.  Continue Synthroid, might need adjustment of Synthroid dose and thyroid function test follow-up as outpatient.   Acute urinary retention Resolved, off Foley catheter.  Was able to urinate on her own.   Diabetes mellitus type II. Continue Farxiga   Coronary artery disease Continue Imdur   COPD Continue home umeclidinium-vilanterol.     Acute metabolic encephalopathy - Secondary to blood loss and profound anemia.  Improved.     Chronic pain status post L4-5 decompression and fusion Patient is on  MS Contin and oxycodone at home.  Resume on  discharge.  History of mood disorder, bipolar 1, ADHD On Adderall, doxepin, Rexulti, Trintellix at home.  Resumed on discharge.  Restless leg syndrome.  Used to be on Requip in the past.  Will try low-dose Requip 2 mg at nighttime on discharge.   vitamin D deficiency:  Continue vitamin D 50,000 units p.o. weekly, follow with PCP to repeat vitamin D level after 3 to 6 months.   Debility, deconditioning.  PT recommended inpatient rehab but this has been denied at this time and patient will be going home with home health PT on discharge.   Disposition.  At this time, patient is stable for disposition home with home health PT.  Medical Consultants:   PCCM  GI  Procedures:    EGD on 01/31/2023 Transfusion of PRBC and blood products Colonoscopy 02/05/2023 Subjective:   Today, patient was seen and examined at bedside.  Feels okay.  No nausea vomiting fever chills.  Had bowel movements without any blood.  No abdominal pain.  Seen by GI and general surgery and okay for discharge.  Discharge Exam:   Vitals:   02/08/23 0824 02/08/23 0835  BP: (!) 155/92   Pulse: (!) 101   Resp: 17   Temp: 98.3 F (36.8 C)   SpO2: 100% 99%   Vitals:   02/07/23 2003 02/08/23 0457 02/08/23 0824 02/08/23 0835  BP: (!) 157/108 (!) 160/94 (!) 155/92   Pulse: 89 92 (!) 101   Resp: 19 16 17    Temp: 98.4 F (36.9 C) 97.8 F (36.6 C) 98.3 F (36.8 C)   TempSrc: Oral  Oral   SpO2: 97% 100% 100% 99%  Weight:      Height:       Body mass index is 35.78 kg/m.  General: Alert awake, not in obvious distress, obese HENT: pupils equally reacting to light, mild pallor noted.  Oral mucosa is moist.  Chest:  Clear breath sounds.  Diminished breath sounds bilaterally. No crackles or wheezes.  CVS: S1 &S2 heard. No murmur.  Regular rate and rhythm. Abdomen: Soft, nontender, nondistended.  Bowel sounds are heard.   Extremities: No cyanosis, clubbing or edema.  Peripheral pulses are palpable. Psych: Alert, awake and oriented, normal mood CNS:  No cranial nerve deficits.  Power equal in all extremities.   Skin: Warm and dry.  No rashes noted.  The results of significant diagnostics from this hospitalization (including imaging, microbiology, ancillary and laboratory) are listed  below for reference.     Diagnostic Studies:   CT ANGIO GI BLEED  Result Date: 01/30/2023 CLINICAL DATA:  Bloody diarrhea. Concern for acute GI bleed or mesenteric ischemia. EXAM: CTA ABDOMEN AND PELVIS WITHOUT AND WITH CONTRAST TECHNIQUE: Multidetector CT imaging of the abdomen and pelvis was performed using the standard protocol during bolus administration of intravenous contrast. Multiplanar reconstructed images and MIPs were obtained and reviewed to evaluate the vascular anatomy. RADIATION DOSE REDUCTION: This exam was performed according to the departmental dose-optimization program which includes automated exposure control, adjustment of the mA and/or kV according to patient size and/or use of iterative reconstruction technique. CONTRAST:  75mL OMNIPAQUE IOHEXOL 350 MG/ML SOLN COMPARISON:  09/10/2021 FINDINGS: VASCULAR Aorta: Normal caliber aorta without aneurysm, dissection, vasculitis or significant stenosis. Scattered aortic calcifications. Celiac: Patent without evidence of aneurysm, dissection, vasculitis or significant stenosis. SMA: Patent without evidence of aneurysm, dissection, vasculitis or significant stenosis. Renals: Both renal arteries are patent without evidence of aneurysm, dissection, vasculitis, fibromuscular dysplasia or significant stenosis. IMA: Patent without evidence  of aneurysm, dissection, vasculitis or significant stenosis. Inflow: Atherosclerotic calcifications. No aneurysm or dissection. No stenosis. Proximal Outflow: Bilateral common femoral and visualized portions of the superficial and profunda femoral arteries are patent without evidence of aneurysm, dissection, vasculitis or significant stenosis. Veins: No obvious venous abnormality within the limitations of this arterial phase study. Review of the MIP images confirms the above findings. NON-VASCULAR Lower chest: No acute findings Hepatobiliary: Stable left hepatic cyst which appears benign. No suspicious focal hepatic  abnormality. Gallbladder unremarkable. Pancreas: No focal abnormality or ductal dilatation. Spleen: No focal abnormality.  Normal size. Adrenals/Urinary Tract: Diffuse enlargement of the left adrenal gland. Right adrenal nodule is unchanged. No renal or ureteral stones. No renal or ureteral stones. No hydronephrosis. Urinary bladder unremarkable. Stomach/Bowel: High-density material throughout the colon making it difficult to assess for contrast extravasation. No obvious contrast extravasation. Moderate stool burden throughout the colon. No abnormal wall thickening to suggest ischemia. No evidence of bowel obstruction. Changes of gastric bypass. Lymphatic: No adenopathy Reproductive: Prior hysterectomy.  No adnexal masses. Other: No free fluid or free air. Musculoskeletal: Postoperative changes in the lumbar spine. Fluid collection in the midline of the back in the subcutaneous soft tissues measuring 5.1 x 3.7 cm compatible with postoperative fluid collection. No acute bony abnormality. IMPRESSION: VASCULAR Scattered aortoiliac atherosclerosis. No significant stenosis, aneurysm or dissection. Difficult to assess for GI bleed within the colon due to ingested high-density material throughout the colon. No obvious or large GI bleed visualized. NON-VASCULAR No changes of mesenteric ischemia noted. Mild gaseous distention of the colon moderate stool burden. Electronically Signed   By: Charlett Nose M.D.   On: 01/30/2023 22:19   DG Chest Port 1 View  Result Date: 01/30/2023 CLINICAL DATA:  Altered mental status EXAM: PORTABLE CHEST 1 VIEW COMPARISON:  09/09/2021 FINDINGS: Low volume AP portable examination. Cardiomegaly. Both lungs are clear. The visualized skeletal structures are unremarkable. IMPRESSION: Low volume AP portable examination. Cardiomegaly without acute abnormality of the lungs. Electronically Signed   By: Jearld Lesch M.D.   On: 01/30/2023 20:06     Labs:   Basic Metabolic Panel: Recent Labs   Lab 02/02/23 1141 02/03/23 0744 02/04/23 0807 02/05/23 0444 02/06/23 0606 02/07/23 0634 02/08/23 1112  NA  --  135 136 136 135 134* 136  K  --  3.1* 3.1* 3.9 2.9* 3.5 3.6  CL  --  104 103 104 104 104 107  CO2  --  21* 21* 20* 22 20* 21*  GLUCOSE  --  100* 97 96 83 112* 83  BUN  --  25* 20 17 9  7* <5*  CREATININE  --  1.13* 1.14* 1.01* 1.21* 1.56* 1.21*  CALCIUM  --  7.4* 7.5* 7.7* 7.8* 8.1* 8.2*  MG 2.2 2.2 2.0 2.2 2.0  --  1.9  PHOS 1.4* 2.2* 2.0* 1.7*  --   --   --    GFR Estimated Creatinine Clearance: 55.7 mL/min (A) (by C-G formula based on SCr of 1.21 mg/dL (H)). Liver Function Tests: Recent Labs  Lab 02/02/23 1141 02/07/23 0634  AST 35 24  ALT 32 27  ALKPHOS 58 58  BILITOT 0.5 0.6  PROT 5.8* 5.7*  ALBUMIN 2.3* 2.6*   No results for input(s): "LIPASE", "AMYLASE" in the last 168 hours. Recent Labs  Lab 02/07/23 0634  AMMONIA 32   Coagulation profile No results for input(s): "INR", "PROTIME" in the last 168 hours.  CBC: Recent Labs  Lab 02/02/23 0557 02/02/23 1141 02/03/23 0744  02/05/23 0444 02/05/23 1750 02/06/23 0606 02/07/23 0634 02/08/23 1112  WBC 13.0* 13.6*  --   --   --  8.2 9.3 9.1  HGB 7.2* 8.1*   < > 9.0* 9.1* 8.6*  TEST WILL BE CREDITED 9.5* 8.6*  HCT 21.9* 24.1*   < > 27.3* 27.7* 25.8*  TEST WILL BE CREDITED 30.4* 26.1*  MCV 88.7 89.9  --   --   --  92.1 96.5 94.9  PLT 191 218  --   --   --  315 409* 496*   < > = values in this interval not displayed.   Cardiac Enzymes: Recent Labs  Lab 02/02/23 1141  CKTOTAL 246*   BNP: Invalid input(s): "POCBNP" CBG: Recent Labs  Lab 02/07/23 2002 02/08/23 0121 02/08/23 0456 02/08/23 0819 02/08/23 1128  GLUCAP 84 102* 84 168* 92   D-Dimer No results for input(s): "DDIMER" in the last 72 hours. Hgb A1c No results for input(s): "HGBA1C" in the last 72 hours. Lipid Profile No results for input(s): "CHOL", "HDL", "LDLCALC", "TRIG", "CHOLHDL", "LDLDIRECT" in the last 72  hours. Thyroid function studies No results for input(s): "TSH", "T4TOTAL", "T3FREE", "THYROIDAB" in the last 72 hours.  Invalid input(s): "FREET3" Anemia work up No results for input(s): "VITAMINB12", "FOLATE", "FERRITIN", "TIBC", "IRON", "RETICCTPCT" in the last 72 hours. Microbiology Recent Results (from the past 240 hour(s))  Blood culture (routine x 2)     Status: None   Collection Time: 01/30/23 10:39 PM   Specimen: BLOOD RIGHT ARM  Result Value Ref Range Status   Specimen Description BLOOD RIGHT ARM  Final   Special Requests   Final    BOTTLES DRAWN AEROBIC AND ANAEROBIC Blood Culture results may not be optimal due to an excessive volume of blood received in culture bottles   Culture   Final    NO GROWTH 5 DAYS Performed at [facility], [address], [place], [place] [address]    Report Status 02/05/2023 FINAL  Final  Urine Culture (for pregnant, neutropenic or urologic patients or patients with an indwelling urinary catheter)     Status: Abnormal   Collection Time: 01/30/23 11:57 PM   Specimen: Urine, Clean Catch  Result Value Ref Range Status   Specimen Description URINE, CLEAN CATCH  Final   Special Requests   Final    NONE Performed at [facility], [address] [place], [place] [address]    Culture 70,000 COLONIES/mL ESCHERICHIA COLI (A)  Final   Report Status 02/01/2023 FINAL  Final   Organism ID, Bacteria ESCHERICHIA COLI (A)  Final      Susceptibility   Escherichia coli - MIC*    AMPICILLIN >=32 RESISTANT Resistant     CEFAZOLIN <=4 SENSITIVE Sensitive     CEFEPIME <=0.12 SENSITIVE Sensitive     CEFTRIAXONE <=0.25 SENSITIVE Sensitive     CIPROFLOXACIN 0.5 INTERMEDIATE Intermediate     GENTAMICIN >=16 RESISTANT Resistant     IMIPENEM <=0.25 SENSITIVE Sensitive     NITROFURANTOIN <=16 SENSITIVE Sensitive     TRIMETH/SULFA >=320 RESISTANT Resistant     AMPICILLIN/SULBACTAM 8 SENSITIVE Sensitive     PIP/TAZO <=4 SENSITIVE Sensitive     *  70,000 COLONIES/mL ESCHERICHIA COLI  Blood culture (routine x 2)     Status: None   Collection Time: 01/31/23  1:34 AM   Specimen: BLOOD LEFT HAND  Result Value Ref Range Status   Specimen Description BLOOD LEFT HAND  Final   Special Requests  Final    BOTTLES DRAWN AEROBIC AND ANAEROBIC Blood Culture results may not be optimal due to an inadequate volume of blood received in culture bottles   Culture   Final    NO GROWTH 5 DAYS Performed at [facility], [address] [place], [place] [address]    Report Status 02/05/2023 FINAL  Final  MRSA Next Gen by PCR, Nasal     Status: None   Collection Time: 01/31/23  6:43 AM   Specimen: Nasal Mucosa; Nasal Swab  Result Value Ref Range Status   MRSA by PCR Next Gen NOT DETECTED NOT DETECTED Final    Comment: (NOTE) The GeneXpert MRSA Assay (FDA approved for NASAL specimens only), is one component of a comprehensive MRSA colonization surveillance program. It is not intended to diagnose MRSA infection nor to guide or monitor treatment for MRSA infections. Test performance is not FDA approved in patients less than 23 years old. Performed at [facility], [address], [place], [place] [address]      Discharge Instructions:   Discharge Instructions     Ambulatory referral to Gastroenterology   Complete by: As directed    Melena, blood loss anemia, negative endoscopy while hospitalized   What is the reason for referral?: Other   Diet - low sodium heart healthy   Complete by: As directed    Discharge instructions   Complete by: As directed    Follow up with GI as scheduled by the clinic. Seek medical attention for worsening symptoms including ongoing dark stools, blood per rectum. Follow up with PCP in one week. Check blood work at that time.  Do not take over the counter pain medication including Advil Motrin.  Continue physical therapy at home.  Follow-up with Dr. Lovell Sheehan neurosurgery as outpatient in 1 month.    Increase activity slowly   Complete by: As directed       Allergies as of 02/08/2023       Reactions   Aspirin Other (See Comments), Shortness Of Breath, Anaphylaxis   Pt states she has had Toradol several times without any reactions Other Reaction(s): Other (See Comments), respiratory distress   Bee Venom Anaphylaxis   Sulfa Antibiotics Anaphylaxis   Evolocumab    Other Reaction(s): Unknown   Nsaids Other (See Comments)   History of gastric bypass   Other Other (See Comments)   No blood products.  Patient did  Request that only albumin or albumin-containing products may be administered        Medication List     STOP taking these medications    metoprolol succinate 25 MG 24 hr tablet Commonly known as: Toprol XL       TAKE these medications    Accu-Chek Guide test strip Generic drug: glucose blood Use as instructed   Accu-Chek Guide w/Device Kit 1 kit by Does not apply route 3 (three) times daily. To check blood sugars   accu-chek multiclix lancets Use as instructed   albuterol 108 (90 Base) MCG/ACT inhaler Commonly known as: VENTOLIN HFA INHALE 2 PUFFS INTO THE LUNGS EVERY 6 HOURS AS NEEDED FOR WHEEZING OR SHORTNESS OF BREATH   ALPRAZolam 1 MG tablet Commonly known as: XANAX Take 1 mg by mouth 3 (three) times daily as needed for anxiety.   amphetamine-dextroamphetamine 20 MG tablet Commonly known as: ADDERALL Take 20 mg by mouth every evening.   amphetamine-dextroamphetamine 30 MG 24 hr capsule Commonly known as: ADDERALL XR Take 30 mg by mouth  daily.   Anoro Ellipta 62.5-25 MCG/INH Aepb Generic drug: umeclidinium-vilanterol Inhale 1 puff into the lungs daily.   cetirizine 10 MG tablet Commonly known as: ZYRTEC Take 1 tablet (10 mg total) by mouth daily.   cyclobenzaprine 10 MG tablet Commonly known as: FLEXERIL Take 1 tablet (10 mg total) by mouth 3 (three) times daily as needed for muscle spasms.   dapagliflozin propanediol 5 MG Tabs  tablet Commonly known as: Farxiga Take 1 tablet (5 mg total) by mouth daily before breakfast.   diclofenac Sodium 1 % Gel Commonly known as: VOLTAREN Apply 2-4 g topically 2 (two) times daily as needed (wrist/knee pain).   docusate sodium 100 MG capsule Commonly known as: COLACE Take 1 capsule (100 mg total) by mouth 2 (two) times daily.   doxepin 75 MG capsule Commonly known as: SINEQUAN Take 75 mg by mouth at bedtime.   fluticasone 50 MCG/ACT nasal spray Commonly known as: FLONASE SHAKE LIQUID AND USE TWO SPRAYS IN EACH NOSTRIL DAILY   hydrocortisone-pramoxine 2.5-1 % rectal cream Commonly known as: Analpram HC Place 1 application rectally 3 (three) times daily.   isosorbide mononitrate 30 MG 24 hr tablet Commonly known as: IMDUR Take 1 tablet (30 mg total) by mouth daily.   levothyroxine 150 MCG tablet Commonly known as: SYNTHROID TAKE 1 TABLET(150 MCG) BY MOUTH DAILY BEFORE BREAKFAST   linaclotide 290 MCG Caps capsule Commonly known as: LINZESS Take 290 mcg by mouth See admin instructions. Every other night   morphine 15 MG 12 hr tablet Commonly known as: MS Contin Take 1 tablet (15 mg total) by mouth every 12 (twelve) hours.   nabumetone 750 MG tablet Commonly known as: RELAFEN Take 750 mg by mouth daily.   naloxone 4 MG/0.1ML Liqd nasal spray kit Commonly known as: NARCAN Place 1 spray into the nose as needed (overdose).   nicotine 7 mg/24hr patch Commonly known as: NICODERM CQ - dosed in mg/24 hr Place 1 patch (7 mg total) onto the skin daily. Start taking on: February 09, 2023   nitroGLYCERIN 0.4 MG SL tablet Commonly known as: NITROSTAT DISSOLVE 1 TABLET UNDER THE TONGUE EVERY 5 MINUTES AS NEEDED   Olmesartan-amLODIPine-HCTZ 40-5-25 MG Tabs Take 1 tablet by mouth daily.   omeprazole 40 MG capsule Commonly known as: PRILOSEC TAKE 1 CAPSULE(40 MG) BY MOUTH DAILY   ondansetron 4 MG tablet Commonly known as: ZOFRAN Take 1 tablet (4 mg total) by  mouth every 6 (six) hours as needed for nausea.   oxyCODONE-acetaminophen 5-325 MG tablet Commonly known as: PERCOCET/ROXICET Take 1-2 tablets by mouth every 4 (four) hours as needed for moderate pain.   polycarbophil 625 MG tablet Commonly known as: FIBERCON Take 1 tablet (625 mg total) by mouth 2 (two) times daily.   Praluent 150 MG/ML Soaj Generic drug: Alirocumab INJECT 1 PEN INTO SKIN EVERY 14 DAYS.   promethazine 25 MG tablet Commonly known as: PHENERGAN Take 1 tablet (25 mg total) by mouth every 8 (eight) hours as needed for nausea or vomiting.   Rexulti 2 MG Tabs tablet Generic drug: brexpiprazole Take 2 mg by mouth daily.   rOPINIRole 2 MG tablet Commonly known as: REQUIP Take 1 tablet (2 mg total) by mouth at bedtime.   solifenacin 5 MG tablet Commonly known as: VESIcare Take 1 tablet (5 mg total) by mouth daily.   spironolactone 25 MG tablet Commonly known as: ALDACTONE Take 0.5 tablets (12.5 mg total) by mouth daily.   VITAMIN B 12 PO Take  1 tablet by mouth daily. Unsure of dose   Vitamin D (Ergocalciferol) 1.25 MG (50000 UNIT) Caps capsule Commonly known as: DRISDOL Take 1 capsule (50,000 Units total) by mouth every 7 (seven) days. Start taking on: February 09, 2023   vortioxetine HBr 20 MG Tabs tablet Commonly known as: TRINTELLIX Take 20 mg by mouth daily.        Follow-up Information     [facility] Follow up.   Specialty: Home Health Services Why: for home health services Contact information: [address] [place] [address] [phone]                  Time coordinating discharge: 39 minutes  Signed:  Belton Peplinski  [facility] 02/08/2023, 2:43 PM

## 2023-02-08 NOTE — Progress Notes (Signed)
I called to give report to the 4E progressive care nurse  she is unavailable to take report. Charge nurse is unavailable to take report at this time as well.Returning call [phone] nurse still unavailable.

## 2023-02-08 NOTE — Progress Notes (Signed)
Patient ID: Caroline Williams, female   DOB: Apr 18, 1961, 62 y.o.   MRN: [id number] [facility] Surgery Progress Note  3 Days Post-Op  Subjective: CC-  Continues to feel better each day. Tolerating solid food without n/v or recurrence of abdominal pain. She had about 4 loose stools in the last 24 hours. Bowel movements are now brown.  Objective: Vital signs in last 24 hours: Temp:  [97.8 F (36.6 C)-98.5 F (36.9 C)] 98.3 F (36.8 C) (08/27 0824) Pulse Rate:  [76-101] 101 (08/27 0824) Resp:  [16-19] 17 (08/27 0824) BP: (145-160)/(67-108) 155/92 (08/27 0824) SpO2:  [97 %-100 %] 99 % (08/27 0835) Last BM Date : 02/08/23  Intake/Output from previous day: 08/26 0701 - 08/27 0700 In: 1268.1 [P.O.:220; I.V.:600; IV Piggyback:448.1] Out: -  Intake/Output this shift: Total I/O In: 592 [P.O.:592] Out: -    PE: Gen:  Alert, NAD, pleasant Abd: soft, ND, NT  Lab Results:  Recent Labs    02/06/23 0606 02/07/23 0634  WBC 8.2 9.3  HGB 8.6*  TEST WILL BE CREDITED 9.5*  HCT 25.8*  TEST WILL BE CREDITED 30.4*  PLT 315 409*   BMET Recent Labs    02/06/23 0606 02/07/23 0634  NA 135 134*  K 2.9* 3.5  CL 104 104  CO2 22 20*  GLUCOSE 83 112*  BUN 9 7*  CREATININE 1.21* 1.56*  CALCIUM 7.8* 8.1*   PT/INR No results for input(s): "LABPROT", "INR" in the last 72 hours. CMP     Component Value Date/Time   NA 134 (L) 02/07/2023 0634   NA 138 10/07/2022 1443   K 3.5 02/07/2023 0634   CL 104 02/07/2023 0634   CO2 20 (L) 02/07/2023 0634   GLUCOSE 112 (H) 02/07/2023 0634   BUN 7 (L) 02/07/2023 0634   BUN 19 10/07/2022 1443   CREATININE 1.56 (H) 02/07/2023 0634   CALCIUM 8.1 (L) 02/07/2023 0634   PROT 5.7 (L) 02/07/2023 0634   PROT 7.4 10/07/2022 1443   ALBUMIN 2.6 (L) 02/07/2023 0634   ALBUMIN 4.5 10/07/2022 1443   AST 24 02/07/2023 0634   ALT 27 02/07/2023 0634   ALKPHOS 58 02/07/2023 0634   BILITOT 0.6 02/07/2023 0634   BILITOT 0.2 10/07/2022 1443   GFRNONAA 37  (L) 02/07/2023 0634   GFRAA 60 07/14/2020 0754   Lipase     Component Value Date/Time   LIPASE 73 (H) 01/30/2023 1933       Studies/Results: No results found.  Anti-infectives: Anti-infectives (From admission, onward)    Start     Dose/Rate Route Frequency Ordered Stop   02/05/23 1245  piperacillin-tazobactam (ZOSYN) IVPB 3.375 g        3.375 g 12.5 mL/hr over 240 Minutes Intravenous Every 8 hours 02/05/23 1158 02/10/23 1359   02/02/23 0400  metroNIDAZOLE (FLAGYL) IVPB 500 mg  Status:  Discontinued        500 mg 100 mL/hr over 60 Minutes Intravenous Every 12 hours 02/02/23 0231 02/04/23 1747   01/31/23 0000  cefTRIAXone (ROCEPHIN) 1 g in sodium chloride 0.9 % 100 mL IVPB  Status:  Discontinued        1 g 200 mL/hr over 30 Minutes Intravenous  Once 01/30/23 2352 01/30/23 2354   01/31/23 0000  cefTRIAXone (ROCEPHIN) 1 g in sodium chloride 0.9 % 100 mL IVPB        1 g 200 mL/hr over 30 Minutes Intravenous Every 24 hours 01/30/23 2354 02/02/23 0253  Assessment/Plan GI bleed, melena Ischemic colitis  - s/p colonoscopy 8/24, biopsies: focal active colitis and granulation tissue type stroma, negative for chronicity, granuloma, dysplasia or malignancy.  - No labs today, but WBC normalized on most recent check and h/h stable. Stools are now nonbloody. Tolerating diet and abdominal exam benign. No indication for surgical intervention. Stable for discharge from surgical standpoint. She does not need any more antibiotics. We will sign off, please call with questions or concerns.   ID - zosyn 8/24>> FEN - soft diet VTE - SCDs   Hx gastric bypass AKI on CKD Chronic back pain requiring narcotics DM CAD Hypothyroidism Hx mood disorder, Bipolar 1, ADHD  I reviewed hospitalist notes, last 24 h vitals and pain scores, and last 48 h intake and output.    LOS: 8 days    Franne Forts, [facility] Surgery 02/08/2023, 10:14 AM Please see Amion for pager number  during day hours 7:00am-4:30pm

## 2023-02-08 NOTE — Progress Notes (Signed)
Occupational Therapy Treatment Patient Details Name: Caroline Williams MRN: [id number] DOB: September 09, 1960 Today's Date: 02/08/2023   History of present illness 62 y/o female admitted 8/18 with melena, abdominal pain, nonbloody emesis, acidosis, anemia, AKI. 8/24 colonoscopy with ischemic colitis. PMHx: L4-5 PLIF 7/31, anxiety, arthritis, bipolar 1, CAD, COPD, depression, DM, HTN, hernia repair, R TKA.   OT comments  Pt is making fair progress towards their acute OT goals. Upon arrival, pt was sitting EOB without brace donned, she needed maximal cues to recall back precautions and brace wear schedule. Pt is unable to obtain full figure four position for LB ADLs, therefore needs min A for LB ADLs - pt reports her husband will be able to assist at discharge. Pt also needing up to min A for simple transfers and declines further mobility despite education and encouragement. OT to continue to follow acutely to facilitate progress towards established goals. Pt has active d/c plans to go home today, she will benefit from [facility].        If plan is discharge home, recommend the following:  A lot of help with walking and/or transfers;A lot of help with bathing/dressing/bathroom;Assistance with cooking/housework;Direct supervision/assist for medications management;Direct supervision/assist for financial management;Assist for transportation;Help with stairs or ramp for entrance   Equipment Recommendations  None recommended by OT       Precautions / Restrictions Precautions Precautions: Fall;Back Required Braces or Orthoses: Spinal Brace Spinal Brace: Lumbar corset Restrictions Weight Bearing Restrictions: No       Mobility Bed Mobility Overal bed mobility: Modified Independent                  Transfers Overall transfer level: Needs assistance Equipment used: Rolling walker (2 wheels) Transfers: Sit to/from Stand Sit to Stand: Min assist           General transfer comment: declined  further mobility     Balance Overall balance assessment: Needs assistance Sitting-balance support: No upper extremity supported, Feet supported Sitting balance-Leahy Scale: Fair     Standing balance support: Bilateral upper extremity supported Standing balance-Leahy Scale: Poor                             ADL either performed or assessed with clinical judgement   ADL Overall ADL's : Needs assistance/impaired                     Lower Body Dressing: Minimal assistance Lower Body Dressing Details (indicate cue type and reason): assist to thread feet, pt unable to obtain full figure 4 position Toilet Transfer: Minimal assistance;Rolling walker (2 wheels) Toilet Transfer Details (indicate cue type and reason): declined further mobility beyond standing EOB         Functional mobility during ADLs: Minimal assistance;Cueing for safety;Cueing for sequencing General ADL Comments: maximal cues needed to adhere to back precautions. min A for safe transfers and LB ADLs    Extremity/Trunk Assessment Upper Extremity Assessment Upper Extremity Assessment: Generalized weakness   Lower Extremity Assessment Lower Extremity Assessment: Generalized weakness        Vision   Vision Assessment?: No apparent visual deficits   Perception Perception Perception: Not tested   Praxis Praxis Praxis: Not tested    Cognition Arousal: Alert Behavior During Therapy: Flat affect Overall Cognitive Status: Impaired/Different from baseline Area of Impairment: Problem solving, Safety/judgement, Memory  Memory: Decreased recall of precautions   Safety/Judgement: Decreased awareness of deficits, Decreased awareness of safety   Problem Solving: Slow processing General Comments: able to recall "BLT" but unable to state what it stands for. Self limiting.              General Comments VSS on RA    Pertinent Vitals/ Pain       Pain Assessment Pain  Assessment: No/denies pain   Frequency  Min 1X/week        Progress Toward Goals  OT Goals(current goals can now be found in the care plan section)  Progress towards OT goals: Progressing toward goals  Acute Rehab OT Goals Patient Stated Goal: to go home OT Goal Formulation: With patient Time For Goal Achievement: 02/15/23 Potential to Achieve Goals: Good ADL Goals Pt Will Perform Grooming: with contact guard assist;standing Pt Will Perform Upper Body Dressing: with set-up;sitting Pt Will Perform Lower Body Dressing: with min assist;sit to/from stand Pt Will Transfer to Toilet: with contact guard assist;ambulating;regular height toilet Pt Will Perform Toileting - Clothing Manipulation and hygiene: with modified independence;sit to/from stand Pt Will Perform Tub/Shower Transfer: Tub transfer;with modified independence;tub bench;rolling walker Additional ADL Goal #1: pt will indep recall 3/3 back precuations  Plan Discharge plan remains appropriate;Frequency remains appropriate       AM-PAC OT "6 Clicks" Daily Activity     Outcome Measure   Help from another person eating meals?: A Lot Help from another person taking care of personal grooming?: A Lot Help from another person toileting, which includes using toliet, bedpan, or urinal?: A Little Help from another person bathing (including washing, rinsing, drying)?: A Little Help from another person to put on and taking off regular upper body clothing?: A Little Help from another person to put on and taking off regular lower body clothing?: A Little 6 Click Score: 16    End of Session Equipment Utilized During Treatment: Rolling walker (2 wheels)  OT Visit Diagnosis: Other abnormalities of gait and mobility (R26.89);Pain;Muscle weakness (generalized) (M62.81)   Activity Tolerance Patient tolerated treatment well   Patient Left in bed;with call bell/phone within reach;with bed alarm set   Nurse Communication Mobility  status        Time: 2202-5427 OT Time Calculation (min): 17 min  Charges: OT General Charges $OT Visit: 1 Visit OT Treatments $Self Care/Home Management : 8-22 mins  Derenda Mis, OTR/L Acute Rehabilitation Services Office [phone] Secure Chat Communication Preferred   Donia Pounds 02/08/2023, 3:21 PM

## 2023-02-08 NOTE — Progress Notes (Signed)
Physical Therapy Treatment Patient Details Name: Caroline Williams MRN: [id number] DOB: 31-May-1961 Today's Date: 02/08/2023   History of Present Illness 62 y/o female admitted 8/18 with melena, abdominal pain, nonbloody emesis, acidosis, anemia, AKI. 8/24 colonoscopy with ischemic colitis. PMHx: L4-5 PLIF 7/31, anxiety, arthritis, bipolar 1, CAD, COPD, depression, DM, HTN, hernia repair, R TKA.    PT Comments  Pt pleasant and willing to get OOB. Brace present with min assist to don with cues for sequence. Pt able to recall all precautions but requires encouragement to walk to bathroom stating she has only been getting to [facility] despite prior education to increase mobility. Pt denied further gait trials or walking into hall but states she will agree next session. Pt educated for need to progress mobility and function and to return to activity she would do at home like walking into bathroom. Will continue to follow.      If plan is discharge home, recommend the following: Assistance with cooking/housework;Assist for transportation;Help with stairs or ramp for entrance;A lot of help with walking and/or transfers;A lot of help with bathing/dressing/bathroom;Supervision due to cognitive status   Can travel by private vehicle        Equipment Recommendations  None recommended by PT    Recommendations for Other Services       Precautions / Restrictions Precautions Precautions: Fall;Back Precaution Booklet Issued: No Precaution Comments: pt stating 3/3 precautions Required Braces or Orthoses: Spinal Brace Spinal Brace: Lumbar corset Restrictions Weight Bearing Restrictions: No     Mobility  Bed Mobility Overal bed mobility: Modified Independent Bed Mobility: Rolling, Sidelying to Sit Rolling: Modified independent (Device/Increase time) Sidelying to sit: Supervision       General bed mobility comments: cues for precautions with movement to prevent twisting, HOB 20 degrees, increased  time    Transfers Overall transfer level: Needs assistance   Transfers: Sit to/from Stand Sit to Stand: Min assist           General transfer comment: min assist with cues for hand placement to rise from bed and BSC over toilet    Ambulation/Gait Ambulation/Gait assistance: Contact guard assist Gait Distance (Feet): 15 Feet Assistive device: Rolling walker (2 wheels) Gait Pattern/deviations: Step-through pattern, Decreased stride length, Trunk flexed   Gait velocity interpretation: <1.8 ft/sec, indicate of risk for recurrent falls   General Gait Details: cues for posture, proximity to RW and increased distance. Pt walked 15' x 2 with seated rest at toilet. Pt refused further gait due to fatigue and weakness despite education   Stairs             Wheelchair Mobility     Tilt Bed    Modified Rankin (Stroke Patients Only)       Balance Overall balance assessment: Needs assistance Sitting-balance support: No upper extremity supported, Feet supported Sitting balance-Leahy Scale: Fair     Standing balance support: Bilateral upper extremity supported Standing balance-Leahy Scale: Poor Standing balance comment: Rw in standing                            Cognition Arousal: Alert Behavior During Therapy: Flat affect Overall Cognitive Status: Impaired/Different from baseline Area of Impairment: Problem solving                   Current Attention Level: Selective   Following Commands: Follows one step commands consistently Safety/Judgement: Decreased awareness of deficits     General Comments: pt remains  self limiting with refusal to attempt walking in hall or to bathroom during the day, despite stating weakness and need to get stronger        Exercises      General Comments        Pertinent Vitals/Pain Pain Assessment Pain Assessment: No/denies pain    Home Living                          Prior Function             PT Goals (current goals can now be found in the care plan section) Progress towards PT goals: Progressing toward goals    Frequency    Min 1X/week      PT Plan      Co-evaluation              AM-PAC PT "6 Clicks" Mobility   Outcome Measure  Help needed turning from your back to your side while in a flat bed without using bedrails?: A Little Help needed moving from lying on your back to sitting on the side of a flat bed without using bedrails?: A Little Help needed moving to and from a bed to a chair (including a wheelchair)?: A Little Help needed standing up from a chair using your arms (e.g., wheelchair or bedside chair)?: A Little Help needed to walk in hospital room?: A Little Help needed climbing 3-5 steps with a railing? : Total 6 Click Score: 16    End of Session Equipment Utilized During Treatment: Back brace Activity Tolerance: Patient limited by fatigue Patient left: in chair;with call bell/phone within reach;with chair alarm set Nurse Communication: Mobility status PT Visit Diagnosis: Unsteadiness on feet (R26.81);Other abnormalities of gait and mobility (R26.89)     Time: 1914-7829 PT Time Calculation (min) (ACUTE ONLY): 29 min  Charges:    $Gait Training: 8-22 mins $Therapeutic Activity: 8-22 mins PT General Charges $$ ACUTE PT VISIT: 1 Visit                     Merryl Hacker, PT Acute Rehabilitation Services Office: [phone]    Enedina Finner Moniqua Engebretsen 02/08/2023, 11:19 AM

## 2023-02-08 NOTE — TOC Progression Note (Addendum)
Transition of Care [facility]) - Progression Note    Patient Details  Name: Caroline Williams MRN: [id number] Date of Birth: January 07, 1961  Transition of Care [facility]) CM/SW Contact  Janae Bridgeman, RN Phone Number: 02/08/2023, 2:19 PM  Clinical Narrative:    CM met with the patient at the bedside to discuss patient discharging home today.  The patient states that her husband is running errands and that she will contact him regarding her return home today.  Patient was updated that [facility] will be providing her home health services and that they will reach out to her in the next 24-48 hours to set up services.  Bedside nursing will meet with the patient to discuss friend to assist with transportation to home versus home with [employer] since the patient's husband does not drive per the patient.  02/08/23 1518 - Patient states that she was unable to obtain family to provide transportation to home and can not pay for cab home.  TOC Supervisor, Shanda Bumps Scinto notified and bedside nursing was provided with taxi voucher to arrange cab ride to home.  Address is confirmed with the patient and patient states that she has keys to her home and husband should be at the house to assist from the taxi to the front door.   Expected Discharge Plan: Home w Home Health Services Barriers to Discharge: Continued Medical Work up  Expected Discharge Plan and Services   Discharge Planning Services: CM Consult Post Acute Care Choice: Home Health Living arrangements for the past 2 months: Single Family Home Expected Discharge Date: 02/08/23               DME Arranged: N/A         HH Arranged: PT, OT HH Agency: [facility] Date [facility] Agency Contacted: 02/06/23 Time HH Agency Contacted: 1136 Representative spoke with at [facility] Agency: Kandee Keen   Social Determinants of Health (SDOH) Interventions SDOH Screenings   Food Insecurity: No Food Insecurity (02/04/2023)  Housing: Low Risk  (02/04/2023)   Transportation Needs: Unmet Transportation Needs (02/04/2023)  Utilities: Not At Risk (02/04/2023)  Alcohol Screen: Low Risk  (11/03/2022)  Depression (PHQ2-9): Low Risk  (11/03/2022)  Financial Resource Strain: Medium Risk (11/03/2022)  Physical Activity: Inactive (11/03/2022)  Social Connections: Moderately Integrated (11/03/2022)  Stress: Stress Concern Present (11/03/2022)  Tobacco Use: Medium Risk (02/05/2023)    Readmission Risk Interventions    02/02/2023   11:22 AM  Readmission Risk Prevention Plan  Transportation Screening Complete  PCP or Specialist Appt within 5-7 Days Complete  Home Care Screening Complete

## 2023-02-08 NOTE — Progress Notes (Signed)
Nurse refused to take report

## 2023-02-08 NOTE — Progress Notes (Signed)
Subjective: The patient is alert and pleasant.  She is in no apparent distress.  Objective: Vital signs in last 24 hours: Temp:  [97.8 F (36.6 C)-98.5 F (36.9 C)] 97.8 F (36.6 C) (08/27 0457) Pulse Rate:  [66-92] 92 (08/27 0457) Resp:  [16-19] 16 (08/27 0457) BP: (145-160)/(67-108) 160/94 (08/27 0457) SpO2:  [97 %-100 %] 100 % (08/27 0457) Estimated body mass index is 35.78 kg/m as calculated from the following:   Height as of this encounter: 5\' 5"  (1.651 m).   Weight as of this encounter: 97.5 kg.   Intake/Output from previous day: 08/26 0701 - 08/27 0700 In: 1268.1 [P.O.:220; I.V.:600; IV Piggyback:448.1] Out: -  Intake/Output this shift: No intake/output data recorded.  Physical exam the patient is alert and pleasant.  She is moving her lower extremities well.  Lab Results: Recent Labs    02/06/23 0606 02/07/23 0634  WBC 8.2 9.3  HGB 8.6*  TEST WILL BE CREDITED 9.5*  HCT 25.8*  TEST WILL BE CREDITED 30.4*  PLT 315 409*   BMET Recent Labs    02/06/23 0606 02/07/23 0634  NA 135 134*  K 2.9* 3.5  CL 104 104  CO2 22 20*  GLUCOSE 83 112*  BUN 9 7*  CREATININE 1.21* 1.56*  CALCIUM 7.8* 8.1*    Studies/Results: No results found.  Assessment/Plan: Status post lumbar fusion: The patient seems to be progressing well.  I will see her in the office in a month or 2.  LOS: 8 days     [name] 02/08/2023, 7:38 AM     Patient ID: Caroline Williams, female   DOB: Jan 05, 1961, 62 y.o.   MRN: [id number]

## 2023-02-09 ENCOUNTER — Telehealth: Payer: Self-pay

## 2023-02-09 LAB — SURGICAL PATHOLOGY

## 2023-02-09 NOTE — Transitions of Care (Post Inpatient/ED Visit) (Signed)
   02/09/2023  Name: Caroline Williams MRN: [id number] DOB: [birth year]  Today's TOC FU Call Status: Today's TOC FU Call Status:: Unsuccessful Call (1st Attempt) Unsuccessful Call (1st Attempt) Date: 02/09/23  Attempted to reach the patient regarding the most recent Inpatient/ED visit.  Follow Up Plan: Additional outreach attempts will be made to reach the patient to complete the Transitions of Care (Post Inpatient/ED visit) call.      Antionette Fairy, RN,BSN,CCM [facility] Care Management Care Management Community Coordinator Direct Phone: [phone] Toll Free: [phone] Fax: [phone]

## 2023-02-10 ENCOUNTER — Telehealth: Payer: Self-pay

## 2023-02-10 ENCOUNTER — Telehealth: Payer: Medicare HMO | Admitting: Physician Assistant

## 2023-02-10 ENCOUNTER — Ambulatory Visit: Payer: Self-pay

## 2023-02-10 ENCOUNTER — Telehealth: Payer: Self-pay | Admitting: Family Medicine

## 2023-02-10 DIAGNOSIS — Z91199 Patient's noncompliance with other medical treatment and regimen due to unspecified reason: Secondary | ICD-10-CM

## 2023-02-10 NOTE — Transitions of Care (Post Inpatient/ED Visit) (Signed)
   02/10/2023  Name: SAN BRAATEN MRN: [id number] DOB: March 04, 1961  Today's TOC FU Call Status: Today's TOC FU Call Status:: Unsuccessful Call (3rd Attempt) Unsuccessful Call (3rd Attempt) Date: 02/10/23  Attempted to reach the patient regarding the most recent Inpatient/ED visit.  Follow Up Plan: No further outreach attempts will be made at this time. We have been unable to contact the patient.    Antionette Fairy, RN,BSN,CCM [facility] Care Management Care Management Community Coordinator Direct Phone: [phone] Toll Free: [phone] Fax: [phone]

## 2023-02-10 NOTE — Telephone Encounter (Signed)
Summary: Yeast infection   The patient called and just got out of [place] yesterday. She was in intensive care for a seizure and fed iv antibiotics. She unfortunately has come down with a terrible yeast infection and needs to speak with a nurse. Please assist patient further    Called pt - left message on machine to return call.

## 2023-02-10 NOTE — Progress Notes (Signed)
The patient no-showed for appointment despite this provider sending direct link with no response and waiting for at least 10 minutes from appointment time for patient to join. They will be marked as a NS for this appointment/time.   William Cody Martin, PA-C    

## 2023-02-10 NOTE — Telephone Encounter (Unsigned)
Copied from CRM [id number]. Topic: Quick Communication - Home Health Verbal Orders >> Feb 10, 2023  2:32 PM Macon Large wrote: Caller/Agency: Chelsea with [facility] Number: [phone] Requesting OT/PT/Skilled Nursing/Social Work/Speech Therapy: skilled nursing evaluation Frequency: nursing evaluation

## 2023-02-10 NOTE — Telephone Encounter (Signed)
Chief Complaint: Vaginal symptoms Symptoms: itching, burning, small red bumps like a rash Frequency: constant Pertinent Negatives: Patient denies urinary symptoms, fever, and pain Disposition: [] ED /[] Urgent Care (no appt availability in office) / [] Appointment(In office/virtual)/ [x]  [place] [facility]/ [] Home Care/ [] Refused Recommended Disposition /[] [place] [facility]/ []  Follow-up with PCP Additional Notes: Patient stated she was in hospital and received IV antibiotic therapy for 5 days. Patient stated she was discharged from the hospital yesterday and after discharge she began to have vaginal symptoms. Patient states she has vaginal itching, small red bumps like a diaper rash and some discharge. Patient states she thinks she needs a prescription to treat her symptoms. No appointments are available in the office. Patient has been scheduled with virtual urgent care today at 1930. Reason for Disposition  MODERATE-SEVERE itching (i.e., interferes with school, work, or sleep)  Answer Assessment - Initial Assessment Questions 1. SYMPTOM: "What's the main symptom you're concerned about?" (e.g., pain, itching, dryness)     Itching 2. LOCATION: "Where is the  itchy located?" (e.g., inside/outside, left/right)     All over my vagina 3. ONSET: "When did the  itching  start?"     Yesterday 4. PAIN: "Is there any pain?" If Yes, ask: "How bad is it?" (Scale: 1-10; mild, moderate, severe)   -  MILD (1-3): Doesn't interfere with normal activities.    -  MODERATE (4-7): Interferes with normal activities (e.g., work or school) or awakens from sleep.     -  SEVERE (8-10): Excruciating pain, unable to do any normal activities.     0/10 5. ITCHING: "Is there any itching?" If Yes, ask: "How bad is it?" (Scale: 1-10; mild, moderate, severe)     10/10 6. CAUSE: "What do you think is causing the discharge?" "Have you had the same problem before? What happened then?"     I think you yeast infection  because I was on IV antibiotics for 5 days  7. OTHER SYMPTOMS: "Do you have any other symptoms?" (e.g., fever, itching, vaginal bleeding, pain with urination, injury to genital area, vaginal foreign body)     Burning in the vaginal area.  Protocols used: Vaginal Symptoms-A-AH

## 2023-02-10 NOTE — Transitions of Care (Post Inpatient/ED Visit) (Signed)
   02/10/2023  Name: Caroline Williams MRN: [id number] DOB: 18-Jun-1960  Today's TOC FU Call Status: Today's TOC FU Call Status:: Unsuccessful Call (2nd Attempt) Unsuccessful Call (2nd Attempt) Date: 02/10/23  Attempted to reach the patient regarding the most recent Inpatient/ED visit.  Follow Up Plan: Additional outreach attempts will be made to reach the patient to complete the Transitions of Care (Post Inpatient/ED visit) call.     Antionette Fairy, RN,BSN,CCM [facility] Care Management Care Management Community Coordinator Direct Phone: [phone] Toll Free: [phone] Fax: [phone]

## 2023-02-10 NOTE — Telephone Encounter (Signed)
Verbal orders given for patient.

## 2023-02-10 NOTE — Telephone Encounter (Signed)
Noted  

## 2023-02-10 NOTE — Telephone Encounter (Signed)
Summary: Yeast infection   The patient called and just got out of [place] yesterday. She was in intensive care for a seizure and fed iv antibiotics. She unfortunately has come down with a terrible yeast infection and needs to speak with a nurse. Please assist patient further      Called pt  - left message on machine to return our call.

## 2023-02-11 ENCOUNTER — Ambulatory Visit: Payer: Self-pay | Admitting: *Deleted

## 2023-02-11 NOTE — Telephone Encounter (Signed)
  Chief Complaint: Vaginal yeast infection from 5 days of IV antibiotics in the hospital.   She is requesting that [name] send an rx for the Diflucan pill and cream to apply to the external vaginal area that is irritated to day if possible to the [facility] on [address]. Symptoms: Burning, itching, small red bumps in her vaginal area. Frequency: Since being on the antibiotics. Pertinent Negatives: Patient denies N/A Disposition: [] ED /[] Urgent Care (no appt availability in office) / [] Appointment(In office/virtual)/ []  [place] [facility]/ [] Home Care/ [] Refused Recommended Disposition /[] [place] [facility]/ [x]  Follow-up with PCP Additional Notes: High priority message sent to [name] with pt's request.    There are no appts available at [facility] so pt is requesting what she needs to be called in.    "I get these yeast infections every time I'm on an antibiotic".   "It's a long weekend coming up and can't [name] please call in what I need?"
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
## 2023-02-16 DIAGNOSIS — M5416 Radiculopathy, lumbar region: Secondary | ICD-10-CM | POA: Diagnosis not present

## 2023-02-16 DIAGNOSIS — Z6834 Body mass index (BMI) 34.0-34.9, adult: Secondary | ICD-10-CM | POA: Diagnosis not present

## 2023-02-16 DIAGNOSIS — F1190 Opioid use, unspecified, uncomplicated: Secondary | ICD-10-CM | POA: Diagnosis not present

## 2023-02-16 DIAGNOSIS — G894 Chronic pain syndrome: Secondary | ICD-10-CM | POA: Diagnosis not present

## 2023-02-16 DIAGNOSIS — Z9889 Other specified postprocedural states: Secondary | ICD-10-CM | POA: Diagnosis not present

## 2023-03-03 ENCOUNTER — Ambulatory Visit: Payer: Medicare HMO | Admitting: Family Medicine

## 2023-03-03 ENCOUNTER — Other Ambulatory Visit: Payer: Self-pay | Admitting: Family Medicine

## 2023-03-03 DIAGNOSIS — E1140 Type 2 diabetes mellitus with diabetic neuropathy, unspecified: Secondary | ICD-10-CM

## 2023-03-03 DIAGNOSIS — N3281 Overactive bladder: Secondary | ICD-10-CM

## 2023-03-07 ENCOUNTER — Ambulatory Visit: Payer: Medicare HMO

## 2023-03-13 ENCOUNTER — Other Ambulatory Visit: Payer: Self-pay | Admitting: Family Medicine

## 2023-03-13 DIAGNOSIS — E039 Hypothyroidism, unspecified: Secondary | ICD-10-CM

## 2023-03-14 NOTE — Telephone Encounter (Signed)
Requested medication (s) are due for refill today -yes  Requested medication (s) are on the active medication list -yes  Future visit scheduled -yes  Last refill: 05/10/22 #30  Notes to clinic: not sure compliant- medication has notes  Requested Prescriptions  Pending Prescriptions Disp Refills   levothyroxine (SYNTHROID) 150 MCG tablet [Pharmacy Med Name: LEVOTHYROXINE 0.150MG  ( ) TAB] 30 tablet 0    Sig: TAKE 1 TABLET(150 MCG) BY MOUTH DAILY BEFORE BREAKFAST     Endocrinology:  Hypothyroid Agents Failed - 03/13/2023 11:33 AM      Failed - TSH in normal range and within 360 days    TSH  Date Value Ref Range Status  01/31/2023 86.881 (H) 0.350 - 4.500 uIU/mL Final    Comment:    Performed by a 3rd Generation assay with a functional sensitivity of <=0.01 uIU/mL. Performed at [facility], [address] [place], [place] [address]   10/07/2022 91.900 (H) 0.450 - 4.500 uIU/mL Final         Passed - Valid encounter within last 12 months    Recent Outpatient Visits           5 months ago Type 2 diabetes mellitus with chronic painful diabetic neuropathy [facility])   [place] [facility] Hoy Register, MD   1 year ago Breast abscess   [place] [facility] [place], [place] W, NP   1 year ago Type 2 diabetes mellitus with chronic painful diabetic neuropathy [facility])   [place] [facility] Hoy Register, MD   1 year ago Melena   [place] [facility] [place], Odette Horns, MD   1 year ago Type 2 diabetes mellitus with chronic painful diabetic neuropathy [facility])   [place] [facility] Hoy Register, MD       Future Appointments             In 3 weeks [place], [place] [facility]   In 2 months Lynnette Caffey, Charlies Constable, MD [facility], [facility]               Requested Prescriptions   Pending Prescriptions Disp Refills   levothyroxine (SYNTHROID) 150 MCG tablet [Pharmacy Med Name: LEVOTHYROXINE 0.150MG  ( ) TAB] 30 tablet 0    Sig: TAKE 1 TABLET(150 MCG) BY MOUTH DAILY BEFORE BREAKFAST     Endocrinology:  Hypothyroid Agents Failed - 03/13/2023 11:33 AM      Failed - TSH in normal range and within 360 days    TSH  Date Value Ref Range Status  01/31/2023 86.881 (H) 0.350 - 4.500 uIU/mL Final    Comment:    Performed by a 3rd Generation assay with a functional sensitivity of <=0.01 uIU/mL. Performed at [facility], [address] [place], [place] [address]   10/07/2022 91.900 (H) 0.450 - 4.500 uIU/mL Final         Passed - Valid encounter within last 12 months    Recent Outpatient Visits           5 months ago Type 2 diabetes mellitus with chronic painful diabetic neuropathy [facility])   [place] [facility] Center Hoy Register, MD   1 year ago Breast abscess   [facility] [place], [place] W, NP   1 year ago Type 2 diabetes mellitus with chronic painful diabetic neuropathy (  [facility])   [place] [facility] Hoy Register, MD   1 year ago Melena   [place] [facility] [place], [place], MD   1 year ago Type 2 diabetes mellitus with chronic painful diabetic neuropathy [facility])   [place] [facility] Center Hoy Register, MD       Future Appointments             In 3 weeks [place], [place] [facility]   In 2 months Lynnette Caffey, Charlies Constable, MD [facility], [facility]

## 2023-03-15 ENCOUNTER — Ambulatory Visit: Payer: Medicare HMO | Attending: Physician Assistant

## 2023-03-15 DIAGNOSIS — E119 Type 2 diabetes mellitus without complications: Secondary | ICD-10-CM | POA: Diagnosis not present

## 2023-03-15 DIAGNOSIS — R32 Unspecified urinary incontinence: Secondary | ICD-10-CM | POA: Diagnosis not present

## 2023-03-16 ENCOUNTER — Other Ambulatory Visit: Payer: Self-pay | Admitting: Family Medicine

## 2023-03-16 DIAGNOSIS — N3281 Overactive bladder: Secondary | ICD-10-CM

## 2023-03-16 DIAGNOSIS — E1140 Type 2 diabetes mellitus with diabetic neuropathy, unspecified: Secondary | ICD-10-CM

## 2023-03-17 NOTE — Telephone Encounter (Signed)
Requested Prescriptions  Pending Prescriptions Disp Refills   FARXIGA 5 MG TABS tablet [Pharmacy Med Name: FARXIGA 5MG  TAB] 90 tablet 0    Sig: TAKE ONE TABLET BY MOUTH DAILY BEFORE BREAKFAST     Endocrinology:  Diabetes - SGLT2 Inhibitors Failed - 03/16/2023 12:50 PM      Failed - Cr in normal range and within 360 days    Creatinine, Ser  Date Value Ref Range Status  02/08/2023 1.21 (H) 0.44 - 1.00 mg/dL Final   Creatinine, Urine  Date Value Ref Range Status  01/30/2023 47 mg/dL Final    Comment:    Performed at [facility], [address], [place], [place] [address]         Failed - eGFR in normal range and within 360 days    GFR calc Af Amer  Date Value Ref Range Status  07/14/2020 60 >59 mL/min/1.73 Final    Comment:    **In accordance with recommendations from the NKF-ASN Task force,**   [facility] is in the process of updating its eGFR calculation to the   2021 CKD-EPI creatinine equation that estimates kidney function   without a race variable.    GFR, Estimated  Date Value Ref Range Status  02/08/2023 51 (L) >60 mL/min Final    Comment:    (NOTE) Calculated using the CKD-EPI Creatinine Equation (2021)    eGFR  Date Value Ref Range Status  10/07/2022 43 (L) >59 mL/min/1.73 Final         Passed - HBA1C is between 0 and 7.9 and within 180 days    HbA1c, POC (controlled diabetic range)  Date Value Ref Range Status  10/07/2022 6.5 0.0 - 7.0 % Final   Hgb A1c MFr Bld  Date Value Ref Range Status  01/11/2023 6.0 (H) 4.8 - 5.6 % Final    Comment:    (NOTE)         Prediabetes: 5.7 - 6.4         Diabetes: >6.4         Glycemic control for adults with diabetes: <7.0          Passed - Valid encounter within last 6 months    Recent Outpatient Visits           5 months ago Type 2 diabetes mellitus with chronic painful diabetic neuropathy (HCC)   [place] [facility] Hoy Register, MD   1 year ago Breast abscess   [place]  [facility] [place], [place] W, NP   1 year ago Type 2 diabetes mellitus with chronic painful diabetic neuropathy [facility])   [place] [facility] Hoy Register, MD   1 year ago Melena   [place] [facility] [place], Odette Horns, MD   1 year ago Type 2 diabetes mellitus with chronic painful diabetic neuropathy [facility])   [place] [facility] Hoy Register, MD       Future Appointments             In 2 weeks [place], [place] [facility]   In 2 months Lynnette Caffey, Charlies Constable, MD [facility] at [facility], [facility]             solifenacin (VESICARE) 5 MG tablet [Pharmacy Med Name: SOLIFENACIN SUCCINAT 5MG  TAB] 90 tablet 0    Sig: TAKE ONE TABLET BY MOUTH DAILY  Urology:  Bladder Agents 2 Failed - 03/16/2023 12:50 PM      Failed - Cr in normal range and within 360 days    Creatinine, Ser  Date Value Ref Range Status  02/08/2023 1.21 (H) 0.44 - 1.00 mg/dL Final   Creatinine, Urine  Date Value Ref Range Status  01/30/2023 47 mg/dL Final    Comment:    Performed at [facility], [address] [place], [place] [address]         Passed - ALT in normal range and within 360 days    ALT  Date Value Ref Range Status  02/07/2023 27 0 - 44 U/L Final         Passed - AST in normal range and within 360 days    AST  Date Value Ref Range Status  02/07/2023 24 15 - 41 U/L Final         Passed - Valid encounter within last 12 months    Recent Outpatient Visits           5 months ago Type 2 diabetes mellitus with chronic painful diabetic neuropathy (HCC)   [place] [facility] Hoy Register, MD   1 year ago Breast abscess   [place] [facility] [place], [place], NP   1 year ago Type 2 diabetes mellitus with chronic painful diabetic neuropathy [facility])   [place] [facility] & [facility] Center Hoy Register, MD   1 year ago Melena   [place] [facility] [place], Odette Horns, MD   1 year ago Type 2 diabetes mellitus with chronic painful diabetic neuropathy [facility])    [facility] Center Hoy Register, MD       Future Appointments             In 2 weeks [place], [place] [facility]   In 2 months Lynnette Caffey, Charlies Constable, MD [facility] at [facility], [facility]

## 2023-03-21 ENCOUNTER — Emergency Department (HOSPITAL_COMMUNITY): Payer: Medicare HMO

## 2023-03-21 ENCOUNTER — Inpatient Hospital Stay (HOSPITAL_COMMUNITY)
Admission: EM | Admit: 2023-03-21 | Discharge: 2023-03-29 | DRG: 064 | Disposition: A | Discharge status: Skilled Nursing Facility | Payer: Medicare HMO | Attending: Internal Medicine | Admitting: Internal Medicine

## 2023-03-21 DIAGNOSIS — I4719 Other supraventricular tachycardia: Secondary | ICD-10-CM | POA: Diagnosis present

## 2023-03-21 DIAGNOSIS — Z79890 Hormone replacement therapy (postmenopausal): Secondary | ICD-10-CM

## 2023-03-21 DIAGNOSIS — R29818 Other symptoms and signs involving the nervous system: Secondary | ICD-10-CM | POA: Diagnosis not present

## 2023-03-21 DIAGNOSIS — M545 Low back pain, unspecified: Secondary | ICD-10-CM | POA: Diagnosis present

## 2023-03-21 DIAGNOSIS — Z7189 Other specified counseling: Secondary | ICD-10-CM | POA: Diagnosis not present

## 2023-03-21 DIAGNOSIS — E039 Hypothyroidism, unspecified: Secondary | ICD-10-CM | POA: Diagnosis present

## 2023-03-21 DIAGNOSIS — G894 Chronic pain syndrome: Secondary | ICD-10-CM | POA: Diagnosis present

## 2023-03-21 DIAGNOSIS — Z882 Allergy status to sulfonamides status: Secondary | ICD-10-CM

## 2023-03-21 DIAGNOSIS — G4734 Idiopathic sleep related nonobstructive alveolar hypoventilation: Secondary | ICD-10-CM | POA: Diagnosis not present

## 2023-03-21 DIAGNOSIS — R471 Dysarthria and anarthria: Secondary | ICD-10-CM | POA: Diagnosis present

## 2023-03-21 DIAGNOSIS — E1122 Type 2 diabetes mellitus with diabetic chronic kidney disease: Secondary | ICD-10-CM | POA: Diagnosis present

## 2023-03-21 DIAGNOSIS — E669 Obesity, unspecified: Secondary | ICD-10-CM | POA: Diagnosis present

## 2023-03-21 DIAGNOSIS — I2609 Other pulmonary embolism with acute cor pulmonale: Secondary | ICD-10-CM

## 2023-03-21 DIAGNOSIS — I82441 Acute embolism and thrombosis of right tibial vein: Secondary | ICD-10-CM | POA: Diagnosis present

## 2023-03-21 DIAGNOSIS — J4489 Other specified chronic obstructive pulmonary disease: Secondary | ICD-10-CM | POA: Diagnosis present

## 2023-03-21 DIAGNOSIS — Z8673 Personal history of transient ischemic attack (TIA), and cerebral infarction without residual deficits: Secondary | ICD-10-CM

## 2023-03-21 DIAGNOSIS — G8311 Monoplegia of lower limb affecting right dominant side: Secondary | ICD-10-CM | POA: Diagnosis present

## 2023-03-21 DIAGNOSIS — N179 Acute kidney failure, unspecified: Secondary | ICD-10-CM | POA: Diagnosis not present

## 2023-03-21 DIAGNOSIS — F1120 Opioid dependence, uncomplicated: Secondary | ICD-10-CM | POA: Diagnosis present

## 2023-03-21 DIAGNOSIS — J449 Chronic obstructive pulmonary disease, unspecified: Secondary | ICD-10-CM | POA: Diagnosis present

## 2023-03-21 DIAGNOSIS — E1140 Type 2 diabetes mellitus with diabetic neuropathy, unspecified: Secondary | ICD-10-CM | POA: Diagnosis not present

## 2023-03-21 DIAGNOSIS — I10 Essential (primary) hypertension: Secondary | ICD-10-CM | POA: Diagnosis present

## 2023-03-21 DIAGNOSIS — R4182 Altered mental status, unspecified: Secondary | ICD-10-CM | POA: Diagnosis not present

## 2023-03-21 DIAGNOSIS — R4701 Aphasia: Secondary | ICD-10-CM | POA: Diagnosis present

## 2023-03-21 DIAGNOSIS — Z7401 Bed confinement status: Secondary | ICD-10-CM | POA: Diagnosis not present

## 2023-03-21 DIAGNOSIS — I6389 Other cerebral infarction: Secondary | ICD-10-CM | POA: Diagnosis not present

## 2023-03-21 DIAGNOSIS — F419 Anxiety disorder, unspecified: Secondary | ICD-10-CM | POA: Diagnosis present

## 2023-03-21 DIAGNOSIS — G9389 Other specified disorders of brain: Secondary | ICD-10-CM | POA: Diagnosis not present

## 2023-03-21 DIAGNOSIS — I63412 Cerebral infarction due to embolism of left middle cerebral artery: Secondary | ICD-10-CM

## 2023-03-21 DIAGNOSIS — I5021 Acute systolic (congestive) heart failure: Secondary | ICD-10-CM | POA: Diagnosis not present

## 2023-03-21 DIAGNOSIS — I639 Cerebral infarction, unspecified: Principal | ICD-10-CM | POA: Diagnosis present

## 2023-03-21 DIAGNOSIS — K219 Gastro-esophageal reflux disease without esophagitis: Secondary | ICD-10-CM | POA: Diagnosis not present

## 2023-03-21 DIAGNOSIS — Z9884 Bariatric surgery status: Secondary | ICD-10-CM

## 2023-03-21 DIAGNOSIS — Z8719 Personal history of other diseases of the digestive system: Secondary | ICD-10-CM | POA: Diagnosis not present

## 2023-03-21 DIAGNOSIS — F99 Mental disorder, not otherwise specified: Secondary | ICD-10-CM

## 2023-03-21 DIAGNOSIS — Z6835 Body mass index (BMI) 35.0-35.9, adult: Secondary | ICD-10-CM

## 2023-03-21 DIAGNOSIS — G8929 Other chronic pain: Secondary | ICD-10-CM | POA: Diagnosis present

## 2023-03-21 DIAGNOSIS — I82451 Acute embolism and thrombosis of right peroneal vein: Secondary | ICD-10-CM | POA: Diagnosis present

## 2023-03-21 DIAGNOSIS — I513 Intracardiac thrombosis, not elsewhere classified: Secondary | ICD-10-CM | POA: Diagnosis present

## 2023-03-21 DIAGNOSIS — J9811 Atelectasis: Secondary | ICD-10-CM | POA: Diagnosis not present

## 2023-03-21 DIAGNOSIS — Z800 Family history of malignant neoplasm of digestive organs: Secondary | ICD-10-CM

## 2023-03-21 DIAGNOSIS — N1832 Chronic kidney disease, stage 3b: Secondary | ICD-10-CM | POA: Diagnosis present

## 2023-03-21 DIAGNOSIS — R208 Other disturbances of skin sensation: Secondary | ICD-10-CM | POA: Diagnosis present

## 2023-03-21 DIAGNOSIS — G2581 Restless legs syndrome: Secondary | ICD-10-CM | POA: Diagnosis present

## 2023-03-21 DIAGNOSIS — Z86711 Personal history of pulmonary embolism: Secondary | ICD-10-CM | POA: Diagnosis not present

## 2023-03-21 DIAGNOSIS — E559 Vitamin D deficiency, unspecified: Secondary | ICD-10-CM | POA: Diagnosis present

## 2023-03-21 DIAGNOSIS — I63512 Cerebral infarction due to unspecified occlusion or stenosis of left middle cerebral artery: Principal | ICD-10-CM | POA: Diagnosis present

## 2023-03-21 DIAGNOSIS — Z5986 Financial insecurity: Secondary | ICD-10-CM

## 2023-03-21 DIAGNOSIS — I6523 Occlusion and stenosis of bilateral carotid arteries: Secondary | ICD-10-CM | POA: Diagnosis not present

## 2023-03-21 DIAGNOSIS — R4781 Slurred speech: Secondary | ICD-10-CM | POA: Diagnosis not present

## 2023-03-21 DIAGNOSIS — I517 Cardiomegaly: Secondary | ICD-10-CM | POA: Diagnosis not present

## 2023-03-21 DIAGNOSIS — E038 Other specified hypothyroidism: Secondary | ICD-10-CM | POA: Diagnosis not present

## 2023-03-21 DIAGNOSIS — Z7984 Long term (current) use of oral hypoglycemic drugs: Secondary | ICD-10-CM

## 2023-03-21 DIAGNOSIS — I2692 Saddle embolus of pulmonary artery without acute cor pulmonale: Secondary | ICD-10-CM | POA: Diagnosis not present

## 2023-03-21 DIAGNOSIS — I4891 Unspecified atrial fibrillation: Secondary | ICD-10-CM | POA: Diagnosis present

## 2023-03-21 DIAGNOSIS — I2510 Atherosclerotic heart disease of native coronary artery without angina pectoris: Secondary | ICD-10-CM | POA: Diagnosis present

## 2023-03-21 DIAGNOSIS — R1310 Dysphagia, unspecified: Secondary | ICD-10-CM | POA: Diagnosis present

## 2023-03-21 DIAGNOSIS — E7800 Pure hypercholesterolemia, unspecified: Secondary | ICD-10-CM

## 2023-03-21 DIAGNOSIS — N390 Urinary tract infection, site not specified: Secondary | ICD-10-CM | POA: Diagnosis not present

## 2023-03-21 DIAGNOSIS — E1142 Type 2 diabetes mellitus with diabetic polyneuropathy: Secondary | ICD-10-CM | POA: Diagnosis present

## 2023-03-21 DIAGNOSIS — Z96651 Presence of right artificial knee joint: Secondary | ICD-10-CM | POA: Diagnosis present

## 2023-03-21 DIAGNOSIS — I5189 Other ill-defined heart diseases: Secondary | ICD-10-CM | POA: Diagnosis not present

## 2023-03-21 DIAGNOSIS — K769 Liver disease, unspecified: Secondary | ICD-10-CM | POA: Diagnosis not present

## 2023-03-21 DIAGNOSIS — I700 Atherosclerosis of aorta: Secondary | ICD-10-CM | POA: Diagnosis present

## 2023-03-21 DIAGNOSIS — F17210 Nicotine dependence, cigarettes, uncomplicated: Secondary | ICD-10-CM | POA: Diagnosis present

## 2023-03-21 DIAGNOSIS — I2489 Other forms of acute ischemic heart disease: Secondary | ICD-10-CM | POA: Diagnosis present

## 2023-03-21 DIAGNOSIS — Z888 Allergy status to other drugs, medicaments and biological substances status: Secondary | ICD-10-CM

## 2023-03-21 DIAGNOSIS — Q2112 Patent foramen ovale: Secondary | ICD-10-CM

## 2023-03-21 DIAGNOSIS — Z833 Family history of diabetes mellitus: Secondary | ICD-10-CM

## 2023-03-21 DIAGNOSIS — D631 Anemia in chronic kidney disease: Secondary | ICD-10-CM | POA: Diagnosis present

## 2023-03-21 DIAGNOSIS — M549 Dorsalgia, unspecified: Secondary | ICD-10-CM | POA: Diagnosis not present

## 2023-03-21 DIAGNOSIS — Z7901 Long term (current) use of anticoagulants: Secondary | ICD-10-CM

## 2023-03-21 DIAGNOSIS — J90 Pleural effusion, not elsewhere classified: Secondary | ICD-10-CM | POA: Diagnosis not present

## 2023-03-21 DIAGNOSIS — H5509 Other forms of nystagmus: Secondary | ICD-10-CM | POA: Diagnosis present

## 2023-03-21 DIAGNOSIS — J439 Emphysema, unspecified: Secondary | ICD-10-CM | POA: Diagnosis present

## 2023-03-21 DIAGNOSIS — R918 Other nonspecific abnormal finding of lung field: Secondary | ICD-10-CM | POA: Diagnosis not present

## 2023-03-21 DIAGNOSIS — E876 Hypokalemia: Secondary | ICD-10-CM | POA: Diagnosis present

## 2023-03-21 DIAGNOSIS — Z886 Allergy status to analgesic agent status: Secondary | ICD-10-CM

## 2023-03-21 DIAGNOSIS — R000 Tachycardia, unspecified: Secondary | ICD-10-CM | POA: Diagnosis not present

## 2023-03-21 DIAGNOSIS — Z8249 Family history of ischemic heart disease and other diseases of the circulatory system: Secondary | ICD-10-CM

## 2023-03-21 DIAGNOSIS — I63541 Cerebral infarction due to unspecified occlusion or stenosis of right cerebellar artery: Secondary | ICD-10-CM | POA: Diagnosis present

## 2023-03-21 DIAGNOSIS — G459 Transient cerebral ischemic attack, unspecified: Secondary | ICD-10-CM | POA: Diagnosis not present

## 2023-03-21 DIAGNOSIS — K559 Vascular disorder of intestine, unspecified: Secondary | ICD-10-CM | POA: Diagnosis present

## 2023-03-21 DIAGNOSIS — Z818 Family history of other mental and behavioral disorders: Secondary | ICD-10-CM

## 2023-03-21 DIAGNOSIS — F319 Bipolar disorder, unspecified: Secondary | ICD-10-CM | POA: Diagnosis present

## 2023-03-21 DIAGNOSIS — I493 Ventricular premature depolarization: Secondary | ICD-10-CM | POA: Diagnosis present

## 2023-03-21 DIAGNOSIS — I130 Hypertensive heart and chronic kidney disease with heart failure and stage 1 through stage 4 chronic kidney disease, or unspecified chronic kidney disease: Secondary | ICD-10-CM | POA: Diagnosis present

## 2023-03-21 DIAGNOSIS — Z981 Arthrodesis status: Secondary | ICD-10-CM | POA: Diagnosis not present

## 2023-03-21 DIAGNOSIS — Z79899 Other long term (current) drug therapy: Secondary | ICD-10-CM

## 2023-03-21 DIAGNOSIS — Z5982 Transportation insecurity: Secondary | ICD-10-CM

## 2023-03-21 DIAGNOSIS — G9349 Other encephalopathy: Secondary | ICD-10-CM | POA: Diagnosis present

## 2023-03-21 DIAGNOSIS — Z1389 Encounter for screening for other disorder: Secondary | ICD-10-CM | POA: Diagnosis not present

## 2023-03-21 DIAGNOSIS — R4189 Other symptoms and signs involving cognitive functions and awareness: Secondary | ICD-10-CM | POA: Diagnosis present

## 2023-03-21 DIAGNOSIS — Z823 Family history of stroke: Secondary | ICD-10-CM

## 2023-03-21 DIAGNOSIS — I77810 Thoracic aortic ectasia: Secondary | ICD-10-CM | POA: Diagnosis present

## 2023-03-21 DIAGNOSIS — Z7902 Long term (current) use of antithrombotics/antiplatelets: Secondary | ICD-10-CM

## 2023-03-21 DIAGNOSIS — E785 Hyperlipidemia, unspecified: Secondary | ICD-10-CM | POA: Diagnosis not present

## 2023-03-21 DIAGNOSIS — Z7951 Long term (current) use of inhaled steroids: Secondary | ICD-10-CM

## 2023-03-21 DIAGNOSIS — I2602 Saddle embolus of pulmonary artery with acute cor pulmonale: Secondary | ICD-10-CM | POA: Diagnosis not present

## 2023-03-21 DIAGNOSIS — I6782 Cerebral ischemia: Secondary | ICD-10-CM | POA: Diagnosis not present

## 2023-03-21 DIAGNOSIS — R29712 NIHSS score 12: Secondary | ICD-10-CM | POA: Diagnosis present

## 2023-03-21 DIAGNOSIS — Z515 Encounter for palliative care: Secondary | ICD-10-CM | POA: Diagnosis not present

## 2023-03-21 DIAGNOSIS — Z91030 Bee allergy status: Secondary | ICD-10-CM

## 2023-03-21 DIAGNOSIS — R32 Unspecified urinary incontinence: Secondary | ICD-10-CM | POA: Diagnosis present

## 2023-03-21 DIAGNOSIS — I82411 Acute embolism and thrombosis of right femoral vein: Secondary | ICD-10-CM | POA: Diagnosis present

## 2023-03-21 DIAGNOSIS — I471 Supraventricular tachycardia, unspecified: Secondary | ICD-10-CM

## 2023-03-21 LAB — COMPREHENSIVE METABOLIC PANEL
ALT: 14 U/L (ref 0–44)
AST: 25 U/L (ref 15–41)
Albumin: 3.1 g/dL — ABNORMAL LOW (ref 3.5–5.0)
Alkaline Phosphatase: 153 U/L — ABNORMAL HIGH (ref 38–126)
Anion gap: 13 (ref 5–15)
BUN: 19 mg/dL (ref 8–23)
CO2: 21 mmol/L — ABNORMAL LOW (ref 22–32)
Calcium: 9.1 mg/dL (ref 8.9–10.3)
Chloride: 104 mmol/L (ref 98–111)
Creatinine, Ser: 1.68 mg/dL — ABNORMAL HIGH (ref 0.44–1.00)
GFR, Estimated: 34 mL/min — ABNORMAL LOW (ref >60)
Glucose, Bld: 112 mg/dL — ABNORMAL HIGH (ref 70–99)
Potassium: 4.1 mmol/L (ref 3.5–5.1)
Sodium: 138 mmol/L (ref 135–145)
Total Bilirubin: 1.1 mg/dL (ref 0.3–1.2)
Total Protein: 7.5 g/dL (ref 6.5–8.1)

## 2023-03-21 LAB — DIFFERENTIAL
Abs Immature Granulocytes: 0.03 10*3/uL (ref 0.00–0.07)
Basophils Absolute: 0 10*3/uL (ref 0.0–0.1)
Basophils Relative: 0 %
Eosinophils Absolute: 0 10*3/uL (ref 0.0–0.5)
Eosinophils Relative: 0 %
Immature Granulocytes: 0 %
Lymphocytes Relative: 13 %
Lymphs Abs: 1.3 10*3/uL (ref 0.7–4.0)
Monocytes Absolute: 0.6 10*3/uL (ref 0.1–1.0)
Monocytes Relative: 6 %
Neutro Abs: 7.8 10*3/uL — ABNORMAL HIGH (ref 1.7–7.7)
Neutrophils Relative %: 81 %

## 2023-03-21 LAB — CBC
HCT: 33.9 % — ABNORMAL LOW (ref 36.0–46.0)
Hemoglobin: 10.4 g/dL — ABNORMAL LOW (ref 12.0–15.0)
MCH: 26.3 pg (ref 26.0–34.0)
MCHC: 30.7 g/dL (ref 30.0–36.0)
MCV: 85.8 fL (ref 80.0–100.0)
Platelets: 366 10*3/uL (ref 150–400)
RBC: 3.95 MIL/uL (ref 3.87–5.11)
RDW: 16.5 % — ABNORMAL HIGH (ref 11.5–15.5)
WBC: 9.7 10*3/uL (ref 4.0–10.5)
nRBC: 0 % (ref 0.0–0.2)

## 2023-03-21 LAB — APTT: aPTT: 30 s (ref 24–36)

## 2023-03-21 LAB — I-STAT CHEM 8, ED
BUN: 20 mg/dL (ref 8–23)
Calcium, Ion: 1.14 mmol/L — ABNORMAL LOW (ref 1.15–1.40)
Chloride: 107 mmol/L (ref 98–111)
Creatinine, Ser: 1.6 mg/dL — ABNORMAL HIGH (ref 0.44–1.00)
Glucose, Bld: 112 mg/dL — ABNORMAL HIGH (ref 70–99)
HCT: 35 % — ABNORMAL LOW (ref 36.0–46.0)
Hemoglobin: 11.9 g/dL — ABNORMAL LOW (ref 12.0–15.0)
Potassium: 4.1 mmol/L (ref 3.5–5.1)
Sodium: 140 mmol/L (ref 135–145)
TCO2: 22 mmol/L (ref 22–32)

## 2023-03-21 LAB — ETHANOL: Alcohol, Ethyl (B): <10 mg/dL (ref <10)

## 2023-03-21 LAB — CBG MONITORING, ED: Glucose-Capillary: 94 mg/dL (ref 70–99)

## 2023-03-21 LAB — PROTIME-INR
INR: 1 (ref 0.8–1.2)
Prothrombin Time: 13.6 s (ref 11.4–15.2)

## 2023-03-21 MED ORDER — SODIUM CHLORIDE 0.9% FLUSH: 3.0000 mL | Freq: Once | INTRAVENOUS | Status: DC

## 2023-03-21 MED ORDER — STROKE: EARLY STAGES OF RECOVERY BOOK
Freq: Once | Status: AC
  Filled 2023-03-21: qty 1

## 2023-03-21 NOTE — ED Notes (Signed)
Patient taken to MRI at this time 
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
## 2023-03-21 NOTE — ED Notes (Signed)
Pt currently in MRI

## 2023-03-21 NOTE — ED Notes (Signed)
Pt to CT

## 2023-03-21 NOTE — H&P (Signed)
History and Physical    Patient: Caroline Williams HYQ:[id number] DOB: 1960/09/25 DOA: 03/21/2023 DOS: the patient was seen and examined on 03/21/2023 PCP: Hoy Register, MD  Patient coming from: {Point_of_Origin:[address]}  Chief Complaint:  Chief Complaint  Patient presents with   Aphasia   HPI: Caroline Williams is a 62 y.o. female with medical history significant of ***  Review of Systems: {ROS_Text:26778} Past Medical History:  Diagnosis Date   Anxiety    Arthritis    Asthma    Bipolar 1 disorder (HCC)    Breast discharge    Breast lump    Breast pain    CAD (coronary artery disease) 12/03/2022    o Cath 2008: no CAD   o CCTA 10/05/19: CAC score 49, 88th percentile, nonobstructive CAD (LM 1-24 mid and dist)  o Myoview 12/31/20: EF 45, no ischemia or infarction, low risk    Chronic pain    on MS Contin and oxycodone   COPD (chronic obstructive pulmonary disease) (HCC)    Depression    Diabetes mellitus    diet and exercise controlled   Dysrhythmia    HR in 30s on metoprolol   Escherichia coli (E. coli) infection    Fever    GERD (gastroesophageal reflux disease)    History of chest pain    History of knee replacement, total    Hypertension    Hypothyroidism    N&V (nausea and vomiting)    Peripheral neuropathy    Right foot no sensation   Sciatica    Sleep apnea    does not use CPAP   Thyroid disease    Wears glasses    Past Surgical History:  Procedure Laterality Date   ABDOMINAL HYSTERECTOMY     partial   APPENDECTOMY     BIOPSY  03/14/2018   Procedure: BIOPSY;  Surgeon: Charlott Rakes, MD;  Location: [facility];  Service: Endoscopy;;   BIOPSY  02/05/2023   Procedure: BIOPSY;  Surgeon: Charlott Rakes, MD;  Location: [facility] ENDOSCOPY;  Service: Gastroenterology;;   BREAST DUCTAL SYSTEM EXCISION Right 08/31/2017   Procedure: RIGHT BREAST CENTRAL DUCT EXCISION;  Surgeon: Harriette Bouillon, MD;  Location: [facility];  Service: General;   Laterality: Right;   BREAST EXCISIONAL BIOPSY Right    BREAST LUMPECTOMY     right   CARDIAC CATHETERIZATION     no significant CAD, nl LV function by 11/08/06 cath   CESAREAN SECTION     x4   COLONOSCOPY WITH PROPOFOL N/A 03/14/2018   Procedure: COLONOSCOPY WITH PROPOFOL;  Surgeon: Charlott Rakes, MD;  Location: [facility];  Service: Endoscopy;  Laterality: N/A;   COLONOSCOPY WITH PROPOFOL Left 02/05/2023   Procedure: COLONOSCOPY WITH PROPOFOL;  Surgeon: Charlott Rakes, MD;  Location: [facility] ENDOSCOPY;  Service: Gastroenterology;  Laterality: Left;   ESOPHAGOGASTRODUODENOSCOPY (EGD) WITH PROPOFOL Left 01/31/2023   Procedure: ESOPHAGOGASTRODUODENOSCOPY (EGD) WITH PROPOFOL;  Surgeon: Willis Modena, MD;  Location: [facility] ENDOSCOPY;  Service: Gastroenterology;  Laterality: Left;   GASTRIC ROUX-EN-Y N/A 11/25/2014   Procedure: LAPAROSCOPIC ROUX-EN-Y GASTRIC BYPASS WITH UPPER ENDOSCOPY;  Surgeon: Luretha Murphy, MD;  Location: [facility];  Service: General;  Laterality: N/A;   HERNIA REPAIR     left inguinal   JOINT REPLACEMENT     KNEE SURGERY     right knee   MOUTH SURGERY     MYOMECTOMY ABDOMINAL APPROACH  1989   POLYPECTOMY  03/14/2018   Procedure: POLYPECTOMY;  Surgeon: Charlott Rakes, MD;  Location: [facility];  Service: Endoscopy;;   TOTAL KNEE REVISION  06/21/2012   Procedure: TOTAL KNEE REVISION;  Surgeon: Nadara Mustard, MD;  Location: [facility];  Service: Orthopedics;  Laterality: Right;  Revision Right Total Knee Arthroplasty   Social History:  reports that she quit smoking about 4 years ago. Her smoking use included cigarettes. She started smoking about 47 years ago. She has a 11 pack-year smoking history. She has never used smokeless tobacco. She reports that she does not currently use alcohol. She reports that she does not use drugs.  Allergies  Allergen Reactions   Aspirin Other (See Comments), Shortness Of Breath and Anaphylaxis    Pt states she has had Toradol several times  without any reactions  Other Reaction(s): Other (See Comments), respiratory distress   Bee Venom Anaphylaxis   Sulfa Antibiotics Anaphylaxis   Evolocumab     Other Reaction(s): Unknown   Nsaids Other (See Comments)    History of gastric bypass   Other Other (See Comments)    No blood products.  Patient did  Request that only albumin or albumin-containing products may be administered    Family History  Problem Relation Age of Onset   Cancer Father        colon   Stroke Father    Heart disease Father    Diabetes Father    Hypertension Father    Depression Sister    Hypertension Mother    Breast cancer Neg Hx     Prior to Admission medications   Medication Sig Start Date End Date Taking? Authorizing Provider  albuterol (VENTOLIN HFA) 108 (90 Base) MCG/ACT inhaler INHALE 2 PUFFS INTO THE LUNGS EVERY 6 HOURS AS NEEDED FOR WHEEZING OR SHORTNESS OF BREATH 09/23/21   Hoy Register, MD  Alirocumab (PRALUENT) 150 MG/ML SOAJ INJECT 1 PEN INTO SKIN EVERY 14 DAYS. 12/03/22   Tereso Newcomer T, PA-C  ALPRAZolam Prudy Feeler) 1 MG tablet Take 1 mg by mouth 3 (three) times daily as needed for anxiety. 07/02/14   [provider]  amphetamine-dextroamphetamine (ADDERALL XR) 30 MG 24 hr capsule Take 30 mg by mouth daily.  03/10/18   [provider]  amphetamine-dextroamphetamine (ADDERALL) 20 MG tablet Take 20 mg by mouth every evening. 10/13/17   [provider]  Blood Glucose Monitoring Suppl (ACCU-CHEK GUIDE) w/Device KIT 1 kit by Does not apply route 3 (three) times daily. To check blood sugars 04/10/21   Hoy Register, MD  cetirizine (ZYRTEC) 10 MG tablet Take 1 tablet (10 mg total) by mouth daily. 04/07/21   Hoy Register, MD  Cyanocobalamin (VITAMIN B 12 PO) Take 1 tablet by mouth daily. Unsure of dose    [provider]  cyclobenzaprine (FLEXERIL) 10 MG tablet Take 1 tablet (10 mg total) by mouth 3 (three) times daily as needed for muscle spasms. 01/14/23   Tressie Stalker, MD  diclofenac Sodium (VOLTAREN) 1 % GEL Apply 2-4 g topically 2 (two) times daily as needed (wrist/knee pain). 11/23/21   Hoy Register, MD  docusate sodium (COLACE) 100 MG capsule Take 1 capsule (100 mg total) by mouth 2 (two) times daily. 01/14/23   Tressie Stalker, MD  doxepin (SINEQUAN) 75 MG capsule Take 75 mg by mouth at bedtime. 03/02/18   [provider]  FARXIGA 5 MG TABS tablet TAKE ONE TABLET BY MOUTH DAILY BEFORE BREAKFAST 03/17/23   Hoy Register, MD  fluticasone (FLONASE) 50 MCG/ACT nasal spray SHAKE LIQUID AND USE TWO SPRAYS IN EACH NOSTRIL DAILY 12/21/22  Hoy Register, MD  glucose blood (ACCU-CHEK GUIDE) test strip Use as instructed 04/10/21   Hoy Register, MD  hydrocortisone-pramoxine (ANALPRAM HC) 2.5-1 % rectal cream Place 1 application rectally 3 (three) times daily. 07/13/21   Hoy Register, MD  isosorbide mononitrate (IMDUR) 30 MG 24 hr tablet Take 1 tablet (30 mg total) by mouth daily. 12/11/20   Lyn Records, MD  Lancets (ACCU-CHEK MULTICLIX) lancets Use as instructed 04/10/21   Hoy Register, MD  levothyroxine (SYNTHROID) 150 MCG tablet TAKE 1 TABLET(150 MCG) BY MOUTH DAILY BEFORE BREAKFAST 03/18/23   Hoy Register, MD  linaclotide (LINZESS) 290 MCG CAPS capsule Take 290 mcg by mouth See admin instructions. Every other night    [provider]  morphine (MS CONTIN) 15 MG 12 hr tablet Take 1 tablet (15 mg total) by mouth every 12 (twelve) hours. 11/14/14   Jones Bales, NP  nabumetone (RELAFEN) 750 MG tablet Take 750 mg by mouth daily.    [provider]  naloxone [facility]) nasal spray 4 mg/0.1 mL Place 1 spray into the nose as needed (overdose). 05/22/20   [provider]  nicotine (NICODERM CQ - DOSED IN MG/24 HR) 7 mg/24hr patch Place 1 patch (7 mg total) onto the skin daily. 02/09/23   Pokhrel, Rebekah Chesterfield, MD  nitroGLYCERIN (NITROSTAT) 0.4 MG SL tablet DISSOLVE 1 TABLET UNDER THE TONGUE EVERY 5 MINUTES AS NEEDED 12/03/22    Tereso Newcomer T, PA-C  Olmesartan-amLODIPine-HCTZ 40-5-25 MG TABS Take 1 tablet by mouth daily. 12/08/22   Hoy Register, MD  omeprazole (PRILOSEC) 40 MG capsule TAKE 1 CAPSULE(40 MG) BY MOUTH DAILY 01/22/20   Fulp, Cammie, MD  ondansetron (ZOFRAN) 4 MG tablet Take 1 tablet (4 mg total) by mouth every 6 (six) hours as needed for nausea. 02/08/23   Pokhrel, Rebekah Chesterfield, MD  oxyCODONE-acetaminophen (PERCOCET/ROXICET) 5-325 MG tablet Take 1-2 tablets by mouth every 4 (four) hours as needed for moderate pain. 01/14/23   Tressie Stalker, MD  promethazine (PHENERGAN) 25 MG tablet Take 1 tablet (25 mg total) by mouth every 8 (eight) hours as needed for nausea or vomiting. 12/24/22   Delorse Lek, FNP  REXULTI 2 MG TABS tablet Take 2 mg by mouth daily.    [provider]  rOPINIRole (REQUIP) 2 MG tablet Take 1 tablet (2 mg total) by mouth at bedtime. 02/08/23 03/10/23  Pokhrel, Rebekah Chesterfield, MD  solifenacin (VESICARE) 5 MG tablet TAKE ONE TABLET BY MOUTH DAILY 03/17/23   Hoy Register, MD  spironolactone (ALDACTONE) 25 MG tablet Take 0.5 tablets (12.5 mg total) by mouth daily. 12/08/22   Hoy Register, MD  umeclidinium-vilanterol (ANORO ELLIPTA) 62.5-25 MCG/INH AEPB Inhale 1 puff into the lungs daily. 04/17/19   Waymon Budge, MD  Vitamin D, Ergocalciferol, (DRISDOL) 1.25 MG (50000 UNIT) CAPS capsule Take 1 capsule (50,000 Units total) by mouth every 7 (seven) days. 02/09/23   Pokhrel, Rebekah Chesterfield, MD  vortioxetine HBr (TRINTELLIX) 20 MG TABS tablet Take 20 mg by mouth daily.    [provider]  escitalopram (LEXAPRO) 20 MG tablet Take 20 mg by mouth daily.  12/14/18  [provider]  LATUDA 120 MG TABS Take 120 mg by mouth at bedtime.  03/04/18 12/14/18  [provider]  LISINOPRIL PO Take by mouth daily.    09/06/11  [provider]  metoCLOPramide (REGLAN) 10 MG tablet Take 1 tablet (10 mg total) by mouth every 8 (eight) hours as needed for nausea. Patient not taking: Reported on  12/06/2019 06/30/18  05/12/20  Mesner, Barbara Cower, MD    Physical Exam: Vitals:   03/21/23 1623 03/21/23 1644 03/21/23 2053  BP:  (!) 142/100 (!) 147/102  Pulse:  (!) 125 (!) 114  Resp:  17 17  Temp:  97.6 F (36.4 C) (!) 97.5 F (36.4 C)  TempSrc:  Oral Oral  SpO2: 95% 98% 100%   *** Data Reviewed: {Tip this will not be part of the note when signed- Document your independent interpretation of telemetry tracing, EKG, lab, Radiology test or any other diagnostic tests. Add any new diagnostic test ordered today. (Optional):26781} {Results:26384}  Assessment and Plan: No notes have been filed under this hospital service. Service: Hospitalist     Advance Care Planning:   Code Status: Full Code ***  Consults: ***  Family Communication: ***  Severity of Illness: {Observation/Inpatient:21159}  Author: Lilyan Gilford, DO 03/21/2023 10:34 PM  For on call review [email or web address].

## 2023-03-21 NOTE — ED Triage Notes (Signed)
Pt arrives via EMS from home. Patient's friend ( who lives with her) reported she woke up this morning with difficulty speaking and slurred speech. Pt appears to have expressive aphasia. Pt is confused. Last known normal was 1500 yesterday.

## 2023-03-21 NOTE — ED Provider Notes (Signed)
[place] [facility]GENC[facility] DEPARTMENT AT [facility] Provider Note   CSN: [id number] Arrival date & time: 03/21/23  1617     History {Add pertinent medical, surgical, social history, OB history to HPI:1} Chief Complaint  Patient presents with   Aphasia    Caroline Williams is a 62 y.o. female.  HPI Patient presents with speech difficulty.  History is difficult to obtain as the patient has clear speech deficit, level 5 caveat. Patient arrives via EMS, and those individuals wrote that the patient's friend noted she was last seen normal 3 PM yesterday, almost 26 hours prior to ED arrival.  Patient is trying to speak in animated fashion, with unintelligible responses.  However, when asked to simply nod, she seemingly denies pain, acknowledges that she knows where she is, acknowledges that she feels differently than normal. Per EMS report the patient had evidence for expressive aphasia, no EMS report of abnormal vital signs.    Home Medications Prior to Admission medications   Medication Sig Start Date End Date Taking? Authorizing Provider  albuterol (VENTOLIN HFA) 108 (90 Base) MCG/ACT inhaler INHALE 2 PUFFS INTO THE LUNGS EVERY 6 HOURS AS NEEDED FOR WHEEZING OR SHORTNESS OF BREATH 09/23/21   Hoy Register, MD  Alirocumab (PRALUENT) 150 MG/ML SOAJ INJECT 1 PEN INTO SKIN EVERY 14 DAYS. 12/03/22   Tereso Newcomer T, PA-C  ALPRAZolam Prudy Feeler) 1 MG tablet Take 1 mg by mouth 3 (three) times daily as needed for anxiety. 07/02/14   [provider]  amphetamine-dextroamphetamine (ADDERALL XR) 30 MG 24 hr capsule Take 30 mg by mouth daily.  03/10/18   [provider]  amphetamine-dextroamphetamine (ADDERALL) 20 MG tablet Take 20 mg by mouth every evening. 10/13/17   [provider]  Blood Glucose Monitoring Suppl (ACCU-CHEK GUIDE) w/Device KIT 1 kit by Does not apply route 3 (three) times daily. To check blood sugars 04/10/21   Hoy Register, MD  cetirizine (ZYRTEC) 10 MG  tablet Take 1 tablet (10 mg total) by mouth daily. 04/07/21   Hoy Register, MD  Cyanocobalamin (VITAMIN B 12 PO) Take 1 tablet by mouth daily. Unsure of dose    [provider]  cyclobenzaprine (FLEXERIL) 10 MG tablet Take 1 tablet (10 mg total) by mouth 3 (three) times daily as needed for muscle spasms. 01/14/23   Tressie Stalker, MD  diclofenac Sodium (VOLTAREN) 1 % GEL Apply 2-4 g topically 2 (two) times daily as needed (wrist/knee pain). 11/23/21   Hoy Register, MD  docusate sodium (COLACE) 100 MG capsule Take 1 capsule (100 mg total) by mouth 2 (two) times daily. 01/14/23   Tressie Stalker, MD  doxepin (SINEQUAN) 75 MG capsule Take 75 mg by mouth at bedtime. 03/02/18   [provider]  FARXIGA 5 MG TABS tablet TAKE ONE TABLET BY MOUTH DAILY BEFORE BREAKFAST 03/17/23   Newlin, Enobong, MD  fluticasone (FLONASE) 50 MCG/ACT nasal spray SHAKE LIQUID AND USE TWO SPRAYS IN EACH NOSTRIL DAILY 12/21/22   Hoy Register, MD  glucose blood (ACCU-CHEK GUIDE) test strip Use as instructed 04/10/21   Hoy Register, MD  hydrocortisone-pramoxine (ANALPRAM HC) 2.5-1 % rectal cream Place 1 application rectally 3 (three) times daily. 07/13/21   Hoy Register, MD  isosorbide mononitrate (IMDUR) 30 MG 24 hr tablet Take 1 tablet (30 mg total) by mouth daily. 12/11/20   Lyn Records, MD  Lancets (ACCU-CHEK MULTICLIX) lancets Use as instructed 04/10/21   Hoy Register, MD  levothyroxine (SYNTHROID) 150 MCG tablet TAKE 1 TABLET(150  MCG) BY MOUTH DAILY BEFORE BREAKFAST 03/18/23   Hoy Register, MD  linaclotide (LINZESS) 290 MCG CAPS capsule Take 290 mcg by mouth See admin instructions. Every other night    [provider]  morphine (MS CONTIN) 15 MG 12 hr tablet Take 1 tablet (15 mg total) by mouth every 12 (twelve) hours. 11/14/14   Jones Bales, NP  nabumetone (RELAFEN) 750 MG tablet Take 750 mg by mouth daily.    [provider]  naloxone [facility]) nasal spray 4 mg/0.1 mL  Place 1 spray into the nose as needed (overdose). 05/22/20   [provider]  nicotine (NICODERM CQ - DOSED IN MG/24 HR) 7 mg/24hr patch Place 1 patch (7 mg total) onto the skin daily. 02/09/23   Pokhrel, Rebekah Chesterfield, MD  nitroGLYCERIN (NITROSTAT) 0.4 MG SL tablet DISSOLVE 1 TABLET UNDER THE TONGUE EVERY 5 MINUTES AS NEEDED 12/03/22   Tereso Newcomer T, PA-C  Olmesartan-amLODIPine-HCTZ 40-5-25 MG TABS Take 1 tablet by mouth daily. 12/08/22   Hoy Register, MD  omeprazole (PRILOSEC) 40 MG capsule TAKE 1 CAPSULE(40 MG) BY MOUTH DAILY 01/22/20   Fulp, Cammie, MD  ondansetron (ZOFRAN) 4 MG tablet Take 1 tablet (4 mg total) by mouth every 6 (six) hours as needed for nausea. 02/08/23   Pokhrel, Rebekah Chesterfield, MD  oxyCODONE-acetaminophen (PERCOCET/ROXICET) 5-325 MG tablet Take 1-2 tablets by mouth every 4 (four) hours as needed for moderate pain. 01/14/23   Tressie Stalker, MD  promethazine (PHENERGAN) 25 MG tablet Take 1 tablet (25 mg total) by mouth every 8 (eight) hours as needed for nausea or vomiting. 12/24/22   Delorse Lek, FNP  REXULTI 2 MG TABS tablet Take 2 mg by mouth daily.    [provider]  rOPINIRole (REQUIP) 2 MG tablet Take 1 tablet (2 mg total) by mouth at bedtime. 02/08/23 03/10/23  Pokhrel, Rebekah Chesterfield, MD  solifenacin (VESICARE) 5 MG tablet TAKE ONE TABLET BY MOUTH DAILY 03/17/23   Hoy Register, MD  spironolactone (ALDACTONE) 25 MG tablet Take 0.5 tablets (12.5 mg total) by mouth daily. 12/08/22   Hoy Register, MD  umeclidinium-vilanterol (ANORO ELLIPTA) 62.5-25 MCG/INH AEPB Inhale 1 puff into the lungs daily. 04/17/19   Waymon Budge, MD  Vitamin D, Ergocalciferol, (DRISDOL) 1.25 MG (50000 UNIT) CAPS capsule Take 1 capsule (50,000 Units total) by mouth every 7 (seven) days. 02/09/23   Pokhrel, Rebekah Chesterfield, MD  vortioxetine HBr (TRINTELLIX) 20 MG TABS tablet Take 20 mg by mouth daily.    [provider]  escitalopram (LEXAPRO) 20 MG tablet Take 20 mg by mouth daily.  12/14/18  [provider]  LATUDA 120 MG TABS Take 120 mg by mouth at bedtime.  03/04/18 12/14/18  [provider]  LISINOPRIL PO Take by mouth daily.    09/06/11  [provider]  metoCLOPramide (REGLAN) 10 MG tablet Take 1 tablet (10 mg total) by mouth every 8 (eight) hours as needed for nausea. Patient not taking: Reported on 12/06/2019 06/30/18 05/12/20  Mesner, Barbara Cower, MD      Allergies    Aspirin, Bee venom, Sulfa antibiotics, Evolocumab, Nsaids, and Other    Review of Systems   Review of Systems  Unable to perform ROS: Acuity of condition    Physical Exam Updated Vital Signs BP (!) 147/102   Pulse (!) 114   Temp (!) 97.5 F (36.4 C) (Oral)   Resp 17   SpO2 100%  Physical Exam Vitals and nursing note reviewed.  Constitutional:  General: She is not in acute distress.    Appearance: She is well-developed.  HENT:     Head: Normocephalic and atraumatic.  Eyes:     Conjunctiva/sclera: Conjunctivae normal.  Cardiovascular:     Rate and Rhythm: Regular rhythm. Tachycardia present.  Pulmonary:     Effort: Pulmonary effort is normal. No respiratory distress.     Breath sounds: Normal breath sounds. No stridor.  Abdominal:     General: There is no distension.  Skin:    General: Skin is warm and dry.  Neurological:     Mental Status: She is alert.     Comments: No facial asymmetry, and the patient is speaking in a very animated fashion, though with very unintelligible speech with only occasional clear words.  However, when asked to pause, listen only to simple questions that require yes no response, the patient seems to nod in an appropriate manner.  No Romberg, she moves all extremities appropriately to command.  Psychiatric:        Mood and Affect: Mood normal.     ED Results / Procedures / Treatments   Labs (all labs ordered are listed, but only abnormal results are displayed) Labs Reviewed  CBC - Abnormal; Notable for the following components:      Result  Value   Hemoglobin 10.4 (*)    HCT 33.9 (*)    RDW 16.5 (*)    All other components within normal limits  DIFFERENTIAL - Abnormal; Notable for the following components:   Neutro Abs 7.8 (*)    All other components within normal limits  COMPREHENSIVE METABOLIC PANEL - Abnormal; Notable for the following components:   CO2 21 (*)    Glucose, Bld 112 (*)    Creatinine, Ser 1.68 (*)    Albumin 3.1 (*)    Alkaline Phosphatase 153 (*)    GFR, Estimated 34 (*)    All other components within normal limits  I-STAT CHEM 8, ED - Abnormal; Notable for the following components:   Creatinine, Ser 1.60 (*)    Glucose, Bld 112 (*)    Calcium, Ion 1.14 (*)    Hemoglobin 11.9 (*)    HCT 35.0 (*)    All other components within normal limits  PROTIME-INR  APTT  ETHANOL  CBG MONITORING, ED    EKG None  Radiology CT HEAD WO CONTRAST  Result Date: 03/21/2023 CLINICAL DATA:  Neurological deficit. EXAM: CT HEAD WITHOUT CONTRAST TECHNIQUE: Contiguous axial images were obtained from the base of the skull through the vertex without intravenous contrast. RADIATION DOSE REDUCTION: This exam was performed according to the departmental dose-optimization program which includes automated exposure control, adjustment of the mA and/or kV according to patient size and/or use of iterative reconstruction technique. COMPARISON:  None Available. FINDINGS: Brain: There is mild cerebral atrophy with widening of the extra-axial spaces and ventricular dilatation. There are areas of decreased attenuation within the white matter tracts of the supratentorial brain, consistent with microvascular disease changes. Chronic right frontal lobe and left posterior parietal infarcts are seen. Vascular: No hyperdense vessel or unexpected calcification. Skull: Normal. Negative for fracture or focal lesion. Sinuses/Orbits: No acute finding. Other: None. IMPRESSION: 1. Generalized cerebral atrophy with chronic white matter small vessel  ischemic changes. 2. Chronic right frontal lobe and left posterior parietal infarcts. 3. No acute intracranial abnormality. Electronically Signed   By: Aram Candela M.D.   On: 03/21/2023 17:49    Procedures Procedures  {Document cardiac monitor, telemetry assessment procedure  when appropriate:1}  Medications Ordered in ED Medications  sodium chloride flush (NS) 0.9 % injection 3 mL (has no administration in time range)    ED Course/ Medical Decision Making/ A&P   {   Click here for ABCD2, HEART and other calculatorsREFRESH Note before signing :1}                              Medical Decision Making Adult female presents with speech difficulty.  Clear concern for stroke, though given the reported last seen normal time she is well out of the window for acute interventions.  However, after my initial evaluation, reviewing of her head CT, labs, I discussed her case with her neurology team.  Labs notable for waxing, waning kidney dysfunction, as well as other minor abnormalities.  CT with evidence for prior strokes.  Given these concerns, compressive aphasia, patient will have MRI, will require admission for ongoing stroke evaluation.  Amount and/or Complexity of Data Reviewed External Data Reviewed: notes. Labs:  Decision-making details documented in ED Course. Radiology: ordered and independent interpretation performed. Decision-making details documented in ED Course.  Risk Decision regarding hospitalization. Diagnosis or treatment significantly limited by social determinants of health.   Update: Patient in similar condition on repeat exam.  {Document critical care time when appropriate:1} {Document review of labs and clinical decision tools ie heart score, Chads2Vasc2 etc:1}  {Document your independent review of radiology images, and any outside records:1} {Document your discussion with family members, caretakers, and with consultants:1} {Document social determinants of health  affecting pt's care:1} {Document your decision making why or why not admission, treatments were needed:1} Final Clinical Impression(s) / ED Diagnoses Final diagnoses:  None    Rx / DC Orders ED Discharge Orders     None

## 2023-03-22 ENCOUNTER — Encounter (HOSPITAL_COMMUNITY): Payer: Self-pay | Admitting: Anesthesiology

## 2023-03-22 ENCOUNTER — Inpatient Hospital Stay (HOSPITAL_COMMUNITY): Payer: Medicare HMO

## 2023-03-22 ENCOUNTER — Other Ambulatory Visit (HOSPITAL_COMMUNITY): Payer: Medicare HMO

## 2023-03-22 ENCOUNTER — Observation Stay (HOSPITAL_COMMUNITY): Payer: Medicare HMO

## 2023-03-22 ENCOUNTER — Encounter (HOSPITAL_COMMUNITY): Payer: Self-pay | Admitting: Family Medicine

## 2023-03-22 ENCOUNTER — Other Ambulatory Visit: Payer: Self-pay

## 2023-03-22 DIAGNOSIS — Z8719 Personal history of other diseases of the digestive system: Secondary | ICD-10-CM | POA: Diagnosis not present

## 2023-03-22 DIAGNOSIS — I82441 Acute embolism and thrombosis of right tibial vein: Secondary | ICD-10-CM | POA: Diagnosis present

## 2023-03-22 DIAGNOSIS — I130 Hypertensive heart and chronic kidney disease with heart failure and stage 1 through stage 4 chronic kidney disease, or unspecified chronic kidney disease: Secondary | ICD-10-CM | POA: Diagnosis present

## 2023-03-22 DIAGNOSIS — I639 Cerebral infarction, unspecified: Secondary | ICD-10-CM | POA: Diagnosis not present

## 2023-03-22 DIAGNOSIS — E038 Other specified hypothyroidism: Secondary | ICD-10-CM | POA: Diagnosis not present

## 2023-03-22 DIAGNOSIS — G9349 Other encephalopathy: Secondary | ICD-10-CM | POA: Diagnosis present

## 2023-03-22 DIAGNOSIS — J449 Chronic obstructive pulmonary disease, unspecified: Secondary | ICD-10-CM

## 2023-03-22 DIAGNOSIS — G8311 Monoplegia of lower limb affecting right dominant side: Secondary | ICD-10-CM | POA: Diagnosis present

## 2023-03-22 DIAGNOSIS — E039 Hypothyroidism, unspecified: Secondary | ICD-10-CM | POA: Diagnosis present

## 2023-03-22 DIAGNOSIS — I6389 Other cerebral infarction: Secondary | ICD-10-CM | POA: Diagnosis not present

## 2023-03-22 DIAGNOSIS — I63412 Cerebral infarction due to embolism of left middle cerebral artery: Secondary | ICD-10-CM | POA: Diagnosis not present

## 2023-03-22 DIAGNOSIS — E1140 Type 2 diabetes mellitus with diabetic neuropathy, unspecified: Secondary | ICD-10-CM | POA: Diagnosis not present

## 2023-03-22 DIAGNOSIS — Z7189 Other specified counseling: Secondary | ICD-10-CM | POA: Diagnosis not present

## 2023-03-22 DIAGNOSIS — E7800 Pure hypercholesterolemia, unspecified: Secondary | ICD-10-CM | POA: Diagnosis not present

## 2023-03-22 DIAGNOSIS — I10 Essential (primary) hypertension: Secondary | ICD-10-CM

## 2023-03-22 DIAGNOSIS — J90 Pleural effusion, not elsewhere classified: Secondary | ICD-10-CM | POA: Diagnosis not present

## 2023-03-22 DIAGNOSIS — N179 Acute kidney failure, unspecified: Secondary | ICD-10-CM | POA: Diagnosis not present

## 2023-03-22 DIAGNOSIS — F99 Mental disorder, not otherwise specified: Secondary | ICD-10-CM

## 2023-03-22 DIAGNOSIS — I63512 Cerebral infarction due to unspecified occlusion or stenosis of left middle cerebral artery: Secondary | ICD-10-CM | POA: Diagnosis present

## 2023-03-22 DIAGNOSIS — M545 Low back pain, unspecified: Secondary | ICD-10-CM | POA: Diagnosis not present

## 2023-03-22 DIAGNOSIS — K219 Gastro-esophageal reflux disease without esophagitis: Secondary | ICD-10-CM

## 2023-03-22 DIAGNOSIS — F1120 Opioid dependence, uncomplicated: Secondary | ICD-10-CM | POA: Diagnosis present

## 2023-03-22 DIAGNOSIS — I2489 Other forms of acute ischemic heart disease: Secondary | ICD-10-CM | POA: Diagnosis present

## 2023-03-22 DIAGNOSIS — E669 Obesity, unspecified: Secondary | ICD-10-CM | POA: Diagnosis present

## 2023-03-22 DIAGNOSIS — J4489 Other specified chronic obstructive pulmonary disease: Secondary | ICD-10-CM | POA: Diagnosis present

## 2023-03-22 DIAGNOSIS — I6523 Occlusion and stenosis of bilateral carotid arteries: Secondary | ICD-10-CM | POA: Diagnosis not present

## 2023-03-22 DIAGNOSIS — D631 Anemia in chronic kidney disease: Secondary | ICD-10-CM | POA: Diagnosis present

## 2023-03-22 DIAGNOSIS — I4891 Unspecified atrial fibrillation: Secondary | ICD-10-CM | POA: Diagnosis present

## 2023-03-22 DIAGNOSIS — F319 Bipolar disorder, unspecified: Secondary | ICD-10-CM | POA: Diagnosis present

## 2023-03-22 DIAGNOSIS — E1122 Type 2 diabetes mellitus with diabetic chronic kidney disease: Secondary | ICD-10-CM | POA: Diagnosis present

## 2023-03-22 DIAGNOSIS — K769 Liver disease, unspecified: Secondary | ICD-10-CM | POA: Diagnosis not present

## 2023-03-22 DIAGNOSIS — R918 Other nonspecific abnormal finding of lung field: Secondary | ICD-10-CM | POA: Diagnosis not present

## 2023-03-22 DIAGNOSIS — I5189 Other ill-defined heart diseases: Secondary | ICD-10-CM | POA: Diagnosis not present

## 2023-03-22 DIAGNOSIS — Z515 Encounter for palliative care: Secondary | ICD-10-CM | POA: Diagnosis not present

## 2023-03-22 DIAGNOSIS — I82451 Acute embolism and thrombosis of right peroneal vein: Secondary | ICD-10-CM | POA: Diagnosis present

## 2023-03-22 DIAGNOSIS — Z86711 Personal history of pulmonary embolism: Secondary | ICD-10-CM | POA: Diagnosis not present

## 2023-03-22 DIAGNOSIS — K559 Vascular disorder of intestine, unspecified: Secondary | ICD-10-CM | POA: Diagnosis present

## 2023-03-22 DIAGNOSIS — I5021 Acute systolic (congestive) heart failure: Secondary | ICD-10-CM | POA: Diagnosis present

## 2023-03-22 DIAGNOSIS — G2581 Restless legs syndrome: Secondary | ICD-10-CM | POA: Diagnosis present

## 2023-03-22 DIAGNOSIS — I2602 Saddle embolus of pulmonary artery with acute cor pulmonale: Secondary | ICD-10-CM | POA: Diagnosis not present

## 2023-03-22 DIAGNOSIS — I513 Intracardiac thrombosis, not elsewhere classified: Secondary | ICD-10-CM | POA: Diagnosis present

## 2023-03-22 DIAGNOSIS — I82411 Acute embolism and thrombosis of right femoral vein: Secondary | ICD-10-CM | POA: Diagnosis present

## 2023-03-22 DIAGNOSIS — I2609 Other pulmonary embolism with acute cor pulmonale: Secondary | ICD-10-CM | POA: Diagnosis present

## 2023-03-22 DIAGNOSIS — I700 Atherosclerosis of aorta: Secondary | ICD-10-CM | POA: Diagnosis present

## 2023-03-22 DIAGNOSIS — I471 Supraventricular tachycardia, unspecified: Secondary | ICD-10-CM | POA: Diagnosis not present

## 2023-03-22 DIAGNOSIS — N1832 Chronic kidney disease, stage 3b: Secondary | ICD-10-CM | POA: Diagnosis present

## 2023-03-22 DIAGNOSIS — R4701 Aphasia: Secondary | ICD-10-CM | POA: Diagnosis present

## 2023-03-22 DIAGNOSIS — I2692 Saddle embolus of pulmonary artery without acute cor pulmonale: Secondary | ICD-10-CM | POA: Diagnosis not present

## 2023-03-22 LAB — ECHOCARDIOGRAM COMPLETE
AR max vel: 2.49 cm2
AV Peak grad: 4.2 mm[Hg]
Ao pk vel: 1.03 m/s
Area-P 1/2: 4.15 cm2
MV M vel: 2.08 m/s
MV Peak grad: 17.3 mm[Hg]
S' Lateral: 3.1 cm

## 2023-03-22 LAB — CBC WITH DIFFERENTIAL/PLATELET
Abs Immature Granulocytes: 0.03 10*3/uL (ref 0.00–0.07)
Basophils Absolute: 0.1 10*3/uL (ref 0.0–0.1)
Basophils Relative: 1 %
Eosinophils Absolute: 0 10*3/uL (ref 0.0–0.5)
Eosinophils Relative: 0 %
HCT: 30.1 % — ABNORMAL LOW (ref 36.0–46.0)
Hemoglobin: 9.3 g/dL — ABNORMAL LOW (ref 12.0–15.0)
Immature Granulocytes: 0 %
Lymphocytes Relative: 10 %
Lymphs Abs: 1 10*3/uL (ref 0.7–4.0)
MCH: 26.3 pg (ref 26.0–34.0)
MCHC: 30.9 g/dL (ref 30.0–36.0)
MCV: 85.3 fL (ref 80.0–100.0)
Monocytes Absolute: 0.5 10*3/uL (ref 0.1–1.0)
Monocytes Relative: 6 %
Neutro Abs: 7.7 10*3/uL (ref 1.7–7.7)
Neutrophils Relative %: 83 %
Platelets: 312 10*3/uL (ref 150–400)
RBC: 3.53 MIL/uL — ABNORMAL LOW (ref 3.87–5.11)
RDW: 16.6 % — ABNORMAL HIGH (ref 11.5–15.5)
WBC: 9.3 10*3/uL (ref 4.0–10.5)
nRBC: 0 % (ref 0.0–0.2)

## 2023-03-22 LAB — LIPID PANEL
Cholesterol: 227 mg/dL — ABNORMAL HIGH (ref 0–200)
Cholesterol: 204 mg/dL — ABNORMAL HIGH (ref 0–200)
HDL: 71 mg/dL (ref >40)
HDL: 67 mg/dL (ref >40)
LDL Cholesterol: 139 mg/dL — ABNORMAL HIGH (ref 0–99)
LDL Cholesterol: 121 mg/dL — ABNORMAL HIGH (ref 0–99)
Total CHOL/HDL Ratio: 3.2 {ratio}
Total CHOL/HDL Ratio: 3 {ratio}
Triglycerides: 83 mg/dL (ref <150)
Triglycerides: 79 mg/dL (ref <150)
VLDL: 17 mg/dL (ref 0–40)
VLDL: 16 mg/dL (ref 0–40)

## 2023-03-22 LAB — HEMOGLOBIN A1C
Hgb A1c MFr Bld: 5.3 % (ref 4.8–5.6)
Hgb A1c MFr Bld: 5.3 % (ref 4.8–5.6)
Mean Plasma Glucose: 105.41 mg/dL
Mean Plasma Glucose: 105.41 mg/dL

## 2023-03-22 LAB — MAGNESIUM: Magnesium: 1.9 mg/dL (ref 1.7–2.4)

## 2023-03-22 LAB — TROPONIN I (HIGH SENSITIVITY)
Troponin I (High Sensitivity): 1100 ng/L (ref <18)
Troponin I (High Sensitivity): 1027 ng/L (ref <18)

## 2023-03-22 LAB — CBG MONITORING, ED
Glucose-Capillary: 101 mg/dL — ABNORMAL HIGH (ref 70–99)
Glucose-Capillary: 96 mg/dL (ref 70–99)

## 2023-03-22 LAB — COMPREHENSIVE METABOLIC PANEL
ALT: 10 U/L (ref 0–44)
AST: 23 U/L (ref 15–41)
Albumin: 2.7 g/dL — ABNORMAL LOW (ref 3.5–5.0)
Alkaline Phosphatase: 137 U/L — ABNORMAL HIGH (ref 38–126)
Anion gap: 11 (ref 5–15)
BUN: 17 mg/dL (ref 8–23)
CO2: 22 mmol/L (ref 22–32)
Calcium: 8.3 mg/dL — ABNORMAL LOW (ref 8.9–10.3)
Chloride: 106 mmol/L (ref 98–111)
Creatinine, Ser: 1.56 mg/dL — ABNORMAL HIGH (ref 0.44–1.00)
GFR, Estimated: 37 mL/min — ABNORMAL LOW (ref >60)
Glucose, Bld: 117 mg/dL — ABNORMAL HIGH (ref 70–99)
Potassium: 3 mmol/L — ABNORMAL LOW (ref 3.5–5.1)
Sodium: 139 mmol/L (ref 135–145)
Total Bilirubin: 0.8 mg/dL (ref 0.3–1.2)
Total Protein: 6.6 g/dL (ref 6.5–8.1)

## 2023-03-22 LAB — GLUCOSE, CAPILLARY
Glucose-Capillary: 108 mg/dL — ABNORMAL HIGH (ref 70–99)
Glucose-Capillary: 83 mg/dL (ref 70–99)

## 2023-03-22 LAB — BRAIN NATRIURETIC PEPTIDE: B Natriuretic Peptide: 1241.6 pg/mL — ABNORMAL HIGH (ref 0.0–100.0)

## 2023-03-22 LAB — TSH: TSH: 19.119 u[IU]/mL — ABNORMAL HIGH (ref 0.350–4.500)

## 2023-03-22 MED ORDER — ROSUVASTATIN CALCIUM 20 MG PO TABS
40.0000 mg | ORAL_TABLET | Freq: Every day | ORAL | Status: DC
  Administered 2023-03-22 – 2023-03-29 (×8): 40 mg via ORAL
  Filled 2023-03-22 (×8): qty 2

## 2023-03-22 MED ORDER — ACETAMINOPHEN 650 MG RE SUPP: 650.0000 mg | Freq: Four times a day (QID) | RECTAL | Status: DC | PRN

## 2023-03-22 MED ORDER — ONDANSETRON HCL 4 MG PO TABS: 4.0000 mg | ORAL_TABLET | Freq: Four times a day (QID) | ORAL | Status: DC | PRN

## 2023-03-22 MED ORDER — POTASSIUM CHLORIDE 10 MEQ/100ML IV SOLN
10.0000 meq | INTRAVENOUS | Status: AC
  Administered 2023-03-22 (×4): 10 meq via INTRAVENOUS
  Filled 2023-03-22 (×3): qty 100

## 2023-03-22 MED ORDER — SODIUM CHLORIDE 0.9 % IV SOLN: INTRAVENOUS | Status: DC

## 2023-03-22 MED ORDER — IOHEXOL 350 MG/ML SOLN
75.0000 mL | Freq: Once | INTRAVENOUS | Status: AC | PRN
  Administered 2023-03-22: 75 mL via INTRAVENOUS

## 2023-03-22 MED ORDER — PERFLUTREN LIPID MICROSPHERE
1.0000 mL | INTRAVENOUS | Status: AC | PRN
  Administered 2023-03-22: 6 mL via INTRAVENOUS

## 2023-03-22 MED ORDER — VORTIOXETINE HBR 20 MG PO TABS
20.0000 mg | ORAL_TABLET | Freq: Every day | ORAL | Status: DC
  Administered 2023-03-22 – 2023-03-29 (×8): 20 mg via ORAL
  Filled 2023-03-22 (×8): qty 1

## 2023-03-22 MED ORDER — SPIRONOLACTONE 12.5 MG HALF TABLET: 12.5000 mg | ORAL_TABLET | Freq: Every day | ORAL | Status: DC

## 2023-03-22 MED ORDER — FESOTERODINE FUMARATE ER 4 MG PO TB24
4.0000 mg | ORAL_TABLET | Freq: Every day | ORAL | Status: DC
  Administered 2023-03-22 – 2023-03-29 (×8): 4 mg via ORAL
  Filled 2023-03-22 (×8): qty 1

## 2023-03-22 MED ORDER — ROPINIROLE HCL 1 MG PO TABS
2.0000 mg | ORAL_TABLET | Freq: Every day | ORAL | Status: DC
  Administered 2023-03-22 – 2023-03-28 (×7): 2 mg via ORAL
  Filled 2023-03-22 (×7): qty 2

## 2023-03-22 MED ORDER — CLOPIDOGREL BISULFATE 300 MG PO TABS: 300.0000 mg | ORAL_TABLET | Freq: Once | ORAL | Status: DC

## 2023-03-22 MED ORDER — ISOSORBIDE MONONITRATE ER 30 MG PO TB24: 30.0000 mg | ORAL_TABLET | Freq: Every day | ORAL | Status: DC

## 2023-03-22 MED ORDER — MORPHINE SULFATE ER 15 MG PO TBCR
15.0000 mg | EXTENDED_RELEASE_TABLET | Freq: Two times a day (BID) | ORAL | Status: DC
  Administered 2023-03-22 – 2023-03-29 (×15): 15 mg via ORAL
  Filled 2023-03-22 (×15): qty 1

## 2023-03-22 MED ORDER — LEVOTHYROXINE SODIUM 75 MCG PO TABS
150.0000 ug | ORAL_TABLET | Freq: Every day | ORAL | Status: DC
  Administered 2023-03-22 – 2023-03-29 (×7): 150 ug via ORAL
  Filled 2023-03-22 (×7): qty 2

## 2023-03-22 MED ORDER — CILOSTAZOL 50 MG PO TABS
50.0000 mg | ORAL_TABLET | Freq: Two times a day (BID) | ORAL | Status: DC
  Administered 2023-03-22 – 2023-03-29 (×14): 50 mg via ORAL
  Filled 2023-03-22 (×15): qty 1

## 2023-03-22 MED ORDER — ACETAMINOPHEN 325 MG PO TABS
650.0000 mg | ORAL_TABLET | Freq: Four times a day (QID) | ORAL | Status: DC | PRN
  Administered 2023-03-23 – 2023-03-27 (×3): 650 mg via ORAL
  Filled 2023-03-22 (×5): qty 2

## 2023-03-22 MED ORDER — CLOPIDOGREL BISULFATE 75 MG PO TABS
75.0000 mg | ORAL_TABLET | Freq: Every day | ORAL | Status: DC
  Administered 2023-03-22 – 2023-03-24 (×3): 75 mg via ORAL
  Filled 2023-03-22 (×3): qty 1

## 2023-03-22 MED ORDER — CILOSTAZOL 50 MG PO TABS: 50.0000 mg | ORAL_TABLET | Freq: Two times a day (BID) | ORAL | Status: DC

## 2023-03-22 MED ORDER — ALPRAZOLAM 0.5 MG PO TABS: 1.0000 mg | ORAL_TABLET | Freq: Three times a day (TID) | ORAL | Status: DC | PRN

## 2023-03-22 MED ORDER — ONDANSETRON HCL 4 MG/2ML IJ SOLN: 4.0000 mg | Freq: Four times a day (QID) | INTRAMUSCULAR | Status: DC | PRN

## 2023-03-22 MED ORDER — HEPARIN SODIUM (PORCINE) 5000 UNIT/ML IJ SOLN
5000.0000 [IU] | Freq: Three times a day (TID) | INTRAMUSCULAR | Status: DC
  Administered 2023-03-22 (×2): 5000 [IU] via SUBCUTANEOUS
  Filled 2023-03-22 (×2): qty 1

## 2023-03-22 MED ORDER — HEPARIN (PORCINE) 25000 UT/250ML-% IV SOLN
1100.0000 [IU]/h | INTRAVENOUS | Status: DC
  Administered 2023-03-22: 1000 [IU]/h via INTRAVENOUS
  Administered 2023-03-23: 1150 [IU]/h via INTRAVENOUS
  Administered 2023-03-25: 1100 [IU]/h via INTRAVENOUS
  Filled 2023-03-22 (×5): qty 250

## 2023-03-22 MED ORDER — UMECLIDINIUM-VILANTEROL 62.5-25 MCG/ACT IN AEPB
1.0000 | INHALATION_SPRAY | Freq: Every day | RESPIRATORY_TRACT | Status: DC
  Administered 2023-03-22 – 2023-03-29 (×7): 1 via RESPIRATORY_TRACT
  Filled 2023-03-22 (×2): qty 14

## 2023-03-22 MED ORDER — PANTOPRAZOLE SODIUM 40 MG PO TBEC
40.0000 mg | DELAYED_RELEASE_TABLET | Freq: Every day | ORAL | Status: DC
  Administered 2023-03-22 – 2023-03-29 (×8): 40 mg via ORAL
  Filled 2023-03-22 (×8): qty 1

## 2023-03-22 MED ORDER — DAPAGLIFLOZIN PROPANEDIOL 5 MG PO TABS
5.0000 mg | ORAL_TABLET | Freq: Every day | ORAL | Status: DC
  Administered 2023-03-22 – 2023-03-29 (×8): 5 mg via ORAL
  Filled 2023-03-22 (×8): qty 1

## 2023-03-22 MED ORDER — OXYCODONE HCL 5 MG PO TABS
5.0000 mg | ORAL_TABLET | ORAL | Status: DC | PRN
  Administered 2023-03-25 – 2023-03-29 (×12): 5 mg via ORAL
  Filled 2023-03-22 (×12): qty 1

## 2023-03-22 MED ORDER — BREXPIPRAZOLE 2 MG PO TABS
2.0000 mg | ORAL_TABLET | Freq: Every day | ORAL | Status: DC
  Administered 2023-03-22 – 2023-03-29 (×8): 2 mg via ORAL
  Filled 2023-03-22 (×8): qty 1

## 2023-03-22 MED ORDER — DOXEPIN HCL 25 MG PO CAPS
75.0000 mg | ORAL_CAPSULE | Freq: Every day | ORAL | Status: DC
  Administered 2023-03-22 – 2023-03-28 (×7): 75 mg via ORAL
  Filled 2023-03-22 (×5): qty 3
  Filled 2023-03-22: qty 1
  Filled 2023-03-22 (×3): qty 3

## 2023-03-22 MED ORDER — ALBUTEROL SULFATE (2.5 MG/3ML) 0.083% IN NEBU: 3.0000 mL | INHALATION_SOLUTION | Freq: Four times a day (QID) | RESPIRATORY_TRACT | Status: DC | PRN

## 2023-03-22 NOTE — ED Notes (Signed)
PT has slurred speech and speech is all over the place

## 2023-03-22 NOTE — Assessment & Plan Note (Signed)
-   With history of melena/GI hemorrhage - Continue Protonix - Closely monitor hemoglobin, at the end of August it was as low as 3.0 - Continue to monitor

## 2023-03-22 NOTE — Consult Note (Signed)
Cardiology Consultation   Patient ID: Caroline Williams MRN: [id number]; DOB: December 12, 1960  Admit date: 03/21/2023 Date of Consult: 03/22/2023  PCP:  Caroline Register, MD   [facility] Providers Cardiologist:  Reatha Harps, MD  Cardiology APP:  Beatrice Lecher, PA-C  {  Patient Profile:   Caroline Williams is a 62 y.o. female with a hx of nonobstructive CAD, hypertension, hyperlipidemia on Praluent, DM2, COPD, tobacco abuse, PVCs, and family history of vascular disease, recent GI hemorrhage who is being seen 03/22/2023 for the evaluation of left atrial mass at the request of Dr. Tyson Williams.  History of Present Illness:   [name] established with cardiology in 2017 for evaluation of chest discomfort in the setting of morbid obesity, hypertension, hyperlipidemia, OSA, tobacco abuse, and family history of vascular disease.  She underwent nuclear stress test that was nonischemic.  She subsequently underwent CT coronary 2021 that showed coronary calcium score 49 placing her at the 8th percentile.  She was seen back in clinic June 2020 for reporting exertional chest pain and was recommended for repeat nuclear stress test.  She also reported palpitations and was recommended for heart monitor.  Heart monitor showed frequent PVCs with a burden of 13.1%.  Nuclear stress test was again nonischemic. Echo 12/2022 showed LVEF 50-55% with normal RV size and function, normal PASP, mild to moderate MR.   She presented to [facility] 03/21/23 with difficulty speaking and slurred speech when she woke up. She presented with expressive aphasia. CODE STROKE initiated, imaging consistent with acut ischemic infarct with bilateral L MCA distribution infarcts (frontal, parietal, temporal) R frontal lobe, chrnoic right frontal infarcts with etiology felt consistent with cardioembolic source. Echocardiogram recommended today and demonstrated left atrial mass with PFO and mass extending into the right atrium.   Pt  continues to speak in one word answers with difficulty following commands.  She tells me she is not in pain.  Very limited response to my questions.  Reviewed her echocardiogram.  I am quite concerned for right atrial thrombus as well as thrombus extending across the PFO into the left atrium.  Her echo also was concerning for right heart strain.  I do wonder if she has a submassive PE.     Past Medical History:  Diagnosis Date   Anxiety    Arthritis    Asthma    Bipolar 1 disorder (HCC)    Breast discharge    Breast lump    Breast pain    CAD (coronary artery disease) 12/03/2022    o Cath 2008: no CAD   o CCTA 10/05/19: CAC score 49, 88th percentile, nonobstructive CAD (LM 1-24 mid and dist)  o Myoview 12/31/20: EF 45, no ischemia or infarction, low risk    Chronic pain    on MS Contin and oxycodone   COPD (chronic obstructive pulmonary disease) (HCC)    Depression    Diabetes mellitus    diet and exercise controlled   Dysrhythmia    HR in 30s on metoprolol   Escherichia coli (E. coli) infection    Fever    GERD (gastroesophageal reflux disease)    History of chest pain    History of knee replacement, total    Hypertension    Hypothyroidism    N&V (nausea and vomiting)    Peripheral neuropathy    Right foot no sensation   Sciatica    Sleep apnea    does not use CPAP   Thyroid disease  03/22/2023    ECHOCARDIOGRAM REPORT   Patient Name:   Caroline Williams Date of Exam: 03/22/2023 Medical Rec #:  [id number]       Height:       65.0 in Accession #:    [id number]      Weight:       215.0 lb Date of Birth:  20-Oct-1960       BSA:          2.040 m Patient Age:    62 years        BP:            142/88 mmHg Patient Gender: F               HR:           110 bpm. Exam Location:  Inpatient Procedure: 2D Echo, Cardiac Doppler, Color Doppler and Intracardiac            Opacification Agent Indications:    Stroke I63.9  History:        Patient has prior history of Echocardiogram examinations, most                 recent 12/28/2022. CAD, Stroke and COPD, Arrythmias:PVC,                 Signs/Symptoms:Chest Pain; Risk Factors:Hypertension, Diabetes,                 Dyslipidemia, Current Smoker and Sleep Apnea. CKD, stage 3.  Sonographer:    [name] RCS Referring Phys: [id number] ASIA B ZIERLE-GHOSH  Sonographer Comments: Image acquisition challenging due to uncooperative patient. IMPRESSIONS  1. There is a mobile clot/thrombus in transit sitting in the interatrial septum.  2. Left ventricular ejection fraction, by estimation, is 40 to 45%. The left ventricle has mildly decreased function. The left ventricle demonstrates global hypokinesis. Left ventricular diastolic parameters are indeterminate.  3. Right ventricular systolic function is normal. The right ventricular size is normal.  4. Thrombus noted in the left atrium.  5. The mitral valve is normal in structure. No evidence of mitral valve regurgitation. No evidence of mitral stenosis.  6. The aortic valve is normal in structure. Aortic valve regurgitation is not visualized. No aortic stenosis is present.  7. There is borderline dilatation of the ascending aorta, measuring 36 mm.  8. The inferior vena cava is normal in size with greater than 50% respiratory variability, suggesting right atrial pressure of 3 mmHg.  9. Thrombus seen in the right atrium. FINDINGS  Left Ventricle: Left ventricular ejection fraction, by estimation, is 40 to 45%. The left ventricle has mildly decreased function. The left ventricle demonstrates global hypokinesis. Definity contrast agent was given IV to delineate the left ventricular  endocardial borders. The left ventricular  internal cavity size was normal in size. There is no left ventricular hypertrophy. Left ventricular diastolic parameters are indeterminate. Right Ventricle: The right ventricular size is normal. No increase in right ventricular wall thickness. Right ventricular systolic function is normal. Left Atrium: Thrombus noted in the left atrium. Left atrial size was normal in size. Right Atrium: Thrombus seen in the right atrium. Right atrial size was normal in size. Pericardium: There is no evidence of pericardial effusion. Mitral Valve: The mitral valve is normal in structure. No evidence of mitral valve regurgitation. No evidence of mitral valve stenosis. Tricuspid Valve: The tricuspid valve is normal in structure. Tricuspid valve regurgitation is not demonstrated. No evidence of tricuspid stenosis. Aortic Valve:  Cardiology Consultation   Patient ID: Caroline Williams MRN: [id number]; DOB: December 12, 1960  Admit date: 03/21/2023 Date of Consult: 03/22/2023  PCP:  Caroline Register, MD   [facility] Providers Cardiologist:  Reatha Harps, MD  Cardiology APP:  Beatrice Lecher, PA-C  {  Patient Profile:   Caroline Williams is a 62 y.o. female with a hx of nonobstructive CAD, hypertension, hyperlipidemia on Praluent, DM2, COPD, tobacco abuse, PVCs, and family history of vascular disease, recent GI hemorrhage who is being seen 03/22/2023 for the evaluation of left atrial mass at the request of Dr. Tyson Williams.  History of Present Illness:   [name] established with cardiology in 2017 for evaluation of chest discomfort in the setting of morbid obesity, hypertension, hyperlipidemia, OSA, tobacco abuse, and family history of vascular disease.  She underwent nuclear stress test that was nonischemic.  She subsequently underwent CT coronary 2021 that showed coronary calcium score 49 placing her at the 8th percentile.  She was seen back in clinic June 2020 for reporting exertional chest pain and was recommended for repeat nuclear stress test.  She also reported palpitations and was recommended for heart monitor.  Heart monitor showed frequent PVCs with a burden of 13.1%.  Nuclear stress test was again nonischemic. Echo 12/2022 showed LVEF 50-55% with normal RV size and function, normal PASP, mild to moderate MR.   She presented to [facility] 03/21/23 with difficulty speaking and slurred speech when she woke up. She presented with expressive aphasia. CODE STROKE initiated, imaging consistent with acut ischemic infarct with bilateral L MCA distribution infarcts (frontal, parietal, temporal) R frontal lobe, chrnoic right frontal infarcts with etiology felt consistent with cardioembolic source. Echocardiogram recommended today and demonstrated left atrial mass with PFO and mass extending into the right atrium.   Pt  continues to speak in one word answers with difficulty following commands.  She tells me she is not in pain.  Very limited response to my questions.  Reviewed her echocardiogram.  I am quite concerned for right atrial thrombus as well as thrombus extending across the PFO into the left atrium.  Her echo also was concerning for right heart strain.  I do wonder if she has a submassive PE.     Past Medical History:  Diagnosis Date   Anxiety    Arthritis    Asthma    Bipolar 1 disorder (HCC)    Breast discharge    Breast lump    Breast pain    CAD (coronary artery disease) 12/03/2022    o Cath 2008: no CAD   o CCTA 10/05/19: CAC score 49, 88th percentile, nonobstructive CAD (LM 1-24 mid and dist)  o Myoview 12/31/20: EF 45, no ischemia or infarction, low risk    Chronic pain    on MS Contin and oxycodone   COPD (chronic obstructive pulmonary disease) (HCC)    Depression    Diabetes mellitus    diet and exercise controlled   Dysrhythmia    HR in 30s on metoprolol   Escherichia coli (E. coli) infection    Fever    GERD (gastroesophageal reflux disease)    History of chest pain    History of knee replacement, total    Hypertension    Hypothyroidism    N&V (nausea and vomiting)    Peripheral neuropathy    Right foot no sensation   Sciatica    Sleep apnea    does not use CPAP   Thyroid disease  Cardiology Consultation   Patient ID: Caroline Williams MRN: [id number]; DOB: December 12, 1960  Admit date: 03/21/2023 Date of Consult: 03/22/2023  PCP:  Caroline Register, MD   [facility] Providers Cardiologist:  Reatha Harps, MD  Cardiology APP:  Beatrice Lecher, PA-C  {  Patient Profile:   Caroline Williams is a 62 y.o. female with a hx of nonobstructive CAD, hypertension, hyperlipidemia on Praluent, DM2, COPD, tobacco abuse, PVCs, and family history of vascular disease, recent GI hemorrhage who is being seen 03/22/2023 for the evaluation of left atrial mass at the request of Dr. Tyson Williams.  History of Present Illness:   [name] established with cardiology in 2017 for evaluation of chest discomfort in the setting of morbid obesity, hypertension, hyperlipidemia, OSA, tobacco abuse, and family history of vascular disease.  She underwent nuclear stress test that was nonischemic.  She subsequently underwent CT coronary 2021 that showed coronary calcium score 49 placing her at the 8th percentile.  She was seen back in clinic June 2020 for reporting exertional chest pain and was recommended for repeat nuclear stress test.  She also reported palpitations and was recommended for heart monitor.  Heart monitor showed frequent PVCs with a burden of 13.1%.  Nuclear stress test was again nonischemic. Echo 12/2022 showed LVEF 50-55% with normal RV size and function, normal PASP, mild to moderate MR.   She presented to [facility] 03/21/23 with difficulty speaking and slurred speech when she woke up. She presented with expressive aphasia. CODE STROKE initiated, imaging consistent with acut ischemic infarct with bilateral L MCA distribution infarcts (frontal, parietal, temporal) R frontal lobe, chrnoic right frontal infarcts with etiology felt consistent with cardioembolic source. Echocardiogram recommended today and demonstrated left atrial mass with PFO and mass extending into the right atrium.   Pt  continues to speak in one word answers with difficulty following commands.  She tells me she is not in pain.  Very limited response to my questions.  Reviewed her echocardiogram.  I am quite concerned for right atrial thrombus as well as thrombus extending across the PFO into the left atrium.  Her echo also was concerning for right heart strain.  I do wonder if she has a submassive PE.     Past Medical History:  Diagnosis Date   Anxiety    Arthritis    Asthma    Bipolar 1 disorder (HCC)    Breast discharge    Breast lump    Breast pain    CAD (coronary artery disease) 12/03/2022    o Cath 2008: no CAD   o CCTA 10/05/19: CAC score 49, 88th percentile, nonobstructive CAD (LM 1-24 mid and dist)  o Myoview 12/31/20: EF 45, no ischemia or infarction, low risk    Chronic pain    on MS Contin and oxycodone   COPD (chronic obstructive pulmonary disease) (HCC)    Depression    Diabetes mellitus    diet and exercise controlled   Dysrhythmia    HR in 30s on metoprolol   Escherichia coli (E. coli) infection    Fever    GERD (gastroesophageal reflux disease)    History of chest pain    History of knee replacement, total    Hypertension    Hypothyroidism    N&V (nausea and vomiting)    Peripheral neuropathy    Right foot no sensation   Sciatica    Sleep apnea    does not use CPAP   Thyroid disease  03/22/2023    ECHOCARDIOGRAM REPORT   Patient Name:   Caroline Williams Date of Exam: 03/22/2023 Medical Rec #:  [id number]       Height:       65.0 in Accession #:    [id number]      Weight:       215.0 lb Date of Birth:  20-Oct-1960       BSA:          2.040 m Patient Age:    62 years        BP:            142/88 mmHg Patient Gender: F               HR:           110 bpm. Exam Location:  Inpatient Procedure: 2D Echo, Cardiac Doppler, Color Doppler and Intracardiac            Opacification Agent Indications:    Stroke I63.9  History:        Patient has prior history of Echocardiogram examinations, most                 recent 12/28/2022. CAD, Stroke and COPD, Arrythmias:PVC,                 Signs/Symptoms:Chest Pain; Risk Factors:Hypertension, Diabetes,                 Dyslipidemia, Current Smoker and Sleep Apnea. CKD, stage 3.  Sonographer:    [name] RCS Referring Phys: [id number] ASIA B ZIERLE-GHOSH  Sonographer Comments: Image acquisition challenging due to uncooperative patient. IMPRESSIONS  1. There is a mobile clot/thrombus in transit sitting in the interatrial septum.  2. Left ventricular ejection fraction, by estimation, is 40 to 45%. The left ventricle has mildly decreased function. The left ventricle demonstrates global hypokinesis. Left ventricular diastolic parameters are indeterminate.  3. Right ventricular systolic function is normal. The right ventricular size is normal.  4. Thrombus noted in the left atrium.  5. The mitral valve is normal in structure. No evidence of mitral valve regurgitation. No evidence of mitral stenosis.  6. The aortic valve is normal in structure. Aortic valve regurgitation is not visualized. No aortic stenosis is present.  7. There is borderline dilatation of the ascending aorta, measuring 36 mm.  8. The inferior vena cava is normal in size with greater than 50% respiratory variability, suggesting right atrial pressure of 3 mmHg.  9. Thrombus seen in the right atrium. FINDINGS  Left Ventricle: Left ventricular ejection fraction, by estimation, is 40 to 45%. The left ventricle has mildly decreased function. The left ventricle demonstrates global hypokinesis. Definity contrast agent was given IV to delineate the left ventricular  endocardial borders. The left ventricular  internal cavity size was normal in size. There is no left ventricular hypertrophy. Left ventricular diastolic parameters are indeterminate. Right Ventricle: The right ventricular size is normal. No increase in right ventricular wall thickness. Right ventricular systolic function is normal. Left Atrium: Thrombus noted in the left atrium. Left atrial size was normal in size. Right Atrium: Thrombus seen in the right atrium. Right atrial size was normal in size. Pericardium: There is no evidence of pericardial effusion. Mitral Valve: The mitral valve is normal in structure. No evidence of mitral valve regurgitation. No evidence of mitral valve stenosis. Tricuspid Valve: The tricuspid valve is normal in structure. Tricuspid valve regurgitation is not demonstrated. No evidence of tricuspid stenosis. Aortic Valve:  03/22/2023    ECHOCARDIOGRAM REPORT   Patient Name:   Caroline Williams Date of Exam: 03/22/2023 Medical Rec #:  [id number]       Height:       65.0 in Accession #:    [id number]      Weight:       215.0 lb Date of Birth:  20-Oct-1960       BSA:          2.040 m Patient Age:    62 years        BP:            142/88 mmHg Patient Gender: F               HR:           110 bpm. Exam Location:  Inpatient Procedure: 2D Echo, Cardiac Doppler, Color Doppler and Intracardiac            Opacification Agent Indications:    Stroke I63.9  History:        Patient has prior history of Echocardiogram examinations, most                 recent 12/28/2022. CAD, Stroke and COPD, Arrythmias:PVC,                 Signs/Symptoms:Chest Pain; Risk Factors:Hypertension, Diabetes,                 Dyslipidemia, Current Smoker and Sleep Apnea. CKD, stage 3.  Sonographer:    [name] RCS Referring Phys: [id number] ASIA B ZIERLE-GHOSH  Sonographer Comments: Image acquisition challenging due to uncooperative patient. IMPRESSIONS  1. There is a mobile clot/thrombus in transit sitting in the interatrial septum.  2. Left ventricular ejection fraction, by estimation, is 40 to 45%. The left ventricle has mildly decreased function. The left ventricle demonstrates global hypokinesis. Left ventricular diastolic parameters are indeterminate.  3. Right ventricular systolic function is normal. The right ventricular size is normal.  4. Thrombus noted in the left atrium.  5. The mitral valve is normal in structure. No evidence of mitral valve regurgitation. No evidence of mitral stenosis.  6. The aortic valve is normal in structure. Aortic valve regurgitation is not visualized. No aortic stenosis is present.  7. There is borderline dilatation of the ascending aorta, measuring 36 mm.  8. The inferior vena cava is normal in size with greater than 50% respiratory variability, suggesting right atrial pressure of 3 mmHg.  9. Thrombus seen in the right atrium. FINDINGS  Left Ventricle: Left ventricular ejection fraction, by estimation, is 40 to 45%. The left ventricle has mildly decreased function. The left ventricle demonstrates global hypokinesis. Definity contrast agent was given IV to delineate the left ventricular  endocardial borders. The left ventricular  internal cavity size was normal in size. There is no left ventricular hypertrophy. Left ventricular diastolic parameters are indeterminate. Right Ventricle: The right ventricular size is normal. No increase in right ventricular wall thickness. Right ventricular systolic function is normal. Left Atrium: Thrombus noted in the left atrium. Left atrial size was normal in size. Right Atrium: Thrombus seen in the right atrium. Right atrial size was normal in size. Pericardium: There is no evidence of pericardial effusion. Mitral Valve: The mitral valve is normal in structure. No evidence of mitral valve regurgitation. No evidence of mitral valve stenosis. Tricuspid Valve: The tricuspid valve is normal in structure. Tricuspid valve regurgitation is not demonstrated. No evidence of tricuspid stenosis. Aortic Valve:  Cardiology Consultation   Patient ID: Caroline Williams MRN: [id number]; DOB: December 12, 1960  Admit date: 03/21/2023 Date of Consult: 03/22/2023  PCP:  Caroline Register, MD   [facility] Providers Cardiologist:  Reatha Harps, MD  Cardiology APP:  Beatrice Lecher, PA-C  {  Patient Profile:   Caroline Williams is a 62 y.o. female with a hx of nonobstructive CAD, hypertension, hyperlipidemia on Praluent, DM2, COPD, tobacco abuse, PVCs, and family history of vascular disease, recent GI hemorrhage who is being seen 03/22/2023 for the evaluation of left atrial mass at the request of Dr. Tyson Williams.  History of Present Illness:   [name] established with cardiology in 2017 for evaluation of chest discomfort in the setting of morbid obesity, hypertension, hyperlipidemia, OSA, tobacco abuse, and family history of vascular disease.  She underwent nuclear stress test that was nonischemic.  She subsequently underwent CT coronary 2021 that showed coronary calcium score 49 placing her at the 8th percentile.  She was seen back in clinic June 2020 for reporting exertional chest pain and was recommended for repeat nuclear stress test.  She also reported palpitations and was recommended for heart monitor.  Heart monitor showed frequent PVCs with a burden of 13.1%.  Nuclear stress test was again nonischemic. Echo 12/2022 showed LVEF 50-55% with normal RV size and function, normal PASP, mild to moderate MR.   She presented to [facility] 03/21/23 with difficulty speaking and slurred speech when she woke up. She presented with expressive aphasia. CODE STROKE initiated, imaging consistent with acut ischemic infarct with bilateral L MCA distribution infarcts (frontal, parietal, temporal) R frontal lobe, chrnoic right frontal infarcts with etiology felt consistent with cardioembolic source. Echocardiogram recommended today and demonstrated left atrial mass with PFO and mass extending into the right atrium.   Pt  continues to speak in one word answers with difficulty following commands.  She tells me she is not in pain.  Very limited response to my questions.  Reviewed her echocardiogram.  I am quite concerned for right atrial thrombus as well as thrombus extending across the PFO into the left atrium.  Her echo also was concerning for right heart strain.  I do wonder if she has a submassive PE.     Past Medical History:  Diagnosis Date   Anxiety    Arthritis    Asthma    Bipolar 1 disorder (HCC)    Breast discharge    Breast lump    Breast pain    CAD (coronary artery disease) 12/03/2022    o Cath 2008: no CAD   o CCTA 10/05/19: CAC score 49, 88th percentile, nonobstructive CAD (LM 1-24 mid and dist)  o Myoview 12/31/20: EF 45, no ischemia or infarction, low risk    Chronic pain    on MS Contin and oxycodone   COPD (chronic obstructive pulmonary disease) (HCC)    Depression    Diabetes mellitus    diet and exercise controlled   Dysrhythmia    HR in 30s on metoprolol   Escherichia coli (E. coli) infection    Fever    GERD (gastroesophageal reflux disease)    History of chest pain    History of knee replacement, total    Hypertension    Hypothyroidism    N&V (nausea and vomiting)    Peripheral neuropathy    Right foot no sensation   Sciatica    Sleep apnea    does not use CPAP   Thyroid disease  Cardiology Consultation   Patient ID: Caroline Williams MRN: [id number]; DOB: December 12, 1960  Admit date: 03/21/2023 Date of Consult: 03/22/2023  PCP:  Caroline Register, MD   [facility] Providers Cardiologist:  Reatha Harps, MD  Cardiology APP:  Beatrice Lecher, PA-C  {  Patient Profile:   Caroline Williams is a 62 y.o. female with a hx of nonobstructive CAD, hypertension, hyperlipidemia on Praluent, DM2, COPD, tobacco abuse, PVCs, and family history of vascular disease, recent GI hemorrhage who is being seen 03/22/2023 for the evaluation of left atrial mass at the request of Dr. Tyson Williams.  History of Present Illness:   [name] established with cardiology in 2017 for evaluation of chest discomfort in the setting of morbid obesity, hypertension, hyperlipidemia, OSA, tobacco abuse, and family history of vascular disease.  She underwent nuclear stress test that was nonischemic.  She subsequently underwent CT coronary 2021 that showed coronary calcium score 49 placing her at the 8th percentile.  She was seen back in clinic June 2020 for reporting exertional chest pain and was recommended for repeat nuclear stress test.  She also reported palpitations and was recommended for heart monitor.  Heart monitor showed frequent PVCs with a burden of 13.1%.  Nuclear stress test was again nonischemic. Echo 12/2022 showed LVEF 50-55% with normal RV size and function, normal PASP, mild to moderate MR.   She presented to [facility] 03/21/23 with difficulty speaking and slurred speech when she woke up. She presented with expressive aphasia. CODE STROKE initiated, imaging consistent with acut ischemic infarct with bilateral L MCA distribution infarcts (frontal, parietal, temporal) R frontal lobe, chrnoic right frontal infarcts with etiology felt consistent with cardioembolic source. Echocardiogram recommended today and demonstrated left atrial mass with PFO and mass extending into the right atrium.   Pt  continues to speak in one word answers with difficulty following commands.  She tells me she is not in pain.  Very limited response to my questions.  Reviewed her echocardiogram.  I am quite concerned for right atrial thrombus as well as thrombus extending across the PFO into the left atrium.  Her echo also was concerning for right heart strain.  I do wonder if she has a submassive PE.     Past Medical History:  Diagnosis Date   Anxiety    Arthritis    Asthma    Bipolar 1 disorder (HCC)    Breast discharge    Breast lump    Breast pain    CAD (coronary artery disease) 12/03/2022    o Cath 2008: no CAD   o CCTA 10/05/19: CAC score 49, 88th percentile, nonobstructive CAD (LM 1-24 mid and dist)  o Myoview 12/31/20: EF 45, no ischemia or infarction, low risk    Chronic pain    on MS Contin and oxycodone   COPD (chronic obstructive pulmonary disease) (HCC)    Depression    Diabetes mellitus    diet and exercise controlled   Dysrhythmia    HR in 30s on metoprolol   Escherichia coli (E. coli) infection    Fever    GERD (gastroesophageal reflux disease)    History of chest pain    History of knee replacement, total    Hypertension    Hypothyroidism    N&V (nausea and vomiting)    Peripheral neuropathy    Right foot no sensation   Sciatica    Sleep apnea    does not use CPAP   Thyroid disease  Cardiology Consultation   Patient ID: Caroline Williams MRN: [id number]; DOB: December 12, 1960  Admit date: 03/21/2023 Date of Consult: 03/22/2023  PCP:  Caroline Register, MD   [facility] Providers Cardiologist:  Reatha Harps, MD  Cardiology APP:  Beatrice Lecher, PA-C  {  Patient Profile:   Caroline Williams is a 62 y.o. female with a hx of nonobstructive CAD, hypertension, hyperlipidemia on Praluent, DM2, COPD, tobacco abuse, PVCs, and family history of vascular disease, recent GI hemorrhage who is being seen 03/22/2023 for the evaluation of left atrial mass at the request of Dr. Tyson Williams.  History of Present Illness:   [name] established with cardiology in 2017 for evaluation of chest discomfort in the setting of morbid obesity, hypertension, hyperlipidemia, OSA, tobacco abuse, and family history of vascular disease.  She underwent nuclear stress test that was nonischemic.  She subsequently underwent CT coronary 2021 that showed coronary calcium score 49 placing her at the 8th percentile.  She was seen back in clinic June 2020 for reporting exertional chest pain and was recommended for repeat nuclear stress test.  She also reported palpitations and was recommended for heart monitor.  Heart monitor showed frequent PVCs with a burden of 13.1%.  Nuclear stress test was again nonischemic. Echo 12/2022 showed LVEF 50-55% with normal RV size and function, normal PASP, mild to moderate MR.   She presented to [facility] 03/21/23 with difficulty speaking and slurred speech when she woke up. She presented with expressive aphasia. CODE STROKE initiated, imaging consistent with acut ischemic infarct with bilateral L MCA distribution infarcts (frontal, parietal, temporal) R frontal lobe, chrnoic right frontal infarcts with etiology felt consistent with cardioembolic source. Echocardiogram recommended today and demonstrated left atrial mass with PFO and mass extending into the right atrium.   Pt  continues to speak in one word answers with difficulty following commands.  She tells me she is not in pain.  Very limited response to my questions.  Reviewed her echocardiogram.  I am quite concerned for right atrial thrombus as well as thrombus extending across the PFO into the left atrium.  Her echo also was concerning for right heart strain.  I do wonder if she has a submassive PE.     Past Medical History:  Diagnosis Date   Anxiety    Arthritis    Asthma    Bipolar 1 disorder (HCC)    Breast discharge    Breast lump    Breast pain    CAD (coronary artery disease) 12/03/2022    o Cath 2008: no CAD   o CCTA 10/05/19: CAC score 49, 88th percentile, nonobstructive CAD (LM 1-24 mid and dist)  o Myoview 12/31/20: EF 45, no ischemia or infarction, low risk    Chronic pain    on MS Contin and oxycodone   COPD (chronic obstructive pulmonary disease) (HCC)    Depression    Diabetes mellitus    diet and exercise controlled   Dysrhythmia    HR in 30s on metoprolol   Escherichia coli (E. coli) infection    Fever    GERD (gastroesophageal reflux disease)    History of chest pain    History of knee replacement, total    Hypertension    Hypothyroidism    N&V (nausea and vomiting)    Peripheral neuropathy    Right foot no sensation   Sciatica    Sleep apnea    does not use CPAP   Thyroid disease  03/22/2023    ECHOCARDIOGRAM REPORT   Patient Name:   Caroline Williams Date of Exam: 03/22/2023 Medical Rec #:  [id number]       Height:       65.0 in Accession #:    [id number]      Weight:       215.0 lb Date of Birth:  20-Oct-1960       BSA:          2.040 m Patient Age:    62 years        BP:            142/88 mmHg Patient Gender: F               HR:           110 bpm. Exam Location:  Inpatient Procedure: 2D Echo, Cardiac Doppler, Color Doppler and Intracardiac            Opacification Agent Indications:    Stroke I63.9  History:        Patient has prior history of Echocardiogram examinations, most                 recent 12/28/2022. CAD, Stroke and COPD, Arrythmias:PVC,                 Signs/Symptoms:Chest Pain; Risk Factors:Hypertension, Diabetes,                 Dyslipidemia, Current Smoker and Sleep Apnea. CKD, stage 3.  Sonographer:    [name] RCS Referring Phys: [id number] ASIA B ZIERLE-GHOSH  Sonographer Comments: Image acquisition challenging due to uncooperative patient. IMPRESSIONS  1. There is a mobile clot/thrombus in transit sitting in the interatrial septum.  2. Left ventricular ejection fraction, by estimation, is 40 to 45%. The left ventricle has mildly decreased function. The left ventricle demonstrates global hypokinesis. Left ventricular diastolic parameters are indeterminate.  3. Right ventricular systolic function is normal. The right ventricular size is normal.  4. Thrombus noted in the left atrium.  5. The mitral valve is normal in structure. No evidence of mitral valve regurgitation. No evidence of mitral stenosis.  6. The aortic valve is normal in structure. Aortic valve regurgitation is not visualized. No aortic stenosis is present.  7. There is borderline dilatation of the ascending aorta, measuring 36 mm.  8. The inferior vena cava is normal in size with greater than 50% respiratory variability, suggesting right atrial pressure of 3 mmHg.  9. Thrombus seen in the right atrium. FINDINGS  Left Ventricle: Left ventricular ejection fraction, by estimation, is 40 to 45%. The left ventricle has mildly decreased function. The left ventricle demonstrates global hypokinesis. Definity contrast agent was given IV to delineate the left ventricular  endocardial borders. The left ventricular  internal cavity size was normal in size. There is no left ventricular hypertrophy. Left ventricular diastolic parameters are indeterminate. Right Ventricle: The right ventricular size is normal. No increase in right ventricular wall thickness. Right ventricular systolic function is normal. Left Atrium: Thrombus noted in the left atrium. Left atrial size was normal in size. Right Atrium: Thrombus seen in the right atrium. Right atrial size was normal in size. Pericardium: There is no evidence of pericardial effusion. Mitral Valve: The mitral valve is normal in structure. No evidence of mitral valve regurgitation. No evidence of mitral valve stenosis. Tricuspid Valve: The tricuspid valve is normal in structure. Tricuspid valve regurgitation is not demonstrated. No evidence of tricuspid stenosis. Aortic Valve:  Cardiology Consultation   Patient ID: Caroline Williams MRN: [id number]; DOB: December 12, 1960  Admit date: 03/21/2023 Date of Consult: 03/22/2023  PCP:  Caroline Register, MD   [facility] Providers Cardiologist:  Reatha Harps, MD  Cardiology APP:  Beatrice Lecher, PA-C  {  Patient Profile:   Caroline Williams is a 62 y.o. female with a hx of nonobstructive CAD, hypertension, hyperlipidemia on Praluent, DM2, COPD, tobacco abuse, PVCs, and family history of vascular disease, recent GI hemorrhage who is being seen 03/22/2023 for the evaluation of left atrial mass at the request of Dr. Tyson Williams.  History of Present Illness:   [name] established with cardiology in 2017 for evaluation of chest discomfort in the setting of morbid obesity, hypertension, hyperlipidemia, OSA, tobacco abuse, and family history of vascular disease.  She underwent nuclear stress test that was nonischemic.  She subsequently underwent CT coronary 2021 that showed coronary calcium score 49 placing her at the 8th percentile.  She was seen back in clinic June 2020 for reporting exertional chest pain and was recommended for repeat nuclear stress test.  She also reported palpitations and was recommended for heart monitor.  Heart monitor showed frequent PVCs with a burden of 13.1%.  Nuclear stress test was again nonischemic. Echo 12/2022 showed LVEF 50-55% with normal RV size and function, normal PASP, mild to moderate MR.   She presented to [facility] 03/21/23 with difficulty speaking and slurred speech when she woke up. She presented with expressive aphasia. CODE STROKE initiated, imaging consistent with acut ischemic infarct with bilateral L MCA distribution infarcts (frontal, parietal, temporal) R frontal lobe, chrnoic right frontal infarcts with etiology felt consistent with cardioembolic source. Echocardiogram recommended today and demonstrated left atrial mass with PFO and mass extending into the right atrium.   Pt  continues to speak in one word answers with difficulty following commands.  She tells me she is not in pain.  Very limited response to my questions.  Reviewed her echocardiogram.  I am quite concerned for right atrial thrombus as well as thrombus extending across the PFO into the left atrium.  Her echo also was concerning for right heart strain.  I do wonder if she has a submassive PE.     Past Medical History:  Diagnosis Date   Anxiety    Arthritis    Asthma    Bipolar 1 disorder (HCC)    Breast discharge    Breast lump    Breast pain    CAD (coronary artery disease) 12/03/2022    o Cath 2008: no CAD   o CCTA 10/05/19: CAC score 49, 88th percentile, nonobstructive CAD (LM 1-24 mid and dist)  o Myoview 12/31/20: EF 45, no ischemia or infarction, low risk    Chronic pain    on MS Contin and oxycodone   COPD (chronic obstructive pulmonary disease) (HCC)    Depression    Diabetes mellitus    diet and exercise controlled   Dysrhythmia    HR in 30s on metoprolol   Escherichia coli (E. coli) infection    Fever    GERD (gastroesophageal reflux disease)    History of chest pain    History of knee replacement, total    Hypertension    Hypothyroidism    N&V (nausea and vomiting)    Peripheral neuropathy    Right foot no sensation   Sciatica    Sleep apnea    does not use CPAP   Thyroid disease

## 2023-03-22 NOTE — Progress Notes (Addendum)
PHARMACY - ANTICOAGULATION CONSULT NOTE  Pharmacy Consult for heparin  Indication:  coronary thrombus  Allergies  Allergen Reactions   Aspirin Other (See Comments), Shortness Of Breath and Anaphylaxis    Pt states she has had Toradol several times without any reactions  Other Reaction(s): Other (See Comments), respiratory distress   Bee Venom Anaphylaxis   Sulfa Antibiotics Anaphylaxis   Evolocumab     Other Reaction(s): Unknown   Nsaids Other (See Comments)    History of gastric bypass   Other Other (See Comments)    No blood products.  Patient did  Request that only albumin or albumin-containing products may be administered    Patient Measurements: Height: 5\' 5"  (165.1 cm) Weight: 97.5 kg (215 lb) (weight from 8/24) IBW/kg (Calculated) : 57 Heparin Dosing Weight: 79.1 kg  Vital Signs: Temp: 97.8 F (36.6 C) (10/08 1458) Temp Source: Oral (10/08 1458) BP: 146/98 (10/08 1445) Pulse Rate: 56 (10/08 1445)  Labs: Recent Labs    03/21/23 1746 03/21/23 1750 03/22/23 1211  HGB 10.4* 11.9* 9.3*  HCT 33.9* 35.0* 30.1*  PLT 366  --  312  APTT 30  --   --   LABPROT 13.6  --   --   INR 1.0  --   --   CREATININE 1.68* 1.60* 1.56*    Estimated Creatinine Clearance: 43.2 mL/min (A) (by C-G formula based on SCr of 1.56 mg/dL (H)).   Medical History: Past Medical History:  Diagnosis Date   Anxiety    Arthritis    Asthma    Bipolar 1 disorder (HCC)    Breast discharge    Breast lump    Breast pain    CAD (coronary artery disease) 12/03/2022    o Cath 2008: no CAD   o CCTA 10/05/19: CAC score 49, 88th percentile, nonobstructive CAD (LM 1-24 mid and dist)  o Myoview 12/31/20: EF 45, no ischemia or infarction, low risk    Chronic pain    on MS Contin and oxycodone   COPD (chronic obstructive pulmonary disease) (HCC)    Depression    Diabetes mellitus    diet and exercise controlled   Dysrhythmia    HR in 30s on metoprolol   Escherichia coli (E. coli) infection     Fever    GERD (gastroesophageal reflux disease)    History of chest pain    History of knee replacement, total    Hypertension    Hypothyroidism    N&V (nausea and vomiting)    Peripheral neuropathy    Right foot no sensation   Sciatica    Sleep apnea    does not use CPAP   Thyroid disease    Wears glasses     Assessment: 61YOF with a PMH of anxiety, bipolar 1 disorder, coronary artery disease, COPD, depression, diabetes mellitus type 2, history of Roux-en-Y, GERD, hypothyroidism, hypertension, neuropathy presented with left MCA stroke with concern for right hemisphere involvement and concern for central embolic stroke. Patient now found to have coronary thrombus. Pharmacy consulted to dose heparin.  Hgb 9.3 low, Plt 312 wnl No signs/symptoms of bleeding noted. No bolus per MD and will have lower therapeutic goal due to CVA.  Goal of Therapy:  Heparin level 0.3-0.5 units/ml Monitor platelets by anticoagulation protocol: Yes   Plan:  Start heparin infusion at 1000 units/hr Check anti-Xa level in 6 hours and daily while on heparin Continue to monitor CBC and signs/symptoms of bleeding   Stephenie Acres, PharmD PGY1 Pharmacy  Resident 03/22/2023 3:45 PM

## 2023-03-22 NOTE — Anesthesia Preprocedure Evaluation (Signed)
Anesthesia Evaluation  Patient identified by MRN, date of birth, ID band Patient awake    Reviewed: Allergy & Precautions, NPO status , Patient's Chart, lab work & pertinent test results  Airway       Comment: Previous grade IV view with MAC 3 and 4 Dental   Pulmonary asthma , sleep apnea , COPD, Patient abstained from smoking., former smoker Acute bilateral pulmonary emboli          Cardiovascular hypertension, Pt. on home beta blockers and Pt. on medications + CAD (nonobstructive)  (-) dysrhythmias (PVCs) + Valvular Problems/Murmurs (mild-to-moderate MR)   HLD  Normal stress test 12/28/2022  TTE 03/22/2023: IMPRESSIONS     1. There is a mobile clot/thrombus in transit sitting in the interatrial  septum.   2. Left ventricular ejection fraction, by estimation, is 40 to 45%. The  left ventricle has mildly decreased function. The left ventricle  demonstrates global hypokinesis. Left ventricular diastolic parameters are  indeterminate.   3. Right ventricular systolic function is normal. The right ventricular  size is normal.   4. Thrombus noted in the left atrium.   5. The mitral valve is normal in structure. No evidence of mitral valve  regurgitation. No evidence of mitral stenosis.   6. The aortic valve is normal in structure. Aortic valve regurgitation is  not visualized. No aortic stenosis is present.   7. There is borderline dilatation of the ascending aorta, measuring 36  mm.   8. The inferior vena cava is normal in size with greater than 50%  respiratory variability, suggesting right atrial pressure of 3 mmHg.   9. Thrombus seen in the right atrium.     Neuro/Psych  PSYCHIATRIC DISORDERS Anxiety Depression Bipolar Disorder   Chronic pain on MS Contin  Neuromuscular disease (peripheral neuropathy, sciatica, lumbar spondylosis) CVA    GI/Hepatic ,GERD  Medicated,,H/o Roux-en-Y gastric bypass   Endo/Other  diabetes,  Type 2Hypothyroidism    Renal/GU CRFRenal disease     Musculoskeletal  (+) Arthritis ,    Abdominal   Peds  Hematology  (+) Blood dyscrasia, anemia Lab Results      Component                Value               Date                      WBC                      9.3                 03/22/2023                HGB                      9.3 (L)             03/22/2023                HCT                      30.1 (L)            03/22/2023                MCV  85.3                03/22/2023                PLT                      312                 03/22/2023              Anesthesia Other Findings   Reproductive/Obstetrics                             Anesthesia Physical Anesthesia Plan  ASA: 4  Anesthesia Plan: MAC   Post-op Pain Management:    Induction: Intravenous  PONV Risk Score and Plan: 2 and Propofol infusion and Treatment may vary due to age or medical condition  Airway Management Planned: Natural Airway and Simple Face Mask  Additional Equipment:   Intra-op Plan:   Post-operative Plan:   Informed Consent:      Dental advisory given  Plan Discussed with: CRNA and Anesthesiologist  Anesthesia Plan Comments: (Discussed with patient risks of MAC including, but not limited to, minor pain or discomfort, hearing people in the room, and possible need for backup general anesthesia. Risks for general anesthesia also discussed including, but not limited to, sore throat, hoarse voice, chipped/damaged teeth, injury to vocal cords, nausea and vomiting, allergic reactions, lung infection, heart attack, stroke, and death. All questions answered. )       Anesthesia Quick Evaluation

## 2023-03-22 NOTE — Plan of Care (Signed)
Problem: Fluid Volume: Goal: Hemodynamic stability will improve Outcome: Progressing   Problem: Clinical Measurements: Goal: Diagnostic test results will improve Outcome: Progressing Goal: Signs and symptoms of infection will decrease Outcome: Progressing   Problem: Respiratory: Goal: Ability to maintain adequate ventilation will improve 03/22/2023 1819 by Thom Chimes, RN Outcome: Progressing 03/22/2023 1817 by Thom Chimes, RN Outcome: Progressing   Problem: Education: Goal: Ability to verbalize activity precautions or restrictions will improve Outcome: Progressing Goal: Knowledge of the prescribed therapeutic regimen will improve Outcome: Progressing Goal: Understanding of discharge needs will improve Outcome: Progressing   Problem: Activity: Goal: Ability to avoid complications of mobility impairment will improve 03/22/2023 1819 by Thom Chimes, RN Outcome: Progressing 03/22/2023 1817 by Thom Chimes, RN Outcome: Progressing Goal: Ability to tolerate increased activity will improve 03/22/2023 1819 by Thom Chimes, RN Outcome: Progressing 03/22/2023 1817 by Thom Chimes, RN Outcome: Progressing Goal: Will remain free from falls 03/22/2023 1819 by Thom Chimes, RN Outcome: Progressing 03/22/2023 1817 by Thom Chimes, RN Outcome: Progressing 03/22/2023 1817 by Thom Chimes, RN Outcome: Progressing   Problem: Bowel/Gastric: Goal: Gastrointestinal status for postoperative course will improve Outcome: Progressing   Problem: Clinical Measurements: Goal: Ability to maintain clinical measurements within normal limits will improve Outcome: Progressing Goal: Postoperative complications will be avoided or minimized Outcome: Progressing Goal: Diagnostic test results will improve Outcome: Progressing   Problem: Pain Management: Goal: Pain level will decrease 03/22/2023 1819 by Thom Chimes, RN Outcome: Progressing 03/22/2023 1817 by  Thom Chimes, RN Outcome: Progressing   Problem: Skin Integrity: Goal: Will show signs of wound healing 03/22/2023 1819 by Thom Chimes, RN Outcome: Progressing 03/22/2023 1817 by Thom Chimes, RN Outcome: Progressing 03/22/2023 1817 by Thom Chimes, RN Outcome: Progressing   Problem: Health Behavior/Discharge Planning: Goal: Identification of resources available to assist in meeting health care needs will improve Outcome: Progressing   Problem: Bladder/Genitourinary: Goal: Urinary functional status for postoperative course will improve Outcome: Progressing   Problem: Education: Goal: Knowledge of disease or condition will improve Outcome: Progressing Goal: Knowledge of secondary prevention will improve (MUST DOCUMENT ALL) Outcome: Progressing Goal: Knowledge of patient specific risk factors will improve Loraine Leriche N/A or DELETE if not current risk factor) Outcome: Progressing   Problem: Ischemic Stroke/TIA Tissue Perfusion: Goal: Complications of ischemic stroke/TIA will be minimized 03/22/2023 1819 by Thom Chimes, RN Outcome: Progressing 03/22/2023 1817 by Thom Chimes, RN Outcome: Progressing   Problem: Coping: Goal: Will verbalize positive feelings about self 03/22/2023 1819 by Thom Chimes, RN Outcome: Progressing 03/22/2023 1817 by Thom Chimes, RN Outcome: Progressing 03/22/2023 1817 by Thom Chimes, RN Outcome: Progressing Goal: Will identify appropriate support needs Outcome: Progressing   Problem: Health Behavior/Discharge Planning: Goal: Ability to manage health-related needs will improve Outcome: Progressing Goal: Goals will be collaboratively established with patient/family Outcome: Progressing   Problem: Self-Care: Goal: Ability to participate in self-care as condition permits will improve Outcome: Progressing Goal: Verbalization of feelings and concerns over difficulty with self-care will improve Outcome:  Progressing Goal: Ability to communicate needs accurately will improve Outcome: Progressing   Problem: Nutrition: Goal: Risk of aspiration will decrease 03/22/2023 1817 by Thom Chimes, RN Outcome: Progressing 03/22/2023 1817 by Thom Chimes, RN Outcome: Progressing Goal: Dietary intake will improve Outcome: Progressing   Problem: Education: Goal: Knowledge of General Education information will improve Description: Including pain rating scale, medication(s)/side effects and non-pharmacologic comfort measures Outcome: Progressing  Problem: Health Behavior/Discharge Planning: Goal: Ability to manage health-related needs will improve Outcome: Progressing   Problem: Clinical Measurements: Goal: Ability to maintain clinical measurements within normal limits will improve Outcome: Progressing Goal: Will remain free from infection Outcome: Progressing Goal: Diagnostic test results will improve Outcome: Progressing Goal: Respiratory complications will improve Outcome: Progressing Goal: Cardiovascular complication will be avoided Outcome: Progressing   Problem: Activity: Goal: Risk for activity intolerance will decrease 03/22/2023 1817 by Thom Chimes, RN Outcome: Progressing 03/22/2023 1817 by Thom Chimes, RN Outcome: Progressing   Problem: Nutrition: Goal: Adequate nutrition will be maintained Outcome: Progressing   Problem: Coping: Goal: Level of anxiety will decrease 03/22/2023 1817 by Thom Chimes, RN Outcome: Progressing 03/22/2023 1817 by Thom Chimes, RN Outcome: Progressing   Problem: Elimination: Goal: Will not experience complications related to bowel motility Outcome: Progressing Goal: Will not experience complications related to urinary retention Outcome: Progressing   Problem: Pain Managment: Goal: General experience of comfort will improve Outcome: Progressing   Problem: Safety: Goal: Ability to remain free from injury will  improve Outcome: Progressing

## 2023-03-22 NOTE — Progress Notes (Addendum)
STROKE TEAM PROGRESS NOTE   BRIEF HPI [name] is a 62 y.o. female with history of  anxiety, bipolar 1 disorder, coronary artery disease, COPD, depression, diabetes mellitus type 2, history of Roux-en-Y, GERD, hypothyroidism, hypertension, neuropathy presenting with a 2 day history of slurred, dysarthric speech, cognitive difficulties, diminished sensation, and motor difficulties. The patient is accompanied by her boyfriend who provided most of the history. The patient was last normal around 7 or 730 pm on 10/6, and when he saw her at 10 pm, her speech was off. She continued to speak gibberish and have motor difficulties (dragging her right leg). She walks with a cane at baseline, but has been unable to manage that. The pt's boyfriend attributes this to her previous hospitalization including the transfusions that he believes may have contributed to her stroke.  She came to the ED on 10/7. She got a CT head and two MRIs; the MRI head showed acute bilateral ischemic infarcts. This suggested a cardioembolic source. No TNK or Thrombectomy indicated as she is outside the window for both.   SIGNIFICANT HOSPITAL EVENTS 10/7 - Admitted to ED, MRIs conducted 10/8 - Pt continues to demonstrate cognitive deficits, neurology team recommending a TEE, starting DAPT, and a statin  INTERIM HISTORY/SUBJECTIVE Met patient bedside with attending physician Dr Viviann Spare. History was provided by the patient's boyfriend Hilma Favors) who was also present. Pt was unable to speak more than a few words and had difficulty following directions at varying points in the interview. Pt was last normal 10/6, and has had continued but not worsening difficulties. She is able to nod "yes/no" and can speak a few words at a time.  OBJECTIVE  CBC    Component Value Date/Time   WBC 9.7 03/21/2023 1746   RBC 3.95 03/21/2023 1746   HGB 11.9 (L) 03/21/2023 1750   HGB 13.1 05/21/2021 0846   HCT 35.0 (L) 03/21/2023 1750    HCT 40.9 05/21/2021 0846   PLT 366 03/21/2023 1746   PLT 503 (H) 05/21/2021 0846   MCV 85.8 03/21/2023 1746   MCV 83 05/21/2021 0846   MCH 26.3 03/21/2023 1746   MCHC 30.7 03/21/2023 1746   RDW 16.5 (H) 03/21/2023 1746   RDW 13.3 05/21/2021 0846   LYMPHSABS 1.3 03/21/2023 1746   LYMPHSABS 1.2 05/21/2021 0846   MONOABS 0.6 03/21/2023 1746   EOSABS 0.0 03/21/2023 1746   EOSABS 0.1 05/21/2021 0846   BASOSABS 0.0 03/21/2023 1746   BASOSABS 0.0 05/21/2021 0846    BMET    Component Value Date/Time   NA 140 03/21/2023 1750   NA 138 10/07/2022 1443   K 4.1 03/21/2023 1750   CL 107 03/21/2023 1750   CO2 21 (L) 03/21/2023 1746   GLUCOSE 112 (H) 03/21/2023 1750   BUN 20 03/21/2023 1750   BUN 19 10/07/2022 1443   CREATININE 1.60 (H) 03/21/2023 1750   CALCIUM 9.1 03/21/2023 1746   EGFR 43 (L) 10/07/2022 1443   GFRNONAA 34 (L) 03/21/2023 1746    IMAGING past 24 hours MR Brain Wo Contrast (neuro protocol)  Result Date: 03/22/2023 CLINICAL DATA:  Initial evaluation for acute aphasia. EXAM: MRI HEAD WITHOUT CONTRAST MRA HEAD WITHOUT CONTRAST TECHNIQUE: Multiplanar, multi-echo pulse sequences of the brain and surrounding structures were acquired without intravenous contrast. Angiographic images of the Circle of Willis were acquired using MRA technique without intravenous contrast. COMPARISON:  Prior CT from earlier the same day. FINDINGS: MRI HEAD FINDINGS Brain: Cerebral volume within normal limits.  Chronic right frontal infarct with associated chronic hemosiderin staining. Patchy restricted diffusion involving the cortical left frontal, parietal, and temporal lobes, consistent with an acute left MCA distribution infarct. Minimal patchy subcortical involvement about the left basal ganglia. 9 mm acute ischemic infarct involving the contralateral right frontal centrum semi ovale (series 2, image 38). Few additional scattered small volume bilateral cerebellar infarcts, left larger than right. No  associated hemorrhage or significant mass effect. A central thromboembolic etiology is suspected. No mass lesion or midline shift. No hydrocephalus or extra-axial fluid collection. Empty sella noted. Vascular: Major intracranial vascular flow voids are maintained. Skull and upper cervical spine: Craniocervical junction within normal limits. Bone marrow signal intensity normal. No scalp soft tissue abnormality. Sinuses/Orbits: Globes orbital soft tissues within normal limits. Paranasal sinuses are largely clear. Trace left mastoid effusion, of doubtful significance. Other: None. MRA HEAD FINDINGS Anterior circulation: Examination degraded by motion artifact. Both internal carotid arteries are patent to the termini without significant stenosis. A1 segments patent bilaterally. Grossly normal anterior communicating artery complex. Partially visualized ACAs grossly patent to their distal aspects without visible stenosis. No visible M1 stenosis or occlusion. No visible proximal MCA branch occlusion or high-grade stenosis. Distal MCA branches not well assessed on this motion degraded exam. Posterior circulation: Right vertebral artery dominant and widely patent without stenosis. Right PICA patent. Left vertebral artery hypoplastic and largely terminates in PICA. Left PICA patent as well. Basilar patent without stenosis. Superior cerebellar arteries not well seen on this motion degraded exam. Both PCAs appear to be primarily supplied via the basilar and are grossly patent to their distal aspects without visible stenosis. Anatomic variants: As above.  No visible aneurysm. IMPRESSION: MRI HEAD IMPRESSION: 1. Patchy acute left MCA distribution infarct involving the left frontal, parietal, and temporal lobes. No associated hemorrhage or significant mass effect. 2. Additional small volume acute ischemic nonhemorrhagic infarcts involving the contralateral right frontal centrum semi ovale and left greater than right cerebellum.  Given the various vascular distributions involved, a central thromboembolic etiology is suspected. 3. Chronic right frontal infarct. MRA HEAD IMPRESSION: 1. Motion degraded exam. 2. Grossly negative intracranial MRA for large vessel occlusion. No visible hemodynamically significant or correctable stenosis. Electronically Signed   By: Rise Mu M.D.   On: 03/22/2023 00:41   MR ANGIO HEAD WO CONTRAST  Result Date: 03/22/2023 CLINICAL DATA:  Initial evaluation for acute aphasia. EXAM: MRI HEAD WITHOUT CONTRAST MRA HEAD WITHOUT CONTRAST TECHNIQUE: Multiplanar, multi-echo pulse sequences of the brain and surrounding structures were acquired without intravenous contrast. Angiographic images of the Circle of Willis were acquired using MRA technique without intravenous contrast. COMPARISON:  Prior CT from earlier the same day. FINDINGS: MRI HEAD FINDINGS Brain: Cerebral volume within normal limits. Chronic right frontal infarct with associated chronic hemosiderin staining. Patchy restricted diffusion involving the cortical left frontal, parietal, and temporal lobes, consistent with an acute left MCA distribution infarct. Minimal patchy subcortical involvement about the left basal ganglia. 9 mm acute ischemic infarct involving the contralateral right frontal centrum semi ovale (series 2, image 38). Few additional scattered small volume bilateral cerebellar infarcts, left larger than right. No associated hemorrhage or significant mass effect. A central thromboembolic etiology is suspected. No mass lesion or midline shift. No hydrocephalus or extra-axial fluid collection. Empty sella noted. Vascular: Major intracranial vascular flow voids are maintained. Skull and upper cervical spine: Craniocervical junction within normal limits. Bone marrow signal intensity normal. No scalp soft tissue abnormality. Sinuses/Orbits: Globes orbital soft tissues within normal limits.  Paranasal sinuses are largely clear. Trace left  mastoid effusion, of doubtful significance. Other: None. MRA HEAD FINDINGS Anterior circulation: Examination degraded by motion artifact. Both internal carotid arteries are patent to the termini without significant stenosis. A1 segments patent bilaterally. Grossly normal anterior communicating artery complex. Partially visualized ACAs grossly patent to their distal aspects without visible stenosis. No visible M1 stenosis or occlusion. No visible proximal MCA branch occlusion or high-grade stenosis. Distal MCA branches not well assessed on this motion degraded exam. Posterior circulation: Right vertebral artery dominant and widely patent without stenosis. Right PICA patent. Left vertebral artery hypoplastic and largely terminates in PICA. Left PICA patent as well. Basilar patent without stenosis. Superior cerebellar arteries not well seen on this motion degraded exam. Both PCAs appear to be primarily supplied via the basilar and are grossly patent to their distal aspects without visible stenosis. Anatomic variants: As above.  No visible aneurysm. IMPRESSION: MRI HEAD IMPRESSION: 1. Patchy acute left MCA distribution infarct involving the left frontal, parietal, and temporal lobes. No associated hemorrhage or significant mass effect. 2. Additional small volume acute ischemic nonhemorrhagic infarcts involving the contralateral right frontal centrum semi ovale and left greater than right cerebellum. Given the various vascular distributions involved, a central thromboembolic etiology is suspected. 3. Chronic right frontal infarct. MRA HEAD IMPRESSION: 1. Motion degraded exam. 2. Grossly negative intracranial MRA for large vessel occlusion. No visible hemodynamically significant or correctable stenosis. Electronically Signed   By: Rise Mu M.D.   On: 03/22/2023 00:41   DG CHEST PORT 1 VIEW  Result Date: 03/21/2023 CLINICAL DATA:  Altered mental status.  MRI clearance. EXAM: PORTABLE CHEST 1 VIEW  COMPARISON:  Chest radiograph 01/30/2023 FINDINGS: No implanted medical device or radiopaque foreign body to preclude MRI imaging. Cardiomegaly is stable. Minor atelectasis at the left lung base. No confluent airspace disease, large pleural effusion or pneumothorax. IMPRESSION: 1. No implanted medical device or radiopaque foreign body to preclude MRI imaging. 2. Stable cardiomegaly. Minor left basilar atelectasis. Electronically Signed   By: Narda Rutherford M.D.   On: 03/21/2023 21:54   DG Abd 1 View  Result Date: 03/21/2023 CLINICAL DATA:  Altered mental status.  MRI clearance. EXAM: ABDOMEN - 1 VIEW COMPARISON:  CT 02/05/2023 FINDINGS: Lumbosacral fusion hardware. Enteric sutures in the left upper quadrant. No radiopaque foreign body or implanted medical device to preclude MRI imaging. Nonobstructive bowel gas pattern. Chronic changes about the sacroiliac joints and pubic symphysis. IMPRESSION: 1. No radiopaque foreign body or implanted medical device to preclude MRI imaging. 2. Enteric sutures in the left upper quadrant. Lumbosacral fusion hardware. Electronically Signed   By: Narda Rutherford M.D.   On: 03/21/2023 21:53   CT HEAD WO CONTRAST  Result Date: 03/21/2023 CLINICAL DATA:  Neurological deficit. EXAM: CT HEAD WITHOUT CONTRAST TECHNIQUE: Contiguous axial images were obtained from the base of the skull through the vertex without intravenous contrast. RADIATION DOSE REDUCTION: This exam was performed according to the departmental dose-optimization program which includes automated exposure control, adjustment of the mA and/or kV according to patient size and/or use of iterative reconstruction technique. COMPARISON:  None Available. FINDINGS: Brain: There is mild cerebral atrophy with widening of the extra-axial spaces and ventricular dilatation. There are areas of decreased attenuation within the white matter tracts of the supratentorial brain, consistent with microvascular disease changes. Chronic  right frontal lobe and left posterior parietal infarcts are seen. Vascular: No hyperdense vessel or unexpected calcification. Skull: Normal. Negative for fracture or focal lesion. Sinuses/Orbits:  No acute finding. Other: None. IMPRESSION: 1. Generalized cerebral atrophy with chronic white matter small vessel ischemic changes. 2. Chronic right frontal lobe and left posterior parietal infarcts. 3. No acute intracranial abnormality. Electronically Signed   By: Aram Candela M.D.   On: 03/21/2023 17:49    Vitals:   03/22/23 0730 03/22/23 0733 03/22/23 0759 03/22/23 1211  BP: (!) 154/110  (!) 142/88   Pulse:  (!) 105 (!) 108   Resp: (!) 27 20 (!) 21   Temp:   97.8 F (36.6 C) 97.8 F (36.6 C)  TempSrc:   Oral Oral  SpO2:  97% 98%      PHYSICAL EXAM General:  Alert, obese African-American woman, pt is acutely anxious and concerned.  Psych:  Mood and affect appropriate for situation CV: Regular rate and rhythm on monitor. Pt hypertensive. Respiratory:  Regular, unlabored respirations on room air GI: Abdomen soft and nontender   NEURO:  Mental Status: Pt is unable to state day, month, or year. She understands the question, but is alternately producing word salad (words with no relation) and gibberish. Speech/Language: Aphasic, dysarthric speech. Naming, repetition, fluency, and comprehension all impaired.  Cranial Nerves:  II: PERRL. Visual fields full.  III, IV, VI: EOMI. Eyelids elevate symmetrically.  V: Sensation is intact to light touch and symmetrical to face.  VII: Face is symmetrical resting and smiling (no droop) VIII: hearing intact to voice. IX, X: Palate elevates symmetrically. Phonation is normal.  [id number] shrug 5/5. XII: tongue is midline without fasciculations. Motor: Left UE sensation intact, 5/5 strength. RUE sensation diminished, strength 3/5. LLE sensation normal 4/5 strength, RLE sensation grossly impaired (pt not responding to touch), but patient cannot  explain how, RLE 2+/5. Tone: is normal and bulk is normal Sensation- Sensation diminished on R below pyramidal decussation.   Coordination: Pt unable to concentrate long enough to complete this. Appears grossly intact. Gait- Pt unsteady at baseline, but patient was leaning heavily on objects as she moved from bathroom to bed (observed prior to exam).  ASSESSMENT/PLAN Acute Ischemic Infarct:  bilateral L MCA distribution infarcts (frontal, parietal, temporal) R frontal lobe, chronic right frontal infarcts Etiology:  Likely cardio embolic given the bilateral acute infarcts CT head 10/7 1715 IMPRESSION: 1. Generalized cerebral atrophy with chronic white matter small vessel ischemic changes. 2. Chronic right frontal lobe and left posterior parietal infarcts. 3. No acute intracranial abnormality. MRI HEAD 10/7 2153 IMPRESSION:  1. Patchy acute left MCA distribution infarct involving the left frontal, parietal, and temporal lobes. No associated hemorrhage or significant mass effect.  2. Additional small volume acute ischemic nonhemorrhagic infarcts involving the contralateral right frontal centrum semi ovale and left greater than right cerebellum. Given the various vascular distributions involved, a central thromboembolic etiology is suspected.  3. Chronic right frontal infarct.  MRA 10/7 2221 HEAD IMPRESSION: 1. Motion degraded exam. 2. Grossly negative intracranial MRA for large vessel occlusion. No visible hemodynamically significant or correctable stenosis.  Transesophageal Echocardiogram (TEE) - Pending, contacted cardiology master 10/7 LDL 139 HgbA1c 5.3 VTE prophylaxis - heparin 5000 units subcutaneous Q24 No antithrombotic prior to admission, now on clopidogrel 75 mg daily and cilostazol 50 mg daily  for 3 weeks and then plavix alone. Therapy recommendations:  Pending Disposition:  Admit to medical floor  Atrial fibrillation - EKGs do not suggest, but awaiting TEE Home Meds:  none Continue telemetry monitoring  Hypertension Home meds:  imdur 30 mg Q 24, olmesartan-amlodipine-hydrochlorothiazide 40-5-25 mg daily, metoprolol 25 Q24, spironolactone 25  mg daily Stable Blood Pressure Goal: SBP 120-160 for first 24 hours then less than 180   Hyperlipidemia Home meds:  none,  LDL 139, goal < 70 Add rosuvastatin 40 mg daily Continue statin at discharge  Diabetes type II Controlled Home meds:  farxiga,  HgbA1c 5.3, goal < 7.0 CBGs SSI  Tobacco Abuse Patient does not smoke. Former, 11 pack years.  Substance Abuse Patient denies  Dysphagia Patient has post-stroke dysphagia, SLP consulted    Diet   Diet Heart Room service appropriate? Yes; Fluid consistency: Thin   Other Stroke Risk Factors Obesity, There is no height or weight on file to calculate BMI., BMI >/= 30 associated with increased stroke risk, recommend weight loss, diet and exercise as appropriate  Family hx stroke (father) Coronary artery disease  Other Active Problems Managed per primary  Hospital day # 0  ATTENDING ATTESTATION:  62 year old with history of multiple risk factors for stroke and bipolar scattered left MCA ischemic and right frontal infarct suggesting cardioembolic source.  Continue to monitor of atrial fibrillation.  Will need TEE and loop monitor on discharge.  Carotid ultrasound has not been completed cardiology consulted.  Continue DAPT therapy for now.  PT OT and ongoing therapy.  Exam as above and completed with Dr. Weston Settle together.  ADDENDUM:  Echo shows thrombus in the right atrium.  No shunt Pharmacy consulted for heparin drip.  Cardiology reconsulted. Dr. Weston Settle will update the patient.  Continue close monitoring  Dr. Viviann Spare evaluated pt independently, reviewed imaging, chart, labs. Discussed and formulated plan with the Resident/APP. Changes were made to the note where appropriate. Please see APP/resident note above for details.   Total 36 minutes spent on  counseling patient and coordinating care, writing notes and reviewing chart.    Camary Sosa,MD   To contact Stroke Continuity provider, please refer to [email or web address]. After hours, contact General Neurology

## 2023-03-22 NOTE — ED Notes (Signed)
Patient is constantly yelling, verbalizing words that do not make complete sentences.

## 2023-03-22 NOTE — Progress Notes (Signed)
Echocardiogram 2D Echocardiogram has been performed.  [name] 03/22/2023, 1:43 PM

## 2023-03-22 NOTE — Assessment & Plan Note (Signed)
-   TSH markedly elevated last time it was checked approximately 6 weeks ago, Improved but still elevated. - Continue Synthroid-need to have a close follow-up with PCP for further dose adjustment

## 2023-03-22 NOTE — Progress Notes (Signed)
Received from ED on cart and got into the bed and within 20 min was taken down for u/s.  Oriented to room and surroundings.

## 2023-03-22 NOTE — ED Notes (Signed)
ED TO INPATIENT HANDOFF REPORT  ED Nurse Name and Phone #: cat [phone]  S Name/Age/Gender Caroline Williams 62 y.o. female Room/Bed: 040C/040C  Code Status   Code Status: Full Code  Home/SNF/Other Home Patient oriented to: self, place, time, and situation Is this baseline? Yes   Triage Complete: Triage complete  Chief Complaint CVA (cerebral vascular accident) [facility]) [I63.9]  Triage Note Pt arrives via EMS from home. Patient's friend ( who lives with her) reported she woke up this morning with difficulty speaking and slurred speech. Pt appears to have expressive aphasia. Pt is confused. Last known normal was 1500 yesterday.    Allergies Allergies  Allergen Reactions   Aspirin Other (See Comments), Shortness Of Breath and Anaphylaxis    Pt states she has had Toradol several times without any reactions  Other Reaction(s): Other (See Comments), respiratory distress   Bee Venom Anaphylaxis   Sulfa Antibiotics Anaphylaxis   Evolocumab     Other Reaction(s): Unknown   Nsaids Other (See Comments)    History of gastric bypass   Other Other (See Comments)    No blood products.  Patient did  Request that only albumin or albumin-containing products may be administered    Level of Care/Admitting Diagnosis ED Disposition     ED Disposition  Admit   Condition  --   Comment  Hospital Area: [facility] [[id number]]  Level of Care: Telemetry Medical [104]  May admit patient to [facility] or [facility] if equivalent level of care is available:: No  Covid Evaluation: Asymptomatic - no recent exposure (last 10 days) testing not required  Diagnosis: CVA (cerebral vascular accident) [facility]) [[id number]]  Admitting Physician: Dan Humphreys  Attending Physician: Joycelyn Das [phone]  Certification:: I certify this patient will need inpatient services for at least 2 midnights  Expected Medical Readiness: 03/24/2023          B Medical/Surgery  History Past Medical History:  Diagnosis Date   Anxiety    Arthritis    Asthma    Bipolar 1 disorder (HCC)    Breast discharge    Breast lump    Breast pain    CAD (coronary artery disease) 12/03/2022    o Cath 2008: no CAD   o CCTA 10/05/19: CAC score 49, 88th percentile, nonobstructive CAD (LM 1-24 mid and dist)  o Myoview 12/31/20: EF 45, no ischemia or infarction, low risk    Chronic pain    on MS Contin and oxycodone   COPD (chronic obstructive pulmonary disease) (HCC)    Depression    Diabetes mellitus    diet and exercise controlled   Dysrhythmia    HR in 30s on metoprolol   Escherichia coli (E. coli) infection    Fever    GERD (gastroesophageal reflux disease)    History of chest pain    History of knee replacement, total    Hypertension    Hypothyroidism    N&V (nausea and vomiting)    Peripheral neuropathy    Right foot no sensation   Sciatica    Sleep apnea    does not use CPAP   Thyroid disease    Wears glasses    Past Surgical History:  Procedure Laterality Date   ABDOMINAL HYSTERECTOMY     partial   APPENDECTOMY     BIOPSY  03/14/2018   Procedure: BIOPSY;  Surgeon: Charlott Rakes, MD;  Location: [facility] ENDOSCOPY;  Service: Endoscopy;;   BIOPSY  02/05/2023  Procedure: BIOPSY;  Surgeon: Charlott Rakes, MD;  Location: [facility] ENDOSCOPY;  Service: Gastroenterology;;   BREAST DUCTAL SYSTEM EXCISION Right 08/31/2017   Procedure: RIGHT BREAST CENTRAL DUCT EXCISION;  Surgeon: Harriette Bouillon, MD;  Location: [facility];  Service: General;  Laterality: Right;   BREAST EXCISIONAL BIOPSY Right    BREAST LUMPECTOMY     right   CARDIAC CATHETERIZATION     no significant CAD, nl LV function by 11/08/06 cath   CESAREAN SECTION     x4   COLONOSCOPY WITH PROPOFOL N/A 03/14/2018   Procedure: COLONOSCOPY WITH PROPOFOL;  Surgeon: Charlott Rakes, MD;  Location: [facility] ENDOSCOPY;  Service: Endoscopy;  Laterality: N/A;   COLONOSCOPY WITH PROPOFOL Left 02/05/2023    Procedure: COLONOSCOPY WITH PROPOFOL;  Surgeon: Charlott Rakes, MD;  Location: [facility] ENDOSCOPY;  Service: Gastroenterology;  Laterality: Left;   ESOPHAGOGASTRODUODENOSCOPY (EGD) WITH PROPOFOL Left 01/31/2023   Procedure: ESOPHAGOGASTRODUODENOSCOPY (EGD) WITH PROPOFOL;  Surgeon: Willis Modena, MD;  Location: [facility] ENDOSCOPY;  Service: Gastroenterology;  Laterality: Left;   GASTRIC ROUX-EN-Y N/A 11/25/2014   Procedure: LAPAROSCOPIC ROUX-EN-Y GASTRIC BYPASS WITH UPPER ENDOSCOPY;  Surgeon: Luretha Murphy, MD;  Location: [facility] ORS;  Service: General;  Laterality: N/A;   HERNIA REPAIR     left inguinal   JOINT REPLACEMENT     KNEE SURGERY     right knee   MOUTH SURGERY     MYOMECTOMY ABDOMINAL APPROACH  1989   POLYPECTOMY  03/14/2018   Procedure: POLYPECTOMY;  Surgeon: Charlott Rakes, MD;  Location: [facility] ENDOSCOPY;  Service: Endoscopy;;   TOTAL KNEE REVISION  06/21/2012   Procedure: TOTAL KNEE REVISION;  Surgeon: Nadara Mustard, MD;  Location: [facility] OR;  Service: Orthopedics;  Laterality: Right;  Revision Right Total Knee Arthroplasty     A IV Location/Drains/Wounds Patient Lines/Drains/Airways Status     Active Line/Drains/Airways     Name Placement date Placement time Site Days   Peripheral IV 03/22/23 22 G Left;Posterior Hand 03/22/23  0708  Hand  less than 1            Intake/Output Last 24 hours No intake or output data in the 24 hours ending 03/22/23 1359  Labs/Imaging Results for orders placed or performed during the hospital encounter of 03/21/23 (from the past 48 hour(s))  CBG monitoring, ED     Status: None   Collection Time: 03/21/23  4:43 PM  Result Value Ref Range   Glucose-Capillary 94 70 - 99 mg/dL    Comment: Glucose reference range applies only to samples taken after fasting for at least 8 hours.  Ethanol     Status: None   Collection Time: 03/21/23  5:45 PM  Result Value Ref Range   Alcohol, Ethyl (B) <10 <10 mg/dL    Comment: (NOTE) Lowest detectable limit for  serum alcohol is 10 mg/dL.  For medical purposes only. Performed at [facility], [address], [place], [place] [address]   Protime-INR     Status: None   Collection Time: 03/21/23  5:46 PM  Result Value Ref Range   Prothrombin Time 13.6 11.4 - 15.2 seconds   INR 1.0 0.8 - 1.2    Comment: (NOTE) INR goal varies based on device and disease states. Performed at [facility], [address], [place], [place] [address]   APTT     Status: None   Collection Time: 03/21/23  5:46 PM  Result Value Ref Range   aPTT 30 24 - 36 seconds  Comment: Performed at [facility], [address], [place], [place] [address]  CBC     Status: Abnormal   Collection Time: 03/21/23  5:46 PM  Result Value Ref Range   WBC 9.7 4.0 - 10.5 K/uL   RBC 3.95 3.87 - 5.11 MIL/uL   Hemoglobin 10.4 (L) 12.0 - 15.0 g/dL   HCT 60.4 (L) 54.0 - 98.1 %   MCV 85.8 80.0 - 100.0 fL   MCH 26.3 26.0 - 34.0 pg   MCHC 30.7 30.0 - 36.0 g/dL   RDW 19.1 (H) 47.8 - 29.5 %   Platelets 366 150 - 400 K/uL   nRBC 0.0 0.0 - 0.2 %    Comment: Performed at [facility], [address], [place], [place] [address]  Differential     Status: Abnormal   Collection Time: 03/21/23  5:46 PM  Result Value Ref Range   Neutrophils Relative % 81 %   Neutro Abs 7.8 (H) 1.7 - 7.7 K/uL   Lymphocytes Relative 13 %   Lymphs Abs 1.3 0.7 - 4.0 K/uL   Monocytes Relative 6 %   Monocytes Absolute 0.6 0.1 - 1.0 K/uL   Eosinophils Relative 0 %   Eosinophils Absolute 0.0 0.0 - 0.5 K/uL   Basophils Relative 0 %   Basophils Absolute 0.0 0.0 - 0.1 K/uL   Immature Granulocytes 0 %   Abs Immature Granulocytes 0.03 0.00 - 0.07 K/uL    Comment: Performed at [facility], [address], [place], [place] [address]  Comprehensive metabolic panel     Status: Abnormal   Collection Time: 03/21/23  5:46 PM  Result Value Ref Range   Sodium 138 135 - 145 mmol/L   Potassium 4.1 3.5 - 5.1 mmol/L   Chloride 104 98 - 111 mmol/L    CO2 21 (L) 22 - 32 mmol/L   Glucose, Bld 112 (H) 70 - 99 mg/dL    Comment: Glucose reference range applies only to samples taken after fasting for at least 8 hours.   BUN 19 8 - 23 mg/dL   Creatinine, Ser 4.69 (H) 0.44 - 1.00 mg/dL   Calcium 9.1 8.9 - 62.9 mg/dL   Total Protein 7.5 6.5 - 8.1 g/dL   Albumin 3.1 (L) 3.5 - 5.0 g/dL   AST 25 15 - 41 U/L   ALT 14 0 - 44 U/L   Alkaline Phosphatase 153 (H) 38 - 126 U/L   Total Bilirubin 1.1 0.3 - 1.2 mg/dL   GFR, Estimated 34 (L) >60 mL/min    Comment: (NOTE) Calculated using the CKD-EPI Creatinine Equation (2021)    Anion gap 13 5 - 15    Comment: Performed at [facility], [address] [place], [place] [address]  I-stat chem 8, ED     Status: Abnormal   Collection Time: 03/21/23  5:50 PM  Result Value Ref Range   Sodium 140 135 - 145 mmol/L   Potassium 4.1 3.5 - 5.1 mmol/L   Chloride 107 98 - 111 mmol/L   BUN 20 8 - 23 mg/dL   Creatinine, Ser 3.24 (H) 0.44 - 1.00 mg/dL   Glucose, Bld 401 (H) 70 - 99 mg/dL    Comment: Glucose reference range applies only to samples taken after fasting for at least 8 hours.   Calcium, Ion 1.14 (L) 1.15 - 1.40 mmol/L   TCO2 22 22 - 32 mmol/L   Hemoglobin 11.9 (L) 12.0 - 15.0 g/dL  HCT 35.0 (L) 36.0 - 46.0 %  Lipid panel     Status: Abnormal   Collection Time: 03/22/23  5:37 AM  Result Value Ref Range   Cholesterol 227 (H) 0 - 200 mg/dL   Triglycerides 83 <962 mg/dL   HDL 71 >95 mg/dL   Total CHOL/HDL Ratio 3.2 RATIO   VLDL 17 0 - 40 mg/dL   LDL Cholesterol 284 (H) 0 - 99 mg/dL    Comment:        Total Cholesterol/HDL:CHD Risk Coronary Heart Disease Risk Table                     Men   Women  1/2 Average Risk   3.4   3.3  Average Risk       5.0   4.4  2 X Average Risk   9.6   7.1  3 X Average Risk  23.4   11.0        Use the calculated Patient Ratio above and the CHD Risk Table to determine the patient's CHD Risk.        ATP III CLASSIFICATION (LDL):  <100     mg/dL   Optimal   132-440  mg/dL   Near or Above                    Optimal  130-159  mg/dL   Borderline  102-725  mg/dL   High  >366     mg/dL   Very High Performed at [facility], [address], [place], [place] [address]   Hemoglobin A1c     Status: None   Collection Time: 03/22/23  5:37 AM  Result Value Ref Range   Hgb A1c MFr Bld 5.3 4.8 - 5.6 %    Comment: (NOTE) Pre diabetes:          5.7%-6.4%  Diabetes:              >6.4%  Glycemic control for   <7.0% adults with diabetes    Mean Plasma Glucose 105.41 mg/dL    Comment: Performed at [facility], [address], [place], [place] [address]  CBG monitoring, ED     Status: Abnormal   Collection Time: 03/22/23  7:13 AM  Result Value Ref Range   Glucose-Capillary 101 (H) 70 - 99 mg/dL    Comment: Glucose reference range applies only to samples taken after fasting for at least 8 hours.  CBG monitoring, ED     Status: None   Collection Time: 03/22/23  8:23 AM  Result Value Ref Range   Glucose-Capillary 96 70 - 99 mg/dL    Comment: Glucose reference range applies only to samples taken after fasting for at least 8 hours.  TSH     Status: Abnormal   Collection Time: 03/22/23 12:11 PM  Result Value Ref Range   TSH 19.119 (H) 0.350 - 4.500 uIU/mL    Comment: Performed by a 3rd Generation assay with a functional sensitivity of <=0.01 uIU/mL. Performed at [facility], [address] [place], [place] [address]   Comprehensive metabolic panel     Status: Abnormal   Collection Time: 03/22/23 12:11 PM  Result Value Ref Range   Sodium 139 135 - 145 mmol/L   Potassium 3.0 (L) 3.5 - 5.1 mmol/L   Chloride 106 98 - 111 mmol/L   CO2 22 22 - 32 mmol/L   Glucose, Bld 117 (H) 70 -  99 mg/dL    Comment: Glucose reference range applies only to samples taken after fasting for at least 8 hours.   BUN 17 8 - 23 mg/dL   Creatinine, Ser 4.09 (H) 0.44 - 1.00 mg/dL   Calcium 8.3 (L) 8.9 - 10.3 mg/dL   Total Protein 6.6 6.5 - 8.1 g/dL    Albumin 2.7 (L) 3.5 - 5.0 g/dL   AST 23 15 - 41 U/L   ALT 10 0 - 44 U/L   Alkaline Phosphatase 137 (H) 38 - 126 U/L   Total Bilirubin 0.8 0.3 - 1.2 mg/dL   GFR, Estimated 37 (L) >60 mL/min    Comment: (NOTE) Calculated using the CKD-EPI Creatinine Equation (2021)    Anion gap 11 5 - 15    Comment: Performed at [facility], [address] [place], [place] [address]  Magnesium     Status: None   Collection Time: 03/22/23 12:11 PM  Result Value Ref Range   Magnesium 1.9 1.7 - 2.4 mg/dL    Comment: Performed at [facility], [address] [place], [place] [address]  CBC with Differential/Platelet     Status: Abnormal   Collection Time: 03/22/23 12:11 PM  Result Value Ref Range   WBC 9.3 4.0 - 10.5 K/uL   RBC 3.53 (L) 3.87 - 5.11 MIL/uL   Hemoglobin 9.3 (L) 12.0 - 15.0 g/dL   HCT 56.2 (L) 13.0 - 86.5 %   MCV 85.3 80.0 - 100.0 fL   MCH 26.3 26.0 - 34.0 pg   MCHC 30.9 30.0 - 36.0 g/dL   RDW 78.4 (H) 69.6 - 29.5 %   Platelets 312 150 - 400 K/uL   nRBC 0.0 0.0 - 0.2 %   Neutrophils Relative % 83 %   Neutro Abs 7.7 1.7 - 7.7 K/uL   Lymphocytes Relative 10 %   Lymphs Abs 1.0 0.7 - 4.0 K/uL   Monocytes Relative 6 %   Monocytes Absolute 0.5 0.1 - 1.0 K/uL   Eosinophils Relative 0 %   Eosinophils Absolute 0.0 0.0 - 0.5 K/uL   Basophils Relative 1 %   Basophils Absolute 0.1 0.0 - 0.1 K/uL   Immature Granulocytes 0 %   Abs Immature Granulocytes 0.03 0.00 - 0.07 K/uL    Comment: Performed at [facility], [address] [place], [place] [address]  Lipid panel     Status: Abnormal   Collection Time: 03/22/23 12:11 PM  Result Value Ref Range   Cholesterol 204 (H) 0 - 200 mg/dL   Triglycerides 79 <244 mg/dL   HDL 67 >01 mg/dL   Total CHOL/HDL Ratio 3.0 RATIO   VLDL 16 0 - 40 mg/dL   LDL Cholesterol 027 (H) 0 - 99 mg/dL    Comment:        Total Cholesterol/HDL:CHD Risk Coronary Heart Disease Risk Table                     Men   Women  1/2 Average Risk   3.4    3.3  Average Risk       5.0   4.4  2 X Average Risk   9.6   7.1  3 X Average Risk  23.4   11.0        Use the calculated Patient Ratio above and the CHD Risk Table to determine the patient's CHD Risk.        ATP III CLASSIFICATION (LDL):  <100  mg/dL   Optimal  161-096  mg/dL   Near or Above                    Optimal  130-159  mg/dL   Borderline  045-409  mg/dL   High  >811     mg/dL   Very High Performed at [facility], [address], [place], [place] [address]   Hemoglobin A1c     Status: None   Collection Time: 03/22/23 12:11 PM  Result Value Ref Range   Hgb A1c MFr Bld 5.3 4.8 - 5.6 %    Comment: (NOTE) Pre diabetes:          5.7%-6.4%  Diabetes:              >6.4%  Glycemic control for   <7.0% adults with diabetes    Mean Plasma Glucose 105.41 mg/dL    Comment: Performed at [facility], [address] [place], [place] [address]   MR Brain Wo Contrast (neuro protocol)  Result Date: 03/22/2023 CLINICAL DATA:  Initial evaluation for acute aphasia. EXAM: MRI HEAD WITHOUT CONTRAST MRA HEAD WITHOUT CONTRAST TECHNIQUE: Multiplanar, multi-echo pulse sequences of the brain and surrounding structures were acquired without intravenous contrast. Angiographic images of the Circle of Willis were acquired using MRA technique without intravenous contrast. COMPARISON:  Prior CT from earlier the same day. FINDINGS: MRI HEAD FINDINGS Brain: Cerebral volume within normal limits. Chronic right frontal infarct with associated chronic hemosiderin staining. Patchy restricted diffusion involving the cortical left frontal, parietal, and temporal lobes, consistent with an acute left MCA distribution infarct. Minimal patchy subcortical involvement about the left basal ganglia. 9 mm acute ischemic infarct involving the contralateral right frontal centrum semi ovale (series 2, image 38). Few additional scattered small volume bilateral cerebellar infarcts, left larger than right. No  associated hemorrhage or significant mass effect. A central thromboembolic etiology is suspected. No mass lesion or midline shift. No hydrocephalus or extra-axial fluid collection. Empty sella noted. Vascular: Major intracranial vascular flow voids are maintained. Skull and upper cervical spine: Craniocervical junction within normal limits. Bone marrow signal intensity normal. No scalp soft tissue abnormality. Sinuses/Orbits: Globes orbital soft tissues within normal limits. Paranasal sinuses are largely clear. Trace left mastoid effusion, of doubtful significance. Other: None. MRA HEAD FINDINGS Anterior circulation: Examination degraded by motion artifact. Both internal carotid arteries are patent to the termini without significant stenosis. A1 segments patent bilaterally. Grossly normal anterior communicating artery complex. Partially visualized ACAs grossly patent to their distal aspects without visible stenosis. No visible M1 stenosis or occlusion. No visible proximal MCA branch occlusion or high-grade stenosis. Distal MCA branches not well assessed on this motion degraded exam. Posterior circulation: Right vertebral artery dominant and widely patent without stenosis. Right PICA patent. Left vertebral artery hypoplastic and largely terminates in PICA. Left PICA patent as well. Basilar patent without stenosis. Superior cerebellar arteries not well seen on this motion degraded exam. Both PCAs appear to be primarily supplied via the basilar and are grossly patent to their distal aspects without visible stenosis. Anatomic variants: As above.  No visible aneurysm. IMPRESSION: MRI HEAD IMPRESSION: 1. Patchy acute left MCA distribution infarct involving the left frontal, parietal, and temporal lobes. No associated hemorrhage or significant mass effect. 2. Additional small volume acute ischemic nonhemorrhagic infarcts involving the contralateral right frontal centrum semi ovale and left greater than right cerebellum.  Given the various vascular distributions involved, a central thromboembolic etiology is suspected.  3. Chronic right frontal infarct. MRA HEAD IMPRESSION: 1. Motion degraded exam. 2. Grossly negative intracranial MRA for large vessel occlusion. No visible hemodynamically significant or correctable stenosis. Electronically Signed   By: Rise Mu M.D.   On: 03/22/2023 00:41   MR ANGIO HEAD WO CONTRAST  Result Date: 03/22/2023 CLINICAL DATA:  Initial evaluation for acute aphasia. EXAM: MRI HEAD WITHOUT CONTRAST MRA HEAD WITHOUT CONTRAST TECHNIQUE: Multiplanar, multi-echo pulse sequences of the brain and surrounding structures were acquired without intravenous contrast. Angiographic images of the Circle of Willis were acquired using MRA technique without intravenous contrast. COMPARISON:  Prior CT from earlier the same day. FINDINGS: MRI HEAD FINDINGS Brain: Cerebral volume within normal limits. Chronic right frontal infarct with associated chronic hemosiderin staining. Patchy restricted diffusion involving the cortical left frontal, parietal, and temporal lobes, consistent with an acute left MCA distribution infarct. Minimal patchy subcortical involvement about the left basal ganglia. 9 mm acute ischemic infarct involving the contralateral right frontal centrum semi ovale (series 2, image 38). Few additional scattered small volume bilateral cerebellar infarcts, left larger than right. No associated hemorrhage or significant mass effect. A central thromboembolic etiology is suspected. No mass lesion or midline shift. No hydrocephalus or extra-axial fluid collection. Empty sella noted. Vascular: Major intracranial vascular flow voids are maintained. Skull and upper cervical spine: Craniocervical junction within normal limits. Bone marrow signal intensity normal. No scalp soft tissue abnormality. Sinuses/Orbits: Globes orbital soft tissues within normal limits. Paranasal sinuses are largely clear. Trace left  mastoid effusion, of doubtful significance. Other: None. MRA HEAD FINDINGS Anterior circulation: Examination degraded by motion artifact. Both internal carotid arteries are patent to the termini without significant stenosis. A1 segments patent bilaterally. Grossly normal anterior communicating artery complex. Partially visualized ACAs grossly patent to their distal aspects without visible stenosis. No visible M1 stenosis or occlusion. No visible proximal MCA branch occlusion or high-grade stenosis. Distal MCA branches not well assessed on this motion degraded exam. Posterior circulation: Right vertebral artery dominant and widely patent without stenosis. Right PICA patent. Left vertebral artery hypoplastic and largely terminates in PICA. Left PICA patent as well. Basilar patent without stenosis. Superior cerebellar arteries not well seen on this motion degraded exam. Both PCAs appear to be primarily supplied via the basilar and are grossly patent to their distal aspects without visible stenosis. Anatomic variants: As above.  No visible aneurysm. IMPRESSION: MRI HEAD IMPRESSION: 1. Patchy acute left MCA distribution infarct involving the left frontal, parietal, and temporal lobes. No associated hemorrhage or significant mass effect. 2. Additional small volume acute ischemic nonhemorrhagic infarcts involving the contralateral right frontal centrum semi ovale and left greater than right cerebellum. Given the various vascular distributions involved, a central thromboembolic etiology is suspected. 3. Chronic right frontal infarct. MRA HEAD IMPRESSION: 1. Motion degraded exam. 2. Grossly negative intracranial MRA for large vessel occlusion. No visible hemodynamically significant or correctable stenosis. Electronically Signed   By: Rise Mu M.D.   On: 03/22/2023 00:41   DG CHEST PORT 1 VIEW  Result Date: 03/21/2023 CLINICAL DATA:  Altered mental status.  MRI clearance. EXAM: PORTABLE CHEST 1 VIEW  COMPARISON:  Chest radiograph 01/30/2023 FINDINGS: No implanted medical device or radiopaque foreign body to preclude MRI imaging. Cardiomegaly is stable. Minor atelectasis at the left lung base. No confluent airspace disease, large pleural effusion or pneumothorax. IMPRESSION: 1. No implanted medical device or radiopaque foreign body to preclude MRI imaging. 2. Stable cardiomegaly. Minor left basilar atelectasis. Electronically Signed   By: Shawna Orleans  Sanford M.D.   On: 03/21/2023 21:54   DG Abd 1 View  Result Date: 03/21/2023 CLINICAL DATA:  Altered mental status.  MRI clearance. EXAM: ABDOMEN - 1 VIEW COMPARISON:  CT 02/05/2023 FINDINGS: Lumbosacral fusion hardware. Enteric sutures in the left upper quadrant. No radiopaque foreign body or implanted medical device to preclude MRI imaging. Nonobstructive bowel gas pattern. Chronic changes about the sacroiliac joints and pubic symphysis. IMPRESSION: 1. No radiopaque foreign body or implanted medical device to preclude MRI imaging. 2. Enteric sutures in the left upper quadrant. Lumbosacral fusion hardware. Electronically Signed   By: Narda Rutherford M.D.   On: 03/21/2023 21:53   CT HEAD WO CONTRAST  Result Date: 03/21/2023 CLINICAL DATA:  Neurological deficit. EXAM: CT HEAD WITHOUT CONTRAST TECHNIQUE: Contiguous axial images were obtained from the base of the skull through the vertex without intravenous contrast. RADIATION DOSE REDUCTION: This exam was performed according to the departmental dose-optimization program which includes automated exposure control, adjustment of the mA and/or kV according to patient size and/or use of iterative reconstruction technique. COMPARISON:  None Available. FINDINGS: Brain: There is mild cerebral atrophy with widening of the extra-axial spaces and ventricular dilatation. There are areas of decreased attenuation within the white matter tracts of the supratentorial brain, consistent with microvascular disease changes. Chronic  right frontal lobe and left posterior parietal infarcts are seen. Vascular: No hyperdense vessel or unexpected calcification. Skull: Normal. Negative for fracture or focal lesion. Sinuses/Orbits: No acute finding. Other: None. IMPRESSION: 1. Generalized cerebral atrophy with chronic white matter small vessel ischemic changes. 2. Chronic right frontal lobe and left posterior parietal infarcts. 3. No acute intracranial abnormality. Electronically Signed   By: Aram Candela M.D.   On: 03/21/2023 17:49    Pending Labs Unresulted Labs (From admission, onward)     Start     Ordered   03/23/23 0500  Basic metabolic panel  Tomorrow morning,   R        03/22/23 1326   03/23/23 0500  CBC  Tomorrow morning,   R        03/22/23 1326   03/23/23 0500  Magnesium  Tomorrow morning,   R        03/22/23 1326            Vitals/Pain Today's Vitals   03/22/23 0730 03/22/23 0733 03/22/23 0759 03/22/23 1211  BP: (!) 154/110  (!) 142/88   Pulse:  (!) 105 (!) 108   Resp: (!) 27 20 (!) 21   Temp:   97.8 F (36.6 C) 97.8 F (36.6 C)  TempSrc:   Oral Oral  SpO2:  97% 98%   PainSc:        Isolation Precautions No active isolations  Medications Medications  sodium chloride flush (NS) 0.9 % injection 3 mL (3 mLs Intravenous Not Given 03/21/23 2241)  morphine (MS CONTIN) 12 hr tablet 15 mg (has no administration in time range)  ALPRAZolam (XANAX) tablet 1 mg (has no administration in time range)  doxepin (SINEQUAN) capsule 75 mg (has no administration in time range)  brexpiprazole (REXULTI) tablet 2 mg (2 mg Oral Given 03/22/23 1213)  vortioxetine HBr (TRINTELLIX) tablet 20 mg (20 mg Oral Given 03/22/23 1213)  dapagliflozin propanediol (FARXIGA) tablet 5 mg (5 mg Oral Given 03/22/23 1212)  levothyroxine (SYNTHROID) tablet 150 mcg (150 mcg Oral Given 03/22/23 0709)  pantoprazole (PROTONIX) EC tablet 40 mg (40 mg Oral Given 03/22/23 1212)  fesoterodine (TOVIAZ) tablet 4 mg (4 mg Oral Given  03/22/23 1213)   rOPINIRole (REQUIP) tablet 2 mg (has no administration in time range)  albuterol (PROVENTIL) (2.5 MG/3ML) 0.083% nebulizer solution 3 mL (has no administration in time range)  umeclidinium-vilanterol (ANORO ELLIPTA) 62.5-25 MCG/ACT 1 puff (1 puff Inhalation Given 03/22/23 0733)  heparin injection 5,000 Units (5,000 Units Subcutaneous Given 03/22/23 0709)  0.9 %  sodium chloride infusion ( Intravenous New Bag/Given 03/22/23 0709)  acetaminophen (TYLENOL) tablet 650 mg (has no administration in time range)    Or  acetaminophen (TYLENOL) suppository 650 mg (has no administration in time range)  oxyCODONE (Oxy IR/ROXICODONE) immediate release tablet 5 mg (has no administration in time range)  ondansetron (ZOFRAN) tablet 4 mg (has no administration in time range)    Or  ondansetron (ZOFRAN) injection 4 mg (has no administration in time range)  clopidogrel (PLAVIX) tablet 75 mg (75 mg Oral Given 03/22/23 1212)  cilostazol (PLETAL) tablet 50 mg (50 mg Oral Not Given 03/22/23 1312)  rosuvastatin (CRESTOR) tablet 40 mg (40 mg Oral Given 03/22/23 1212)  potassium chloride 10 mEq in 100 mL IVPB (has no administration in time range)  perflutren lipid microspheres (DEFINITY) IV suspension (6 mLs Intravenous Given 03/22/23 1344)   stroke: early stages of recovery book ( Does not apply Given 03/22/23 1211)    Mobility non-ambulatory     Focused Assessments Neuro Assessment Handoff:  Swallow screen pass? Yes  Cardiac Rhythm: Sinus tachycardia NIH Stroke Scale  Dizziness Present: Yes Headache Present: No Interval: Shift assessment Level of Consciousness (1a.)   : Alert, keenly responsive LOC Questions (1b. )   : Answers neither question correctly LOC Commands (1c. )   : Performs both tasks correctly Best Gaze (2. )  : Normal Visual (3. )  : No visual loss Facial Palsy (4. )    : Normal symmetrical movements Motor Arm, Left (5a. )   : No drift Motor Arm, Right (5b. ) : No drift Motor Leg, Left (6a.  )  : No effort against gravity Motor Leg, Right (6b. ) : No effort against gravity Limb Ataxia (7. ): Present in one limb Sensory (8. )  : Normal, no sensory loss Best Language (9. )  : Severe aphasia Dysarthria (10. ): Mild-to-moderate dysarthria, patient slurs at least some words and, at worst, can be understood with some difficulty Extinction/Inattention (11.)   : No Abnormality Complete NIHSS TOTAL: 12     Neuro Assessment: Exceptions to WDL Neuro Checks:   Initial (03/21/23 1620)  Has TPA been given? No If patient is a Neuro Trauma and patient is going to OR before floor call report to 4N Charge nurse: [phone] or [phone]   R Recommendations: See Admitting Provider Note  Report given to:   Additional Notes:

## 2023-03-22 NOTE — Evaluation (Signed)
Speech Language Pathology Evaluation Patient Details Name: Caroline Williams MRN: [id number] DOB: [birth year] Today's Date: 03/22/2023 Time: 0160-1093 SLP Time Calculation (min) (ACUTE ONLY): 20 min  Problem List:  Patient Active Problem List   Diagnosis Date Noted   GERD (gastroesophageal reflux disease) 03/22/2023   Psychiatric disorder 03/22/2023   CVA (cerebral vascular accident) (HCC) 03/21/2023   Acute ischemic colitis (HCC) 02/05/2023   Acute GI bleeding 02/01/2023   UTI (urinary tract infection) 01/31/2023   CKD (chronic kidney disease) stage 3, GFR 30-59 ml/min (HCC) 01/31/2023   AKI (acute kidney injury) (HCC) 01/31/2023   Acute metabolic encephalopathy 01/31/2023   ABLA (acute blood loss anemia) 01/31/2023   Gastrointestinal hemorrhage with melena 01/31/2023   Hypokalemia 01/31/2023   Spondylolisthesis, lumbar region 01/12/2023   PVC's (premature ventricular contractions) 12/03/2022   CAD (coronary artery disease) 12/03/2022   Preoperative cardiovascular examination 12/02/2022   Bipolar 1 disorder (HCC)    Degeneration of lumbar intervertebral disc 08/07/2020   Essential (primary) hypertension 05/07/2020   Other chronic pain 02/14/2020   Elevated blood-pressure reading, without diagnosis of hypertension 02/13/2020   Hyperlipidemia 09/17/2019   COPD mixed type (HCC) 04/17/2019   Tobacco user 04/17/2019   Nocturnal hypoxemia 12/25/2018   Muscle weakness 12/05/2018   Change in bowel habits 03/14/2018   Exertional chest pain 01/26/2016   Hallucination, drug-induced (HCC) 03/12/2015   Hypothyroidism 03/12/2015   Chronic low back pain 01/29/2015   Lumbar spondylosis 01/29/2015   History of Roux-en-Y gastric bypass 2016 11/25/2014   Diabetes mellitus with neuropathy (HCC) 06/20/2013   Chronic pain of right knee 06/20/2013   Morbid obesity (HCC) 07/05/2012   Past Medical History:  Past Medical History:  Diagnosis Date   Anxiety    Arthritis    Asthma    Bipolar 1  disorder (HCC)    Breast discharge    Breast lump    Breast pain    CAD (coronary artery disease) 12/03/2022    o Cath 2008: no CAD   o CCTA 10/05/19: CAC score 49, 88th percentile, nonobstructive CAD (LM 1-24 mid and dist)  o Myoview 12/31/20: EF 45, no ischemia or infarction, low risk    Chronic pain    on MS Contin and oxycodone   COPD (chronic obstructive pulmonary disease) (HCC)    Depression    Diabetes mellitus    diet and exercise controlled   Dysrhythmia    HR in 30s on metoprolol   Escherichia coli (E. coli) infection    Fever    GERD (gastroesophageal reflux disease)    History of chest pain    History of knee replacement, total    Hypertension    Hypothyroidism    N&V (nausea and vomiting)    Peripheral neuropathy    Right foot no sensation   Sciatica    Sleep apnea    does not use CPAP   Thyroid disease    Wears glasses    Past Surgical History:  Past Surgical History:  Procedure Laterality Date   ABDOMINAL HYSTERECTOMY     partial   APPENDECTOMY     BIOPSY  03/14/2018   Procedure: BIOPSY;  Surgeon: Charlott Rakes, MD;  Location: [facility] END[facility];  Service: Endoscopy;;   BIOPSY  02/05/2023   Procedure: BIOPSY;  Surgeon: Charlott Rakes, MD;  Location: [facility] ENDOSCOPY;  Service: Gastroenterology;;   BREAST DUCTAL SYSTEM EXCISION Right 08/31/2017   Procedure: RIGHT BREAST CENTRAL DUCT EXCISION;  Surgeon: Harriette Bouillon, MD;  Location: [facility];  Service: General;  Laterality: Right;   BREAST EXCISIONAL BIOPSY Right    BREAST LUMPECTOMY     right   CARDIAC CATHETERIZATION     no significant CAD, nl LV function by 11/08/06 cath   CESAREAN SECTION     x4   COLONOSCOPY WITH PROPOFOL N/A 03/14/2018   Procedure: COLONOSCOPY WITH PROPOFOL;  Surgeon: Charlott Rakes, MD;  Location: [facility];  Service: Endoscopy;  Laterality: N/A;   COLONOSCOPY WITH PROPOFOL Left 02/05/2023   Procedure: COLONOSCOPY WITH PROPOFOL;  Surgeon: Charlott Rakes,  MD;  Location: [facility] ENDOSCOPY;  Service: Gastroenterology;  Laterality: Left;   ESOPHAGOGASTRODUODENOSCOPY (EGD) WITH PROPOFOL Left 01/31/2023   Procedure: ESOPHAGOGASTRODUODENOSCOPY (EGD) WITH PROPOFOL;  Surgeon: Willis Modena, MD;  Location: [facility] ENDOSCOPY;  Service: Gastroenterology;  Laterality: Left;   GASTRIC ROUX-EN-Y N/A 11/25/2014   Procedure: LAPAROSCOPIC ROUX-EN-Y GASTRIC BYPASS WITH UPPER ENDOSCOPY;  Surgeon: Luretha Murphy, MD;  Location: [facility] ORS;  Service: General;  Laterality: N/A;   HERNIA REPAIR     left inguinal   JOINT REPLACEMENT     KNEE SURGERY     right knee   MOUTH SURGERY     MYOMECTOMY ABDOMINAL APPROACH  1989   POLYPECTOMY  03/14/2018   Procedure: POLYPECTOMY;  Surgeon: Charlott Rakes, MD;  Location: [facility] ENDOS[facility];  Service: Endoscopy;;   TOTAL KNEE REVISION  06/21/2012   Procedure: TOTAL KNEE REVISION;  Surgeon: Nadara Mustard, MD;  Location: [facility] OR;  Service: Orthopedics;  Laterality: Right;  Revision Right Total Knee Arthroplasty   HPI:  Caroline BIRCHETT is a 62 y.o. female who presented the ED with a chief complaint of aphasia. MRI 10/8: "Patchy acute left MCA distribution infarct involving the left  frontal, parietal, and temporal lobes. No associated hemorrhage or significant mass effect." CXR 10/7: " Minor atelectasis at the left lung base. No confluent airspace disease, large pleural effusion or pneumothorax."  Pt with medical history significant of anxiety, bipolar 1 disorder, coronary artery disease, COPD, depression, diabetes mellitus type 2, history of Roux-en-Y, GERD, hypothyroidism, hypertension, neuropathy, and more.   Assessment / Plan / Recommendation Clinical Impression  Pt presents with moderate expressive and receptive language deficits.  Pt was assessed using the Western Aphasia Battery - Bedside Form with a bedside aphasia score of 23. Her spontaneous speech is effortful, but largely grammatical but devoid of meaninful content.  Some phonemic and  semantic paraphasias noted.  When pt's responses were appropriate to questioning the were halting and exhibited less grammatical content "A fish"  "He's sleep." She answered biographical yes no questions with 100% accuracy, immediate environment questions with 67% accuracy, and comparison questions with 0% accuracy.  Pt was unable to complete repetition task.  She was unable to complete sequental commands as part of WAB, but followed 1 step commands for body movement with 40% accuracy, improving to 80% with visual model.  She completed confrontational naming task with 50% accuracy.  Perseveration noted throughout evaluation.  Pt attempted to write. She wroter her name as "NadgE."   Pt with intermittent awarenss of errors and does become frustrated with difficulty communicating.  Boyfriend at bedside has been helpful in determining pt intent. Pt will need onging ST intervetion in house and would benefit from intense ST after discharge as well. Consider IPR.    SLP Assessment  SLP Recommendation/Assessment: Patient needs continued Speech Lanaguage Pathology Services SLP Visit Diagnosis: Aphasia (R47.01)    Recommendations for follow up therapy are one component of a multi-disciplinary discharge  planning process, led by the attending physician.  Recommendations may be updated based on patient status, additional functional criteria and insurance authorization.    Follow Up Recommendations  Acute inpatient rehab (3hours/day)    Assistance Recommended at Discharge  Frequent or constant Supervision/Assistance  Functional Status Assessment Patient has had a recent decline in their functional status and demonstrates the ability to make significant improvements in function in a reasonable and predictable amount of time.  Frequency and Duration min 2x/week  2 weeks      SLP Evaluation Cognition  Overall Cognitive Status: Difficult to assess       Comprehension  Auditory Comprehension Overall Auditory  Comprehension: Impaired Yes/No Questions: Impaired Basic Biographical Questions: 76-100% accurate Basic Immediate Environment Questions: 50-74% accurate Complex Questions: 0-24% accurate Commands: Impaired One Step Basic Commands: 25-49% accurate Two Step Basic Commands: 0-24% accurate Visual Recognition/Discrimination Discrimination: Not tested Reading Comprehension Reading Status: Not tested    Expression Expression Primary Mode of Expression: Verbal Verbal Expression Overall Verbal Expression: Impaired Level of Generative/Spontaneous Verbalization: Word;Phrase Repetition: Impaired Level of Impairment: Word level Naming: Impairment Confrontation: Impaired Verbal Errors: Semantic paraphasias;Phonemic paraphasias;Perseveration Written Expression Dominant Hand: Right   Oral / Motor  Oral Motor/Sensory Function Overall Oral Motor/Sensory Function: Within functional limits Facial ROM: Reduced right Facial Symmetry: Abnormal symmetry right Lingual ROM: Within Functional Limits Lingual Symmetry: Within Functional Limits Velum:  (Could not test) Mandible: Within Functional Limits            Kerrie Pleasure, MA, CCC-SLP Acute Rehabilitation Services Office: [phone] 03/22/2023, 11:36 AM

## 2023-03-22 NOTE — Assessment & Plan Note (Signed)
-   Evaluated by neurology and found to have a left MCA stroke with concern for right hemisphere involvement as well and multiple vascular territories concerning for possible central embolic stroke Patient was also found to have submassive bilateral PE, concern of cor pulmonale with right ventricular strain, a large and transient clot between atrium. She was started on heparin infusion. Neurology to decide about antiplatelets Continue with Crestor PT and OT are recommending SNF

## 2023-03-22 NOTE — Evaluation (Signed)
Physical Therapy Evaluation Patient Details Name: Caroline Williams MRN: [id number] DOB: [birth year] Today's Date: 03/22/2023  History of Present Illness  Pt is a 62 y/o female presenting to [facility] hospital on 03/21/2023 with aphasia. MRI demonstrates bilateral acute ischemic infarcts, suggestive of cardioembolic source. Pt was recently discharged 10/3 after management of ischemic colitis. PMH includes: L4-5 PLIF 7/31, anxiety, arthritis, bipolar 1, CAD, COPD, depression, DM, HTN, hernia repair, R TKR.  Clinical Impression  Pt presents to PT with deficits in functional mobility, strength, power, communication, cognition. Evaluation is limited by pt's impairments in communication and cognition as she is unable to consistently follow commands at this time. Pt appears to have pain with attempts at rolling during session but is unable to indicate where. Pt does appear to refuse further mobility attempts as she clearly states "I can't do this now". PT will attempt to follow up for further treatment at a later time. PT anticipates the need for short term inpatient PT services at the time of discharge.        If plan is discharge home, recommend the following: Two people to help with walking and/or transfers;Two people to help with bathing/dressing/bathroom;Assistance with cooking/housework;Assistance with feeding;Direct supervision/assist for medications management;Direct supervision/assist for financial management;Assist for transportation;Help with stairs or ramp for entrance;Supervision due to cognitive status   Can travel by private vehicle   No    Equipment Recommendations Hospital bed;Hoyer lift;Wheelchair (measurements PT)  Recommendations for Other Services       Functional Status Assessment Patient has had a recent decline in their functional status and demonstrates the ability to make significant improvements in function in a reasonable and predictable amount of time.     Precautions /  Restrictions Precautions Precautions: Fall Precaution Comments: Watch BP Restrictions Weight Bearing Restrictions: No      Mobility  Bed Mobility Overal bed mobility: Needs Assistance Bed Mobility: Rolling Rolling: Max assist         General bed mobility comments: pt requries maxA to roll, does not tolerate well and quickly rolls back to supine with 2 attempts. Pt may be exepriencing back pain as she moans with attempts at rolling    Transfers Overall transfer level:  (deferred due to presumed pain with back pain, pt appear to refuse further mobility attempts)                      Ambulation/Gait                  Stairs            Wheelchair Mobility     Tilt Bed    Modified Rankin (Stroke Patients Only)       Balance                                             Pertinent Vitals/Pain Pain Assessment Pain Assessment: Faces Faces Pain Scale: Hurts even more Pain Location: unsure, pt cannot indicate Pain Descriptors / Indicators: Moaning Pain Intervention(s): Limited activity within patient's tolerance    Home Living Family/patient expects to be discharged to:: Private residence Living Arrangements: Spouse/significant other Available Help at Discharge: Family;Available 24 hours/day Type of Home: House Home Access: Stairs to enter   [employer] of Steps: 3   Home Layout: One level Home Equipment: Cane - single point;Tub bench;Rolling Walker (2 wheels) Additional  Comments: history obtained from prior admission. Pt has significant expressive aphasia and often only answers yes to questions, including when asked if the room is on fire    Prior Function Prior Level of Function : Independent/Modified Independent             Mobility Comments: ambulatory with RW recently per most recent PT note in August, pt is unable to provide a reliable history due to aphasia ADLs Comments: per last admission:  independent, light IADls; not driving; no longer cooks or cleans     Extremity/Trunk Assessment   Upper Extremity Assessment Upper Extremity Assessment: Defer to OT evaluation    Lower Extremity Assessment Lower Extremity Assessment: Difficult to assess due to impaired cognition (pt with at least 4-/5 strength in LLE based on observation, RLE appears weaker than LLE but unable to formally assess 2/2 cognitive and comunication deficits)    Cervical / Trunk Assessment Cervical / Trunk Assessment: Kyphotic  Communication   Communication Communication: Difficulty following commands/understanding;Difficulty communicating thoughts/reduced clarity of speech Following commands: Follows one step commands inconsistently;Follows multi-step commands inconsistently Cueing Techniques: Verbal cues;Tactile cues;Visual cues  Cognition Arousal: Alert Behavior During Therapy: WFL for tasks assessed/performed Overall Cognitive Status: Difficult to assess                                 General Comments: pt with expressive aphasia at this time, inconsistently follows motor commands        General Comments General comments (skin integrity, edema, etc.): VSS on RA    Exercises     Assessment/Plan    PT Assessment Patient needs continued PT services  PT Problem List Decreased strength;Decreased activity tolerance;Decreased balance;Decreased mobility;Decreased coordination;Decreased cognition;Decreased knowledge of use of DME;Decreased safety awareness;Decreased knowledge of precautions;Impaired sensation;Pain       PT Treatment Interventions DME instruction;Gait training;Stair training;Functional mobility training;Balance training;Therapeutic activities;Therapeutic exercise;Neuromuscular re-education;Cognitive remediation;Patient/family education;Wheelchair mobility training    PT Goals (Current goals can be found in the Care Plan section)  Acute Rehab PT Goals Patient Stated  Goal: pt is unable to state, PT goal to progress to out of bed transfers with minA or less to reduce caregiver burden PT Goal Formulation: Patient unable to participate in goal setting Time For Goal Achievement: 04/05/23 Potential to Achieve Goals: Fair    Frequency Min 1X/week     Co-evaluation               AM-PAC PT "6 Clicks" Mobility  Outcome Measure Help needed turning from your back to your side while in a flat bed without using bedrails?: A Lot Help needed moving from lying on your back to sitting on the side of a flat bed without using bedrails?: Total Help needed moving to and from a bed to a chair (including a wheelchair)?: Total Help needed standing up from a chair using your arms (e.g., wheelchair or bedside chair)?: Total Help needed to walk in hospital room?: Total Help needed climbing 3-5 steps with a railing? : Total 6 Click Score: 7    End of Session   Activity Tolerance: Patient limited by pain Patient left: in bed;with call bell/phone within reach;with bed alarm set Nurse Communication: Mobility status PT Visit Diagnosis: Other abnormalities of gait and mobility (R26.89);Muscle weakness (generalized) (M62.81);Other symptoms and signs involving the nervous system (R29.898)    Time: 9528-4132 PT Time Calculation (min) (ACUTE ONLY): 11 min   Charges:   PT Evaluation $  PT Eval Moderate Complexity: 1 Mod   PT General Charges $$ ACUTE PT VISIT: 1 Visit         Arlyss Gandy, PT, DPT Acute Rehabilitation Office [phone]   Arlyss Gandy 03/22/2023, 5:34 PM

## 2023-03-22 NOTE — Assessment & Plan Note (Signed)
-   Continue Anoro and albuterol

## 2023-03-22 NOTE — Progress Notes (Addendum)
Carotid arterial duplex completed. Please see CV Procedures for preliminary results.  Shona Simpson, RVT 03/22/23 4:44 PM

## 2023-03-22 NOTE — Assessment & Plan Note (Signed)
Lipid panel pending.

## 2023-03-22 NOTE — Progress Notes (Signed)
Echocardiogram 2D Echocardiogram has been performed.  Caroline Williams 03/22/2023, 1:44 PM

## 2023-03-22 NOTE — Progress Notes (Addendum)
PROGRESS NOTE    Caroline Williams  ZOX:[id number] DOB: November 14, 1960 DOA: 03/21/2023 PCP: Hoy Register, MD    Brief Narrative:   Caroline Williams is a 62 y.o. female with medical history significant of anxiety, bipolar 1 disorder, coronary artery disease, COPD, depression, diabetes mellitus type 2, history of Roux-en-Y, GERD, hypothyroidism, hypertension, neuropathy, presented to the hospital with aphasia.  At baseline patient does not drive or cook and is able to manage her own medication.  Patient was recently admitted hospital for melena and a hemoglobin of 3.0 and was noted to have ischemic colitis.  At that time, general surgery was consulted and patient was treated conservatively with bowel rest, liquid diet, IV Zosyn.  Apparently family does not think she is returned to her baseline since then.  On this presentation, patient was unable to express and had some pain in the right foot.  Denies any GI bleed.  Physical exam revealed patient was afebrile tachycardic with elevated blood pressure.  She was noted to have significant stroke and was considered for admission to hospital for further evaluation and treatment..  Assessment and Plan:  * CVA (cerebral vascular accident)  Seen by neurology.  Imaging noted   left MCA stroke with concern for right hemisphere involvement as well and multiple vascular territories concerning for possible central embolic stroke.  Lipid panel with LDL of 139.  Hemoglobin A1c of 5.3. MRA/MRI brain shows patchy acute left MCA distribution infarct involving left frontal, parietal, temporal lobes without significant mass effect or hemorrhage.  Check 2D echocardiogram-pending.  Neurochecks.  Patient will likely need 30-day event monitor at discharge.  Currently on permissive hypertension.  Will get PT OT speech therapy evaluation.  Communicated with GI due to previous history of ischemic colitis and since patient does not have any ongoing GI bleed okay for aspirin and Plavix.   Patient has been recommended Pletal and Plavix as per neurology for 3 weeks followed by Plavix alone..  Crestor 40 mg has been initiated  Hypokalemia.  Replenished.  Check levels in AM.    Hypothyroidism Was abnormal on last admission.  Check TSH.   Anxiety, bipolar 1 disorder. - Continue Rexulti, Trintellix, Xanax and doxepin   GERD (gastroesophageal reflux disease) History of GI bleed. Continue Protonix.  AKI (acute kidney injury)  - Creatinine at baseline 1.2, creatinine on presentation at 1.6.  Will continue to monitor.  Check BMP in AM.   Essential (primary) hypertension - Holding olmesartan, hydrochlorothiazide, Aldactone, Imdur for permissive hypertension   Hyperlipidemia Lipid panel showing LDL of 139.  Hemoglobin A1c of 5.3.    COPD mixed type  - Continue Anoro and albuterol.  Appears compensated.   Diabetes mellitus with neuropathy  Hemoglobin A1c of 5.3.  If needed add sliding scale coverage.  Was on farxiga as outpatient  Chronic pain syndrome with history of L4-L5 decompression and fusion.   On MS Contin and oxycodone at home.  Continue.  History of vitamin D deficiency.  Was on vitamin D 50,000 units weekly at home.   DVT prophylaxis: heparin injection 5,000 Units Start: 03/22/23 0600 SCDs Start: 03/22/23 0551   Code Status:     Code Status: Full Code  Disposition: Likely need rehabilitation.  PT OT pending  Status is: Inpatient  Remains inpatient appropriate because: Acute stroke, further workup, possible need for rehab   Family Communication:  Spoke with the patient's friend and emergency contact Mr. Hilma Favors on the phone and updated him about the clinical condition of  the patient.  Consultants:  Neurology  Procedures:  None  Antimicrobials:  None  Anti-infectives (From admission, onward)    None      Subjective: Today, patient was seen and examined at bedside.  Complains of issues with expression, right lower extremity  weakness.  Feels anxious.  Seen during echocardiogram.  Objective: Vitals:   03/22/23 0730 03/22/23 0733 03/22/23 0759 03/22/23 1211  BP: (!) 154/110  (!) 142/88   Pulse:  (!) 105 (!) 108   Resp: (!) 27 20 (!) 21   Temp:   97.8 F (36.6 C) 97.8 F (36.6 C)  TempSrc:   Oral Oral  SpO2:  97% 98%    No intake or output data in the 24 hours ending 03/22/23 1308 There were no vitals filed for this visit.  Physical Examination:  There is no height or weight on file to calculate BMI.   General: Obese built, not in obvious distress, alert awake, mildly Communicative HENT:   No scleral pallor or icterus noted. Oral mucosa is moist.  Chest:  Clear breath sounds.  Diminished breath sounds bilaterally. No crackles or wheezes.  CVS: S1 &S2 heard. No murmur.  Regular rate and rhythm. Abdomen: Soft, nontender, nondistended.  Bowel sounds are heard.   Extremities: No cyanosis, clubbing or edema.  Peripheral pulses are palpable. Psych: Alert, awake Communicative but has expressive aphasia, anxious, word salad and dysarthria CNS: Dysarthria, expressive aphasia,.  Moves right upper extremity 3/5, weakness of the right lower extremity 2/ 5. Skin: Warm and dry.  No rashes noted.  Data Reviewed:   CBC: Recent Labs  Lab 03/21/23 1746 03/21/23 1750 03/22/23 1211  WBC 9.7  --  9.3  NEUTROABS 7.8*  --  7.7  HGB 10.4* 11.9* 9.3*  HCT 33.9* 35.0* 30.1*  MCV 85.8  --  85.3  PLT 366  --  312    Basic Metabolic Panel: Recent Labs  Lab 03/21/23 1746 03/21/23 1750  NA 138 140  K 4.1 4.1  CL 104 107  CO2 21*  --   GLUCOSE 112* 112*  BUN 19 20  CREATININE 1.68* 1.60*  CALCIUM 9.1  --     Liver Function Tests: Recent Labs  Lab 03/21/23 1746  AST 25  ALT 14  ALKPHOS 153*  BILITOT 1.1  PROT 7.5  ALBUMIN 3.1*     Radiology Studies: MR Brain Wo Contrast (neuro protocol)  Result Date: 03/22/2023 CLINICAL DATA:  Initial evaluation for acute aphasia. EXAM: MRI HEAD WITHOUT CONTRAST  MRA HEAD WITHOUT CONTRAST TECHNIQUE: Multiplanar, multi-echo pulse sequences of the brain and surrounding structures were acquired without intravenous contrast. Angiographic images of the Circle of Willis were acquired using MRA technique without intravenous contrast. COMPARISON:  Prior CT from earlier the same day. FINDINGS: MRI HEAD FINDINGS Brain: Cerebral volume within normal limits. Chronic right frontal infarct with associated chronic hemosiderin staining. Patchy restricted diffusion involving the cortical left frontal, parietal, and temporal lobes, consistent with an acute left MCA distribution infarct. Minimal patchy subcortical involvement about the left basal ganglia. 9 mm acute ischemic infarct involving the contralateral right frontal centrum semi ovale (series 2, image 38). Few additional scattered small volume bilateral cerebellar infarcts, left larger than right. No associated hemorrhage or significant mass effect. A central thromboembolic etiology is suspected. No mass lesion or midline shift. No hydrocephalus or extra-axial fluid collection. Empty sella noted. Vascular: Major intracranial vascular flow voids are maintained. Skull and upper cervical spine: Craniocervical junction within normal limits. Bone marrow  signal intensity normal. No scalp soft tissue abnormality. Sinuses/Orbits: Globes orbital soft tissues within normal limits. Paranasal sinuses are largely clear. Trace left mastoid effusion, of doubtful significance. Other: None. MRA HEAD FINDINGS Anterior circulation: Examination degraded by motion artifact. Both internal carotid arteries are patent to the termini without significant stenosis. A1 segments patent bilaterally. Grossly normal anterior communicating artery complex. Partially visualized ACAs grossly patent to their distal aspects without visible stenosis. No visible M1 stenosis or occlusion. No visible proximal MCA branch occlusion or high-grade stenosis. Distal MCA branches not  well assessed on this motion degraded exam. Posterior circulation: Right vertebral artery dominant and widely patent without stenosis. Right PICA patent. Left vertebral artery hypoplastic and largely terminates in PICA. Left PICA patent as well. Basilar patent without stenosis. Superior cerebellar arteries not well seen on this motion degraded exam. Both PCAs appear to be primarily supplied via the basilar and are grossly patent to their distal aspects without visible stenosis. Anatomic variants: As above.  No visible aneurysm. IMPRESSION: MRI HEAD IMPRESSION: 1. Patchy acute left MCA distribution infarct involving the left frontal, parietal, and temporal lobes. No associated hemorrhage or significant mass effect. 2. Additional small volume acute ischemic nonhemorrhagic infarcts involving the contralateral right frontal centrum semi ovale and left greater than right cerebellum. Given the various vascular distributions involved, a central thromboembolic etiology is suspected. 3. Chronic right frontal infarct. MRA HEAD IMPRESSION: 1. Motion degraded exam. 2. Grossly negative intracranial MRA for large vessel occlusion. No visible hemodynamically significant or correctable stenosis. Electronically Signed   By: Rise Mu M.D.   On: 03/22/2023 00:41   MR ANGIO HEAD WO CONTRAST  Result Date: 03/22/2023 CLINICAL DATA:  Initial evaluation for acute aphasia. EXAM: MRI HEAD WITHOUT CONTRAST MRA HEAD WITHOUT CONTRAST TECHNIQUE: Multiplanar, multi-echo pulse sequences of the brain and surrounding structures were acquired without intravenous contrast. Angiographic images of the Circle of Willis were acquired using MRA technique without intravenous contrast. COMPARISON:  Prior CT from earlier the same day. FINDINGS: MRI HEAD FINDINGS Brain: Cerebral volume within normal limits. Chronic right frontal infarct with associated chronic hemosiderin staining. Patchy restricted diffusion involving the cortical left  frontal, parietal, and temporal lobes, consistent with an acute left MCA distribution infarct. Minimal patchy subcortical involvement about the left basal ganglia. 9 mm acute ischemic infarct involving the contralateral right frontal centrum semi ovale (series 2, image 38). Few additional scattered small volume bilateral cerebellar infarcts, left larger than right. No associated hemorrhage or significant mass effect. A central thromboembolic etiology is suspected. No mass lesion or midline shift. No hydrocephalus or extra-axial fluid collection. Empty sella noted. Vascular: Major intracranial vascular flow voids are maintained. Skull and upper cervical spine: Craniocervical junction within normal limits. Bone marrow signal intensity normal. No scalp soft tissue abnormality. Sinuses/Orbits: Globes orbital soft tissues within normal limits. Paranasal sinuses are largely clear. Trace left mastoid effusion, of doubtful significance. Other: None. MRA HEAD FINDINGS Anterior circulation: Examination degraded by motion artifact. Both internal carotid arteries are patent to the termini without significant stenosis. A1 segments patent bilaterally. Grossly normal anterior communicating artery complex. Partially visualized ACAs grossly patent to their distal aspects without visible stenosis. No visible M1 stenosis or occlusion. No visible proximal MCA branch occlusion or high-grade stenosis. Distal MCA branches not well assessed on this motion degraded exam. Posterior circulation: Right vertebral artery dominant and widely patent without stenosis. Right PICA patent. Left vertebral artery hypoplastic and largely terminates in PICA. Left PICA patent as well. Basilar patent without  stenosis. Superior cerebellar arteries not well seen on this motion degraded exam. Both PCAs appear to be primarily supplied via the basilar and are grossly patent to their distal aspects without visible stenosis. Anatomic variants: As above.  No  visible aneurysm. IMPRESSION: MRI HEAD IMPRESSION: 1. Patchy acute left MCA distribution infarct involving the left frontal, parietal, and temporal lobes. No associated hemorrhage or significant mass effect. 2. Additional small volume acute ischemic nonhemorrhagic infarcts involving the contralateral right frontal centrum semi ovale and left greater than right cerebellum. Given the various vascular distributions involved, a central thromboembolic etiology is suspected. 3. Chronic right frontal infarct. MRA HEAD IMPRESSION: 1. Motion degraded exam. 2. Grossly negative intracranial MRA for large vessel occlusion. No visible hemodynamically significant or correctable stenosis. Electronically Signed   By: Rise Mu M.D.   On: 03/22/2023 00:41   DG CHEST PORT 1 VIEW  Result Date: 03/21/2023 CLINICAL DATA:  Altered mental status.  MRI clearance. EXAM: PORTABLE CHEST 1 VIEW COMPARISON:  Chest radiograph 01/30/2023 FINDINGS: No implanted medical device or radiopaque foreign body to preclude MRI imaging. Cardiomegaly is stable. Minor atelectasis at the left lung base. No confluent airspace disease, large pleural effusion or pneumothorax. IMPRESSION: 1. No implanted medical device or radiopaque foreign body to preclude MRI imaging. 2. Stable cardiomegaly. Minor left basilar atelectasis. Electronically Signed   By: Narda Rutherford M.D.   On: 03/21/2023 21:54   DG Abd 1 View  Result Date: 03/21/2023 CLINICAL DATA:  Altered mental status.  MRI clearance. EXAM: ABDOMEN - 1 VIEW COMPARISON:  CT 02/05/2023 FINDINGS: Lumbosacral fusion hardware. Enteric sutures in the left upper quadrant. No radiopaque foreign body or implanted medical device to preclude MRI imaging. Nonobstructive bowel gas pattern. Chronic changes about the sacroiliac joints and pubic symphysis. IMPRESSION: 1. No radiopaque foreign body or implanted medical device to preclude MRI imaging. 2. Enteric sutures in the left upper quadrant.  Lumbosacral fusion hardware. Electronically Signed   By: Narda Rutherford M.D.   On: 03/21/2023 21:53   CT HEAD WO CONTRAST  Result Date: 03/21/2023 CLINICAL DATA:  Neurological deficit. EXAM: CT HEAD WITHOUT CONTRAST TECHNIQUE: Contiguous axial images were obtained from the base of the skull through the vertex without intravenous contrast. RADIATION DOSE REDUCTION: This exam was performed according to the departmental dose-optimization program which includes automated exposure control, adjustment of the mA and/or kV according to patient size and/or use of iterative reconstruction technique. COMPARISON:  None Available. FINDINGS: Brain: There is mild cerebral atrophy with widening of the extra-axial spaces and ventricular dilatation. There are areas of decreased attenuation within the white matter tracts of the supratentorial brain, consistent with microvascular disease changes. Chronic right frontal lobe and left posterior parietal infarcts are seen. Vascular: No hyperdense vessel or unexpected calcification. Skull: Normal. Negative for fracture or focal lesion. Sinuses/Orbits: No acute finding. Other: None. IMPRESSION: 1. Generalized cerebral atrophy with chronic white matter small vessel ischemic changes. 2. Chronic right frontal lobe and left posterior parietal infarcts. 3. No acute intracranial abnormality. Electronically Signed   By: Aram Candela M.D.   On: 03/21/2023 17:49      LOS: 0 days     Joycelyn Das, MD [facility] Available via Epic secure chat 7am-7pm After these hours, please refer to coverage provider listed on [email or web address] 03/22/2023, 1:08 PM

## 2023-03-22 NOTE — Hospital Course (Addendum)
Caroline Williams is a 62 y.o. female with medical history significant of anxiety, bipolar 1 disorder, coronary artery disease, COPD, depression, diabetes mellitus type 2, history of Roux-en-Y, GERD, hypothyroidism, hypertension, neuropathy, presented to the hospital with aphasia.  At baseline patient does not drive or cook and is able to manage her own medication.  Patient was recently admitted hospital for melena and a hemoglobin of 3.0 and was noted to have ischemic colitis.  At that time, general surgery was consulted and patient was treated conservatively with bowel rest, liquid diet, IV Zosyn.  Apparently family does not think she is returned to her baseline since then.  On this presentation, patient was unable to express and had some pain in the right foot.  Denies any GI bleed.  Physical exam revealed patient was afebrile tachycardic with elevated blood pressure.   Imaging pertinent for left MCA stroke with concern for right hemisphere involvement as well and multiple vascular territories concerning for possible central embolic stroke.MRA/MRI brain shows patchy acute left MCA distribution infarct involving left frontal, parietal, temporal lobes without significant mass effect or hemorrhage.  A1c of 5.3 and lipid panel with LDL of 139.  CTA was done and found to have bilateral submassive PE with CT evidence of right heart strain.  Had intermediate Pesi score.  She has an extensive clot in transit in the right atrium extending into the left atrium across the PFO. This is the etiology of her stroke.  Elevated troponin and BNP, likely secondary to  or RV dysfunction.  EF of 40 to 45% which is not very far from her baseline. No aggressive diuresis recommended at this time due to severe RV dysfunction and submassive PE.  Cardiology is on board.  PCCM and IR was also consulted but unfortunately she is not a candidate for any intervention.She is at high risk for further embolic phenomenon. Catheter directed  thrombolysis or thrombectomy with IR would be high risk given the clot in transit putting her at risk for further embolic disease.  PCCM did consider half dose systemic thrombolysis but with her recent GI bleed that will be contraindicated.  Currently very limited choices, after lengthy discussion between cardiology, neurology, PCCM and IR it was decided to just continue with heparin infusion for now.  Patient will likely need lifelong anticoagulation.  Very high risk for deterioration.  Palliative care was also consulted to discuss goals of care.  10/9: Patient currently awake, appears to be disoriented and confused, talking but not answering questions appropriately.  On room air and no significant respiratory distress.  10/10: Vitals mostly stable and remained on room air.  Lower extremity venous Doppler positive for age-indeterminate right femoral, tibial and popliteal vein DVT.  Discussed with neurology and patient likely will only take anticoagulation, no antiplatelet to decrease the risk of bleeding. Hypercoagulable panel ordered. Patient had a transient SVT this morning, remained asymptomatic.  Cardiology also added metoprolol.  10/11: Hemodynamically stable.  No new deficit.  Remained on heparin infusion which can be switched to NOAC when our neurology is okay with that.  Pending palliative care meeting PT is recommending SNF  10/12: Vital stable.  Transitioning heparin to Xarelto.  Family would like to continue full code and full scope of care.  10/13: Patient remained stable, awaiting insurance authorization.

## 2023-03-22 NOTE — Assessment & Plan Note (Signed)
With history of CKD stage IIIb - Creatinine at baseline 1.2, creatinine on admission was 1.68, slowly improving - Holding nephrotoxic agents when possible - Monitor renal function

## 2023-03-22 NOTE — Progress Notes (Signed)
[facility] night cross cover note:   I was notified by Dr. Deanne Coffer of [facility] that this patient's CTA chest with PE protocol shows evidence of acute bilateral pulmonary emboli.   Per my brief chart review, she is here with acute ischemic CVA, with concern for embolic source, with MRI/CT head showing no evidence of intracranial hemorrhage, and is already noted to be on heparin drip, which will be continued in light of these findings.     Newton Pigg, DO Hospitalist

## 2023-03-22 NOTE — Evaluation (Signed)
Occupational Therapy Evaluation Patient Details Name: Caroline Williams MRN: [id number] DOB: [birth year] Today's Date: 03/22/2023   History of Present Illness 62 y.o. female with medical history significant of anxiety, bipolar 1 disorder, coronary artery disease, COPD, depression, diabetes mellitus type 2, history of Roux-en-Y, GERD, hypothyroidism, hypertension, neuropathy, presented to the hospital with aphasia, R TKR   Clinical Impression   Patient admitted for the above diagnosis.  Prior status taken from prior admit, patient having difficulty articulating prior level.  Currently she is needing up to Max A for mobility and ADL completion.  OT to follow in the acute setting and Patient will benefit from continued inpatient follow up therapy, <3 hours/day        If plan is discharge home, recommend the following: Assist for transportation;Assistance with cooking/housework;A lot of help with walking and/or transfers;A lot of help with bathing/dressing/bathroom;Direct supervision/assist for financial management;Direct supervision/assist for medications management    Functional Status Assessment  Patient has had a recent decline in their functional status and demonstrates the ability to make significant improvements in function in a reasonable and predictable amount of time.  Equipment Recommendations  BSC/3in1;Tub/shower seat;Wheelchair (measurements OT);Wheelchair cushion (measurements OT)    Recommendations for Other Services       Precautions / Restrictions Precautions Precautions: Fall Precaution Comments: Watch BP Restrictions Weight Bearing Restrictions: No      Mobility Bed Mobility Overal bed mobility: Needs Assistance Bed Mobility: Supine to Sit, Sit to Supine     Supine to sit: Mod assist Sit to supine: Mod assist        Transfers Overall transfer level: Needs assistance   Transfers: Sit to/from Stand Sit to Stand: Mod assist                  Balance  Overall balance assessment: Needs assistance Sitting-balance support: Feet unsupported, Bilateral upper extremity supported Sitting balance-Leahy Scale: Fair     Standing balance support: Reliant on assistive device for balance Standing balance-Leahy Scale: Poor                             ADL either performed or assessed with clinical judgement   ADL Overall ADL's : Needs assistance/impaired Eating/Feeding: Set up;Bed level   Grooming: Wash/dry hands;Wash/dry face;Set up;Bed level   Upper Body Bathing: Moderate assistance;Bed level   Lower Body Bathing: Maximal assistance;Bed level   Upper Body Dressing : Moderate assistance;Sitting   Lower Body Dressing: Maximal assistance;Sitting/lateral leans   Toilet Transfer: Maximal assistance;Stand-pivot;BSC/3in1                   Vision   Vision Assessment?: No apparent visual deficits     Perception Perception: Not tested       Praxis Praxis: Not tested       Pertinent Vitals/Pain Pain Assessment Pain Assessment: Faces Faces Pain Scale: Hurts even more Pain Location: R knee Pain Descriptors / Indicators: Grimacing Pain Intervention(s): Monitored during session     Extremity/Trunk Assessment Upper Extremity Assessment Upper Extremity Assessment: Generalized weakness   Lower Extremity Assessment Lower Extremity Assessment: Defer to PT evaluation   Cervical / Trunk Assessment Cervical / Trunk Assessment: Kyphotic   Communication Communication Communication: Difficulty following commands/understanding;Difficulty communicating thoughts/reduced clarity of speech Following commands: Follows one step commands inconsistently   Cognition Arousal: Alert Behavior During Therapy: Anxious Overall Cognitive Status: Difficult to assess  General Comments   BP 166/101    Exercises     Shoulder Instructions      Home Living Family/patient expects  to be discharged to:: Private residence Living Arrangements: Spouse/significant other Available Help at Discharge: Family;Available 24 hours/day Type of Home: House Home Access: Stairs to enter [employer] of Steps: 3   Home Layout: One level     Bathroom Shower/Tub: Chief Strategy Officer: Standard Bathroom Accessibility: Yes How Accessible: Accessible via walker;Accessible via wheelchair Home Equipment: Gilmer Mor - single point;Tub bench;Rolling Walker (2 wheels)          Prior Functioning/Environment               Mobility Comments: cane prior to PLIF, was using RW after back sx. ADLs Comments: per last admission: independent, light IADls; not driving; no longer cooks or cleans        OT Problem List: Decreased strength;Decreased activity tolerance;Impaired balance (sitting and/or standing);Decreased knowledge of precautions;Decreased safety awareness;Decreased cognition;Pain      OT Treatment/Interventions: Self-care/ADL training;Therapeutic activities;Balance training;DME and/or AE instruction;Energy conservation;Patient/family education;Neuromuscular education;Therapeutic exercise;Cognitive remediation/compensation    OT Goals(Current goals can be found in the care plan section) Acute Rehab OT Goals OT Goal Formulation: Patient unable to participate in goal setting Time For Goal Achievement: 04/05/23 Potential to Achieve Goals: Fair ADL Goals Pt Will Perform Grooming: with contact guard assist;standing Pt Will Perform Upper Body Dressing: with min assist;sitting Pt Will Perform Lower Body Dressing: with mod assist;sit to/from stand Pt Will Transfer to Toilet: with contact guard assist;stand pivot transfer;bedside commode  OT Frequency: Min 1X/week    Co-evaluation              AM-PAC OT "6 Clicks" Daily Activity     Outcome Measure Help from another person eating meals?: A Little Help from another person taking care of personal  grooming?: A Little Help from another person toileting, which includes using toliet, bedpan, or urinal?: A Lot Help from another person bathing (including washing, rinsing, drying)?: A Lot Help from another person to put on and taking off regular upper body clothing?: A Lot Help from another person to put on and taking off regular lower body clothing?: A Lot 6 Click Score: 14   End of Session Equipment Utilized During Treatment: Gait belt;Rolling walker (2 wheels) Nurse Communication: Mobility status  Activity Tolerance: Patient limited by pain Patient left: in bed;with call bell/phone within reach  OT Visit Diagnosis: Unsteadiness on feet (R26.81);Muscle weakness (generalized) (M62.81)                Time: 1610-9604 OT Time Calculation (min): 22 min Charges:  OT General Charges $OT Visit: 1 Visit OT Evaluation $OT Eval Moderate Complexity: 1 Mod  03/22/2023  RP, OTR/L  Acute Rehabilitation Services  Office:  [phone]   Caroline Williams 03/22/2023, 2:19 PM

## 2023-03-22 NOTE — Assessment & Plan Note (Signed)
-   Holding olmesartan, hydrochlorothiazide, Aldactone, Imdur for permissive hypertension

## 2023-03-22 NOTE — Assessment & Plan Note (Signed)
-   Continue Rexulti, Trintellix, Xanax and doxepin

## 2023-03-22 NOTE — Evaluation (Signed)
Clinical/Bedside Swallow Evaluation Patient Details  Name: Caroline Williams MRN: [id number] Date of Birth: Jan 21, 1961  Today's Date: 03/22/2023 Time: SLP Start Time (ACUTE ONLY): 1044 SLP Stop Time (ACUTE ONLY): 1054 SLP Time Calculation (min) (ACUTE ONLY): 10 min  Past Medical History:  Past Medical History:  Diagnosis Date   Anxiety    Arthritis    Asthma    Bipolar 1 disorder (HCC)    Breast discharge    Breast lump    Breast pain    CAD (coronary artery disease) 12/03/2022    o Cath 2008: no CAD   o CCTA 10/05/19: CAC score 49, 88th percentile, nonobstructive CAD (LM 1-24 mid and dist)  o Myoview 12/31/20: EF 45, no ischemia or infarction, low risk    Chronic pain    on MS Contin and oxycodone   COPD (chronic obstructive pulmonary disease) (HCC)    Depression    Diabetes mellitus    diet and exercise controlled   Dysrhythmia    HR in 30s on metoprolol   Escherichia coli (E. coli) infection    Fever    GERD (gastroesophageal reflux disease)    History of chest pain    History of knee replacement, total    Hypertension    Hypothyroidism    N&V (nausea and vomiting)    Peripheral neuropathy    Right foot no sensation   Sciatica    Sleep apnea    does not use CPAP   Thyroid disease    Wears glasses    Past Surgical History:  Past Surgical History:  Procedure Laterality Date   ABDOMINAL HYSTERECTOMY     partial   APPENDECTOMY     BIOPSY  03/14/2018   Procedure: BIOPSY;  Surgeon: Charlott Rakes, MD;  Location: [facility]OS[facility];  Service: Endoscopy;;   BIOPSY  02/05/2023   Procedure: BIOPSY;  Surgeon: Charlott Rakes, MD;  Location: [facility] ENDOSCOPY;  Service: Gastroenterology;;   BREAST DUCTAL SYSTEM EXCISION Right 08/31/2017   Procedure: RIGHT BREAST CENTRAL DUCT EXCISION;  Surgeon: Harriette Bouillon, MD;  Location: [facility];  Service: General;  Laterality: Right;   BREAST EXCISIONAL BIOPSY Right    BREAST LUMPECTOMY     right   CARDIAC  CATHETERIZATION     no significant CAD, nl LV function by 11/08/06 cath   CESAREAN SECTION     x4   COLONOSCOPY WITH PROPOFOL N/A 03/14/2018   Procedure: COLONOSCOPY WITH PROPOFOL;  Surgeon: Charlott Rakes, MD;  Location: [facility];  Service: Endoscopy;  Laterality: N/A;   COLONOSCOPY WITH PROPOFOL Left 02/05/2023   Procedure: COLONOSCOPY WITH PROPOFOL;  Surgeon: Charlott Rakes, MD;  Location: [facility] ENDOSCOPY;  Service: Gastroenterology;  Laterality: Left;   ESOPHAGOGASTRODUODENOSCOPY (EGD) WITH PROPOFOL Left 01/31/2023   Procedure: ESOPHAGOGASTRODUODENOSCOPY (EGD) WITH PROPOFOL;  Surgeon: Willis Modena, MD;  Location[facility];  Service: Gastroenterology;  Laterality: Left;   GASTRIC ROUX-EN-Y N/A 11/25/2014   Procedure: LAPAROSCOPIC ROUX-EN-Y GASTRIC BYPASS WITH UPPER ENDOSCOPY;  Surgeon: Luretha Murphy, MD;  Location: [facility] ORS;  Service: General;  Laterality: N/A;   HERNIA REPAIR     left inguinal   JOINT REPLACEMENT     KNEE SURGERY     right knee   MOUTH SURGERY     MYOMECTOMY ABDOMINAL APPROACH  1989   POLYPECTOMY  03/14/2018   Procedure: POLYPECTOMY;  Surgeon: Charlott Rakes, MD;  Location: [facility] ENDOSCOPY;  Service: Endoscopy;;   TOTAL KNEE REVISION  06/21/2012   Procedure: TOTAL KNEE REVISION;  Surgeon:  Nadara Mustard, MD;  Location: [facility] OR;  Service: Orthopedics;  Laterality: Right;  Revision Right Total Knee Arthroplasty   HPI:  Caroline Williams is a 62 y.o. female who presented the ED with a chief complaint of aphasia. MRI 10/8: "Patchy acute left MCA distribution infarct involving the left  frontal, parietal, and temporal lobes. No associated hemorrhage or significant mass effect." CXR 10/7: " Minor atelectasis at the left lung base. No confluent airspace disease, large pleural effusion or pneumothorax."  Pt with medical history significant of anxiety, bipolar 1 disorder, coronary artery disease, COPD, depression, diabetes mellitus type 2, history of Roux-en-Y, GERD,  hypothyroidism, hypertension, neuropathy, and more.    Assessment / Plan / Recommendation  Clinical Impression  Pt presents with functional swallowing as assessed clinically.  There were no clinical s/s of aspiration with any consistencies trialed and pt exhibited adequate oral clearance of regular solid.   Recommend continuing regular texture diet with thin liquids.   SLP Visit Diagnosis: Dysphagia, unspecified (R13.10)    Aspiration Risk  No limitations    Diet Recommendation Regular;Thin liquid    Liquid Administration via: Cup;Straw Medication Administration: Whole meds with liquid (as tolerated) Supervision: Staff to assist with self feeding Compensations: Slow rate;Small sips/bites Postural Changes: Seated upright at 90 degrees    Other  Recommendations Oral Care Recommendations: Oral care BID    Recommendations for follow up therapy are one component of a multi-disciplinary discharge planning process, led by the attending physician.  Recommendations may be updated based on patient status, additional functional criteria and insurance authorization.  Follow up Recommendations Acute inpatient rehab (3hours/day)      Assistance Recommended at Discharge Frequent or constant Supervision/Assistance  Functional Status Assessment Patient has not had a recent decline in their functional status  Frequency and Duration  (N/A)          Prognosis Prognosis for improved oropharyngeal function:  (N/A)      Swallow Study   General Date of Onset: 03/21/23 HPI: Caroline Williams is a 62 y.o. female who presented the ED with a chief complaint of aphasia. MRI 10/8: "Patchy acute left MCA distribution infarct involving the left  frontal, parietal, and temporal lobes. No associated hemorrhage or significant mass effect." CXR 10/7: " Minor atelectasis at the left lung base. No confluent airspace disease, large pleural effusion or pneumothorax."  Pt with medical history significant of anxiety,  bipolar 1 disorder, coronary artery disease, COPD, depression, diabetes mellitus type 2, history of Roux-en-Y, GERD, hypothyroidism, hypertension, neuropathy, and more. Type of Study: Bedside Swallow Evaluation Previous Swallow Assessment: None Diet Prior to this Study: Regular;Thin liquids (Level 0) Temperature Spikes Noted: No History of Recent Intubation: No Behavior/Cognition: Cooperative;Requires cueing;Alert Oral Cavity Assessment: Within Functional Limits Oral Care Completed by SLP: No Oral Cavity - Dentition: Adequate natural dentition;Missing dentition Self-Feeding Abilities: Needs assist Patient Positioning: Upright in bed Baseline Vocal Quality: Normal Volitional Cough: Strong Volitional Swallow: Unable to elicit    Oral/Motor/Sensory Function Overall Oral Motor/Sensory Function: Within functional limits Facial ROM: Reduced right Facial Symmetry: Abnormal symmetry right Lingual ROM: Within Functional Limits Lingual Symmetry: Within Functional Limits Velum:  (Could not test) Mandible: Within Functional Limits   Ice Chips Ice chips: Not tested   Thin Liquid Thin Liquid: Within functional limits Presentation: Straw    Nectar Thick Nectar Thick Liquid: Not tested   Honey Thick Honey Thick Liquid: Not tested   Puree Puree: Within functional limits Presentation: Spoon   Solid  Solid: Within functional limits Presentation:  (SLP fed)      Kerrie Pleasure, MA, CCC-SLP Acute Rehabilitation Services Office: [phone] 03/22/2023,11:40 AM

## 2023-03-22 NOTE — Progress Notes (Signed)
Pt returned from CT at this time.  

## 2023-03-22 NOTE — Assessment & Plan Note (Signed)
Well-controlled with A1c of 5.3 - Continue to monitor CBG - If needed add sliding scale coverage

## 2023-03-23 ENCOUNTER — Inpatient Hospital Stay (HOSPITAL_COMMUNITY): Payer: Medicare HMO

## 2023-03-23 ENCOUNTER — Encounter (HOSPITAL_COMMUNITY)
Admission: EM | Disposition: A | Discharge status: Skilled Nursing Facility | Payer: Self-pay | Source: Home / Self Care | Attending: Internal Medicine

## 2023-03-23 ENCOUNTER — Other Ambulatory Visit (HOSPITAL_COMMUNITY): Payer: Medicare HMO

## 2023-03-23 DIAGNOSIS — I639 Cerebral infarction, unspecified: Secondary | ICD-10-CM

## 2023-03-23 DIAGNOSIS — Z86711 Personal history of pulmonary embolism: Secondary | ICD-10-CM

## 2023-03-23 DIAGNOSIS — M545 Low back pain, unspecified: Secondary | ICD-10-CM

## 2023-03-23 DIAGNOSIS — Z8719 Personal history of other diseases of the digestive system: Secondary | ICD-10-CM | POA: Diagnosis not present

## 2023-03-23 DIAGNOSIS — I2609 Other pulmonary embolism with acute cor pulmonale: Secondary | ICD-10-CM | POA: Diagnosis not present

## 2023-03-23 DIAGNOSIS — I5189 Other ill-defined heart diseases: Secondary | ICD-10-CM | POA: Diagnosis not present

## 2023-03-23 DIAGNOSIS — I2602 Saddle embolus of pulmonary artery with acute cor pulmonale: Secondary | ICD-10-CM | POA: Diagnosis not present

## 2023-03-23 DIAGNOSIS — I5021 Acute systolic (congestive) heart failure: Secondary | ICD-10-CM

## 2023-03-23 DIAGNOSIS — I63412 Cerebral infarction due to embolism of left middle cerebral artery: Secondary | ICD-10-CM | POA: Diagnosis not present

## 2023-03-23 DIAGNOSIS — G8929 Other chronic pain: Secondary | ICD-10-CM

## 2023-03-23 LAB — CBC
HCT: 33.8 % — ABNORMAL LOW (ref 36.0–46.0)
Hemoglobin: 10.2 g/dL — ABNORMAL LOW (ref 12.0–15.0)
MCH: 26.1 pg (ref 26.0–34.0)
MCHC: 30.2 g/dL (ref 30.0–36.0)
MCV: 86.4 fL (ref 80.0–100.0)
Platelets: 326 10*3/uL (ref 150–400)
RBC: 3.91 MIL/uL (ref 3.87–5.11)
RDW: 16.9 % — ABNORMAL HIGH (ref 11.5–15.5)
WBC: 10.3 10*3/uL (ref 4.0–10.5)
nRBC: 0 % (ref 0.0–0.2)

## 2023-03-23 LAB — HEPARIN LEVEL (UNFRACTIONATED)
Heparin Unfractionated: 0.35 [IU]/mL (ref 0.30–0.70)
Heparin Unfractionated: 0.2 [IU]/mL — ABNORMAL LOW (ref 0.30–0.70)
Heparin Unfractionated: 0.42 [IU]/mL (ref 0.30–0.70)

## 2023-03-23 LAB — GLUCOSE, CAPILLARY
Glucose-Capillary: 78 mg/dL (ref 70–99)
Glucose-Capillary: 79 mg/dL (ref 70–99)
Glucose-Capillary: 114 mg/dL — ABNORMAL HIGH (ref 70–99)
Glucose-Capillary: 173 mg/dL — ABNORMAL HIGH (ref 70–99)

## 2023-03-23 LAB — BASIC METABOLIC PANEL
Anion gap: 16 — ABNORMAL HIGH (ref 5–15)
BUN: 14 mg/dL (ref 8–23)
CO2: 17 mmol/L — ABNORMAL LOW (ref 22–32)
Calcium: 8.6 mg/dL — ABNORMAL LOW (ref 8.9–10.3)
Chloride: 106 mmol/L (ref 98–111)
Creatinine, Ser: 1.53 mg/dL — ABNORMAL HIGH (ref 0.44–1.00)
GFR, Estimated: 38 mL/min — ABNORMAL LOW (ref >60)
Glucose, Bld: 84 mg/dL (ref 70–99)
Potassium: 4.2 mmol/L (ref 3.5–5.1)
Sodium: 139 mmol/L (ref 135–145)

## 2023-03-23 LAB — MAGNESIUM: Magnesium: 2 mg/dL (ref 1.7–2.4)

## 2023-03-23 SURGERY — ECHOCARDIOGRAM, TRANSESOPHAGEAL
Anesthesia: Monitor Anesthesia Care

## 2023-03-23 MED ORDER — SODIUM CHLORIDE 0.9% FLUSH
10.0000 mL | Freq: Two times a day (BID) | INTRAVENOUS | Status: DC
  Administered 2023-03-23 – 2023-03-29 (×13): 10 mL via INTRAVENOUS

## 2023-03-23 NOTE — Progress Notes (Signed)
PHARMACY - ANTICOAGULATION CONSULT NOTE  Pharmacy Consult for heparin infusion Indication:  coronary thrombus  + acute PE w/ RHS  Allergies  Allergen Reactions   Asa [Aspirin] Anaphylaxis, Shortness Of Breath and Other (See Comments)    Respiratory distress  Pt states she has had Toradol several times without any reactions   Bee Venom Anaphylaxis   Sulfa Antibiotics Anaphylaxis   Nsaids Other (See Comments)    History of gastric bypass   Other Other (See Comments)    No blood products.  Patient did  Request that only albumin or albumin-containing products may be administered   Repatha [Evolocumab] Other (See Comments)    Unknown reaction    Patient Measurements: Height: 5\' 5"  (165.1 cm) Weight: 97.5 kg (215 lb) (weight from 8/24) IBW/kg (Calculated) : 57 Heparin Dosing Weight: 79 kg  Vital Signs: Temp: 97.9 F (36.6 C) (10/09 2130) Temp Source: Oral (10/09 2130) BP: 136/85 (10/09 2130) Pulse Rate: 103 (10/09 2130)  Labs: Recent Labs    03/21/23 1746 03/21/23 1750 03/22/23 1211 03/22/23 1729 03/22/23 1817 03/23/23 0041 03/23/23 0517 03/23/23 0934 03/23/23 2118  HGB 10.4* 11.9* 9.3*  --   --   --  10.2*  --   --   HCT 33.9* 35.0* 30.1*  --   --   --  33.8*  --   --   PLT 366  --  312  --   --   --  326  --   --   APTT 30  --   --   --   --   --   --   --   --   LABPROT 13.6  --   --   --   --   --   --   --   --   INR 1.0  --   --   --   --   --   --   --   --   HEPARINUNFRC  --   --   --   --   --  0.35  --  0.20* 0.42  CREATININE 1.68* 1.60* 1.56*  --   --   --  1.53*  --   --   TROPONINIHS  --   --   --  1,100* 1,027*  --   --   --   --     Estimated Creatinine Clearance: 43.5 mL/min (A) (by C-G formula based on SCr of 1.53 mg/dL (H)).   Medical History: Past Medical History:  Diagnosis Date   Anxiety    Arthritis    Asthma    Bipolar 1 disorder (HCC)    Breast discharge    Breast lump    Breast pain    CAD (coronary artery disease) 12/03/2022     o Cath 2008: no CAD   o CCTA 10/05/19: CAC score 49, 88th percentile, nonobstructive CAD (LM 1-24 mid and dist)  o Myoview 12/31/20: EF 45, no ischemia or infarction, low risk    Chronic pain    on MS Contin and oxycodone   COPD (chronic obstructive pulmonary disease) (HCC)    Depression    Diabetes mellitus    diet and exercise controlled   Dysrhythmia    HR in 30s on metoprolol   Escherichia coli (E. coli) infection    Fever    GERD (gastroesophageal reflux disease)    History of chest pain    History of knee replacement, total    Hypertension  Hypothyroidism    N&V (nausea and vomiting)    Peripheral neuropathy    Right foot no sensation   Sciatica    Sleep apnea    does not use CPAP   Thyroid disease    Wears glasses     Assessment: 61YOF with a PMH of CAD and recent GI hemorrhage not on [facility] PTA who is admitted with left MCA stroke with concern for right hemisphere involvement and concern for central embolic stroke. Patient now found to have left atrial mass with PFO and mass extending into the right atrium. CT positive for acute PE with CT evidence of right heart strain (RV/LV Ratio = 1.54). Pharmacy consulted to dose heparin.  No bolus per MD and will have lower therapeutic goal due to CVA.   Heparin level 0.42, therapeutic, on heparin infusion at 1150 units/hr.  No issues with heparin infusion reported.  No s/sx of bleeding per RN report. Neuro exam is stable with signs of improvement.  Goal of Therapy:  Heparin level 0.3-0.5 units/ml Monitor platelets by anticoagulation protocol: Yes   Plan:  Continue heparin infusion at 1150 units/hr Check heparin level in 8 hours and daily while on heparin Continue to monitor H&H and platelets  Thank you for allowing pharmacy to be a part of this patient's care.  Wilburn Cornelia, PharmD, BCPS Clinical Pharmacist 03/23/2023 10:44 PM   Please refer to [facility] for pharmacy phone number

## 2023-03-23 NOTE — Consult Note (Signed)
NAME:  Caroline Williams, MRN:  [id number], DOB:  November 28, 1960, LOS: 1 ADMISSION DATE:  03/21/2023, CONSULTATION DATE: 03/22/2022 for REFERRING MD: [facility], CHIEF COMPLAINT: Pulmonary embolism  History of Present Illness:  61 year old female with a plethora of health issues that are well-documented below now complicated by PE with a question raised whether or not lytics would be appropriate at this time.  He does have a history of a recent GI bleed.  She has had a stroke.  She is currently on heparin drip.  She would not be a good candidate for lytic therapy at this time.   Past Medical History:  Diagnosis Date   Anxiety    Arthritis    Asthma    Bipolar 1 disorder (HCC)    Breast discharge    Breast lump    Breast pain    CAD (coronary artery disease) 12/03/2022    o Cath 2008: no CAD   o CCTA 10/05/19: CAC score 49, 88th percentile, nonobstructive CAD (LM 1-24 mid and dist)  o Myoview 12/31/20: EF 45, no ischemia or infarction, low risk    Chronic pain    on MS Contin and oxycodone   COPD (chronic obstructive pulmonary disease) (HCC)    Depression    Diabetes mellitus    diet and exercise controlled   Dysrhythmia    HR in 30s on metoprolol   Escherichia coli (E. coli) infection    Fever    GERD (gastroesophageal reflux disease)    History of chest pain    History of knee replacement, total    Hypertension    Hypothyroidism    N&V (nausea and vomiting)    Peripheral neuropathy    Right foot no sensation   Sciatica    Sleep apnea    does not use CPAP   Thyroid disease    Wears glasses      Significant Hospital Events: Including procedures, antibiotic start and stop dates in addition to other pertinent events     Interim History / Subjective:  Obese female difficult to arouse  Objective   Blood pressure 136/88, pulse 97, temperature 98.3 F (36.8 C), temperature source Oral, resp. rate 16, height 5\' 5"  (1.651 m), weight 97.5 kg, SpO2 100%.        Intake/Output  Summary (Last 24 hours) at 03/23/2023 0947 Last data filed at 03/23/2023 0400 Gross per 24 hour  Intake 1618.49 ml  Output 830 ml  Net 788.49 ml   Filed Weights   03/22/23 1500  Weight: 97.5 kg    Examination: General: Morbidly obese female who is very difficult to arouse no JVD or lymphadenopathy is appreciated HENT: No JVD Lungs: Diminished breath sounds throughout Cardiovascular: Heart sounds are distant  Abdomen: Obese soft nontender Extremities: Without edema Neuro: Extremely lethargic difficult to arouse   Resolved Hospital Problem list     Assessment & Plan:  Pulmonary critical care for new PE in the setting of new stroke and history of gastrointestinal bleed July 2024.  She currently is on heparin drip pulmonary critical care is consulted for possible lytics which most likely would not be a good idea in the setting of recent GI bleed. Agree with heparin drip Monitor for complications of GI bleeding  Stroke Per neurology Consider decreasing narcotics  Diabetes Per primary  History of bipolar disease Per primary   Best Practice (right click and "Reselect all SmartList Selections" daily)   Diet/type: Regular consistency (see orders) DVT prophylaxis: systemic heparin GI prophylaxis:  PPI Lines: N/A Foley:  N/A Code Status:  full code Last date of multidisciplinary goals of care discussion [tbd]  Labs   CBC: Recent Labs  Lab 03/21/23 1746 03/21/23 1750 03/22/23 1211 03/23/23 0517  WBC 9.7  --  9.3 10.3  NEUTROABS 7.8*  --  7.7  --   HGB 10.4* 11.9* 9.3* 10.2*  HCT 33.9* 35.0* 30.1* 33.8*  MCV 85.8  --  85.3 86.4  PLT 366  --  312 326    Basic Metabolic Panel: Recent Labs  Lab 03/21/23 1746 03/21/23 1750 03/22/23 1211 03/23/23 0517  NA 138 140 139 139  K 4.1 4.1 3.0* 4.2  CL 104 107 106 106  CO2 21*  --  22 17*  GLUCOSE 112* 112* 117* 84  BUN 19 20 17 14   CREATININE 1.68* 1.60* 1.56* 1.53*  CALCIUM 9.1  --  8.3* 8.6*  MG  --   --  1.9  2.0   GFR: Estimated Creatinine Clearance: 43.5 mL/min (A) (by C-G formula based on SCr of 1.53 mg/dL (H)). Recent Labs  Lab 03/21/23 1746 03/22/23 1211 03/23/23 0517  WBC 9.7 9.3 10.3    Liver Function Tests: Recent Labs  Lab 03/21/23 1746 03/22/23 1211  AST 25 23  ALT 14 10  ALKPHOS 153* 137*  BILITOT 1.1 0.8  PROT 7.5 6.6  ALBUMIN 3.1* 2.7*   No results for input(s): "LIPASE", "AMYLASE" in the last 168 hours. No results for input(s): "AMMONIA" in the last 168 hours.  ABG    Component Value Date/Time   HCO3 19.2 (L) 01/31/2023 0607   TCO2 22 03/21/2023 1750   ACIDBASEDEF 7.0 (H) 01/31/2023 0607   O2SAT 47.5 01/31/2023 0607     Coagulation Profile: Recent Labs  Lab 03/21/23 1746  INR 1.0    Cardiac Enzymes: No results for input(s): "CKTOTAL", "CKMB", "CKMBINDEX", "TROPONINI" in the last 168 hours.  HbA1C: HbA1c, POC (controlled diabetic range)  Date/Time Value Ref Range Status  10/07/2022 02:05 PM 6.5 0.0 - 7.0 % Final  07/13/2021 10:07 AM 6.8 0.0 - 7.0 % Final   Hgb A1c MFr Bld  Date/Time Value Ref Range Status  03/22/2023 12:11 PM 5.3 4.8 - 5.6 % Final    Comment:    (NOTE) Pre diabetes:          5.7%-6.4%  Diabetes:              >6.4%  Glycemic control for   <7.0% adults with diabetes   03/22/2023 05:37 AM 5.3 4.8 - 5.6 % Final    Comment:    (NOTE) Pre diabetes:          5.7%-6.4%  Diabetes:              >6.4%  Glycemic control for   <7.0% adults with diabetes     CBG: Recent Labs  Lab 03/22/23 0713 03/22/23 0823 03/22/23 1835 03/22/23 2351 03/23/23 0610  GLUCAP 101* 96 83 108* 78    Review of Systems:   na  Past Medical History:  She,  has a past medical history of Anxiety, Arthritis, Asthma, Bipolar 1 disorder (HCC), Breast discharge, Breast lump, Breast pain, CAD (coronary artery disease) (12/03/2022), Chronic pain, COPD (chronic obstructive pulmonary disease) (HCC), Depression, Diabetes mellitus, Dysrhythmia,  Escherichia coli (E. coli) infection, Fever, GERD (gastroesophageal reflux disease), History of chest pain, History of knee replacement, total, Hypertension, Hypothyroidism, N&V (nausea and vomiting), Peripheral neuropathy, Sciatica, Sleep apnea, Thyroid disease, and Wears glasses.  Surgical History:   Past Surgical History:  Procedure Laterality Date   ABDOMINAL HYSTERECTOMY     partial   APPENDECTOMY     BIOPSY  03/14/2018   Procedure: BIOPSY;  Surgeon: Charlott Rakes, MD;  Location: [facility];  Service: Endoscopy;;   BIOPSY  02/05/2023   Procedure: BIOPSY;  Surgeon: Charlott Rakes, MD;  Location: [facility] ENDOSCOPY;  Service: Gastroenterology;;   BREAST DUCTAL SYSTEM EXCISION Right 08/31/2017   Procedure: RIGHT BREAST CENTRAL DUCT EXCISION;  Surgeon: Harriette Bouillon, MD;  Location: [facility];  Service: General;  Laterality: Right;   BREAST EXCISIONAL BIOPSY Right    BREAST LUMPECTOMY     right   CARDIAC CATHETERIZATION     no significant CAD, nl LV function by 11/08/06 cath   CESAREAN SECTION     x4   COLONOSCOPY WITH PROPOFOL N/A 03/14/2018   Procedure: COLONOSCOPY WITH PROPOFOL;  Surgeon: Charlott Rakes, MD;  Location: [facility];  Service: Endoscopy;  Laterality: N/A;   COLONOSCOPY WITH PROPOFOL Left 02/05/2023   Procedure: COLONOSCOPY WITH PROPOFOL;  Surgeon: Charlott Rakes, MD;  Location: [facility] ENDOSCOPY;  Service: Gastroenterology;  Laterality: Left;   ESOPHAGOGASTRODUODENOSCOPY (EGD) WITH PROPOFOL Left 01/31/2023   Procedure: ESOPHAGOGASTRODUODENOSCOPY (EGD) WITH PROPOFOL;  Surgeon: Willis Modena, MD;  Location: [facility] ENDOSCOPY;  Service: Gastroenterology;  Laterality: Left;   GASTRIC ROUX-EN-Y N/A 11/25/2014   Procedure: LAPAROSCOPIC ROUX-EN-Y GASTRIC BYPASS WITH UPPER ENDOSCOPY;  Surgeon: Luretha Murphy, MD;  Location: [facility]S;  Service: General;  Laterality: N/A;   HERNIA REPAIR     left inguinal   JOINT REPLACEMENT     KNEE SURGERY     right knee    MOUTH SURGERY     MYOMECTOMY ABDOMINAL APPROACH  1989   POLYPECTOMY  03/14/2018   Procedure: POLYPECTOMY;  Surgeon: Charlott Rakes, MD;  Location: [facility];  Service: Endoscopy;;   TOTAL KNEE REVISION  06/21/2012   Procedure: TOTAL KNEE REVISION;  Surgeon: Nadara Mustard, MD;  Location: [facility] OR;  Service: Orthopedics;  Laterality: Right;  Revision Right Total Knee Arthroplasty     Social History:   reports that she quit smoking about 4 years ago. Her smoking use included cigarettes. She started smoking about 47 years ago. She has a 11 pack-year smoking history. She has never used smokeless tobacco. She reports that she does not currently use alcohol. She reports that she does not use drugs.   Family History:  Her family history includes Cancer in her father; Depression in her sister; Diabetes in her father; Heart disease in her father; Hypertension in her father and mother; Stroke in her father. There is no history of Breast cancer.   Allergies Allergies  Allergen Reactions   Aspirin Other (See Comments), Shortness Of Breath and Anaphylaxis    Pt states she has had Toradol several times without any reactions  Other Reaction(s): Other (See Comments), respiratory distress   Bee Venom Anaphylaxis   Sulfa Antibiotics Anaphylaxis   Evolocumab     Other Reaction(s): Unknown   Nsaids Other (See Comments)    History of gastric bypass   Other Other (See Comments)    No blood products.  Patient did  Request that only albumin or albumin-containing products may be administered     Home Medications  Prior to Admission medications   Medication Sig Start Date End Date Taking? Authorizing Provider  albuterol (VENTOLIN HFA) 108 (90 Base) MCG/ACT inhaler INHALE 2 PUFFS INTO THE LUNGS EVERY 6 HOURS AS NEEDED FOR WHEEZING  OR SHORTNESS OF BREATH Patient taking differently: Inhale 2 puffs into the lungs every 6 (six) hours as needed for wheezing or shortness of breath. 09/23/21   Hoy Register, MD   Alirocumab (PRALUENT) 150 MG/ML SOAJ INJECT 1 PEN INTO SKIN EVERY 14 DAYS. Patient taking differently: Inject 150 mg as directed See admin instructions. INJECT 1 PEN INTO SKIN EVERY 14 DAYS. 12/03/22   Tereso Newcomer T, PA-C  ALPRAZolam Prudy Feeler) 1 MG tablet Take 1 mg by mouth 3 (three) times daily as needed for anxiety. 07/02/14   [provider]  amphetamine-dextroamphetamine (ADDERALL XR) 30 MG 24 hr capsule Take 30 mg by mouth daily.  03/10/18   [provider]  amphetamine-dextroamphetamine (ADDERALL) 20 MG tablet Take 20 mg by mouth every evening. 10/13/17   [provider]  Blood Glucose Monitoring Suppl (ACCU-CHEK GUIDE) w/Device KIT 1 kit by Does not apply route 3 (three) times daily. To check blood sugars 04/10/21   Hoy Register, MD  cetirizine (ZYRTEC) 10 MG tablet Take 1 tablet (10 mg total) by mouth daily. 04/07/21   Hoy Register, MD  Cyanocobalamin (VITAMIN B 12 PO) Take 1 tablet by mouth daily. Unsure of dose    [provider]  cyclobenzaprine (FLEXERIL) 10 MG tablet Take 1 tablet (10 mg total) by mouth 3 (three) times daily as needed for muscle spasms. 01/14/23   Tressie Stalker, MD  diclofenac Sodium (VOLTAREN) 1 % GEL Apply 2-4 g topically 2 (two) times daily as needed (wrist/knee pain). 11/23/21   Hoy Register, MD  docusate sodium (COLACE) 100 MG capsule Take 1 capsule (100 mg total) by mouth 2 (two) times daily. 01/14/23   Tressie Stalker, MD  doxepin (SINEQUAN) 75 MG capsule Take 75 mg by mouth at bedtime. 03/02/18   [provider]  FARXIGA 5 MG TABS tablet TAKE ONE TABLET BY MOUTH DAILY BEFORE BREAKFAST Patient taking differently: Take 5 mg by mouth daily. 03/17/23   Hoy Register, MD  fluticasone (FLONASE) 50 MCG/ACT nasal spray SHAKE LIQUID AND USE TWO SPRAYS IN EACH NOSTRIL DAILY Patient taking differently: Place 2 sprays into both nostrils See admin instructions. SHAKE LIQUID AND USE TWO SPRAYS IN EACH NOSTRIL DAILY 12/21/22    Hoy Register, MD  glucose blood (ACCU-CHEK GUIDE) test strip Use as instructed Patient taking differently: 1 each by Other route See admin instructions. Use as instructed 04/10/21   Hoy Register, MD  hydrocortisone-pramoxine (ANALPRAM HC) 2.5-1 % rectal cream Place 1 application rectally 3 (three) times daily. 07/13/21   Hoy Register, MD  isosorbide mononitrate (IMDUR) 30 MG 24 hr tablet Take 1 tablet (30 mg total) by mouth daily. 12/11/20   Lyn Records, MD  Lancets (ACCU-CHEK MULTICLIX) lancets Use as instructed Patient taking differently: 1 each by Other route See admin instructions. Use as instructed 04/10/21   Hoy Register, MD  levothyroxine (SYNTHROID) 150 MCG tablet TAKE 1 TABLET(150 MCG) BY MOUTH DAILY BEFORE BREAKFAST Patient taking differently: Take 150 mcg by mouth daily before breakfast. 03/18/23   Hoy Register, MD  linaclotide (LINZESS) 290 MCG CAPS capsule Take 290 mcg by mouth See admin instructions. Every other night    [provider]  metoprolol succinate (TOPROL-XL) 25 MG 24 hr tablet Take 25 mg by mouth daily. 03/09/23   [provider]  morphine (MS CONTIN) 15 MG 12 hr tablet Take 1 tablet (15 mg total) by mouth every 12 (twelve) hours. 11/14/14   Jones Bales, NP  nabumetone (RELAFEN) 750 MG tablet Take  750 mg by mouth daily.    [provider]  naloxone [facility]) nasal spray 4 mg/0.1 mL Place 1 spray into the nose as needed (overdose). 05/22/20   [provider]  nicotine (NICODERM CQ - DOSED IN MG/24 HR) 7 mg/24hr patch Place 1 patch (7 mg total) onto the skin daily. 02/09/23   Pokhrel, Rebekah Chesterfield, MD  nitroGLYCERIN (NITROSTAT) 0.4 MG SL tablet DISSOLVE 1 TABLET UNDER THE TONGUE EVERY 5 MINUTES AS NEEDED Patient taking differently: Place 0.4 mg under the tongue See admin instructions. DISSOLVE 1 TABLET UNDER THE TONGUE EVERY 5 MINUTES AS NEEDED 12/03/22   Tereso Newcomer T, PA-C  Olmesartan-amLODIPine-HCTZ 40-5-25 MG TABS Take 1 tablet  by mouth daily. 12/08/22   Hoy Register, MD  omeprazole (PRILOSEC) 40 MG capsule TAKE 1 CAPSULE(40 MG) BY MOUTH DAILY Patient taking differently: Take 40 mg by mouth daily. 01/22/20   Fulp, Cammie, MD  ondansetron (ZOFRAN) 4 MG tablet Take 1 tablet (4 mg total) by mouth every 6 (six) hours as needed for nausea. 02/08/23   Pokhrel, Rebekah Chesterfield, MD  Oxycodone HCl 10 MG TABS Take 10 mg by mouth 3 (three) times daily as needed.    [provider]  oxyCODONE-acetaminophen (PERCOCET/ROXICET) 5-325 MG tablet Take 1-2 tablets by mouth every 4 (four) hours as needed for moderate pain. 01/14/23   Tressie Stalker, MD  promethazine (PHENERGAN) 25 MG tablet Take 1 tablet (25 mg total) by mouth every 8 (eight) hours as needed for nausea or vomiting. 12/24/22   Delorse Lek, FNP  REXULTI 2 MG TABS tablet Take 2 mg by mouth daily.    [provider]  rOPINIRole (REQUIP) 2 MG tablet Take 1 tablet (2 mg total) by mouth at bedtime. 02/08/23 03/10/23  Pokhrel, Rebekah Chesterfield, MD  solifenacin (VESICARE) 5 MG tablet TAKE ONE TABLET BY MOUTH DAILY 03/17/23   Hoy Register, MD  spironolactone (ALDACTONE) 25 MG tablet Take 0.5 tablets (12.5 mg total) by mouth daily. 12/08/22   Hoy Register, MD  umeclidinium-vilanterol (ANORO ELLIPTA) 62.5-25 MCG/INH AEPB Inhale 1 puff into the lungs daily. 04/17/19   Waymon Budge, MD  Vitamin D, Ergocalciferol, (DRISDOL) 1.25 MG (50000 UNIT) CAPS capsule Take 1 capsule (50,000 Units total) by mouth every 7 (seven) days. 02/09/23   Pokhrel, Rebekah Chesterfield, MD  vortioxetine HBr (TRINTELLIX) 20 MG TABS tablet Take 20 mg by mouth daily.    [provider]  escitalopram (LEXAPRO) 20 MG tablet Take 20 mg by mouth daily.  12/14/18  [provider]  LATUDA 120 MG TABS Take 120 mg by mouth at bedtime.  03/04/18 12/14/18  [provider]  LISINOPRIL PO Take by mouth daily.    09/06/11  [provider]  metoCLOPramide (REGLAN) 10 MG tablet Take 1 tablet (10 mg total) by  mouth every 8 (eight) hours as needed for nausea. Patient not taking: Reported on 12/06/2019 06/30/18 05/12/20  Marily Memos, MD     Critical care time: Elizebeth Brooking Tanairy Payeur ACNP Acute Care Nurse Practitioner [facility] Pulmonary/Critical Care Please consult Amion 03/23/2023, 9:47 AM

## 2023-03-23 NOTE — Progress Notes (Signed)
[facility] night cross cover note:   I was notified by RN that the patient has the urge to urinate, but is unable to spontaneously pass urine.  Ensuing bladder scan reveals 700 cc.  I subsequently placed order for straight cath x 1 now.     Newton Pigg, DO Hospitalist

## 2023-03-23 NOTE — Assessment & Plan Note (Signed)
CTA positive for bilateral submassive PE with right ventricular strain.  Echocardiogram with concern of clot in transit.  Lengthy interdisciplinary conversation between cardiology, PCCM, neurology and IR and unfortunately patient is not a candidate for any surgical intervention or thrombolytic therapy. Will be very high risk for progression.  Currently stable on room air -Continue with heparin infusion -Closely monitor

## 2023-03-23 NOTE — Progress Notes (Signed)
Progress Note   Patient: Caroline Williams YNW:[id number] DOB: 22-Feb-1961 DOA: 03/21/2023     1 DOS: the patient was seen and examined on 03/23/2023   Brief hospital course: Caroline Williams is a 62 y.o. female with medical history significant of anxiety, bipolar 1 disorder, coronary artery disease, COPD, depression, diabetes mellitus type 2, history of Roux-en-Y, GERD, hypothyroidism, hypertension, neuropathy, presented to the hospital with aphasia.  At baseline patient does not drive or cook and is able to manage her own medication.  Patient was recently admitted hospital for melena and a hemoglobin of 3.0 and was noted to have ischemic colitis.  At that time, general surgery was consulted and patient was treated conservatively with bowel rest, liquid diet, IV Zosyn.  Apparently family does not think she is returned to her baseline since then.  On this presentation, patient was unable to express and had some pain in the right foot.  Denies any GI bleed.  Physical exam revealed patient was afebrile tachycardic with elevated blood pressure.   Imaging pertinent for left MCA stroke with concern for right hemisphere involvement as well and multiple vascular territories concerning for possible central embolic stroke.MRA/MRI brain shows patchy acute left MCA distribution infarct involving left frontal, parietal, temporal lobes without significant mass effect or hemorrhage.  A1c of 5.3 and lipid panel with LDL of 139.  CTA was done and found to have bilateral submassive PE with CT evidence of right heart strain.  Had intermediate Pesi score.  She has an extensive clot in transit in the right atrium extending into the left atrium across the PFO. This is the etiology of her stroke.  Elevated troponin and BNP, likely secondary to  or RV dysfunction.  EF of 40 to 45% which is not very far from her baseline. No aggressive diuresis recommended at this time due to severe RV dysfunction and submassive PE.  Cardiology is on  board.  PCCM and IR was also consulted but unfortunately she is not a candidate for any intervention.She is at high risk for further embolic phenomenon. Catheter directed thrombolysis or thrombectomy with IR would be high risk given the clot in transit putting her at risk for further embolic disease.  PCCM did consider half dose systemic thrombolysis but with her recent GI bleed that will be contraindicated.  Currently very limited choices, after lengthy discussion between cardiology, neurology, PCCM and IR it was decided to just continue with heparin infusion for now.  Patient will likely need lifelong anticoagulation.  Very high risk for deterioration.  Palliative care was also consulted to discuss goals of care.  10/9: Patient currently awake, appears to be disoriented and confused, talking but not answering questions appropriately.  On room air and no significant respiratory distress.  Assessment and Plan: * CVA (cerebral vascular accident) (HCC) - Evaluated by neurology and found to have a left MCA stroke with concern for right hemisphere involvement as well and multiple vascular territories concerning for possible central embolic stroke Patient was also found to have submassive bilateral PE, concern of cor pulmonale with right ventricular strain, a large and transient clot between atrium. She was started on heparin infusion. Neurology to decide about antiplatelets Continue with Crestor PT and OT are recommending SNF  Pulmonary embolism with acute cor pulmonale (HCC) CTA positive for bilateral submassive PE with right ventricular strain.  Echocardiogram with concern of clot in transit.  Lengthy interdisciplinary conversation between cardiology, PCCM, neurology and IR and unfortunately patient is not a candidate for  any surgical intervention or thrombolytic therapy. Will be very high risk for progression.  Currently stable on room air -Continue with heparin infusion -Closely monitor  Acute  clinical systolic heart failure (HCC) Echocardiogram with EF of 40 to 45%, which is slightly below than her baseline of 50% Also concern of right ventricular strain with extensive PE and clot in transit. Not a candidate for aggressive diuresis although troponin and BNP were elevated due to acute right ventricular strain and massive PE. -Cardiology is on board  History of GI bleed History of recent GI bleed and ischemic colitis. Hemoglobin currently stable at 10.2 -Monitor hemoglobin -Transfuse if below 7  GERD (gastroesophageal reflux disease) - With history of melena/GI hemorrhage - Continue Protonix - Closely monitor hemoglobin, at the end of August it was as low as 3.0 - Continue to monitor  Diabetes mellitus with neuropathy (HCC) Well-controlled with A1c of 5.3 - Continue to monitor CBG - If needed add sliding scale coverage  Essential (primary) hypertension - Holding olmesartan, hydrochlorothiazide, Aldactone, Imdur for permissive hypertension  Chronic low back pain History of L4-L5 decompression and fusion. -Continue home MS Contin and oxycodone  Hypothyroidism - TSH markedly elevated last time it was checked approximately 6 weeks ago, Improved but still elevated. - Continue Synthroid-need to have a close follow-up with PCP for further dose adjustment  COPD mixed type (HCC) - Continue Anoro and albuterol  Hyperlipidemia - Lipid panel pending   Psychiatric disorder - Continue Rexulti, Trintellix, Xanax and doxepin  AKI (acute kidney injury) (HCC) With history of CKD stage IIIb - Creatinine at baseline 1.2, creatinine on admission was 1.68, slowly improving - Holding nephrotoxic agents when possible - Monitor renal function   Subjective: Patient was seen and examined today.  She was awake and talking but not able to answer questions appropriately.  No shortness of breath or chest pain.  Physical Exam: Vitals:   03/22/23 2350 03/23/23 0330 03/23/23 0338  03/23/23 0838  BP: 118/85 (!) 144/115 (!) 160/98 136/88  Pulse:  97    Resp: 19 18  16   Temp: 97.9 F (36.6 C) 98.1 F (36.7 C)  98.3 F (36.8 C)  TempSrc: Oral Oral  Oral  SpO2: 92% 93%  100%  Weight:      Height:       General.  Frail lady, in no acute distress. Pulmonary.  Lungs clear bilaterally, normal respiratory effort. CV.  Regular rate and rhythm, no JVD, rub or murmur. Abdomen.  Soft, nontender, nondistended, BS positive. CNS.  Alert but not oriented.  3+/5 in left lower extremity. Extremities.  No edema, no cyanosis, pulses intact and symmetrical. Psychiatry.  Judgment and insight appears impaired.  Data Reviewed: Prior data reviewed  Family Communication: Multiple family members at bedside  Disposition: Status is: Inpatient Remains inpatient appropriate because: Severity of illness  Planned Discharge Destination: Skilled nursing facility  DVT prophylaxis.  Heparin infusion Time spent: 55 minutes  This record has been created using Conservation officer, historic buildings. Errors have been sought and corrected,but may not always be located. Such creation errors do not reflect on the standard of care.     Author: Arnetha Courser, MD 03/23/2023 2:02 PM  For on call review [email or web address].

## 2023-03-23 NOTE — Progress Notes (Signed)
PHARMACY - ANTICOAGULATION  Pharmacy Consult for heparin  Indication: R atrial thrombus and PE Brief A/P: Heparin level within goal range Continue Heparin at current rate   Allergies  Allergen Reactions   Aspirin Other (See Comments), Shortness Of Breath and Anaphylaxis    Pt states she has had Toradol several times without any reactions  Other Reaction(s): Other (See Comments), respiratory distress   Bee Venom Anaphylaxis   Sulfa Antibiotics Anaphylaxis   Evolocumab     Other Reaction(s): Unknown   Nsaids Other (See Comments)    History of gastric bypass   Other Other (See Comments)    No blood products.  Patient did  Request that only albumin or albumin-containing products may be administered    Patient Measurements: Height: 5\' 5"  (165.1 cm) Weight: 97.5 kg (215 lb) (weight from 8/24) IBW/kg (Calculated) : 57 Heparin Dosing Weight: 79.1 kg  Vital Signs: Temp: 97.9 F (36.6 C) (10/08 2350) Temp Source: Oral (10/08 2350) BP: 118/85 (10/08 2350) Pulse Rate: 108 (10/08 1935)  Labs: Recent Labs    03/21/23 1746 03/21/23 1750 03/22/23 1211 03/22/23 1729 03/22/23 1817 03/23/23 0041  HGB 10.4* 11.9* 9.3*  --   --   --   HCT 33.9* 35.0* 30.1*  --   --   --   PLT 366  --  312  --   --   --   APTT 30  --   --   --   --   --   LABPROT 13.6  --   --   --   --   --   INR 1.0  --   --   --   --   --   HEPARINUNFRC  --   --   --   --   --  0.35  CREATININE 1.68* 1.60* 1.56*  --   --   --   TROPONINIHS  --   --   --  1,100* 1,027*  --     Estimated Creatinine Clearance: 42.7 mL/min (A) (by C-G formula based on SCr of 1.56 mg/dL (H)).   Assessment: 62 y.o. female admitted with CVA, found to have R atrial thrombus into PFO and bilateral PE for heparin   Goal of Therapy:  Heparin level 0.3-0.5 units/ml Monitor platelets by anticoagulation protocol: Yes   Plan:  Continue Heparin at current rate  Check heparin level in 6 hours to verify  Geannie Risen, PharmD, BCPS   03/23/2023 2:02 AM

## 2023-03-23 NOTE — Assessment & Plan Note (Signed)
History of L4-L5 decompression and fusion. -Continue home MS Contin and oxycodone

## 2023-03-23 NOTE — Progress Notes (Addendum)
image reconstructions and MIPs were obtained to evaluate the vascular anatomy. RADIATION DOSE REDUCTION: This exam was performed according to the departmental dose-optimization program which includes automated exposure control, adjustment of the mA and/or kV according to patient size and/or use of iterative reconstruction technique. CONTRAST:  75mL OMNIPAQUE IOHEXOL 350 MG/ML SOLN COMPARISON:  09/10/2021 FINDINGS: Cardiovascular: SVC patent. Heart size upper limits normal. No pericardial effusion. Curvilinear filling defect in the right atrium, extending  across the interatrial septum into the left atrium. Central pulmonary arteries dilated, RV/LV ratio 1.54. Good contrast opacification of the pulmonary arterial tree. Central, saddle and segmental pulmonary emboli bilaterally, new since previous. Left coronary calcifications. Adequate contrast opacification of the thoracic aorta with no evidence of dissection, aneurysm, or stenosis. There is classic 3-vessel brachiocephalic arch anatomy without proximal stenosis. Minimal scattered calcified plaque in the arch. Mediastinum/Nodes: No mediastinal hematoma, mass, or adenopathy. Lungs/Pleura: Small left pleural effusion, new since previous. No pneumothorax. Pulmonary emphysema. Dependent atelectasis in both lung bases left greater than right. Peripheral somewhat geographic ground-glass opacities in both apices, and in the anterior and lateral left upper lobe. Upper Abdomen: Post gastric bypass surgery. 2.4 cm low-attenuation lesion peripherally in hepatic segment 2, stable since previous consistent with benign cyst. No acute findings. Musculoskeletal: Vertebral endplate spurring at multiple levels in the mid and lower thoracic spine. Review of the MIP images confirms the above findings. IMPRESSION: 1. Positive for acute PE with CT evidence of right heart strain (RV/LV Ratio = 1.54) consistent with at least submassive (intermediate risk) PE. The presence of right heart strain has been associated with an increased risk of morbidity and mortality. Please refer to the "Code PE Focused" order set in EPIC. 2. Probable clot in transit across the interatrial septum from right to left atrium, significant systemic embolic risk. Critical Value/emergent results were sent by secure chat at the time of interpretation on 03/22/2023 at 8:44 pm to provider Velna Ochs, who acknowledged these results. 3. Small left pleural effusion. 4. Peripheral somewhat geographic ground-glass opacities in both apices, and in the anterior and lateral  left upper lobe. 5. Aortic Atherosclerosis (ICD10-I70.0) and Emphysema (ICD10-J43.9). Electronically Signed   By: Corlis Leak M.D.   On: 03/22/2023 20:45   VAS US CAROTID  Result Date: 03/22/2023 Carotid Arterial Duplex Study Patient Name:  Caroline Williams  Date of Exam:   03/22/2023 Medical Rec #: [id number]        Accession #:    [id number] Date of Birth: May 22, 1961        Patient Gender: F Patient Age:   62 years Exam Location:  [facility] Procedure:      VAS US CAROTID Referring Phys: Leticia Penna --------------------------------------------------------------------------------  Indications:       Carotid artery disease. Risk Factors:      Hypertension, Diabetes, coronary artery disease. Comparison Study:  No prior study Performing Technologist: Shona Simpson  Examination Guidelines: A complete evaluation includes B-mode imaging, spectral Doppler, color Doppler, and power Doppler as needed of all accessible portions of each vessel. Bilateral testing is considered an integral part of a complete examination. Limited examinations for reoccurring indications may be performed as noted.  Right Carotid Findings: +----------+--------+--------+--------+------------------+------------------+           PSV cm/sEDV cm/sStenosisPlaque DescriptionComments           +----------+--------+--------+--------+------------------+------------------+ CCA Prox  93      13                                                   +----------+--------+--------+--------+------------------+------------------+  image reconstructions and MIPs were obtained to evaluate the vascular anatomy. RADIATION DOSE REDUCTION: This exam was performed according to the departmental dose-optimization program which includes automated exposure control, adjustment of the mA and/or kV according to patient size and/or use of iterative reconstruction technique. CONTRAST:  75mL OMNIPAQUE IOHEXOL 350 MG/ML SOLN COMPARISON:  09/10/2021 FINDINGS: Cardiovascular: SVC patent. Heart size upper limits normal. No pericardial effusion. Curvilinear filling defect in the right atrium, extending  across the interatrial septum into the left atrium. Central pulmonary arteries dilated, RV/LV ratio 1.54. Good contrast opacification of the pulmonary arterial tree. Central, saddle and segmental pulmonary emboli bilaterally, new since previous. Left coronary calcifications. Adequate contrast opacification of the thoracic aorta with no evidence of dissection, aneurysm, or stenosis. There is classic 3-vessel brachiocephalic arch anatomy without proximal stenosis. Minimal scattered calcified plaque in the arch. Mediastinum/Nodes: No mediastinal hematoma, mass, or adenopathy. Lungs/Pleura: Small left pleural effusion, new since previous. No pneumothorax. Pulmonary emphysema. Dependent atelectasis in both lung bases left greater than right. Peripheral somewhat geographic ground-glass opacities in both apices, and in the anterior and lateral left upper lobe. Upper Abdomen: Post gastric bypass surgery. 2.4 cm low-attenuation lesion peripherally in hepatic segment 2, stable since previous consistent with benign cyst. No acute findings. Musculoskeletal: Vertebral endplate spurring at multiple levels in the mid and lower thoracic spine. Review of the MIP images confirms the above findings. IMPRESSION: 1. Positive for acute PE with CT evidence of right heart strain (RV/LV Ratio = 1.54) consistent with at least submassive (intermediate risk) PE. The presence of right heart strain has been associated with an increased risk of morbidity and mortality. Please refer to the "Code PE Focused" order set in EPIC. 2. Probable clot in transit across the interatrial septum from right to left atrium, significant systemic embolic risk. Critical Value/emergent results were sent by secure chat at the time of interpretation on 03/22/2023 at 8:44 pm to provider Velna Ochs, who acknowledged these results. 3. Small left pleural effusion. 4. Peripheral somewhat geographic ground-glass opacities in both apices, and in the anterior and lateral  left upper lobe. 5. Aortic Atherosclerosis (ICD10-I70.0) and Emphysema (ICD10-J43.9). Electronically Signed   By: Corlis Leak M.D.   On: 03/22/2023 20:45   VAS US CAROTID  Result Date: 03/22/2023 Carotid Arterial Duplex Study Patient Name:  Caroline Williams  Date of Exam:   03/22/2023 Medical Rec #: [id number]        Accession #:    [id number] Date of Birth: May 22, 1961        Patient Gender: F Patient Age:   62 years Exam Location:  [facility] Procedure:      VAS US CAROTID Referring Phys: Leticia Penna --------------------------------------------------------------------------------  Indications:       Carotid artery disease. Risk Factors:      Hypertension, Diabetes, coronary artery disease. Comparison Study:  No prior study Performing Technologist: Shona Simpson  Examination Guidelines: A complete evaluation includes B-mode imaging, spectral Doppler, color Doppler, and power Doppler as needed of all accessible portions of each vessel. Bilateral testing is considered an integral part of a complete examination. Limited examinations for reoccurring indications may be performed as noted.  Right Carotid Findings: +----------+--------+--------+--------+------------------+------------------+           PSV cm/sEDV cm/sStenosisPlaque DescriptionComments           +----------+--------+--------+--------+------------------+------------------+ CCA Prox  93      13                                                   +----------+--------+--------+--------+------------------+------------------+  image reconstructions and MIPs were obtained to evaluate the vascular anatomy. RADIATION DOSE REDUCTION: This exam was performed according to the departmental dose-optimization program which includes automated exposure control, adjustment of the mA and/or kV according to patient size and/or use of iterative reconstruction technique. CONTRAST:  75mL OMNIPAQUE IOHEXOL 350 MG/ML SOLN COMPARISON:  09/10/2021 FINDINGS: Cardiovascular: SVC patent. Heart size upper limits normal. No pericardial effusion. Curvilinear filling defect in the right atrium, extending  across the interatrial septum into the left atrium. Central pulmonary arteries dilated, RV/LV ratio 1.54. Good contrast opacification of the pulmonary arterial tree. Central, saddle and segmental pulmonary emboli bilaterally, new since previous. Left coronary calcifications. Adequate contrast opacification of the thoracic aorta with no evidence of dissection, aneurysm, or stenosis. There is classic 3-vessel brachiocephalic arch anatomy without proximal stenosis. Minimal scattered calcified plaque in the arch. Mediastinum/Nodes: No mediastinal hematoma, mass, or adenopathy. Lungs/Pleura: Small left pleural effusion, new since previous. No pneumothorax. Pulmonary emphysema. Dependent atelectasis in both lung bases left greater than right. Peripheral somewhat geographic ground-glass opacities in both apices, and in the anterior and lateral left upper lobe. Upper Abdomen: Post gastric bypass surgery. 2.4 cm low-attenuation lesion peripherally in hepatic segment 2, stable since previous consistent with benign cyst. No acute findings. Musculoskeletal: Vertebral endplate spurring at multiple levels in the mid and lower thoracic spine. Review of the MIP images confirms the above findings. IMPRESSION: 1. Positive for acute PE with CT evidence of right heart strain (RV/LV Ratio = 1.54) consistent with at least submassive (intermediate risk) PE. The presence of right heart strain has been associated with an increased risk of morbidity and mortality. Please refer to the "Code PE Focused" order set in EPIC. 2. Probable clot in transit across the interatrial septum from right to left atrium, significant systemic embolic risk. Critical Value/emergent results were sent by secure chat at the time of interpretation on 03/22/2023 at 8:44 pm to provider Velna Ochs, who acknowledged these results. 3. Small left pleural effusion. 4. Peripheral somewhat geographic ground-glass opacities in both apices, and in the anterior and lateral  left upper lobe. 5. Aortic Atherosclerosis (ICD10-I70.0) and Emphysema (ICD10-J43.9). Electronically Signed   By: Corlis Leak M.D.   On: 03/22/2023 20:45   VAS US CAROTID  Result Date: 03/22/2023 Carotid Arterial Duplex Study Patient Name:  Caroline Williams  Date of Exam:   03/22/2023 Medical Rec #: [id number]        Accession #:    [id number] Date of Birth: May 22, 1961        Patient Gender: F Patient Age:   62 years Exam Location:  [facility] Procedure:      VAS US CAROTID Referring Phys: Leticia Penna --------------------------------------------------------------------------------  Indications:       Carotid artery disease. Risk Factors:      Hypertension, Diabetes, coronary artery disease. Comparison Study:  No prior study Performing Technologist: Shona Simpson  Examination Guidelines: A complete evaluation includes B-mode imaging, spectral Doppler, color Doppler, and power Doppler as needed of all accessible portions of each vessel. Bilateral testing is considered an integral part of a complete examination. Limited examinations for reoccurring indications may be performed as noted.  Right Carotid Findings: +----------+--------+--------+--------+------------------+------------------+           PSV cm/sEDV cm/sStenosisPlaque DescriptionComments           +----------+--------+--------+--------+------------------+------------------+ CCA Prox  93      13                                                   +----------+--------+--------+--------+------------------+------------------+  image reconstructions and MIPs were obtained to evaluate the vascular anatomy. RADIATION DOSE REDUCTION: This exam was performed according to the departmental dose-optimization program which includes automated exposure control, adjustment of the mA and/or kV according to patient size and/or use of iterative reconstruction technique. CONTRAST:  75mL OMNIPAQUE IOHEXOL 350 MG/ML SOLN COMPARISON:  09/10/2021 FINDINGS: Cardiovascular: SVC patent. Heart size upper limits normal. No pericardial effusion. Curvilinear filling defect in the right atrium, extending  across the interatrial septum into the left atrium. Central pulmonary arteries dilated, RV/LV ratio 1.54. Good contrast opacification of the pulmonary arterial tree. Central, saddle and segmental pulmonary emboli bilaterally, new since previous. Left coronary calcifications. Adequate contrast opacification of the thoracic aorta with no evidence of dissection, aneurysm, or stenosis. There is classic 3-vessel brachiocephalic arch anatomy without proximal stenosis. Minimal scattered calcified plaque in the arch. Mediastinum/Nodes: No mediastinal hematoma, mass, or adenopathy. Lungs/Pleura: Small left pleural effusion, new since previous. No pneumothorax. Pulmonary emphysema. Dependent atelectasis in both lung bases left greater than right. Peripheral somewhat geographic ground-glass opacities in both apices, and in the anterior and lateral left upper lobe. Upper Abdomen: Post gastric bypass surgery. 2.4 cm low-attenuation lesion peripherally in hepatic segment 2, stable since previous consistent with benign cyst. No acute findings. Musculoskeletal: Vertebral endplate spurring at multiple levels in the mid and lower thoracic spine. Review of the MIP images confirms the above findings. IMPRESSION: 1. Positive for acute PE with CT evidence of right heart strain (RV/LV Ratio = 1.54) consistent with at least submassive (intermediate risk) PE. The presence of right heart strain has been associated with an increased risk of morbidity and mortality. Please refer to the "Code PE Focused" order set in EPIC. 2. Probable clot in transit across the interatrial septum from right to left atrium, significant systemic embolic risk. Critical Value/emergent results were sent by secure chat at the time of interpretation on 03/22/2023 at 8:44 pm to provider Velna Ochs, who acknowledged these results. 3. Small left pleural effusion. 4. Peripheral somewhat geographic ground-glass opacities in both apices, and in the anterior and lateral  left upper lobe. 5. Aortic Atherosclerosis (ICD10-I70.0) and Emphysema (ICD10-J43.9). Electronically Signed   By: Corlis Leak M.D.   On: 03/22/2023 20:45   VAS US CAROTID  Result Date: 03/22/2023 Carotid Arterial Duplex Study Patient Name:  Caroline Williams  Date of Exam:   03/22/2023 Medical Rec #: [id number]        Accession #:    [id number] Date of Birth: May 22, 1961        Patient Gender: F Patient Age:   62 years Exam Location:  [facility] Procedure:      VAS US CAROTID Referring Phys: Leticia Penna --------------------------------------------------------------------------------  Indications:       Carotid artery disease. Risk Factors:      Hypertension, Diabetes, coronary artery disease. Comparison Study:  No prior study Performing Technologist: Shona Simpson  Examination Guidelines: A complete evaluation includes B-mode imaging, spectral Doppler, color Doppler, and power Doppler as needed of all accessible portions of each vessel. Bilateral testing is considered an integral part of a complete examination. Limited examinations for reoccurring indications may be performed as noted.  Right Carotid Findings: +----------+--------+--------+--------+------------------+------------------+           PSV cm/sEDV cm/sStenosisPlaque DescriptionComments           +----------+--------+--------+--------+------------------+------------------+ CCA Prox  93      13                                                   +----------+--------+--------+--------+------------------+------------------+  ECA       33      5                                          +----------+--------+--------+--------+------------------+--------+ +----------+--------+--------+--------+-------------------+           PSV cm/sEDV cm/sDescribeArm Pressure  (mmHG) +----------+--------+--------+--------+-------------------+ Subclavian132     0                                   +----------+--------+--------+--------+-------------------+ +---------+--------+--+--------+-+ VertebralPSV cm/s31EDV cm/s9 +---------+--------+--+--------+-+   Summary: Right Carotid: Velocities in the right ICA are consistent with a 1-39% stenosis. Left Carotid: Velocities in the left ICA are consistent with a 1-39% stenosis. Vertebrals:  Bilateral vertebral arteries demonstrate antegrade flow. Subclavians: Normal flow hemodynamics were seen in bilateral subclavian              arteries. *See table(s) above for measurements and observations.  Electronically signed by Lemar Livings MD on 03/22/2023 at 6:05:31 PM.    Final    ECHOCARDIOGRAM COMPLETE  Result Date: 03/22/2023    ECHOCARDIOGRAM REPORT   Patient Name:   Caroline Williams Date of Exam: 03/22/2023 Medical Rec #:  [id number]       Height:       65.0 in Accession #:    [id number]      Weight:       215.0 lb Date of Birth:  [birth year]       BSA:          2.040 m Patient Age:    61 years        BP:           142/88 mmHg Patient Gender: F               HR:           110 bpm. Exam Location:  Inpatient Procedure: 2D Echo, Cardiac Doppler, Color Doppler and Intracardiac            Opacification Agent Indications:    Stroke I63.9  History:        Patient has prior history of Echocardiogram examinations, most                 recent 12/28/2022. CAD, Stroke and COPD, Arrythmias:PVC,                 Signs/Symptoms:Chest Pain; Risk Factors:Hypertension, Diabetes,                 Dyslipidemia, Current Smoker and Sleep Apnea. CKD, stage 3.  Sonographer:    [name] RCS Referring Phys: [id number] ASIA B ZIERLE-GHOSH  Sonographer Comments: Image acquisition challenging due to uncooperative patient. IMPRESSIONS  1. There is a mobile clot/thrombus in transit sitting in the interatrial septum.  2. Left ventricular ejection fraction, by  estimation, is 40 to 45%. The left ventricle has mildly decreased function. The left ventricle demonstrates global hypokinesis. Left ventricular diastolic parameters are indeterminate.  3. Right ventricular systolic function is normal. The right ventricular size is normal.  4. Thrombus noted in the left atrium.  5. The mitral valve is normal in structure. No evidence of mitral valve regurgitation. No evidence of mitral stenosis.  6. The aortic valve is normal in structure. Aortic valve regurgitation is not visualized. No aortic stenosis is  image reconstructions and MIPs were obtained to evaluate the vascular anatomy. RADIATION DOSE REDUCTION: This exam was performed according to the departmental dose-optimization program which includes automated exposure control, adjustment of the mA and/or kV according to patient size and/or use of iterative reconstruction technique. CONTRAST:  75mL OMNIPAQUE IOHEXOL 350 MG/ML SOLN COMPARISON:  09/10/2021 FINDINGS: Cardiovascular: SVC patent. Heart size upper limits normal. No pericardial effusion. Curvilinear filling defect in the right atrium, extending  across the interatrial septum into the left atrium. Central pulmonary arteries dilated, RV/LV ratio 1.54. Good contrast opacification of the pulmonary arterial tree. Central, saddle and segmental pulmonary emboli bilaterally, new since previous. Left coronary calcifications. Adequate contrast opacification of the thoracic aorta with no evidence of dissection, aneurysm, or stenosis. There is classic 3-vessel brachiocephalic arch anatomy without proximal stenosis. Minimal scattered calcified plaque in the arch. Mediastinum/Nodes: No mediastinal hematoma, mass, or adenopathy. Lungs/Pleura: Small left pleural effusion, new since previous. No pneumothorax. Pulmonary emphysema. Dependent atelectasis in both lung bases left greater than right. Peripheral somewhat geographic ground-glass opacities in both apices, and in the anterior and lateral left upper lobe. Upper Abdomen: Post gastric bypass surgery. 2.4 cm low-attenuation lesion peripherally in hepatic segment 2, stable since previous consistent with benign cyst. No acute findings. Musculoskeletal: Vertebral endplate spurring at multiple levels in the mid and lower thoracic spine. Review of the MIP images confirms the above findings. IMPRESSION: 1. Positive for acute PE with CT evidence of right heart strain (RV/LV Ratio = 1.54) consistent with at least submassive (intermediate risk) PE. The presence of right heart strain has been associated with an increased risk of morbidity and mortality. Please refer to the "Code PE Focused" order set in EPIC. 2. Probable clot in transit across the interatrial septum from right to left atrium, significant systemic embolic risk. Critical Value/emergent results were sent by secure chat at the time of interpretation on 03/22/2023 at 8:44 pm to provider Velna Ochs, who acknowledged these results. 3. Small left pleural effusion. 4. Peripheral somewhat geographic ground-glass opacities in both apices, and in the anterior and lateral  left upper lobe. 5. Aortic Atherosclerosis (ICD10-I70.0) and Emphysema (ICD10-J43.9). Electronically Signed   By: Corlis Leak M.D.   On: 03/22/2023 20:45   VAS US CAROTID  Result Date: 03/22/2023 Carotid Arterial Duplex Study Patient Name:  Caroline Williams  Date of Exam:   03/22/2023 Medical Rec #: [id number]        Accession #:    [id number] Date of Birth: May 22, 1961        Patient Gender: F Patient Age:   62 years Exam Location:  [facility] Procedure:      VAS US CAROTID Referring Phys: Leticia Penna --------------------------------------------------------------------------------  Indications:       Carotid artery disease. Risk Factors:      Hypertension, Diabetes, coronary artery disease. Comparison Study:  No prior study Performing Technologist: Shona Simpson  Examination Guidelines: A complete evaluation includes B-mode imaging, spectral Doppler, color Doppler, and power Doppler as needed of all accessible portions of each vessel. Bilateral testing is considered an integral part of a complete examination. Limited examinations for reoccurring indications may be performed as noted.  Right Carotid Findings: +----------+--------+--------+--------+------------------+------------------+           PSV cm/sEDV cm/sStenosisPlaque DescriptionComments           +----------+--------+--------+--------+------------------+------------------+ CCA Prox  93      13                                                   +----------+--------+--------+--------+------------------+------------------+  ECA       33      5                                          +----------+--------+--------+--------+------------------+--------+ +----------+--------+--------+--------+-------------------+           PSV cm/sEDV cm/sDescribeArm Pressure  (mmHG) +----------+--------+--------+--------+-------------------+ Subclavian132     0                                   +----------+--------+--------+--------+-------------------+ +---------+--------+--+--------+-+ VertebralPSV cm/s31EDV cm/s9 +---------+--------+--+--------+-+   Summary: Right Carotid: Velocities in the right ICA are consistent with a 1-39% stenosis. Left Carotid: Velocities in the left ICA are consistent with a 1-39% stenosis. Vertebrals:  Bilateral vertebral arteries demonstrate antegrade flow. Subclavians: Normal flow hemodynamics were seen in bilateral subclavian              arteries. *See table(s) above for measurements and observations.  Electronically signed by Lemar Livings MD on 03/22/2023 at 6:05:31 PM.    Final    ECHOCARDIOGRAM COMPLETE  Result Date: 03/22/2023    ECHOCARDIOGRAM REPORT   Patient Name:   Caroline Williams Date of Exam: 03/22/2023 Medical Rec #:  [id number]       Height:       65.0 in Accession #:    [id number]      Weight:       215.0 lb Date of Birth:  [birth year]       BSA:          2.040 m Patient Age:    61 years        BP:           142/88 mmHg Patient Gender: F               HR:           110 bpm. Exam Location:  Inpatient Procedure: 2D Echo, Cardiac Doppler, Color Doppler and Intracardiac            Opacification Agent Indications:    Stroke I63.9  History:        Patient has prior history of Echocardiogram examinations, most                 recent 12/28/2022. CAD, Stroke and COPD, Arrythmias:PVC,                 Signs/Symptoms:Chest Pain; Risk Factors:Hypertension, Diabetes,                 Dyslipidemia, Current Smoker and Sleep Apnea. CKD, stage 3.  Sonographer:    [name] RCS Referring Phys: [id number] ASIA B ZIERLE-GHOSH  Sonographer Comments: Image acquisition challenging due to uncooperative patient. IMPRESSIONS  1. There is a mobile clot/thrombus in transit sitting in the interatrial septum.  2. Left ventricular ejection fraction, by  estimation, is 40 to 45%. The left ventricle has mildly decreased function. The left ventricle demonstrates global hypokinesis. Left ventricular diastolic parameters are indeterminate.  3. Right ventricular systolic function is normal. The right ventricular size is normal.  4. Thrombus noted in the left atrium.  5. The mitral valve is normal in structure. No evidence of mitral valve regurgitation. No evidence of mitral stenosis.  6. The aortic valve is normal in structure. Aortic valve regurgitation is not visualized. No aortic stenosis is  image reconstructions and MIPs were obtained to evaluate the vascular anatomy. RADIATION DOSE REDUCTION: This exam was performed according to the departmental dose-optimization program which includes automated exposure control, adjustment of the mA and/or kV according to patient size and/or use of iterative reconstruction technique. CONTRAST:  75mL OMNIPAQUE IOHEXOL 350 MG/ML SOLN COMPARISON:  09/10/2021 FINDINGS: Cardiovascular: SVC patent. Heart size upper limits normal. No pericardial effusion. Curvilinear filling defect in the right atrium, extending  across the interatrial septum into the left atrium. Central pulmonary arteries dilated, RV/LV ratio 1.54. Good contrast opacification of the pulmonary arterial tree. Central, saddle and segmental pulmonary emboli bilaterally, new since previous. Left coronary calcifications. Adequate contrast opacification of the thoracic aorta with no evidence of dissection, aneurysm, or stenosis. There is classic 3-vessel brachiocephalic arch anatomy without proximal stenosis. Minimal scattered calcified plaque in the arch. Mediastinum/Nodes: No mediastinal hematoma, mass, or adenopathy. Lungs/Pleura: Small left pleural effusion, new since previous. No pneumothorax. Pulmonary emphysema. Dependent atelectasis in both lung bases left greater than right. Peripheral somewhat geographic ground-glass opacities in both apices, and in the anterior and lateral left upper lobe. Upper Abdomen: Post gastric bypass surgery. 2.4 cm low-attenuation lesion peripherally in hepatic segment 2, stable since previous consistent with benign cyst. No acute findings. Musculoskeletal: Vertebral endplate spurring at multiple levels in the mid and lower thoracic spine. Review of the MIP images confirms the above findings. IMPRESSION: 1. Positive for acute PE with CT evidence of right heart strain (RV/LV Ratio = 1.54) consistent with at least submassive (intermediate risk) PE. The presence of right heart strain has been associated with an increased risk of morbidity and mortality. Please refer to the "Code PE Focused" order set in EPIC. 2. Probable clot in transit across the interatrial septum from right to left atrium, significant systemic embolic risk. Critical Value/emergent results were sent by secure chat at the time of interpretation on 03/22/2023 at 8:44 pm to provider Velna Ochs, who acknowledged these results. 3. Small left pleural effusion. 4. Peripheral somewhat geographic ground-glass opacities in both apices, and in the anterior and lateral  left upper lobe. 5. Aortic Atherosclerosis (ICD10-I70.0) and Emphysema (ICD10-J43.9). Electronically Signed   By: Corlis Leak M.D.   On: 03/22/2023 20:45   VAS US CAROTID  Result Date: 03/22/2023 Carotid Arterial Duplex Study Patient Name:  Caroline Williams  Date of Exam:   03/22/2023 Medical Rec #: [id number]        Accession #:    [id number] Date of Birth: May 22, 1961        Patient Gender: F Patient Age:   62 years Exam Location:  [facility] Procedure:      VAS US CAROTID Referring Phys: Leticia Penna --------------------------------------------------------------------------------  Indications:       Carotid artery disease. Risk Factors:      Hypertension, Diabetes, coronary artery disease. Comparison Study:  No prior study Performing Technologist: Shona Simpson  Examination Guidelines: A complete evaluation includes B-mode imaging, spectral Doppler, color Doppler, and power Doppler as needed of all accessible portions of each vessel. Bilateral testing is considered an integral part of a complete examination. Limited examinations for reoccurring indications may be performed as noted.  Right Carotid Findings: +----------+--------+--------+--------+------------------+------------------+           PSV cm/sEDV cm/sStenosisPlaque DescriptionComments           +----------+--------+--------+--------+------------------+------------------+ CCA Prox  93      13                                                   +----------+--------+--------+--------+------------------+------------------+

## 2023-03-23 NOTE — Progress Notes (Signed)
Speech Language Pathology Treatment: Cognitive-Linquistic  Patient Details Name: Caroline Williams MRN: [id number] DOB: [birth year] Today's Date: 03/23/2023 Time: 9528-4132 SLP Time Calculation (min) (ACUTE ONLY): 30 min  Assessment / Plan / Recommendation Clinical Impression  Patient seen by SLP for skilled intervention focused on cognitive-linguistic function. Patient was asleep with no family at her bedside when SLP entered the room. She was difficult to rouse, but eventually awoke and remained attentive throughout the rest of the session. SLP provided patient with counseling as she became emotional about her current state of functioning and frustration with her inability to communicate. Patient tearfully stated, "It makes me very upset because, listen, because its very, in my health.Marland KitchenMarland KitchenI felt like a child." She was motivated and cooperative with skilled interventions provided. She continues to present with a moderate expressive aphasia marked neologisms, perseveration, and semantic paraphasias. Repetition continues to be impaired. She displays inconsistent awareness of her errors. When presented with receptive tasks (I.e., pointing to an object) patient had no inaccuracies. When faced with confrontational naming tasks, patient had about 60% accuracy with verbal cueing. Phonemic cues were most effective in eliciting a correct response. Patient would benefit from continued ST during this hospitalization and would benefit from continued ST at her next venue of care.   HPI HPI: NATISHA TRZCINSKI is a 62 y.o. female who presented the ED with a chief complaint of aphasia. MRI 10/8: "Patchy acute left MCA distribution infarct involving the left  frontal, parietal, and temporal lobes. No associated hemorrhage or significant mass effect." CXR 10/7: " Minor atelectasis at the left lung base. No confluent airspace disease, large pleural effusion or pneumothorax."  Pt with medical history significant of anxiety, bipolar  1 disorder, coronary artery disease, COPD, depression, diabetes mellitus type 2, history of Roux-en-Y, GERD, hypothyroidism, hypertension, neuropathy, and more.      SLP Plan  Continue with current plan of care      Recommendations for follow up therapy are one component of a multi-disciplinary discharge planning process, led by the attending physician.  Recommendations may be updated based on patient status, additional functional criteria and insurance authorization.    Recommendations                     Oral care BID   Frequent or constant Supervision/Assistance Aphasia (R47.01)     Continue with current plan of care     Marline Backbone, B.S., Speech Therapy Student   03/23/2023, 5:23 PM

## 2023-03-23 NOTE — Progress Notes (Signed)
Lower extremity venous duplex completed. Please see CV Procedures for preliminary results.  Initial findings reported to Chase Picket, Charity fundraiser.  Shona Simpson, RVT 03/23/23 3:18 PM

## 2023-03-23 NOTE — Progress Notes (Signed)
PHARMACY - ANTICOAGULATION CONSULT NOTE  Pharmacy Consult for heparin  Indication:  coronary thrombus  + acute PE w/ RHS  Allergies  Allergen Reactions   Asa [Aspirin] Anaphylaxis, Shortness Of Breath and Other (See Comments)    Respiratory distress  Pt states she has had Toradol several times without any reactions   Bee Venom Anaphylaxis   Sulfa Antibiotics Anaphylaxis   Nsaids Other (See Comments)    History of gastric bypass   Other Other (See Comments)    No blood products.  Patient did  Request that only albumin or albumin-containing products may be administered   Repatha [Evolocumab] Other (See Comments)    Unknown reaction    Patient Measurements: Height: 5\' 5"  (165.1 cm) Weight: 97.5 kg (215 lb) (weight from 8/24) IBW/kg (Calculated) : 57 Heparin Dosing Weight: 79 kg  Vital Signs: Temp: 98.3 F (36.8 C) (10/09 0838) Temp Source: Oral (10/09 0838) BP: 136/88 (10/09 0838) Pulse Rate: 97 (10/09 0330)  Labs: Recent Labs    03/21/23 1746 03/21/23 1750 03/22/23 1211 03/22/23 1729 03/22/23 1817 03/23/23 0041 03/23/23 0517 03/23/23 0934  HGB 10.4* 11.9* 9.3*  --   --   --  10.2*  --   HCT 33.9* 35.0* 30.1*  --   --   --  33.8*  --   PLT 366  --  312  --   --   --  326  --   APTT 30  --   --   --   --   --   --   --   LABPROT 13.6  --   --   --   --   --   --   --   INR 1.0  --   --   --   --   --   --   --   HEPARINUNFRC  --   --   --   --   --  0.35  --  0.20*  CREATININE 1.68* 1.60* 1.56*  --   --   --  1.53*  --   TROPONINIHS  --   --   --  1,100* 1,027*  --   --   --     Estimated Creatinine Clearance: 43.5 mL/min (A) (by C-G formula based on SCr of 1.53 mg/dL (H)).   Medical History: Past Medical History:  Diagnosis Date   Anxiety    Arthritis    Asthma    Bipolar 1 disorder (HCC)    Breast discharge    Breast lump    Breast pain    CAD (coronary artery disease) 12/03/2022    o Cath 2008: no CAD   o CCTA 10/05/19: CAC score 49, 88th  percentile, nonobstructive CAD (LM 1-24 mid and dist)  o Myoview 12/31/20: EF 45, no ischemia or infarction, low risk    Chronic pain    on MS Contin and oxycodone   COPD (chronic obstructive pulmonary disease) (HCC)    Depression    Diabetes mellitus    diet and exercise controlled   Dysrhythmia    HR in 30s on metoprolol   Escherichia coli (E. coli) infection    Fever    GERD (gastroesophageal reflux disease)    History of chest pain    History of knee replacement, total    Hypertension    Hypothyroidism    N&V (nausea and vomiting)    Peripheral neuropathy    Right foot no sensation   Sciatica  Sleep apnea    does not use CPAP   Thyroid disease    Wears glasses     Assessment: 61YOF with a PMH of CAD and recent GI hemorrhage not on [facility] PTA who is admitted with left MCA stroke with concern for right hemisphere involvement and concern for central embolic stroke. Patient now found to have left atrial mass with PFO and mass extending into the right atrium. CT positive for acute PE with CT evidence of right heart strain (RV/LV Ratio = 1.54). Pharmacy consulted to dose heparin.  No bolus per MD and will have lower therapeutic goal due to CVA. Heparin level below goal on 1000 units/hr. No issues with heparin infusion reported. CBC stable.   Goal of Therapy:  Heparin level 0.3-0.5 units/ml Monitor platelets by anticoagulation protocol: Yes   Plan:  Increase heparin infusion to 1150 units/hr Check heparin level in 8 hours and daily while on heparin Continue to monitor H&H and platelets  Thank you for allowing pharmacy to be a part of this patient's care.  Thelma Barge, PharmD Clinical Pharmacist

## 2023-03-23 NOTE — Plan of Care (Signed)
  Problem: Respiratory: Goal: Ability to maintain adequate ventilation will improve Outcome: Progressing   Problem: Pain Management: Goal: Pain level will decrease Outcome: Progressing   Problem: Skin Integrity: Goal: Will show signs of wound healing Outcome: Progressing   Problem: Coping: Goal: Will verbalize positive feelings about self Outcome: Progressing   Problem: Nutrition: Goal: Risk of aspiration will decrease Outcome: Progressing

## 2023-03-23 NOTE — Progress Notes (Addendum)
Cardiology Progress Note  Patient ID: Caroline Williams MRN: [id number] DOB: 1960-09-14 Date of Encounter: 03/23/2023  Primary Cardiologist: Reatha Harps, MD  Subjective   Chief Complaint: None.   HPI: Minimally interactive.  Will not answer questions.  Will not follow commands.  Drowsy.  ROS:  All other ROS reviewed and negative. Pertinent positives noted in the HPI.     Inpatient Medications  Scheduled Meds:  brexpiprazole  2 mg Oral Daily   cilostazol  50 mg Oral BID   clopidogrel  75 mg Oral Daily   dapagliflozin propanediol  5 mg Oral Daily   doxepin  75 mg Oral QHS   fesoterodine  4 mg Oral Daily   levothyroxine  150 mcg Oral Q0600   morphine  15 mg Oral Q12H   pantoprazole  40 mg Oral Daily   rOPINIRole  2 mg Oral QHS   rosuvastatin  40 mg Oral Daily   sodium chloride flush  3 mL Intravenous Once   umeclidinium-vilanterol  1 puff Inhalation Daily   vortioxetine HBr  20 mg Oral Daily   Continuous Infusions:  sodium chloride 75 mL/hr at 03/23/23 0221   heparin 1,000 Units/hr (03/22/23 1800)   PRN Meds: acetaminophen **OR** acetaminophen, albuterol, ALPRAZolam, ondansetron **OR** ondansetron (ZOFRAN) IV, oxyCODONE   Vital Signs   Vitals:   03/22/23 2331 03/22/23 2350 03/23/23 0330 03/23/23 0338  BP: (!) 146/87 118/85 (!) 144/115 (!) 160/98  Pulse:   97   Resp: 18 19 18    Temp:  97.9 F (36.6 C) 98.1 F (36.7 C)   TempSrc:  Oral Oral   SpO2: 100% 92% 93%   Weight:      Height:        Intake/Output Summary (Last 24 hours) at 03/23/2023 0825 Last data filed at 03/23/2023 0400 Gross per 24 hour  Intake 1618.49 ml  Output 830 ml  Net 788.49 ml      03/22/2023    3:00 PM 02/05/2023    8:37 AM 01/31/2023    9:38 AM  Last 3 Weights  Weight (lbs) 215 lb 215 lb 227 lb 4.7 oz  Weight (kg) 97.523 kg 97.523 kg 103.1 kg      Telemetry  Overnight telemetry shows ST 100s, which I personally reviewed.   Physical Exam   Vitals:   03/22/23 2331 03/22/23  2350 03/23/23 0330 03/23/23 0338  BP: (!) 146/87 118/85 (!) 144/115 (!) 160/98  Pulse:   97   Resp: 18 19 18    Temp:  97.9 F (36.6 C) 98.1 F (36.7 C)   TempSrc:  Oral Oral   SpO2: 100% 92% 93%   Weight:      Height:        Intake/Output Summary (Last 24 hours) at 03/23/2023 0825 Last data filed at 03/23/2023 0400 Gross per 24 hour  Intake 1618.49 ml  Output 830 ml  Net 788.49 ml       03/22/2023    3:00 PM 02/05/2023    8:37 AM 01/31/2023    9:38 AM  Last 3 Weights  Weight (lbs) 215 lb 215 lb 227 lb 4.7 oz  Weight (kg) 97.523 kg 97.523 kg 103.1 kg    Body mass index is 35.78 kg/m.  General: Well nourished, well developed, in no acute distress Head: Atraumatic, normal size  Eyes: PEERLA, EOMI  Neck: Supple, no JVD Endocrine: No thryomegaly Cardiac: Normal S1, S2; tachycardia, no murmurs Lungs: Clear to auscultation bilaterally, no wheezing, rhonchi or rales  Cardiology Progress Note  Patient ID: Caroline Williams MRN: [id number] DOB: 1960-09-14 Date of Encounter: 03/23/2023  Primary Cardiologist: Reatha Harps, MD  Subjective   Chief Complaint: None.   HPI: Minimally interactive.  Will not answer questions.  Will not follow commands.  Drowsy.  ROS:  All other ROS reviewed and negative. Pertinent positives noted in the HPI.     Inpatient Medications  Scheduled Meds:  brexpiprazole  2 mg Oral Daily   cilostazol  50 mg Oral BID   clopidogrel  75 mg Oral Daily   dapagliflozin propanediol  5 mg Oral Daily   doxepin  75 mg Oral QHS   fesoterodine  4 mg Oral Daily   levothyroxine  150 mcg Oral Q0600   morphine  15 mg Oral Q12H   pantoprazole  40 mg Oral Daily   rOPINIRole  2 mg Oral QHS   rosuvastatin  40 mg Oral Daily   sodium chloride flush  3 mL Intravenous Once   umeclidinium-vilanterol  1 puff Inhalation Daily   vortioxetine HBr  20 mg Oral Daily   Continuous Infusions:  sodium chloride 75 mL/hr at 03/23/23 0221   heparin 1,000 Units/hr (03/22/23 1800)   PRN Meds: acetaminophen **OR** acetaminophen, albuterol, ALPRAZolam, ondansetron **OR** ondansetron (ZOFRAN) IV, oxyCODONE   Vital Signs   Vitals:   03/22/23 2331 03/22/23 2350 03/23/23 0330 03/23/23 0338  BP: (!) 146/87 118/85 (!) 144/115 (!) 160/98  Pulse:   97   Resp: 18 19 18    Temp:  97.9 F (36.6 C) 98.1 F (36.7 C)   TempSrc:  Oral Oral   SpO2: 100% 92% 93%   Weight:      Height:        Intake/Output Summary (Last 24 hours) at 03/23/2023 0825 Last data filed at 03/23/2023 0400 Gross per 24 hour  Intake 1618.49 ml  Output 830 ml  Net 788.49 ml      03/22/2023    3:00 PM 02/05/2023    8:37 AM 01/31/2023    9:38 AM  Last 3 Weights  Weight (lbs) 215 lb 215 lb 227 lb 4.7 oz  Weight (kg) 97.523 kg 97.523 kg 103.1 kg      Telemetry  Overnight telemetry shows ST 100s, which I personally reviewed.   Physical Exam   Vitals:   03/22/23 2331 03/22/23  2350 03/23/23 0330 03/23/23 0338  BP: (!) 146/87 118/85 (!) 144/115 (!) 160/98  Pulse:   97   Resp: 18 19 18    Temp:  97.9 F (36.6 C) 98.1 F (36.7 C)   TempSrc:  Oral Oral   SpO2: 100% 92% 93%   Weight:      Height:        Intake/Output Summary (Last 24 hours) at 03/23/2023 0825 Last data filed at 03/23/2023 0400 Gross per 24 hour  Intake 1618.49 ml  Output 830 ml  Net 788.49 ml       03/22/2023    3:00 PM 02/05/2023    8:37 AM 01/31/2023    9:38 AM  Last 3 Weights  Weight (lbs) 215 lb 215 lb 227 lb 4.7 oz  Weight (kg) 97.523 kg 97.523 kg 103.1 kg    Body mass index is 35.78 kg/m.  General: Well nourished, well developed, in no acute distress Head: Atraumatic, normal size  Eyes: PEERLA, EOMI  Neck: Supple, no JVD Endocrine: No thryomegaly Cardiac: Normal S1, S2; tachycardia, no murmurs Lungs: Clear to auscultation bilaterally, no wheezing, rhonchi or rales  spurring at multiple levels in the mid and lower thoracic spine. Review of the MIP images confirms the above findings. IMPRESSION: 1. Positive for acute PE with CT evidence of right heart strain (RV/LV Ratio = 1.54) consistent with at least submassive (intermediate risk) PE. The presence of right heart strain has been associated with an increased risk of morbidity and mortality. Please refer to the "Code PE Focused" order set in EPIC. 2. Probable clot in transit across the interatrial septum from right to left atrium, significant systemic embolic risk. Critical Value/emergent results were sent by secure chat at the time of interpretation on 03/22/2023 at 8:44 pm to provider Velna Ochs, who acknowledged these results. 3. Small left pleural effusion. 4. Peripheral somewhat geographic ground-glass opacities in both apices, and in the anterior and lateral left upper lobe. 5. Aortic Atherosclerosis (ICD10-I70.0) and Emphysema (ICD10-J43.9). Electronically Signed   By: Corlis Leak M.D.   On: 03/22/2023 20:45   VAS US CAROTID  Result Date: 03/22/2023 Carotid Arterial Duplex Study Patient Name:  Caroline Williams  Date of Exam:   03/22/2023 Medical Rec #: [id number]        Accession #:    [id number] Date of Birth: 02-27-1961        Patient Gender: F Patient Age:   89 years Exam Location:  [facility] Procedure:      VAS US CAROTID Referring Phys: Leticia Penna --------------------------------------------------------------------------------  Indications:       Carotid artery disease. Risk Factors:      Hypertension,  Diabetes, coronary artery disease. Comparison Study:  No prior study Performing Technologist: Shona Simpson  Examination Guidelines: A complete evaluation includes B-mode imaging, spectral Doppler, color Doppler, and power Doppler as needed of all accessible portions of each vessel. Bilateral testing is considered an integral part of a complete examination. Limited examinations for reoccurring indications may be performed as noted.  Right Carotid Findings: +----------+--------+--------+--------+------------------+------------------+           PSV cm/sEDV cm/sStenosisPlaque DescriptionComments           +----------+--------+--------+--------+------------------+------------------+ CCA Prox  93      13                                                   +----------+--------+--------+--------+------------------+------------------+ CCA Distal30      10                                intimal thickening +----------+--------+--------+--------+------------------+------------------+ ICA Prox  23      11      1-39%   smooth                               +----------+--------+--------+--------+------------------+------------------+ ICA Mid   39      20                                                   +----------+--------+--------+--------+------------------+------------------+ ICA Distal64      26                                                   +----------+--------+--------+--------+------------------+------------------+  spurring at multiple levels in the mid and lower thoracic spine. Review of the MIP images confirms the above findings. IMPRESSION: 1. Positive for acute PE with CT evidence of right heart strain (RV/LV Ratio = 1.54) consistent with at least submassive (intermediate risk) PE. The presence of right heart strain has been associated with an increased risk of morbidity and mortality. Please refer to the "Code PE Focused" order set in EPIC. 2. Probable clot in transit across the interatrial septum from right to left atrium, significant systemic embolic risk. Critical Value/emergent results were sent by secure chat at the time of interpretation on 03/22/2023 at 8:44 pm to provider Velna Ochs, who acknowledged these results. 3. Small left pleural effusion. 4. Peripheral somewhat geographic ground-glass opacities in both apices, and in the anterior and lateral left upper lobe. 5. Aortic Atherosclerosis (ICD10-I70.0) and Emphysema (ICD10-J43.9). Electronically Signed   By: Corlis Leak M.D.   On: 03/22/2023 20:45   VAS US CAROTID  Result Date: 03/22/2023 Carotid Arterial Duplex Study Patient Name:  Caroline Williams  Date of Exam:   03/22/2023 Medical Rec #: [id number]        Accession #:    [id number] Date of Birth: 02-27-1961        Patient Gender: F Patient Age:   89 years Exam Location:  [facility] Procedure:      VAS US CAROTID Referring Phys: Leticia Penna --------------------------------------------------------------------------------  Indications:       Carotid artery disease. Risk Factors:      Hypertension,  Diabetes, coronary artery disease. Comparison Study:  No prior study Performing Technologist: Shona Simpson  Examination Guidelines: A complete evaluation includes B-mode imaging, spectral Doppler, color Doppler, and power Doppler as needed of all accessible portions of each vessel. Bilateral testing is considered an integral part of a complete examination. Limited examinations for reoccurring indications may be performed as noted.  Right Carotid Findings: +----------+--------+--------+--------+------------------+------------------+           PSV cm/sEDV cm/sStenosisPlaque DescriptionComments           +----------+--------+--------+--------+------------------+------------------+ CCA Prox  93      13                                                   +----------+--------+--------+--------+------------------+------------------+ CCA Distal30      10                                intimal thickening +----------+--------+--------+--------+------------------+------------------+ ICA Prox  23      11      1-39%   smooth                               +----------+--------+--------+--------+------------------+------------------+ ICA Mid   39      20                                                   +----------+--------+--------+--------+------------------+------------------+ ICA Distal64      26                                                   +----------+--------+--------+--------+------------------+------------------+  Cardiology Progress Note  Patient ID: Caroline Williams MRN: [id number] DOB: 1960-09-14 Date of Encounter: 03/23/2023  Primary Cardiologist: Reatha Harps, MD  Subjective   Chief Complaint: None.   HPI: Minimally interactive.  Will not answer questions.  Will not follow commands.  Drowsy.  ROS:  All other ROS reviewed and negative. Pertinent positives noted in the HPI.     Inpatient Medications  Scheduled Meds:  brexpiprazole  2 mg Oral Daily   cilostazol  50 mg Oral BID   clopidogrel  75 mg Oral Daily   dapagliflozin propanediol  5 mg Oral Daily   doxepin  75 mg Oral QHS   fesoterodine  4 mg Oral Daily   levothyroxine  150 mcg Oral Q0600   morphine  15 mg Oral Q12H   pantoprazole  40 mg Oral Daily   rOPINIRole  2 mg Oral QHS   rosuvastatin  40 mg Oral Daily   sodium chloride flush  3 mL Intravenous Once   umeclidinium-vilanterol  1 puff Inhalation Daily   vortioxetine HBr  20 mg Oral Daily   Continuous Infusions:  sodium chloride 75 mL/hr at 03/23/23 0221   heparin 1,000 Units/hr (03/22/23 1800)   PRN Meds: acetaminophen **OR** acetaminophen, albuterol, ALPRAZolam, ondansetron **OR** ondansetron (ZOFRAN) IV, oxyCODONE   Vital Signs   Vitals:   03/22/23 2331 03/22/23 2350 03/23/23 0330 03/23/23 0338  BP: (!) 146/87 118/85 (!) 144/115 (!) 160/98  Pulse:   97   Resp: 18 19 18    Temp:  97.9 F (36.6 C) 98.1 F (36.7 C)   TempSrc:  Oral Oral   SpO2: 100% 92% 93%   Weight:      Height:        Intake/Output Summary (Last 24 hours) at 03/23/2023 0825 Last data filed at 03/23/2023 0400 Gross per 24 hour  Intake 1618.49 ml  Output 830 ml  Net 788.49 ml      03/22/2023    3:00 PM 02/05/2023    8:37 AM 01/31/2023    9:38 AM  Last 3 Weights  Weight (lbs) 215 lb 215 lb 227 lb 4.7 oz  Weight (kg) 97.523 kg 97.523 kg 103.1 kg      Telemetry  Overnight telemetry shows ST 100s, which I personally reviewed.   Physical Exam   Vitals:   03/22/23 2331 03/22/23  2350 03/23/23 0330 03/23/23 0338  BP: (!) 146/87 118/85 (!) 144/115 (!) 160/98  Pulse:   97   Resp: 18 19 18    Temp:  97.9 F (36.6 C) 98.1 F (36.7 C)   TempSrc:  Oral Oral   SpO2: 100% 92% 93%   Weight:      Height:        Intake/Output Summary (Last 24 hours) at 03/23/2023 0825 Last data filed at 03/23/2023 0400 Gross per 24 hour  Intake 1618.49 ml  Output 830 ml  Net 788.49 ml       03/22/2023    3:00 PM 02/05/2023    8:37 AM 01/31/2023    9:38 AM  Last 3 Weights  Weight (lbs) 215 lb 215 lb 227 lb 4.7 oz  Weight (kg) 97.523 kg 97.523 kg 103.1 kg    Body mass index is 35.78 kg/m.  General: Well nourished, well developed, in no acute distress Head: Atraumatic, normal size  Eyes: PEERLA, EOMI  Neck: Supple, no JVD Endocrine: No thryomegaly Cardiac: Normal S1, S2; tachycardia, no murmurs Lungs: Clear to auscultation bilaterally, no wheezing, rhonchi or rales  spurring at multiple levels in the mid and lower thoracic spine. Review of the MIP images confirms the above findings. IMPRESSION: 1. Positive for acute PE with CT evidence of right heart strain (RV/LV Ratio = 1.54) consistent with at least submassive (intermediate risk) PE. The presence of right heart strain has been associated with an increased risk of morbidity and mortality. Please refer to the "Code PE Focused" order set in EPIC. 2. Probable clot in transit across the interatrial septum from right to left atrium, significant systemic embolic risk. Critical Value/emergent results were sent by secure chat at the time of interpretation on 03/22/2023 at 8:44 pm to provider Velna Ochs, who acknowledged these results. 3. Small left pleural effusion. 4. Peripheral somewhat geographic ground-glass opacities in both apices, and in the anterior and lateral left upper lobe. 5. Aortic Atherosclerosis (ICD10-I70.0) and Emphysema (ICD10-J43.9). Electronically Signed   By: Corlis Leak M.D.   On: 03/22/2023 20:45   VAS US CAROTID  Result Date: 03/22/2023 Carotid Arterial Duplex Study Patient Name:  Caroline Williams  Date of Exam:   03/22/2023 Medical Rec #: [id number]        Accession #:    [id number] Date of Birth: 02-27-1961        Patient Gender: F Patient Age:   89 years Exam Location:  [facility] Procedure:      VAS US CAROTID Referring Phys: Leticia Penna --------------------------------------------------------------------------------  Indications:       Carotid artery disease. Risk Factors:      Hypertension,  Diabetes, coronary artery disease. Comparison Study:  No prior study Performing Technologist: Shona Simpson  Examination Guidelines: A complete evaluation includes B-mode imaging, spectral Doppler, color Doppler, and power Doppler as needed of all accessible portions of each vessel. Bilateral testing is considered an integral part of a complete examination. Limited examinations for reoccurring indications may be performed as noted.  Right Carotid Findings: +----------+--------+--------+--------+------------------+------------------+           PSV cm/sEDV cm/sStenosisPlaque DescriptionComments           +----------+--------+--------+--------+------------------+------------------+ CCA Prox  93      13                                                   +----------+--------+--------+--------+------------------+------------------+ CCA Distal30      10                                intimal thickening +----------+--------+--------+--------+------------------+------------------+ ICA Prox  23      11      1-39%   smooth                               +----------+--------+--------+--------+------------------+------------------+ ICA Mid   39      20                                                   +----------+--------+--------+--------+------------------+------------------+ ICA Distal64      26                                                   +----------+--------+--------+--------+------------------+------------------+  Cardiology Progress Note  Patient ID: Caroline Williams MRN: [id number] DOB: 1960-09-14 Date of Encounter: 03/23/2023  Primary Cardiologist: Reatha Harps, MD  Subjective   Chief Complaint: None.   HPI: Minimally interactive.  Will not answer questions.  Will not follow commands.  Drowsy.  ROS:  All other ROS reviewed and negative. Pertinent positives noted in the HPI.     Inpatient Medications  Scheduled Meds:  brexpiprazole  2 mg Oral Daily   cilostazol  50 mg Oral BID   clopidogrel  75 mg Oral Daily   dapagliflozin propanediol  5 mg Oral Daily   doxepin  75 mg Oral QHS   fesoterodine  4 mg Oral Daily   levothyroxine  150 mcg Oral Q0600   morphine  15 mg Oral Q12H   pantoprazole  40 mg Oral Daily   rOPINIRole  2 mg Oral QHS   rosuvastatin  40 mg Oral Daily   sodium chloride flush  3 mL Intravenous Once   umeclidinium-vilanterol  1 puff Inhalation Daily   vortioxetine HBr  20 mg Oral Daily   Continuous Infusions:  sodium chloride 75 mL/hr at 03/23/23 0221   heparin 1,000 Units/hr (03/22/23 1800)   PRN Meds: acetaminophen **OR** acetaminophen, albuterol, ALPRAZolam, ondansetron **OR** ondansetron (ZOFRAN) IV, oxyCODONE   Vital Signs   Vitals:   03/22/23 2331 03/22/23 2350 03/23/23 0330 03/23/23 0338  BP: (!) 146/87 118/85 (!) 144/115 (!) 160/98  Pulse:   97   Resp: 18 19 18    Temp:  97.9 F (36.6 C) 98.1 F (36.7 C)   TempSrc:  Oral Oral   SpO2: 100% 92% 93%   Weight:      Height:        Intake/Output Summary (Last 24 hours) at 03/23/2023 0825 Last data filed at 03/23/2023 0400 Gross per 24 hour  Intake 1618.49 ml  Output 830 ml  Net 788.49 ml      03/22/2023    3:00 PM 02/05/2023    8:37 AM 01/31/2023    9:38 AM  Last 3 Weights  Weight (lbs) 215 lb 215 lb 227 lb 4.7 oz  Weight (kg) 97.523 kg 97.523 kg 103.1 kg      Telemetry  Overnight telemetry shows ST 100s, which I personally reviewed.   Physical Exam   Vitals:   03/22/23 2331 03/22/23  2350 03/23/23 0330 03/23/23 0338  BP: (!) 146/87 118/85 (!) 144/115 (!) 160/98  Pulse:   97   Resp: 18 19 18    Temp:  97.9 F (36.6 C) 98.1 F (36.7 C)   TempSrc:  Oral Oral   SpO2: 100% 92% 93%   Weight:      Height:        Intake/Output Summary (Last 24 hours) at 03/23/2023 0825 Last data filed at 03/23/2023 0400 Gross per 24 hour  Intake 1618.49 ml  Output 830 ml  Net 788.49 ml       03/22/2023    3:00 PM 02/05/2023    8:37 AM 01/31/2023    9:38 AM  Last 3 Weights  Weight (lbs) 215 lb 215 lb 227 lb 4.7 oz  Weight (kg) 97.523 kg 97.523 kg 103.1 kg    Body mass index is 35.78 kg/m.  General: Well nourished, well developed, in no acute distress Head: Atraumatic, normal size  Eyes: PEERLA, EOMI  Neck: Supple, no JVD Endocrine: No thryomegaly Cardiac: Normal S1, S2; tachycardia, no murmurs Lungs: Clear to auscultation bilaterally, no wheezing, rhonchi or rales  ECHOCARDIOGRAM REPORT   Patient Name:   Caroline Williams Date of Exam: 03/22/2023 Medical Rec #:  [id number]       Height:       65.0 in Accession #:    [id number]      Weight:       215.0 lb Date of Birth:  [birth year]       BSA:          2.040 m Patient Age:    61 years        BP:           142/88 mmHg Patient Gender: F               HR:           110 bpm. Exam Location:  Inpatient Procedure: 2D Echo, Cardiac Doppler, Color Doppler and Intracardiac            Opacification Agent Indications:    Stroke I63.9  History:        Patient has prior history of Echocardiogram examinations, most                 recent 12/28/2022. CAD, Stroke and COPD, Arrythmias:PVC,                 Signs/Symptoms:Chest Pain; Risk Factors:Hypertension, Diabetes,                 Dyslipidemia, Current Smoker and Sleep Apnea. CKD, stage 3.  Sonographer:    [name] RCS Referring Phys: [id number] ASIA B ZIERLE-GHOSH  Sonographer Comments: Image acquisition challenging due to uncooperative patient. IMPRESSIONS  1. There is a mobile clot/thrombus in transit sitting in the interatrial septum.  2. Left ventricular ejection fraction, by estimation, is 40 to 45%. The left ventricle has mildly decreased function. The left ventricle demonstrates global hypokinesis. Left ventricular diastolic parameters are indeterminate.  3. Right ventricular systolic function is normal. The right ventricular size is normal.  4. Thrombus noted in the left atrium.  5. The mitral valve is normal in structure. No evidence of mitral valve regurgitation. No evidence of mitral stenosis.  6. The aortic valve is normal in structure. Aortic valve regurgitation is not visualized. No aortic stenosis is present.  7. There is borderline dilatation of the ascending aorta, measuring 36 mm.  8. The inferior vena cava is normal in size with greater  than 50% respiratory variability, suggesting right atrial pressure of 3 mmHg.  9. Thrombus seen in the right atrium. FINDINGS  Left Ventricle: Left ventricular ejection fraction, by estimation, is 40 to 45%. The left ventricle has mildly decreased function. The left ventricle demonstrates global hypokinesis. Definity contrast agent was given IV to delineate the left ventricular  endocardial borders. The left ventricular internal cavity size was normal in size. There is no left ventricular hypertrophy. Left ventricular diastolic parameters are indeterminate. Right Ventricle: The right ventricular size is normal. No increase in right ventricular wall thickness. Right ventricular systolic function is normal. Left Atrium: Thrombus noted in the left atrium. Left atrial size was normal in size. Right Atrium: Thrombus seen in the right atrium. Right atrial size was normal in size. Pericardium: There is no evidence of pericardial effusion. Mitral Valve: The mitral valve is normal in structure. No evidence of mitral valve regurgitation. No evidence of mitral valve stenosis. Tricuspid Valve: The tricuspid valve is normal in structure. Tricuspid valve regurgitation is not demonstrated. No evidence of tricuspid stenosis. Aortic Valve: The aortic valve is normal  Cardiology Progress Note  Patient ID: Caroline Williams MRN: [id number] DOB: 1960-09-14 Date of Encounter: 03/23/2023  Primary Cardiologist: Reatha Harps, MD  Subjective   Chief Complaint: None.   HPI: Minimally interactive.  Will not answer questions.  Will not follow commands.  Drowsy.  ROS:  All other ROS reviewed and negative. Pertinent positives noted in the HPI.     Inpatient Medications  Scheduled Meds:  brexpiprazole  2 mg Oral Daily   cilostazol  50 mg Oral BID   clopidogrel  75 mg Oral Daily   dapagliflozin propanediol  5 mg Oral Daily   doxepin  75 mg Oral QHS   fesoterodine  4 mg Oral Daily   levothyroxine  150 mcg Oral Q0600   morphine  15 mg Oral Q12H   pantoprazole  40 mg Oral Daily   rOPINIRole  2 mg Oral QHS   rosuvastatin  40 mg Oral Daily   sodium chloride flush  3 mL Intravenous Once   umeclidinium-vilanterol  1 puff Inhalation Daily   vortioxetine HBr  20 mg Oral Daily   Continuous Infusions:  sodium chloride 75 mL/hr at 03/23/23 0221   heparin 1,000 Units/hr (03/22/23 1800)   PRN Meds: acetaminophen **OR** acetaminophen, albuterol, ALPRAZolam, ondansetron **OR** ondansetron (ZOFRAN) IV, oxyCODONE   Vital Signs   Vitals:   03/22/23 2331 03/22/23 2350 03/23/23 0330 03/23/23 0338  BP: (!) 146/87 118/85 (!) 144/115 (!) 160/98  Pulse:   97   Resp: 18 19 18    Temp:  97.9 F (36.6 C) 98.1 F (36.7 C)   TempSrc:  Oral Oral   SpO2: 100% 92% 93%   Weight:      Height:        Intake/Output Summary (Last 24 hours) at 03/23/2023 0825 Last data filed at 03/23/2023 0400 Gross per 24 hour  Intake 1618.49 ml  Output 830 ml  Net 788.49 ml      03/22/2023    3:00 PM 02/05/2023    8:37 AM 01/31/2023    9:38 AM  Last 3 Weights  Weight (lbs) 215 lb 215 lb 227 lb 4.7 oz  Weight (kg) 97.523 kg 97.523 kg 103.1 kg      Telemetry  Overnight telemetry shows ST 100s, which I personally reviewed.   Physical Exam   Vitals:   03/22/23 2331 03/22/23  2350 03/23/23 0330 03/23/23 0338  BP: (!) 146/87 118/85 (!) 144/115 (!) 160/98  Pulse:   97   Resp: 18 19 18    Temp:  97.9 F (36.6 C) 98.1 F (36.7 C)   TempSrc:  Oral Oral   SpO2: 100% 92% 93%   Weight:      Height:        Intake/Output Summary (Last 24 hours) at 03/23/2023 0825 Last data filed at 03/23/2023 0400 Gross per 24 hour  Intake 1618.49 ml  Output 830 ml  Net 788.49 ml       03/22/2023    3:00 PM 02/05/2023    8:37 AM 01/31/2023    9:38 AM  Last 3 Weights  Weight (lbs) 215 lb 215 lb 227 lb 4.7 oz  Weight (kg) 97.523 kg 97.523 kg 103.1 kg    Body mass index is 35.78 kg/m.  General: Well nourished, well developed, in no acute distress Head: Atraumatic, normal size  Eyes: PEERLA, EOMI  Neck: Supple, no JVD Endocrine: No thryomegaly Cardiac: Normal S1, S2; tachycardia, no murmurs Lungs: Clear to auscultation bilaterally, no wheezing, rhonchi or rales  spurring at multiple levels in the mid and lower thoracic spine. Review of the MIP images confirms the above findings. IMPRESSION: 1. Positive for acute PE with CT evidence of right heart strain (RV/LV Ratio = 1.54) consistent with at least submassive (intermediate risk) PE. The presence of right heart strain has been associated with an increased risk of morbidity and mortality. Please refer to the "Code PE Focused" order set in EPIC. 2. Probable clot in transit across the interatrial septum from right to left atrium, significant systemic embolic risk. Critical Value/emergent results were sent by secure chat at the time of interpretation on 03/22/2023 at 8:44 pm to provider Velna Ochs, who acknowledged these results. 3. Small left pleural effusion. 4. Peripheral somewhat geographic ground-glass opacities in both apices, and in the anterior and lateral left upper lobe. 5. Aortic Atherosclerosis (ICD10-I70.0) and Emphysema (ICD10-J43.9). Electronically Signed   By: Corlis Leak M.D.   On: 03/22/2023 20:45   VAS US CAROTID  Result Date: 03/22/2023 Carotid Arterial Duplex Study Patient Name:  Caroline Williams  Date of Exam:   03/22/2023 Medical Rec #: [id number]        Accession #:    [id number] Date of Birth: 02-27-1961        Patient Gender: F Patient Age:   89 years Exam Location:  [facility] Procedure:      VAS US CAROTID Referring Phys: Leticia Penna --------------------------------------------------------------------------------  Indications:       Carotid artery disease. Risk Factors:      Hypertension,  Diabetes, coronary artery disease. Comparison Study:  No prior study Performing Technologist: Shona Simpson  Examination Guidelines: A complete evaluation includes B-mode imaging, spectral Doppler, color Doppler, and power Doppler as needed of all accessible portions of each vessel. Bilateral testing is considered an integral part of a complete examination. Limited examinations for reoccurring indications may be performed as noted.  Right Carotid Findings: +----------+--------+--------+--------+------------------+------------------+           PSV cm/sEDV cm/sStenosisPlaque DescriptionComments           +----------+--------+--------+--------+------------------+------------------+ CCA Prox  93      13                                                   +----------+--------+--------+--------+------------------+------------------+ CCA Distal30      10                                intimal thickening +----------+--------+--------+--------+------------------+------------------+ ICA Prox  23      11      1-39%   smooth                               +----------+--------+--------+--------+------------------+------------------+ ICA Mid   39      20                                                   +----------+--------+--------+--------+------------------+------------------+ ICA Distal64      26                                                   +----------+--------+--------+--------+------------------+------------------+

## 2023-03-23 NOTE — TOC CM/SW Note (Signed)
Transition of Care [facility]) - Inpatient Brief Assessment   Patient Details  Name: Caroline Williams MRN: [id number] Date of Birth: 02-Jun-1961  Transition of Care [facility]) CM/SW Contact:    Baldemar Lenis, LCSW Phone Number: 03/23/2023, 10:13 AM   Clinical Narrative:    CSW noting recommendation for SNF placement, however patient not following commands at this time. Not ready for SNF workup currently. CSW to follow for workup when appropriate.    Transition of Care Asessment: Insurance and Status: Insurance coverage has been reviewed Patient has primary care physician: Yes Home environment has been reviewed: Home with significant other Prior level of function:: Independent Prior/Current Home Services: No current home services Social Determinants of Health Reivew: SDOH reviewed needs interventions Readmission risk has been reviewed: Yes Transition of care needs: transition of care needs identified, TOC will continue to follow

## 2023-03-23 NOTE — Assessment & Plan Note (Signed)
History of recent GI bleed and ischemic colitis. Hemoglobin currently stable at 10.2 -Monitor hemoglobin -Transfuse if below 7

## 2023-03-23 NOTE — Progress Notes (Signed)
Physical Therapy Treatment Patient Details Name: NACONA DEEL MRN: [id number] DOB: [birth year] Today's Date: 03/23/2023   History of Present Illness Pt is a 62 y/o female presenting to [facility] hospital on 03/21/2023 with aphasia. MRI demonstrates bilateral acute ischemic infarcts, suggestive of cardioembolic source. Pt was recently discharged 10/3 after management of ischemic colitis. PMH includes: L4-5 PLIF 7/31, anxiety, arthritis, bipolar 1, CAD, COPD, depression, DM, HTN, hernia repair, R TKR.    PT Comments  Pt is progressing towards goals. Currently pt is CGA for supine to sitting EOB, Mod A for bil LE due to pain for sitting to supine. Pt is Mod A for squat pivot from EOB <> BSC. Pt was able to scoot up EOB at SBA with good hip clearance demonstrating good bil LE/UE strength for functional mobility. Pt continues with expressive aphasia. Due to pt current functional status, home set up and available assistance at home recommending skilled physical therapy services < 3 hours/day on discharge from acute care hospital setting in order to decrease risk for falls, injury and re-hospitalization. Pt tolerated treatment session well but was limited by pain in the RLE.     If plan is discharge home, recommend the following: Two people to help with walking and/or transfers;Assistance with cooking/housework;Assist for transportation;Help with stairs or ramp for entrance;Supervision due to cognitive status     Equipment Recommendations  Hospital bed;Hoyer lift;Wheelchair (measurements PT)       Precautions / Restrictions Precautions Precautions: Fall Precaution Comments: Watch BP Restrictions Weight Bearing Restrictions: No     Mobility  Bed Mobility Overal bed mobility: Needs Assistance Bed Mobility: Supine to Sit, Sit to Supine     Supine to sit: Contact guard, HOB elevated Sit to supine: Mod assist   General bed mobility comments: CGA for trunk with HOB elevated for supine to sitting. MOd A  with bil LE for sitting to supine    Transfers Overall transfer level: Needs assistance Equipment used: None Transfers: Bed to chair/wheelchair/BSC       Squat pivot transfers: Mod assist     General transfer comment: Mod A for squat pivot transfer from EOB <> BSC. Pt maintained flexed trunk throughout movement. Pt scoot EOB 5x with good hip clearance utilizing bil LE at SBA.    Ambulation/Gait     General Gait Details: Unable to progress at this time due to pt reporting significant pain in the RLE with gestures/moaning and stating she wants to lay back down.    Modified Rankin (Stroke Patients Only) Modified Rankin (Stroke Patients Only) Pre-Morbid Rankin Score: Moderate disability Modified Rankin: Moderately severe disability     Balance Overall balance assessment: Needs assistance Sitting-balance support: Feet unsupported, Bilateral upper extremity supported Sitting balance-Leahy Scale: Fair     Standing balance support: Bilateral upper extremity supported Standing balance-Leahy Scale: Poor Standing balance comment: Reliant on external support for balance.       Cognition Arousal: Alert Behavior During Therapy: WFL for tasks assessed/performed Overall Cognitive Status: Within Functional Limits for tasks assessed     General Comments: pt with expressive aphasia at this time           General Comments General comments (skin integrity, edema, etc.): HR up to 125 bpm throughout session      Pertinent Vitals/Pain Pain Assessment Pain Assessment: Faces Faces Pain Scale: Hurts even more Pain Location: RLE Pain Descriptors / Indicators: Moaning, Grimacing, Guarding Pain Intervention(s): Monitored during session, Limited activity within patient's tolerance, RN gave pain meds during session  PT Goals (current goals can now be found in the care plan section) Acute Rehab PT Goals Patient Stated Goal: To improve mobility PT Goal Formulation: Patient unable  to participate in goal setting Time For Goal Achievement: 04/05/23 Potential to Achieve Goals: Fair Progress towards PT goals: Progressing toward goals    Frequency    Min 1X/week      PT Plan  Continue with current POC       AM-PAC PT "6 Clicks" Mobility   Outcome Measure  Help needed turning from your back to your side while in a flat bed without using bedrails?: A Little Help needed moving from lying on your back to sitting on the side of a flat bed without using bedrails?: A Little Help needed moving to and from a bed to a chair (including a wheelchair)?: A Lot Help needed standing up from a chair using your arms (e.g., wheelchair or bedside chair)?: Total Help needed to walk in hospital room?: Total Help needed climbing 3-5 steps with a railing? : Total 6 Click Score: 11    End of Session   Activity Tolerance: Patient limited by pain Patient left: in bed;with call bell/phone within reach;with nursing/sitter in room Nurse Communication: Mobility status PT Visit Diagnosis: Other abnormalities of gait and mobility (R26.89);Muscle weakness (generalized) (M62.81);Other symptoms and signs involving the nervous system (R29.898)     Time: 1610-9604 PT Time Calculation (min) (ACUTE ONLY): 26 min  Charges:    $Therapeutic Activity: 23-37 mins PT General Charges $$ ACUTE PT VISIT: 1 Visit                    Harrel Carina, DPT, CLT  Acute Rehabilitation Services Office: [phone] (Secure chat preferred)    Claudia Desanctis 03/23/2023, 2:53 PM

## 2023-03-23 NOTE — Progress Notes (Signed)
   03/23/23 1935  Assess: MEWS Score  Temp 98 F (36.7 C)  BP (!) 134/90  MAP (mmHg) 101  Pulse Rate (!) 115  Resp 17  SpO2 99 %  O2 Device Room Air  Assess: MEWS Score  MEWS Temp 0  MEWS Systolic 0  MEWS Pulse 2  MEWS RR 0  MEWS LOC 0  MEWS Score 2  MEWS Score Color Yellow  Assess: if the MEWS score is Yellow or Red  Were vital signs accurate and taken at a resting state? Yes  Does the patient meet 2 or more of the SIRS criteria? No  MEWS guidelines implemented  Yes, yellow  Treat  MEWS Interventions Considered administering scheduled or prn medications/treatments as ordered  Take Vital Signs  Increase Vital Sign Frequency  Yellow: Q2hr x1, continue Q4hrs until patient remains green for 12hrs  Escalate  MEWS: Escalate Yellow: Discuss with charge nurse and consider notifying provider and/or RRT  Notify: Charge Nurse/RN  Name of Charge Nurse/RN Notified Lars Masson, RN  Provider Notification  Provider Name/Title Dr Tereasa Coop  Date Provider Notified 03/23/23  Time Provider Notified 2001  Method of Notification Page  Notification Reason Other (Comment) (yellow MEWS)  Provider response No new orders (provider responded to page)  Date of Provider Response 03/23/23  Time of Provider Response 2003  Assess: SIRS CRITERIA  SIRS Temperature  0  SIRS Pulse 1  SIRS Respirations  0  SIRS WBC 0  SIRS Score Sum  1   Pt in no distress, will continue to monitor pt.

## 2023-03-23 NOTE — Progress Notes (Signed)
Pt unable to void, encouraged to urinate multiple times, but were unsuccessful. Bladder scan indicated 700 ml of urine in bladder. Howerter, MD notified. Order for In and Out catheter placed, procedure explained to pt/no evidance of learning. In and out catheterization completed a the bedside in room 3w14 for acute urinary retention by Christean Grief, RN assisted by Joice Lofts, NT. Peri care completed, sterile technique maintained throughout the procedure, and of clear amber urine collected.  No difficulties during insertion, pt tolerated well. Afterwards, pt resting comfortably in the bed.

## 2023-03-23 NOTE — Assessment & Plan Note (Signed)
Echocardiogram with EF of 40 to 45%, which is slightly below than her baseline of 50% Also concern of right ventricular strain with extensive PE and clot in transit. Not a candidate for aggressive diuresis although troponin and BNP were elevated due to acute right ventricular strain and massive PE. -Cardiology is on board

## 2023-03-24 ENCOUNTER — Other Ambulatory Visit (HOSPITAL_COMMUNITY): Payer: Self-pay

## 2023-03-24 DIAGNOSIS — I471 Supraventricular tachycardia, unspecified: Secondary | ICD-10-CM

## 2023-03-24 DIAGNOSIS — Z515 Encounter for palliative care: Secondary | ICD-10-CM

## 2023-03-24 DIAGNOSIS — I639 Cerebral infarction, unspecified: Secondary | ICD-10-CM | POA: Diagnosis not present

## 2023-03-24 DIAGNOSIS — I2609 Other pulmonary embolism with acute cor pulmonale: Secondary | ICD-10-CM | POA: Diagnosis not present

## 2023-03-24 DIAGNOSIS — I5189 Other ill-defined heart diseases: Secondary | ICD-10-CM

## 2023-03-24 DIAGNOSIS — I2489 Other forms of acute ischemic heart disease: Secondary | ICD-10-CM | POA: Diagnosis not present

## 2023-03-24 LAB — HEPARIN LEVEL (UNFRACTIONATED)
Heparin Unfractionated: 0.56 [IU]/mL (ref 0.30–0.70)
Heparin Unfractionated: 0.3 [IU]/mL (ref 0.30–0.70)

## 2023-03-24 LAB — CBC
HCT: 30.4 % — ABNORMAL LOW (ref 36.0–46.0)
Hemoglobin: 9.4 g/dL — ABNORMAL LOW (ref 12.0–15.0)
MCH: 26 pg (ref 26.0–34.0)
MCHC: 30.9 g/dL (ref 30.0–36.0)
MCV: 84 fL (ref 80.0–100.0)
Platelets: 296 10*3/uL (ref 150–400)
RBC: 3.62 MIL/uL — ABNORMAL LOW (ref 3.87–5.11)
RDW: 16.4 % — ABNORMAL HIGH (ref 11.5–15.5)
WBC: 7.3 10*3/uL (ref 4.0–10.5)
nRBC: 0 % (ref 0.0–0.2)

## 2023-03-24 LAB — GLUCOSE, CAPILLARY
Glucose-Capillary: 92 mg/dL (ref 70–99)
Glucose-Capillary: 103 mg/dL — ABNORMAL HIGH (ref 70–99)
Glucose-Capillary: 97 mg/dL (ref 70–99)
Glucose-Capillary: 144 mg/dL — ABNORMAL HIGH (ref 70–99)

## 2023-03-24 LAB — ANTITHROMBIN III: AntiThromb III Func: 103 % (ref 75–120)

## 2023-03-24 MED ORDER — METOPROLOL SUCCINATE ER 25 MG PO TB24
25.0000 mg | ORAL_TABLET | Freq: Every day | ORAL | Status: DC
  Administered 2023-03-24 – 2023-03-29 (×6): 25 mg via ORAL
  Filled 2023-03-24 (×6): qty 1

## 2023-03-24 MED ORDER — METOPROLOL TARTRATE 5 MG/5ML IV SOLN: 5.0000 mg | Freq: Once | INTRAVENOUS | Status: DC

## 2023-03-24 NOTE — Assessment & Plan Note (Signed)
Patient had 1 transient episode of SVT this morning, remained asymptomatic. Cardiology added metoprolol -Continue to monitor

## 2023-03-24 NOTE — Progress Notes (Signed)
PHARMACY - ANTICOAGULATION CONSULT NOTE  Pharmacy Consult for heparin infusion Indication:  coronary thrombus  + acute PE w/ RHS  Allergies  Allergen Reactions   Asa [Aspirin] Anaphylaxis, Shortness Of Breath and Other (See Comments)    Respiratory distress  Pt states she has had Toradol several times without any reactions   Bee Venom Anaphylaxis   Sulfa Antibiotics Anaphylaxis   Nsaids Other (See Comments)    History of gastric bypass   Other Other (See Comments)    No blood products.  Patient did  Request that only albumin or albumin-containing products may be administered   Repatha [Evolocumab] Other (See Comments)    Unknown reaction    Patient Measurements: Height: 5\' 5"  (165.1 cm) Weight: 97.5 kg (215 lb) (weight from 8/24) IBW/kg (Calculated) : 57 Heparin Dosing Weight: 79 kg  Vital Signs: Temp: 98.3 F (36.8 C) (10/10 0735) Temp Source: Oral (10/10 0735) BP: 149/108 (10/10 0735) Pulse Rate: 99 (10/10 0735)  Labs: Recent Labs    03/21/23 1746 03/21/23 1750 03/22/23 1211 03/22/23 1729 03/22/23 1817 03/23/23 0041 03/23/23 0517 03/23/23 0934 03/23/23 2118 03/24/23 0711  HGB 10.4* 11.9* 9.3*  --   --   --  10.2*  --   --  9.4*  HCT 33.9* 35.0* 30.1*  --   --   --  33.8*  --   --  30.4*  PLT 366  --  312  --   --   --  326  --   --  296  APTT 30  --   --   --   --   --   --   --   --   --   LABPROT 13.6  --   --   --   --   --   --   --   --   --   INR 1.0  --   --   --   --   --   --   --   --   --   HEPARINUNFRC  --   --   --   --   --    < >  --  0.20* 0.42 0.56  CREATININE 1.68* 1.60* 1.56*  --   --   --  1.53*  --   --   --   TROPONINIHS  --   --   --  1,100* 1,027*  --   --   --   --   --    < > = values in this interval not displayed.    Estimated Creatinine Clearance: 43.5 mL/min (A) (by C-G formula based on SCr of 1.53 mg/dL (H)).   Medical History: Past Medical History:  Diagnosis Date   Anxiety    Arthritis    Asthma    Bipolar 1  disorder (HCC)    Breast discharge    Breast lump    Breast pain    CAD (coronary artery disease) 12/03/2022    o Cath 2008: no CAD   o CCTA 10/05/19: CAC score 49, 88th percentile, nonobstructive CAD (LM 1-24 mid and dist)  o Myoview 12/31/20: EF 45, no ischemia or infarction, low risk    Chronic pain    on MS Contin and oxycodone   COPD (chronic obstructive pulmonary disease) (HCC)    Depression    Diabetes mellitus    diet and exercise controlled   Dysrhythmia    HR in 30s on metoprolol  Escherichia coli (E. coli) infection    Fever    GERD (gastroesophageal reflux disease)    History of chest pain    History of knee replacement, total    Hypertension    Hypothyroidism    N&V (nausea and vomiting)    Peripheral neuropathy    Right foot no sensation   Sciatica    Sleep apnea    does not use CPAP   Thyroid disease    Wears glasses     Assessment: 61YOF with a PMH of CAD and recent GI hemorrhage not on [facility] PTA who is admitted with left MCA stroke with concern for right hemisphere involvement and concern for central embolic stroke. Patient now found to have left atrial mass with PFO and mass extending into the right atrium. CT positive for acute PE with CT evidence of right heart strain (RV/LV Ratio = 1.54). Pharmacy consulted to dose heparin.  No bolus per MD and will have lower therapeutic goal due to CVA.   10/10 AM: Heparin level above goal at 0.56 with heparin running at 1,150 units/hour. Slight Hgb drift from yesterday (10.2>>9.3), but overall stable. No signs of bleeding or issues with heparin infusion noted. Heparin level was previously therapeutic at 1,000 units/hour, but was subtherapeutic on confirmatory check.   Goal of Therapy:  Heparin level 0.3-0.5 units/ml Monitor platelets by anticoagulation protocol: Yes   Plan:  Decrease heparin infusion to 1050 units/hr Check heparin level in 8 hours and daily while on heparin Continue to monitor H&H and  platelets  Thank you for allowing pharmacy to be a part of this patient's care.  Jani Gravel, PharmD Clinical Pharmacist  03/24/2023 8:04 AM

## 2023-03-24 NOTE — Plan of Care (Signed)
  Problem: Fluid Volume: Goal: Hemodynamic stability will improve Outcome: Progressing   Problem: Clinical Measurements: Goal: Diagnostic test results will improve Outcome: Progressing Goal: Signs and symptoms of infection will decrease Outcome: Progressing   Problem: Respiratory: Goal: Ability to maintain adequate ventilation will improve Outcome: Progressing   Problem: Education: Goal: Ability to verbalize activity precautions or restrictions will improve Outcome: Not Progressing Goal: Knowledge of the prescribed therapeutic regimen will improve Outcome: Not Progressing   Problem: Activity: Goal: Ability to avoid complications of mobility impairment will improve Outcome: Progressing Goal: Ability to tolerate increased activity will improve Outcome: Progressing Goal: Will remain free from falls Outcome: Progressing   Problem: Bowel/Gastric: Goal: Gastrointestinal status for postoperative course will improve Outcome: Progressing   Problem: Clinical Measurements: Goal: Ability to maintain clinical measurements within normal limits will improve Outcome: Progressing Goal: Postoperative complications will be avoided or minimized Outcome: Progressing Goal: Diagnostic test results will improve Outcome: Progressing   Problem: Pain Management: Goal: Pain level will decrease Outcome: Progressing   Problem: Skin Integrity: Goal: Will show signs of wound healing Outcome: Progressing   Problem: Health Behavior/Discharge Planning: Goal: Identification of resources available to assist in meeting health care needs will improve Outcome: Progressing   Problem: Education: Goal: Knowledge of disease or condition will improve Outcome: Progressing Goal: Knowledge of secondary prevention will improve (MUST DOCUMENT ALL) Outcome: Progressing Goal: Knowledge of patient specific risk factors will improve Loraine Leriche N/A or DELETE if not current risk factor) Outcome: Progressing    Problem: Ischemic Stroke/TIA Tissue Perfusion: Goal: Complications of ischemic stroke/TIA will be minimized Outcome: Progressing   Problem: Health Behavior/Discharge Planning: Goal: Ability to manage health-related needs will improve Outcome: Progressing Goal: Goals will be collaboratively established with patient/family Outcome: Progressing   Problem: Self-Care: Goal: Ability to participate in self-care as condition permits will improve Outcome: Not Progressing Goal: Verbalization of feelings and concerns over difficulty with self-care will improve Outcome: Not Progressing Goal: Ability to communicate needs accurately will improve Outcome: Not Progressing   Problem: Nutrition: Goal: Risk of aspiration will decrease Outcome: Progressing Goal: Dietary intake will improve Outcome: Progressing   Problem: Education: Goal: Knowledge of General Education information will improve Description: Including pain rating scale, medication(s)/side effects and non-pharmacologic comfort measures Outcome: Progressing   Problem: Clinical Measurements: Goal: Ability to maintain clinical measurements within normal limits will improve Outcome: Progressing Goal: Will remain free from infection Outcome: Progressing Goal: Diagnostic test results will improve Outcome: Progressing Goal: Respiratory complications will improve Outcome: Progressing Goal: Cardiovascular complication will be avoided Outcome: Progressing   Problem: Health Behavior/Discharge Planning: Goal: Ability to manage health-related needs will improve Outcome: Progressing

## 2023-03-24 NOTE — Assessment & Plan Note (Addendum)
CTA positive for bilateral submassive PE with right ventricular strain.  Echocardiogram with concern of clot in transit.  Lengthy interdisciplinary conversation between cardiology, PCCM, neurology and IR and unfortunately patient is not a candidate for any surgical intervention or thrombolytic therapy. Will be very high risk for progression.  Currently stable on room air Lower extremity venous Doppler positive for right lower extremity age-indeterminate significant DVT. -Switch heparin with Xarelto -Closely monitor

## 2023-03-24 NOTE — [place] FL2 (Signed)
[place] MEDICAID FL2 LEVEL OF CARE FORM     IDENTIFICATION  Patient Name: Caroline Williams Birthdate: 1960/10/12 Sex: female Admission Date (Current Location): 03/21/2023  [facility] and [place] Number:  Producer, television/film/video and Address:  The . [facility], [address], [place], [place] [address]      Provider Number: [id number]  Attending Physician Name and Address:  Arnetha Courser, MD  Relative Name and Phone Number:       Current Level of Care: Hospital Recommended Level of Care: Skilled Nursing Facility Prior Approval Number:    Date Approved/Denied:   PASRR Number: Manual Review  Discharge Plan: SNF    Current Diagnoses: Patient Active Problem List   Diagnosis Date Noted   Paroxysmal SVT (supraventricular tachycardia) (HCC) 03/24/2023   Pulmonary embolism with acute cor pulmonale (HCC) 03/23/2023   Acute clinical systolic heart failure (HCC) 03/23/2023   History of GI bleed 03/23/2023   GERD (gastroesophageal reflux disease) 03/22/2023   Psychiatric disorder 03/22/2023   Acute CVA (cerebrovascular accident) (HCC) 03/21/2023   Acute ischemic colitis (HCC) 02/05/2023   Acute GI bleeding 02/01/2023   UTI (urinary tract infection) 01/31/2023   CKD (chronic kidney disease) stage 3, GFR 30-59 ml/min (HCC) 01/31/2023   AKI (acute kidney injury) (HCC) 01/31/2023   Acute metabolic encephalopathy 01/31/2023   ABLA (acute blood loss anemia) 01/31/2023   Gastrointestinal hemorrhage with melena 01/31/2023   Hypokalemia 01/31/2023   Spondylolisthesis, lumbar region 01/12/2023   PVC's (premature ventricular contractions) 12/03/2022   CAD (coronary artery disease) 12/03/2022   Preoperative cardiovascular examination 12/02/2022   Bipolar 1 disorder (HCC)    Degeneration of lumbar intervertebral disc 08/07/2020   Essential (primary) hypertension 05/07/2020   Other chronic pain 02/14/2020   Elevated blood-pressure reading, without diagnosis of hypertension  02/13/2020   Hyperlipidemia 09/17/2019   COPD mixed type (HCC) 04/17/2019   Tobacco user 04/17/2019   Nocturnal hypoxemia 12/25/2018   Muscle weakness 12/05/2018   Change in bowel habits 03/14/2018   Exertional chest pain 01/26/2016   Hallucination, drug-induced (HCC) 03/12/2015   Hypothyroidism 03/12/2015   Chronic low back pain 01/29/2015   Lumbar spondylosis 01/29/2015   History of Roux-en-Y gastric bypass 2016 11/25/2014   Diabetes mellitus with neuropathy (HCC) 06/20/2013   Chronic pain of right knee 06/20/2013   Morbid obesity (HCC) 07/05/2012    Orientation RESPIRATION BLADDER Height & Weight     Self  Normal Continent Weight: 215 lb (97.5 kg) (weight from 8/24) Height:  5\' 5"  (165.1 cm)  BEHAVIORAL SYMPTOMS/MOOD NEUROLOGICAL BOWEL NUTRITION STATUS        Diet (Heart)  AMBULATORY STATUS COMMUNICATION OF NEEDS Skin   Extensive Assist Verbally Normal                       Personal Care Assistance Level of Assistance  Feeding, Dressing, Bathing Bathing Assistance: Maximum assistance Feeding assistance: Limited assistance Dressing Assistance: Maximum assistance     Functional Limitations Info  Sight Sight Info: Impaired        SPECIAL CARE FACTORS FREQUENCY  PT (By licensed PT), OT (By licensed OT)     PT Frequency: 5x/week OT Frequency: 5x/week            Contractures      Additional Factors Info  Code Status, Allergies Code Status Info: Full Allergies Info: Asa (Aspirin), Bee Venom, Sulfa Antibiotics, Nsaids, Other, Repatha (Evolocumab  Current Medications (03/24/2023):  This is the current hospital active medication list Current Facility-Administered Medications  Medication Dose Route Frequency Provider Last Rate Last Admin   acetaminophen (TYLENOL) tablet 650 mg  650 mg Oral Q6H PRN Zierle-Ghosh, Asia B, DO   650 mg at 03/23/23 1952   Or   acetaminophen (TYLENOL) suppository 650 mg  650 mg Rectal Q6H PRN Zierle-Ghosh, Asia B,  DO       albuterol (PROVENTIL) (2.5 MG/3ML) 0.083% nebulizer solution 3 mL  3 mL Inhalation Q6H PRN Zierle-Ghosh, Asia B, DO       ALPRAZolam (XANAX) tablet 1 mg  1 mg Oral TID PRN Zierle-Ghosh, Asia B, DO       brexpiprazole (REXULTI) tablet 2 mg  2 mg Oral Daily Zierle-Ghosh, Asia B, DO   2 mg at 03/24/23 1013   cilostazol (PLETAL) tablet 50 mg  50 mg Oral BID Pokhrel, Laxman, MD   50 mg at 03/24/23 1014   dapagliflozin propanediol (FARXIGA) tablet 5 mg  5 mg Oral Daily Zierle-Ghosh, Asia B, DO   5 mg at 03/24/23 1014   doxepin (SINEQUAN) capsule 75 mg  75 mg Oral QHS Zierle-Ghosh, Asia B, DO   75 mg at 03/23/23 2157   fesoterodine (TOVIAZ) tablet 4 mg  4 mg Oral Daily Zierle-Ghosh, Asia B, DO   4 mg at 03/24/23 1013   heparin ADULT infusion 100 units/mL (25000 units/266mL)  1,050 Units/hr Intravenous Continuous Paytes, Austin A, RPH 10.5 mL/hr at 03/24/23 1011 1,050 Units/hr at 03/24/23 1011   levothyroxine (SYNTHROID) tablet 150 mcg  150 mcg Oral Q0600 Zierle-Ghosh, Asia B, DO   150 mcg at 03/24/23 0653   metoprolol succinate (TOPROL-XL) 24 hr tablet 25 mg  25 mg Oral Daily O'Neal, Ronnald Ramp, MD   25 mg at 03/24/23 1215   morphine (MS CONTIN) 12 hr tablet 15 mg  15 mg Oral Q12H Zierle-Ghosh, Asia B, DO   15 mg at 03/24/23 1015   ondansetron (ZOFRAN) tablet 4 mg  4 mg Oral Q6H PRN Zierle-Ghosh, Asia B, DO       Or   ondansetron (ZOFRAN) injection 4 mg  4 mg Intravenous Q6H PRN Zierle-Ghosh, Asia B, DO       oxyCODONE (Oxy IR/ROXICODONE) immediate release tablet 5 mg  5 mg Oral Q4H PRN Zierle-Ghosh, Asia B, DO       pantoprazole (PROTONIX) EC tablet 40 mg  40 mg Oral Daily Zierle-Ghosh, Asia B, DO   40 mg at 03/24/23 1016   rOPINIRole (REQUIP) tablet 2 mg  2 mg Oral QHS Zierle-Ghosh, Asia B, DO   2 mg at 03/23/23 2157   rosuvastatin (CRESTOR) tablet 40 mg  40 mg Oral Daily Margaretmary Dys, MD   40 mg at 03/24/23 1014   sodium chloride flush (NS) 0.9 % injection 10 mL  10 mL  Intravenous Q12H Charlott Holler, MD   10 mL at 03/24/23 1016   sodium chloride flush (NS) 0.9 % injection 3 mL  3 mL Intravenous Once Zierle-Ghosh, Asia B, DO       umeclidinium-vilanterol (ANORO ELLIPTA) 62.5-25 MCG/ACT 1 puff  1 puff Inhalation Daily Zierle-Ghosh, Asia B, DO   1 puff at 03/24/23 0826   vortioxetine HBr (TRINTELLIX) tablet 20 mg  20 mg Oral Daily Zierle-Ghosh, Asia B, DO   20 mg at 03/24/23 1013     Discharge Medications: Please see discharge summary for a list of discharge medications.  Relevant Imaging Results:  Relevant Lab Results:   Additional Information SSN: [id number]  Jachai Okazaki Felipa Emory, Student-Social Work

## 2023-03-24 NOTE — Progress Notes (Signed)
in the right atrium, extending across the interatrial septum into the left atrium. Central pulmonary arteries dilated, RV/LV ratio 1.54. Good contrast opacification of the pulmonary arterial tree. Central, saddle and segmental pulmonary emboli bilaterally, new since previous. Left coronary calcifications. Adequate contrast opacification of the thoracic aorta with no evidence of dissection, aneurysm, or stenosis. There is classic 3-vessel brachiocephalic arch anatomy without proximal stenosis. Minimal scattered calcified plaque in the arch. Mediastinum/Nodes: No mediastinal hematoma, mass, or adenopathy. Lungs/Pleura: Small left pleural effusion, new since previous. No pneumothorax. Pulmonary emphysema. Dependent atelectasis in both lung bases left greater than right. Peripheral somewhat geographic ground-glass opacities in both apices, and in the anterior and lateral left upper lobe. Upper Abdomen: Post gastric bypass surgery. 2.4 cm low-attenuation lesion peripherally in hepatic segment 2, stable since previous consistent with benign cyst. No acute findings. Musculoskeletal: Vertebral endplate spurring at multiple levels in the mid and lower thoracic spine. Review of the MIP images confirms the above findings. IMPRESSION: 1. Positive for acute PE with CT evidence of right heart strain (RV/LV Ratio = 1.54) consistent  with at least submassive (intermediate risk) PE. The presence of right heart strain has been associated with an increased risk of morbidity and mortality. Please refer to the "Code PE Focused" order set in EPIC. 2. Probable clot in transit across the interatrial septum from right to left atrium, significant systemic embolic risk. Critical Value/emergent results were sent by secure chat at the time of interpretation on 03/22/2023 at 8:44 pm to provider Velna Ochs, who acknowledged these results. 3. Small left pleural effusion. 4. Peripheral somewhat geographic ground-glass opacities in both apices, and in the anterior and lateral left upper lobe. 5. Aortic Atherosclerosis (ICD10-I70.0) and Emphysema (ICD10-J43.9). Electronically Signed   By: Corlis Leak M.D.   On: 03/22/2023 20:45   VAS US CAROTID  Result Date: 03/22/2023 Carotid Arterial Duplex Study Patient Name:  Caroline Williams  Date of Exam:   03/22/2023 Medical Rec #: [id number]        Accession #:    [id number] Date of Birth: 1960/07/07        Patient Gender: F Patient Age:   62 years Exam Location:  [facility] Procedure:      VAS US CAROTID Referring Phys: Leticia Penna --------------------------------------------------------------------------------  Indications:       Carotid artery disease. Risk Factors:      Hypertension, Diabetes, coronary artery disease. Comparison Study:  No prior study Performing Technologist: Shona Simpson  Examination Guidelines: A complete evaluation includes B-mode imaging, spectral Doppler, color Doppler, and power Doppler as needed of all accessible portions of each vessel. Bilateral testing is considered an integral part of a complete examination. Limited examinations for reoccurring indications may be performed as noted.  Right Carotid Findings: +----------+--------+--------+--------+------------------+------------------+           PSV cm/sEDV cm/sStenosisPlaque DescriptionComments            +----------+--------+--------+--------+------------------+------------------+ CCA Prox  93      13                                                   +----------+--------+--------+--------+------------------+------------------+ CCA Distal30      10  PSV cm/sEDV cm/sDescribeArm Pressure (mmHG) +----------+--------+--------+--------+-------------------+ Subclavian132     0                                   +----------+--------+--------+--------+-------------------+ +---------+--------+--+--------+-+ VertebralPSV cm/s31EDV cm/s9 +---------+--------+--+--------+-+   Summary: Right Carotid: Velocities in the right ICA are consistent with a 1-39% stenosis. Left Carotid: Velocities in the left ICA are consistent with a 1-39% stenosis. Vertebrals:  Bilateral vertebral arteries demonstrate antegrade flow. Subclavians: Normal flow hemodynamics were seen in bilateral subclavian              arteries. *See table(s) above for measurements and observations.  Electronically signed by Lemar Livings MD on 03/22/2023 at 6:05:31 PM.    Final    ECHOCARDIOGRAM COMPLETE  Result Date: 03/22/2023    ECHOCARDIOGRAM REPORT   Patient Name:   Caroline Williams Date of Exam: 03/22/2023 Medical Rec #:  [id number]       Height:       65.0 in Accession #:    [id number]      Weight:       215.0 lb Date of Birth:  [birth year]       BSA:          2.040 m Patient Age:    62 years        BP:           142/88 mmHg Patient Gender: F               HR:           110 bpm. Exam Location:  Inpatient Procedure: 2D Echo, Cardiac Doppler, Color Doppler and Intracardiac            Opacification Agent Indications:     Stroke I63.9  History:        Patient has prior history of Echocardiogram examinations, most                 recent 12/28/2022. CAD, Stroke and COPD, Arrythmias:PVC,                 Signs/Symptoms:Chest Pain; Risk Factors:Hypertension, Diabetes,                 Dyslipidemia, Current Smoker and Sleep Apnea. CKD, stage 3.  Sonographer:    [name] RCS Referring Phys: [id number] ASIA B ZIERLE-GHOSH  Sonographer Comments: Image acquisition challenging due to uncooperative patient. IMPRESSIONS  1. There is a mobile clot/thrombus in transit sitting in the interatrial septum.  2. Left ventricular ejection fraction, by estimation, is 40 to 45%. The left ventricle has mildly decreased function. The left ventricle demonstrates global hypokinesis. Left ventricular diastolic parameters are indeterminate.  3. Right ventricular systolic function is normal. The right ventricular size is normal.  4. Thrombus noted in the left atrium.  5. The mitral valve is normal in structure. No evidence of mitral valve regurgitation. No evidence of mitral stenosis.  6. The aortic valve is normal in structure. Aortic valve regurgitation is not visualized. No aortic stenosis is present.  7. There is borderline dilatation of the ascending aorta, measuring 36 mm.  8. The inferior vena cava is normal in size with greater than 50% respiratory variability, suggesting right atrial pressure of 3 mmHg.  9. Thrombus seen in the right atrium. FINDINGS  Left Ventricle: Left ventricular ejection fraction, by estimation, is 40 to 45%. The left ventricle has mildly decreased function. The left  PSV cm/sEDV cm/sDescribeArm Pressure (mmHG) +----------+--------+--------+--------+-------------------+ Subclavian132     0                                   +----------+--------+--------+--------+-------------------+ +---------+--------+--+--------+-+ VertebralPSV cm/s31EDV cm/s9 +---------+--------+--+--------+-+   Summary: Right Carotid: Velocities in the right ICA are consistent with a 1-39% stenosis. Left Carotid: Velocities in the left ICA are consistent with a 1-39% stenosis. Vertebrals:  Bilateral vertebral arteries demonstrate antegrade flow. Subclavians: Normal flow hemodynamics were seen in bilateral subclavian              arteries. *See table(s) above for measurements and observations.  Electronically signed by Lemar Livings MD on 03/22/2023 at 6:05:31 PM.    Final    ECHOCARDIOGRAM COMPLETE  Result Date: 03/22/2023    ECHOCARDIOGRAM REPORT   Patient Name:   Caroline Williams Date of Exam: 03/22/2023 Medical Rec #:  [id number]       Height:       65.0 in Accession #:    [id number]      Weight:       215.0 lb Date of Birth:  [birth year]       BSA:          2.040 m Patient Age:    62 years        BP:           142/88 mmHg Patient Gender: F               HR:           110 bpm. Exam Location:  Inpatient Procedure: 2D Echo, Cardiac Doppler, Color Doppler and Intracardiac            Opacification Agent Indications:     Stroke I63.9  History:        Patient has prior history of Echocardiogram examinations, most                 recent 12/28/2022. CAD, Stroke and COPD, Arrythmias:PVC,                 Signs/Symptoms:Chest Pain; Risk Factors:Hypertension, Diabetes,                 Dyslipidemia, Current Smoker and Sleep Apnea. CKD, stage 3.  Sonographer:    [name] RCS Referring Phys: [id number] ASIA B ZIERLE-GHOSH  Sonographer Comments: Image acquisition challenging due to uncooperative patient. IMPRESSIONS  1. There is a mobile clot/thrombus in transit sitting in the interatrial septum.  2. Left ventricular ejection fraction, by estimation, is 40 to 45%. The left ventricle has mildly decreased function. The left ventricle demonstrates global hypokinesis. Left ventricular diastolic parameters are indeterminate.  3. Right ventricular systolic function is normal. The right ventricular size is normal.  4. Thrombus noted in the left atrium.  5. The mitral valve is normal in structure. No evidence of mitral valve regurgitation. No evidence of mitral stenosis.  6. The aortic valve is normal in structure. Aortic valve regurgitation is not visualized. No aortic stenosis is present.  7. There is borderline dilatation of the ascending aorta, measuring 36 mm.  8. The inferior vena cava is normal in size with greater than 50% respiratory variability, suggesting right atrial pressure of 3 mmHg.  9. Thrombus seen in the right atrium. FINDINGS  Left Ventricle: Left ventricular ejection fraction, by estimation, is 40 to 45%. The left ventricle has mildly decreased function. The left  in the right atrium, extending across the interatrial septum into the left atrium. Central pulmonary arteries dilated, RV/LV ratio 1.54. Good contrast opacification of the pulmonary arterial tree. Central, saddle and segmental pulmonary emboli bilaterally, new since previous. Left coronary calcifications. Adequate contrast opacification of the thoracic aorta with no evidence of dissection, aneurysm, or stenosis. There is classic 3-vessel brachiocephalic arch anatomy without proximal stenosis. Minimal scattered calcified plaque in the arch. Mediastinum/Nodes: No mediastinal hematoma, mass, or adenopathy. Lungs/Pleura: Small left pleural effusion, new since previous. No pneumothorax. Pulmonary emphysema. Dependent atelectasis in both lung bases left greater than right. Peripheral somewhat geographic ground-glass opacities in both apices, and in the anterior and lateral left upper lobe. Upper Abdomen: Post gastric bypass surgery. 2.4 cm low-attenuation lesion peripherally in hepatic segment 2, stable since previous consistent with benign cyst. No acute findings. Musculoskeletal: Vertebral endplate spurring at multiple levels in the mid and lower thoracic spine. Review of the MIP images confirms the above findings. IMPRESSION: 1. Positive for acute PE with CT evidence of right heart strain (RV/LV Ratio = 1.54) consistent  with at least submassive (intermediate risk) PE. The presence of right heart strain has been associated with an increased risk of morbidity and mortality. Please refer to the "Code PE Focused" order set in EPIC. 2. Probable clot in transit across the interatrial septum from right to left atrium, significant systemic embolic risk. Critical Value/emergent results were sent by secure chat at the time of interpretation on 03/22/2023 at 8:44 pm to provider Velna Ochs, who acknowledged these results. 3. Small left pleural effusion. 4. Peripheral somewhat geographic ground-glass opacities in both apices, and in the anterior and lateral left upper lobe. 5. Aortic Atherosclerosis (ICD10-I70.0) and Emphysema (ICD10-J43.9). Electronically Signed   By: Corlis Leak M.D.   On: 03/22/2023 20:45   VAS US CAROTID  Result Date: 03/22/2023 Carotid Arterial Duplex Study Patient Name:  Caroline Williams  Date of Exam:   03/22/2023 Medical Rec #: [id number]        Accession #:    [id number] Date of Birth: 1960/07/07        Patient Gender: F Patient Age:   62 years Exam Location:  [facility] Procedure:      VAS US CAROTID Referring Phys: Leticia Penna --------------------------------------------------------------------------------  Indications:       Carotid artery disease. Risk Factors:      Hypertension, Diabetes, coronary artery disease. Comparison Study:  No prior study Performing Technologist: Shona Simpson  Examination Guidelines: A complete evaluation includes B-mode imaging, spectral Doppler, color Doppler, and power Doppler as needed of all accessible portions of each vessel. Bilateral testing is considered an integral part of a complete examination. Limited examinations for reoccurring indications may be performed as noted.  Right Carotid Findings: +----------+--------+--------+--------+------------------+------------------+           PSV cm/sEDV cm/sStenosisPlaque DescriptionComments            +----------+--------+--------+--------+------------------+------------------+ CCA Prox  93      13                                                   +----------+--------+--------+--------+------------------+------------------+ CCA Distal30      10  in the right atrium, extending across the interatrial septum into the left atrium. Central pulmonary arteries dilated, RV/LV ratio 1.54. Good contrast opacification of the pulmonary arterial tree. Central, saddle and segmental pulmonary emboli bilaterally, new since previous. Left coronary calcifications. Adequate contrast opacification of the thoracic aorta with no evidence of dissection, aneurysm, or stenosis. There is classic 3-vessel brachiocephalic arch anatomy without proximal stenosis. Minimal scattered calcified plaque in the arch. Mediastinum/Nodes: No mediastinal hematoma, mass, or adenopathy. Lungs/Pleura: Small left pleural effusion, new since previous. No pneumothorax. Pulmonary emphysema. Dependent atelectasis in both lung bases left greater than right. Peripheral somewhat geographic ground-glass opacities in both apices, and in the anterior and lateral left upper lobe. Upper Abdomen: Post gastric bypass surgery. 2.4 cm low-attenuation lesion peripherally in hepatic segment 2, stable since previous consistent with benign cyst. No acute findings. Musculoskeletal: Vertebral endplate spurring at multiple levels in the mid and lower thoracic spine. Review of the MIP images confirms the above findings. IMPRESSION: 1. Positive for acute PE with CT evidence of right heart strain (RV/LV Ratio = 1.54) consistent  with at least submassive (intermediate risk) PE. The presence of right heart strain has been associated with an increased risk of morbidity and mortality. Please refer to the "Code PE Focused" order set in EPIC. 2. Probable clot in transit across the interatrial septum from right to left atrium, significant systemic embolic risk. Critical Value/emergent results were sent by secure chat at the time of interpretation on 03/22/2023 at 8:44 pm to provider Velna Ochs, who acknowledged these results. 3. Small left pleural effusion. 4. Peripheral somewhat geographic ground-glass opacities in both apices, and in the anterior and lateral left upper lobe. 5. Aortic Atherosclerosis (ICD10-I70.0) and Emphysema (ICD10-J43.9). Electronically Signed   By: Corlis Leak M.D.   On: 03/22/2023 20:45   VAS US CAROTID  Result Date: 03/22/2023 Carotid Arterial Duplex Study Patient Name:  Caroline Williams  Date of Exam:   03/22/2023 Medical Rec #: [id number]        Accession #:    [id number] Date of Birth: 1960/07/07        Patient Gender: F Patient Age:   62 years Exam Location:  [facility] Procedure:      VAS US CAROTID Referring Phys: Leticia Penna --------------------------------------------------------------------------------  Indications:       Carotid artery disease. Risk Factors:      Hypertension, Diabetes, coronary artery disease. Comparison Study:  No prior study Performing Technologist: Shona Simpson  Examination Guidelines: A complete evaluation includes B-mode imaging, spectral Doppler, color Doppler, and power Doppler as needed of all accessible portions of each vessel. Bilateral testing is considered an integral part of a complete examination. Limited examinations for reoccurring indications may be performed as noted.  Right Carotid Findings: +----------+--------+--------+--------+------------------+------------------+           PSV cm/sEDV cm/sStenosisPlaque DescriptionComments            +----------+--------+--------+--------+------------------+------------------+ CCA Prox  93      13                                                   +----------+--------+--------+--------+------------------+------------------+ CCA Distal30      10  PSV cm/sEDV cm/sDescribeArm Pressure (mmHG) +----------+--------+--------+--------+-------------------+ Subclavian132     0                                   +----------+--------+--------+--------+-------------------+ +---------+--------+--+--------+-+ VertebralPSV cm/s31EDV cm/s9 +---------+--------+--+--------+-+   Summary: Right Carotid: Velocities in the right ICA are consistent with a 1-39% stenosis. Left Carotid: Velocities in the left ICA are consistent with a 1-39% stenosis. Vertebrals:  Bilateral vertebral arteries demonstrate antegrade flow. Subclavians: Normal flow hemodynamics were seen in bilateral subclavian              arteries. *See table(s) above for measurements and observations.  Electronically signed by Lemar Livings MD on 03/22/2023 at 6:05:31 PM.    Final    ECHOCARDIOGRAM COMPLETE  Result Date: 03/22/2023    ECHOCARDIOGRAM REPORT   Patient Name:   Caroline Williams Date of Exam: 03/22/2023 Medical Rec #:  [id number]       Height:       65.0 in Accession #:    [id number]      Weight:       215.0 lb Date of Birth:  [birth year]       BSA:          2.040 m Patient Age:    62 years        BP:           142/88 mmHg Patient Gender: F               HR:           110 bpm. Exam Location:  Inpatient Procedure: 2D Echo, Cardiac Doppler, Color Doppler and Intracardiac            Opacification Agent Indications:     Stroke I63.9  History:        Patient has prior history of Echocardiogram examinations, most                 recent 12/28/2022. CAD, Stroke and COPD, Arrythmias:PVC,                 Signs/Symptoms:Chest Pain; Risk Factors:Hypertension, Diabetes,                 Dyslipidemia, Current Smoker and Sleep Apnea. CKD, stage 3.  Sonographer:    [name] RCS Referring Phys: [id number] ASIA B ZIERLE-GHOSH  Sonographer Comments: Image acquisition challenging due to uncooperative patient. IMPRESSIONS  1. There is a mobile clot/thrombus in transit sitting in the interatrial septum.  2. Left ventricular ejection fraction, by estimation, is 40 to 45%. The left ventricle has mildly decreased function. The left ventricle demonstrates global hypokinesis. Left ventricular diastolic parameters are indeterminate.  3. Right ventricular systolic function is normal. The right ventricular size is normal.  4. Thrombus noted in the left atrium.  5. The mitral valve is normal in structure. No evidence of mitral valve regurgitation. No evidence of mitral stenosis.  6. The aortic valve is normal in structure. Aortic valve regurgitation is not visualized. No aortic stenosis is present.  7. There is borderline dilatation of the ascending aorta, measuring 36 mm.  8. The inferior vena cava is normal in size with greater than 50% respiratory variability, suggesting right atrial pressure of 3 mmHg.  9. Thrombus seen in the right atrium. FINDINGS  Left Ventricle: Left ventricular ejection fraction, by estimation, is 40 to 45%. The left ventricle has mildly decreased function. The left  Atraumatic, normal size  Eyes: PEERLA, EOMI  Neck: Supple, no JVD Endocrine: No thryomegaly Cardiac: Normal S1, S2; RRR; no murmurs, rubs, or gallops Lungs: Clear to auscultation bilaterally, no wheezing, rhonchi or rales  Abd: Soft, nontender, no hepatomegaly  Ext: No edema, pulses 2+ Musculoskeletal: No deformities, BUE and BLE strength normal and equal Skin: Warm and dry, no rashes   Neuro: Alert and oriented to person, place, time, and situation, CNII-XII grossly intact, no focal deficits  Psych: Normal mood and affect   Labs  High Sensitivity Troponin:   Recent Labs  Lab 03/22/23 1729 03/22/23 1817  TROPONINIHS 1,100* 1,027*     Cardiac EnzymesNo results for input(s): "TROPONINI" in the last 168 hours. No results for input(s): "TROPIPOC" in the last 168 hours.  Chemistry Recent Labs  Lab 03/21/23 1746 03/21/23 1750 03/22/23 1211 03/23/23 0517  NA 138 140 139 139  K 4.1 4.1 3.0* 4.2  CL 104 107 106 106  CO2 21*  --  22 17*  GLUCOSE 112* 112* 117* 84  BUN 19 20 17 14   CREATININE 1.68* 1.60* 1.56* 1.53*  CALCIUM 9.1  --  8.3* 8.6*  PROT 7.5  --  6.6  --   ALBUMIN 3.1*  --   2.7*  --   AST 25  --  23  --   ALT 14  --  10  --   ALKPHOS 153*  --  137*  --   BILITOT 1.1  --  0.8  --   GFRNONAA 34*  --  37* 38*  ANIONGAP 13  --  11 16*    Hematology Recent Labs  Lab 03/22/23 1211 03/23/23 0517 03/24/23 0711  WBC 9.3 10.3 7.3  RBC 3.53* 3.91 3.62*  HGB 9.3* 10.2* 9.4*  HCT 30.1* 33.8* 30.4*  MCV 85.3 86.4 84.0  MCH 26.3 26.1 26.0  MCHC 30.9 30.2 30.9  RDW 16.6* 16.9* 16.4*  PLT 312 326 296   BNP Recent Labs  Lab 03/22/23 1729  BNP 1,241.6*    DDimer No results for input(s): "DDIMER" in the last 168 hours.   Radiology  VAS [place] LOWER EXTREMITY VENOUS (DVT)  Result Date: 03/23/2023  Lower Venous DVT Study Patient Name:  Caroline Williams  Date of Exam:   03/23/2023 Medical Rec #: [id number]        Accession #:    [id number] Date of Birth: 1960/10/04        Patient Gender: F Patient Age:   60 years Exam Location:  [facility] Procedure:      VAS [place] LOWER EXTREMITY VENOUS (DVT) Referring Phys: Gerri Spore O'NEAL --------------------------------------------------------------------------------  Indications: Pulmonary embolism.  Risk Factors: None identified. Limitations: Poor ultrasound/tissue interface. Comparison Study: No prior study Performing Technologist: Shona Simpson  Examination Guidelines: A complete evaluation includes B-mode imaging, spectral Doppler, color Doppler, and power Doppler as needed of all accessible portions of each vessel. Bilateral testing is considered an integral part of a complete examination. Limited examinations for reoccurring indications may be performed as noted. The reflux portion of the exam is performed with the patient in reverse Trendelenburg.  +---------+---------------+---------+-----------+----------+-----------------+ RIGHT    CompressibilityPhasicitySpontaneityPropertiesThrombus Aging    +---------+---------------+---------+-----------+----------+-----------------+ CFV      Full           Yes      Yes                                     +---------+---------------+---------+-----------+----------+-----------------+  PSV cm/sEDV cm/sDescribeArm Pressure (mmHG) +----------+--------+--------+--------+-------------------+ Subclavian132     0                                   +----------+--------+--------+--------+-------------------+ +---------+--------+--+--------+-+ VertebralPSV cm/s31EDV cm/s9 +---------+--------+--+--------+-+   Summary: Right Carotid: Velocities in the right ICA are consistent with a 1-39% stenosis. Left Carotid: Velocities in the left ICA are consistent with a 1-39% stenosis. Vertebrals:  Bilateral vertebral arteries demonstrate antegrade flow. Subclavians: Normal flow hemodynamics were seen in bilateral subclavian              arteries. *See table(s) above for measurements and observations.  Electronically signed by Lemar Livings MD on 03/22/2023 at 6:05:31 PM.    Final    ECHOCARDIOGRAM COMPLETE  Result Date: 03/22/2023    ECHOCARDIOGRAM REPORT   Patient Name:   Caroline Williams Date of Exam: 03/22/2023 Medical Rec #:  [id number]       Height:       65.0 in Accession #:    [id number]      Weight:       215.0 lb Date of Birth:  [birth year]       BSA:          2.040 m Patient Age:    62 years        BP:           142/88 mmHg Patient Gender: F               HR:           110 bpm. Exam Location:  Inpatient Procedure: 2D Echo, Cardiac Doppler, Color Doppler and Intracardiac            Opacification Agent Indications:     Stroke I63.9  History:        Patient has prior history of Echocardiogram examinations, most                 recent 12/28/2022. CAD, Stroke and COPD, Arrythmias:PVC,                 Signs/Symptoms:Chest Pain; Risk Factors:Hypertension, Diabetes,                 Dyslipidemia, Current Smoker and Sleep Apnea. CKD, stage 3.  Sonographer:    [name] RCS Referring Phys: [id number] ASIA B ZIERLE-GHOSH  Sonographer Comments: Image acquisition challenging due to uncooperative patient. IMPRESSIONS  1. There is a mobile clot/thrombus in transit sitting in the interatrial septum.  2. Left ventricular ejection fraction, by estimation, is 40 to 45%. The left ventricle has mildly decreased function. The left ventricle demonstrates global hypokinesis. Left ventricular diastolic parameters are indeterminate.  3. Right ventricular systolic function is normal. The right ventricular size is normal.  4. Thrombus noted in the left atrium.  5. The mitral valve is normal in structure. No evidence of mitral valve regurgitation. No evidence of mitral stenosis.  6. The aortic valve is normal in structure. Aortic valve regurgitation is not visualized. No aortic stenosis is present.  7. There is borderline dilatation of the ascending aorta, measuring 36 mm.  8. The inferior vena cava is normal in size with greater than 50% respiratory variability, suggesting right atrial pressure of 3 mmHg.  9. Thrombus seen in the right atrium. FINDINGS  Left Ventricle: Left ventricular ejection fraction, by estimation, is 40 to 45%. The left ventricle has mildly decreased function. The left  PSV cm/sEDV cm/sDescribeArm Pressure (mmHG) +----------+--------+--------+--------+-------------------+ Subclavian132     0                                   +----------+--------+--------+--------+-------------------+ +---------+--------+--+--------+-+ VertebralPSV cm/s31EDV cm/s9 +---------+--------+--+--------+-+   Summary: Right Carotid: Velocities in the right ICA are consistent with a 1-39% stenosis. Left Carotid: Velocities in the left ICA are consistent with a 1-39% stenosis. Vertebrals:  Bilateral vertebral arteries demonstrate antegrade flow. Subclavians: Normal flow hemodynamics were seen in bilateral subclavian              arteries. *See table(s) above for measurements and observations.  Electronically signed by Lemar Livings MD on 03/22/2023 at 6:05:31 PM.    Final    ECHOCARDIOGRAM COMPLETE  Result Date: 03/22/2023    ECHOCARDIOGRAM REPORT   Patient Name:   Caroline Williams Date of Exam: 03/22/2023 Medical Rec #:  [id number]       Height:       65.0 in Accession #:    [id number]      Weight:       215.0 lb Date of Birth:  [birth year]       BSA:          2.040 m Patient Age:    62 years        BP:           142/88 mmHg Patient Gender: F               HR:           110 bpm. Exam Location:  Inpatient Procedure: 2D Echo, Cardiac Doppler, Color Doppler and Intracardiac            Opacification Agent Indications:     Stroke I63.9  History:        Patient has prior history of Echocardiogram examinations, most                 recent 12/28/2022. CAD, Stroke and COPD, Arrythmias:PVC,                 Signs/Symptoms:Chest Pain; Risk Factors:Hypertension, Diabetes,                 Dyslipidemia, Current Smoker and Sleep Apnea. CKD, stage 3.  Sonographer:    [name] RCS Referring Phys: [id number] ASIA B ZIERLE-GHOSH  Sonographer Comments: Image acquisition challenging due to uncooperative patient. IMPRESSIONS  1. There is a mobile clot/thrombus in transit sitting in the interatrial septum.  2. Left ventricular ejection fraction, by estimation, is 40 to 45%. The left ventricle has mildly decreased function. The left ventricle demonstrates global hypokinesis. Left ventricular diastolic parameters are indeterminate.  3. Right ventricular systolic function is normal. The right ventricular size is normal.  4. Thrombus noted in the left atrium.  5. The mitral valve is normal in structure. No evidence of mitral valve regurgitation. No evidence of mitral stenosis.  6. The aortic valve is normal in structure. Aortic valve regurgitation is not visualized. No aortic stenosis is present.  7. There is borderline dilatation of the ascending aorta, measuring 36 mm.  8. The inferior vena cava is normal in size with greater than 50% respiratory variability, suggesting right atrial pressure of 3 mmHg.  9. Thrombus seen in the right atrium. FINDINGS  Left Ventricle: Left ventricular ejection fraction, by estimation, is 40 to 45%. The left ventricle has mildly decreased function. The left

## 2023-03-24 NOTE — Progress Notes (Addendum)
STROKE TEAM PROGRESS NOTE   BRIEF HPI [name] is a 63 y.o. female with history of  anxiety, bipolar 1 disorder, coronary artery disease, COPD, depression, diabetes mellitus type 2, history of Roux-en-Y, GERD, hypothyroidism, hypertension, neuropathy presenting with a 2 day history of slurred, dysarthric speech, cognitive difficulties, diminished sensation, and motor difficulties. The patient is accompanied by her boyfriend who provided most of the history. The patient was last normal around 7 or 730 pm on 10/6, and when he saw her at 10 pm, her speech was off. She continued to speak gibberish and have motor difficulties (dragging her right leg). She walks with a cane at baseline, but has been unable to manage that. The pt's boyfriend attributes this to her previous hospitalization including the transfusions that he believes may have contributed to her stroke.  She came to the ED on 10/7. She got a CT head and two MRIs; the MRI head showed acute bilateral ischemic infarcts. This suggested a cardioembolic source. No TNK or Thrombectomy indicated as she is outside the window for both.    SIGNIFICANT HOSPITAL EVENTS 10/7 - Admitted to ED, MRIs conducted 10/8 - Echo showed large intraatrial thrombus, PE, HFrEF 40-45%. CT PE showed acute PE + right heart strain.   INTERIM HISTORY/SUBJECTIVE Met patient bedside with attending physician Dr Viviann Spare. Pt voiced that she was doing well, but was demonstrating word-finding difficulties.  OBJECTIVE  CBC    Component Value Date/Time   WBC 7.3 03/24/2023 0711   RBC 3.62 (L) 03/24/2023 0711   HGB 9.4 (L) 03/24/2023 0711   HGB 13.1 05/21/2021 0846   HCT 30.4 (L) 03/24/2023 0711   HCT 40.9 05/21/2021 0846   PLT 296 03/24/2023 0711   PLT 503 (H) 05/21/2021 0846   MCV 84.0 03/24/2023 0711   MCV 83 05/21/2021 0846   MCH 26.0 03/24/2023 0711   MCHC 30.9 03/24/2023 0711   RDW 16.4 (H) 03/24/2023 0711   RDW 13.3 05/21/2021 0846   LYMPHSABS 1.0  03/22/2023 1211   LYMPHSABS 1.2 05/21/2021 0846   MONOABS 0.5 03/22/2023 1211   EOSABS 0.0 03/22/2023 1211   EOSABS 0.1 05/21/2021 0846   BASOSABS 0.1 03/22/2023 1211   BASOSABS 0.0 05/21/2021 0846    BMET    Component Value Date/Time   NA 139 03/23/2023 0517   NA 138 10/07/2022 1443   K 4.2 03/23/2023 0517   CL 106 03/23/2023 0517   CO2 17 (L) 03/23/2023 0517   GLUCOSE 84 03/23/2023 0517   BUN 14 03/23/2023 0517   BUN 19 10/07/2022 1443   CREATININE 1.53 (H) 03/23/2023 0517   CALCIUM 8.6 (L) 03/23/2023 0517   EGFR 43 (L) 10/07/2022 1443   GFRNONAA 38 (L) 03/23/2023 0517    IMAGING past 24 hours VAS [place] LOWER EXTREMITY VENOUS (DVT)  Result Date: 03/23/2023  Lower Venous DVT Study Patient Name:  DOMINI DHILLON  Date of Exam:   03/23/2023 Medical Rec #: [id number]        Accession #:    [id number] Date of Birth: 1961-04-17        Patient Gender: F Patient Age:   9 years Exam Location:  [facility] Procedure:      VAS [place] LOWER EXTREMITY VENOUS (DVT) Referring Phys: Gerri Spore O'NEAL --------------------------------------------------------------------------------  Indications: Pulmonary embolism.  Risk Factors: None identified. Limitations: Poor ultrasound/tissue interface. Comparison Study: No prior study Performing Technologist: Shona Simpson  Examination Guidelines: A complete evaluation includes B-mode imaging, spectral Doppler, color Doppler,  and power Doppler as needed of all accessible portions of each vessel. Bilateral testing is considered an integral part of a complete examination. Limited examinations for reoccurring indications may be performed as noted. The reflux portion of the exam is performed with the patient in reverse Trendelenburg.  +---------+---------------+---------+-----------+----------+-----------------+ RIGHT    CompressibilityPhasicitySpontaneityPropertiesThrombus Aging    +---------+---------------+---------+-----------+----------+-----------------+ CFV       Full           Yes      Yes                                    +---------+---------------+---------+-----------+----------+-----------------+ SFJ      Full                                                           +---------+---------------+---------+-----------+----------+-----------------+ FV Prox  Partial                 Yes                  Age Indeterminate +---------+---------------+---------+-----------+----------+-----------------+ FV Mid   Partial                 Yes                  Age Indeterminate +---------+---------------+---------+-----------+----------+-----------------+ FV DistalPartial                 Yes                  Age Indeterminate +---------+---------------+---------+-----------+----------+-----------------+ PFV      Full                                                           +---------+---------------+---------+-----------+----------+-----------------+ POP      Full           Yes      Yes                                    +---------+---------------+---------+-----------+----------+-----------------+ PTV      None                    Yes                  Age Indeterminate +---------+---------------+---------+-----------+----------+-----------------+ PERO     None                    Yes                  Chronic           +---------+---------------+---------+-----------+----------+-----------------+   +---------+---------------+---------+-----------+----------+--------------+ LEFT     CompressibilityPhasicitySpontaneityPropertiesThrombus Aging +---------+---------------+---------+-----------+----------+--------------+ CFV      Full           Yes      Yes                                 +---------+---------------+---------+-----------+----------+--------------+  SFJ      Full                                                         +---------+---------------+---------+-----------+----------+--------------+ FV Prox  Full                                                        +---------+---------------+---------+-----------+----------+--------------+ FV Mid   Full                                                        +---------+---------------+---------+-----------+----------+--------------+ FV DistalFull                                                        +---------+---------------+---------+-----------+----------+--------------+ PFV      Full                                                        +---------+---------------+---------+-----------+----------+--------------+ POP      Full           Yes      Yes                                 +---------+---------------+---------+-----------+----------+--------------+ PTV      Full                                                        +---------+---------------+---------+-----------+----------+--------------+ PERO     Full                                                        +---------+---------------+---------+-----------+----------+--------------+ Superficial femoral vein seen with intermittent partial thrombi of indeterminate age. PTVs and PeroVs each have 1/2 veins obstructively thrombosed for the full length of the calf.    Summary: RIGHT: - Findings consistent with age indeterminate deep vein thrombosis involving the right femoral vein, right posterior tibial veins, and right peroneal veins.  - No cystic structure found in the popliteal fossa.  LEFT: - There is no evidence of deep vein thrombosis in the lower extremity.  - No cystic structure found in the popliteal fossa.  *See table(s) above for measurements and observations. Electronically signed by Coral Else MD on 03/23/2023 at 9:05:25 PM.    Final  Vitals:   03/24/23 0227 03/24/23 0322 03/24/23 0735 03/24/23 0827  BP: (!) 128/100 (!) 133/95 (!) 149/108   Pulse: 97 98 99  (!) 103  Resp: 16 16 17 18   Temp: 98 F (36.7 C) 98.4 F (36.9 C) 98.3 F (36.8 C)   TempSrc: Oral Oral Oral   SpO2: 98% 100%  98%  Weight:      Height:         PHYSICAL EXAM General:  Alert, obese African-American woman, pt is less anxious today than yesterday.  Psych:  Mood and affect appropriate for situation CV: Regular rate and rhythm on monitor. Pt hypertensive. Respiratory:  Pt reporting shortness of breath, but is satting well on room air. No acute distress. GI: Abdomen soft and nontender   NEURO:  Mental Status: Pt is unable to state her name, day, month, or year. Speech/Language: Aphasic, worse since yesterday. Naming, repetition, fluency, all impaired. Comprehension appears similar. Word salad has re-appeared.  Cranial Nerves:  II: PERRL. Visual fields full.  III, IV, VI: EOMI. Horizontal nystagmus bilaterally.  V: Sensation is intact to light touch and symmetrical to face.  VII: Face is symmetrical resting and smiling (no droop) VIII: hearing intact to voice. Equal bilaterally to rubbed fingers IX, X: Palate elevates symmetrically. Phonation is normal.  WG:NFAOZHYQ shrug 5/5. XII: tongue is midline without fasciculations. Motor: Left UE sensation intact, 5/5 strength. RUE sensation normal, strength 5/5. LLE sensation normal 4/5 strength, RLE sensation still impaired, but patient is able to move RLE voluntarily. Strength still 2+/5 (same on 10/10) Tone: is normal and bulk is normal Sensation- Sensation static relative to yesterday, remains weaker on Right.  Coordination: Pt unable to concentrate long enough to complete this. Appears grossly intact. Gait- Deferred  ASSESSMENT/PLAN Acute Ischemic Infarct:  bilateral L MCA distribution infarcts (frontal, parietal, temporal) R frontal lobe, chronic right frontal infarcts. Multiple Pulmonary Emboli Etiology:  Likely cardio embolic given the bilateral acute infarcts CT PE study 03/22/2023 IMPRESSION: 1. Positive for  acute PE with CT evidence of right heart strain (RV/LV Ratio = 1.54) consistent with at least submassive (intermediate risk) PE. The presence of right heart strain has been associated with an increased risk of morbidity and mortality. 2. Probable clot in transit across the interatrial septum from right to left atrium, significant systemic embolic risk. 3. Small left pleural effusion. 4. Peripheral somewhat geographic ground-glass opacities in both apices, and in the anterior and lateral left upper lobe. 5. Aortic Atherosclerosis (ICD10-I70.0) and Emphysema (ICD10-J43.9). TTE 03/22/2023 1. There is a mobile clot/thrombus in transit sitting in the interatrial septum.    2. Left ventricular ejection fraction, by estimation, is 40 to 45%. The left ventricle has mildly decreased function. The left ventricle demonstrates global hypokinesis. Left ventricular diastolic parameters are indeterminate.    3. Right ventricular systolic function is normal. The right ventricular size is normal.    4. Thrombus noted in the left atrium.    5. The mitral valve is normal in structure. No evidence of mitral valve regurgitation. No evidence of mitral stenosis.    6. The aortic valve is normal in structure. Aortic valve regurgitation is not visualized. No aortic stenosis is present.    7. There is borderline dilatation of the ascending aorta, measuring 36 mm.    8. The inferior vena cava is normal in size with greater than 50% respiratory variability, suggesting right atrial pressure of 3 mmHg.    9. Thrombus seen in the right atrium.  CT  head 10/7 1715 IMPRESSION: 1. Generalized cerebral atrophy with chronic white matter small vessel ischemic changes. 2. Chronic right frontal lobe and left posterior parietal infarcts. 3. No acute intracranial abnormality. MRI HEAD 10/7 2153 IMPRESSION:  1. Patchy acute left MCA distribution infarct involving the left frontal, parietal, and temporal lobes. No associated hemorrhage or  significant mass effect.  2. Additional small volume acute ischemic nonhemorrhagic infarcts involving the contralateral right frontal centrum semi ovale and left greater than right cerebellum. Given the various vascular distributions involved, a central thromboembolic etiology is suspected.  3. Chronic right frontal infarct.  MRA 10/7 2221 HEAD IMPRESSION: 1. Motion degraded exam. 2. Grossly negative intracranial MRA for large vessel occlusion. No visible hemodynamically significant or correctable stenosis.  LDL 139 HgbA1c 5.3 VTE prophylaxis - IV heparin 1000 units/hr, continuous  No antithrombotic prior to admission, now on clopidogrel 75 mg daily and cilostazol 50 mg daily  for 3 weeks and then plavix alone. Therapy recommendations:  Pending Disposition:  Admit to medical floor Based on her slightly worsening neuro exam, would order more imaging if she were healthier or having more focal symptoms. However, she is already on therapeutic heparin dosing and not a candidate for more aggressive interventions if we were to discover more ischemic clots. If she worsens, could re-order CT w/o contrast to look for bleed.  Hypertension Home meds:  imdur 30 mg Q 24, olmesartan-amlodipine-hydrochlorothiazide 40-5-25 mg daily, metoprolol 25 Q24, spironolactone 25 mg daily Stable Blood Pressure Goal: SBP 120-160 for first 24 hours then less than 180   Hyperlipidemia Home meds:  none,  LDL 139, goal < 70 Add rosuvastatin 40 mg daily Continue statin at discharge  Diabetes type II Controlled Home meds:  farxiga,  HgbA1c 5.3, goal < 7.0 CBGs SSI  Tobacco Abuse Patient does not smoke. Former, 11 pack years.  Substance Abuse Patient denies  Dysphagia Patient has post-stroke dysphagia, SLP consulted    Diet   Diet Heart Room service appropriate? Yes; Fluid consistency: Thin   Other Stroke Risk Factors Obesity, Body mass index is 35.78 kg/m., BMI >/= 30 associated with increased stroke risk,  recommend weight loss, diet and exercise as appropriate  Family hx stroke (father) Coronary artery disease  Other Active Problems Managed per primary  Neurology feels it is appropriate for [place] to sign off at this time, she is likely to continue to make progress with rehabilitation, but we can be re-consulted for acute mental status changes in the future.  Hospital day # 2  ATTENDING ATTESTATION:  62 year old with history of multiple risk factors for stroke and bipolar scattered left MCA ischemic and right frontal infarct with cardiac thrombus in left atrium and PE.  On heparin ggt. Complicated by h/o GI bleed/ischemic colitis.  On pletal and plavix. Not an candidate for embolectomy.   Agree with cardiology and transition to DOAC and Pletal.  DC Plavix.  There are no further interventional procedures from neurology standpoint.  Follow-up in stroke clinic in 8 weeks for [facility] with Dr. Pearlean Brownie  At this point neurology will sign off please call with questions.   Dr. Viviann Spare evaluated pt independently, reviewed imaging, chart, labs. Discussed and formulated plan with the Resident/APP. Changes were made to the note where appropriate. Please see APP/resident note above for details.   Total 25 minutes spent on counseling patient and coordinating care, writing notes and reviewing chart.  Jami Bogdanski,MD   To contact Stroke Continuity provider, please refer to [email or web address]. After hours, contact General Neurology

## 2023-03-24 NOTE — Progress Notes (Signed)
Speech Language Pathology Treatment: Cognitive-Linquistic  Patient Details Name: Caroline Williams MRN: [id number] DOB: 12-Oct-1960 Today's Date: 03/24/2023 Time: 9629-5284 SLP Time Calculation (min) (ACUTE ONLY): 25 min  Assessment / Plan / Recommendation Clinical Impression  Patient seen by SLP for skilled intervention focused on cognitive-linguistic function. Patient was asleep when SLP entered the room but awake to sound of voice. She was initially lethargic, but eventually remained awake and alert. Patient continues to demonstrate moderate-severe deficits in expression and reception, but is making functional progress towards goals. Verbal preservation and neologisms continue to largely characterize her speech. Receptive skills continue to be an area of strength, as the patient accurately matched tactile objects to pictures representations when given 2 and 4 choices. Confrontation naming was most successful with multi-modal representation and least successful with pictures alone. When given 10+ options, accuracy decreased greatly regardless of cues. Successful cueing strategies included visual and verbal. Patient correctly identified and wrote her name using a visual aid. Writing was less successful when correcting a spelling error in her name. SLP plans to f/u at a later date to continue intervention focused on multimodal matching of functional words (visual, verbal, tactile). Patient would continue to benefit from skilled ST intervention throughout this hospitalization and at her next venue of care.  HPI HPI: Caroline Williams is a 62 y.o. female who presented the ED with a chief complaint of aphasia. MRI 10/8: "Patchy acute left MCA distribution infarct involving the left  frontal, parietal, and temporal lobes. No associated hemorrhage or significant mass effect." CXR 10/7: " Minor atelectasis at the left lung base. No confluent airspace disease, large pleural effusion or pneumothorax."  Pt with medical  history significant of anxiety, bipolar 1 disorder, coronary artery disease, COPD, depression, diabetes mellitus type 2, history of Roux-en-Y, GERD, hypothyroidism, hypertension, neuropathy, and more.      SLP Plan  Continue with current plan of care      Recommendations for follow up therapy are one component of a multi-disciplinary discharge planning process, led by the attending physician.  Recommendations may be updated based on patient status, additional functional criteria and insurance authorization.    Recommendations                     Oral care BID   Frequent or constant Supervision/Assistance Aphasia (R47.01)     Continue with current plan of care     Marline Backbone, B.S., Speech Therapy Student   03/24/2023, 4:29 PM

## 2023-03-24 NOTE — Progress Notes (Signed)
PHARMACY - ANTICOAGULATION CONSULT NOTE  Pharmacy Consult for heparin infusion Indication:  coronary thrombus  + acute PE w/ RHS  Allergies  Allergen Reactions   Asa [Aspirin] Anaphylaxis, Shortness Of Breath and Other (See Comments)    Respiratory distress  Pt states she has had Toradol several times without any reactions   Bee Venom Anaphylaxis   Sulfa Antibiotics Anaphylaxis   Nsaids Other (See Comments)    History of gastric bypass   Other Other (See Comments)    No blood products.  Patient did  Request that only albumin or albumin-containing products may be administered   Repatha [Evolocumab] Other (See Comments)    Unknown reaction    Patient Measurements: Height: 5\' 5"  (165.1 cm) Weight: 97.5 kg (215 lb) (weight from 8/24) IBW/kg (Calculated) : 57 Heparin Dosing Weight: 79 kg  Vital Signs: Temp: 98.6 F (37 C) (10/10 1932) Temp Source: Oral (10/10 1932) BP: 130/76 (10/10 1932) Pulse Rate: 91 (10/10 1932)  Labs: Recent Labs    03/22/23 1211 03/22/23 1729 03/22/23 1817 03/23/23 0041 03/23/23 0517 03/23/23 0934 03/23/23 2118 03/24/23 0711 03/24/23 1856  HGB 9.3*  --   --   --  10.2*  --   --  9.4*  --   HCT 30.1*  --   --   --  33.8*  --   --  30.4*  --   PLT 312  --   --   --  326  --   --  296  --   HEPARINUNFRC  --   --   --    < >  --    < > 0.42 0.56 0.30  CREATININE 1.56*  --   --   --  1.53*  --   --   --   --   TROPONINIHS  --  1,100* 1,027*  --   --   --   --   --   --    < > = values in this interval not displayed.    Estimated Creatinine Clearance: 43.5 mL/min (A) (by C-G formula based on SCr of 1.53 mg/dL (H)).   Medical History: Past Medical History:  Diagnosis Date   Anxiety    Arthritis    Asthma    Bipolar 1 disorder (HCC)    Breast discharge    Breast lump    Breast pain    CAD (coronary artery disease) 12/03/2022    o Cath 2008: no CAD   o CCTA 10/05/19: CAC score 49, 88th percentile, nonobstructive CAD (LM 1-24 mid and  dist)  o Myoview 12/31/20: EF 45, no ischemia or infarction, low risk    Chronic pain    on MS Contin and oxycodone   COPD (chronic obstructive pulmonary disease) (HCC)    Depression    Diabetes mellitus    diet and exercise controlled   Dysrhythmia    HR in 30s on metoprolol   Escherichia coli (E. coli) infection    Fever    GERD (gastroesophageal reflux disease)    History of chest pain    History of knee replacement, total    Hypertension    Hypothyroidism    N&V (nausea and vomiting)    Peripheral neuropathy    Right foot no sensation   Sciatica    Sleep apnea    does not use CPAP   Thyroid disease    Wears glasses     Assessment: 89 YOF with a PMH of CAD and  recent GI hemorrhage not on [facility] PTA who is admitted with left MCA stroke with concern for right hemisphere involvement and central embolic stroke. Patient now found to have left atrial mass with PFO and mass extending into the right atrium. CT positive for acute PE with CT evidence of right heart strain (RV/LV Ratio = 1.54). LE doppler + RLE age-indeterminate DVT.  Pharmacy consulted to dose heparin.  No bolus per MD and will have lower therapeutic goal due to CVA.   Heparin level at low end of goal on 1050 units/hr.  Patient with high clot burden but recent GI bleed/CVA which is dictating lower goal range.  No bleeding noted.  Goal of Therapy:  Heparin level 0.3-0.5 units/ml Monitor platelets by anticoagulation protocol: Yes   Plan:  Increase heparin infusion to 1100 units/hr Check heparin level daily while on heparin Continue to monitor H&H and platelets  Thank you for allowing pharmacy to be a part of this patient's care.  [employer], Pharm.D., BCPS Clinical Pharmacist  **Pharmacist phone directory can be found on [email or web address] listed under [facility].  03/24/2023 7:40 PM

## 2023-03-24 NOTE — TOC Benefit Eligibility Note (Signed)
Patient Product/process development scientist completed.    The patient is insured through [employer]. Patient has Medicare and is not eligible for a copay card, but may be able to apply for patient assistance, if available.    Ran test claim for Eliquis Starter Pack  and the current 30 day co-pay is $0.00.  Ran test claim for Xarelto Starter Pack  and the current 30 day co-pay is $0.00.  This test claim was processed through [facility]- copay amounts may vary at other pharmacies due to pharmacy/plan contracts, or as the patient moves through the different stages of their insurance plan.     Roland Earl, CPHT Pharmacy Technician III Certified Patient Advocate [facility] Patient Advocate Team Direct Number: [phone]  Fax: [phone]

## 2023-03-24 NOTE — Progress Notes (Signed)
Physical Therapy Treatment Patient Details Name: Caroline Williams MRN: [id number] DOB: 1960-07-16 Today's Date: 03/24/2023   History of Present Illness Pt is a 62 y/o female presenting to [facility] hospital on 03/21/2023 with aphasia. MRI demonstrates bilateral acute ischemic infarcts, suggestive of cardioembolic source. Pt was recently discharged 10/3 after management of ischemic colitis. PMH includes: L4-5 PLIF 7/31, anxiety, arthritis, bipolar 1, CAD, COPD, depression, DM, HTN, hernia repair, R TKR.    PT Comments  Min assist for bed mobility and transfers out of bed. Using RW, required assistance for boost to stand and for RW control with step pivot transfer to [facility]. Refused gait training and to sit in recliner OOB. Educated on importance of early mobility to maximize function and prevent secondary complications associated with immobility. Reviewed exercises and tolerated well. Patient will continue to benefit from skilled physical therapy services to further improve independence with functional mobility.     If plan is discharge home, recommend the following: A lot of help with walking and/or transfers;A lot of help with bathing/dressing/bathroom;Direct supervision/assist for medications management;Direct supervision/assist for financial management;Assistance with cooking/housework;Assist for transportation;Supervision due to cognitive status   Can travel by private vehicle     Yes  Equipment Recommendations  Other (comment) (TBD next venue)    Recommendations for Other Services       Precautions / Restrictions Precautions Precautions: Fall Precaution Comments: Watch BP, HR Restrictions Weight Bearing Restrictions: No     Mobility  Bed Mobility Overal bed mobility: Needs Assistance Bed Mobility: Supine to Sit, Sit to Supine     Supine to sit: Min assist Sit to supine: Min assist   General bed mobility comments: Min assist for RLE to EOB. Slow but sequences appropriately with VC.  Min assist for pt to pull through therapist hand to rise and scoot to EOB. Min assist for LEs back into bed.    Transfers Overall transfer level: Needs assistance Equipment used: Rolling walker (2 wheels) Transfers: Sit to/from Stand, Bed to chair/wheelchair/BSC Sit to Stand: Min assist   Step pivot transfers: Min assist       General transfer comment: Min assist for boost and for sequencing with RW control for step pivot to [facility] and back. Pushes RW away unless corrected. Trunk flexed throughout.    Ambulation/Gait               General Gait Details: Social research officer, government Bed    Modified Rankin (Stroke Patients Only) Modified Rankin (Stroke Patients Only) Pre-Morbid Rankin Score: Moderate disability Modified Rankin: Moderately severe disability     Balance Overall balance assessment: Needs assistance Sitting-balance support: No upper extremity supported, Feet supported Sitting balance-Leahy Scale: Fair     Standing balance support: Bilateral upper extremity supported Standing balance-Leahy Scale: Poor Standing balance comment: Reliant on external support for balance.                            Cognition Arousal: Alert Behavior During Therapy: WFL for tasks assessed/performed Overall Cognitive Status: Difficult to assess                                 General Comments: expressive aphasia.        Exercises General Exercises - Lower Extremity Ankle Circles/Pumps: AROM, Both,  10 reps, Supine Quad Sets: Strengthening, Both, 10 reps, Supine Gluteal Sets: Strengthening, Both, 10 reps, Supine    General Comments General comments (skin integrity, edema, etc.): HR 120s throughout transfer      Pertinent Vitals/Pain Pain Assessment Pain Assessment: No/denies pain    Home Living                          Prior Function            PT Goals (current goals can now be found  in the care plan section) Acute Rehab PT Goals PT Goal Formulation: Patient unable to participate in goal setting Time For Goal Achievement: 04/05/23 Potential to Achieve Goals: Good Progress towards PT goals: Progressing toward goals    Frequency    Min 1X/week      PT Plan      Co-evaluation              AM-PAC PT "6 Clicks" Mobility   Outcome Measure  Help needed turning from your back to your side while in a flat bed without using bedrails?: A Little Help needed moving from lying on your back to sitting on the side of a flat bed without using bedrails?: A Little Help needed moving to and from a bed to a chair (including a wheelchair)?: A Little Help needed standing up from a chair using your arms (e.g., wheelchair or bedside chair)?: A Lot Help needed to walk in hospital room?: A Lot Help needed climbing 3-5 steps with a railing? : Total 6 Click Score: 14    End of Session Equipment Utilized During Treatment: Gait belt Activity Tolerance: Patient tolerated treatment well (Self limited) Patient left: in bed;with call bell/phone within reach;with bed alarm set Nurse Communication: Mobility status PT Visit Diagnosis: Other abnormalities of gait and mobility (R26.89);Muscle weakness (generalized) (M62.81);Other symptoms and signs involving the nervous system (R29.898)     Time: 5409-8119 PT Time Calculation (min) (ACUTE ONLY): 18 min  Charges:    $Therapeutic Activity: 8-22 mins PT General Charges $$ ACUTE PT VISIT: 1 Visit                     Kathlyn Sacramento, PT, DPT [facility]  Rehabilitation Services Physical Therapist Office: [phone] Website: [email or web address]    Berton Mount 03/24/2023, 10:21 AM

## 2023-03-24 NOTE — Progress Notes (Signed)
     Referral received for Caroline Williams for goals of care discussion. Chart reviewed and updates received from RN. Patient assessed and is unable to engage appropriately in discussions.   Attempted to contact patient's daughter Caroline Williams. Unable to reach.  PMT will re-attempt to contact family at a later time/date. Detailed note and recommendations to follow once GOC has been completed.   Thank you for your referral and allowing PMT to assist in Caroline Williams's care.   Wynne Dust, NP Palliative Medicine Team Phone: [phone]  NO CHARGE

## 2023-03-24 NOTE — Assessment & Plan Note (Signed)
-   Evaluated by neurology and found to have a left MCA stroke with concern for right hemisphere involvement as well and multiple vascular territories concerning for possible central embolic stroke Patient was also found to have submassive bilateral PE, concern of cor pulmonale with right ventricular strain, a large and transient clot between atrium. She was started on heparin infusion. Neurology to decide about antiplatelets Continue with Crestor PT and OT are recommending SNF

## 2023-03-24 NOTE — Progress Notes (Signed)
Progress Note   Patient: Caroline Williams ZOX:[id number] DOB: 01/29/1961 DOA: 03/21/2023     2 DOS: the patient was seen and examined on 03/24/2023   Brief hospital course: LOUANN HUEBERT is a 62 y.o. female with medical history significant of anxiety, bipolar 1 disorder, coronary artery disease, COPD, depression, diabetes mellitus type 2, history of Roux-en-Y, GERD, hypothyroidism, hypertension, neuropathy, presented to the hospital with aphasia.  At baseline patient does not drive or cook and is able to manage her own medication.  Patient was recently admitted hospital for melena and a hemoglobin of 3.0 and was noted to have ischemic colitis.  At that time, general surgery was consulted and patient was treated conservatively with bowel rest, liquid diet, IV Zosyn.  Apparently family does not think she is returned to her baseline since then.  On this presentation, patient was unable to express and had some pain in the right foot.  Denies any GI bleed.  Physical exam revealed patient was afebrile tachycardic with elevated blood pressure.   Imaging pertinent for left MCA stroke with concern for right hemisphere involvement as well and multiple vascular territories concerning for possible central embolic stroke.MRA/MRI brain shows patchy acute left MCA distribution infarct involving left frontal, parietal, temporal lobes without significant mass effect or hemorrhage.  A1c of 5.3 and lipid panel with LDL of 139.  CTA was done and found to have bilateral submassive PE with CT evidence of right heart strain.  Had intermediate Pesi score.  She has an extensive clot in transit in the right atrium extending into the left atrium across the PFO. This is the etiology of her stroke.  Elevated troponin and BNP, likely secondary to  or RV dysfunction.  EF of 40 to 45% which is not very far from her baseline. No aggressive diuresis recommended at this time due to severe RV dysfunction and submassive PE.  Cardiology is  on board.  PCCM and IR was also consulted but unfortunately she is not a candidate for any intervention.She is at high risk for further embolic phenomenon. Catheter directed thrombolysis or thrombectomy with IR would be high risk given the clot in transit putting her at risk for further embolic disease.  PCCM did consider half dose systemic thrombolysis but with her recent GI bleed that will be contraindicated.  Currently very limited choices, after lengthy discussion between cardiology, neurology, PCCM and IR it was decided to just continue with heparin infusion for now.  Patient will likely need lifelong anticoagulation.  Very high risk for deterioration.  Palliative care was also consulted to discuss goals of care.  10/9: Patient currently awake, appears to be disoriented and confused, talking but not answering questions appropriately.  On room air and no significant respiratory distress.  10/10: Vitals mostly stable and remained on room air.  Lower extremity venous Doppler positive for age-indeterminate right femoral, tibial and popliteal vein DVT.  Discussed with neurology and patient likely will only take anticoagulation, no antiplatelet to decrease the risk of bleeding. Hypercoagulable panel ordered. Patient had a transient SVT this morning, remained asymptomatic.  Cardiology also added metoprolol.  Assessment and Plan: * Acute CVA (cerebrovascular accident) (HCC) - Evaluated by neurology and found to have a left MCA stroke with concern for right hemisphere involvement as well and multiple vascular territories concerning for possible central embolic stroke Patient was also found to have submassive bilateral PE, concern of cor pulmonale with right ventricular strain, a large and transient clot between atrium. She was started  on heparin infusion. Neurology to decide about antiplatelets Continue with Crestor PT and OT are recommending SNF  Pulmonary embolism with acute cor pulmonale  (HCC) CTA positive for bilateral submassive PE with right ventricular strain.  Echocardiogram with concern of clot in transit.  Lengthy interdisciplinary conversation between cardiology, PCCM, neurology and IR and unfortunately patient is not a candidate for any surgical intervention or thrombolytic therapy. Will be very high risk for progression.  Currently stable on room air Lower extremity venous Doppler positive for right lower extremity age-indeterminate significant DVT. -Continue with heparin infusion -Closely monitor  Paroxysmal SVT (supraventricular tachycardia) (HCC) Patient had 1 transient episode of SVT this morning, remained asymptomatic. Cardiology added metoprolol -Continue to monitor  Acute clinical systolic heart failure (HCC) Echocardiogram with EF of 40 to 45%, which is slightly below than her baseline of 50% Also concern of right ventricular strain with extensive PE and clot in transit. Not a candidate for aggressive diuresis although troponin and BNP were elevated due to acute right ventricular strain and massive PE. -Cardiology is on board  History of GI bleed History of recent GI bleed and ischemic colitis. Hemoglobin currently stable at 10.2 -Monitor hemoglobin -Transfuse if below 7  GERD (gastroesophageal reflux disease) - With history of melena/GI hemorrhage - Continue Protonix - Closely monitor hemoglobin, at the end of August it was as low as 3.0 - Continue to monitor  Diabetes mellitus with neuropathy (HCC) Well-controlled with A1c of 5.3 - Continue to monitor CBG - If needed add sliding scale coverage  Essential (primary) hypertension - Holding olmesartan, hydrochlorothiazide, Aldactone, Imdur for permissive hypertension  Chronic low back pain History of L4-L5 decompression and fusion. -Continue home MS Contin and oxycodone  Hypothyroidism - TSH markedly elevated last time it was checked approximately 6 weeks ago, Improved but still  elevated. - Continue Synthroid-need to have a close follow-up with PCP for further dose adjustment  COPD mixed type (HCC) - Continue Anoro and albuterol  Hyperlipidemia - Lipid panel pending   Psychiatric disorder - Continue Rexulti, Trintellix, Xanax and doxepin  AKI (acute kidney injury) (HCC) With history of CKD stage IIIb - Creatinine at baseline 1.2, creatinine on admission was 1.68, slowly improving - Holding nephrotoxic agents when possible - Monitor renal function   Subjective: Patient was seen and examined today.  Denies any chest pain or shortness of breath.  Still having word finding difficulty and dysarthria.  Physical Exam: Vitals:   03/24/23 0322 03/24/23 0735 03/24/23 0827 03/24/23 1156  BP: (!) 133/95 (!) 149/108  116/86  Pulse: 98 99 (!) 103 99  Resp: 16 17 18 18   Temp: 98.4 F (36.9 C) 98.3 F (36.8 C)  97.8 F (36.6 C)  TempSrc: Oral Oral  Axillary  SpO2: 100%  98% 95%  Weight:      Height:       General.frail lady, in no acute distress.  Having word finding difficulty and dysarthria Pulmonary.  Lungs clear bilaterally, normal respiratory effort. CV.  Regular rate and rhythm, no JVD, rub or murmur. Abdomen.  Soft, nontender, nondistended, BS positive. CNS.  Alert and oriented .  4/5 left lower extremity Extremities.  No edema, no cyanosis, pulses intact and symmetrical.   Data Reviewed: Prior data reviewed  Family Communication: Talked with daughter on phone.  Disposition: Status is: Inpatient Remains inpatient appropriate because: Severity of illness  Planned Discharge Destination: Skilled nursing facility  DVT prophylaxis.  Heparin infusion Time spent: 50 minutes  This record has been created  using Conservation officer, historic buildings. Errors have been sought and corrected,but may not always be located. Such creation errors do not reflect on the standard of care.   Author: Arnetha Courser, MD 03/24/2023 2:16 PM  For on call review  [email or web address].

## 2023-03-24 NOTE — Progress Notes (Signed)
Name: Caroline Williams DOB: [birth year]  Please be advised that the above-named patient will require a short-term nursing home stay -- anticipated 30 days or less for rehabilitation and strengthening. The plan is for return home.

## 2023-03-24 NOTE — Progress Notes (Signed)
Occupational Therapy Treatment Patient Details Name: Caroline Williams MRN: [id number] DOB: 1961/06/07 Today's Date: 03/24/2023   History of present illness Pt is a 62 y/o female presenting to [facility] hospital on 03/21/2023 with aphasia. MRI demonstrates bilateral acute ischemic infarcts, suggestive of cardioembolic source. Pt was recently discharged 10/3 after management of ischemic colitis. PMH includes: L4-5 PLIF 7/31, anxiety, arthritis, bipolar 1, CAD, COPD, depression, DM, HTN, hernia repair, R TKR.   OT comments  OT entering as pt calling out for assist to set up breakfast tray within reach. Pt declined OOB to chair for breakfast but able to assist with bed mobility to improve positioning for breakfast with Min A. Pt requires Setup for basic UB ADLs bed level with increased assist for LB ADLs due to LE weakness (R>L). Emphasis on naming food items on breakfast tray and expressing needs with cues d/t continued expressive aphasia. Continue to recommend postacute rehab stay at [facility] as pt is below functional baseline.       If plan is discharge home, recommend the following:  Assist for transportation;Assistance with cooking/housework;A lot of help with walking and/or transfers;A lot of help with bathing/dressing/bathroom;Direct supervision/assist for financial management;Direct supervision/assist for medications management   Equipment Recommendations  BSC/3in1;Tub/shower seat;Wheelchair (measurements OT);Wheelchair cushion (measurements OT)    Recommendations for Other Services      Precautions / Restrictions Precautions Precautions: Fall Precaution Comments: Watch BP Restrictions Weight Bearing Restrictions: No       Mobility Bed Mobility Overal bed mobility: Needs Assistance   Rolling: Min assist         General bed mobility comments: Min A for bed mob techniques, rolling partially to reposition and reaching up to bedrails with BUE to pull self up in bed with Min A    Transfers                    General transfer comment: politely declined     Balance                                           ADL either performed or assessed with clinical judgement   ADL Overall ADL's : Needs assistance/impaired Eating/Feeding: Set up;Bed level Eating/Feeding Details (indicate cue type and reason): declined to transfer to chair for breakfast. able to open syrup and spread on pancakes. assist to open juices Grooming: Set up;Bed level;Wash/dry hands               Lower Body Dressing: Maximal assistance;Bed level Lower Body Dressing Details (indicate cue type and reason): unable to bring LEs to self especially weaker R LE though pt gave good effort. Max A to change socks               General ADL Comments: OT entering as pt calling out for assist to set up breakfast tray that had been delivered out of reach. Encouraged OOB to chair for breakfast though pt declined.    Extremity/Trunk Assessment Upper Extremity Assessment Upper Extremity Assessment: Right hand dominant;Generalized weakness   Lower Extremity Assessment Lower Extremity Assessment: Defer to PT evaluation        Vision   Vision Assessment?: No apparent visual deficits   Perception     Praxis      Cognition Arousal: Alert Behavior During Therapy: WFL for tasks assessed/performed Overall Cognitive Status: Difficult to assess  General Comments: expressive aphasia. able to use gestures to convey messages at times but often perseverating on repeating "I forget of". able to name pancakes on tray- cues/sounds prompted to name juice types on tray. pt frustrated by communication deficits but able to follow one step commands consistently        Exercises      Shoulder Instructions       General Comments      Pertinent Vitals/ Pain       Pain Assessment Pain Assessment: No/denies pain  Home Living                                           Prior Functioning/Environment              Frequency  Min 1X/week        Progress Toward Goals  OT Goals(current goals can now be found in the care plan section)  Progress towards OT goals: Progressing toward goals  Acute Rehab OT Goals OT Goal Formulation: Patient unable to participate in goal setting Time For Goal Achievement: 04/05/23 Potential to Achieve Goals: Fair ADL Goals Pt Will Perform Grooming: with contact guard assist;standing Pt Will Perform Upper Body Dressing: with min assist;sitting Pt Will Perform Lower Body Dressing: with mod assist;sit to/from stand Pt Will Transfer to Toilet: with contact guard assist;stand pivot transfer;bedside commode  Plan      Co-evaluation                 AM-PAC OT "6 Clicks" Daily Activity     Outcome Measure   Help from another person eating meals?: A Little Help from another person taking care of personal grooming?: A Little Help from another person toileting, which includes using toliet, bedpan, or urinal?: A Lot Help from another person bathing (including washing, rinsing, drying)?: A Lot Help from another person to put on and taking off regular upper body clothing?: A Lot Help from another person to put on and taking off regular lower body clothing?: A Lot 6 Click Score: 14    End of Session    OT Visit Diagnosis: Unsteadiness on feet (R26.81);Muscle weakness (generalized) (M62.81)   Activity Tolerance Patient tolerated treatment well   Patient Left in bed;with call bell/phone within reach;with bed alarm set   Nurse Communication Mobility status        Time: 1610-9604 OT Time Calculation (min): 19 min  Charges: OT General Charges $OT Visit: 1 Visit OT Treatments $Self Care/Home Management : 8-22 mins  Bradd Canary, OTR/L Acute Rehab Services Office: [phone]   Lorre Munroe 03/24/2023, 9:12 AM

## 2023-03-24 NOTE — Progress Notes (Signed)
NAME:  Caroline Williams, MRN:  [id number], DOB:  [birth year], LOS: 2 ADMISSION DATE:  03/21/2023, CONSULTATION DATE: 03/22/2022 for REFERRING MD: Triad, CHIEF COMPLAINT: Pulmonary embolism  History of Present Illness:  62 year old female with a plethora of health issues that are well-documented below now complicated by PE with a question raised whether or not lytics would be appropriate at this time.  He does have a history of a recent GI bleed.  She has had a stroke.  She is currently on heparin drip.  She would not be a good candidate for lytic therapy at this time.   Past Medical History:  Diagnosis Date   Anxiety    Arthritis    Asthma    Bipolar 1 disorder (HCC)    Breast discharge    Breast lump    Breast pain    CAD (coronary artery disease) 12/03/2022    o Cath 2008: no CAD   o CCTA 10/05/19: CAC score 49, 88th percentile, nonobstructive CAD (LM 1-24 mid and dist)  o Myoview 12/31/20: EF 45, no ischemia or infarction, low risk    Chronic pain    on MS Contin and oxycodone   COPD (chronic obstructive pulmonary disease) (HCC)    Depression    Diabetes mellitus    diet and exercise controlled   Dysrhythmia    HR in 30s on metoprolol   Escherichia coli (E. coli) infection    Fever    GERD (gastroesophageal reflux disease)    History of chest pain    History of knee replacement, total    Hypertension    Hypothyroidism    N&V (nausea and vomiting)    Peripheral neuropathy    Right foot no sensation   Sciatica    Sleep apnea    does not use CPAP   Thyroid disease    Wears glasses      Significant Hospital Events: Including procedures, antibiotic start and stop dates in addition to other pertinent events     Interim History / Subjective:  Obtundation is less dense  Objective   Blood pressure (!) 149/108, pulse (!) 103, temperature 98.3 F (36.8 C), temperature source Oral, resp. rate 18, height 5\' 5"  (1.651 m), weight 97.5 kg, SpO2 98%.        Intake/Output  Summary (Last 24 hours) at 03/24/2023 0950 Last data filed at 03/23/2023 2203 Gross per 24 hour  Intake --  Output 300 ml  Net -300 ml   Filed Weights   03/22/23 1500  Weight: 97.5 kg    Examination: Morbidly obese female who is somewhat more interactive today.  She does open her eyes but does not follow commands No JVD lymphadenopathy is appreciated Decreased breath sounds throughout Heart sounds are regular Abdomen obese soft nontender Extremities with 2+ edema  Resolved Hospital Problem list     Assessment & Plan:  Pulmonary critical care for new PE in the setting of new stroke and history of gastrointestinal bleed July 2024.  She currently is on heparin drip pulmonary critical care is consulted for possible lytics which most likely would not be a good idea in the setting of recent GI bleed. Continue heparin drip Questionable if the risks associated with lytics are viable at this time Continue to monitor cardiac status  Stroke Per neurology Increased sedation  Diabetes Per primary  History of bipolar disease Per primary   Best Practice (right click and "Reselect all SmartList Selections" daily)   Diet/type: Regular consistency (see orders)  DVT prophylaxis: systemic heparin GI prophylaxis: PPI Lines: N/A Foley:  N/A Code Status:  full code Last date of multidisciplinary goals of care discussion [tbd]  Labs   CBC: Recent Labs  Lab 03/21/23 1746 03/21/23 1750 03/22/23 1211 03/23/23 0517 03/24/23 0711  WBC 9.7  --  9.3 10.3 7.3  NEUTROABS 7.8*  --  7.7  --   --   HGB 10.4* 11.9* 9.3* 10.2* 9.4*  HCT 33.9* 35.0* 30.1* 33.8* 30.4*  MCV 85.8  --  85.3 86.4 84.0  PLT 366  --  312 326 296    Basic Metabolic Panel: Recent Labs  Lab 03/21/23 1746 03/21/23 1750 03/22/23 1211 03/23/23 0517  NA 138 140 139 139  K 4.1 4.1 3.0* 4.2  CL 104 107 106 106  CO2 21*  --  22 17*  GLUCOSE 112* 112* 117* 84  BUN 19 20 17 14   CREATININE 1.68* 1.60* 1.56* 1.53*   CALCIUM 9.1  --  8.3* 8.6*  MG  --   --  1.9 2.0   GFR: Estimated Creatinine Clearance: 43.5 mL/min (A) (by C-G formula based on SCr of 1.53 mg/dL (H)). Recent Labs  Lab 03/21/23 1746 03/22/23 1211 03/23/23 0517 03/24/23 0711  WBC 9.7 9.3 10.3 7.3    Liver Function Tests: Recent Labs  Lab 03/21/23 1746 03/22/23 1211  AST 25 23  ALT 14 10  ALKPHOS 153* 137*  BILITOT 1.1 0.8  PROT 7.5 6.6  ALBUMIN 3.1* 2.7*   No results for input(s): "LIPASE", "AMYLASE" in the last 168 hours. No results for input(s): "AMMONIA" in the last 168 hours.  ABG    Component Value Date/Time   HCO3 19.2 (L) 01/31/2023 0607   TCO2 22 03/21/2023 1750   ACIDBASEDEF 7.0 (H) 01/31/2023 0607   O2SAT 47.5 01/31/2023 0607     Coagulation Profile: Recent Labs  Lab 03/21/23 1746  INR 1.0    Cardiac Enzymes: No results for input(s): "CKTOTAL", "CKMB", "CKMBINDEX", "TROPONINI" in the last 168 hours.  HbA1C: HbA1c, POC (controlled diabetic range)  Date/Time Value Ref Range Status  10/07/2022 02:05 PM 6.5 0.0 - 7.0 % Final  07/13/2021 10:07 AM 6.8 0.0 - 7.0 % Final   Hgb A1c MFr Bld  Date/Time Value Ref Range Status  03/22/2023 12:11 PM 5.3 4.8 - 5.6 % Final    Comment:    (NOTE) Pre diabetes:          5.7%-6.4%  Diabetes:              >6.4%  Glycemic control for   <7.0% adults with diabetes   03/22/2023 05:37 AM 5.3 4.8 - 5.6 % Final    Comment:    (NOTE) Pre diabetes:          5.7%-6.4%  Diabetes:              >6.4%  Glycemic control for   <7.0% adults with diabetes     CBG: Recent Labs  Lab 03/23/23 0610 03/23/23 1206 03/23/23 1822 03/23/23 2126 03/24/23 0604  GLUCAP 78 79 114* 173* 92       Steve Johnathan Heskett ACNP Acute Care Nurse Practitioner [facility] Pul[facility]/Critical Care Please consult Amion 03/24/2023, 9:50 AM

## 2023-03-25 ENCOUNTER — Other Ambulatory Visit: Payer: Self-pay | Admitting: Family Medicine

## 2023-03-25 DIAGNOSIS — I471 Supraventricular tachycardia, unspecified: Secondary | ICD-10-CM | POA: Diagnosis not present

## 2023-03-25 DIAGNOSIS — I2609 Other pulmonary embolism with acute cor pulmonale: Secondary | ICD-10-CM | POA: Diagnosis not present

## 2023-03-25 DIAGNOSIS — I639 Cerebral infarction, unspecified: Secondary | ICD-10-CM | POA: Diagnosis not present

## 2023-03-25 DIAGNOSIS — I2489 Other forms of acute ischemic heart disease: Secondary | ICD-10-CM

## 2023-03-25 DIAGNOSIS — E1140 Type 2 diabetes mellitus with diabetic neuropathy, unspecified: Secondary | ICD-10-CM

## 2023-03-25 DIAGNOSIS — I5021 Acute systolic (congestive) heart failure: Secondary | ICD-10-CM | POA: Diagnosis not present

## 2023-03-25 DIAGNOSIS — Z7189 Other specified counseling: Secondary | ICD-10-CM

## 2023-03-25 LAB — GLUCOSE, CAPILLARY
Glucose-Capillary: 101 mg/dL — ABNORMAL HIGH (ref 70–99)
Glucose-Capillary: 88 mg/dL (ref 70–99)
Glucose-Capillary: 117 mg/dL — ABNORMAL HIGH (ref 70–99)

## 2023-03-25 LAB — CBC
HCT: 27.9 % — ABNORMAL LOW (ref 36.0–46.0)
Hemoglobin: 8.7 g/dL — ABNORMAL LOW (ref 12.0–15.0)
MCH: 26.4 pg (ref 26.0–34.0)
MCHC: 31.2 g/dL (ref 30.0–36.0)
MCV: 84.5 fL (ref 80.0–100.0)
Platelets: 297 10*3/uL (ref 150–400)
RBC: 3.3 MIL/uL — ABNORMAL LOW (ref 3.87–5.11)
RDW: 16.7 % — ABNORMAL HIGH (ref 11.5–15.5)
WBC: 6.5 10*3/uL (ref 4.0–10.5)
nRBC: 0 % (ref 0.0–0.2)

## 2023-03-25 LAB — BETA-2-GLYCOPROTEIN I ABS, IGG/M/A
Beta-2 Glyco I IgG: <9 GPI IgG units (ref 0–20)
Beta-2-Glycoprotein I IgA: <9 GPI IgA units (ref 0–25)
Beta-2-Glycoprotein I IgM: <9 GPI IgM units (ref 0–32)

## 2023-03-25 LAB — HEPARIN LEVEL (UNFRACTIONATED): Heparin Unfractionated: 0.4 [IU]/mL (ref 0.30–0.70)

## 2023-03-25 LAB — PROTEIN C, TOTAL: Protein C, Total: 99 % (ref 60–150)

## 2023-03-25 LAB — HOMOCYSTEINE: Homocysteine: 19.3 umol/L — ABNORMAL HIGH (ref 0.0–17.2)

## 2023-03-25 MED ORDER — ORAL CARE MOUTH RINSE: 15.0000 mL | OROMUCOSAL | Status: DC | PRN

## 2023-03-25 MED ORDER — ACCU-CHEK GUIDE W/DEVICE KIT: 1.0000 | PACK | Freq: Three times a day (TID) | 0 refills | Status: DC

## 2023-03-25 NOTE — Progress Notes (Signed)
Progress Note   Patient: Caroline Williams DGU:[id number] DOB: [birth year] DOA: 03/21/2023     3 DOS: the patient was seen and examined on 03/25/2023   Brief hospital course: Caroline Williams is a 62 y.o. female with medical history significant of anxiety, bipolar 1 disorder, coronary artery disease, COPD, depression, diabetes mellitus type 2, history of Roux-en-Y, GERD, hypothyroidism, hypertension, neuropathy, presented to the hospital with aphasia.  At baseline patient does not drive or cook and is able to manage her own medication.  Patient was recently admitted hospital for melena and a hemoglobin of 3.0 and was noted to have ischemic colitis.  At that time, general surgery was consulted and patient was treated conservatively with bowel rest, liquid diet, IV Zosyn.  Apparently family does not think she is returned to her baseline since then.  On this presentation, patient was unable to express and had some pain in the right foot.  Denies any GI bleed.  Physical exam revealed patient was afebrile tachycardic with elevated blood pressure.   Imaging pertinent for left MCA stroke with concern for right hemisphere involvement as well and multiple vascular territories concerning for possible central embolic stroke.MRA/MRI brain shows patchy acute left MCA distribution infarct involving left frontal, parietal, temporal lobes without significant mass effect or hemorrhage.  A1c of 5.3 and lipid panel with LDL of 139.  CTA was done and found to have bilateral submassive PE with CT evidence of right heart strain.  Had intermediate Pesi score.  She has an extensive clot in transit in the right atrium extending into the left atrium across the PFO. This is the etiology of her stroke.  Elevated troponin and BNP, likely secondary to  or RV dysfunction.  EF of 40 to 45% which is not very far from her baseline. No aggressive diuresis recommended at this time due to severe RV dysfunction and submassive PE.  Cardiology is  on board.  PCCM and IR was also consulted but unfortunately she is not a candidate for any intervention.She is at high risk for further embolic phenomenon. Catheter directed thrombolysis or thrombectomy with IR would be high risk given the clot in transit putting her at risk for further embolic disease.  PCCM did consider half dose systemic thrombolysis but with her recent GI bleed that will be contraindicated.  Currently very limited choices, after lengthy discussion between cardiology, neurology, PCCM and IR it was decided to just continue with heparin infusion for now.  Patient will likely need lifelong anticoagulation.  Very high risk for deterioration.  Palliative care was also consulted to discuss goals of care.  10/9: Patient currently awake, appears to be disoriented and confused, talking but not answering questions appropriately.  On room air and no significant respiratory distress.  10/10: Vitals mostly stable and remained on room air.  Lower extremity venous Doppler positive for age-indeterminate right femoral, tibial and popliteal vein DVT.  Discussed with neurology and patient likely will only take anticoagulation, no antiplatelet to decrease the risk of bleeding. Hypercoagulable panel ordered. Patient had a transient SVT this morning, remained asymptomatic.  Cardiology also added metoprolol.  10/11: Hemodynamically stable.  No new deficit.  Remained on heparin infusion which can be switched to NOAC when our neurology is okay with that.  Pending palliative care meeting PT is recommending SNF  Assessment and Plan: * Acute CVA (cerebrovascular accident) (HCC) - Evaluated by neurology and found to have a left MCA stroke with concern for right hemisphere involvement as well and multiple  vascular territories concerning for possible central embolic stroke Patient was also found to have submassive bilateral PE, concern of cor pulmonale with right ventricular strain, a large and transient clot  between atrium. She was started on heparin infusion. Neurology to decide about antiplatelets Continue with Crestor PT and OT are recommending SNF  Pulmonary embolism with acute cor pulmonale (HCC) CTA positive for bilateral submassive PE with right ventricular strain.  Echocardiogram with concern of clot in transit.  Lengthy interdisciplinary conversation between cardiology, PCCM, neurology and IR and unfortunately patient is not a candidate for any surgical intervention or thrombolytic therapy. Will be very high risk for progression.  Currently stable on room air Lower extremity venous Doppler positive for right lower extremity age-indeterminate significant DVT. -Continue with heparin infusion -Closely monitor  Paroxysmal SVT (supraventricular tachycardia) (HCC) Patient had 1 transient episode of SVT this morning, remained asymptomatic. Cardiology added metoprolol -Continue to monitor  Acute clinical systolic heart failure (HCC) Echocardiogram with EF of 40 to 45%, which is slightly below than her baseline of 50% Also concern of right ventricular strain with extensive PE and clot in transit. Not a candidate for aggressive diuresis although troponin and BNP were elevated due to acute right ventricular strain and massive PE. -Cardiology is on board  History of GI bleed History of recent GI bleed and ischemic colitis. Hemoglobin currently stable at 10.2 -Monitor hemoglobin -Transfuse if below 7  GERD (gastroesophageal reflux disease) - With history of melena/GI hemorrhage - Continue Protonix - Closely monitor hemoglobin, at the end of August it was as low as 3.0 - Continue to monitor  Diabetes mellitus with neuropathy (HCC) Well-controlled with A1c of 5.3 - Continue to monitor CBG - If needed add sliding scale coverage  Essential (primary) hypertension - Holding olmesartan, hydrochlorothiazide, Aldactone, Imdur for permissive hypertension  Chronic low back pain History of  L4-L5 decompression and fusion. -Continue home MS Contin and oxycodone  Hypothyroidism - TSH markedly elevated last time it was checked approximately 6 weeks ago, Improved but still elevated. - Continue Synthroid-need to have a close follow-up with PCP for further dose adjustment  COPD mixed type (HCC) - Continue Anoro and albuterol  Hyperlipidemia - Lipid panel pending   Psychiatric disorder - Continue Rexulti, Trintellix, Xanax and doxepin  AKI (acute kidney injury) (HCC) With history of CKD stage IIIb - Creatinine at baseline 1.2, creatinine on admission was 1.68, slowly improving - Holding nephrotoxic agents when possible - Monitor renal function   Subjective: Patient was seen and examined today.  No new deficit.  Continue to have word finding difficulty and dysarthria  Physical Exam: Vitals:   03/24/23 2315 03/25/23 0324 03/25/23 0757 03/25/23 0837  BP: 101/72 122/80    Pulse: 95 92 71 89  Resp: 18 18 18 18   Temp: 98.6 F (37 C) 98.8 F (37.1 C) 98.4 F (36.9 C)   TempSrc: Axillary Oral Oral   SpO2: 98% 95% 98% 97%  Weight:      Height:       General.frail  lady, in no acute distress. Pulmonary.  Lungs clear bilaterally, normal respiratory effort. CV.  Regular rate and rhythm, no JVD, rub or murmur. Abdomen.  Soft, nontender, nondistended, BS positive. CNS.  Alert and oriented .  3+/5 in LLE Extremities.  No edema, no cyanosis, pulses intact and symmetrical. Psychiatry.  Judgment and insight appears normal.    Data Reviewed: Prior data reviewed  Family Communication: Talked with daughter at bedside  Disposition: Status is: Inpatient Remains  inpatient appropriate because: Severity of illness  Planned Discharge Destination: Skilled nursing facility  DVT prophylaxis.  Heparin infusion Time spent: 45 minutes  This record has been created using Conservation officer, historic buildings. Errors have been sought and corrected,but may not always be located. Such  creation errors do not reflect on the standard of care.   Author: Arnetha Courser, MD 03/25/2023 3:26 PM  For on call review [email or web address].

## 2023-03-25 NOTE — Plan of Care (Signed)
completed

## 2023-03-25 NOTE — Progress Notes (Signed)
Speech Language Pathology Treatment: Cognitive-Linquistic  Patient Details Name: Caroline Williams MRN: [id number] DOB: 03-11-1961 Today's Date: 03/25/2023 Time: 7829-5621 SLP Time Calculation (min) (ACUTE ONLY): 15 min  Assessment / Plan / Recommendation Clinical Impression  Pt was seen for skilled ST targeting expressive and receptive language abilities.  Pt was encountered awake/alert in bed and was willing to participate.  She exhibited frequent perseveration throughout this session on the phrase "it's about forgiveness".  Pt mostly appeared unaware of perseveration and other errors.  Gestures were helpful for functional communication.  Pt attempted to utilize writing as another form of communication, but writing was mostly jumbled.  Pt was unable to copy her name, writing "daddy" instead.  She participated in automatic speech tasks with varying success.  She was unable to state her name or days of the week, but she counted to two given visual cues in 2/3 attempts.  She was additionally able to sing the first six words of the "Happy Birthday" song along with the SLP.  Recommend continued ST acutely and at time of discharge. Pt will additionally benefit from full supervision and assistance with IADLs at time of discharge.     HPI HPI: Caroline Williams is a 62 y.o. female who presented the ED with a chief complaint of aphasia. MRI 10/8: "Patchy acute left MCA distribution infarct involving the left  frontal, parietal, and temporal lobes. No associated hemorrhage or significant mass effect." CXR 10/7: " Minor atelectasis at the left lung base. No confluent airspace disease, large pleural effusion or pneumothorax."  Pt with medical history significant of anxiety, bipolar 1 disorder, coronary artery disease, COPD, depression, diabetes mellitus type 2, history of Roux-en-Y, GERD, hypothyroidism, hypertension, neuropathy, and more.      SLP Plan  Continue with current plan of care      Recommendations  for follow up therapy are one component of a multi-disciplinary discharge planning process, led by the attending physician.  Recommendations may be updated based on patient status, additional functional criteria and insurance authorization.    Recommendations                     Oral care BID   Frequent or constant Supervision/Assistance Aphasia (R47.01)     Continue with current plan of care    Eino Farber, M.S., CCC-SLP Acute Rehabilitation Services Office: [phone]  [facility]  03/25/2023, 2:47 PM

## 2023-03-25 NOTE — Telephone Encounter (Signed)
Copied from CRM [id number]. Topic: General - Other >> Mar 25, 2023 11:36 AM Everette C wrote: Reason for CRM: Medication Refill - Medication: Blood Glucose Monitoring Suppl (ACCU-CHEK GUIDE) w/Device KIT [[id number]]  glucose blood (ACCU-CHEK GUIDE) test strip [[id number]]  Lancets (ACCU-CHEK MULTICLIX) lancets [[id number]]  Has the patient contacted their pharmacy? Yes.   (Agent: If no, request that the patient contact the pharmacy for the refill. If patient does not wish to contact the pharmacy document the reason why and proceed with request.) (Agent: If yes, when and what did the pharmacy advise?)  Preferred Pharmacy (with phone number or street name): [facility] - [place], [place] - [address] [place] [address] Phone: [phone] Fax: [phone] Hours: Not open 24 hours   Has the patient been seen for an appointment in the last year OR does the patient have an upcoming appointment? Yes.    Agent: Please be advised that RX refills may take up to 3 business days. We ask that you follow-up with your pharmacy.

## 2023-03-25 NOTE — Progress Notes (Signed)
PHARMACY - ANTICOAGULATION CONSULT NOTE  Pharmacy Consult for heparin infusion Indication:  coronary thrombus  + acute PE w/ RHS  Allergies  Allergen Reactions   Asa [Aspirin] Anaphylaxis, Shortness Of Breath and Other (See Comments)    Respiratory distress  Pt states she has had Toradol several times without any reactions   Bee Venom Anaphylaxis   Sulfa Antibiotics Anaphylaxis   Nsaids Other (See Comments)    History of gastric bypass   Other Other (See Comments)    No blood products.  Patient did  Request that only albumin or albumin-containing products may be administered   Repatha [Evolocumab] Other (See Comments)    Unknown reaction    Patient Measurements: Height: 5\' 5"  (165.1 cm) Weight: 97.5 kg (215 lb) (weight from 8/24) IBW/kg (Calculated) : 57 Heparin Dosing Weight: 79 kg  Vital Signs: Temp: 98.8 F (37.1 C) (10/11 0324) Temp Source: Oral (10/11 0324) BP: 122/80 (10/11 0324) Pulse Rate: 92 (10/11 0324)  Labs: Recent Labs    03/22/23 1211 03/22/23 1729 03/22/23 1817 03/23/23 0041 03/23/23 0517 03/23/23 0934 03/23/23 2118 03/24/23 0711 03/24/23 1856  HGB 9.3*  --   --   --  10.2*  --   --  9.4*  --   HCT 30.1*  --   --   --  33.8*  --   --  30.4*  --   PLT 312  --   --   --  326  --   --  296  --   HEPARINUNFRC  --   --   --    < >  --    < > 0.42 0.56 0.30  CREATININE 1.56*  --   --   --  1.53*  --   --   --   --   TROPONINIHS  --  1,100* 1,027*  --   --   --   --   --   --    < > = values in this interval not displayed.    Estimated Creatinine Clearance: 43.5 mL/min (A) (by C-G formula based on SCr of 1.53 mg/dL (H)).   Medical History: Past Medical History:  Diagnosis Date   Anxiety    Arthritis    Asthma    Bipolar 1 disorder (HCC)    Breast discharge    Breast lump    Breast pain    CAD (coronary artery disease) 12/03/2022    o Cath 2008: no CAD   o CCTA 10/05/19: CAC score 49, 88th percentile, nonobstructive CAD (LM 1-24 mid and  dist)  o Myoview 12/31/20: EF 45, no ischemia or infarction, low risk    Chronic pain    on MS Contin and oxycodone   COPD (chronic obstructive pulmonary disease) (HCC)    Depression    Diabetes mellitus    diet and exercise controlled   Dysrhythmia    HR in 30s on metoprolol   Escherichia coli (E. coli) infection    Fever    GERD (gastroesophageal reflux disease)    History of chest pain    History of knee replacement, total    Hypertension    Hypothyroidism    N&V (nausea and vomiting)    Peripheral neuropathy    Right foot no sensation   Sciatica    Sleep apnea    does not use CPAP   Thyroid disease    Wears glasses     Assessment: 49 YOF with a PMH of CAD and  recent GI hemorrhage not on [facility] PTA who is admitted with left MCA stroke with concern for right hemisphere involvement and central embolic stroke. Patient now found to have left atrial mass with PFO and mass extending into the right atrium. CT positive for acute PE with CT evidence of right heart strain (RV/LV Ratio = 1.54). LE doppler + RLE age-indeterminate DVT.  Pharmacy consulted to dose heparin.  No bolus per MD and will have lower therapeutic goal due to CVA.   10/11: confirmatory Heparin level therapeutic this AM at 0.40 with heparin running at 1100 units/hour. No issues with bleeding or infusion noted. Slight Hgb drift down today.   Goal of Therapy:  Heparin level 0.3-0.5 units/ml Monitor platelets by anticoagulation protocol: Yes   Plan:  Continue heparin infusion at 1100 units/hr Check heparin level daily while on heparin Continue to monitor H&H and platelets  Thank you for allowing pharmacy to be a part of this patient's care.  Jani Gravel, PharmD Clinical Pharmacist  03/25/2023 6:35 AM

## 2023-03-25 NOTE — Progress Notes (Signed)
Rounding Note    Patient Name: Caroline Williams Date of Encounter: 03/25/2023  [facility] Cardiologist: Reatha Harps, MD   Subjective   Complains of R leg pain. Only able to answer with nod or one word response "forgetness"   Inpatient Medications    Scheduled Meds:  brexpiprazole  2 mg Oral Daily   cilostazol  50 mg Oral BID   dapagliflozin propanediol  5 mg Oral Daily   doxepin  75 mg Oral QHS   fesoterodine  4 mg Oral Daily   levothyroxine  150 mcg Oral Q0600   metoprolol succinate  25 mg Oral Daily   morphine  15 mg Oral Q12H   pantoprazole  40 mg Oral Daily   rOPINIRole  2 mg Oral QHS   rosuvastatin  40 mg Oral Daily   sodium chloride flush  10 mL Intravenous Q12H   sodium chloride flush  3 mL Intravenous Once   umeclidinium-vilanterol  1 puff Inhalation Daily   vortioxetine HBr  20 mg Oral Daily   Continuous Infusions:  heparin 1,100 Units/hr (03/24/23 1946)   PRN Meds: acetaminophen **OR** acetaminophen, albuterol, ALPRAZolam, ondansetron **OR** ondansetron (ZOFRAN) IV, oxyCODONE   Vital Signs    Vitals:   03/24/23 2315 03/25/23 0324 03/25/23 0757 03/25/23 0837  BP: 101/72 122/80    Pulse: 95 92 71 89  Resp: 18 18 18 18   Temp: 98.6 F (37 C) 98.8 F (37.1 C) 98.4 F (36.9 C)   TempSrc: Axillary Oral Oral   SpO2: 98% 95% 98% 97%  Weight:      Height:       No intake or output data in the 24 hours ending 03/25/23 0904    03/22/2023    3:00 PM 02/05/2023    8:37 AM 01/31/2023    9:38 AM  Last 3 Weights  Weight (lbs) 215 lb 215 lb 227 lb 4.7 oz  Weight (kg) 97.523 kg 97.523 kg 103.1 kg      Telemetry    NSR with one burst of tachycardic episode - Personally Reviewed  ECG    NSR without significant ST-T wave changes - Personally Reviewed  Physical Exam   GEN: No acute distress. Alert, respond with one word to all questions, trouble formulating word, however appears to understand the questions   Neck: No JVD Cardiac: RRR, no  murmurs, rubs, or gallops.  Respiratory: Clear to auscultation bilaterally. GI: Soft, nontender, non-distended  MS: No edema; No deformity. Neuro:  unable to assess  Psych: Normal affect   Labs    High Sensitivity Troponin:   Recent Labs  Lab 03/22/23 1729 03/22/23 1817  TROPONINIHS 1,100* 1,027*     Chemistry Recent Labs  Lab 03/21/23 1746 03/21/23 1750 03/22/23 1211 03/23/23 0517  NA 138 140 139 139  K 4.1 4.1 3.0* 4.2  CL 104 107 106 106  CO2 21*  --  22 17*  GLUCOSE 112* 112* 117* 84  BUN 19 20 17 14   CREATININE 1.68* 1.60* 1.56* 1.53*  CALCIUM 9.1  --  8.3* 8.6*  MG  --   --  1.9 2.0  PROT 7.5  --  6.6  --   ALBUMIN 3.1*  --  2.7*  --   AST 25  --  23  --   ALT 14  --  10  --   ALKPHOS 153*  --  137*  --   BILITOT 1.1  --  0.8  --   [facility]  34*  --  37* 38*  ANIONGAP 13  --  11 16*    Lipids  Recent Labs  Lab 03/22/23 1211  CHOL 204*  TRIG 79  HDL 67  LDLCALC 121*  CHOLHDL 3.0    Hematology Recent Labs  Lab 03/23/23 0517 03/24/23 0711 03/25/23 0717  WBC 10.3 7.3 6.5  RBC 3.91 3.62* 3.30*  HGB 10.2* 9.4* 8.7*  HCT 33.8* 30.4* 27.9*  MCV 86.4 84.0 84.5  MCH 26.1 26.0 26.4  MCHC 30.2 30.9 31.2  RDW 16.9* 16.4* 16.7*  PLT 326 296 297   Thyroid  Recent Labs  Lab 03/22/23 1211  TSH 19.119*    BNP Recent Labs  Lab 03/22/23 1729  BNP 1,241.6*    DDimer No results for input(s): "DDIMER" in the last 168 hours.   Radiology    VAS [place] LOWER EXTREMITY VENOUS (DVT)  Result Date: 03/23/2023  Lower Venous DVT Study Patient Name:  Caroline Williams  Date of Exam:   03/23/2023 Medical Rec #: [id number]        Accession #:    [id number] Date of Birth: 22-Jun-1960        Patient Gender: F Patient Age:   62 years Exam Location:  [facility] Procedure:      VAS [place] LOWER EXTREMITY VENOUS (DVT) Referring Phys: Gerri Spore O'NEAL --------------------------------------------------------------------------------  Indications: Pulmonary embolism.  Risk  Factors: None identified. Limitations: Poor ultrasound/tissue interface. Comparison Study: No prior study Performing Technologist: Shona Simpson  Examination Guidelines: A complete evaluation includes B-mode imaging, spectral Doppler, color Doppler, and power Doppler as needed of all accessible portions of each vessel. Bilateral testing is considered an integral part of a complete examination. Limited examinations for reoccurring indications may be performed as noted. The reflux portion of the exam is performed with the patient in reverse Trendelenburg.  +---------+---------------+---------+-----------+----------+-----------------+ RIGHT    CompressibilityPhasicitySpontaneityPropertiesThrombus Aging    +---------+---------------+---------+-----------+----------+-----------------+ CFV      Full           Yes      Yes                                    +---------+---------------+---------+-----------+----------+-----------------+ SFJ      Full                                                           +---------+---------------+---------+-----------+----------+-----------------+ FV Prox  Partial                 Yes                  Age Indeterminate +---------+---------------+---------+-----------+----------+-----------------+ FV Mid   Partial                 Yes                  Age Indeterminate +---------+---------------+---------+-----------+----------+-----------------+ FV DistalPartial                 Yes                  Age Indeterminate +---------+---------------+---------+-----------+----------+-----------------+ PFV      Full                                                           +---------+---------------+---------+-----------+----------+-----------------+  POP      Full           Yes      Yes                                    +---------+---------------+---------+-----------+----------+-----------------+ PTV      None                    Yes                   Age Indeterminate +---------+---------------+---------+-----------+----------+-----------------+ PERO     None                    Yes                  Chronic           +---------+---------------+---------+-----------+----------+-----------------+   +---------+---------------+---------+-----------+----------+--------------+ LEFT     CompressibilityPhasicitySpontaneityPropertiesThrombus Aging +---------+---------------+---------+-----------+----------+--------------+ CFV      Full           Yes      Yes                                 +---------+---------------+---------+-----------+----------+--------------+ SFJ      Full                                                        +---------+---------------+---------+-----------+----------+--------------+ FV Prox  Full                                                        +---------+---------------+---------+-----------+----------+--------------+ FV Mid   Full                                                        +---------+---------------+---------+-----------+----------+--------------+ FV DistalFull                                                        +---------+---------------+---------+-----------+----------+--------------+ PFV      Full                                                        +---------+---------------+---------+-----------+----------+--------------+ POP      Full           Yes      Yes                                 +---------+---------------+---------+-----------+----------+--------------+ PTV      Full                                                        +---------+---------------+---------+-----------+----------+--------------+  PERO     Full                                                        +---------+---------------+---------+-----------+----------+--------------+ Superficial femoral vein seen with intermittent partial thrombi of indeterminate  age. PTVs and PeroVs each have 1/2 veins obstructively thrombosed for the full length of the calf.    Summary: RIGHT: - Findings consistent with age indeterminate deep vein thrombosis involving the right femoral vein, right posterior tibial veins, and right peroneal veins.  - No cystic structure found in the popliteal fossa.  LEFT: - There is no evidence of deep vein thrombosis in the lower extremity.  - No cystic structure found in the popliteal fossa.  *See table(s) above for measurements and observations. Electronically signed by Coral Else MD on 03/23/2023 at 9:05:25 PM.    Final     Cardiac Studies   CTA 03/22/2023 IMPRESSION: 1. Positive for acute PE with CT evidence of right heart strain (RV/LV Ratio = 1.54) consistent with at least submassive (intermediate risk) PE. The presence of right heart strain has been associated with an increased risk of morbidity and mortality. Please refer to the "Code PE Focused" order set in EPIC. 2. Probable clot in transit across the interatrial septum from right to left atrium, significant systemic embolic risk. Critical Value/emergent results were sent by secure chat at the time of interpretation on 03/22/2023 at 8:44 pm to provider Velna Ochs, who acknowledged these results. 3. Small left pleural effusion. 4. Peripheral somewhat geographic ground-glass opacities in both apices, and in the anterior and lateral left upper lobe. 5. Aortic Atherosclerosis (ICD10-I70.0) and Emphysema (ICD10-J43.9).    Echo 03/22/2023  1. There is a mobile clot/thrombus in transit sitting in the interatrial  septum.   2. Left ventricular ejection fraction, by estimation, is 40 to 45%. The  left ventricle has mildly decreased function. The left ventricle  demonstrates global hypokinesis. Left ventricular diastolic parameters are  indeterminate.   3. Right ventricular systolic function is normal. The right ventricular  size is normal.   4. Thrombus noted in the left  atrium.   5. The mitral valve is normal in structure. No evidence of mitral valve  regurgitation. No evidence of mitral stenosis.   6. The aortic valve is normal in structure. Aortic valve regurgitation is  not visualized. No aortic stenosis is present.   7. There is borderline dilatation of the ascending aorta, measuring 36  mm.   8. The inferior vena cava is normal in size with greater than 50%  respiratory variability, suggesting right atrial pressure of 3 mmHg.   9. Thrombus seen in the right atrium.    Patient Profile     62 y.o. female with PMH of nonobstructive CAD, HTN, HLD, DM II, COPD, recent GI hemarrhage secondary to ischemic colitis who was admitted on 10/8 for acute CVA. Cardiology service controlled for R atrial and L atrial mass. Stroke was felt to be due to PE that crossed into L atrium  Assessment & Plan    Acute CVA - MRI of brain confirms patchy acute L MCA distribution infarction involving L frontal, parietal and temporal lobs. Small infarcts involving contralateral R frontal centrum semi ovale and L greater than R cerebeluum  - secondary to PE that crossed into left atrium. Evaluated by  PCCM, felt not to be a candidate for lytic due to recent GI hemorrhage  - not a good candidate for surgery. Recommendation is pursue medical management  - On IV heparin, plan to transition to NOAC when able  Acute PE/DVT: - CTA of chest 10/8 showed PE, also showed clot in transit across interatrial septum from R atrium into L atrium. CLot in transit confirmed on echo - venous doppler 10/9 showed age indeterminate DVT in the R femoral vein, R posterior tibial veins and R peroneal veins   Atrial tachycardia: started on metoprolol succinate 25mg  daily.   Acute systolic heart failure/ LV dysfunction with EF 40-45%: started on metoprolol succinate. On farxiga. SBP 100-120s, hesitant to add ARB today to avoid dropping BP too low in the setting of PE w/ cor pulmonale and stroke.   H/o GI  hemorrhage: admitted in Aug 2024 with GI bleed. Hgb 8.7 this morning, need to monitor with repeat CBC  Nonobstructive CAD: seen on previous coronary CT 10/05/2019. Negative myoview in 12/2020  Elevated troponin: demand ischemia due to RV strain       For questions or updates, please contact [facility] Please consult [email or web address] for contact info under        Signed, Azalee Course, PA  03/25/2023, 9:04 AM

## 2023-03-25 NOTE — Consult Note (Signed)
Consultation Note Date: 03/25/2023   Patient Name: Caroline Williams  DOB: 19-Mar-1961  MRN: [id number]  Age / Sex: 62 y.o., female  PCP: Hoy Register, MD Referring Physician: Arnetha Courser, MD  Reason for Consultation: Establishing goals of care  HPI/Patient Profile: 62 y.o. female  with past medical history of anxiety, bipolar 1 disorder, coronary artery disease, COPD, depression, diabetes mellitus type 2, history of Roux-en-Y, GERD, hypothyroidism, hypertension, neuropathy and more admitted on 03/21/2023 with aphasia. Patient diagnosed with acute CVA. Patient was also found to have submassive bilateral PE, concern of cor pulmonale with right ventricular strain, a large and transient clot between atrium. Pt with history of hospitalization for significant GI bleed recently. PMT consulted to discuss GOC.   Clinical Assessment and Goals of Care: I have reviewed medical records including EPIC notes, labs and imaging, received report from RN, assessed the patient and then met with patient  to discuss diagnosis prognosis, GOC, EOL wishes, disposition and options.  Communication with patient still difficult d/t aphasia but we are able to communicate with yes/no questions.   I introduced Palliative Medicine as specialized medical care for people living with serious illness. It focuses on providing relief from the symptoms and stress of a serious illness. The goal is to improve quality of life for both the patient and the family.  She is initially concerned when I share that I will reach out to her family for more discussion but we discussed the reasons for this - to all be on the same page about her wishes and all share an understanding of what is going on with her care. She agrees to family meeting. Her niece is at bedside with her helping her understand. We also discuss that her communication is impaired and her family can help share her previously stated  wishes and help me discern wishes while we discuss medical issues. She agrees.   I ask her if she has questions or concerns for me now and she shakes her head no. She is interested in having neurology/cardiology at family meeting.   Call to patient's daughter Maryln Gottron to arrange family meeting - she tells me patient's son will be in town tomorrow and asks if we can meet then. She will call me back later to arrange time for meeting tomorrow.   Discussed about with patient and she agrees to family meeting tomorrow.   Discussed with patient if she were unable to make her own decisions her next of kin would serve as decision makers - she confirms next of kin is son and daughter Riki Rusk and [place]). She understands this agrees.   Discussed with patient and daughter the importance of continued conversation with family and the medical providers regarding overall plan of care and treatment options, ensuring decisions are within the context of the patient's values and GOCs.    Questions and concerns were addressed. The family was encouraged to call with questions or concerns.    Primary Decision Maker PATIENT - as able, communication somewhat limited, supportive decision making with next of kin (son and daughter)    SUMMARY OF RECOMMENDATIONS   - palliative introduced - patient would want her next of kin (son and daughter) to make decisions for her if she were unable - arranging family meeting for tomorrow - awaiting callback from daughter for time   Code Status/Advance Care Planning: Full code     Primary Diagnoses: Present on Admission:  Acute CVA (cerebrovascular accident) (HCC)  Hypothyroidism  Essential (primary) hypertension  Hyperlipidemia  COPD mixed type (HCC)  AKI (acute kidney injury) (HCC)  Diabetes mellitus with neuropathy (HCC)  Chronic low back pain   I have reviewed the medical record, interviewed the patient and family, and examined the patient. The following aspects are  pertinent.  Past Medical History:  Diagnosis Date   Anxiety    Arthritis    Asthma    Bipolar 1 disorder (HCC)    Breast discharge    Breast lump    Breast pain    CAD (coronary artery disease) 12/03/2022    o Cath 2008: no CAD   o CCTA 10/05/19: CAC score 49, 88th percentile, nonobstructive CAD (LM 1-24 mid and dist)  o Myoview 12/31/20: EF 45, no ischemia or infarction, low risk    Chronic pain    on MS Contin and oxycodone   COPD (chronic obstructive pulmonary disease) (HCC)    Depression    Diabetes mellitus    diet and exercise controlled   Dysrhythmia    HR in 30s on metoprolol   Escherichia coli (E. coli) infection    Fever    GERD (gastroesophageal reflux disease)    History of chest pain    History of knee replacement, total    Hypertension    Hypothyroidism    N&V (nausea and vomiting)    Peripheral neuropathy    Right foot no sensation   Sciatica    Sleep apnea    does not use CPAP   Thyroid disease    Wears glasses    Social History   Socioeconomic History   Marital status: Single    Spouse name: Not on file   Number of children: Not on file   Years of education: Not on file   Highest education level: Not on file  Occupational History   Not on file  Tobacco Use   Smoking status: Former    Current packs/day: 0.25    Average packs/day: 0.3 packs/day for 44.2 years (11.1 ttl pk-yrs)    Types: Cigarettes    Start date: 5    Quit date: 2020   Smokeless tobacco: Never  Vaping Use   Vaping status: Never Used  Substance and Sexual Activity   Alcohol use: Not Currently    Comment: social   Drug use: No   Sexual activity: Yes    Birth control/protection: Surgical  Other Topics Concern   Not on file  Social History Narrative   Not on file   Social Determinants of Health   Financial Resource Strain: Medium Risk (11/03/2022)   Overall Financial Resource Strain (CARDIA)    Difficulty of Paying Living Expenses: Somewhat hard  Food Insecurity: No  Food Insecurity (03/22/2023)   Hunger Vital Sign    Worried About Running Out of Food in the Last Year: Never true    Ran Out of Food in the Last Year: Never true  Transportation Needs: Unmet Transportation Needs (03/22/2023)   PRAPARE - Transportation    Lack of Transportation (Medical): Yes    Lack of Transportation (Non-Medical): Yes  Physical Activity: Inactive (11/03/2022)   Exercise Vital Sign    Days of Exercise per Week: 0 days    Minutes of Exercise per Session: 0 min  Stress: Stress Concern Present (11/03/2022)   [employer] of Occupational Health - Occupational Stress Questionnaire    Feeling of Stress : To some extent  Social Connections: Moderately Integrated (11/03/2022)   Social Connection and Isolation Panel [NHANES]    Frequency  of Communication with Friends and Family: More than three times a week    Frequency of Social Gatherings with Friends and Family: Once a week    Attends Religious Services: 1 to 4 times per year    Active Member of [employer] or Organizations: No    Attends Engineer, structural: Never    Marital Status: Living with partner   Family History  Problem Relation Age of Onset   Cancer Father        colon   Stroke Father    Heart disease Father    Diabetes Father    Hypertension Father    Depression Sister    Hypertension Mother    Breast cancer Neg Hx    Scheduled Meds:  brexpiprazole  2 mg Oral Daily   cilostazol  50 mg Oral BID   dapagliflozin propanediol  5 mg Oral Daily   doxepin  75 mg Oral QHS   fesoterodine  4 mg Oral Daily   levothyroxine  150 mcg Oral Q0600   metoprolol succinate  25 mg Oral Daily   morphine  15 mg Oral Q12H   pantoprazole  40 mg Oral Daily   rOPINIRole  2 mg Oral QHS   rosuvastatin  40 mg Oral Daily   sodium chloride flush  10 mL Intravenous Q12H   sodium chloride flush  3 mL Intravenous Once   umeclidinium-vilanterol  1 puff Inhalation Daily   vortioxetine HBr  20 mg Oral Daily   Continuous  Infusions:  heparin 1,100 Units/hr (03/24/23 1946)   PRN Meds:.acetaminophen **OR** acetaminophen, albuterol, ALPRAZolam, ondansetron **OR** ondansetron (ZOFRAN) IV, oxyCODONE Allergies  Allergen Reactions   Asa [Aspirin] Anaphylaxis, Shortness Of Breath and Other (See Comments)    Respiratory distress  Pt states she has had Toradol several times without any reactions   Bee Venom Anaphylaxis   Sulfa Antibiotics Anaphylaxis   Nsaids Other (See Comments)    History of gastric bypass   Other Other (See Comments)    No blood products.  Patient did  Request that only albumin or albumin-containing products may be administered   Repatha [Evolocumab] Other (See Comments)    Unknown reaction   Review of Systems  Constitutional:  Positive for activity change.    Physical Exam Constitutional:      General: She is not in acute distress. Pulmonary:     Effort: Pulmonary effort is normal.  Skin:    General: Skin is warm and dry.  Neurological:     Mental Status: She is alert.     Comments: Expressive aphasia     Vital Signs: BP 122/80 (BP Location: Right Arm)   Pulse 89   Temp 98.4 F (36.9 C) (Oral)   Resp 18   Ht 5\' 5"  (1.651 m)   Wt 97.5 kg Comment: weight from 8/24  SpO2 97%   BMI 35.78 kg/m  Pain Scale: 0-10   Pain Score: 8    SpO2: SpO2: 97 % O2 Device:SpO2: 97 % O2 Flow Rate: .   IO: Intake/output summary: No intake or output data in the 24 hours ending 03/25/23 1215  LBM: Last BM Date : 03/24/23 Baseline Weight: Weight: 97.5 kg (weight from 8/24) Most recent weight: Weight: 97.5 kg (weight from 8/24)     Palliative Assessment/Data: PPS 40%     *Please note that this is a verbal dictation therefore any spelling or grammatical errors are due to the "Dragon [employer] One" system interpretation.   Time Total: 65  minutes Time spent includes: Detailed review of medical records (labs, imaging, vital signs), medically appropriate exam, discussion with treatment  team, counseling and educating patient, family and/or staff, documenting clinical information, medication management and coordination of care.    Gerlean Ren, DNP, AGNP-C Palliative Medicine Team [phone] Pager: [phone]

## 2023-03-25 NOTE — Telephone Encounter (Signed)
glucose blood (ACCU-CHEK GUIDE) test strip [[id number]]   Lancets (ACCU-CHEK MULTICLIX) lancets [[id number]]  Not on current list list, routing for review.

## 2023-03-25 NOTE — Plan of Care (Signed)
  Problem: Activity: Goal: Will remain free from falls Outcome: Progressing   Problem: Pain Management: Goal: Pain level will decrease Outcome: Progressing   Problem: Skin Integrity: Goal: Will show signs of wound healing Outcome: Progressing   Problem: Health Behavior/Discharge Planning: Goal: Identification of resources available to assist in meeting health care needs will improve Outcome: Progressing   Problem: Ischemic Stroke/TIA Tissue Perfusion: Goal: Complications of ischemic stroke/TIA will be minimized Outcome: Progressing   Problem: Coping: Goal: Will verbalize positive feelings about self Outcome: Progressing Goal: Will identify appropriate support needs Outcome: Progressing

## 2023-03-25 NOTE — Care Management Important Message (Signed)
Important Message  Patient Details  Name: Caroline Williams MRN: [id number] Date of Birth: [birth year]   Important Message Given:  Yes - Medicare IM     Dorena Bodo 03/25/2023, 2:52 PM

## 2023-03-25 NOTE — Plan of Care (Signed)
Will continue to monitor.

## 2023-03-26 DIAGNOSIS — Z515 Encounter for palliative care: Secondary | ICD-10-CM | POA: Diagnosis not present

## 2023-03-26 DIAGNOSIS — Z7189 Other specified counseling: Secondary | ICD-10-CM | POA: Diagnosis not present

## 2023-03-26 DIAGNOSIS — I639 Cerebral infarction, unspecified: Secondary | ICD-10-CM | POA: Diagnosis not present

## 2023-03-26 DIAGNOSIS — I2489 Other forms of acute ischemic heart disease: Secondary | ICD-10-CM | POA: Diagnosis not present

## 2023-03-26 DIAGNOSIS — I2609 Other pulmonary embolism with acute cor pulmonale: Secondary | ICD-10-CM | POA: Diagnosis not present

## 2023-03-26 LAB — CBC
HCT: 27.4 % — ABNORMAL LOW (ref 36.0–46.0)
Hemoglobin: 8.5 g/dL — ABNORMAL LOW (ref 12.0–15.0)
MCH: 26.7 pg (ref 26.0–34.0)
MCHC: 31 g/dL (ref 30.0–36.0)
MCV: 86.2 fL (ref 80.0–100.0)
Platelets: 292 10*3/uL (ref 150–400)
RBC: 3.18 MIL/uL — ABNORMAL LOW (ref 3.87–5.11)
RDW: 16.3 % — ABNORMAL HIGH (ref 11.5–15.5)
WBC: 6.2 10*3/uL (ref 4.0–10.5)
nRBC: 0 % (ref 0.0–0.2)

## 2023-03-26 LAB — HEPARIN LEVEL (UNFRACTIONATED): Heparin Unfractionated: 0.39 [IU]/mL (ref 0.30–0.70)

## 2023-03-26 LAB — GLUCOSE, CAPILLARY
Glucose-Capillary: 102 mg/dL — ABNORMAL HIGH (ref 70–99)
Glucose-Capillary: 107 mg/dL — ABNORMAL HIGH (ref 70–99)

## 2023-03-26 LAB — BASIC METABOLIC PANEL
Anion gap: 9 (ref 5–15)
BUN: 14 mg/dL (ref 8–23)
CO2: 21 mmol/L — ABNORMAL LOW (ref 22–32)
Calcium: 8.4 mg/dL — ABNORMAL LOW (ref 8.9–10.3)
Chloride: 105 mmol/L (ref 98–111)
Creatinine, Ser: 1.5 mg/dL — ABNORMAL HIGH (ref 0.44–1.00)
GFR, Estimated: 39 mL/min — ABNORMAL LOW (ref >60)
Glucose, Bld: 87 mg/dL (ref 70–99)
Potassium: 3.9 mmol/L (ref 3.5–5.1)
Sodium: 135 mmol/L (ref 135–145)

## 2023-03-26 LAB — CARDIOLIPIN ANTIBODIES, IGG, IGM, IGA
Anticardiolipin IgA: <9 [APL'U]/mL (ref 0–11)
Anticardiolipin IgG: 16 [GPL'U]/mL — ABNORMAL HIGH (ref 0–14)
Anticardiolipin IgM: <9 [MPL'U]/mL (ref 0–12)

## 2023-03-26 MED ORDER — RIVAROXABAN 20 MG PO TABS: 20.0000 mg | ORAL_TABLET | Freq: Every day | ORAL | Status: DC

## 2023-03-26 MED ORDER — RIVAROXABAN 15 MG PO TABS
15.0000 mg | ORAL_TABLET | Freq: Two times a day (BID) | ORAL | Status: DC
  Administered 2023-03-26 – 2023-03-29 (×7): 15 mg via ORAL
  Filled 2023-03-26 (×7): qty 1

## 2023-03-26 NOTE — Progress Notes (Signed)
Progress Note   Patient: Caroline Williams GEX:[id number] DOB: 1960-10-03 DOA: 03/21/2023     4 DOS: the patient was seen and examined on 03/26/2023   Brief hospital course: QUINETTE CHRISTOFFER is a 62 y.o. female with medical history significant of anxiety, bipolar 1 disorder, coronary artery disease, COPD, depression, diabetes mellitus type 2, history of Roux-en-Y, GERD, hypothyroidism, hypertension, neuropathy, presented to the hospital with aphasia.  At baseline patient does not drive or cook and is able to manage her own medication.  Patient was recently admitted hospital for melena and a hemoglobin of 3.0 and was noted to have ischemic colitis.  At that time, general surgery was consulted and patient was treated conservatively with bowel rest, liquid diet, IV Zosyn.  Apparently family does not think she is returned to her baseline since then.  On this presentation, patient was unable to express and had some pain in the right foot.  Denies any GI bleed.  Physical exam revealed patient was afebrile tachycardic with elevated blood pressure.   Imaging pertinent for left MCA stroke with concern for right hemisphere involvement as well and multiple vascular territories concerning for possible central embolic stroke.MRA/MRI brain shows patchy acute left MCA distribution infarct involving left frontal, parietal, temporal lobes without significant mass effect or hemorrhage.  A1c of 5.3 and lipid panel with LDL of 139.  CTA was done and found to have bilateral submassive PE with CT evidence of right heart strain.  Had intermediate Pesi score.  She has an extensive clot in transit in the right atrium extending into the left atrium across the PFO. This is the etiology of her stroke.  Elevated troponin and BNP, likely secondary to  or RV dysfunction.  EF of 40 to 45% which is not very far from her baseline. No aggressive diuresis recommended at this time due to severe RV dysfunction and submassive PE.  Cardiology is  on board.  PCCM and IR was also consulted but unfortunately she is not a candidate for any intervention.She is at high risk for further embolic phenomenon. Catheter directed thrombolysis or thrombectomy with IR would be high risk given the clot in transit putting her at risk for further embolic disease.  PCCM did consider half dose systemic thrombolysis but with her recent GI bleed that will be contraindicated.  Currently very limited choices, after lengthy discussion between cardiology, neurology, PCCM and IR it was decided to just continue with heparin infusion for now.  Patient will likely need lifelong anticoagulation.  Very high risk for deterioration.  Palliative care was also consulted to discuss goals of care.  10/9: Patient currently awake, appears to be disoriented and confused, talking but not answering questions appropriately.  On room air and no significant respiratory distress.  10/10: Vitals mostly stable and remained on room air.  Lower extremity venous Doppler positive for age-indeterminate right femoral, tibial and popliteal vein DVT.  Discussed with neurology and patient likely will only take anticoagulation, no antiplatelet to decrease the risk of bleeding. Hypercoagulable panel ordered. Patient had a transient SVT this morning, remained asymptomatic.  Cardiology also added metoprolol.  10/11: Hemodynamically stable.  No new deficit.  Remained on heparin infusion which can be switched to NOAC when our neurology is okay with that.  Pending palliative care meeting PT is recommending SNF  10/12: Vital stable.  Transitioning heparin to Xarelto.  Assessment and Plan: * Acute CVA (cerebrovascular accident) (HCC) - Evaluated by neurology and found to have a left MCA stroke with  concern for right hemisphere involvement as well and multiple vascular territories concerning for possible central embolic stroke Patient was also found to have submassive bilateral PE, concern of cor  pulmonale with right ventricular strain, a large and transient clot between atrium. She was started on heparin infusion. Neurology to decide about antiplatelets Continue with Crestor PT and OT are recommending SNF  Pulmonary embolism with acute cor pulmonale (HCC) CTA positive for bilateral submassive PE with right ventricular strain.  Echocardiogram with concern of clot in transit.  Lengthy interdisciplinary conversation between cardiology, PCCM, neurology and IR and unfortunately patient is not a candidate for any surgical intervention or thrombolytic therapy. Will be very high risk for progression.  Currently stable on room air Lower extremity venous Doppler positive for right lower extremity age-indeterminate significant DVT. -Switch heparin with Xarelto -Closely monitor  Paroxysmal SVT (supraventricular tachycardia) (HCC) Patient had 1 transient episode of SVT this morning, remained asymptomatic. Cardiology added metoprolol -Continue to monitor  Acute clinical systolic heart failure (HCC) Echocardiogram with EF of 40 to 45%, which is slightly below than her baseline of 50% Also concern of right ventricular strain with extensive PE and clot in transit. Not a candidate for aggressive diuresis although troponin and BNP were elevated due to acute right ventricular strain and massive PE. -Cardiology is on board  History of GI bleed History of recent GI bleed and ischemic colitis. Hemoglobin currently stable at 10.2 -Monitor hemoglobin -Transfuse if below 7  GERD (gastroesophageal reflux disease) - With history of melena/GI hemorrhage - Continue Protonix - Closely monitor hemoglobin, at the end of August it was as low as 3.0 - Continue to monitor  Diabetes mellitus with neuropathy (HCC) Well-controlled with A1c of 5.3 - Continue to monitor CBG - If needed add sliding scale coverage  Essential (primary) hypertension - Holding olmesartan, hydrochlorothiazide, Aldactone, Imdur  for permissive hypertension  Chronic low back pain History of L4-L5 decompression and fusion. -Continue home MS Contin and oxycodone  Hypothyroidism - TSH markedly elevated last time it was checked approximately 6 weeks ago, Improved but still elevated. - Continue Synthroid-need to have a close follow-up with PCP for further dose adjustment  COPD mixed type (HCC) - Continue Anoro and albuterol  Hyperlipidemia - Lipid panel pending   Psychiatric disorder - Continue Rexulti, Trintellix, Xanax and doxepin  AKI (acute kidney injury) (HCC) With history of CKD stage IIIb - Creatinine at baseline 1.2, creatinine on admission was 1.68, slowly improving - Holding nephrotoxic agents when possible - Monitor renal function   Subjective: Patient continued to have word finding difficulty and dysarthria.  No new deficit.  She was little somnolent stating that she did not get a good night sleep.  Physical Exam: Vitals:   03/26/23 0426 03/26/23 0906 03/26/23 1212 03/26/23 1642  BP: 117/76 111/76 (!) 140/95 126/77  Pulse: 88 79 93 83  Resp: 18 16 17 17   Temp: (!) 97.5 F (36.4 C) 98.6 F (37 C) 98.6 F (37 C) 98.6 F (37 C)  TempSrc:  Axillary Oral Oral  SpO2: 99% 100% 97% 97%  Weight:      Height:       General.frail lady, in no acute distress. Pulmonary.  Lungs clear bilaterally, normal respiratory effort. CV.  Regular rate and rhythm, no JVD, rub or murmur. Abdomen.  Soft, nontender, nondistended, BS positive. CNS.  Little somnolent, no new deficit Extremities.  No edema, no cyanosis, pulses intact and symmetrical. Psychiatry.  Judgment and insight appears normal.  Data Reviewed: Prior data reviewed  Family Communication:   Disposition: Status is: Inpatient Remains inpatient appropriate because: Severity of illness  Planned Discharge Destination: Skilled nursing facility  DVT prophylaxis.  Heparin infusion Time spent: 44 minutes  This record has been created  using Conservation officer, historic buildings. Errors have been sought and corrected,but may not always be located. Such creation errors do not reflect on the standard of care.   Author: Arnetha Courser, MD 03/26/2023 5:39 PM  For on call review [email or web address].

## 2023-03-26 NOTE — Progress Notes (Signed)
Daily Progress Note   Patient Name: Caroline Williams       Date: 03/26/2023 DOB: 02-25-1961  Age: 62 y.o. MRN#: [id number] Attending Physician: Arnetha Courser, MD Primary Care Physician: Hoy Register, MD Admit Date: 03/21/2023  Reason for Consultation/Follow-up: Establishing goals of care  Subjective: C/o R lower leg pain  Length of Stay: 4  Current Medications: Scheduled Meds:   brexpiprazole  2 mg Oral Daily   cilostazol  50 mg Oral BID   dapagliflozin propanediol  5 mg Oral Daily   doxepin  75 mg Oral QHS   fesoterodine  4 mg Oral Daily   levothyroxine  150 mcg Oral Q0600   metoprolol succinate  25 mg Oral Daily   morphine  15 mg Oral Q12H   pantoprazole  40 mg Oral Daily   Rivaroxaban  15 mg Oral BID WC   Followed by   Melene Muller ON 04/16/2023] rivaroxaban  20 mg Oral Q supper   rOPINIRole  2 mg Oral QHS   rosuvastatin  40 mg Oral Daily   sodium chloride flush  10 mL Intravenous Q12H   sodium chloride flush  3 mL Intravenous Once   umeclidinium-vilanterol  1 puff Inhalation Daily   vortioxetine HBr  20 mg Oral Daily    Continuous Infusions:   PRN Meds: acetaminophen **OR** acetaminophen, albuterol, ALPRAZolam, ondansetron **OR** ondansetron (ZOFRAN) IV, mouth rinse, oxyCODONE  Physical Exam Constitutional:      General: She is not in acute distress.    Appearance: She is ill-appearing.  Pulmonary:     Effort: Pulmonary effort is normal.  Skin:    General: Skin is warm and dry.  Neurological:     Comments: Expressive aphasia             Vital Signs: BP (!) 140/95 (BP Location: Right Arm)   Pulse 93   Temp 98.6 F (37 C) (Oral)   Resp 17   Ht 5\' 5"  (1.651 m)   Wt 97.5 kg Comment: weight from 8/24  SpO2 97%   BMI 35.78 kg/m  SpO2: SpO2: 97 % O2 Device: O2 Device:  Room Air O2 Flow Rate:    Intake/output summary: No intake or output data in the 24 hours ending 03/26/23 1458 LBM: Last BM Date : 03/24/23 Baseline Weight: Weight: 97.5 kg (weight from 8/24) Most recent weight: Weight: 97.5 kg (weight from 8/24)       Palliative Assessment/Data: PPS 40%      Patient Active Problem List   Diagnosis Date Noted   Paroxysmal SVT (supraventricular tachycardia) (HCC) 03/24/2023   Pulmonary embolism with acute cor pulmonale (HCC) 03/23/2023   Acute clinical systolic heart failure (HCC) 03/23/2023   History of GI bleed 03/23/2023   GERD (gastroesophageal reflux disease) 03/22/2023   Psychiatric disorder 03/22/2023   Acute CVA (cerebrovascular accident) (HCC) 03/21/2023   Acute ischemic colitis (HCC) 02/05/2023   Acute GI bleeding 02/01/2023   UTI (urinary tract infection) 01/31/2023   CKD (chronic kidney disease) stage 3, GFR 30-59 ml/min (HCC) 01/31/2023   AKI (acute kidney injury) (HCC) 01/31/2023   Acute metabolic encephalopathy 01/31/2023   ABLA (acute blood loss anemia) 01/31/2023   Gastrointestinal hemorrhage with melena 01/31/2023  Hypokalemia 01/31/2023   Spondylolisthesis, lumbar region 01/12/2023   PVC's (premature ventricular contractions) 12/03/2022   CAD (coronary artery disease) 12/03/2022   Preoperative cardiovascular examination 12/02/2022   Bipolar 1 disorder (HCC)    Degeneration of lumbar intervertebral disc 08/07/2020   Essential (primary) hypertension 05/07/2020   Other chronic pain 02/14/2020   Elevated blood-pressure reading, without diagnosis of hypertension 02/13/2020   Hyperlipidemia 09/17/2019   COPD mixed type (HCC) 04/17/2019   Tobacco user 04/17/2019   Nocturnal hypoxemia 12/25/2018   Muscle weakness 12/05/2018   Change in bowel habits 03/14/2018   Exertional chest pain 01/26/2016   Hallucination, drug-induced (HCC) 03/12/2015   Hypothyroidism 03/12/2015   Chronic low back pain 01/29/2015   Lumbar  spondylosis 01/29/2015   History of Roux-en-Y gastric bypass 2016 11/25/2014   Diabetes mellitus with neuropathy (HCC) 06/20/2013   Chronic pain of right knee 06/20/2013   Morbid obesity (HCC) 07/05/2012    Palliative Care Assessment & Plan   HPI: 62 y.o. female  with past medical history of anxiety, bipolar 1 disorder, coronary artery disease, COPD, depression, diabetes mellitus type 2, history of Roux-en-Y, GERD, hypothyroidism, hypertension, neuropathy and more admitted on 03/21/2023 with aphasia. Patient diagnosed with acute CVA. Patient was also found to have submassive bilateral PE, concern of cor pulmonale with right ventricular strain, a large and transient clot between atrium. Pt with history of hospitalization for significant GI bleed recently. PMT consulted to discuss GOC.   Assessment: I have reviewed medical records including EPIC notes, labs and imaging, assessed the patient and then met with patient, boyfriend Thereasa Distance, daughter Maryln Gottron, and family member Morene Antu  to discuss diagnosis prognosis, GOC, EOL wishes, disposition and options.  I introduced Palliative Medicine as specialized medical care for people living with serious illness. It focuses on providing relief from the symptoms and stress of a serious illness. The goal is to improve quality of life for both the patient and the family.  Family shares prior to current situation patient dealt with chronic pain and was unable to independently care for herself - depended on others. She had some functional limitations. She had good appetite.    We discussed patient's current illness and what it means in the larger context of patient's on-going co-morbidities.  Extensively reviewed current medical situation including her acute CVA and expressive aphasia. Discussed PE with R heart strain. We discussed large clot between R and L atrium. We discussed high risk for complications. We discussed patient not candidate for surgical intervention  or thrombolytic therapy. Discussed she will continue on anticoagulation.   I attempted to elicit values and goals of care important to the patient.  We discussed that for now the plan remains to continue anticoagulation and work towards rehab. We discussed need to talk about "what ifs". We review potential scenarios and possible medical options. Throughout discussion family and patient agree they would want all medical care offered to prolong life including CPR, intubation, PEG tube. They did state they were unsure if they would want trach/long term intubation and they agreed to spend more time discussing this amongst themselves. Discussed importance of doing this while patient is able to participate just in case she were to have significant event.   Discussed with family the importance of continued conversation with family and the medical providers regarding overall plan of care and treatment options, ensuring decisions are within the context of the patient's values and GOCs.    Questions and concerns were addressed. The family was encouraged to  call with questions or concerns.    Recommendations/Plan: Patient/family interested in full code/full scope care - they will continue discussions amongst themselves They will contact PMT with additional questions or concerns Having R leg pain - requested RN administer PRN oxy  Care plan was discussed with patient, daughter, boyfriend, RN  Thank you for allowing the Palliative Medicine Team to assist in the care of this patient.   Total Time 80 minutes Prolonged Time Billed  yes   Time spent includes: Detailed review of medical records (labs, imaging, vital signs), medically appropriate exam, discussion with treatment team, counseling and educating patient, family and/or staff, documenting clinical information, medication management and coordination of care.     *Please note that this is a verbal dictation therefore any spelling or grammatical errors  are due to the "[employer]" system interpretation.  Gerlean Ren, DNP, [facility] Palliative Medicine Team Team Phone # [phone]  Pager [phone]

## 2023-03-26 NOTE — Progress Notes (Addendum)
Addendum 10/12 9:44 am Transitioning to Xarelto AU  PHARMACY - ANTICOAGULATION CONSULT NOTE  Pharmacy Consult for heparin infusion Indication:  coronary thrombus  + acute PE w/ RHS  Allergies  Allergen Reactions   Asa [Aspirin] Anaphylaxis, Shortness Of Breath and Other (See Comments)    Respiratory distress  Pt states she has had Toradol several times without any reactions   Bee Venom Anaphylaxis   Sulfa Antibiotics Anaphylaxis   Nsaids Other (See Comments)    History of gastric bypass   Other Other (See Comments)    No blood products.  Patient did  Request that only albumin or albumin-containing products may be administered   Repatha [Evolocumab] Other (See Comments)    Unknown reaction    Patient Measurements: Height: 5\' 5"  (165.1 cm) Weight: 97.5 kg (215 lb) (weight from 8/24) IBW/kg (Calculated) : 57 Heparin Dosing Weight: 79 kg  Vital Signs: Temp: 97.5 F (36.4 C) (10/12 0426) Temp Source: Oral (10[birth year]11 1960) BP: 117/76 (10/12 0426) Pulse Rate: 88 (10/12 0426)  Labs: Recent Labs    03/24/23 0711 03/24/23 1856 03/25/23 0717 03/26/23 0430  HGB 9.4*  --  8.7* 8.5*  HCT 30.4*  --  27.9* 27.4*  PLT 296  --  297 292  HEPARINUNFRC 0.56 0.30 0.40 0.39  CREATININE  --   --   --  1.50*    Estimated Creatinine Clearance: 44.4 mL/min (A) (by C-G formula based on SCr of 1.5 mg/dL (H)).   Medical History: Past Medical History:  Diagnosis Date   Anxiety    Arthritis    Asthma    Bipolar 1 disorder (HCC)    Breast discharge    Breast lump    Breast pain    CAD (coronary artery disease) 12/03/2022    o Cath 2008: no CAD   o CCTA 10/05/19: CAC score 49, 88th percentile, nonobstructive CAD (LM 1-24 mid and dist)  o Myoview 12/31/20: EF 45, no ischemia or infarction, low risk    Chronic pain    on MS Contin and oxycodone   COPD (chronic obstructive pulmonary disease) (HCC)    Depression    Diabetes mellitus    diet and exercise controlled   Dysrhythmia    HR  in 30s on metoprolol   Escherichia coli (E. coli) infection    Fever    GERD (gastroesophageal reflux disease)    History of chest pain    History of knee replacement, total    Hypertension    Hypothyroidism    N&V (nausea and vomiting)    Peripheral neuropathy    Right foot no sensation   Sciatica    Sleep apnea    does not use CPAP   Thyroid disease    Wears glasses     Assessment: 89 YOF with a PMH of CAD and recent GI hemorrhage not on AC PTA who is admitted with left MCA stroke with concern for right hemisphere involvement and central embolic stroke. Patient now found to have left atrial mass with PFO and mass extending into the right atrium. CT positive for acute PE with CT evidence of right heart strain (RV/LV Ratio = 1.54). LE doppler + RLE age-indeterminate DVT.  Pharmacy consulted to dose heparin.  No bolus per MD and will have lower therapeutic goal due to CVA.   10/12: Heparin level therapeutic 0.39 with heparin running at 1100 units/hour. No issues with bleeding or infusion per RN. Hgb down to 8.5; PLTc WNL.   Goal of  Therapy:  Heparin level 0.3-0.5 units/ml Monitor platelets by anticoagulation protocol: Yes   Plan:  Continue heparin infusion at 1100 units/hr Check heparin level daily Continue to monitor H&H and platelets   Verdene Rio, PharmD PGY1 Pharmacy Resident

## 2023-03-26 NOTE — Plan of Care (Signed)
  Problem: Clinical Measurements: Goal: Diagnostic test results will improve Outcome: Progressing Goal: Signs and symptoms of infection will decrease Outcome: Progressing   Problem: Respiratory: Goal: Ability to maintain adequate ventilation will improve Outcome: Progressing   Problem: Education: Goal: Ability to verbalize activity precautions or restrictions will improve Outcome: Progressing Goal: Knowledge of the prescribed therapeutic regimen will improve Outcome: Progressing Goal: Understanding of discharge needs will improve Outcome: Progressing   Problem: Activity: Goal: Ability to avoid complications of mobility impairment will improve Outcome: Progressing Goal: Ability to tolerate increased activity will improve Outcome: Progressing Goal: Will remain free from falls Outcome: Progressing   Problem: Bowel/Gastric: Goal: Gastrointestinal status for postoperative course will improve Outcome: Progressing   Problem: Clinical Measurements: Goal: Ability to maintain clinical measurements within normal limits will improve Outcome: Progressing Goal: Postoperative complications will be avoided or minimized Outcome: Progressing Goal: Diagnostic test results will improve Outcome: Progressing   Problem: Pain Management: Goal: Pain level will decrease Outcome: Progressing   Problem: Skin Integrity: Goal: Will show signs of wound healing Outcome: Progressing   Problem: Health Behavior/Discharge Planning: Goal: Identification of resources available to assist in meeting health care needs will improve Outcome: Progressing   Problem: Bladder/Genitourinary: Goal: Urinary functional status for postoperative course will improve Outcome: Progressing   Problem: Education: Goal: Knowledge of disease or condition will improve Outcome: Progressing Goal: Knowledge of secondary prevention will improve (MUST DOCUMENT ALL) Outcome: Progressing Goal: Knowledge of patient specific  risk factors will improve Loraine Leriche N/A or DELETE if not current risk factor) Outcome: Progressing   Problem: Ischemic Stroke/TIA Tissue Perfusion: Goal: Complications of ischemic stroke/TIA will be minimized Outcome: Progressing   Problem: Coping: Goal: Will verbalize positive feelings about self Outcome: Progressing Goal: Will identify appropriate support needs Outcome: Progressing   Problem: Health Behavior/Discharge Planning: Goal: Ability to manage health-related needs will improve Outcome: Progressing Goal: Goals will be collaboratively established with patient/family Outcome: Progressing   Problem: Self-Care: Goal: Ability to participate in self-care as condition permits will improve Outcome: Progressing Goal: Verbalization of feelings and concerns over difficulty with self-care will improve Outcome: Progressing Goal: Ability to communicate needs accurately will improve Outcome: Progressing   Problem: Nutrition: Goal: Risk of aspiration will decrease Outcome: Progressing Goal: Dietary intake will improve Outcome: Progressing   Problem: Education: Goal: Knowledge of General Education information will improve Description: Including pain rating scale, medication(s)/side effects and non-pharmacologic comfort measures Outcome: Progressing   Problem: Health Behavior/Discharge Planning: Goal: Ability to manage health-related needs will improve Outcome: Progressing   Problem: Clinical Measurements: Goal: Ability to maintain clinical measurements within normal limits will improve Outcome: Progressing Goal: Will remain free from infection Outcome: Progressing Goal: Diagnostic test results will improve Outcome: Progressing Goal: Respiratory complications will improve Outcome: Progressing Goal: Cardiovascular complication will be avoided Outcome: Progressing   Problem: Activity: Goal: Risk for activity intolerance will decrease Outcome: Progressing   Problem:  Nutrition: Goal: Adequate nutrition will be maintained Outcome: Progressing   Problem: Coping: Goal: Level of anxiety will decrease Outcome: Progressing   Problem: Elimination: Goal: Will not experience complications related to bowel motility Outcome: Progressing Goal: Will not experience complications related to urinary retention Outcome: Progressing   Problem: Pain Managment: Goal: General experience of comfort will improve Outcome: Progressing   Problem: Safety: Goal: Ability to remain free from injury will improve Outcome: Progressing   Problem: Skin Integrity: Goal: Risk for impaired skin integrity will decrease Outcome: Progressing

## 2023-03-27 DIAGNOSIS — I2489 Other forms of acute ischemic heart disease: Secondary | ICD-10-CM | POA: Diagnosis not present

## 2023-03-27 DIAGNOSIS — I2609 Other pulmonary embolism with acute cor pulmonale: Secondary | ICD-10-CM | POA: Diagnosis not present

## 2023-03-27 DIAGNOSIS — I639 Cerebral infarction, unspecified: Secondary | ICD-10-CM | POA: Diagnosis not present

## 2023-03-27 LAB — CBC
HCT: 30.4 % — ABNORMAL LOW (ref 36.0–46.0)
Hemoglobin: 9.1 g/dL — ABNORMAL LOW (ref 12.0–15.0)
MCH: 25.7 pg — ABNORMAL LOW (ref 26.0–34.0)
MCHC: 29.9 g/dL — ABNORMAL LOW (ref 30.0–36.0)
MCV: 85.9 fL (ref 80.0–100.0)
Platelets: 315 10*3/uL (ref 150–400)
RBC: 3.54 MIL/uL — ABNORMAL LOW (ref 3.87–5.11)
RDW: 16.5 % — ABNORMAL HIGH (ref 11.5–15.5)
WBC: 6 10*3/uL (ref 4.0–10.5)
nRBC: 0 % (ref 0.0–0.2)

## 2023-03-27 LAB — BASIC METABOLIC PANEL
Anion gap: 8 (ref 5–15)
BUN: 16 mg/dL (ref 8–23)
CO2: 21 mmol/L — ABNORMAL LOW (ref 22–32)
Calcium: 8.3 mg/dL — ABNORMAL LOW (ref 8.9–10.3)
Chloride: 106 mmol/L (ref 98–111)
Creatinine, Ser: 1.47 mg/dL — ABNORMAL HIGH (ref 0.44–1.00)
GFR, Estimated: 40 mL/min — ABNORMAL LOW (ref >60)
Glucose, Bld: 118 mg/dL — ABNORMAL HIGH (ref 70–99)
Potassium: 3.9 mmol/L (ref 3.5–5.1)
Sodium: 135 mmol/L (ref 135–145)

## 2023-03-27 NOTE — Plan of Care (Signed)
Problem: Fluid Volume: Goal: Hemodynamic stability will improve Outcome: Progressing   Problem: Clinical Measurements: Goal: Diagnostic test results will improve Outcome: Progressing Goal: Signs and symptoms of infection will decrease Outcome: Progressing   Problem: Respiratory: Goal: Ability to maintain adequate ventilation will improve Outcome: Progressing   Problem: Education: Goal: Ability to verbalize activity precautions or restrictions will improve Outcome: Progressing Goal: Knowledge of the prescribed therapeutic regimen will improve Outcome: Progressing Goal: Understanding of discharge needs will improve Outcome: Progressing   Problem: Activity: Goal: Ability to avoid complications of mobility impairment will improve Outcome: Progressing Goal: Ability to tolerate increased activity will improve Outcome: Progressing Goal: Will remain free from falls Outcome: Progressing   Problem: Bowel/Gastric: Goal: Gastrointestinal status for postoperative course will improve Outcome: Progressing   Problem: Clinical Measurements: Goal: Ability to maintain clinical measurements within normal limits will improve Outcome: Progressing Goal: Postoperative complications will be avoided or minimized Outcome: Progressing Goal: Diagnostic test results will improve Outcome: Progressing   Problem: Pain Management: Goal: Pain level will decrease Outcome: Progressing   Problem: Skin Integrity: Goal: Will show signs of wound healing Outcome: Progressing   Problem: Health Behavior/Discharge Planning: Goal: Identification of resources available to assist in meeting health care needs will improve Outcome: Progressing   Problem: Bladder/Genitourinary: Goal: Urinary functional status for postoperative course will improve Outcome: Progressing   Problem: Education: Goal: Knowledge of disease or condition will improve Outcome: Progressing Goal: Knowledge of secondary prevention  will improve (MUST DOCUMENT ALL) Outcome: Progressing Goal: Knowledge of patient specific risk factors will improve Loraine Leriche N/A or DELETE if not current risk factor) Outcome: Progressing   Problem: Ischemic Stroke/TIA Tissue Perfusion: Goal: Complications of ischemic stroke/TIA will be minimized Outcome: Progressing   Problem: Coping: Goal: Will verbalize positive feelings about self Outcome: Progressing Goal: Will identify appropriate support needs Outcome: Progressing   Problem: Health Behavior/Discharge Planning: Goal: Ability to manage health-related needs will improve Outcome: Progressing Goal: Goals will be collaboratively established with patient/family Outcome: Progressing   Problem: Self-Care: Goal: Ability to participate in self-care as condition permits will improve Outcome: Progressing Goal: Verbalization of feelings and concerns over difficulty with self-care will improve Outcome: Progressing Goal: Ability to communicate needs accurately will improve Outcome: Progressing   Problem: Nutrition: Goal: Risk of aspiration will decrease Outcome: Progressing Goal: Dietary intake will improve Outcome: Progressing   Problem: Education: Goal: Knowledge of General Education information will improve Description: Including pain rating scale, medication(s)/side effects and non-pharmacologic comfort measures Outcome: Progressing   Problem: Health Behavior/Discharge Planning: Goal: Ability to manage health-related needs will improve Outcome: Progressing   Problem: Clinical Measurements: Goal: Ability to maintain clinical measurements within normal limits will improve Outcome: Progressing Goal: Will remain free from infection Outcome: Progressing Goal: Diagnostic test results will improve Outcome: Progressing Goal: Respiratory complications will improve Outcome: Progressing Goal: Cardiovascular complication will be avoided Outcome: Progressing   Problem:  Activity: Goal: Risk for activity intolerance will decrease Outcome: Progressing   Problem: Nutrition: Goal: Adequate nutrition will be maintained Outcome: Progressing   Problem: Coping: Goal: Level of anxiety will decrease Outcome: Progressing   Problem: Elimination: Goal: Will not experience complications related to bowel motility Outcome: Progressing Goal: Will not experience complications related to urinary retention Outcome: Progressing   Problem: Pain Managment: Goal: General experience of comfort will improve Outcome: Progressing   Problem: Safety: Goal: Ability to remain free from injury will improve Outcome: Progressing   Problem: Skin Integrity: Goal: Risk for impaired skin integrity will decrease Outcome: Progressing

## 2023-03-27 NOTE — Progress Notes (Signed)
Progress Note   Patient: Caroline Williams VZD:[id number] DOB: Oct 13, 1960 DOA: 03/21/2023     5 DOS: the patient was seen and examined on 03/27/2023   Brief hospital course: Caroline Williams is a 62 y.o. female with medical history significant of anxiety, bipolar 1 disorder, coronary artery disease, COPD, depression, diabetes mellitus type 2, history of Roux-en-Y, GERD, hypothyroidism, hypertension, neuropathy, presented to the hospital with aphasia.  At baseline patient does not drive or cook and is able to manage her own medication.  Patient was recently admitted hospital for melena and a hemoglobin of 3.0 and was noted to have ischemic colitis.  At that time, general surgery was consulted and patient was treated conservatively with bowel rest, liquid diet, IV Zosyn.  Apparently family does not think she is returned to her baseline since then.  On this presentation, patient was unable to express and had some pain in the right foot.  Denies any GI bleed.  Physical exam revealed patient was afebrile tachycardic with elevated blood pressure.   Imaging pertinent for left MCA stroke with concern for right hemisphere involvement as well and multiple vascular territories concerning for possible central embolic stroke.MRA/MRI brain shows patchy acute left MCA distribution infarct involving left frontal, parietal, temporal lobes without significant mass effect or hemorrhage.  A1c of 5.3 and lipid panel with LDL of 139.  CTA was done and found to have bilateral submassive PE with CT evidence of right heart strain.  Had intermediate Pesi score.  She has an extensive clot in transit in the right atrium extending into the left atrium across the PFO. This is the etiology of her stroke.  Elevated troponin and BNP, likely secondary to  or RV dysfunction.  EF of 40 to 45% which is not very far from her baseline. No aggressive diuresis recommended at this time due to severe RV dysfunction and submassive PE.  Cardiology is  on board.  PCCM and IR was also consulted but unfortunately she is not a candidate for any intervention.She is at high risk for further embolic phenomenon. Catheter directed thrombolysis or thrombectomy with IR would be high risk given the clot in transit putting her at risk for further embolic disease.  PCCM did consider half dose systemic thrombolysis but with her recent GI bleed that will be contraindicated.  Currently very limited choices, after lengthy discussion between cardiology, neurology, PCCM and IR it was decided to just continue with heparin infusion for now.  Patient will likely need lifelong anticoagulation.  Very high risk for deterioration.  Palliative care was also consulted to discuss goals of care.  10/9: Patient currently awake, appears to be disoriented and confused, talking but not answering questions appropriately.  On room air and no significant respiratory distress.  10/10: Vitals mostly stable and remained on room air.  Lower extremity venous Doppler positive for age-indeterminate right femoral, tibial and popliteal vein DVT.  Discussed with neurology and patient likely will only take anticoagulation, no antiplatelet to decrease the risk of bleeding. Hypercoagulable panel ordered. Patient had a transient SVT this morning, remained asymptomatic.  Cardiology also added metoprolol.  10/11: Hemodynamically stable.  No new deficit.  Remained on heparin infusion which can be switched to NOAC when our neurology is okay with that.  Pending palliative care meeting PT is recommending SNF  10/12: Vital stable.  Transitioning heparin to Xarelto.  Family would like to continue full code and full scope of care.  10/13: Patient remained stable, awaiting insurance authorization.  Assessment  and Plan: * Acute CVA (cerebrovascular accident) (HCC) - Evaluated by neurology and found to have a left MCA stroke with concern for right hemisphere involvement as well and multiple vascular  territories concerning for possible central embolic stroke Patient was also found to have submassive bilateral PE, concern of cor pulmonale with right ventricular strain, a large and transient clot between atrium. She was started on heparin infusion. Neurology to decide about antiplatelets Continue with Crestor PT and OT are recommending SNF  Pulmonary embolism with acute cor pulmonale (HCC) CTA positive for bilateral submassive PE with right ventricular strain.  Echocardiogram with concern of clot in transit.  Lengthy interdisciplinary conversation between cardiology, PCCM, neurology and IR and unfortunately patient is not a candidate for any surgical intervention or thrombolytic therapy. Will be very high risk for progression.  Currently stable on room air Lower extremity venous Doppler positive for right lower extremity age-indeterminate significant DVT. -Switch heparin with Xarelto -Closely monitor  Paroxysmal SVT (supraventricular tachycardia) (HCC) Patient had 1 transient episode of SVT this morning, remained asymptomatic. Cardiology added metoprolol -Continue to monitor  Acute clinical systolic heart failure (HCC) Echocardiogram with EF of 40 to 45%, which is slightly below than her baseline of 50% Also concern of right ventricular strain with extensive PE and clot in transit. Not a candidate for aggressive diuresis although troponin and BNP were elevated due to acute right ventricular strain and massive PE. -Cardiology is on board  History of GI bleed History of recent GI bleed and ischemic colitis. Hemoglobin currently stable at 10.2 -Monitor hemoglobin -Transfuse if below 7  GERD (gastroesophageal reflux disease) - With history of melena/GI hemorrhage - Continue Protonix - Closely monitor hemoglobin, at the end of August it was as low as 3.0 - Continue to monitor  Diabetes mellitus with neuropathy (HCC) Well-controlled with A1c of 5.3 - Continue to monitor CBG - If  needed add sliding scale coverage  Essential (primary) hypertension - Holding olmesartan, hydrochlorothiazide, Aldactone, Imdur for permissive hypertension  Chronic low back pain History of L4-L5 decompression and fusion. -Continue home MS Contin and oxycodone  Hypothyroidism - TSH markedly elevated last time it was checked approximately 6 weeks ago, Improved but still elevated. - Continue Synthroid-need to have a close follow-up with PCP for further dose adjustment  COPD mixed type (HCC) - Continue Anoro and albuterol  Hyperlipidemia - Lipid panel pending   Psychiatric disorder - Continue Rexulti, Trintellix, Xanax and doxepin  AKI (acute kidney injury) (HCC) With history of CKD stage IIIb - Creatinine at baseline 1.2, creatinine on admission was 1.68, slowly improving - Holding nephrotoxic agents when possible - Monitor renal function   Subjective: Patient with much improved physical activity and dysarthria, still having some word finding difficulty.  Physical Exam: Vitals:   03/26/23 2339 03/27/23 0330 03/27/23 0722 03/27/23 1126  BP: 121/82 130/89 131/78 119/74  Pulse: 81 80 78 89  Resp: 18 18 17 18   Temp: 98.2 F (36.8 C) 98.6 F (37 C) (!) 97.5 F (36.4 C) 98.6 F (37 C)  TempSrc: Oral Oral Oral Oral  SpO2: 97% 97% 97% 99%  Weight:      Height:       General.  Frail lady, in no acute distress. Pulmonary.  Lungs clear bilaterally, normal respiratory effort. CV.  Regular rate and rhythm, no JVD, rub or murmur. Abdomen.  Soft, nontender, nondistended, BS positive. CNS.  Alert and oriented .  No focal neurologic deficit.  Mild dysarthria. Extremities.  No edema, no  cyanosis, pulses intact and symmetrical. Psychiatry.  Judgment and insight appears normal.     Data Reviewed: Prior data reviewed  Family Communication: Discussed with daughter at bedside  Disposition: Status is: Inpatient Remains inpatient appropriate because: Severity of illness  Planned  Discharge Destination: Skilled nursing facility  DVT prophylaxis.  Heparin infusion Time spent: 40 minutes  This record has been created using Conservation officer, historic buildings. Errors have been sought and corrected,but may not always be located. Such creation errors do not reflect on the standard of care.   Author: Arnetha Courser, MD 03/27/2023 1:38 PM  For on call review [email or web address].

## 2023-03-27 NOTE — Progress Notes (Signed)
Physical Therapy Treatment Patient Details Name: Caroline Williams MRN: [id number] DOB: 1961/03/14 Today's Date: 03/27/2023   History of Present Illness Pt is a 62 y/o female presenting to [facility] hospital on 03/21/2023 with aphasia. MRI demonstrates bilateral acute ischemic infarcts, suggestive of cardioembolic source. Pt was recently discharged 10/3 after management of ischemic colitis. PMH includes: L4-5 PLIF 7/31, anxiety, arthritis, bipolar 1, CAD, COPD, depression, DM, HTN, hernia repair, R TKR.    PT Comments  Pt is progressing towards goals. Pt was Min A for supine to sitting EOB able to get her RLE mostly to EOB without assist but is limited by knee pain this session. Pt was Min A for lateral scoot from EOB to recliner. Pt continues with expressive aphasia. Due to pt current functional status, home set up and available assistance at home recommending skilled physical therapy services < 3 hours/day on discharge from acute care hospital setting in order to decrease risk for falls, injury and re-hospitalization. Pt tolerated treatment session well but was limited by pain in the RLE.     If plan is discharge home, recommend the following: A lot of help with walking and/or transfers;Assistance with cooking/housework;Assist for transportation;Supervision due to cognitive status   Can travel by private vehicle     Yes  Equipment Recommendations  Other (comment) (defer to post acute)       Precautions / Restrictions Precautions Precautions: Fall Precaution Comments: Watch BP, HR pt is impulsive Restrictions Weight Bearing Restrictions: No     Mobility  Bed Mobility Overal bed mobility: Needs Assistance Bed Mobility: Supine to Sit     Supine to sit: Min assist     General bed mobility comments: Extra time and pt was able to get RLE mostly to EOB without physical assist; required Min physical A right at EOB. Able to get trunk to mid line without physical assist today     Transfers Overall transfer level: Needs assistance Equipment used: Rolling walker (2 wheels) Transfers: Bed to chair/wheelchair/BSC            Lateral/Scoot Transfers: Min assist General transfer comment: Initially tried to stand EOB pt was reporing high levels of pain in the R knee. Pt then shoved RW away and started scooting toward EOB. Pt had to be stopped from scooting off EOB, She then started performing lateral scoot toward recliner and physical therapist pivoted into assist for lateral scoot.    Ambulation/Gait   General Gait Details: Unable to get to gait today due to current functional mobility and pain in the R knee.     Modified Rankin (Stroke Patients Only) Modified Rankin (Stroke Patients Only) Pre-Morbid Rankin Score: Moderate disability Modified Rankin: Moderately severe disability     Balance Overall balance assessment: Needs assistance Sitting-balance support: No upper extremity supported, Feet supported Sitting balance-Leahy Scale: Fair        Cognition Arousal: Alert Behavior During Therapy: Impulsive Overall Cognitive Status: Difficult to assess         General Comments: expressive aphasia.               Pertinent Vitals/Pain Pain Assessment Pain Assessment: Faces Faces Pain Scale: Hurts whole lot Pain Location: R knee and LB Pain Descriptors / Indicators: Moaning, Grimacing, Guarding Pain Intervention(s): Monitored during session, Limited activity within patient's tolerance     PT Goals (current goals can now be found in the care plan section) Acute Rehab PT Goals Patient Stated Goal: To improve mobility PT Goal Formulation: Patient unable to  participate in goal setting Time For Goal Achievement: 04/05/23 Potential to Achieve Goals: Good Progress towards PT goals: Progressing toward goals    Frequency    Min 1X/week      PT Plan  Continue with current POC       AM-PAC PT "6 Clicks" Mobility   Outcome Measure   Help needed turning from your back to your side while in a flat bed without using bedrails?: A Little Help needed moving from lying on your back to sitting on the side of a flat bed without using bedrails?: A Little Help needed moving to and from a bed to a chair (including a wheelchair)?: A Little Help needed standing up from a chair using your arms (e.g., wheelchair or bedside chair)?: A Lot Help needed to walk in hospital room?: A Lot Help needed climbing 3-5 steps with a railing? : Total 6 Click Score: 14    End of Session Equipment Utilized During Treatment: Gait belt Activity Tolerance: Patient tolerated treatment well;Patient limited by pain Patient left: with call bell/phone within reach;in chair;with chair alarm set Nurse Communication: Mobility status PT Visit Diagnosis: Other abnormalities of gait and mobility (R26.89);Muscle weakness (generalized) (M62.81);Other symptoms and signs involving the nervous system (R29.898)     Time: 7425-9563 PT Time Calculation (min) (ACUTE ONLY): 26 min  Charges:    $Therapeutic Activity: 23-37 mins PT General Charges $$ ACUTE PT VISIT: 1 Visit                     Harrel Carina, DPT, CLT  Acute Rehabilitation Services Office: [phone] (Secure chat preferred)    Claudia Desanctis 03/27/2023, 11:21 AM

## 2023-03-28 DIAGNOSIS — I639 Cerebral infarction, unspecified: Secondary | ICD-10-CM | POA: Diagnosis not present

## 2023-03-28 LAB — PROTEIN S, TOTAL: Protein S Ag, Total: 135 % (ref 60–150)

## 2023-03-28 LAB — BASIC METABOLIC PANEL
Anion gap: 10 (ref 5–15)
BUN: 19 mg/dL (ref 8–23)
CO2: 20 mmol/L — ABNORMAL LOW (ref 22–32)
Calcium: 8.3 mg/dL — ABNORMAL LOW (ref 8.9–10.3)
Chloride: 104 mmol/L (ref 98–111)
Creatinine, Ser: 1.36 mg/dL — ABNORMAL HIGH (ref 0.44–1.00)
GFR, Estimated: 44 mL/min — ABNORMAL LOW (ref >60)
Glucose, Bld: 104 mg/dL — ABNORMAL HIGH (ref 70–99)
Potassium: 4.1 mmol/L (ref 3.5–5.1)
Sodium: 134 mmol/L — ABNORMAL LOW (ref 135–145)

## 2023-03-28 LAB — LUPUS ANTICOAGULANT PANEL
DRVVT: 36 s (ref 0.0–47.0)
PTT Lupus Anticoagulant: 35.1 s (ref 0.0–43.5)

## 2023-03-28 LAB — CBC
HCT: 27.3 % — ABNORMAL LOW (ref 36.0–46.0)
Hemoglobin: 8.5 g/dL — ABNORMAL LOW (ref 12.0–15.0)
MCH: 26.7 pg (ref 26.0–34.0)
MCHC: 31.1 g/dL (ref 30.0–36.0)
MCV: 85.8 fL (ref 80.0–100.0)
Platelets: 324 10*3/uL (ref 150–400)
RBC: 3.18 MIL/uL — ABNORMAL LOW (ref 3.87–5.11)
RDW: 16.5 % — ABNORMAL HIGH (ref 11.5–15.5)
WBC: 5.3 10*3/uL (ref 4.0–10.5)
nRBC: 0 % (ref 0.0–0.2)

## 2023-03-28 LAB — PROTEIN C ACTIVITY: Protein C Activity: 120 % (ref 73–180)

## 2023-03-28 LAB — PROTEIN S ACTIVITY: Protein S Activity: 70 % (ref 63–140)

## 2023-03-28 NOTE — TOC Progression Note (Signed)
Transition of Care [facility]) - Progression Note    Patient Details  Name: Caroline Williams MRN: [id number] Date of Birth: 12/01/1960  Transition of Care [facility]) CM/SW Contact  Baldemar Lenis, [place] Phone Number: 03/28/2023, 11:45 AM  Clinical Narrative:   CSW spoke with daughter, Maryln Gottron, to provide bed offers. Family to review and determine SNF choice. CSW to follow.    Expected Discharge Plan: Skilled Nursing Facility    Expected Discharge Plan and Services                                               Social Determinants of Health (SDOH) Interventions SDOH Screenings   Food Insecurity: No Food Insecurity (03/22/2023)  Housing: Low Risk  (03/22/2023)  Transportation Needs: Unmet Transportation Needs (03/22/2023)  Utilities: Not At Risk (03/22/2023)  Alcohol Screen: Low Risk  (11/03/2022)  Depression (PHQ2-9): Low Risk  (11/03/2022)  Financial Resource Strain: Medium Risk (11/03/2022)  Physical Activity: Inactive (11/03/2022)  Social Connections: Moderately Integrated (11/03/2022)  Stress: Stress Concern Present (11/03/2022)  Tobacco Use: Medium Risk (03/22/2023)    Readmission Risk Interventions    02/02/2023   11:22 AM  Readmission Risk Prevention Plan  Transportation Screening Complete  PCP or Specialist Appt within 5-7 Days Complete  Home Care Screening Complete

## 2023-03-28 NOTE — Progress Notes (Addendum)
Progress Note  Patient Name: Caroline Williams Date of Encounter: 03/28/2023  Primary Cardiologist: Orbie Pyo, MD  Subjective   Still with expressive aphasia. Appears comfortable. Follows commands.  Inpatient Medications    Scheduled Meds:  brexpiprazole  2 mg Oral Daily   cilostazol  50 mg Oral BID   dapagliflozin propanediol  5 mg Oral Daily   doxepin  75 mg Oral QHS   fesoterodine  4 mg Oral Daily   levothyroxine  150 mcg Oral Q0600   metoprolol succinate  25 mg Oral Daily   morphine  15 mg Oral Q12H   pantoprazole  40 mg Oral Daily   Rivaroxaban  15 mg Oral BID WC   Followed by   Melene Muller ON 04/16/2023] rivaroxaban  20 mg Oral Q supper   rOPINIRole  2 mg Oral QHS   rosuvastatin  40 mg Oral Daily   sodium chloride flush  10 mL Intravenous Q12H   sodium chloride flush  3 mL Intravenous Once   umeclidinium-vilanterol  1 puff Inhalation Daily   vortioxetine HBr  20 mg Oral Daily   Continuous Infusions:  PRN Meds: acetaminophen **OR** acetaminophen, albuterol, ALPRAZolam, ondansetron **OR** ondansetron (ZOFRAN) IV, mouth rinse, oxyCODONE   Vital Signs    Vitals:   03/27/23 2324 03/28/23 0322 03/28/23 0802 03/28/23 0822  BP: (!) 145/86 118/79  128/71  Pulse: 89 85  88  Resp:  18  17  Temp: 98.6 F (37 C) 98.3 F (36.8 C)  98.2 F (36.8 C)  TempSrc: Oral Oral  Oral  SpO2: 93% 96% 95% 99%  Weight:      Height:       No intake or output data in the 24 hours ending 03/28/23 1008    03/22/2023    3:00 PM 02/05/2023    8:37 AM 01/31/2023    9:38 AM  Last 3 Weights  Weight (lbs) 215 lb 215 lb 227 lb 4.7 oz  Weight (kg) 97.523 kg 97.523 kg 103.1 kg     Telemetry    NSR - Personally Reviewed  Physical Exam   GEN: No acute distress.  HEENT: Normocephalic, atraumatic, sclera non-icteric. Neck: No JVD or bruits. Cardiac: RRR no murmurs, rubs, or gallops.  Respiratory: Clear to auscultation bilaterally. Breathing is unlabored. GI: Soft, nontender,  non-distended, BS +x 4. MS: no deformity. Extremities: No clubbing or cyanosis. No edema. Striations present indicating previously more swollen, not presently. Distal pedal pulses are 2+ and equal bilaterally. Neuro:  Awake, alert, follows commands but remains aphasic Psych: Calm affect.  Labs    High Sensitivity Troponin:   Recent Labs  Lab 03/22/23 1729 03/22/23 1817  TROPONINIHS 1,100* 1,027*      Cardiac EnzymesNo results for input(s): "TROPONINI" in the last 168 hours. No results for input(s): "TROPIPOC" in the last 168 hours.   Chemistry Recent Labs  Lab 03/21/23 1746 03/21/23 1750 03/22/23 1211 03/23/23 0517 03/26/23 0430 03/27/23 0917 03/28/23 0419  NA 138   < > 139   < > 135 135 134*  K 4.1   < > 3.0*   < > 3.9 3.9 4.1  CL 104   < > 106   < > 105 106 104  CO2 21*  --  22   < > 21* 21* 20*  GLUCOSE 112*   < > 117*   < > 87 118* 104*  BUN 19   < > 17   < > 14 16 19   CREATININE 1.68*   < >  1.56*   < > 1.50* 1.47* 1.36*  CALCIUM 9.1  --  8.3*   < > 8.4* 8.3* 8.3*  PROT 7.5  --  6.6  --   --   --   --   ALBUMIN 3.1*  --  2.7*  --   --   --   --   AST 25  --  23  --   --   --   --   ALT 14  --  10  --   --   --   --   ALKPHOS 153*  --  137*  --   --   --   --   BILITOT 1.1  --  0.8  --   --   --   --   GFRNONAA 34*  --  37*   < > 39* 40* 44*  ANIONGAP 13  --  11   < > 9 8 10    < > = values in this interval not displayed.     Hematology Recent Labs  Lab 03/26/23 0430 03/27/23 0917 03/28/23 0419  WBC 6.2 6.0 5.3  RBC 3.18* 3.54* 3.18*  HGB 8.5* 9.1* 8.5*  HCT 27.4* 30.4* 27.3*  MCV 86.2 85.9 85.8  MCH 26.7 25.7* 26.7  MCHC 31.0 29.9* 31.1  RDW 16.3* 16.5* 16.5*  PLT 292 315 324    BNP Recent Labs  Lab 03/22/23 1729  BNP 1,241.6*     DDimer No results for input(s): "DDIMER" in the last 168 hours.   Radiology    No results found.  Cardiac Studies   2d echo 03/22/23    1. There is a mobile clot/thrombus in transit sitting in the  interatrial  septum.   2. Left ventricular ejection fraction, by estimation, is 40 to 45%. The  left ventricle has mildly decreased function. The left ventricle  demonstrates global hypokinesis. Left ventricular diastolic parameters are  indeterminate.   3. Right ventricular systolic function is normal. The right ventricular  size is normal.   4. Thrombus noted in the left atrium.   5. The mitral valve is normal in structure. No evidence of mitral valve  regurgitation. No evidence of mitral stenosis.   6. The aortic valve is normal in structure. Aortic valve regurgitation is  not visualized. No aortic stenosis is present.   7. There is borderline dilatation of the ascending aorta, measuring 36  mm.   8. The inferior vena cava is normal in size with greater than 50%  respiratory variability, suggesting right atrial pressure of 3 mmHg.   9. Thrombus seen in the right atrium.   Carotid duplex 03/22/23   Summary:  Right Carotid: Velocities in the right ICA are consistent with a 1-39%  stenosis.   Left Carotid: Velocities in the left ICA are consistent with a 1-39%  stenosis.   Vertebrals: Bilateral vertebral arteries demonstrate antegrade flow.  Subclavians: Normal flow hemodynamics were seen in bilateral subclavian               arteries.   *See table(s) above for measurements and observations.     Patient Profile     62 y.o. female with mild nonobstructive CAD by cor CT 2021 with nonischemic nuc 12/2022, frequent PVCs by monitor 11/2022, HTN, HLD on Praluent, DM2, COPD, tobacco abuse, hypothyroidism, chronic pain, mood disorder, RLS, Roux-en-Y lumbar surgery 01/2023, GI hemorrhage/ABL anemia (Hgb 3.0) felt due to ischemic colitis 01/2023. Admitted with acute CVA on 03/22/23. Cardiology consulted  for concern for R/L atrial mass ultimately felt to be clot in transit with acute PE/DVT - stroke felt due to PE that crossed into left atrium. Also found to have elevated troponin (felt demand  ischemia from PE/RV strain), paroxysmal atrial tachycardia and acute HFmrEF.  Assessment & Plan    1. Acute CVA with acute PE/DVT - MRI of brain confirms patchy acute L MCA distribution infarction involving L frontal, parietal and temporal lobs. Small infarcts involving contralateral R frontal centrum semi ovale and L greater than R cerebelum - etiology of stroke felt to be PE that crossed into LA - CTA of chest 10/8 showed PE, also showed clot in transit across interatrial septum from R atrium into L atrium; clot in transit confirmed on echo - venous doppler 10/9 showed age indeterminate DVT in the R femoral vein, R posterior tibial veins and R peroneal veins - evaluated by PCCM, felt not to be a candidate for lytic due to recent GI hemorrhage - not a good candidate for surgery. Recommendation is pursue medical management - initially started on Pletal + Plavix by neurology then transitioned to Pletal + Xarelto per pharmacy   2. Paroxysmal atrial tachycardia, also with history of frequent PVCs - was on Toprol 25mg  as OP then decreased to 12.5mg  over the summer due to dizziness, eventually fell off - resumed on 25mg  daily here and tolerating - management of abnormal TSH per primary team  3. Acute HFmrEF - echo here EF 40-45% with global HK, do not anticipate plans for invasive ischemic assessment given recent clotting issues above and fairly recent nonischemic nuc, ? Stress mediated cardiomyopathy - continue metoprolol - Friday notes indicate plan to hold off on ARB, will review GDMT with MD - no role for diuresis presently - will update weight  4. H/o GI hemorrhage 01/2023, anemia - anemia being followed by primary team, Hgb 8-9 range  5. Nonobstructive CAD: seen on previous coronary CT 09/2022 - nonischemic nuc 12/2022 - elevated troponin felt due to demand ischemia/RV strain - not on ASA due to plans for DOAC  6. CKD 3b  - current Cr is likely baseline   Tentative scheduled APP  f/u 04/13/23 and put on AVS; also has appt with Dr. Lynnette Caffey in Dec 2024 which we will keep  For questions or updates, please contact [facility] Please consult [email or web address] for contact info under Cardiology/STEMI.  Signed, Laurann Montana, PA-C 03/28/2023, 10:08 AM     History and all data above reviewed.  Patient examined.  I agree with the findings as above.   The patient denies chest pain.  She has significant dysarthria.  No acute SOB.   I spoke with her and her husband.   The patient exam reveals COR:RRR  ,  Lungs: Clear  ,  Abd: Positive bowel sounds, no rebound no guarding, Ext No edema  .  All available labs, radiology testing, previous records reviewed. Agree with documented assessment and plan.   PE:  Now on anticoagulation.  No change in therapy.  Follow CBC closely as an out patient.   Cardiomyopathy:  BP is labile.  I will hold off on ARB at this time.  Might start tomorrow if her BP allows.    Fayrene Fearing Zacharee Gaddie  11:34 AM  03/28/2023

## 2023-03-28 NOTE — Discharge Instructions (Signed)
Information on my medicine - XARELTO (rivaroxaban)  This medication education was reviewed with me or my healthcare representative as part of my discharge preparation.  WHY WAS XARELTO PRESCRIBED FOR YOU? Xarelto was prescribed to treat blood clots that may have been found in the veins of your legs (deep vein thrombosis) or in your lungs (pulmonary embolism) and to reduce the risk of them occurring again.  What do you need to know about Xarelto? The starting dose is one 15 mg tablet taken TWICE daily with food for the FIRST 21 DAYS then on 04/16/2023  the dose is changed to one 20 mg tablet taken ONCE A DAY with your evening meal.  DO NOT stop taking Xarelto without talking to the health care provider who prescribed the medication.  Refill your prescription for 20 mg tablets before you run out.  After discharge, you should have regular check-up appointments with your healthcare provider that is prescribing your Xarelto.  In the future your dose may need to be changed if your kidney function changes by a significant amount.  What do you do if you miss a dose? If you are taking Xarelto TWICE DAILY and you miss a dose, take it as soon as you remember. You may take two 15 mg tablets (total 30 mg) at the same time then resume your regularly scheduled 15 mg twice daily the next day.  If you are taking Xarelto ONCE DAILY and you miss a dose, take it as soon as you remember on the same day then continue your regularly scheduled once daily regimen the next day. Do not take two doses of Xarelto at the same time.   Important Safety Information Xarelto is a blood thinner medicine that can cause bleeding. You should call your healthcare provider right away if you experience any of the following: Bleeding from an injury or your nose that does not stop. Unusual colored urine (red or dark brown) or unusual colored stools (red or black). Unusual bruising for unknown reasons. A serious fall or if you  hit your head (even if there is no bleeding).  Some medicines may interact with Xarelto and might increase your risk of bleeding while on Xarelto. To help avoid this, consult your healthcare provider or pharmacist prior to using any new prescription or non-prescription medications, including herbals, vitamins, non-steroidal anti-inflammatory drugs (NSAIDs) and supplements.  This website has more information on Xarelto: VisitDestination.com.br.

## 2023-03-28 NOTE — Plan of Care (Signed)
Problem: Activity: Goal: Ability to avoid complications of mobility impairment will improve Outcome: Progressing Goal: Ability to tolerate increased activity will improve Outcome: Progressing Goal: Will remain free from falls Outcome: Progressing   Problem: Clinical Measurements: Goal: Ability to maintain clinical measurements within normal limits will improve Outcome: Progressing

## 2023-03-28 NOTE — Progress Notes (Signed)
PROGRESS NOTE  Caroline Williams  ZOX:[id number] DOB: 09-Jul-1960 DOA: 03/21/2023 PCP: Hoy Register, MD   Brief Narrative: Patient is a 62 year old female with history of anxiety, bipolar 1 disorder, coronary artery disease, COPD, depression, diabetes type 2, GERD, hypothyroidism, hypertension who presented with inability to speak.  MRA/MRI brain shows patchy acute left MCA distribution infarct involving left frontal, parietal, temporal lobes without significant mass effect or hemorrhage.  Neurology was following.  Hospital course also remarkable for finding of bilateral submassive PE with CT evidence of right heart strain.  Cardiology, PCCM consulted, no plan for intervention.  Currently on Xarelto.  PT/OT recommending SNF.  Medically stable for discharge whenever possible.  Waiting for bed  Assessment & Plan:  Principal Problem:   Acute CVA (cerebrovascular accident) (HCC) Active Problems:   Pulmonary embolism with acute cor pulmonale (HCC)   Acute clinical systolic heart failure (HCC)   Paroxysmal SVT (supraventricular tachycardia) (HCC)   GERD (gastroesophageal reflux disease)   History of GI bleed   Diabetes mellitus with neuropathy (HCC)   Essential (primary) hypertension   Chronic low back pain   Hypothyroidism   COPD mixed type (HCC)   Hyperlipidemia   AKI (acute kidney injury) (HCC)   Psychiatric disorder   Acute CVA: Presented with aphasia.  Neurology consulted.  MRA/MRI brain shows patchy acute left MCA distribution infarct involving left frontal, parietal, temporal lobes without significant mass effect or hemorrhage.  Neurology was following.  Stroke workup completed.  A1c 5.3, LDL of 139.  Currently on Pletal, Xarelto, statin.  PT/OT recommending SNF on discharge.  TOC following.Has significant dysarthria.  Has significant weakness of right lower extremity.  Bilateral submassive PE with acute cor pulmonale: CTA positive for bilateral submassive PE with right ventricular  strain.  Echocardiogram with concern of clot in transit.  Not a candidate for embolectomy.  PCCM, cardiology, neurology, IR.  Currently on Xarelto.  Patient is not a candidate for thrombolytic therapy.  Currently stable on room air.  Lower extremity Doppler showed age-indeterminate DVT.  Paroxysmal SVT: Had an episode of SVT, currently resolved.  On metoprolol.  Acute systolic CHF: Echo showed EF of 40 to 45%.  Currently euvolemic.  Cardiology was following.  We recommend to follow-up with cardiology as an outpatient  Normocytic anemia: Chronic, stable  History of GI bleed: Currently hemoglobin stable.  Monitor intermittently  GERD: Continue Protonix  Diabetes type 2: A1c 5.3.  Continue sliding scale.  Monitor blood sugars  Hypertension: Currently blood pressure stable.  On ARB, hydrochlorothiazide, Aldactone, Imdur  Chronic low back pain: History of L4-L5 decompression, fusion.  Continue supportive care, pain management  Hypothyroidism: Continue Synthyroid.  TSH elevated.  Check TFT in 4 to 6 weeks as an outpatient  COPD: Currently stable not on exacerbation.  On room air.  On Anoro, albuterol  Hyperlipidemia: Continue statin.  LDL of 139  AKI on CKD stage IIIa: Baseline creatinine around 1.2.  Currently kidney function at baseline.  Anxiety/depression:Continue Rexulti, Trintellix, Xanax and doxepin   Goals of care: Goals of care discussed with family.  Currently remains full code.       DVT prophylaxis: Rivaroxaban (XARELTO) tablet 15 mg  rivaroxaban (XARELTO) tablet 20 mg     Code Status: Full Code  Family Communication: Called and discussed with daughter Maryln Gottron on phone on 10/14  Patient status:Inpatient  Patient is from :Home  Anticipated discharge to:SnF  Estimated DC date:whenever possible   Consultants: Cardiology, PCCM, IR  Procedures: None  Antimicrobials:  Anti-infectives (From admission, onward)    None       Subjective: Patient seen and  examined at bedside today.  Hemodynamically stable.  She is alert and awake and mostly oriented.  She has significant dysarthria.  Complains of some discomfort on the right lower extremity.  Right leg is still very weak.  Not in any kind of distress  Objective: Vitals:   03/27/23 2324 03/28/23 0322 03/28/23 0802 03/28/23 0822  BP: (!) 145/86 118/79  128/71  Pulse: 89 85  88  Resp:  18  17  Temp: 98.6 F (37 C) 98.3 F (36.8 C)  98.2 F (36.8 C)  TempSrc: Oral Oral  Oral  SpO2: 93% 96% 95% 99%  Weight:      Height:       No intake or output data in the 24 hours ending 03/28/23 1021 Filed Weights   03/22/23 1500  Weight: 97.5 kg    Examination:  General exam: Overall comfortable, not in distress, deconditioned, dysarthria HEENT: PERRL Respiratory system:  no wheezes or crackles  Cardiovascular system: S1 & S2 heard, RRR.  Gastrointestinal system: Abdomen is nondistended, soft and nontender. Central nervous system: Alert and awake, commands, dysarthric, motor of 2/5 on the right lower extremity, 4/5 on the left lower extremity, 5/5 on both right and left upper extremities Extremities: No edema, no clubbing ,no cyanosis Skin: No rashes, no ulcers,no icterus     Data Reviewed: I have personally reviewed following labs and imaging studies  CBC: Recent Labs  Lab 03/21/23 1746 03/21/23 1750 03/22/23 1211 03/23/23 0517 03/24/23 0711 03/25/23 0717 03/26/23 0430 03/27/23 0917 03/28/23 0419  WBC 9.7  --  9.3   < > 7.3 6.5 6.2 6.0 5.3  NEUTROABS 7.8*  --  7.7  --   --   --   --   --   --   HGB 10.4*   < > 9.3*   < > 9.4* 8.7* 8.5* 9.1* 8.5*  HCT 33.9*   < > 30.1*   < > 30.4* 27.9* 27.4* 30.4* 27.3*  MCV 85.8  --  85.3   < > 84.0 84.5 86.2 85.9 85.8  PLT 366  --  312   < > 296 297 292 315 324   < > = values in this interval not displayed.   Basic Metabolic Panel: Recent Labs  Lab 03/22/23 1211 03/23/23 0517 03/26/23 0430 03/27/23 0917 03/28/23 0419  NA 139 139 135  135 134*  K 3.0* 4.2 3.9 3.9 4.1  CL 106 106 105 106 104  CO2 22 17* 21* 21* 20*  GLUCOSE 117* 84 87 118* 104*  BUN 17 14 14 16 19   CREATININE 1.56* 1.53* 1.50* 1.47* 1.36*  CALCIUM 8.3* 8.6* 8.4* 8.3* 8.3*  MG 1.9 2.0  --   --   --      No results found for this or any previous visit (from the past 240 hour(s)).   Radiology Studies: No results found.  Scheduled Meds:  brexpiprazole  2 mg Oral Daily   cilostazol  50 mg Oral BID   dapagliflozin propanediol  5 mg Oral Daily   doxepin  75 mg Oral QHS   fesoterodine  4 mg Oral Daily   levothyroxine  150 mcg Oral Q0600   metoprolol succinate  25 mg Oral Daily   morphine  15 mg Oral Q12H   pantoprazole  40 mg Oral Daily   Rivaroxaban  15 mg Oral BID WC  Followed by   Melene Muller ON 04/16/2023] rivaroxaban  20 mg Oral Q supper   rOPINIRole  2 mg Oral QHS   rosuvastatin  40 mg Oral Daily   sodium chloride flush  10 mL Intravenous Q12H   sodium chloride flush  3 mL Intravenous Once   umeclidinium-vilanterol  1 puff Inhalation Daily   vortioxetine HBr  20 mg Oral Daily   Continuous Infusions:   LOS: 6 days   Burnadette Pop, MD [facility] P10/14/2024, 10:21 AM

## 2023-03-29 DIAGNOSIS — M25512 Pain in left shoulder: Secondary | ICD-10-CM | POA: Diagnosis not present

## 2023-03-29 DIAGNOSIS — N390 Urinary tract infection, site not specified: Secondary | ICD-10-CM | POA: Diagnosis not present

## 2023-03-29 DIAGNOSIS — I471 Supraventricular tachycardia, unspecified: Secondary | ICD-10-CM | POA: Diagnosis not present

## 2023-03-29 DIAGNOSIS — Z7401 Bed confinement status: Secondary | ICD-10-CM | POA: Diagnosis not present

## 2023-03-29 DIAGNOSIS — I469 Cardiac arrest, cause unspecified: Secondary | ICD-10-CM | POA: Diagnosis not present

## 2023-03-29 DIAGNOSIS — R0689 Other abnormalities of breathing: Secondary | ICD-10-CM | POA: Diagnosis not present

## 2023-03-29 DIAGNOSIS — Z7901 Long term (current) use of anticoagulants: Secondary | ICD-10-CM | POA: Diagnosis not present

## 2023-03-29 DIAGNOSIS — E785 Hyperlipidemia, unspecified: Secondary | ICD-10-CM | POA: Diagnosis not present

## 2023-03-29 DIAGNOSIS — R55 Syncope and collapse: Secondary | ICD-10-CM | POA: Diagnosis not present

## 2023-03-29 DIAGNOSIS — K921 Melena: Secondary | ICD-10-CM | POA: Diagnosis not present

## 2023-03-29 DIAGNOSIS — F99 Mental disorder, not otherwise specified: Secondary | ICD-10-CM | POA: Diagnosis not present

## 2023-03-29 DIAGNOSIS — I959 Hypotension, unspecified: Secondary | ICD-10-CM | POA: Diagnosis not present

## 2023-03-29 DIAGNOSIS — E039 Hypothyroidism, unspecified: Secondary | ICD-10-CM | POA: Diagnosis not present

## 2023-03-29 DIAGNOSIS — E1140 Type 2 diabetes mellitus with diabetic neuropathy, unspecified: Secondary | ICD-10-CM | POA: Diagnosis not present

## 2023-03-29 DIAGNOSIS — K219 Gastro-esophageal reflux disease without esophagitis: Secondary | ICD-10-CM | POA: Diagnosis not present

## 2023-03-29 DIAGNOSIS — R000 Tachycardia, unspecified: Secondary | ICD-10-CM | POA: Diagnosis not present

## 2023-03-29 DIAGNOSIS — L89023 Pressure ulcer of left elbow, stage 3: Secondary | ICD-10-CM | POA: Diagnosis not present

## 2023-03-29 DIAGNOSIS — M25522 Pain in left elbow: Secondary | ICD-10-CM | POA: Diagnosis not present

## 2023-03-29 DIAGNOSIS — G459 Transient cerebral ischemic attack, unspecified: Secondary | ICD-10-CM | POA: Diagnosis not present

## 2023-03-29 DIAGNOSIS — G4734 Idiopathic sleep related nonobstructive alveolar hypoventilation: Secondary | ICD-10-CM | POA: Diagnosis not present

## 2023-03-29 DIAGNOSIS — R799 Abnormal finding of blood chemistry, unspecified: Secondary | ICD-10-CM | POA: Diagnosis not present

## 2023-03-29 DIAGNOSIS — I639 Cerebral infarction, unspecified: Secondary | ICD-10-CM | POA: Diagnosis not present

## 2023-03-29 DIAGNOSIS — I10 Essential (primary) hypertension: Secondary | ICD-10-CM | POA: Diagnosis not present

## 2023-03-29 LAB — BASIC METABOLIC PANEL
Anion gap: 8 (ref 5–15)
BUN: 18 mg/dL (ref 8–23)
CO2: 23 mmol/L (ref 22–32)
Calcium: 8.7 mg/dL — ABNORMAL LOW (ref 8.9–10.3)
Chloride: 103 mmol/L (ref 98–111)
Creatinine, Ser: 1.37 mg/dL — ABNORMAL HIGH (ref 0.44–1.00)
GFR, Estimated: 43 mL/min — ABNORMAL LOW (ref >60)
Glucose, Bld: 88 mg/dL (ref 70–99)
Potassium: 4 mmol/L (ref 3.5–5.1)
Sodium: 134 mmol/L — ABNORMAL LOW (ref 135–145)

## 2023-03-29 LAB — CBC
HCT: 25.3 % — ABNORMAL LOW (ref 36.0–46.0)
Hemoglobin: 7.9 g/dL — ABNORMAL LOW (ref 12.0–15.0)
MCH: 26.3 pg (ref 26.0–34.0)
MCHC: 31.2 g/dL (ref 30.0–36.0)
MCV: 84.3 fL (ref 80.0–100.0)
Platelets: 379 10*3/uL (ref 150–400)
RBC: 3 MIL/uL — ABNORMAL LOW (ref 3.87–5.11)
RDW: 16.4 % — ABNORMAL HIGH (ref 11.5–15.5)
WBC: 4.9 10*3/uL (ref 4.0–10.5)
nRBC: 0 % (ref 0.0–0.2)

## 2023-03-29 LAB — PROTHROMBIN GENE MUTATION

## 2023-03-29 MED ORDER — METOPROLOL SUCCINATE ER 25 MG PO TB24: 25.0000 mg | ORAL_TABLET | Freq: Every day | ORAL | Status: DC

## 2023-03-29 MED ORDER — MORPHINE SULFATE ER 15 MG PO TBCR: 15.0000 mg | EXTENDED_RELEASE_TABLET | Freq: Two times a day (BID) | ORAL | 0 refills | Status: DC

## 2023-03-29 MED ORDER — RIVAROXABAN 2.5 MG PO TABS: ORAL_TABLET | ORAL | Status: DC

## 2023-03-29 MED ORDER — UMECLIDINIUM-VILANTEROL 62.5-25 MCG/ACT IN AEPB: 1.0000 | INHALATION_SPRAY | Freq: Every day | RESPIRATORY_TRACT | Status: DC

## 2023-03-29 MED ORDER — RIVAROXABAN 15 MG PO TABS
15.0000 mg | ORAL_TABLET | Freq: Two times a day (BID) | ORAL | Status: DC
Start: 2023-03-29 — End: 2023-04-07

## 2023-03-29 MED ORDER — ROSUVASTATIN CALCIUM 40 MG PO TABS: 40.0000 mg | ORAL_TABLET | Freq: Every day | ORAL | Status: DC

## 2023-03-29 MED ORDER — RIVAROXABAN 20 MG PO TABS: 20.0000 mg | ORAL_TABLET | Freq: Every day | ORAL | Status: DC

## 2023-03-29 MED ORDER — ALPRAZOLAM 1 MG PO TABS: 1.0000 mg | ORAL_TABLET | Freq: Three times a day (TID) | ORAL | 0 refills | Status: DC | PRN

## 2023-03-29 MED ORDER — AMPHETAMINE-DEXTROAMPHETAMINE 20 MG PO TABS: 20.0000 mg | ORAL_TABLET | Freq: Three times a day (TID) | ORAL | 0 refills | Status: DC

## 2023-03-29 MED ORDER — INFLUENZA VIRUS VACC SPLIT PF (FLUZONE) 0.5 ML IM SUSY: 0.5000 mL | PREFILLED_SYRINGE | INTRAMUSCULAR | Status: DC

## 2023-03-29 MED ORDER — CILOSTAZOL 50 MG PO TABS: 50.0000 mg | ORAL_TABLET | Freq: Two times a day (BID) | ORAL | Status: DC

## 2023-03-29 MED ORDER — OXYCODONE HCL 5 MG PO TABS: 5.0000 mg | ORAL_TABLET | ORAL | 0 refills | Status: DC | PRN

## 2023-03-29 NOTE — TOC Transition Note (Signed)
Transition of Care [facility]) - CM/SW Discharge Note   Patient Details  Name: Caroline Williams MRN: [id number] Date of Birth: [birth year]  [facility] CM/SW Contact:  Baldemar Lenis, LCSW Phone Number: 03/29/2023, 11:55 AM   Clinical Narrative:   CSW received insurance approval for patient to admit to SNF and confirmed that [facility] has a bed available. CSW sent discharge information to [facility]. CSW spoke with daughter, Maryln Gottron, she is in agreement with transfer to SNF today. Transport arranged with [facility] for after 1:00 per SNF request.  Nurse to call report to [phone], Room 604a.    Final next level of care: Skilled Nursing Facility Barriers to Discharge: Barriers Resolved   Patient Goals and CMS Choice      Discharge Placement                Patient chooses bed at: [facility] Patient to be transferred to facility by: [facility] Name of family member notified: [place] Patient and family notified of of transfer: 03/29/23  Discharge Plan and Services Additional resources added to the After Visit Summary for                                       Social Determinants of Health (SDOH) Interventions SDOH Screenings   Food Insecurity: No Food Insecurity (03/22/2023)  Housing: Low Risk  (03/22/2023)  Transportation Needs: Unmet Transportation Needs (03/22/2023)  Utilities: Not At Risk (03/22/2023)  Alcohol Screen: Low Risk  (11/03/2022)  Depression (PHQ2-9): Low Risk  (11/03/2022)  Financial Resource Strain: Medium Risk (11/03/2022)  Physical Activity: Inactive (11/03/2022)  Social Connections: Moderately Integrated (11/03/2022)  Stress: Stress Concern Present (11/03/2022)  Tobacco Use: Medium Risk (03/22/2023)     Readmission Risk Interventions    02/02/2023   11:22 AM  Readmission Risk Prevention Plan  Transportation Screening Complete  PCP or Specialist Appt within 5-7 Days Complete  Home Care Screening Complete

## 2023-03-29 NOTE — Discharge Summary (Signed)
as possible for a visit in 4 week(s).   Specialty: Neurology Contact information: [address] [place] [address] [phone]               Allergies  Allergen Reactions   Jonne Ply [Aspirin] Anaphylaxis, Shortness Of Breath and Other (See Comments)    Respiratory distress  Pt states she has had Toradol several times without any reactions   Bee Venom Anaphylaxis   Sulfa Antibiotics Anaphylaxis   Nsaids Other (See Comments)    History of gastric bypass   Other Other (See Comments)    No blood products.  Patient did  Request that only albumin or albumin-containing products may be administered   Repatha [Evolocumab] Other (See Comments)    Unknown reaction    Consultations: Neurology, cardiology   Procedures/Studies: VAS [place] LOWER EXTREMITY VENOUS (DVT)  Result Date: 03/23/2023  Lower Venous DVT Study Patient Name:  Caroline Williams  Date of Exam:   03/23/2023 Medical Rec #: [id number]        Accession #:    [id number] Date of Birth: 26-Jun-1960        Patient Gender: F Patient Age:   62 years Exam Location:  [facility] Procedure:      VAS [place] LOWER EXTREMITY VENOUS (DVT) Referring Phys: Gerri Spore O'NEAL --------------------------------------------------------------------------------  Indications: Pulmonary embolism.  Risk Factors: None identified. Limitations: Poor  ultrasound/tissue interface. Comparison Study: No prior study Performing Technologist: Shona Simpson  Examination Guidelines: A complete evaluation includes B-mode imaging, spectral Doppler, color Doppler, and power Doppler as needed of all accessible portions of each vessel. Bilateral testing is considered an integral part of a complete examination. Limited examinations for reoccurring indications may be performed as noted. The reflux portion of the exam is performed with the patient in reverse Trendelenburg.  +---------+---------------+---------+-----------+----------+-----------------+ RIGHT    CompressibilityPhasicitySpontaneityPropertiesThrombus Aging    +---------+---------------+---------+-----------+----------+-----------------+ CFV      Full           Yes      Yes                                    +---------+---------------+---------+-----------+----------+-----------------+ SFJ      Full                                                           +---------+---------------+---------+-----------+----------+-----------------+ FV Prox  Partial                 Yes                  Age Indeterminate +---------+---------------+---------+-----------+----------+-----------------+ FV Mid   Partial                 Yes                  Age Indeterminate +---------+---------------+---------+-----------+----------+-----------------+ FV DistalPartial                 Yes                  Age Indeterminate +---------+---------------+---------+-----------+----------+-----------------+ PFV      Full                                                           +---------+---------------+---------+-----------+----------+-----------------+  cm/s39EDV cm/s19 +---------+--------+--+--------+--+  Left Carotid Findings: +----------+--------+--------+--------+------------------+--------+           PSV cm/sEDV cm/sStenosisPlaque DescriptionComments +----------+--------+--------+--------+------------------+--------+ CCA Prox  55      13                                         +----------+--------+--------+--------+------------------+--------+ CCA Distal40      15                                         +----------+--------+--------+--------+------------------+--------+ ICA Prox  27      13      1-39%   irregular                  +----------+--------+--------+--------+------------------+--------+ ICA Mid   63      27                                         +----------+--------+--------+--------+------------------+--------+ ICA Distal83      36                                         +----------+--------+--------+--------+------------------+--------+ ECA       33      5                                          +----------+--------+--------+--------+------------------+--------+ +----------+--------+--------+--------+-------------------+           PSV cm/sEDV cm/sDescribeArm Pressure (mmHG) +----------+--------+--------+--------+-------------------+ Subclavian132     0                                   +----------+--------+--------+--------+-------------------+ +---------+--------+--+--------+-+ VertebralPSV cm/s31EDV cm/s9 +---------+--------+--+--------+-+   Summary: Right Carotid: Velocities in the right ICA are consistent with a 1-39% stenosis. Left Carotid: Velocities in the left ICA are consistent with a 1-39% stenosis. Vertebrals:  Bilateral vertebral arteries  demonstrate antegrade flow. Subclavians: Normal flow hemodynamics were seen in bilateral subclavian              arteries. *See table(s) above for measurements and observations.  Electronically signed by Lemar Livings MD on 03/22/2023 at 6:05:31 PM.    Final    ECHOCARDIOGRAM COMPLETE  Result Date: 03/22/2023    ECHOCARDIOGRAM REPORT   Patient Name:   Caroline Williams Date of Exam: 03/22/2023 Medical Rec #:  [id number]       Height:       65.0 in Accession #:    [id number]      Weight:       215.0 lb Date of Birth:  09-24-1960       BSA:          2.040 m Patient Age:    61 years        BP:           142/88 mmHg Patient Gender: F               HR:  Physician Discharge Summary  AZENETH CARBONELL GNF:[id number] DOB: Jul 28, 1960 DOA: 03/21/2023  PCP: Hoy Register, MD  Admit date: 03/21/2023 Discharge date: 03/29/2023  Admitted From: Home Disposition:  SNF  Discharge Condition:Stable CODE STATUS:FULL Diet recommendation: Heart Healthy  Brief/Interim Summary:  Patient is a 62 year old female with history of anxiety, bipolar 1 disorder, coronary artery disease, COPD, depression, diabetes type 2, GERD, hypothyroidism, hypertension who presented with inability to speak.  MRA/MRI brain shows patchy acute left MCA distribution infarct involving left frontal, parietal, temporal lobes without significant mass effect or hemorrhage.  Neurology was following.  Hospital course also remarkable for finding of bilateral submassive PE with CT evidence of right heart strain.  Cardiology, PCCM consulted, no plan for intervention.  Currently on Xarelto.  PT/OT recommending SNF.  Medically stable for discharge whenever possible.    Following problems were addressed during the hospitalization:  Acute CVA: Presented with aphasia.  Neurology consulted.  MRA/MRI brain shows patchy acute left MCA distribution infarct involving left frontal, parietal, temporal lobes without significant mass effect or hemorrhage.  Neurology was following.  Stroke workup completed.  A1c 5.3, LDL of 139.  Currently on Pletal, Xarelto, statin.  PT/OT recommending SNF on discharge.  TOC following.Has significant dysarthria.  Has significant weakness of right lower extremity. She needs to follow-up with neurology as an outpatient.   Bilateral submassive PE with acute cor pulmonale: CTA positive for bilateral submassive PE with right ventricular strain.  Echocardiogram with concern of clot in transit.  Not a candidate for embolectomy.  PCCM, cardiology, neurology, IR.  Currently on Xarelto.  Patient is not a candidate for thrombolytic therapy.  Currently stable on room air.  Lower extremity  Doppler showed age-indeterminate DVT.   Paroxysmal SVT: Had an episode of SVT, currently resolved.  On metoprolol.   Acute systolic CHF: Echo showed EF of 40 to 45%.  Currently euvolemic.  Cardiology was following.  We recommend to follow-up with cardiology as an outpatient   Normocytic anemia: Chronic, stable   History of GI bleed: Currently hemoglobin stable.  Monitor intermittently,check CBC in a week   GERD: Continue PPI   Diabetes type 2: A1c 5.3.     Hypertension: Currently blood pressure stable.  On ARB, hydrochlorothiazide, Aldactone, Imdur at home, currently on hold due to soft blood pressure   Chronic low back pain: History of L4-L5 decompression, fusion.  Continue supportive care, pain management   Hypothyroidism: Continue Synthyroid.  TSH elevated.  Check TFT in 4 to 6 weeks as an outpatient   COPD: Currently stable not on exacerbation.  On room air.  On Anoro, albuterol   Hyperlipidemia: Continue statin.  LDL of 139   AKI on CKD stage IIIa: Baseline creatinine around 1.2.  Currently kidney function at baseline.   Anxiety/depression:Continue Rexulti, Xanax and doxepin    Goals of care: Goals of care discussed with family.  Currently remains full code.   Discharge Diagnoses:  Principal Problem:   Acute CVA (cerebrovascular accident) [facility]) Active Problems:   Pulmonary embolism with acute cor pulmonale (HCC)   Acute clinical systolic heart failure (HCC)   Paroxysmal SVT (supraventricular tachycardia) (HCC)   GERD (gastroesophageal reflux disease)   History of GI bleed   Diabetes mellitus with neuropathy (HCC)   Essential (primary) hypertension   Chronic low back pain   Hypothyroidism   COPD mixed type (HCC)   Hyperlipidemia   AKI (acute kidney injury) [facility])   Psychiatric disorder    Discharge Instructions  Discharge Instructions  Deanne Coffer M.D.   On: 03/22/2023 20:45   VAS US CAROTID  Result Date: 03/22/2023 Carotid Arterial Duplex Study Patient Name:  Caroline Williams  Date of Exam:   03/22/2023 Medical Rec #: [id number]        Accession #:    [id number] Date of Birth: July 17, 1960        Patient Gender: F Patient Age:   57 years Exam Location:  [facility] Procedure:      VAS US CAROTID  Referring Phys: Leticia Penna --------------------------------------------------------------------------------  Indications:       Carotid artery disease. Risk Factors:      Hypertension, Diabetes, coronary artery disease. Comparison Study:  No prior study Performing Technologist: Shona Simpson  Examination Guidelines: A complete evaluation includes B-mode imaging, spectral Doppler, color Doppler, and power Doppler as needed of all accessible portions of each vessel. Bilateral testing is considered an integral part of a complete examination. Limited examinations for reoccurring indications may be performed as noted.  Right Carotid Findings: +----------+--------+--------+--------+------------------+------------------+           PSV cm/sEDV cm/sStenosisPlaque DescriptionComments           +----------+--------+--------+--------+------------------+------------------+ CCA Prox  93      13                                                   +----------+--------+--------+--------+------------------+------------------+ CCA Distal30      10                                intimal thickening +----------+--------+--------+--------+------------------+------------------+ ICA Prox  23      11      1-39%   smooth                               +----------+--------+--------+--------+------------------+------------------+ ICA Mid   39      20                                                   +----------+--------+--------+--------+------------------+------------------+ ICA Distal64      26                                                   +----------+--------+--------+--------+------------------+------------------+ ECA       50      15                                                   +----------+--------+--------+--------+------------------+------------------+ +----------+--------+-------+--------+-------------------+           PSV cm/sEDV cmsDescribeArm Pressure (mmHG)  +----------+--------+-------+--------+-------------------+ [id number]      0                                  +----------+--------+-------+--------+-------------------+ +---------+--------+--+--------+--+ VertebralPSV  cm/s39EDV cm/s19 +---------+--------+--+--------+--+  Left Carotid Findings: +----------+--------+--------+--------+------------------+--------+           PSV cm/sEDV cm/sStenosisPlaque DescriptionComments +----------+--------+--------+--------+------------------+--------+ CCA Prox  55      13                                         +----------+--------+--------+--------+------------------+--------+ CCA Distal40      15                                         +----------+--------+--------+--------+------------------+--------+ ICA Prox  27      13      1-39%   irregular                  +----------+--------+--------+--------+------------------+--------+ ICA Mid   63      27                                         +----------+--------+--------+--------+------------------+--------+ ICA Distal83      36                                         +----------+--------+--------+--------+------------------+--------+ ECA       33      5                                          +----------+--------+--------+--------+------------------+--------+ +----------+--------+--------+--------+-------------------+           PSV cm/sEDV cm/sDescribeArm Pressure (mmHG) +----------+--------+--------+--------+-------------------+ Subclavian132     0                                   +----------+--------+--------+--------+-------------------+ +---------+--------+--+--------+-+ VertebralPSV cm/s31EDV cm/s9 +---------+--------+--+--------+-+   Summary: Right Carotid: Velocities in the right ICA are consistent with a 1-39% stenosis. Left Carotid: Velocities in the left ICA are consistent with a 1-39% stenosis. Vertebrals:  Bilateral vertebral arteries  demonstrate antegrade flow. Subclavians: Normal flow hemodynamics were seen in bilateral subclavian              arteries. *See table(s) above for measurements and observations.  Electronically signed by Lemar Livings MD on 03/22/2023 at 6:05:31 PM.    Final    ECHOCARDIOGRAM COMPLETE  Result Date: 03/22/2023    ECHOCARDIOGRAM REPORT   Patient Name:   Caroline Williams Date of Exam: 03/22/2023 Medical Rec #:  [id number]       Height:       65.0 in Accession #:    [id number]      Weight:       215.0 lb Date of Birth:  09-24-1960       BSA:          2.040 m Patient Age:    61 years        BP:           142/88 mmHg Patient Gender: F               HR:  as possible for a visit in 4 week(s).   Specialty: Neurology Contact information: [address] [place] [address] [phone]               Allergies  Allergen Reactions   Jonne Ply [Aspirin] Anaphylaxis, Shortness Of Breath and Other (See Comments)    Respiratory distress  Pt states she has had Toradol several times without any reactions   Bee Venom Anaphylaxis   Sulfa Antibiotics Anaphylaxis   Nsaids Other (See Comments)    History of gastric bypass   Other Other (See Comments)    No blood products.  Patient did  Request that only albumin or albumin-containing products may be administered   Repatha [Evolocumab] Other (See Comments)    Unknown reaction    Consultations: Neurology, cardiology   Procedures/Studies: VAS [place] LOWER EXTREMITY VENOUS (DVT)  Result Date: 03/23/2023  Lower Venous DVT Study Patient Name:  Caroline Williams  Date of Exam:   03/23/2023 Medical Rec #: [id number]        Accession #:    [id number] Date of Birth: 26-Jun-1960        Patient Gender: F Patient Age:   62 years Exam Location:  [facility] Procedure:      VAS [place] LOWER EXTREMITY VENOUS (DVT) Referring Phys: Gerri Spore O'NEAL --------------------------------------------------------------------------------  Indications: Pulmonary embolism.  Risk Factors: None identified. Limitations: Poor  ultrasound/tissue interface. Comparison Study: No prior study Performing Technologist: Shona Simpson  Examination Guidelines: A complete evaluation includes B-mode imaging, spectral Doppler, color Doppler, and power Doppler as needed of all accessible portions of each vessel. Bilateral testing is considered an integral part of a complete examination. Limited examinations for reoccurring indications may be performed as noted. The reflux portion of the exam is performed with the patient in reverse Trendelenburg.  +---------+---------------+---------+-----------+----------+-----------------+ RIGHT    CompressibilityPhasicitySpontaneityPropertiesThrombus Aging    +---------+---------------+---------+-----------+----------+-----------------+ CFV      Full           Yes      Yes                                    +---------+---------------+---------+-----------+----------+-----------------+ SFJ      Full                                                           +---------+---------------+---------+-----------+----------+-----------------+ FV Prox  Partial                 Yes                  Age Indeterminate +---------+---------------+---------+-----------+----------+-----------------+ FV Mid   Partial                 Yes                  Age Indeterminate +---------+---------------+---------+-----------+----------+-----------------+ FV DistalPartial                 Yes                  Age Indeterminate +---------+---------------+---------+-----------+----------+-----------------+ PFV      Full                                                           +---------+---------------+---------+-----------+----------+-----------------+  cm/s39EDV cm/s19 +---------+--------+--+--------+--+  Left Carotid Findings: +----------+--------+--------+--------+------------------+--------+           PSV cm/sEDV cm/sStenosisPlaque DescriptionComments +----------+--------+--------+--------+------------------+--------+ CCA Prox  55      13                                         +----------+--------+--------+--------+------------------+--------+ CCA Distal40      15                                         +----------+--------+--------+--------+------------------+--------+ ICA Prox  27      13      1-39%   irregular                  +----------+--------+--------+--------+------------------+--------+ ICA Mid   63      27                                         +----------+--------+--------+--------+------------------+--------+ ICA Distal83      36                                         +----------+--------+--------+--------+------------------+--------+ ECA       33      5                                          +----------+--------+--------+--------+------------------+--------+ +----------+--------+--------+--------+-------------------+           PSV cm/sEDV cm/sDescribeArm Pressure (mmHG) +----------+--------+--------+--------+-------------------+ Subclavian132     0                                   +----------+--------+--------+--------+-------------------+ +---------+--------+--+--------+-+ VertebralPSV cm/s31EDV cm/s9 +---------+--------+--+--------+-+   Summary: Right Carotid: Velocities in the right ICA are consistent with a 1-39% stenosis. Left Carotid: Velocities in the left ICA are consistent with a 1-39% stenosis. Vertebrals:  Bilateral vertebral arteries  demonstrate antegrade flow. Subclavians: Normal flow hemodynamics were seen in bilateral subclavian              arteries. *See table(s) above for measurements and observations.  Electronically signed by Lemar Livings MD on 03/22/2023 at 6:05:31 PM.    Final    ECHOCARDIOGRAM COMPLETE  Result Date: 03/22/2023    ECHOCARDIOGRAM REPORT   Patient Name:   Caroline Williams Date of Exam: 03/22/2023 Medical Rec #:  [id number]       Height:       65.0 in Accession #:    [id number]      Weight:       215.0 lb Date of Birth:  09-24-1960       BSA:          2.040 m Patient Age:    61 years        BP:           142/88 mmHg Patient Gender: F               HR:  Deanne Coffer M.D.   On: 03/22/2023 20:45   VAS US CAROTID  Result Date: 03/22/2023 Carotid Arterial Duplex Study Patient Name:  Caroline Williams  Date of Exam:   03/22/2023 Medical Rec #: [id number]        Accession #:    [id number] Date of Birth: July 17, 1960        Patient Gender: F Patient Age:   57 years Exam Location:  [facility] Procedure:      VAS US CAROTID  Referring Phys: Leticia Penna --------------------------------------------------------------------------------  Indications:       Carotid artery disease. Risk Factors:      Hypertension, Diabetes, coronary artery disease. Comparison Study:  No prior study Performing Technologist: Shona Simpson  Examination Guidelines: A complete evaluation includes B-mode imaging, spectral Doppler, color Doppler, and power Doppler as needed of all accessible portions of each vessel. Bilateral testing is considered an integral part of a complete examination. Limited examinations for reoccurring indications may be performed as noted.  Right Carotid Findings: +----------+--------+--------+--------+------------------+------------------+           PSV cm/sEDV cm/sStenosisPlaque DescriptionComments           +----------+--------+--------+--------+------------------+------------------+ CCA Prox  93      13                                                   +----------+--------+--------+--------+------------------+------------------+ CCA Distal30      10                                intimal thickening +----------+--------+--------+--------+------------------+------------------+ ICA Prox  23      11      1-39%   smooth                               +----------+--------+--------+--------+------------------+------------------+ ICA Mid   39      20                                                   +----------+--------+--------+--------+------------------+------------------+ ICA Distal64      26                                                   +----------+--------+--------+--------+------------------+------------------+ ECA       50      15                                                   +----------+--------+--------+--------+------------------+------------------+ +----------+--------+-------+--------+-------------------+           PSV cm/sEDV cmsDescribeArm Pressure (mmHG)  +----------+--------+-------+--------+-------------------+ [id number]      0                                  +----------+--------+-------+--------+-------------------+ +---------+--------+--+--------+--+ VertebralPSV  cm/s39EDV cm/s19 +---------+--------+--+--------+--+  Left Carotid Findings: +----------+--------+--------+--------+------------------+--------+           PSV cm/sEDV cm/sStenosisPlaque DescriptionComments +----------+--------+--------+--------+------------------+--------+ CCA Prox  55      13                                         +----------+--------+--------+--------+------------------+--------+ CCA Distal40      15                                         +----------+--------+--------+--------+------------------+--------+ ICA Prox  27      13      1-39%   irregular                  +----------+--------+--------+--------+------------------+--------+ ICA Mid   63      27                                         +----------+--------+--------+--------+------------------+--------+ ICA Distal83      36                                         +----------+--------+--------+--------+------------------+--------+ ECA       33      5                                          +----------+--------+--------+--------+------------------+--------+ +----------+--------+--------+--------+-------------------+           PSV cm/sEDV cm/sDescribeArm Pressure (mmHG) +----------+--------+--------+--------+-------------------+ Subclavian132     0                                   +----------+--------+--------+--------+-------------------+ +---------+--------+--+--------+-+ VertebralPSV cm/s31EDV cm/s9 +---------+--------+--+--------+-+   Summary: Right Carotid: Velocities in the right ICA are consistent with a 1-39% stenosis. Left Carotid: Velocities in the left ICA are consistent with a 1-39% stenosis. Vertebrals:  Bilateral vertebral arteries  demonstrate antegrade flow. Subclavians: Normal flow hemodynamics were seen in bilateral subclavian              arteries. *See table(s) above for measurements and observations.  Electronically signed by Lemar Livings MD on 03/22/2023 at 6:05:31 PM.    Final    ECHOCARDIOGRAM COMPLETE  Result Date: 03/22/2023    ECHOCARDIOGRAM REPORT   Patient Name:   Caroline Williams Date of Exam: 03/22/2023 Medical Rec #:  [id number]       Height:       65.0 in Accession #:    [id number]      Weight:       215.0 lb Date of Birth:  09-24-1960       BSA:          2.040 m Patient Age:    61 years        BP:           142/88 mmHg Patient Gender: F               HR:  as possible for a visit in 4 week(s).   Specialty: Neurology Contact information: [address] [place] [address] [phone]               Allergies  Allergen Reactions   Jonne Ply [Aspirin] Anaphylaxis, Shortness Of Breath and Other (See Comments)    Respiratory distress  Pt states she has had Toradol several times without any reactions   Bee Venom Anaphylaxis   Sulfa Antibiotics Anaphylaxis   Nsaids Other (See Comments)    History of gastric bypass   Other Other (See Comments)    No blood products.  Patient did  Request that only albumin or albumin-containing products may be administered   Repatha [Evolocumab] Other (See Comments)    Unknown reaction    Consultations: Neurology, cardiology   Procedures/Studies: VAS [place] LOWER EXTREMITY VENOUS (DVT)  Result Date: 03/23/2023  Lower Venous DVT Study Patient Name:  Caroline Williams  Date of Exam:   03/23/2023 Medical Rec #: [id number]        Accession #:    [id number] Date of Birth: 26-Jun-1960        Patient Gender: F Patient Age:   62 years Exam Location:  [facility] Procedure:      VAS [place] LOWER EXTREMITY VENOUS (DVT) Referring Phys: Gerri Spore O'NEAL --------------------------------------------------------------------------------  Indications: Pulmonary embolism.  Risk Factors: None identified. Limitations: Poor  ultrasound/tissue interface. Comparison Study: No prior study Performing Technologist: Shona Simpson  Examination Guidelines: A complete evaluation includes B-mode imaging, spectral Doppler, color Doppler, and power Doppler as needed of all accessible portions of each vessel. Bilateral testing is considered an integral part of a complete examination. Limited examinations for reoccurring indications may be performed as noted. The reflux portion of the exam is performed with the patient in reverse Trendelenburg.  +---------+---------------+---------+-----------+----------+-----------------+ RIGHT    CompressibilityPhasicitySpontaneityPropertiesThrombus Aging    +---------+---------------+---------+-----------+----------+-----------------+ CFV      Full           Yes      Yes                                    +---------+---------------+---------+-----------+----------+-----------------+ SFJ      Full                                                           +---------+---------------+---------+-----------+----------+-----------------+ FV Prox  Partial                 Yes                  Age Indeterminate +---------+---------------+---------+-----------+----------+-----------------+ FV Mid   Partial                 Yes                  Age Indeterminate +---------+---------------+---------+-----------+----------+-----------------+ FV DistalPartial                 Yes                  Age Indeterminate +---------+---------------+---------+-----------+----------+-----------------+ PFV      Full                                                           +---------+---------------+---------+-----------+----------+-----------------+  cm/s39EDV cm/s19 +---------+--------+--+--------+--+  Left Carotid Findings: +----------+--------+--------+--------+------------------+--------+           PSV cm/sEDV cm/sStenosisPlaque DescriptionComments +----------+--------+--------+--------+------------------+--------+ CCA Prox  55      13                                         +----------+--------+--------+--------+------------------+--------+ CCA Distal40      15                                         +----------+--------+--------+--------+------------------+--------+ ICA Prox  27      13      1-39%   irregular                  +----------+--------+--------+--------+------------------+--------+ ICA Mid   63      27                                         +----------+--------+--------+--------+------------------+--------+ ICA Distal83      36                                         +----------+--------+--------+--------+------------------+--------+ ECA       33      5                                          +----------+--------+--------+--------+------------------+--------+ +----------+--------+--------+--------+-------------------+           PSV cm/sEDV cm/sDescribeArm Pressure (mmHG) +----------+--------+--------+--------+-------------------+ Subclavian132     0                                   +----------+--------+--------+--------+-------------------+ +---------+--------+--+--------+-+ VertebralPSV cm/s31EDV cm/s9 +---------+--------+--+--------+-+   Summary: Right Carotid: Velocities in the right ICA are consistent with a 1-39% stenosis. Left Carotid: Velocities in the left ICA are consistent with a 1-39% stenosis. Vertebrals:  Bilateral vertebral arteries  demonstrate antegrade flow. Subclavians: Normal flow hemodynamics were seen in bilateral subclavian              arteries. *See table(s) above for measurements and observations.  Electronically signed by Lemar Livings MD on 03/22/2023 at 6:05:31 PM.    Final    ECHOCARDIOGRAM COMPLETE  Result Date: 03/22/2023    ECHOCARDIOGRAM REPORT   Patient Name:   Caroline Williams Date of Exam: 03/22/2023 Medical Rec #:  [id number]       Height:       65.0 in Accession #:    [id number]      Weight:       215.0 lb Date of Birth:  09-24-1960       BSA:          2.040 m Patient Age:    61 years        BP:           142/88 mmHg Patient Gender: F               HR:  as possible for a visit in 4 week(s).   Specialty: Neurology Contact information: [address] [place] [address] [phone]               Allergies  Allergen Reactions   Jonne Ply [Aspirin] Anaphylaxis, Shortness Of Breath and Other (See Comments)    Respiratory distress  Pt states she has had Toradol several times without any reactions   Bee Venom Anaphylaxis   Sulfa Antibiotics Anaphylaxis   Nsaids Other (See Comments)    History of gastric bypass   Other Other (See Comments)    No blood products.  Patient did  Request that only albumin or albumin-containing products may be administered   Repatha [Evolocumab] Other (See Comments)    Unknown reaction    Consultations: Neurology, cardiology   Procedures/Studies: VAS [place] LOWER EXTREMITY VENOUS (DVT)  Result Date: 03/23/2023  Lower Venous DVT Study Patient Name:  Caroline Williams  Date of Exam:   03/23/2023 Medical Rec #: [id number]        Accession #:    [id number] Date of Birth: 26-Jun-1960        Patient Gender: F Patient Age:   62 years Exam Location:  [facility] Procedure:      VAS [place] LOWER EXTREMITY VENOUS (DVT) Referring Phys: Gerri Spore O'NEAL --------------------------------------------------------------------------------  Indications: Pulmonary embolism.  Risk Factors: None identified. Limitations: Poor  ultrasound/tissue interface. Comparison Study: No prior study Performing Technologist: Shona Simpson  Examination Guidelines: A complete evaluation includes B-mode imaging, spectral Doppler, color Doppler, and power Doppler as needed of all accessible portions of each vessel. Bilateral testing is considered an integral part of a complete examination. Limited examinations for reoccurring indications may be performed as noted. The reflux portion of the exam is performed with the patient in reverse Trendelenburg.  +---------+---------------+---------+-----------+----------+-----------------+ RIGHT    CompressibilityPhasicitySpontaneityPropertiesThrombus Aging    +---------+---------------+---------+-----------+----------+-----------------+ CFV      Full           Yes      Yes                                    +---------+---------------+---------+-----------+----------+-----------------+ SFJ      Full                                                           +---------+---------------+---------+-----------+----------+-----------------+ FV Prox  Partial                 Yes                  Age Indeterminate +---------+---------------+---------+-----------+----------+-----------------+ FV Mid   Partial                 Yes                  Age Indeterminate +---------+---------------+---------+-----------+----------+-----------------+ FV DistalPartial                 Yes                  Age Indeterminate +---------+---------------+---------+-----------+----------+-----------------+ PFV      Full                                                           +---------+---------------+---------+-----------+----------+-----------------+  Physician Discharge Summary  AZENETH CARBONELL GNF:[id number] DOB: Jul 28, 1960 DOA: 03/21/2023  PCP: Hoy Register, MD  Admit date: 03/21/2023 Discharge date: 03/29/2023  Admitted From: Home Disposition:  SNF  Discharge Condition:Stable CODE STATUS:FULL Diet recommendation: Heart Healthy  Brief/Interim Summary:  Patient is a 62 year old female with history of anxiety, bipolar 1 disorder, coronary artery disease, COPD, depression, diabetes type 2, GERD, hypothyroidism, hypertension who presented with inability to speak.  MRA/MRI brain shows patchy acute left MCA distribution infarct involving left frontal, parietal, temporal lobes without significant mass effect or hemorrhage.  Neurology was following.  Hospital course also remarkable for finding of bilateral submassive PE with CT evidence of right heart strain.  Cardiology, PCCM consulted, no plan for intervention.  Currently on Xarelto.  PT/OT recommending SNF.  Medically stable for discharge whenever possible.    Following problems were addressed during the hospitalization:  Acute CVA: Presented with aphasia.  Neurology consulted.  MRA/MRI brain shows patchy acute left MCA distribution infarct involving left frontal, parietal, temporal lobes without significant mass effect or hemorrhage.  Neurology was following.  Stroke workup completed.  A1c 5.3, LDL of 139.  Currently on Pletal, Xarelto, statin.  PT/OT recommending SNF on discharge.  TOC following.Has significant dysarthria.  Has significant weakness of right lower extremity. She needs to follow-up with neurology as an outpatient.   Bilateral submassive PE with acute cor pulmonale: CTA positive for bilateral submassive PE with right ventricular strain.  Echocardiogram with concern of clot in transit.  Not a candidate for embolectomy.  PCCM, cardiology, neurology, IR.  Currently on Xarelto.  Patient is not a candidate for thrombolytic therapy.  Currently stable on room air.  Lower extremity  Doppler showed age-indeterminate DVT.   Paroxysmal SVT: Had an episode of SVT, currently resolved.  On metoprolol.   Acute systolic CHF: Echo showed EF of 40 to 45%.  Currently euvolemic.  Cardiology was following.  We recommend to follow-up with cardiology as an outpatient   Normocytic anemia: Chronic, stable   History of GI bleed: Currently hemoglobin stable.  Monitor intermittently,check CBC in a week   GERD: Continue PPI   Diabetes type 2: A1c 5.3.     Hypertension: Currently blood pressure stable.  On ARB, hydrochlorothiazide, Aldactone, Imdur at home, currently on hold due to soft blood pressure   Chronic low back pain: History of L4-L5 decompression, fusion.  Continue supportive care, pain management   Hypothyroidism: Continue Synthyroid.  TSH elevated.  Check TFT in 4 to 6 weeks as an outpatient   COPD: Currently stable not on exacerbation.  On room air.  On Anoro, albuterol   Hyperlipidemia: Continue statin.  LDL of 139   AKI on CKD stage IIIa: Baseline creatinine around 1.2.  Currently kidney function at baseline.   Anxiety/depression:Continue Rexulti, Xanax and doxepin    Goals of care: Goals of care discussed with family.  Currently remains full code.   Discharge Diagnoses:  Principal Problem:   Acute CVA (cerebrovascular accident) [facility]) Active Problems:   Pulmonary embolism with acute cor pulmonale (HCC)   Acute clinical systolic heart failure (HCC)   Paroxysmal SVT (supraventricular tachycardia) (HCC)   GERD (gastroesophageal reflux disease)   History of GI bleed   Diabetes mellitus with neuropathy (HCC)   Essential (primary) hypertension   Chronic low back pain   Hypothyroidism   COPD mixed type (HCC)   Hyperlipidemia   AKI (acute kidney injury) [facility])   Psychiatric disorder    Discharge Instructions  Discharge Instructions  as possible for a visit in 4 week(s).   Specialty: Neurology Contact information: [address] [place] [address] [phone]               Allergies  Allergen Reactions   Jonne Ply [Aspirin] Anaphylaxis, Shortness Of Breath and Other (See Comments)    Respiratory distress  Pt states she has had Toradol several times without any reactions   Bee Venom Anaphylaxis   Sulfa Antibiotics Anaphylaxis   Nsaids Other (See Comments)    History of gastric bypass   Other Other (See Comments)    No blood products.  Patient did  Request that only albumin or albumin-containing products may be administered   Repatha [Evolocumab] Other (See Comments)    Unknown reaction    Consultations: Neurology, cardiology   Procedures/Studies: VAS [place] LOWER EXTREMITY VENOUS (DVT)  Result Date: 03/23/2023  Lower Venous DVT Study Patient Name:  Caroline Williams  Date of Exam:   03/23/2023 Medical Rec #: [id number]        Accession #:    [id number] Date of Birth: 26-Jun-1960        Patient Gender: F Patient Age:   62 years Exam Location:  [facility] Procedure:      VAS [place] LOWER EXTREMITY VENOUS (DVT) Referring Phys: Gerri Spore O'NEAL --------------------------------------------------------------------------------  Indications: Pulmonary embolism.  Risk Factors: None identified. Limitations: Poor  ultrasound/tissue interface. Comparison Study: No prior study Performing Technologist: Shona Simpson  Examination Guidelines: A complete evaluation includes B-mode imaging, spectral Doppler, color Doppler, and power Doppler as needed of all accessible portions of each vessel. Bilateral testing is considered an integral part of a complete examination. Limited examinations for reoccurring indications may be performed as noted. The reflux portion of the exam is performed with the patient in reverse Trendelenburg.  +---------+---------------+---------+-----------+----------+-----------------+ RIGHT    CompressibilityPhasicitySpontaneityPropertiesThrombus Aging    +---------+---------------+---------+-----------+----------+-----------------+ CFV      Full           Yes      Yes                                    +---------+---------------+---------+-----------+----------+-----------------+ SFJ      Full                                                           +---------+---------------+---------+-----------+----------+-----------------+ FV Prox  Partial                 Yes                  Age Indeterminate +---------+---------------+---------+-----------+----------+-----------------+ FV Mid   Partial                 Yes                  Age Indeterminate +---------+---------------+---------+-----------+----------+-----------------+ FV DistalPartial                 Yes                  Age Indeterminate +---------+---------------+---------+-----------+----------+-----------------+ PFV      Full                                                           +---------+---------------+---------+-----------+----------+-----------------+

## 2023-03-29 NOTE — Progress Notes (Addendum)
Rounding Note    Patient Name: Caroline Williams Date of Encounter: 03/29/2023  [facility] Cardiologist: Orbie Pyo, MD   Subjective   Sitting up in chair. Reports she did not sleep well overnight. Indicates pain in her left chest when she takes a deep breath. On RA.   Inpatient Medications    Scheduled Meds:  brexpiprazole  2 mg Oral Daily   cilostazol  50 mg Oral BID   dapagliflozin propanediol  5 mg Oral Daily   doxepin  75 mg Oral QHS   fesoterodine  4 mg Oral Daily   levothyroxine  150 mcg Oral Q0600   metoprolol succinate  25 mg Oral Daily   morphine  15 mg Oral Q12H   pantoprazole  40 mg Oral Daily   Rivaroxaban  15 mg Oral BID WC   Followed by   Melene Muller ON 04/16/2023] rivaroxaban  20 mg Oral Q supper   rOPINIRole  2 mg Oral QHS   rosuvastatin  40 mg Oral Daily   sodium chloride flush  10 mL Intravenous Q12H   sodium chloride flush  3 mL Intravenous Once   umeclidinium-vilanterol  1 puff Inhalation Daily   vortioxetine HBr  20 mg Oral Daily   Continuous Infusions:  PRN Meds: acetaminophen **OR** acetaminophen, albuterol, ALPRAZolam, ondansetron **OR** ondansetron (ZOFRAN) IV, mouth rinse, oxyCODONE   Vital Signs    Vitals:   03/28/23 2001 03/29/23 0018 03/29/23 0403 03/29/23 0802  BP: 128/77 124/84 131/81 105/78  Pulse: 93 90 85 90  Resp: 17 18 18 18   Temp: 98.9 F (37.2 C) 98.3 F (36.8 C) 97.7 F (36.5 C) 98.2 F (36.8 C)  TempSrc: Oral Oral  Oral  SpO2: 95% 97% 96% 99%  Weight:      Height:        Intake/Output Summary (Last 24 hours) at 03/29/2023 0840 Last data filed at 03/28/2023 1000 Gross per 24 hour  Intake 472 ml  Output 550 ml  Net -78 ml      03/22/2023    3:00 PM 02/05/2023    8:37 AM 01/31/2023    9:38 AM  Last 3 Weights  Weight (lbs) 215 lb 215 lb 227 lb 4.7 oz  Weight (kg) 97.523 kg 97.523 kg 103.1 kg      Telemetry    SR 85-95 with occ PVC - Personally Reviewed   Physical Exam   GEN: adult female  sitting up in chair in no acute distress.   HEENT: normocephalic, non-icteric, no JVD Cardiac: RRR, no murmurs, rubs, or gallops.  Respiratory: unlabored on RA, clear to auscultation bilaterally. GI: Soft, nontender, non-distended  MS: No edema; No deformity. Neuro:  awake, expressive aphasia mixed with clear speech, moves all extremities but noted weakness on left  Psych: Normal affect   Labs    High Sensitivity Troponin:   Recent Labs  Lab 03/22/23 1729 03/22/23 1817  TROPONINIHS 1,100* 1,027*     Chemistry Recent Labs  Lab 03/22/23 1211 03/23/23 0517 03/26/23 0430 03/27/23 0917 03/28/23 0419 03/29/23 0443  NA 139 139   < > 135 134* 134*  K 3.0* 4.2   < > 3.9 4.1 4.0  CL 106 106   < > 106 104 103  CO2 22 17*   < > 21* 20* 23  GLUCOSE 117* 84   < > 118* 104* 88  BUN 17 14   < > 16 19 18   CREATININE 1.56* 1.53*   < > 1.47*  1.36* 1.37*  CALCIUM 8.3* 8.6*   < > 8.3* 8.3* 8.7*  MG 1.9 2.0  --   --   --   --   PROT 6.6  --   --   --   --   --   ALBUMIN 2.7*  --   --   --   --   --   AST 23  --   --   --   --   --   ALT 10  --   --   --   --   --   ALKPHOS 137*  --   --   --   --   --   BILITOT 0.8  --   --   --   --   --   GFRNONAA 37* 38*   < > 40* 44* 43*  ANIONGAP 11 16*   < > 8 10 8    < > = values in this interval not displayed.    Lipids  Recent Labs  Lab 03/22/23 1211  CHOL 204*  TRIG 79  HDL 67  LDLCALC 121*  CHOLHDL 3.0    Hematology Recent Labs  Lab 03/27/23 0917 03/28/23 0419 03/29/23 0443  WBC 6.0 5.3 4.9  RBC 3.54* 3.18* 3.00*  HGB 9.1* 8.5* 7.9*  HCT 30.4* 27.3* 25.3*  MCV 85.9 85.8 84.3  MCH 25.7* 26.7 26.3  MCHC 29.9* 31.1 31.2  RDW 16.5* 16.5* 16.4*  PLT 315 324 379   Thyroid  Recent Labs  Lab 03/22/23 1211  TSH 19.119*    BNP Recent Labs  Lab 03/22/23 1729  BNP 1,241.6*    DDimer No results for input(s): "DDIMER" in the last 168 hours.   Radiology    No results found.  Cardiac Studies   ECHO 03/22/23 > There is a  mobile clot/thrombus in transit sitting in the interatrial septum. Left ventricular ejection fraction, by estimation, is 40 to 45%. The left ventricle has mildly decreased function. The left ventricle demonstrates global hypokinesis. Left ventricular diastolic parameters are indeterminate. Right ventricular systolic function is normal. The right ventricular size is normal.  Thrombus noted in the left atrium. The mitral valve is normal in structure. No evidence of mitral valve regurgitation. No evidence of mitral stenosis. The aortic valve is normal in structure. Aortic valve regurgitation is not visualized. No aortic stenosis is present. There is borderline dilatation of the ascending aorta, measuring 36 mm. The inferior vena cava is normal in size with greater than 50% respiratory variability, suggesting right atrial pressure of 3 mmHg. Thrombus seen in the right atrium.  Carotid Duplex 03/22/23 > Right Carotid: Velocities in the right ICA are consistent with a 1-39%  stenosis. Left Carotid: Velocities in the left ICA are consistent with a 1-39% stenosis. Vertebrals: Bilateral vertebral arteries demonstrate antegrade flow. Subclavians: Normal flow hemodynamics were seen in bilateral subclavian arteries.   Patient Profile     62 y.o. 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 Admitted with acute CVA on 03/22/23. Cardiology consulted for concern for R/L atrial mass ultimately felt to be clot in transit with acute PE/DVT - stroke felt due to PE that crossed into left atrium. Also found to have elevated troponin (felt demand ischemia from PE/RV  strain), paroxysmal atrial tachycardia and acute HFmrEF.   Assessment & Plan    1. Acute CVA with  acute PE/DVT MRI of brain confirms patchy acute L MCA distribution infarction involving L frontal, parietal and temporal lobs. Small infarcts involving contralateral R frontal centrum semi ovale and L greater than R cerebellum. Etiology of stroke felt to be PE that crossed into LA. CTA of chest 10/8 showed PE, also showed clot in transit across interatrial septum from R atrium into L atrium; clot in transit confirmed on echo. Venous doppler 10/9 showed age indeterminate DVT in the R femoral vein, R posterior tibial veins and R peroneal veins -PCCM evaluation > felt not to be a candidate for lytics due to recent GI hemorrhage  -not a good candidate for surgery, recommendations for medical management  -initially started on Pletal + Plavix per Neuro, then transitioned to Pletal + Xarelto per pharmacy   2. Paroxysmal atrial tachycardia, also with history of frequent PVCs PTA was on Toprol 25mg  as OP then decreased to 12.5mg  over the summer due to dizziness, eventually fell off -continue Toprol 25 mg every day -TSH / synthroid mgmt per primary    3. Acute HFmrEF Echo with LVEF 40-45% with global H -no plan for invasive ischemic evaluation given recent clotting issues and fairly recent non-ischemic nuc, ? Stress mediated cardiomyopathy  -BB as above  -possible addition of ARB per MD 10/15 -no role for diuresis currently, assess for PRN dosing daily  -follow weights > ordered 10/14, re-ordered 10/15    4. H/o GI hemorrhage 01/2023, Anemia -per primary, Hgb range 8-9   5. Nonobstructive CAD: seen on previous coronary CT 09/2022 Nonischemic nuc 12/2022. Elevated troponin felt due to demand ischemia/RV strain -not on ASA due to plans for DOAC    6. CKD 3b  -per primary    Pt has Cardiology outpt follow up arranged for 10/30.   For questions or updates, please contact [facility] Please consult [email or web address] for contact info under      Signed, Canary Brim, NP  03/29/2023, 8:40 AM     History and all data above reviewed.  Patient examined.  I agree with the findings as above.  Frustrated with her aphasia but making improvement.  The patient exam reveals COR:RRR  ,  Lungs: Clear  ,  Abd: Positive bowel sounds, no rebound no guarding, Ext No edema  .  All available labs, radiology testing, previous records reviewed. Agree with documented assessment and plan.   CVA:    Secondary to thrombus as above.  Now on lifelong anticoagulation.  We have arranged follow up.  BP is still somewhat labile.  Hold off on ARB and can start this based on her BPs as an out patient.    Caroline Williams  1:51 PM  03/29/2023

## 2023-03-29 NOTE — Plan of Care (Signed)
Problem: Fluid Volume: Goal: Hemodynamic stability will improve Outcome: Progressing   Problem: Clinical Measurements: Goal: Diagnostic test results will improve Outcome: Progressing Goal: Signs and symptoms of infection will decrease Outcome: Progressing   Problem: Respiratory: Goal: Ability to maintain adequate ventilation will improve Outcome: Progressing   Problem: Education: Goal: Ability to verbalize activity precautions or restrictions will improve Outcome: Progressing Goal: Knowledge of the prescribed therapeutic regimen will improve Outcome: Progressing Goal: Understanding of discharge needs will improve Outcome: Progressing   Problem: Activity: Goal: Ability to avoid complications of mobility impairment will improve Outcome: Progressing Goal: Ability to tolerate increased activity will improve Outcome: Progressing Goal: Will remain free from falls Outcome: Progressing   Problem: Bowel/Gastric: Goal: Gastrointestinal status for postoperative course will improve Outcome: Progressing   Problem: Clinical Measurements: Goal: Ability to maintain clinical measurements within normal limits will improve Outcome: Progressing Goal: Postoperative complications will be avoided or minimized Outcome: Progressing Goal: Diagnostic test results will improve Outcome: Progressing   Problem: Pain Management: Goal: Pain level will decrease Outcome: Progressing   Problem: Skin Integrity: Goal: Will show signs of wound healing Outcome: Progressing   Problem: Health Behavior/Discharge Planning: Goal: Identification of resources available to assist in meeting health care needs will improve Outcome: Progressing   Problem: Bladder/Genitourinary: Goal: Urinary functional status for postoperative course will improve Outcome: Progressing   Problem: Education: Goal: Knowledge of disease or condition will improve Outcome: Progressing Goal: Knowledge of secondary prevention  will improve (MUST DOCUMENT ALL) Outcome: Progressing Goal: Knowledge of patient specific risk factors will improve Loraine Leriche N/A or DELETE if not current risk factor) Outcome: Progressing   Problem: Ischemic Stroke/TIA Tissue Perfusion: Goal: Complications of ischemic stroke/TIA will be minimized Outcome: Progressing   Problem: Coping: Goal: Will verbalize positive feelings about self Outcome: Progressing Goal: Will identify appropriate support needs Outcome: Progressing   Problem: Health Behavior/Discharge Planning: Goal: Ability to manage health-related needs will improve Outcome: Progressing Goal: Goals will be collaboratively established with patient/family Outcome: Progressing   Problem: Self-Care: Goal: Ability to participate in self-care as condition permits will improve Outcome: Progressing Goal: Verbalization of feelings and concerns over difficulty with self-care will improve Outcome: Progressing Goal: Ability to communicate needs accurately will improve Outcome: Progressing   Problem: Nutrition: Goal: Risk of aspiration will decrease Outcome: Progressing Goal: Dietary intake will improve Outcome: Progressing   Problem: Education: Goal: Knowledge of General Education information will improve Description: Including pain rating scale, medication(s)/side effects and non-pharmacologic comfort measures Outcome: Progressing   Problem: Health Behavior/Discharge Planning: Goal: Ability to manage health-related needs will improve Outcome: Progressing   Problem: Clinical Measurements: Goal: Ability to maintain clinical measurements within normal limits will improve Outcome: Progressing Goal: Will remain free from infection Outcome: Progressing Goal: Diagnostic test results will improve Outcome: Progressing Goal: Respiratory complications will improve Outcome: Progressing Goal: Cardiovascular complication will be avoided Outcome: Progressing   Problem:  Activity: Goal: Risk for activity intolerance will decrease Outcome: Progressing   Problem: Nutrition: Goal: Adequate nutrition will be maintained Outcome: Progressing   Problem: Coping: Goal: Level of anxiety will decrease Outcome: Progressing   Problem: Elimination: Goal: Will not experience complications related to bowel motility Outcome: Progressing Goal: Will not experience complications related to urinary retention Outcome: Progressing   Problem: Pain Managment: Goal: General experience of comfort will improve Outcome: Progressing   Problem: Safety: Goal: Ability to remain free from injury will improve Outcome: Progressing   Problem: Skin Integrity: Goal: Risk for impaired skin integrity will decrease Outcome: Progressing

## 2023-03-29 NOTE — Progress Notes (Signed)
Occupational Therapy Treatment Patient Details Name: Caroline Williams MRN: [id number] DOB: 08-26-1960 Today's Date: 03/29/2023   History of present illness Pt is a 62 y/o female presenting to [facility] hospital on 03/21/2023 with aphasia. MRI demonstrates bilateral acute ischemic infarcts, suggestive of cardioembolic source. Pt was recently discharged 10/3 after management of ischemic colitis. PMH includes: L4-5 PLIF 7/31, anxiety, arthritis, bipolar 1, CAD, COPD, depression, DM, HTN, hernia repair, R TKR.   OT comments  Pt making gradual progress towards OT goals. Focused session on tasks of ordering breakfast given communication deficits, UB ADLs sitting EOB and transfer OOB to chair. Overall, pt requires Min-Mod A for completion of ADLs/transfers assessed today. Pt limited by poor safety awareness, impaired problem solving and chronic R knee pain (provided heat pack at end of session). Continue to recommend inpatient follow up therapy, <3 hours/day at DC.      If plan is discharge home, recommend the following:  Assist for transportation;Assistance with cooking/housework;A lot of help with walking and/or transfers;A lot of help with bathing/dressing/bathroom;Direct supervision/assist for financial management;Direct supervision/assist for medications management   Equipment Recommendations  BSC/3in1;Tub/shower seat;Wheelchair (measurements OT);Wheelchair cushion (measurements OT)    Recommendations for Other Services      Precautions / Restrictions Precautions Precautions: Fall Restrictions Weight Bearing Restrictions: No       Mobility Bed Mobility Overal bed mobility: Needs Assistance Bed Mobility: Supine to Sit     Supine to sit: Mod assist, Used rails, HOB elevated     General bed mobility comments: extended time needed to cue safe sequencing with pt saying "wait a minute" throughout. assist for RLE to EOB and cues to use bedrail, handheld assist to lift trunk     Transfers Overall transfer level: Needs assistance Equipment used: 1 person hand held assist Transfers: Bed to chair/wheelchair/BSC            Lateral/Scoot Transfers: Min assist General transfer comment: mix of squat pivot/lateral scooting to recliner on R side. able to stand/clear bottom with Min A but difficulty sustaining WB due to R knee pain     Balance Overall balance assessment: Needs assistance Sitting-balance support: No upper extremity supported, Feet supported, Single extremity supported Sitting balance-Leahy Scale: Poor Sitting balance - Comments: leaning on to L UE/elbow   Standing balance support: Bilateral upper extremity supported Standing balance-Leahy Scale: Poor                             ADL either performed or assessed with clinical judgement   ADL Overall ADL's : Needs assistance/impaired Eating/Feeding: Sitting;Independent Eating/Feeding Details (indicate cue type and reason): in chair, able to open fruit cup and eat Grooming: Contact guard assist;Sitting;Wash/dry face Grooming Details (indicate cue type and reason): sitting EOB. L lateral lean (purposeful due to presumed discomfort) Upper Body Bathing: Minimal assistance;Sitting Upper Body Bathing Details (indicate cue type and reason): assist for back EOB         Lower Body Dressing: Maximal assistance;Sitting/lateral leans Lower Body Dressing Details (indicate cue type and reason): doffing mesh underwear that were around hips, indentations noted.               General ADL Comments: Assisted to order breakfast w/ cues/suggestions provided by OT. pt able to answer yes/no if she wanted [place] sausage, was able to clearly state lemonade but pointing to other food items on menu    Extremity/Trunk Assessment Upper Extremity Assessment Upper Extremity Assessment: Overall [facility]  for tasks assessed;Right hand dominant   Lower Extremity Assessment Lower Extremity Assessment: Defer to  PT evaluation        Vision   Vision Assessment?: No apparent visual deficits   Perception     Praxis      Cognition Arousal: Alert Behavior During Therapy: Restless, Impulsive Overall Cognitive Status: Difficult to assess                                 General Comments: difficult to assess due to expressive aphasia. able to follow commands, uses gestures to express needs (pointing to chest to indicate desire for new gown). poor safety awareness and problem solving, needing cues for sequencing, balance correction        Exercises      Shoulder Instructions       General Comments      Pertinent Vitals/ Pain       Pain Assessment Pain Assessment: Faces Faces Pain Scale: Hurts even more Pain Location: R knee, back "a little" Pain Descriptors / Indicators: Grimacing, Guarding, Moaning Pain Intervention(s): Monitored during session, Limited activity within patient's tolerance, Patient requesting pain meds-RN notified, Heat applied (heat pack to R knee)  Home Living                                          Prior Functioning/Environment              Frequency  Min 1X/week        Progress Toward Goals  OT Goals(current goals can now be found in the care plan section)  Progress towards OT goals: Progressing toward goals  Acute Rehab OT Goals OT Goal Formulation: Patient unable to participate in goal setting Time For Goal Achievement: 04/05/23 Potential to Achieve Goals: Fair ADL Goals Pt Will Perform Grooming: with contact guard assist;standing Pt Will Perform Upper Body Dressing: with min assist;sitting Pt Will Perform Lower Body Dressing: with mod assist;sit to/from stand Pt Will Transfer to Toilet: with contact guard assist;stand pivot transfer;bedside commode  Plan      Co-evaluation                 AM-PAC OT "6 Clicks" Daily Activity     Outcome Measure   Help from another person eating meals?: None Help  from another person taking care of personal grooming?: A Little Help from another person toileting, which includes using toliet, bedpan, or urinal?: A Lot Help from another person bathing (including washing, rinsing, drying)?: A Lot Help from another person to put on and taking off regular upper body clothing?: A Lot Help from another person to put on and taking off regular lower body clothing?: A Lot 6 Click Score: 15    End of Session    OT Visit Diagnosis: Unsteadiness on feet (R26.81);Muscle weakness (generalized) (M62.81)   Activity Tolerance Patient tolerated treatment well;Patient limited by pain   Patient Left in chair;with call bell/phone within reach;with chair alarm set   Nurse Communication Mobility status        Time: 5409-8119 OT Time Calculation (min): 29 min  Charges: OT General Charges $OT Visit: 1 Visit OT Treatments $Self Care/Home Management : 8-22 mins $Therapeutic Activity: 8-22 mins  Bradd Canary, OTR/L Acute Rehab Services Office: [phone]   Lorre Munroe 03/29/2023, 8:02 AM

## 2023-03-30 NOTE — Consult Note (Signed)
[facility]  [facility] Inpatient Consult   03/30/2023  KARLINA SUARES 14-Jul-1960 [id number]  [facility] [[facility]]  Accountable Care Organization [ACO] Patient: [employer] [facility] SNP]  Primary Care Provider:  Hoy Register, MD   Review of patient's medical record for past medical history and membership affiliate roster reveals this Patient is found to be in a [employer] SNP [Special Needs Program] plan. This plan provides care management for the member.    Patient transitioned to SNF.  For additional questions or referrals please contact:    Plan: Will sign off.    Charlesetta Shanks, RN, BSN, CCM [employer], [facility] Liaison Direct Dial: [phone] or secure chat Website: [email or web address]

## 2023-03-31 LAB — FACTOR 5 LEIDEN

## 2023-04-01 ENCOUNTER — Telehealth: Payer: Self-pay | Admitting: Family Medicine

## 2023-04-01 NOTE — Telephone Encounter (Signed)
[facility] called for an update on the RX for test strips and lancets. These were last filled in 2022.  Pt has appt 2023-04-19.  This will be reviewed at that time.  I advised [name] this should be refilled after the appt.

## 2023-04-06 ENCOUNTER — Emergency Department (HOSPITAL_COMMUNITY)
Admission: EM | Admit: 2023-04-06 | Discharge: 2023-04-15 | Disposition: E | Payer: Medicare HMO | Attending: Emergency Medicine | Admitting: Emergency Medicine

## 2023-04-06 ENCOUNTER — Ambulatory Visit: Payer: Medicare HMO | Admitting: Physician Assistant

## 2023-04-06 ENCOUNTER — Encounter (HOSPITAL_COMMUNITY): Payer: Self-pay

## 2023-04-06 DIAGNOSIS — E785 Hyperlipidemia, unspecified: Secondary | ICD-10-CM | POA: Diagnosis not present

## 2023-04-06 DIAGNOSIS — I469 Cardiac arrest, cause unspecified: Secondary | ICD-10-CM | POA: Insufficient documentation

## 2023-04-06 DIAGNOSIS — R799 Abnormal finding of blood chemistry, unspecified: Secondary | ICD-10-CM | POA: Insufficient documentation

## 2023-04-06 DIAGNOSIS — R55 Syncope and collapse: Secondary | ICD-10-CM | POA: Diagnosis not present

## 2023-04-06 DIAGNOSIS — F99 Mental disorder, not otherwise specified: Secondary | ICD-10-CM | POA: Diagnosis not present

## 2023-04-06 DIAGNOSIS — Z7901 Long term (current) use of anticoagulants: Secondary | ICD-10-CM | POA: Diagnosis not present

## 2023-04-06 DIAGNOSIS — L89023 Pressure ulcer of left elbow, stage 3: Secondary | ICD-10-CM | POA: Diagnosis not present

## 2023-04-06 DIAGNOSIS — E1140 Type 2 diabetes mellitus with diabetic neuropathy, unspecified: Secondary | ICD-10-CM | POA: Diagnosis not present

## 2023-04-06 DIAGNOSIS — K921 Melena: Secondary | ICD-10-CM | POA: Diagnosis not present

## 2023-04-06 DIAGNOSIS — I471 Supraventricular tachycardia, unspecified: Secondary | ICD-10-CM | POA: Diagnosis not present

## 2023-04-06 LAB — CBG MONITORING, ED: Glucose-Capillary: 203 mg/dL — ABNORMAL HIGH (ref 70–99)

## 2023-04-06 MED ORDER — EPINEPHRINE 1 MG/10ML IJ SOSY
PREFILLED_SYRINGE | INTRAMUSCULAR | Status: AC | PRN
  Administered 2023-04-06: 1 mg via INTRAVENOUS

## 2023-04-13 ENCOUNTER — Ambulatory Visit: Payer: Medicare HMO | Admitting: Physician Assistant

## 2023-04-15 NOTE — Code Documentation (Signed)
EDP did not have any noted cardiac activity with ultrasound, TOD called at 1641

## 2023-04-15 NOTE — ED Notes (Signed)
Pt transported to morgue. Pt placement made aware.

## 2023-04-15 NOTE — ED Provider Notes (Signed)
[place] EMERGENCY DEPARTMENT AT [facility] Provider Note   CSN: [id number] Arrival date & time: 04-07-2023  1633     History {Add pertinent medical, surgical, social history, OB history to HPI:1} Chief Complaint  Patient presents with   CPR    Caroline Williams is a 62 y.o. female.  She presents by EMS from a nursing facility after they were called for GI bleeding.  Apparently she was vomiting coffee-ground material and having melanotic stools.  She had a cardiac arrest at the scene, airway was established by EMS and CPR begun.  They did about 35 minutes of CPR including multiple rounds of medications.  She has been in PEA rhythm without pulses.  She did briefly regain pulses during transport for about 5 or 10 minutes but then lost pulses again.  She presents with CPR ongoing.  The history is provided by the EMS personnel.  Cardiac Arrest Witnessed by:  Healthcare provider Incident location:  Nursing home Time since incident:  1 hour Time before ALS initiated:  Immediate Condition upon EMS arrival:  Unresponsive Pulse:  Absent Initial cardiac rhythm per EMS:  PEA Treatments prior to arrival:  ACLS protocol, intubation and vascular access Medications given prior to ED:  Epinephrine IV access type:  Intraosseous Airway:  Combitube Rhythm on admission to ED:  PEA Associated symptoms: vomiting        Home Medications Prior to Admission medications   Medication Sig Start Date End Date Taking? Authorizing Provider  albuterol (VENTOLIN HFA) 108 (90 Base) MCG/ACT inhaler INHALE 2 PUFFS INTO THE LUNGS EVERY 6 HOURS AS NEEDED FOR WHEEZING OR SHORTNESS OF BREATH Patient taking differently: Inhale 2 puffs into the lungs every 6 (six) hours as needed for wheezing or shortness of breath. 09/23/21   Hoy Register, MD  Alirocumab (PRALUENT) 150 MG/ML SOAJ INJECT 1 PEN INTO SKIN EVERY 14 DAYS. Patient taking differently: Inject 150 mg as directed See admin instructions. INJECT 1  PEN INTO SKIN EVERY 14 DAYS. 12/03/22   Tereso Newcomer T, PA-C  ALPRAZolam Prudy Feeler) 1 MG tablet Take 1 tablet (1 mg total) by mouth 3 (three) times daily as needed for anxiety. 03/29/23   Burnadette Pop, MD  amphetamine-dextroamphetamine (ADDERALL) 20 MG tablet Take 1 tablet (20 mg total) by mouth 3 (three) times daily. 03/29/23   Burnadette Pop, MD  Blood Glucose Monitoring Suppl (ACCU-CHEK GUIDE) w/Device KIT 1 kit by Does not apply route 3 (three) times daily. To check blood sugars 03/25/23   Hoy Register, MD  cilostazol (PLETAL) 50 MG tablet Take 1 tablet (50 mg total) by mouth 2 (two) times daily. 03/29/23   Burnadette Pop, MD  cyclobenzaprine (FLEXERIL) 10 MG tablet Take 1 tablet (10 mg total) by mouth 3 (three) times daily as needed for muscle spasms. 01/14/23   Tressie Stalker, MD  diclofenac Sodium (VOLTAREN) 1 % GEL Apply 2-4 g topically 2 (two) times daily as needed (wrist/knee pain). 11/23/21   Hoy Register, MD  docusate sodium (COLACE) 100 MG capsule Take 1 capsule (100 mg total) by mouth 2 (two) times daily. 01/14/23   Tressie Stalker, MD  doxepin (SINEQUAN) 75 MG capsule Take 75 mg by mouth at bedtime. 03/02/18   [provider]  FARXIGA 5 MG TABS tablet TAKE ONE TABLET BY MOUTH DAILY BEFORE BREAKFAST Patient taking differently: Take 5 mg by mouth daily. 03/17/23   Hoy Register, MD  fluticasone (FLONASE) 50 MCG/ACT nasal spray SHAKE LIQUID AND USE TWO SPRAYS IN EACH NOSTRIL  DAILY Patient taking differently: Place 2 sprays into both nostrils daily as needed for allergies. 12/21/22   Hoy Register, MD  glucose blood (ACCU-CHEK GUIDE) test strip Use as instructed Patient taking differently: 1 each by Other route See admin instructions. Use as instructed 04/10/21   Hoy Register, MD  Lancets (ACCU-CHEK MULTICLIX) lancets Use as instructed Patient taking differently: 1 each by Other route See admin instructions. Use as instructed 04/10/21   Hoy Register, MD  levothyroxine  (SYNTHROID) 150 MCG tablet TAKE 1 TABLET(150 MCG) BY MOUTH DAILY BEFORE BREAKFAST 03/18/23   Hoy Register, MD  linaclotide (LINZESS) 290 MCG CAPS capsule Take 290 mcg by mouth daily as needed (constipation).    [provider]  metoprolol succinate (TOPROL-XL) 25 MG 24 hr tablet Take 1 tablet (25 mg total) by mouth daily. 03/30/23   Burnadette Pop, MD  morphine (MS CONTIN) 15 MG 12 hr tablet Take 1 tablet (15 mg total) by mouth every 12 (twelve) hours. 03/29/23   Burnadette Pop, MD  nabumetone (RELAFEN) 750 MG tablet Take 750 mg by mouth 2 (two) times daily.    [provider]  naloxone [facility]) nasal spray 4 mg/0.1 mL Place 1 spray into the nose as needed (overdose). 05/22/20   [provider]  nitroGLYCERIN (NITROSTAT) 0.4 MG SL tablet DISSOLVE 1 TABLET UNDER THE TONGUE EVERY 5 MINUTES AS NEEDED Patient taking differently: Place 0.4 mg under the tongue See admin instructions. DISSOLVE 1 TABLET UNDER THE TONGUE EVERY 5 MINUTES AS NEEDED 12/03/22   Tereso Newcomer T, PA-C  omeprazole (PRILOSEC OTC) 20 MG tablet Take 20 mg by mouth daily.    [provider]  ondansetron (ZOFRAN) 4 MG tablet Take 1 tablet (4 mg total) by mouth every 6 (six) hours as needed for nausea. 02/08/23   Pokhrel, Rebekah Chesterfield, MD  oxyCODONE (OXY IR/ROXICODONE) 5 MG immediate release tablet Take 1 tablet (5 mg total) by mouth every 4 (four) hours as needed for moderate pain (pain score 4-6). 03/29/23   Burnadette Pop, MD  REXULTI 2 MG TABS tablet Take 2 mg by mouth daily.    [provider]  Rivaroxaban (XARELTO) 15 MG TABS tablet Take 1 tablet (15 mg total) by mouth 2 (two) times daily with a meal for 18 days. 03/29/23 04/16/23  Burnadette Pop, MD  rivaroxaban (XARELTO) 20 MG TABS tablet Take 1 tablet (20 mg total) by mouth daily with supper. 04/16/23   Burnadette Pop, MD  rOPINIRole (REQUIP) 2 MG tablet Take 1 tablet (2 mg total) by mouth at bedtime. 02/08/23 05/21/23  Pokhrel, Rebekah Chesterfield, MD   rosuvastatin (CRESTOR) 40 MG tablet Take 1 tablet (40 mg total) by mouth daily. 03/30/23   Burnadette Pop, MD  solifenacin (VESICARE) 5 MG tablet TAKE ONE TABLET BY MOUTH DAILY 03/17/23   Hoy Register, MD  umeclidinium-vilanterol (ANORO ELLIPTA) 62.5-25 MCG/ACT AEPB Inhale 1 puff into the lungs daily. 03/30/23   Burnadette Pop, MD  Vitamin D, Ergocalciferol, (DRISDOL) 1.25 MG (50000 UNIT) CAPS capsule Take 1 capsule (50,000 Units total) by mouth every 7 (seven) days. 02/09/23   Pokhrel, Rebekah Chesterfield, MD  escitalopram (LEXAPRO) 20 MG tablet Take 20 mg by mouth daily.  12/14/18  [provider]  LATUDA 120 MG TABS Take 120 mg by mouth at bedtime.  03/04/18 12/14/18  [provider]  LISINOPRIL PO Take by mouth daily.    09/06/11  [provider]  metoCLOPramide (REGLAN) 10 MG tablet Take 1 tablet (10 mg total) by mouth every 8 (  eight) hours as needed for nausea. Patient not taking: Reported on 12/06/2019 06/30/18 05/12/20  Mesner, Barbara Cower, MD      Allergies    Asa [aspirin], Bee venom, Sulfa antibiotics, Nsaids, Other, and Repatha [evolocumab]    Review of Systems   Review of Systems  Unable to perform ROS: Patient unresponsive  Gastrointestinal:  Positive for vomiting.    Physical Exam Updated Vital Signs There were no vitals taken for this visit. Physical Exam Vitals and nursing note reviewed.  Constitutional:      Appearance: She is well-developed.  HENT:     Head: Normocephalic and atraumatic.     Mouth/Throat:     Comments: Orally intubated Eyes:     Conjunctiva/sclera: Conjunctivae normal.     Comments: Pupils fixed and dilated  Cardiovascular:     Comments: No pulses Pulmonary:     Comments: Equal breath sounds by Ambu Abdominal:     Palpations: Abdomen is soft. There is no mass.     Tenderness: There is no abdominal tenderness.  Musculoskeletal:        General: No deformity.  Skin:    Comments: Cool  Neurological:     Comments: No spontaneous  activity no respiratory effort no corneal reflex     ED Results / Procedures / Treatments   Labs (all labs ordered are listed, but only abnormal results are displayed) Labs Reviewed - No data to display  EKG None  Radiology No results found.  Procedures Procedures  {Document cardiac monitor, telemetry assessment procedure when appropriate:1}  Medications Ordered in ED Medications - No data to display  ED Course/ Medical Decision Making/ A&P   {   Click here for ABCD2, HEART and other calculatorsREFRESH Note before signing :1}                              Medical Decision Making  This patient complains of ***; this involves an extensive number of treatment Options and is a complaint that carries with it a high risk of complications and morbidity. The differential includes ***  I ordered, reviewed and interpreted labs, which included *** I ordered medication *** and reviewed PMP when indicated. I ordered imaging studies which included *** and I independently    visualized and interpreted imaging which showed *** Additional history obtained from *** Previous records obtained and reviewed *** I consulted *** and discussed lab and imaging findings and discussed disposition.  Cardiac monitoring reviewed, *** Social determinants considered, *** Critical Interventions: ***  After the interventions stated above, I reevaluated the patient and found *** Admission and further testing considered, ***   {Document critical care time when appropriate:1} {Document review of labs and clinical decision tools ie heart score, Chads2Vasc2 etc:1}  {Document your independent review of radiology images, and any outside records:1} {Document your discussion with family members, caretakers, and with consultants:1} {Document social determinants of health affecting pt's care:1} {Document your decision making why or why not admission, treatments were needed:1} Final Clinical Impression(s) / ED  Diagnoses Final diagnoses:  None    Rx / DC Orders ED Discharge Orders     None

## 2023-04-15 NOTE — Code Documentation (Signed)
[id number] [facility] contacted.

## 2023-04-15 NOTE — Code Documentation (Signed)
EDP using ultrasound, checking for cardiac activity.

## 2023-04-15 NOTE — Code Documentation (Signed)
Patient time of death occurred at 30.

## 2023-04-15 NOTE — Code Documentation (Signed)
EDP using ultrasound checking cardiac activity

## 2023-04-15 NOTE — Code Documentation (Signed)
Family in consultation room at this time

## 2023-04-15 NOTE — ED Notes (Signed)
Eyes prepped with saline for possible donation.

## 2023-04-15 NOTE — ED Triage Notes (Signed)
Pt arrived/roomed at 1633 via [facility] from SNF, CPR in progress, EMS reports PT had a massive GI bleed, and staff at facility started CPR, EMS arrived 7 min after asystole had begun, received 35 full minutes of CPR and 9 epis en route to ER thru IO in L leg. EMS reports ROSC at 1604, pressure 140/80, no breathing on her own. Hijel airway in place. Lost pulses again at 1626. Upon arrival pulse check at 1633, PEA, epi given at 1634.

## 2023-04-15 NOTE — Progress Notes (Deleted)
Patient ID: Caroline Williams, female   DOB: 01-08-1961, 62 y.o.   MRN: [id number]   After hospitalization 10/7 to 03/29/2023 with code stroke Admit date: 03/21/2023 Discharge date: 03/29/2023   Admitted From: Home Disposition:  SNF   Discharge Condition:Stable CODE STATUS:FULL Diet recommendation: Heart Healthy   Brief/Interim Summary:   Patient is a 62 year old female with history of anxiety, bipolar 1 disorder, coronary artery disease, COPD, depression, diabetes type 2, GERD, hypothyroidism, hypertension who presented with inability to speak.  MRA/MRI brain shows patchy acute left MCA distribution infarct involving left frontal, parietal, temporal lobes without significant mass effect or hemorrhage.  Neurology was following.  Hospital course also remarkable for finding of bilateral submassive PE with CT evidence of right heart strain.  Cardiology, PCCM consulted, no plan for intervention.  Currently on Xarelto.  PT/OT recommending SNF.  Medically stable for discharge whenever possible.    Following problems were addressed during the hospitalization:   Acute CVA: Presented with aphasia.  Neurology consulted.  MRA/MRI brain shows patchy acute left MCA distribution infarct involving left frontal, parietal, temporal lobes without significant mass effect or hemorrhage.  Neurology was following.  Stroke workup completed.  A1c 5.3, LDL of 139.  Currently on Pletal, Xarelto, statin.  PT/OT recommending SNF on discharge.  TOC following.Has significant dysarthria.  Has significant weakness of right lower extremity. She needs to follow-up with neurology as an outpatient.   Bilateral submassive PE with acute cor pulmonale: CTA positive for bilateral submassive PE with right ventricular strain.  Echocardiogram with concern of clot in transit.  Not a candidate for embolectomy.  PCCM, cardiology, neurology, IR.  Currently on Xarelto.  Patient is not a candidate for thrombolytic therapy.  Currently stable on  room air.  Lower extremity Doppler showed age-indeterminate DVT.   Paroxysmal SVT: Had an episode of SVT, currently resolved.  On metoprolol.   Acute systolic CHF: Echo showed EF of 40 to 45%.  Currently euvolemic.  Cardiology was following.  We recommend to follow-up with cardiology as an outpatient   Normocytic anemia: Chronic, stable   History of GI bleed: Currently hemoglobin stable.  Monitor intermittently,check CBC in a week   GERD: Continue PPI   Diabetes type 2: A1c 5.3.     Hypertension: Currently blood pressure stable.  On ARB, hydrochlorothiazide, Aldactone, Imdur at home, currently on hold due to soft blood pressure   Chronic low back pain: History of L4-L5 decompression, fusion.  Continue supportive care, pain management   Hypothyroidism: Continue Synthyroid.  TSH elevated.  Check TFT in 4 to 6 weeks as an outpatient   COPD: Currently stable not on exacerbation.  On room air.  On Anoro, albuterol   Hyperlipidemia: Continue statin.  LDL of 139   AKI on CKD stage IIIa: Baseline creatinine around 1.2.  Currently kidney function at baseline.   Anxiety/depression:Continue Rexulti, Xanax and doxepin    Goals of care: Goals of care discussed with family.  Currently remains full code.   Discharge Diagnoses:  Principal Problem:   Acute CVA (cerebrovascular accident) (HCC) Active Problems:   Pulmonary embolism with acute cor pulmonale (HCC)   Acute clinical systolic heart failure (HCC)   Paroxysmal SVT (supraventricular tachycardia) (HCC)   GERD (gastroesophageal reflux disease)   History of GI bleed   Diabetes mellitus with neuropathy (HCC)   Essential (primary) hypertension   Chronic low back pain   Hypothyroidism   COPD mixed type (HCC)   Hyperlipidemia   AKI (acute kidney injury) (HCC)  Psychiatric disorder

## 2023-04-15 DEATH — deceased

## 2023-05-27 ENCOUNTER — Ambulatory Visit: Payer: Medicare HMO | Admitting: Internal Medicine

## 2023-09-15 ENCOUNTER — Other Ambulatory Visit (HOSPITAL_COMMUNITY): Payer: Self-pay
